# Patient Record
Sex: Male | Born: 1960 | Race: White | Hispanic: No | Marital: Married | Smoking: Former smoker
Health system: Southern US, Community
[De-identification: ages and names within clinical notes are randomized; demographics above are authoritative.]

## PROBLEM LIST (undated history)

## (undated) DIAGNOSIS — F419 Anxiety disorder, unspecified: Secondary | ICD-10-CM

## (undated) DIAGNOSIS — F32A Depression, unspecified: Secondary | ICD-10-CM

## (undated) DIAGNOSIS — F329 Major depressive disorder, single episode, unspecified: Secondary | ICD-10-CM

## (undated) DIAGNOSIS — I10 Essential (primary) hypertension: Secondary | ICD-10-CM

## (undated) DIAGNOSIS — R454 Irritability and anger: Secondary | ICD-10-CM

## (undated) HISTORY — DX: Depression, unspecified: F32.A

## (undated) HISTORY — PX: APPENDECTOMY: SHX54

## (undated) HISTORY — PX: TONSILLECTOMY: SUR1361

## (undated) HISTORY — DX: Irritability and anger: R45.4

## (undated) HISTORY — DX: Anxiety disorder, unspecified: F41.9

## (undated) HISTORY — DX: Major depressive disorder, single episode, unspecified: F32.9

## (undated) HISTORY — PX: COSMETIC SURGERY: SHX468

---

## 2010-11-25 ENCOUNTER — Encounter: Payer: Self-pay | Admitting: Family Medicine

## 2010-11-25 ENCOUNTER — Ambulatory Visit
Admission: RE | Admit: 2010-11-25 | Discharge: 2010-11-25 | Payer: Self-pay | Source: Home / Self Care | Admitting: Family Medicine

## 2010-11-25 DIAGNOSIS — I10 Essential (primary) hypertension: Secondary | ICD-10-CM | POA: Insufficient documentation

## 2010-11-27 ENCOUNTER — Telehealth (INDEPENDENT_AMBULATORY_CARE_PROVIDER_SITE_OTHER): Payer: Self-pay | Admitting: *Deleted

## 2010-12-05 NOTE — Progress Notes (Signed)
  Phone Note Outgoing Call Call back at [facility] Phone [phone]   Call placed by: Lajean Saver RN,  November 27, 2010 2:31 PM Call placed to: Patient Summary of Call: Callback: No answer. Message left with reason for call and call back with any questions or concerns.

## 2010-12-05 NOTE — Assessment & Plan Note (Signed)
Summary: SOB/CHEST PAIN (rm 5)   Vital Signs:  Patient Profile:   50 Years Old Male CC:      intermittent SOB x 2 weeks , right sided chest disomfort x this AM Height:     76 inches Weight:      283 pounds O2 Sat:      95 % O2 treatment:    Room Air Temp:     98.8 degrees F oral Pulse rate:   96 / minute Resp:     18 per minute BP sitting:   165 / 98  (left arm) Cuff size:   large  Vitals Entered By: Lajean Saver RN (November 25, 2010 9:59 AM)                  Updated Prior Medication List: FISH OIL 1000 MG CAPS (OMEGA-3 FATTY ACIDS) once daily MULTIVITAMINS  TABS (MULTIPLE VITAMIN)   Current Allergies: No known allergies History of Present Illness Chief Complaint: intermittent SOB x 2 weeks , right sided chest disomfort x this AM History of Present Illness:  Subjective:  Patient complains of 2 week history of vague right sided anterior chest discomfort that is most apparent in the morning after he does a cardio workout at home.  He describes the discomfort as a restriction when inhaling, almost like "breathing cold air."  No shortness of breath, however, and the discomfort does not limiti his activity.  No recent URI, and no trauma to the chest.  No cough.  No fevers, chills, and sweats.  No GI or GU symptoms.   Quit smoking about 5 years ago. Past history of pneumonia about 10 years ago.  No pain with physical activity.  REVIEW OF SYSTEMS Constitutional Symptoms      Denies fever, chills, night sweats, weight loss, weight gain, and fatigue.  Eyes       Denies change in vision, eye pain, eye discharge, glasses, contact lenses, and eye surgery. Ear/Nose/Throat/Mouth       Denies hearing loss/aids, change in hearing, ear pain, ear discharge, dizziness, frequent runny nose, frequent nose bleeds, sinus problems, sore throat, hoarseness, and tooth pain or bleeding.  Respiratory       Complains of shortness of breath.      Denies dry cough, productive cough, wheezing, asthma,  bronchitis, and emphysema/COPD.  Cardiovascular       Complains of chest pain.      Denies murmurs and tires easily with exhertion.    Gastrointestinal       Denies stomach pain, nausea/vomiting, diarrhea, constipation, blood in bowel movements, and indigestion. Genitourniary       Denies painful urination, kidney stones, and loss of urinary control. Neurological       Denies paralysis, seizures, and fainting/blackouts. Musculoskeletal       Denies muscle pain, joint pain, joint stiffness, decreased range of motion, redness, swelling, muscle weakness, and gout.  Skin       Denies bruising, unusual mles/lumps or sores, and hair/skin or nail changes.  Psych       Denies mood changes, temper/anger issues, anxiety/stress, speech problems, depression, and sleep problems. Other Comments: SOB intermittent x 2 weeks. Chest discomfort x this AM, "Chest pain feels like im breathing in cold air"   Past History:  Past Medical History: Hx of PNA  Past Surgical History: Appendectomy Tonsillectomy  Family History: Family History Breast cancer 1st degree relative <50- mother Family History of Stroke M 1st degree relative <50- father  Social History:  Occupation: Tobacco sales rep Married Alcohol use-no Drug use-no 2 grown children Quit smoking 5 years ago, smoked 20 yearsDrug Use:  no   Objective:  Appearance:  Patient appears healthy, stated age, and in no acute distress  Skin:  warm and dry; no rash Eyes:  Pupils are equal, round, and reactive to light and accomdation.  Extraocular movement is intact.  Conjunctivae are not inflamed.  Mouth:  moist mucous membranes  Neck:  Supple.  No adenopathy is present.  No thyromegaly is present  Lungs:  Clear to auscultation.  Breath sounds are equal.  Chest:  Distinct tenderness right anterior chest to the right of sternum, generally over the right pectoralis (but beneath the muscle over ribs).  No tenderness directly over sternum. Heart:   Regular rate and rhythm without murmurs, rubs, or gallops.  Abdomen:  Nontender without masses or hepatosplenomegaly.  Bowel sounds are present.  No CVA or flank tenderness.  Extremities:  No edema.  No calf tenderness. EKG:  Normal Chest X-ray:  No acute cardiopulmonary findings. Assessment New Problems: ELEVATED BP READING WITHOUT DX HYPERTENSION (ICD-796.2) COSTOCHONDRITIS (ICD-733.6) CHEST PAIN, RIGHT (ICD-786.50) FAMILY HISTORY BREAST CANCER 1ST DEGREE RELATIVE <50 (ICD-V16.3)  MUSCULOSKELETAL PAIN  Plan New Orders: T-Chest x-ray, 2 views [71020] EKG w/ Interpretation [93000] Pulse Oximetry (single measurment) [94760] New Patient Level IV [37628] Planning Comments:   Recommend Ibuprofen 200mg , 4 tabs every 8 hours with food, or Aleve 2 tabs every 12 hours with food for 3 to 5 days.  Apply heating pad 2 or 3 times daily.  Given a Water quality scientist patient information and instruction sheet on topic costochondritis Follow-up with PCP for BP   The patient and/or caregiver has been counseled thoroughly with regard to medications prescribed including dosage, schedule, interactions, rationale for use, and possible side effects and they verbalize understanding.  Diagnoses and expected course of recovery discussed and will return if not improved as expected or if the condition worsens. Patient and/or caregiver verbalized understanding.   Orders Added: 1)  T-Chest x-ray, 2 views [71020] 2)  EKG w/ Interpretation [93000] 3)  Pulse Oximetry (single measurment) [94760] 4)  New Patient Level IV [31517]

## 2011-08-27 ENCOUNTER — Ambulatory Visit (INDEPENDENT_AMBULATORY_CARE_PROVIDER_SITE_OTHER): Payer: 59 | Admitting: Psychiatry

## 2011-08-27 DIAGNOSIS — F3130 Bipolar disorder, current episode depressed, mild or moderate severity, unspecified: Secondary | ICD-10-CM

## 2011-08-28 ENCOUNTER — Encounter (HOSPITAL_COMMUNITY): Payer: Self-pay | Admitting: Psychiatry

## 2011-09-05 ENCOUNTER — Encounter (HOSPITAL_COMMUNITY): Payer: Self-pay | Admitting: Psychology

## 2011-09-10 ENCOUNTER — Ambulatory Visit (INDEPENDENT_AMBULATORY_CARE_PROVIDER_SITE_OTHER): Payer: 59 | Admitting: Psychiatry

## 2011-09-10 ENCOUNTER — Encounter (HOSPITAL_COMMUNITY): Payer: Self-pay | Admitting: Psychiatry

## 2011-09-10 VITALS — BP 118/87 | HR 67 | Ht 76.0 in | Wt 269.0 lb

## 2011-09-10 DIAGNOSIS — F4010 Social phobia, unspecified: Secondary | ICD-10-CM

## 2011-09-10 DIAGNOSIS — F3130 Bipolar disorder, current episode depressed, mild or moderate severity, unspecified: Secondary | ICD-10-CM

## 2011-09-10 DIAGNOSIS — F429 Obsessive-compulsive disorder, unspecified: Secondary | ICD-10-CM

## 2011-09-10 NOTE — Patient Instructions (Signed)
Take Seroquel XR daily and report any adverse effects.  Try to maintain small portions and drink more water.  Use [facility] hotlines if needed, card is given.  Schedule appointment with therapist, Serafina Mitchell.

## 2011-09-10 NOTE — Progress Notes (Signed)
Stephen Myers has been taking his medication and reports that he is less anxious to enter the workplace.  He has no recurrent shortness of breath and hesitation to go into the stores.   He actually went to store he avoided and entered without anxiety.  He says he does feel better.    He has talked with his wife and brother to review family hisory.  He says he is in a better mood today. Yet he continues to have some residual thoughts, passive that all would be better off without him.   He agrees that therapy sessions would help him.   He does not complain of side iffects.  He does have some drowsy feeling and notices that his level of alertness is improving.

## 2011-09-24 ENCOUNTER — Ambulatory Visit (INDEPENDENT_AMBULATORY_CARE_PROVIDER_SITE_OTHER): Payer: 59 | Admitting: Psychiatry

## 2011-09-24 ENCOUNTER — Encounter (HOSPITAL_COMMUNITY): Payer: Self-pay | Admitting: Psychiatry

## 2011-09-24 DIAGNOSIS — F5105 Insomnia due to other mental disorder: Secondary | ICD-10-CM

## 2011-09-24 DIAGNOSIS — F429 Obsessive-compulsive disorder, unspecified: Secondary | ICD-10-CM

## 2011-09-24 DIAGNOSIS — F489 Nonpsychotic mental disorder, unspecified: Secondary | ICD-10-CM

## 2011-09-24 MED ORDER — QUETIAPINE FUMARATE ER 50 MG PO TB24: 50.0000 mg | ORAL_TABLET | Freq: Every day | ORAL | Status: DC

## 2011-09-24 MED ORDER — FLUVOXAMINE MALEATE 50 MG PO TABS: 50.0000 mg | ORAL_TABLET | Freq: Every day | ORAL | Status: DC

## 2011-09-24 NOTE — Progress Notes (Signed)
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  He says the counting and repetitious thinking started again after thinking he had things under control  He discusses  OCD more freely and agrees with interest in trying medication.  He denies suicidal ideation.  He is calm has full affect and is worried about his wt.  He wants to loose some weight.

## 2011-09-24 NOTE — Patient Instructions (Addendum)
Discussion of  LUVOX Risks, Benefits and Alternative Treatments  are discussed and pt AGREE  Recall  It is important to report any suicidal thoughts to call office  or call the Crisis numbers you have been given  Reports and side effects   Continue therapy with CP and return in one month

## 2011-09-29 ENCOUNTER — Telehealth (HOSPITAL_COMMUNITY): Payer: Self-pay

## 2011-09-29 NOTE — Telephone Encounter (Signed)
Received notice that Stephen Myers did not receive medications at [facility].  These have been called in to the pharmacist" Luvox  Fluvoxamine 50 mg. 1 po Q am and Seroquel XR quetiapine 50 mg 1 po Q 8 pm.   They plan to call pt when ready for pick up.

## 2011-09-29 NOTE — Telephone Encounter (Signed)
Pt left message Friday 09/26/11 @ 12.12p stating that his medication refill from his appointment Wednesday was not at the pharmacy and would like [place] to please take care of this asap. Pt did not state which medication

## 2011-10-07 ENCOUNTER — Ambulatory Visit (HOSPITAL_COMMUNITY): Payer: 59 | Admitting: Behavioral Health

## 2011-10-29 ENCOUNTER — Ambulatory Visit (INDEPENDENT_AMBULATORY_CARE_PROVIDER_SITE_OTHER): Payer: 59 | Admitting: Behavioral Health

## 2011-10-29 DIAGNOSIS — F411 Generalized anxiety disorder: Secondary | ICD-10-CM

## 2011-10-30 ENCOUNTER — Encounter (HOSPITAL_COMMUNITY): Payer: Self-pay | Admitting: Psychiatry

## 2011-10-30 ENCOUNTER — Ambulatory Visit (INDEPENDENT_AMBULATORY_CARE_PROVIDER_SITE_OTHER): Payer: 59 | Admitting: Psychiatry

## 2011-10-30 DIAGNOSIS — F411 Generalized anxiety disorder: Secondary | ICD-10-CM

## 2011-10-30 DIAGNOSIS — F489 Nonpsychotic mental disorder, unspecified: Secondary | ICD-10-CM

## 2011-10-30 DIAGNOSIS — F429 Obsessive-compulsive disorder, unspecified: Secondary | ICD-10-CM

## 2011-10-30 DIAGNOSIS — F419 Anxiety disorder, unspecified: Secondary | ICD-10-CM | POA: Insufficient documentation

## 2011-10-30 MED ORDER — FLUVOXAMINE MALEATE 25 MG PO TABS: 25.0000 mg | ORAL_TABLET | Freq: Every day | ORAL | Status: DC

## 2011-10-30 MED ORDER — QUETIAPINE FUMARATE ER 50 MG PO TB24: 50.0000 mg | ORAL_TABLET | Freq: Every day | ORAL | Status: DC

## 2011-10-30 NOTE — Patient Instructions (Signed)
Please try to change in dose of Luvox with the change in taking it every morning. Continue the Seroquel as before and remember to call if necessary the medication for OCD can always be reconsidered. You have an appointment with CP and return to visit in 2 months.

## 2011-10-30 NOTE — Progress Notes (Signed)
Patient ID: Stephen Myers, male   DOB: 1961-10-11, 50 y.o.   MRN: [id number] [name] is here for followup evaluation. He reports that since he has been taking the Luvox and the Seroquel at night he has been more irritable during the day to he began taking half tablet of Luvox and believes that might be better. He has a few days off so he agrees to try the 25 mg Luvox in the morning and take the Seroquel 50 mg at night 2 hours before bedtime. He reports irritability is instantaneous, spontaneous and even hostile toward other drivers he is agreeing to call if he has any adverse effects or the irritability persists after cutting the dose in half. [name] reports that the Luvox has been helpful i am decreasing the urge to counted and recounted. This was especially helpful at the end of the year when he had so many assignments to wrap a piece working with Serafina Mitchell on family matters and and will continue in therapy with CP

## 2011-10-31 ENCOUNTER — Encounter (HOSPITAL_COMMUNITY): Payer: Self-pay | Admitting: Behavioral Health

## 2011-10-31 NOTE — Progress Notes (Deleted)
   THERAPIST PROGRESS NOTE  Session Time: ***  Participation Level: {BHH PARTICIPATION LEVEL:22264}  Behavioral Response: {Appearance:22683}{BHH LEVEL OF CONSCIOUSNESS:22305}{BHH MOOD:22306}  Type of Therapy: {CHL AMB BH Type of Therapy:21022741}  Treatment Goals addressed: {CHL AMB BH Treatment Goals Addressed:21022754}  Interventions: {CHL AMB BH Type of Intervention:21022753}  Summary: Stephen Myers is a 50 y.o. male who presents with ***.   Suicidal/Homicidal: {BHH YES OR NO:22294}{yes/no/with/without intent/plan:22693}  Therapist Response: ***  Plan: Return again in *** weeks.  Diagnosis: Axis I: {psych axis 1:31909}    Axis II: {psych axis 2:31910}    [name], [facility] 10/31/2011
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
## 2011-11-11 ENCOUNTER — Emergency Department
Admission: EM | Admit: 2011-11-11 | Discharge: 2011-11-11 | Disposition: A | Discharge status: Home / Self Care | Payer: 59 | Source: Home / Self Care | Attending: Emergency Medicine | Admitting: Emergency Medicine

## 2011-11-11 ENCOUNTER — Encounter: Payer: Self-pay | Admitting: Emergency Medicine

## 2011-11-11 ENCOUNTER — Emergency Department
Admit: 2011-11-11 | Discharge: 2011-11-11 | Disposition: A | Discharge status: Home / Self Care | Payer: 59 | Attending: Emergency Medicine | Admitting: Emergency Medicine

## 2011-11-11 DIAGNOSIS — M25569 Pain in unspecified knee: Secondary | ICD-10-CM

## 2011-11-11 DIAGNOSIS — M25561 Pain in right knee: Secondary | ICD-10-CM

## 2011-11-11 MED ORDER — NAPROXEN 500 MG PO TABS: 500.0000 mg | ORAL_TABLET | Freq: Two times a day (BID) | ORAL | Status: DC | PRN

## 2011-11-11 NOTE — ED Notes (Signed)
Rt knee pain x 1 week, swelling, "gives out on me", no trauma

## 2011-11-11 NOTE — ED Provider Notes (Signed)
History     CSN: [id number]  Arrival date & time 11/11/11  4098   First MD Initiated Contact with Patient 11/11/11 [phone]      Chief Complaint  Patient presents with  . Knee Pain    (Consider location/radiation/quality/duration/timing/severity/associated sxs/prior treatment) HPI This patient presents today with knee pain for the last week. He does not recall any injury or trauma, however he frequently works on his feet and walks around. He was a former high school quarterback and athlete so he may have hurt his knee in the past. He has been using some Aleve which helps. He has not been using any ice. He states that the pain is worse with certain movements although he does not know which movements those are. He feels that his knee is giving out sometimes. No locking. Perhaps some mild swelling.  Past Medical History  Diagnosis Date  . Irritability and anger     symptoms at home and work  . Anxiety   . Depression     History reviewed. No pertinent past surgical history.  Family History  Problem Relation Age of Onset  . Depression Mother   . Bipolar disorder Father   . Depression Brother   . Depression Maternal Grandmother   . Bipolar disorder Daughter     History  Substance Use Topics  . Smoking status: Former Smoker -- 0.0 packs/day for 15 years    Types: Cigarettes    Quit date: 11/03/2000  . Smokeless tobacco: Never Used  . Alcohol Use: No     Sneaks scotch (2-3 mixed) per Dr. Ferol Luz assessment 08/27/2011      Review of Systems  Allergies  Review of patient's allergies indicates no known allergies.  Home Medications   Current Outpatient Rx  Name Route Sig Dispense Refill  . FLUVOXAMINE MALEATE 25 MG PO TABS Oral Take 1 tablet (25 mg total) by mouth daily after breakfast. 30 tablet 1  . NAPROXEN 500 MG PO TABS Oral Take 1 tablet (500 mg total) by mouth 2 (two) times daily as needed. 30 tablet 0  . QUETIAPINE FUMARATE ER 50 MG PO TB24 Oral Take 1 tablet (50 mg  total) by mouth daily. Q8pm 30 each 1    BP 147/88  Pulse 77  Temp(Src) 97.8 F (36.6 C) (Oral)  Resp 16  Ht 6\' 4"  (1.93 m)  Wt 274 lb (124.286 kg)  BMI 33.35 kg/m2  SpO2 96%  Physical Exam  Nursing note and vitals reviewed. Constitutional: He is oriented to person, place, and time. He appears well-developed and well-nourished.  HENT:  Head: Normocephalic and atraumatic.  Eyes: No scleral icterus.  Neck: Neck supple.  Cardiovascular: Regular rhythm and normal heart sounds.   Pulmonary/Chest: Effort normal and breath sounds normal. No respiratory distress.  Musculoskeletal:       R knee: Full range of motion, no effusion, no ecchymoses, Lachmans normal, Anterior & posterior drawer normal, McMurrays slight pain and Thessaly test painful, Varus & valgus stress normal.  Patella freely mobile, Clarks compression test is painful.  He has tenderness at the medial joint line, mildly at the lateral joint line, mildly along the patellar tendon and at the inferior patellar facets.  Good alignment. Distal neurovascular status is intact.   Neurological: He is alert and oriented to person, place, and time.  Skin: Skin is warm and dry.  Psychiatric: He has a normal mood and affect. His speech is normal.    ED Course  Procedures (including critical care  time)  Labs Reviewed - No data to display Dg Knee Ap/lat W/sunrise Right  11/11/2011  *RADIOLOGY REPORT*  Clinical Data: Right knee pain, no injury  DG KNEE - 3 VIEWS  Comparison: None.  Findings: There is only minimal loss of medial joint space on the frontal view.  No fracture or effusion is seen.  The patella is minimally lateral in position within the patellofemoral groove.  IMPRESSION:  1.  Question of mild loss of joint space medially. 2.  The patella is very slightly laterally positioned within the patellofemoral groove.  Original Report Authenticated By: Juline Patch, M.D.     1. Right knee pain       MDM   An x-ray was ordered  and read by radiology as above. I believe his pain is likely due to multiple reasons. Due to his age he likely has a meniscal or cartilaginous pathology. He also seems to have some tenderness anteriorly and may have a component of patellar tendinitis. I have given her a prescription for naproxen and I have referred him to Dr. Margaretha Sheffield this week for evaluation.  .Encourage rest, ice, compression with ACE bandage and/or a brace, and elevation of injured body part.    Lily Kocher, MD 11/11/11 [phone]

## 2011-11-13 ENCOUNTER — Other Ambulatory Visit: Payer: Self-pay | Admitting: Sports Medicine

## 2011-11-13 DIAGNOSIS — M25561 Pain in right knee: Secondary | ICD-10-CM

## 2011-11-14 ENCOUNTER — Ambulatory Visit (INDEPENDENT_AMBULATORY_CARE_PROVIDER_SITE_OTHER): Payer: 59 | Admitting: Behavioral Health

## 2011-11-14 DIAGNOSIS — F411 Generalized anxiety disorder: Secondary | ICD-10-CM

## 2011-11-14 NOTE — Progress Notes (Unsigned)
THERAPIST PROGRESS NOTE  Session Time: 2:00  Participation Level: Active  Behavioral Response: Well GroomedAlertDepressed/anxious  Type of Therapy: Individual Therapy  Treatment Goals addressed: Coping  Interventions: CBT  Summary: Stephen Myers is a 51 y.o. male who presents with anxiety.   Suicidal/Homicidal: Nowithout intent/plan  Therapist Response: The client indicated that his life had been complicated by some knee pain over the past week or so. He indicated that he has had checked out with a surgeon and will have and him are high in one week to surgeon suspects that it may be a porn meniscus. The client indicates that he is in significant pain when he is on it or walking although he gets relief when sitting or lying down. He certainly wants to avoid surgery and indicates that he feels his surgical do all he can to avoid that. He indicated that has not necessarily increases anxiety other than the fact that he feels he can't complete his job as well because he's taken some half days off this week because of the pain. He indicated that his boss is very supportive and is off her to cover for him when needed at the client indicates he was raised with the idea that you do your job and although anybody help you. We talked her. Time of what that would like to the patient to be in a place where he would have to allow someone to help him out. The client also begin to explore some of his daughters history and how that has been a stressor for him. The client's daughter had some issues with taking prescription drugs or non prescribed her while a junior indoor senior in high school as well as some other legal issues which have been stressful for the client and his wife. He indicates that the client has done better since graduating high school and this makes him poor decisions recently. He indicates that he still has a good relationship with his daughter and lets her know daily he loves her but  that there is Stephen Myers tension between he and his daughter. He indicates that his wife appears to be able to relate to her better at this point time he indicates that she is dealing with some body image issues and is very tearful at times and cited that on Sunday morning the past week he can share her sobbing from downstairs and his wife went up and spoke to her. The client is seeing a counselor in [place] specializes in body image issues. The client indicated that he is trying to read his mind around the recent diagnosis of bipolar disorder. He indicates that he is researching a line and reading it makes him recognizes that he probably has been dealing with those symptoms for a long time. He indicates that he drank heavily until about 8 years ago and feels that was directly connected to his mental health issues. He indicated that his wife told him that if he continued to drink he would have to get out. He indicated that he packed his bags was ready to go in realize what a mistake he was making. He indicates that he has an occasional drink now but that is rare has a prayer desire to drink heavily like he did then. We talked about how if his diagnosis was bipolar that was his choice and that is something that the medication can correct. I praised him for his ability to stop drinking 8 years ago and for managing to maintain  polite that he has for such a long period time dealing with mental health issues that he has. We talked about using a CBT to manage his anxiety and thought replacements with a negative thoughts pop into his head. He indicates that he has thoughts about how he used to would have fallen into drinking heavily when going to crisis as he has been recently. We talked about how he can replace those thoughts using CBT. He stressed again that he is not going to start drinking he had. He did contract for safety indicating is not homicidal or suicidal and praised his wife for support and encouragement and  being his strength. He also talked about how he learned to use humor in dealing with his father and how that helps him cope with certain stressors but also recognizes that times he uses as a crutch or as avoidance techniques. We will continue to explore in the next session.  Plan: Return again in 2 weeks.  Diagnosis: Axis I: 300.02    Axis II: Deferred    [name], [facility] 11/14/2011

## 2011-11-17 ENCOUNTER — Telehealth (HOSPITAL_COMMUNITY): Payer: Self-pay

## 2011-11-17 ENCOUNTER — Other Ambulatory Visit (HOSPITAL_COMMUNITY): Payer: Self-pay | Admitting: Psychiatry

## 2011-11-19 ENCOUNTER — Ambulatory Visit (HOSPITAL_COMMUNITY): Payer: Self-pay | Admitting: Behavioral Health

## 2011-11-22 ENCOUNTER — Ambulatory Visit
Admission: RE | Admit: 2011-11-22 | Discharge: 2011-11-22 | Disposition: A | Discharge status: Home / Self Care | Payer: 59 | Source: Ambulatory Visit | Attending: Sports Medicine | Admitting: Sports Medicine

## 2011-11-22 DIAGNOSIS — M25561 Pain in right knee: Secondary | ICD-10-CM

## 2011-11-27 ENCOUNTER — Other Ambulatory Visit (HOSPITAL_COMMUNITY): Payer: Self-pay | Admitting: Psychiatry

## 2011-11-27 DIAGNOSIS — R454 Irritability and anger: Secondary | ICD-10-CM

## 2011-11-28 ENCOUNTER — Other Ambulatory Visit (HOSPITAL_COMMUNITY): Payer: Self-pay | Admitting: Psychiatry

## 2011-11-28 DIAGNOSIS — F429 Obsessive-compulsive disorder, unspecified: Secondary | ICD-10-CM

## 2011-11-28 DIAGNOSIS — F489 Nonpsychotic mental disorder, unspecified: Secondary | ICD-10-CM

## 2011-11-28 MED ORDER — FLUVOXAMINE MALEATE 25 MG PO TABS: 25.0000 mg | ORAL_TABLET | Freq: Every day | ORAL | Status: DC

## 2011-11-28 MED ORDER — QUETIAPINE FUMARATE ER 50 MG PO TB24: 50.0000 mg | ORAL_TABLET | Freq: Every day | ORAL | Status: DC

## 2011-11-28 NOTE — Progress Notes (Signed)
Pt requests medication refill. 

## 2011-11-28 NOTE — Telephone Encounter (Signed)
pT REQUESTS REFILL BEFORE APPT TIME

## 2011-12-12 ENCOUNTER — Ambulatory Visit (INDEPENDENT_AMBULATORY_CARE_PROVIDER_SITE_OTHER): Payer: 59 | Admitting: Behavioral Health

## 2011-12-12 DIAGNOSIS — F329 Major depressive disorder, single episode, unspecified: Secondary | ICD-10-CM

## 2011-12-12 DIAGNOSIS — F411 Generalized anxiety disorder: Secondary | ICD-10-CM

## 2011-12-12 DIAGNOSIS — F419 Anxiety disorder, unspecified: Secondary | ICD-10-CM

## 2011-12-15 ENCOUNTER — Encounter (HOSPITAL_COMMUNITY): Payer: Self-pay | Admitting: Behavioral Health

## 2011-12-15 NOTE — Progress Notes (Signed)
THERAPIST PROGRESS NOTE  Session Time: 2:00  Participation Level: Active  Behavioral Response: Well GroomedAlertsad  Type of Therapy: Individual Therapy  Treatment Goals addressed: Coping  Interventions: CBT  Summary: Stephen Myers is a 51 y.o. male who presents with anxiety and depression.   Suicidal/Homicidal: Nowithout intent/plan  Therapist Response: The client indicated that the past 2 weeks of his life had been incredibly difficult. He indicated that about 2 weeks ago he and his wife heard his 18 year old daughter weeping loudly in her room. He indicated that his wife went to check on her and that she had cut her arms and her legs and appear to be suicidal. He indicated that they took her to the hospital where she was admitted to come behavioral health for one week. He indicated that almost overwhelmed he and his wife to see his daughter get to the point she hurt herself and thought about killing herself. He indicated that since she has been home for the past few days but she seems to be her old self again and that their relationship has been better. He indicated that he and his wife visited every minute of time that they were allowed to visit and that he began to see his relationship with his daughter change. He indicated that his daughter saw him weeping which he had not seen very many times in her lifetime. He indicated that he feels that she may be on the right combination of medication now which is contributing to her change in behavior and personality over the past few days but is still somewhat guarded in knowing if this is the right 6 because they have been up and down with the daughter over the past several years. The client was clearly hurts and saddened by the daughter's action was tearful multiple times throughout the session. He did indicate that throughout this time he had thoughts of suicide more in terms of what it would be like not to have to deal with the anxiety  that he started dealing with the fact that he came close to seeing his daughter her badly and the possibility of her killing herself. He indicated that he had no plan in place and has no intention of killing himself he had more suicidal thoughts over the past few weeks that he has had in the past. He did indicate that his anxiety is better and did not know if it was because he was so focused on his daughter that he was and focusing on himself. He did feel the medications were making a difference in helping him stay calm and that his sleep had been a little bit better. He indicated that for the past 2 weeks he has been completely exhausted and did not know what to do to make it better. We talked about finding time for himself now other than that playing golf, going to the driving range, reading a book, etc. I suggested that his wife see a counselor for the difficulty she is having going through with this. The client indicated that his daughter is a very good therapist in [place] who helps with issues specifically related to her. The client indicated on a positive note that even though this is in painful he sees a changing his relationship with his daughter in a very positive way which in part is that a significant contributor to his anxiety over the past few months. He indicated that they hugged for 40 so that they love each other more as  well as spending quality time together. He indicated that he recognized while this that he had been overbearing because he was constantly checking on her which communicated a message that he did not trust her. He indicated that he recognizes he did not trust her but did not realize effect it had on her behavior. The client again did contract for safety.  Plan: Return again in 2 weeks.  Diagnosis: Axis I: 300.02/311    Axis II: Deferred    Truly Stankiewicz M, LPC 12/15/2011 2:00

## 2011-12-22 ENCOUNTER — Encounter (HOSPITAL_COMMUNITY): Payer: Self-pay | Admitting: Behavioral Health

## 2011-12-22 ENCOUNTER — Ambulatory Visit (INDEPENDENT_AMBULATORY_CARE_PROVIDER_SITE_OTHER): Payer: 59 | Admitting: Behavioral Health

## 2011-12-22 DIAGNOSIS — F331 Major depressive disorder, recurrent, moderate: Secondary | ICD-10-CM

## 2011-12-22 NOTE — Progress Notes (Signed)
THERAPIST PROGRESS NOTE  Session Time: 8:00  Participation Level: Active  Behavioral Response: NeatAlertDepressed  Type of Therapy: Individual Therapy  Treatment Goals addressed: Coping  Interventions: CBT  Summary: Stephen Myers is a 51 y.o. male who presents with depression and anxiety.   Suicidal/Homicidal: Nowithout intent/plan  Therapist Response: The client entered the session indicating that he had a difficult weekend. He indicated that his daughter who he feels is doing better and feels that she is on the right medication has had some struggles with her maternal uncle. Evidently clients daughter the clients paternal uncle were close and the maternal uncle has not responded to in the daughter was in the hospital had upset her. The father indicated that the daughter has dealt with a fairly well but that led to a conversation with his wife in which she indicated that she felt he favored his family more than hers. He indicated that for the past 2 years he has attempted to get both of the families together he thought that it all well and recognizes that this may just be that his wife was hurting from what, with her daughter. He did indicate that he . He indicates that he is not a fan of the maternal uncle who the daughter was concerned with because the maternal uncle per the clients report is invested as long as he got engaged with somebody but is always sees dating a married to someone else forgets everyone else around him. I asked the client how he was dealing with the anxiety and missed all of his. He indicated that he is still had some episodes in which he could not get out of the car. 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 He indicates as he did in the previous session that his motivation for work is down significantly and that even though the medication is helping him sleep well he feels  exhausted all of the time. He indicated that he has become more irritable and is using more profanity than he has ever used which is completely unlike him. He indicates that he gets up and goes to work because he knows the 20 supposed to do but he has no interest pets that his bosses were supportive of him the client did indicate that he realizes he is probably hard on himself and anyone else is and that he still is probably doing a good job but not up to his standards. I told the client of thought he was depressed based on everything that he told me and that we probably need to speak to his Dr. about his medication. He will see Dr. Baron Sane next week. He did contract for safety indicating that he is not suicidal. All of the symptoms point to depression and we talked about the difference in depression and bipolar disorder as well as depression as a part of bipolar disorder. He indicated that looking back he feels he is had some manic episodes in terms of spending money, alcohol. I told him I wasn't sure as if the depression was a part of a bipolar disorder at this point time or that which his depression but that I think we need to address it with medication evaluation with Dr. Baron Sane. I will continue to process this with the client in the next session. Diagnosis: Axis I: 296.32    Axis II: Deferred  [name], [facility] 12/22/2011

## 2011-12-30 ENCOUNTER — Ambulatory Visit (HOSPITAL_COMMUNITY): Payer: Self-pay | Admitting: Behavioral Health

## 2011-12-31 ENCOUNTER — Ambulatory Visit (HOSPITAL_COMMUNITY): Payer: Self-pay | Admitting: Psychiatry

## 2011-12-31 ENCOUNTER — Encounter (HOSPITAL_COMMUNITY): Payer: Self-pay | Admitting: Psychiatry

## 2011-12-31 ENCOUNTER — Ambulatory Visit (INDEPENDENT_AMBULATORY_CARE_PROVIDER_SITE_OTHER): Payer: 59 | Admitting: Psychiatry

## 2011-12-31 VITALS — BP 121/87 | HR 80 | Ht 76.0 in | Wt 270.0 lb

## 2011-12-31 DIAGNOSIS — F329 Major depressive disorder, single episode, unspecified: Secondary | ICD-10-CM

## 2011-12-31 DIAGNOSIS — R454 Irritability and anger: Secondary | ICD-10-CM

## 2011-12-31 DIAGNOSIS — F489 Nonpsychotic mental disorder, unspecified: Secondary | ICD-10-CM

## 2011-12-31 DIAGNOSIS — F5105 Insomnia due to other mental disorder: Secondary | ICD-10-CM

## 2011-12-31 DIAGNOSIS — F3289 Other specified depressive episodes: Secondary | ICD-10-CM

## 2011-12-31 MED ORDER — CLOMIPRAMINE HCL 25 MG PO CAPS: 25.0000 mg | ORAL_CAPSULE | Freq: Every day | ORAL | Status: DC

## 2011-12-31 MED ORDER — QUETIAPINE FUMARATE ER 50 MG PO TB24: 50.0000 mg | ORAL_TABLET | Freq: Every day | ORAL | Status: DC

## 2011-12-31 NOTE — Progress Notes (Signed)
Patient ID: Stephen Myers, male   DOB: 1961/03/26, 51 y.o.   MRN: [id number] Stephen Myers enters the office with a very grim expression. He is very guarded and says that he is having a really bad time. He woke around 3 in the morning and until he was leaving the house he was ruminating, obsessing about his schedule and about finances. He feels very badly that his daughter Stephen Myers had to be an inpatient at the psychiatric unit. He said he was ashamed and then ashamed of being ashamed that he was unable to tell his family or his boss what happened. He had a lot of conflict between being there for her in getting his work done. 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 He says there are times when he can even go in.  He has a lot of difficulty explaining his thoughts and emotions but says the Luvox has not been helping and he wants to try a different medication. The options of other medications, risks benefits and alternative choices are discussed and a trial of Anafranil (clomipramine) is suggested. He is given the opportunity to try 50 mg in the evening, due to its sedation, and return in 2 weeks. He agrees to this plan. He also agrees to call the office and schedule an earlier appointment if he is not feeling well with this medication. A review of his cardiac status: Prior history of myocardial infarction, hypertension is reviewed. He denies any active problem or past diagnosis. He reports that he took his blood pressure this morning was 120/87 and this is a correction of the high blood pressure readings that have been recorded in this office. It is believed that this is a true reflection of his blood pressure because there has been increased Variability in the present equipment. Today, he is quite distraught and tearful. In part he is disappointed in himself and being  judgmental about his daughter's difficulties and I believe the stress of the situation has caused him to be more obsessive than usual. The general attitudes and stigma about mental illness are addressed and explained in terms of his own internal reaction to the mental health issues both of his daughter and himself. He is encouraged to continue therapy with CP and begin using a journal that he can bring in during his therapy sessions. He denies any suicidal thoughts but is encouraged to remember to call 911 if they become extreme. This may be difficult for him in terms of his daughter.

## 2011-12-31 NOTE — Patient Instructions (Signed)
Today we have discontinued the Luvox main and you have agreed to try the Anafranil (clomipramine) 50 mg at bedtime. We have discussed the risks benefits and alternative choices. Please remember to call the office if you're experiencing any adverse reaction to this new choice. Also call 911 the emergency department or behavioral Health Center in [place] [phone] dear experiencing extreme distress or suicidal/homicidal thoughts. You are given the medication Anafranil for 15 days please return in 2 weeks and continue to see CP for therapy.

## 2012-01-01 ENCOUNTER — Other Ambulatory Visit (HOSPITAL_COMMUNITY): Payer: Self-pay

## 2012-01-02 ENCOUNTER — Other Ambulatory Visit (HOSPITAL_COMMUNITY): Payer: Self-pay | Admitting: Psychiatry

## 2012-01-02 DIAGNOSIS — R4681 Obsessive-compulsive behavior: Secondary | ICD-10-CM

## 2012-01-02 MED ORDER — CLOMIPRAMINE HCL 25 MG PO CAPS: 25.0000 mg | ORAL_CAPSULE | Freq: Every day | ORAL | Status: DC

## 2012-01-09 ENCOUNTER — Ambulatory Visit (HOSPITAL_COMMUNITY): Payer: Self-pay | Admitting: Behavioral Health

## 2012-01-13 ENCOUNTER — Emergency Department: Admit: 2012-01-13 | Discharge: 2012-01-13 | Disposition: A | Discharge status: Home / Self Care | Payer: 59

## 2012-01-13 ENCOUNTER — Emergency Department
Admission: EM | Admit: 2012-01-13 | Discharge: 2012-01-13 | Disposition: A | Discharge status: Home Health | Payer: 59 | Source: Home / Self Care | Attending: Family Medicine | Admitting: Family Medicine

## 2012-01-13 ENCOUNTER — Encounter: Payer: Self-pay | Admitting: *Deleted

## 2012-01-13 DIAGNOSIS — S29012A Strain of muscle and tendon of back wall of thorax, initial encounter: Secondary | ICD-10-CM

## 2012-01-13 DIAGNOSIS — S63509A Unspecified sprain of unspecified wrist, initial encounter: Secondary | ICD-10-CM

## 2012-01-13 DIAGNOSIS — S239XXA Sprain of unspecified parts of thorax, initial encounter: Secondary | ICD-10-CM

## 2012-01-13 DIAGNOSIS — S2341XA Sprain of ribs, initial encounter: Secondary | ICD-10-CM

## 2012-01-13 DIAGNOSIS — S63502A Unspecified sprain of left wrist, initial encounter: Secondary | ICD-10-CM

## 2012-01-13 DIAGNOSIS — S29011A Strain of muscle and tendon of front wall of thorax, initial encounter: Secondary | ICD-10-CM

## 2012-01-13 NOTE — ED Provider Notes (Signed)
History     CSN: [id number]  Arrival date & time 01/13/12  8295   First MD Initiated Contact with Patient 01/13/12 [phone]      Chief Complaint  Patient presents with  . Wrist Pain      HPI Comments: Patient slipped on concrete last night, injuring his left wrist with subsequent pain/swelling.  This morning he noticed pain in his left shoulder blade area and left posterior ribs with movement of his left shoulder.  No shortness of breath or cough.  He had wrapped the wrist and applied ice packs.  Patient is a 51 y.o. male presenting with wrist pain and chest pain.  Wrist Pain This is a new problem. The current episode started yesterday. The problem occurs constantly. The problem has not changed since onset.Associated symptoms include chest pain. Pertinent negatives include no abdominal pain and no shortness of breath. Exacerbated by: bending left wrist. The symptoms are relieved by nothing. He has tried a cold compress for the symptoms. The treatment provided mild relief.  Chest Pain The chest pain began yesterday. Chest pain occurs intermittently. The chest pain is unchanged. The pain is associated with breathing (movement of left shoulder). The severity of the pain is mild. The quality of the pain is described as sharp. The pain does not radiate. Pertinent negatives for primary symptoms include no shortness of breath and no abdominal pain. He tried NSAIDs for the symptoms.     Past Medical History  Diagnosis Date  . Irritability and anger     symptoms at home and work  . Anxiety   . Depression     Past Surgical History  Procedure Date  . Appendectomy   . Tonsillectomy     Family History  Problem Relation Age of Onset  . Depression Mother   . Cancer Mother     breast  . Bipolar disorder Father   . Stroke Father   . Depression Brother   . Depression Maternal Grandmother   . Bipolar disorder Daughter     History  Substance Use Topics  . Smoking status: Former Smoker --  0.0 packs/day for 15 years    Types: Cigarettes    Quit date: 11/03/2000  . Smokeless tobacco: Never Used  . Alcohol Use: No     Sneaks scotch (2-3 mixed) per Dr. Ferol Luz assessment 08/27/2011      Review of Systems  Respiratory: Negative for shortness of breath.   Cardiovascular: Positive for chest pain.  Gastrointestinal: Negative for abdominal pain.  All other systems reviewed and are negative.    Allergies  Review of patient's allergies indicates no known allergies.  Home Medications   Current Outpatient Rx  Name Route Sig Dispense Refill  . CLOMIPRAMINE HCL 25 MG PO CAPS Oral Take 1 capsule (25 mg total) by mouth at bedtime. 30 capsule 0  . NAPROXEN 500 MG PO TABS Oral Take 1 tablet (500 mg total) by mouth 2 (two) times daily as needed. 30 tablet 0  . QUETIAPINE FUMARATE ER 50 MG PO TB24 Oral Take 1 tablet (50 mg total) by mouth at bedtime. 30 tablet 0    BP 138/83  Pulse 75  Temp(Src) 98.5 F (36.9 C) (Oral)  Resp 18  Ht 6\' 4"  (1.93 m)  Wt 268 lb (121.564 kg)  BMI 32.62 kg/m2  SpO2 97%  Physical Exam  Constitutional: He is oriented to person, place, and time. He appears well-developed and well-nourished. No distress.  Neck: Normal range of motion.  Cardiovascular: Normal rate and regular rhythm.   Pulmonary/Chest: Effort normal and breath sounds normal. He exhibits tenderness.       Tenderness left posterior/lateral ribs and intercostal spaces.  There is tenderness along the medial and inferior border of left scapula.  Musculoskeletal: He exhibits tenderness.       Left wrist: He exhibits decreased range of motion, tenderness, bony tenderness and swelling. He exhibits no effusion, no crepitus, no deformity and no laceration.       Arms:      There is tenderness dorsal wrist, and tenderness in the snuffbox area.  Distal Neurovascular function is intact.   Neurological: He is alert and oriented to person, place, and time.  Skin: Skin is warm and dry.    ED  Course  Procedures none  Labs Reviewed - No data to display Dg Chest 2 View  01/13/2012  *RADIOLOGY REPORT*  Clinical Data: Left posterior rib pain.  Fell yesterday.  CHEST - 2 VIEW  Comparison: Chest dated 11/25/2010.  Findings: Normal sized heart.  Clear lungs.  Minimal scoliosis.  No fracture or pneumothorax seen.  IMPRESSION: No acute abnormality.  Original Report Authenticated By: Darrol Angel, M.D.   Dg Ribs Unilateral Left  01/13/2012  *RADIOLOGY REPORT*  Clinical Data: Fall.  Evaluate for rib fracture.  LEFT RIBS - 2 VIEW  Comparison: 01/13/2012  Findings: No displaced rib fractures identified.  IMPRESSION:  1.  No rib fractures identified.  Original Report Authenticated By: Rosealee Albee, M.D.   Dg Wrist Complete Left  01/13/2012  *RADIOLOGY REPORT*  Clinical Data: Lateral left wrist pain.  Fell yesterday.  LEFT WRIST - COMPLETE 3+ VIEW  Comparison: None.  Findings: Normal appearing bones and soft tissues without fracture or dislocation.  IMPRESSION: Normal examination.  Original Report Authenticated By: Darrol Angel, M.D.     1. Left wrist sprain   2. Strain of rhomboid muscle   3. Intercostal muscle strain       MDM  Ace wrap applied to left wrist, followed by velcro splint. Apply ice pack for 30 to 45 minutes every 1 to 4 hours.  Continue until swelling decreases.  Wear ace wrap until swelling decreases.  Wear wrist splint for 5 to 7 days.  Begin range of motion exercises in 5 to 7 days.  [facility] Health information and instruction handouts given)  Continue Aleve. Followup with [facility] if not improving about two weeks.         Lattie Haw, MD 01/13/12 1044

## 2012-01-13 NOTE — Discharge Instructions (Signed)
Apply ice pack for 30 to 45 minutes every 1 to 4 hours.  Continue until swelling decreases.  Wear ace wrap until swelling decreases.  Wear wrist splint for 5 to 7 days.  Begin range of motion exercises in 5 to 7 days. Continue Aleve.

## 2012-01-13 NOTE — ED Notes (Signed)
Pt c/o LT wrist injury x last night, post fall. He has applied ice and taken aleve.

## 2012-01-14 ENCOUNTER — Ambulatory Visit (INDEPENDENT_AMBULATORY_CARE_PROVIDER_SITE_OTHER): Payer: Self-pay | Admitting: Psychiatry

## 2012-01-14 VITALS — HR 67 | Ht 76.0 in | Wt 262.0 lb

## 2012-01-14 DIAGNOSIS — R454 Irritability and anger: Secondary | ICD-10-CM

## 2012-01-14 DIAGNOSIS — F429 Obsessive-compulsive disorder, unspecified: Secondary | ICD-10-CM

## 2012-01-14 DIAGNOSIS — F4001 Agoraphobia with panic disorder: Secondary | ICD-10-CM

## 2012-01-14 MED ORDER — QUETIAPINE FUMARATE ER 50 MG PO TB24: 100.0000 mg | ORAL_TABLET | Freq: Every day | ORAL | Status: DC
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
## 2012-01-15 ENCOUNTER — Ambulatory Visit (INDEPENDENT_AMBULATORY_CARE_PROVIDER_SITE_OTHER): Payer: 59 | Admitting: Behavioral Health

## 2012-01-15 DIAGNOSIS — F411 Generalized anxiety disorder: Secondary | ICD-10-CM

## 2012-01-16 ENCOUNTER — Encounter (HOSPITAL_COMMUNITY): Payer: Self-pay | Admitting: Psychiatry

## 2012-01-16 ENCOUNTER — Encounter (HOSPITAL_COMMUNITY): Payer: Self-pay | Admitting: Behavioral Health
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
## 2012-01-20 ENCOUNTER — Ambulatory Visit (HOSPITAL_COMMUNITY): Payer: Self-pay | Admitting: Psychiatry

## 2012-01-23 ENCOUNTER — Ambulatory Visit (HOSPITAL_COMMUNITY): Payer: Self-pay | Admitting: Behavioral Health

## 2012-01-24 ENCOUNTER — Other Ambulatory Visit (HOSPITAL_COMMUNITY): Payer: Self-pay | Admitting: Psychiatry

## 2012-01-27 ENCOUNTER — Ambulatory Visit (INDEPENDENT_AMBULATORY_CARE_PROVIDER_SITE_OTHER): Payer: 59 | Admitting: Behavioral Health

## 2012-01-27 DIAGNOSIS — F411 Generalized anxiety disorder: Secondary | ICD-10-CM

## 2012-01-28 ENCOUNTER — Encounter (HOSPITAL_COMMUNITY): Payer: Self-pay | Admitting: Behavioral Health
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
## 2012-02-12 ENCOUNTER — Ambulatory Visit (INDEPENDENT_AMBULATORY_CARE_PROVIDER_SITE_OTHER): Payer: 59 | Admitting: Behavioral Health

## 2012-02-12 DIAGNOSIS — F331 Major depressive disorder, recurrent, moderate: Secondary | ICD-10-CM

## 2012-02-13 ENCOUNTER — Encounter (HOSPITAL_COMMUNITY): Payer: Self-pay | Admitting: Behavioral Health

## 2012-02-13 NOTE — Progress Notes (Signed)
   THERAPIST PROGRESS NOTE  Session Time: 2:00  Participation Level: Active  Behavioral Response: NeatAlertAngry  Type of Therapy: Individual Therapy  Treatment Goals addressed: Coping  Interventions: CBT  Summary: Stephen Myers is a 51 y.o. male who presents with depression.   Suicidal/Homicidal: Nowithout intent/plan  Therapist Response: The client entered the session angry indicating that he thought his appointment was a different time we have his wife call and check the appointment he was told it was at 2:00 today. He indicated that he oriented extremely busy day and that coming here would push him to working late tonight. I allow him to work through his anger and apologize if there's any confusional on our part. He eventually did calm down and I praised him for working through that. The client indicated that he didn't think about what it positive his father told him. He did indicate that he at least preached determination and a good effort even if his father did not model (. He also indicated that his father always had been church since and although that had a negative impact on the client initially he is thankful because he is active in a church and reconnecting with his faith. The client indicated that his Seroquel is helping him sleep but that he still wakes up sluggish in the morning and has a hard time getting going. He indicated that prior to the Seroquel he was a morning person was up at 5 or 5:30 working out and getting ready to go to work and does not even feel like doing that now. He appreciates that he sleeping well but feels like no matter how much he sleeps he still is not restful. He also described a lack of motivation and lack of interest. He agreed with my assessment like he is depressed and may need to be addressed with a different medication. He indicated that he does not want to take medicine and at times it makes him feel like he can't fix the situation. I reminded him  that medication with him to the crisis and that he did not have to be on medication forever but that with depression and needs to be treated medically as well as do therapy. He was not exactly sure when his next appointment with Dr. Demetrius Charity. was but said that he got any worse he will call Dr. Demetrius Charity. before his appointment. He did indicate that in the past he has had suicidal thoughts but he has no suicidal thoughts now and is not suicidal. Plan: Return again in 2 weeks.  Axis II: Deferred    [name], [facility] 02/13/2012

## 2012-02-16 ENCOUNTER — Encounter (HOSPITAL_COMMUNITY): Payer: Self-pay | Admitting: Psychiatry

## 2012-02-16 ENCOUNTER — Ambulatory Visit (INDEPENDENT_AMBULATORY_CARE_PROVIDER_SITE_OTHER): Payer: 59 | Admitting: Psychiatry

## 2012-02-16 VITALS — BP 145/95 | HR 72 | Ht 76.0 in | Wt 274.0 lb

## 2012-02-16 DIAGNOSIS — F339 Major depressive disorder, recurrent, unspecified: Secondary | ICD-10-CM

## 2012-02-16 DIAGNOSIS — F4001 Agoraphobia with panic disorder: Secondary | ICD-10-CM

## 2012-02-16 DIAGNOSIS — F429 Obsessive-compulsive disorder, unspecified: Secondary | ICD-10-CM

## 2012-02-16 MED ORDER — FLUOXETINE HCL 10 MG PO CAPS: ORAL_CAPSULE | ORAL | Status: DC
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
## 2012-02-18 DIAGNOSIS — F4310 Post-traumatic stress disorder, unspecified: Secondary | ICD-10-CM | POA: Insufficient documentation

## 2012-02-19 DIAGNOSIS — F4001 Agoraphobia with panic disorder: Secondary | ICD-10-CM | POA: Insufficient documentation

## 2012-02-19 MED ORDER — FLUOXETINE HCL 10 MG PO CAPS: ORAL_CAPSULE | ORAL | Status: DC

## 2012-02-19 MED ORDER — QUETIAPINE FUMARATE ER 50 MG PO TB24: 50.0000 mg | ORAL_TABLET | Freq: Every day | ORAL | Status: DC

## 2012-02-26 ENCOUNTER — Telehealth (HOSPITAL_COMMUNITY): Payer: Self-pay | Admitting: Psychiatry

## 2012-02-26 NOTE — Telephone Encounter (Signed)
Patient started Prozac 10 mg for the past 6 days, and reports no side effects,  Problems, or manic symptoms.    PLAN:  1. Will increase Prozac to 20 mg, and initiate further taper of Seroquel to 50 mg every other day.  Patient asked to call in one week to report results. Patient asked to monitor for manic symptoms.

## 2012-03-04 ENCOUNTER — Other Ambulatory Visit (HOSPITAL_COMMUNITY): Payer: Self-pay | Admitting: Psychiatry

## 2012-03-04 ENCOUNTER — Ambulatory Visit (INDEPENDENT_AMBULATORY_CARE_PROVIDER_SITE_OTHER): Payer: 59 | Admitting: Behavioral Health

## 2012-03-04 DIAGNOSIS — F411 Generalized anxiety disorder: Secondary | ICD-10-CM

## 2012-03-05 ENCOUNTER — Encounter (HOSPITAL_COMMUNITY): Payer: Self-pay | Admitting: Behavioral Health
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
## 2012-03-15 ENCOUNTER — Encounter (HOSPITAL_COMMUNITY): Payer: Self-pay | Admitting: Psychiatry

## 2012-03-15 ENCOUNTER — Ambulatory Visit (INDEPENDENT_AMBULATORY_CARE_PROVIDER_SITE_OTHER): Payer: 59 | Admitting: Psychiatry

## 2012-03-15 DIAGNOSIS — F4001 Agoraphobia with panic disorder: Secondary | ICD-10-CM

## 2012-03-15 DIAGNOSIS — F329 Major depressive disorder, single episode, unspecified: Secondary | ICD-10-CM

## 2012-03-15 DIAGNOSIS — F429 Obsessive-compulsive disorder, unspecified: Secondary | ICD-10-CM

## 2012-03-15 MED ORDER — FLUOXETINE HCL 20 MG PO CAPS: ORAL_CAPSULE | ORAL | Status: DC
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
## 2012-03-16 ENCOUNTER — Encounter (HOSPITAL_COMMUNITY): Payer: Self-pay | Admitting: Psychiatry

## 2012-03-17 ENCOUNTER — Telehealth (HOSPITAL_COMMUNITY): Payer: Self-pay

## 2012-03-17 DIAGNOSIS — F329 Major depressive disorder, single episode, unspecified: Secondary | ICD-10-CM

## 2012-03-17 DIAGNOSIS — F429 Obsessive-compulsive disorder, unspecified: Secondary | ICD-10-CM

## 2012-03-17 DIAGNOSIS — F4001 Agoraphobia with panic disorder: Secondary | ICD-10-CM

## 2012-03-17 NOTE — Telephone Encounter (Signed)
Called patient informed him I have called in prescription.

## 2012-03-17 NOTE — Telephone Encounter (Signed)
Pharmacy said they did not receive medication request can please call it in

## 2012-03-18 ENCOUNTER — Ambulatory Visit (HOSPITAL_COMMUNITY): Payer: Self-pay | Admitting: Behavioral Health

## 2012-03-24 ENCOUNTER — Telehealth (HOSPITAL_COMMUNITY): Payer: Self-pay

## 2012-03-24 NOTE — Telephone Encounter (Signed)
Pt is very anxious this morning would like appt today. Explained to wife that we do not do crisis appts. She states pt has no SI/HI. Would like to speak to you

## 2012-03-24 NOTE — Telephone Encounter (Signed)
Called patient on his home and cell number, but was unable to reach patient at any listed number. I attempted to call the patient's wife and left a message regarding the same.

## 2012-03-24 NOTE — Telephone Encounter (Signed)
Called patient back on both his home and cell numbers, left a message stating I would call back again today.

## 2012-03-24 NOTE — Telephone Encounter (Signed)
Patient reports that he has increased anxiety with hyperventilation. These symptoms revolve mainly around his daughter's discharge from the hospital and continued mental health issues. He reports he is taking his medications and denies any side effects.  PLAN: Will fill FMLA papers for the patient.

## 2012-03-26 ENCOUNTER — Encounter (HOSPITAL_COMMUNITY): Payer: Self-pay | Admitting: Behavioral Health

## 2012-03-26 ENCOUNTER — Telehealth (HOSPITAL_COMMUNITY): Payer: Self-pay | Admitting: Psychiatry

## 2012-03-26 ENCOUNTER — Ambulatory Visit (INDEPENDENT_AMBULATORY_CARE_PROVIDER_SITE_OTHER): Payer: 59 | Admitting: Behavioral Health

## 2012-03-26 DIAGNOSIS — F339 Major depressive disorder, recurrent, unspecified: Secondary | ICD-10-CM

## 2012-03-26 DIAGNOSIS — F411 Generalized anxiety disorder: Secondary | ICD-10-CM

## 2012-03-26 NOTE — Telephone Encounter (Signed)
The patient reports that he has been unable to think about work. He states his daughter's mental health problems have caused the patient to have panic attacks and worsening of depression.  The patient states that he is considering taking some time off to watch over his daughter until she is able to get into [facility] or other type of long term treatment facility.  PLAN: Increase fluoxetine to 50 mg daily. Will stay at this dosage for two weeks prior to raising dosage.

## 2012-03-26 NOTE — Progress Notes (Signed)
THERAPIST PROGRESS NOTE  Session Time: 9:00  Participation Level: Active  Behavioral Response: CasualAlertDepressed/anxious  Type of Therapy: Individual Therapy  Treatment Goals addressed: Coping  Interventions: CBT  Summary: Stephen Myers is a 51 y.o. male who presents with anxiety and depression.   Suicidal/Homicidal: Nowithout intent/plan  Therapist Response: The client entered the session indicating that he is having a difficult time. He reported that his daughter had been hospitalized again after relapse on heroin. He indicated that she is also been buying some other drugs off the street as well as taking prescription medication from his wife. He reported that she he was in the hospital from Sunday to Wednesday and is now doing outpatient therapy with the anticipation of going to [facility] when a bed becomes available. The client is dealing with feeling completely out of control. He admits that he is a person who has been always able to control and fix things and he knows now but he can't. He indicates intellectually he knows that his daughter being in treatment is addressing for her and that emotionally he is having a hard time dealing with that but does occasionally see her once a week. The client is struggling with negative thinking feeling that there could be a chance that his daughter would not come through this and recover. He is concerned about her health and safety and feels that if this does not get better she could either kill herself or drug use or kill herself otherwise. We talked about re framing his thoughts and stress the positives. We also talked about letting go of things he cannot control including his daughter who has to make choices that benefit her. We also talked about the importance of increasing because this is a form of grief and dealing with the emotions as they come about. We also talked about at length the client letting go of the" manly" way of the way he is  dealt with things in the past. I encouraged him that he and his wife need to lean on each other so shoulders. He said his wife had never seen like this and was actually thankful to see him where he was unable to control his emotions and became tearful at times. He became tearful often during the session to talk about his love for his wife and daughter. His wife is also doing some health issues as her blood sugar is running very high as she is diabetic. He also indicated that he tried to work 2 days a week and had a major panic attack on Tuesday. He did speak with Dr. Gelene Mink about his medication. Also gave him a copy of relaxation CD we talked about ways that he could deal with this panic. He said that his boss is being very understanding allowed to take this week off and that he is supposed to go back to work on Tuesday. We talked about starting back to work gradually in compartmentalizing areas of his life that he'll become overwhelming. 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 We talked at length about the client dealing with his emotions as opposed to acting like he is not  supposed to feel them or suppressing them. The client did contract for safety. He indicated that after the panic attack he felt like drinking again but recognize that would not be a good solution would only be numbing the pain for short amount of time. A praised him for wife's decision and ask him to allow himself to be accountable to his wife he felt like drinking again. We talked about the client letting his daughter make better decisions because he could not control them and again allowing himself to feel as opposed to suppressing his emotion.  Plan: Return  again in 2 weeks.  Diagnosis: Axis I: 300.02/296.32    Axis II: Deferred    [name], [facility] 03/26/2012

## 2012-03-30 ENCOUNTER — Telehealth (HOSPITAL_COMMUNITY): Payer: Self-pay | Admitting: Psychiatry

## 2012-03-30 NOTE — Telephone Encounter (Signed)
Called patient back.

## 2012-04-02 ENCOUNTER — Encounter (HOSPITAL_COMMUNITY): Payer: Self-pay | Admitting: Psychiatry

## 2012-04-02 ENCOUNTER — Ambulatory Visit (INDEPENDENT_AMBULATORY_CARE_PROVIDER_SITE_OTHER): Payer: 59 | Admitting: Psychiatry

## 2012-04-02 VITALS — BP 137/82 | HR 71 | Ht 76.0 in | Wt 266.0 lb

## 2012-04-02 DIAGNOSIS — F4001 Agoraphobia with panic disorder: Secondary | ICD-10-CM

## 2012-04-02 DIAGNOSIS — F332 Major depressive disorder, recurrent severe without psychotic features: Secondary | ICD-10-CM

## 2012-04-02 DIAGNOSIS — F429 Obsessive-compulsive disorder, unspecified: Secondary | ICD-10-CM
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
## 2012-04-06 ENCOUNTER — Ambulatory Visit (INDEPENDENT_AMBULATORY_CARE_PROVIDER_SITE_OTHER): Payer: 59 | Admitting: Behavioral Health

## 2012-04-06 DIAGNOSIS — F411 Generalized anxiety disorder: Secondary | ICD-10-CM

## 2012-04-07 ENCOUNTER — Telehealth (HOSPITAL_COMMUNITY): Payer: Self-pay

## 2012-04-07 ENCOUNTER — Encounter (HOSPITAL_COMMUNITY): Payer: Self-pay | Admitting: Behavioral Health

## 2012-04-07 DIAGNOSIS — F332 Major depressive disorder, recurrent severe without psychotic features: Secondary | ICD-10-CM

## 2012-04-07 MED ORDER — FLUOXETINE HCL 20 MG PO CAPS: ORAL_CAPSULE | ORAL | Status: DC
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
## 2012-04-07 NOTE — Telephone Encounter (Signed)
Called patient. Reports that he has started taking 60 mg daily.

## 2012-04-08 NOTE — Telephone Encounter (Signed)
Called patient today. He reports that he is doing well.  He has been going out.

## 2012-04-16 ENCOUNTER — Encounter (HOSPITAL_COMMUNITY): Payer: Self-pay | Admitting: Psychiatry

## 2012-04-16 ENCOUNTER — Ambulatory Visit (INDEPENDENT_AMBULATORY_CARE_PROVIDER_SITE_OTHER): Payer: 59 | Admitting: Psychiatry

## 2012-04-16 VITALS — BP 139/88 | HR 80 | Ht 76.0 in | Wt 268.0 lb

## 2012-04-16 DIAGNOSIS — F4001 Agoraphobia with panic disorder: Secondary | ICD-10-CM

## 2012-04-16 DIAGNOSIS — F429 Obsessive-compulsive disorder, unspecified: Secondary | ICD-10-CM

## 2012-04-16 DIAGNOSIS — F332 Major depressive disorder, recurrent severe without psychotic features: Secondary | ICD-10-CM

## 2012-04-16 DIAGNOSIS — F3189 Other bipolar disorder: Secondary | ICD-10-CM
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
## 2012-04-19 ENCOUNTER — Telehealth (HOSPITAL_COMMUNITY): Payer: Self-pay

## 2012-04-19 DIAGNOSIS — F332 Major depressive disorder, recurrent severe without psychotic features: Secondary | ICD-10-CM

## 2012-04-19 DIAGNOSIS — F4001 Agoraphobia with panic disorder: Secondary | ICD-10-CM

## 2012-04-19 DIAGNOSIS — F429 Obsessive-compulsive disorder, unspecified: Secondary | ICD-10-CM

## 2012-04-20 ENCOUNTER — Encounter (HOSPITAL_COMMUNITY): Payer: Self-pay | Admitting: Behavioral Health

## 2012-04-20 ENCOUNTER — Ambulatory Visit (INDEPENDENT_AMBULATORY_CARE_PROVIDER_SITE_OTHER): Payer: 59 | Admitting: Behavioral Health

## 2012-04-20 DIAGNOSIS — F411 Generalized anxiety disorder: Secondary | ICD-10-CM

## 2012-04-20 MED ORDER — FLUOXETINE HCL 10 MG PO CAPS: 10.0000 mg | ORAL_CAPSULE | Freq: Every day | ORAL | Status: DC

## 2012-04-20 MED ORDER — FLUOXETINE HCL 20 MG PO CAPS: ORAL_CAPSULE | ORAL | Status: DC

## 2012-04-20 NOTE — Telephone Encounter (Signed)
Patient reports that 80 mg is too strong for him and prefers 70 mg dosage.  PLAN: Will send in new prescription for 30 day supply of three 20 mg capsules and one 10 mg capsule.
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
## 2012-04-21 ENCOUNTER — Ambulatory Visit (HOSPITAL_COMMUNITY): Payer: Self-pay | Admitting: Behavioral Health

## 2012-04-26 ENCOUNTER — Telehealth (HOSPITAL_COMMUNITY): Payer: Self-pay | Admitting: Psychiatry

## 2012-04-26 NOTE — Telephone Encounter (Signed)
Patient's wife called, she reports patient is having panic attacks despite being on 70 mg of fluoxetine. Called the patient, he denies SI/HI/AVH. He does endorse panic attacks.  PLAN: 1. Will see patient tomorrow.

## 2012-04-27 ENCOUNTER — Ambulatory Visit (HOSPITAL_COMMUNITY): Payer: Self-pay | Admitting: Behavioral Health

## 2012-04-27 ENCOUNTER — Ambulatory Visit (INDEPENDENT_AMBULATORY_CARE_PROVIDER_SITE_OTHER): Payer: 59 | Admitting: Psychiatry

## 2012-04-27 VITALS — BP 139/86 | HR 85 | Ht 76.0 in | Wt 260.0 lb

## 2012-04-27 DIAGNOSIS — F329 Major depressive disorder, single episode, unspecified: Secondary | ICD-10-CM

## 2012-04-27 DIAGNOSIS — F4001 Agoraphobia with panic disorder: Secondary | ICD-10-CM

## 2012-04-27 DIAGNOSIS — F332 Major depressive disorder, recurrent severe without psychotic features: Secondary | ICD-10-CM

## 2012-04-27 DIAGNOSIS — F429 Obsessive-compulsive disorder, unspecified: Secondary | ICD-10-CM

## 2012-04-27 MED ORDER — CLONAZEPAM 0.5 MG PO TABS: 0.2500 mg | ORAL_TABLET | Freq: Two times a day (BID) | ORAL | Status: DC | PRN

## 2012-04-27 MED ORDER — FLUOXETINE HCL 40 MG PO CAPS: 80.0000 mg | ORAL_CAPSULE | Freq: Every day | ORAL | Status: DC
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
## 2012-04-28 ENCOUNTER — Ambulatory Visit (INDEPENDENT_AMBULATORY_CARE_PROVIDER_SITE_OTHER): Payer: 59 | Admitting: Behavioral Health

## 2012-04-28 DIAGNOSIS — F411 Generalized anxiety disorder: Secondary | ICD-10-CM

## 2012-04-29 ENCOUNTER — Encounter (HOSPITAL_COMMUNITY): Payer: Self-pay | Admitting: Behavioral Health

## 2012-04-29 NOTE — Progress Notes (Signed)
   THERAPIST PROGRESS NOTE  Session Time: 2:00  Participation Level: Active  Behavioral Response: CasualAlertAnxious  Type of Therapy: Individual Therapy  Treatment Goals addressed: Coping  Interventions: CBT  Summary: Oaklan Persons is a 51 y.o. male who presents with anxiety.   Suicidal/Homicidal: Nowithout intent/plan  Therapist Response: The client entered the session indicating that he continues to struggle with anxiety. Throughout the session he presented with difficulty in being able to formulate thoughts in getting words out in the length of time that he normally would. He was still very clear thinking and articulate but slow in presentation. He indicated that he recognizes that anxiety is causing. He does have some hesitation with taking medication same medication he was taking originally does not seem to benefit him. He did express some anxiety with taking Klonopin. He did take 1 last night saying that he felt it helped him sleep longer. He woke up at 4 AM and began to have racing thoughts about the job situation. He was thankful for the rest that he got until that point. He indicated he was fearful of taking the Klonopin fair would make him drowsy. I suggested that he take it during the day when he was at home knowing that he was not going anywhere. He did say that his place of employment is working with him to extended time out that he needs and he found that he can take up to 25 weeks of short-term disability if he needs to do that. He also indicates that he was more or less offered a job in [employer] office which should be a substantial increase in pay. He said that the" old self" could do the job but he is concerned that he and his current state of anxiety he could not perform the job like he thinks he should. 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 He indicated that right now he feels he can no longer do the job he is doing because he can't deal with the social anxiety and confrontations that he needs to have sometimes on his job. We spent significant time processing the client's fears and anxieties as well as reviewing coping strategies. The client did indicate that he is made a statement he recognizes when he is having irrational thoughts so talked about the next that is to attempt to begin to replace those irrational thoughts. We used as an example a job opportunity has. He indicated that we spoke up this morning and started thinking about it started with the fact that he didn't think he deserved it and spiraled downward from there. We talked about how he can use cognitive behavioral therapy to begin to change those irrational thoughts. I will see the client again in one week.  Plan: Return again in 1 weeks.  Diagnosis: Axis I: 300.02    Axis II: Deferred    [name], [facility] 04/29/2012

## 2012-04-30 ENCOUNTER — Telehealth (HOSPITAL_COMMUNITY): Payer: Self-pay

## 2012-04-30 ENCOUNTER — Ambulatory Visit (HOSPITAL_COMMUNITY): Payer: Self-pay | Admitting: Psychiatry

## 2012-04-30 LAB — DRUG SCREEN, URINE
Benzodiazepines.: NEGATIVE
Methadone: NEGATIVE
Phencyclidine (PCP): NEGATIVE
Propoxyphene: NEGATIVE

## 2012-04-30 NOTE — Telephone Encounter (Signed)
Called the patient's wife to let her know the patient's paperwork has been faxed in .

## 2012-05-05 ENCOUNTER — Encounter (HOSPITAL_COMMUNITY): Payer: Self-pay | Admitting: Behavioral Health

## 2012-05-05 ENCOUNTER — Ambulatory Visit (INDEPENDENT_AMBULATORY_CARE_PROVIDER_SITE_OTHER): Payer: 59 | Admitting: Behavioral Health

## 2012-05-05 DIAGNOSIS — F411 Generalized anxiety disorder: Secondary | ICD-10-CM

## 2012-05-05 DIAGNOSIS — F331 Major depressive disorder, recurrent, moderate: Secondary | ICD-10-CM

## 2012-05-05 NOTE — Progress Notes (Signed)
   THERAPIST PROGRESS NOTE  Session Time: 1:00  Participation Level: Active  Behavioral Response: CasualAlertAnxious/depressed  Type of Therapy: Individual Therapy  Treatment Goals addressed: Coping  Interventions: CBT  Summary: Stephen Myers is a 51 y.o. male who presents with adhd.   Suicidal/Homicidal: Nowithout intent/plan  Therapist Response: The client started the session by saying that he feels better. He indicated that he is still struggling getting his thoughts out verbally but he can tell that he is thinking much more clearly and is more optimistic. He did express some frustration in knowing how long his short-term disability will be. He indicated that his insurance company told him into July 15 but that he has an appointment with Dr. Demetrius Charity. on July 16. I told him that he needed to call Dr. Demetrius Charity. and ask how long he had recommended that he be out. He indicated that he started to let that felt spiral out of control but didn't indicated that he recognizes what he could and could not control about that situation. The client indicated that he apologize to his wife for her having to take care of him so much and her response was that she enjoyed it. He indicated that she told him for years she tried to be strong and take care and control everything and that she like the fact that she was able to control some things and to take care of him.Marland Kitchen He indicated that brought toy to his heart. He also indicated that his daughter is starting to feel much better and is starting to pick on him which makes him feel better about the direction she is going and he is going. He indicated that he recognizes that he is tired his well-being into his daughters and correctly parallels his recovery process with his daughters. He also indicated that his son came to see him and although the client is emotionally now concerned the son it did not freaked him out. He indicated that his son jokingly told him Rainman and the  client indicated that he found significant amount of humor and that. The client indicated that he realized yesterday that he has begun to turn the corner. He reported that in his mind he had The Seroquel that was prescribed by a former Dr. as a" backup plan" indicates she didn't want to commit suicide. He indicated that he did not have a specific plan in place but help him to those pills. He indicated that he flushed them down the toilet yesterday and saw that as a milestone,. I praised client for that step that he took for throwing way the medication. He had joked with me saying he would never shoot himself because he was too pretty. I laughed with him. He did contract for safety saying he is not suicidal now and feels like he turned a corner by flushing the Seroquel. The client did present with a much brighter affect today and even though he cannot no significant difference I noticed the fact that his words are much clearer and there is less of a delay between thought and speech with the client.  I will see decline again in 2 weeks is them on vacation next week. I reminded the client to call Dr. Demetrius Charity. to see how long he recommended that he be out of work.      Plan: Return again in 2 weeks.  Diagnosis: Axis I: 300.02/296.32    Axis II: Deferred    Charisa Twitty M, LPC 05/05/2012

## 2012-05-08 ENCOUNTER — Emergency Department
Admission: EM | Admit: 2012-05-08 | Discharge: 2012-05-08 | Disposition: A | Discharge status: Home / Self Care | Payer: 59 | Source: Home / Self Care | Attending: Family Medicine | Admitting: Family Medicine

## 2012-05-08 DIAGNOSIS — L255 Unspecified contact dermatitis due to plants, except food: Secondary | ICD-10-CM

## 2012-05-08 MED ORDER — TRIAMCINOLONE ACETONIDE 40 MG/ML IJ SUSP
40.0000 mg | Freq: Once | INTRAMUSCULAR | Status: AC
  Administered 2012-05-08: 40 mg via INTRAMUSCULAR

## 2012-05-08 NOTE — ED Notes (Signed)
Stephen Myers complains of rash on his legs, arms and chest this morning. He worked out in his yard yesterday and he is allergic to poison ivy and poison oak.

## 2012-05-08 NOTE — ED Provider Notes (Signed)
History     CSN: [id number]  Arrival date & time 05/08/12  1129   First MD Initiated Contact with Patient 05/08/12 1159      Chief Complaint  Patient presents with  . Rash    x this am      HPI Comments: The patient worked in his yard yesterday, contacting poison ivy, and today has developed pruritic rash on his chest and extremities.  He feels well otherwise.  No fevers, chills, and sweats.  Patient is a 51 y.o. male presenting with Poison Ivy. The history is provided by the patient.  Poison Ivy The current episode started 3 to 5 hours ago. The problem occurs constantly. The problem has been gradually worsening. Associated symptoms comments: none. Nothing aggravates the symptoms. Nothing relieves the symptoms. He has tried nothing for the symptoms.    Past Medical History  Diagnosis Date  . Irritability and anger     symptoms at home and work  . Anxiety   . Depression     Past Surgical History  Procedure Date  . Appendectomy   . Tonsillectomy     Family History  Problem Relation Age of Onset  . Depression Mother   . Cancer Mother     breast  . Bipolar disorder Father   . Stroke Father   . Depression Brother   . Depression Maternal Grandmother   . Bipolar disorder Daughter     History  Substance Use Topics  . Smoking status: Former Smoker -- 0.0 packs/day for 15 years    Types: Cigarettes    Quit date: 11/03/2000  . Smokeless tobacco: Never Used  . Alcohol Use: 1.0 - 1.5 oz/week    2-3 drink(s) per week     Sneaks scotch (2-3 mixed) per Dr. Ferol Luz assessment 08/27/2011      Review of Systems  All other systems reviewed and are negative.    Allergies  Review of patient's allergies indicates no known allergies.  Home Medications   Current Outpatient Rx  Name Route Sig Dispense Refill  . CLONAZEPAM 0.5 MG PO TABS Oral Take 0.5 tablets (0.25 mg total) by mouth 2 (two) times daily as needed for anxiety. 60 tablet 0  . FLUOXETINE HCL 40 MG PO CAPS  Oral Take 2 capsules (80 mg total) by mouth daily. 60 capsule 1  . NAPROXEN 500 MG PO TABS Oral Take 1 tablet (500 mg total) by mouth 2 (two) times daily as needed. 30 tablet 0    BP 120/78  Pulse 82  Temp 97.4 F (36.3 C) (Oral)  Resp 18  Ht 6\' 4"  (1.93 m)  Wt 259 lb (117.482 kg)  BMI 31.53 kg/m2  SpO2 96%  Physical Exam  Nursing note and vitals reviewed. Constitutional: He is oriented to person, place, and time. He appears well-developed and well-nourished. No distress.  HENT:  Head: Normocephalic.  Mouth/Throat: Oropharynx is clear and moist.  Eyes: Conjunctivae and EOM are normal. Pupils are equal, round, and reactive to light.  Cardiovascular: Normal heart sounds.   Pulmonary/Chest: Breath sounds normal.  Musculoskeletal: He exhibits no edema.  Lymphadenopathy:    He has no cervical adenopathy.  Neurological: He is alert and oriented to person, place, and time.  Skin: Skin is warm and dry. Rash noted. Rash is maculopapular and vesicular. There is erythema.          There are multiple erythematous vesicular and maculopapular erythematous lesions scattered over trunk and arms.  No weeping.  ED Course  Procedures none       1. Contact dermatitis due to plant       MDM   Kenalog 40mg  IM Recommend Benadryl 50mg  at bedtime for rash and itching. Followup with Family Doctor if not improved in one week.         Lattie Haw, MD 05/13/12 [phone]

## 2012-05-13 ENCOUNTER — Ambulatory Visit (INDEPENDENT_AMBULATORY_CARE_PROVIDER_SITE_OTHER): Payer: 59 | Admitting: Psychiatry

## 2012-05-13 ENCOUNTER — Encounter (HOSPITAL_COMMUNITY): Payer: Self-pay | Admitting: Psychiatry

## 2012-05-13 ENCOUNTER — Telehealth: Payer: Self-pay

## 2012-05-13 VITALS — BP 158/97 | HR 85 | Ht 76.0 in | Wt 260.0 lb

## 2012-05-13 DIAGNOSIS — F332 Major depressive disorder, recurrent severe without psychotic features: Secondary | ICD-10-CM

## 2012-05-13 DIAGNOSIS — F429 Obsessive-compulsive disorder, unspecified: Secondary | ICD-10-CM

## 2012-05-13 DIAGNOSIS — F329 Major depressive disorder, single episode, unspecified: Secondary | ICD-10-CM

## 2012-05-13 DIAGNOSIS — F4001 Agoraphobia with panic disorder: Secondary | ICD-10-CM
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
## 2012-05-18 ENCOUNTER — Ambulatory Visit (HOSPITAL_COMMUNITY): Payer: Self-pay | Admitting: Psychiatry

## 2012-05-18 ENCOUNTER — Telehealth (HOSPITAL_COMMUNITY): Payer: Self-pay | Admitting: Psychiatry

## 2012-05-18 NOTE — Telephone Encounter (Signed)
Called patient. Will complete disability forms this week.

## 2012-05-19 ENCOUNTER — Encounter (HOSPITAL_COMMUNITY): Payer: Self-pay | Admitting: Behavioral Health

## 2012-05-20 ENCOUNTER — Ambulatory Visit (INDEPENDENT_AMBULATORY_CARE_PROVIDER_SITE_OTHER): Payer: 59 | Admitting: Behavioral Health

## 2012-05-20 ENCOUNTER — Telehealth (HOSPITAL_COMMUNITY): Payer: Self-pay | Admitting: Behavioral Health

## 2012-05-20 ENCOUNTER — Encounter (HOSPITAL_COMMUNITY): Payer: Self-pay | Admitting: Behavioral Health

## 2012-05-20 DIAGNOSIS — F411 Generalized anxiety disorder: Secondary | ICD-10-CM

## 2012-05-20 NOTE — Progress Notes (Signed)
   THERAPIST PROGRESS NOTE  Session Time: 11:00  Participation Level: Active  Behavioral Response: CasualAlertAnxious  Type of Therapy: Individual Therapy  Treatment Goals addressed: Coping  Interventions: CBT  Summary: Stephen Myers is a 51 y.o. male who presents with anxiety.   Suicidal/Homicidal: Nowithout intent/plan  Therapist Response: The client indicated that he did feel somewhat better. He was clear that he is speaking more clearly and there is less difficulty with transferring thoughts into words. There still continues to be some hesitation and delays in some speech he looked moderately more relaxed. He indicated that a meeting with Dr. Demetrius Charity. last week he realized that he had been putting everything on a time line in terms of his recovery. He said he has begun to realize that he can't put his hearing on a time line but still needs to find a way to set goals for himself. We talked about setting small goals in finding some comfort no such as his speech improving, such as we talked about in the last session him going to the areas that he used to work even though being nervous not being overwhelmed. We talked about him being able to let go and let his daughter take care of some of her own. The client struggles with the word surrender because to him indicates weakness that we talked about how it's okay to be dependent on others for short amount of time in terms of his healing. He indicated that the words dependable and reliable always been used in association with him and he feels at times that he is letting people down especially his coworkers. I assured him that Dr. Demetrius Charity. give him time to heal and that his coworkers would think no less of him for missing work not dealing with certain situations in a way that he thinks he should. The client has become much more introspective and reflective realizing that there are multiple things over the course of his life that have created animated to his  anxiety. He listed does talked about his thought process was those. He reported that in the past he is unable to write down his feelings he is gotten away from that so I challenged him to begin to do that again in a small way. She laughed I told him that he did not have to do it on a set schedule or any number of times per day but to do as thoughts and feelings pop into his head or heart. He laughed saying he was sitting or think about how many times a day he needed to write that down. The client did contract for safety. He indicated that he is leaving Saturday to go to the beach for week and has mixed feelings about that but for the most part is looking forward to getting away from here and relaxing some.  Plan: Return again in 2 weeks.  Diagnosis: Axis I: 300.02    Axis II: Deferred    Draken Farrior M, [facility] 05/20/2012

## 2012-05-20 NOTE — Telephone Encounter (Signed)
I left the client a voicemail that I had faxed requested documents to his insurance company and had spoken to Dr. Demetrius Charity. and Dr. Demetrius Charity. indicated that he would fax all the information necessary to the insurance company by the end of this week.Marland Kitchen

## 2012-05-28 ENCOUNTER — Ambulatory Visit (HOSPITAL_COMMUNITY): Payer: Self-pay | Admitting: Behavioral Health

## 2012-05-31 ENCOUNTER — Encounter (HOSPITAL_COMMUNITY): Payer: Self-pay | Admitting: Psychiatry

## 2012-05-31 ENCOUNTER — Ambulatory Visit (INDEPENDENT_AMBULATORY_CARE_PROVIDER_SITE_OTHER): Payer: 59 | Admitting: Psychiatry

## 2012-05-31 VITALS — BP 152/83 | HR 82 | Ht 76.0 in | Wt 260.0 lb

## 2012-05-31 DIAGNOSIS — F4001 Agoraphobia with panic disorder: Secondary | ICD-10-CM

## 2012-05-31 DIAGNOSIS — F429 Obsessive-compulsive disorder, unspecified: Secondary | ICD-10-CM

## 2012-05-31 DIAGNOSIS — F329 Major depressive disorder, single episode, unspecified: Secondary | ICD-10-CM

## 2012-05-31 MED ORDER — FLUOXETINE HCL 20 MG PO CAPS: ORAL_CAPSULE | ORAL | Status: DC

## 2012-05-31 MED ORDER — CLONAZEPAM 1 MG PO TABS: 1.0000 mg | ORAL_TABLET | Freq: Every evening | ORAL | Status: DC | PRN

## 2012-05-31 MED ORDER — CLONAZEPAM 0.5 MG PO TABS: 0.2500 mg | ORAL_TABLET | Freq: Two times a day (BID) | ORAL | Status: DC | PRN

## 2012-05-31 MED ORDER — VENLAFAXINE HCL ER 37.5 MG PO CP24: ORAL_CAPSULE | ORAL | Status: DC
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
## 2012-06-01 ENCOUNTER — Ambulatory Visit (HOSPITAL_COMMUNITY): Payer: Self-pay | Admitting: Behavioral Health

## 2012-06-07 ENCOUNTER — Encounter (HOSPITAL_COMMUNITY): Payer: Self-pay | Admitting: Behavioral Health

## 2012-06-07 ENCOUNTER — Ambulatory Visit (INDEPENDENT_AMBULATORY_CARE_PROVIDER_SITE_OTHER): Payer: 59 | Admitting: Behavioral Health

## 2012-06-07 DIAGNOSIS — F411 Generalized anxiety disorder: Secondary | ICD-10-CM
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
## 2012-06-17 ENCOUNTER — Telehealth (HOSPITAL_COMMUNITY): Payer: Self-pay | Admitting: Psychiatry

## 2012-06-17 NOTE — Telephone Encounter (Signed)
Called patient

## 2012-06-18 ENCOUNTER — Encounter (HOSPITAL_COMMUNITY): Payer: Self-pay | Admitting: Psychiatry

## 2012-06-18 ENCOUNTER — Ambulatory Visit (INDEPENDENT_AMBULATORY_CARE_PROVIDER_SITE_OTHER): Payer: 59 | Admitting: Psychiatry

## 2012-06-18 VITALS — BP 122/88 | HR 71 | Ht 76.0 in | Wt 254.0 lb

## 2012-06-18 DIAGNOSIS — F329 Major depressive disorder, single episode, unspecified: Secondary | ICD-10-CM

## 2012-06-18 DIAGNOSIS — F429 Obsessive-compulsive disorder, unspecified: Secondary | ICD-10-CM

## 2012-06-18 DIAGNOSIS — F4001 Agoraphobia with panic disorder: Secondary | ICD-10-CM

## 2012-06-18 MED ORDER — VENLAFAXINE HCL ER 150 MG PO CP24: ORAL_CAPSULE | ORAL | Status: DC
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
## 2012-06-22 ENCOUNTER — Encounter (HOSPITAL_COMMUNITY): Payer: Self-pay | Admitting: Behavioral Health

## 2012-06-22 ENCOUNTER — Ambulatory Visit (INDEPENDENT_AMBULATORY_CARE_PROVIDER_SITE_OTHER): Payer: 59 | Admitting: Behavioral Health

## 2012-06-22 DIAGNOSIS — F411 Generalized anxiety disorder: Secondary | ICD-10-CM

## 2012-06-22 NOTE — Progress Notes (Signed)
   THERAPIST PROGRESS NOTE  Session Time: 8:00  Participation Level: Active  Behavioral Response: CasualAlertAnxious/depressed  Type of Therapy: Individual Therapy  Treatment Goals addressed: Coping  Interventions: CBT  Summary: Stephen Myers is a 51 y.o. male who presents with anxiety.   Suicidal/Homicidal: Nowithout intent/plan  Therapist Response: The client indicated that the last few days especially have been difficult. He indicated that he thought that his daughter looked glazed over a few days ago and then found a tooth bottle under the passenger seat of his daughter's car was still some kind of alcohol. He indicated later that he found a text on her phone where she had been asking for asked to see Xanax and one other medication. She initially denied but later told her mother that she had only smoked marijuana. The client indicated the [place] himself because he did not initially noticed that she may have relapsed and is questioning thinks that he has said or done. He reported that he is at his" wits end" because he feels he has done or said everything to his daughter that he did say but that he can't make her stop using. He indicates that he has done research on line he understands that people who have become addicted to heroin don't normally do extremely well and attempts at recovery and is concerned that his daughter is to that point. We use cognitive behavioral therapy to attempt to help the client reframe his thoughts toward his daughter. He indicates that he has had multiple thoughts in which she was dead and at times reports that he has felt about not living if his daughter does not live. He denies any suicidality suicidal ideation is point and does contract for safety this is a thoughts of living without her negative thoughts for him. He reports that he felt he was beginning to do better when she was doing better the last 2 days feels like he has relapsed that she has. It is  clear that he feels his recovery strongly attached to his daughters. He attempted to separate the 2. We did talk about what he did say to his daughter in terms of how he and the clients wife want to be supportive with her daughter in what they can and that they love her but they don't know when asked to do and that she needs to take some responsibility in her recovery. He indicated that she said her anxiety returned when she started back to school yesterday the client feels that she started using prior to that. The client indicated that he did attempt some gradual exposure therapy saying that he wanted to another care toward others and his but even that became somewhat overwhelming. He indicates that he has somewhat become obsessed with thinking about where he will go back to work. Attempted to refocus on the fact that he will go back to work not a timeframe on that the client is having difficulty with that concept. We did not process any thing related to his father because he was focused on his daughters relapse and how that has affected her and how it has in turn effected him.  Plan: Return again in 1 weeks.  Diagnosis: Axis I: 300.02    Axis : Deferred    [name], [facility] 06/22/2012

## 2012-06-29 ENCOUNTER — Ambulatory Visit (INDEPENDENT_AMBULATORY_CARE_PROVIDER_SITE_OTHER): Payer: 59 | Admitting: Behavioral Health

## 2012-06-29 ENCOUNTER — Encounter (HOSPITAL_COMMUNITY): Payer: Self-pay | Admitting: Behavioral Health

## 2012-06-29 DIAGNOSIS — F411 Generalized anxiety disorder: Secondary | ICD-10-CM
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
## 2012-07-08 ENCOUNTER — Ambulatory Visit (INDEPENDENT_AMBULATORY_CARE_PROVIDER_SITE_OTHER): Payer: 59 | Admitting: Behavioral Health

## 2012-07-08 DIAGNOSIS — F411 Generalized anxiety disorder: Secondary | ICD-10-CM

## 2012-07-09 ENCOUNTER — Encounter (HOSPITAL_COMMUNITY): Payer: Self-pay | Admitting: Behavioral Health

## 2012-07-09 ENCOUNTER — Encounter (HOSPITAL_COMMUNITY): Payer: Self-pay | Admitting: Psychiatry

## 2012-07-09 NOTE — Progress Notes (Signed)
THERAPIST PROGRESS NOTE  Session Time: 11:00  Participation Level: Active  Behavioral Response: CasualAlertAnxious/depressed  Type of Therapy: Individual Therapy  Treatment Goals addressed: Coping  Interventions: CBT  Summary: Stephen Myers is a 50 y.o. male who presents with anxiety   Suicidal/Homicidal: Nowithout intent/plan  Therapist Response: The client presented as a with moderate to severe anxiety. He still struggles with difficulty in speech often presenting as if he is stuttering and stammering. He does indicate that his thoughts are clear but his anxiety is in part being manifested through difficulty with speech. He also presents with sudden, rapid shakes normally of his upper body. He presented with those shakes on at least 2 occasions during this session. I had witnessed him in past sessions with these physical manifestations of his anxiety. He did report one positive saying that he was able to drive down the [address] which went through his work. Poorly. He indicated that he did feel some anxiety but was not overwhelming as it has been in the past. He did say that was an exception to the rules this week and that earlier in the week he was too anxious to leave the home for 2 or 3 days. He indicated that he recognizes a significant part of his anxiety is connected to how well his daughter is doing. He indicated that he is become anxious when he is around her even if he feels that she is having a good day. He indicates that he has become somewhat fearful that she could overdose much as her boyfriend did 2 weeks ago. We also spent some time exploring his history with his father and how that contributes to his depression and anxiety. He indicates that he has recognized that much of what he does is driven by the fact that he is always wanted to prove that he was better any environment that he grew up in. He indicated that he grew up extremely poor but was popular enough that he  could typically high that from other people who have more money or who were more popular. He indicated that he has begun to realize that his father must have some sort of paranoia because he would leave the house for months at a time because he felt someone was after him. He indicated that it was typically doesn't feel had not been paid so father would leave the home for a few months because the a bill had not been paid and he thought someone was coming to get him. The client did indicate that he is starting a new medication but it was too early to see how effective it had been. He does have confidence in Dr. Hilton Cork and does believe that the combination of medication and therapy will help. He does recognize that he has made some progress but feels at times he is taking a few steps backwards especially when his daughter is not doing well. We spent significant time reviewing different coping skills for dealing with his anxiety and short-term. We also use cognitive behavioral therapy to talk about changing negative thoughts. The client is making an effort to practice this and has had some success with it. It is my opinion that the client is not yet ready to return to work as his anxiety is too much for him to be able to function effectively in a work environment.  Plan: Return again in 2 weeks.  Diagnosis: Axis I: 300.02    Axis II: Deferred    Stephen Myers  Judie Petit, [facility] 07/09/2012

## 2012-07-16 ENCOUNTER — Telehealth (HOSPITAL_COMMUNITY): Payer: Self-pay | Admitting: Psychiatry

## 2012-07-16 DIAGNOSIS — F4001 Agoraphobia with panic disorder: Secondary | ICD-10-CM

## 2012-07-16 MED ORDER — CLONAZEPAM 1 MG PO TABS: 1.0000 mg | ORAL_TABLET | Freq: Every evening | ORAL | Status: DC | PRN

## 2012-07-16 NOTE — Telephone Encounter (Signed)
Called in refill for Clonazepam. As patient has not refilled medications since 05/31/2012. Will decrease dosage to 1 mg daily.

## 2012-07-19 ENCOUNTER — Encounter (HOSPITAL_COMMUNITY): Payer: Self-pay | Admitting: Behavioral Health

## 2012-07-19 ENCOUNTER — Ambulatory Visit (INDEPENDENT_AMBULATORY_CARE_PROVIDER_SITE_OTHER): Payer: 59 | Admitting: Behavioral Health

## 2012-07-19 ENCOUNTER — Ambulatory Visit (HOSPITAL_COMMUNITY): Payer: Self-pay | Admitting: Behavioral Health

## 2012-07-19 DIAGNOSIS — F411 Generalized anxiety disorder: Secondary | ICD-10-CM

## 2012-07-19 NOTE — Progress Notes (Signed)
THERAPIST PROGRESS NOTE  Session Time: 1:00  Participation Level: Active  Behavioral Response: CasualAlertAnxious  Type of Therapy: Individual Therapy  Treatment Goals addressed: Coping  Interventions: CBT  Summary: Stephen Myers is a 51 y.o. male who presents with anxiety.   Suicidal/Homicidal: Nowithout intent/plan  Therapist Response: The client indicated that it has been a rough weekend. He indicated that he attempted to drive and [address] he did make an appointment began to have shaking and begin to be overwhelmed with anxiety. He indicated that snowball effect and that he went home and started think that he was a failure and said that he drank approximately one 6 pack a day on Friday Saturday and Sunday. He indicated that in part to his wife and daughter being gone for the weekend and him being at home along. He indicated that he knows he would not have drank beer if they have been home. He indicates that scares him because that's the way he used to deal with anxiety and he knows that there is a possibility if he drinks beer it can lead to drinking liquor. We did focus on the positives in the fact that he recognizes when he needed to stop drinking and that he did choose not to drink liquor. Also encouraged him not to keep anything in a house with he would not be tempted. We also talked about coping skills to deal with his severe anxiety. He indicates his wife and cursing to see his medical Dr. to see if there is a connection between the stammering speech and a medical issue but he is hesitant to do that. We talked about that in previous sessions I encouraged him to do that just roll anything out. He talked about journal ing in which he wrote about the first time he reported having a physical manifestation of the anxiety. He was at a clients office and felt like he was having physical chest pain. He had it checked out in the was no medical issues are realized then that part of what the  anxiety was he could not make that store visit fit into a" neat little package." He said he knows that is not all the issue but it was regulation him to recognize when he felt like anxiety it got to the point where it had become more physical than just some difficulty breathing every 6 months or so. He said that from that point forward his when he started to see more physical manifestations of his anxiety. He continues to focus on the fact that he sees his biggest fear is going upstairs is homicide his daughter dad. He did say she is doing better in terms of he can tell she is not using much in terms of substances now but that she is grieving the loss of her boyfriend. He started to question whether he could have done more in talking to his daughter's boyfriend thinking that maybe he could stop him from overdosing falling and hitting his head which led to his death. I pointed out that the boyfriend had to make a choice to stop himself and that even if he had been in the room and the boyfriend needless and he may just only delayed because the boyfriend had made the choice himself. The client is looking at the time that  his short-term when his disability runs out of November. He continues to set datesfor when he feels he should be better. He does have a substantial insight and recognizing  there's not of that date in That to the fact that he is always done things on a schedule and been able to type things up in that knee little package that he talks about. We talked about breaking down those thought patterns and replacing them with more realistic goals using cognitive behavioral therapy to do that. He did indicate that his medication at least per his thought pattern appears to be creating some physical shivers for him. He is seeing Dr. Demetrius Charity. tomorrow September 17 and we'll address that with him then. He continues to present with moderate to severe anxiety as manifested in the office through stammering speech and per his  reports manifested physically through shakes and tremors. He does indicate that it's better home but he still has some anxiety around his daughter which I described as anticipatory anxiety thinking that she is going to make a mistake th att will cost her life. We talked about again changing his thought pattern away from that catastrophizing.  Plan: Return again in 1 weeks.  Diagnosis: Axis I: 300.02    Axis II: Deferred    Stephen Myers M, [facility] 07/19/2012

## 2012-07-20 ENCOUNTER — Ambulatory Visit (INDEPENDENT_AMBULATORY_CARE_PROVIDER_SITE_OTHER): Payer: 59 | Admitting: Psychiatry

## 2012-07-20 ENCOUNTER — Encounter (HOSPITAL_COMMUNITY): Payer: Self-pay | Admitting: Psychiatry

## 2012-07-20 VITALS — BP 155/94 | HR 64 | Ht 76.0 in | Wt 263.0 lb

## 2012-07-20 DIAGNOSIS — F913 Oppositional defiant disorder: Secondary | ICD-10-CM

## 2012-07-20 DIAGNOSIS — F329 Major depressive disorder, single episode, unspecified: Secondary | ICD-10-CM

## 2012-07-20 DIAGNOSIS — F4001 Agoraphobia with panic disorder: Secondary | ICD-10-CM

## 2012-07-20 MED ORDER — ARIPIPRAZOLE 5 MG PO TABS: ORAL_TABLET | ORAL | Status: DC

## 2012-07-20 MED ORDER — VENLAFAXINE HCL ER 37.5 MG PO CP24: ORAL_CAPSULE | ORAL | Status: DC

## 2012-07-20 MED ORDER — CLONAZEPAM 1 MG PO TABS: 1.0000 mg | ORAL_TABLET | Freq: Every evening | ORAL | Status: DC | PRN
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
## 2012-07-27 ENCOUNTER — Encounter (HOSPITAL_COMMUNITY): Payer: Self-pay | Admitting: Behavioral Health

## 2012-07-27 ENCOUNTER — Telehealth (HOSPITAL_COMMUNITY): Payer: Self-pay

## 2012-07-27 ENCOUNTER — Ambulatory Visit (INDEPENDENT_AMBULATORY_CARE_PROVIDER_SITE_OTHER): Payer: 59 | Admitting: Behavioral Health

## 2012-07-27 DIAGNOSIS — F331 Major depressive disorder, recurrent, moderate: Secondary | ICD-10-CM

## 2012-07-27 DIAGNOSIS — F411 Generalized anxiety disorder: Secondary | ICD-10-CM
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
## 2012-07-27 NOTE — Telephone Encounter (Signed)
Called patient. Patient reports that between 2-5 PM he gets gets jittery. He did better on 1.5 mg of clonazepam but was very drowsy.  PLAN: 1. Will increase clonazepam 1.25 mg QHS.

## 2012-07-27 NOTE — Telephone Encounter (Signed)
NEEDS TO SPEAK WITH YOU ABOUT MEDICATION

## 2012-08-04 ENCOUNTER — Telehealth (HOSPITAL_COMMUNITY): Payer: Self-pay

## 2012-08-04 DIAGNOSIS — F4001 Agoraphobia with panic disorder: Secondary | ICD-10-CM

## 2012-08-04 NOTE — Telephone Encounter (Signed)
Called patient. Unable to reach him. Called his wife she was on her way home. Asked her to have patient call provider in the morning.

## 2012-08-05 MED ORDER — CLONAZEPAM 0.5 MG PO TABS: ORAL_TABLET | ORAL | Status: DC

## 2012-08-05 NOTE — Telephone Encounter (Signed)
Called patient.  The patient reports that he has difficulty cutting tablets.  PLAN: 1. Will call in Clonazepam 0.5 mg take one tablet in the morning and two tablets at bedtime.

## 2012-08-05 NOTE — Addendum Note (Signed)
Addended by: Larena Sox on: 08/05/2012 12:51 PM   Modules accepted: Orders

## 2012-08-06 ENCOUNTER — Telehealth (HOSPITAL_COMMUNITY): Payer: Self-pay | Admitting: Psychiatry

## 2012-08-06 ENCOUNTER — Ambulatory Visit (INDEPENDENT_AMBULATORY_CARE_PROVIDER_SITE_OTHER): Payer: 59 | Admitting: Behavioral Health

## 2012-08-06 DIAGNOSIS — F4001 Agoraphobia with panic disorder: Secondary | ICD-10-CM

## 2012-08-06 MED ORDER — CLONAZEPAM 0.5 MG PO TABS: ORAL_TABLET | ORAL | Status: DC

## 2012-08-06 NOTE — Telephone Encounter (Signed)
Called patient. Informed him that his prescription was available at the pharmacy.

## 2012-08-12 ENCOUNTER — Ambulatory Visit (INDEPENDENT_AMBULATORY_CARE_PROVIDER_SITE_OTHER): Payer: 59 | Admitting: Psychiatry

## 2012-08-12 ENCOUNTER — Encounter (HOSPITAL_COMMUNITY): Payer: Self-pay | Admitting: Psychiatry

## 2012-08-12 ENCOUNTER — Ambulatory Visit (HOSPITAL_COMMUNITY): Payer: Self-pay | Admitting: Psychiatry

## 2012-08-12 VITALS — BP 135/85 | HR 98 | Ht 76.0 in | Wt 268.0 lb

## 2012-08-12 DIAGNOSIS — F329 Major depressive disorder, single episode, unspecified: Secondary | ICD-10-CM

## 2012-08-12 DIAGNOSIS — F4001 Agoraphobia with panic disorder: Secondary | ICD-10-CM

## 2012-08-12 DIAGNOSIS — F429 Obsessive-compulsive disorder, unspecified: Secondary | ICD-10-CM

## 2012-08-12 MED ORDER — ARIPIPRAZOLE 5 MG PO TABS: ORAL_TABLET | ORAL | Status: DC
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
## 2012-08-13 ENCOUNTER — Ambulatory Visit (HOSPITAL_COMMUNITY): Payer: Self-pay | Admitting: Behavioral Health

## 2012-08-16 ENCOUNTER — Ambulatory Visit (INDEPENDENT_AMBULATORY_CARE_PROVIDER_SITE_OTHER): Payer: 59 | Admitting: Behavioral Health

## 2012-08-16 ENCOUNTER — Encounter (HOSPITAL_COMMUNITY): Payer: Self-pay | Admitting: Behavioral Health

## 2012-08-16 DIAGNOSIS — F411 Generalized anxiety disorder: Secondary | ICD-10-CM

## 2012-08-16 NOTE — Progress Notes (Signed)
THERAPIST PROGRESS NOTE  Session Time: 10:00  Participation Level: Active  Behavioral Response: CasualAlertAnxious  Type of Therapy: Individual Therapy  Treatment Goals addressed: Coping  Interventions: CBT  Summary: Stephen Myers is a 51 y.o. male who presents with anxiety.   Suicidal/Homicidal: Nowithout intent/plan  Therapist Response: The client indicated that there have been some positive sons over the past week. He indicated that he attempted to run through part of his territory initially could not do it and turned away. He did say that his second attempt to go to certain spots and his territory were successful and although they were anxiety producing he managed to get out of the car and when. He also indicated that he went to a funeral of a friend was able to stay in the funeral and focus on the service. He indicated that he is being weaned off of her medication and that has led to him being irritable. He also talked about his drinking patterns. He indicates that he will go 3 or 4 days without drinking beer per day and drinks 3 or 4 and then feels guilty about it. He says he wakes up daily saying I am not going to drink today and therefore experiences guild on the days when she gets him. I suggested that maybe he was creating unrealistic expectations for himself and asked that he think in terms of only drinking one beer 3 days a week as opposed to I'm not going to drink today. He agreed to try that. He indicates that he did go to his medical Dr. was frustrated by the visit. He said that everything he described as a symptom to his medical Dr. was labeled as a side effect. He did say that the doctor started him on some blood pressure medications and his blood pressure had been high the last couple of months. He also said that he has been extremely irritable with both his wife and daughter and feels badly about that. He indicates that he is status low several times that he had a blowup  with his daughter one day last week. He reports that he called one of his daughters friends a" pill head" cannot put he and his daughter in a difficult place. He said he later apologized for using the terminology. We use opportunity to talk about how the client can begin letting go of the daughter somewhat in trusting her a little more. I reminded him that he said his wellness was connected to his daughters. Understand that his daughter still has only go in terms of dealing with her boyfriend's death and staying clean. We talked at length about what it means to let go. He indicated that he will not let his male friend come over her recall the pill head because she is the one who gave his daughter Scarlette Calico on the night that her boyfriend died. He indicated that the client text to do her friends she was going to rehabilitation for use of heroin and that male friend told her she should not use the heavy drugs. The client indicated that said to him that she was not telling her to abstain from drugs totally therefore he wants the client having little to no contact with a male. He did say he allowed some of the clients friends over to their home this past weekend for contacting out with and sees that as a way to begin to bridge the gap between he and his daughter. He still remains extremely concerned  about her choices and is concerned about the fact that he could lose her because of mistake that she made. I told her that he needs to be as positive and encouraging with her as he can times he feels irritated and agitated with her or his wife that he needs to step away from the situation so he can calm down. The client does have some concerns about returning to work but knows that he has to he'll only have to work for a week before he could use of the balance of his vacation which would take him to the end of 2013. He indicated that Dr. Demetrius Charity. needs to send paperwork back to the ensure some pacing that he is returning to  see Dr. Demetrius Charity. on November 7. I told the client would pass along to Dr. Demetrius Charity. and did so . the client did contract for safety   Plan: Return again in 1 weeks.  Diagnosis: Axis I: 300.02    Axis II: Deferred    Joanie Duprey M, LPC 08/16/2012

## 2012-08-17 ENCOUNTER — Telehealth (HOSPITAL_COMMUNITY): Payer: Self-pay

## 2012-08-17 NOTE — Telephone Encounter (Signed)
Called patient. He reports that he has been feeling tired since starting Cozaar.  Advised him to talk to his PCP.

## 2012-08-23 ENCOUNTER — Encounter (HOSPITAL_COMMUNITY): Payer: Self-pay | Admitting: Behavioral Health

## 2012-08-23 ENCOUNTER — Ambulatory Visit (INDEPENDENT_AMBULATORY_CARE_PROVIDER_SITE_OTHER): Payer: 59 | Admitting: Behavioral Health

## 2012-08-23 ENCOUNTER — Telehealth (HOSPITAL_COMMUNITY): Payer: Self-pay | Admitting: Psychiatry

## 2012-08-23 DIAGNOSIS — F411 Generalized anxiety disorder: Secondary | ICD-10-CM

## 2012-08-23 NOTE — Telephone Encounter (Signed)
Called patient. Informed patient that fax number by disability insurance not receiving fax.

## 2012-08-23 NOTE — Progress Notes (Signed)
   THERAPIST PROGRESS NOTE  Session Time: 9:00  Participation Level: Active  Behavioral Response: CasualAlertAnxious  Type of Therapy: Individual Therapy  Treatment Goals addressed: Coping  Interventions: CBT  Summary: Stephen Myers is a 51 y.o. male who presents with anxiety.   Suicidal/Homicidal: Nowithout intent/plan  Therapist Response: The client stated that the past week had been a mixed bag of emotions for him. He indicated nothing is bothering him the most currently is a way that he is treating his wife. He indicates that he knows that he is very irritable and short tempered with her. He indicates that is not his nature. He is in the past and how much his wife means to him and how he treats her. He cited an example in which she always takes coffee to her in the morning and when he took it this morning she told him that she could not have it so he said that her saying he could have told me otherwise. He indicated that he feels that she has been a bit more negative lately but that he knows most of it is his fault. He knows that she is dealing with the stresses or and her daughter the client dealing with all that he is going on in the effect that may of how her. He indicates that he carries a significant burden of guilt from the way that he has been short with his wife. We talked about thinking through his thoughts before expressing them. This may be a combination medication changes as well as reaching out that is going on. He is not using as an excuse but recognizes this could be potential triggers for his irritability. He has started a new blood pressure medication because he all when making extremely difficult for him to breathe. He indicates that this woman is better but he still has some the same issues and will be making an appointment with Dr. Karie Schwalbe. in the family practice upstairs. He knows that his blood pressure has been high and it concerns him. He did say that relationship with  his daughter has been there over the past few days but he still has difficulty trusting his using it was. To address return to work in the middle of November to see how he does. He does have some concerns about returning to work in particular his speech and how others may perceive that'll respond to that. I did not notice to the client but observed his speech appears to be more fluid with lifestyle ring. There certainly some still stuttering going on. He is clear that the client is thinking clearly per his report but that is not necessarily being translated verbally although it appears to be more consistent. The client does contract for safety. He indicates that he sleeping better and not nearly as groggy in the morning as he has been in the past and credits Dr. Demetrius Charity. with getting his medication in a better place. The client has concerns about relapsing on alcohol though he says he did not drink as much as he has in the past weeks prior. [name] although he does not like the idea of having to adjust to new medication. Plan: Return again in 2 weeks.  Diagnosis: Axis I: 300.02    Axis II: Deferred    Lenora Gomes M, LPC 08/23/2012 9:00

## 2012-08-24 ENCOUNTER — Telehealth (HOSPITAL_COMMUNITY): Payer: Self-pay

## 2012-08-24 NOTE — Telephone Encounter (Signed)
Will fax to new number

## 2012-08-25 ENCOUNTER — Telehealth (HOSPITAL_COMMUNITY): Payer: Self-pay

## 2012-08-25 ENCOUNTER — Telehealth (HOSPITAL_COMMUNITY): Payer: Self-pay | Admitting: Psychiatry

## 2012-08-25 NOTE — Telephone Encounter (Signed)
Stephen Myers at [name] has everything she needs.  He has 23 klonopin

## 2012-08-25 NOTE — Telephone Encounter (Signed)
Called patient. Will adjust clonazepam dosage and may add trazodone for sleep. Will see patient next week.

## 2012-08-26 ENCOUNTER — Encounter (HOSPITAL_COMMUNITY): Payer: Self-pay | Admitting: Behavioral Health

## 2012-08-26 NOTE — Telephone Encounter (Signed)
Acknowledged.

## 2012-08-30 ENCOUNTER — Other Ambulatory Visit (HOSPITAL_COMMUNITY): Payer: Self-pay | Admitting: Psychiatry

## 2012-08-31 ENCOUNTER — Telehealth (HOSPITAL_COMMUNITY): Payer: Self-pay | Admitting: Psychiatry

## 2012-08-31 ENCOUNTER — Ambulatory Visit (HOSPITAL_COMMUNITY): Payer: Self-pay | Admitting: Behavioral Health

## 2012-08-31 DIAGNOSIS — F4001 Agoraphobia with panic disorder: Secondary | ICD-10-CM

## 2012-08-31 MED ORDER — CLONAZEPAM 0.5 MG PO TABS: 0.5000 mg | ORAL_TABLET | Freq: Four times a day (QID) | ORAL | Status: DC | PRN

## 2012-08-31 NOTE — Telephone Encounter (Signed)
Have filled patient patient's clonazepam.

## 2012-09-02 ENCOUNTER — Ambulatory Visit (HOSPITAL_COMMUNITY): Payer: Self-pay | Admitting: Behavioral Health

## 2012-09-08 ENCOUNTER — Encounter (HOSPITAL_COMMUNITY): Payer: Self-pay | Admitting: Behavioral Health

## 2012-09-08 ENCOUNTER — Ambulatory Visit (INDEPENDENT_AMBULATORY_CARE_PROVIDER_SITE_OTHER): Payer: 59 | Admitting: Behavioral Health

## 2012-09-08 DIAGNOSIS — F938 Other childhood emotional disorders: Secondary | ICD-10-CM

## 2012-09-08 NOTE — Progress Notes (Signed)
   THERAPIST PROGRESS NOTE  Session Time: 10:00  Participation Level: Active  Behavioral Response: CasualAlertAnxious  Type of Therapy: Individual Therapy  Treatment Goals addressed: Coping  Interventions: CBT  Summary: Stephen Myers is a 51 y.o. male who presents with anxiety.   Suicidal/Homicidal: Nowithout intent/plan  Therapist Response: The client indicated that he has been thinking a lot about returning to work. He indicated that a few weeks ago he was" gung ho" about returning but as he approaches a possible return date he is become more anxious. He indicated that his speech is significantly better and he is not having the level of anxiety attacks that he has been having to express to specific fears as he thinks about returning. He is indicated that he would become irritated or frustrated want to go off on someone or becomes so frustrated that he would shut down and become a" blubbering idiot." He indicated that he has had an extensive conversations with his boss. He indicates that his boss is telling him that he can come back to work as he wants gradually working his way back into but is unsure if upper management would be okay with that. He indicates right now he has the possibility of going to work for 7 days and in being able to take the rest of the year his vacation of going on long-term disability in which she would make 60% of his salary. He indicated that he would like to hear what myself and Dr. Demetrius Charity. say about that. Dr. Demetrius Charity. will meet with him on Thursday, November 7. We talked about the pros and cons of going back to work. We talked about the possibility of his boss for a coworker riding with him initially she went back to his original job. He indicates that there are no other openings but there may be an internal job available in 6 months. He indicated that a former coworker had a" nervous breakdown and they found a job for him in the house because they value Stephen Myers's employee. He  indicates that he feels badly he did and doesn't like they'll about him but he wants to make the best decision for himself. He still is not certain the fact that he can do his old job. He knows that even working full-time it would take him until March or April to get caught up from where he has been out in the thought is somewhat intimidating. We talked at length about the use of coping skills and how to use his anxiety. He did say he feels the medication combination he is on now is the best he has been. He does contract for safety. I will meet with him again next week and I will talk to Dr. Demetrius Charity. after he meets with the client to see what possible recommendations are for the client.  Plan: Return again in 1 weeks.  Diagnosis: Axis I: 300.02    Axis II: Deferred    Stephen Myers, [facility] 09/08/2012

## 2012-09-09 ENCOUNTER — Ambulatory Visit (INDEPENDENT_AMBULATORY_CARE_PROVIDER_SITE_OTHER): Payer: 59 | Admitting: Sports Medicine

## 2012-09-09 ENCOUNTER — Ambulatory Visit (INDEPENDENT_AMBULATORY_CARE_PROVIDER_SITE_OTHER): Payer: 59 | Admitting: Psychiatry

## 2012-09-09 ENCOUNTER — Encounter (HOSPITAL_COMMUNITY): Payer: Self-pay | Admitting: Psychiatry

## 2012-09-09 ENCOUNTER — Encounter: Payer: Self-pay | Admitting: Sports Medicine

## 2012-09-09 VITALS — BP 134/85 | HR 82 | Ht 76.0 in | Wt 271.0 lb

## 2012-09-09 VITALS — BP 131/84 | HR 103 | Ht 76.0 in | Wt 271.0 lb

## 2012-09-09 DIAGNOSIS — F8081 Childhood onset fluency disorder: Secondary | ICD-10-CM | POA: Insufficient documentation

## 2012-09-09 DIAGNOSIS — I10 Essential (primary) hypertension: Secondary | ICD-10-CM

## 2012-09-09 DIAGNOSIS — F429 Obsessive-compulsive disorder, unspecified: Secondary | ICD-10-CM

## 2012-09-09 DIAGNOSIS — G119 Hereditary ataxia, unspecified: Secondary | ICD-10-CM

## 2012-09-09 DIAGNOSIS — F4310 Post-traumatic stress disorder, unspecified: Secondary | ICD-10-CM

## 2012-09-09 DIAGNOSIS — F4001 Agoraphobia with panic disorder: Secondary | ICD-10-CM

## 2012-09-09 MED ORDER — ARIPIPRAZOLE 10 MG PO TABS: ORAL_TABLET | ORAL | Status: DC

## 2012-09-09 MED ORDER — ASPIRIN EC 81 MG PO TBEC: 81.0000 mg | DELAYED_RELEASE_TABLET | Freq: Every day | ORAL | Status: DC

## 2012-09-09 MED ORDER — NALTREXONE HCL 50 MG PO TABS: 50.0000 mg | ORAL_TABLET | Freq: Every day | ORAL | Status: DC

## 2012-09-09 MED ORDER — LOSARTAN POTASSIUM 50 MG PO TABS: 50.0000 mg | ORAL_TABLET | Freq: Every day | ORAL | Status: DC

## 2012-09-09 NOTE — Progress Notes (Addendum)
Subjective:    CC: Establish care.   HPI: Stephen Myers comes to see [place] as a referral from psychiatry downstairs. He does have a history of hypertension that is relatively uncontrolled, and a history of PTSD for which he's been treated down stairs. Unfortunately, back in June he developed a stutter.  There were no medication changes around the time of the stutter, and it developed gradually during a time of stress with his daughter became addicted to heroin.  He has not noted any other focal neurologic signs or symptoms. He does however note some confusion.    Hypertension: Is currently taking lisinopril, notes some vague breathing difficulties with this.  PTSD: Currently improving with treatment downstairs.  Past medical history, Surgical history, Family history, Social history, Allergies, and medications have been entered into the medical record, reviewed, and no changes needed.   Review of Systems: No headache, visual changes, nausea, vomiting, diarrhea, constipation, dizziness, abdominal pain, skin rash, fevers, chills, night sweats, swollen lymph nodes, weight loss, chest pain, body aches, joint swelling, muscle aches, or shortness of breath.   Objective:    General: Well Developed, well nourished, and in no acute distress.  Neuro: Alert and oriented x3, extra-ocular muscles intact. Cranial nerves II through XII intact, motor, and sensory intact. Positive dysdiadochokinesis on the left side, positive finger dysmetria with intention tremor on the left side, negative Romberg's. Negative pronator drift. Mini-Mental status exam 30 out of 30. HEENT: Normocephalic, atraumatic, pupils equal round reactive to light, neck supple, no masses, no lymphadenopathy, thyroid nonpalpable.  Skin: Warm and dry, no rashes noted.  Cardiac: Regular rate and rhythm, no murmurs rubs or gallops.  Respiratory: Clear to auscultation bilaterally. Not using accessory muscles, speaking in full sentences.  Abdominal: Soft,  nontender, nondistended, positive bowel sounds, no masses, no organomegaly.  Musculoskeletal: Shoulder, elbow, wrist, hip, knee, ankle stable, and with full range of motion.  Impression and Recommendations:    The patient was counselled, risk factors were discussed, anticipatory guidance given.

## 2012-09-09 NOTE — Progress Notes (Signed)
[facility] Follow-up Outpatient Visit  Stephen Myers 09-15-1961  Date: 09/09/2012  HISTORY OF CURRENT ILLNESS:   Stephen Myers is a 51 Y/O male with a past psychiatric history significant for Major Depression, recurrent, moderate. Obsessive Compulsive Disorder, Anxiety Disorder, NOS. The patient is referred for psychiatric services for medication management.  The patient reports he has tapered and stopped venlafaxine. He reports no withdrawal symptoms.  Today the patient endorses social anxiety since highschool.  He reports he feels some of the criticism from his father has stayed with him and affected his interactions with other people in life.  The patient denies current suicidal ideation, intent, or plan. The patient denies any current homicidal ideation, plans or intent. He does reports he continues to ruminate on his daughter's illness, and the fear that she could die. Patient denies auditory hallucinations. Patient denies visual hallucinations. Patient endorses paranoia of people taking advantage of him as he starts to try to work. Patient states sleep is improving with 6 hours. Energy level is low. Patient reports some improvement in symptoms of anhedonia.   Denies any recent episodes consistent with mania, particularly decreased need for sleep with increased energy, grandiosity, impulsivity, hyperverbal and pressured speech, or increased productivity. Denies any recent symptoms consistent with psychosis, particularly auditory or visual hallucinations, thought broadcasting/insertion/withdrawal, or ideas of reference.   The patient reports he is taking his medications and denies any side effects.  He reports anxiety about restarting work but feels the need to attempt to try to restart his job.  He continues to ruminate about failing at his job.  He reports some increasing anxiety but has been exposing himself to greater levels of stress.   Review of Systems  Constitutional:  Negative.  Respiratory: Negative.  Cardiovascular: Negative.  Gastrointestinal: Negative.  Neurological: Negative.   Filed Vitals:   09/09/12 1035  BP: 134/85  Pulse: 82  Height: 6\' 4"  (1.93 m)  Weight: 271 lb (122.925 kg)   Physical Exam  Constitutional: He appears well-developed and well-nourished. No acute physical distress. Patient endorses hopelessness, helplessness and guilt.  Skin: He is not diaphoretic.   HISTORY OF SUICIDAL ACTS AND SELF-HARM:  None.   HISTORY OF VIOLENCE/ASSAULTING OTHERS/LEGAL PROBLEMS:  No current legal issues.   SUBSTANCE USE HISTORY:  Caffeine: Coffee 2 cups per day. Caffeinated Beverages 12 ounces per day.  Nicotine: Patient denies. Has a 30 Pack year history, stopped 2003.  Alcohol: 1-2 drinks daily. Illicit Drugs: Patient denies.   MENTAL ILLNESS AND SUBSTANCE ABUSE IN FAMILY MEMBERS: Reviewed  Psychiatric illness: Father-Bipolar Disorder. Mother-depression.  Substance abuse: Paternal-Alcoholism  Suicides: Patient denied   MILITARY HISTORY:  [employer]   Allergies: No Known Allergies   MEDICAL INFORMATION  Past Medical History  Diagnosis Date  . Irritability and anger     symptoms at home and work  . Anxiety   . Depression    b) CURRENT PRESCRIBED MEDICATIONS:  Current Outpatient Prescriptions on File Prior to Visit  Medication Sig Dispense Refill  . ARIPiprazole (ABILIFY) 5 MG tablet Take one and a half-tablet daily.  45 tablet  1  . clonazePAM (KLONOPIN) 0.5 MG tablet Take 1 tablet (0.5 mg total) by mouth 4 (four) times daily as needed for anxiety.  120 tablet  0  . naproxen (NAPROSYN) 500 MG tablet Take 1 tablet (500 mg total) by mouth 2 (two) times daily as needed.  30 tablet  0  . venlafaxine XR (EFFEXOR-XR) 37.5 MG 24 hr capsule Take  3 capsule on week 1, then 2 capsules on week 2, then 1 capsules on week 3, then 1 capsules every other day for 7 days then stop.  70 capsule  0  . [DISCONTINUED] clomiPRAMINE (ANAFRANIL) 25 MG  capsule Take 1 capsule (25 mg total) by mouth at bedtime.  30 capsule  0    e) CURRENT OTC MEDICATIONS  1) Vitamins  2) Fish oil  3) Vit B   ALLERGIES/ ADVERSE REACTIONS:  1) None   Mental Status Examination/Evaluation:  Objective: Appearance: Fairly Groomed, Patient appears anxious during the interview.   Eye Contact: fair   Speech: Some decrease of difficulty with speech, stuttering  and forming sentencing with increased anxiety.   Volume: Normal   Mood: "anxious."   Affect: Labile   Thought Process: Coherent and Goal Directed   Orientation: Full   Thought Content: Within normal limits with the exception of paranoia regarding people taking advantage of him.   Suicidal Thoughts: Patient denies suicidal ideation, intent or plans today.   Homicidal Thoughts: No   Memory: Immediate; Good  Recent:   Judgement: Good   Insight: Fair   Psychomotor Activity: Normal   Concentration:Fair   Memory: intact immediate 3/3; impaired recent 2/3   Akathisia: No   Handed: Right   AIMS (if indicated): not indicates   Assets:  Desire for Improvement  Financial Resources/Insurance  Housing  Social Support    Lab Results: No results found for this or any previous visit (from the past 48 hour(S)).   SUMMARY AND FORMULATION: Will initiate a trial return to work on 09/21/2012. Spoke with patient's employers, this will be a day that the patient's supervisor can  ASSESSMENT:  Axis I: Post Traumatic Stress disorder, Panic Disorder with Agoraphobia, Obsessive Compulsive Disorder, Rule out Bipolar II Disorder  Axis II: No diagnosis.  Axis III: No diagnosis  Axis IV: Job related stressors, familial stressors.  Axis V: GAF: 48  PLAN:  1. Affirm with the patient that the medications are taken as ordered. Patient expressed understanding of how their medications were to be used.  2. Continue the following psychiatric medications as written prior to this appointment with the following changes:  A)   Will initiate a trial of Naltrexone given concerns about patient's recent alcohol intake. B) Increase Clonazepam 0.5 mg QID, PRN anxiety. C) Will increase  Abilify to 15 mg daily. 3. Therapy: brief supportive therapy provided. Discussed psychosocial stressors. Continue individual outpatient therapy. Continue exposure to increasing level of stress and exposure.  4. Risks and benefits, side effects and alternatives discussed with patient, he was given an opportunity to ask questions about his medication, illness, and treatment. All current psychiatric medications have been reviewed and discussed with the patient and adjusted as clinically appropriate. The patient has been provided an accurate and updated list of the medications being now prescribed.  5. Patient told to call clinic if any problems occur. Patient advised to go to ER if he should develop SI/HI, side effects, or if symptoms worsen. Has crisis numbers to call if needed.  6. As patient will be continued on an atypical antipsychotic at this time, will do labs if not done by PCP.  7. The patient was encouraged to keep PCP appointment, secondary to blood pressure and start medications as advised by PCP.  8. Patient was instructed to return to clinic in 4 weeks.  9. The patient expressed understanding of the plan outlined above and agrees with the plan.  Jacqulyn Cane, MD

## 2012-09-09 NOTE — Assessment & Plan Note (Signed)
On several occasions. Some breathing troubles without cough with lisinopril, will change to losartan. I would like to see him back in 2 weeks for a blood pressure check with me.

## 2012-09-09 NOTE — Assessment & Plan Note (Addendum)
With dysdiadochokinesis, and finger dysmetria on the left side. 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 Am going to do the stroke workup, as well as some blood work. Brain MRI, echo, carotid Dopplers. I would like him to take an aspirin during this process. I would like to see him back to go over the results of his MRI.

## 2012-09-10 ENCOUNTER — Other Ambulatory Visit: Payer: Self-pay | Admitting: *Deleted

## 2012-09-10 DIAGNOSIS — G119 Hereditary ataxia, unspecified: Secondary | ICD-10-CM

## 2012-09-13 ENCOUNTER — Encounter (HOSPITAL_COMMUNITY): Payer: Self-pay | Admitting: Behavioral Health

## 2012-09-13 ENCOUNTER — Telehealth (HOSPITAL_COMMUNITY): Payer: Self-pay | Admitting: Psychiatry

## 2012-09-13 LAB — COMPREHENSIVE METABOLIC PANEL
ALT: 29 U/L (ref 0–53)
AST: 28 U/L (ref 0–37)
BUN: 15 mg/dL (ref 6–23)
Creat: 1.05 mg/dL (ref 0.50–1.35)
Total Bilirubin: 0.6 mg/dL (ref 0.3–1.2)

## 2012-09-13 LAB — RPR

## 2012-09-13 LAB — LIPID PANEL
Cholesterol: 182 mg/dL (ref 0–200)
HDL: 49 mg/dL (ref >39)
LDL Cholesterol: 114 mg/dL — ABNORMAL HIGH (ref 0–99)
Total CHOL/HDL Ratio: 3.7 Ratio
Triglycerides: 93 mg/dL (ref <150)
VLDL: 19 mg/dL (ref 0–40)

## 2012-09-13 LAB — COMPREHENSIVE METABOLIC PANEL WITH GFR
Albumin: 4.6 g/dL (ref 3.5–5.2)
Alkaline Phosphatase: 68 U/L (ref 39–117)
CO2: 30 meq/L (ref 19–32)
Calcium: 9.4 mg/dL (ref 8.4–10.5)
Chloride: 105 meq/L (ref 96–112)
Glucose, Bld: 97 mg/dL (ref 70–99)
Potassium: 4.4 meq/L (ref 3.5–5.3)
Sodium: 140 meq/L (ref 135–145)
Total Protein: 7.2 g/dL (ref 6.0–8.3)

## 2012-09-13 LAB — CBC
HCT: 43.4 % (ref 39.0–52.0)
Hemoglobin: 15.2 g/dL (ref 13.0–17.0)
MCH: 32 pg (ref 26.0–34.0)
MCHC: 35 g/dL (ref 30.0–36.0)
MCV: 91.4 fL (ref 78.0–100.0)
Platelets: 205 K/uL (ref 150–400)
RBC: 4.75 MIL/uL (ref 4.22–5.81)
RDW: 12.6 % (ref 11.5–15.5)
WBC: 5.6 K/uL (ref 4.0–10.5)

## 2012-09-13 LAB — TSH: TSH: 0.593 u[IU]/mL (ref 0.350–4.500)

## 2012-09-13 LAB — SEDIMENTATION RATE: Sed Rate: 1 mm/hr (ref 0–16)

## 2012-09-13 LAB — VITAMIN B12: Vitamin B-12: 706 pg/mL (ref 211–911)

## 2012-09-13 NOTE — Telephone Encounter (Signed)
Called patient. Patient reports a history of falls earlier this year prior to seeing this provider, patient has been sent to PCP for evaluation.

## 2012-09-14 ENCOUNTER — Telehealth: Payer: Self-pay | Admitting: *Deleted

## 2012-09-14 ENCOUNTER — Ambulatory Visit (HOSPITAL_BASED_OUTPATIENT_CLINIC_OR_DEPARTMENT_OTHER)
Admission: RE | Admit: 2012-09-14 | Discharge: 2012-09-14 | Disposition: A | Discharge status: Home / Self Care | Payer: 59 | Source: Ambulatory Visit | Attending: Sports Medicine | Admitting: Sports Medicine

## 2012-09-14 DIAGNOSIS — R279 Unspecified lack of coordination: Secondary | ICD-10-CM | POA: Insufficient documentation

## 2012-09-14 DIAGNOSIS — F985 Adult onset fluency disorder: Secondary | ICD-10-CM | POA: Insufficient documentation

## 2012-09-14 MED ORDER — GADOBENATE DIMEGLUMINE 529 MG/ML IV SOLN
20.0000 mL | Freq: Once | INTRAVENOUS | Status: AC | PRN
  Administered 2012-09-14: 20 mL via INTRAVENOUS

## 2012-09-14 NOTE — Telephone Encounter (Signed)
No precert required per [facility] online ID# [id number] for 2D Echo without contrast.

## 2012-09-15 ENCOUNTER — Other Ambulatory Visit (HOSPITAL_COMMUNITY): Payer: Self-pay | Admitting: *Deleted

## 2012-09-15 ENCOUNTER — Ambulatory Visit (HOSPITAL_BASED_OUTPATIENT_CLINIC_OR_DEPARTMENT_OTHER)
Admission: RE | Admit: 2012-09-15 | Discharge: 2012-09-15 | Disposition: A | Discharge status: Home / Self Care | Payer: 59 | Source: Ambulatory Visit | Attending: Sports Medicine | Admitting: Sports Medicine

## 2012-09-15 DIAGNOSIS — I10 Essential (primary) hypertension: Secondary | ICD-10-CM | POA: Insufficient documentation

## 2012-09-15 DIAGNOSIS — G111 Early-onset cerebellar ataxia: Secondary | ICD-10-CM

## 2012-09-15 DIAGNOSIS — I517 Cardiomegaly: Secondary | ICD-10-CM | POA: Insufficient documentation

## 2012-09-15 DIAGNOSIS — G119 Hereditary ataxia, unspecified: Secondary | ICD-10-CM

## 2012-09-15 LAB — FOLATE: Folate: 12.5 ng/mL (ref >5.4)

## 2012-09-15 NOTE — Progress Notes (Signed)
  Echocardiogram 2D Echocardiogram has been performed.  SI, JACHIM 09/15/2012, 12:52 PM

## 2012-09-16 ENCOUNTER — Telehealth (HOSPITAL_COMMUNITY): Payer: Self-pay

## 2012-09-16 NOTE — Telephone Encounter (Signed)
Called patient's employer regarding return to work date.

## 2012-09-17 ENCOUNTER — Ambulatory Visit (INDEPENDENT_AMBULATORY_CARE_PROVIDER_SITE_OTHER): Payer: 59 | Admitting: Behavioral Health

## 2012-09-17 ENCOUNTER — Encounter (HOSPITAL_COMMUNITY): Payer: Self-pay | Admitting: Behavioral Health

## 2012-09-17 ENCOUNTER — Encounter (HOSPITAL_COMMUNITY): Payer: Self-pay | Admitting: Psychiatry

## 2012-09-17 DIAGNOSIS — F411 Generalized anxiety disorder: Secondary | ICD-10-CM

## 2012-09-17 NOTE — Progress Notes (Signed)
   THERAPIST PROGRESS NOTE  Session Time: 11:00  Participation Level: Active  Behavioral Response: CasualAlertAnxious  Type of Therapy: Individual Therapy  Treatment Goals addressed: Coping  Interventions: CBT  Summary: Stephen Myers is a 51 y.o. male who presents with anxiety.   Suicidal/Homicidal: Nowithout intent/plan  Therapist Response: The client indicated that he has some moderate anxiety related to returning to work on Tuesday, November 19. He indicated that he will not return on Monday because there is some sort of sales meeting and his Transport planner felt it best he not return on that day. He is going to work all of next week and will spend primarily all of the time with his Transport planner who he likes and  has  respect for. He indicates that probably the first couple of days will be spent in the office which does not concerne will be gone for a couple him. He did express some concern about what to tell coworkers to talk about just letting them know that he is help issue is working through in getting better. He does not need to disclose any serious, with his daughter or any more detail about his health issues. 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 He did say that he is Transport planner will go with him for the first couple of days and that they can go into stores in which he is much more comfortable. We talked about awareness in terms of dear physically he is feeling anxiety and we reviewed all of his coping skills to help him deal with anxiety. He feels like the medication is in a pretty good place . Marland Kitchen He also indicated that the blood pressure medication may be causing drowsiness. He has not heard any results back from his MRI or other test. He indicates that they were looking for a possible stroke or mini stroke, early onset of dementia. He also mentioned that they may be concerned about some artery blockage. He indicates  that he is not too concerned about that because he would rather know if there is a physical issue that can be addressed. The client did present with moderate anxiety.  A significant amount of  that is connected to return to work. He promised to use his coping skills prior to and on the day of starting back to work. 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 The client does contract for safety.  Plan: Return again in 2 weeks.  Diagnosis: Axis I: 300.02    Axis II: Deferred Have fun   Stephen Myers, [facility] 09/17/2012

## 2012-09-20 ENCOUNTER — Telehealth (HOSPITAL_COMMUNITY): Payer: Self-pay | Admitting: Psychiatry

## 2012-09-20 NOTE — Telephone Encounter (Signed)
Called patient. Called patient's [employer],

## 2012-09-23 ENCOUNTER — Ambulatory Visit (INDEPENDENT_AMBULATORY_CARE_PROVIDER_SITE_OTHER): Payer: 59 | Admitting: Sports Medicine

## 2012-09-23 ENCOUNTER — Encounter: Payer: Self-pay | Admitting: Sports Medicine

## 2012-09-23 VITALS — BP 134/87 | HR 92 | Wt 272.0 lb

## 2012-09-23 DIAGNOSIS — F8081 Childhood onset fluency disorder: Secondary | ICD-10-CM

## 2012-09-23 DIAGNOSIS — I10 Essential (primary) hypertension: Secondary | ICD-10-CM

## 2012-09-23 DIAGNOSIS — E785 Hyperlipidemia, unspecified: Secondary | ICD-10-CM

## 2012-09-23 MED ORDER — RED YEAST RICE 600 MG PO TABS: 2.0000 | ORAL_TABLET | Freq: Three times a day (TID) | ORAL | Status: DC

## 2012-09-23 NOTE — Progress Notes (Signed)
Subjective:    CC: Followup  HPI: Ataxia, stuttering: I had concern for a cerebellar lesion at the last visit, we did an MRI of his brain which was negative. Stuttering is unchanged.  Hypertension: He's been taking only one half tab daily.  Fatigue: Has noted this since going on naltrexone. Does also note snoring, excessive daytime sleepiness.  Past medical history, Surgical history, Family history, Social history, Allergies, and medications have been entered into the medical record, reviewed, and no changes needed.   Review of Systems: No fevers, chills, night sweats, weight loss, chest pain, or shortness of breath.   Objective:    General: Well Developed, well nourished, and in no acute distress.  Neuro: Alert and oriented x3, extra-ocular muscles intact.  HEENT: Normocephalic, atraumatic, pupils equal round reactive to light, neck supple, no masses, no lymphadenopathy, thyroid nonpalpable.  Skin: Warm and dry, no rashes. Cardiac: Regular rate and rhythm, no murmurs rubs or gallops.  Respiratory: Clear to auscultation bilaterally. Not using accessory muscles, speaking in full sentences.   Impression and Recommendations:

## 2012-09-23 NOTE — Assessment & Plan Note (Signed)
Brain MRI is negative. Stuttering may respond to speech therapy at some point in the future, however currently is likely related to medications versus anxiety. We will revisit this in the future, there is nothing life-threatening present.

## 2012-09-23 NOTE — Assessment & Plan Note (Signed)
Patient is currently using 25 mg, a one half Cozaar tablet. He will increase to one whole tab, I will see him back in 2-3 weeks to recheck blood pressure and make changes in medications as needed

## 2012-09-23 NOTE — Assessment & Plan Note (Signed)
Continue aggressive dietary management. He will also try rice yeast extract over-the-counter. Recheck at the beginning of next year.

## 2012-09-29 ENCOUNTER — Other Ambulatory Visit (HOSPITAL_COMMUNITY): Payer: Self-pay | Admitting: Psychiatry

## 2012-10-04 ENCOUNTER — Telehealth (HOSPITAL_COMMUNITY): Payer: Self-pay

## 2012-10-04 NOTE — Telephone Encounter (Signed)
The client reported that he went to work the week before last but had to be off last week because he could not work 2 weeks a row. He indicated that when he got up to work today he could not make himself go and felt like he was going panic attack. We did through. He did not appear to recognize a trigger other than he feels he lost momentum that he may have daily that we could he did work. He indicates that they are expecting him to work to have basis week and he still feels that he can. We talked about his coping skills for dealing with anxiety we talked about compartmentalizing his concerns. We talked about using a CBT to manage negative thoughts pop into his head. I told him that I would have office staff call him if that cancellation so we can work him in.

## 2012-10-04 NOTE — Telephone Encounter (Signed)
Pt went back to work and then last week they sent him home for the week and he is unable to go to work today and is struggling. Can you please call him. You do not have any appts available this week.

## 2012-10-06 ENCOUNTER — Other Ambulatory Visit (HOSPITAL_COMMUNITY): Payer: Self-pay

## 2012-10-06 DIAGNOSIS — F4001 Agoraphobia with panic disorder: Secondary | ICD-10-CM

## 2012-10-06 MED ORDER — CLONAZEPAM 0.5 MG PO TABS: 0.5000 mg | ORAL_TABLET | Freq: Four times a day (QID) | ORAL | Status: DC | PRN

## 2012-10-06 NOTE — Telephone Encounter (Signed)
Called prescription for clonazepam to [facility]. Called patient and left  A message for the same.

## 2012-10-07 ENCOUNTER — Encounter (HOSPITAL_COMMUNITY): Payer: Self-pay | Admitting: Psychiatry

## 2012-10-07 ENCOUNTER — Ambulatory Visit: Payer: Self-pay | Admitting: Sports Medicine

## 2012-10-07 ENCOUNTER — Ambulatory Visit (INDEPENDENT_AMBULATORY_CARE_PROVIDER_SITE_OTHER): Payer: 59 | Admitting: Psychiatry

## 2012-10-07 VITALS — BP 122/90 | HR 69 | Ht 76.0 in | Wt 264.0 lb

## 2012-10-07 DIAGNOSIS — F4310 Post-traumatic stress disorder, unspecified: Secondary | ICD-10-CM

## 2012-10-07 DIAGNOSIS — F4001 Agoraphobia with panic disorder: Secondary | ICD-10-CM

## 2012-10-07 DIAGNOSIS — F429 Obsessive-compulsive disorder, unspecified: Secondary | ICD-10-CM

## 2012-10-07 MED ORDER — ARIPIPRAZOLE 10 MG PO TABS: 10.0000 mg | ORAL_TABLET | Freq: Every day | ORAL | Status: DC

## 2012-10-07 MED ORDER — NALTREXONE HCL 50 MG PO TABS: 50.0000 mg | ORAL_TABLET | Freq: Every day | ORAL | Status: DC

## 2012-10-07 NOTE — Progress Notes (Signed)
[facility] Follow-up Outpatient Visit  Stephen Myers 25-Jun-1961   Date: 10/07/2012  HISTORY OF CURRENT ILLNESS:   [name] is a 51 Y/O male with a past psychiatric history significant for Major Depression, recurrent, moderate. Obsessive Compulsive Disorder, Anxiety Disorder, NOS. The patient is referred for psychiatric services for medication management.  The patient reports he was able to go to work but only tolerated a very light work load. He is concerned about the level of expectation and the work load that will be required of him when he returns in January. He reports a period of suicidal thoughts without a plan last week without plans or intent related to feeling overwhelmed by the thought of his workload.  The patient denies current suicidal ideation, intent, or plan. The patient denies any current homicidal ideation, plans or intent. Patient denies auditory hallucinations. Patient denies visual hallucinations. The patient denies any paranoia. Patient states sleep is improving with 6 hours per night. Energy level is low. Patient reports some improvement in symptoms of anhedonia.   Denies any recent episodes consistent with mania, particularly decreased need for sleep with increased energy, grandiosity, impulsivity, hyperverbal and pressured speech, or increased productivity. Denies any recent symptoms consistent with psychosis, particularly auditory or visual hallucinations, thought broadcasting/insertion/withdrawal, or ideas of reference.   The patient reports he is taking his medications and denies any side effects.  He reports anxiety about restarting work but feels the need to attempt to try to restart his job.  He continues to ruminate about failing at his job.  He reports some increasing anxiety but has been exposing himself to greater levels of stress.   Review of Systems  Constitutional: Negative.  Respiratory: Negative.  Cardiovascular: Negative.   Gastrointestinal: Negative.  Neurological: Negative.   Filed Vitals:   10/07/12 1137  BP: 122/90  Pulse: 69  Height: 6\' 4"  (1.93 m)  Weight: 264 lb (119.75 kg)   Physical Exam  Constitutional: He appears well-developed and well-nourished. No acute physical distress. Patient endorses hopelessness, helplessness and guilt.  Skin: He is not diaphoretic.   HISTORY OF SUICIDAL ACTS AND SELF-HARM:  None.   HISTORY OF VIOLENCE/ASSAULTING OTHERS/LEGAL PROBLEMS:  No current legal issues.   SUBSTANCE USE HISTORY:  Caffeine: Coffee 3 cups per day. Caffeinated Beverages 12 ounces per day.  Nicotine: Patient denies. ----------------------------------------  Alcohol: 1-2 drinks daily. Illicit Drugs: Patient denies.   MENTAL ILLNESS AND SUBSTANCE ABUSE IN FAMILY MEMBERS: Reviewed  Psychiatric illness: Father-Bipolar Disorder. Mother-depression.  Substance abuse: Paternal-Alcoholism  Suicides: Patient denied   MILITARY HISTORY:  [employer]   Allergies: No Known Allergies   MEDICAL INFORMATION  Past Medical History  Diagnosis Date  . Irritability and anger     symptoms at home and work  . Anxiety   . Depression   . HIV infection    b) CURRENT PRESCRIBED MEDICATIONS:  Current Outpatient Prescriptions on File Prior to Visit  Medication Sig Dispense Refill  . ARIPiprazole (ABILIFY) 10 MG tablet Take one and a half-tablet daily.  30 tablet  1  . aspirin EC 81 MG tablet Take 1 tablet (81 mg total) by mouth daily.  90 tablet  3  . clonazePAM (KLONOPIN) 0.5 MG tablet Take 1 tablet (0.5 mg total) by mouth 4 (four) times daily as needed for anxiety.  120 tablet  0  . losartan (COZAAR) 50 MG tablet Take 1 tablet (50 mg total) by mouth daily.  30 tablet  3  . naltrexone (DEPADE)  50 MG tablet Take 1 tablet (50 mg total) by mouth daily.  30 tablet  1  . Red Yeast Rice 600 MG TABS Take 2 tablets (1,200 mg total) by mouth 3 (three) times daily with meals.      . [DISCONTINUED] clomiPRAMINE  (ANAFRANIL) 25 MG capsule Take 1 capsule (25 mg total) by mouth at bedtime.  30 capsule  0    e) CURRENT OTC MEDICATIONS  1) Vitamins  2) Fish oil  3) Vit B   ALLERGIES/ ADVERSE REACTIONS:  1) None   Mental Status Examination/Evaluation:  Objective: Appearance: Fairly Groomed, Patient appears anxious during the interview.   Eye Contact: fair   Speech: Some decrease of difficulty with speech, stuttering  and forming sentencing with increased anxiety.   Volume: Normal   Mood: "pretty good."   Affect: Full range, congruent   Thought Process: Coherent and Goal Directed   Orientation: Full   Thought Content: Within normal limits with the exception of paranoia regarding people taking advantage of him.   Suicidal Thoughts: Patient denies suicidal ideation, intent or plans today.   Homicidal Thoughts: No   Memory: Immediate; Good  Recent:   Judgement: Good   Insight: Fair   Psychomotor Activity: Normal   Concentration:Fair   Memory: intact immediate 3/3; impaired recent 2/3   Akathisia: No   Handed: Right   AIMS (if indicated): not indicates   Assets:  Desire for Improvement  Financial Resources/Insurance  Housing  Social Support    Lab Results: No results found for this or any previous visit (from the past 48 hour(S)).   SUMMARY AND FORMULATION: Will initiate a trial return to work on 09/21/2012. Spoke with patient's employers, this will be a day that the patient's supervisor can  ASSESSMENT:  Axis I: Post Traumatic Stress disorder, Panic Disorder with Agoraphobia, Obsessive Compulsive Disorder, Rule out Bipolar II Disorder  Axis II: No diagnosis.  Axis III: No diagnosis  Axis IV: Job related stressors, familial stressors.  Axis V: GAF: 49  PLAN:  1. Affirm with the patient that the medications are taken as ordered. Patient expressed understanding of how their medications were to be used.  2. Continue the following psychiatric medications as written prior to this  appointment with the following changes:  A)  Will initiate a trial of Naltrexone given concerns about patient's recent alcohol intake. B) Continue Clonazepam 0.5 mg QID, PRN anxiety. C) Will continue Abilify to 15 mg daily. 3. Therapy: brief supportive therapy provided. Discussed psychosocial stressors. Continue individual outpatient therapy. Continue exposure to increasing level of stress and exposure. EMDR therapy will call myself. 4. Risks and benefits, side effects and alternatives discussed with patient, he was given an opportunity to ask questions about his medication, illness, and treatment. All current psychiatric medications have been reviewed and discussed with the patient and adjusted as clinically appropriate. The patient has been provided an accurate and updated list of the medications being now prescribed.  5. Patient told to call clinic if any problems occur. Patient advised to go to ER if he should develop SI/HI, side effects, or if symptoms worsen. Has crisis numbers to call if needed.  6. As patient will be continued on an atypical antipsychotic at this time, will do labs if not done by PCP.  7. The patient was encouraged to keep PCP appointment, secondary to blood pressure and start medications as advised by PCP.  8. Patient was instructed to return to clinic in 4 weeks.  9. The  patient expressed understanding of the plan outlined above and agrees with the plan.   Jacqulyn Cane, MD

## 2012-10-08 ENCOUNTER — Telehealth (HOSPITAL_COMMUNITY): Payer: Self-pay | Admitting: Psychiatry

## 2012-10-08 NOTE — Telephone Encounter (Signed)
Called patient's wife and informed her about EMDR therapy.

## 2012-10-12 ENCOUNTER — Ambulatory Visit (INDEPENDENT_AMBULATORY_CARE_PROVIDER_SITE_OTHER): Payer: 59 | Admitting: Sports Medicine

## 2012-10-12 ENCOUNTER — Encounter: Payer: Self-pay | Admitting: Sports Medicine

## 2012-10-12 VITALS — BP 135/86 | HR 89 | Wt 277.0 lb

## 2012-10-12 DIAGNOSIS — F8081 Childhood onset fluency disorder: Secondary | ICD-10-CM

## 2012-10-12 DIAGNOSIS — Z0000 Encounter for general adult medical examination without abnormal findings: Secondary | ICD-10-CM | POA: Insufficient documentation

## 2012-10-12 DIAGNOSIS — Z23 Encounter for immunization: Secondary | ICD-10-CM

## 2012-10-12 DIAGNOSIS — Z299 Encounter for prophylactic measures, unspecified: Secondary | ICD-10-CM

## 2012-10-12 DIAGNOSIS — E785 Hyperlipidemia, unspecified: Secondary | ICD-10-CM

## 2012-10-12 DIAGNOSIS — I10 Essential (primary) hypertension: Secondary | ICD-10-CM

## 2012-10-12 NOTE — Assessment & Plan Note (Signed)
This was significantly improved until I brought it up.

## 2012-10-12 NOTE — Assessment & Plan Note (Addendum)
Continue rice yeast extract until recheck in early January. If not sufficiently controlled we will consider a statin.  Worsened slightly, adding Lipitor, recheck in 6 weeks.

## 2012-10-12 NOTE — Progress Notes (Signed)
Subjective:    CC: Followup  HPI: Hypertension: Currently on Cozaar 25 mg daily, no chest pain, visual changes, headache, swelling.  Stuttering: Is improving spontaneously.  PTSD: As following this up closely with psychiatry downstairs.  Past medical history, Surgical history, Family history, Social history, Allergies, and medications have been entered into the medical record, reviewed, and no changes needed.   Review of Systems: No fevers, chills, night sweats, weight loss, chest pain, or shortness of breath.   Objective:    General: Well Developed, well nourished, and in no acute distress.  Neuro: Alert and oriented x3, extra-ocular muscles intact.  HEENT: Normocephalic, atraumatic, pupils equal round reactive to light, neck supple, no masses, no lymphadenopathy, thyroid nonpalpable.  Skin: Warm and dry, no rashes. Cardiac: Regular rate and rhythm, no murmurs rubs or gallops.  Respiratory: Clear to auscultation bilaterally. Not using accessory muscles, speaking in full sentences.  Impression and Recommendations:

## 2012-10-12 NOTE — Assessment & Plan Note (Signed)
We'll go ahead and order colonoscopy. Declines influenza vaccination. Tdap given today.

## 2012-10-12 NOTE — Assessment & Plan Note (Signed)
Under adequate control on 25 mg of Cozaar. We will be checking a CMET, lipid panel, and hemoglobin A1c early in January.

## 2012-10-13 ENCOUNTER — Encounter (HOSPITAL_COMMUNITY): Payer: Self-pay | Admitting: Behavioral Health

## 2012-10-13 ENCOUNTER — Ambulatory Visit (INDEPENDENT_AMBULATORY_CARE_PROVIDER_SITE_OTHER): Payer: 59 | Admitting: Behavioral Health

## 2012-10-13 DIAGNOSIS — F411 Generalized anxiety disorder: Secondary | ICD-10-CM

## 2012-10-13 NOTE — Progress Notes (Signed)
THERAPIST PROGRESS NOTE  Session Time: 10:00  Participation Level: Active  Behavioral Response: CasualAlertAnxious  Type of Therapy: Individual Therapy  Treatment Goals addressed: Coping  Interventions: CBT  Summary: Stephen Myers is a 51 y.o. male who presents with anxiety.   Suicidal/Homicidal: Nowithout intent/plan  Therapist Response: This was the first time I had seen the clients and she started back to work. I did speak with him by phone on one occasion. He indicated that the first week back when okay but because of a work related roll he had to take a week off and they go back to work for 2-1/2-3 days. He indicated he could not come back the first and had a moderate panic attack but was able to go back on a Tuesday and work 3 consecutive days. He indicated for the most part he did spend time with his boss which made the transition easier. He did say that he made some calls only one of which was difficult. 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 He indicated that as expected the store R. challenged pricing the matter how he explained it. He indicated that he felt frustration and anger building up at him but was able to walk away and do something on his laptop to better explained his companies position on the pricing. He indicated that in the past he would've lost his temper but was proud of himself for not doing that. He indicated that he has been able to manage his anxiety and his stuttering has not been a bit of an issue. He indicated that he does medication is helping but he is attempting to compartmentalize issues at work so they do not become overwhelming and is tackling the smallest things first and so as not to become overwhelmed. He did indicate that he felt his irritability at home have decreased some since going back to work in particular with his wife. He indicated  that she has been reaching out to him even through his irritability. He indicated that he has not responded as much as she has initiated that he recognizes that  is going to make an effort to respond more to her supportive nature. He did indicate that he and his wife have one conflict which was directly related to an incident with his daughter. He stated that his daughter used his wife's credit card for gas and turned out that she used several $100 more on the credit card without telling her parents. He indicated that he became frustrated because his wife said she understood that shopping to be very therapeutic. The client indicated that he understands at some level but he felt that the daughter spending several $100 of their money was distressful and that he will make her pay it back. He indicated that he felt like outlining needs to draw. We talked at length about his relationship with her about rebuilding trust look like. 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 He did say he would tolerate the use of marijuana and is made her aware of that as long as she is not doing anything else. The client stuttering appeared to be minimal during the session. He did report some anxiety and returning to work but states this time off between now and the end of the year feels more  like vacation is a 40 return to work. I will see him again prior to returning to work on January 2. The client does contract for safety.  Plan: Return again in 2 weeks.  Diagnosis: Axis I: 300.02    Axis II: Deferred    [name], [facility] 10/13/2012

## 2012-10-25 ENCOUNTER — Encounter (HOSPITAL_COMMUNITY): Payer: Self-pay | Admitting: Behavioral Health

## 2012-10-25 ENCOUNTER — Ambulatory Visit (INDEPENDENT_AMBULATORY_CARE_PROVIDER_SITE_OTHER): Payer: 59 | Admitting: Behavioral Health

## 2012-10-25 DIAGNOSIS — F411 Generalized anxiety disorder: Secondary | ICD-10-CM

## 2012-10-25 NOTE — Progress Notes (Signed)
   THERAPIST PROGRESS NOTE  Session Time: 11:00  Participation Level: Active  Behavioral Response: CasualAlertAnxious  Type of Therapy: Individual Therapy  Treatment Goals addressed: Coping  Interventions: CBT  Summary: Stephen Myers is a 51 y.o. male who presents with anxiety and depression.   Suicidal/Homicidal: Nowithout intent/plan  Therapist Response: The client indicated that he felt he had done pretty well over the past couple weeks. 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 He indicated that there was a retirement dinner for him. 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 He felt like that was an accomplishment and attempting to gradually exposed himself to social situations that make him anxious. He indicated that Dr. Demetrius Charity. recommended him to a therapist in [place]  who does EMDR. I told him from what I knew of the therapy that I felt he could benefit from that and we looked up with that therapy isn't talked about that somewhat. I told him it was use a lot with people who had suffered from PTSD. He agreed that he felt that can be helpful and he would contact to person the Dr. Demetrius Charity. had recommended to him. He also wanted to clarify that I felt that he was dealing with depression. I told him that I thought it was clear he was doing with depression and anxiety and that is what we were continuing to treat. We talked about the progress that he was making. We talked about the degree also think that he is frustrated with. The client is much more aware of his irritability and how he expresses that. He indicates that between the coping skills and medication he feels he is become aware of things and although he is not perfect feels he is making strides and how he responds to situations that irritate him. We also talked about where his social  anxiety comes from, how it feels and physically, mentally, and emotionally and some coping skills to help him deal with that. We also talked about getting back to do things to bring him" happiness" including playing golf with friends and having dates with his wife. The client does contract for safety saying he has no thoughts of hurting himself or anyone else. I will see him again in one week prior to him returning to work.  Plan: Return again in 1 weeks.  Diagnosis: Axis I: 296.32/300.02    Axis II: Deferred    [name], [name] 10/25/2012

## 2012-11-01 LAB — LIPID PANEL
Cholesterol: 179 mg/dL (ref 0–200)
HDL: 39 mg/dL — ABNORMAL LOW (ref >39)
LDL Cholesterol: 115 mg/dL — ABNORMAL HIGH (ref 0–99)
Total CHOL/HDL Ratio: 4.6 ratio
Triglycerides: 125 mg/dL (ref <150)
VLDL: 25 mg/dL (ref 0–40)

## 2012-11-01 LAB — COMPREHENSIVE METABOLIC PANEL WITH GFR
ALT: 24 U/L (ref 0–53)
Alkaline Phosphatase: 66 U/L (ref 39–117)
Creat: 0.97 mg/dL (ref 0.50–1.35)
Sodium: 141 meq/L (ref 135–145)
Total Bilirubin: 0.6 mg/dL (ref 0.3–1.2)
Total Protein: 6.9 g/dL (ref 6.0–8.3)

## 2012-11-01 LAB — COMPREHENSIVE METABOLIC PANEL
AST: 21 U/L (ref 0–37)
Albumin: 4.6 g/dL (ref 3.5–5.2)
BUN: 16 mg/dL (ref 6–23)
CO2: 28 mEq/L (ref 19–32)
Calcium: 9.2 mg/dL (ref 8.4–10.5)
Chloride: 106 mEq/L (ref 96–112)
Glucose, Bld: 103 mg/dL — ABNORMAL HIGH (ref 70–99)
Potassium: 4.5 mEq/L (ref 3.5–5.3)

## 2012-11-01 LAB — CBC
HCT: 42.8 % (ref 39.0–52.0)
Hemoglobin: 14.8 g/dL (ref 13.0–17.0)
MCH: 31.5 pg (ref 26.0–34.0)
MCHC: 34.6 g/dL (ref 30.0–36.0)
MCV: 91.1 fL (ref 78.0–100.0)
Platelets: 194 10*3/uL (ref 150–400)
RBC: 4.7 MIL/uL (ref 4.22–5.81)
RDW: 13.3 % (ref 11.5–15.5)
WBC: 4.8 10*3/uL (ref 4.0–10.5)

## 2012-11-01 LAB — HEMOGLOBIN A1C
Hgb A1c MFr Bld: 5.4 % (ref <5.7)
Mean Plasma Glucose: 108 mg/dL (ref <117)

## 2012-11-02 ENCOUNTER — Encounter (HOSPITAL_COMMUNITY): Payer: Self-pay | Admitting: Behavioral Health

## 2012-11-02 ENCOUNTER — Ambulatory Visit (INDEPENDENT_AMBULATORY_CARE_PROVIDER_SITE_OTHER): Payer: 59 | Admitting: Behavioral Health

## 2012-11-02 DIAGNOSIS — F411 Generalized anxiety disorder: Secondary | ICD-10-CM

## 2012-11-02 MED ORDER — ATORVASTATIN CALCIUM 40 MG PO TABS: 40.0000 mg | ORAL_TABLET | Freq: Every day | ORAL | Status: DC

## 2012-11-02 NOTE — Addendum Note (Signed)
Addended by: Monica Becton on: 11/02/2012 04:44 PM   Modules accepted: Orders

## 2012-11-02 NOTE — Progress Notes (Signed)
   THERAPIST PROGRESS NOTE  Session Time: 8:00  Participation Level: Active  Behavioral Response: CasualAlertAnxious  Type of Therapy: Individual Therapy  Treatment Goals addressed: Coping  Interventions: CBT  Summary: Stephen Myers is a 51 y.o. male who presents with anxiety.   Suicidal/Homicidal: Nowithout intent/plan  Therapist Response: The client indicated that he had a dream that he was at work in the office and that felt good but in reality he is experiencing some anxiety related to returning to work on January 2. 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 We talked about responsibility to get one and the one asked him what has been going on. He particular did not want to answer questions about why he was out for about his daughter. He decided to just tell them that he has some health issues and he is doing better and that his daughter is doing well. I told him he did note anyone else more explanation than that. We talked at length about the sources of anxiety for work. He indicates a part of it is fear for not being able to do the job in the way that he has done in the past. He does not question his work ethic or abilities but has some anxiety and fear related to possible confrontations. 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 He also indicated that he see significant improvement in his daughter and felt that when he started to see that he would instantly be better. He recognizes that what he is going through is more than just about his daughter about dealing with past issues. He is scheduled to see Dub Amis who practices EMDR therapy on Friday, January 3. He understands that therapy targets PTSD and understands that he may have to deal some with his past told me that she told him that he would probably be no more than 6 or 7 sessions. He admits some anxiety related to a but also reports being excited about the difference I can make it is fears related to work. The client indicates that his medications to make a positive difference. He does contract for safety saying he has no thoughts of hurting himself or anyone else. I will see him again in 2 weeks   Plan: Return again in 2 weeks.  Diagnosis: Axis I: 300.02    Axis II: Deferred    [name], [facility] 11/02/2012

## 2012-11-03 ENCOUNTER — Other Ambulatory Visit (HOSPITAL_COMMUNITY): Payer: Self-pay | Admitting: Psychiatry

## 2012-11-09 ENCOUNTER — Ambulatory Visit: Payer: Self-pay | Admitting: Sports Medicine

## 2012-11-10 ENCOUNTER — Telehealth (HOSPITAL_COMMUNITY): Payer: Self-pay

## 2012-11-10 DIAGNOSIS — F4001 Agoraphobia with panic disorder: Secondary | ICD-10-CM

## 2012-11-10 MED ORDER — CLONAZEPAM 0.5 MG PO TABS: 0.5000 mg | ORAL_TABLET | Freq: Four times a day (QID) | ORAL | Status: DC | PRN

## 2012-11-10 NOTE — Telephone Encounter (Signed)
NEEDS CL[facility]PAM CALLED INTO [facility] [address]

## 2012-11-10 NOTE — Telephone Encounter (Signed)
Called in prescription of clonazepam 0.5 mg QID.

## 2012-11-16 ENCOUNTER — Ambulatory Visit (INDEPENDENT_AMBULATORY_CARE_PROVIDER_SITE_OTHER): Payer: 59 | Admitting: Behavioral Health

## 2012-11-16 ENCOUNTER — Encounter (HOSPITAL_COMMUNITY): Payer: Self-pay | Admitting: Behavioral Health

## 2012-11-16 DIAGNOSIS — F411 Generalized anxiety disorder: Secondary | ICD-10-CM

## 2012-11-16 NOTE — Progress Notes (Signed)
   THERAPIST PROGRESS NOTE  Session Time: 11:00  Participation Level: Active  Behavioral Response: NeatAlertAnxious  Type of Therapy: Individual Therapy  Treatment Goals addressed: Coping  Interventions: CBT  Summary: Stephen Myers is a 52 y.o. male who presents with anxiety.   Suicidal/Homicidal: Nowithout intent/plan  Therapist Response: The client indicated that his last 1-1/2 weeks at work had been fairly positive. 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 He indicates change toward her easier and to put up as much a fight about pricing structure as to independence. 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 He also indicated that he felt his manager would go in with him some stores this week and into next week for some of the changes that had taken place since he has been out. He did report he continues to fight feeling that he is falling further behind saying that the expected number of visits per day is a 10 he is only getting 4 or 5. He says he does feel supported by a Transport planner and tells him that as long as he is in his territory by starting time he can take time to make the first call. The client does indicate that the first called the day his own decreased most anxiety. He indicates that he is using cognitive behavioral therapy to tell himself that he can do this. We talked at length about the client getting himself Delorise Shiner and said being so hard on himself based on standard that he established but that no one expects of him. We reviewed his coping skills to use for dressing anxiety. He does contract for safety saying he has no thoughts of hurting himself or anyone  else. He estimated his anxiety level is about a 4 on average on a scale of 10 but indicates it does spike when he has to go into a store at least the first one of the day. He did say a couple of coworkers asking how he was doing. He indicated he gave him a simple answer of the had not been well but was doing much better and he did not ask any additional questions.  Plan: Return again in 2 weeks.  Diagnosis: Axis I: 300.02    Axis II: Deferred    Stephen Myers, [facility] 11/16/2012

## 2012-11-19 ENCOUNTER — Ambulatory Visit (INDEPENDENT_AMBULATORY_CARE_PROVIDER_SITE_OTHER): Payer: 59 | Admitting: Psychiatry

## 2012-11-19 ENCOUNTER — Encounter (HOSPITAL_COMMUNITY): Payer: Self-pay | Admitting: Psychiatry

## 2012-11-19 VITALS — BP 125/91 | HR 81 | Ht 76.0 in | Wt 280.0 lb

## 2012-11-19 DIAGNOSIS — F4310 Post-traumatic stress disorder, unspecified: Secondary | ICD-10-CM

## 2012-11-19 DIAGNOSIS — F4001 Agoraphobia with panic disorder: Secondary | ICD-10-CM

## 2012-11-19 DIAGNOSIS — F429 Obsessive-compulsive disorder, unspecified: Secondary | ICD-10-CM

## 2012-11-19 MED ORDER — ARIPIPRAZOLE 10 MG PO TABS: 10.0000 mg | ORAL_TABLET | Freq: Every day | ORAL | Status: DC

## 2012-11-19 MED ORDER — NALTREXONE HCL 50 MG PO TABS: 50.0000 mg | ORAL_TABLET | Freq: Every day | ORAL | Status: DC

## 2012-11-19 NOTE — Progress Notes (Signed)
[facility] Behavioral Health Follow-up Outpatient Visit  Stephen Myers 1961-04-09  Date: 11/19/2012  HISTORY OF CURRENT ILLNESS:   Stephen Myers is a 52 Y/O male with a past psychiatric history significant for Major Depression, recurrent, moderate. Obsessive Compulsive Disorder, Anxiety Disorder, NOS. The patient is referred for psychiatric services for medication management.  The patient reports that he has been working.  He states that he has been taking one clonazepam in the morning and 3 clonazepam at bedtime. He states he has been going to work and tolerating increasing stress.  He has good and bad days, he denies any side effects. He states that he has tolerated being in [place] without having a panic attack. He is currently in the initial stages of EMDR therapy.  He current stressors, other than his perceived inadequacy at work, is his concern for his daughter's health.  The patient denies current suicidal ideation, intent, or plan. The patient denies any current homicidal ideation, plans or intent. Patient denies auditory hallucinations. Patient denies visual hallucinations. The patient denies any paranoia. Patient states sleep is improving with 6 hours per night. Energy level is low. Patient reports some improvement in symptoms of anhedonia.   Denies any recent episodes consistent with mania, particularly decreased need for sleep with increased energy, grandiosity, impulsivity, hyperverbal and pressured speech, or increased productivity. Denies any recent symptoms consistent with psychosis, particularly auditory or visual hallucinations, thought broadcasting/insertion/withdrawal, or ideas of reference.   The patient reports he is taking his medications and denies any side effects.  He reports anxiety about restarting work but feels the need to attempt to try to restart his job.  He continues to ruminate about failing at his job.  He reports some increasing anxiety but has been exposing himself  to greater levels of stress.   Review of Systems  Constitutional: Negative.  Respiratory: Negative.  Cardiovascular: Negative.  Gastrointestinal: Negative.  Neurological: Negative.   Filed Vitals:   11/19/12 1539  BP: 125/91  Pulse: 81  Height: 6\' 4"  (1.93 m)  Weight: 280 lb (127.007 kg)   Physical Exam  Constitutional: He appears well-developed and well-nourished. No acute physical distress. Patient endorses hopelessness, helplessness and guilt.  Skin: He is not diaphoretic.   HISTORY OF SUICIDAL ACTS AND SELF-HARM:  None.   HISTORY OF VIOLENCE/ASSAULTING OTHERS/LEGAL PROBLEMS:  No current legal issues.   SUBSTANCE USE HISTORY:  Caffeine: Coffee 3 cups per day. Caffeinated Beverages 12 ounces per day.  Nicotine: Patient denies.  Alcohol: 1-2 drinks daily.Illicit  Drugs: Patient denies.   MENTAL ILLNESS AND SUBSTANCE ABUSE IN FAMILY MEMBERS: Reviewed  Psychiatric illness: Father-Bipolar Disorder. Mother-depression.  Substance abuse: Paternal-Alcoholism  Suicides: Patient denied   MILITARY HISTORY:  [employer]   Allergies: No Known Allergies   MEDICAL INFORMATION  Past Medical History  Diagnosis Date  . Irritability and anger     symptoms at home and work  . Anxiety   . Depression   . HIV infection    b) CURRENT PRESCRIBED MEDICATIONS:  Current Outpatient Prescriptions on File Prior to Visit  Medication Sig Dispense Refill  . ARIPiprazole (ABILIFY) 10 MG tablet Take 1 tablet (10 mg total) by mouth daily.  30 tablet  1  . aspirin EC 81 MG tablet Take 1 tablet (81 mg total) by mouth daily.  90 tablet  3  . atorvastatin (LIPITOR) 40 MG tablet Take 1 tablet (40 mg total) by mouth daily.  90 tablet  3  . clonazePAM (KLONOPIN) 0.5 MG  tablet Take 1 tablet (0.5 mg total) by mouth 4 (four) times daily as needed for anxiety.  120 tablet  0  . losartan (COZAAR) 50 MG tablet Take 1 tablet (50 mg total) by mouth daily.  30 tablet  3  . naltrexone (DEPADE) 50 MG tablet Take  1 tablet (50 mg total) by mouth daily.  30 tablet  1  . Red Yeast Rice 600 MG TABS Take 2 tablets (1,200 mg total) by mouth 3 (three) times daily with meals.      . [DISCONTINUED] clomiPRAMINE (ANAFRANIL) 25 MG capsule Take 1 capsule (25 mg total) by mouth at bedtime.  30 capsule  0    e) CURRENT OTC MEDICATIONS  1) Vitamins  2) Fish oil  3) Vit B   ALLERGIES/ ADVERSE REACTIONS:  1) None   Mental Status Examination/Evaluation:  Objective: Appearance: Fairly Groomed, Patient appears anxious during the interview.   Eye Contact: fair   Speech: Some decrease of difficulty with speech, stuttering  and forming sentencing with increased anxiety.   Volume: Normal   Mood: "Crappy until my last work call, today." 6/10  Affect: Full range, congruent   Thought Process: Coherent and Goal Directed   Orientation: Full   Thought Content: Within normal limits with the exception of paranoia regarding people taking advantage of him.   Suicidal Thoughts: Patient denies suicidal ideation, intent or plans today.   Homicidal Thoughts: No   Memory: Immediate; Good  Recent:   Judgement: Good   Insight: Fair   Psychomotor Activity: Normal   Concentration:Fair   Memory: intact immediate 3/3; impaired recent 2/3   Akathisia: No   Handed: Right   AIMS (if indicated): not indicates   Assets:  Desire for Improvement  Financial Resources/Insurance  Housing  Social Support    Lab Results: No results found for this or any previous visit (from the past 48 hour(S)).   SUMMARY AND FORMULATION: Will initiate a trial return to work on 09/21/2012. Spoke with patient's employers, this will be a day that the patient's supervisor can  ASSESSMENT:  Axis I: Post Traumatic Stress disorder, Panic Disorder with Agoraphobia, Obsessive Compulsive Disorder, Rule out Bipolar II Disorder  Axis II: No diagnosis.  Axis III: No diagnosis  Axis IV: Job related stressors, familial stressors.  Axis V: GAF: 49  PLAN:  1.  Affirm with the patient that the medications are taken as ordered. Patient expressed understanding of how their medications were to be used.  2. Continue the following psychiatric medications as written prior to this appointment with the following changes:  A) Continue naltrexone 50 mg daily B) Decrease Clonazepam 0.5 mg TID, PRN anxiety, patient has already refilled clonazepam on 11/11/2011, #120. No refills today, will ascertain if he can tolerate further decrease prior to filling any further medications. C) Will continue Abilify to 10 mg daily. 3. Therapy: brief supportive therapy provided. Discussed psychosocial stressors. Continue individual outpatient therapy. Continue exposure to increasing level of stress and exposure. EMDR therapy will call myself. 4. Risks and benefits, side effects and alternatives discussed with patient, he was given an opportunity to ask questions about his medication, illness, and treatment. All current psychiatric medications have been reviewed and discussed with the patient and adjusted as clinically appropriate. The patient has been provided an accurate and updated list of the medications being now prescribed.  5. Patient told to call clinic if any problems occur. Patient advised to go to ER if he should develop SI/HI, side effects, or  if symptoms worsen. Has crisis numbers to call if needed.  6. As patient will be continued on an atypical antipsychotic at this time, will do labs if not done by PCP.  7. The patient was encouraged to keep PCP appointment, secondary to blood pressure and start medications as advised by PCP.  8. Patient was instructed to return to clinic in 4 weeks.  9. The patient expressed understanding of the plan outlined above and agrees with the plan.   Jacqulyn Cane, MD

## 2012-11-23 ENCOUNTER — Ambulatory Visit: Payer: Self-pay | Admitting: Sports Medicine

## 2012-11-23 DIAGNOSIS — Z0289 Encounter for other administrative examinations: Secondary | ICD-10-CM

## 2012-12-06 ENCOUNTER — Ambulatory Visit (HOSPITAL_COMMUNITY): Payer: Self-pay | Admitting: Behavioral Health

## 2012-12-11 ENCOUNTER — Other Ambulatory Visit (HOSPITAL_COMMUNITY): Payer: Self-pay | Admitting: Psychiatry

## 2012-12-16 ENCOUNTER — Other Ambulatory Visit (HOSPITAL_COMMUNITY): Payer: Self-pay | Admitting: Psychiatry

## 2012-12-20 ENCOUNTER — Encounter (HOSPITAL_COMMUNITY): Payer: Self-pay | Admitting: Behavioral Health

## 2012-12-20 ENCOUNTER — Ambulatory Visit (INDEPENDENT_AMBULATORY_CARE_PROVIDER_SITE_OTHER): Payer: 59 | Admitting: Behavioral Health

## 2012-12-20 ENCOUNTER — Telehealth (HOSPITAL_COMMUNITY): Payer: Self-pay

## 2012-12-20 DIAGNOSIS — F411 Generalized anxiety disorder: Secondary | ICD-10-CM

## 2012-12-20 DIAGNOSIS — F4001 Agoraphobia with panic disorder: Secondary | ICD-10-CM

## 2012-12-20 NOTE — Telephone Encounter (Signed)
PT HAS BEEN TRYING TO GET REFILL OF CLONAZEPAM SINCE LAST WEEK. WE [facility]. [facility] [address] [facility]

## 2012-12-20 NOTE — Progress Notes (Signed)
   THERAPIST PROGRESS NOTE  Session Time: 8:00  Participation Level: Active  Behavioral Response: CasualAlertAnxious  Type of Therapy: Individual Therapy  Treatment Goals addressed: Coping  Interventions: CBT  Summary: Stephen Myers is a 52 y.o. male who presents with anxiety.   Suicidal/Homicidal: Nowithout intent/plan  Therapist Response: I have not seen the client in one month do to a rescheduling his appointment from 2 weeks ago. Apologize for having to reschedule his appointment do to a meeting that was scheduled for me. He indicated that for the most part he done well but had noticed since tapering off of his Klonopin that his anxiety had gone up. He indicated that he had not had any panic attacks but was starting to see more increased symptoms of the anxiety. He indicated that because of the inclement weather last week he also did not get a refill on the Klonopin but left a message with Victorino Dike to have Dr. Demetrius Charity. call him to review the prescription. He reported that for the most part he feels he is dealt with his anxiety well as using his coping skills of writing and praying as well as walking to help him alleviate the anxiety. 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 He indicated that his boss to travel with him last week which help reduce anxiety but recognize that he did much better job of not becoming frustrated or angry with him if they attempted to argue with him. He also indicated that his daughter did get caught for marijuana and paraphernalia possession after leaving the place where she buys marijuana. He indicated that he is concerned also is not letting it control him as he would have in the past. He stated that his daughter came to him and told him what happened and he did not get upset and he recognizes how much he has improved over the past few months as well as the past year. We also  talked about continuing to compartmentalize. He says that he has looked at work in terms of he can possibly retire in 4 years as a big picture but is also seeing work as day-to-day which has reduced her stress load also. He indicates that he is sleeping better and the use of breath prayers has reduce anxiety as well as helped him sleep. He also recognize that work is not be" end all" that it used to be for him. The client does contract for safety saying yes or thoughts of hurting himself or anyone else.  Plan: Return again in 2 weeks.  Diagnosis: Axis I: 300.02    Axis II: Deferred    [name], [facility] 12/20/2012

## 2012-12-21 MED ORDER — CLONAZEPAM 0.5 MG PO TABS: 0.5000 mg | ORAL_TABLET | Freq: Three times a day (TID) | ORAL | Status: DC | PRN

## 2012-12-21 NOTE — Telephone Encounter (Addendum)
Called in prescription for clonazepam.   PLAN: Clonazepam: Take one tablet TID.

## 2012-12-21 NOTE — Addendum Note (Signed)
Addended by: Larena Sox on: 12/21/2012 11:42 AM   Modules accepted: Orders

## 2013-01-12 ENCOUNTER — Ambulatory Visit (INDEPENDENT_AMBULATORY_CARE_PROVIDER_SITE_OTHER): Payer: 59 | Admitting: Behavioral Health

## 2013-01-12 ENCOUNTER — Encounter (HOSPITAL_COMMUNITY): Payer: Self-pay | Admitting: Behavioral Health

## 2013-01-12 DIAGNOSIS — F411 Generalized anxiety disorder: Secondary | ICD-10-CM

## 2013-01-12 NOTE — Progress Notes (Signed)
   THERAPIST PROGRESS NOTE  Session Time: 11:00  Participation Level: Active  Behavioral Response: CasualAlertAnxious  Type of Therapy: Individual Therapy  Treatment Goals addressed: Coping  Interventions: CBT  Summary: Stephen Myers is a 52 y.o. male who presents with anxiety.   Suicidal/Homicidal: Nowithout intent/plan  Therapist Response: The client indicated that he has mixed emotions about how he is doing in terms of working. 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 He states own his worst days he starts thinking about the next day the night before he continues in the morning and on the best days is minimal anxiety is comfortable making Stephen Myers. He recognizes that he is making progress and although he is frustrated with it does say that they're more good days and bad days. He said when they're bad days and feels like anxiety is worse when her good days it does not feel as significant. He did express some concern about traveling with his senior manager on Monday the 17th. He indicates that he is always looked up to his senior manager understands Stephen Myers as a work home because of the amount of volume and knowledge that he retains in the matter energy that he possesses. He indicates that many of his peers are anxious of working with his Pensions consultant and he has never been told this time. He indicated that his biggest fear is that his Pensions consultant will see that he is not as strong as he used to be in a somewhat fearful of what the senior manager will say. He states that he never says that directly but instead tells the clients Psychologist, educational or will send an e-mail. He stated that even in his typically constructive in a critical said he does not understand his anxiety. We spent time unpacking that finding that the client really does  holds himself to a standard at times is impossible to maintain PR moderate him this is a worker progress and he is making a lot positive assessment depression and to get himself some grace. He did say that he is using breath prayers and quiet time is calming skills we talked about setting up a hierarchy of coping skills that he can check off all times he was anxious starting with the breath prayers. He agreed to make a mental checklist or hierarchy an attempt to continue improving on that in practicing it. He does contract for safety saying yes or thoughts of hurting himself or anyone else.  Plan: Return again in 2 weeks.  Diagnosis: Axis I: 300.02    Axis II: Deferred    Stephen Myers, [facility] 01/12/2013

## 2013-01-13 ENCOUNTER — Other Ambulatory Visit (HOSPITAL_COMMUNITY): Payer: Self-pay | Admitting: Psychiatry

## 2013-01-17 ENCOUNTER — Ambulatory Visit (HOSPITAL_COMMUNITY): Payer: Self-pay | Admitting: Psychiatry

## 2013-01-17 ENCOUNTER — Telehealth (HOSPITAL_COMMUNITY): Payer: Self-pay

## 2013-01-17 DIAGNOSIS — F4310 Post-traumatic stress disorder, unspecified: Secondary | ICD-10-CM

## 2013-01-17 MED ORDER — ARIPIPRAZOLE 10 MG PO TABS: 10.0000 mg | ORAL_TABLET | Freq: Every day | ORAL | Status: DC

## 2013-01-17 NOTE — Telephone Encounter (Signed)
Refill request received. Filled the same.

## 2013-01-18 ENCOUNTER — Ambulatory Visit (INDEPENDENT_AMBULATORY_CARE_PROVIDER_SITE_OTHER): Payer: 59 | Admitting: Psychiatry

## 2013-01-18 ENCOUNTER — Encounter (HOSPITAL_COMMUNITY): Payer: Self-pay | Admitting: Psychiatry

## 2013-01-18 VITALS — BP 127/86 | HR 75 | Ht 76.0 in | Wt 276.0 lb

## 2013-01-18 DIAGNOSIS — F4001 Agoraphobia with panic disorder: Secondary | ICD-10-CM

## 2013-01-18 DIAGNOSIS — F4310 Post-traumatic stress disorder, unspecified: Secondary | ICD-10-CM

## 2013-01-18 DIAGNOSIS — F429 Obsessive-compulsive disorder, unspecified: Secondary | ICD-10-CM

## 2013-01-18 MED ORDER — NALTREXONE HCL 50 MG PO TABS: 50.0000 mg | ORAL_TABLET | Freq: Every day | ORAL | Status: DC

## 2013-01-18 MED ORDER — ARIPIPRAZOLE 10 MG PO TABS: 10.0000 mg | ORAL_TABLET | Freq: Every day | ORAL | Status: DC

## 2013-01-18 MED ORDER — CLONAZEPAM 0.5 MG PO TABS: 0.5000 mg | ORAL_TABLET | Freq: Three times a day (TID) | ORAL | Status: DC | PRN

## 2013-01-18 NOTE — Progress Notes (Signed)
[facility] Behavioral Health Follow-up Outpatient Visit  Stephen Myers 1961/04/10  Date: 01/18/2013  HISTORY OF CURRENT ILLNESS:   Stephen Myers is a 52 Y/O male with a past psychiatric history significant for Major Depression, recurrent, moderate. Obsessive Compulsive Disorder, Anxiety Disorder, NOS. The patient is referred for psychiatric services for medication management.  The patient reports he met his son this weekend, which is helpful for him. He reports he continues to have some difficulty at work but has been working through stressors. 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 He endorses that his EMDR sessions continues to be helpful and has talked about taking his daughter for treatment. His still has concerns about his daughter's mental health.  He reports having an episode of elevated blood pressures then remembering that he had forgotten to take his blood pressures. He reports he is taking his medications and denies any other side effects.   The patient denies current suicidal ideation, intent, or plan. The patient denies any current homicidal ideation, plans or intent. Patient denies auditory hallucinations. Patient denies visual hallucinations. The patient denies any paranoia. Patient states sleep is good with 6 hours per night. Energy level is fair. Patient reports some continued improvement in symptoms of anhedonia.   Denies any recent episodes consistent with mania, particularly decreased need for sleep with increased energy, grandiosity, impulsivity, hyperverbal and pressured speech, or increased productivity. Denies any recent symptoms consistent with psychosis, particularly auditory or visual hallucinations, thought broadcasting/insertion/withdrawal, or ideas of reference.   The patient reports he is taking his medications and denies any side effects.  He reports anxiety about restarting work but feels the need to attempt to try to restart his job.  He  continues to ruminate about failing at his job.  He reports some increasing anxiety but has been exposing himself to greater levels of stress.   Review of Systems  Constitutional: Negative.  Respiratory: Negative.  Cardiovascular: Negative.  Gastrointestinal: Negative.  Neurological: Negative.   Filed Vitals:   01/18/13 1019  BP: 127/86  Pulse: 75  Height: 6\' 4"  (1.93 m)  Weight: 276 lb (125.193 kg)   Physical Exam  Constitutional: He appears well-developed and well-nourished. No acute physical distress. Patient endorses hopelessness, helplessness and guilt.  Skin: He is not diaphoretic.   HISTORY OF SUICIDAL ACTS AND SELF-HARM:  None.   HISTORY OF VIOLENCE/ASSAULTING OTHERS/LEGAL PROBLEMS:  No current legal issues.   SUBSTANCE USE HISTORY:  Caffeine: Coffee 2-3 cups per day. Caffeinated Beverages (green tea) 16 ounces per day.  Nicotine: Patient denies.  Alcohol: 3 drinks a week Drugs: Patient denies.   MENTAL ILLNESS AND SUBSTANCE ABUSE IN FAMILY MEMBERS: Reviewed  Psychiatric illness: Father-Bipolar Disorder. Mother-depression.  Substance abuse: Paternal-Alcoholism  Suicides: Patient denied   MILITARY HISTORY:  [employer]   Allergies: No Known Allergies   MEDICAL INFORMATION  Past Medical History  Diagnosis Date  . Irritability and anger     symptoms at home and work  . Anxiety   . Depression   . HIV infection    b) CURRENT PRESCRIBED MEDICATIONS:  Current Outpatient Prescriptions on File Prior to Visit  Medication Sig Dispense Refill  . ARIPiprazole (ABILIFY) 10 MG tablet Take 1 tablet (10 mg total) by mouth daily.  30 tablet  1  . aspirin EC 81 MG tablet Take 1 tablet (81 mg total) by mouth daily.  90 tablet  3  . atorvastatin (LIPITOR) 40 MG tablet Take 1 tablet (40 mg  total) by mouth daily.  90 tablet  3  . clonazePAM (KLONOPIN) 0.5 MG tablet Take 1 tablet (0.5 mg total) by mouth 3 (three) times daily as needed for anxiety.  90 tablet  0  . losartan  (COZAAR) 50 MG tablet Take 1 tablet (50 mg total) by mouth daily.  30 tablet  3  . naltrexone (DEPADE) 50 MG tablet TAKE 1 TABLET (50 MG TOTAL) BY MOUTH DAILY.  30 tablet  1  . [DISCONTINUED] clomiPRAMINE (ANAFRANIL) 25 MG capsule Take 1 capsule (25 mg total) by mouth at bedtime.  30 capsule  0   No current facility-administered medications on file prior to visit.    e) CURRENT OTC MEDICATIONS  1) Vitamins  2) Fish oil  3) Vit B   ALLERGIES/ ADVERSE REACTIONS:  1) None   Mental Status Examination/Evaluation:  Objective: Appearance: Fairly Groomed, Patient appears anxious during the interview.   Eye Contact: fair   Speech: Some decrease of difficulty with speech, stuttering  and forming sentencing with increased anxiety.   Volume: Normal   Mood: "Pretty Good." 6/10  Affect: Full range, congruent   Thought Process: Coherent and Goal Directed   Orientation: Full   Thought Content: Within normal limits with the exception of paranoia regarding people taking advantage of him.   Suicidal Thoughts: Patient denies suicidal ideation, intent or plans today.   Homicidal Thoughts: No   Memory: Immediate; Good  Recent:   Judgement: Good   Insight: Fair   Psychomotor Activity: Normal   Concentration:Fair   Memory: intact immediate 3/3; impaired recent 3/3   Akathisia: No   Handed: Right   AIMS (if indicated): not indicates   Assets:  Desire for Improvement  Financial Resources/Insurance  Housing  Social Support    Lab Results: No results found for this or any previous visit (from the past 48 hour(S)).   SUMMARY AND FORMULATION: Will initiate a trial return to work on 09/21/2012. Spoke with patient's employers, this will be a day that the patient's supervisor can  ASSESSMENT:  Axis I: Post Traumatic Stress disorder, Panic Disorder with Agoraphobia, Obsessive Compulsive Disorder, Rule out Bipolar II Disorder  Axis II: No diagnosis.  Axis III: No diagnosis  Axis IV: Job related  stressors, familial stressors.  Axis V: GAF: 49  PLAN:  1. Affirm with the patient that the medications are taken as ordered. Patient expressed understanding of how their medications were to be used.  2. Continue the following psychiatric medications as written prior to this appointment with the following changes:  A) Continue naltrexone 50 mg daily B) Decrease Clonazepam 0.5 mg TID, PRN anxiety, patient has already refilled clonazepam on 11/11/2011, #90. No refills today, will ascertain if he can tolerate further decrease prior to filling any further medications. C) Will continue Abilify to 10 mg daily. 3. Therapy: brief supportive therapy provided. Discussed psychosocial stressors. Continue individual outpatient therapy. Continue exposure to increasing level of stress and exposure. EMDR therapy will call myself. 4. Risks and benefits, side effects and alternatives discussed with patient, he was given an opportunity to ask questions about his medication, illness, and treatment. All current psychiatric medications have been reviewed and discussed with the patient and adjusted as clinically appropriate. The patient has been provided an accurate and updated list of the medications being now prescribed.  5. Patient told to call clinic if any problems occur. Patient advised to go to ER if he should develop SI/HI, side effects, or if symptoms worsen. Has crisis  numbers to call if needed.  6. As patient will be continued on an atypical antipsychotic at this time, will do labs if not done by PCP.  7. The patient was encouraged to keep PCP appointment, secondary to blood pressure and start medications as advised by PCP.  8. Patient was instructed to return to clinic in 4 weeks.  9. The patient expressed understanding of the plan outlined above and agrees with the plan.   Jacqulyn Cane, MD

## 2013-01-26 ENCOUNTER — Ambulatory Visit (INDEPENDENT_AMBULATORY_CARE_PROVIDER_SITE_OTHER): Payer: 59 | Admitting: Behavioral Health

## 2013-01-26 ENCOUNTER — Encounter (HOSPITAL_COMMUNITY): Payer: Self-pay | Admitting: Behavioral Health

## 2013-01-26 DIAGNOSIS — F411 Generalized anxiety disorder: Secondary | ICD-10-CM

## 2013-01-26 NOTE — Progress Notes (Signed)
   THERAPIST PROGRESS NOTE  Session Time: 8:00  Participation Level: Active  Behavioral Response: CasualAlertAnxious  Type of Therapy: Individual Therapy  Treatment Goals addressed: Coping  Interventions: CBT  Summary: Stephen Myers is a 52 y.o. male who presents with anxiety.   Suicidal/Homicidal: Nowithout intent/plan  Therapist Response: The client presented with some anxiety as he the session. I noticed his affect and his posture in his facial expression. He indicated that yesterday was a difficult day. He indicated that he had what he called as a near panic attack. He indicated that he got the shakes and was tearful and had difficulty going into a clients office. He stated that it was not full-blown panic attack. 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 As we unpacked what led up to this he became clear that the work to situations one this week and one last week in which he was not able to complete a call. In one situation the manager will was not in the store and any other situation to measure was time for someone else and could not meet with him. He indicates that not being able to close out what he was supposed to do make him feel like he failed. He had set as a goal seeing all of his clients and the first quarter which he says if things go well today he will be able to do. He also indicated that last week he saw a client here he is very close to for the first time since going back to work. He indicated that he became tearful while speaking to her and she became tearful and that made him feel weak. As we talked he recognize the fact that all of these issues probably led up to the anxiety issues yesterday. We reinforce coping skills but also talked about compartmentalizing each call as opposed to looking down the road. He did say that his dad telling with his senior manager went well  and that his senior manager whose name is [name] was complimentary of the job that he is doing. He indicated that was a source of comfort. The client is concerned that his medication is not stopping the anxiety. I told him that it may not stop completely and that things that he is doing in therapy outside of therapy will help reduce that. I told him that he had concerns about the medication to address them with Dr. Demetrius Charity. which he said he would do. He also admitted there some anxiety when his wife is out of town for work and she is out of town all of this week. The client does contract for safety saying he has no thoughts of hurting himself or anyone else.  Plan: Return again in 3 weeks.  Diagnosis: Axis I: 300.02    Axis II: Deferred    [name], [facility] 01/26/2013

## 2013-02-08 ENCOUNTER — Other Ambulatory Visit: Payer: Self-pay | Admitting: Sports Medicine

## 2013-02-09 ENCOUNTER — Encounter (HOSPITAL_COMMUNITY): Payer: Self-pay | Admitting: Psychiatry

## 2013-02-09 ENCOUNTER — Ambulatory Visit (INDEPENDENT_AMBULATORY_CARE_PROVIDER_SITE_OTHER): Payer: 59 | Admitting: Psychiatry

## 2013-02-09 VITALS — BP 160/89 | HR 68 | Ht 76.0 in | Wt 276.0 lb

## 2013-02-09 DIAGNOSIS — F4001 Agoraphobia with panic disorder: Secondary | ICD-10-CM

## 2013-02-09 DIAGNOSIS — F3342 Major depressive disorder, recurrent, in full remission: Secondary | ICD-10-CM

## 2013-02-09 DIAGNOSIS — F429 Obsessive-compulsive disorder, unspecified: Secondary | ICD-10-CM

## 2013-02-09 DIAGNOSIS — F4310 Post-traumatic stress disorder, unspecified: Secondary | ICD-10-CM

## 2013-02-09 MED ORDER — LITHIUM CARBONATE 150 MG PO CAPS: ORAL_CAPSULE | ORAL | Status: DC

## 2013-02-09 MED ORDER — CLONAZEPAM 0.5 MG PO TABS: 0.5000 mg | ORAL_TABLET | Freq: Three times a day (TID) | ORAL | Status: DC | PRN

## 2013-02-09 NOTE — Progress Notes (Signed)
[name] 1961-05-12  Date: 01/18/2013  HISTORY OF CURRENT ILLNESS:   Stephen Myers is a 52 Y/O male with a past psychiatric history significant for Major Depression, recurrent, moderate. Obsessive Compulsive Disorder, Anxiety Disorder, NOS. The patient is referred for psychiatric services for medication management.  The patient reports that he feels like he is going backwards. 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 He states other than not exercising he is not doing anything different.  He reports he is having difficulty with speaking, and initiating sentences.  He reports increased stress as he is trying to keep up with the changes at work. 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  Adding to his concerns are multiple changes to his work procedures since he was on leave. He reports he is taking his medications and denies any side effects.   The patient denies current suicidal ideation, intent, or plans.  However, he has been having chronic thoughts of death, and because of his anxietyient denies any current homicidal ideation, plans or intent. Patient denies auditory hallucinations. Patient denies visual hallucinations. The patient denies any paranoia. Patient states sleep is poor with 0-6 hours per night.  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  He states that his obsessions keep him up throughout the night. Energy level is fair. Patient reports some continued improvement in symptoms of anhedonia.   Denies any recent  episodes consistent with mania, particularly decreased need for sleep with increased energy, grandiosity, impulsivity, hyperverbal and pressured speech, or increased productivity. Denies any recent symptoms consistent with psychosis, particularly auditory or visual hallucinations, thought broadcasting/insertion/withdrawal, or ideas of reference.   The patient reports he is taking his medications and denies any side effects.  He reports anxiety about restarting work but feels the need to attempt to try to restart his job.  He continues to ruminate about failing at his job.  He reports some increasing anxiety but has been exposing himself to greater levels of stress.   Review of Systems  Constitutional: Negative.  Respiratory: Negative.  Cardiovascular: Negative.  Gastrointestinal: Negative.  Neurological: Negative.   Filed Vitals:   02/09/13 0925  BP: 160/89  Pulse: 68  Height: 6\' 4"  (1.93 m)  Weight: 276 lb (125.193 kg)    Physical Exam  Constitutional: He appears well-developed and well-nourished. No acute physical distress. Patient endorses hopelessness, helplessness and guilt.  Skin: He is not diaphoretic.   HISTORY OF SUICIDAL ACTS AND SELF-HARM:  None.   HISTORY OF VIOLENCE/ASSAULTING OTHERS/LEGAL PROBLEMS:  No current legal issues.   SUBSTANCE USE HISTORY:  Caffeine: Coffee 2-3 cups per day. Caffeinated Beverages (green tea) 16 ounces per day.  Nicotine: Patient denies.  Alcohol: 3 drinks a week Drugs: Patient denies.   MENTAL ILLNESS AND SUBSTANCE ABUSE IN FAMILY MEMBERS: Reviewed  Psychiatric illness: Father-Bipolar Disorder. Mother-depression.  Substance abuse: Paternal-Alcoholism  Suicides: Patient denied   MILITARY HISTORY:  [employer]   Allergies: No Known Allergies   MEDICAL INFORMATION  Past  Medical History  Diagnosis Date  . Irritability and anger     symptoms at home and work  . Anxiety   . Depression   . HIV infection    b) CURRENT PRESCRIBED  MEDICATIONS:  Current Outpatient Prescriptions on File Prior to Visit  Medication Sig Dispense Refill  . ARIPiprazole (ABILIFY) 10 MG tablet Take 1 tablet (10 mg total) by mouth daily.  30 tablet  1  . aspirin EC 81 MG tablet Take 1 tablet (81 mg total) by mouth daily.  90 tablet  3  . atorvastatin (LIPITOR) 40 MG tablet Take 1 tablet (40 mg total) by mouth daily.  90 tablet  3  . clonazePAM (KLONOPIN) 0.5 MG tablet Take 1 tablet (0.5 mg total) by mouth 3 (three) times daily as needed for anxiety.  90 tablet  0  . losartan (COZAAR) 50 MG tablet TAKE 1 TABLET (50 MG TOTAL) BY MOUTH DAILY.  30 tablet  3  . naltrexone (DEPADE) 50 MG tablet Take 1 tablet (50 mg total) by mouth daily.  30 tablet  1  . [DISCONTINUED] clomiPRAMINE (ANAFRANIL) 25 MG capsule Take 1 capsule (25 mg total) by mouth at bedtime.  30 capsule  0   No current facility-administered medications on file prior to visit.    e) CURRENT OTC MEDICATIONS  1) Vitamins  2) Fish oil  3) Vit B   ALLERGIES/ ADVERSE REACTIONS:  1) None   Mental Status Examination/Evaluation:  Objective: Appearance: Fairly Groomed, Patient appears anxious during the interview.   Eye Contact: fair   Speech: Some decrease of difficulty with speech, stuttering  and forming sentencing with increased anxiety.   Volume: Normal   Mood: "Pretty Good." 6/10  Affect: Full range, congruent   Thought Process: Coherent and Goal Directed   Orientation: Full   Thought Content: Within normal limits with the exception of paranoia regarding people taking advantage of him.   Suicidal Thoughts: Patient denies suicidal ideation, intent or plans today.   Homicidal Thoughts: No   Memory: Immediate; Good  Recent:   Judgement: Good   Insight: Fair   Psychomotor Activity: Normal   Concentration:Fair   Memory: intact immediate 3/3; impaired recent 3/3   Akathisia: No   Handed: Right   AIMS (if indicated): not indicates   Assets:  Desire for Improvement  Financial  Resources/Insurance  Housing  Social Support    Lab Results: No results found for this or any previous visit (from the past 48 hour(S)).   SUMMARY AND FORMULATION: Will initiate a trial return to work on 09/21/2012. Spoke with patient's employers, this will be a day that the patient's supervisor can  ASSESSMENT:  Axis I: Post Traumatic Stress disorder, Panic Disorder with Agoraphobia, Obsessive Compulsive Disorder, Rule out Bipolar II Disorder  Axis II: No diagnosis.  Axis III: No diagnosis  Axis IV: Job related stressors, familial stressors.  Axis V: GAF: 47  PLAN:  1. Affirm with the patient that the medications are taken as ordered. Patient expressed understanding of how their medications were to be used.  2. Continue the following psychiatric medications as written prior to this appointment with the following changes:  A) Continue naltrexone 50 mg daily B) Continue Clonazepam 0.5 mg TID, PRN anxiety. C) Will continue Abilify to 10 mg daily. D) Have spoken to patient's PCP regarding propranolol for anxiety. Will start at the 10 mg daily. 3. Therapy: brief supportive therapy provided. Discussed psychosocial stressors. Continue individual outpatient therapy. Continue exposure to increasing  level of stress and exposure. EMDR therapy will call myself. 4. Risks and benefits, side effects and alternatives discussed with patient, he was given an opportunity to ask questions about his medication, illness, and treatment. All current psychiatric medications have been reviewed and discussed with the patient and adjusted as clinically appropriate. The patient has been provided an accurate and updated list of the medications being now prescribed.  5. Patient told to call clinic if any problems occur. Patient advised to go to ER if he should develop SI/HI, side effects, or if symptoms worsen. Has crisis numbers to call if needed.  6. As patient will be continued on an atypical antipsychotic at this  time, will do labs if not done by PCP.  7. The patient was encouraged to keep PCP appointment, secondary to blood pressure and start medications as advised by PCP.  8. Patient was instructed to return to clinic in 4 weeks.  9. The patient expressed understanding of the plan outlined above and agrees with the plan.   Jacqulyn Cane, M.D.  02/09/2013 9:34 AM

## 2013-02-10 ENCOUNTER — Telehealth (HOSPITAL_COMMUNITY): Payer: Self-pay | Admitting: Psychiatry

## 2013-02-10 ENCOUNTER — Other Ambulatory Visit (HOSPITAL_COMMUNITY): Payer: Self-pay | Admitting: Psychiatry

## 2013-02-10 DIAGNOSIS — F4001 Agoraphobia with panic disorder: Secondary | ICD-10-CM

## 2013-02-10 MED ORDER — PROPRANOLOL HCL 10 MG PO TABS: 10.0000 mg | ORAL_TABLET | ORAL | Status: DC | PRN

## 2013-02-10 NOTE — Telephone Encounter (Signed)
Will have trial of propanolol 10 mg daily for anxiety. Will start decreasing clonazepam. Aware of potential interaction between propranolol and Lithium, but at this time, benefits outweighs risk, Lithium will mostlikely be used temporarily.  PLAN: Will start propanolol 10 mg.

## 2013-02-14 ENCOUNTER — Ambulatory Visit (HOSPITAL_COMMUNITY): Payer: Self-pay | Admitting: Behavioral Health

## 2013-02-15 ENCOUNTER — Other Ambulatory Visit (HOSPITAL_COMMUNITY): Payer: Self-pay | Admitting: Psychiatry

## 2013-02-18 ENCOUNTER — Telehealth (HOSPITAL_COMMUNITY): Payer: Self-pay | Admitting: Psychiatry

## 2013-02-18 DIAGNOSIS — F4310 Post-traumatic stress disorder, unspecified: Secondary | ICD-10-CM

## 2013-02-18 MED ORDER — NALTREXONE HCL 50 MG PO TABS: 50.0000 mg | ORAL_TABLET | Freq: Every day | ORAL | Status: DC

## 2013-02-18 NOTE — Telephone Encounter (Signed)
Refill request for naltrexone. Will fill the same.

## 2013-02-21 ENCOUNTER — Encounter (HOSPITAL_COMMUNITY): Payer: Self-pay | Admitting: *Deleted

## 2013-02-21 ENCOUNTER — Ambulatory Visit (INDEPENDENT_AMBULATORY_CARE_PROVIDER_SITE_OTHER): Payer: 59 | Admitting: Psychiatry

## 2013-02-21 ENCOUNTER — Encounter (HOSPITAL_COMMUNITY): Payer: Self-pay | Admitting: Psychiatry

## 2013-02-21 VITALS — BP 122/76 | HR 69 | Ht 76.0 in | Wt 276.0 lb

## 2013-02-21 DIAGNOSIS — F429 Obsessive-compulsive disorder, unspecified: Secondary | ICD-10-CM

## 2013-02-21 DIAGNOSIS — F3342 Major depressive disorder, recurrent, in full remission: Secondary | ICD-10-CM

## 2013-02-21 DIAGNOSIS — F4310 Post-traumatic stress disorder, unspecified: Secondary | ICD-10-CM

## 2013-02-21 DIAGNOSIS — F4001 Agoraphobia with panic disorder: Secondary | ICD-10-CM

## 2013-02-21 MED ORDER — PROPRANOLOL HCL 10 MG PO TABS: 20.0000 mg | ORAL_TABLET | ORAL | Status: DC | PRN

## 2013-02-21 NOTE — Progress Notes (Signed)
[facility] Behavioral Health Follow-up Outpatient Visit  Stephen Myers 1961-06-19  Date: 02/21/2013  HISTORY OF CURRENT ILLNESS:   [name] is a 52 Y/O male with a past psychiatric history significant for Major Depression, recurrent, moderate. Obsessive Compulsive Disorder, Anxiety Disorder, NOS. The patient is referred for psychiatric services for medication management.  The patient reports that he feels he continues to have stress about whether he can do his job. Though he has not heard any negative comments about his work he is worried as he has only been able to complete 90% of his work.  We talked about more frequently evaluations with his immediate supervisor to get an objective picture of his work performance which would allow him to stop obsessing. He reports that the propranolol has not caused any side effects, but has not worked as well. He asked about increasing his dose. He continues to come in for therapy. He reports he is taking his medications and denies any side effects.   The patient denies current suicidal ideation, intent, or plans.  However, he continues to have some thoughts of death,when he considers whether he can continue to do his job. He  denies any current homicidal ideation, plans or intent. Patient denies auditory hallucinations. Patient denies visual hallucinations. The patient denies any paranoia. Patient states sleep is poor with 9 hours per night.  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  He states that his obsessions keep him up throughout the night. Energy level is fair. Patient reports some continued improvement in symptoms of anhedonia.   Denies any recent episodes consistent with mania, particularly decreased need for sleep with increased energy, grandiosity, impulsivity, hyperverbal and pressured speech, or increased productivity. Denies any recent symptoms consistent with psychosis,  particularly auditory or visual hallucinations, thought broadcasting/insertion/withdrawal, or ideas of reference.    Review of Systems  Constitutional: Negative for fever, chills and weight loss.  Respiratory: Positive for shortness of breath (With panic attacks.). Negative for cough, sputum production and wheezing.   Cardiovascular: Negative for chest pain, palpitations and leg swelling.  Gastrointestinal: Positive for nausea. Negative for vomiting, abdominal pain, diarrhea and constipation.  Musculoskeletal:       No gait abnormalities or muscle weakness.    Filed Vitals:   02/21/13 1548  BP: 122/76  Pulse: 69  Height: 6\' 4"  (1.93 m)  Weight: 276 lb (125.193 kg)   Physical Exam  Constitutional: He appears well-developed and well-nourished. No acute physical distress. Patient endorses hopelessness, helplessness and guilt.  Skin: He is not diaphoretic.   HISTORY OF SUICIDAL ACTS AND SELF-HARM:  None.   HISTORY OF VIOLENCE/ASSAULTING OTHERS/LEGAL PROBLEMS:  No current legal issues.   SUBSTANCE USE HISTORY:  Caffeine: Coffee 2 cups per day. Caffeinated Beverages (green tea) 16 ounces per day.  Nicotine: Patient denies.  Alcohol: 1 drinks a week Drugs: Patient denies.   MENTAL ILLNESS AND SUBSTANCE ABUSE IN FAMILY MEMBERS: Reviewed  Psychiatric illness: Father-Bipolar Disorder. Mother-depression.  Substance abuse: Paternal-Alcoholism  Suicides: Patient denied   MILITARY HISTORY:  [employer]   Allergies: No Known Allergies   MEDICAL INFORMATION  Past Medical History  Diagnosis Date  . Irritability and anger     symptoms at home and work  . Anxiety   . Depression   . HIV infection    b) CURRENT PRESCRIBED MEDICATIONS:  Current Outpatient Prescriptions on File Prior to Visit  Medication  Sig Dispense Refill  . ARIPiprazole (ABILIFY) 10 MG tablet Take 1 tablet (10 mg total) by mouth daily.  30 tablet  1  . aspirin EC 81 MG tablet Take 1 tablet (81 mg total) by mouth  daily.  90 tablet  3  . atorvastatin (LIPITOR) 40 MG tablet Take 1 tablet (40 mg total) by mouth daily.  90 tablet  3  . clonazePAM (KLONOPIN) 0.5 MG tablet Take 1 tablet (0.5 mg total) by mouth 3 (three) times daily as needed for anxiety.  90 tablet  1  . lithium carbonate 150 MG capsule Take 1 caps for 7 days, then 2 caps for 7 days, then 3 caps for 7 days, then 4 caps daily for 7 days.  70 capsule  1  . losartan (COZAAR) 50 MG tablet TAKE 1 TABLET (50 MG TOTAL) BY MOUTH DAILY.  30 tablet  3  . naltrexone (DEPADE) 50 MG tablet Take 1 tablet (50 mg total) by mouth daily.  30 tablet  1  . propranolol (INDERAL) 10 MG tablet Take 1 tablet (10 mg total) by mouth as needed.  30 tablet  0  . [DISCONTINUED] clomiPRAMINE (ANAFRANIL) 25 MG capsule Take 1 capsule (25 mg total) by mouth at bedtime.  30 capsule  0   No current facility-administered medications on file prior to visit.    e) CURRENT OTC MEDICATIONS  1) Vitamins  2) Fish oil  3) Vit B   ALLERGIES/ ADVERSE REACTIONS:  1) None   Mental Status Examination/Evaluation:  Objective: Appearance: Fairly Groomed, Patient appears anxious during the interview.   Eye Contact: fair   Speech: Some decrease of difficulty with speech, stuttering  and forming sentencing with increased anxiety.   Volume: Normal   Mood: "not too good."  6/10  Affect: Full range, congruent   Thought Process: Coherent and Goal Directed   Orientation: Full   Thought Content: Within normal limits with the exception of paranoia regarding people taking advantage of him.   Suicidal Thoughts: Patient denies suicidal ideation, intent or plans today.   Homicidal Thoughts: No   Memory: Immediate; Good  Recent:   Judgement: Good   Insight: Fair   Psychomotor Activity: Normal   Concentration:Fair   Memory: intact immediate 3/3; impaired recent 3/3   Akathisia: No   Handed: Right   AIMS (if indicated): not indicates   Assets:  Desire for Improvement  Financial  Resources/Insurance  Housing  Social Support    Lab Results: No results found for this or any previous visit (from the past 48 hour(S)).   SUMMARY AND FORMULATION:   ASSESSMENT:  Axis I: Post Traumatic Stress disorder, Panic Disorder with Agoraphobia, Obsessive Compulsive Disorder, Rule out Bipolar II Disorder  Axis II: No diagnosis.  Axis III: No diagnosis  Axis IV: Job related stressors, familial stressors.  Axis V: GAF: 48  PLAN:  1. Affirm with the patient that the medications are taken as ordered. Patient expressed understanding of how their medications were to be used.  2. Continue the following psychiatric medications as written prior to this appointment with the following changes:  A) Continue naltrexone 50 mg daily B) Continue Clonazepam 0.5 mg TID, PRN anxiety. Asked patient to take propranolol instead of one of his doses of clonazepam.  C) Will continue Abilify to 10 mg daily. D) Increase propranolol for anxiety to 20 mg daily. 3. Therapy: brief supportive therapy provided. Discussed psychosocial stressors. Continue individual outpatient therapy. Continue exposure to increasing level of stress and  exposure. EMDR therapy will call myself. 4. Risks and benefits, side effects and alternatives discussed with patient, he was given an opportunity to ask questions about his medication, illness, and treatment. All current psychiatric medications have been reviewed and discussed with the patient and adjusted as clinically appropriate. The patient has been provided an accurate and updated list of the medications being now prescribed.  5. Patient told to call clinic if any problems occur. Patient advised to go to ER if he should develop SI/HI, side effects, or if symptoms worsen. Has crisis numbers to call if needed.  6. As patient will be continued on an atypical antipsychotic at this time, will do labs if not done by PCP.  7. The patient was encouraged to keep PCP appointment, secondary  to blood pressure and start medications as advised by PCP.  8. Patient was instructed to return to clinic in 4 weeks.  9. The patient expressed understanding of the plan outlined above and agrees with the plan.   Jacqulyn Cane, M.D.  02/21/2013 3:47 PM

## 2013-02-24 ENCOUNTER — Ambulatory Visit (INDEPENDENT_AMBULATORY_CARE_PROVIDER_SITE_OTHER): Payer: 59 | Admitting: Behavioral Health

## 2013-02-24 ENCOUNTER — Telehealth (HOSPITAL_COMMUNITY): Payer: Self-pay | Admitting: Psychiatry

## 2013-02-24 ENCOUNTER — Encounter (HOSPITAL_COMMUNITY): Payer: Self-pay | Admitting: Behavioral Health

## 2013-02-24 DIAGNOSIS — F339 Major depressive disorder, recurrent, unspecified: Secondary | ICD-10-CM

## 2013-02-24 DIAGNOSIS — F411 Generalized anxiety disorder: Secondary | ICD-10-CM

## 2013-02-24 NOTE — Progress Notes (Addendum)
   THERAPIST PROGRESS NOTE  Session Time: 8:00  Participation Level: Active  Behavioral Response: Well GroomedAlertAnxious/depressed  Type of Therapy: Individual Therapy  Treatment Goals addressed: Coping  Interventions: CBT  Summary: Stephen Myers is a 52 y.o. male who presents with anxiety and depression.   Suicidal/Homicidal: Stephen Myers intent/plan  Therapist Response: The client entered the session was tearful immediately and his voice is somewhat shaky. He indicated that he took last week off which he typically does for Easter but had struggled in returning to work this week. He indicated that he is anxious from the time he starts thinking about going to work until the end of the day saying that he never feels comfortable before during or after seeing clients. He indicates that he is concerned about his productivity. He did say he asked his boss to look at his numbers to make sure he was on track and knows it is hard on himself. The client indicated that he has been having increasing suicidal thoughts and they have increased even more so this week as he started back to work. He indicated that he had a plan of either closing himself in his car in his garage or taking an overdose of his medication. He did indicate that the reason he has not done so far is because of his family and what it would do to them how much he loves him. He could not " with 100% certainty" say that he would not commit suicide. He did say he did not want to go to the hospital was trying to avoid doing that. As I had a clock session I involved Dr. Demetrius Charity. telling him what is going on with the client. We both agreed after meeting with the client that he needed to be a safety plan for the client. His wife was out of town for the day at training in [name] so he did call [name] who came and picked him up from the office. We later called [name] at lunch and he said that he was out on the market would stay  with one of the 2 brothers at any point time that he needed to do so it will make sure that he was with [name] from that point forward. He was also in to take today and tomorrow off from work and Dr. Demetrius Charity. was going to speak with him about changing his medications or increasing them. I attempted to call [name] and left her voicemail on her cell phone and as of the writing of this note at 3:06 PM on April 24 I have not heard back from her yet. Decline as well as his brothers were told that if he continued to be suicidal he needed to be taken to the emergency room for assessment which they agreed to do. I told the client would like to schedule him weekly if possible but we needed to consider the possibility of intensive outpatient therapy and hospitalization for medication adjustment if needed.  Plan: Return again in 1 weeks.  Diagnosis: Axis I: 296.30/300.02    Axis II: Deferred    Stephen Myers, [facility] 02/24/2013

## 2013-02-24 NOTE — Telephone Encounter (Addendum)
Spoke to patient who was in session with his therapist.  He reports a worsening of depression in the past week. He states there was some improvement with the initiation of lithium, including an improvement with sleep, but three days ago he had a two night in a row where he had difficult with ruminating thoughts.

## 2013-02-25 ENCOUNTER — Telehealth (HOSPITAL_COMMUNITY): Payer: Self-pay | Admitting: Psychiatry

## 2013-02-25 NOTE — Telephone Encounter (Signed)
The patient reports that he has increased Lithium to 450 mg and will further increase to 600 mg.  Will have patient out of work until his next appointment on Tuesday.  Patient has reported that clonazepam was taken, possibly by his daughter. Will be prescribing only weekly prescriptions of clonazepam at this time.

## 2013-02-25 NOTE — Telephone Encounter (Signed)
Called patient's International aid/development worker. He reports some minor mistakes, secondary to form changes, and process changes but overall his work quality is good.

## 2013-03-01 ENCOUNTER — Encounter (HOSPITAL_COMMUNITY): Payer: Self-pay | Admitting: Psychiatry

## 2013-03-01 ENCOUNTER — Ambulatory Visit (INDEPENDENT_AMBULATORY_CARE_PROVIDER_SITE_OTHER): Payer: 59 | Admitting: Psychiatry

## 2013-03-01 VITALS — BP 127/78 | HR 93 | Ht 76.0 in | Wt 278.0 lb

## 2013-03-01 DIAGNOSIS — F3342 Major depressive disorder, recurrent, in full remission: Secondary | ICD-10-CM

## 2013-03-01 DIAGNOSIS — F4310 Post-traumatic stress disorder, unspecified: Secondary | ICD-10-CM

## 2013-03-01 DIAGNOSIS — F429 Obsessive-compulsive disorder, unspecified: Secondary | ICD-10-CM

## 2013-03-01 DIAGNOSIS — F4001 Agoraphobia with panic disorder: Secondary | ICD-10-CM

## 2013-03-01 MED ORDER — CLONAZEPAM 0.5 MG PO TABS: 0.5000 mg | ORAL_TABLET | Freq: Three times a day (TID) | ORAL | Status: DC | PRN

## 2013-03-01 MED ORDER — LITHIUM CARBONATE 300 MG PO CAPS: 600.0000 mg | ORAL_CAPSULE | Freq: Every day | ORAL | Status: DC

## 2013-03-01 NOTE — Progress Notes (Signed)
[facility] Behavioral Health Follow-up Outpatient Visit  Mortimer Bair 01/01/1961  Date: 03/01/2013  HISTORY OF CURRENT ILLNESS:   Mr. Blumenfeld is a 52 Y/O male with a past psychiatric history significant for Major Depression, recurrent, moderate. Obsessive Compulsive Disorder, Anxiety Disorder, NOS. The patient is referred for psychiatric services for medication management.  The patient reports he had a reduction of suicidal thoughts to the point of only "thinking about death" at least three times a day when he thinks about not being able to work. He states he wants to leave his job on his own terms and not because he is too sick to do his job.  His daughter's chemical dependency continues to be a problems leading up to a court case, with mandatory drug treatment.  He reports his daughters mental health and concerns about her safety continue to be a stressor for the patient.   The patient denies current suicidal ideation, intent, or plans. He  denies any current homicidal ideation, plans or intent. Patient denies auditory hallucinations. Patient denies visual hallucinations. The patient endorses paranoia about his job safety and what his colleagues and supervisors are thinking about him.. Patient states sleep is increased with 9 hours per night, but he feels like he is drowsy in the morning.  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  Energy level is low.  He reports feeling like he has to drag himself out of bed every day.  Denies any recent episodes consistent with mania, particularly decreased need for sleep with increased energy, grandiosity, impulsivity, hyperverbal and pressured speech, or increased productivity. Denies any recent symptoms consistent with psychosis, particularly auditory or visual hallucinations, thought broadcasting/insertion/withdrawal, or ideas of reference.    Review of Systems   Constitutional: Negative for fever, chills and weight loss.  Respiratory: Positive for shortness of breath (With panic attacks.). Negative for cough, sputum production and wheezing.   Cardiovascular: Negative for chest pain, palpitations and leg swelling.  Gastrointestinal: Positive for nausea. Negative for vomiting, abdominal pain, diarrhea and constipation.  Musculoskeletal:       No gait abnormalities or muscle weakness.   Filed Vitals:   03/01/13 1009  BP: 127/78  Pulse: 93  Height: 6\' 4"  (1.93 m)  Weight: 278 lb (126.1 kg)   Physical Exam  Constitutional: He appears well-developed and well-nourished. No acute physical distress. Patient endorses hopelessness, helplessness and guilt.  Skin: He is not diaphoretic.   HISTORY OF SUICIDAL ACTS AND SELF-HARM:  None.   HISTORY OF VIOLENCE/ASSAULTING OTHERS/LEGAL PROBLEMS:  No current legal issues.   SUBSTANCE USE HISTORY:  Caffeine: Coffee 2 cups per day. Caffeinated Beverages (green tea) 16 ounces per day.  Nicotine: Patient denies.  Alcohol: 1 drinks a week Drugs: Patient denies.   MENTAL ILLNESS AND SUBSTANCE ABUSE IN FAMILY MEMBERS: Reviewed  Psychiatric illness: Father-Bipolar Disorder. Mother-depression.  Substance abuse: Paternal-Alcoholism  Suicides: Patient denied   MILITARY HISTORY:  [employer]   Allergies: No Known Allergies   MEDICAL INFORMATION  Past Medical History  Diagnosis Date  . Irritability and anger     symptoms at home and work  . Anxiety   . Depression   . HIV infection    b) CURRENT PRESCRIBED MEDICATIONS:  Current Outpatient Prescriptions on File Prior to Visit  Medication Sig Dispense Refill  . ARIPiprazole (ABILIFY) 10 MG tablet Take 1 tablet (10 mg total) by mouth daily.  30 tablet  1  . aspirin EC 81 MG tablet Take 1 tablet (81 mg total) by mouth daily.  90 tablet  3  . atorvastatin (LIPITOR) 40 MG tablet Take 1 tablet (40 mg total) by mouth daily.  90 tablet  3  . clonazePAM  (KLONOPIN) 0.5 MG tablet Take 1 tablet (0.5 mg total) by mouth 3 (three) times daily as needed for anxiety.  90 tablet  1  . lithium carbonate 150 MG capsule Take 1 caps for 7 days, then 2 caps for 7 days, then 3 caps for 7 days, then 4 caps daily for 7 days.  70 capsule  1  . losartan (COZAAR) 50 MG tablet TAKE 1 TABLET (50 MG TOTAL) BY MOUTH DAILY.  30 tablet  3  . naltrexone (DEPADE) 50 MG tablet Take 1 tablet (50 mg total) by mouth daily.  30 tablet  1  . propranolol (INDERAL) 10 MG tablet Take 2 tablets (20 mg total) by mouth as needed.  60 tablet  1  . [DISCONTINUED] clomiPRAMINE (ANAFRANIL) 25 MG capsule Take 1 capsule (25 mg total) by mouth at bedtime.  30 capsule  0   No current facility-administered medications on file prior to visit.    e) CURRENT OTC MEDICATIONS  1) Vitamins  2) Fish oil  3) Vit B   ALLERGIES/ ADVERSE REACTIONS:  1) None   Mental Status Examination/Evaluation:  Objective: Appearance: Fairly Groomed, Patient appears anxious during the interview.   Eye Contact: fair   Speech: Some decrease of difficulty with speech, stuttering  and forming sentencing with increased anxiety.   Volume: Normal   Mood: "worried."  2/10  (0=Very depressed; 5=Neutral; 10=Very Happy)   Affect: Full range, congruent   Thought Process: Coherent and Goal Directed   Orientation: Full   Thought Content: Within normal limits with the exception of paranoia regarding people taking advantage of him.   Suicidal Thoughts: Patient denies suicidal ideation, intent or plans today.   Homicidal Thoughts: No   Memory: Immediate; Good  Recent:   Judgement: Good   Insight: Fair   Psychomotor Activity: Normal   Concentration:Fair   Memory: intact immediate 3/3; impaired recent 3/3   Akathisia: No   Handed: Right   AIMS (if indicated): not indicates   Assets:  Desire for Improvement  Financial Resources/Insurance  Housing  Social Support    Lab Results: No results found for this or any  previous visit (from the past 48 hour(S)).   SUMMARY AND FORMULATION:   ASSESSMENT:  Axis I: Post Traumatic Stress disorder, Panic Disorder with Agoraphobia, Obsessive Compulsive Disorder, Rule out Bipolar II Disorder  Axis II: No diagnosis.  Axis III: No diagnosis  Axis IV: Job related stressors, familial stressors.  Axis V: GAF: 48  PLAN:  1. Affirm with the patient that the medications are taken as ordered. Patient expressed understanding of how their medications were to be used.  2. Continue the following psychiatric medications as written prior to this appointment with the following changes:  A) Continue naltrexone 50 mg daily B) The patient could not tolerate a switch to propranolol.  He reports his daughter may have taken his clonazepam-30 tablets were missing.  The patient's daughter is now on court order drug rehabilitation treatment.  As the patient's anxiety is severe and lead to suicidal thoughts-the benefits of continued treatment outweighed the risk, however, as his daughter still lives with them will only prescribe this medication weekly with refills, and may even use a  shorter duration (ie. 3-5 days if needed). C) Will continue Abilify to 10 mg daily. D) Increase Lithium 600 mg will consider further increase as needed. E) Given his current severity of symptoms recommend short term disability with the possibility of ling term disability. 3. Therapy: brief supportive therapy provided. Discussed psychosocial stressors. Continue individual outpatient therapy. Continue exposure to increasing level of stress and exposure. EMDR therapy will call myself. 4. Risks and benefits, side effects and alternatives discussed with patient, he was given an opportunity to ask questions about his medication, illness, and treatment. All current psychiatric medications have been reviewed and discussed with the patient and adjusted as clinically appropriate. The patient has been provided an accurate and  updated list of the medications being now prescribed.  5. Patient told to call clinic if any problems occur. Patient advised to go to ER if he should develop SI/HI, side effects, or if symptoms worsen. Has crisis numbers to call if needed.  6. Will order Lithium Level, BUN, Creatinine, TSH prior to next appointment. 7. The patient was encouraged to keep PCP appointment, secondary to blood pressure and start medications as advised by PCP.  8. Patient was instructed to return to clinic in 2 weeks.  9. The patient expressed understanding of the plan outlined above and agrees with the plan.   Jacqulyn Cane, M.D.  03/01/2013 10:07 AM

## 2013-03-02 ENCOUNTER — Telehealth (HOSPITAL_COMMUNITY): Payer: Self-pay

## 2013-03-02 NOTE — Telephone Encounter (Signed)
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

## 2013-03-03 NOTE — Telephone Encounter (Signed)
Called patient. He denies any suicidal ideation, intent, or plans. Will send a letter.

## 2013-03-04 ENCOUNTER — Encounter (HOSPITAL_COMMUNITY): Payer: Self-pay | Admitting: Psychiatry

## 2013-03-04 NOTE — Telephone Encounter (Signed)
Called patient. The patient reports he slept well last night. He is doing well.

## 2013-03-07 ENCOUNTER — Ambulatory Visit (HOSPITAL_COMMUNITY): Payer: Self-pay | Admitting: Psychiatry

## 2013-03-10 ENCOUNTER — Ambulatory Visit (INDEPENDENT_AMBULATORY_CARE_PROVIDER_SITE_OTHER): Payer: 59 | Admitting: Behavioral Health

## 2013-03-10 ENCOUNTER — Encounter (HOSPITAL_COMMUNITY): Payer: Self-pay | Admitting: Behavioral Health

## 2013-03-10 DIAGNOSIS — F331 Major depressive disorder, recurrent, moderate: Secondary | ICD-10-CM

## 2013-03-10 DIAGNOSIS — F411 Generalized anxiety disorder: Secondary | ICD-10-CM

## 2013-03-10 LAB — CREATININE, SERUM: Creat: 1.03 mg/dL (ref 0.50–1.35)

## 2013-03-10 LAB — BUN: BUN: 17 mg/dL (ref 6–23)

## 2013-03-10 NOTE — Progress Notes (Signed)
   THERAPIST PROGRESS NOTE  Session Time: 8:00  Participation Level: Active  Behavioral Response: CasualAlertAnxious  Type of Therapy: Individual Therapy  Treatment Goals addressed: Coping  Interventions: CBT  Summary: Stephen Myers is a 52 y.o. male who presents with anxiety and depression.   Suicidal/Homicidal: Nowithout intent/plan  Therapist Response: The client stated that he is in a better place than when I saw him in the last session. She reports that he has had some suicidal thoughts but has no plan in place. He said that he felt the medication and helped him feel as if things" don't matter." He went on to clarify that saying that it's not necessarily negative it's more of a" what ever" attitude.Marland Kitchen He does still present with significant anxiety in terms of work and also stated that he became very anxious coming to this session and to the previous appointment with Dr. Demetrius Charity. We talked about the cognitive association of having panic attacks anxiety related to work and also the cognitive tearing of coming here since he was suicidal in the previous session with me. He recognizes that what he is going through is not about ability as his boss has given him great reviews but knows that he isn't more of a crisis of the confidence. He stated that Dr. Demetrius Charity. telling him that he either would have to find a way to work or go on long-term disability as 2 distinct possibilities made him think. He still wants to return to work but is beginning to accept the notion that if he cannot work full-time that he can apply for long-term disability and still work part time. 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 He is out of work for now and does not feel he can return. We reviewed coping skills and set him up to me again in 2 weeks. The client does contract for safety saying he has no thoughts of hurting himself. I did ask about the EMDR therapy . He stated that he felt that was helpful in processing his  relationship with his father but he feels most of was going on currently is related to his daughter. He stated that he is beginning to let go of her knowing that she is an adult and has to make her own choices even if they are not the best choices.  Plan: Return again in 2 weeks.  Diagnosis: Axis I: 300.02/296.32    Axis II: Deferred    [name], [facility] 03/10/2013

## 2013-03-15 ENCOUNTER — Ambulatory Visit (INDEPENDENT_AMBULATORY_CARE_PROVIDER_SITE_OTHER): Payer: 59 | Admitting: Psychiatry

## 2013-03-15 ENCOUNTER — Encounter (HOSPITAL_COMMUNITY): Payer: Self-pay | Admitting: Psychiatry

## 2013-03-15 VITALS — BP 140/76 | HR 72 | Ht 76.0 in | Wt 289.5 lb

## 2013-03-15 DIAGNOSIS — F429 Obsessive-compulsive disorder, unspecified: Secondary | ICD-10-CM

## 2013-03-15 DIAGNOSIS — F4001 Agoraphobia with panic disorder: Secondary | ICD-10-CM

## 2013-03-15 DIAGNOSIS — F3342 Major depressive disorder, recurrent, in full remission: Secondary | ICD-10-CM

## 2013-03-15 DIAGNOSIS — F4310 Post-traumatic stress disorder, unspecified: Secondary | ICD-10-CM

## 2013-03-15 MED ORDER — CLONAZEPAM 0.5 MG PO TABS: 0.5000 mg | ORAL_TABLET | Freq: Three times a day (TID) | ORAL | Status: DC | PRN

## 2013-03-15 MED ORDER — ARIPIPRAZOLE 10 MG PO TABS: 10.0000 mg | ORAL_TABLET | Freq: Every day | ORAL | Status: DC

## 2013-03-15 MED ORDER — NALTREXONE HCL 50 MG PO TABS: 50.0000 mg | ORAL_TABLET | Freq: Every day | ORAL | Status: DC

## 2013-03-15 NOTE — Progress Notes (Signed)
[facility] Behavioral Health Follow-up Outpatient Visit  Stephen Myers 07-06-1961  Date: 03/15/2013  HISTORY OF CURRENT ILLNESS:   Stephen Myers is a 52 Y/O male with a past psychiatric history significant for Major Depression, recurrent, moderate. Obsessive Compulsive Disorder, Anxiety Disorder, NOS. The patient is referred for psychiatric services for medication management.  The patient reports that he had some fleeting suicidal thoughts last week when his wife was gone.  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  He states he has continued his exposure therapy by going into other territories and step into stories.  The patient denies current suicidal ideation, intent, or plans. He  denies any current homicidal ideation, plans or intent. Patient denies auditory hallucinations. Patient denies visual hallucinations. The patient endorses paranoia about his job safety and what his colleagues and supervisors are thinking about him.. Patient states sleep is increased with 9 hours per night, but he feels like he is drowsy in the morning.  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  Energy level is low.  He reports feeling like he has to drag himself out of bed every day.  Denies any recent episodes consistent with mania, particularly decreased need for sleep with increased energy, grandiosity, impulsivity, hyperverbal and pressured speech, or increased productivity. Denies any recent symptoms consistent with psychosis, particularly auditory or visual hallucinations, thought broadcasting/insertion/withdrawal, or ideas of reference.    Review of Systems  Constitutional: Negative for fever, chills and weight loss.  Respiratory:  Positive for shortness of breath (With panic attacks.). Negative for cough, sputum production and wheezing.   Cardiovascular: Negative for chest pain, palpitations and leg swelling.  Gastrointestinal: Negative for nausea, vomiting, abdominal pain, diarrhea and constipation.  Musculoskeletal:       No gait abnormalities or muscle weakness.  Neurological: Positive for sensory change (Burning sensation in leg. ).   Filed Vitals:   03/15/13 1012  BP: 140/76  Pulse: 72  Height: 6\' 4"  (1.93 m)  Weight: 289 lb 8 oz (131.316 kg)   Physical Exam  Constitutional: He appears well-developed and well-nourished. No acute physical distress. Patient endorses hopelessness, helplessness and guilt.  Skin: He is not diaphoretic. Musculoskeletal: Strength & Muscle Tone: within normal limits Gait & Station: normal Patient leans: N/A'    HISTORY OF SUICIDAL ACTS AND SELF-HARM:  None.   HISTORY OF VIOLENCE/ASSAULTING OTHERS/LEGAL PROBLEMS:  No current legal issues.   SUBSTANCE USE HISTORY:  Caffeine: Coffee 2 cups per day. Caffeinated Beverages (green tea) 16 ounces per day.  Nicotine: Patient denies.  Alcohol: 1 drinks a week Drugs: Patient denies.   MENTAL ILLNESS AND SUBSTANCE ABUSE IN FAMILY MEMBERS: Reviewed  Psychiatric illness: Father-Bipolar Disorder. Mother-depression.  Substance abuse: Paternal-Alcoholism  Suicides: Patient denied   MILITARY HISTORY:  [employer]   Allergies: No Known Allergies   MEDICAL INFORMATION  Past Medical History  Diagnosis Date  . Irritability and anger     symptoms at home and work  . Anxiety   . Depression   . HIV infection    b) CURRENT PRESCRIBED MEDICATIONS:  Current Outpatient Prescriptions on File Prior to Visit  Medication Sig Dispense Refill  . ARIPiprazole (ABILIFY) 10 MG tablet Take 1 tablet (  10 mg total) by mouth daily.  30 tablet  1  . aspirin EC 81 MG tablet Take 1 tablet (81 mg total) by mouth daily.  90 tablet  3  . atorvastatin  (LIPITOR) 40 MG tablet Take 1 tablet (40 mg total) by mouth daily.  90 tablet  3  . clonazePAM (KLONOPIN) 0.5 MG tablet Take 1 tablet (0.5 mg total) by mouth 3 (three) times daily as needed for anxiety.  21 tablet  1  . lithium carbonate 300 MG capsule Take 2 capsules (600 mg total) by mouth daily with supper.  60 capsule  1  . losartan (COZAAR) 50 MG tablet TAKE 1 TABLET (50 MG TOTAL) BY MOUTH DAILY.  30 tablet  3  . naltrexone (DEPADE) 50 MG tablet Take 1 tablet (50 mg total) by mouth daily.  30 tablet  1  . propranolol (INDERAL) 10 MG tablet Take 2 tablets (20 mg total) by mouth as needed.  60 tablet  1  . [DISCONTINUED] clomiPRAMINE (ANAFRANIL) 25 MG capsule Take 1 capsule (25 mg total) by mouth at bedtime.  30 capsule  0   No current facility-administered medications on file prior to visit.    e) CURRENT OTC MEDICATIONS  1) Vitamins  2) Fish oil  3) Vit B   ALLERGIES/ ADVERSE REACTIONS:  1) None   Mental Status Examination/Evaluation:  Objective: Appearance: Fairly Groomed, Patient appears anxious during the interview.   Eye Contact: fair   Speech: Some decrease of difficulty with speech, stuttering  and forming sentencing with increased anxiety.   Volume: Normal   Mood: "depressed."  2-3/10  (0=Very depressed; 5=Neutral; 10=Very Happy)   Affect: Full range, congruent   Thought Process: Coherent and Goal Directed   Orientation: Oriented to person, place, year, and month, got date wrong.  Thought Content: Within normal limits with the exception of paranoia regarding people taking advantage of him.   Suicidal Thoughts: Patient denies suicidal ideation, intent or plans today.   Homicidal Thoughts: No   Memory: Immediate; Good  Recent:   Judgement: Good   Insight: Fair   Psychomotor Activity: Normal   Concentration:Fair   Memory: intact immediate 3/3; impaired recent 3/3   Akathisia: No   Handed: Right   AIMS (if indicated): not indicates   Assets:  Desire for Improvement   Financial Resources/Insurance  Housing  Social Support    Lab Results: No results found for this or any previous visit (from the past 48 hour(S)).   SUMMARY AND FORMULATION:   ASSESSMENT:  Axis I: Post Traumatic Stress disorder, Panic Disorder with Agoraphobia, Obsessive Compulsive Disorder, Rule out Bipolar II Disorder  Axis II: No diagnosis.  Axis III: No diagnosis  Axis IV: Job related stressors, familial stressors.  Axis V: GAF: 48  PLAN:  1. Affirm with the patient that the medications are taken as ordered. Patient expressed understanding of how their medications were to be used.  2. Continue the following psychiatric medications as written prior to this appointment with the following changes:  A) Continue naltrexone 50 mg daily B) Will continue clonazepam C) Will continue Abilify to 10 mg daily. D) Continue Lithium 600 mg will consider further increase as needed. E) Will try propranolol at a later date. F) Given his current severity of symptoms recommend short term disability with the possibility of long term disability. 3. Therapy: brief supportive therapy provided. Discussed psychosocial stressors. Continue individual outpatient therapy. Continue exposure to increasing level of stress and exposure. EMDR therapy. 4.  Risks and benefits, side effects and alternatives discussed with patient, he was given an opportunity to ask questions about his medication, illness, and treatment. All current psychiatric medications have been reviewed and discussed with the patient and adjusted as clinically appropriate. The patient has been provided an accurate and updated list of the medications being now prescribed.  5. Patient told to call clinic if any problems occur. Patient advised to go to ER if he should develop SI/HI, side effects, or if symptoms worsen. Has crisis numbers to call if needed.  6. Will order Lithium Level, BUN, Creatinine, TSH prior to next appointment. 7. The patient was  encouraged to keep PCP appointment, secondary to blood pressure and start medications as advised by PCP.  8. Patient was instructed to return to clinic in 2 weeks.  9. The patient expressed understanding of the plan outlined above and agrees with the plan.   Jacqulyn Cane, M.D.  03/15/2013 10:08 AM

## 2013-03-16 ENCOUNTER — Other Ambulatory Visit (HOSPITAL_COMMUNITY): Payer: Self-pay | Admitting: Psychiatry

## 2013-03-29 ENCOUNTER — Ambulatory Visit (INDEPENDENT_AMBULATORY_CARE_PROVIDER_SITE_OTHER): Payer: 59 | Admitting: Behavioral Health

## 2013-03-29 ENCOUNTER — Encounter (HOSPITAL_COMMUNITY): Payer: Self-pay | Admitting: Behavioral Health

## 2013-03-29 DIAGNOSIS — F411 Generalized anxiety disorder: Secondary | ICD-10-CM

## 2013-03-29 NOTE — Progress Notes (Signed)
   THERAPIST PROGRESS NOTE  Session Time: 9:00  Participation Level: Active  Behavioral Response: NeatAlertAnxious  Type of Therapy: Individual Therapy  Treatment Goals addressed: Coping  Interventions: CBT  Summary: Stephen Myers is a 52 y.o. male who presents with adhd.   Suicidal/Homicidal: Nowithout intent/plan  Therapist Response: The client traveled with his wife last week is a part of her work. He reported some anxiety in being in new places but stated that he was comforted being with her. He did report some passive suicidal thoughts but indicated that being with his wife helped with his anxiety. He currently denies any suicidal ideation. He did say that 2 weeks ago he attempted to go into some different places as recommended by myself and Dr. Demetrius Charity. for gradual exposure therapy he stated that did not go well saying he became incredibly anxious. He did say for the first time he is starting to realize that this anxiety is not something that he chose or wants is also starting to realize that he cannot control like he has other things in his life. I told him that was a big step for him he needed to build on that. We talked about what letting go means in various areas of his life such as making phone calls which is difficult for him. He often feels embarrassed or feels that he is being judged when cognitively he recognizes he is not. He used the fact that he has to call his insurance company today as something that creates anxiety. We spent some time talking about ways to reduce anxiety. He indicated that his wife optimistic that call for him but he told her that even if she was allowed to he couldn't let her because that would be enabling behavior. I encouraged him in his recognition of that and we talked about again ways to make a phone call easier. The client does have some anxiety in terms of thinking about going back to work so we reviewed anxiety coping skills. The client does contract  for safety saying he has no thoughts of hurting himself.  Plan: Return again in 2 weeks.  Diagnosis: Axis I: 313/296.32    Axis II: Deferred    [name], [facility] 03/29/2013

## 2013-03-30 ENCOUNTER — Ambulatory Visit (INDEPENDENT_AMBULATORY_CARE_PROVIDER_SITE_OTHER): Payer: 59 | Admitting: Psychiatry

## 2013-03-30 ENCOUNTER — Encounter (HOSPITAL_COMMUNITY): Payer: Self-pay | Admitting: Psychiatry

## 2013-03-30 VITALS — BP 130/81 | HR 73 | Ht 76.0 in | Wt 283.0 lb

## 2013-03-30 DIAGNOSIS — F4001 Agoraphobia with panic disorder: Secondary | ICD-10-CM

## 2013-03-30 DIAGNOSIS — F4310 Post-traumatic stress disorder, unspecified: Secondary | ICD-10-CM

## 2013-03-30 DIAGNOSIS — F3342 Major depressive disorder, recurrent, in full remission: Secondary | ICD-10-CM

## 2013-03-30 DIAGNOSIS — F429 Obsessive-compulsive disorder, unspecified: Secondary | ICD-10-CM

## 2013-03-30 MED ORDER — CLONAZEPAM 0.5 MG PO TABS: 0.5000 mg | ORAL_TABLET | Freq: Three times a day (TID) | ORAL | Status: DC | PRN

## 2013-03-30 NOTE — Progress Notes (Signed)
[facility] Behavioral Health Follow-up Outpatient Visit  Stephen Myers 1960/12/29  Date: 03/30/2013  HISTORY OF CURRENT ILLNESS:   Stephen Myers is a 51 Y/O male with a past psychiatric history significant for Major Depression, recurrent, moderate. Obsessive Compulsive Disorder, Anxiety Disorder, NOS. The patient is referred for psychiatric services for medication management.  The patient reports some depression and anxiety regarding the fact that he feels he cannot go back to work. He is starting to accept that his condition may be too much for him to hand. 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 He endorses periods of irritability while driving, particularly with people cutting him off and driving into his lane. He reports he is taking his medications and denies any side effects.   The patient denies current suicidal ideation, intent, or plans. He  denies any current homicidal ideation, plans or intent. Patient denies auditory hallucinations. Patient denies visual hallucinations. The patient endorses paranoia about his job safety and what his colleagues and supervisors are thinking about him.. Patient states sleep is increased with 8 hours per night, broken with waking up 1-2 times per night. He is no longer obsessing about number at night but has been having that problems in the morning.  Energy level is low to medium.  He continues to feel like he has to drag himself out of bed every day.  Denies any recent episodes consistent with mania, particularly decreased need for sleep with increased energy, grandiosity, impulsivity, hyperverbal and pressured speech, or increased productivity. Denies any recent symptoms consistent with psychosis, particularly auditory or visual hallucinations, thought broadcasting/insertion/withdrawal, or ideas of reference.     Review of Systems  Constitutional: Negative for fever, chills and weight loss.  Respiratory: Positive for shortness of breath (With panic attacks.). Negative for cough, sputum production and wheezing.   Cardiovascular: Negative for chest pain, palpitations and leg swelling.  Gastrointestinal: Negative for nausea, vomiting, abdominal pain, diarrhea and constipation.  Musculoskeletal:       No gait abnormalities or muscle weakness.  Neurological: Positive for sensory change (Burning sensation in leg. ).   Filed Vitals:   03/30/13 1009  BP: 130/81  Pulse: 73  Height: 6\' 4"  (1.93 m)  Weight: 283 lb (128.368 kg)   Physical Exam  Constitutional: He appears well-developed and well-nourished. No acute physical distress. Patient endorses hopelessness, helplessness and guilt.  Skin: He is not diaphoretic. Musculoskeletal: Strength & Muscle Tone: within normal limits Gait & Station: normal Patient leans: N/A'    HISTORY OF SUICIDAL ACTS AND SELF-HARM:  None.   HISTORY OF VIOLENCE/ASSAULTING OTHERS/LEGAL PROBLEMS:  No current legal issues.   SUBSTANCE USE HISTORY:  Caffeine: Coffee 2 cups per day. Caffeinated Beverages (green tea) 16 ounces per day.  Nicotine: Patient denies.  Alcohol: 1 drink a week. Drugs: Patient denies.   MENTAL ILLNESS AND SUBSTANCE ABUSE IN FAMILY MEMBERS: Reviewed  Psychiatric illness: Father-Bipolar Disorder. Mother-depression.  Substance abuse: Paternal-Alcoholism  Suicides: Patient denied   MILITARY HISTORY:  [employer]   Allergies: No Known Allergies   MEDICAL INFORMATION  Past Medical History  Diagnosis Date  . Irritability and anger     symptoms at home and work  . Anxiety   . Depression   . HIV infection    b) CURRENT PRESCRIBED MEDICATIONS:  Current Outpatient Prescriptions on File Prior to Visit  Medication Sig  Dispense Refill  . ABILIFY 10 MG tablet TAKE ONE TABLET BY MOUTH DAILY  30 tablet  1  . aspirin EC 81 MG tablet Take 1  tablet (81 mg total) by mouth daily.  90 tablet  3  . atorvastatin (LIPITOR) 40 MG tablet Take 1 tablet (40 mg total) by mouth daily.  90 tablet  3  . clonazePAM (KLONOPIN) 0.5 MG tablet Take 1 tablet (0.5 mg total) by mouth 3 (three) times daily as needed for anxiety.  21 tablet  1  . lithium carbonate 300 MG capsule Take 2 capsules (600 mg total) by mouth daily with supper.  60 capsule  1  . losartan (COZAAR) 50 MG tablet TAKE 1 TABLET (50 MG TOTAL) BY MOUTH DAILY.  30 tablet  3  . naltrexone (DEPADE) 50 MG tablet Take 1 tablet (50 mg total) by mouth daily.  30 tablet  1  . [DISCONTINUED] clomiPRAMINE (ANAFRANIL) 25 MG capsule Take 1 capsule (25 mg total) by mouth at bedtime.  30 capsule  0   No current facility-administered medications on file prior to visit.    e) CURRENT OTC MEDICATIONS  1) Vitamins  2) Fish oil  3) Vit B   ALLERGIES/ ADVERSE REACTIONS:  1) None   Mental Status Examination/Evaluation:  Objective: Appearance: Fairly Groomed, Patient appears anxious during the interview.   Eye Contact: fair   Speech: Some decrease of difficulty with speech, stuttering  and forming sentencing with increased anxiety.   Volume: Normal   Mood: "depressed, and a little anxious."  2-3/10  (0=Very depressed; 5=Neutral; 10=Very Happy)   Affect: Full range, congruent   Thought Process: Coherent and Goal Directed   Orientation: Oriented to person, place, year, and month, got date wrong.  Thought Content: Within normal limits with the exception of paranoia regarding people taking advantage of him.   Suicidal Thoughts: Patient denies suicidal ideation, intent or plans today.   Homicidal Thoughts: No   Memory: Immediate; Good  Recent:   Judgement: Good   Insight: Fair   Psychomotor Activity: Normal   Concentration:Fair   Memory: intact immediate 3/3; impaired recent 2/3   Akathisia: No   Handed: Right   AIMS (if indicated): not indicates   Assets:  Desire for Improvement  Financial  Resources/Insurance  Housing  Social Support    Lab Results: No results found for this or any previous visit (from the past 48 hour(S)).   SUMMARY AND FORMULATION:   ASSESSMENT:  Axis I: Post Traumatic Stress disorder, Panic Disorder with Agoraphobia, Obsessive Compulsive Disorder, Rule out Bipolar II Disorder  Axis II: No diagnosis.  Axis III: No diagnosis  Axis IV: Job related stressors, familial stressors.  Axis V: GAF: 46  PLAN:  1. Affirm with the patient that the medications are taken as ordered. Patient expressed understanding of how their medications were to be used.  2. Continue the following psychiatric medications as written prior to this appointment with the following changes:  A) Continue naltrexone 50 mg daily B) Will continue clonazepam 0.5 mg TID PRN C) Will continue Abilify to 10 mg daily. Will consider increase in this medication at next visit. D) Continue Lithium 600 mg will consider further increase as needed. E) Will try propranolol at a later date. F) Given his current severity of continue to recommend short term disability with the possibility of long term disability. 3. Therapy: brief supportive therapy provided. Discussed psychosocial stressors. Continue individual outpatient therapy. Continue exposure to increasing level of stress and  exposure.  Continue EMDR therapy. 4. Risks and benefits, side effects and alternatives discussed with patient, he was given an opportunity to ask questions about his medication, illness, and treatment. All current psychiatric medications have been reviewed and discussed with the patient and adjusted as clinically appropriate. The patient has been provided an accurate and updated list of the medications being now prescribed.  5. Patient told to call clinic if any problems occur. Patient advised to go to ER if he should develop SI/HI, side effects, or if symptoms worsen. Has crisis numbers to call if needed.  6. Will order Lithium  Level, BUN, Creatinine, TSH prior to next appointment. 7. The patient was encouraged to keep PCP appointment, secondary to blood pressure and start medications as advised by PCP.  8. Patient was instructed to return to clinic in 2 weeks.  9. The patient expressed understanding of the plan outlined above and agrees with the plan.   Jacqulyn Cane, M.D.  03/30/2013 10:10 AM

## 2013-04-07 ENCOUNTER — Telehealth (HOSPITAL_COMMUNITY): Payer: Self-pay

## 2013-04-07 DIAGNOSIS — F3342 Major depressive disorder, recurrent, in full remission: Secondary | ICD-10-CM

## 2013-04-07 DIAGNOSIS — F4001 Agoraphobia with panic disorder: Secondary | ICD-10-CM

## 2013-04-07 MED ORDER — CLONAZEPAM 0.5 MG PO TABS: 0.5000 mg | ORAL_TABLET | Freq: Three times a day (TID) | ORAL | Status: DC | PRN

## 2013-04-07 NOTE — Telephone Encounter (Signed)
Will provide prescription. Will continue 7 days supply only.

## 2013-04-13 ENCOUNTER — Ambulatory Visit (HOSPITAL_COMMUNITY): Payer: Self-pay | Admitting: Behavioral Health

## 2013-04-21 ENCOUNTER — Ambulatory Visit (HOSPITAL_COMMUNITY): Payer: Self-pay | Admitting: Psychiatry

## 2013-04-25 ENCOUNTER — Encounter (HOSPITAL_COMMUNITY): Payer: Self-pay | Admitting: Psychiatry

## 2013-04-25 ENCOUNTER — Other Ambulatory Visit (HOSPITAL_COMMUNITY): Payer: Self-pay | Admitting: Psychiatry

## 2013-04-25 ENCOUNTER — Ambulatory Visit (INDEPENDENT_AMBULATORY_CARE_PROVIDER_SITE_OTHER): Payer: 59 | Admitting: Psychiatry

## 2013-04-25 VITALS — BP 118/85 | HR 75 | Ht 76.0 in | Wt 284.0 lb

## 2013-04-25 DIAGNOSIS — F429 Obsessive-compulsive disorder, unspecified: Secondary | ICD-10-CM

## 2013-04-25 DIAGNOSIS — F3342 Major depressive disorder, recurrent, in full remission: Secondary | ICD-10-CM

## 2013-04-25 DIAGNOSIS — F4001 Agoraphobia with panic disorder: Secondary | ICD-10-CM

## 2013-04-25 DIAGNOSIS — F4310 Post-traumatic stress disorder, unspecified: Secondary | ICD-10-CM

## 2013-04-25 MED ORDER — NALTREXONE HCL 50 MG PO TABS: 50.0000 mg | ORAL_TABLET | Freq: Every day | ORAL | Status: DC

## 2013-04-25 MED ORDER — ARIPIPRAZOLE 10 MG PO TABS: 10.0000 mg | ORAL_TABLET | Freq: Every day | ORAL | Status: DC

## 2013-04-25 MED ORDER — CLONAZEPAM 0.5 MG PO TABS: 0.5000 mg | ORAL_TABLET | Freq: Three times a day (TID) | ORAL | Status: DC | PRN

## 2013-04-25 MED ORDER — LITHIUM CARBONATE 300 MG PO CAPS: 600.0000 mg | ORAL_CAPSULE | Freq: Every day | ORAL | Status: DC

## 2013-04-25 NOTE — Progress Notes (Signed)
[facility] Follow-up Outpatient Visit  Stephen Myers 11-Jan-1961  Date: 04/25/2013  HISTORY OF CURRENT ILLNESS:   [name] is a 52 Y/O male with a past psychiatric history significant for Major Depression, recurrent, moderate. Obsessive Compulsive Disorder, Anxiety Disorder, NOS. The patient is referred for psychiatric services for medication management.  The patient reports that he has been getting more anxious.  He has recurrent thoughts of death, without active plans or intent of suicide.  He is having difficulty with the prospect of having to go on further disability or permanently. 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  He admits to feeling that he would have be able to accept disability if he had had a heart attack or stroke.  He reports his daughter has not been doing well emotionally as well and this has worsened his mood.   He reports he is taking his medications and denies any side effects.   The patient denies current suicidal ideation, intent, or plans. He  denies any current homicidal ideation, plans or intent. Patient denies auditory hallucinations. Patient denies visual hallucinations. The patient endorses continued  paranoia about  what his colleagues and supervisors are thinking about him.. Patient states sleep is increased with 8-10 hours per night, broken with waking up 1 times per night. He is no longer obsessing about number at night but has been having that problems in the morning.  Energy level is low.  He continues to feel like he has to drag himself out of bed every day.  Denies any recent episodes consistent with mania, particularly decreased need for sleep with increased energy, grandiosity, impulsivity, hyperverbal and pressured speech, or increased productivity. Denies any recent symptoms consistent with psychosis, particularly auditory or visual hallucinations,  thought broadcasting/insertion/withdrawal, or ideas of reference.    Review of Systems  Constitutional: Negative for fever, chills and weight loss.  Respiratory: Positive for shortness of breath (With panic attacks.). Negative for cough, sputum production and wheezing.   Cardiovascular: Negative for chest pain, palpitations and leg swelling.  Gastrointestinal: Negative for nausea, vomiting, abdominal pain, diarrhea and constipation.  Musculoskeletal:       No gait abnormalities or muscle weakness.  Neurological: Positive for sensory change (Burning sensation in leg. ).   Filed Vitals:   04/25/13 1107  BP: 118/85  Pulse: 75  Height: 6\' 4"  (1.93 m)  Weight: 284 lb (128.822 kg)   Physical Exam  Constitutional: He appears well-developed and well-nourished. No acute physical distress. Patient endorses hopelessness, helplessness and guilt.  Skin: He is not diaphoretic. Musculoskeletal: Strength & Muscle Tone: within normal limits Gait & Station: normal Patient leans: N/A'    HISTORY OF SUICIDAL ACTS AND SELF-HARM:  None.   HISTORY OF VIOLENCE/ASSAULTING OTHERS/LEGAL PROBLEMS:  No current legal issues.   SUBSTANCE USE HISTORY:  Caffeine: Coffee 2 cups per day. Caffeinated Beverages (green tea) 16 ounces per day.  Nicotine: Patient denies.  Alcohol:  Less than one drink every two weeks. Drugs: Patient denies.   MENTAL ILLNESS AND SUBSTANCE ABUSE IN FAMILY MEMBERS: Reviewed  Psychiatric illness: Father-Bipolar Disorder. Mother-depression.  Substance abuse: Paternal-Alcoholism  Suicides: Patient denied   MILITARY HISTORY:  [employer]   Allergies: No Known Allergies   MEDICAL INFORMATION  Past Medical History  Diagnosis Date  . Irritability and anger     symptoms at home and work  . Anxiety   .  Depression   . HIV infection    b) CURRENT PRESCRIBED MEDICATIONS:  Current Outpatient Prescriptions on File Prior to Visit  Medication Sig Dispense Refill  . ABILIFY 10 MG  tablet TAKE ONE TABLET BY MOUTH DAILY  30 tablet  1  . aspirin EC 81 MG tablet Take 1 tablet (81 mg total) by mouth daily.  90 tablet  3  . atorvastatin (LIPITOR) 40 MG tablet Take 1 tablet (40 mg total) by mouth daily.  90 tablet  3  . clonazePAM (KLONOPIN) 0.5 MG tablet Take 1 tablet (0.5 mg total) by mouth 3 (three) times daily as needed for anxiety.  21 tablet  1  . lithium carbonate 300 MG capsule Take 2 capsules (600 mg total) by mouth daily with supper.  60 capsule  1  . losartan (COZAAR) 50 MG tablet TAKE 1 TABLET (50 MG TOTAL) BY MOUTH DAILY.  30 tablet  3  . naltrexone (DEPADE) 50 MG tablet Take 1 tablet (50 mg total) by mouth daily.  30 tablet  1  . [DISCONTINUED] clomiPRAMINE (ANAFRANIL) 25 MG capsule Take 1 capsule (25 mg total) by mouth at bedtime.  30 capsule  0   No current facility-administered medications on file prior to visit.    e) CURRENT OTC MEDICATIONS  1)  Vit B   ALLERGIES/ ADVERSE REACTIONS:  1) None   Psychiatric Specialty Examination:  Objective: Appearance: Fairly Groomed, Patient appears anxious during the interview.   Eye Contact: fair   Speech: Some decrease of difficulty with speech, stuttering  and forming sentencing with increased anxiety.   Volume: Normal   Mood: "fearful, afraid and anxious"  3/10  (0=Very depressed; 5=Neutral; 10=Very Happy) Anxiety=7/10  (0=No anxiety; 5=Moderate anxiety; 10=Panic Attack)   Affect: Full range, congruent   Thought Process: Coherent and Goal Directed   Orientation: Oriented to person, place, year, and month, got date wrong.  Thought Content: Within normal limits with the exception of paranoia regarding people taking advantage of him.   Suicidal Thoughts: Patient denies suicidal ideation, intent or plans today.   Homicidal Thoughts: No   Memory: Immediate; Good  Recent:   Judgement: Good   Insight: Fair   Psychomotor Activity: Normal   Concentration:Fair   Memory: intact immediate 3/3; impaired recent 2/3    Akathisia: No   Handed: Right   AIMS (if indicated): not indicates   Assets:  Desire for Improvement  Financial Resources/Insurance  Housing  Social Support    Lab Results: No results found for this or any previous visit (from the past 48 hour(S)).   SUMMARY AND FORMULATION:   ASSESSMENT:  Axis I: Post Traumatic Stress disorder, Panic Disorder with Agoraphobia, Obsessive Compulsive Disorder, Rule out Bipolar II Disorder  Axis II: No diagnosis.  Axis III: No diagnosis  Axis IV: Job related stressors, familial stressors.  Axis V: GAF: 45  PLAN:  1. Affirm with the patient that the medications are taken as ordered. Patient expressed understanding of how their medications were to be used.  2. Continue the following psychiatric medications as written prior to this appointment with the following changes:  A) Continue naltrexone 50 mg daily B) Will continue clonazepam 0.5 mg TID PRN C) Will continue Abilify to 10 mg daily. Will consider increase in this medication at next visit. D) Continue Lithium 600 mg will consider further increase as needed. E) Given his current severity of symptoms will continue to recommend short term disability with the possibility of long term disability. 3.  Therapy: brief supportive therapy provided. Discussed psychosocial stressors. Continue individual outpatient therapy. Continue exposure to increasing level of stress and exposure.  Continue EMDR therapy. 4. Risks and benefits, side effects and alternatives discussed with patient, he was given an opportunity to ask questions about his medication, illness, and treatment. All current psychiatric medications have been reviewed and discussed with the patient and adjusted as clinically appropriate. The patient has been provided an accurate and updated list of the medications being now prescribed.  5. Patient told to call clinic if any problems occur. Patient advised to go to ER if he should develop SI/HI, side  effects, or if symptoms worsen. Has crisis numbers to call if needed.  6. Will order Lithium Level, BUN, Creatinine, TSH at next appointment. 7. The patient was encouraged to keep PCP appointment, secondary to blood pressure and start medications as advised by PCP.  8. Patient was instructed to return to clinic in 2 weeks.  9. The patient expressed understanding of the plan outlined above and agrees with the plan.   Jacqulyn Cane, M.D.  04/25/2013 11:07 AM

## 2013-04-28 ENCOUNTER — Ambulatory Visit (INDEPENDENT_AMBULATORY_CARE_PROVIDER_SITE_OTHER): Payer: 59 | Admitting: Behavioral Health

## 2013-04-28 ENCOUNTER — Encounter (HOSPITAL_COMMUNITY): Payer: Self-pay | Admitting: Behavioral Health

## 2013-04-28 DIAGNOSIS — F411 Generalized anxiety disorder: Secondary | ICD-10-CM

## 2013-04-28 NOTE — Progress Notes (Signed)
   THERAPIST PROGRESS NOTE  Session Time: 1:00  Participation Level: Active  Behavioral Response: CasualAlertAnxious  Type of Therapy: Individual Therapy  Treatment Goals addressed: Coping  Interventions: CBT  Summary: Stephen Myers is a 52 y.o. male who presents with anxiety.   Suicidal/Homicidal: Nowithout intent/plan  Therapist Response: The client stated that he did not feel like he had made significant progress. He indicates that he has become more anxious even in a work-related spaces stating that he gets nervous when he thinks about taking a walk in his neighborhood. He stated that he time to look up his diagnosis of agoraphobia. He initially indicated that it made him more anxious as I read the definition to him from the DSM 5 she better understood his diagnosis. We spoke at length today in the session about how the clients daughter is doing. He suspects that she was high again recently and recognizes that his recent increase in anxiety is probably significantly related to how well his daughter is doing. We processed how he can separate himself from the choices that his daughter is making. He recognizes that she probably is high when something very good or something bad happens. He at least intellectually recognizes that he cannot control that so we talked about how he could emotionally separate himself and not from his daughter, but from his daughters choices. She has started working which he hopes will give her some purpose and focus.  The client will continue on short-term disability and thinks it will last until November of 2014. He stated that for the first time he has considered looking into long-term disability and thinks he will talk to the human resources department at his company to see what that involves. He did not appear to see that as getting up around her recognize the fact that he is not able to work at a level that he wants to or feels like he can and wants to  consider all options. The client does contract for safety saying he doesn't have thoughts of hurting himself or anyone else. He indicated that he has done a much better job with his agitation and irritation especially directed at his daughter. He states that he knows that he is the only one that suffers from that when he loses his temper with her.  Plan: Return again in 3 weeks.  Diagnosis: Axis I: 300.02    Axis II: Deferred    [name], [facility] 04/28/2013

## 2013-05-27 ENCOUNTER — Encounter (HOSPITAL_COMMUNITY): Payer: Self-pay | Admitting: Behavioral Health

## 2013-05-27 ENCOUNTER — Ambulatory Visit (INDEPENDENT_AMBULATORY_CARE_PROVIDER_SITE_OTHER): Payer: 59 | Admitting: Behavioral Health

## 2013-05-27 ENCOUNTER — Telehealth (HOSPITAL_COMMUNITY): Payer: Self-pay

## 2013-05-27 DIAGNOSIS — F331 Major depressive disorder, recurrent, moderate: Secondary | ICD-10-CM

## 2013-05-27 DIAGNOSIS — F411 Generalized anxiety disorder: Secondary | ICD-10-CM

## 2013-05-27 NOTE — Progress Notes (Addendum)
   THERAPIST PROGRESS NOTE  Session Time: 10:00  Participation Level: Active  Behavioral Response: CasualAlertDepressed  Type of Therapy: Individual Therapy  Treatment Goals addressed: Coping  Interventions: CBT  Summary: Coulton Schlink is a 52 y.o. male who presents with anxiety and depression.   Suicidal/Homicidal: Nowithout intent/plan  Therapist Response: The client indicated that he is coming to the point where he knows that he cannot return to work at the productivity level that he was working at. He still has significant anxiety every time he has gone back to work  And whenever he anticipates returning to work  He stated that he is not completely at peace with that decision yet but is in the process of recognizing that he may need to go on long-term disability. He reemphasized that he was brought up to have a strong work ethic and always feels the need to provide for his family.   He stated that he has not talked to his employer about that yet but will do so to see what that will involve. 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 We spent significant time talking about how he is feeling about the possibility of not being able to work in the type of job that he has been working in for almost 30 years. He still recognizes a need and desire to do something productive but knows that he does not have to make that decision now.  He indicated that for the second time they have gone to the funeral for a friend of their daughters who overdosed on heroin. He indicated that it is almost the one year anniversary of his daughter's boyfriend's death  and that has been difficult for all of the family. He feels he is doing a better job in terms of setting boundaries meaning that he does not as easily allow his daughter's difficulties to affect his. He feels that she is making strides which makes him feel better.  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 The client recognizes that he cannot mentally and emotionally worked at a level he has and it is beginning to process how he can be productive in a way that brings some peace and satisfaction. The client reports that his family and friends continue to be supportive. He does contract for safety saying he has no thoughts of hurting himself or anyone else.  Plan: Return again in 3 weeks.  Diagnosis: Axis I: 296.32/300.02 and 1 a fill and     Axis II: Deferred    Balinda Heacock M, [facility] 05/27/2013

## 2013-05-30 ENCOUNTER — Telehealth (HOSPITAL_COMMUNITY): Payer: Self-pay

## 2013-05-30 ENCOUNTER — Telehealth (HOSPITAL_COMMUNITY): Payer: Self-pay | Admitting: Behavioral Health

## 2013-05-30 NOTE — Telephone Encounter (Signed)
I spoke with the client is morning who indicated that a contract psychologist, Mr. Virginia Rochester for [employer] had  called our office on Friday, July 25 to speak to Dr. Hilton Cork. Dr. Demetrius Charity. is out on maternity leave. The client indicated that Mr. Virginia Rochester had called the clients employer. The client asked if I would call Mr. or to see his questions. I did call Mr. Virginia Rochester who indicated that he felt it would be best if he spoke to Dr. Demetrius Charity or if I had permission to speak on his behalf. I told Mr. Virginia Rochester that it would be best if he spoke to Dr. Demetrius Charity. and that he would be back from maternity leave on August 4. I did call Dr. Demetrius Charity. and informed him of the situation. He indicated that he would call Mr.Orr when he returned on August 4. I left a voicemail for the client on his cell phone telling him of the actions I took.

## 2013-05-30 NOTE — Telephone Encounter (Signed)
Error

## 2013-06-03 ENCOUNTER — Other Ambulatory Visit: Payer: Self-pay | Admitting: Sports Medicine

## 2013-06-06 ENCOUNTER — Encounter (HOSPITAL_COMMUNITY): Payer: Self-pay | Admitting: Behavioral Health

## 2013-06-06 ENCOUNTER — Telehealth (HOSPITAL_COMMUNITY): Payer: Self-pay | Admitting: Psychiatry

## 2013-06-06 NOTE — Telephone Encounter (Signed)
Discussed with the patient's disability claims manager Ellender Hose; [phone]), about patient's condition. Informed patient of the same.

## 2013-06-09 ENCOUNTER — Telehealth (HOSPITAL_COMMUNITY): Payer: Self-pay

## 2013-06-09 NOTE — Telephone Encounter (Signed)
Stephen Myers is denying disability based on fact not seen dr. Demetrius Charity in last month.

## 2013-06-09 NOTE — Telephone Encounter (Signed)
Called patient. Will speak to disability insurance.

## 2013-06-10 ENCOUNTER — Ambulatory Visit (HOSPITAL_COMMUNITY): Payer: Self-pay | Admitting: Behavioral Health

## 2013-06-13 ENCOUNTER — Encounter (HOSPITAL_COMMUNITY): Payer: Self-pay | Admitting: Behavioral Health

## 2013-06-13 ENCOUNTER — Ambulatory Visit (INDEPENDENT_AMBULATORY_CARE_PROVIDER_SITE_OTHER): Payer: 59 | Admitting: Behavioral Health

## 2013-06-13 ENCOUNTER — Telehealth (HOSPITAL_COMMUNITY): Payer: Self-pay | Admitting: Psychiatry

## 2013-06-13 DIAGNOSIS — F332 Major depressive disorder, recurrent severe without psychotic features: Secondary | ICD-10-CM

## 2013-06-13 NOTE — Telephone Encounter (Signed)
Called patient's insurance company and clarified that patient has continued visits with therapist.

## 2013-06-13 NOTE — Progress Notes (Addendum)
   THERAPIST PROGRESS NOTE  Session Time: 8:00  Participation Level: Active  Behavioral Response: CasualAlertDepressed  Type of Therapy: Family Therapy  Treatment Goals addressed: Coping  Interventions: CBT  Summary: Stephen Myers is a 52 y.o. male who presents with depression.   Suicidal/Homicidal: Nowithout intent/plan  Therapist Response: The client had spoken to a representative from his insurance company who indicated on 06/09/2013 that the client would not be approved for short-term disability. He indicated that there is an appeal process in which he can send a letter appealing the decision. The client has prepared the letter. The client indicated that on Sunday he told his wife that he saw no other way out of the situation. When I asked him to clarify he stated that he was feeling suicidal on Sunday, August 10 saying that he had tried to go back to work, wanted to go to work but could not mentally or emotionally do so. He stated that hearing his short-term disability was denied made him feel suicidal. He does contract for safety today saying he is not suicidal but cannot see how this situation can go well. His wife did say that she or his daughter someone would be with him all of the time. He did contract to call our clinic if it's during the day, 911 or the suicide hotline if he felt suicidal. He will also see Dr. Hilton Cork on Tuesday, August 12.  The client expresses some uncertainty as to what will happen at work which he said  raises his anxiety level significantly. He reports anxiety levels increasing due to now only having his  wife's income. I did speak with Dr.Puthevel who indicated that he would call the clients social worker at [employer] whose name is Psychologist, sport and exercise. I did get her name, phone number and fax number. Dr. Demetrius Charity. will speak with her about the appeal. We will send the clients appeal letter as well as any other documentation that may be helpful..  The client wanted to make sure  in speaking with the [employer] representative, that she understood that the client did have psychiatric coverage even while Dr. Hilton Cork was on paternity leave.  The client was tearful and depressed throughout the session. The client also indicated that his boss Forde Radon was let go last week and AJ was his biggest supporter at work. The client will speak to someone in upper management and possibly human resources to see what the next step is that he needs to take at work. Dr. Demetrius Charity. suggested that we submit he appeal for the short-term and begin the process for long-term disability. We discussed other options in addition to appealing the short-term disability decision.. We reviewed coping skills for the client. He did contract for safety today saying he is not thinking of hurting himself or anyone else but will let his family,  one of our staff here, or call 911 or the suicide hotline if he becomes actively suicidal.  Plan: Return again in 1 weeks.  Diagnosis: Axis I: 296.20    Axis II: Deferred    Stephen Myers, [facility] 06/13/2013

## 2013-06-14 ENCOUNTER — Ambulatory Visit (INDEPENDENT_AMBULATORY_CARE_PROVIDER_SITE_OTHER): Payer: 59 | Admitting: Psychiatry

## 2013-06-14 ENCOUNTER — Encounter (HOSPITAL_COMMUNITY): Payer: Self-pay | Admitting: Psychiatry

## 2013-06-14 VITALS — BP 131/83 | HR 89 | Ht 76.0 in | Wt 282.0 lb

## 2013-06-14 DIAGNOSIS — F429 Obsessive-compulsive disorder, unspecified: Secondary | ICD-10-CM

## 2013-06-14 DIAGNOSIS — F3342 Major depressive disorder, recurrent, in full remission: Secondary | ICD-10-CM

## 2013-06-14 DIAGNOSIS — F4001 Agoraphobia with panic disorder: Secondary | ICD-10-CM

## 2013-06-14 DIAGNOSIS — F4310 Post-traumatic stress disorder, unspecified: Secondary | ICD-10-CM

## 2013-06-14 MED ORDER — ARIPIPRAZOLE 10 MG PO TABS: 10.0000 mg | ORAL_TABLET | Freq: Every day | ORAL | Status: DC

## 2013-06-14 MED ORDER — NALTREXONE HCL 50 MG PO TABS: 50.0000 mg | ORAL_TABLET | Freq: Every day | ORAL | Status: DC

## 2013-06-14 MED ORDER — LITHIUM CARBONATE 300 MG PO CAPS: ORAL_CAPSULE | ORAL | Status: DC

## 2013-06-14 MED ORDER — CLONAZEPAM 0.5 MG PO TABS: 0.5000 mg | ORAL_TABLET | Freq: Three times a day (TID) | ORAL | Status: DC | PRN

## 2013-06-14 NOTE — Progress Notes (Signed)
[facility] Follow-up Outpatient Visit  Stephen Myers 1961/03/17  Date: 06/14/2013  HISTORY OF CURRENT ILLNESS:   Stephen Myers is a 52 Y/O male with a past psychiatric history significant for Major Depression, recurrent, moderate. Obsessive Compulsive Disorder, Anxiety Disorder, NOS. The patient is referred for psychiatric services for medication management.  The patient reports that he has has not been getting out of the house. He has been pacing around the house until he gives out.  The patient reports he tries to force himself to go out but has not been able to drive through his territory for over two months.  He has had difficulty even entering a convenience store even to use the bathroom. He reports he is taking his medications and denies any side effects.   The patient denies current suicidal ideation, intent, or plans. He reports suicidal ideation which started Last Thursday and continued until yesterday. He reports he had suicidal ideation, and plans.  He  denies any current homicidal ideation, plans or intent. Patient denies auditory hallucinations. Patient denies visual hallucinations. The patient endorses continued  paranoia about  what his colleagues and supervisors are thinking about him.. Patient states sleep is increased with 8-9 hours per night, broken with waking up 2 times per night. Energy level is low.  The patient had panic attacks in the past week with his last panic attack two days ago.  Denies any recent episodes consistent with mania, particularly decreased need for sleep with increased energy, grandiosity, impulsivity, hyperverbal and pressured speech, or increased productivity. Denies any recent symptoms consistent with psychosis, particularly auditory or visual hallucinations, thought broadcasting/insertion/withdrawal, or ideas of reference.    Review of Systems  Constitutional: Negative for fever, chills and weight loss.  Respiratory: Positive for shortness  of breath (With panic attacks.). Negative for cough, sputum production and wheezing.   Cardiovascular: Negative for chest pain, palpitations and leg swelling.  Gastrointestinal: Negative for nausea, vomiting, abdominal pain, diarrhea and constipation.  Musculoskeletal:       No gait abnormalities or muscle weakness.  Neurological: Positive for sensory change (Burning sensation in leg. ).   Filed Vitals:   06/14/13 1016  BP: 131/83  Pulse: 89  Height: 6\' 4"  (1.93 m)  Weight: 282 lb (127.914 kg)    Physical Exam  Constitutional: He appears well-developed and well-nourished. No acute physical distress. Patient endorses hopelessness, helplessness and guilt.  Skin: He is not diaphoretic. Musculoskeletal: Strength & Muscle Tone: within normal limits Gait & Station: normal Patient leans: N/A'    HISTORY OF SUICIDAL ACTS AND SELF-HARM:  None.   HISTORY OF VIOLENCE/ASSAULTING OTHERS/LEGAL PROBLEMS:  No current legal issues.   SUBSTANCE USE HISTORY:  Caffeine: Coffee 2 cups per day. Caffeinated Beverages (green tea) 16 ounces per day.  Nicotine: Patient denies.  Alcohol:  Less than one drink every two weeks. Drugs: Patient denies.   MENTAL ILLNESS AND SUBSTANCE ABUSE IN FAMILY MEMBERS: Reviewed  Psychiatric illness: Father-Bipolar Disorder. Mother-depression.  Substance abuse: Paternal-Alcoholism  Suicides: Patient denied   MILITARY HISTORY:  [employer]   Allergies: No Known Allergies   MEDICAL INFORMATION  Past Medical History  Diagnosis Date  . Irritability and anger     symptoms at home and work  . Anxiety   . Depression   . HIV infection    b) CURRENT PRESCRIBED MEDICATIONS:  Current Outpatient Prescriptions on File Prior to Visit  Medication Sig Dispense Refill  . ARIPiprazole (ABILIFY) 10 MG tablet Take 1 tablet (10  mg total) by mouth daily.  30 tablet  2  . aspirin EC 81 MG tablet Take 1 tablet (81 mg total) by mouth daily.  90 tablet  3  . atorvastatin  (LIPITOR) 40 MG tablet Take 1 tablet (40 mg total) by mouth daily.  90 tablet  3  . clonazePAM (KLONOPIN) 0.5 MG tablet Take 1 tablet (0.5 mg total) by mouth 3 (three) times daily as needed for anxiety.  21 tablet  5  . lithium carbonate 300 MG capsule Take 2 capsules (600 mg total) by mouth daily with supper.  60 capsule  1  . losartan (COZAAR) 50 MG tablet TAKE 1 TABLET (50 MG TOTAL) BY MOUTH DAILY.  30 tablet  0  . naltrexone (DEPADE) 50 MG tablet Take 1 tablet (50 mg total) by mouth daily.  30 tablet  1  . [DISCONTINUED] clomiPRAMINE (ANAFRANIL) 25 MG capsule Take 1 capsule (25 mg total) by mouth at bedtime.  30 capsule  0   No current facility-administered medications on file prior to visit.    e) CURRENT OTC MEDICATIONS  1)  Vit B   ALLERGIES/ ADVERSE REACTIONS:  1) None   Psychiatric Specialty Examination:  Objective: Appearance: Fairly Groomed, Patient is tearful through the interview.  Eye Contact: fair   Speech: Nonrmal  Volume: Normal   Mood: "dark"  0/10  (0=Very depressed; 5=Neutral; 10=Very Happy) Anxiety=7/10  (0=No anxiety; 5=Moderate anxiety; 10=Panic Attack) He reports anxiety was at a 10/10 until Yesterday.   Affect: Full range, congruent   Thought Process: Coherent and Goal Directed   Orientation: Oriented to person, place, year, and month, got date wrong.  Thought Content: Within normal limits with the exception of paranoia regarding people taking advantage of him.   Suicidal Thoughts: Patient denies suicidal ideation, intent or plans today.   Homicidal Thoughts: No   Memory: Immediate; Good  Recent:   Judgement: Good   Insight: Fair   Psychomotor Activity: Normal   Concentration:Fair   Memory: intact immediate 3/3; impaired recent 2/3   Akathisia: No   Handed: Right   AIMS (if indicated): not indicates   Assets:  Desire for Improvement  Financial Resources/Insurance  Housing  Social Support    Lab Results: No results found for this or any previous  visit (from the past 48 hour(S)).   SUMMARY AND FORMULATION:   ASSESSMENT:  Axis I: Post Traumatic Stress disorder, Panic Disorder with Agoraphobia, Obsessive Compulsive Disorder, Rule out Bipolar II Disorder  Axis II: No diagnosis.  Axis III: No diagnosis  Axis IV: Job related stressors, familial stressors.  Axis V: GAF: 40  PLAN:  1. Affirm with the patient that the medications are taken as ordered. Patient expressed understanding of how their medications were to be used.  2. Continue the following psychiatric medications as written prior to this appointment with the following changes:  A) Continue naltrexone 50 mg daily B) Will continue clonazepam 0.5 mg TID PRN C) Will continue Abilify to 10 mg daily. Will consider increase in this medication at next visit. D) Increase Lithium 900 mg will consider further increase as needed. E) Given his current severity of symptoms will continue to recommend short term disability with the possibility of long term disability. 3. Therapy: brief supportive therapy provided. Discussed psychosocial stressors. Continue individual outpatient therapy.  Continue EMDR therapy. 4. Risks and benefits, side effects and alternatives discussed with patient, he was given an opportunity to ask questions about his medication, illness, and treatment. All current  psychiatric medications have been reviewed and discussed with the patient and adjusted as clinically appropriate. The patient has been provided an accurate and updated list of the medications being now prescribed.  5. Patient told to call clinic if any problems occur. Patient advised to go to ER if he should develop SI/HI, side effects, or if symptoms worsen. Has crisis numbers to call if needed.  He has a safety plan in place. He will be with his wife.  6. Will order Lithium Level, BUN, Creatinine, TSH at next appointment. 7. The patient was encouraged to keep PCP appointment, secondary to blood pressure and start  medications as advised by PCP.  8. Patient was instructed to return to clinic in 2 weeks.  9. The patient expressed understanding of the plan outlined above and agrees with the plan.   Jacqulyn Cane, M.D.  06/14/2013 10:12 AM

## 2013-06-16 ENCOUNTER — Ambulatory Visit (HOSPITAL_COMMUNITY): Payer: Self-pay | Admitting: Psychiatry

## 2013-06-21 ENCOUNTER — Encounter (HOSPITAL_COMMUNITY): Payer: Self-pay | Admitting: Behavioral Health

## 2013-06-21 ENCOUNTER — Telehealth (HOSPITAL_COMMUNITY): Payer: Self-pay | Admitting: Behavioral Health

## 2013-06-21 ENCOUNTER — Other Ambulatory Visit (HOSPITAL_COMMUNITY): Payer: Self-pay | Admitting: Psychiatry

## 2013-06-21 ENCOUNTER — Ambulatory Visit (INDEPENDENT_AMBULATORY_CARE_PROVIDER_SITE_OTHER): Payer: 59 | Admitting: Behavioral Health

## 2013-06-21 DIAGNOSIS — F411 Generalized anxiety disorder: Secondary | ICD-10-CM

## 2013-06-21 DIAGNOSIS — F332 Major depressive disorder, recurrent severe without psychotic features: Secondary | ICD-10-CM

## 2013-06-21 DIAGNOSIS — F4310 Post-traumatic stress disorder, unspecified: Secondary | ICD-10-CM

## 2013-06-21 NOTE — Telephone Encounter (Signed)
I called the client and spoke to both he and his wife telling him that both Dr.Puthevel and I felt that it would be best for the client to attend our intensive outpatient program in our [place] office. I explained to them the purpose, the amount of time that it would take her daily session and the approximate amount of time he would be in the program. I explained to them that the client would benefit from being seen daily which I could not currently do but that I could continue to see him during IOP and will continue to see him after IOP is over. His wife felt it was a good idea. The client indicated he would do what we felt was best. I have contacted Maxcine Ham, the IOP director asking about availability in the program if possible start date. The client has [place] health care insurance. I will contact the clients wife again after speaking with the IOP director.

## 2013-06-21 NOTE — Progress Notes (Addendum)
   THERAPIST PROGRESS NOTE  Session Time: 1:00  Participation Level: Active  Behavioral Response: CasualAlertHopeless  Type of Therapy: Individual Therapy  Treatment Goals addressed: Coping  Interventions: CBT  Summary: Stephen Myers is a 52 y.o. male who presents with depression.   Suicidal/Homicidal: Nowithout intent/plan  Therapist Response: The client reported with major depression. He was tearful throughout the session. He indicated that in general over the past few weeks his depression had worsened and his suicidal thoughts have increased. He stated that the past 2 Sundays he had been suicidal and felt that he had nothing to live for. He stated that if someone had not been with him he could not have contracted for safety. He did not have a specific plan in place but did say that he has guns in the house and thought about getting one on August 16. He indicated that he realized he cannot  commit suicide because of what it would do to his family. He states that his wife and daughter check on him constantly when they're all in the house. He initially could not contract for safety with me but by the end of the session did say that he would contract for safety with me until I see him again on 06/27/2013. He and his wife remove all guns/weapons from the home.  The clients wife did contract to have someone with him around the clock. I consulted with Dr.Puthevel about getting the client into IOP in our [place] office. I did call and speak to the client and his wife about referring the client to our IOP program.  I also called the facilitator of the IOP program and she indicated that he could start the program on 06/28/2013. I will call the client and his wife back and inform him that he will be eligible to start on 06/28/2013.  The client did contract for safety until he sees me on August 25. .  Plan: Return again in 4 weeks.  Diagnosis: Axis I: 296.33    Axis II:  Deferred    [name], [facility] 06/21/2013

## 2013-06-22 ENCOUNTER — Encounter (HOSPITAL_COMMUNITY): Payer: Self-pay | Admitting: Behavioral Health

## 2013-06-27 ENCOUNTER — Ambulatory Visit (INDEPENDENT_AMBULATORY_CARE_PROVIDER_SITE_OTHER): Payer: 59 | Admitting: Behavioral Health

## 2013-06-27 ENCOUNTER — Encounter (HOSPITAL_COMMUNITY): Payer: Self-pay | Admitting: Behavioral Health

## 2013-06-27 DIAGNOSIS — F4310 Post-traumatic stress disorder, unspecified: Secondary | ICD-10-CM

## 2013-06-27 NOTE — Progress Notes (Signed)
   THERAPIST PROGRESS NOTE  Session Time: 9:00  Participation Level: Active  Behavioral Response: CasualAlertDepressed/anxious  Type of Therapy: Individual Therapy  Treatment Goals addressed: Coping  Interventions: CBT  Summary: Tron Flythe is a 52 y.o. male who presents with depression and anxiety.   Suicidal/Homicidal: Nowithout intent/plan  Therapist Response: The client indicated that the day since the previous session have been very difficult. He indicated that he has had suicidal thoughts but did not have a plan in place. He reported that his wife and daughter or another family member had been with him around the clock. He did say that yesterday (Sunday) have been better in the past 2 Sundays with client saying he had been a bit more optimistic.   The client did contract for safety with me until his next session next week. He is scheduled to begin intensive outpatient therapy on Wednesday, August 27. He indicated that he is willing to do whatever it takes to begin to fill well again. The client has been compliant with his appointments with me and is working hard on the homework that I have given him as well as in session. Today we processed more the connection between his daughters health and his anxiety and depression. We again talked about how the client can " let go" of his daughter. He indicated that she is doing much better but knows that she still has work to do. We also talked about learning to forgive ourselves. The client did contract for safety saying he will not hurt himself. I will see decline again on September 3. I instructed the client and his wife to please call me if he felt he needed to come in sooner and I would work him in. Also told his wife to please call me if she had any questions about intensive outpatient therapy. [name].   Plan: Return again in 1 weeks.  Diagnosis: Axis I: 309.81    Axis II: Deferred    [name],  [facility] 06/27/2013

## 2013-06-29 ENCOUNTER — Other Ambulatory Visit (HOSPITAL_COMMUNITY): Payer: 59 | Attending: Psychiatry | Admitting: Psychiatry

## 2013-06-29 ENCOUNTER — Encounter (HOSPITAL_COMMUNITY): Payer: Self-pay

## 2013-06-29 DIAGNOSIS — F4001 Agoraphobia with panic disorder: Secondary | ICD-10-CM | POA: Insufficient documentation

## 2013-06-29 DIAGNOSIS — F329 Major depressive disorder, single episode, unspecified: Secondary | ICD-10-CM | POA: Insufficient documentation

## 2013-06-29 DIAGNOSIS — F331 Major depressive disorder, recurrent, moderate: Secondary | ICD-10-CM

## 2013-06-29 DIAGNOSIS — F3289 Other specified depressive episodes: Secondary | ICD-10-CM | POA: Insufficient documentation
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
## 2013-06-29 NOTE — Progress Notes (Signed)
    Daily Group Progress Note  Program: IOP  Group Time: 9-10:30 am  Participation Level: Minimal  Behavioral Response: Sharing  Type of Therapy:  Process Group  Summary of Progress: The patient arrived late and was escorted by the case manager. He was attentive and when invited to share a little about himself, he admitted he was feeling very anxious. He explained he had never been in any kind of group therapy and didn't really know what it entailed. Another member assured him that she had felt the same way when she first arrived, but she was already feeling more comfortable. He reported that he was taking short term disability and some time away from work. The patient agreed that although he doesn't look ill he really is and expressed concerns about what co-workers would think if he was seen playing golf or something when he is out of work? I encouraged this new group member to focus on himself and getting what he needs to feel better and function more effectively in his life.   Group Time: 10:45-12 pm  Participation Level:  None  Behavioral Response: patient was called out by the case worker, returned, and then left to meet wtih the medical director  Type of Therapy: Psycho-education Group  Summary of Progress: patient did not get the opportunity to address this topic because he was meeting with the case worker and then with the medical director during this half of group.   Carman Ching, LCSW

## 2013-06-30 ENCOUNTER — Encounter (HOSPITAL_COMMUNITY): Payer: Self-pay | Admitting: Psychiatry

## 2013-06-30 ENCOUNTER — Other Ambulatory Visit (HOSPITAL_COMMUNITY): Payer: 59 | Admitting: Psychiatry

## 2013-06-30 ENCOUNTER — Ambulatory Visit (HOSPITAL_COMMUNITY): Payer: Self-pay | Admitting: Psychiatry

## 2013-06-30 DIAGNOSIS — F411 Generalized anxiety disorder: Secondary | ICD-10-CM

## 2013-06-30 DIAGNOSIS — F4001 Agoraphobia with panic disorder: Secondary | ICD-10-CM

## 2013-06-30 DIAGNOSIS — F331 Major depressive disorder, recurrent, moderate: Secondary | ICD-10-CM

## 2013-06-30 NOTE — Progress Notes (Signed)
    Daily Group Progress Note  Program: IOP  Group Time: 9-10:45 am  Participation Level: Active  Behavioral Response: Appropriate and Sharing  Type of Therapy:  Process Group  Summary of Progress: The patient described himself as neutral during the check-in. When asked if he was as anxious today as yesterday, he admitted that he was. It is because he has never been in group therapy before. He admitted that he had not slept well last night because he dreaded coming here. When I asked how he spent his day while he is not working, he admitted he just paces around in the house. He doesn't want to go out until people in the neighborhood have gone to work. He was able to relate to the new group member who shared about his feelings at work. This patient stated he could relate to the feelings of embarrassment when one can't do his job and how it must look to other co-workers. This patient noted he had been at his job for 26 years. Providing feedback and validation to his fellow group member indicated good progress for this patient and new group member.   Group Time: 11- 12pm  Participation Level:  Active  Behavioral Response: Sharing  Type of Therapy: Psycho-education Group  Summary of Progress: The patient wished the graduating member good luck as she leaves the group. When the presentation and handout on the 'Serenity Prayer' was passed around and completed, the patient was asked what he could not change, he identified what other people think of him. When asked what he is capable of changing, the patient identified his attitude. The patient made some good comments and responded well to this intervention.   Carman Ching, LCSW

## 2013-07-01 ENCOUNTER — Other Ambulatory Visit (HOSPITAL_COMMUNITY): Payer: 59

## 2013-07-01 NOTE — Progress Notes (Unsigned)
    Daily Group Progress Note  Program: IOP  Group Time: 0900-10:30  Participation Level: Minimal  Behavioral Response: Motivated  Type of Therapy:  Process Group  Summary of Progress:   Although pt was very attentive, this writer had to call on him in order for him to participate.  Another peer was talking about kids and mental illness and pt Stephen Myers) became very tearful.  When called upon, he mentioned that he started thinking about his daughter's struggle with drug addiction.     Group Time: 10:45-12n  Participation Level:  Minimal  Behavioral Response: Appropriate  Type of Therapy: Psycho-education Group  Summary of Progress: Discussed the importance of setting treatment goals, assessing current resources, exploring new ones, having a safety plan and an aftercare plan in place.  [facility]

## 2013-07-05 ENCOUNTER — Other Ambulatory Visit (HOSPITAL_COMMUNITY): Payer: 59 | Attending: Psychiatry | Admitting: Psychiatry

## 2013-07-05 DIAGNOSIS — F4001 Agoraphobia with panic disorder: Secondary | ICD-10-CM | POA: Insufficient documentation

## 2013-07-05 DIAGNOSIS — F3130 Bipolar disorder, current episode depressed, mild or moderate severity, unspecified: Secondary | ICD-10-CM | POA: Insufficient documentation

## 2013-07-05 DIAGNOSIS — F419 Anxiety disorder, unspecified: Secondary | ICD-10-CM

## 2013-07-05 DIAGNOSIS — F331 Major depressive disorder, recurrent, moderate: Secondary | ICD-10-CM

## 2013-07-05 NOTE — Progress Notes (Signed)
    Daily Group Progress Note  Program: IOP  Group Time: 9:00-10:30 am   Participation Level: Active  Behavioral Response: Appropriate  Type of Therapy:  Process Group  Summary of Progress: Pt stated he is experiencing high depression symptoms today and said "I struggle with Agoraphobia". Pt described having a better weekend than he expected and talked about how he was able to run errands with his wife and appeared proud of himself for going out in public. Pt received support from the group for this and for participating in the group therapy given his social anxiety. Pt became tearful when talking about his depression symptoms and how he had thoughts before joining this group that he wanted to die. Pt processed sadness at how severe his symptoms had become. Pt talked about job stress and the difficulty with interacting with others given his high anxiety levels and the feelings of "failure" associated with being unable to be successful at his job. Pt received support, challenged negative self-talk and self critial tendencies and talked about how good it felt to go outside during group today and be in the sunshine.      Group Time: 10:30 am - 12:00 pm   Participation Level:  Active  Behavioral Response: Appropriate  Type of Therapy: Psycho-education Group  Summary of Progress: Pt learned about the symptoms of clinical depression, the difference between chemical and situational depression and how to identify individual symptoms of depression to reduce the severity.   Carman Ching, LCSW

## 2013-07-06 ENCOUNTER — Ambulatory Visit (HOSPITAL_COMMUNITY): Payer: Self-pay | Admitting: Behavioral Health

## 2013-07-06 ENCOUNTER — Other Ambulatory Visit (HOSPITAL_COMMUNITY): Payer: 59 | Admitting: Psychiatry

## 2013-07-06 DIAGNOSIS — F419 Anxiety disorder, unspecified: Secondary | ICD-10-CM

## 2013-07-06 DIAGNOSIS — F331 Major depressive disorder, recurrent, moderate: Secondary | ICD-10-CM

## 2013-07-06 NOTE — Progress Notes (Signed)
Patient ID: Stephen Myers, male   DOB: 02/12/1961, 52 y.o.   MRN: [id number] A:  Per patient's request, faxed LuAnne at [facility] progress notes, group notes, along with this writer's admit note.  R:  Pt receptive.

## 2013-07-07 ENCOUNTER — Telehealth (HOSPITAL_COMMUNITY): Payer: Self-pay | Admitting: Psychiatry

## 2013-07-07 ENCOUNTER — Inpatient Hospital Stay (HOSPITAL_COMMUNITY)
Admission: AD | Admit: 2013-07-07 | Discharge: 2013-07-11 | DRG: 885 | Disposition: A | Discharge status: Home / Self Care | Payer: 59 | Attending: Psychiatry | Admitting: Psychiatry

## 2013-07-07 ENCOUNTER — Other Ambulatory Visit (HOSPITAL_COMMUNITY): Payer: 59

## 2013-07-07 ENCOUNTER — Encounter (HOSPITAL_COMMUNITY): Payer: Self-pay | Admitting: *Deleted

## 2013-07-07 DIAGNOSIS — I10 Essential (primary) hypertension: Secondary | ICD-10-CM

## 2013-07-07 DIAGNOSIS — F429 Obsessive-compulsive disorder, unspecified: Secondary | ICD-10-CM | POA: Diagnosis present

## 2013-07-07 DIAGNOSIS — Z299 Encounter for prophylactic measures, unspecified: Secondary | ICD-10-CM

## 2013-07-07 DIAGNOSIS — F4310 Post-traumatic stress disorder, unspecified: Secondary | ICD-10-CM | POA: Diagnosis present

## 2013-07-07 DIAGNOSIS — R45851 Suicidal ideations: Secondary | ICD-10-CM

## 2013-07-07 DIAGNOSIS — F332 Major depressive disorder, recurrent severe without psychotic features: Principal | ICD-10-CM | POA: Diagnosis present

## 2013-07-07 DIAGNOSIS — F4001 Agoraphobia with panic disorder: Secondary | ICD-10-CM | POA: Diagnosis present

## 2013-07-07 DIAGNOSIS — F411 Generalized anxiety disorder: Secondary | ICD-10-CM

## 2013-07-07 DIAGNOSIS — Z79899 Other long term (current) drug therapy: Secondary | ICD-10-CM

## 2013-07-07 DIAGNOSIS — F3130 Bipolar disorder, current episode depressed, mild or moderate severity, unspecified: Secondary | ICD-10-CM

## 2013-07-07 DIAGNOSIS — F331 Major depressive disorder, recurrent, moderate: Secondary | ICD-10-CM | POA: Diagnosis present

## 2013-07-07 DIAGNOSIS — F410 Panic disorder [episodic paroxysmal anxiety] without agoraphobia: Secondary | ICD-10-CM

## 2013-07-07 DIAGNOSIS — F8081 Childhood onset fluency disorder: Secondary | ICD-10-CM

## 2013-07-07 DIAGNOSIS — F419 Anxiety disorder, unspecified: Secondary | ICD-10-CM

## 2013-07-07 DIAGNOSIS — E785 Hyperlipidemia, unspecified: Secondary | ICD-10-CM

## 2013-07-07 MED ORDER — ACETAMINOPHEN 325 MG PO TABS
650.0000 mg | ORAL_TABLET | Freq: Four times a day (QID) | ORAL | Status: DC | PRN
  Administered 2013-07-10: 650 mg via ORAL

## 2013-07-07 MED ORDER — ASPIRIN EC 81 MG PO TBEC
81.0000 mg | DELAYED_RELEASE_TABLET | Freq: Every day | ORAL | Status: DC
  Administered 2013-07-08 – 2013-07-11 (×4): 81 mg via ORAL
  Filled 2013-07-07 (×5): qty 1

## 2013-07-07 MED ORDER — ATORVASTATIN CALCIUM 40 MG PO TABS
40.0000 mg | ORAL_TABLET | Freq: Every day | ORAL | Status: DC
  Administered 2013-07-07 – 2013-07-10 (×4): 40 mg via ORAL
  Filled 2013-07-07 (×5): qty 1

## 2013-07-07 MED ORDER — CLONAZEPAM 1 MG PO TABS
1.0000 mg | ORAL_TABLET | Freq: Two times a day (BID) | ORAL | Status: DC
  Administered 2013-07-07 – 2013-07-11 (×8): 1 mg via ORAL
  Filled 2013-07-07 (×8): qty 1

## 2013-07-07 MED ORDER — ARIPIPRAZOLE 10 MG PO TABS
10.0000 mg | ORAL_TABLET | Freq: Every day | ORAL | Status: DC
  Administered 2013-07-07 – 2013-07-10 (×4): 10 mg via ORAL
  Filled 2013-07-07 (×5): qty 1

## 2013-07-07 MED ORDER — ALUM & MAG HYDROXIDE-SIMETH 200-200-20 MG/5ML PO SUSP: 30.0000 mL | ORAL | Status: DC | PRN

## 2013-07-07 MED ORDER — MAGNESIUM HYDROXIDE 400 MG/5ML PO SUSP: 30.0000 mL | Freq: Every day | ORAL | Status: DC | PRN

## 2013-07-07 MED ORDER — LITHIUM CARBONATE 300 MG PO CAPS
600.0000 mg | ORAL_CAPSULE | Freq: Every day | ORAL | Status: DC
  Administered 2013-07-07 – 2013-07-10 (×4): 600 mg via ORAL
  Filled 2013-07-07 (×5): qty 2

## 2013-07-07 NOTE — Progress Notes (Signed)
Recreation Therapy Notes  Date: 09.04.2014 Time: 2:45pm Location: [address] Dayroom  Group Topic: Animal Assisted Activities (AAA)  Behavioral Response: Appropriate, Engaged, Attentive  Education: Coping Skill   Education Outcome: Acknowledges understanding  Clinical Observations/Feedback: Dog Team: Commodore & handler. Patient interacted appropriately with peer, dog team, LRT and MHT.   Crislyn Willbanks L Cebastian Neis, LRT/CTRS  Allsion Nogales L 07/07/2013 4:40 PM 

## 2013-07-07 NOTE — BHH Counselor (Signed)
Writer consulted with Ty Hilts, PA-C regarding the patient meeting inpatient criteria.  Writer was informed by the Ty Hilts that the patient is medically stable for inpatient hospitalization.  Patient signed informed consent to refuse medical screening exam form and his wife signed as a witness on this form.    Writer completed the admission support paperwork for the patient.  The adult unit has been notified that the patient will be coming to the unit.

## 2013-07-07 NOTE — Telephone Encounter (Addendum)
Placed call to patient's therapist Serafina Mitchell, [facility]).  Informed him that patient was admitted on the inpatient unit due to SI.  Requested that [facility] inform Dr. Hilton Cork.

## 2013-07-07 NOTE — Progress Notes (Addendum)
Patient's first admission to [facility].  Patient stated he was having a panic attack at home this morning, missed his scheduled group meeting in outpatient because of his panic attack.  Wife brought patient to [facility] outpatient, talked to Rita/Alan and was advised to be admitted to [facility] this morning.  Patient stated he does have SI thoughts, thoughts to hook hose to car in garage, contracts for safety now.  Denied HI.  Denied A/V hallucinations.  Denied pain.  Stated he has not been sleeping well.  Patient's 54 yr old daughter who lives with him and wife, stopped using heroin approximately one year ago.  Daughter is presently seeing therapist.  Patient's stressor also has been he has not worked since Mar 03, 2013 because he is on short term disability for agrophobia.  Was approved once for disability and then turned down once for disability, case now being appealed.  PTSD from experiences with daughter.  Patient stated he was a heavy drinker approximately 10 yrs ago, maybe drank 5 drinks of liquor daily.  Denied AA.  Patient presently drinks approximately one beer per week.  Patient denied any drug abuse.  Denied tobacco use.  Pt has BS degree, worked at [employer] 26 yrs.  Married to wife 29 yrs.  Has good relationship with wife and son.  Patient stated he walks often in his home and porch.  Use to enjoy golf/fishing in the past.  HTN and cholesterol problems.   Patient offered food/drink.  Oriented to unit.  Has been pleasant and cooperative. Fall risk assessment completed and information given to patient.  Patient's wife, Bodie Abernethy, is main contact, her cell phone is [phone].

## 2013-07-07 NOTE — [facility] Suicide Risk Assessment (Signed)
Suicide Risk Assessment  Admission Assessment     Nursing information obtained from:    Demographic factors:    Current Mental Status:    Loss Factors:    Historical Factors:    Risk Reduction Factors:     CLINICAL FACTORS:   Severe Anxiety and/or Agitation Panic Attacks Depression:   Anhedonia Hopelessness Insomnia Recent sense of peace/wellbeing Severe Obsessive-Compulsive Disorder More than one psychiatric diagnosis Unstable or Poor Therapeutic Relationship Previous Psychiatric Diagnoses and Treatments  COGNITIVE FEATURES THAT CONTRIBUTE TO RISK:  Closed-mindedness Loss of executive function Polarized thinking Thought constriction (tunnel vision)    SUICIDE RISK:   Moderate:  Frequent suicidal ideation with limited intensity, and duration, some specificity in terms of plans, no associated intent, good self-control, limited dysphoria/symptomatology, some risk factors present, and identifiable protective factors, including available and accessible social support.  PLAN OF CARE: Admitted from [facility] psych IOP for depression, anxiety and suicidal ideation with plan. Patient needs crisis stabilization and safety monitoring.   I certify that inpatient services furnished can reasonably be expected to improve the patient's condition.   Nehemiah Settle., MD 07/07/2013, 1:34 PM
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
## 2013-07-07 NOTE — Progress Notes (Signed)
[facility] Group Notes:  (Nursing/MHT/Case Management/Adjunct)  Date:  07/07/2013  Time:  2000  Type of Therapy:  Psychoeducational Skills  Participation Level:  Minimal  Participation Quality:  Resistant  Affect:  Depressed  Cognitive:  Lacking  Insight:  Limited  Engagement in Group:  Lacking  Modes of Intervention:  Education  Summary of Progress/Problems: The patient attended group but would only share that he experienced a panic attack earlier in the day. He did not share a goal with the group.   [name] 07/07/2013, 10:54 PM

## 2013-07-07 NOTE — Progress Notes (Signed)
Patient ID: Stephen Myers, male   DOB: 09-26-1961, 52 y.o.   MRN: [id number] D: Pt. Standing in hall looking around, reports he went to IOP and had a panic attack and the PA suggest he come to hospital. Pt. Reports been having SI, but currently denies and contracts for safety. Pt. Reports anxiety and depression at "7" of 10. A: Writer introduced self to client and reviewed med administration times. Writer encouraged group. Staff will monitor q35min for safety. R: Pt. Is safe on the unit. Pt. Attended group.

## 2013-07-07 NOTE — Progress Notes (Signed)
    Daily Group Progress Note  Program: IOP  Group Time: 9:00-10:30 am   Participation Level: Active  Behavioral Response: Appropriate  Type of Therapy:  Process Group  Summary of Progress: Pt reports improved mood and said "I feel more hopeful than I have in a long time". Pt was smiling, talkative, engaged and had improved affect. Pt said he is trying to take a more active role in trying to reduce his depression and anxiety and feels the support of the group is helping him.      Group Time: 10:30 am - 12:00 pm    Participation Level:  Active  Behavioral Response: Appropriate  Type of Therapy: Psycho-education Group  Summary of Progress: Pt learned about low self-esteem and how it begins and leads to behaviors later in life that impede wellness.   Carman Ching, LCSW
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
## 2013-07-07 NOTE — B[facility] Group Notes (Signed)
[facility] LCSW Group Therapy  07/07/2013  1:15 PM   Type of Therapy:  Group Therapy  Participation Level:  Did Not Attend  Reyes Ivan, LCSWA 07/07/2013 2:11 PM

## 2013-07-07 NOTE — Tx Team (Signed)
Initial Interdisciplinary Treatment Plan  PATIENT STRENGTHS: (choose at least two) Ability for insight Average or above average intelligence Capable of independent living Communication skills General fund of knowledge Motivation for treatment/growth Physical Health Special hobby/interest Supportive family/friends Work skills  PATIENT STRESSORS: Financial difficulties Marital or family conflict Occupational concerns   PROBLEM LIST: Problem List/Patient Goals Date to be addressed Date deferred Reason deferred Estimated date of resolution  Suicidal ideation 07/07/2013   D/c        depression 07/07/2013   D/c        anxiety 07/07/2013   D/c                           DISCHARGE CRITERIA:  Ability to meet basic life and health needs Improved stabilization in mood, thinking, and/or behavior Medical problems require only outpatient monitoring Motivation to continue treatment in a less acute level of care Need for constant or close observation no longer present Reduction of life-threatening or endangering symptoms to within safe limits Safe-care adequate arrangements made Verbal commitment to aftercare and medication compliance  PRELIMINARY DISCHARGE PLAN: Attend aftercare/continuing care group Attend PHP/IOP Outpatient therapy Participate in family therapy Return to previous living arrangement Return to previous work or school arrangements  PATIENT/FAMIILY INVOLVEMENT: This treatment plan has been presented to and reviewed with the patient, Stephen Myers.  The patient and family have been given the opportunity to ask questions and make suggestions.  Earline Mayotte 07/07/2013, 5:32 PM

## 2013-07-07 NOTE — BHH Suicide Risk Assessment (Signed)
[facility] INPATIENT:  Family/Significant Other Suicide Prevention Education  Suicide Prevention Education:  Education Completed; Stephen Myers, Stephen Myers, [phone];  has been identified by the patient as the family member/significant other with whom the patient will be residing, and identified as the person(s) who will aid the patient in the event of a mental health crisis (suicidal ideations/suicide attempt).  With written consent from the patient, the family member/significant other has been provided the following suicide prevention education, prior to the and/or following the discharge of the patient.  The suicide prevention education provided includes the following:  Suicide risk factors  Suicide prevention and interventions  National Suicide Hotline telephone number  [facility] assessment telephone number  [facility] Emergency Assistance 911  [facility] and/or Residential Mobile Crisis Unit telephone number  Request made of family/significant other to:  Remove weapons (e.g., guns, rifles, knives), all items previously/currently identified as safety concern.  Stephen Myers advised patient's antiques guns will be secured by his brother prior to discharge.  Remove drugs/medications (over-the-counter, prescriptions, illicit drugs), all items previously/currently identified as a safety concern.  The family member/significant other verbalizes understanding of the suicide prevention education information provided.  The family member/significant other agrees to remove the items of safety concern listed above.  Wynn Banker 07/07/2013, 1:56 PM

## 2013-07-07 NOTE — Progress Notes (Signed)
Patient ID: Stephen Myers, male   DOB: [birth year], 52 y.o.   MRN: [id number] D:  Patient arrived this morning with his wife.  He presented very tearful, stating that he was suicidal.  Denied a plan, but stated that he "just couldn't keep going on like this."  Wife states that she is very fearful that he might harm himself.  Continues to struggle with depression and daily panic attacks.   A:  Discussed going inpatient with patient.  Had Jorje Guild, PA-C to meet with patient.  Patient agreed to go inpt.  Hessie Diener spoke with the assessment department upstairs.  R:  Pt receptive.

## 2013-07-07 NOTE — Progress Notes (Unsigned)
Patient ID: Stephen Myers, male   DOB: 1961-03-08, 52 y.o.   MRN: [id number]   Subjective: [name] presented for IOP this morning, accompanied by his wife who drove him to this facility. He endorses that he is having a full-blown panic attack this morning, and has been having suicidal thoughts. He has had thoughts of going into his garage and closing the garage door and letting his vehicle run until he becomes asphyxiated by carbon monoxide. The closer he got to the garage this morning, the more anxious he became.  At that time he asked his wife to drive him to behavioral health. He denies any homicidal thoughts or auditory or visual hallucinations. His wife reports that she had him take a Klonopin this morning, which he does not normally do. He endorses that it is helping somewhat, but he is still endorsing extreme anxiety and depression.   Objective: Well-nourished and well-developed, fully alert and oriented, extremely depressed and anxious with a tearful affect. Cognitive functioning seems slightly delayed. His memory and concentration are intact. His speech is clear and coherent albeit low in volume. His motor function is within normal limits.   Assessment and Plan: Increasing anxiety and depression with suicidal ideation. Will move forward with voluntary admission to behavioral health hospital for more intensive treatment.  Yolande Jolly MHS, PA-C   Addendum: The patient and his wife agree that an inpatient hospitalization would be beneficial. [facility] assessment office and agreed to take the patient without medical clearance due to his current admission in IOP and being under the care of this provider. Patient and his wife were escorted to the inpatient unit and turned over to assessment and inpatient staff

## 2013-07-08 ENCOUNTER — Other Ambulatory Visit (HOSPITAL_COMMUNITY): Payer: 59

## 2013-07-08 DIAGNOSIS — F411 Generalized anxiety disorder: Secondary | ICD-10-CM

## 2013-07-08 LAB — CBC WITH DIFFERENTIAL/PLATELET
Eosinophils Absolute: 0.1 10*3/uL (ref 0.0–0.7)
Eosinophils Relative: 2 % (ref 0–5)
HCT: 42.9 % (ref 39.0–52.0)
Lymphocytes Relative: 29 % (ref 12–46)
Lymphs Abs: 2.5 10*3/uL (ref 0.7–4.0)
MCH: 31.8 pg (ref 26.0–34.0)
MCV: 92.3 fL (ref 78.0–100.0)
Monocytes Absolute: 0.5 10*3/uL (ref 0.1–1.0)
Platelets: 211 10*3/uL (ref 150–400)
RBC: 4.65 MIL/uL (ref 4.22–5.81)
RDW: 12.8 % (ref 11.5–15.5)

## 2013-07-08 LAB — COMPREHENSIVE METABOLIC PANEL
ALT: 24 U/L (ref 0–53)
BUN: 16 mg/dL (ref 6–23)
CO2: 28 mEq/L (ref 19–32)
Calcium: 9.4 mg/dL (ref 8.4–10.5)
Creatinine, Ser: 1.04 mg/dL (ref 0.50–1.35)
GFR calc Af Amer: >90 mL/min (ref >90)
GFR calc non Af Amer: 80 mL/min — ABNORMAL LOW (ref >90)
Glucose, Bld: 104 mg/dL — ABNORMAL HIGH (ref 70–99)
Sodium: 141 mEq/L (ref 135–145)
Total Protein: 7.4 g/dL (ref 6.0–8.3)

## 2013-07-08 LAB — LIPID PANEL
Cholesterol: 117 mg/dL (ref 0–200)
Total CHOL/HDL Ratio: 2.7 RATIO
Triglycerides: 108 mg/dL (ref <150)
VLDL: 22 mg/dL (ref 0–40)

## 2013-07-08 LAB — URINALYSIS, DIPSTICK ONLY
Bilirubin Urine: NEGATIVE
Hgb urine dipstick: NEGATIVE
Ketones, ur: NEGATIVE mg/dL
Nitrite: NEGATIVE
Protein, ur: NEGATIVE mg/dL
Specific Gravity, Urine: 1.023 (ref 1.005–1.030)
Urobilinogen, UA: 0.2 mg/dL (ref 0.0–1.0)

## 2013-07-08 LAB — RAPID URINE DRUG SCREEN, HOSP PERFORMED
Amphetamines: NOT DETECTED
Benzodiazepines: POSITIVE — AB
Cocaine: NOT DETECTED
Opiates: NOT DETECTED

## 2013-07-08 NOTE — Progress Notes (Signed)
[facility] Group Notes:  (Nursing/MHT/Case Management/Adjunct)  Date:  07/08/2013  Time:  2000  Type of Therapy:  Psychoeducational Skills  Participation Level:  Active  Participation Quality:  Attentive  Affect:  Depressed  Cognitive:  Appropriate  Insight:  Improving  Engagement in Group:  Improving  Modes of Intervention:  Education  Summary of Progress/Problems: The patient was more responsive in group this evening. He stated that he had a "good day". The patient shared with the group that he was proud of the fact that he laughed for the first time since being admitted to the unit. In addition, he mentioned that he was feeling better overall. His goal for tomorrow is to "improve on today".   [name] 07/08/2013, 11:38 PM

## 2013-07-08 NOTE — Progress Notes (Signed)
Patient ID: Stephen Myers, male   DOB: 08/11/1961, 52 y.o.   MRN: [id number] D: Patient in dayroom on approach. Pt mood appeared depressed and anxious. Pt rated depression and anxiety as 6 on a 0 -10 scale. Pt denies SI/HI/AVH and pain. Pt attended evening wrap up group and engaged in discussion. Pt is observed in the dayroom interacting with peers. Pt denies any needs or concerns.  Cooperative with assessment. No acute distressed noted at this time.   A: Met with pt 1:1. Medications administered as prescribed. Writer encouraged pt to discuss feelings. Pt encouraged to come to staff with any question or concerns. 15 minutes checks for safety.  R: Patient remains safe. He is complaint with medications and denies any adverse reaction. Continue current POC.

## 2013-07-08 NOTE — [facility] Group Notes (Signed)
[facility] LCSW Aftercare Discharge Planning Group Note   07/08/2013 10:26 AM    Participation Quality:  Appropraite  Mood/Affect:  Appropriate  Depression Rating:  6  Anxiety Rating:  6  Thoughts of Suicide:  No  Will you contract for safety?   NA  Current AVH:  No  Plan for Discharge/Comments:  Patient attending discharge planning group and actively participated in group.  He reports feeling better today.  Patient plans to return to [facility]-I[facility] at discharge.  CSW provided all participants with daily workbook and information on services offered by [facility].   Transportation Means: Patient has transportation.   Supports:  Patient has a support system.   Stephen Myers, Joesph July

## 2013-07-08 NOTE — B[facility] Group Notes (Signed)
[facility] LCSW Group Therapy  Feelings Around Relapse 1:15 -2:30        07/08/2013  2:54 PM   Type of Therapy:  Group Therapy  Participation Level:  Appropriate  Participation Quality:  Appropriate  Affect:  Appropriate, De[ressed  Cognitive:  Attentive Appropriate  Insight:  Engaged  Engagement in Therapy:  Engaged  Modes of Intervention:  Discussion Exploration Problem-Solving Supportive  Summary of Progress/Problems:  Patient discussed feelings around relapse.  He shared he has to work to get out of his house due to the agoraphobia.   Patient was able to identify warning signs and appropriate coping skills to prevention relapse.   Wynn Banker 07/08/2013  2:54 PM

## 2013-07-08 NOTE — Progress Notes (Signed)
D) Pt has been attending the groups and interacting with his peers in the dayroom. Affect and mood are appropriate. Denies SI and HI. Rates his depression and his hopelessness both at a 7. States that he slept very well last night and "that makes all the difference".  A) Encouraged to go to the groups and given support. R) States overall he is having a much better day. Affect brighter.

## 2013-07-08 NOTE — Tx Team (Signed)
Interdisciplinary Treatment Plan Update   Date Reviewed:  07/08/2013  Time Reviewed:  9:40 AM  Progress in Treatment:   Attending groups: Yes Participating in groups: Yes Taking medication as prescribed: Yes  Tolerating medication: Yes Family/Significant other contact made: Yes, contact made with wife.  Patient understands diagnosis: Yes  Discussing patient identified problems/goals with staff: Yes Medical problems stabilized or resolved: Yes Denies suicidal/homicidal ideation: Yes Patient has not harmed self or others: Yes  For review of initial/current patient goals, please see plan of care.  Estimated Length of Stay:  3-5 days  Reasons for Continued Hospitalization:  Anxiety Depression Medication stabilization  New Problems/Goals identified:    Discharge Plan or Barriers:   Home with outpatient follow up with MH-IOP  Additional Comments:  Admitted with depression and SI with plan.  Attendees:  Patient:  07/08/2013 9:40 AM   Signature: Mervyn Gay, MD 07/08/2013 9:40 AM  Signature:  Verne Spurr, PA 07/08/2013 9:40 AM  Signature: Lamount Cranker, RN 07/08/2013 9:40 AM  Signature:  Skipper Cliche, RN 07/08/2013 9:40 AM  Signature:  Neill Loft RN 07/08/2013 9:40 AM  Signature:  Juline Patch, LCSW 07/08/2013 9:40 AM  Signature:  Reyes Ivan, LCSW 07/08/2013 9:40 AM  Signature:  Maseta Dorley,Care Coordinator 07/08/2013 9:40 AM  Signature:  07/08/2013 9:40 AM  Signature:  07/08/2013  9:40 AM  Signature:    Signature:      Scribe for Treatment Team:   Juline Patch,  07/08/2013 9:40 AM

## 2013-07-08 NOTE — Progress Notes (Signed)
[facility] MD Progress Note  07/08/2013 11:47 AM Stephen Myers  MRN:  [id number]  Subjective:  Patient is up and active in the unit milieu. He reports he feels much better today and that his depression is decreasing. He rates it as a 5-6/10. He reports that he knows his depression is better as he is not focused on harming himself as he was yesterday. He describes it as a "veil being lifted off, or emerging from the darkness." His anxiety is a bit higher at 6/10. Reporting that this is better because he can focus more clearly and has actually enjoyed laughing with some of the other patients. He discloses that his biggest stressor is his 54 year old daughter who lives at home with him and his wife has a history of heroin abuse. Now she is currently enrolled at [facility] but has been known to take his medication if he ever fails to lock it up.  Diagnosis:   DSM5: Schizophrenia Disorders:   Obsessive-Compulsive Disorders:   Trauma-Stressor Disorders:  Posttraumatic Stress Disorder (309.81) Substance/Addictive Disorders:   Depressive Disorders:  Major Depressive Disorder - Severe (296.23) AXIS I: Bipolar, Depressed, Major Depression, Recurrent severe, Panic Disorder and Post Traumatic Stress Disorder  AXIS II: Deferred  AXIS III:  Past Medical History   Diagnosis  Date   .  Irritability and anger      symptoms at home and work   .  Anxiety    .  Depression     AXIS IV: occupational problems, other psychosocial or environmental problems and problems related to social environment  AXIS V: 41-50 serious symptoms    ADL's:  Intact  Sleep: Poor  Appetite:  Fair  Suicidal Ideation:  Positive for thoughts, but no plan, no intent Homicidal Ideation:  denies AEB (as evidenced by):  Psychiatric Specialty Exam: Review of Systems  Constitutional: Negative.  Negative for fever, chills, weight loss, malaise/fatigue and diaphoresis.  HENT: Negative for congestion and sore throat.   Eyes: Negative for  blurred vision, double vision and photophobia.  Respiratory: Negative for cough, shortness of breath and wheezing.   Cardiovascular: Negative for chest pain, palpitations and PND.  Gastrointestinal: Negative for heartburn, nausea, vomiting, abdominal pain, diarrhea and constipation.  Musculoskeletal: Negative for myalgias, joint pain and falls.  Neurological: Negative for dizziness, tingling, tremors, sensory change, speech change, focal weakness, seizures, loss of consciousness, weakness and headaches.  Endo/Heme/Allergies: Negative for polydipsia. Does not bruise/bleed easily.  Psychiatric/Behavioral: Negative for depression, suicidal ideas, hallucinations, memory loss and substance abuse. The patient is not nervous/anxious and does not have insomnia.     Blood pressure 137/80, pulse 109, temperature 97.8 F (36.6 C), temperature source Oral, resp. rate 20, height 6\' 3"  (1.905 m), weight 128.822 kg (284 lb).Body mass index is 35.5 kg/(m^2).  General Appearance: Casual  Eye Contact::  Fair  Speech:  Clear and Coherent  Volume:  Normal  Mood:  Anxious and Depressed  Affect:  Congruent  Thought Process:  Goal Directed  Orientation:  Full (Time, Place, and Person)  Thought Content:  WDL  Suicidal Thoughts:  Yes.  without intent/plan  Homicidal Thoughts:  No  Memory:  Immediate;   Fair  Judgement:  Intact  Insight:  Present  Psychomotor Activity:  Normal  Concentration:  Fair  Recall:  Fair  Akathisia:  No  Handed:  Right  AIMS (if indicated):     Assets:  Communication Skills Desire for Improvement Financial Resources/Insurance Housing Physical Health Resilience Social Support  Sleep:      Current Medications: Current Facility-Administered Medications  Medication Dose Route Frequency Provider Last Rate Last Dose  . acetaminophen (TYLENOL) tablet 650 mg  650 mg Oral Q6H PRN Nehemiah Settle, MD      . alum & mag hydroxide-simeth (MAALOX/MYLANTA) 200-200-20 MG/5ML  suspension 30 mL  30 mL Oral Q4H PRN Nehemiah Settle, MD      . ARIPiprazole (ABILIFY) tablet 10 mg  10 mg Oral QHS Nehemiah Settle, MD   10 mg at 07/07/13 2123  . aspirin EC tablet 81 mg  81 mg Oral Daily Nehemiah Settle, MD   81 mg at 07/08/13 0749  . atorvastatin (LIPITOR) tablet 40 mg  40 mg Oral q1800 Nehemiah Settle, MD   40 mg at 07/07/13 1809  . clonazePAM (KLONOPIN) tablet 1 mg  1 mg Oral BID Nehemiah Settle, MD   1 mg at 07/08/13 0749  . lithium carbonate capsule 600 mg  600 mg Oral QHS Nehemiah Settle, MD   600 mg at 07/07/13 2123  . magnesium hydroxide (MILK OF MAGNESIA) suspension 30 mL  30 mL Oral Daily PRN Nehemiah Settle, MD        Lab Results:  Results for orders placed during the hospital encounter of 07/07/13 (from the past 48 hour(s))  LIPID PANEL     Status: None   Collection Time    07/08/13  6:10 AM      Result Value Range   Cholesterol 117  0 - 200 mg/dL   Triglycerides 161  <096 mg/dL   HDL 43  >04 mg/dL   Total CHOL/HDL Ratio 2.7     VLDL 22  0 - 40 mg/dL   LDL Cholesterol 52  0 - 99 mg/dL   Comment:            Total Cholesterol/HDL:CHD Risk     Coronary Heart Disease Risk Table                         Men   Women      1/2 Average Risk   3.4   3.3      Average Risk       5.0   4.4      2 X Average Risk   9.6   7.1      3 X Average Risk  23.4   11.0                Use the calculated Patient Ratio     above and the CHD Risk Table     to determine the patient's CHD Risk.                ATP III CLASSIFICATION (LDL):      <100     mg/dL   Optimal      540-981  mg/dL   Near or Above                        Optimal      130-159  mg/dL   Borderline      191-478  mg/dL   High      >295     mg/dL   Very High     Performed at [facility]  CBC WITH DIFFERENTIAL     Status: None   Collection Time    07/08/13  6:10 AM  Result Value Range   WBC 8.7  4.0 - 10.5 K/uL   RBC 4.65   4.22 - 5.81 MIL/uL   Hemoglobin 14.8  13.0 - 17.0 g/dL   HCT 16.1  09.6 - 04.5 %   MCV 92.3  78.0 - 100.0 fL   MCH 31.8  26.0 - 34.0 pg   MCHC 34.5  30.0 - 36.0 g/dL   RDW 40.9  81.1 - 91.4 %   Platelets 211  150 - 400 K/uL   Neutrophils Relative % 64  43 - 77 %   Neutro Abs 5.5  1.7 - 7.7 K/uL   Lymphocytes Relative 29  12 - 46 %   Lymphs Abs 2.5  0.7 - 4.0 K/uL   Monocytes Relative 5  3 - 12 %   Monocytes Absolute 0.5  0.1 - 1.0 K/uL   Eosinophils Relative 2  0 - 5 %   Eosinophils Absolute 0.1  0.0 - 0.7 K/uL   Basophils Relative 0  0 - 1 %   Basophils Absolute 0.0  0.0 - 0.1 K/uL   Comment: Performed at [facility]  COMPREHENSIVE METABOLIC PANEL     Status: Abnormal   Collection Time    07/08/13  6:10 AM      Result Value Range   Sodium 141  135 - 145 mEq/L   Potassium 3.5  3.5 - 5.1 mEq/L   Chloride 102  96 - 112 mEq/L   CO2 28  19 - 32 mEq/L   Glucose, Bld 104 (*) 70 - 99 mg/dL   BUN 16  6 - 23 mg/dL   Creatinine, Ser 7.82  0.50 - 1.35 mg/dL   Calcium 9.4  8.4 - 95.6 mg/dL   Total Protein 7.4  6.0 - 8.3 g/dL   Albumin 4.2  3.5 - 5.2 g/dL   AST 20  0 - 37 U/L   ALT 24  0 - 53 U/L   Alkaline Phosphatase 71  39 - 117 U/L   Total Bilirubin 0.9  0.3 - 1.2 mg/dL   GFR calc non Af Amer 80 (*) >90 mL/min   GFR calc Af Amer >90  >90 mL/min   Comment: (NOTE)     The eGFR has been calculated using the CKD EPI equation.     This calculation has not been validated in all clinical situations.     eGFR's persistently <90 mL/min signify possible Chronic Kidney     Disease.     Performed at [facility]  URINE RAPID DRUG SCREEN (HOSP PERFORMED)     Status: Abnormal   Collection Time    07/08/13  6:15 AM      Result Value Range   Opiates NONE DETECTED  NONE DETECTED   Cocaine NONE DETECTED  NONE DETECTED   Benzodiazepines POSITIVE (*) NONE DETECTED   Amphetamines NONE DETECTED  NONE DETECTED   Tetrahydrocannabinol NONE DETECTED  NONE  DETECTED   Barbiturates NONE DETECTED  NONE DETECTED   Comment:            DRUG SCREEN FOR MEDICAL PURPOSES     ONLY.  IF CONFIRMATION IS NEEDED     FOR ANY PURPOSE, NOTIFY LAB     WITHIN 5 DAYS.                LOWEST DETECTABLE LIMITS     FOR URINE DRUG SCREEN     Drug Class       Cutoff (ng/mL)  Amphetamine      1000     Barbiturate      200     Benzodiazepine   200     Tricyclics       300     Opiates          300     Cocaine          300     THC              50     Performed at [facility]  URINALYSIS, DIPSTICK ONLY     Status: None   Collection Time    07/08/13  6:15 AM      Result Value Range   Specific Gravity, Urine 1.023  1.005 - 1.030   pH 5.5  5.0 - 8.0   Glucose, UA NEGATIVE  NEGATIVE mg/dL   Hgb urine dipstick NEGATIVE  NEGATIVE   Bilirubin Urine NEGATIVE  NEGATIVE   Ketones, ur NEGATIVE  NEGATIVE mg/dL   Protein, ur NEGATIVE  NEGATIVE mg/dL   Urobilinogen, UA 0.2  0.0 - 1.0 mg/dL   Nitrite NEGATIVE  NEGATIVE   Leukocytes, UA NEGATIVE  NEGATIVE   Comment: Performed at [facility]    Physical Findings: AIMS: Facial and Oral Movements Muscles of Facial Expression: None, normal Lips and Perioral Area: None, normal Jaw: None, normal Tongue: None, normal,Extremity Movements Upper (arms, wrists, hands, fingers): None, normal Lower (legs, knees, ankles, toes): None, normal, Trunk Movements Neck, shoulders, hips: None, normal, Overall Severity Severity of abnormal movements (highest score from questions above): None, normal Incapacitation due to abnormal movements: None, normal Patient's awareness of abnormal movements (rate only patient's report): No Awareness, Dental Status Current problems with teeth and/or dentures?: No Does patient usually wear dentures?: No  CIWA:  CIWA-Ar Total: 5 COWS:  COWS Total Score: 5  Treatment Plan Summary: Daily contact with patient to assess and evaluate symptoms and progress in  treatment Medication management  Plan: . Continue crisis management and stabilization. 2. Medication management to reduce current symptoms to base line and improve patient's overall level of functioning 3. Treat health problems as indicated. 4. Develop treatment plan to decrease risk of relapse upon discharge and the need for readmission. 5. Psycho-social education regarding relapse prevention and self care. 6. Health care follow up as needed for medical problems. 7. Continue home medications where appropriate. 8. Medications are reviewed and no changes at this time. 9. ELOS: 2-3 days  10. Also recommended Al-Anon for a possible resource for him upon discharge to help him develop coping skills for his daughter's addiction.  Medical Decision Making Problem Points:  Established problem, stable/improving (1) and Review of psycho-social stressors (1) Data Points:  Review or order clinical lab tests (1)  I certify that inpatient services furnished can reasonably be expected to improve the patient's condition.  Rona Ravens. Mashburn RPAC 3:26 PM 07/08/2013  Reviewed the information documented and agree with the treatment plan.  Amellia Panik,JANARDHAHA R. 07/08/2013 6:11 PM

## 2013-07-09 MED ORDER — TRAZODONE HCL 50 MG PO TABS
50.0000 mg | ORAL_TABLET | Freq: Every day | ORAL | Status: DC
  Administered 2013-07-09 – 2013-07-10 (×2): 50 mg via ORAL
  Filled 2013-07-09 (×4): qty 1

## 2013-07-09 NOTE — [facility] Group Notes (Signed)
[facility] Group Notes:  (Clinical Social Work)  07/09/2013   3:00-4:00PM  Summary of Progress/Problems:   The main focus of today's process group was for the patient to identify ways in which they have sabotaged their own mental health wellness/recovery.  Motivational interviewing was used to explore the reasons they engage in this behavior, and reasons they may have for wanting to change.  The Stages of Change were explained to the group using a handout, and patients identified where they are with regard to changing self-defeating behaviors.  The patient expressed that he self sabotages with salty snacks despite his high blood pressure.  He feels he is in Preparation stage headed for action.  Type of Therapy:  Process Group  Participation Level:  Active  Participation Quality:  Attentive  Affect:  Blunted  Cognitive:  Oriented  Insight:  Engaged  Engagement in Therapy:  Engaged  Modes of Intervention:  Education, Motivational Interviewing   Ambrose Mantle, LCSW 07/09/2013, 5:04 PM

## 2013-07-09 NOTE — Progress Notes (Signed)
[facility] MD Progress Note  07/09/2013 4:44 PM Stephen Myers  MRN:  [id number] Subjective:  Patient stated his sleep was not good, Trazodone added PRN, but he was tired today and took a nap.  Appetite is "good", depression better 3-4/10 depression, 5/10 anxiety, denies suicidal/homicidal ideations and hallucinations, no panic attacks.  Good support system at home with his wife and his 54 year-old daughter.  Feels the Lithium is working for him but making him feel tired.  Diagnosis:   DSM5:  Depressive Disorders:  Major Depressive Disorder - Severe (296.23)  Axis I: Anxiety Disorder NOS and Major Depression, Recurrent severe Axis II: Deferred Axis III:  Past Medical History  Diagnosis Date  . Irritability and anger     symptoms at home and work  . Anxiety   . Depression    Axis IV: other psychosocial or environmental problems, problems related to social environment and problems with primary support group Axis V: 41-50 serious symptoms  ADL's:  Intact  Sleep: Poor  Appetite:  Good  Suicidal Ideation:  Denies Homicidal Ideation:  Denies   Psychiatric Specialty Exam: Review of Systems  Constitutional: Negative.   HENT: Negative.   Eyes: Negative.   Respiratory: Negative.   Cardiovascular: Negative.   Gastrointestinal: Negative.   Genitourinary: Negative.   Musculoskeletal: Negative.   Skin: Negative.   Neurological: Negative.   Endo/Heme/Allergies: Negative.   Psychiatric/Behavioral: Positive for depression. The patient is nervous/anxious.     Blood pressure 126/88, pulse 102, temperature 97.5 F (36.4 C), temperature source Oral, resp. rate 18, height 6\' 3"  (1.905 m), weight 128.822 kg (284 lb).Body mass index is 35.5 kg/(m^2).  General Appearance: Casual  Eye Contact::  Good  Speech:  Normal Rate  Volume:  Normal  Mood:  Anxious and Depressed  Affect:  Depressed  Thought Process:  Coherent  Orientation:  Full (Time, Place, and Person)  Thought Content:  WDL   Suicidal Thoughts:  No  Homicidal Thoughts:  No  Memory:  Immediate;   Fair Recent;   Fair Remote;   Fair  Judgement:  Fair  Insight:  Fair  Psychomotor Activity:  Decreased  Concentration:  Fair  Recall:  Fair  Akathisia:  No  Handed:  Right  AIMS (if indicated):     Assets:  Communication Skills Resilience Social Support  Sleep:  Number of Hours: 6.75   Current Medications: Current Facility-Administered Medications  Medication Dose Route Frequency Provider Last Rate Last Dose  . acetaminophen (TYLENOL) tablet 650 mg  650 mg Oral Q6H PRN Nehemiah Settle, MD      . alum & mag hydroxide-simeth (MAALOX/MYLANTA) 200-200-20 MG/5ML suspension 30 mL  30 mL Oral Q4H PRN Nehemiah Settle, MD      . ARIPiprazole (ABILIFY) tablet 10 mg  10 mg Oral QHS Nehemiah Settle, MD   10 mg at 07/08/13 2159  . aspirin EC tablet 81 mg  81 mg Oral Daily Nehemiah Settle, MD   81 mg at 07/09/13 0806  . atorvastatin (LIPITOR) tablet 40 mg  40 mg Oral q1800 Nehemiah Settle, MD   40 mg at 07/08/13 1708  . clonazePAM (KLONOPIN) tablet 1 mg  1 mg Oral BID Nehemiah Settle, MD   1 mg at 07/09/13 0806  . lithium carbonate capsule 600 mg  600 mg Oral QHS Nehemiah Settle, MD   600 mg at 07/08/13 2159  . magnesium hydroxide (MILK OF MAGNESIA) suspension 30 mL  30 mL Oral Daily PRN  Nehemiah Settle, MD      . traZODone (DESYREL) tablet 50 mg  50 mg Oral QHS Nanine Means, NP        Lab Results:  Results for orders placed during the hospital encounter of 07/07/13 (from the past 48 hour(s))  LIPID PANEL     Status: None   Collection Time    07/08/13  6:10 AM      Result Value Range   Cholesterol 117  0 - 200 mg/dL   Triglycerides 782  <956 mg/dL   HDL 43  >21 mg/dL   Total CHOL/HDL Ratio 2.7     VLDL 22  0 - 40 mg/dL   LDL Cholesterol 52  0 - 99 mg/dL   Comment:            Total Cholesterol/HDL:CHD Risk     Coronary Heart Disease  Risk Table                         Men   Women      1/2 Average Risk   3.4   3.3      Average Risk       5.0   4.4      2 X Average Risk   9.6   7.1      3 X Average Risk  23.4   11.0                Use the calculated Patient Ratio     above and the CHD Risk Table     to determine the patient's CHD Risk.                ATP III CLASSIFICATION (LDL):      <100     mg/dL   Optimal      308-657  mg/dL   Near or Above                        Optimal      130-159  mg/dL   Borderline      846-962  mg/dL   High      >952     mg/dL   Very High     Performed at [facility]  TSH     Status: None   Collection Time    07/08/13  6:10 AM      Result Value Range   TSH 2.454  0.350 - 4.500 uIU/mL   Comment: Performed at [facility]  CBC WITH DIFFERENTIAL     Status: None   Collection Time    07/08/13  6:10 AM      Result Value Range   WBC 8.7  4.0 - 10.5 K/uL   RBC 4.65  4.22 - 5.81 MIL/uL   Hemoglobin 14.8  13.0 - 17.0 g/dL   HCT 84.1  32.4 - 40.1 %   MCV 92.3  78.0 - 100.0 fL   MCH 31.8  26.0 - 34.0 pg   MCHC 34.5  30.0 - 36.0 g/dL   RDW 02.7  25.3 - 66.4 %   Platelets 211  150 - 400 K/uL   Neutrophils Relative % 64  43 - 77 %   Neutro Abs 5.5  1.7 - 7.7 K/uL   Lymphocytes Relative 29  12 - 46 %   Lymphs Abs 2.5  0.7 - 4.0 K/uL   Monocytes Relative 5  3 -  12 %   Monocytes Absolute 0.5  0.1 - 1.0 K/uL   Eosinophils Relative 2  0 - 5 %   Eosinophils Absolute 0.1  0.0 - 0.7 K/uL   Basophils Relative 0  0 - 1 %   Basophils Absolute 0.0  0.0 - 0.1 K/uL   Comment: Performed at [facility]  COMPREHENSIVE METABOLIC PANEL     Status: Abnormal   Collection Time    07/08/13  6:10 AM      Result Value Range   Sodium 141  135 - 145 mEq/L   Potassium 3.5  3.5 - 5.1 mEq/L   Chloride 102  96 - 112 mEq/L   CO2 28  19 - 32 mEq/L   Glucose, Bld 104 (*) 70 - 99 mg/dL   BUN 16  6 - 23 mg/dL   Creatinine, Ser 1.61  0.50 - 1.35 mg/dL   Calcium 9.4  8.4 - 09.6  mg/dL   Total Protein 7.4  6.0 - 8.3 g/dL   Albumin 4.2  3.5 - 5.2 g/dL   AST 20  0 - 37 U/L   ALT 24  0 - 53 U/L   Alkaline Phosphatase 71  39 - 117 U/L   Total Bilirubin 0.9  0.3 - 1.2 mg/dL   GFR calc non Af Amer 80 (*) >90 mL/min   GFR calc Af Amer >90  >90 mL/min   Comment: (NOTE)     The eGFR has been calculated using the CKD EPI equation.     This calculation has not been validated in all clinical situations.     eGFR's persistently <90 mL/min signify possible Chronic Kidney     Disease.     Performed at [facility]  URINE RAPID DRUG SCREEN (HOSP PERFORMED)     Status: Abnormal   Collection Time    07/08/13  6:15 AM      Result Value Range   Opiates NONE DETECTED  NONE DETECTED   Cocaine NONE DETECTED  NONE DETECTED   Benzodiazepines POSITIVE (*) NONE DETECTED   Amphetamines NONE DETECTED  NONE DETECTED   Tetrahydrocannabinol NONE DETECTED  NONE DETECTED   Barbiturates NONE DETECTED  NONE DETECTED   Comment:            DRUG SCREEN FOR MEDICAL PURPOSES     ONLY.  IF CONFIRMATION IS NEEDED     FOR ANY PURPOSE, NOTIFY LAB     WITHIN 5 DAYS.                LOWEST DETECTABLE LIMITS     FOR URINE DRUG SCREEN     Drug Class       Cutoff (ng/mL)     Amphetamine      1000     Barbiturate      200     Benzodiazepine   200     Tricyclics       300     Opiates          300     Cocaine          300     THC              50     Performed at [facility]  URINALYSIS, DIPSTICK ONLY     Status: None   Collection Time    07/08/13  6:15 AM      Result Value Range   Specific Gravity,  Urine 1.023  1.005 - 1.030   pH 5.5  5.0 - 8.0   Glucose, UA NEGATIVE  NEGATIVE mg/dL   Hgb urine dipstick NEGATIVE  NEGATIVE   Bilirubin Urine NEGATIVE  NEGATIVE   Ketones, ur NEGATIVE  NEGATIVE mg/dL   Protein, ur NEGATIVE  NEGATIVE mg/dL   Urobilinogen, UA 0.2  0.0 - 1.0 mg/dL   Nitrite NEGATIVE  NEGATIVE   Leukocytes, UA NEGATIVE  NEGATIVE   Comment:  Performed at [facility]    Physical Findings: AIMS: Facial and Oral Movements Muscles of Facial Expression: None, normal Lips and Perioral Area: None, normal Jaw: None, normal Tongue: None, normal,Extremity Movements Upper (arms, wrists, hands, fingers): None, normal Lower (legs, knees, ankles, toes): None, normal, Trunk Movements Neck, shoulders, hips: None, normal, Overall Severity Severity of abnormal movements (highest score from questions above): None, normal Incapacitation due to abnormal movements: None, normal Patient's awareness of abnormal movements (rate only patient's report): No Awareness, Dental Status Current problems with teeth and/or dentures?: No Does patient usually wear dentures?: No  CIWA:  CIWA-Ar Total: 5 COWS:  COWS Total Score: 5  Treatment Plan Summary: Daily contact with patient to assess and evaluate symptoms and progress in treatment Medication management  Plan:  Review of chart, vital signs, medications, and notes. 1-Individual and group therapy 2-Medication management for depression and anxiety:  Medications reviewed with the patient and Trazodone added for sleep issues 3-Coping skills for depression, anxiety, 4-Continue crisis stabilization and management 5-Address health issues--monitoring vital signs, stable 6-Treatment plan in progress to prevent relapse of depression and anxiety  Medical Decision Making Problem Points:  Established problem, stable/improving (1) and Review of psycho-social stressors (1) Data Points:  Review of medication regiment & side effects (2)  I certify that inpatient services furnished can reasonably be expected to improve the patient's condition.   Nanine Means, PMH-NP 07/09/2013, 4:44 PM  I agree with the findings and treatment plan of this patient.

## 2013-07-09 NOTE — Progress Notes (Signed)
.  Psychoeducational Group Note    Date: 07/09/2013 Time:  0930  Goal Setting Purpose of Group: To be able to set a goal that is measurable and that can be accomplished in one day Participation Level:  Active  Participation Quality:  Appropriate  Affect:  Appropriate  Cognitive:  Alert  Insight:  Improving  Engagement in Group:  Engaged  Additional Comments:    Stephen Myers A 

## 2013-07-09 NOTE — Progress Notes (Signed)
Patient was observed lying in his bed resting and was easily aroused when his name was called.  Writer spoke with patient and he reports having had a good day. Writer informed patient of his scheduled meds due at 2200 and is complaint with meds ordered. Patient denies si/hi/a/v hallucinations. Support and encouragement offered, safety maintained on unit with 15 min checks. Will continue to monitor.

## 2013-07-09 NOTE — Progress Notes (Signed)
Psychoeducational Group Note  Date: 07/09/2013 Time:  1015  Group Topic/Focus:  Identifying Needs:   The focus of this group is to help patients identify their personal needs that have been historically problematic and identify healthy behaviors to address their needs.  Participation Level: Active  Participation Quality:  Appropriate  Affect:  Appropriate  Cognitive:  Appropriate  Insight:  Improving  Engagement in Group:  Engaged  Additional Comments:    Dione Housekeeper

## 2013-07-10 MED ORDER — LOSARTAN POTASSIUM 50 MG PO TABS
50.0000 mg | ORAL_TABLET | Freq: Every day | ORAL | Status: DC
  Administered 2013-07-10 – 2013-07-11 (×2): 50 mg via ORAL
  Filled 2013-07-10 (×2): qty 1

## 2013-07-10 NOTE — Progress Notes (Signed)
Patient ID: Stephen Myers, male   DOB: April 17, 1961, 52 y.o.   MRN: [id number] [facility] MD Progress Note  07/10/2013 1:45 PM Stephen Myers  MRN:  [id number] Subjective:  Patient states that he has had a rough start this morning as his blood pressure medication had not been given. He has since gotten this medication and feels better. He reports that he continues to feel better and he knows that he is better because he is able to smile and laugh more. He knotes that he feels ready to be discharged home tomorrow.  He denies SI/HI and AVH. He feels that he has learned the coping skills he needs to return home and do well.  Diagnosis:   DSM5:  Depressive Disorders:  Major Depressive Disorder - Severe (296.23)  Axis I: Anxiety Disorder NOS and Major Depression, Recurrent severe Axis II: Deferred Axis III:  Past Medical History  Diagnosis Date  . Irritability and anger     symptoms at home and work  . Anxiety   . Depression    Axis IV: other psychosocial or environmental problems, problems related to social environment and problems with primary support group Axis V: 41-50 serious symptoms  ADL's:  Intact  Sleep: Poor  Appetite:  Good  Suicidal Ideation:  Denies Homicidal Ideation:  Denies   Psychiatric Specialty Exam: Review of Systems  Constitutional: Negative.   HENT: Negative.   Eyes: Negative.   Respiratory: Negative.   Cardiovascular: Negative.   Gastrointestinal: Negative.   Genitourinary: Negative.   Musculoskeletal: Negative.   Skin: Negative.   Neurological: Negative.   Endo/Heme/Allergies: Negative.   Psychiatric/Behavioral: Positive for depression. The patient is nervous/anxious.     Blood pressure 158/104, pulse 113, temperature 97 F (36.1 C), temperature source Oral, resp. rate 18, height 6\' 3"  (1.905 m), weight 128.822 kg (284 lb).Body mass index is 35.5 kg/(m^2).  General Appearance: Casual  Eye Contact::  Good  Speech:  Normal Rate  Volume:  Normal   Mood:  Anxious and Depressed  Affect:  Depressed  Thought Process:  Coherent  Orientation:  Full (Time, Place, and Person)  Thought Content:  WDL  Suicidal Thoughts:  No  Homicidal Thoughts:  No  Memory:  Immediate;   Fair Recent;   Fair Remote;   Fair  Judgement:  Fair  Insight:  Fair  Psychomotor Activity:  Decreased  Concentration:  Fair  Recall:  Fair  Akathisia:  No  Handed:  Right  AIMS (if indicated):     Assets:  Communication Skills Resilience Social Support  Sleep:  Number of Hours: 6.75   Current Medications: Current Facility-Administered Medications  Medication Dose Route Frequency Provider Last Rate Last Dose  . acetaminophen (TYLENOL) tablet 650 mg  650 mg Oral Q6H PRN Nehemiah Settle, MD   650 mg at 07/10/13 0853  . alum & mag hydroxide-simeth (MAALOX/MYLANTA) 200-200-20 MG/5ML suspension 30 mL  30 mL Oral Q4H PRN Nehemiah Settle, MD      . ARIPiprazole (ABILIFY) tablet 10 mg  10 mg Oral QHS Nehemiah Settle, MD   10 mg at 07/09/13 2126  . aspirin EC tablet 81 mg  81 mg Oral Daily Nehemiah Settle, MD   81 mg at 07/10/13 0853  . atorvastatin (LIPITOR) tablet 40 mg  40 mg Oral q1800 Nehemiah Settle, MD   40 mg at 07/09/13 1720  . clonazePAM (KLONOPIN) tablet 1 mg  1 mg Oral BID Nehemiah Settle, MD   1 mg  at 07/10/13 0853  . lithium carbonate capsule 600 mg  600 mg Oral QHS Nehemiah Settle, MD   600 mg at 07/09/13 2126  . losartan (COZAAR) tablet 50 mg  50 mg Oral Daily Sanjuana Kava, NP   50 mg at 07/10/13 0920  . magnesium hydroxide (MILK OF MAGNESIA) suspension 30 mL  30 mL Oral Daily PRN Nehemiah Settle, MD      . traZODone (DESYREL) tablet 50 mg  50 mg Oral QHS Nanine Means, NP   50 mg at 07/09/13 2127    Lab Results:  No results found for this or any previous visit (from the past 48 hour(s)).  Physical Findings: AIMS: Facial and Oral Movements Muscles of Facial Expression:  None, normal Lips and Perioral Area: None, normal Jaw: None, normal Tongue: None, normal,Extremity Movements Upper (arms, wrists, hands, fingers): None, normal Lower (legs, knees, ankles, toes): None, normal, Trunk Movements Neck, shoulders, hips: None, normal, Overall Severity Severity of abnormal movements (highest score from questions above): None, normal Incapacitation due to abnormal movements: None, normal Patient's awareness of abnormal movements (rate only patient's report): No Awareness, Dental Status Current problems with teeth and/or dentures?: No Does patient usually wear dentures?: No  CIWA:  CIWA-Ar Total: 5 COWS:  COWS Total Score: 5  Treatment Plan Summary: Daily contact with patient to assess and evaluate symptoms and progress in treatment Medication management  Plan:  Review of chart, vital signs, medications, and notes. 1-Individual and group therapy 2-Medication management for depression and anxiety:  Medications reviewed with the patient and Trazodone added for sleep issues 3-Coping skills for depression, anxiety, 4-Continue crisis stabilization and management 5-Address health issues--monitoring vital signs, stable 6-Treatment plan in progress to prevent relapse of depression and anxiety 7. Continue current plan of care with no changes at this time. 8. ELOS: will d/c home in the morning. Medical Decision Making Problem Points:  Established problem, stable/improving (1) and Review of psycho-social stressors (1) Data Points:  Review of medication regiment & side effects (2)  I certify that inpatient services furnished can reasonably be expected to improve the patient's condition.   Rona Ravens. Mashburn [facility] 07/10/2013 4:41 PM  I agreed with findings and treatment plan of this patient

## 2013-07-10 NOTE — [facility] Group Notes (Signed)
[facility] Group Notes:  (Clinical Social Work)  07/10/2013   1:15-2:30PM  Summary of Progress/Problems:   The main focus of today's process group was to   identify the patient's current support system and decide on other supports that can be put in place.  The picture on workbook was used to discuss why additional supports are needed, and a hand-out was distributed with four definitions/levels of support, then used to talk about how patients have given and received all different kinds of support.  An emphasis was placed on using counselor, doctor, therapy groups, 12-step groups, and problem-specific support groups to expand supports.  The patient identified family and friends as being supportive, and gave a long list of these people.  He listened intently throughout group, nodded in agreement numerous times, smiled, and was engaged.  He simply did not speak up himself.   Type of Therapy:  Process Group  Participation Level:  Active  Participation Quality:  Attentive  Affect:  Appropriate  Cognitive:  Appropriate and Oriented  Insight:  Developing/Improving  Engagement in Therapy:  Engaged  Modes of Intervention:  Education,  Support and [employer], LCSW 07/10/2013, 3:03 PM

## 2013-07-10 NOTE — Progress Notes (Signed)
D) Pt not feeling well this morning. States he is having a headache r/t not having had his blood pressure medication. Asked if he could lie in his bed this morning and not attend groups. In a 1:1 Pt talked about his daughter and stated that she had been a Pt. On the [address] in the past and she had used Heroine in the past and presently is using street drugs. Pt's daughter has also stolen his medications. Pt states that his anxiety and agoraphobia started when he found out that his daughter was using drugs. Has a fear of finding her dead, since she still lives in the home. Pt  Verbalized feeling totally responsible that his daughter is using drugs. Feels that he could have done something different or better. A) Provided Pt with a 1:1. Talked with Pt about he and his wife attending Al-Anon so that he could hopefully get some perspective and start setting some boundaries. Also suggested that he and his wife could see a therapist together to help each other through this as well.. Blood pressure medication started this morning. Given reassurance that this is not his fault. R) Pt is sad rates his depression at a 3 and his hopelessness at a 2 presently.

## 2013-07-10 NOTE — Progress Notes (Signed)
Psychoeducational Group Note  Date:  07/10/2013 Time:  1015  Group Topic/Focus:  Making Healthy Choices:   The focus of this group is to help patients identify negative/unhealthy choices they were using prior to admission and identify positive/healthier coping strategies to replace them upon discharge.  Participation Level:  Active  Participation Quality:  Appropriate  Affect:  Appropriate  Cognitive:  Oriented  Insight:  Improving  Engagement in Group:  Improving  Additional Comments:   Zyara Riling A 07/10/2013 

## 2013-07-10 NOTE — Progress Notes (Signed)
Psychoeducational Group Note  Psychoeducational Group Note  Date: 07/10/2013 Time:  0930  Group Topic/Focus:  Gratefulness:  The focus of this group is to help patients identify what two things they are most grateful for in their lives. What helps ground them and to center them on their work to their recovery.  Participation Level:  Active  Participation Quality:  Appropriate  Affect:  Appropriate  Cognitive:  Oriented  Insight:  Improving  Engagement in Group:  Engaged  Additional Comments:    Ranie Chinchilla A  

## 2013-07-11 ENCOUNTER — Other Ambulatory Visit (HOSPITAL_COMMUNITY): Payer: 59

## 2013-07-11 MED ORDER — LOSARTAN POTASSIUM 50 MG PO TABS: 50.0000 mg | ORAL_TABLET | Freq: Every day | ORAL | Status: DC

## 2013-07-11 MED ORDER — ARIPIPRAZOLE 10 MG PO TABS: 10.0000 mg | ORAL_TABLET | Freq: Every day | ORAL | Status: DC

## 2013-07-11 MED ORDER — TRAZODONE HCL 50 MG PO TABS: 50.0000 mg | ORAL_TABLET | Freq: Every day | ORAL | Status: DC

## 2013-07-11 MED ORDER — NALTREXONE HCL 50 MG PO TABS
50.0000 mg | ORAL_TABLET | Freq: Every day | ORAL | Status: DC
  Filled 2013-07-11: qty 3

## 2013-07-11 MED ORDER — ATORVASTATIN CALCIUM 40 MG PO TABS: 40.0000 mg | ORAL_TABLET | Freq: Every day | ORAL | Status: DC

## 2013-07-11 MED ORDER — LITHIUM CARBONATE 600 MG PO CAPS: 600.0000 mg | ORAL_CAPSULE | Freq: Every day | ORAL | Status: DC

## 2013-07-11 MED ORDER — CLONAZEPAM 1 MG PO TABS: 1.0000 mg | ORAL_TABLET | Freq: Two times a day (BID) | ORAL | Status: DC

## 2013-07-11 MED ORDER — ASPIRIN 81 MG PO TABS: 81.0000 mg | ORAL_TABLET | Freq: Every day | ORAL | Status: DC

## 2013-07-11 MED ORDER — NALTREXONE HCL 50 MG PO TABS: 50.0000 mg | ORAL_TABLET | Freq: Every day | ORAL | Status: DC

## 2013-07-11 MED ORDER — VITAMIN B-12 500 MCG PO TABS: 1000.0000 ug | ORAL_TABLET | Freq: Every day | ORAL | Status: DC

## 2013-07-11 NOTE — Progress Notes (Signed)
Chaplain consulted with pt at [facility] referral.   Provided emotional and spiritual support.

## 2013-07-11 NOTE — [facility] Suicide Risk Assessment (Signed)
Suicide Risk Assessment  Discharge Assessment     Demographic Factors:  Male, Adolescent or young adult, Caucasian and Unemployed  Mental Status Per Nursing Assessment::   On Admission:  Suicidal ideation indicated by patient;Suicide plan  Current Mental Status by Physician: Mental Status Examination: Patient appeared as per his stated age, casually dressed, and fairly groomed, and maintaining good eye contact. Patient has good mood and his affect was appropriate. He has normal rate, rhythm, and volume of speech. His thought process is linear and goal directed. Patient has denied suicidal, homicidal ideations, intentions or plans. Patient has no evidence of auditory or visual hallucinations, delusions, and paranoia. Patient has fair insight judgment and impulse control.  Loss Factors: Decline in physical health  Historical Factors: Impulsivity  Risk Reduction Factors:   Sense of responsibility to family, Religious beliefs about death, Living with another person, especially a relative, Positive social support, Positive therapeutic relationship and Positive coping skills or problem solving skills  Continued Clinical Symptoms:  Severe Anxiety and/or Agitation Depression:   Hopelessness Recent sense of peace/wellbeing Obsessive-Compulsive Disorder Previous Psychiatric Diagnoses and Treatments Medical Diagnoses and Treatments/Surgeries  Cognitive Features That Contribute To Risk:  Polarized thinking    Suicide Risk:  Minimal: No identifiable suicidal ideation.  Patients presenting with no risk factors but with morbid ruminations; may be classified as minimal risk based on the severity of the depressive symptoms  Discharge Diagnoses:   AXIS I:  Major Depression, Recurrent severe, Obsessive Compulsive Disorder and Post Traumatic Stress Disorder AXIS II:  Deferred AXIS III:   Past Medical History  Diagnosis Date  . Irritability and anger     symptoms at home and work  . Anxiety    . Depression    AXIS IV:  other psychosocial or environmental problems and problems related to social environment AXIS V:  61-70 mild symptoms  Plan Of Care/Follow-up recommendations:  Activity:  As tolerated Diet:  Regular  Is patient on multiple antipsychotic therapies at discharge:  No   Has Patient had three or more failed trials of antipsychotic monotherapy by history:  No  Recommended Plan for Multiple Antipsychotic Therapies: NA  Tomeika Weinmann,JANARDHAHA R. 07/11/2013, 12:57 PM

## 2013-07-11 NOTE — Discharge Summary (Signed)
Physician Discharge Summary Note  Patient:  Stephen Myers is an 52 y.o., male MRN:  [id number] DOB:  17-Apr-1961 Patient phone:  [phone] (home)  Patient address:   [address] [place] [address],   Date of Admission:  07/07/2013 Date of Discharge: 07/11/2013   Reason for Admission:  Suicidal ideation with plan  Discharge Diagnoses: Principal Problem:   Depression, major, recurrent, moderate Active Problems:   Suicidal ideation   OCD (obsessive compulsive disorder)   Post traumatic stress disorder  Review of Systems  Constitutional: Negative.  Negative for fever, chills, weight loss, malaise/fatigue and diaphoresis.  HENT: Negative for congestion and sore throat.   Eyes: Negative for blurred vision, double vision and photophobia.  Respiratory: Negative for cough, shortness of breath and wheezing.   Cardiovascular: Negative for chest pain, palpitations and PND.  Gastrointestinal: Negative for heartburn, nausea, vomiting, abdominal pain, diarrhea and constipation.  Musculoskeletal: Negative for myalgias, joint pain and falls.  Neurological: Negative for dizziness, tingling, tremors, sensory change, speech change, focal weakness, seizures, loss of consciousness, weakness and headaches.  Endo/Heme/Allergies: Negative for polydipsia. Does not bruise/bleed easily.  Psychiatric/Behavioral: Negative for depression, suicidal ideas, hallucinations, memory loss and substance abuse. The patient is not nervous/anxious and does not have insomnia.     DSM5: DSM5:  Schizophrenia Disorders:  Obsessive-Compulsive Disorders:  Trauma-Stressor Disorders: Posttraumatic Stress Disorder (309.81)  Substance/Addictive Disorders:  Depressive Disorders: Major Depressive Disorder - Severe (296.23)  AXIS I: Bipolar, Depressed, Major Depression, Recurrent severe, Panic Disorder and Post Traumatic Stress Disorder  AXIS II: Deferred  AXIS III:  Past Medical History   Diagnosis  Date    .  Irritability and anger      symptoms at home and work   .  Anxiety    .  Depression     AXIS IV: occupational problems, other psychosocial or environmental problems and problems related to social environment   AXIS V: 41-50 serious symptoms  Level of Care:  OP  Hospital Course:  Stephen Myers was admitted to [facility] after he presented to the counselor at his IOP programming reporting increased depression with plans to kill himself by asphyxiation due to carbon Monoxide poisoning by sitting in his car while it was running while in his garage.     Stephen Myers was admitted to the unit and evaluated. The symptoms were identified as worsening depression, anhedonia, decreased sleep, poor appetite, feelings of guilt and worthlessness, helplessness and hopelessness, increased fatigue, crying, anhedonia, guilt, isolation, difficulty concentrating, irritability, mood swings, and increased  anxiety, excessive worry, panic attacks, social anxiety, agoraphobia, agitation, irritability, mood swings, and mood lability.        The patient was oriented to the unit and encouraged to participate in unit programming. Medical problems were identified and treated appropriately. Home medication was restarted as needed. Psychiatric medication management was initiated.        The patient was evaluated each day by a clinical provider to ascertain the patient's response to treatment.  Improvement was noted by the patient's report of decreasing symptoms, improved sleep and appetite, affect, medication tolerance, behavior, and participation in unit programming.  Stephen Myers was asked each day to complete a self inventory noting mood, mental status, pain, new symptoms, anxiety and concerns.         He responded well to medication and being in a therapeutic and supportive environment. Positive and appropriate behavior was noted and the patient was motivated for recovery. Stephen Myers worked closely with the treatment team and  case manager to develop a discharge  plan with appropriate goals. Coping skills, problem solving as well as relaxation therapies were also part of the unit programming.         By the day of discharge the patient was in much improved condition than upon admission.  Symptoms were reported as significantly decreased or resolved completely.  The patient denied SI/HI and voiced no AVH. He was motivated to continue taking medication with a goal of continued improvement in mental health.  He was discharged home with a plan to follow up as noted below. Consults:  None  Significant Diagnostic Studies:  labs: CBC, CMP, UA, UDS  Discharge Vitals:   Blood pressure 136/89, pulse 111, temperature 96.7 F (35.9 C), temperature source Oral, resp. rate 16, height 6\' 3"  (1.905 m), weight 128.822 kg (284 lb). Body mass index is 35.5 kg/(m^2). Lab Results:   Results for orders placed during the hospital encounter of 07/07/13 (from the past 72 hour(s))  LITHIUM LEVEL     Status: Abnormal   Collection Time    07/11/13  6:20 AM      Result Value Range   Lithium Lvl 0.43 (*) 0.80 - 1.40 mEq/L   Comment: Performed at [facility]    Physical Findings: AIMS: Facial and Oral Movements Muscles of Facial Expression: None, normal Lips and Perioral Area: None, normal Jaw: None, normal Tongue: None, normal,Extremity Movements Upper (arms, wrists, hands, fingers): None, normal Lower (legs, knees, ankles, toes): None, normal, Trunk Movements Neck, shoulders, hips: None, normal, Overall Severity Severity of abnormal movements (highest score from questions above): None, normal Incapacitation due to abnormal movements: None, normal Patient's awareness of abnormal movements (rate only patient's report): No Awareness, Dental Status Current problems with teeth and/or dentures?: No Does patient usually wear dentures?: No  CIWA:  CIWA-Ar Total: 5 COWS:  COWS Total Score: 5  Psychiatric Specialty Exam: See Psychiatric Specialty Exam and  Suicide Risk Assessment completed by Attending Physician prior to discharge.  Discharge destination:  Home  Is patient on multiple antipsychotic therapies at discharge:  No   Has Patient had three or more failed trials of antipsychotic monotherapy by history:  No  Recommended Plan for Multiple Antipsychotic Therapies: NA  Discharge Orders   Future Orders Complete By Expires   Diet - low sodium heart healthy  As directed    Discharge instructions  As directed    Comments:     Take all of your medications as directed. Be sure to keep all of your follow up appointments.  If you are unable to keep your follow up appointment, call your Doctor's office to let them know, and reschedule.  Make sure that you have enough medication to last until your appointment. Be sure to get plenty of rest. Going to bed at the same time each night will help. Try to avoid sleeping during the day.  Increase your activity as tolerated. Regular exercise will help you to sleep better and improve your mental health. Eating a heart healthy diet is recommended. Try to avoid salty or fried foods. Be sure to avoid all alcohol and illegal drugs.   Increase activity slowly  As directed        Medication List    STOP taking these medications       ibuprofen 200 MG tablet  Commonly known as:  ADVIL,MOTRIN      TAKE these medications     Indication   ARIPiprazole 10 MG tablet  Commonly  known as:  ABILIFY  Take 1 tablet (10 mg total) by mouth at bedtime. For adjuvant therapy for depression.   Indication:  Major Depressive Disorder     aspirin 81 MG tablet  Take 1 tablet (81 mg total) by mouth daily.   Indication:  Blood Clot     atorvastatin 40 MG tablet  Commonly known as:  LIPITOR  Take 1 tablet (40 mg total) by mouth daily. For cholesterol.   Indication:  Type II B Hyperlipidemia     clonazePAM 1 MG tablet  Commonly known as:  KLONOPIN  Take 1 tablet (1 mg total) by mouth 2 (two) times daily. For anxiety.    Indication:  Panic Disorder     lithium 600 MG capsule  Take 1 capsule (600 mg total) by mouth at bedtime. For mood stabilization.   Indication:  Depression     losartan 50 MG tablet  Commonly known as:  COZAAR  Take 1 tablet (50 mg total) by mouth daily. For hypertension.   Indication:  High Blood Pressure     naltrexone 50 MG tablet  Commonly known as:  DEPADE  Take 1 tablet (50 mg total) by mouth daily. For adjunctive therapy for depression.   Indication:  Posttraumatic Stress Disorder     traZODone 50 MG tablet  Commonly known as:  DESYREL  Take 1 tablet (50 mg total) by mouth at bedtime. For insomnia.   Indication:  Trouble Sleeping     vitamin B-12 500 MCG tablet  Commonly known as:  CYANOCOBALAMIN  Take 2 tablets (1,000 mcg total) by mouth daily. For low iron deficiency.   Indication:  Inadequate Vitamin B12           Follow-up Information   Follow up with [facility]. (You are scheduled to resume [facility] on)    Contact information:   [address] [place], [place]   [address]  [phone]      Follow-up recommendations:   Activities: Resume activity as tolerated. Diet: Heart healthy low sodium diet Tests: Follow up testing will be determined by your out patient provider.  Comments:    Total Discharge Time:  Greater than 30 minutes.  Signed: MASHBURN,NEIL 07/11/2013, 11:56 AM  Patient is seen personally for suicidal risk assessment and case discussed with physician extender and developed treatment plan. Reviewed the information documented and agree with the treatment plan.  Aceton Kinnear,JANARDHAHA R. 07/13/2013 3:53 PM

## 2013-07-11 NOTE — Progress Notes (Signed)
Patient ID: Stephen Myers, male   DOB: 1961/06/27, 52 y.o.   MRN: [id number] D)  Has been up and about, pleasant, interacting appropriately with staff and peers.  Attended group, was attentive and participated.  Came to med window afterward for hs meds, feeling better now that bp meds restarted.   A)  Will continue to monitor for safety, continue POC Safety maintained.

## 2013-07-11 NOTE — Progress Notes (Signed)
Adult Psychoeducational Group Note  Date:  07/10/13 Time:  8:00 pm  Group Topic/Focus:  Wrap-Up Group:   The focus of this group is to help patients review their daily goal of treatment and discuss progress on daily workbooks.  Participation Level:  Active  Participation Quality:  Appropriate  Affect:  Appropriate  Cognitive:  Appropriate  Insight: Good  Engagement in Group:  Engaged  Modes of Intervention:  Discussion, Education, Socialization and Support  Additional Comments: Pt stated that he was in the hospital because of depression and anxiety. Pt stated that he was considering suicide. Pt stated that he is hardworking and well organized.   Sun Wilensky 07/11/2013, 12:39 AM

## 2013-07-11 NOTE — Progress Notes (Signed)
Patient ID: Stephen Myers, male   DOB: Feb 06, 1961, 52 y.o.   MRN: [id number] Patient discharged per physician order; patient denies SI/HI and A/V hallucinations; patient received samples, prescriptions, and copy of AVS after it was reviewed; patient verbalized understanding; patient had no other questions or concerns at this time; patient verbalized and signed that he received all belongings; patient left the unit ambulatory with his wife

## 2013-07-11 NOTE — [facility] Group Notes (Signed)
[facility] LCSW Aftercare Discharge Planning Group Note   07/11/2013 10:58 AM   Participation Quality:  Appropraite  Mood/Affect:  Appropriate  Depression Rating:  3  Anxiety Rating:  3  Thoughts of Suicide:  No  Will you contract for safety?   NA  Current AVH:  No  Plan for Discharge/Comments:  Patient attending discharge planning group and actively participated in group.  Patient to return to [facility] for follow up.  CSW provided all participants with daily workbook.    Transportation Means: Patient has transportation.   Supports:  Patient has a support system.   Ayianna Darnold, Joesph July

## 2013-07-11 NOTE — Tx Team (Signed)
Interdisciplinary Treatment Plan Update   Date Reviewed:  07/11/2013  Time Reviewed:  9:54 AM  Progress in Treatment:   Attending groups: Yes Participating in groups: Yes Taking medication as prescribed: Yes  Tolerating medication: Yes Family/Significant other contact made: Yes, contact made with wife.  Patient understands diagnosis: Yes  Discussing patient identified problems/goals with staff: Yes Medical problems stabilized or resolved: Yes Denies suicidal/homicidal ideation: Yes Patient has not harmed self or others: Yes  For review of initial/current patient goals, please see plan of care.  Estimated Length of Stay: Discharge toay  Reasons for Continued Hospitalization:   New Problems/Goals identified:    Discharge Plan or Barriers:   Home with outpatient follow up with MH-IOP  Additional Comments:  N/A  Attendees:  Patient:  Stephen Myers 07/11/2013 9:54 AM   Signature: Mervyn Gay, MD 07/11/2013 9:54 AM  Signature:  Verne Spurr, PA 07/11/2013 9:54 AM  Signature: Leighton Parody, RN 07/11/2013 9:54 AM  Signature:  Quintella Reichert, RN 07/11/2013 9:54 AM  Signature:  Neill Loft RN 07/11/2013 9:54 AM  Signature:  Juline Patch, LCSW 07/11/2013 9:54 AM  Signature:  Reyes Ivan, LCSW 07/11/2013 9:54 AM  Signature:  Maseta Dorley,Care Coordinator 07/11/2013 9:54 AM  Signature:  07/11/2013 9:54 AM  Signature:  07/11/2013  9:54 AM  Signature:    Signature:      Scribe for Treatment Team:   Juline Patch,  07/11/2013 9:54 AM

## 2013-07-11 NOTE — Progress Notes (Signed)
[facility] Adult Case Management Discharge Plan :  Will you be returning to the same living situation after discharge: Yes,  Patient is returning home with wife. At discharge, do you have transportation home?:Yes,  Patient to arrange transporation home. Do you have the ability to pay for your medications:Yes,  Patient is able to obtain medications.  Release of information consent forms completed and in the chart;  Patient's signature needed at discharge.  Patient to Follow up at:   Wednesday, July 13, 2013 at 8:45 AM Follow-up Information   Follow up with [facility]. (You are scheduled to resume MH-IOP on)    Contact information:   [address] [place], [place]   [address]  [phone]      Patient denies SI/HI:   Patient no longer endorsing SI/HI or other thoughts of self harm.     Safety Planning and Suicide Prevention discussed:  .Reviewed with all patients during discharge planning group  Stephen Myers, [place] July 07/11/2013, 10:59 AM

## 2013-07-12 ENCOUNTER — Other Ambulatory Visit (HOSPITAL_COMMUNITY): Payer: 59

## 2013-07-13 ENCOUNTER — Encounter (HOSPITAL_COMMUNITY): Payer: Self-pay

## 2013-07-13 ENCOUNTER — Other Ambulatory Visit (HOSPITAL_COMMUNITY): Payer: 59 | Attending: Psychiatry | Admitting: Psychiatry

## 2013-07-13 ENCOUNTER — Other Ambulatory Visit (HOSPITAL_COMMUNITY): Payer: 59

## 2013-07-13 ENCOUNTER — Telehealth (HOSPITAL_COMMUNITY): Payer: Self-pay | Admitting: Psychiatry

## 2013-07-13 DIAGNOSIS — F331 Major depressive disorder, recurrent, moderate: Secondary | ICD-10-CM

## 2013-07-13 DIAGNOSIS — F4001 Agoraphobia with panic disorder: Secondary | ICD-10-CM

## 2013-07-13 DIAGNOSIS — F410 Panic disorder [episodic paroxysmal anxiety] without agoraphobia: Secondary | ICD-10-CM | POA: Insufficient documentation

## 2013-07-13 DIAGNOSIS — F3130 Bipolar disorder, current episode depressed, mild or moderate severity, unspecified: Secondary | ICD-10-CM | POA: Insufficient documentation

## 2013-07-13 DIAGNOSIS — F419 Anxiety disorder, unspecified: Secondary | ICD-10-CM

## 2013-07-13 NOTE — Progress Notes (Signed)
    Daily Group Progress Note  Program: IOP  Group Time: 9:00-10:30 am   Participation Level: Active  Behavioral Response: Appropriate  Type of Therapy:  Process Group  Summary of Progress: This is Pts first day back to the group following his inpatient hospitalization. Pt reports improved mood and described being glad to be back with the group members. Pt said he is hoping to continue working on depression and anxiety symptoms.      Group Time: 10:30 am - 12:00 pm   Participation Level:  Active  Behavioral Response: Appropriate  Type of Therapy: Psycho-education Group  Summary of Progress: Pt learned about effective communication in relationships and learned about the [employer] and how to identify them with self and others to ensure more effective communication.   Carman Ching, LCSW

## 2013-07-13 NOTE — Progress Notes (Signed)
Patient ID: Stephen Myers, male   DOB: 11/14/1960, 52 y.o.   MRN: [id number]  Patient is transitioning back into the intensive outpatient program after having been hospitalized on the adult unit of [facility] due to increased depression, panic attacks, and suicidal thoughts while attending IOP. He reports that he is significantly improved. He denies any further suicidal thoughts. He reports that his Klonopin has been increased to 1 mg twice daily, lithium has been decreased to 600 mg at bedtime, and trazodone 50 mg at bedtime was added for sleep.  Plan: We'll admit to IOP and continue medications per discharge from [facility] adult unit.

## 2013-07-13 NOTE — Telephone Encounter (Signed)
Placed call to LuAnne at [facility] Disability re: patient transitioning back to [facility] today.  This writer left her name and phone number.

## 2013-07-13 NOTE — Progress Notes (Signed)
Patient ID: Stephen Myers, male   DOB: [birth year], 52 y.o.   MRN: [id number] D: This is a 57 married, caucasian male referred previously by therapist Serafina Mitchell, [facility]), treatment for depressive symptoms, panic attacks, and agoraphobia with SI (plan: Carbon monoxide or Overdose). Had been seeing Serafina Mitchell, [facility] and Dr. Hilton Cork since April 2014. Reported no prior suicide attempts or gestures. Currently denies any SI/HI or A/V hallucinations.   According to pt, his symptoms worsened April 2014, furthermore states he's been struggling with the symptoms for 1-2 years. 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 "It is tough getting out of my car. The bigger the store the more difficult." 2) 54 yo addict daughter. Hx of using heroin. Discovered she was using in May 2013. She was admitted at [facility]. Pt states she's currently not using heroin, but is abusing only Benzodiazepines. According to pt, she (daughter) stole his Klonopin a couple of months ago. Pt also mentioned that his daughter's boyfriend overdosed (heroin) and died last year.  Pt was admitted on the inpatient psychiatric unit on 07-07-13 due to SI.  Transitioned from the inpatient unit today.  States he feels much better.  Pt will attend ~ ten days.  A:  Re-oriented pt.  Informed Dr. Laury Deep and Serafina Mitchell of transition.  Encouraged support groups.  R:  Pt receptive.

## 2013-07-14 ENCOUNTER — Other Ambulatory Visit (HOSPITAL_COMMUNITY): Payer: 59

## 2013-07-14 ENCOUNTER — Ambulatory Visit (HOSPITAL_COMMUNITY): Payer: Self-pay | Admitting: Behavioral Health

## 2013-07-14 ENCOUNTER — Other Ambulatory Visit (HOSPITAL_COMMUNITY): Payer: 59 | Admitting: Psychiatry

## 2013-07-14 DIAGNOSIS — F331 Major depressive disorder, recurrent, moderate: Secondary | ICD-10-CM

## 2013-07-14 DIAGNOSIS — F419 Anxiety disorder, unspecified: Secondary | ICD-10-CM

## 2013-07-14 NOTE — Progress Notes (Signed)
Patient Discharge Instructions:  Next Level Care Provider Has Access to the EMR, 07/14/13 Records provided to [facility] via [facility]/Epic access.  Jerelene Redden, 07/14/2013, 3:24 PM

## 2013-07-14 NOTE — Progress Notes (Signed)
    Daily Group Progress Note  Program: IOP  Group Time: 9:00-10:30 am   Participation Level: Active  Behavioral Response: Appropriate  Type of Therapy:  Process Group  Summary of Progress: Pt reports stable mood and has elevated affect. Pt shared his experience leaving the IOP group and transitioning into the hospital for additional treatment due to suicidal thinking. Pt said he "would not change a thing about that experience" and it helped him feel less depressed, find hope and have a desire to live again. Pt said he learned he is not the only one dealing with depression as he was surrounded by people similar to himself and his situation.      Group Time: 10:30 am - 12:00 pm   Participation Level:  Active  Behavioral Response: Appropriate  Type of Therapy: Psycho-education Group  Summary of Progress: Pt learned about the DBT skill of Mindfulness and how to use the skill of observing to be centered in this moment to reduce overwhelming feelings and increase a sense of overall wellness.   Carman Ching, LCSW

## 2013-07-15 ENCOUNTER — Other Ambulatory Visit (HOSPITAL_COMMUNITY): Payer: 59

## 2013-07-15 ENCOUNTER — Other Ambulatory Visit (HOSPITAL_COMMUNITY): Payer: 59 | Admitting: Psychiatry

## 2013-07-15 DIAGNOSIS — F419 Anxiety disorder, unspecified: Secondary | ICD-10-CM

## 2013-07-15 NOTE — Progress Notes (Signed)
    Daily Group Progress Note  Program: IOP  Group Time: 9:00-10:30 am   Participation Level: Active  Behavioral Response: Appropriate  Type of Therapy:  Process Group  Summary of Progress: Pt reports minimal depression but high anxiety. Pt said he had to drive himself to group today and was fearful the entire way here due to his fear of open spaces. Pt said he feels so much improved now that his depression has gone down and processed ways to challenge his anxiety using "baby steps for change".      Group Time: 10:30 am - 12:00 pm   Participation Level:  Active  Behavioral Response: Appropriate  Type of Therapy: Psycho-education Group  Summary of Progress: Pt participated in a goodbye ceremony to two members leaving group today and expressed words of goodbye and well wishes while practicing the skill of healthy closure.   Carman Ching, LCSW

## 2013-07-18 ENCOUNTER — Other Ambulatory Visit (HOSPITAL_COMMUNITY): Payer: 59

## 2013-07-18 ENCOUNTER — Other Ambulatory Visit (HOSPITAL_COMMUNITY): Payer: 59 | Admitting: Psychiatry

## 2013-07-18 DIAGNOSIS — F331 Major depressive disorder, recurrent, moderate: Secondary | ICD-10-CM

## 2013-07-18 DIAGNOSIS — F419 Anxiety disorder, unspecified: Secondary | ICD-10-CM

## 2013-07-18 NOTE — Progress Notes (Signed)
    Daily Group Progress Note  Program: IOP  Group Time: 9:00-10:30 am   Participation Level: Active  Behavioral Response: Appropriate  Type of Therapy:  Process Group  Summary of Progress: Pt presented with severe anxiety that appeared to be escalating into a panic attack. Pt became tearful when saying his daughter is addicted to Heroin an described having PTSD associated with the fear of her dying. Pt said he was triggered today with anxiety when his wife asked Pt to wake his daughter up and he feared what state he would find her in. Pt processed fears of the possibly of her dying from overdose and took some time to walk around in the garden and do some relaxation breathing. Pt is still struggling with racing worrisome thinking that is fueling both depression an anxiety. He has good days and stressful days.      Group Time: 10:30 am - 12:00 pm   Participation Level:  Active  Behavioral Response: Appropriate  Type of Therapy: Psycho-education Group  Summary of Progress: Pt participated in a group with a focus on grief and loss and identified current losses impacting wellness and effective grieving strategies.   Carman Ching, LCSW

## 2013-07-19 ENCOUNTER — Other Ambulatory Visit (HOSPITAL_COMMUNITY): Payer: 59

## 2013-07-19 ENCOUNTER — Encounter: Payer: Self-pay | Admitting: Sports Medicine

## 2013-07-19 ENCOUNTER — Ambulatory Visit (INDEPENDENT_AMBULATORY_CARE_PROVIDER_SITE_OTHER): Payer: 59 | Admitting: Sports Medicine

## 2013-07-19 ENCOUNTER — Other Ambulatory Visit (HOSPITAL_COMMUNITY): Payer: 59 | Admitting: Psychiatry

## 2013-07-19 VITALS — BP 152/86 | HR 80 | Wt 286.0 lb

## 2013-07-19 DIAGNOSIS — F331 Major depressive disorder, recurrent, moderate: Secondary | ICD-10-CM

## 2013-07-19 DIAGNOSIS — I10 Essential (primary) hypertension: Secondary | ICD-10-CM

## 2013-07-19 DIAGNOSIS — Z299 Encounter for prophylactic measures, unspecified: Secondary | ICD-10-CM

## 2013-07-19 DIAGNOSIS — F419 Anxiety disorder, unspecified: Secondary | ICD-10-CM

## 2013-07-19 DIAGNOSIS — E785 Hyperlipidemia, unspecified: Secondary | ICD-10-CM

## 2013-07-19 MED ORDER — LOSARTAN POTASSIUM 50 MG PO TABS: 50.0000 mg | ORAL_TABLET | Freq: Every day | ORAL | Status: DC

## 2013-07-19 NOTE — Progress Notes (Signed)
  Subjective:    CC: Follow up  HPI: Hypertension: Was discontinued off of his Cozaar in the hospital, needs a refill. No headaches, visual changes, chest pain, lower extremity swelling.  Hyperlipidemia: Stable on Lipitor, lipid panel was very well controlled in the hospital just a few days ago.  Depression: [facility], stabilized, and now is in intensive outpatient therapy.  Past medical history, Surgical history, Family history not pertinant except as noted below, Social history, Allergies, and medications have been entered into the medical record, reviewed, and no changes needed.   Review of Systems: No fevers, chills, night sweats, weight loss, chest pain, or shortness of breath.   Objective:    General: Well Developed, well nourished, and in no acute distress.  Neuro: Alert and oriented x3, extra-ocular muscles intact, sensation grossly intact.  HEENT: Normocephalic, atraumatic, pupils equal round reactive to light, neck supple, no masses, no lymphadenopathy, thyroid nonpalpable.  Skin: Warm and dry, no rashes. Cardiac: Regular rate and rhythm, no murmurs rubs or gallops, no lower extremity edema.  Respiratory: Clear to auscultation bilaterally. Not using accessory muscles, speaking in full sentences. Impression and Recommendations:

## 2013-07-19 NOTE — Assessment & Plan Note (Signed)
Lipids are extremely well controlled on Lipitor. No changes.

## 2013-07-19 NOTE — Assessment & Plan Note (Signed)
Improved after recent hospitalization for suicidal ideation. Currently in intensive outpatient treatment with psychiatry. I have encouraged him to continue close followup with Dr. Laury Deep.

## 2013-07-19 NOTE — Assessment & Plan Note (Signed)
We will discuss colonoscopy to complete his screening workup at the next visit.

## 2013-07-19 NOTE — Assessment & Plan Note (Signed)
He was taken off of his blood pressure medicine in the inpatient setting, blood pressures are now extremely high. Refilling losartan, I need to see him back in about a week or 2 to ensure his blood pressures come back down to normal, after which I can followup with him in 3-6 months afterwards.

## 2013-07-20 ENCOUNTER — Other Ambulatory Visit (HOSPITAL_COMMUNITY): Payer: 59 | Admitting: Psychiatry

## 2013-07-20 ENCOUNTER — Other Ambulatory Visit (HOSPITAL_COMMUNITY): Payer: 59

## 2013-07-20 DIAGNOSIS — F419 Anxiety disorder, unspecified: Secondary | ICD-10-CM

## 2013-07-20 DIAGNOSIS — F331 Major depressive disorder, recurrent, moderate: Secondary | ICD-10-CM

## 2013-07-20 NOTE — Progress Notes (Signed)
    Daily Group Progress Note  Program: IOP  Group Time: 9:00-10:30 am   Participation Level: Active  Behavioral Response: Appropriate  Type of Therapy:  Process Group  Summary of Progress: Pt continues to present with high anxiety symptoms and struggles with driving himself to the group. Pts wife is currently driving him. Pt is struggling with being able to manage his anxiety symptoms and appears frustrated with himself that he can't stabilize it. Pt said he would like to explore medications but would feel more comfortable talking with his primary psychiatrist. Pt is working on learning how to use the heartmath biofeedback technique to reduce anxiety symptoms. Pt has requested additional days.      Group Time: 10:30 am - 12:00 pm   Participation Level:  Active  Behavioral Response: Appropriate  Type of Therapy: Psycho-education Group  Summary of Progress: Pt learned about the symptoms of depression and how to identify them to reduce symptoms and identify increases in symptoms.    Carman Ching, LCSW

## 2013-07-21 ENCOUNTER — Other Ambulatory Visit (HOSPITAL_COMMUNITY): Payer: 59 | Admitting: Psychiatry

## 2013-07-21 DIAGNOSIS — F419 Anxiety disorder, unspecified: Secondary | ICD-10-CM

## 2013-07-21 DIAGNOSIS — F331 Major depressive disorder, recurrent, moderate: Secondary | ICD-10-CM
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
## 2013-07-21 NOTE — Progress Notes (Signed)
    Daily Group Progress Note  Program: IOP  Group Time: 9:00-10:30 am   Participation Level: Active  Behavioral Response: Appropriate  Type of Therapy:  Process Group  Summary of Progress: Pt reports feeling less anxious and presents with calmer affect and behaviors. Pt requested additonal time in the group to work on anxiety symptoms and said he wants to learn how to use biofeedback to manage symptoms.      Group Time: 10:30 am - 12:00 pm   Participation Level:  Active  Behavioral Response: Appropriate  Type of Therapy: Psycho-education Group  Summary of Progress: Pt learned about anxiety symptoms and how to identify them to reduce severity.   Carman Ching, LCSW

## 2013-07-22 ENCOUNTER — Other Ambulatory Visit (HOSPITAL_COMMUNITY): Payer: 59 | Admitting: Psychiatry

## 2013-07-22 DIAGNOSIS — F419 Anxiety disorder, unspecified: Secondary | ICD-10-CM

## 2013-07-22 DIAGNOSIS — F331 Major depressive disorder, recurrent, moderate: Secondary | ICD-10-CM

## 2013-07-22 NOTE — Progress Notes (Signed)
    Daily Group Progress Note  Program: IOP  Group Time: 9:00-10:30 am   Participation Level: Active  Behavioral Response: Appropriate  Type of Therapy:  Process Group  Summary of Progress: Pt reports improved anxiety, but still presents as anxious. Pt worked on learning how to change his fast paced breathing into deeper diaphragm breathing in an attempt to manage his anxiety symptoms. Pt was struggling with deepening his breath but was making progress and reported a reduction in anxiety symptoms as he mastered the technique.      Group Time: 10:30 am - 12:00 pm   Participation Level:  Active  Behavioral Response: Appropriate  Type of Therapy: Psycho-education Group  Summary of Progress:  Pt practiced using the breathing technique - heartmath - to manage anxiety and stress.   Carman Ching, LCSW

## 2013-07-25 ENCOUNTER — Other Ambulatory Visit (HOSPITAL_COMMUNITY): Payer: 59 | Admitting: Psychiatry

## 2013-07-25 DIAGNOSIS — F331 Major depressive disorder, recurrent, moderate: Secondary | ICD-10-CM

## 2013-07-25 DIAGNOSIS — F419 Anxiety disorder, unspecified: Secondary | ICD-10-CM

## 2013-07-25 NOTE — Progress Notes (Signed)
    Daily Group Progress Note  Program: IOP  Group Time: 9:00-10:30 am   Participation Level: Active  Behavioral Response: Appropriate  Type of Therapy:  Process Group  Summary of Progress: Pt was quiet but attentive. He reports moderate anxiety today. Pt was interactive in others discussions and was using some humor, which is a sign for him that he is improving, but he did not provide a personal update on how he is doing with managing anxiety and depression symptoms. Pt will be encouraged to share more on a personal level in addition to being supportive of others.      Group Time: 10:30 am - 12:00 pm   Participation Level:  Active  Behavioral Response: Appropriate  Type of Therapy: Psycho-education Group  Summary of Progress: Pt participated in a group with a focus on grief and loss and identified current losses impacted overall wellness and effective grieving strategies.   Carman Ching, LCSW

## 2013-07-26 ENCOUNTER — Encounter: Payer: Self-pay | Admitting: Sports Medicine

## 2013-07-26 ENCOUNTER — Other Ambulatory Visit (HOSPITAL_COMMUNITY): Payer: 59 | Admitting: Psychiatry

## 2013-07-26 ENCOUNTER — Ambulatory Visit (INDEPENDENT_AMBULATORY_CARE_PROVIDER_SITE_OTHER): Payer: 59 | Admitting: Sports Medicine

## 2013-07-26 VITALS — BP 148/91 | HR 84 | Wt 286.0 lb

## 2013-07-26 DIAGNOSIS — I10 Essential (primary) hypertension: Secondary | ICD-10-CM

## 2013-07-26 DIAGNOSIS — F331 Major depressive disorder, recurrent, moderate: Secondary | ICD-10-CM

## 2013-07-26 DIAGNOSIS — F419 Anxiety disorder, unspecified: Secondary | ICD-10-CM

## 2013-07-26 DIAGNOSIS — F411 Generalized anxiety disorder: Secondary | ICD-10-CM

## 2013-07-26 MED ORDER — LOSARTAN POTASSIUM-HCTZ 50-12.5 MG PO TABS: 1.0000 | ORAL_TABLET | Freq: Every day | ORAL | Status: DC

## 2013-07-26 NOTE — Assessment & Plan Note (Signed)
Somewhat anxious today. I've asked him to go ahead and take another Klonopin.

## 2013-07-26 NOTE — Progress Notes (Signed)
  Subjective:    CC: Followup  HPI: Hypertension: Improved significantly on losartan 50. Has a mild headache today, blood pressure is elevated. Has had a few readings in the 117 range at home.  Anxiety: Slightly more than usual today he took a single Klonopin this morning which was ineffective but did not know that he can take more.  Past medical history, Surgical history, Family history not pertinant except as noted below, Social history, Allergies, and medications have been entered into the medical record, reviewed, and no changes needed.   Review of Systems: No fevers, chills, night sweats, weight loss, chest pain, or shortness of breath.   Objective:    General: Well Developed, well nourished, and in no acute distress.  Neuro: Alert and oriented x3, extra-ocular muscles intact, sensation grossly intact.  HEENT: Normocephalic, atraumatic, pupils equal round reactive to light, neck supple, no masses, no lymphadenopathy, thyroid nonpalpable.  Skin: Warm and dry, no rashes. Cardiac: Regular rate and rhythm, no murmurs rubs or gallops, no lower extremity edema.  Respiratory: Clear to auscultation bilaterally. Not using accessory muscles, speaking in full sentences.  Impression and Recommendations:

## 2013-07-26 NOTE — Progress Notes (Signed)
    Daily Group Progress Note  Program: IOP  Group Time: 9:00-10:30 am   Participation Level: Active  Behavioral Response: Appropriate  Type of Therapy:  Process Group  Summary of Progress: Pt reports mild anxiety symptoms and his anxiety continues to improve. Pt is using humor more frequently and talked about the support from his wife in helping him manage his symptoms. Pt said his anxiety became out of control about a year ago after he became aware of his daughters drug addiction. Pt talked about the fear he has about her dying from overdose and how he holds onto that fear and helplessness over not being able to protect her. Pt is aware that he needs to let go of the "fear" to have some relief from his anxiety. Pt is practicing his heartmath daily and said it is helping reduce anxiety symptoms.      Group Time: 10:30 am - 12:00 pm   Participation Level:  Active  Behavioral Response: Appropriate  Type of Therapy: Psycho-education Group  Summary of Progress: Pt explored unhealthy ways of coping with difficult emotions that create further chaos to identify how they are unhealthy as a lead into learning healthy coping strategies.  Carman Ching, LCSW

## 2013-07-26 NOTE — Assessment & Plan Note (Signed)
Blood pressure is still elevated. Switching to a losartan hydrochlorothiazide combination.

## 2013-07-27 ENCOUNTER — Other Ambulatory Visit (HOSPITAL_COMMUNITY): Payer: 59 | Admitting: Psychiatry

## 2013-07-27 DIAGNOSIS — F331 Major depressive disorder, recurrent, moderate: Secondary | ICD-10-CM

## 2013-07-27 NOTE — Progress Notes (Signed)
    Daily Group Progress Note  Program: IOP  Group Time: 9:00-10:30 am   Participation Level: Minimal  Behavioral Response: Appropriate  Type of Therapy:  Process Group  Summary of Progress: Pt reports being at the same level of mild anxiety, but was quiet today. He is still using humor, which is a sign of him feeling well, but he is being encouraged to share more about his own anxiety and the fears he has about his daughter. Pt said he continues to use heartmath three times daily and it seems to be helping reduce anxiety symptoms.      Group Time: 10:30 am - 12:00 pm   Participation Level:  Active  Behavioral Response: Appropriate  Type of Therapy: Psycho-education Group  Summary of Progress: Pt learned the DBT skill of Distress Tolerance and how to use ACCEPTS to manage difficult feelings.   Carman Ching, LCSW

## 2013-07-27 NOTE — Patient Instructions (Signed)
Patient will discharge from [facility]-IOP on 07-28-13.  Follow up with Dr. Hilton Cork on 08-03-13 @ 11a.m and Serafina Mitchell, [facility] on 08-19-13 @ 11a.m.  Encouraged support groups.

## 2013-07-28 ENCOUNTER — Other Ambulatory Visit (HOSPITAL_COMMUNITY): Payer: 59 | Admitting: Psychiatry

## 2013-07-28 DIAGNOSIS — F331 Major depressive disorder, recurrent, moderate: Secondary | ICD-10-CM

## 2013-07-28 NOTE — Progress Notes (Signed)
Patient ID: Stephen Myers, male   DOB: [birth year], 52 y.o.   MRN: [id number] D: This is a 52 married, caucasian male referred previously by therapist Serafina Mitchell, [facility]), treatment for depressive symptoms, panic attacks, and agoraphobia with SI (plan: Carbon monoxide or Overdose). Had been seeing Serafina Mitchell, [facility] and Dr. Hilton Cork since April 2014. Reported no prior suicide attempts or gestures. Currently denies any SI/HI or A/V hallucinations.  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 "It is tough getting out of my car. The bigger the store the more difficult." 2) 54 yo addict daughter. Hx of using heroin. Discovered she was using in May 2013. She was admitted at [facility]. Pt states she's currently not using heroin, but is abusing only Benzodiazepines. According to pt, she (daughter) stole his Klonopin a couple of months ago. Pt also mentioned that his daughter's boyfriend overdosed (heroin) and died last year.  Pt was admitted on the inpatient psychiatric unit on 07-07-13 due to SI. Transitioned from the inpatient unit back to MH-IOP on 07-13-13.  Completed MH-IOP today.  Continues to struggle with anxiety symptoms and poor sleep.  "I think I have built up a tolerance to my medications." Reports improved depressive symptoms.  States that the groups were helpful, along with being admitted on the inpatient unit.  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  A:  D/C today.  F/U with Dr. Hilton Cork on 08-03-13 and Serafina Mitchell on 08-19-13.  Encouraged support groups and maintaining structure.  R:  Pt receptive.

## 2013-07-28 NOTE — Progress Notes (Signed)
    Daily Group Progress Note  Program: IOP  Group Time: 9:00-10:30 am   Participation Level: Active  Behavioral Response: Appropriate  Type of Therapy:  Process Group  Summary of Progress: Today was Pts final day in the group. He participated in a goodbye ceremony and said he was grateful for this experience because it helped reduce his depression and anxiety. Pt said the heartmath breathing helped him the most and learning how to harness his breath to decrease anxiety.He also said it helped to hear others stories and not feel "alone" in his struggles. Pt reports a decrease in depression and anxiety symptoms and said he is ready to return to his individual therapist.      Group Time: 10:30 am - 12:00 pm   Participation Level:  Active  Behavioral Response: Appropriate  Type of Therapy: Psycho-education Group  Summary of Progress: Pt listened to a presentation from the [employer] and learned about supportive mental health resources that can be utilized for ongoing support.   Carman Ching, LCSW

## 2013-07-28 NOTE — Progress Notes (Signed)
Patient ID: [name], male   DOB: 22-Jun-1961, 53 y.o.   MRN: [id number]  Discharge Note  Patient: Stephen Myers DOB: [birth year]  Date of Admission: 07/13/2013 Date of Discharge: 07/28/2013  Reason for Admission: Transitioned back into the intensive outpatient program after having been hospitalized on the adult unit of [facility] due to increased depression, panic attacks, and suicidal thoughts while attending IOP.   Hospital course: [name] returned to the intensive outpatient program after being discharged from the inpatient unit where his medications were adjusted. He reports that the inpatient experience was very helpful and he is feeling significantly better than he was when we discharged him to go inpatient on 07/07/2013. He quickly assimilated back into the group, and shared openly about his feelings of depression and anxiety, as well as his life stressors. At discharge she reported that his participation in the program was very beneficial. He especially finds the heart math biofeedback exercises very helpful, and he is able to use those at home to calm himself. He complains that the Klonopin which was increased when he was inpatient is wearing off around 3:00 in the afternoon. He reports that he is sleeping well with the Klonopin and trazodone at nighttime. He denies any suicidal or homicidal ideation. He denies any auditory or visual hallucinations.  Mental status at discharge  well-nourished and well-developed, alert and oriented, mildly anxious with a congruent affect, good eye contact, speech is clear coherent, cognitive functioning is mildly delayed, memory is mildly impaired, concentration intact, insight and judgment are fair, no outward signs of psychosis or suicidality.  Lab Results: No results found for this or any previous visit (from the past 48 hour(s)).   Discharge prescriptions:  no prescriptions were given at discharge but his discharge medications are as  follows: Abilify 10 mg at bedtime, Klonopin 1 mg twice daily, lithium carbonate 600 mg at bedtime, trazodone 50 mg at bedtime, naltrexone 50 mg daily.  Axis Diagnosis:  Axis I: Bipolar, Depressed, Major Depression, Recurrent severe, Panic Disorder and Post Traumatic Stress Disorder  Axis II:  deferred Axis III:  noncontributory Axis IV:  moderate Axis V: 60  Level of Care: OP  Discharge destination: Home  Is patient on multiple antipsychotic therapies at discharge: No  Has Patient had three or more failed trials of antipsychotic monotherapy by history: No  Follow-up recommendations: Activity: As tolerated  Diet: Regular   Follow-up appointments:  Dr. Hilton Cork 08/03/2013 11 AM     Serafina Mitchell 08/19/2013 at 11 AM  Comments:    The patient received suicide prevention pamphlet: Yes  Guadlupe Spanish. Nikeya Maxim, MHS, PA-C

## 2013-07-28 NOTE — Patient Instructions (Signed)
Patient completed MH-IOP today.  Will follow up with Dr. Hilton Cork on 08-03-13 @ 11a.m and Serafina Mitchell, [facility]  On 08-19-13 @ 11 a.m.  Encouraged support groups.

## 2013-07-29 ENCOUNTER — Other Ambulatory Visit (HOSPITAL_COMMUNITY): Payer: 59

## 2013-08-01 ENCOUNTER — Other Ambulatory Visit (HOSPITAL_COMMUNITY): Payer: 59

## 2013-08-02 ENCOUNTER — Other Ambulatory Visit (HOSPITAL_COMMUNITY): Payer: 59

## 2013-08-03 ENCOUNTER — Other Ambulatory Visit (HOSPITAL_COMMUNITY): Payer: 59

## 2013-08-03 ENCOUNTER — Ambulatory Visit (HOSPITAL_COMMUNITY): Payer: Self-pay | Admitting: Psychiatry

## 2013-08-05 ENCOUNTER — Encounter (HOSPITAL_COMMUNITY): Payer: Self-pay | Admitting: Psychiatry

## 2013-08-05 ENCOUNTER — Ambulatory Visit (INDEPENDENT_AMBULATORY_CARE_PROVIDER_SITE_OTHER): Payer: 59 | Admitting: Psychiatry

## 2013-08-05 VITALS — BP 137/82 | HR 86 | Ht 76.0 in | Wt 290.0 lb

## 2013-08-05 DIAGNOSIS — F4001 Agoraphobia with panic disorder: Secondary | ICD-10-CM

## 2013-08-05 DIAGNOSIS — F429 Obsessive-compulsive disorder, unspecified: Secondary | ICD-10-CM

## 2013-08-05 DIAGNOSIS — F4310 Post-traumatic stress disorder, unspecified: Secondary | ICD-10-CM

## 2013-08-05 MED ORDER — LITHIUM CARBONATE 600 MG PO CAPS: 600.0000 mg | ORAL_CAPSULE | Freq: Every day | ORAL | Status: DC

## 2013-08-05 MED ORDER — CLONAZEPAM 1 MG PO TABS: 1.0000 mg | ORAL_TABLET | Freq: Two times a day (BID) | ORAL | Status: DC

## 2013-08-05 MED ORDER — TRAZODONE HCL 50 MG PO TABS: 50.0000 mg | ORAL_TABLET | Freq: Every day | ORAL | Status: DC

## 2013-08-05 MED ORDER — ARIPIPRAZOLE 10 MG PO TABS: 10.0000 mg | ORAL_TABLET | Freq: Every day | ORAL | Status: DC

## 2013-08-05 NOTE — Progress Notes (Signed)
[facility] Follow-up Outpatient Visit  Stephen Myers 10-Dec-1960  Date: 08/05/2013  HISTORY OF CURRENT ILLNESS:   Stephen Myers is a 52 Y/O male with a past psychiatric history significant for Major Depression, recurrent, moderate. Obsessive Compulsive Disorder, Anxiety Disorder, NOS. The patient is referred for psychiatric services for medication management.  The patient reports he has completed his IOP program. He continues to have anxiety first thing in the morning and again in the afternoon.  The patient reports that he feels better around his family, and therefore tends to stay at home.  He prefers to have his wife drive him most of the time. The patient reports he had a severe panic attack while in IOP that lead to an inpatient psychiatric admission secondary to suicidal ideation, with plans and intent.  His wife continues to do most of his IADL including balancing their checkbook, driving, and cooking.  The patient denies current suicidal ideation, intent, or plans. He report He  denies any current homicidal ideation, plans or intent. Patient denies auditory hallucinations. Patient denies visual hallucinations. The patient endorses continued  paranoia about  what his colleagues and supervisors are thinking about him.. Patient states sleep is increased with 9 hours per night, broken with waking up 2 times per night. Energy level is low.  The patient had panic attacks in the past week with his last panic attack two days ago.  Denies any recent episodes consistent with mania, particularly decreased need for sleep with increased energy, grandiosity, impulsivity, hyperverbal and pressured speech, or increased productivity. Denies any recent symptoms consistent with psychosis, particularly auditory or visual hallucinations, thought broadcasting/insertion/withdrawal, or ideas of reference.    Review of Systems  Constitutional: Negative for fever, chills and weight loss.  Respiratory:  Positive for shortness of breath (With panic attacks.). Negative for cough, sputum production and wheezing.   Cardiovascular: Negative for chest pain, palpitations and leg swelling.  Gastrointestinal: Negative for nausea, vomiting, abdominal pain, diarrhea and constipation.  Musculoskeletal:       No gait abnormalities or muscle weakness.  Neurological: Positive for sensory change (Burning sensation in leg. ).   Filed Vitals:   08/05/13 1306  BP: 137/82  Pulse: 86  Height: 6\' 4"  (1.93 m)  Weight: 290 lb (131.543 kg)    Physical Exam  Constitutional: He appears well-developed and well-nourished. No acute physical distress. Patient endorses hopelessness, helplessness and guilt.  Skin: He is not diaphoretic. Musculoskeletal: Strength & Muscle Tone: within normal limits Gait & Station: normal Patient leans: N/A'    HISTORY OF SUICIDAL ACTS AND SELF-HARM:  None.   HISTORY OF VIOLENCE/ASSAULTING OTHERS/LEGAL PROBLEMS:  No current legal issues.   SUBSTANCE USE HISTORY:  Caffeine: Coffee 2 cups per day. Caffeinated Beverages (green tea) 16 ounces per day.  Nicotine: Patient denies.  Alcohol:  Patient reports 3 drinks last weekend. Drugs: Patient denies.   MENTAL ILLNESS AND SUBSTANCE ABUSE IN FAMILY MEMBERS: Reviewed  Psychiatric illness: Father-Bipolar Disorder. Mother-depression.  Substance abuse: Paternal-Alcoholism  Suicides: Patient denied   MILITARY HISTORY:  [employer]   Allergies: No Known Allergies   MEDICAL INFORMATION  Past Medical History  Diagnosis Date  . Irritability and anger     symptoms at home and work  . Anxiety   . Depression    b) CURRENT PRESCRIBED MEDICATIONS:  Current Outpatient Prescriptions on File Prior to Visit  Medication Sig Dispense Refill  . ARIPiprazole (ABILIFY) 10 MG tablet Take 1 tablet (10 mg total) by mouth at  bedtime. For adjuvant therapy for depression.  30 tablet  0  . aspirin 81 MG tablet Take 1 tablet (81 mg total) by mouth  daily.  30 tablet    . atorvastatin (LIPITOR) 40 MG tablet Take 1 tablet (40 mg total) by mouth daily. For cholesterol.      . clonazePAM (KLONOPIN) 1 MG tablet Take 1 tablet (1 mg total) by mouth 2 (two) times daily. For anxiety.  60 tablet  0  . lithium carbonate 600 MG capsule Take 1 capsule (600 mg total) by mouth at bedtime. For mood stabilization.  30 capsule  0  . losartan-hydrochlorothiazide (HYZAAR) 50-12.5 MG per tablet Take 1 tablet by mouth daily.  30 tablet  1  . naltrexone (DEPADE) 50 MG tablet Take 1 tablet (50 mg total) by mouth daily. For adjunctive therapy for depression.      . traZODone (DESYREL) 50 MG tablet Take 1 tablet (50 mg total) by mouth at bedtime. For insomnia.  30 tablet  0  . vitamin B-12 (CYANOCOBALAMIN) 500 MCG tablet Take 2 tablets (1,000 mcg total) by mouth daily. For low iron deficiency.      . [DISCONTINUED] clomiPRAMINE (ANAFRANIL) 25 MG capsule Take 1 capsule (25 mg total) by mouth at bedtime.  30 capsule  0   No current facility-administered medications on file prior to visit.    e) CURRENT OTC MEDICATIONS  1)  Vit B   ALLERGIES/ ADVERSE REACTIONS:  1) None   Psychiatric Specialty Examination:  Objective: Appearance: Fairly Groomed, Patient is tearful through the interview.  Eye Contact: fair   Speech: Nonrmal  Volume: Normal   Mood: "dark"   Depression: 3-4/10 (0=Very depressed; 5=Neutral; 10=Very Happy)  Anxiety- 5/10 (0=no anxiety; 5= moderate/tolerable anxiety; 10= panic attacks)  Affect: Full range, congruent   Thought Process: Coherent and Goal Directed   Orientation: Oriented to person, place, year, and month, got date wrong.  Thought Content: Within normal limits with the exception of paranoia regarding people taking advantage of him.   Suicidal Thoughts: Patient denies suicidal ideation, intent or plans today.   Homicidal Thoughts: No   Memory: Immediate; Good  Recent:   Judgement: Good   Insight: Fair   Psychomotor Activity:  Normal   Concentration:Fair   Memory: intact immediate 3/3; impaired recent 1/3   Akathisia: No   Handed: Right   AIMS (if indicated): not indicates   Assets:  Desire for Improvement  Financial Resources/Insurance  Housing  Social Support    Lab Results: No results found for this or any previous visit (from the past 48 hour(S)).   SUMMARY AND FORMULATION:   ASSESSMENT:  Axis I: Post Traumatic Stress disorder, Panic Disorder with Agoraphobia, Obsessive Compulsive Disorder, Rule out Bipolar II Disorder  Axis II: No diagnosis.  Axis III: No diagnosis  Axis IV: Job related stressors, familial stressors.  Axis V: GAF: 40  PLAN:  1. Affirm with the patient that the medications are taken as ordered. Patient expressed understanding of how their medications were to be used.  2. Continue the following psychiatric medications as written prior to this appointment with the following changes:  A) Continue naltrexone 50 mg daily B) Will continue clonazepam 1 mg BID C) Will continue Abilify to 10 mg daily. Will consider increase in this medication at next visit. D) Continue Lithium 600 mg E) Given his current severity of symptoms will continue to recommend short term disability with the possibility of long term disability. 3. Therapy: brief supportive therapy  provided. Discussed psychosocial stressors. Continue individual outpatient therapy.  Continue EMDR therapy. 4. Risks and benefits, side effects and alternatives discussed with patient, he was given an opportunity to ask questions about his medication, illness, and treatment. All current psychiatric medications have been reviewed and discussed with the patient and adjusted as clinically appropriate. The patient has been provided an accurate and updated list of the medications being now prescribed.  5. Patient told to call clinic if any problems occur. Patient advised to go to ER if he should develop SI/HI, side effects, or if symptoms worsen.  Has crisis numbers to call if needed.  He has a safety plan in place. He will be with his wife.  6. Will order Lithium Level, BUN, Creatinine, TSH at next appointment. 7. The patient was encouraged to keep PCP appointment, secondary to blood pressure and start medications as advised by PCP.  8. Patient was instructed to return to clinic in 2 weeks.  9. The patient expressed understanding of the plan outlined above and agrees with the plan.   Jacqulyn Cane, M.D.  08/05/2013 1:00 PM

## 2013-08-09 ENCOUNTER — Encounter: Payer: Self-pay | Admitting: Sports Medicine

## 2013-08-09 ENCOUNTER — Encounter (HOSPITAL_COMMUNITY): Payer: Self-pay | Admitting: Behavioral Health

## 2013-08-09 ENCOUNTER — Ambulatory Visit (INDEPENDENT_AMBULATORY_CARE_PROVIDER_SITE_OTHER): Payer: 59 | Admitting: Sports Medicine

## 2013-08-09 ENCOUNTER — Ambulatory Visit (INDEPENDENT_AMBULATORY_CARE_PROVIDER_SITE_OTHER): Payer: 59 | Admitting: Behavioral Health

## 2013-08-09 VITALS — BP 127/80 | HR 84 | Wt 290.0 lb

## 2013-08-09 DIAGNOSIS — I10 Essential (primary) hypertension: Secondary | ICD-10-CM

## 2013-08-09 DIAGNOSIS — F411 Generalized anxiety disorder: Secondary | ICD-10-CM

## 2013-08-09 DIAGNOSIS — E785 Hyperlipidemia, unspecified: Secondary | ICD-10-CM

## 2013-08-09 DIAGNOSIS — F331 Major depressive disorder, recurrent, moderate: Secondary | ICD-10-CM

## 2013-08-09 NOTE — Assessment & Plan Note (Signed)
Well-controlled on current medications, happy, no changes. He will followup with psychiatry.

## 2013-08-09 NOTE — Assessment & Plan Note (Signed)
Well controlled, no changes 

## 2013-08-09 NOTE — Progress Notes (Signed)
  Subjective:    CC: Followup  HPI: Hypertension: Well controlled.  Hyperlipidemia: Well controlled.  Depression: Well controlled with current medications, happy, no changes, he does followup with psychiatry.  Past medical history, Surgical history, Family history not pertinant except as noted below, Social history, Allergies, and medications have been entered into the medical record, reviewed, and no changes needed.   Review of Systems: No fevers, chills, night sweats, weight loss, chest pain, or shortness of breath.   Objective:    General: Well Developed, well nourished, and in no acute distress.  Neuro: Alert and oriented x3, extra-ocular muscles intact, sensation grossly intact.  HEENT: Normocephalic, atraumatic, pupils equal round reactive to light, neck supple, no masses, no lymphadenopathy, thyroid nonpalpable.  Skin: Warm and dry, no rashes. Cardiac: Regular rate and rhythm, no murmurs rubs or gallops, no lower extremity edema.  Respiratory: Clear to auscultation bilaterally. Not using accessory muscles, speaking in full sentences.  Impression and Recommendations:

## 2013-08-09 NOTE — Progress Notes (Signed)
   THERAPIST PROGRESS NOTE  Session Time: 1:00  Participation Level: Active  Behavioral Response: CasualAlertAnxious  Type of Therapy: Individual Therapy  Treatment Goals addressed: Coping  Interventions: CBT  Summary: Jibran Crookshanks is a 52 y.o. male who presents with anxiety.   Suicidal/Homicidal: Nowithout intent/plan  Therapist Response: This was the first time that I had seen the client since intensive outpatient therapy and in patient hospitalization. The client did say that were benefits from both the outpatient therapy and the hospitalization. The client still with some anxiety for him being a part of the groups but indicated that it was comfort and knowing that others are struggling with anxiety and depression as he is. We processed the type of coping skills that he learned. He does contract for safety saying he has no thoughts of hurting himself or anyone else. He is currently still struggling with the anxiety related to waiting on her decision from his insurance company about short-term disability. We talked about ways of" getting out of his head" in terms of over focus on his insurance companies decision. He also reports that he is becoming more agoraphobic not wanting to go anywhere by himself. He will go out with his wife for brothers were children. For homework I asked the client to begin sitting on his back porch initially for 15 minutes twice a day expanding that over the course of the next week. If he does well with that we will attempt to get him more outside of the home into his yard. I feel he will do better if he is allowed to do this activity gradually.  Plan: Return again in 1 weeks.  Diagnosis: Axis I: 313    Axis II: Deferred    Elicia Lui M, LPC 08/09/2013

## 2013-08-19 ENCOUNTER — Encounter (HOSPITAL_COMMUNITY): Payer: Self-pay | Admitting: Behavioral Health

## 2013-08-19 ENCOUNTER — Ambulatory Visit (INDEPENDENT_AMBULATORY_CARE_PROVIDER_SITE_OTHER): Payer: 59 | Admitting: Behavioral Health

## 2013-08-19 DIAGNOSIS — F411 Generalized anxiety disorder: Secondary | ICD-10-CM

## 2013-08-19 DIAGNOSIS — F321 Major depressive disorder, single episode, moderate: Secondary | ICD-10-CM

## 2013-08-19 NOTE — Progress Notes (Signed)
   THERAPIST PROGRESS NOTE  Session Time: 11:00  Participation Level: Active  Behavioral Response: CasualAlertAnxious  Type of Therapy: Individual Therapy  Treatment Goals addressed: Coping  Interventions: CBT  Summary: Stephen Myers is a 52 y.o. male who presents with anxiety and depression.   Suicidal/Homicidal: Nowithout intent/plan  Therapist Response: The client used the word" winning" for the first time in months. He said that he felt he is making slow but steady progress in terms of reducing social anxiety and knowing that he will be getting short-term disability has made his depression and anxiety more manageable. He appeared is more bright often smiling. His voice was not nearly as shaky he was able to not have as many verbal gaps in his conversation. The client stated that he felt that his wife lost her job because of the work he is putting in and the effects of his medication he stated that he feels both he and his wife for handling it well.  I gave him a homework of driving to a store that he is comfortable with. I told him that he could not get out he can try back home didn't want him to do a few days later an attempt to get out and go into a store. He agreed to do that. The client does contract for safety saying he has no thoughts of hurting himself or anyone else.  Plan: Return again in 1 weeks.  Diagnosis: Axis I: 313    Axis II: Deferred    Inocencio Roy M, LPC 08/19/2013

## 2013-08-22 ENCOUNTER — Other Ambulatory Visit (HOSPITAL_COMMUNITY): Payer: Self-pay | Admitting: Psychiatry

## 2013-08-26 ENCOUNTER — Ambulatory Visit (INDEPENDENT_AMBULATORY_CARE_PROVIDER_SITE_OTHER): Payer: 59 | Admitting: Behavioral Health

## 2013-08-26 ENCOUNTER — Encounter (HOSPITAL_COMMUNITY): Payer: Self-pay | Admitting: Behavioral Health

## 2013-08-26 DIAGNOSIS — F4310 Post-traumatic stress disorder, unspecified: Secondary | ICD-10-CM

## 2013-08-26 NOTE — Progress Notes (Signed)
   THERAPIST PROGRESS NOTE  Session Time: 1:00  Participation Level: Active  Behavioral Response: CasualAlertAnxious  Type of Therapy: Individual Therapy  Treatment Goals addressed: Coping  Interventions: CBT  Summary: Stephen Myers is a 52 y.o. male who presents with anxiety.   Suicidal/Homicidal: Nowithout intent/plan  Therapist Response: The client did his homework indicating that he went not only to one store but to 3 stores including [employer] and [employer] actually going and to buy a product. He does report some anxiety associated with that but was very proud of the fact that he had done that. He stated that mentally he told himself that right now his job is getting better and he wants to do his job well. For homework this week he will go to [employer] and at minimum pull into his parking space. I told him that if he felt confident enough that he could alter the front of the store and he felt incredibly comfortable he could walk to the section that sells dollar items. He did report some anxiety with thinking about that but recognizes that he needs to do that in terms of healing.  There is some anxiety in waiting to hear from long-term disability. He did speak with the representative from the insurance company last week telling the patient that he should or something from him soon. We talked about compartmentalizing so that he does not become overwhelmed in waiting with her answer. We also talked about steps he can take if they chose not to offer long-term disability. The client does contract for safety saying he has no thoughts of hurting himself or anyone else.  Plan: Return again in 1 weeks.  Diagnosis: Axis I: 300.02    Axis II: Deferred    Laurren Lepkowski M, LPC 08/26/2013

## 2013-09-02 ENCOUNTER — Ambulatory Visit (INDEPENDENT_AMBULATORY_CARE_PROVIDER_SITE_OTHER): Payer: 59 | Admitting: Behavioral Health

## 2013-09-02 ENCOUNTER — Encounter (HOSPITAL_COMMUNITY): Payer: Self-pay | Admitting: Behavioral Health

## 2013-09-02 DIAGNOSIS — F411 Generalized anxiety disorder: Secondary | ICD-10-CM

## 2013-09-02 NOTE — Progress Notes (Signed)
   THERAPIST PROGRESS NOTE  Session Time: 11:00  Participation Level: Active  Behavioral Response: CasualAlertAnxious  Type of Therapy: Individual Therapy  Treatment Goals addressed: Coping  Interventions: CBT  Summary: Stephen Myers is a 52 y.o. male who presents with anxiety.   Suicidal/Homicidal: Nowithout intent/plan  Therapist Response: The client continues to present with mild to moderate anxiety but it is clear that he is working hard to stretch his comfort zone in areas that the increased anxiety for him. He said that he was able to drive to [employer] and pull into a parking space about halfway to the building. He initially looked at as a failure but I told him our goal was to get him to that store and I getting there was a victory. I'm thinking to get him to re frame his thought process. I feel that he is focused so he set as a goal getting in to that store between now and the next session. We talked about ways that might make it easier leading up to and after getting into the store.  He does report some anxiety waiting to hear about long-term disability but states that it is manageable. He does contract for safety saying he has minimal thoughts of hurting himself and minimal suicidal thoughts. He does start to see some small decrease in the progress that he is making so I encouraged that and we will continue to build on that in the next session.  Plan: Return again in 1 weeks.  Diagnosis: Axis I: 313    Axis II: Deferred    Kenai Fluegel M, LPC 09/02/2013

## 2013-09-03 ENCOUNTER — Other Ambulatory Visit (HOSPITAL_COMMUNITY): Payer: Self-pay | Admitting: Psychiatry

## 2013-09-04 ENCOUNTER — Other Ambulatory Visit (HOSPITAL_COMMUNITY): Payer: Self-pay | Admitting: Psychiatry

## 2013-09-05 ENCOUNTER — Encounter (HOSPITAL_COMMUNITY): Payer: Self-pay | Admitting: Psychiatry

## 2013-09-05 ENCOUNTER — Ambulatory Visit (INDEPENDENT_AMBULATORY_CARE_PROVIDER_SITE_OTHER): Payer: 59 | Admitting: Psychiatry

## 2013-09-05 VITALS — BP 130/80 | HR 72 | Ht 76.0 in | Wt 292.0 lb

## 2013-09-05 DIAGNOSIS — F4310 Post-traumatic stress disorder, unspecified: Secondary | ICD-10-CM

## 2013-09-05 DIAGNOSIS — F429 Obsessive-compulsive disorder, unspecified: Secondary | ICD-10-CM

## 2013-09-05 DIAGNOSIS — F4001 Agoraphobia with panic disorder: Secondary | ICD-10-CM

## 2013-09-05 MED ORDER — NALTREXONE HCL 50 MG PO TABS: 50.0000 mg | ORAL_TABLET | Freq: Every day | ORAL | Status: DC

## 2013-09-05 MED ORDER — LITHIUM CARBONATE 600 MG PO CAPS: 600.0000 mg | ORAL_CAPSULE | Freq: Every day | ORAL | Status: DC

## 2013-09-05 MED ORDER — ARIPIPRAZOLE 10 MG PO TABS: 10.0000 mg | ORAL_TABLET | Freq: Every day | ORAL | Status: DC

## 2013-09-05 MED ORDER — CLONAZEPAM 1 MG PO TABS: 1.0000 mg | ORAL_TABLET | Freq: Two times a day (BID) | ORAL | Status: DC

## 2013-09-05 NOTE — Progress Notes (Signed)
[facility] Behavioral Health Follow-up Outpatient Visit  Stephen Myers [birth year]  Date: 09/05/2013  HISTORY OF CURRENT ILLNESS:   Stephen Myers is a 52 Y/O male with a past psychiatric history significant for Major Depression, recurrent, moderate. Obsessive Compulsive Disorder, Anxiety Disorder, NOS. The patient is referred for psychiatric services for medication management.  The patient continues have thoughts that he would be better off if he were not alive, but no active suicidal thoughts.  His wife continues to do most of his IADL including balancing their checkbook, driving, and cooking. He has been staying at home for the most of the time.  He is working with his therapist about exposing himself to more stressors as his agoraphobia has been worsening. He has been having some leg pain on his right side throughout the day, which will speak to his PCP about. He has been having some problems with his memory, including walking into a room and forgetting what he went in for.  He also has difficulty maintaining conversations as he will forget what he was talking about.   The patient denies current suicidal ideation, intent, or plans. He  denies any current homicidal ideation, plans or intent. Patient denies auditory hallucinations. Patient denies visual hallucinations. The patient endorses continued paranoia about what people think about him.. Patient states sleep is increased with 9 hours per night, broken with waking up 2 times per night. Energy level is low and he has stopped excercising.  The patient had panic attacks in the past week with his last panic attack two days ago.  Denies any recent episodes consistent with mania, particularly decreased need for sleep with increased energy, grandiosity, impulsivity, hyperverbal and pressured speech, or increased productivity. Denies any recent symptoms consistent with psychosis, particularly auditory or visual hallucinations, thought  broadcasting/insertion/withdrawal, or ideas of reference.    Review of Systems  Constitutional: Negative for fever, chills and weight loss.  Respiratory: Positive for shortness of breath (With panic attacks.). Negative for cough, sputum production and wheezing.   Cardiovascular: Negative for chest pain, palpitations and leg swelling.  Gastrointestinal: Negative for nausea, vomiting, abdominal pain, diarrhea and constipation.  Musculoskeletal:       No gait abnormalities or muscle weakness.  Neurological: Positive for sensory change (Burning sensation in leg. ).   Filed Vitals:   09/05/13 1115  BP: 130/80  Pulse: 72  Height: 6\' 4"  (1.93 m)  Weight: 292 lb (132.45 kg)    Physical Exam  Constitutional: He appears well-developed and well-nourished. No acute physical distress. Patient endorses hopelessness, helplessness and guilt.  Skin: He is not diaphoretic. Musculoskeletal: Strength & Muscle Tone: within normal limits Gait & Station: normal Patient leans: N/A'    HISTORY OF SUICIDAL ACTS AND SELF-HARM:  None.   HISTORY OF VIOLENCE/ASSAULTING OTHERS/LEGAL PROBLEMS:  No current legal issues.   SUBSTANCE USE HISTORY:  Caffeine: Coffee 2 cups per day. Caffeinated Beverages (green tea) 16 ounces per day.  Nicotine: Patient denies.  Alcohol:  Patient reports 3 drinks last weekend. Drugs: Patient denies.   MENTAL ILLNESS AND SUBSTANCE ABUSE IN FAMILY MEMBERS: Reviewed  Psychiatric illness: Father-Bipolar Disorder. Mother-depression.  Substance abuse: Paternal-Alcoholism  Suicides: Patient denied   MILITARY HISTORY:  [employer]   Allergies: No Known Allergies   MEDICAL INFORMATION  Past Medical History  Diagnosis Date  . Irritability and anger     symptoms at home and work  . Anxiety   . Depression    b) CURRENT PRESCRIBED MEDICATIONS:  Current Outpatient Prescriptions  on File Prior to Visit  Medication Sig Dispense Refill  . ARIPiprazole (ABILIFY) 10 MG tablet Take  1 tablet (10 mg total) by mouth at bedtime. For adjuvant therapy for depression.  30 tablet  0  . aspirin 81 MG tablet Take 1 tablet (81 mg total) by mouth daily.  30 tablet    . atorvastatin (LIPITOR) 40 MG tablet Take 1 tablet (40 mg total) by mouth daily. For cholesterol.      . clonazePAM (KLONOPIN) 1 MG tablet Take 1 tablet (1 mg total) by mouth 2 (two) times daily. For anxiety.  60 tablet  0  . lithium 600 MG capsule Take 1 capsule (600 mg total) by mouth at bedtime. For mood stabilization.  30 capsule  0  . losartan-hydrochlorothiazide (HYZAAR) 50-12.5 MG per tablet Take 1 tablet by mouth daily.  30 tablet  1  . naltrexone (DEPADE) 50 MG tablet Take 1 tablet (50 mg total) by mouth daily. For adjunctive therapy for depression.      . traZODone (DESYREL) 50 MG tablet Take 1 tablet (50 mg total) by mouth at bedtime. For insomnia.  30 tablet  0  . vitamin B-12 (CYANOCOBALAMIN) 500 MCG tablet Take 2 tablets (1,000 mcg total) by mouth daily. For low iron deficiency.      . [DISCONTINUED] clomiPRAMINE (ANAFRANIL) 25 MG capsule Take 1 capsule (25 mg total) by mouth at bedtime.  30 capsule  0   No current facility-administered medications on file prior to visit.    e) CURRENT OTC MEDICATIONS  1)  Vit B   ALLERGIES/ ADVERSE REACTIONS:  1) None   Psychiatric Specialty Examination:  Objective: Appearance: Fairly Groomed, Patient is tearful through the interview.  Eye Contact: fair   Speech: Nonrmal  Volume: Normal   Mood: "anxious"   Depression: 5/10 (0=Very depressed; 5=Neutral; 10=Very Happy)  Anxiety- 7/10 (0=no anxiety; 5= moderate/tolerable anxiety; 10= panic attacks)  Affect: Full range, congruent   Thought Process: Coherent and Goal Directed   Orientation: Oriented to person, place, year, and month, got date wrong.  Thought Content: Within normal limits with the exception of paranoia regarding people taking advantage of him.   Suicidal Thoughts: Patient denies suicidal ideation,  intent or plans today.   Homicidal Thoughts: No   Memory: Immediate; Good  Recent:   Judgement: Good   Insight: Fair   Psychomotor Activity: Normal   Concentration:Fair   Memory: intact immediate 3/3; impaired recent 2/3   Akathisia: No   Handed: Right   AIMS (if indicated): not indicates   Assets:  Desire for Improvement  Financial Resources/Insurance  Housing  Social Support    Lab Results: No results found for this or any previous visit (from the past 48 hour(S)).   SUMMARY AND FORMULATION:   ASSESSMENT:  Axis I: Post Traumatic Stress disorder, Panic Disorder with Agoraphobia, Obsessive Compulsive Disorder, Rule out Bipolar II Disorder  Axis II: No diagnosis.  Axis III: No diagnosis  Axis IV: Job related stressors, familial stressors.  Axis V: GAF: 40  PLAN:  1. Affirm with the patient that the medications are taken as ordered. Patient expressed understanding of how their medications were to be used.  2. Continue the following psychiatric medications as written prior to this appointment with the following changes:  A) Continue naltrexone 50 mg daily B) Will continue clonazepam 1 mg BID C) Will continue Abilify to 10 mg daily. Will consider increase in this medication at next visit. D) Continue Lithium 600 mg E)  Trazodone 50 mg PRN E) Given his current severity of symptoms will recommend long term disability. 3. Therapy: brief supportive therapy provided. Discussed psychosocial stressors. Continue individual outpatient therapy.   Will refer to IOP if necessary based on symptoms.  4. Risks and benefits, side effects and alternatives discussed with patient, he was given an opportunity to ask questions about his medication, illness, and treatment. All current psychiatric medications have been reviewed and discussed with the patient and adjusted as clinically appropriate. The patient has been provided an accurate and updated list of the medications being now prescribed.  5.  Patient told to call clinic if any problems occur. Patient advised to go to ER if he should develop SI/HI, side effects, or if symptoms worsen. Has crisis numbers to call if needed.  He has a safety plan in place. He will be with his wife.  6. Reviewed labs,  7. The patient was encouraged to keep PCP appointment, secondary to blood pressure and start medications as advised by PCP.  8. Patient was instructed to return to clinic in 4 weeks.  9. The patient expressed understanding of the plan outlined above and agrees with the plan.   Jacqulyn Cane, M.D.  09/05/2013 11:10 AM

## 2013-09-09 ENCOUNTER — Ambulatory Visit (INDEPENDENT_AMBULATORY_CARE_PROVIDER_SITE_OTHER): Payer: 59 | Admitting: Behavioral Health

## 2013-09-09 ENCOUNTER — Telehealth (HOSPITAL_COMMUNITY): Payer: Self-pay | Admitting: Psychiatry

## 2013-09-09 ENCOUNTER — Encounter (HOSPITAL_COMMUNITY): Payer: Self-pay | Admitting: Behavioral Health

## 2013-09-09 DIAGNOSIS — F411 Generalized anxiety disorder: Secondary | ICD-10-CM

## 2013-09-09 NOTE — Telephone Encounter (Signed)
Patient provided letter that the patient's insurance would call me. [employer]. They requested last clinical note which was provided.

## 2013-09-09 NOTE — Progress Notes (Signed)
   THERAPIST PROGRESS NOTE  Session Time: 10:00  Participation Level: Active  Behavioral Response: CasualAlertAnxious  Type of Therapy: Individual Therapy  Treatment Goals addressed: Coping  Interventions: CBT  Summary: Lavance Beazer is a 52 y.o. male who presents with anxiety.   Suicidal/Homicidal: Nowithout intent/plan  Therapist Response: The client presented with sadness and was tearful as he indicated that his mother-in-law had only given a few days left to live. He indicated that there have been health issues but this had become fairly sudden and stated that he wondered when things were going to stop happening to them. We were able to reframed it saying that she has led a good life and they saw her health issues have exceeded with doctors expected. He was more concerned about his wife saying she was handling enough with his issues, losing her job, now losing her mother. We talked about ways he could be strong and comfort her.  He did say that he was able to go to the retailer into in the store to this boarding gets apartment and walked out. He was proud of himself and I was very proud of him. I asked him to suggest what he thought he could do next. He wants to try to at least drive to [employer] and sit in the parking lot. I encouraged him to do that if he fell into a more to do so. He did say that he drove to the parking lot of his church and that created some anxiety but felt most of that was because he has not been able to go to church lately. He does contract for safety saying he has no thoughts of hurting himself or anyone else.  Plan: Return again in 1 weeks.  Diagnosis: Axis I: 300.02    Axis II: Deferred    Adil Tugwell M, [facility] 09/09/2013

## 2013-09-16 ENCOUNTER — Ambulatory Visit (HOSPITAL_COMMUNITY): Payer: Self-pay | Admitting: Behavioral Health

## 2013-09-23 ENCOUNTER — Encounter (HOSPITAL_COMMUNITY): Payer: Self-pay | Admitting: Behavioral Health

## 2013-09-23 ENCOUNTER — Ambulatory Visit (INDEPENDENT_AMBULATORY_CARE_PROVIDER_SITE_OTHER): Payer: 59 | Admitting: Behavioral Health

## 2013-09-23 DIAGNOSIS — F4310 Post-traumatic stress disorder, unspecified: Secondary | ICD-10-CM

## 2013-09-23 NOTE — Progress Notes (Signed)
   THERAPIST PROGRESS NOTE  Session Time: 10:00  Participation Level: Active  Behavioral Response: CasualAlertAnxious  Type of Therapy: Individual Therapy  Treatment Goals addressed: Coping  Interventions: CBT  Summary: Daneil Beem is a 52 y.o. male who presents with anxiety.   Suicidal/Homicidal: Nowithout intent/plan  Therapist Response: The client did report with moderate anxiety today. He did report that he has been approved for long-term disability. He said that his insurance company is suggesting that he apply for Social Security disability and he says that he will start that process as soon as possible. He did say that looking at the insurance and disability paperwork creates anxiety for him so I suggested that he put in a place where he did not want by everyday and to allow his wife to help him with paperwork as much is possible. She did report significant progress saying that he drove to a retailer which created significant anxiety for him when inside and purchased something. He stated that his anxiety about being on the phone has gotten worse after having to speak to his employer and insurance company often. He states that he has difficulty even dialing the phone to someone that he knows well. We also talked about using gradual exposure therapy with the phone as a way to begin to relieve anxiety. He does say that in the long term he wants to be able to be able to do something productive and knows that minimizing anxiety will help Nedra Hai to that goal. He does contract for safety having no thoughts of hurting himself or anyone else.  Plan: Return again in 1 weeks.  Diagnosis: Axis I: PTSD    Axis II: Deferred    [name], [facility] 09/23/2013

## 2013-09-26 ENCOUNTER — Other Ambulatory Visit: Payer: Self-pay | Admitting: Sports Medicine

## 2013-10-06 ENCOUNTER — Encounter (HOSPITAL_COMMUNITY): Payer: Self-pay | Admitting: Behavioral Health

## 2013-10-06 ENCOUNTER — Encounter (INDEPENDENT_AMBULATORY_CARE_PROVIDER_SITE_OTHER): Payer: Self-pay

## 2013-10-06 ENCOUNTER — Ambulatory Visit (INDEPENDENT_AMBULATORY_CARE_PROVIDER_SITE_OTHER): Payer: 59 | Admitting: Behavioral Health

## 2013-10-06 DIAGNOSIS — F411 Generalized anxiety disorder: Secondary | ICD-10-CM

## 2013-10-06 NOTE — Progress Notes (Signed)
   THERAPIST PROGRESS NOTE  Session Time: 1:00  Participation Level: Active  Behavioral Response: CasualAlertAnxious  Type of Therapy: Individual Therapy  Treatment Goals addressed: Coping  Interventions: CBT  Summary: Stephen Myers is a 52 y.o. male who presents with anxiety.   Suicidal/Homicidal: Nowithout intent/plan  Therapist Response: The client indicated that he had conversations with a group called [employer] who is working with [employer] to process Social Security disability. He indicated that they may be contacting this therapist and Dr. Hilton Cork for additional information.  He did indicate that the calls of the contact with [employer] and [employer] that he had to return to phone calls. He indicated that he did as we discussed in the last session and did gradually. Indicated that her shirt and he was anxiety related to calling but was proud of himself for the effort that he made him the ability to complete phone calls. He didn't admit some nervousness and phone calls but added that the representative was easy to speak with which made  his efforts easier.  We spent most of the session discussing how his daughter's choices and behaviors are affecting his anxiety. The client indicates that he is gotten to the point where he knows that he has done all that he can do to help his daughter and she has to make better choices and decisions. We talked at length about he and his wife setting very firm boundaries with his daughter and presenting united front in how they treat her and presenting with her expectations are of her as long as she is living in their home.  The client does contract for safety saying he has no thoughts of hurting himself or anyone else.  Plan: Return again in 1 weeks.  Diagnosis: Axis I: PTSD    Axis II: Deferred    [name], [facility] 10/06/2013

## 2013-10-07 ENCOUNTER — Ambulatory Visit (INDEPENDENT_AMBULATORY_CARE_PROVIDER_SITE_OTHER): Payer: 59 | Admitting: Psychiatry

## 2013-10-07 ENCOUNTER — Encounter (HOSPITAL_COMMUNITY): Payer: Self-pay | Admitting: Psychiatry

## 2013-10-07 ENCOUNTER — Encounter (INDEPENDENT_AMBULATORY_CARE_PROVIDER_SITE_OTHER): Payer: Self-pay

## 2013-10-07 VITALS — BP 122/73 | HR 90 | Ht 76.0 in | Wt 294.0 lb

## 2013-10-07 DIAGNOSIS — F4310 Post-traumatic stress disorder, unspecified: Secondary | ICD-10-CM

## 2013-10-07 DIAGNOSIS — F429 Obsessive-compulsive disorder, unspecified: Secondary | ICD-10-CM

## 2013-10-07 DIAGNOSIS — F4001 Agoraphobia with panic disorder: Secondary | ICD-10-CM

## 2013-10-07 MED ORDER — LITHIUM CARBONATE 600 MG PO CAPS: 600.0000 mg | ORAL_CAPSULE | Freq: Every day | ORAL | Status: DC

## 2013-10-07 MED ORDER — ARIPIPRAZOLE 10 MG PO TABS: 10.0000 mg | ORAL_TABLET | Freq: Every day | ORAL | Status: DC

## 2013-10-07 MED ORDER — NALTREXONE HCL 50 MG PO TABS: 50.0000 mg | ORAL_TABLET | Freq: Every day | ORAL | Status: DC

## 2013-10-07 MED ORDER — CLONAZEPAM 1 MG PO TABS: 1.0000 mg | ORAL_TABLET | Freq: Two times a day (BID) | ORAL | Status: DC

## 2013-10-07 NOTE — Progress Notes (Signed)
[facility] Behavioral Health Follow-up Outpatient Visit  Stephen Myers July 05, 1961  Date: 10/07/2013  HPI Comments: Stephen Myers is a 52 Y/O male with a past psychiatric history significant for Major Depression, recurrent, moderate. Obsessive Compulsive Disorder, Anxiety Disorder, NOS. The patient is referred for psychiatric services for medication management.   . Location-Patient continues to worry and stress about his daughter's addiction.  . Quality: The patient reports that her daughter has started cutting herself 2 days ago. He and his wife have been struggling to help their daughter.  His mother-in-law passed away 2 weeks ago, and their daughter has been severely depressed and voiced suicidal thoughts in the past month. He reports he becomes more tense and anxious now with every interaction.   The patient denies current suicidal ideation, intent, or plans. He  denies any current homicidal ideation, plans or intent. Patient denies auditory hallucinations. Patient denies visual hallucinations. The patient endorses continued paranoia about what people think about him.. Patient states sleep is increased, with 9 hours per night, broken,  waking up 2 times per night. Energy level is low and he has stopped excercising.  The patient had panic attacks in the past week with his last panic attack two days ago.  . Severity: Depression: 5/10 (0=Very depressed; 5=Neutral; 10=Very Happy)  Anxiety- 6-7/10 (0=no anxiety; 5= moderate/tolerable anxiety; 10= panic attacks)  . Duration: More than 1 year, anxiety is waxing and waning but has worsened overall.   . Timing: No specific timing, symptoms present throughout the day.  . Context: Social interactions, daughter's declining mental health.  . Modifying factors: spending time with his wife  . Associated signs and symptoms: Denies any recent episodes consistent with mania, particularly decreased need for sleep with increased energy, grandiosity,  impulsivity, hyperverbal and pressured speech, or increased productivity. Denies any recent symptoms consistent with psychosis, particularly auditory or visual hallucinations, thought broadcasting/insertion/withdrawal, or ideas of reference.   Review of Systems  Constitutional: Negative for fever, chills and weight loss.  Respiratory: Positive for shortness of breath (With panic attacks.). Negative for cough, sputum production and wheezing.   Cardiovascular: Negative for chest pain, palpitations and leg swelling.  Gastrointestinal: Negative for nausea, vomiting, abdominal pain, diarrhea and constipation.  Musculoskeletal:       No gait abnormalities or muscle weakness.  Neurological: Positive for sensory change (Burning sensation in leg. ).   Filed Vitals:   10/07/13 1507  BP: 122/73  Pulse: 90  Height: 6\' 4"  (1.93 m)  Weight: 294 lb (133.358 kg)    Physical Exam  Constitutional: He appears well-developed and well-nourished. No acute physical distress.  Skin: He is not diaphoretic. Musculoskeletal: Strength & Muscle Tone: within normal limits Gait & Station: normal Patient leans: N/A'    HISTORY OF SUICIDAL ACTS AND SELF-HARM:  None.   HISTORY OF VIOLENCE/ASSAULTING OTHERS/LEGAL PROBLEMS:  No current legal issues.   SUBSTANCE USE HISTORY:  Caffeine: Coffee 2 cups per day. Caffeinated Beverages (green tea) 16 ounces per day.  Nicotine: Patient denies.  Alcohol:  Patient  denies Drugs: Patient denies.   MENTAL ILLNESS AND SUBSTANCE ABUSE IN FAMILY MEMBERS: Reviewed  Psychiatric illness: Father-Bipolar Disorder. Mother-depression.  Substance abuse: Paternal-Alcoholism  Suicides: Patient denied   MILITARY HISTORY:  [employer]   Allergies: No Known Allergies   MEDICAL INFORMATION  Past Medical History  Diagnosis Date  . Irritability and anger     symptoms at home and work  . Anxiety   . Depression    b) CURRENT  PRESCRIBED MEDICATIONS:  Current Outpatient  Prescriptions on File Prior to Visit  Medication Sig Dispense Refill  . ARIPiprazole (ABILIFY) 10 MG tablet Take 1 tablet (10 mg total) by mouth at bedtime. For adjuvant therapy for depression.  30 tablet  0  . aspirin 81 MG tablet Take 1 tablet (81 mg total) by mouth daily.  30 tablet    . atorvastatin (LIPITOR) 40 MG tablet Take 1 tablet (40 mg total) by mouth daily. For cholesterol.      . clonazePAM (KLONOPIN) 1 MG tablet Take 1 tablet (1 mg total) by mouth 2 (two) times daily. For anxiety.  60 tablet  2  . lithium 600 MG capsule Take 1 capsule (600 mg total) by mouth at bedtime. For mood stabilization.  30 capsule  1  . losartan-hydrochlorothiazide (HYZAAR) 50-12.5 MG per tablet TAKE 1 TABLET BY MOUTH DAILY.  90 tablet  1  . naltrexone (DEPADE) 50 MG tablet Take 1 tablet (50 mg total) by mouth daily. For adjunctive therapy for depression.  30 tablet  1  . traZODone (DESYREL) 50 MG tablet Take 1 tablet (50 mg total) by mouth at bedtime. For insomnia.  30 tablet  0  . vitamin B-12 (CYANOCOBALAMIN) 500 MCG tablet Take 2 tablets (1,000 mcg total) by mouth daily. For low iron deficiency.      . [DISCONTINUED] clomiPRAMINE (ANAFRANIL) 25 MG capsule Take 1 capsule (25 mg total) by mouth at bedtime.  30 capsule  0   No current facility-administered medications on file prior to visit.    e) CURRENT OTC MEDICATIONS  1)  Vit B   ALLERGIES/ ADVERSE REACTIONS:  1) None   Psychiatric Specialty Examination:  Objective: Appearance: Fairly Groomed, Patient is tearful through the interview.  Eye Contact: fair   Speech: Nonrmal  Volume: Normal   Mood: "kind of depressed"   Depression: 5/10 (0=Very depressed; 5=Neutral; 10=Very Happy)  Anxiety- 6-7/10 (0=no anxiety; 5= moderate/tolerable anxiety; 10= panic attacks)  Affect: Full range, congruent   Thought Process: Coherent and Goal Directed   Orientation: Oriented to person, place, year, and month, got date wrong.  Thought Content: Within normal  limits with the exception of paranoia regarding people taking advantage of him.   Suicidal Thoughts: Patient denies suicidal ideation, intent or plans today.   Homicidal Thoughts: No   Memory: Immediate; Good  Recent:   Judgement: Good   Insight: Fair   Psychomotor Activity: Normal   Concentration:Fair   Memory: intact immediate 3/3; impaired recent 2/3   Akathisia: No   Handed: Right   AIMS (if indicated): not indicates   Assets:  Desire for Improvement  Financial Resources/Insurance  Housing  Social Support    Lab Results: No results found for this or any previous visit (from the past 48 hour(S)).   SUMMARY AND FORMULATION:   ASSESSMENT:  Axis I: Post Traumatic Stress disorder, Panic Disorder with Agoraphobia, Obsessive Compulsive Disorder, Rule out Bipolar II Disorder  Axis II: No diagnosis.  Axis III: No diagnosis  Axis IV: Job related stressors, familial stressors.  Axis V: GAF: 40  PLAN:   Plan of Care:  PLAN:  1. Affirm with the patient that the medications are taken as ordered. Patient  expressed understanding of how their medications were to be used.    Laboratory:  No labs warranted at this time. Will reorder lithium levels, TSH, BUN, Creatinine at future visits.   Psychotherapy: Therapy: brief supportive therapy provided. Discussed psychosocial stressors. Continue individual outpatient therapy.  Will refer to IOP if necessary based on symptoms.   Medications:  Continue the following psychiatric medications as written prior to this appointment:  A) Continue naltrexone 50 mg daily-No change in dose. B) Will continue clonazepam 1 mg BID-No change in dose. Will attempt to wean when the patient's anxiety has improved. C) Will continue Abilify to 10 mg daily. Will consider increase in this medication at next visit. D) Continue Lithium 600 mg-No change in dose. E) Trazodone 50 mg PRN--No change in dose. E) Given his current severity of symptoms we are in the  process of application for long term disability. -Risks and benefits, side effects and alternatives discussed with patient, he was given an opportunity to ask questions about his medication, illness, and treatment. All current psychiatric medications have been reviewed and discussed with the patient and adjusted as clinically appropriate. The patient has been provided an accurate and updated list of the medications being now prescribed.   Routine PRN Medications:  Negative  Consultations: The patient was encouraged to keep all PCP and specialty clinic appointments.   Safety Concerns:   Patient told to call clinic if any problems occur. Patient advised to go to  ER  if he should develop SI/HI, side effects, or if symptoms worsen. Has crisis numbers to call if needed.    Other:   8. Patient was instructed to return to clinic in 4-6 weeks.  9. The patient was advised to call and cancel their mental health appointment within 24 hours of the appointment, if they are unable to keep the appointment, as well as the three no show and termination from clinic policy. 10. The patient expressed understanding of the plan and agrees with the above.   Jacqulyn Cane, M.D.  10/07/2013 3:00 PM

## 2013-10-07 NOTE — Progress Notes (Deleted)
[facility] MD Progress Note  10/07/2013 2:58 PM Stephen Myers  MRN:  [id number] Subjective:  *** Diagnosis:   DSM5: Schizophrenia Disorders:  {BHH Schizophrenia (DSM5):304703} Obsessive-Compulsive Disorders:  {BHH Obsessive Compulsive:304704} Trauma-Stressor Disorders:  {BHH Trauma/Stressor:304705} Substance/Addictive Disorders:  {BHH Substance/Addictive Disorders:304706} Depressive Disorders:  {BHH Depressive Disorders:304707}  {Axis Diagnosis:3049000}  ADL's:  {BHH ADL'S:22290}  Sleep: {BHH WUJW/JXBJ/YNWG:95621}  Appetite:  {BHH GOOD/FAIR/POOR:22877}  Suicidal Ideation:  {BHH IDEATION:22878} Homicidal Ideation:  {BHH IDEATION:22878} AEB (as evidenced by):  Psychiatric Specialty Exam: ROS  There were no vitals taken for this visit.There is no weight on file to calculate BMI.  General Appearance: {Appearance:22683}  Eye Contact::  {BHH EYE CONTACT:22684}  Speech:  {Speech:22685}  Volume:  {Volume (PAA):22686}  Mood:  {BHH MOOD:22306}  Affect:  {Affect (PAA):22687}  Thought Process:  {Thought Process (PAA):22688}  Orientation:  {BHH ORIENTATION (PAA):22689}  Thought Content:  {Thought Content:22690}  Suicidal Thoughts:  {ST/HT (PAA):22692}  Homicidal Thoughts:  {ST/HT (PAA):22692}  Memory:  {BHH MEMORY:22881}  Judgement:  {Judgement (PAA):22694}  Insight:  {Insight (PAA):22695}  Psychomotor Activity:  {Psychomotor (PAA):22696}  Concentration:  {BHH GOOD/FAIR/POOR:22877}  Recall:  {BHH GOOD/FAIR/POOR:22877}  Akathisia:  {BHH YES OR NO:22294}  Handed:  {Handed:22697}  AIMS (if indicated):     Assets:  {Assets (PAA):22698}  Sleep:      Current Medications: Current Outpatient Prescriptions  Medication Sig Dispense Refill  . ARIPiprazole (ABILIFY) 10 MG tablet Take 1 tablet (10 mg total) by mouth at bedtime. For adjuvant therapy for depression.  30 tablet  0  . aspirin 81 MG tablet Take 1 tablet (81 mg total) by mouth daily.  30 tablet    . atorvastatin (LIPITOR) 40 MG  tablet Take 1 tablet (40 mg total) by mouth daily. For cholesterol.      . clonazePAM (KLONOPIN) 1 MG tablet Take 1 tablet (1 mg total) by mouth 2 (two) times daily. For anxiety.  60 tablet  2  . lithium 600 MG capsule Take 1 capsule (600 mg total) by mouth at bedtime. For mood stabilization.  30 capsule  1  . losartan-hydrochlorothiazide (HYZAAR) 50-12.5 MG per tablet TAKE 1 TABLET BY MOUTH DAILY.  90 tablet  1  . naltrexone (DEPADE) 50 MG tablet Take 1 tablet (50 mg total) by mouth daily. For adjunctive therapy for depression.  30 tablet  1  . traZODone (DESYREL) 50 MG tablet Take 1 tablet (50 mg total) by mouth at bedtime. For insomnia.  30 tablet  0  . vitamin B-12 (CYANOCOBALAMIN) 500 MCG tablet Take 2 tablets (1,000 mcg total) by mouth daily. For low iron deficiency.      . [DISCONTINUED] clomiPRAMINE (ANAFRANIL) 25 MG capsule Take 1 capsule (25 mg total) by mouth at bedtime.  30 capsule  0   No current facility-administered medications for this visit.    Lab Results: No results found for this or any previous visit (from the past 48 hour(s)).  Physical Findings: AIMS:  , ,  ,  ,    CIWA:    COWS:     Treatment Plan Summary: {BHH MD TX. HYQM:57846}  Plan:  Medical Decision Making Problem Points:  {BHH Problem Points:20777} Data Points:  {BHH Data Points:20775}  I certify that inpatient services furnished can reasonably be expected to improve the patient's condition.   Stephen Myers 10/07/2013, 2:58 PM

## 2013-10-14 ENCOUNTER — Encounter (INDEPENDENT_AMBULATORY_CARE_PROVIDER_SITE_OTHER): Payer: Self-pay

## 2013-10-14 ENCOUNTER — Encounter (HOSPITAL_COMMUNITY): Payer: Self-pay | Admitting: Behavioral Health

## 2013-10-14 ENCOUNTER — Ambulatory Visit (INDEPENDENT_AMBULATORY_CARE_PROVIDER_SITE_OTHER): Payer: 59 | Admitting: Behavioral Health

## 2013-10-14 DIAGNOSIS — F4310 Post-traumatic stress disorder, unspecified: Secondary | ICD-10-CM
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
## 2013-10-21 ENCOUNTER — Encounter (HOSPITAL_COMMUNITY): Payer: Self-pay | Admitting: Behavioral Health

## 2013-10-21 ENCOUNTER — Ambulatory Visit (INDEPENDENT_AMBULATORY_CARE_PROVIDER_SITE_OTHER): Payer: 59 | Admitting: Behavioral Health

## 2013-10-21 ENCOUNTER — Encounter (INDEPENDENT_AMBULATORY_CARE_PROVIDER_SITE_OTHER): Payer: Self-pay

## 2013-10-21 DIAGNOSIS — F4310 Post-traumatic stress disorder, unspecified: Secondary | ICD-10-CM

## 2013-10-21 NOTE — Progress Notes (Signed)
   THERAPIST PROGRESS NOTE  Session Time: 1:00  Participation Level: Active  Behavioral Response: CasualAlertAnxious  Type of Therapy: Individual Therapy  Treatment Goals addressed: Coping  Interventions: CBT  Summary: Stephen Myers is a 52 y.o. male who presents with anxiety.   Suicidal/Homicidal: Nowithout intent/plan  Therapist Response: The client does present with mild-to-moderate anxiety. He said that anticipating a phone interview regarding Social Security disability is creating anxiety for him. He reports that filling out the forms that they requested create anxiety but he did complete them. Some of his anxiety comes from the fact that he is anticipating additional questions or not on the forms. I reminded him that these people are on his team and are working hard to get Social Security disability for him. We did talk about writing down a list of potential questions that it may ask so that he did jot down answers and be better prepared for the phone call on the 29th.  He did report that he completed his homework going to the [employer] and buying gas. He was proud of the fact that he is taking that step. As he has a progress of that we switched gears somewhat for homework starting the week after Christmas I asked him to call me at work. I reminded him that it would be a brief called the point was to begin to gradually asked those him to what is creating anxiety for him an effort to reduce it much as we had done was going to different places. He agreed to do so.  He does contract for safety saying he has no thoughts of hurting himself or anyone else.    Plan: Return again in 1 weeks.  Diagnosis: Axis I: 309.81    Axis II: Deferred    [name], [facility] 10/21/2013

## 2013-11-02 ENCOUNTER — Ambulatory Visit (INDEPENDENT_AMBULATORY_CARE_PROVIDER_SITE_OTHER): Payer: 59 | Admitting: Behavioral Health

## 2013-11-02 ENCOUNTER — Other Ambulatory Visit: Payer: Self-pay | Admitting: Sports Medicine

## 2013-11-02 ENCOUNTER — Encounter (HOSPITAL_COMMUNITY): Payer: Self-pay | Admitting: Behavioral Health

## 2013-11-02 DIAGNOSIS — F4310 Post-traumatic stress disorder, unspecified: Secondary | ICD-10-CM

## 2013-11-02 NOTE — Progress Notes (Signed)
   THERAPIST PROGRESS NOTE  Session Time: 9:00  Participation Level: Active  Behavioral Response: CasualAlertAnxious  Type of Therapy: Individual Therapy  Treatment Goals addressed: Coping  Interventions: CBT  Summary: Stephen Myers is a 52 y.o. male who presents with anxiety.   Suicidal/Homicidal: Nowithout intent/plan  Therapist Response: The client reported that he did speak with the agency who is helping him secure Social Security disability. He did report that e created significant anxiety for him knowing when they were going to call. He did say that he prepared a list of questions in addition to the information that they had already requested so he would feel more prepared. He did report to work couple of questions, especially 1 related to diagnoses decorate some anxiety but stated that having his information front of him and his wife beside him reduced that anxiety. He also indicated that he called and left a message for me on Monday, December 29 as I had requested.  The client reports some increased anxiety when he passes stores that he may have called or stores  that may be similar to the ones that he called on. He also reported some increased anxiety saying that he has begun to have thoughts about work even while sitting at home doing something that brings pleasure. We talked about removing all signs of work around his house including towards, magazines, finals etc. interesting things that he did not need. For homework he indicated that he wants to try to go in to a [employer]. He indicates that the size of the store and  number of people were always somewhat anxiety producing for him and he knows he needs to deal with anxiety. I did not ask that he call me before the next session, only to go into [employer] and going and if he can.  The client does contract for safety saying he has no thoughts of hurting himself or anyone else.  Plan: Return again in 1  weeks.  Diagnosis: Axis I: 309.81    Axis II: Deferred    [name], [facility] 11/02/2013

## 2013-11-04 ENCOUNTER — Other Ambulatory Visit: Payer: Self-pay | Admitting: Sports Medicine

## 2013-11-05 ENCOUNTER — Other Ambulatory Visit (HOSPITAL_COMMUNITY): Payer: Self-pay | Admitting: Psychiatry

## 2013-11-11 ENCOUNTER — Encounter (INDEPENDENT_AMBULATORY_CARE_PROVIDER_SITE_OTHER): Payer: Self-pay

## 2013-11-11 ENCOUNTER — Encounter (HOSPITAL_COMMUNITY): Payer: Self-pay | Admitting: Behavioral Health

## 2013-11-11 ENCOUNTER — Ambulatory Visit (INDEPENDENT_AMBULATORY_CARE_PROVIDER_SITE_OTHER): Payer: 59 | Admitting: Behavioral Health

## 2013-11-11 DIAGNOSIS — F411 Generalized anxiety disorder: Secondary | ICD-10-CM

## 2013-11-11 DIAGNOSIS — F331 Major depressive disorder, recurrent, moderate: Secondary | ICD-10-CM

## 2013-11-11 NOTE — Progress Notes (Signed)
   THERAPIST PROGRESS NOTE  Session Time: 4:00  Participation Level: Active  Behavioral Response: CasualAlerttearful  Type of Therapy: Individual Therapy  Treatment Goals addressed: Coping  Interventions: CBT  Summary: Shaquan Missey is a 53 y.o. male who presents with anxiety and depression.   Suicidal/Homicidal: Nowithout intent/plan  Therapist Response: The client and I processed how see her connected his feelings toward his now deceased father and fear for his daughter are connected in terms of his PTSD diagnosis. He recognizes that a fear of to be fearful were his daughter are strongly connected to situations that he would typically not be anxious about. The stated that he is anxious about filling out paperwork, about making phone calls. I reminded him that it was not a fear of doing knows the fear of what they represented. He is being harmed himself saying that he is always been a man who was able to control situations and not be fearful but recognizes his anxiety and depression are making him feel at times like he cannot control situations. He is understanding that he has done as much as he can do to help his daughter but is still fearful that she may do something which could cost her life especially if it is drug related. He does report some sense of relief saying that his daughter is currently seeing a psychiatrist and therapist that he feels are making a difference. We talked about the concept of forgiveness and a question if he had forgiven his father and if he had forgiven his daughter. He became very tearful at that point saying that he had forgiven some things his father had done but not sure if he had completely forgiven his father. I asked him to write down things that he would like to his father so we can begin to get the thoughts in the anxiety out of his head. He agreed to do so. He does contract for safety saying he has no thoughts of hurting himself or anyone else.  Plan:  Return again in 1 weeks.  Diagnosis: Axis I: 300.02/296.32    Axis II: Deferred    Emina Ribaudo M, LPC 11/11/2013

## 2013-11-16 ENCOUNTER — Encounter (HOSPITAL_COMMUNITY): Payer: Self-pay | Admitting: Psychiatry

## 2013-11-16 ENCOUNTER — Ambulatory Visit (INDEPENDENT_AMBULATORY_CARE_PROVIDER_SITE_OTHER): Payer: 59 | Admitting: Psychiatry

## 2013-11-16 VITALS — BP 133/73 | HR 85 | Wt 299.0 lb

## 2013-11-16 DIAGNOSIS — F429 Obsessive-compulsive disorder, unspecified: Secondary | ICD-10-CM

## 2013-11-16 DIAGNOSIS — F4310 Post-traumatic stress disorder, unspecified: Secondary | ICD-10-CM

## 2013-11-16 DIAGNOSIS — F4001 Agoraphobia with panic disorder: Secondary | ICD-10-CM

## 2013-11-16 MED ORDER — ARIPIPRAZOLE 10 MG PO TABS: 10.0000 mg | ORAL_TABLET | Freq: Every day | ORAL | Status: DC

## 2013-11-16 NOTE — Progress Notes (Signed)
[facility] Follow-up Outpatient Visit  Stephen Myers Dec 27, 1960  Date: 11/16/2013  HPI Comments: Mr. Whitelow is a 53 Y/O male with a past psychiatric history significant for Major Depression, recurrent, moderate. Obsessive Compulsive Disorder, Anxiety Disorder, NOS. The patient is referred for psychiatric services for medication management.   . Location-Patient continues to worry and stress about his daughter's addiction.  . Quality: The patient reports that he has been having some difficulties at home with his daughter.  He reports his daughter being hospitalized has been a relief to him. The patient reports that his daughter continues to have severe psychiatric problems and has been put in inpatient psychiatric treatment.     The patient denies current suicidal ideation, intent, or plans. He  denies any current homicidal ideation, plans or intent. Patient denies auditory hallucinations. Patient denies visual hallucinations. The patient reports a reduction in paranoia. Patient states sleep has improved, with 8-10 Energy level is low and he has stopped excercising.  The patient had panic attacks recently approximately 5 days ago when he put his daughter in the clonazepam.  . Severity: Depression: 5/10 (0=Very depressed; 5=Neutral; 10=Very Happy)  Anxiety- 6/10 (0=no anxiety; 5= moderate/tolerable anxiety; 10= panic attacks)  . Duration: More than 1 year, anxiety is waxing and waning but has worsened overall.   . Timing: Mornings are worse,but symptoms present throughout the day.  . Context: Social interactions, daughter's declining mental health.  . Modifying factors: Improved with spending time with his wife.  . Associated signs and symptoms: Denies any recent episodes consistent with mania, particularly decreased need for sleep with increased energy, grandiosity, impulsivity, hyperverbal and pressured speech, or increased productivity. Denies any recent symptoms  consistent with psychosis, particularly auditory or visual hallucinations, thought broadcasting/insertion/withdrawal, or ideas of reference.   Review of Systems  Constitutional: Negative for fever, chills and weight loss.  Respiratory: Positive for shortness of breath (With panic attacks.). Negative for cough, sputum production and wheezing.   Cardiovascular: Negative for chest pain, palpitations and leg swelling.  Gastrointestinal: Negative for nausea, vomiting, abdominal pain, diarrhea and constipation.  Musculoskeletal:       No gait abnormalities or muscle weakness.  Neurological: Positive for sensory change (Burning sensation in leg. ).   Filed Vitals:   11/16/13 1348  BP: 133/73  Pulse: 85  Weight: 299 lb (135.626 kg)    Physical Exam  Constitutional: He appears well-developed and well-nourished. No acute physical distress.  Skin: He is not diaphoretic. Musculoskeletal: Strength & Muscle Tone: within normal limits Gait & Station: normal Patient leans: N/A'    HISTORY OF SUICIDAL ACTS AND SELF-HARM:  None.   HISTORY OF VIOLENCE/ASSAULTING OTHERS/LEGAL PROBLEMS:  No current legal issues.   SUBSTANCE USE HISTORY:  Caffeine: Coffee 2 cups per day. Caffeinated Beverages (green tea) 16 ounces per day.  Nicotine: Patient denies.  Alcohol:  Patient  denies Drugs: Patient denies.   MENTAL ILLNESS AND SUBSTANCE ABUSE IN FAMILY MEMBERS: Reviewed  Psychiatric illness: Father-Bipolar Disorder. Mother-depression.  Substance abuse: Paternal-Alcoholism  Suicides: Patient denied   MILITARY HISTORY:  [employer]   Allergies: No Known Allergies   MEDICAL INFORMATION  Past Medical History  Diagnosis Date  . Irritability and anger     symptoms at home and work  . Anxiety   . Depression    b) CURRENT PRESCRIBED MEDICATIONS:  Current Outpatient Prescriptions on File Prior to Visit  Medication Sig Dispense Refill  . ARIPiprazole (ABILIFY) 10 MG tablet Take 1 tablet (10  mg  total) by mouth at bedtime. For adjuvant therapy for depression.  30 tablet  1  . aspirin 81 MG tablet Take 1 tablet (81 mg total) by mouth daily.  30 tablet    . atorvastatin (LIPITOR) 40 MG tablet Take 1 tablet (40 mg total) by mouth daily. For cholesterol.      . clonazePAM (KLONOPIN) 1 MG tablet Take 1 tablet (1 mg total) by mouth 2 (two) times daily. For anxiety.  60 tablet  1  . lithium 600 MG capsule TAKE ONE CAPSULE BY MOUTH AT BEDTIME  30 capsule  1  . losartan-hydrochlorothiazide (HYZAAR) 50-12.5 MG per tablet TAKE 1 TABLET BY MOUTH DAILY.  90 tablet  1  . naltrexone (DEPADE) 50 MG tablet Take 1 tablet (50 mg total) by mouth daily. For adjunctive therapy for depression.  30 tablet  1  . vitamin B-12 (CYANOCOBALAMIN) 500 MCG tablet Take 2 tablets (1,000 mcg total) by mouth daily. For low iron deficiency.      . [DISCONTINUED] clomiPRAMINE (ANAFRANIL) 25 MG capsule Take 1 capsule (25 mg total) by mouth at bedtime.  30 capsule  0   No current facility-administered medications on file prior to visit.    e) CURRENT OTC MEDICATIONS  1)  Vit B   ALLERGIES/ ADVERSE REACTIONS:  1) None   Psychiatric Specialty Examination:  Objective: Appearance: Fairly Groomed, Patient is tearful through the interview.  Eye Contact: fair   Speech: Nonrmal  Volume: Normal   Mood: "anxious"     Affect: Full range, congruent   Thought Process: Coherent and Goal Directed   Orientation: Oriented to person, place, year, and month, got date wrong.  Thought Content: Within normal limits with the exception of paranoia regarding people taking advantage of him.   Suicidal Thoughts: Patient denies suicidal ideation, intent or plans today.   Homicidal Thoughts: No   Memory: Immediate; Good  Recent:   Judgement: Good   Insight: Fair   Psychomotor Activity: Normal   Concentration:Fair   Memory: intact immediate 3/3; impaired recent 2/3   Akathisia: No   Handed: Right   AIMS (if indicated): not indicates    Assets:  Desire for Improvement  Financial Resources/Insurance  Housing  Social Support    Lab Results: No results found for this or any previous visit (from the past 48 hour(S)).   SUMMARY AND FORMULATION:   ASSESSMENT:  Posttruamatic stress disorder, stable Panic Disorder with Agoraphobia-mild improvement.  Axis I: Post Traumatic Stress disorder, Panic Disorder with Agoraphobia, Obsessive Compulsive Disorder, Rule out Bipolar II Disorder  Axis II: No diagnosis.  Axis III: No diagnosis  Axis IV: Job related stressors, familial stressors.  Axis V: GAF: 40  PLAN:   Plan of Care:  PLAN:  1. Affirm with the patient that the medications are taken as ordered. Patient  expressed understanding of how their medications were to be used.    Laboratory:  No labs warranted at this time. Will reorder lithium levels, TSH, BUN, Creatinine at future visits.   Psychotherapy: Therapy: brief supportive therapy provided. Discussed psychosocial stressors. Continue individual outpatient therapy.   Will refer to IOP if necessary based on symptoms.   Medications:  Continue the following psychiatric medications as written prior to this appointment:  A) Continue naltrexone 50 mg daily-No change in dose. B) Will decrease clonazepam 1 mg QAM and 1/2 mg QHS.  Will attempt to wean further. C) Will continue Abilify to 10 mg daily. Will consider increase in this medication  at next visit. D) Continue Lithium 600 mg-No change in dose. E) Trazodone 50 mg PRN--No change in dose. F) Given his current severity of symptoms we are in the process of application for long term disability. -Risks and benefits, side effects and alternatives discussed with patient, he was given an opportunity to ask questions about his medication, illness, and treatment. All current psychiatric medications have been reviewed and discussed with the patient and adjusted as clinically appropriate. The patient has been provided an accurate  and updated list of the medications being now prescribed.   Routine PRN Medications:  Negative  Consultations: The patient was encouraged to keep all PCP and specialty clinic appointments.   Safety Concerns:   Patient told to call clinic if any problems occur. Patient advised to go to  ER  if he should develop SI/HI, side effects, or if symptoms worsen. Has crisis numbers to call if needed.    Other:   8. Patient was instructed to return to clinic in 4-6 weeks.  9. The patient was advised to call and cancel their mental health appointment within 24 hours of the appointment, if they are unable to keep the appointment, as well as the three no show and termination from clinic policy. 10. The patient expressed understanding of the plan and agrees with the above.  Time Spent: 30 minutes Coralyn Helling, M.D.  11/16/2013 1:45 PM

## 2013-11-17 ENCOUNTER — Encounter (HOSPITAL_COMMUNITY): Payer: Self-pay | Admitting: Behavioral Health

## 2013-11-18 ENCOUNTER — Ambulatory Visit (HOSPITAL_COMMUNITY): Payer: Self-pay | Admitting: Behavioral Health

## 2013-11-18 ENCOUNTER — Encounter (HOSPITAL_COMMUNITY): Payer: Self-pay | Admitting: Behavioral Health

## 2013-11-18 ENCOUNTER — Ambulatory Visit (INDEPENDENT_AMBULATORY_CARE_PROVIDER_SITE_OTHER): Payer: 59 | Admitting: Behavioral Health

## 2013-11-18 DIAGNOSIS — F4310 Post-traumatic stress disorder, unspecified: Secondary | ICD-10-CM

## 2013-11-18 LAB — LIPID PANEL
CHOL/HDL RATIO: 3 ratio
CHOLESTEROL: 109 mg/dL (ref 0–200)
HDL: 36 mg/dL — ABNORMAL LOW (ref >39)
LDL CALC: 49 mg/dL (ref 0–99)
TRIGLYCERIDES: 119 mg/dL (ref <150)
VLDL: 24 mg/dL (ref 0–40)

## 2013-11-18 LAB — HEMOGLOBIN A1C
Hgb A1c MFr Bld: 5.2 % (ref <5.7)
Mean Plasma Glucose: 103 mg/dL (ref <117)

## 2013-11-18 LAB — CREATININE, SERUM: CREATININE: 0.88 mg/dL (ref 0.50–1.35)

## 2013-11-18 NOTE — Progress Notes (Signed)
   THERAPIST PROGRESS NOTE  Session Time: 10:00  Participation Level: Active  Behavioral Response: CasualAlertAnxious  Type of Therapy: Individual Therapy  Treatment Goals addressed: Coping  Interventions: CBT  Summary: Artin Mceuen is a 53 y.o. male who presents with anxiety.   Suicidal/Homicidal: Nowithout intent/plan  Therapist Response: The client presented with mixed feelings in regard to his daughter being hospitalized. He indicated that she had been saying that she wanted to be with her now deceased boyfriend and asked to be hospitalized. He was resistant initially but indicated that he knew that was probably best. It appears that the client is coming more to terms with the fact that he has done all that he could in terms of helping his daughter and is more willing to begin to break the pattern of enmeshment with his daughter. He indicates that he recognizes that she has been manipulative and that he and his wife have at times been enabling to her and he cannot continue to do that. He remains concerned about her safety but feels that her time in the hospital has been good and she per his words is beginning to look and sound like her old self. We spent significant time talking about setting verbal, mental, and emotional boundaries with his daughter and how her health and a ripple effect on his health. He does contract for safety saying he has no suicidal thoughts or thoughts of hurting anyone else or himself.  Plan: Return again in 1 weeks.  Diagnosis: Axis I: PTSD    Axis II: Deferred    Sabas Sous, [facility] 11/18/2013

## 2013-11-19 LAB — TSH: TSH: 1.172 u[IU]/mL (ref 0.350–4.500)

## 2013-11-19 LAB — LITHIUM LEVEL: Lithium Lvl: 0.4 mEq/L — ABNORMAL LOW (ref 0.80–1.40)

## 2013-11-25 ENCOUNTER — Encounter (HOSPITAL_COMMUNITY): Payer: Self-pay | Admitting: Behavioral Health

## 2013-11-25 ENCOUNTER — Ambulatory Visit (INDEPENDENT_AMBULATORY_CARE_PROVIDER_SITE_OTHER): Payer: 59 | Admitting: Behavioral Health

## 2013-11-25 DIAGNOSIS — F411 Generalized anxiety disorder: Secondary | ICD-10-CM

## 2013-11-25 NOTE — Progress Notes (Signed)
   THERAPIST PROGRESS NOTE  Session Time: 9:00  Participation Level: Active  Behavioral Response: CasualAlertAnxious  Type of Therapy: Individual Therapy  Treatment Goals addressed: Coping  Interventions: CBT  Summary: Stephen Myers is a 53 y.o. male who presents with anxiety.   Suicidal/Homicidal: Nowithout intent/plan  Therapist Response: The client was substantially brighter and less anxious in the session today. He indicated that his daughter was released the hospital and felt that her medications were working well. He indicated they spent a great day together yesterday and she greeted him with a hug this morning which had not happened a long time. He recognizes now the connection between his daughters wellness and his wellness. He also recognize the need to continue set boundaries with his daughter so as to break the enmeshment. He indicates that he will speak to his wife and they will sit down together in a positive encouraging way to set limits with their daughter. He was also happy that his wife does not have a job which she seems to be enjoying.  The patient reports that he is med compliant. He reports that he has no current thoughts of hurting himself or anyone else. He does contract for safety for me.  Plan: Return again in 1 weeks.  Diagnosis: Axis I: PTSD    Axis II: Deferred    Stephen Myers, [facility] 11/25/2013

## 2013-12-02 ENCOUNTER — Ambulatory Visit (INDEPENDENT_AMBULATORY_CARE_PROVIDER_SITE_OTHER): Payer: 59 | Admitting: Behavioral Health

## 2013-12-02 ENCOUNTER — Encounter (HOSPITAL_COMMUNITY): Payer: Self-pay | Admitting: Behavioral Health

## 2013-12-02 DIAGNOSIS — F4310 Post-traumatic stress disorder, unspecified: Secondary | ICD-10-CM

## 2013-12-02 NOTE — Progress Notes (Signed)
   THERAPIST PROGRESS NOTE  Session Time: 10:00  Participation Level: Active  Behavioral Response: CasualAlertAnxious  Type of Therapy: Individual Therapy  Treatment Goals addressed: Coping  Interventions: CBT  Summary: Stephen Myers is a 53 y.o. male who presents with anxiety.   Suicidal/Homicidal: Nowithout intent/plan  Therapist Response: The client presented in a much brighter mood today. He indicated that he had to make to phone calls this week and usually gradual exposure therapy procedure the we have been using saying that when she finally did number he was much more comfortable. Part of his goal is to reduce the amount of times that he has to dial part of the number of the number before actually allowing it to go through. He has one more phone call today which indicates that he will make this afternoon in regards to some investments.  He did say that his daughter is doing better in terms of medication but that she still has some fear from when she was hospitalized in a patient there who was suffering with some mental health issues and continued to tell the client that he loved her. The client said that the hospital did not react quickly enough to separate the 2 and a daughter even though she knows her fears are somewhat irrational is struggling with that someone. I encouraged him to be supportive but to allow her to work through that with her therapist. He continued to have conversations about the client and his wife setting firm boundaries with his daughter and setting expectations as there is some continuing enmeshment between the client and his daughter. He still indicates that he has some difficulty going upstairs while his daughter is there. He indicated his fear he can when his daughter was using drugs around the time her then boyfriend was using heroin before he overdosed and died. He indicates that he has images of walking into his daughter from in finding her lying on the  bed with a needle in her arm. We will begin to break that down in the next session. He does contract for safety saying he has no thoughts of hurting himself or anyone else.  Plan: Return again in 1 weeks.  Diagnosis: Axis I: PTSD    Axis II: Deferred    Runette Scifres M, LPC 12/02/2013

## 2013-12-09 ENCOUNTER — Ambulatory Visit (INDEPENDENT_AMBULATORY_CARE_PROVIDER_SITE_OTHER): Payer: 59 | Admitting: Behavioral Health

## 2013-12-09 ENCOUNTER — Encounter (HOSPITAL_COMMUNITY): Payer: Self-pay | Admitting: Behavioral Health

## 2013-12-09 DIAGNOSIS — F4310 Post-traumatic stress disorder, unspecified: Secondary | ICD-10-CM

## 2013-12-09 NOTE — Progress Notes (Signed)
   THERAPIST PROGRESS NOTE  Session Time: 10:00  Participation Level: Active  Behavioral Response: CasualAlertAnxious  Type of Therapy: Individual Therapy  Treatment Goals addressed: Coping  Interventions: CBT  Summary: Stephen Myers is a 53 y.o. male who presents with anxiety.   Suicidal/Homicidal: Nowithout intent/plan  Therapist Response: The client reported that he was able to make a phone call that we discussed in the last session although it took him a few times to actually do the complete number. He laughed saying that he actually got an answering machine and still alive voice. He said that he has been thinking about what he could do part time when he does feel he is well enough to go back to work. He recognizes that he is not there yet but it is a positive sign that he is thinking that he will be well enough to go back to work in some capacity. 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 He indicates he does not feel that would create anxiety.  He also says that he feels some shame and not being able to work as he thinks about it he recognizes that in some ways it makes him feel like his father who never actually held jobs for any length of time. He recognizes that this  Is a cognitive distortion and so use as a springboard to talk about what his value is and how he is different from his father in terms of probation. He also indicates that he has difficulty going to see his mother. He reports a great relationship with her and recognizes that a part of it is that he is somewhat guarded around his stepfather. He recognizes that his stepfather has been very good to his mother as well as the he and his brothers and his children. He states that his children see his stepfather as her grandfather and that his stepfather has treated him like he is the son even though they met with the client was in his 52s. I suggested that maybe  the difference was that he had never expressed appreciation to his stepfather for how good he had been to his mother and all of his family. His homework he is going to write notes to the stepfather named Bud telling him how much he appreciates how well he is good at all of his family. The client does contract for safety having thoughts of hurting himself or anyone else.  Plan: Return again in 1 weeks.  Diagnosis: Axis I: 309.81    Axis II: Deferred    [name] M, LPC 12/09/2013

## 2013-12-15 ENCOUNTER — Ambulatory Visit (INDEPENDENT_AMBULATORY_CARE_PROVIDER_SITE_OTHER): Payer: 59 | Admitting: Behavioral Health

## 2013-12-15 ENCOUNTER — Encounter (HOSPITAL_COMMUNITY): Payer: Self-pay | Admitting: Behavioral Health

## 2013-12-15 ENCOUNTER — Encounter (INDEPENDENT_AMBULATORY_CARE_PROVIDER_SITE_OTHER): Payer: Self-pay

## 2013-12-15 DIAGNOSIS — F4310 Post-traumatic stress disorder, unspecified: Secondary | ICD-10-CM

## 2013-12-15 NOTE — Progress Notes (Signed)
   THERAPIST PROGRESS NOTE  Session Time: 10:00  Participation Level: Active  Behavioral Response: CasualAlertAnxious  Type of Therapy: Individual Therapy  Treatment Goals addressed: Coping  Interventions: CBT  Summary: Stephen Myers is a 53 y.o. male who presents with anxiety.   Suicidal/Homicidal: Nowithout intent/plan  Therapist Response: The client made progress in terms of [place] employing gradual exposure therapy. He reported in the last session that a friend from his church small group had passed away and the funeral would be last week. He questioned then whether he would be able to go as he has not been able to overcome his anxiety go back to church. He reported that he was able to attend a funeral. He did say that the anxiety was extremely high and entering the church but as he sat down he recognized that he needed to focus on what the pastor was saying and respect and admiration that he had for his group member who had passed away and her husband. He has been debating continuing to be a part of the small group saying that it creates anxiety for him especially if he and his wife have to go to someone else's home. As we processed that time it appears that the client is still having significant difficulty focusing when trying to read something so he does not feel prepared when it comes time to meet with a Bible study. As we talked about allowing 10 minutes 3 times a day to complete his studies prior to the group he appeared to be relaxed and felt that he could make the effort to do that. He stated that because he is one of the quietest in  the group at times he feels" insignificant." Per the client it appears as if there are others in the group were also very quiet and it is not as much about what the client is wondering what people are thinking about him.  He was not able to write a letter to his stepfather but said that he will make that his goal for this week. He added as we are  finishing that he was also able to go into the drug store by himself to buy his wife's medication. He indicated that he use cognitive behavioral therapy techniques telling himself that he was doing it for his wife was not able to go get the medicine because of her long work day. I praised him for the efforts that he put in reminding him to encourage himself that he is making progress even if he feels minimal to him. Plan: Return again in 1 weeks.  Diagnosis: Axis I: PTSD    Axis II: Deferred    Sabas Sous, [facility] 12/15/2013

## 2013-12-22 ENCOUNTER — Ambulatory Visit (INDEPENDENT_AMBULATORY_CARE_PROVIDER_SITE_OTHER): Payer: 59 | Admitting: Psychiatry

## 2013-12-22 ENCOUNTER — Encounter (HOSPITAL_COMMUNITY): Payer: Self-pay | Admitting: Psychiatry

## 2013-12-22 VITALS — BP 122/80 | HR 80 | Ht 76.0 in | Wt 289.0 lb

## 2013-12-22 DIAGNOSIS — F4001 Agoraphobia with panic disorder: Secondary | ICD-10-CM

## 2013-12-22 DIAGNOSIS — F429 Obsessive-compulsive disorder, unspecified: Secondary | ICD-10-CM

## 2013-12-22 DIAGNOSIS — F4310 Post-traumatic stress disorder, unspecified: Secondary | ICD-10-CM

## 2013-12-22 DIAGNOSIS — F3189 Other bipolar disorder: Secondary | ICD-10-CM

## 2013-12-22 MED ORDER — CLONAZEPAM 1 MG PO TABS: 1.0000 mg | ORAL_TABLET | Freq: Two times a day (BID) | ORAL | Status: DC | PRN

## 2013-12-22 MED ORDER — ARIPIPRAZOLE 5 MG PO TABS: 5.0000 mg | ORAL_TABLET | Freq: Every day | ORAL | Status: DC

## 2013-12-22 MED ORDER — NALTREXONE HCL 50 MG PO TABS: 50.0000 mg | ORAL_TABLET | Freq: Every day | ORAL | Status: DC

## 2013-12-22 MED ORDER — ARIPIPRAZOLE 10 MG PO TABS: 10.0000 mg | ORAL_TABLET | Freq: Every day | ORAL | Status: DC

## 2013-12-22 NOTE — Progress Notes (Signed)
[facility] Follow-up Outpatient Visit  Stephen Myers [birth year]  Date: 12/22/2013  HPI Comments: Stephen Myers is a 53 Y/O male with a past psychiatric history significant for Major Depression, recurrent, moderate. Obsessive Compulsive Disorder, Anxiety Disorder, NOS. The patient is referred for psychiatric services for medication management.   Stephen Myers patient reports he had some emotional difficulty regarding the death of a member of her church. He also reports stress over the decompensation of her daughter's health in the past month.  . Quality: Stephen Myers reports his anxiety escalates with concerns that his daughter, who lives with them, will relapse to drug use. He states he has had to use clonazepam more frequently due to anxiety. He reports he is taking his medication and denies any side effects.   The patient denies current suicidal ideation, intent, or plans. He reports he had suicidal thoughts last week which was brief, related to his thoughts that his wife and daughter would be better off without him.  He  denies any current homicidal ideation, plans or intent. Patient denies auditory hallucinations. Patient denies visual hallucinations. The patient reports a reduction in paranoia. Patient states sleep has improved, with 8 hours per night. Energy level slightly improved.    . Severity: Depression: 4/10 (0=Very depressed; 5=Neutral; 10=Very Happy)  Anxiety- 6/10 (0=no anxiety; 5= moderate/tolerable anxiety; 10= panic attacks)  . Duration: More than 1 year, anxiety is waxing and waning but has worsened overall.   . Timing: Mornings are worse,but symptoms present throughout the day.  . Context: Social interactions, daughter's  mental health.  . Modifying factors: Improved with spending time with his wife.  . Associated signs and symptoms: Denies any recent episodes consistent with mania, particularly decreased need for sleep with increased energy, grandiosity,  impulsivity, hyperverbal and pressured speech, or increased productivity. Denies any recent symptoms consistent with psychosis, particularly auditory or visual hallucinations, thought broadcasting/insertion/withdrawal, or ideas of reference.   Review of Systems  Constitutional: Negative for fever, chills and weight loss.  Respiratory: Positive for shortness of breath (With panic attacks.). Negative for cough, sputum production and wheezing.   Cardiovascular: Negative for chest pain, palpitations and leg swelling.  Gastrointestinal: Negative for nausea, vomiting, abdominal pain, diarrhea and constipation.  Musculoskeletal:       No gait abnormalities or muscle weakness.  Neurological: Positive for sensory change (Burning sensation in leg. ).   Filed Vitals:   12/22/13 1025  BP: 122/80  Pulse: 80  Height: 6\' 4"  (1.93 m)  Weight: 289 lb (131.09 kg)    Physical Exam  Constitutional: He appears well-developed and well-nourished. No acute physical distress.  Skin: He is not diaphoretic. Musculoskeletal: Gait & Station: normal Patient leans: N/A'    HISTORY OF SUICIDAL ACTS AND SELF-HARM:  None.   HISTORY OF VIOLENCE/ASSAULTING OTHERS/LEGAL PROBLEMS:  No current legal issues.   SUBSTANCE USE HISTORY:  Caffeine: Coffee 2 cups per day. Caffeinated Beverages (green tea) 16 ounces per day.  Nicotine: Patient denies.  Alcohol:  Patient  denies Drugs: Patient denies.   MENTAL ILLNESS AND SUBSTANCE ABUSE IN FAMILY MEMBERS: Reviewed  Psychiatric illness: Father-Bipolar Disorder. Mother-depression.  Substance abuse: Paternal-Alcoholism  Suicides: Patient denied   MILITARY HISTORY:  [employer]   Allergies: No Known Allergies   MEDICAL INFORMATION  Past Medical History  Diagnosis Date  . Irritability and anger     symptoms at home and work  . Anxiety   . Depression    b) CURRENT PRESCRIBED MEDICATIONS:  Current Outpatient Prescriptions on File Prior to Visit  Medication Sig  Dispense Refill  . ARIPiprazole (ABILIFY) 10 MG tablet Take 1 tablet (10 mg total) by mouth at bedtime. For adjuvant therapy for depression.  30 tablet  1  . aspirin 81 MG tablet Take 1 tablet (81 mg total) by mouth daily.  30 tablet    . atorvastatin (LIPITOR) 40 MG tablet Take 1 tablet (40 mg total) by mouth daily. For cholesterol.      . clonazePAM (KLONOPIN) 1 MG tablet Take 1 mg by mouth as directed. Take one tablet in the morning and one-half tablet at bedtime.      Marland Kitchen lithium 600 MG capsule TAKE ONE CAPSULE BY MOUTH AT BEDTIME  30 capsule  1  . losartan-hydrochlorothiazide (HYZAAR) 50-12.5 MG per tablet TAKE 1 TABLET BY MOUTH DAILY.  90 tablet  1  . naltrexone (DEPADE) 50 MG tablet Take 1 tablet (50 mg total) by mouth daily. For adjunctive therapy for depression.  30 tablet  1  . vitamin B-12 (CYANOCOBALAMIN) 500 MCG tablet Take 2 tablets (1,000 mcg total) by mouth daily. For low iron deficiency.      . [DISCONTINUED] clomiPRAMINE (ANAFRANIL) 25 MG capsule Take 1 capsule (25 mg total) by mouth at bedtime.  30 capsule  0   No current facility-administered medications on file prior to visit.    e) CURRENT OTC MEDICATIONS  1)  Vit B   ALLERGIES/ ADVERSE REACTIONS:  1) None   Psychiatric Specialty Examination:  Objective: Appearance: Fairly Groomed, Patient is tearful.  Eye Contact: fair   Speech: Nonrmal  Volume: Normal   Mood: "anxious"     Affect: Full range, congruent   Thought Process: Coherent and Goal Directed   Orientation: Oriented to person, place, year, and month, got date wrong.  Thought Content: Within normal limits with the exception of paranoia regarding people taking advantage of him.   Suicidal Thoughts: Patient denies suicidal ideation, intent or plans today.   Homicidal Thoughts: No   Memory: Immediate; Good  Recent:   Judgement: Good   Insight: Fair   Psychomotor Activity: Normal   Concentration:Fair   Memory: intact immediate 3/3; impaired recent 1/3    Akathisia: No   Handed: Right   AIMS (if indicated): as noted in chart   Assets:  Desire for Improvement  Financial Resources/Insurance  Housing  Social Support    Lab Results: No results found for this or any previous visit (from the past 48 hour(S)).   SUMMARY AND FORMULATION:   ASSESSMENT:  Posttruamatic stress disorder, stable Panic Disorder with Agoraphobia-mild improvement.  Axis I: Post Traumatic Stress disorder-unimproved Panic Disorder with Agoraphobia-unimproved Obsessive Compulsive Disorder-stable Bipolar II Disorder-some worsening. Axis II: No diagnosis.  Axis III: No diagnosis  Axis IV: Job related stressors, familial stressors.  Axis V: GAF: 40  PLAN:   Plan of Care:  PLAN:  1. Affirm with the patient that the medications are taken as ordered. Patient  expressed understanding of how their medications were to be used.    Laboratory:  No labs warranted at this time.    Psychotherapy: Therapy: brief supportive therapy provided. Discussed psychosocial stressors. Continue individual outpatient therapy.   Will refer to IOP if necessary based on symptoms. More than 50% of the visit was spent on individual therapy/counseling.   Medications:  Continue the following psychiatric medications as written prior to this appointment:  A) Continue naltrexone 50 mg daily-No change in dose. B) Will increase clonazepam 1  mg QAM and  QHS.   C) Will increase Abilify to 12.5 mg daily. Will consider increase in this medication prior to his next visit to 15 mg . D) Continue Lithium 600 mg-No change in dose. E) Given his current severity of symptoms we are in the process of application for long term disability. -Risks and benefits, side effects and alternatives discussed with patient, he was given an opportunity to ask questions about his medication, illness, and treatment. All current psychiatric medications have been reviewed and discussed with the patient and adjusted as  clinically appropriate. The patient has been provided an accurate and updated list of the medications being now prescribed.   Routine PRN Medications:  Negative  Consultations: The patient was encouraged to keep all PCP and specialty clinic appointments.   Safety Concerns:   Patient told to call clinic if any problems occur. Patient advised to go to  ER  if he should develop SI/HI, side effects, or if symptoms worsen. Has crisis numbers to call if needed.    Other:   8. Patient was instructed to return to clinic in 4-6 weeks.  9. The patient was advised to call and cancel their mental health appointment within 24 hours of the appointment, if they are unable to keep the appointment, as well as the three no show and termination from clinic policy. 10. The patient expressed understanding of the plan and agrees with the above. 11. Patient informed that April 15th, 2015 would be my last day at this clinic.   Time Spent: 30 minutes Coralyn Helling, M.D.  12/22/2013 10:23 AM

## 2013-12-23 ENCOUNTER — Encounter (HOSPITAL_COMMUNITY): Payer: Self-pay | Admitting: Behavioral Health

## 2013-12-23 ENCOUNTER — Ambulatory Visit (INDEPENDENT_AMBULATORY_CARE_PROVIDER_SITE_OTHER): Payer: 59 | Admitting: Behavioral Health

## 2013-12-23 DIAGNOSIS — F4310 Post-traumatic stress disorder, unspecified: Secondary | ICD-10-CM

## 2013-12-23 NOTE — Progress Notes (Signed)
   THERAPIST PROGRESS NOTE  Session Time: 10:00  Participation Level: Active  Behavioral Response: CasualAlertAnxious  Type of Therapy: Individual Therapy  Treatment Goals addressed: Coping  Interventions: CBT  Summary: Stephen Myers is a 53 y.o. male who presents with anxiety.   Suicidal/Homicidal: Nowithout intent/plan  Therapist Response: The client indicates that the past week has been good in several ways. He and his daughter have been home together and felt they have connected in ways that is building a relationship. He reports that his daughter is doing better physically and they're attempting to make medication changes which may help with the stomach issues and he can tell she physically feels better.  He did say that he has been thinking about work a lot and had dreams about it. 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 He said that he and with him walking out are arguing with the client. He has 2 theories about the dreams. The first is that his wife started working and comes home happy and recognizes that even though he is happy for her he misses working. He is feeling some guilt about not being able to provide but has begun to realize that this is not choice and I do not feel that he is being too hard on himself. 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 He does continue to report with considerable social anxiety. 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 He does contract for safety saying he has no thoughts of hurting himself or anyone else.  Plan: Return again in 1 weeks.  Diagnosis: Axis I: PTSD    Axis II: Deferred    Sabas Sous, LPC 12/23/2013

## 2013-12-24 ENCOUNTER — Other Ambulatory Visit (HOSPITAL_COMMUNITY): Payer: Self-pay | Admitting: Psychiatry

## 2013-12-29 ENCOUNTER — Ambulatory Visit (HOSPITAL_COMMUNITY): Payer: Self-pay | Admitting: Behavioral Health

## 2013-12-30 NOTE — Telephone Encounter (Signed)
Refilled naltrexone.

## 2014-01-05 ENCOUNTER — Ambulatory Visit (INDEPENDENT_AMBULATORY_CARE_PROVIDER_SITE_OTHER): Payer: 59 | Admitting: Behavioral Health

## 2014-01-05 ENCOUNTER — Encounter (HOSPITAL_COMMUNITY): Payer: Self-pay | Admitting: Behavioral Health

## 2014-01-05 DIAGNOSIS — F4310 Post-traumatic stress disorder, unspecified: Secondary | ICD-10-CM

## 2014-01-05 NOTE — Progress Notes (Signed)
   THERAPIST PROGRESS NOTE  Session Time: 10:30  Participation Level: Active  Behavioral Response: CasualAlertAnxious  Type of Therapy: Individual Therapy  Treatment Goals addressed: Coping  Interventions: CBT  Summary: Stephen Myers is a 53 y.o. male who presents with anxiety.   Suicidal/Homicidal: Nowithout intent/plan  Therapist Response: The client presented today with moderate anxiety. He did complete his homework and that he the letter to his stepfather saying it was the emotional experience for him. He indicated that he will either take the letter to his stepfather or put it in the mail to him. We did process why going to see his mother and stepfather it creates more anxiety going to see any other family members. He is able to still go and visit and feels supported by both but cannot understand the reason for the increased anxiety. He indicates that he has some concern for his daughter. He indicates that in some way she is doing much better but feels that she is becoming a little too dependent on some of the medication that she is on. I advised him to be proactive in talking to her about it and to her doctor about it if at all possible so that it does not affect him negatively. We added some new grounding or anxiety reduction coping skills to his toolbox. He does contract for safety having no thoughts of hurting himself or anyone else. He does still present with significant anxiety especially socially. He indicates that he is thinking more about work not in terms of being able to but knowing that he would like to begin some day at all possible but knows that socially he is not able to stay calm enough to be able to work in public were either by phone.  Plan: Return again in 1 weeks.  Diagnosis: Axis I: PTSD    Axis II: Deferred    Nance Mccombs M, [facility] 01/05/2014

## 2014-01-09 ENCOUNTER — Other Ambulatory Visit (HOSPITAL_COMMUNITY): Payer: Self-pay | Admitting: Psychiatry

## 2014-01-12 ENCOUNTER — Ambulatory Visit (INDEPENDENT_AMBULATORY_CARE_PROVIDER_SITE_OTHER): Payer: 59 | Admitting: Behavioral Health

## 2014-01-12 ENCOUNTER — Encounter (HOSPITAL_COMMUNITY): Payer: Self-pay | Admitting: Behavioral Health

## 2014-01-12 DIAGNOSIS — F411 Generalized anxiety disorder: Secondary | ICD-10-CM

## 2014-01-12 NOTE — Progress Notes (Signed)
   THERAPIST PROGRESS NOTE  Session Time: 9:00  Participation Level: Active  Behavioral Response: CasualAlertAnxious  Type of Therapy: Individual Therapy  Treatment Goals addressed: Coping  Interventions: CBT  Summary: Stephen Myers is a 53 y.o. male who presents with anxiety.   Suicidal/Homicidal: Nowithout intent/plan  Therapist Response: The client was fairly bright and only mild to moderately anxious in session. He reported that his daughter will start intensive outpatient therapy next week they saw her psychiatrist recommendations. Has clearly been a shift in the clients enmeshment with his daughter. He is still concerned about her feeling that she is not making significant progress but his well-being is not as connected to hers as it has been in the past. We talked about how he had intentionally and unintentionally set boundaries with her which he says has been helpful.  Indicated that he with his mother so was also able to work outside in the yard some. He stated that he did not know if he could done that would as brothers they are but that he felt confident enough in the middle of the day to be outside in his yard and his brothers are to do that type of work.  The client has made significant progress and reduction of anxiety. He does contract for safety having no thoughts of hurting himself or anyone else.  Plan: Return again in 1 weeks.  Diagnosis: Axis I: 300.02    Axis II: Deferred    Sabas Sous, [facility] 01/12/2014

## 2014-01-14 ENCOUNTER — Encounter: Payer: Self-pay | Admitting: Emergency Medicine

## 2014-01-14 ENCOUNTER — Emergency Department (INDEPENDENT_AMBULATORY_CARE_PROVIDER_SITE_OTHER)
Admission: EM | Admit: 2014-01-14 | Discharge: 2014-01-14 | Disposition: A | Discharge status: Home / Self Care | Payer: 59 | Source: Home / Self Care | Attending: Family Medicine | Admitting: Family Medicine

## 2014-01-14 DIAGNOSIS — H01001 Unspecified blepharitis right upper eyelid: Secondary | ICD-10-CM

## 2014-01-14 DIAGNOSIS — H01009 Unspecified blepharitis unspecified eye, unspecified eyelid: Secondary | ICD-10-CM

## 2014-01-14 MED ORDER — AZITHROMYCIN 1 % OP SOLN: 1.0000 [drp] | Freq: Every day | OPHTHALMIC | Status: DC

## 2014-01-14 MED ORDER — DOXYCYCLINE HYCLATE 100 MG PO CAPS: 100.0000 mg | ORAL_CAPSULE | Freq: Two times a day (BID) | ORAL | Status: AC

## 2014-01-14 NOTE — ED Provider Notes (Signed)
CSN: [id number]     Arrival date & time 01/14/14  1206 History   First MD Initiated Contact with Patient 01/14/14 1216     Chief Complaint  Patient presents with  . Belepharitis    HPI  R upper eyelid swelling and redness x1 day  No known injury, though pt states that he was cutting down tree 2 days ago.  Denies any eye pain.  No LOV.  Mild swelling No headache or photophobia.   Past Medical History  Diagnosis Date  . Irritability and anger     symptoms at home and work  . Anxiety   . Depression    Past Surgical History  Procedure Laterality Date  . Appendectomy    . Tonsillectomy    . Cosmetic surgery     Family History  Problem Relation Age of Onset  . Depression Mother   . Cancer Mother     breast  . Bipolar disorder Father   . Stroke Father   . Depression Brother   . Depression Maternal Grandmother   . Bipolar disorder Daughter   . Drug abuse Daughter    History  Substance Use Topics  . Smoking status: Former Smoker -- 0.00 packs/day for 15 years    Types: Cigarettes    Quit date: 11/03/2000  . Smokeless tobacco: Never Used  . Alcohol Use: 0.0 oz/week    0 drink(s) per week     Comment: rarely-2 drinks 12/2013    Review of Systems  All other systems reviewed and are negative.    Allergies  Review of patient's allergies indicates no known allergies.  Home Medications   Current Outpatient Rx  Name  Route  Sig  Dispense  Refill  . ARIPiprazole (ABILIFY) 10 MG tablet   Oral   Take 1 tablet (10 mg total) by mouth at bedtime. For adjuvant therapy for depression.   30 tablet   1   . ARIPiprazole (ABILIFY) 5 MG tablet   Oral   Take 1 tablet (5 mg total) by mouth daily. Take with 10 mg. Take one-half tablet for 7 days, then increase to one tablet daily.   30 tablet   1   . aspirin 81 MG tablet   Oral   Take 1 tablet (81 mg total) by mouth daily.   30 tablet      . atorvastatin (LIPITOR) 40 MG tablet   Oral   Take 1 tablet (40 mg total) by  mouth daily. For cholesterol.         . clonazePAM (KLONOPIN) 1 MG tablet   Oral   Take 1 tablet (1 mg total) by mouth 2 (two) times daily as needed for anxiety.   60 tablet   1     Discontinue all previous prescriptions for clonaze ...   . lithium 600 MG capsule      TAKE ONE CAPSULE BY MOUTH AT BEDTIME   30 capsule   1   . losartan-hydrochlorothiazide (HYZAAR) 50-12.5 MG per tablet      TAKE 1 TABLET BY MOUTH DAILY.   90 tablet   1   . naltrexone (DEPADE) 50 MG tablet      TAKE 1 TABLET BY MOUTH EVERY DAY   30 tablet   1   . vitamin B-12 (CYANOCOBALAMIN) 500 MCG tablet   Oral   Take 2 tablets (1,000 mcg total) by mouth daily. For low iron deficiency.          BP 117/77  Pulse 70  Temp(Src) 98 F (36.7 C) (Oral)  Resp 16  Ht 6\' 4"  (1.93 m)  Wt 283 lb (128.368 kg)  BMI 34.46 kg/m2  SpO2 96% Physical Exam  Constitutional: He appears well-nourished.  HENT:  Head: Normocephalic and atraumatic.    + R upper eyelid swelling and erythema, mild  No purulent drainage  No photophobia    Eyes: Conjunctivae are normal. Pupils are equal, round, and reactive to light.  Neck: Normal range of motion. Neck supple.  Cardiovascular: Normal rate and regular rhythm.   Pulmonary/Chest: Effort normal and breath sounds normal.  Abdominal: Soft.  Musculoskeletal: Normal range of motion.  Neurological: He is alert.  Skin: Skin is warm.    ED Course  Procedures (including critical care time) Labs Review Labs Reviewed - No data to display Imaging Review No results found.   MDM   1. Blepharitis of right upper eyelid    Will treat with azithromycin op and doxycycline.  Reassuring exam. Eye exam WNL Discussed infectious and op red flags.  Follow up as needed.     The patient and/or caregiver has been counseled thoroughly with regard to treatment plan and/or medications prescribed including dosage, schedule, interactions, rationale for use, and possible side  effects and they verbalize understanding. Diagnoses and expected course of recovery discussed and will return if not improved as expected or if the condition worsens. Patient and/or caregiver verbalized understanding.        Shanda Howells, MD 01/14/14 [phone]

## 2014-01-14 NOTE — ED Notes (Signed)
Right eye lid edematous and reddened; thinks may be poison ivy since exposed to it 2 days prior to onset. No OTCs.

## 2014-01-31 ENCOUNTER — Encounter (HOSPITAL_COMMUNITY): Payer: Self-pay | Admitting: Psychiatry

## 2014-01-31 ENCOUNTER — Ambulatory Visit (INDEPENDENT_AMBULATORY_CARE_PROVIDER_SITE_OTHER): Payer: 59 | Admitting: Psychiatry

## 2014-01-31 VITALS — BP 110/71 | HR 66 | Wt 285.0 lb

## 2014-01-31 DIAGNOSIS — F4001 Agoraphobia with panic disorder: Secondary | ICD-10-CM

## 2014-01-31 DIAGNOSIS — F429 Obsessive-compulsive disorder, unspecified: Secondary | ICD-10-CM

## 2014-01-31 DIAGNOSIS — F4310 Post-traumatic stress disorder, unspecified: Secondary | ICD-10-CM

## 2014-01-31 DIAGNOSIS — F3189 Other bipolar disorder: Secondary | ICD-10-CM

## 2014-01-31 MED ORDER — NALTREXONE HCL 50 MG PO TABS: ORAL_TABLET | ORAL | Status: DC

## 2014-01-31 MED ORDER — CLONAZEPAM 1 MG PO TABS: 1.0000 mg | ORAL_TABLET | Freq: Two times a day (BID) | ORAL | Status: DC | PRN

## 2014-01-31 MED ORDER — ARIPIPRAZOLE 10 MG PO TABS: 10.0000 mg | ORAL_TABLET | Freq: Every day | ORAL | Status: DC

## 2014-01-31 MED ORDER — LITHIUM CARBONATE 600 MG PO CAPS: ORAL_CAPSULE | ORAL | Status: DC

## 2014-01-31 NOTE — Progress Notes (Signed)
[facility] Follow-up Outpatient Visit  Stephen Myers 1961-04-11  Date: 01/31/2014  HPI Comments: [name] is a 53 Y/O male with a past psychiatric history significant for Major Depression, recurrent, moderate. Obsessive Compulsive Disorder, Anxiety Disorder, NOS. The patient is referred for psychiatric services for medication management.   Stephen Myers patient reports that he continues to have good and bad days.  He worries about his daughter.  . Quality: Mr. Arneson reports he continues to have anxiety. He continues to have depression. His main stress is related to his daughter. He reports his mood had worsened over the winter. He reports he is taking his medication and denies any side effects.   The patient denies current suicidal ideation, intent, or plans. He reports he has less frequent suicidal thoughts weekly which was brief, related to his thoughts that his wife and daughter would be better off without him.  He  denies any current homicidal ideation, plans or intent. Patient denies auditory hallucinations. Patient denies visual hallucinations. The patient reports a reduction in paranoia. Patient states sleep has improved, with 9-10 hours per night. Energy level is low.    . Severity: Depression: 4/10 (0=Very depressed; 5=Neutral; 10=Very Happy)  Anxiety- 7/10 (0=no anxiety; 5= moderate/tolerable anxiety; 10= panic attacks)  . Duration: More than 1 year, anxiety is waxing and waning but has worsened overall.   . Timing: Mornings are worse,but symptoms present throughout the day.  . Context: Social interactions, daughter's  mental health.  . Modifying factors: Improved with spending time with his wife.  . Associated signs and symptoms: Denies any recent episodes consistent with mania, particularly decreased need for sleep with increased energy, grandiosity, impulsivity, hyperverbal and pressured speech, or increased productivity. Denies any recent symptoms  consistent with psychosis, particularly auditory or visual hallucinations, thought broadcasting/insertion/withdrawal, or ideas of reference.   Review of Systems  Constitutional: Negative for fever, chills and weight loss.  Respiratory: Positive for shortness of breath (With panic attacks.). Negative for cough, sputum production and wheezing.   Cardiovascular: Negative for chest pain, palpitations and leg swelling.  Gastrointestinal: Negative for nausea, vomiting, abdominal pain, diarrhea and constipation.  Musculoskeletal:       No gait abnormalities or muscle weakness.  Neurological: Positive for sensory change (Burning sensation in leg. ).   Filed Vitals:   01/31/14 1126  BP: 110/71  Pulse: 66  Weight: 285 lb (129.275 kg)    Physical Exam  Constitutional: He appears well-developed and well-nourished. No acute physical distress.  Skin: He is not diaphoretic. Musculoskeletal: Gait & Station: normal Patient leans: N/A'    HISTORY OF SUICIDAL ACTS AND SELF-HARM:  None.   HISTORY OF VIOLENCE/ASSAULTING OTHERS/LEGAL PROBLEMS:  No current legal issues.   SUBSTANCE USE HISTORY:  Caffeine: Coffee 2 cups per day. Caffeinated Beverages (green tea) 16 ounces per day.  Nicotine: Patient denies.  Alcohol:  Patient  denies Drugs: Patient denies.   MENTAL ILLNESS AND SUBSTANCE ABUSE IN FAMILY MEMBERS: Reviewed  Psychiatric illness: Father-Bipolar Disorder. Mother-depression.  Substance abuse: Paternal-Alcoholism  Suicides: Patient denied   MILITARY HISTORY:  [employer]   Allergies: No Known Allergies   MEDICAL INFORMATION  Past Medical History  Diagnosis Date  . Irritability and anger     symptoms at home and work  . Anxiety   . Depression    b) CURRENT PRESCRIBED MEDICATIONS:  Current Outpatient Prescriptions on File Prior to Visit  Medication Sig Dispense Refill  . ARIPiprazole (ABILIFY) 10 MG tablet Take 1  tablet (10 mg total) by mouth at bedtime. For adjuvant therapy  for depression.  30 tablet  1  . aspirin 81 MG tablet Take 1 tablet (81 mg total) by mouth daily.  30 tablet    . atorvastatin (LIPITOR) 40 MG tablet Take 1 tablet (40 mg total) by mouth daily. For cholesterol.      . clonazePAM (KLONOPIN) 1 MG tablet Take 1 tablet (1 mg total) by mouth 2 (two) times daily as needed for anxiety.  60 tablet  1  . lithium 600 MG capsule TAKE ONE CAPSULE BY MOUTH AT BEDTIME  30 capsule  1  . losartan-hydrochlorothiazide (HYZAAR) 50-12.5 MG per tablet TAKE 1 TABLET BY MOUTH DAILY.  90 tablet  1  . naltrexone (DEPADE) 50 MG tablet TAKE 1 TABLET BY MOUTH EVERY DAY  30 tablet  1  . vitamin B-12 (CYANOCOBALAMIN) 500 MCG tablet Take 2 tablets (1,000 mcg total) by mouth daily. For low iron deficiency.      . [DISCONTINUED] clomiPRAMINE (ANAFRANIL) 25 MG capsule Take 1 capsule (25 mg total) by mouth at bedtime.  30 capsule  0   No current facility-administered medications on file prior to visit.    e) CURRENT OTC MEDICATIONS  1)  Vit B   ALLERGIES/ ADVERSE REACTIONS:  1) None   Psychiatric Specialty Examination:  Objective: Appearance: Fairly Groomed, Patient is tearful.  Eye Contact: fair   Speech: Nonrmal  Volume: Normal   Mood: " I'm having good days and bad days, more worried"     Affect: Full range, congruent   Thought Process: Coherent and Goal Directed   Orientation: Oriented to person, place, time.  Thought Content: Within normal limits with the exception of paranoia regarding people taking advantage of him.   Suicidal Thoughts: Patient denies suicidal ideation, intent or plans today.   Homicidal Thoughts: No   Memory: Immediate; Good  Recent:   Judgement: Good   Insight: Fair   Psychomotor Activity: Normal   Concentration:Fair   Memory: intact immediate 3/3; impaired recent 2/3   Akathisia: No   Handed: Right   [place] of knowledge-average to above average  AIMS (if indicated): AIMS=0  Assets:  Desire for Improvement   Financial Resources/Insurance  Housing  Social Support    Lab Results: No results found for this or any previous visit (from the past 48 hour(S)).   SUMMARY AND FORMULATION:   ASSESSMENT:   Axis I: Post Traumatic Stress disorder-unimproved Panic Disorder with Agoraphobia-some improvement Obsessive Compulsive Disorder-stable Bipolar II Disorder-some worsening. Axis II: No diagnosis.  Axis III: No diagnosis  Axis IV: Familial stressors.  Axis V: GAF: 40  PLAN:   Plan of Care:  PLAN:  1. Affirm with the patient that the medications are taken as ordered. Patient  expressed understanding of how their medications were to be used.    Laboratory:  No labs warranted at this time.    Psychotherapy: Therapy: brief supportive therapy provided. Discussed psychosocial stressors. Continue individual outpatient therapy.   Will refer back to IOP if necessary based on symptoms. More than 50% of the visit was spent on individual therapy/counseling.   Medications:  Continue the following psychiatric medications as written prior to this appointment:  A) Continue naltrexone 50 mg daily-No change in dose. B) Will  clonazepam 1 mg QAM and  QHS.   C) Will increase Abilify 15 mg. D) Continue Lithium 600 mg-No change in dose. E) Given his current severity of symptoms we are in  the process of application for long term disability. -Risks and benefits, side effects and alternatives discussed with patient, he was given an opportunity to ask questions about his medication, illness, and treatment. All current psychiatric medications have been reviewed and discussed with the patient and adjusted as clinically appropriate. The patient has been provided an accurate and updated list of the medications being now prescribed.   Routine PRN Medications:  Negative  Consultations: The patient was encouraged to keep all PCP and specialty clinic appointments.   Safety Concerns:   Patient told to call clinic if any  problems occur. Patient advised to go to  ER  if he should develop SI/HI, side effects, or if symptoms worsen. Has crisis numbers to call if needed.    Other:   8. Patient was instructed to return to clinic in 4-6 weeks.  9. The patient was advised to call and cancel their mental health appointment within 24 hours of the appointment, if they are unable to keep the appointment, as well as the three no show and termination from clinic policy. 10. The patient expressed understanding of the plan and agrees with the above. 11. Patient informed that April 15th, 2015 would be my last day at this clinic.   Time Spent: 30 minutes Coralyn Helling, M.D.  01/31/2014 11:19 AM

## 2014-03-20 ENCOUNTER — Other Ambulatory Visit: Payer: Self-pay | Admitting: Sports Medicine

## 2014-04-12 ENCOUNTER — Encounter (HOSPITAL_COMMUNITY): Payer: Self-pay | Admitting: Physician Assistant

## 2014-04-12 ENCOUNTER — Ambulatory Visit (INDEPENDENT_AMBULATORY_CARE_PROVIDER_SITE_OTHER): Payer: 59 | Admitting: Physician Assistant

## 2014-04-12 VITALS — BP 139/72 | HR 70 | Ht 76.0 in | Wt 289.0 lb

## 2014-04-12 DIAGNOSIS — F4310 Post-traumatic stress disorder, unspecified: Secondary | ICD-10-CM

## 2014-04-12 DIAGNOSIS — F411 Generalized anxiety disorder: Secondary | ICD-10-CM

## 2014-04-12 DIAGNOSIS — F339 Major depressive disorder, recurrent, unspecified: Secondary | ICD-10-CM

## 2014-04-12 MED ORDER — LITHIUM CARBONATE 300 MG PO CAPS: ORAL_CAPSULE | ORAL | Status: DC

## 2014-04-12 MED ORDER — CLONAZEPAM 1 MG PO TABS: 1.0000 mg | ORAL_TABLET | Freq: Two times a day (BID) | ORAL | Status: DC | PRN

## 2014-04-12 MED ORDER — ARIPIPRAZOLE 10 MG PO TABS: 15.0000 mg | ORAL_TABLET | Freq: Every day | ORAL | Status: DC

## 2014-04-12 NOTE — Patient Instructions (Signed)
Patient is to return to the office in 2 weeks. He will call the office in 1 week for phone follow up. He will call the office sooner if he feels that his symptoms are worsening and he is aware of the parameters of worsening.

## 2014-04-12 NOTE — Progress Notes (Signed)
   [facility] Follow-up Outpatient Visit  Stephen Myers [birth year]  Date: 2014/04/13 DX: PTSD        MDD        GAD        Social Anxiety  Subjective: Met with [name] today to follow up on his MDD and PTSD. He is well known to me from his hospitalization at [facility] in September of 2014. He has not been back in the hospital since his last admission and notes that he has done fairly well until his daughter had an MVA. She had some minor injuries but did not require being in the hospital. This brought back an increase in anxiety and depression for him as she is his main source of depression due to her heroin use. She has been clean for over a year.       [name] notes that his thoughts of SI are passive and fleeting but rates his depression at a 5-6/10.  His anxiety is a 7/10. He has not been able to see his therapist as he is out on maternity leave and will return to the clinic in late June. Tressa Busman [phone])        He has no previous attempts at suicide and no plans to do so currently. His love for his family, his faith keep him from acting on these feelings. He says "I'd be better off dead."  Filed Vitals:   04-13-14 0858  BP: 139/72  Pulse: 70    Mental Status Examination  Appearance: Casual and clean Alert: Yes Attention: good  Cooperative: Yes Eye Contact: Good Speech: clear and goal directed Psychomotor Activity: Restlessness Memory/Concentration: good Oriented: person, place and time/date Mood: Anxious and Depressed Affect: Congruent and Tearful Thought Processes and Associations: Goal Directed Fund of Knowledge: Good Thought Content: Suicidal ideation passive with no plans, no intent Insight: Good Judgement: Good  Diagnosis: MDD recurrent w/o mention of psychosis.                     PTSD with some flashbacks                     GAD  Treatment Plan:  The patient is offered the option of going to the hospital, IOP, or close follow up. He elects to have  close follow up and medication management. Together we have identified factors that will indicate he is in need of current hospitalization as worsening depression, increasing SI, worsening anxiety.  1. Increase lithium to 300 at lunch, 647m at hs. 2. Increased abilify to 155mat hs. 3. Continue klonopin as written. 4. Patient is to call this office in 1 week to follow up. 5. Will return to this office in 2 weeks for visual follow up 6. Patient will call office if his symptoms worsen or go to the nearest ED.  NeMarlane HatcherMashburn RPAC 9:55 AM 04/08/10/15

## 2014-04-13 ENCOUNTER — Telehealth (HOSPITAL_COMMUNITY): Payer: Self-pay | Admitting: Physician Assistant

## 2014-04-13 LAB — LITHIUM LEVEL: LITHIUM LVL: 0.4 meq/L — AB (ref 0.80–1.40)

## 2014-04-14 NOTE — Telephone Encounter (Signed)
Phone call to patient to inform him of lab results. Stephen Myers. Myosha Cuadras RPAC 11:05 AM 04/14/2014

## 2014-04-19 ENCOUNTER — Telehealth (HOSPITAL_COMMUNITY): Payer: Self-pay

## 2014-04-19 DIAGNOSIS — F4310 Post-traumatic stress disorder, unspecified: Secondary | ICD-10-CM

## 2014-04-20 MED ORDER — NALTREXONE HCL 50 MG PO TABS: ORAL_TABLET | ORAL | Status: AC

## 2014-04-20 NOTE — Telephone Encounter (Signed)
Spoke with patient today for his Depade refill and to follow up on his mood and suicidal ideation. He is doing well with the increase in Lithium. States he has had no further SI since increase. He will keep his follow up. Marlane Hatcher. Mashburn RPAC 9:19 AM 04/20/2014

## 2014-04-26 ENCOUNTER — Encounter (HOSPITAL_COMMUNITY): Payer: Self-pay | Admitting: Physician Assistant

## 2014-04-26 ENCOUNTER — Ambulatory Visit (INDEPENDENT_AMBULATORY_CARE_PROVIDER_SITE_OTHER): Payer: 59 | Admitting: Physician Assistant

## 2014-04-26 VITALS — BP 145/89 | HR 84 | Ht 76.0 in | Wt 287.0 lb

## 2014-04-26 DIAGNOSIS — F4310 Post-traumatic stress disorder, unspecified: Secondary | ICD-10-CM

## 2014-04-26 DIAGNOSIS — F4001 Agoraphobia with panic disorder: Secondary | ICD-10-CM

## 2014-04-26 DIAGNOSIS — F411 Generalized anxiety disorder: Secondary | ICD-10-CM

## 2014-04-26 DIAGNOSIS — F429 Obsessive-compulsive disorder, unspecified: Secondary | ICD-10-CM

## 2014-04-26 DIAGNOSIS — F331 Major depressive disorder, recurrent, moderate: Secondary | ICD-10-CM

## 2014-04-26 DIAGNOSIS — F329 Major depressive disorder, single episode, unspecified: Secondary | ICD-10-CM

## 2014-04-26 NOTE — Patient Instructions (Addendum)
Treatment Plan:  1. Get Lithium level today. 2. Continue with OPT C.Ferdinand Lango. 3. Get regular exercise. 4. Schedule aptitude testing in the near future. 5  Make list of topics to discuss with C. Ferdinand Lango upon his return from paternity leave. 6. Look further into volunteering to get out of the house. 7. Continue all medications as written.

## 2014-04-26 NOTE — Progress Notes (Signed)
   [facility] Follow-up Outpatient Visit  Stephen Myers [birth year]  Date: 04/26/2014  Subjective:   Met with Stephen Myers today to discuss his progress since his last visit. He states He is better, but the noon dose of the Lithium is making him groggy. He asks if he can take the noon dose at hs to avoid this.  He is getting out more, he is planning to speak to a pastor about doing some volunteer work for [employer]. He states the SI has 30% which he feels is pretty good.  He has no plans and no intent to harm himself.    He also has questions about disability today as he has received a letter from [place] requesting records.    He reports no panic attacks in the interim since the last visit. He reports no OCD behaviors. He has been able to get out of the house and go to some different places since his last visit. He went with his brother to the mountains yesterday.    Currently he rates his depression as a 4/10, and his anxiety as a 6/10.  Filed Vitals:   04/26/14 0907  BP: 145/89  Pulse: 84    Mental Status Examination  Appearance: WDWNWM NAD Alert: Yes Attention: poor Cooperative: Yes Eye Contact: Good Speech: clear and goal directed, no stuttering. Psychomotor Activity: Normal Memory/Concentration: fair Oriented: person, place and day of week Mood: Anxious and Depressed Affect: Congruent fairly bright affect today Thought Processes and Associations: Coherent, Goal Directed, Linear and Logical Fund of Knowledge: Good Thought Content: Suicidal ideation decreased by 30% Insight: Fair Judgement: Good  Diagnosis: PTSD, OCD, GAD, MDD  Zung Depression scale: 48 Treatment Plan:  1. Get Lithium level today. 2. Continue with OPT C.Ferdinand Lango. 3. Get regular exercise. 4. Schedule aptitude testing in the near future. 5  Make list of topics to discuss with C. Ferdinand Lango upon his return from paternity leave. 6. Look further into volunteering to get out of the house. 7. Continue all  medications as written.  Marlane Hatcher. Mashburn RPAC 9:48 AM 04/26/2014

## 2014-04-27 LAB — LITHIUM LEVEL: LITHIUM LVL: 0.5 meq/L — AB (ref 0.80–1.40)

## 2014-05-01 ENCOUNTER — Telehealth (HOSPITAL_COMMUNITY): Payer: Self-pay

## 2014-05-02 ENCOUNTER — Ambulatory Visit (HOSPITAL_COMMUNITY): Payer: 59 | Admitting: Physician Assistant

## 2014-05-04 NOTE — Telephone Encounter (Signed)
Paperwork in process.

## 2014-05-26 ENCOUNTER — Ambulatory Visit (HOSPITAL_COMMUNITY): Payer: Self-pay | Admitting: Physician Assistant

## 2014-10-04 DIAGNOSIS — F4001 Agoraphobia with panic disorder: Secondary | ICD-10-CM | POA: Diagnosis not present

## 2014-10-04 DIAGNOSIS — F3181 Bipolar II disorder: Secondary | ICD-10-CM | POA: Diagnosis not present

## 2014-10-04 DIAGNOSIS — F4310 Post-traumatic stress disorder, unspecified: Secondary | ICD-10-CM | POA: Diagnosis not present

## 2014-11-01 DIAGNOSIS — F4001 Agoraphobia with panic disorder: Secondary | ICD-10-CM | POA: Diagnosis not present

## 2014-11-01 DIAGNOSIS — F3181 Bipolar II disorder: Secondary | ICD-10-CM | POA: Diagnosis not present

## 2014-11-01 DIAGNOSIS — F4310 Post-traumatic stress disorder, unspecified: Secondary | ICD-10-CM | POA: Diagnosis not present

## 2015-01-24 ENCOUNTER — Other Ambulatory Visit: Payer: Self-pay | Admitting: Sports Medicine

## 2015-02-01 ENCOUNTER — Encounter: Payer: Self-pay | Admitting: Sports Medicine

## 2015-02-01 ENCOUNTER — Ambulatory Visit (INDEPENDENT_AMBULATORY_CARE_PROVIDER_SITE_OTHER): Payer: BLUE CROSS/BLUE SHIELD | Admitting: Sports Medicine

## 2015-02-01 VITALS — BP 145/69 | HR 88 | Ht 76.0 in | Wt 290.0 lb

## 2015-02-01 DIAGNOSIS — E785 Hyperlipidemia, unspecified: Secondary | ICD-10-CM

## 2015-02-01 DIAGNOSIS — Z5181 Encounter for therapeutic drug level monitoring: Secondary | ICD-10-CM | POA: Diagnosis not present

## 2015-02-01 DIAGNOSIS — Z0000 Encounter for general adult medical examination without abnormal findings: Secondary | ICD-10-CM | POA: Diagnosis not present

## 2015-02-01 DIAGNOSIS — E559 Vitamin D deficiency, unspecified: Secondary | ICD-10-CM

## 2015-02-01 DIAGNOSIS — I10 Essential (primary) hypertension: Secondary | ICD-10-CM

## 2015-02-01 DIAGNOSIS — E119 Type 2 diabetes mellitus without complications: Secondary | ICD-10-CM

## 2015-02-01 DIAGNOSIS — Z131 Encounter for screening for diabetes mellitus: Secondary | ICD-10-CM

## 2015-02-01 MED ORDER — ATORVASTATIN CALCIUM 40 MG PO TABS: 40.0000 mg | ORAL_TABLET | Freq: Every day | ORAL | Status: DC

## 2015-02-01 MED ORDER — ASPIRIN 81 MG PO TABS: 81.0000 mg | ORAL_TABLET | Freq: Every day | ORAL | Status: AC

## 2015-02-01 MED ORDER — LOSARTAN POTASSIUM-HCTZ 50-12.5 MG PO TABS: 1.0000 | ORAL_TABLET | Freq: Every day | ORAL | Status: DC

## 2015-02-01 MED ORDER — ASPIRIN 81 MG PO TABS: 81.0000 mg | ORAL_TABLET | Freq: Every day | ORAL | Status: DC

## 2015-02-01 NOTE — Assessment & Plan Note (Signed)
Up-to-date on vaccinations, referral for colonoscopy.

## 2015-02-01 NOTE — Assessment & Plan Note (Signed)
Refilling medication, checking blood work.

## 2015-02-01 NOTE — Progress Notes (Signed)
  Subjective:    CC: Follow-up  HPI: Hyperlipidemia: Needs a refill, we have not checked lipids and some time.  Hypertension: Out of medication, needs a refill.  Mood disorders: Continues with psychiatry, somewhat depressed today but no suicidal or homicidal ideation, his psychiatrist is going to check a lithium level, I'm happy to check this and forward to Dr. Josephine Igo.  Past medical history, Surgical history, Family history not pertinant except as noted below, Social history, Allergies, and medications have been entered into the medical record, reviewed, and no changes needed.   Review of Systems: No fevers, chills, night sweats, weight loss, chest pain, or shortness of breath.   Objective:    General: Well Developed, well nourished, and in no acute distress.  Neuro: Alert and oriented x3, extra-ocular muscles intact, sensation grossly intact.  HEENT: Normocephalic, atraumatic, pupils equal round reactive to light, neck supple, no masses, no lymphadenopathy, thyroid nonpalpable.  Skin: Warm and dry, no rashes. Cardiac: Regular rate and rhythm, no murmurs rubs or gallops, no lower extremity edema.  Respiratory: Clear to auscultation bilaterally. Not using accessory muscles, speaking in full sentences.  Impression and Recommendations:

## 2015-02-01 NOTE — Assessment & Plan Note (Signed)
Running out of medication. Sending a refill.

## 2015-02-02 DIAGNOSIS — Z5181 Encounter for therapeutic drug level monitoring: Secondary | ICD-10-CM | POA: Diagnosis not present

## 2015-02-02 DIAGNOSIS — E785 Hyperlipidemia, unspecified: Secondary | ICD-10-CM | POA: Diagnosis not present

## 2015-02-02 DIAGNOSIS — E119 Type 2 diabetes mellitus without complications: Secondary | ICD-10-CM | POA: Diagnosis not present

## 2015-02-02 LAB — CBC
HCT: 42.5 % (ref 39.0–52.0)
Hemoglobin: 14.8 g/dL (ref 13.0–17.0)
MCH: 31.8 pg (ref 26.0–34.0)
MCHC: 34.8 g/dL (ref 30.0–36.0)
MCV: 91.4 fL (ref 78.0–100.0)
MPV: 10.9 fL (ref 8.6–12.4)
Platelets: 236 10*3/uL (ref 150–400)
RBC: 4.65 MIL/uL (ref 4.22–5.81)
RDW: 13.3 % (ref 11.5–15.5)
WBC: 7.8 10*3/uL (ref 4.0–10.5)

## 2015-02-02 LAB — COMPREHENSIVE METABOLIC PANEL WITH GFR
AST: 21 U/L (ref 0–37)
Albumin: 4.6 g/dL (ref 3.5–5.2)
BUN: 17 mg/dL (ref 6–23)
Calcium: 9.5 mg/dL (ref 8.4–10.5)
Chloride: 103 meq/L (ref 96–112)
Potassium: 4.1 meq/L (ref 3.5–5.3)
Total Protein: 7.1 g/dL (ref 6.0–8.3)

## 2015-02-02 LAB — COMPREHENSIVE METABOLIC PANEL
ALT: 25 U/L (ref 0–53)
Alkaline Phosphatase: 64 U/L (ref 39–117)
CO2: 24 mEq/L (ref 19–32)
Creat: 1.05 mg/dL (ref 0.50–1.35)
Glucose, Bld: 101 mg/dL — ABNORMAL HIGH (ref 70–99)
Sodium: 139 mEq/L (ref 135–145)
Total Bilirubin: 0.9 mg/dL (ref 0.2–1.2)

## 2015-02-02 LAB — LIPID PANEL
Cholesterol: 109 mg/dL (ref 0–200)
HDL: 31 mg/dL — ABNORMAL LOW (ref >=40)
LDL Cholesterol: 53 mg/dL (ref 0–99)
Total CHOL/HDL Ratio: 3.5 Ratio
Triglycerides: 123 mg/dL (ref <150)
VLDL: 25 mg/dL (ref 0–40)

## 2015-02-03 LAB — TSH: TSH: 1.454 u[IU]/mL (ref 0.350–4.500)

## 2015-02-03 LAB — LITHIUM LEVEL: Lithium Lvl: 0.6 mEq/L — ABNORMAL LOW (ref 0.80–1.40)

## 2015-02-03 LAB — HEMOGLOBIN A1C
Hgb A1c MFr Bld: 5.2 % (ref <5.7)
Mean Plasma Glucose: 103 mg/dL (ref <117)

## 2015-03-01 DIAGNOSIS — Z1211 Encounter for screening for malignant neoplasm of colon: Secondary | ICD-10-CM | POA: Diagnosis not present

## 2015-03-06 ENCOUNTER — Ambulatory Visit (INDEPENDENT_AMBULATORY_CARE_PROVIDER_SITE_OTHER): Payer: Medicare Other | Admitting: Sports Medicine

## 2015-03-06 ENCOUNTER — Encounter: Payer: Self-pay | Admitting: Sports Medicine

## 2015-03-06 VITALS — BP 135/72 | HR 69 | Ht 76.0 in | Wt 283.0 lb

## 2015-03-06 DIAGNOSIS — R1310 Dysphagia, unspecified: Secondary | ICD-10-CM | POA: Diagnosis not present

## 2015-03-06 NOTE — Assessment & Plan Note (Signed)
Occurred approximately the same time is starting benztropine for akathisia secondary to antipsychotic treatment. Discontinue benztropine for now. We will see how things go. He will call me back to let me know if dysphagia has improved. I would also like him consult with speech and swallow therapy and likely have a barium swallow evaluation.

## 2015-03-06 NOTE — Progress Notes (Signed)
  Subjective:    CC: difficulty swallowing  HPI: For the past month this pleasant 54 year old male has noted difficult swallowing that is overall improving. This corresponds to the time when he was started on benztropine for akathisia on his current antipsychotics.  Past medical history, Surgical history, Family history not pertinant except as noted below, Social history, Allergies, and medications have been entered into the medical record, reviewed, and no changes needed.   Review of Systems: No fevers, chills, night sweats, weight loss, chest pain, or shortness of breath.   Objective:    General: Well Developed, well nourished, and in no acute distress.  Neuro: Alert and oriented x3, extra-ocular muscles intact, sensation grossly intact.  HEENT: Normocephalic, atraumatic, pupils equal round reactive to light, neck supple, no masses, no lymphadenopathy, thyroid nonpalpable. Oropharynx, nasopharynx, ear canals are unremarkable, neck is supple without any masses. Skin: Warm and dry, no rashes. Cardiac: Regular rate and rhythm, no murmurs rubs or gallops, no lower extremity edema.  Respiratory: Clear to auscultation bilaterally. Not using accessory muscles, speaking in full sentences.  Impression and Recommendations:

## 2015-03-08 ENCOUNTER — Other Ambulatory Visit (HOSPITAL_COMMUNITY): Payer: Self-pay | Admitting: Sports Medicine

## 2015-03-08 DIAGNOSIS — R1314 Dysphagia, pharyngoesophageal phase: Secondary | ICD-10-CM

## 2015-03-09 DIAGNOSIS — F4001 Agoraphobia with panic disorder: Secondary | ICD-10-CM | POA: Diagnosis not present

## 2015-03-09 DIAGNOSIS — F3181 Bipolar II disorder: Secondary | ICD-10-CM | POA: Diagnosis not present

## 2015-03-09 DIAGNOSIS — F4310 Post-traumatic stress disorder, unspecified: Secondary | ICD-10-CM | POA: Diagnosis not present

## 2015-03-12 ENCOUNTER — Telehealth: Payer: Self-pay | Admitting: Sports Medicine

## 2015-03-12 NOTE — Telephone Encounter (Signed)
No, cancel all of the swallow study. My suspicion was that it was simply an iatrogenic effect from the benztropine itself. Thank you however for setting it up. LOL.

## 2015-03-12 NOTE — Telephone Encounter (Signed)
Patients wife Bethena Roys) called to state since Jessica was taken off the benztropine he has not had any issues with swallowing. Inquiring if the swallow study is still needed. Advised I would let Dr. Darene Lamer know and until they hear differently to go ahead with the study which is scheduled for Wednesday (03/14/15). Verbalized understanding, no further questions.

## 2015-03-13 NOTE — Telephone Encounter (Signed)
Spoke with Patient, advised we are going to cancel the swallowing study. Informed him to contact us with any questions or concerns, verbalized understanding.  Called and spoke with Mariann Laster with Radiology at [facility], cancelled study.

## 2015-03-14 ENCOUNTER — Ambulatory Visit (HOSPITAL_COMMUNITY)
Admission: RE | Admit: 2015-03-14 | Payer: Medicare Other | Source: Ambulatory Visit | Attending: Family Medicine | Admitting: Family Medicine

## 2015-03-14 ENCOUNTER — Ambulatory Visit (HOSPITAL_COMMUNITY): Payer: Medicare Other

## 2015-03-14 ENCOUNTER — Other Ambulatory Visit (HOSPITAL_COMMUNITY): Payer: Self-pay

## 2015-04-03 ENCOUNTER — Ambulatory Visit: Payer: Self-pay | Admitting: Sports Medicine

## 2015-04-20 ENCOUNTER — Other Ambulatory Visit: Payer: Self-pay | Admitting: Sports Medicine

## 2015-04-20 NOTE — Telephone Encounter (Signed)
Attempted to contact Pt to see which pharmacy he would prefer his Rx be sent too.

## 2015-04-23 DIAGNOSIS — F4310 Post-traumatic stress disorder, unspecified: Secondary | ICD-10-CM | POA: Diagnosis not present

## 2015-04-24 DIAGNOSIS — F4310 Post-traumatic stress disorder, unspecified: Secondary | ICD-10-CM | POA: Diagnosis not present

## 2015-04-24 DIAGNOSIS — F4001 Agoraphobia with panic disorder: Secondary | ICD-10-CM | POA: Diagnosis not present

## 2015-04-24 DIAGNOSIS — F3181 Bipolar II disorder: Secondary | ICD-10-CM | POA: Diagnosis not present

## 2015-04-30 ENCOUNTER — Other Ambulatory Visit: Payer: Self-pay | Admitting: Sports Medicine

## 2015-04-30 DIAGNOSIS — E785 Hyperlipidemia, unspecified: Secondary | ICD-10-CM

## 2015-04-30 MED ORDER — ATORVASTATIN CALCIUM 40 MG PO TABS: 40.0000 mg | ORAL_TABLET | Freq: Every day | ORAL | Status: DC

## 2015-04-30 NOTE — Telephone Encounter (Signed)
Rx requested to be sent to mail order pharmacy.

## 2015-05-10 DIAGNOSIS — F4310 Post-traumatic stress disorder, unspecified: Secondary | ICD-10-CM | POA: Diagnosis not present

## 2015-06-11 DIAGNOSIS — F4310 Post-traumatic stress disorder, unspecified: Secondary | ICD-10-CM | POA: Diagnosis not present

## 2015-06-13 DIAGNOSIS — F4310 Post-traumatic stress disorder, unspecified: Secondary | ICD-10-CM | POA: Diagnosis not present

## 2015-06-13 DIAGNOSIS — F42 Obsessive-compulsive disorder: Secondary | ICD-10-CM | POA: Diagnosis not present

## 2015-06-13 DIAGNOSIS — F4001 Agoraphobia with panic disorder: Secondary | ICD-10-CM | POA: Diagnosis not present

## 2015-06-13 DIAGNOSIS — F3181 Bipolar II disorder: Secondary | ICD-10-CM | POA: Diagnosis not present

## 2015-06-19 DIAGNOSIS — F3181 Bipolar II disorder: Secondary | ICD-10-CM | POA: Diagnosis not present

## 2015-06-19 DIAGNOSIS — F4001 Agoraphobia with panic disorder: Secondary | ICD-10-CM | POA: Diagnosis not present

## 2015-06-19 DIAGNOSIS — F4310 Post-traumatic stress disorder, unspecified: Secondary | ICD-10-CM | POA: Diagnosis not present

## 2015-06-19 DIAGNOSIS — F42 Obsessive-compulsive disorder: Secondary | ICD-10-CM | POA: Diagnosis not present

## 2015-07-16 DIAGNOSIS — F4310 Post-traumatic stress disorder, unspecified: Secondary | ICD-10-CM | POA: Diagnosis not present

## 2015-07-17 ENCOUNTER — Other Ambulatory Visit: Payer: Self-pay | Admitting: Sports Medicine

## 2015-08-03 ENCOUNTER — Ambulatory Visit (INDEPENDENT_AMBULATORY_CARE_PROVIDER_SITE_OTHER): Payer: Commercial Managed Care - HMO | Admitting: Sports Medicine

## 2015-08-03 ENCOUNTER — Ambulatory Visit: Payer: Self-pay | Admitting: Sports Medicine

## 2015-08-03 VITALS — BP 128/65 | HR 105 | Wt 254.0 lb

## 2015-08-03 DIAGNOSIS — R1310 Dysphagia, unspecified: Secondary | ICD-10-CM

## 2015-08-03 NOTE — Assessment & Plan Note (Signed)
Improved significantly with discontinuation of benztropine, unfortunately akathisia did return. He was then switched to trihexyphenidyl which improved the akathisia as well however he had the same dysphagia. Currently he is on amantadine with no return of dysphagia, and moderate control of akathisia. Return to see me in 6 months.

## 2015-08-03 NOTE — Progress Notes (Signed)
  Subjective:    CC:  Follow-up  HPI: Dysphagia:  Resolved with discontinuation of benztropine, for his akathisia, he was started on  Trihexyphenidyl by his psychiatrist which unfortunately also caused significant dysphagia although it did control his akathisia.   More recently he was switched to amantadine which has done a moderate job of controlling his akathisia however he is no longer having any dysphagia, happy with how things are going so far.  Past medical history, Surgical history, Family history not pertinant except as noted below, Social history, Allergies, and medications have been entered into the medical record, reviewed, and no changes needed.   Review of Systems: No fevers, chills, night sweats, weight loss, chest pain, or shortness of breath.   Objective:    General: Well Developed, well nourished, and in no acute distress.  Neuro: Alert and oriented x3, extra-ocular muscles intact, sensation grossly intact.  HEENT: Normocephalic, atraumatic, pupils equal round reactive to light, neck supple, no masses, no lymphadenopathy, thyroid nonpalpable.  Skin: Warm and dry, no rashes. Cardiac: Regular rate and rhythm, no murmurs rubs or gallops, no lower extremity edema.  Respiratory: Clear to auscultation bilaterally. Not using accessory muscles, speaking in full sentences.  Impression and Recommendations:

## 2015-08-11 ENCOUNTER — Other Ambulatory Visit: Payer: Self-pay | Admitting: Sports Medicine

## 2015-08-13 DIAGNOSIS — F4310 Post-traumatic stress disorder, unspecified: Secondary | ICD-10-CM | POA: Diagnosis not present

## 2015-08-23 ENCOUNTER — Other Ambulatory Visit: Payer: Self-pay | Admitting: Sports Medicine

## 2015-08-23 DIAGNOSIS — F429 Obsessive-compulsive disorder, unspecified: Secondary | ICD-10-CM | POA: Diagnosis not present

## 2015-08-23 DIAGNOSIS — R29818 Other symptoms and signs involving the nervous system: Secondary | ICD-10-CM | POA: Diagnosis not present

## 2015-08-23 DIAGNOSIS — F4310 Post-traumatic stress disorder, unspecified: Secondary | ICD-10-CM | POA: Diagnosis not present

## 2015-08-23 DIAGNOSIS — F4001 Agoraphobia with panic disorder: Secondary | ICD-10-CM | POA: Diagnosis not present

## 2015-08-23 DIAGNOSIS — F3181 Bipolar II disorder: Secondary | ICD-10-CM | POA: Diagnosis not present

## 2015-09-06 ENCOUNTER — Telehealth: Payer: Self-pay

## 2015-09-06 DIAGNOSIS — F411 Generalized anxiety disorder: Secondary | ICD-10-CM

## 2015-09-06 NOTE — Telephone Encounter (Signed)
Patient request a referral for Behavioral health with  Tressa Busman @ [facility]. Rhonda Cunningham,CMA

## 2015-09-20 DIAGNOSIS — F3181 Bipolar II disorder: Secondary | ICD-10-CM | POA: Diagnosis not present

## 2015-09-20 DIAGNOSIS — R29818 Other symptoms and signs involving the nervous system: Secondary | ICD-10-CM | POA: Diagnosis not present

## 2015-09-20 DIAGNOSIS — F4001 Agoraphobia with panic disorder: Secondary | ICD-10-CM | POA: Diagnosis not present

## 2015-09-20 DIAGNOSIS — F419 Anxiety disorder, unspecified: Secondary | ICD-10-CM | POA: Diagnosis not present

## 2015-09-20 DIAGNOSIS — F429 Obsessive-compulsive disorder, unspecified: Secondary | ICD-10-CM | POA: Diagnosis not present

## 2015-09-20 DIAGNOSIS — Z79899 Other long term (current) drug therapy: Secondary | ICD-10-CM | POA: Diagnosis not present

## 2015-09-20 DIAGNOSIS — F4310 Post-traumatic stress disorder, unspecified: Secondary | ICD-10-CM | POA: Diagnosis not present

## 2015-09-25 DIAGNOSIS — Z79899 Other long term (current) drug therapy: Secondary | ICD-10-CM | POA: Diagnosis not present

## 2015-09-25 DIAGNOSIS — R29818 Other symptoms and signs involving the nervous system: Secondary | ICD-10-CM | POA: Diagnosis not present

## 2015-09-25 DIAGNOSIS — F4001 Agoraphobia with panic disorder: Secondary | ICD-10-CM | POA: Diagnosis not present

## 2015-09-25 DIAGNOSIS — F429 Obsessive-compulsive disorder, unspecified: Secondary | ICD-10-CM | POA: Diagnosis not present

## 2015-09-25 DIAGNOSIS — F3181 Bipolar II disorder: Secondary | ICD-10-CM | POA: Diagnosis not present

## 2015-09-25 DIAGNOSIS — F4310 Post-traumatic stress disorder, unspecified: Secondary | ICD-10-CM | POA: Diagnosis not present

## 2015-10-18 DIAGNOSIS — R29818 Other symptoms and signs involving the nervous system: Secondary | ICD-10-CM | POA: Diagnosis not present

## 2015-10-18 DIAGNOSIS — F4001 Agoraphobia with panic disorder: Secondary | ICD-10-CM | POA: Diagnosis not present

## 2015-10-18 DIAGNOSIS — F3181 Bipolar II disorder: Secondary | ICD-10-CM | POA: Diagnosis not present

## 2015-10-18 DIAGNOSIS — F4310 Post-traumatic stress disorder, unspecified: Secondary | ICD-10-CM | POA: Diagnosis not present

## 2015-10-18 DIAGNOSIS — F429 Obsessive-compulsive disorder, unspecified: Secondary | ICD-10-CM | POA: Diagnosis not present

## 2015-10-20 ENCOUNTER — Other Ambulatory Visit: Payer: Self-pay | Admitting: Sports Medicine

## 2015-11-22 DIAGNOSIS — F429 Obsessive-compulsive disorder, unspecified: Secondary | ICD-10-CM | POA: Diagnosis not present

## 2015-11-22 DIAGNOSIS — R29818 Other symptoms and signs involving the nervous system: Secondary | ICD-10-CM | POA: Diagnosis not present

## 2015-11-22 DIAGNOSIS — F4001 Agoraphobia with panic disorder: Secondary | ICD-10-CM | POA: Diagnosis not present

## 2015-11-22 DIAGNOSIS — F3181 Bipolar II disorder: Secondary | ICD-10-CM | POA: Diagnosis not present

## 2015-11-22 DIAGNOSIS — F4310 Post-traumatic stress disorder, unspecified: Secondary | ICD-10-CM | POA: Diagnosis not present

## 2015-12-07 DIAGNOSIS — F4310 Post-traumatic stress disorder, unspecified: Secondary | ICD-10-CM | POA: Diagnosis not present

## 2015-12-19 DIAGNOSIS — F4310 Post-traumatic stress disorder, unspecified: Secondary | ICD-10-CM | POA: Diagnosis not present

## 2015-12-19 DIAGNOSIS — R29818 Other symptoms and signs involving the nervous system: Secondary | ICD-10-CM | POA: Diagnosis not present

## 2015-12-19 DIAGNOSIS — F429 Obsessive-compulsive disorder, unspecified: Secondary | ICD-10-CM | POA: Diagnosis not present

## 2015-12-19 DIAGNOSIS — F3181 Bipolar II disorder: Secondary | ICD-10-CM | POA: Diagnosis not present

## 2015-12-19 DIAGNOSIS — F4001 Agoraphobia with panic disorder: Secondary | ICD-10-CM | POA: Diagnosis not present

## 2015-12-20 ENCOUNTER — Other Ambulatory Visit: Payer: Self-pay | Admitting: Sports Medicine

## 2016-01-04 ENCOUNTER — Other Ambulatory Visit: Payer: Self-pay | Admitting: Sports Medicine

## 2016-01-04 DIAGNOSIS — F429 Obsessive-compulsive disorder, unspecified: Secondary | ICD-10-CM

## 2016-01-04 DIAGNOSIS — F4310 Post-traumatic stress disorder, unspecified: Secondary | ICD-10-CM | POA: Diagnosis not present

## 2016-01-17 DIAGNOSIS — F4310 Post-traumatic stress disorder, unspecified: Secondary | ICD-10-CM | POA: Diagnosis not present

## 2016-01-17 DIAGNOSIS — F3181 Bipolar II disorder: Secondary | ICD-10-CM | POA: Diagnosis not present

## 2016-01-17 DIAGNOSIS — R29818 Other symptoms and signs involving the nervous system: Secondary | ICD-10-CM | POA: Diagnosis not present

## 2016-01-17 DIAGNOSIS — F429 Obsessive-compulsive disorder, unspecified: Secondary | ICD-10-CM | POA: Diagnosis not present

## 2016-01-17 DIAGNOSIS — F4001 Agoraphobia with panic disorder: Secondary | ICD-10-CM | POA: Diagnosis not present

## 2016-01-23 ENCOUNTER — Other Ambulatory Visit: Payer: Self-pay | Admitting: Sports Medicine

## 2016-01-31 ENCOUNTER — Ambulatory Visit (INDEPENDENT_AMBULATORY_CARE_PROVIDER_SITE_OTHER): Payer: BLUE CROSS/BLUE SHIELD | Admitting: Sports Medicine

## 2016-01-31 ENCOUNTER — Encounter: Payer: Self-pay | Admitting: Sports Medicine

## 2016-01-31 DIAGNOSIS — I10 Essential (primary) hypertension: Secondary | ICD-10-CM | POA: Diagnosis not present

## 2016-01-31 DIAGNOSIS — F8081 Childhood onset fluency disorder: Secondary | ICD-10-CM | POA: Diagnosis not present

## 2016-01-31 DIAGNOSIS — Z131 Encounter for screening for diabetes mellitus: Secondary | ICD-10-CM | POA: Diagnosis not present

## 2016-01-31 MED ORDER — ROPINIROLE HCL 2 MG PO TABS: 2.0000 mg | ORAL_TABLET | Freq: Every day | ORAL | Status: DC

## 2016-01-31 NOTE — Progress Notes (Signed)
  Subjective:    CC:  Follow-up  HPI:  mood disorder: Well-controlled   Dysphagia: Resolved   Stuttering: Persistent  Past medical history, Surgical history, Family history not pertinant except as noted below, Social history, Allergies, and medications have been entered into the medical record, reviewed, and no changes needed.   Review of Systems: No fevers, chills, night sweats, weight loss, chest pain, or shortness of breath.   Objective:    General: Well Developed, well nourished, and in no acute distress.  Neuro: Alert and oriented x3, extra-ocular muscles intact, sensation grossly intact.  HEENT: Normocephalic, atraumatic, pupils equal round reactive to light, neck supple, no masses, no lymphadenopathy, thyroid nonpalpable.  Skin: Warm and dry, no rashes. Cardiac: Regular rate and rhythm, no murmurs rubs or gallops, no lower extremity edema.  Respiratory: Clear to auscultation bilaterally. Not using accessory muscles, speaking in full sentences.  Impression and Recommendations:

## 2016-01-31 NOTE — Assessment & Plan Note (Signed)
Seems to be worsening on current medications, I am going to increase his ropinirole to 2 mg at bedtime. Even though this is for other symptoms, it may in fact help his stuttering a bit.

## 2016-02-01 LAB — COMPREHENSIVE METABOLIC PANEL WITH GFR
ALT: 22 U/L (ref 9–46)
AST: 21 U/L (ref 10–35)
Albumin: 4.5 g/dL (ref 3.6–5.1)
Alkaline Phosphatase: 63 U/L (ref 40–115)
Calcium: 9.4 mg/dL (ref 8.6–10.3)
Creat: 1.03 mg/dL (ref 0.70–1.33)
Total Bilirubin: 0.7 mg/dL (ref 0.2–1.2)

## 2016-02-01 LAB — COMPREHENSIVE METABOLIC PANEL
BUN: 15 mg/dL (ref 7–25)
CO2: 28 mmol/L (ref 20–31)
Chloride: 104 mmol/L (ref 98–110)
Glucose, Bld: 102 mg/dL — ABNORMAL HIGH (ref 65–99)
Potassium: 4.2 mmol/L (ref 3.5–5.3)
Sodium: 141 mmol/L (ref 135–146)
Total Protein: 6.8 g/dL (ref 6.1–8.1)

## 2016-02-01 LAB — LIPID PANEL
Cholesterol: 107 mg/dL — ABNORMAL LOW (ref 125–200)
HDL: 46 mg/dL (ref >=40)
LDL Cholesterol: 48 mg/dL (ref <130)
Total CHOL/HDL Ratio: 2.3 Ratio (ref <=5.0)
Triglycerides: 66 mg/dL (ref <150)
VLDL: 13 mg/dL (ref <30)

## 2016-02-01 LAB — CBC
HCT: 41.3 % (ref 39.0–52.0)
Hemoglobin: 13.9 g/dL (ref 13.0–17.0)
MCH: 31.2 pg (ref 26.0–34.0)
MCHC: 33.7 g/dL (ref 30.0–36.0)
MCV: 92.8 fL (ref 78.0–100.0)
MPV: 10.3 fL (ref 8.6–12.4)
Platelets: 204 10*3/uL (ref 150–400)
RBC: 4.45 MIL/uL (ref 4.22–5.81)
RDW: 13.1 % (ref 11.5–15.5)
WBC: 6 K/uL (ref 4.0–10.5)

## 2016-02-01 LAB — TSH: TSH: 1.41 m[IU]/L (ref 0.40–4.50)

## 2016-02-01 LAB — HEMOGLOBIN A1C
Hgb A1c MFr Bld: 5 % (ref <5.7)
Mean Plasma Glucose: 97 mg/dL

## 2016-02-08 DIAGNOSIS — F4310 Post-traumatic stress disorder, unspecified: Secondary | ICD-10-CM | POA: Diagnosis not present

## 2016-02-08 DIAGNOSIS — F4001 Agoraphobia with panic disorder: Secondary | ICD-10-CM | POA: Diagnosis not present

## 2016-02-14 DIAGNOSIS — F429 Obsessive-compulsive disorder, unspecified: Secondary | ICD-10-CM | POA: Diagnosis not present

## 2016-02-14 DIAGNOSIS — F4310 Post-traumatic stress disorder, unspecified: Secondary | ICD-10-CM | POA: Diagnosis not present

## 2016-02-14 DIAGNOSIS — F4001 Agoraphobia with panic disorder: Secondary | ICD-10-CM | POA: Diagnosis not present

## 2016-02-14 DIAGNOSIS — F3181 Bipolar II disorder: Secondary | ICD-10-CM | POA: Diagnosis not present

## 2016-02-14 DIAGNOSIS — R29818 Other symptoms and signs involving the nervous system: Secondary | ICD-10-CM | POA: Diagnosis not present

## 2016-02-18 ENCOUNTER — Other Ambulatory Visit: Payer: Self-pay | Admitting: Sports Medicine

## 2016-03-14 DIAGNOSIS — F4310 Post-traumatic stress disorder, unspecified: Secondary | ICD-10-CM | POA: Diagnosis not present

## 2016-03-14 DIAGNOSIS — F319 Bipolar disorder, unspecified: Secondary | ICD-10-CM | POA: Diagnosis not present

## 2016-03-27 DIAGNOSIS — F4001 Agoraphobia with panic disorder: Secondary | ICD-10-CM | POA: Diagnosis not present

## 2016-03-27 DIAGNOSIS — F3181 Bipolar II disorder: Secondary | ICD-10-CM | POA: Diagnosis not present

## 2016-03-27 DIAGNOSIS — F429 Obsessive-compulsive disorder, unspecified: Secondary | ICD-10-CM | POA: Diagnosis not present

## 2016-03-27 DIAGNOSIS — F4310 Post-traumatic stress disorder, unspecified: Secondary | ICD-10-CM | POA: Diagnosis not present

## 2016-03-27 DIAGNOSIS — Z79899 Other long term (current) drug therapy: Secondary | ICD-10-CM | POA: Diagnosis not present

## 2016-03-27 DIAGNOSIS — R29818 Other symptoms and signs involving the nervous system: Secondary | ICD-10-CM | POA: Diagnosis not present

## 2016-04-08 ENCOUNTER — Ambulatory Visit (INDEPENDENT_AMBULATORY_CARE_PROVIDER_SITE_OTHER): Payer: Commercial Managed Care - HMO | Admitting: Sports Medicine

## 2016-04-08 ENCOUNTER — Encounter: Payer: Self-pay | Admitting: Sports Medicine

## 2016-04-08 VITALS — BP 133/78 | HR 78 | Resp 18 | Wt 254.7 lb

## 2016-04-08 DIAGNOSIS — M722 Plantar fascial fibromatosis: Secondary | ICD-10-CM | POA: Diagnosis not present

## 2016-04-08 NOTE — Progress Notes (Signed)
  Subjective:    CC: Left heel pain  HPI: For the past several weeks this pleasant 55 year old male has had pain that he localizes on the plantar aspect of his left heel, moderate, persistent. Desires injection today. Pain does not radiate.  Past medical history, Surgical history, Family history not pertinant except as noted below, Social history, Allergies, and medications have been entered into the medical record, reviewed, and no changes needed.   Review of Systems: No fevers, chills, night sweats, weight loss, chest pain, or shortness of breath.   Objective:    General: Well Developed, well nourished, and in no acute distress.  Neuro: Alert and oriented x3, extra-ocular muscles intact, sensation grossly intact.  HEENT: Normocephalic, atraumatic, pupils equal round reactive to light, neck supple, no masses, no lymphadenopathy, thyroid nonpalpable.  Skin: Warm and dry, no rashes. Cardiac: Regular rate and rhythm, no murmurs rubs or gallops, no lower extremity edema.  Respiratory: Clear to auscultation bilaterally. Not using accessory muscles, speaking in full sentences. Left Foot: No visible erythema or swelling. Range of motion is full in all directions. Strength is 5/5 in all directions. No hallux valgus. No pes cavus or pes planus. No abnormal callus noted. No pain over the navicular prominence, or base of fifth metatarsal. Tender to palpation of the calcaneal insertion of plantar fascia. No pain at the Achilles insertion. No pain over the calcaneal bursa. No pain of the retrocalcaneal bursa. No tenderness to palpation over the tarsals, metatarsals, or phalanges. No hallux rigidus or limitus. No tenderness palpation over interphalangeal joints. No pain with compression of the metatarsal heads. Neurovascularly intact distally.  Procedure: Real-time Ultrasound Guided Injection of left plantar fascial origin Device: GE Logiq E  Verbal informed consent obtained.  Time-out  conducted.  Noted no overlying erythema, induration, or other signs of local infection.  Skin prepped in a sterile fashion.  Local anesthesia: Topical Ethyl chloride.  With sterile technique and under real time ultrasound guidance:  1 mL kenalog 40, 1 mL lidocaine, 1 mL Marcaine injected easily. Completed without difficulty  Pain immediately resolved suggesting accurate placement of the medication.  Advised to call if fevers/chills, erythema, induration, drainage, or persistent bleeding.  Images permanently stored and available for review in the ultrasound unit.  Impression: Technically successful ultrasound guided injection.  Impression and Recommendations:

## 2016-04-08 NOTE — Assessment & Plan Note (Signed)
Injection as above, return for custom orthotics.

## 2016-04-08 NOTE — Addendum Note (Signed)
Addended by: Elizabeth Sauer on: 04/08/2016 03:36 PM   Modules accepted: Orders, Medications

## 2016-04-11 DIAGNOSIS — F4001 Agoraphobia with panic disorder: Secondary | ICD-10-CM | POA: Diagnosis not present

## 2016-04-11 DIAGNOSIS — F4310 Post-traumatic stress disorder, unspecified: Secondary | ICD-10-CM | POA: Diagnosis not present

## 2016-04-15 ENCOUNTER — Encounter: Payer: Self-pay | Admitting: Sports Medicine

## 2016-04-15 ENCOUNTER — Ambulatory Visit (INDEPENDENT_AMBULATORY_CARE_PROVIDER_SITE_OTHER): Payer: BLUE CROSS/BLUE SHIELD | Admitting: Sports Medicine

## 2016-04-15 VITALS — BP 134/72 | HR 93 | Resp 16 | Wt 254.0 lb

## 2016-04-15 DIAGNOSIS — M722 Plantar fascial fibromatosis: Secondary | ICD-10-CM | POA: Diagnosis not present

## 2016-04-15 NOTE — Assessment & Plan Note (Signed)
Good response to injection but still with some pain in the morning. Return in one month, custom orthotics as above.

## 2016-04-15 NOTE — Progress Notes (Signed)

## 2016-04-24 DIAGNOSIS — F4001 Agoraphobia with panic disorder: Secondary | ICD-10-CM | POA: Diagnosis not present

## 2016-04-24 DIAGNOSIS — F4310 Post-traumatic stress disorder, unspecified: Secondary | ICD-10-CM | POA: Diagnosis not present

## 2016-04-24 DIAGNOSIS — F429 Obsessive-compulsive disorder, unspecified: Secondary | ICD-10-CM | POA: Diagnosis not present

## 2016-04-24 DIAGNOSIS — F3181 Bipolar II disorder: Secondary | ICD-10-CM | POA: Diagnosis not present

## 2016-04-24 DIAGNOSIS — R29818 Other symptoms and signs involving the nervous system: Secondary | ICD-10-CM | POA: Diagnosis not present

## 2016-04-24 DIAGNOSIS — Z79899 Other long term (current) drug therapy: Secondary | ICD-10-CM | POA: Diagnosis not present

## 2016-04-27 ENCOUNTER — Other Ambulatory Visit: Payer: Self-pay | Admitting: Sports Medicine

## 2016-05-12 ENCOUNTER — Telehealth: Payer: Self-pay | Admitting: Sports Medicine

## 2016-05-12 DIAGNOSIS — F429 Obsessive-compulsive disorder, unspecified: Secondary | ICD-10-CM

## 2016-05-12 NOTE — Telephone Encounter (Signed)
Changed lab orders to STAT, Pt will get them completed tomorrow am. Pt has a prescheduled appt tomorrow, goal is to have blood work back prior to appt.  Pt reports he is taking (2) 450mg  (900mg  total) tablets of Lithium at night. Not taking one tab BID as Rx reads.

## 2016-05-12 NOTE — Telephone Encounter (Signed)
Adding lithium levels, blood work needs to be a trough checked prior to dose.

## 2016-05-12 NOTE — Telephone Encounter (Signed)
Left VM for Pt to return clinic call.  

## 2016-05-12 NOTE — Telephone Encounter (Signed)
Pharmacist from [facility] called stating Pt's lithium dose was recently increased to 450mg  BID. Pt also takes Hyzaar. Pt has reported since increasing the Lithium dose he has begun having some muscle twitching. Pt has been on this same increased dose of Lithium in the past with no side effect. Pharmacist questions if the Hyzaar mixed with Lithium could be causing this. Will route to PCP for review.

## 2016-05-13 ENCOUNTER — Ambulatory Visit (INDEPENDENT_AMBULATORY_CARE_PROVIDER_SITE_OTHER): Payer: BLUE CROSS/BLUE SHIELD | Admitting: Sports Medicine

## 2016-05-13 DIAGNOSIS — R450 Nervousness: Secondary | ICD-10-CM

## 2016-05-13 DIAGNOSIS — G2571 Drug induced akathisia: Secondary | ICD-10-CM | POA: Insufficient documentation

## 2016-05-13 DIAGNOSIS — M722 Plantar fascial fibromatosis: Secondary | ICD-10-CM

## 2016-05-13 LAB — LITHIUM LEVEL: Lithium Lvl: 0.6 mmol/L (ref 0.6–1.2)

## 2016-05-13 LAB — BASIC METABOLIC PANEL
BUN: 20 mg/dL (ref 7–25)
CO2: 27 mmol/L (ref 20–31)
Glucose, Bld: 86 mg/dL (ref 65–99)
Potassium: 4.1 mmol/L (ref 3.5–5.3)

## 2016-05-13 LAB — BASIC METABOLIC PANEL WITH GFR
Calcium: 9.6 mg/dL (ref 8.6–10.3)
Chloride: 104 mmol/L (ref 98–110)
Creat: 1.13 mg/dL (ref 0.70–1.33)
Sodium: 139 mmol/L (ref 135–146)

## 2016-05-13 NOTE — Assessment & Plan Note (Signed)
Essentially resolved, still with a bit of pain so I did add gel cups.

## 2016-05-13 NOTE — Progress Notes (Signed)
  Subjective:    CC:  Follow-up  HPI: Plantar fasciitis: Resolved with custom orthotics and injection, has only a bit of pain that's persistent.  Medication management: Currently on losartan and lithium, told by his pharmacist that these 2 medications were incompatible together, and that he twitches that he had were likely from high levels of lithium. We recently checked his lithium levels and they were normal, he has continued motor restlessness, we party diagnosed him with akathisia.  Past medical history, Surgical history, Family history not pertinant except as noted below, Social history, Allergies, and medications have been entered into the medical record, reviewed, and no changes needed.   Review of Systems: No fevers, chills, night sweats, weight loss, chest pain, or shortness of breath.   Objective:    General: Well Developed, well nourished, and in no acute distress.  Neuro: Alert and oriented x3, extra-ocular muscles intact, sensation grossly intact.  HEENT: Normocephalic, atraumatic, pupils equal round reactive to light, neck supple, no masses, no lymphadenopathy, thyroid nonpalpable.  Skin: Warm and dry, no rashes. Cardiac: Regular rate and rhythm, no murmurs rubs or gallops, no lower extremity edema.  Respiratory: Clear to auscultation bilaterally. Not using accessory muscles, speaking in full sentences.  Impression and Recommendations:    I spent 25 minutes with this patient, greater than 50% was face-to-face time counseling regarding the above diagnoses

## 2016-05-13 NOTE — Assessment & Plan Note (Signed)
He will discuss this further with his psychiatrist.

## 2016-06-09 ENCOUNTER — Other Ambulatory Visit: Payer: Self-pay | Admitting: *Deleted

## 2016-06-09 MED ORDER — LOSARTAN POTASSIUM-HCTZ 50-12.5 MG PO TABS: 1.0000 | ORAL_TABLET | Freq: Every day | ORAL | 1 refills | Status: DC

## 2016-06-11 ENCOUNTER — Other Ambulatory Visit: Payer: Self-pay | Admitting: Sports Medicine

## 2016-06-11 MED ORDER — ATORVASTATIN CALCIUM 40 MG PO TABS: 40.0000 mg | ORAL_TABLET | Freq: Every day | ORAL | 3 refills | Status: DC

## 2016-06-18 DIAGNOSIS — Z79899 Other long term (current) drug therapy: Secondary | ICD-10-CM | POA: Diagnosis not present

## 2016-06-18 DIAGNOSIS — E7800 Pure hypercholesterolemia, unspecified: Secondary | ICD-10-CM | POA: Diagnosis not present

## 2016-06-18 DIAGNOSIS — I10 Essential (primary) hypertension: Secondary | ICD-10-CM | POA: Diagnosis not present

## 2016-06-24 DIAGNOSIS — L579 Skin changes due to chronic exposure to nonionizing radiation, unspecified: Secondary | ICD-10-CM | POA: Diagnosis not present

## 2016-06-24 DIAGNOSIS — C44319 Basal cell carcinoma of skin of other parts of face: Secondary | ICD-10-CM | POA: Diagnosis not present

## 2016-06-24 DIAGNOSIS — L821 Other seborrheic keratosis: Secondary | ICD-10-CM | POA: Diagnosis not present

## 2016-06-24 DIAGNOSIS — L570 Actinic keratosis: Secondary | ICD-10-CM | POA: Diagnosis not present

## 2016-06-24 DIAGNOSIS — L814 Other melanin hyperpigmentation: Secondary | ICD-10-CM | POA: Diagnosis not present

## 2016-06-24 DIAGNOSIS — D492 Neoplasm of unspecified behavior of bone, soft tissue, and skin: Secondary | ICD-10-CM | POA: Diagnosis not present

## 2016-07-17 DIAGNOSIS — C44319 Basal cell carcinoma of skin of other parts of face: Secondary | ICD-10-CM | POA: Diagnosis not present

## 2016-07-28 DIAGNOSIS — C44112 Basal cell carcinoma of skin of right eyelid, including canthus: Secondary | ICD-10-CM | POA: Diagnosis not present

## 2016-11-27 DIAGNOSIS — I10 Essential (primary) hypertension: Secondary | ICD-10-CM | POA: Diagnosis not present

## 2016-11-27 DIAGNOSIS — F429 Obsessive-compulsive disorder, unspecified: Secondary | ICD-10-CM | POA: Diagnosis not present

## 2016-11-27 DIAGNOSIS — F4001 Agoraphobia with panic disorder: Secondary | ICD-10-CM | POA: Diagnosis not present

## 2016-11-27 DIAGNOSIS — R29818 Other symptoms and signs involving the nervous system: Secondary | ICD-10-CM | POA: Diagnosis not present

## 2016-11-27 DIAGNOSIS — F3181 Bipolar II disorder: Secondary | ICD-10-CM | POA: Diagnosis not present

## 2016-11-27 DIAGNOSIS — F4310 Post-traumatic stress disorder, unspecified: Secondary | ICD-10-CM | POA: Diagnosis not present

## 2016-12-18 DIAGNOSIS — F3181 Bipolar II disorder: Secondary | ICD-10-CM | POA: Diagnosis not present

## 2016-12-18 DIAGNOSIS — I10 Essential (primary) hypertension: Secondary | ICD-10-CM | POA: Diagnosis not present

## 2016-12-18 DIAGNOSIS — F4310 Post-traumatic stress disorder, unspecified: Secondary | ICD-10-CM | POA: Diagnosis not present

## 2016-12-18 DIAGNOSIS — F4001 Agoraphobia with panic disorder: Secondary | ICD-10-CM | POA: Diagnosis not present

## 2016-12-18 DIAGNOSIS — Z79899 Other long term (current) drug therapy: Secondary | ICD-10-CM | POA: Diagnosis not present

## 2016-12-23 DIAGNOSIS — D492 Neoplasm of unspecified behavior of bone, soft tissue, and skin: Secondary | ICD-10-CM | POA: Diagnosis not present

## 2016-12-23 DIAGNOSIS — Z85828 Personal history of other malignant neoplasm of skin: Secondary | ICD-10-CM | POA: Diagnosis not present

## 2016-12-23 DIAGNOSIS — L814 Other melanin hyperpigmentation: Secondary | ICD-10-CM | POA: Diagnosis not present

## 2016-12-23 DIAGNOSIS — D225 Melanocytic nevi of trunk: Secondary | ICD-10-CM | POA: Diagnosis not present

## 2016-12-23 DIAGNOSIS — L918 Other hypertrophic disorders of the skin: Secondary | ICD-10-CM | POA: Diagnosis not present

## 2016-12-23 DIAGNOSIS — L579 Skin changes due to chronic exposure to nonionizing radiation, unspecified: Secondary | ICD-10-CM | POA: Diagnosis not present

## 2016-12-23 DIAGNOSIS — L821 Other seborrheic keratosis: Secondary | ICD-10-CM | POA: Diagnosis not present

## 2016-12-23 DIAGNOSIS — L570 Actinic keratosis: Secondary | ICD-10-CM | POA: Diagnosis not present

## 2016-12-29 DIAGNOSIS — F4310 Post-traumatic stress disorder, unspecified: Secondary | ICD-10-CM | POA: Diagnosis not present

## 2016-12-29 DIAGNOSIS — F3181 Bipolar II disorder: Secondary | ICD-10-CM | POA: Diagnosis not present

## 2016-12-29 DIAGNOSIS — F4001 Agoraphobia with panic disorder: Secondary | ICD-10-CM | POA: Diagnosis not present

## 2016-12-29 DIAGNOSIS — Z79899 Other long term (current) drug therapy: Secondary | ICD-10-CM | POA: Diagnosis not present

## 2017-01-29 DIAGNOSIS — F4001 Agoraphobia with panic disorder: Secondary | ICD-10-CM | POA: Diagnosis not present

## 2017-01-29 DIAGNOSIS — F4310 Post-traumatic stress disorder, unspecified: Secondary | ICD-10-CM | POA: Diagnosis not present

## 2017-01-29 DIAGNOSIS — Z79899 Other long term (current) drug therapy: Secondary | ICD-10-CM | POA: Diagnosis not present

## 2017-01-29 DIAGNOSIS — R29818 Other symptoms and signs involving the nervous system: Secondary | ICD-10-CM | POA: Diagnosis not present

## 2017-01-29 DIAGNOSIS — F429 Obsessive-compulsive disorder, unspecified: Secondary | ICD-10-CM | POA: Diagnosis not present

## 2017-01-29 DIAGNOSIS — F3181 Bipolar II disorder: Secondary | ICD-10-CM | POA: Diagnosis not present

## 2017-01-29 DIAGNOSIS — I10 Essential (primary) hypertension: Secondary | ICD-10-CM | POA: Diagnosis not present

## 2017-01-30 ENCOUNTER — Encounter: Payer: BLUE CROSS/BLUE SHIELD | Admitting: Sports Medicine

## 2017-02-20 DIAGNOSIS — F3181 Bipolar II disorder: Secondary | ICD-10-CM | POA: Diagnosis not present

## 2017-02-26 DIAGNOSIS — I10 Essential (primary) hypertension: Secondary | ICD-10-CM | POA: Diagnosis not present

## 2017-02-26 DIAGNOSIS — F4310 Post-traumatic stress disorder, unspecified: Secondary | ICD-10-CM | POA: Diagnosis not present

## 2017-02-26 DIAGNOSIS — F4001 Agoraphobia with panic disorder: Secondary | ICD-10-CM | POA: Diagnosis not present

## 2017-02-26 DIAGNOSIS — F3181 Bipolar II disorder: Secondary | ICD-10-CM | POA: Diagnosis not present

## 2017-02-26 DIAGNOSIS — F422 Mixed obsessional thoughts and acts: Secondary | ICD-10-CM | POA: Diagnosis not present

## 2017-03-06 DIAGNOSIS — F3181 Bipolar II disorder: Secondary | ICD-10-CM | POA: Diagnosis not present

## 2017-03-27 DIAGNOSIS — F4310 Post-traumatic stress disorder, unspecified: Secondary | ICD-10-CM | POA: Diagnosis not present

## 2017-04-10 DIAGNOSIS — F3181 Bipolar II disorder: Secondary | ICD-10-CM | POA: Diagnosis not present

## 2017-04-24 DIAGNOSIS — F3181 Bipolar II disorder: Secondary | ICD-10-CM | POA: Diagnosis not present

## 2017-05-08 DIAGNOSIS — F3181 Bipolar II disorder: Secondary | ICD-10-CM | POA: Diagnosis not present

## 2017-05-22 DIAGNOSIS — F319 Bipolar disorder, unspecified: Secondary | ICD-10-CM | POA: Diagnosis not present

## 2017-05-22 DIAGNOSIS — F4310 Post-traumatic stress disorder, unspecified: Secondary | ICD-10-CM | POA: Diagnosis not present

## 2017-05-28 DIAGNOSIS — Z79899 Other long term (current) drug therapy: Secondary | ICD-10-CM | POA: Diagnosis not present

## 2017-05-28 DIAGNOSIS — F4310 Post-traumatic stress disorder, unspecified: Secondary | ICD-10-CM | POA: Diagnosis not present

## 2017-05-28 DIAGNOSIS — F3181 Bipolar II disorder: Secondary | ICD-10-CM | POA: Diagnosis not present

## 2017-05-28 DIAGNOSIS — I10 Essential (primary) hypertension: Secondary | ICD-10-CM | POA: Diagnosis not present

## 2017-05-28 DIAGNOSIS — F4001 Agoraphobia with panic disorder: Secondary | ICD-10-CM | POA: Diagnosis not present

## 2017-06-01 DIAGNOSIS — F4310 Post-traumatic stress disorder, unspecified: Secondary | ICD-10-CM | POA: Diagnosis not present

## 2017-06-01 DIAGNOSIS — F3181 Bipolar II disorder: Secondary | ICD-10-CM | POA: Diagnosis not present

## 2017-06-01 DIAGNOSIS — F4001 Agoraphobia with panic disorder: Secondary | ICD-10-CM | POA: Diagnosis not present

## 2017-06-12 DIAGNOSIS — F3181 Bipolar II disorder: Secondary | ICD-10-CM | POA: Diagnosis not present

## 2017-06-12 DIAGNOSIS — F4310 Post-traumatic stress disorder, unspecified: Secondary | ICD-10-CM | POA: Diagnosis not present

## 2017-06-22 DIAGNOSIS — L579 Skin changes due to chronic exposure to nonionizing radiation, unspecified: Secondary | ICD-10-CM | POA: Diagnosis not present

## 2017-06-22 DIAGNOSIS — Z85828 Personal history of other malignant neoplasm of skin: Secondary | ICD-10-CM | POA: Diagnosis not present

## 2017-06-22 DIAGNOSIS — L814 Other melanin hyperpigmentation: Secondary | ICD-10-CM | POA: Diagnosis not present

## 2017-06-22 DIAGNOSIS — L821 Other seborrheic keratosis: Secondary | ICD-10-CM | POA: Diagnosis not present

## 2017-06-22 DIAGNOSIS — L570 Actinic keratosis: Secondary | ICD-10-CM | POA: Diagnosis not present

## 2017-06-25 DIAGNOSIS — F429 Obsessive-compulsive disorder, unspecified: Secondary | ICD-10-CM | POA: Diagnosis not present

## 2017-06-25 DIAGNOSIS — I10 Essential (primary) hypertension: Secondary | ICD-10-CM | POA: Diagnosis not present

## 2017-06-25 DIAGNOSIS — F3181 Bipolar II disorder: Secondary | ICD-10-CM | POA: Diagnosis not present

## 2017-06-25 DIAGNOSIS — F4310 Post-traumatic stress disorder, unspecified: Secondary | ICD-10-CM | POA: Diagnosis not present

## 2017-06-25 DIAGNOSIS — F4001 Agoraphobia with panic disorder: Secondary | ICD-10-CM | POA: Diagnosis not present

## 2017-06-26 DIAGNOSIS — F4310 Post-traumatic stress disorder, unspecified: Secondary | ICD-10-CM | POA: Diagnosis not present

## 2017-06-26 DIAGNOSIS — F3181 Bipolar II disorder: Secondary | ICD-10-CM | POA: Diagnosis not present

## 2017-07-01 DIAGNOSIS — Z79899 Other long term (current) drug therapy: Secondary | ICD-10-CM | POA: Diagnosis not present

## 2017-07-01 DIAGNOSIS — F4001 Agoraphobia with panic disorder: Secondary | ICD-10-CM | POA: Diagnosis not present

## 2017-07-01 DIAGNOSIS — R0602 Shortness of breath: Secondary | ICD-10-CM | POA: Diagnosis not present

## 2017-07-01 DIAGNOSIS — F429 Obsessive-compulsive disorder, unspecified: Secondary | ICD-10-CM | POA: Diagnosis not present

## 2017-07-01 DIAGNOSIS — I10 Essential (primary) hypertension: Secondary | ICD-10-CM | POA: Diagnosis not present

## 2017-07-01 DIAGNOSIS — F3181 Bipolar II disorder: Secondary | ICD-10-CM | POA: Diagnosis not present

## 2017-07-01 DIAGNOSIS — F4310 Post-traumatic stress disorder, unspecified: Secondary | ICD-10-CM | POA: Diagnosis not present

## 2017-07-10 DIAGNOSIS — F4310 Post-traumatic stress disorder, unspecified: Secondary | ICD-10-CM | POA: Diagnosis not present

## 2017-07-10 DIAGNOSIS — F3181 Bipolar II disorder: Secondary | ICD-10-CM | POA: Diagnosis not present

## 2017-07-17 ENCOUNTER — Other Ambulatory Visit: Payer: Self-pay | Admitting: Sports Medicine

## 2017-07-30 DIAGNOSIS — F4310 Post-traumatic stress disorder, unspecified: Secondary | ICD-10-CM | POA: Diagnosis not present

## 2017-07-30 DIAGNOSIS — I10 Essential (primary) hypertension: Secondary | ICD-10-CM | POA: Diagnosis not present

## 2017-07-30 DIAGNOSIS — F4001 Agoraphobia with panic disorder: Secondary | ICD-10-CM | POA: Diagnosis not present

## 2017-07-30 DIAGNOSIS — F3181 Bipolar II disorder: Secondary | ICD-10-CM | POA: Diagnosis not present

## 2017-07-30 DIAGNOSIS — F429 Obsessive-compulsive disorder, unspecified: Secondary | ICD-10-CM | POA: Diagnosis not present

## 2017-08-07 DIAGNOSIS — F3181 Bipolar II disorder: Secondary | ICD-10-CM | POA: Diagnosis not present

## 2017-08-27 DIAGNOSIS — F3181 Bipolar II disorder: Secondary | ICD-10-CM | POA: Diagnosis not present

## 2017-08-27 DIAGNOSIS — F4001 Agoraphobia with panic disorder: Secondary | ICD-10-CM | POA: Diagnosis not present

## 2017-08-27 DIAGNOSIS — Z6835 Body mass index (BMI) 35.0-35.9, adult: Secondary | ICD-10-CM | POA: Diagnosis not present

## 2017-08-27 DIAGNOSIS — F429 Obsessive-compulsive disorder, unspecified: Secondary | ICD-10-CM | POA: Diagnosis not present

## 2017-08-27 DIAGNOSIS — F4310 Post-traumatic stress disorder, unspecified: Secondary | ICD-10-CM | POA: Diagnosis not present

## 2017-08-27 DIAGNOSIS — I10 Essential (primary) hypertension: Secondary | ICD-10-CM | POA: Diagnosis not present

## 2017-09-04 DIAGNOSIS — F3181 Bipolar II disorder: Secondary | ICD-10-CM | POA: Diagnosis not present

## 2017-09-04 DIAGNOSIS — F4310 Post-traumatic stress disorder, unspecified: Secondary | ICD-10-CM | POA: Diagnosis not present

## 2017-09-17 DIAGNOSIS — F3181 Bipolar II disorder: Secondary | ICD-10-CM | POA: Diagnosis not present

## 2017-10-01 DIAGNOSIS — F429 Obsessive-compulsive disorder, unspecified: Secondary | ICD-10-CM | POA: Diagnosis not present

## 2017-10-01 DIAGNOSIS — F4001 Agoraphobia with panic disorder: Secondary | ICD-10-CM | POA: Diagnosis not present

## 2017-10-01 DIAGNOSIS — I10 Essential (primary) hypertension: Secondary | ICD-10-CM | POA: Diagnosis not present

## 2017-10-01 DIAGNOSIS — F3181 Bipolar II disorder: Secondary | ICD-10-CM | POA: Diagnosis not present

## 2017-10-01 DIAGNOSIS — F4310 Post-traumatic stress disorder, unspecified: Secondary | ICD-10-CM | POA: Diagnosis not present

## 2017-10-02 DIAGNOSIS — F4310 Post-traumatic stress disorder, unspecified: Secondary | ICD-10-CM | POA: Diagnosis not present

## 2017-10-02 DIAGNOSIS — F3181 Bipolar II disorder: Secondary | ICD-10-CM | POA: Diagnosis not present

## 2017-10-14 DIAGNOSIS — G5711 Meralgia paresthetica, right lower limb: Secondary | ICD-10-CM | POA: Diagnosis not present

## 2017-10-14 DIAGNOSIS — I10 Essential (primary) hypertension: Secondary | ICD-10-CM | POA: Diagnosis not present

## 2017-10-14 DIAGNOSIS — K2970 Gastritis, unspecified, without bleeding: Secondary | ICD-10-CM | POA: Diagnosis not present

## 2017-10-15 DIAGNOSIS — F4310 Post-traumatic stress disorder, unspecified: Secondary | ICD-10-CM | POA: Diagnosis not present

## 2017-10-15 DIAGNOSIS — F4001 Agoraphobia with panic disorder: Secondary | ICD-10-CM | POA: Diagnosis not present

## 2017-10-15 DIAGNOSIS — F3181 Bipolar II disorder: Secondary | ICD-10-CM | POA: Diagnosis not present

## 2017-10-15 DIAGNOSIS — I10 Essential (primary) hypertension: Secondary | ICD-10-CM | POA: Diagnosis not present

## 2017-10-15 DIAGNOSIS — F429 Obsessive-compulsive disorder, unspecified: Secondary | ICD-10-CM | POA: Diagnosis not present

## 2017-10-16 DIAGNOSIS — F3181 Bipolar II disorder: Secondary | ICD-10-CM | POA: Diagnosis not present

## 2017-10-16 DIAGNOSIS — F4310 Post-traumatic stress disorder, unspecified: Secondary | ICD-10-CM | POA: Diagnosis not present

## 2017-11-06 DIAGNOSIS — F3181 Bipolar II disorder: Secondary | ICD-10-CM | POA: Diagnosis not present

## 2017-11-06 DIAGNOSIS — F4310 Post-traumatic stress disorder, unspecified: Secondary | ICD-10-CM | POA: Diagnosis not present

## 2017-11-19 DIAGNOSIS — F3181 Bipolar II disorder: Secondary | ICD-10-CM | POA: Diagnosis not present

## 2017-11-19 DIAGNOSIS — F4310 Post-traumatic stress disorder, unspecified: Secondary | ICD-10-CM | POA: Diagnosis not present

## 2017-11-23 ENCOUNTER — Encounter: Payer: Self-pay | Admitting: Emergency Medicine

## 2017-11-23 ENCOUNTER — Emergency Department (INDEPENDENT_AMBULATORY_CARE_PROVIDER_SITE_OTHER)
Admission: EM | Admit: 2017-11-23 | Discharge: 2017-11-23 | Disposition: A | Discharge status: Home / Self Care | Payer: BLUE CROSS/BLUE SHIELD | Source: Home / Self Care | Attending: Family Medicine | Admitting: Family Medicine

## 2017-11-23 ENCOUNTER — Emergency Department (INDEPENDENT_AMBULATORY_CARE_PROVIDER_SITE_OTHER): Payer: BLUE CROSS/BLUE SHIELD

## 2017-11-23 DIAGNOSIS — M545 Low back pain, unspecified: Secondary | ICD-10-CM

## 2017-11-23 DIAGNOSIS — M48061 Spinal stenosis, lumbar region without neurogenic claudication: Secondary | ICD-10-CM | POA: Diagnosis not present

## 2017-11-23 DIAGNOSIS — M5137 Other intervertebral disc degeneration, lumbosacral region: Secondary | ICD-10-CM

## 2017-11-23 LAB — POCT URINALYSIS DIP (MANUAL ENTRY)
BILIRUBIN UA: NEGATIVE
GLUCOSE UA: NEGATIVE mg/dL
Ketones, POC UA: NEGATIVE mg/dL
Leukocytes, UA: NEGATIVE
Nitrite, UA: NEGATIVE
Protein Ur, POC: NEGATIVE mg/dL
RBC UA: NEGATIVE
SPEC GRAV UA: 1.015 (ref 1.010–1.025)
Urobilinogen, UA: 0.2 E.U./dL
pH, UA: 7 (ref 5.0–8.0)

## 2017-11-23 MED ORDER — PREDNISONE 20 MG PO TABS: ORAL_TABLET | ORAL | 0 refills | Status: DC

## 2017-11-23 MED ORDER — CYCLOBENZAPRINE HCL 10 MG PO TABS: 10.0000 mg | ORAL_TABLET | Freq: Every day | ORAL | 0 refills | Status: DC

## 2017-11-23 NOTE — ED Provider Notes (Signed)
[name]    CSN: [id number] Arrival date & time: 11/23/17  0835     History   Chief Complaint Chief Complaint  Patient presents with  . Back Pain    HPI Stephen Myers is a 57 y.o. male.   Patient complains of gradual onset of non-radiating constant pain in his right lower back for one week.  He feels that the area has been mildly swollen but denies rash.  He recalls no injury.   He denies bowel or bladder dysfunction, and no saddle numbness.     The history is provided by the patient and the spouse.  Back Pain  Location:  Lumbar spine Quality:  Aching Radiates to:  Does not radiate Pain severity:  Moderate Pain is:  Same all the time Onset quality:  Gradual Duration:  1 week Timing:  Constant Progression:  Unchanged Chronicity:  New Context: not falling, not physical stress, not recent illness and not recent injury   Relieved by:  Nothing Worsened by:  Ambulation, bending, lying down and movement Ineffective treatments:  NSAIDs (gabapentin) Associated symptoms: no abdominal pain, no abdominal swelling, no bladder incontinence, no bowel incontinence, no chest pain, no dysuria, no fever, no leg pain, no numbness, no paresthesias, no pelvic pain, no perianal numbness, no tingling, no weakness and no weight loss   Risk factors: obesity     Past Medical History:  Diagnosis Date  . Anxiety   . Depression   . Irritability and anger    symptoms at home and work    Patient Active Problem List   Diagnosis Date Noted  . 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 Plantar fasciitis, left 04/08/2016  . Suicidal ideation 07/07/2013  . Generalized anxiety disorder 01/12/2013  . Annual physical exam 10/12/2012  . Hyperlipidemia 09/23/2012  . Stuttering 09/09/2012  . 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 Post traumatic stress disorder 02/18/2012  . Depression, major, recurrent, moderate ([place]) 12/22/2011  . OCD (obsessive compulsive disorder)   .  Hypertension 11/25/2010    Past Surgical History:  Procedure Laterality Date  . APPENDECTOMY    . COSMETIC SURGERY    . TONSILLECTOMY         Home Medications    Prior to Admission medications   Medication Sig Start Date End Date Taking? Authorizing Provider  trazodone (DESYREL) 300 MG tablet Take 300 mg by mouth at bedtime.   Yes [provider]  aspirin 81 MG tablet Take 1 tablet (81 mg total) by mouth daily. 02/01/15   Silverio Decamp, MD  atorvastatin (LIPITOR) 40 MG tablet TAKE 1 TABLET (40 MG TOTAL) BY MOUTH DAILY. 07/17/17   Silverio Decamp, MD  lithium carbonate (ESKALITH) 450 MG CR tablet Take 450 mg by mouth 2 (two) times daily.  03/27/16   [provider]  losartan-hydrochlorothiazide (HYZAAR) 50-12.5 MG tablet Take 1 tablet by mouth daily. 06/09/16   Silverio Decamp, MD  naltrexone (DEPADE) 50 MG tablet TAKE 1 TABLET BY MOUTH EVERY DAY 04/20/14   Nena Polio T, PA-C  predniSONE (DELTASONE) 20 MG tablet Take one tab by mouth twice daily for 5 days, then one daily for 3 days. Take with food. 11/23/17   Kandra Nicolas, MD  rOPINIRole (REQUIP) 0.25 MG tablet  04/24/16   [provider]  rOPINIRole (REQUIP) 2 MG tablet Take by mouth. 04/24/16   [provider]  vitamin B-12 (CYANOCOBALAMIN) 500 MCG tablet Take 2 tablets (1,000 mcg  total) by mouth daily. For low iron deficiency. 07/11/13   Nena Polio T, PA-C  ziprasidone (GEODON) 40 MG capsule Take 40 mg by mouth every morning.    [provider]  ziprasidone (GEODON) 80 MG capsule  04/24/16   [provider]    Family History Family History  Problem Relation Age of Onset  . Depression Mother   . Cancer Mother        breast  . Bipolar disorder Father   . Stroke Father   . Depression Maternal Grandmother   . Bipolar disorder Daughter   . Drug abuse Daughter   . Depression Brother     Social History Social History   Tobacco Use  . Smoking  status: Former Smoker    Packs/day: 0.00    Years: 15.00    Pack years: 0.00    Types: Cigarettes    Last attempt to quit: 11/03/2000    Years since quitting: 17.0  . Smokeless tobacco: Never Used  Substance Use Topics  . Alcohol use: Yes    Alcohol/week: 0.0 oz    Comment: rarely-2 drinks 12/2013  . Drug use: No    Comment: Caffeine: Coffee 2 cups per day. Caffeinated Beverages (green tea) 16 ounces per day.      Allergies   Benztropine and Trihexyphenidyl   Review of Systems Review of Systems  Constitutional: Negative for fever and weight loss.  Cardiovascular: Negative for chest pain.  Gastrointestinal: Negative for abdominal pain and bowel incontinence.  Genitourinary: Negative for bladder incontinence, dysuria and pelvic pain.  Musculoskeletal: Positive for back pain.  Neurological: Negative for tingling, weakness, numbness and paresthesias.  All other systems reviewed and are negative.    Physical Exam Triage Vital Signs ED Triage Vitals  Enc Vitals Group     BP 11/23/17 0901 130/80     Pulse Rate 11/23/17 0901 60     Resp --      Temp 11/23/17 0901 98 F (36.7 C)     Temp Source 11/23/17 0901 Oral     SpO2 11/23/17 0901 98 %     Weight 11/23/17 0901 282 lb (127.9 kg)     Height --      Head Circumference --      Peak Flow --      Pain Score 11/23/17 0902 7     Pain Loc --      Pain Edu? --      Excl. in [place]? --    No data found.  Updated Vital Signs BP 130/80 (BP Location: Right Arm)   Pulse 60   Temp 98 F (36.7 C) (Oral)   Wt 282 lb (127.9 kg)   SpO2 98%   BMI 34.33 kg/m   Visual Acuity Right Eye Distance:   Left Eye Distance:   Bilateral Distance:    Right Eye Near:   Left Eye Near:    Bilateral Near:     Physical Exam  Constitutional: He appears well-developed. No distress.  HENT:  Head: Normocephalic.  Mouth/Throat: Oropharynx is clear and moist.  Eyes: Pupils are equal, round, and reactive to light.  Neck: Neck supple.    Cardiovascular: Normal heart sounds.  Pulmonary/Chest: Breath sounds normal.  Abdominal: Soft. There is no tenderness.  Musculoskeletal: He exhibits no edema.       Lumbar back: He exhibits decreased range of motion, tenderness and swelling. He exhibits no bony tenderness.       Back:  Back:   Decreased  range of motion.  Tenderness in the midline and right paraspinous muscles from L3 to Sacral area.  Tenderness extends to right flank.  Straight leg raising test is negative.  Sitting knee extension test is negative.  Strength and sensation in the lower extremities is normal.  Patellar and achilles reflexes are normal   Lymphadenopathy:    He has no cervical adenopathy.  Neurological: He is alert.  Skin: Skin is warm and dry. No rash noted.  Nursing note and vitals reviewed.    UC Treatments / Results  Labs (all labs ordered are listed, but only abnormal results are displayed) Labs Reviewed  POCT URINALYSIS DIP (MANUAL ENTRY) negative    EKG  EKG Interpretation None       Radiology Dg Lumbar Spine 2-3 Views  Result Date: 11/23/2017 CLINICAL DATA:  Right lower back pain for the past week. No known injury. EXAM: LUMBAR SPINE - 2-3 VIEW COMPARISON:  None in PACs FINDINGS: The lumbar vertebral bodies are preserved in height. The disc space heights are reasonably well-maintained with exception of L5-S1 where there is mild narrowing. There is no spondylolisthesis. The pedicles and transverse processes are intact. IMPRESSION: There is mild degenerative disc disease centered at L5-S1. There is no acute bony abnormality. Electronically Signed   By: David  Martinique M.D.   On: 11/23/2017 09:36    Procedures Procedures (including critical care time)  Medications Ordered in UC Medications - No data to display   Initial Impression / Assessment and Plan / UC Course  I have reviewed the triage vital signs and the nursing notes.  Pertinent labs & imaging results that were available during  my care of the patient were reviewed by me and considered in my medical decision making (see chart for details).    Suspect muscular strain. Begin prednisone burst/taper.  Rx for Flexeril 10mg  HS. Apply ice pack for 20 to 30 minutes, 3 to 4 times daily  Continue until pain and swelling decrease. May take Tylenol as needed for pain.  Begin range of motion and stretching exercises as tolerated. Followup with Dr. Aundria Mems or Dr. Lynne Leader ([facility]) if not improving about two weeks.     Final Clinical Impressions(s) / UC Diagnoses   Final diagnoses:  Right-sided low back pain without sciatica, unspecified chronicity    ED Discharge Orders        Ordered    predniSONE (DELTASONE) 20 MG tablet     11/23/17 1004           Kandra Nicolas, MD 11/28/17 1521

## 2017-11-23 NOTE — ED Triage Notes (Signed)
Pt states hx of back pain but in the last week it has worsened. C/o constant low back pain and states is feels swollen. No relief with motrin.

## 2017-11-23 NOTE — Discharge Instructions (Signed)
Apply ice pack for 20 to 30 minutes, 3 to 4 times daily  Continue until pain and swelling decrease. May take Tylenol as needed for pain.  Begin range of motion and stretching exercises as tolerated. °

## 2017-12-01 DIAGNOSIS — K219 Gastro-esophageal reflux disease without esophagitis: Secondary | ICD-10-CM | POA: Diagnosis not present

## 2017-12-01 DIAGNOSIS — I10 Essential (primary) hypertension: Secondary | ICD-10-CM | POA: Diagnosis not present

## 2017-12-01 DIAGNOSIS — F3181 Bipolar II disorder: Secondary | ICD-10-CM | POA: Diagnosis not present

## 2017-12-01 DIAGNOSIS — E7800 Pure hypercholesterolemia, unspecified: Secondary | ICD-10-CM | POA: Diagnosis not present

## 2017-12-04 DIAGNOSIS — F3181 Bipolar II disorder: Secondary | ICD-10-CM | POA: Diagnosis not present

## 2017-12-04 DIAGNOSIS — F4310 Post-traumatic stress disorder, unspecified: Secondary | ICD-10-CM | POA: Diagnosis not present

## 2017-12-18 DIAGNOSIS — F4310 Post-traumatic stress disorder, unspecified: Secondary | ICD-10-CM | POA: Diagnosis not present

## 2017-12-18 DIAGNOSIS — F319 Bipolar disorder, unspecified: Secondary | ICD-10-CM | POA: Diagnosis not present

## 2017-12-29 DIAGNOSIS — F4310 Post-traumatic stress disorder, unspecified: Secondary | ICD-10-CM | POA: Diagnosis not present

## 2017-12-29 DIAGNOSIS — F3181 Bipolar II disorder: Secondary | ICD-10-CM | POA: Diagnosis not present

## 2018-01-07 DIAGNOSIS — F3181 Bipolar II disorder: Secondary | ICD-10-CM | POA: Diagnosis not present

## 2018-01-07 DIAGNOSIS — F4001 Agoraphobia with panic disorder: Secondary | ICD-10-CM | POA: Diagnosis not present

## 2018-01-07 DIAGNOSIS — I10 Essential (primary) hypertension: Secondary | ICD-10-CM | POA: Diagnosis not present

## 2018-01-07 DIAGNOSIS — F4310 Post-traumatic stress disorder, unspecified: Secondary | ICD-10-CM | POA: Diagnosis not present

## 2018-01-07 DIAGNOSIS — F429 Obsessive-compulsive disorder, unspecified: Secondary | ICD-10-CM | POA: Diagnosis not present

## 2018-01-15 DIAGNOSIS — F319 Bipolar disorder, unspecified: Secondary | ICD-10-CM | POA: Diagnosis not present

## 2018-01-28 DIAGNOSIS — D538 Other specified nutritional anemias: Secondary | ICD-10-CM | POA: Diagnosis not present

## 2018-01-28 DIAGNOSIS — G5710 Meralgia paresthetica, unspecified lower limb: Secondary | ICD-10-CM | POA: Diagnosis not present

## 2018-01-28 DIAGNOSIS — R739 Hyperglycemia, unspecified: Secondary | ICD-10-CM | POA: Diagnosis not present

## 2018-01-28 DIAGNOSIS — Z79899 Other long term (current) drug therapy: Secondary | ICD-10-CM | POA: Diagnosis not present

## 2018-01-28 DIAGNOSIS — E538 Deficiency of other specified B group vitamins: Secondary | ICD-10-CM | POA: Diagnosis not present

## 2018-01-28 DIAGNOSIS — I10 Essential (primary) hypertension: Secondary | ICD-10-CM | POA: Diagnosis not present

## 2018-01-28 DIAGNOSIS — E7800 Pure hypercholesterolemia, unspecified: Secondary | ICD-10-CM | POA: Diagnosis not present

## 2018-01-29 DIAGNOSIS — F3181 Bipolar II disorder: Secondary | ICD-10-CM | POA: Diagnosis not present

## 2018-01-29 DIAGNOSIS — F4310 Post-traumatic stress disorder, unspecified: Secondary | ICD-10-CM | POA: Diagnosis not present

## 2018-02-11 IMAGING — DX DG LUMBAR SPINE 2-3V
3 series · 3 of 3 positions shown · non-contrast
Comparison: None in PACs

CLINICAL DATA: Right lower back pain for the past week. No known
injury.

EXAM:
LUMBAR SPINE - 2-3 VIEW

[l-spine ap]
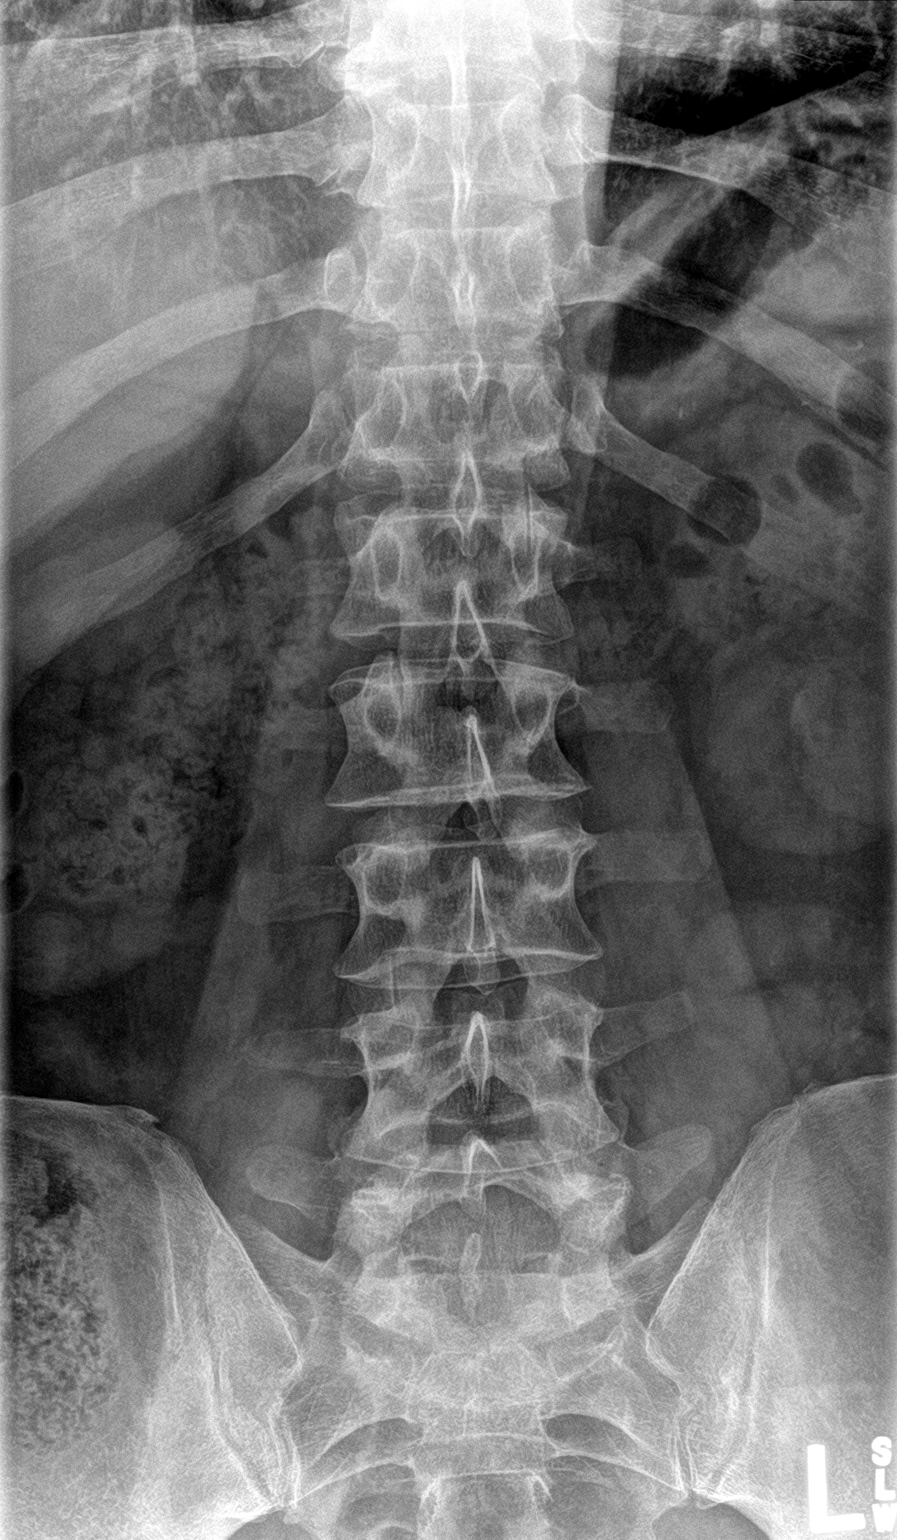

[l-spine lat]
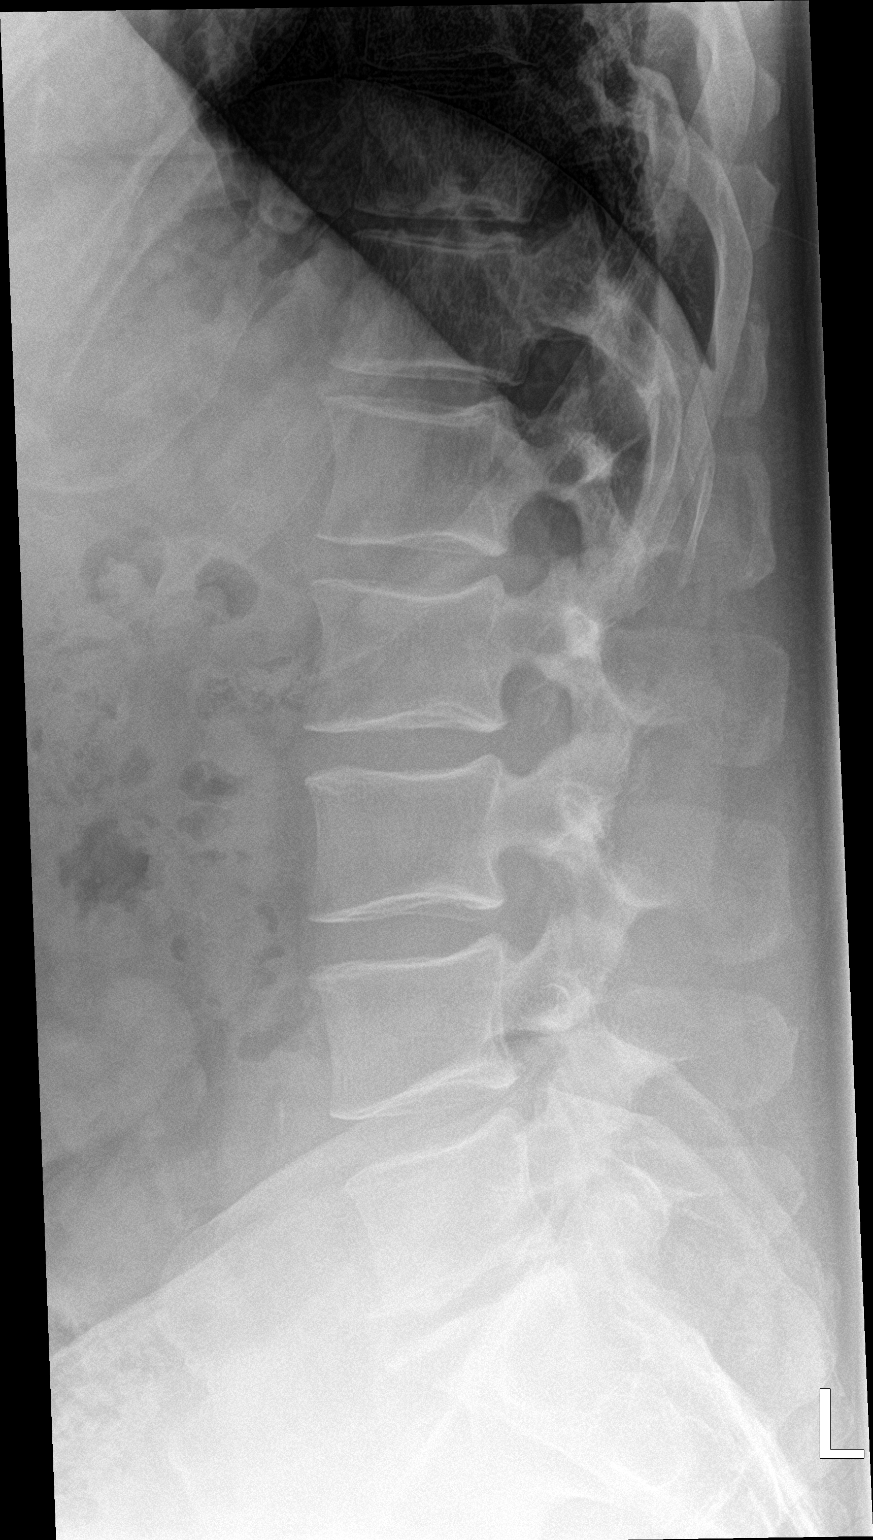

[l-spine spot]
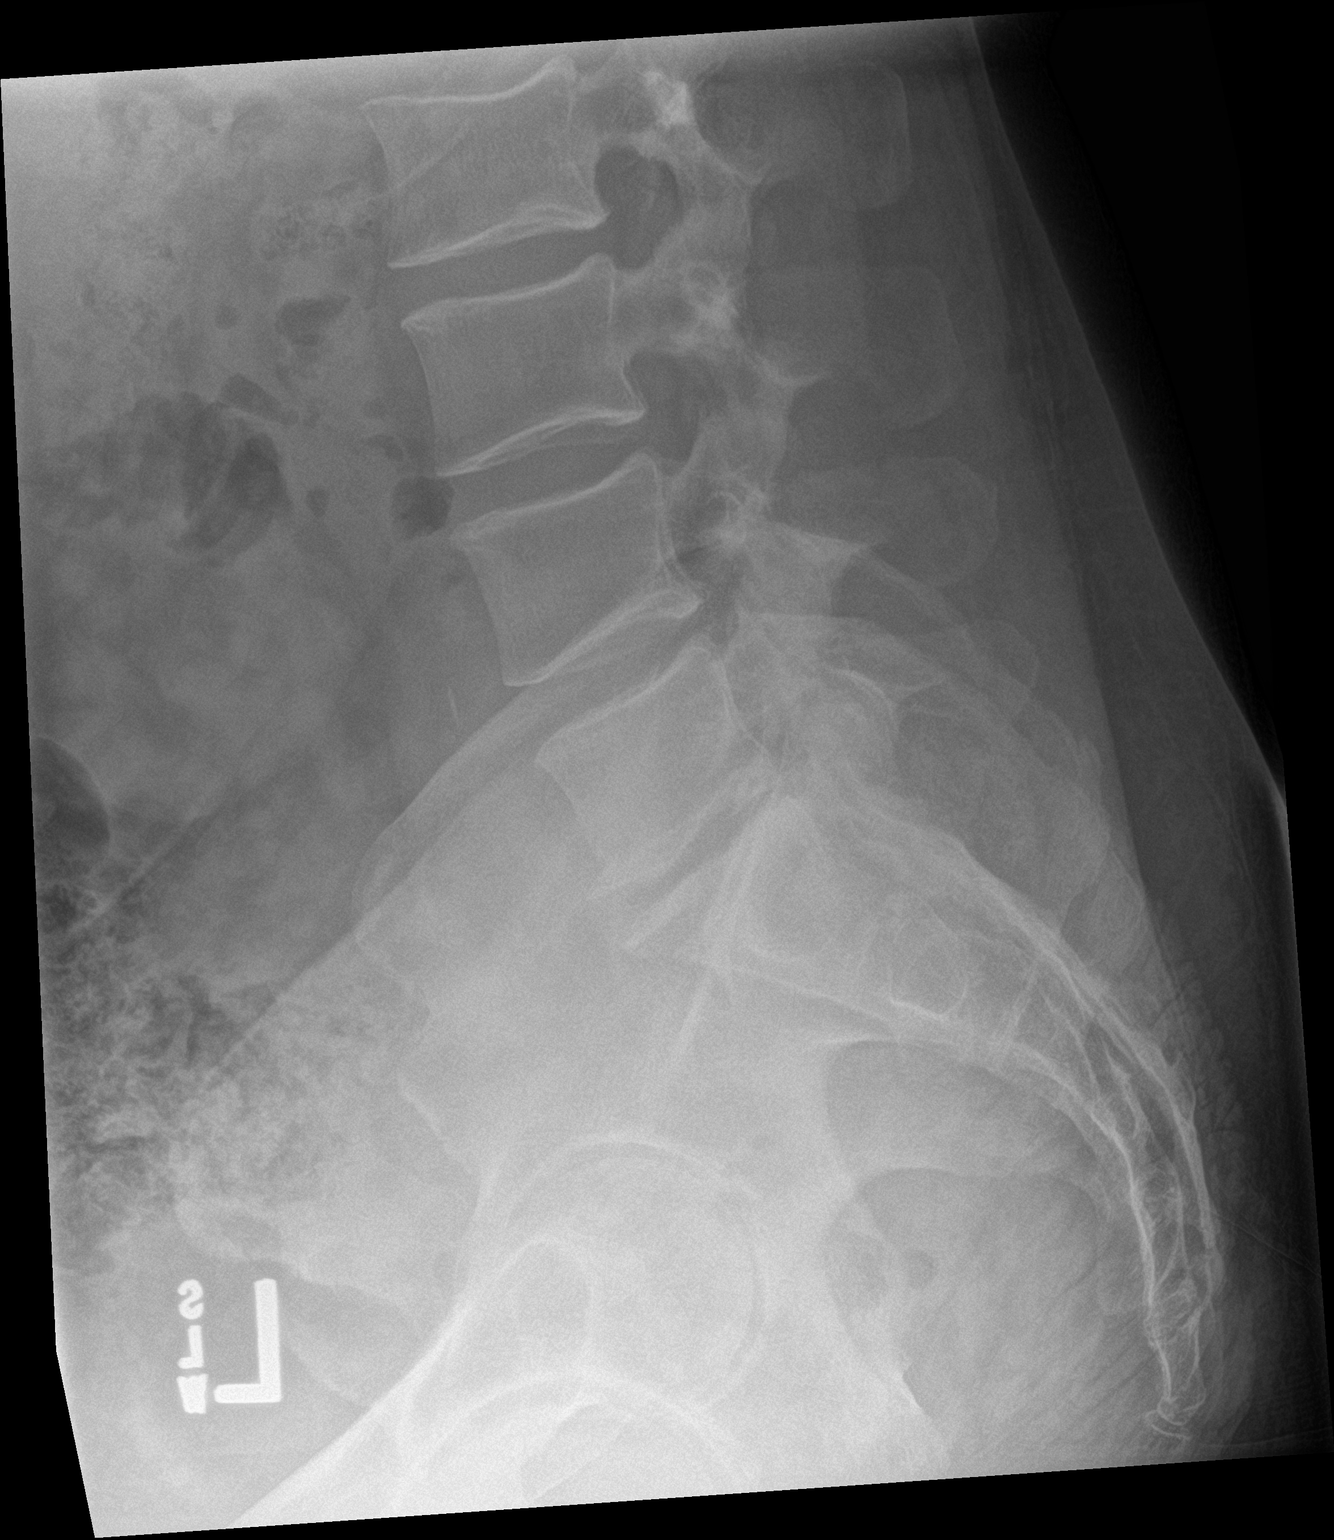

[3 of 3 positions shown; findings below may reference images not displayed]

FINDINGS: The lumbar vertebral bodies are preserved in height. The disc space
heights are reasonably well-maintained with exception of L5-S1 where
there is mild narrowing. There is no spondylolisthesis. The pedicles
and transverse processes are intact.
IMPRESSION: There is mild degenerative disc disease centered at L5-S1. There is
no acute bony abnormality.

## 2018-02-12 DIAGNOSIS — F4310 Post-traumatic stress disorder, unspecified: Secondary | ICD-10-CM | POA: Diagnosis not present

## 2018-02-12 DIAGNOSIS — F3181 Bipolar II disorder: Secondary | ICD-10-CM | POA: Diagnosis not present

## 2018-02-15 DIAGNOSIS — I10 Essential (primary) hypertension: Secondary | ICD-10-CM | POA: Diagnosis not present

## 2018-02-15 DIAGNOSIS — F319 Bipolar disorder, unspecified: Secondary | ICD-10-CM | POA: Diagnosis not present

## 2018-02-15 DIAGNOSIS — F419 Anxiety disorder, unspecified: Secondary | ICD-10-CM | POA: Diagnosis not present

## 2018-02-15 DIAGNOSIS — E785 Hyperlipidemia, unspecified: Secondary | ICD-10-CM | POA: Diagnosis not present

## 2018-02-15 DIAGNOSIS — M170 Bilateral primary osteoarthritis of knee: Secondary | ICD-10-CM | POA: Diagnosis not present

## 2018-02-15 DIAGNOSIS — R2242 Localized swelling, mass and lump, left lower limb: Secondary | ICD-10-CM | POA: Diagnosis not present

## 2018-02-15 DIAGNOSIS — M7989 Other specified soft tissue disorders: Secondary | ICD-10-CM | POA: Diagnosis not present

## 2018-02-15 DIAGNOSIS — R600 Localized edema: Secondary | ICD-10-CM | POA: Diagnosis not present

## 2018-02-15 DIAGNOSIS — R2241 Localized swelling, mass and lump, right lower limb: Secondary | ICD-10-CM | POA: Diagnosis not present

## 2018-02-15 DIAGNOSIS — Z888 Allergy status to other drugs, medicaments and biological substances status: Secondary | ICD-10-CM | POA: Diagnosis not present

## 2018-02-15 DIAGNOSIS — R0602 Shortness of breath: Secondary | ICD-10-CM | POA: Diagnosis not present

## 2018-02-15 DIAGNOSIS — Z87891 Personal history of nicotine dependence: Secondary | ICD-10-CM | POA: Diagnosis not present

## 2018-02-25 DIAGNOSIS — F3181 Bipolar II disorder: Secondary | ICD-10-CM | POA: Diagnosis not present

## 2018-02-25 DIAGNOSIS — F4310 Post-traumatic stress disorder, unspecified: Secondary | ICD-10-CM | POA: Diagnosis not present

## 2018-02-25 DIAGNOSIS — F429 Obsessive-compulsive disorder, unspecified: Secondary | ICD-10-CM | POA: Diagnosis not present

## 2018-02-25 DIAGNOSIS — F4001 Agoraphobia with panic disorder: Secondary | ICD-10-CM | POA: Diagnosis not present

## 2018-02-25 DIAGNOSIS — I10 Essential (primary) hypertension: Secondary | ICD-10-CM | POA: Diagnosis not present

## 2018-02-26 DIAGNOSIS — F3181 Bipolar II disorder: Secondary | ICD-10-CM | POA: Diagnosis not present

## 2018-02-26 DIAGNOSIS — F4310 Post-traumatic stress disorder, unspecified: Secondary | ICD-10-CM | POA: Diagnosis not present

## 2018-03-12 DIAGNOSIS — F4310 Post-traumatic stress disorder, unspecified: Secondary | ICD-10-CM | POA: Diagnosis not present

## 2018-03-12 DIAGNOSIS — F3181 Bipolar II disorder: Secondary | ICD-10-CM | POA: Diagnosis not present

## 2018-03-18 DIAGNOSIS — I10 Essential (primary) hypertension: Secondary | ICD-10-CM | POA: Diagnosis not present

## 2018-03-18 DIAGNOSIS — Z79899 Other long term (current) drug therapy: Secondary | ICD-10-CM | POA: Diagnosis not present

## 2018-03-18 DIAGNOSIS — F3181 Bipolar II disorder: Secondary | ICD-10-CM | POA: Diagnosis not present

## 2018-03-18 DIAGNOSIS — F4310 Post-traumatic stress disorder, unspecified: Secondary | ICD-10-CM | POA: Diagnosis not present

## 2018-03-18 DIAGNOSIS — F4001 Agoraphobia with panic disorder: Secondary | ICD-10-CM | POA: Diagnosis not present

## 2018-03-26 DIAGNOSIS — F4310 Post-traumatic stress disorder, unspecified: Secondary | ICD-10-CM | POA: Diagnosis not present

## 2018-03-26 DIAGNOSIS — F3181 Bipolar II disorder: Secondary | ICD-10-CM | POA: Diagnosis not present

## 2018-04-09 DIAGNOSIS — F4310 Post-traumatic stress disorder, unspecified: Secondary | ICD-10-CM | POA: Diagnosis not present

## 2018-04-09 DIAGNOSIS — F3181 Bipolar II disorder: Secondary | ICD-10-CM | POA: Diagnosis not present

## 2018-04-23 DIAGNOSIS — F3181 Bipolar II disorder: Secondary | ICD-10-CM | POA: Diagnosis not present

## 2018-04-23 DIAGNOSIS — F4310 Post-traumatic stress disorder, unspecified: Secondary | ICD-10-CM | POA: Diagnosis not present

## 2018-04-29 DIAGNOSIS — F4001 Agoraphobia with panic disorder: Secondary | ICD-10-CM | POA: Diagnosis not present

## 2018-04-29 DIAGNOSIS — F429 Obsessive-compulsive disorder, unspecified: Secondary | ICD-10-CM | POA: Diagnosis not present

## 2018-04-29 DIAGNOSIS — I10 Essential (primary) hypertension: Secondary | ICD-10-CM | POA: Diagnosis not present

## 2018-04-29 DIAGNOSIS — F4310 Post-traumatic stress disorder, unspecified: Secondary | ICD-10-CM | POA: Diagnosis not present

## 2018-04-29 DIAGNOSIS — Z79899 Other long term (current) drug therapy: Secondary | ICD-10-CM | POA: Diagnosis not present

## 2018-04-29 DIAGNOSIS — F3181 Bipolar II disorder: Secondary | ICD-10-CM | POA: Diagnosis not present

## 2018-05-11 DIAGNOSIS — Z85828 Personal history of other malignant neoplasm of skin: Secondary | ICD-10-CM | POA: Diagnosis not present

## 2018-05-11 DIAGNOSIS — D225 Melanocytic nevi of trunk: Secondary | ICD-10-CM | POA: Diagnosis not present

## 2018-05-11 DIAGNOSIS — L821 Other seborrheic keratosis: Secondary | ICD-10-CM | POA: Diagnosis not present

## 2018-05-11 DIAGNOSIS — D2270 Melanocytic nevi of unspecified lower limb, including hip: Secondary | ICD-10-CM | POA: Diagnosis not present

## 2018-05-11 DIAGNOSIS — L814 Other melanin hyperpigmentation: Secondary | ICD-10-CM | POA: Diagnosis not present

## 2018-05-11 DIAGNOSIS — L570 Actinic keratosis: Secondary | ICD-10-CM | POA: Diagnosis not present

## 2018-05-11 DIAGNOSIS — D2239 Melanocytic nevi of other parts of face: Secondary | ICD-10-CM | POA: Diagnosis not present

## 2018-05-11 DIAGNOSIS — D2339 Other benign neoplasm of skin of other parts of face: Secondary | ICD-10-CM | POA: Diagnosis not present

## 2018-05-11 DIAGNOSIS — D492 Neoplasm of unspecified behavior of bone, soft tissue, and skin: Secondary | ICD-10-CM | POA: Diagnosis not present

## 2018-05-11 DIAGNOSIS — D2260 Melanocytic nevi of unspecified upper limb, including shoulder: Secondary | ICD-10-CM | POA: Diagnosis not present

## 2018-05-11 DIAGNOSIS — Q809 Congenital ichthyosis, unspecified: Secondary | ICD-10-CM | POA: Diagnosis not present

## 2018-05-14 DIAGNOSIS — F3181 Bipolar II disorder: Secondary | ICD-10-CM | POA: Diagnosis not present

## 2018-05-14 DIAGNOSIS — F4310 Post-traumatic stress disorder, unspecified: Secondary | ICD-10-CM | POA: Diagnosis not present

## 2018-05-27 DIAGNOSIS — F429 Obsessive-compulsive disorder, unspecified: Secondary | ICD-10-CM | POA: Diagnosis not present

## 2018-05-27 DIAGNOSIS — I10 Essential (primary) hypertension: Secondary | ICD-10-CM | POA: Diagnosis not present

## 2018-05-27 DIAGNOSIS — F3181 Bipolar II disorder: Secondary | ICD-10-CM | POA: Diagnosis not present

## 2018-05-27 DIAGNOSIS — F4001 Agoraphobia with panic disorder: Secondary | ICD-10-CM | POA: Diagnosis not present

## 2018-05-27 DIAGNOSIS — Z79899 Other long term (current) drug therapy: Secondary | ICD-10-CM | POA: Diagnosis not present

## 2018-05-27 DIAGNOSIS — F4310 Post-traumatic stress disorder, unspecified: Secondary | ICD-10-CM | POA: Diagnosis not present

## 2018-05-28 DIAGNOSIS — F4310 Post-traumatic stress disorder, unspecified: Secondary | ICD-10-CM | POA: Diagnosis not present

## 2018-05-28 DIAGNOSIS — F3181 Bipolar II disorder: Secondary | ICD-10-CM | POA: Diagnosis not present

## 2018-06-11 DIAGNOSIS — F3181 Bipolar II disorder: Secondary | ICD-10-CM | POA: Diagnosis not present

## 2018-06-11 DIAGNOSIS — F4310 Post-traumatic stress disorder, unspecified: Secondary | ICD-10-CM | POA: Diagnosis not present

## 2018-06-14 DIAGNOSIS — R0602 Shortness of breath: Secondary | ICD-10-CM | POA: Diagnosis not present

## 2018-06-15 DIAGNOSIS — R0602 Shortness of breath: Secondary | ICD-10-CM | POA: Diagnosis not present

## 2018-06-24 DIAGNOSIS — F3181 Bipolar II disorder: Secondary | ICD-10-CM | POA: Diagnosis not present

## 2018-06-24 DIAGNOSIS — I10 Essential (primary) hypertension: Secondary | ICD-10-CM | POA: Diagnosis not present

## 2018-06-24 DIAGNOSIS — F4001 Agoraphobia with panic disorder: Secondary | ICD-10-CM | POA: Diagnosis not present

## 2018-06-24 DIAGNOSIS — F429 Obsessive-compulsive disorder, unspecified: Secondary | ICD-10-CM | POA: Diagnosis not present

## 2018-06-24 DIAGNOSIS — F4310 Post-traumatic stress disorder, unspecified: Secondary | ICD-10-CM | POA: Diagnosis not present

## 2018-06-25 DIAGNOSIS — F319 Bipolar disorder, unspecified: Secondary | ICD-10-CM | POA: Diagnosis not present

## 2018-06-25 DIAGNOSIS — F4310 Post-traumatic stress disorder, unspecified: Secondary | ICD-10-CM | POA: Diagnosis not present

## 2018-06-30 DIAGNOSIS — R0602 Shortness of breath: Secondary | ICD-10-CM | POA: Diagnosis not present

## 2018-06-30 DIAGNOSIS — I10 Essential (primary) hypertension: Secondary | ICD-10-CM | POA: Diagnosis not present

## 2018-07-06 DIAGNOSIS — I517 Cardiomegaly: Secondary | ICD-10-CM | POA: Diagnosis not present

## 2018-07-06 DIAGNOSIS — I059 Rheumatic mitral valve disease, unspecified: Secondary | ICD-10-CM | POA: Diagnosis not present

## 2018-07-09 DIAGNOSIS — F4310 Post-traumatic stress disorder, unspecified: Secondary | ICD-10-CM | POA: Diagnosis not present

## 2018-07-09 DIAGNOSIS — F319 Bipolar disorder, unspecified: Secondary | ICD-10-CM | POA: Diagnosis not present

## 2018-07-21 DIAGNOSIS — F4310 Post-traumatic stress disorder, unspecified: Secondary | ICD-10-CM | POA: Diagnosis not present

## 2018-07-21 DIAGNOSIS — F319 Bipolar disorder, unspecified: Secondary | ICD-10-CM | POA: Diagnosis not present

## 2018-07-21 DIAGNOSIS — F4001 Agoraphobia with panic disorder: Secondary | ICD-10-CM | POA: Diagnosis not present

## 2018-07-22 DIAGNOSIS — F4310 Post-traumatic stress disorder, unspecified: Secondary | ICD-10-CM | POA: Diagnosis not present

## 2018-07-22 DIAGNOSIS — Z79899 Other long term (current) drug therapy: Secondary | ICD-10-CM | POA: Diagnosis not present

## 2018-07-22 DIAGNOSIS — F4001 Agoraphobia with panic disorder: Secondary | ICD-10-CM | POA: Diagnosis not present

## 2018-07-22 DIAGNOSIS — I10 Essential (primary) hypertension: Secondary | ICD-10-CM | POA: Diagnosis not present

## 2018-07-22 DIAGNOSIS — F319 Bipolar disorder, unspecified: Secondary | ICD-10-CM | POA: Diagnosis not present

## 2018-07-22 DIAGNOSIS — F429 Obsessive-compulsive disorder, unspecified: Secondary | ICD-10-CM | POA: Diagnosis not present

## 2018-07-22 DIAGNOSIS — F3181 Bipolar II disorder: Secondary | ICD-10-CM | POA: Diagnosis not present

## 2018-08-04 DIAGNOSIS — F4310 Post-traumatic stress disorder, unspecified: Secondary | ICD-10-CM | POA: Diagnosis not present

## 2018-08-04 DIAGNOSIS — F3181 Bipolar II disorder: Secondary | ICD-10-CM | POA: Diagnosis not present

## 2018-08-11 DIAGNOSIS — F3181 Bipolar II disorder: Secondary | ICD-10-CM | POA: Diagnosis not present

## 2018-08-11 DIAGNOSIS — F4001 Agoraphobia with panic disorder: Secondary | ICD-10-CM | POA: Diagnosis not present

## 2018-08-11 DIAGNOSIS — F4310 Post-traumatic stress disorder, unspecified: Secondary | ICD-10-CM | POA: Diagnosis not present

## 2018-08-11 DIAGNOSIS — F1021 Alcohol dependence, in remission: Secondary | ICD-10-CM | POA: Diagnosis not present

## 2018-08-20 DIAGNOSIS — F4310 Post-traumatic stress disorder, unspecified: Secondary | ICD-10-CM | POA: Diagnosis not present

## 2018-08-20 DIAGNOSIS — F3181 Bipolar II disorder: Secondary | ICD-10-CM | POA: Diagnosis not present

## 2018-09-03 DIAGNOSIS — F3181 Bipolar II disorder: Secondary | ICD-10-CM | POA: Diagnosis not present

## 2018-09-03 DIAGNOSIS — F4310 Post-traumatic stress disorder, unspecified: Secondary | ICD-10-CM | POA: Diagnosis not present

## 2018-09-17 DIAGNOSIS — F3181 Bipolar II disorder: Secondary | ICD-10-CM | POA: Diagnosis not present

## 2018-09-17 DIAGNOSIS — F4310 Post-traumatic stress disorder, unspecified: Secondary | ICD-10-CM | POA: Diagnosis not present

## 2018-09-24 DIAGNOSIS — F1021 Alcohol dependence, in remission: Secondary | ICD-10-CM | POA: Diagnosis not present

## 2018-09-24 DIAGNOSIS — F4001 Agoraphobia with panic disorder: Secondary | ICD-10-CM | POA: Diagnosis not present

## 2018-09-24 DIAGNOSIS — I10 Essential (primary) hypertension: Secondary | ICD-10-CM | POA: Diagnosis not present

## 2018-09-24 DIAGNOSIS — F4310 Post-traumatic stress disorder, unspecified: Secondary | ICD-10-CM | POA: Diagnosis not present

## 2018-09-24 DIAGNOSIS — F3181 Bipolar II disorder: Secondary | ICD-10-CM | POA: Diagnosis not present

## 2018-10-05 DIAGNOSIS — F3181 Bipolar II disorder: Secondary | ICD-10-CM | POA: Diagnosis not present

## 2018-10-05 DIAGNOSIS — F4310 Post-traumatic stress disorder, unspecified: Secondary | ICD-10-CM | POA: Diagnosis not present

## 2018-10-22 DIAGNOSIS — F3181 Bipolar II disorder: Secondary | ICD-10-CM | POA: Diagnosis not present

## 2018-10-22 DIAGNOSIS — F4310 Post-traumatic stress disorder, unspecified: Secondary | ICD-10-CM | POA: Diagnosis not present

## 2018-10-28 ENCOUNTER — Other Ambulatory Visit: Payer: Self-pay

## 2018-10-28 ENCOUNTER — Emergency Department (INDEPENDENT_AMBULATORY_CARE_PROVIDER_SITE_OTHER): Payer: BLUE CROSS/BLUE SHIELD

## 2018-10-28 ENCOUNTER — Encounter: Payer: Self-pay | Admitting: *Deleted

## 2018-10-28 ENCOUNTER — Emergency Department (INDEPENDENT_AMBULATORY_CARE_PROVIDER_SITE_OTHER)
Admission: EM | Admit: 2018-10-28 | Discharge: 2018-10-28 | Disposition: A | Discharge status: Home / Self Care | Payer: BLUE CROSS/BLUE SHIELD | Source: Home / Self Care | Attending: Family Medicine | Admitting: Family Medicine

## 2018-10-28 DIAGNOSIS — S39012A Strain of muscle, fascia and tendon of lower back, initial encounter: Secondary | ICD-10-CM

## 2018-10-28 DIAGNOSIS — M545 Low back pain: Secondary | ICD-10-CM | POA: Diagnosis not present

## 2018-10-28 HISTORY — DX: Essential (primary) hypertension: I10

## 2018-10-28 MED ORDER — KETOROLAC TROMETHAMINE 60 MG/2ML IM SOLN: 60.0000 mg | Freq: Once | INTRAMUSCULAR | Status: DC

## 2018-10-28 MED ORDER — CYCLOBENZAPRINE HCL 10 MG PO TABS: 10.0000 mg | ORAL_TABLET | Freq: Three times a day (TID) | ORAL | 0 refills | Status: AC

## 2018-10-28 MED ORDER — KETOROLAC TROMETHAMINE 30 MG/ML IJ SOLN
30.0000 mg | Freq: Once | INTRAMUSCULAR | Status: AC
  Administered 2018-10-28: 30 mg via INTRAMUSCULAR

## 2018-10-28 NOTE — ED Provider Notes (Signed)
Stephen Myers CARE    CSN: [id number] Arrival date & time: 10/28/18  0903     History   Chief Complaint Chief Complaint  Patient presents with  . Back Pain    HPI Stephen Myers is a 57 y.o. male.   Three days ago while simply bending over to tie his shoes, patient felt a sudden sharp pain in his right lower back.  The pain does not radiate but has persisted.   He denies bowel or bladder dysfunction, and no saddle numbness.   The pain is worse when sitting and better when walking.  He has tried ibuprofen, lidocaine patches, Flexeril at bedtime, and "Icy Hot" without improvement.  The history is provided by the patient and the spouse.  Back Pain  Location:  Lumbar spine Quality:  Stabbing Radiates to:  Does not radiate Pain severity:  Moderate Pain is:  Same all the time Onset quality:  Sudden Duration:  4 days Timing:  Constant Progression:  Unchanged Chronicity:  Recurrent Context: not lifting heavy objects and not recent injury   Relieved by:  Nothing Worsened by:  Sitting Ineffective treatments: ibuprofen, lidocaine patch, Flexeril, and "Icy Hot" Associated symptoms: no abdominal pain, no abdominal swelling, no bladder incontinence, no bowel incontinence, no chest pain, no dysuria, no fever, no leg pain, no numbness, no perianal numbness, no tingling, no weakness and no weight loss   Risk factors: obesity     Past Medical History:  Diagnosis Date  . Anxiety   . Depression   . Hypertension   . Irritability and anger    symptoms at home and work    Patient Active Problem List   Diagnosis Date Noted  . 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 Plantar fasciitis, left 04/08/2016  . Suicidal ideation 07/07/2013  . Generalized anxiety disorder 01/12/2013  . Annual physical exam 10/12/2012  . Hyperlipidemia 09/23/2012  . Stuttering 09/09/2012  . 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 Post traumatic stress disorder 02/18/2012  . Depression,  major, recurrent, moderate ([facility]) 12/22/2011  . OCD (obsessive compulsive disorder)   . Hypertension 11/25/2010    Past Surgical History:  Procedure Laterality Date  . APPENDECTOMY    . COSMETIC SURGERY    . TONSILLECTOMY         Home Medications    Prior to Admission medications   Medication Sig Start Date End Date Taking? Authorizing Provider  buPROPion [facility] SR) 200 MG 12 hr tablet Take by mouth. 09/24/18 11/23/18 Yes [provider]  hydrochlorothiazide (HYDRODIURIL) 25 MG tablet Take by mouth. 07/26/18 07/26/19 Yes [provider]  lamoTRIgine (LAMICTAL) 150 MG tablet Take by mouth. 08/11/18  Yes [provider]  metoprolol succinate (TOPROL-XL) 25 MG 24 hr tablet Take by mouth. 07/01/18  Yes [provider]  mirtazapine (REMERON) 15 MG tablet Take by mouth. 10/06/18 12/05/18 Yes [provider]  aspirin 81 MG tablet Take 1 tablet (81 mg total) by mouth daily. 02/01/15   Silverio Decamp, MD  atorvastatin (LIPITOR) 40 MG tablet TAKE 1 TABLET (40 MG TOTAL) BY MOUTH DAILY. 07/17/17   Silverio Decamp, MD  cyclobenzaprine (FLEXERIL) 10 MG tablet Take 1 tablet (10 mg total) by mouth 3 (three) times daily. 10/28/18   Kandra Nicolas, MD  lithium carbonate (ESKALITH) 450 MG CR tablet Take 450 mg by mouth 2 (two) times daily.  03/27/16   [provider]  naltrexone (DEPADE) 50 MG tablet TAKE 1 TABLET BY  MOUTH EVERY DAY 04/20/14   Nena Polio T, PA-C  clomiPRAMINE (ANAFRANIL) 25 MG capsule Take 1 capsule (25 mg total) by mouth at bedtime. 01/02/12 01/14/12  Maryruth Eve, MD    Family History Family History  Problem Relation Age of Onset  . Depression Mother   . Cancer Mother        breast  . Bipolar disorder Father   . Stroke Father   . Depression Maternal Grandmother   . Bipolar disorder Daughter   . Drug abuse Daughter   . Depression Brother     Social History Social History   Tobacco Use  . Smoking  status: Former Smoker    Packs/day: 0.00    Years: 15.00    Pack years: 0.00    Types: Cigarettes    Last attempt to quit: 11/03/2000    Years since quitting: 17.9  . Smokeless tobacco: Never Used  Substance Use Topics  . Alcohol use: Yes    Alcohol/week: 0.0 standard drinks    Comment: rarely-2 drinks 12/2013  . Drug use: No    Comment: Caffeine: Coffee 2 cups per day. Caffeinated Beverages (green tea) 16 ounces per day.      Allergies   Benztropine; Trihexyphenidyl; and Amlodipine   Review of Systems Review of Systems  Constitutional: Positive for activity change. Negative for appetite change, chills, diaphoresis, fatigue, fever, unexpected weight change and weight loss.  Cardiovascular: Negative for chest pain.  Gastrointestinal: Negative for abdominal pain and bowel incontinence.  Genitourinary: Negative for bladder incontinence and dysuria.  Musculoskeletal: Positive for back pain.  Skin: Negative.   Neurological: Negative for tingling, weakness and numbness.  All other systems reviewed and are negative.    Physical Exam Triage Vital Signs ED Triage Vitals  Enc Vitals Group     BP 10/28/18 0924 (!) 145/86     Pulse Rate 10/28/18 0924 86     Resp 10/28/18 0924 18     Temp 10/28/18 0924 97.6 F (36.4 C)     Temp Source 10/28/18 0924 Oral     SpO2 10/28/18 0924 97 %     Weight 10/28/18 0925 255 lb (115.7 kg)     Height 10/28/18 0925 6\' 4"  (1.93 m)     Head Circumference --      Peak Flow --      Pain Score 10/28/18 0924 8     Pain Loc --      Pain Edu? --      Excl. in [place]? --    No data found.  Updated Vital Signs BP (!) 145/86 (BP Location: Right Arm)   Pulse 86   Temp 97.6 F (36.4 C) (Oral)   Resp 18   Ht 6\' 4"  (1.93 m)   Wt 115.7 kg   SpO2 97%   BMI 31.04 kg/m   Visual Acuity Right Eye Distance:   Left Eye Distance:   Bilateral Distance:    Right Eye Near:   Left Eye Near:    Bilateral Near:     Physical Exam Vitals signs and nursing  note reviewed.  Constitutional:      General: He is not in acute distress.    Appearance: Normal appearance. He is not ill-appearing or diaphoretic.  HENT:     Head: Normocephalic.     Right Ear: External ear normal.     Left Ear: External ear normal.     Nose: Nose normal.     Mouth/Throat:     Pharynx:  Oropharynx is clear.  Eyes:     Conjunctiva/sclera: Conjunctivae normal.     Pupils: Pupils are equal, round, and reactive to light.  Neck:     Musculoskeletal: Normal range of motion.  Cardiovascular:     Rate and Rhythm: Normal rate and regular rhythm.     Heart sounds: Normal heart sounds.  Pulmonary:     Breath sounds: Normal breath sounds.  Abdominal:     Tenderness: There is no abdominal tenderness.  Musculoskeletal:     Lumbar back: He exhibits decreased range of motion, tenderness and bony tenderness.       Back:     Right lower leg: No edema.     Left lower leg: No edema.     Comments: Back:  Can heel/toe walk and squat without difficulty.  Decreased forward flexion.  Tenderness in the midline at L3.  Straight leg raising test is negative.  Sitting knee extension test is negative.  Strength and sensation in the lower extremities is normal.  Patellar and achilles reflexes are normal   Skin:    General: Skin is warm and dry.     Findings: No rash.  Neurological:     General: No focal deficit present.     Mental Status: He is alert.      UC Treatments / Results  Labs (all labs ordered are listed, but only abnormal results are displayed) Labs Reviewed - No data to display  EKG None  Radiology Dg Lumbar Spine Complete  Result Date: 10/28/2018 CLINICAL DATA:  Low back pain 4 days EXAM: LUMBAR SPINE - COMPLETE 4+ VIEW COMPARISON:  11/23/2017 FINDINGS: Normal alignment. No fracture or pars defect. Mild disc degeneration throughout the lumbar spine most prominent L5-S1. No significant spurring. No focal bony lesion. IMPRESSION: Mild lumbar degenerative change.  No  acute abnormality. Electronically Signed   By: Franchot Gallo M.D.   On: 10/28/2018 10:24    Procedures Procedures (including critical care time)  Medications Ordered in UC Medications  ketorolac (TORADOL) 30 MG/ML injection 30 mg (30 mg Intramuscular Given 10/28/18 1003)    Initial Impression / Assessment and Plan / UC Course  I have reviewed the triage vital signs and the nursing notes.  Pertinent labs & imaging results that were available during my care of the patient were reviewed by me and considered in my medical decision making (see chart for details).    Administered Toradol 60mg  IM Begin Flexeril. Followup with Dr. Aundria Mems or Dr. Lynne Leader ([facility]) if not improving about two weeks.    Final Clinical Impressions(s) / UC Diagnoses   Final diagnoses:  Strain of lumbar region, initial encounter     Discharge Instructions     Apply ice pack for 20 to 30 minutes, 3 to 4 times daily  Continue until pain and swelling decrease.  May take Tylenol at bedtime as needed for pain.  Continue Ibuprofen 200mg , 4 tabs every 8 hours with food.   Begin back range of motion and stretching exercises as tolerated.    ED Prescriptions    Medication Sig Dispense Auth. Provider   cyclobenzaprine (FLEXERIL) 10 MG tablet Take 1 tablet (10 mg total) by mouth 3 (three) times daily. 30 tablet Kandra Nicolas, MD        Kandra Nicolas, MD 10/30/18 [phone]

## 2018-10-28 NOTE — Discharge Instructions (Addendum)
Apply ice pack for 20 to 30 minutes, 3 to 4 times daily  Continue until pain and swelling decrease.  May take Tylenol at bedtime as needed for pain.  Continue Ibuprofen 200mg , 4 tabs every 8 hours with food.   Begin back range of motion and stretching exercises as tolerated.

## 2018-10-28 NOTE — ED Triage Notes (Signed)
Pt c/o LBP x 3 days after bending over to tie his shoes. He has taken flexeril and IBF 800mg  last dose at 0800 today. He has also applied lidocaine patches.

## 2018-11-12 DIAGNOSIS — F319 Bipolar disorder, unspecified: Secondary | ICD-10-CM | POA: Diagnosis not present

## 2018-11-12 DIAGNOSIS — F4310 Post-traumatic stress disorder, unspecified: Secondary | ICD-10-CM | POA: Diagnosis not present

## 2018-11-15 DIAGNOSIS — Q809 Congenital ichthyosis, unspecified: Secondary | ICD-10-CM | POA: Diagnosis not present

## 2018-11-15 DIAGNOSIS — L814 Other melanin hyperpigmentation: Secondary | ICD-10-CM | POA: Diagnosis not present

## 2018-11-15 DIAGNOSIS — D225 Melanocytic nevi of trunk: Secondary | ICD-10-CM | POA: Diagnosis not present

## 2018-11-15 DIAGNOSIS — D2270 Melanocytic nevi of unspecified lower limb, including hip: Secondary | ICD-10-CM | POA: Diagnosis not present

## 2018-11-15 DIAGNOSIS — Z85828 Personal history of other malignant neoplasm of skin: Secondary | ICD-10-CM | POA: Diagnosis not present

## 2018-11-15 DIAGNOSIS — L821 Other seborrheic keratosis: Secondary | ICD-10-CM | POA: Diagnosis not present

## 2018-11-15 DIAGNOSIS — D2260 Melanocytic nevi of unspecified upper limb, including shoulder: Secondary | ICD-10-CM | POA: Diagnosis not present

## 2018-11-19 DIAGNOSIS — F3181 Bipolar II disorder: Secondary | ICD-10-CM | POA: Diagnosis not present

## 2018-11-19 DIAGNOSIS — F4001 Agoraphobia with panic disorder: Secondary | ICD-10-CM | POA: Diagnosis not present

## 2018-11-19 DIAGNOSIS — F4310 Post-traumatic stress disorder, unspecified: Secondary | ICD-10-CM | POA: Diagnosis not present

## 2018-11-19 DIAGNOSIS — F1021 Alcohol dependence, in remission: Secondary | ICD-10-CM | POA: Diagnosis not present

## 2018-11-22 DIAGNOSIS — F4310 Post-traumatic stress disorder, unspecified: Secondary | ICD-10-CM | POA: Diagnosis not present

## 2018-11-22 DIAGNOSIS — F319 Bipolar disorder, unspecified: Secondary | ICD-10-CM | POA: Diagnosis not present

## 2018-12-10 DIAGNOSIS — F4310 Post-traumatic stress disorder, unspecified: Secondary | ICD-10-CM | POA: Diagnosis not present

## 2018-12-10 DIAGNOSIS — F319 Bipolar disorder, unspecified: Secondary | ICD-10-CM | POA: Diagnosis not present

## 2018-12-17 DIAGNOSIS — F4001 Agoraphobia with panic disorder: Secondary | ICD-10-CM | POA: Diagnosis not present

## 2018-12-17 DIAGNOSIS — F314 Bipolar disorder, current episode depressed, severe, without psychotic features: Secondary | ICD-10-CM | POA: Diagnosis not present

## 2018-12-17 DIAGNOSIS — F1021 Alcohol dependence, in remission: Secondary | ICD-10-CM | POA: Diagnosis not present

## 2018-12-17 DIAGNOSIS — F4310 Post-traumatic stress disorder, unspecified: Secondary | ICD-10-CM | POA: Diagnosis not present

## 2018-12-23 DIAGNOSIS — R002 Palpitations: Secondary | ICD-10-CM | POA: Diagnosis not present

## 2018-12-24 DIAGNOSIS — F3181 Bipolar II disorder: Secondary | ICD-10-CM | POA: Diagnosis not present

## 2018-12-24 DIAGNOSIS — F4310 Post-traumatic stress disorder, unspecified: Secondary | ICD-10-CM | POA: Diagnosis not present

## 2019-01-07 DIAGNOSIS — F4310 Post-traumatic stress disorder, unspecified: Secondary | ICD-10-CM | POA: Diagnosis not present

## 2019-01-07 DIAGNOSIS — F3181 Bipolar II disorder: Secondary | ICD-10-CM | POA: Diagnosis not present

## 2019-01-11 DIAGNOSIS — F3181 Bipolar II disorder: Secondary | ICD-10-CM | POA: Diagnosis not present

## 2019-01-11 DIAGNOSIS — F1021 Alcohol dependence, in remission: Secondary | ICD-10-CM | POA: Diagnosis not present

## 2019-01-11 DIAGNOSIS — F429 Obsessive-compulsive disorder, unspecified: Secondary | ICD-10-CM | POA: Diagnosis not present

## 2019-01-11 DIAGNOSIS — F4001 Agoraphobia with panic disorder: Secondary | ICD-10-CM | POA: Diagnosis not present

## 2019-01-11 DIAGNOSIS — F4310 Post-traumatic stress disorder, unspecified: Secondary | ICD-10-CM | POA: Diagnosis not present

## 2019-01-16 IMAGING — DX DG LUMBAR SPINE COMPLETE 4+V
5 series · 5 of 5 positions shown · non-contrast
Comparison: 11/23/2017

CLINICAL DATA: Low back pain 4 days

EXAM:
LUMBAR SPINE - COMPLETE 4+ VIEW

[l-spine ap]
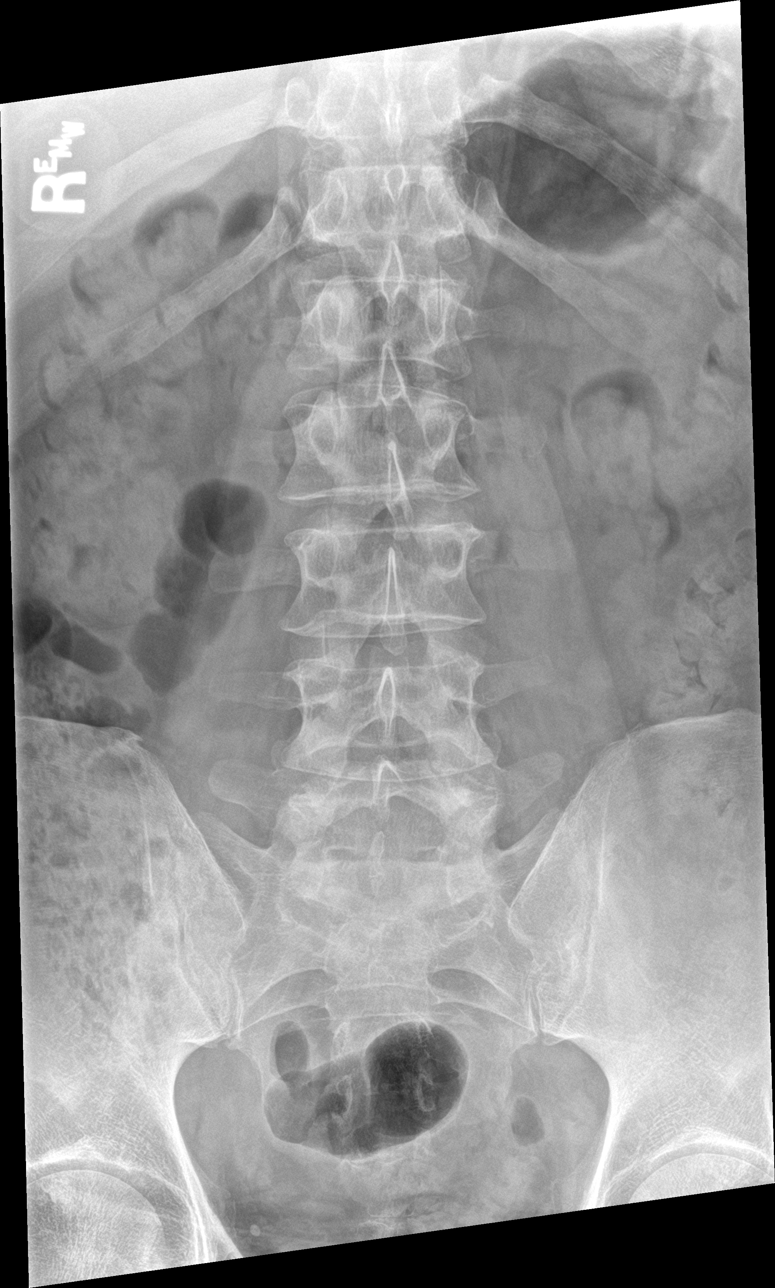

[l-spine obl (1 of 2)]
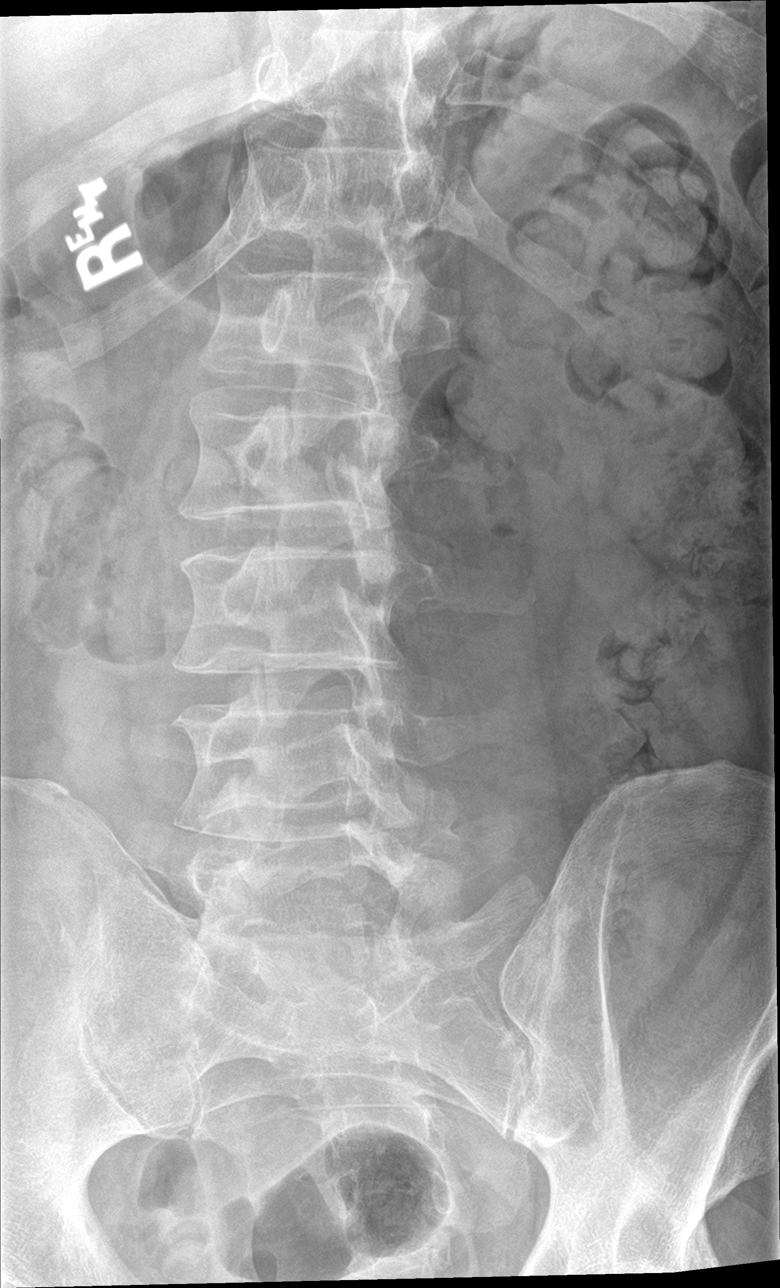

[l-spine obl (2 of 2)]
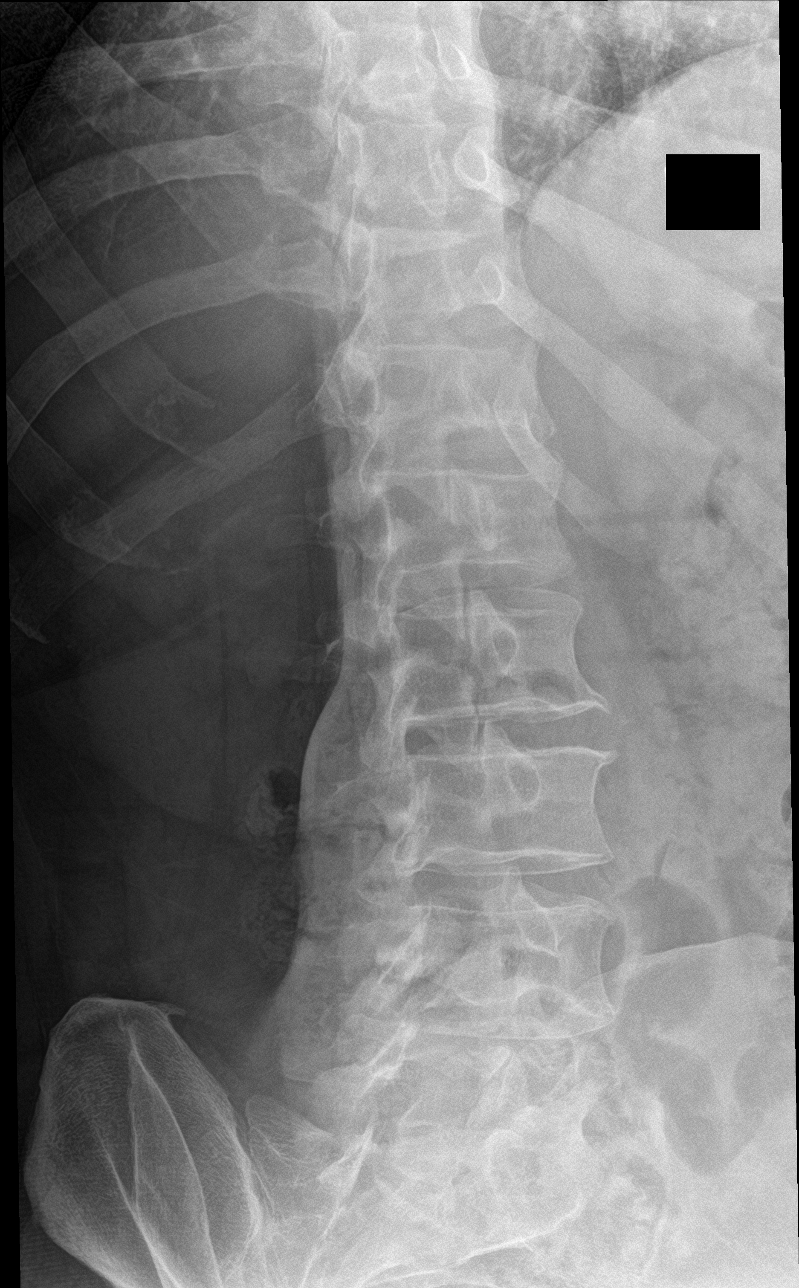

[l-spine lat]
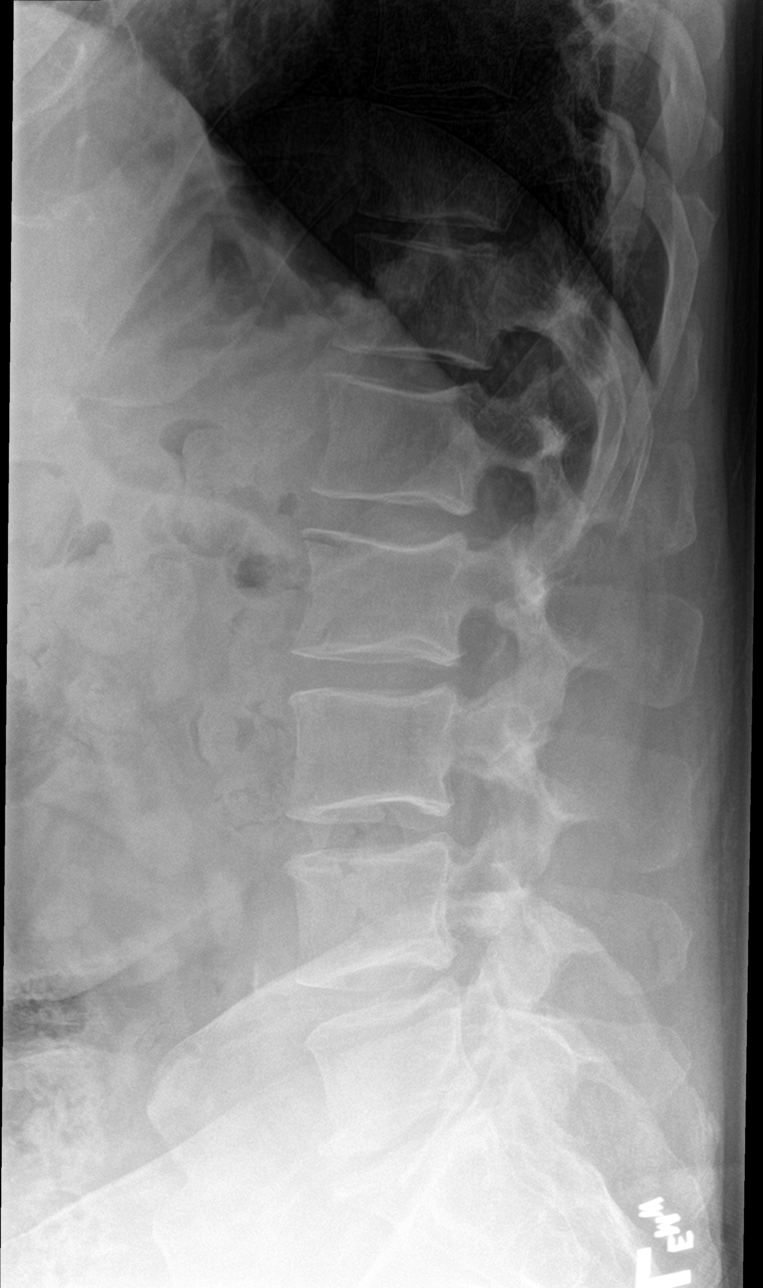

[l-spine spot]
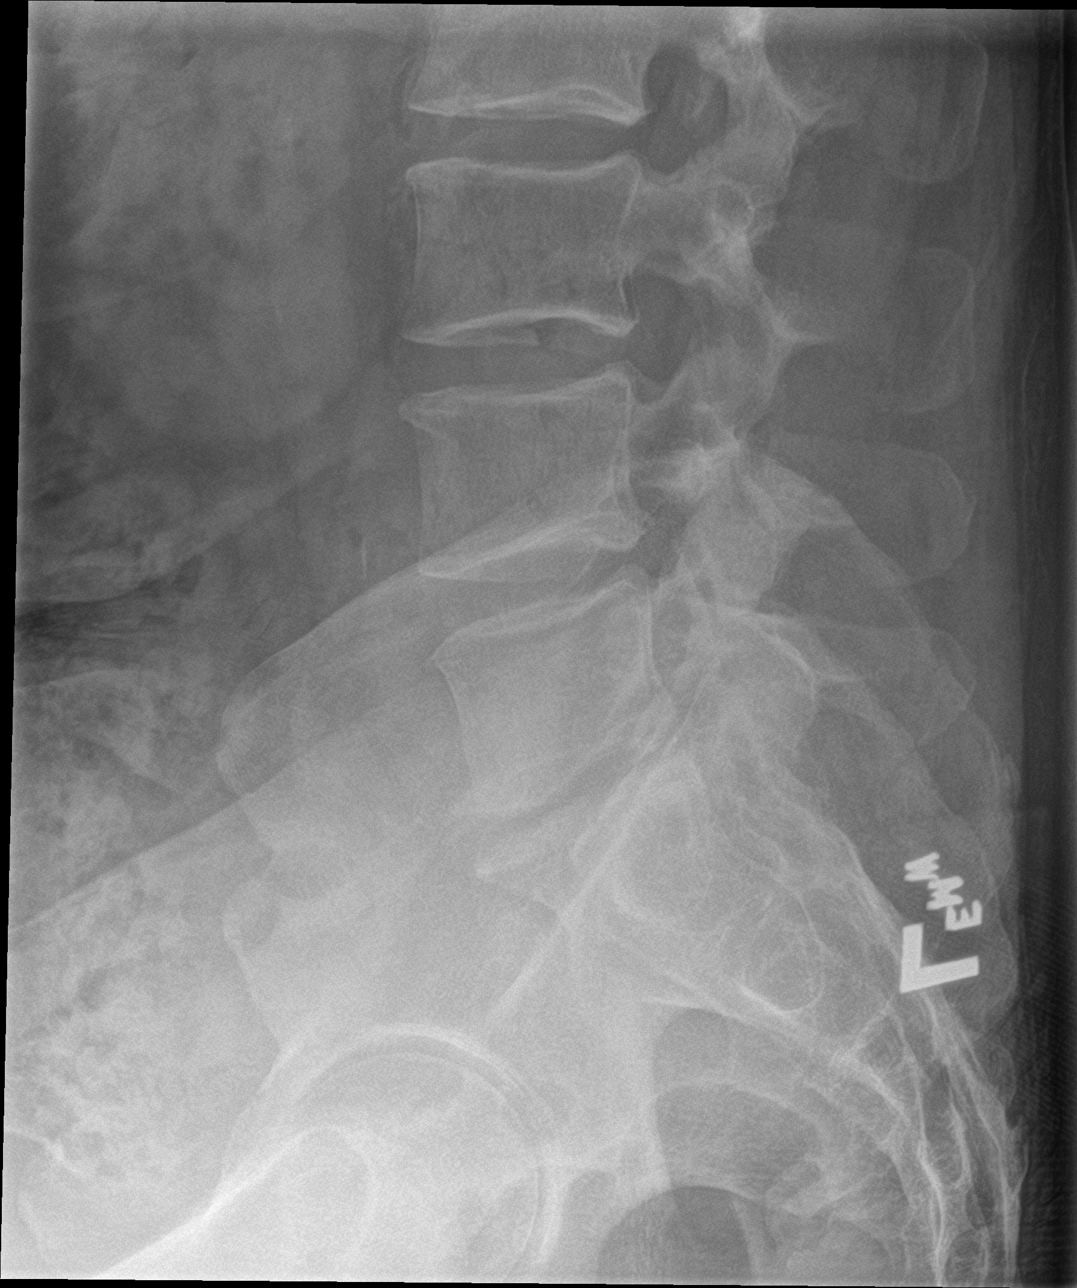

[5 of 5 positions shown; findings below may reference images not displayed]

FINDINGS: Normal alignment. No fracture or pars defect. Mild disc degeneration
throughout the lumbar spine most prominent L5-S1. No significant
spurring. No focal bony lesion.
IMPRESSION: Mild lumbar degenerative change.  No acute abnormality.

## 2019-01-31 DIAGNOSIS — F4001 Agoraphobia with panic disorder: Secondary | ICD-10-CM | POA: Diagnosis not present

## 2019-01-31 DIAGNOSIS — M25561 Pain in right knee: Secondary | ICD-10-CM | POA: Diagnosis not present

## 2019-01-31 DIAGNOSIS — E7800 Pure hypercholesterolemia, unspecified: Secondary | ICD-10-CM | POA: Diagnosis not present

## 2019-01-31 DIAGNOSIS — I10 Essential (primary) hypertension: Secondary | ICD-10-CM | POA: Diagnosis not present

## 2019-01-31 DIAGNOSIS — F4310 Post-traumatic stress disorder, unspecified: Secondary | ICD-10-CM | POA: Diagnosis not present

## 2019-01-31 DIAGNOSIS — F3181 Bipolar II disorder: Secondary | ICD-10-CM | POA: Diagnosis not present

## 2019-01-31 DIAGNOSIS — F429 Obsessive-compulsive disorder, unspecified: Secondary | ICD-10-CM | POA: Diagnosis not present

## 2022-12-18 ENCOUNTER — Ambulatory Visit (INDEPENDENT_AMBULATORY_CARE_PROVIDER_SITE_OTHER): Payer: Medicare Other | Admitting: Behavioral Health

## 2022-12-18 ENCOUNTER — Encounter: Payer: Self-pay | Admitting: Behavioral Health

## 2022-12-18 DIAGNOSIS — F4001 Agoraphobia with panic disorder: Secondary | ICD-10-CM | POA: Diagnosis not present

## 2022-12-18 DIAGNOSIS — F422 Mixed obsessional thoughts and acts: Secondary | ICD-10-CM | POA: Diagnosis not present

## 2022-12-18 DIAGNOSIS — F331 Major depressive disorder, recurrent, moderate: Secondary | ICD-10-CM

## 2022-12-18 DIAGNOSIS — F411 Generalized anxiety disorder: Secondary | ICD-10-CM | POA: Diagnosis not present
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
## 2022-12-19 NOTE — Progress Notes (Signed)
   Established Patient Office Visit  Subjective   Patient ID: Stephen Myers, male    DOB: 29-Jul-1961  Age: 62 y.o. MRN: [id number]  No chief complaint on file.   HPI    ROS    Objective:     There were no vitals taken for this visit.   Physical Exam   No results found for any visits on 12/18/22.    The 10-year ASCVD risk score (Arnett DK, et al., 2019) is: 15.1%    Assessment & Plan:   Problem List Items Addressed This Visit       Psychiatry Problems   OCD (obsessive compulsive disorder) (Chronic)   Depression, major, recurrent, moderate (HCC) (Chronic)   Panic disorder with agoraphobia   Other Visit Diagnoses     Anxiety, generalized    -  Primary       No follow-ups on file.    Sabas Sous, [facility]
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
## 2022-12-24 ENCOUNTER — Ambulatory Visit: Payer: Medicare Other | Admitting: Behavioral Health

## 2022-12-24 ENCOUNTER — Encounter: Payer: Self-pay | Admitting: Behavioral Health

## 2022-12-24 DIAGNOSIS — F3181 Bipolar II disorder: Secondary | ICD-10-CM

## 2022-12-24 DIAGNOSIS — F4310 Post-traumatic stress disorder, unspecified: Secondary | ICD-10-CM

## 2022-12-24 DIAGNOSIS — F411 Generalized anxiety disorder: Secondary | ICD-10-CM | POA: Diagnosis not present

## 2022-12-24 NOTE — Progress Notes (Unsigned)
                Sabas Sous, [facility]

## 2022-12-24 NOTE — Progress Notes (Addendum)
[place] Counselor/Therapist Progress Note  Patient ID: Stephen Myers, MRN: [id number],    Date: 12/24/2022  Time Spent: 60 minutes 4:00-5:00  This was a video visit.  The patient was at home and the therapist was at his home office.  Treatment Type: Individual Therapy  Reported Symptoms: anxiety, panic attacks Since our last session, the patient had an MRI but as of this session did not know the results.  He reports no significant physical health changes over the past week.  He still struggles with short-term memory and some physical stability.  He uses a golf club walking around the house to help steady himself.  He has a great sense of humor jokingly saying he never impacted off club well when he played golf so he is thankful he could use it for something.  He has current supports from his wife's son and daughter-in-law.  He takes pleasure in spending time with his grandchild.  He presents with mood stability feeling that he is on the best combination of medication that he has been on in a while.  He continues to walk although he is not able to walk as far as he used to.  As recently as a couple years ago he was walking 5 miles a day and estimates now that he walks about 2 miles in the morning and the most afternoons walks some for them.  He is proud of the fact that he has continue doing that consistently.  His weight has stayed consistent.  His sleep is fairly good.  He recognizes that he is still grieving his daughter from 2022.  She has a history of substance abuse and the patient and his wife did multiple things to help her and he knows that he was good to her and helped all that he could.  There was a period of time when he could not go to bed when asleep without seeing his daughter but that has gone much better.  We talked about the importance of continued breathing and feeling like he is feeling also to some ways that he has coped with her death.  He and his wife are  very supportive of each other.  We will continue to work on coping skills for helping with his anxiety and the progression of his Lewy body dementia diagnosis as well as processing his grief. 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  Speech/Language:  Normal Rate  Affect: Appropriate  Mood: normal  Thought process: normal  Thought content:   WNL  Sensory/Perceptual disturbances:   WNL  Orientation: oriented to person, place, and situation  Attention: Good  Concentration: Good  Memory: Impaired short term  Fund of knowledge:  Good  Insight:   Good  Judgment:  Good  Impulse Control: Good   Risk Assessment: Danger to Self:  No Self-injurious Behavior: No Danger to Others: No Duty to Warn:no Physical Aggression / Violence:No  Access to Firearms a concern: No  Gang Involvement:No  Treatment plan: Patient presents with diagnosis of bipolar 2 disorder as well as posttraumatic stress disorder and social anxiety disorder.  Primary symptoms related to anxiety include racing thoughts, pacing, anxiety about going outside and to public places.  Interventions include cognitive behavioral therapy and coping skills as well as cognitive reframing and thought stopping to help with racing thoughts.  Patient's medications are managed by Atha Starks. Patient's biological daughter  died in 2022 and there is residual grief.  He also was diagnosed with Lewy body dementia and is in the process of acceptance of the diagnosis.  Treatment will include helping the patient process is clear and related to his father's death as well as his symptoms of emotional reactions to progression of Lewy body dementia. Target date: June 18, 2023 Subjective:    Interventions: Cognitive Behavioral Therapy  Diagnosis:PTSD, Bi-Polar D/O  Plan: F/U/ appointments scheduled  Sabas Sous, [facility]

## 2022-12-25 ENCOUNTER — Encounter: Payer: Self-pay | Admitting: Behavioral Health

## 2023-01-01 ENCOUNTER — Ambulatory Visit: Payer: Medicare Other | Admitting: Behavioral Health

## 2023-01-01 ENCOUNTER — Encounter: Payer: Self-pay | Admitting: Behavioral Health

## 2023-01-01 DIAGNOSIS — F319 Bipolar disorder, unspecified: Secondary | ICD-10-CM | POA: Diagnosis not present

## 2023-01-01 DIAGNOSIS — F4310 Post-traumatic stress disorder, unspecified: Secondary | ICD-10-CM

## 2023-01-01 NOTE — Progress Notes (Addendum)
[place] Counselor/Therapist Progress Note  Patient ID: Stephen Myers, MRN: [id number],    Date: 01/01/2023  Time Spent: 60 minutes 4:00-5:00 via video.  The patient was at home and the therapist was in his home office.  Treatment Type: Individual Therapy  Reported Symptoms: anxiety, panic attacks The patient and his wife received from the MRI and they basically said something to the effect about speaking to him later about the memory loss.  The patient and his wife followed up quickly and found out that there was no stroke which was good news.  He said he PA had failed to look at the PET scan earlier did not seem to remember patient about his lewey body dementia diagnosis.  He is scheduled for an EEG sometime in the next couple of weeks.  He feels that he also is beginning to seek some benefit from physical therapy.  He was feeling somewhat more stable, a little stronger, and there is less pain.  We began to look more at the patient's grief over his daughter's death.  Her birthday is on 15-Jan-2025 and he and his wife are talking about how to commemorate that.  Last year they went out of town.  The anniversary of her death in May 31, 2023 th.  She died in 01/23/21.  He began to share more of his memories of her especially when she was a little girl.  She was a daddy's girl to fish and play golf with the patient.  We talked about the importance of sharing memories as a healthy part of the grieving process.  I encouraged him to share with pictures of her in the next session.  We looked at separating the difference between who she was versus the choices that she may and how the substance abuse impacted her decision-making.  We will continue to work on coping skills for helping with his anxiety and the progression of his Lewy body dementia diagnosis as well as processing his grief. Mental Status Exam: Appearance:  Well Groomed     Behavior: Appropriate  Motor: Pt. 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 instability  due to Lewey Body dementia  Speech/Language:  Normal Rate  Affect: Appropriate  Mood: normal  Thought process: normal  Thought content:   WNL  Sensory/Perceptual disturbances:   WNL  Orientation: oriented to person, place, and situation  Attention: Good  Concentration: Good  Memory: Impaired short term  Fund of knowledge:  Good  Insight:   Good  Judgment:  Good  Impulse Control: Good   Risk Assessment: Danger to Self:  No Self-injurious Behavior: No Danger to Others: No Duty to Warn:no Physical Aggression / Violence:No  Access to Firearms a concern: No  Gang Involvement:No  Treatment plan: Will employ cognitive behavioral therapy as well as grief and loss therapy for relief of anxiety and grief.  Goals of grief therapy or to have a healthier response to the loss of his daughter and less sadness as evidenced by patient report in therapy notes as well as to help him feel less overwhelmed by her loss.  Goals are to see a 50% reduction in grief symptoms over the next 6 months.  I will encourage the use of the patient telling his grief story about his daughter including encouraging him to bring pictures and memorabilia to help process his feelings including any feelings of guilt associated with her loss.  We will use cognitive behavioral therapy to help identify and change anxiety producing thoughts and behavior patterns so as  to improve his ability to better manage anxiety and stress, and manage thoughts and worrisome thinking contributing to anxiety.  We will also refresh and encourage dialectical behavior therapy distress tolerance and mindfulness skills with the intention of reducing anxiety by 25 to 30% over the next 6 months. Interventions: Cognitive Behavioral Therapy  Diagnosis:PTSD, Bi-Polar D/O  Plan: F/U/ appointments scheduled weekly.  Sabas Sous, [place] Andalyn Heckstall, [facility]

## 2023-01-09 ENCOUNTER — Ambulatory Visit (INDEPENDENT_AMBULATORY_CARE_PROVIDER_SITE_OTHER): Payer: Medicare Other | Admitting: Behavioral Health

## 2023-01-09 ENCOUNTER — Encounter: Payer: Self-pay | Admitting: Behavioral Health

## 2023-01-09 DIAGNOSIS — F411 Generalized anxiety disorder: Secondary | ICD-10-CM | POA: Diagnosis not present

## 2023-01-09 DIAGNOSIS — F4310 Post-traumatic stress disorder, unspecified: Secondary | ICD-10-CM | POA: Diagnosis not present

## 2023-01-09 DIAGNOSIS — F3181 Bipolar II disorder: Secondary | ICD-10-CM

## 2023-01-09 NOTE — Progress Notes (Signed)
[place] Counselor/Therapist Progress Note  Patient ID: Stephen Myers, MRN: [id number],    Date: 01/09/2023  Time Spent: 60 minutes 3:00 via video.  The patient was at home and the therapist was in his home office.  Treatment Type: Individual Therapy  Reported Symptoms: anxiety, panic attacks For the most part the patient has had a pretty good week.  He is looking forward to a visit from his brother, sister-in-law and niece.  He has always had a good relationship with all 3 of them but his niece and his daughter were very close and he has always had a good relationship with his niece also.  He is appreciative of the fact that those family members were always supportive of his daughter even when she was having a difficult time.  We did spend some more time processing some of the events leading up to when his daughter passed away.  Her birthday is next week and that has brought up some memories which we talked about.  He is focusing on the good times with her as they 2 had a very good relationship.  He talked about how she always wanted to make sure he was taking care of and that things that she did for him including buying him shirts and hats from his days in the [employer] and the university where he did his undergraduate degree.  He wears 1 of those shirts or hats every day.  He also talked about what he is in terms of the stages of grief and at times not being sure about that.  He knows that there is some anger at his daughter for some of the choices that she made as well as anger at God and all of those who had a negative influence on her.  He is struggling with his believes but does continue to listen to a certain pastor every morning.  We talked about being okay and his thoughts and feelings toward God and how that does not change God's feelings toward him.  He sees listing to the same pastor as a sign that he must want to look closer at his faith/belief system.  We talked briefly  what forgiveness feels like and how difficult at times that can be especially in times of grief.  We will continue to work on coping skills for helping with his anxiety and the progression of his Lewy body dementia diagnosis as well as processing his grief. 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  Speech/Language:  Normal Rate  Affect: Appropriate  Mood: normal  Thought process: normal  Thought content:   WNL  Sensory/Perceptual disturbances:   WNL  Orientation: oriented to person, place, and situation  Attention: Good  Concentration: Good  Memory: Impaired short term  Fund of knowledge:  Good  Insight:   Good  Judgment:  Good  Impulse Control: Good   Risk Assessment: Danger to Self:  No Self-injurious Behavior: No Danger to Others: No Duty to Warn:no Physical Aggression / Violence:No  Access to Firearms a concern: No  Gang Involvement:No  Treatment plan: Will employ cognitive behavioral therapy as well as grief and loss therapy for relief of anxiety and grief.  Goals of grief therapy or to have a healthier response to the loss of his daughter and less sadness as evidenced by patient report in therapy notes as well as to help him feel  less overwhelmed by her loss.  Goals are to see a 50% reduction in grief symptoms over the next 6 months.  I will encourage the use of the patient telling his grief story about his daughter including encouraging him to bring pictures and memorabilia to help process his feelings including any feelings of guilt associated with her loss.  We will use cognitive behavioral therapy to help identify and change anxiety producing thoughts and behavior patterns so as to improve his ability to better manage anxiety and stress, and manage thoughts and worrisome thinking contributing to anxiety.  We will also refresh and encourage dialectical behavior therapy distress  tolerance and mindfulness skills with the intention of reducing anxiety by 25 to 30% over the next 6 months. Interventions: Cognitive Behavioral Therapy  Diagnosis:PTSD, Bi-Polar D/O  Plan: F/U/ appointments scheduled weekly.  Stephen Myers, Stephen Myers, Stephen Myers, Stephen Myers

## 2023-01-16 ENCOUNTER — Encounter: Payer: Self-pay | Admitting: Behavioral Health

## 2023-01-16 ENCOUNTER — Ambulatory Visit: Payer: Medicare Other | Admitting: Behavioral Health

## 2023-01-16 DIAGNOSIS — F319 Bipolar disorder, unspecified: Secondary | ICD-10-CM | POA: Diagnosis not present
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
## 2023-01-23 ENCOUNTER — Encounter: Payer: Self-pay | Admitting: Behavioral Health

## 2023-01-23 ENCOUNTER — Ambulatory Visit (INDEPENDENT_AMBULATORY_CARE_PROVIDER_SITE_OTHER): Payer: Medicare Other | Admitting: Behavioral Health

## 2023-01-23 DIAGNOSIS — F4310 Post-traumatic stress disorder, unspecified: Secondary | ICD-10-CM

## 2023-01-23 DIAGNOSIS — F3181 Bipolar II disorder: Secondary | ICD-10-CM

## 2023-01-23 DIAGNOSIS — F411 Generalized anxiety disorder: Secondary | ICD-10-CM

## 2023-01-23 NOTE — Progress Notes (Signed)
[facility] Counselor/Therapist Progress Note  Patient ID: Stephen Myers, MRN: [id number],    Date: 01/23/2023  Time Spent: 60 minutes 3:00 via video.  The patient was at home and the therapist was in his home office.  Treatment Type: Individual Therapy  Reported Symptoms: anxiety, panic attacks The patient continues to see benefit from physical therapy feeling more stable and seeing better posture.  I observed that for the first time in a long time he smiled as he talked about being able to swing a golf club but not feeling like he might fall over.  He did ride with his son for a few holes while his son played recently.  His brother, sister-in-law and niece came to visit last weekend.  His niece and daughter were very close and they were able to share some stories and look through some pictures of his daughter and his niece together growing up and he said that was very therapeutic for him.  I encouraged him to do that as consistently as possible.  His niece is going through chemotherapy for breast cancer but he feels it is going well.  I encouraged him to spend as much time with his brother and brother's family as he can.  He also sees his son and daughter-in-law and grandson fairly often.  We did talk about his connection to his grandson thoughts and feelings that come along with that in relation to his daughter.  He knows how much his daughter would have loved time with her nephew.  The patient is a little hesitant about warming up to the grandson and does not want to feel that way.  We talked about some of the thoughts and feelings behind that in some ways that he could start to gradually mentally and emotionally work through that. We will continue to work on coping skills for helping with his anxiety and the progression of his Lewy body dementia diagnosis as well as processing his grief. Mental Status Exam: Appearance:  Well Groomed     Behavior: Appropriate  Motor: Pt. Reports  some instability due to Lewey Body dementia  Speech/Language:  Normal Rate  Affect: Appropriate  Mood: normal  Thought process: normal  Thought content:   WNL  Sensory/Perceptual disturbances:   WNL  Orientation: oriented to person, place, and situation  Attention: Good  Concentration: Good  Memory: Impaired short term  Fund of knowledge:  Good  Insight:   Good  Judgment:  Good  Impulse Control: Good   Risk Assessment: Danger to Self:  No Self-injurious Behavior: No Danger to Others: No Duty to Warn:no Physical Aggression / Violence:No  Access to Firearms a concern: No  Gang Involvement:No  Treatment plan: Will employ cognitive behavioral therapy as well as grief and loss therapy for relief of anxiety and grief.  Goals of grief therapy or to have a healthier response to the loss of his daughter and less sadness as evidenced by patient report in therapy notes as well as to help him feel less overwhelmed by her loss.  Goals are to see a 50% reduction in grief symptoms over the next 6 months.  I will encourage the use of the patient telling his grief story about his daughter including encouraging him to bring pictures and memorabilia to help process his feelings including any feelings of guilt associated with her loss.  We will use cognitive behavioral therapy to help identify and change anxiety producing thoughts and behavior patterns so as to improve his ability  to better manage anxiety and stress, and manage thoughts and worrisome thinking contributing to anxiety.  We will also refresh and encourage dialectical behavior therapy distress tolerance and mindfulness skills with the intention of reducing anxiety by 25 to 30% over the next 6 months. Interventions: Cognitive Behavioral Therapy  Diagnosis:PTSD, Bi-Polar D/O  Plan: F/U/ appointments scheduled weekly.  Sabas Sous, [place] Casimira Sutphin, [place] Chistine Dematteo,  [place] Yoan Sallade, [place] Pegah Segel, [facility]

## 2023-01-30 ENCOUNTER — Ambulatory Visit: Payer: BC Managed Care – PPO | Admitting: Behavioral Health

## 2023-02-06 ENCOUNTER — Encounter: Payer: Self-pay | Admitting: Behavioral Health

## 2023-02-06 ENCOUNTER — Ambulatory Visit (INDEPENDENT_AMBULATORY_CARE_PROVIDER_SITE_OTHER): Payer: Medicare Other | Admitting: Behavioral Health

## 2023-02-06 DIAGNOSIS — F4310 Post-traumatic stress disorder, unspecified: Secondary | ICD-10-CM | POA: Diagnosis not present

## 2023-02-06 DIAGNOSIS — F3181 Bipolar II disorder: Secondary | ICD-10-CM | POA: Diagnosis not present

## 2023-02-06 NOTE — Progress Notes (Signed)
[facility] Counselor/Therapist Progress Note  Patient ID: Stephen Myers, MRN: [id number],    Date: 02/06/2023  Time Spent: 60 minutes via video.  The patient was at home and the therapist was in his home office.  Treatment Type: Individual Therapy The patient and his family had a fairly quiet Easter.  His son and daughter-in-law and grandson did come over for dinner which he enjoyed.  He continues to progress in physical therapy send he feels that his balance is better.  He and his wife talked about the fact that when they lived in their home he used to maintain his and his neighbor's property which is about 4 acres and now he lives in a condo where the yard is taking care of.  His wife suggested that he ask his son about maintaining his yard.  He says he is hesitant to ask him to do anything for him but he would not mind taking care of his son chart.  He knows that he would have to have transportation and did not ask his son for a ride.  He said that if his wife initiated it and his son was open to it he would be glad to do that so I encouraged him to pursue that.  We continue to process the patient's grief.  He feels that he is grieving properly but that he and his wife grief differently.  She found some pictures on her daughter's phone and wanted to show the patient some of those pictures.  He told his wife and respectful way that he feels what is best for him is to only see those things that are positive for right now including pictures.  He respects the fact that his wife is grieving differently.  He finds himself in a lot of circumstances asking himself what his daughter would have done  in the situation.  That brings him comfort.  He is keeping himself as busy as he can.  He is listening to music now that he has not listened to in the past including several artist who have been around a while but are new to him including jelly roll and [employer].  I encouraged to  continue listening to music as well as looking for things such as mowing his son short to do.  His wife will be retiring probably in August of this year and that will open up some additional opportunities. He does still report moderate anxiety but no panic attacks.  He continues with physical therapy and sees the benefit of that.. He does contract for safety having no thoughts of hurting himself. Reported Symptoms: anxiety, panic attacks  We will continue to work on coping skills for helping with his anxiety and the progression of his Lewy body dementia diagnosis as well as processing his grief. 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  Speech/Language:  Normal Rate  Affect: Appropriate  Mood: normal  Thought process: normal  Thought content:   WNL  Sensory/Perceptual disturbances:   WNL  Orientation: oriented to person, place, and situation  Attention: Good  Concentration: Good  Memory: Impaired short term  Fund of knowledge:  Good  Insight:   Good  Judgment:  Good  Impulse Control: Good   Risk Assessment: Danger to Self:  No Self-injurious Behavior: No Danger to Others: No Duty to Warn:no Physical Aggression / Violence:No  Access to Firearms  a concern: No  Gang Involvement:No  Treatment plan: Will employ cognitive behavioral therapy as well as grief and loss therapy for relief of anxiety and grief.  Goals of grief therapy or to have a healthier response to the loss of his daughter and less sadness as evidenced by patient report in therapy notes as well as to help him feel less overwhelmed by her loss.  Goals are to see a 50% reduction in grief symptoms over the next 6 months.  I will encourage the use of the patient telling his grief story about his daughter including encouraging him to bring pictures and memorabilia to help process his feelings including any feelings of guilt associated  with her loss.  We will use cognitive behavioral therapy to help identify and change anxiety producing thoughts and behavior patterns so as to improve his ability to better manage anxiety and stress, and manage thoughts and worrisome thinking contributing to anxiety.  We will also refresh and encourage dialectical behavior therapy distress tolerance and mindfulness skills with the intention of reducing anxiety by 25 to 30% over the next 6 months. Progress: 30% Interventions: Cognitive Behavioral Therapy  Diagnosis:PTSD, Bi-Polar D/O  Plan: F/U/ appointments scheduled weekly.  French Anaraig M Nayshawn Mesta, [facility]                                                                             French Anaraig M Pristine Gladhill, [facility]

## 2023-02-13 ENCOUNTER — Ambulatory Visit (INDEPENDENT_AMBULATORY_CARE_PROVIDER_SITE_OTHER): Payer: Medicare Other | Admitting: Behavioral Health

## 2023-02-13 ENCOUNTER — Encounter: Payer: Self-pay | Admitting: Behavioral Health

## 2023-02-13 DIAGNOSIS — F4310 Post-traumatic stress disorder, unspecified: Secondary | ICD-10-CM

## 2023-02-13 NOTE — Progress Notes (Signed)
[facility] Counselor/Therapist Progress Note  Patient ID: Stephen Myers, MRN: [id number],    Date: 02/13/2023  Time Spent: 60 minutes via video.  The patient was at home and the therapist was in his home office.  Treatment Type: Individual Therapy The patient finally got results from the scans that took place 3 weeks ago.  He says he thinks it took calling and complaining to customer service to even get a response.  He said that he was told there was nothing else that showed up specifically but he has decided to pursue another practice because he feels he has not gotten adequate or fair response from this particular practice.  His previous neurologist before he moved to the area that he is in now either thought it was Lewy body dementia or Alzheimer's but was leaning more toward Lewy body dementia.  2 of the symptoms of Lewy body dementia are loss of short-term memory.  He says he can see that progressively getting worse.  He carries a notepad in his pocket now and has to write it down pretty quickly saying he will have a thought, started to look at about his phone before he can get it pulled up forgets what he was looking for.  Previously he said he came back to him within a short amount of time 40 to 50% of the time but says that is significantly less than that now.  He also says one of the side effects of Lewy body dementia is hallucinations.  He was frequently having visions of what he described as black or but type objects shooting across the wall or at his feet.  He still has those but not as frequently.  Before moving to the area he would see people although there were no one that he knew he could clearly tell they were people.  That too is less frequent but he says it is almost like it is a yellow color and shape somewhat like a person that he cannot recognize which is only about once a week or so.  He says it does not scare him but he understands even if it makes no sense  because it is part of the diagnosis.  He continues to practice self-care in terms of walking, listening to music, time with his family.  He showed me pictures of his biological father which he had never done before.  He recounted some stories some funny and some which although he left that said as a child or very difficult to deal with but now he can laugh at.  He recognizes that if his father had some mental health treatment things could have been differently but there were some things that were just being and how he treated the patient and his brothers.  He feels that he has come to his much of a place of peace with that as he can.  He does still report moderate anxiety but no panic attacks.  He continues with physical therapy and sees the benefit of that.. He does contract for safety having no thoughts of hurting himself. Reported Symptoms: anxiety, panic attacks  We will continue to work on coping skills for helping with his anxiety and the progression of his Lewy body dementia diagnosis as well as processing his grief. 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  Speech/Language:  Normal Rate  Affect: Appropriate  Mood: normal  Thought process: normal  Thought content:   WNL  Sensory/Perceptual disturbances:   WNL  Orientation: oriented to person, place, and situation  Attention: Good  Concentration: Good  Memory: Impaired short term  Fund of knowledge:  Good  Insight:   Good  Judgment:  Good  Impulse Control: Good   Risk Assessment: Danger to Self:  No Self-injurious Behavior: No Danger to Others: No Duty to Warn:no Physical Aggression / Violence:No  Access to Firearms a concern: No  Gang Involvement:No  Treatment plan: Will employ cognitive behavioral therapy as well as grief and loss therapy for relief of anxiety and grief.  Goals of grief therapy or to have a healthier response to  the loss of his daughter and less sadness as evidenced by patient report in therapy notes as well as to help him feel less overwhelmed by her loss.  Goals are to see a 50% reduction in grief symptoms over the next 6 months.  I will encourage the use of the patient telling his grief story about his daughter including encouraging him to bring pictures and memorabilia to help process his feelings including any feelings of guilt associated with her loss.  We will use cognitive behavioral therapy to help identify and change anxiety producing thoughts and behavior patterns so as to improve his ability to better manage anxiety and stress, and manage thoughts and worrisome thinking contributing to anxiety.  We will also refresh and encourage dialectical behavior therapy distress tolerance and mindfulness skills with the intention of reducing anxiety by 25 to 30% over the next 6 months. Progress: 30% Interventions: Cognitive Behavioral Therapy  Diagnosis:PTSD, Bi-Polar D/O  Plan: F/U/ appointments scheduled weekly.  [name], [facility]                                                                             [name], [facility]

## 2023-02-20 ENCOUNTER — Encounter: Payer: Self-pay | Admitting: Behavioral Health

## 2023-02-20 ENCOUNTER — Ambulatory Visit (INDEPENDENT_AMBULATORY_CARE_PROVIDER_SITE_OTHER): Payer: Medicare Other | Admitting: Behavioral Health

## 2023-02-20 DIAGNOSIS — F3181 Bipolar II disorder: Secondary | ICD-10-CM

## 2023-02-20 DIAGNOSIS — F4310 Post-traumatic stress disorder, unspecified: Secondary | ICD-10-CM

## 2023-02-20 NOTE — Progress Notes (Signed)
[facility] Counselor/Therapist Progress Note  Patient ID: Stephen Myers, MRN: [id number],    Date: 02/20/2023  Time Spent: 60 minutes via video.  The patient was at home and the therapist was in his home office.  Treatment Type: Individual Therapy The patient has started  fishing a little bit more.  There is a time within walking distance of his house on [place] golf [place] that he is allowed to fish out but he does so just before dusk when most golfers are already finished playing.  It is a catch and release time that he is having a lot of success and a lot of fun.  There is a tee bos close to that pond and he hit 4 or 5 golf balls with minimal to moderate success.  He may try it again today.  His wife is retiring in July and he is happy about that.  There is some anticipatory sadness as they are approaching the second anniversary of his daughter's death on 2023/06/03.  He still feels that he is grieving as well as he can. He is seeing more changes in his Lewy body dementia.  He noted in a previous session that he started to look something up on his phone and before he could he would forget what it was he was looking for.  He feels that is happening faster.  He feels that he is doing a fairly good job of accepting his diagnosis and the changes that come with that.  He knows that it has been hard on his wife.  He also notes that he has been a little more irritable and those that could be connected to the diagnosis.  [place] and that is giving him work pressure around his head and nasal cavity and nose that is contributing to his irritability increase also.  I emphasized the use of coping skills for tamping down anxiety and irritability but the patient is already doing a great job of that.  He continues to walk every day.  Fishing is giving him something to look forward to. He does still report moderate anxiety  but no panic attacks.  He continues with physical therapy and sees the benefit of that.. He does contract for safety having no thoughts of hurting himself. Reported Symptoms: anxiety, panic attacks  We will continue to work on coping skills for helping with his anxiety and the progression of his Lewy body dementia diagnosis as well as processing his grief. 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  Speech/Language:  Normal Rate  Affect: Appropriate  Mood: normal  Thought process: normal  Thought content:   WNL  Sensory/Perceptual disturbances:   WNL  Orientation: oriented to person, place, and situation  Attention: Good  Concentration: Good  Memory: Impaired short term  Fund of knowledge:  Good  Insight:   Good  Judgment:  Good  Impulse Control: Good   Risk Assessment: Danger to Self:  No Self-injurious Behavior: No Danger to Others: No Duty to Warn:no Physical Aggression / Violence:No  Access to Firearms a concern: No  Gang Involvement:No  Treatment plan: Will employ cognitive behavioral therapy as well as grief and loss therapy for relief of anxiety and grief.  Goals of grief therapy or to have a healthier response to the loss of his daughter and  less sadness as evidenced by patient report in therapy notes as well as to help him feel less overwhelmed by her loss.  Goals are to see a 50% reduction in grief symptoms over the next 6 months.  I will encourage the use of the patient telling his grief story about his daughter including encouraging him to bring pictures and memorabilia to help process his feelings including any feelings of guilt associated with her loss.  We will use cognitive behavioral therapy to help identify and change anxiety producing thoughts and behavior patterns so as to improve his ability to better manage anxiety and stress, and manage thoughts and worrisome thinking  contributing to anxiety.  We will also refresh and encourage dialectical behavior therapy distress tolerance and mindfulness skills with the intention of reducing anxiety by 25 to 30% over the next 6 months. Progress: 30% Interventions: Cognitive Behavioral Therapy  Diagnosis:PTSD, Bi-Polar D/O  Plan: F/U/ appointments scheduled weekly.  [name], [facility]                                                                                                            [name], [facility]               [name], [facility]

## 2023-02-27 ENCOUNTER — Ambulatory Visit: Payer: Medicare Other | Admitting: Behavioral Health

## 2023-03-06 ENCOUNTER — Encounter: Payer: Self-pay | Admitting: Behavioral Health

## 2023-03-06 ENCOUNTER — Ambulatory Visit (INDEPENDENT_AMBULATORY_CARE_PROVIDER_SITE_OTHER): Payer: Medicare Other | Admitting: Behavioral Health

## 2023-03-06 DIAGNOSIS — F4310 Post-traumatic stress disorder, unspecified: Secondary | ICD-10-CM | POA: Diagnosis not present

## 2023-03-06 DIAGNOSIS — F3181 Bipolar II disorder: Secondary | ICD-10-CM

## 2023-03-06 NOTE — Progress Notes (Signed)
[facility] Counselor/Therapist Progress Note  Patient ID: Stephen Myers, MRN: [id number],    Date: 03/06/2023  Time Spent: 60 minutes via video.  The patient was at home and the therapist was in his home office.  Treatment Type: Individual Therapy The patient had some sort of viral infection last weekend and said he felt miserable.  He still sounded fairly congested and feels he is getting better.  He is finding that going to a small pond off of a golf course near his home is very therapeutic for him and even though he does not catch very much he finds solace in being out there.  He did actually have a fairly big past looked but it came off the line but he was not disappointed.  He says that he is in a time in his life where he is learning to accept things.  He does say that he has had some thoughts of self-harm but has found that conversations with his daughter bring him peace.  He knows that she would want him to live life fully and not have self-harm thoughts and he is at peace with that saying he no longer is having those thoughts.  He acknowledges still having some guilt that we talked about how many different things he tried to help his daughter including getting her some independence.  Not long before she died she stayed with him for 5 days where she would have stayed a night or 2 and a wrestles with guilt thinking that she might having her own weight and asking to come back home.  He knows that she chose to be involved with substance abuse and everything he and his wife tried did not help but he still struggles somewhat with the guilt of asking her to leave home.  I reminded him that even living in his house she continued with some of the same behavior patterns and that he and his wife are good parents to her and loved on her very much. He continues to use fishing as well as listening to new music which he describes is very soothing for him.  He recommended listening to a song  by struggle Ginnings with Stephen Myers that Stephen Myers called [name].  Stephen Myers also has a song entitled been through hell which he encouraged me to listen to what I will do. He does contract for safety having no thoughts of hurting himself. Reported Symptoms: anxiety, panic attacks  We will continue to work on coping skills for helping with his anxiety and the progression of his Lewy body dementia diagnosis as well as processing his grief. 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  Speech/Language:  Normal Rate  Affect: Appropriate  Mood: normal  Thought process: normal  Thought content:   WNL  Sensory/Perceptual disturbances:   WNL  Orientation: oriented to person, place, and situation  Attention: Good  Concentration: Good  Memory: Impaired short term  Fund of knowledge:  Good  Insight:   Good  Judgment:  Good  Impulse Control: Good   Risk Assessment: Danger to Self:  No Self-injurious Behavior: No Danger to Others: No Duty to Warn:no Physical Aggression / Violence:No  Access to Firearms a concern: No  Gang Involvement:No  Treatment plan: Will employ cognitive behavioral therapy as well as grief and loss therapy for relief of anxiety  and grief.  Goals of grief therapy or to have a healthier response to the loss of his daughter and less sadness as evidenced by patient report in therapy notes as well as to help him feel less overwhelmed by her loss.  Goals are to see a 50% reduction in grief symptoms over the next 6 months.  I will encourage the use of the patient telling his grief story about his daughter including encouraging him to bring pictures and memorabilia to help process his feelings including any feelings of guilt associated with her loss.  We will use cognitive behavioral therapy to help identify and change anxiety producing thoughts and behavior  patterns so as to improve his ability to better manage anxiety and stress, and manage thoughts and worrisome thinking contributing to anxiety.  We will also refresh and encourage dialectical behavior therapy distress tolerance and mindfulness skills with the intention of reducing anxiety by 25 to 30% over the next 6 months. Progress: 30% Interventions: Cognitive Behavioral Therapy  Diagnosis:PTSD, Bi-Polar D/O  Plan: F/U/ appointments scheduled weekly.  Stephen Myers, [facility]                                                                                                            Stephen Myers, [facility]               Stephen Myers, [facility]               Stephen Myers, [facility]

## 2023-03-13 ENCOUNTER — Ambulatory Visit (INDEPENDENT_AMBULATORY_CARE_PROVIDER_SITE_OTHER): Payer: Medicare Other | Admitting: Behavioral Health

## 2023-03-13 ENCOUNTER — Encounter: Payer: Self-pay | Admitting: Behavioral Health

## 2023-03-13 DIAGNOSIS — F3181 Bipolar II disorder: Secondary | ICD-10-CM | POA: Diagnosis not present

## 2023-03-13 NOTE — Progress Notes (Signed)
[facility] Counselor/Therapist Progress Note  Patient ID: Stephen Myers, MRN: [id number],    Date: 03/13/2023  Time Spent: 60 minutes via video.  The patient was at home and the therapist was in his home office.  Treatment Type: Individual Therapy The patient sees that his memory at least short-term is getting worse on a regular basis.  He says that it does not help set him but it is frustrating in the moment when he cannot remember something that he used to remember easily.  He understands that is the progression of Lewy body dementia but is frustrating.  We also looked at a couple of other things which are creating frustration and irritability for the patient and we looked at different ways to address those directly or indirectly and the importance of how those would be addressed in terms of how they would be received.  We talked about the importance of using his coping skills as well as mindfulness in knowing where his thoughts are as they pertain to emotional responses.  The patient will be in this area visiting family next week so I scheduled an in office visit with him on Wednesday, May 15 at noon and canceled the one on Friday, May 17 at 2:00.  Reported Symptoms: anxiety, panic attacks  We will continue to work on coping skills for helping with his anxiety and the progression of his Lewy body dementia diagnosis as well as processing his grief. 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  Speech/Language:  Normal Rate  Affect: Appropriate  Mood: normal  Thought process: normal  Thought content:   WNL  Sensory/Perceptual disturbances:   WNL  Orientation: oriented to person, place, and situation  Attention: Good  Concentration: Good  Memory: Impaired short term  Fund of knowledge:  Good  Insight:   Good  Judgment:  Good  Impulse Control: Good   Risk  Assessment: Danger to Self:  No Self-injurious Behavior: No Danger to Others: No Duty to Warn:no Physical Aggression / Violence:No  Access to Firearms a concern: No  Gang Involvement:No  Treatment plan: Will employ cognitive behavioral therapy as well as grief and loss therapy for relief of anxiety and grief.  Goals of grief therapy or to have a healthier response to the loss of his daughter and less sadness as evidenced by patient report in therapy notes as well as to help him feel less overwhelmed by her loss.  Goals are to see a 50% reduction in grief symptoms over the next 6 months.  I will encourage the use of the patient telling his grief story about his daughter including encouraging him to bring pictures and memorabilia to help process his feelings including any feelings of guilt associated with her loss.  We will use cognitive behavioral therapy to help identify and change anxiety producing thoughts and behavior patterns so as to improve his ability to better manage anxiety and stress, and manage thoughts and worrisome thinking contributing to anxiety.  We will also refresh and encourage dialectical behavior therapy distress tolerance and mindfulness skills with the intention of reducing anxiety by 25 to 30% over the next 6 months. Progress: 30% Interventions: Cognitive Behavioral Therapy  Diagnosis:PTSD, Bi-Polar D/O  Plan: F/U/ appointments scheduled weekly.  [name], [facility]  [name], [facility]               [name], [facility]               [name], [facility]               [name], [facility]

## 2023-03-18 ENCOUNTER — Ambulatory Visit (INDEPENDENT_AMBULATORY_CARE_PROVIDER_SITE_OTHER): Payer: Medicare Other | Admitting: Behavioral Health

## 2023-03-18 ENCOUNTER — Encounter: Payer: Self-pay | Admitting: Behavioral Health

## 2023-03-18 DIAGNOSIS — F3181 Bipolar II disorder: Secondary | ICD-10-CM

## 2023-03-18 DIAGNOSIS — F4310 Post-traumatic stress disorder, unspecified: Secondary | ICD-10-CM

## 2023-03-18 NOTE — Progress Notes (Signed)
[facility] Counselor/Therapist Progress Note  Patient ID: Stephen Myers, MRN: [id number],    Date: 03/18/2023  Time Spent: 60 minutes spent in person with the patient. Treatment Type: Individual Therapy Reported Symptoms: anxiety, panic attacks The patient continues to struggle with short-term memory.  He is changing some of his providers in the area as he did not get results back after several weeks with minimal response.  He continues to be medication compliant.  He did spend some time with his son, daughter-in-law and grandson and says he is becoming more comfortable with his grandson.  He is also spending some time with his brothers over the next few days which he says is good for his anxiety.  His wife will be retiring next month and so they will be spending more time together.  We did talk more about his daughter and some of his feelings associated with her death.  He continues to grieve in a healthy way.  Lately he has been looking at some of her art work and recognizing the talent that he already knew she had but appreciates even more so now.  There have also been a couple of former coworkers who have reached out to connect to him which she has appreciated.  I encouraged continued use of his coping skills for anxiety reduction.  He continues to walk daily, fish at a private pond where no one else is when he can especially on Sundays when his wife is with him.  We will continue to work on coping skills for helping with his anxiety and the progression of his Lewy body dementia diagnosis as well as processing his grief. 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  Speech/Language:  Normal Rate  Affect: Appropriate  Mood: normal  Thought process: normal  Thought content:   WNL  Sensory/Perceptual disturbances:   WNL  Orientation: oriented to person, place, and situation   Attention: Good  Concentration: Good  Memory: Impaired short term  Fund of knowledge:  Good  Insight:   Good  Judgment:  Good  Impulse Control: Good   Risk Assessment: Danger to Self:  No Self-injurious Behavior: No Danger to Others: No Duty to Warn:no Physical Aggression / Violence:No  Access to Firearms a concern: No  Gang Involvement:No  Treatment plan: Will employ cognitive behavioral therapy as well as grief and loss therapy for relief of anxiety and grief.  Goals of grief therapy or to have a healthier response to the loss of his daughter and less sadness as evidenced by patient report in therapy notes as well as to help him feel less overwhelmed by her loss.  Goals are to see a 50% reduction in grief symptoms over the next 6 months.  I will encourage the use of the patient telling his grief story about his daughter including encouraging him to bring pictures and memorabilia to help process his feelings including any feelings of guilt associated with her loss.  We will use cognitive behavioral therapy to help identify and change anxiety producing thoughts and behavior patterns so as to improve his ability to better manage anxiety and stress, and manage thoughts and worrisome thinking contributing to anxiety.  We will also refresh and encourage dialectical behavior therapy distress tolerance and mindfulness skills with the intention of reducing anxiety by 50% over the next 6 months. Progress: 30% Interventions: Cognitive Behavioral Therapy  Diagnosis:PTSD, Bi-Polar  D/O  Plan: F/U/ appointments scheduled weekly.  [name], [facility]

## 2023-03-20 ENCOUNTER — Ambulatory Visit: Payer: BC Managed Care – PPO | Admitting: Behavioral Health

## 2023-03-27 ENCOUNTER — Ambulatory Visit: Payer: BC Managed Care – PPO | Admitting: Behavioral Health

## 2023-04-03 ENCOUNTER — Ambulatory Visit (INDEPENDENT_AMBULATORY_CARE_PROVIDER_SITE_OTHER): Payer: Medicare Other | Admitting: Behavioral Health

## 2023-04-03 ENCOUNTER — Encounter: Payer: Self-pay | Admitting: Behavioral Health

## 2023-04-03 DIAGNOSIS — F4310 Post-traumatic stress disorder, unspecified: Secondary | ICD-10-CM

## 2023-04-03 DIAGNOSIS — F3181 Bipolar II disorder: Secondary | ICD-10-CM | POA: Diagnosis not present

## 2023-04-03 NOTE — Progress Notes (Signed)
[facility] Counselor/Therapist Progress Note  Patient ID: Stephen Myers, MRN: [id number],    Date: 04/03/2023  Time Spent: 60 minutes spent via video session. The pt. Was at ome and this therapist was in his home office. Treatment Type: Individual Therapy Reported Symptoms: anxiety, panic attacks The patient reports that he has been struggling with some of the same thoughts continually over the past few weeks.  Some of them are thoughts that he has dealt with in the past and has not necessarily gotten resolution or answers for but he says they continue to be something he thinks about and they have affected his quality of life and his sleep recently.  We looked at the root cause behind some of these thoughts and why they are being continued and the anxiety that they are creating.  We looked at ways to try to get these thoughts answered in some way and the best way to present those questions but we also looked at what the patient does with those answers if they are not what he expects or if he does not get a clear answer at all.  We talked about the importance of coping with some of the feelings associated with these thoughts but also how to cognitively reframe or challenge these thoughts if necessary. We will continue to work on coping skills for helping with his anxiety and the progression of his Lewy body dementia diagnosis as well as processing his grief. 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  Speech/Language:  Normal Rate  Affect: Appropriate  Mood: normal  Thought process: normal  Thought content:   WNL  Sensory/Perceptual disturbances:   WNL  Orientation: oriented to person, place, and situation  Attention: Good  Concentration: Good  Memory: Impaired short term  Fund of knowledge:  Good  Insight:   Good  Judgment:  Good  Impulse Control: Good   Risk  Assessment: Danger to Self:  No Self-injurious Behavior: No Danger to Others: No Duty to Warn:no Physical Aggression / Violence:No  Access to Firearms a concern: No  Gang Involvement:No  Treatment plan: Will employ cognitive behavioral therapy as well as grief and loss therapy for relief of anxiety and grief.  Goals of grief therapy or to have a healthier response to the loss of his daughter and less sadness as evidenced by patient report in therapy notes as well as to help him feel less overwhelmed by her loss.  Goals are to see a 50% reduction in grief symptoms over the next 6 months.  I will encourage the use of the patient telling his grief story about his daughter including encouraging him to bring pictures and memorabilia to help process his feelings including any feelings of guilt associated with her loss.  We will use cognitive behavioral therapy to help identify and change anxiety producing thoughts and behavior patterns so as to improve his ability to better manage anxiety and stress, and manage thoughts and worrisome thinking contributing to anxiety.  We will also refresh and encourage dialectical behavior therapy distress tolerance and mindfulness skills with the intention of reducing anxiety by 50% over the next 6 months. Progress: 30% Interventions: Cognitive Behavioral Therapy  Diagnosis:PTSD, Bi-Polar D/O  Plan: F/U/ appointments scheduled weekly.  [name], [facility]  [facility] Counselor/Therapist Progress Note  Patient ID: Stephen Myers, MRN: [id number],    Date: 04/03/2023  Time Spent: 60 minutes spent in person with the patient. Treatment Type: Individual Therapy Reported Symptoms: anxiety, panic attacks The patient continues to struggle with short-term  memory.  He is changing some of his providers in the area as he did not get results back after several weeks with minimal response.  He continues to be medication compliant.  He did spend some time with his son, daughter-in-law and grandson and says he is becoming more comfortable with his grandson.  He is also spending some time with his brothers over the next few days which he says is good for his anxiety.  His wife will be retiring next month and so they will be spending more time together.  We did talk more about his daughter and some of his feelings associated with her death.  He continues to grieve in a healthy way.  Lately he has been looking at some of her art work and recognizing the talent that he already knew she had but appreciates even more so now.  There have also been a couple of former coworkers who have reached out to connect to him which she has appreciated.  I encouraged continued use of his coping skills for anxiety reduction.  He continues to walk daily, fish at a private pond where no one else is when he can especially on Sundays when his wife is with him.  We will continue to work on coping skills for helping with his anxiety and the progression of his Lewy body dementia diagnosis as well as processing his grief. 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  Speech/Language:  Normal Rate  Affect: Appropriate  Mood: normal  Thought process: normal  Thought content:   WNL  Sensory/Perceptual disturbances:   WNL  Orientation: oriented to person, place, and situation  Attention: Good  Concentration: Good  Memory: Impaired short term  Fund of knowledge:  Good  Insight:   Good  Judgment:  Good  Impulse Control: Good   Risk Assessment: Danger to Self:  No Self-injurious Behavior: No Danger to Others: No Duty to Warn:no Physical Aggression / Violence:No  Access to Firearms a concern:  No  Gang Involvement:No  Treatment plan: Will employ cognitive behavioral therapy as well as grief and loss therapy for relief of anxiety and grief.  Goals of grief therapy or to have a healthier response to the loss of his daughter and less sadness as evidenced by patient report in therapy notes as well as to help him feel less overwhelmed by her loss.  Goals are to see a 50% reduction in grief symptoms over the next 6 months.  I will encourage the use of the patient telling his grief story about his daughter including encouraging him to bring pictures and memorabilia to help process his feelings including any feelings of guilt associated with her loss.  We will use cognitive behavioral therapy to help identify and change anxiety producing thoughts and behavior patterns so as to improve his ability to better manage anxiety and stress, and manage thoughts and worrisome thinking contributing to anxiety.  We will also refresh and encourage dialectical behavior therapy distress tolerance and mindfulness skills with the intention of reducing anxiety by 50% over the next 6 months. Progress: 30% Interventions: Cognitive Behavioral Therapy  Diagnosis:PTSD, Bi-Polar  D/O  Plan: F/U/ appointments scheduled weekly.  [name], [facility]                                                                                                     [facility] Counselor/Therapist Progress Note  Patient ID: Stephen Myers, MRN: [id number],    Date: 04/03/2023  Time Spent: 60 minutes spent in person with the patient. Treatment Type: Individual Therapy Reported Symptoms: anxiety, panic attacks The patient continues to struggle with short-term memory.  He is changing some of his providers in the area as he did not get results back after several weeks with minimal response.  He continues to be medication  compliant.  He did spend some time with his son, daughter-in-law and grandson and says he is becoming more comfortable with his grandson.  He is also spending some time with his brothers over the next few days which he says is good for his anxiety.  His wife will be retiring next month and so they will be spending more time together.  We did talk more about his daughter and some of his feelings associated with her death.  He continues to grieve in a healthy way.  Lately he has been looking at some of her art work and recognizing the talent that he already knew she had but appreciates even more so now.  There have also been a couple of former coworkers who have reached out to connect to him which she has appreciated.  I encouraged continued use of his coping skills for anxiety reduction.  He continues to walk daily, fish at a private pond where no one else is when he can especially on Sundays when his wife is with him.  We will continue to work on coping skills for helping with his anxiety and the progression of his Lewy body dementia diagnosis as well as processing his grief. 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  Speech/Language:  Normal Rate  Affect: Appropriate  Mood: normal  Thought process: normal  Thought content:   WNL  Sensory/Perceptual disturbances:   WNL  Orientation: oriented to person, place, and situation  Attention: Good  Concentration: Good  Memory: Impaired short term  Fund of knowledge:  Good  Insight:   Good  Judgment:  Good  Impulse Control: Good   Risk Assessment: Danger to Self:  No Self-injurious Behavior: No Danger to Others: No Duty to Warn:no Physical Aggression / Violence:No  Access to Firearms a concern: No  Gang Involvement:No  Treatment plan: Will employ cognitive behavioral therapy as well as grief and loss therapy for relief of anxiety and grief.  Goals of  grief therapy or to have a healthier response to the loss of his daughter and less sadness as evidenced by patient report in therapy notes as well as to help him feel less overwhelmed by her loss.  Goals are to see a 50% reduction in grief symptoms over the next 6 months.  I  will encourage the use of the patient telling his grief story about his daughter including encouraging him to bring pictures and memorabilia to help process his feelings including any feelings of guilt associated with her loss.  We will use cognitive behavioral therapy to help identify and change anxiety producing thoughts and behavior patterns so as to improve his ability to better manage anxiety and stress, and manage thoughts and worrisome thinking contributing to anxiety.  We will also refresh and encourage dialectical behavior therapy distress tolerance and mindfulness skills with the intention of reducing anxiety by 50% over the next 6 months. Progress: 30% Interventions: Cognitive Behavioral Therapy  Diagnosis:PTSD, Bi-Polar D/O  Plan: F/U/ appointments scheduled weekly.  [name], [facility]                                                                                                                  [name], [facility]

## 2023-04-10 ENCOUNTER — Ambulatory Visit (INDEPENDENT_AMBULATORY_CARE_PROVIDER_SITE_OTHER): Payer: Medicare Other | Admitting: Behavioral Health

## 2023-04-10 DIAGNOSIS — F3181 Bipolar II disorder: Secondary | ICD-10-CM | POA: Diagnosis not present

## 2023-04-10 DIAGNOSIS — F4310 Post-traumatic stress disorder, unspecified: Secondary | ICD-10-CM | POA: Diagnosis not present

## 2023-04-11 NOTE — Progress Notes (Signed)
[facility] Counselor/Therapist Progress Note  Patient ID: Stephen Myers, MRN: [id number],    Date: 04/11/2023  Time Spent: 60 minutes spent via video session. The pt. Was at ome and this therapist was in his home office. Treatment Type: Individual Therapy Reported Symptoms: anxiety, panic attacks The patient reports that he has been struggling with some of the same thoughts continually over the past few weeks.  Some of them are thoughts that he has dealt with in the past and has not necessarily gotten resolution or answers for but he says they continue to be something he thinks about and they have affected his quality of life and his sleep recently.  We looked at the root cause behind some of these thoughts and why they are being continued and the anxiety that they are creating.  We looked at ways to try to get these thoughts answered in some way and the best way to present those questions but we also looked at what the patient does with those answers if they are not what he expects or if he does not get a clear answer at all.  We talked about the importance of coping with some of the feelings associated with these thoughts but also how to cognitively reframe or challenge these thoughts if necessary. We will continue to work on coping skills for helping with his anxiety and the progression of his Lewy body dementia diagnosis as well as processing his grief. 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  Speech/Language:  Normal Rate  Affect: Appropriate  Mood: normal  Thought process: normal  Thought content:   WNL  Sensory/Perceptual disturbances:   WNL  Orientation: oriented to person, place, and situation  Attention: Good  Concentration: Good  Memory: Impaired short term  Fund of knowledge:  Good  Insight:   Good  Judgment:  Good  Impulse Control: Good   Risk  Assessment: Danger to Self:  No Self-injurious Behavior: No Danger to Others: No Duty to Warn:no Physical Aggression / Violence:No  Access to Firearms a concern: No  Gang Involvement:No  Treatment plan: Will employ cognitive behavioral therapy as well as grief and loss therapy for relief of anxiety and grief.  Goals of grief therapy or to have a healthier response to the loss of his daughter and less sadness as evidenced by patient report in therapy notes as well as to help him feel less overwhelmed by her loss.  Goals are to see a 50% reduction in grief symptoms over the next 6 months.  I will encourage the use of the patient telling his grief story about his daughter including encouraging him to bring pictures and memorabilia to help process his feelings including any feelings of guilt associated with her loss.  We will use cognitive behavioral therapy to help identify and change anxiety producing thoughts and behavior patterns so as to improve his ability to better manage anxiety and stress, and manage thoughts and worrisome thinking contributing to anxiety.  We will also refresh and encourage dialectical behavior therapy distress tolerance and mindfulness skills with the intention of reducing anxiety by 50% over the next 6 months. Progress: 30% Interventions: Cognitive Behavioral Therapy  Diagnosis:PTSD, Bi-Polar D/O  Plan: F/U/ appointments scheduled weekly.  [name], [facility]  [facility] Counselor/Therapist Progress Note  Patient ID: Stephen Myers, MRN: [id number],    Date: 04/11/2023  Time Spent: 60 minutes spent in person with the patient. Treatment Type: Individual Therapy Reported Symptoms: anxiety, panic attacks The patient continues to struggle with short-term  memory.  He is changing some of his providers in the area as he did not get results back after several weeks with minimal response.  He continues to be medication compliant.  He did spend some time with his son, daughter-in-law and grandson and says he is becoming more comfortable with his grandson.  He is also spending some time with his brothers over the next few days which he says is good for his anxiety.  His wife will be retiring next month and so they will be spending more time together.  We did talk more about his daughter and some of his feelings associated with her death.  He continues to grieve in a healthy way.  Lately he has been looking at some of her art work and recognizing the talent that he already knew she had but appreciates even more so now.  There have also been a couple of former coworkers who have reached out to connect to him which she has appreciated.  I encouraged continued use of his coping skills for anxiety reduction.  He continues to walk daily, fish at a private pond where no one else is when he can especially on Sundays when his wife is with him.  We will continue to work on coping skills for helping with his anxiety and the progression of his Lewy body dementia diagnosis as well as processing his grief. 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  Speech/Language:  Normal Rate  Affect: Appropriate  Mood: normal  Thought process: normal  Thought content:   WNL  Sensory/Perceptual disturbances:   WNL  Orientation: oriented to person, place, and situation  Attention: Good  Concentration: Good  Memory: Impaired short term  Fund of knowledge:  Good  Insight:   Good  Judgment:  Good  Impulse Control: Good   Risk Assessment: Danger to Self:  No Self-injurious Behavior: No Danger to Others: No Duty to Warn:no Physical Aggression / Violence:No  Access to Firearms a concern:  No  Gang Involvement:No  Treatment plan: Will employ cognitive behavioral therapy as well as grief and loss therapy for relief of anxiety and grief.  Goals of grief therapy or to have a healthier response to the loss of his daughter and less sadness as evidenced by patient report in therapy notes as well as to help him feel less overwhelmed by her loss.  Goals are to see a 50% reduction in grief symptoms over the next 6 months.  I will encourage the use of the patient telling his grief story about his daughter including encouraging him to bring pictures and memorabilia to help process his feelings including any feelings of guilt associated with her loss.  We will use cognitive behavioral therapy to help identify and change anxiety producing thoughts and behavior patterns so as to improve his ability to better manage anxiety and stress, and manage thoughts and worrisome thinking contributing to anxiety.  We will also refresh and encourage dialectical behavior therapy distress tolerance and mindfulness skills with the intention of reducing anxiety by 50% over the next 6 months. Progress: 30% Interventions: Cognitive Behavioral Therapy  Diagnosis:PTSD, Bi-Polar  D/O  Plan: F/U/ appointments scheduled weekly.  [name], [facility]                                                                                                     [facility] Counselor/Therapist Progress Note  Patient ID: Stephen Myers, MRN: [id number],    Date: 04/11/2023  Time Spent: 60 minutes spent in person with the patient. Treatment Type: Individual Therapy Reported Symptoms: anxiety, panic attacks The patient continues to struggle with short-term memory.  He is changing some of his providers in the area as he did not get results back after several weeks with minimal response.  He continues to be medication  compliant.  He did spend some time with his son, daughter-in-law and grandson and says he is becoming more comfortable with his grandson.  He is also spending some time with his brothers over the next few days which he says is good for his anxiety.  His wife will be retiring next month and so they will be spending more time together.  We did talk more about his daughter and some of his feelings associated with her death.  He continues to grieve in a healthy way.  Lately he has been looking at some of her art work and recognizing the talent that he already knew she had but appreciates even more so now.  There have also been a couple of former coworkers who have reached out to connect to him which she has appreciated.  I encouraged continued use of his coping skills for anxiety reduction.  He continues to walk daily, fish at a private pond where no one else is when he can especially on Sundays when his wife is with him.  We will continue to work on coping skills for helping with his anxiety and the progression of his Lewy body dementia diagnosis as well as processing his grief. 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  Speech/Language:  Normal Rate  Affect: Appropriate  Mood: normal  Thought process: normal  Thought content:   WNL  Sensory/Perceptual disturbances:   WNL  Orientation: oriented to person, place, and situation  Attention: Good  Concentration: Good  Memory: Impaired short term  Fund of knowledge:  Good  Insight:   Good  Judgment:  Good  Impulse Control: Good   Risk Assessment: Danger to Self:  No Self-injurious Behavior: No Danger to Others: No Duty to Warn:no Physical Aggression / Violence:No  Access to Firearms a concern: No  Gang Involvement:No  Treatment plan: Will employ cognitive behavioral therapy as well as grief and loss therapy for relief of anxiety and grief.  Goals of  grief therapy or to have a healthier response to the loss of his daughter and less sadness as evidenced by patient report in therapy notes as well as to help him feel less overwhelmed by her loss.  Goals are to see a 50% reduction in grief symptoms over the next 6 months.  I  will encourage the use of the patient telling his grief story about his daughter including encouraging him to bring pictures and memorabilia to help process his feelings including any feelings of guilt associated with her loss.  We will use cognitive behavioral therapy to help identify and change anxiety producing thoughts and behavior patterns so as to improve his ability to better manage anxiety and stress, and manage thoughts and worrisome thinking contributing to anxiety.  We will also refresh and encourage dialectical behavior therapy distress tolerance and mindfulness skills with the intention of reducing anxiety by 50% over the next 6 months. Progress: 30% Interventions: Cognitive Behavioral Therapy  Diagnosis:PTSD, Bi-Polar D/O  Plan: F/U/ appointments scheduled weekly.  [name], [facility]                                                                                                                  [name], [facility]  [facility] Counselor/Therapist Progress Note  Patient ID: Stephen Myers, MRN: [id number],    Date: 04/11/2023  Time Spent: 60 minutes spent via video session. The pt. Was at ome and this therapist was in his home office. Treatment Type: Individual Therapy Reported Symptoms: anxiety, panic attacks The patient reports that he has been struggling with some of the same thoughts continually over the past few weeks.  Some of them are thoughts that he has dealt with in the past and has not necessarily gotten resolution or answers for but he says they continue to be something  he thinks about and they have affected his quality of life and his sleep recently.  We looked at the root cause behind some of these thoughts and why they are being continued and the anxiety that they are creating.  We looked at ways to try to get these thoughts answered in some way and the best way to present those questions but we also looked at what the patient does with those answers if they are not what he expects or if he does not get a clear answer at all.  We talked about the importance of coping with some of the feelings associated with these thoughts but also how to cognitively reframe or challenge these thoughts if necessary. We will continue to work on coping skills for helping with his anxiety and the progression of his Lewy body dementia diagnosis as well as processing his grief. 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  Speech/Language:  Normal Rate  Affect: Appropriate  Mood: normal  Thought process: normal  Thought content:   WNL  Sensory/Perceptual disturbances:   WNL  Orientation: oriented to person, place, and situation  Attention: Good  Concentration: Good  Memory: Impaired short term  Fund of knowledge:  Good  Insight:   Good  Judgment:  Good  Impulse Control: Good   Risk Assessment: Danger to Self:  No Self-injurious Behavior: No Danger to Others: No Duty  to Warn:no Physical Aggression / Violence:No  Access to Firearms a concern: No  Gang Involvement:No  Treatment plan: Will employ cognitive behavioral therapy as well as grief and loss therapy for relief of anxiety and grief.  Goals of grief therapy or to have a healthier response to the loss of his daughter and less sadness as evidenced by patient report in therapy notes as well as to help him feel less overwhelmed by her loss.  Goals are to see a 50% reduction in grief symptoms over the next 6 months.  I will encourage  the use of the patient telling his grief story about his daughter including encouraging him to bring pictures and memorabilia to help process his feelings including any feelings of guilt associated with her loss.  We will use cognitive behavioral therapy to help identify and change anxiety producing thoughts and behavior patterns so as to improve his ability to better manage anxiety and stress, and manage thoughts and worrisome thinking contributing to anxiety.  We will also refresh and encourage dialectical behavior therapy distress tolerance and mindfulness skills with the intention of reducing anxiety by 50% over the next 6 months. Progress: 30% Interventions: Cognitive Behavioral Therapy  Diagnosis:PTSD, Bi-Polar D/O  Plan: F/U/ appointments scheduled weekly.  [name], [facility]                                                                                                     [facility] Counselor/Therapist Progress Note  Patient ID: Stephen Myers, MRN: [id number],    Date: 04/11/2023  Time Spent: 60 minutes spent in person with the patient. Treatment Type: Individual Therapy Reported Symptoms: anxiety, panic attacks The patient continues to struggle with short-term memory.  He is changing some of his providers in the area as he did not get results back after several weeks with minimal response.  He continues to be medication compliant.  He did spend some time with his son, daughter-in-law and grandson and says he is becoming more comfortable with his grandson.  He is also spending some time with his brothers over the next few days which he says is good for his anxiety.  His wife will be retiring next month and so they will be spending more time together.  We did talk more about his daughter and some of his feelings associated with her death.  He continues to grieve in a  healthy way.  Lately he has been looking at some of her art work and recognizing the talent that he already knew she had but appreciates even more so now.  There have also been a couple of former coworkers who have reached out to connect to him which she has appreciated.  I encouraged continued use of his coping skills for anxiety reduction.  He continues to walk daily, fish at a private pond where no one else is when he can especially on Sundays when his wife is with him.  We will continue to work on coping skills for helping with his anxiety and the progression of his Lewy body dementia diagnosis  as well as processing his grief. 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  Speech/Language:  Normal Rate  Affect: Appropriate  Mood: normal  Thought process: normal  Thought content:   WNL  Sensory/Perceptual disturbances:   WNL  Orientation: oriented to person, place, and situation  Attention: Good  Concentration: Good  Memory: Impaired short term  Fund of knowledge:  Good  Insight:   Good  Judgment:  Good  Impulse Control: Good   Risk Assessment: Danger to Self:  No Self-injurious Behavior: No Danger to Others: No Duty to Warn:no Physical Aggression / Violence:No  Access to Firearms a concern: No  Gang Involvement:No  Treatment plan: Will employ cognitive behavioral therapy as well as grief and loss therapy for relief of anxiety and grief.  Goals of grief therapy or to have a healthier response to the loss of his daughter and less sadness as evidenced by patient report in therapy notes as well as to help him feel less overwhelmed by her loss.  Goals are to see a 50% reduction in grief symptoms over the next 6 months.  I will encourage the use of the patient telling his grief story about his daughter including encouraging him to bring pictures and memorabilia to help process his feelings including  any feelings of guilt associated with her loss.  We will use cognitive behavioral therapy to help identify and change anxiety producing thoughts and behavior patterns so as to improve his ability to better manage anxiety and stress, and manage thoughts and worrisome thinking contributing to anxiety.  We will also refresh and encourage dialectical behavior therapy distress tolerance and mindfulness skills with the intention of reducing anxiety by 50% over the next 6 months. Progress: 30% Interventions: Cognitive Behavioral Therapy  Diagnosis:PTSD, Bi-Polar D/O  Plan: F/U/ appointments scheduled weekly.  [name], [facility]                                                                                                     [facility] Counselor/Therapist Progress Note  Patient ID: Stephen Myers, MRN: [id number],    Date: 04/11/2023  Time Spent: 58 minutes spent via video session with the patient.  The patient was at home and this therapist was in his home office. Treatment Type: Individual Therapy Reported Symptoms: anxiety, panic attacks The second anniversary of his daughter's death is coming up on 14-Aug-2024and the patient has been thinking a lot about her.  He feels that he is grieving well but he recognizes that he and his wife are grieving differently.  He has accepted the cause of death and conditions but his wife continues to reach out to Hospital doctor looking for some official documentation.  He understands that as a part of her grieving.  They also will be going out of town during that week in part based on tradition where he had his wife go out of town on their birthday says they are 2 days apart but  at times out okay and that they can get away at the time of the second anniversary of the daughter's death.  We talked about the patient's grieving process and what  that looks like.  He does appear to be grieving in a healthy way knowing he will never forget her allowing himself to feel some strong emotions when they come to the present but also recognizing that he and his wife did a great job with her and that he had his daughter had a great relationship.  We talked about some of the funny things that they used to talk about including his daughter joking with him about always wearing the same color pair pants and a blue shirt to work every day. We will continue to work on coping skills for helping with his anxiety and the progression of his Lewy body dementia diagnosis as well as processing his grief. 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  Speech/Language:  Normal Rate  Affect: Appropriate  Mood: normal  Thought process: normal  Thought content:   WNL  Sensory/Perceptual disturbances:   WNL  Orientation: oriented to person, place, and situation  Attention: Good  Concentration: Good  Memory: Impaired short term  Fund of knowledge:  Good  Insight:   Good  Judgment:  Good  Impulse Control: Good   Risk Assessment: Danger to Self:  No Self-injurious Behavior: No Danger to Others: No Duty to Warn:no Physical Aggression / Violence:No  Access to Firearms a concern: No  Gang Involvement:No  Treatment plan: Will employ cognitive behavioral therapy as well as grief and loss therapy for relief of anxiety and grief.  Goals of grief therapy or to have a healthier response to the loss of his daughter and less sadness as evidenced by patient report in therapy notes as well as to help him feel less overwhelmed by her loss.  Goals are to see a 50% reduction in grief symptoms over the next 6 months.  I will encourage the use of the patient telling his grief story about his daughter including encouraging him to bring pictures and memorabilia to help process his feelings  including any feelings of guilt associated with her loss.  We will use cognitive behavioral therapy to help identify and change anxiety producing thoughts and behavior patterns so as to improve his ability to better manage anxiety and stress, and manage thoughts and worrisome thinking contributing to anxiety.  We will also refresh and encourage dialectical behavior therapy distress tolerance and mindfulness skills with the intention of reducing anxiety by 50% over the next 6 months. Progress: 30% Interventions: Cognitive Behavioral Therapy  Diagnosis:PTSD, Bi-Polar D/O  Plan: F/U/ appointments scheduled weekly.  [name], [facility]                                                                                                                  [name], [facility]  Rushie Brazel M Avinash Maltos, LCMHC 

## 2023-04-16 ENCOUNTER — Encounter: Payer: Self-pay | Admitting: Behavioral Health

## 2023-04-17 ENCOUNTER — Ambulatory Visit (INDEPENDENT_AMBULATORY_CARE_PROVIDER_SITE_OTHER): Payer: Medicare Other | Admitting: Behavioral Health

## 2023-04-17 ENCOUNTER — Encounter: Payer: Self-pay | Admitting: Behavioral Health

## 2023-04-17 DIAGNOSIS — F3181 Bipolar II disorder: Secondary | ICD-10-CM

## 2023-04-17 DIAGNOSIS — F4310 Post-traumatic stress disorder, unspecified: Secondary | ICD-10-CM

## 2023-04-17 NOTE — Progress Notes (Signed)
[facility] Counselor/Therapist Progress Note  Patient ID: Stephen Myers, MRN: [id number],    Date: 04/10/23  Time Spent: 60 minutes spent via video session. The pt. was at home and this therapist was in his home office.  The patient is aware of the limitations of a telehealth video visit. Treatment Type: Individual Therapy Reported Symptoms: anxiety, panic attacks The patient reports that he has been struggling with some of the same thoughts continually over the past few weeks.  Some of them are thoughts that he has dealt with in the past and has not necessarily gotten resolution or answers for but he says they continue to be something he thinks about and they have affected his quality of life and his sleep recently.  We looked at the root cause behind some of these thoughts and why they are being continued and the anxiety that they are creating.  We looked at ways to try to get these thoughts answered in some way and the best way to present those questions but we also looked at what the patient does with those answers if they are not what he expects or if he does not get a clear answer at all.  We talked about the importance of coping with some of the feelings associated with these thoughts but also how to cognitively reframe or challenge these thoughts if necessary. We will continue to work on coping skills for helping with his anxiety and the progression of his Lewy body dementia diagnosis as well as processing his grief. 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  Speech/Language:  Normal Rate  Affect: Appropriate  Mood: normal  Thought process: normal  Thought content:   WNL  Sensory/Perceptual disturbances:   WNL  Orientation: oriented to person, place, and situation  Attention: Good  Concentration: Good  Memory: Impaired short term  Fund of knowledge:  Good  Insight:    Good  Judgment:  Good  Impulse Control: Good   Risk Assessment: Danger to Self:  No Self-injurious Behavior: No Danger to Others: No Duty to Warn:no Physical Aggression / Violence:No  Access to Firearms a concern: No  Gang Involvement:No  Treatment plan: Will employ cognitive behavioral therapy as well as grief and loss therapy for relief of anxiety and grief.  Goals of grief therapy or to have a healthier response to the loss of his daughter and less sadness as evidenced by patient report in therapy notes as well as to help him feel less overwhelmed by her loss.  Goals are to see a 50% reduction in grief symptoms over the next 6 months.  I will encourage the use of the patient telling his grief story about his daughter including encouraging him to bring pictures and memorabilia to help process his feelings including any feelings of guilt associated with her loss.  We will use cognitive behavioral therapy to help identify and change anxiety producing thoughts and behavior patterns so as to improve his ability to better manage anxiety and stress, and manage thoughts and worrisome thinking contributing to anxiety.  We will also refresh and encourage dialectical behavior therapy distress tolerance and mindfulness skills with the intention of reducing anxiety by 50% over the next 6 months. Progress: 30% Interventions: Cognitive Behavioral Therapy  Diagnosis:PTSD, Bi-Polar D/O  Plan: F/U/ appointments scheduled weekly.  [name], [facility]  [facility] Counselor/Therapist Progress Note  Patient ID: Stephen Myers, MRN: [id number],    Date: 04/17/2023  Time Spent: 60 minutes spent in person with the patient. Treatment Type: Individual Therapy Reported Symptoms: anxiety, panic  attacks The patient continues to struggle with short-term memory.  He is changing some of his providers in the area as he did not get results back after several weeks with minimal response.  He continues to be medication compliant.  He did spend some time with his son, daughter-in-law and grandson and says he is becoming more comfortable with his grandson.  He is also spending some time with his brothers over the next few days which he says is good for his anxiety.  His wife will be retiring next month and so they will be spending more time together.  We did talk more about his daughter and some of his feelings associated with her death.  He continues to grieve in a healthy way.  Lately he has been looking at some of her art work and recognizing the talent that he already knew she had but appreciates even more so now.  There have also been a couple of former coworkers who have reached out to connect to him which she has appreciated.  I encouraged continued use of his coping skills for anxiety reduction.  He continues to walk daily, fish at a private pond where no one else is when he can especially on Sundays when his wife is with him.  We will continue to work on coping skills for helping with his anxiety and the progression of his Lewy body dementia diagnosis as well as processing his grief. 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  Speech/Language:  Normal Rate  Affect: Appropriate  Mood: normal  Thought process: normal  Thought content:   WNL  Sensory/Perceptual disturbances:   WNL  Orientation: oriented to person, place, and situation  Attention: Good  Concentration: Good  Memory: Impaired short term  Fund of knowledge:  Good  Insight:   Good  Judgment:  Good  Impulse Control: Good   Risk Assessment: Danger to Self:  No Self-injurious Behavior: No Danger to Others: No Duty to  Warn:no Physical Aggression / Violence:No  Access to Firearms a concern: No  Gang Involvement:No  Treatment plan: Will employ cognitive behavioral therapy as well as grief and loss therapy for relief of anxiety and grief.  Goals of grief therapy or to have a healthier response to the loss of his daughter and less sadness as evidenced by patient report in therapy notes as well as to help him feel less overwhelmed by her loss.  Goals are to see a 50% reduction in grief symptoms over the next 6 months.  I will encourage the use of the patient telling his grief story about his daughter including encouraging him to bring pictures and memorabilia to help process his feelings including any feelings of guilt associated with her loss.  We will use cognitive behavioral therapy to help identify and change anxiety producing thoughts and behavior patterns so as to improve his ability to better manage anxiety and stress, and manage thoughts and worrisome thinking contributing to anxiety.  We will also refresh and encourage dialectical behavior therapy distress tolerance and mindfulness skills with the intention of reducing anxiety by 50% over the next 6 months. Progress: 30% Interventions: Cognitive Behavioral Therapy  Diagnosis:PTSD, Bi-Polar  D/O  Plan: F/U/ appointments scheduled weekly.  [name], [facility]                                                                                                     [facility] Counselor/Therapist Progress Note  Patient ID: Stephen Myers, MRN: [id number],    Date: 04/17/2023  Time Spent: 60 minutes spent in person with the patient. Treatment Type: Individual Therapy Reported Symptoms: anxiety, panic attacks The patient continues to struggle with short-term memory.  He is changing some of his providers in the area as he did not get results back after  several weeks with minimal response.  He continues to be medication compliant.  He did spend some time with his son, daughter-in-law and grandson and says he is becoming more comfortable with his grandson.  He is also spending some time with his brothers over the next few days which he says is good for his anxiety.  His wife will be retiring next month and so they will be spending more time together.  We did talk more about his daughter and some of his feelings associated with her death.  He continues to grieve in a healthy way.  Lately he has been looking at some of her art work and recognizing the talent that he already knew she had but appreciates even more so now.  There have also been a couple of former coworkers who have reached out to connect to him which she has appreciated.  I encouraged continued use of his coping skills for anxiety reduction.  He continues to walk daily, fish at a private pond where no one else is when he can especially on Sundays when his wife is with him.  We will continue to work on coping skills for helping with his anxiety and the progression of his Lewy body dementia diagnosis as well as processing his grief. 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  Speech/Language:  Normal Rate  Affect: Appropriate  Mood: normal  Thought process: normal  Thought content:   WNL  Sensory/Perceptual disturbances:   WNL  Orientation: oriented to person, place, and situation  Attention: Good  Concentration: Good  Memory: Impaired short term  Fund of knowledge:  Good  Insight:   Good  Judgment:  Good  Impulse Control: Good   Risk Assessment: Danger to Self:  No Self-injurious Behavior: No Danger to Others: No Duty to Warn:no Physical Aggression / Violence:No  Access to Firearms a concern: No  Gang Involvement:No  Treatment plan: Will employ cognitive behavioral therapy as well as  grief and loss therapy for relief of anxiety and grief.  Goals of grief therapy or to have a healthier response to the loss of his daughter and less sadness as evidenced by patient report in therapy notes as well as to help him feel less overwhelmed by her loss.  Goals are to see a 50% reduction in grief symptoms over the next 6 months.  I  will encourage the use of the patient telling his grief story about his daughter including encouraging him to bring pictures and memorabilia to help process his feelings including any feelings of guilt associated with her loss.  We will use cognitive behavioral therapy to help identify and change anxiety producing thoughts and behavior patterns so as to improve his ability to better manage anxiety and stress, and manage thoughts and worrisome thinking contributing to anxiety.  We will also refresh and encourage dialectical behavior therapy distress tolerance and mindfulness skills with the intention of reducing anxiety by 50% over the next 6 months. Progress: 30% Interventions: Cognitive Behavioral Therapy  Diagnosis:PTSD, Bi-Polar D/O  Plan: F/U/ appointments scheduled weekly.  [name], [facility]                                                                                                                  [name], [facility]  [facility] Counselor/Therapist Progress Note  Patient ID: Stephen Myers, MRN: [id number],    Date: 04/17/2023  Time Spent: 60 minutes spent via video session. The pt. Was at ome and this therapist was in his home office. Treatment Type: Individual Therapy Reported Symptoms: anxiety, panic attacks The patient reports that he has been struggling with some of the same thoughts continually over the past few weeks.  Some of them are thoughts that he has dealt with in the past and has not necessarily gotten  resolution or answers for but he says they continue to be something he thinks about and they have affected his quality of life and his sleep recently.  We looked at the root cause behind some of these thoughts and why they are being continued and the anxiety that they are creating.  We looked at ways to try to get these thoughts answered in some way and the best way to present those questions but we also looked at what the patient does with those answers if they are not what he expects or if he does not get a clear answer at all.  We talked about the importance of coping with some of the feelings associated with these thoughts but also how to cognitively reframe or challenge these thoughts if necessary. We will continue to work on coping skills for helping with his anxiety and the progression of his Lewy body dementia diagnosis as well as processing his grief. 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  Speech/Language:  Normal Rate  Affect: Appropriate  Mood: normal  Thought process: normal  Thought content:   WNL  Sensory/Perceptual disturbances:   WNL  Orientation: oriented to person, place, and situation  Attention: Good  Concentration: Good  Memory: Impaired short term  Fund of knowledge:  Good  Insight:   Good  Judgment:  Good  Impulse Control: Good   Risk Assessment: Danger to Self:  No Self-injurious Behavior: No Danger to Others: No Duty  to Warn:no Physical Aggression / Violence:No  Access to Firearms a concern: No  Gang Involvement:No  Treatment plan: Will employ cognitive behavioral therapy as well as grief and loss therapy for relief of anxiety and grief.  Goals of grief therapy or to have a healthier response to the loss of his daughter and less sadness as evidenced by patient report in therapy notes as well as to help him feel less overwhelmed by her loss.  Goals are to see a 50%  reduction in grief symptoms over the next 6 months.  I will encourage the use of the patient telling his grief story about his daughter including encouraging him to bring pictures and memorabilia to help process his feelings including any feelings of guilt associated with her loss.  We will use cognitive behavioral therapy to help identify and change anxiety producing thoughts and behavior patterns so as to improve his ability to better manage anxiety and stress, and manage thoughts and worrisome thinking contributing to anxiety.  We will also refresh and encourage dialectical behavior therapy distress tolerance and mindfulness skills with the intention of reducing anxiety by 50% over the next 6 months. Progress: 30% Interventions: Cognitive Behavioral Therapy  Diagnosis:PTSD, Bi-Polar D/O  Plan: F/U/ appointments scheduled weekly.  [name], [facility]                                                                                                     [facility] Counselor/Therapist Progress Note  Patient ID: Stephen Myers, MRN: [id number],    Date: 04/17/2023  Time Spent: 60 minutes spent in person with the patient. Treatment Type: Individual Therapy Reported Symptoms: anxiety, panic attacks The patient continues to struggle with short-term memory.  He is changing some of his providers in the area as he did not get results back after several weeks with minimal response.  He continues to be medication compliant.  He did spend some time with his son, daughter-in-law and grandson and says he is becoming more comfortable with his grandson.  He is also spending some time with his brothers over the next few days which he says is good for his anxiety.  His wife will be retiring next month and so they will be spending more time together.  We did talk more about his daughter and some of his  feelings associated with her death.  He continues to grieve in a healthy way.  Lately he has been looking at some of her art work and recognizing the talent that he already knew she had but appreciates even more so now.  There have also been a couple of former coworkers who have reached out to connect to him which she has appreciated.  I encouraged continued use of his coping skills for anxiety reduction.  He continues to walk daily, fish at a private pond where no one else is when he can especially on Sundays when his wife is with him.  We will continue to work on coping skills for helping with his anxiety and the progression of his Lewy body dementia diagnosis  as well as processing his grief. 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  Speech/Language:  Normal Rate  Affect: Appropriate  Mood: normal  Thought process: normal  Thought content:   WNL  Sensory/Perceptual disturbances:   WNL  Orientation: oriented to person, place, and situation  Attention: Good  Concentration: Good  Memory: Impaired short term  Fund of knowledge:  Good  Insight:   Good  Judgment:  Good  Impulse Control: Good   Risk Assessment: Danger to Self:  No Self-injurious Behavior: No Danger to Others: No Duty to Warn:no Physical Aggression / Violence:No  Access to Firearms a concern: No  Gang Involvement:No  Treatment plan: Will employ cognitive behavioral therapy as well as grief and loss therapy for relief of anxiety and grief.  Goals of grief therapy or to have a healthier response to the loss of his daughter and less sadness as evidenced by patient report in therapy notes as well as to help him feel less overwhelmed by her loss.  Goals are to see a 50% reduction in grief symptoms over the next 6 months.  I will encourage the use of the patient telling his grief story about his daughter including encouraging him to bring  pictures and memorabilia to help process his feelings including any feelings of guilt associated with her loss.  We will use cognitive behavioral therapy to help identify and change anxiety producing thoughts and behavior patterns so as to improve his ability to better manage anxiety and stress, and manage thoughts and worrisome thinking contributing to anxiety.  We will also refresh and encourage dialectical behavior therapy distress tolerance and mindfulness skills with the intention of reducing anxiety by 50% over the next 6 months. Progress: 30% Interventions: Cognitive Behavioral Therapy  Diagnosis:PTSD, Bi-Polar D/O  Plan: F/U/ appointments scheduled weekly.  [name], [facility]                                                                                               [facility] Counselor/Therapist Progress Note  Patient ID: Stephen Myers, MRN: [id number],    Date: 04/17/2023  Time Spent: 57 minutes, 2 PM to 2:57 PM spent via video session with the patient.  The patient was at home and this therapist was in his home office.  The patient is aware of the limitations of telehealth video visits. Treatment Type: Individual Therapy Reported Symptoms: anxiety, panic attacks The patient reports an increase in irritability over the past week with no specific trigger.  He knows that irritability is a symptom of the progression of the Lewy body dementia.  He recognizes that he gets irritable easier but does not like to be like that especially when he gets short with his wife.  He knows that she is understanding but feels bad about that.  We talked about some possible triggers for his irritability.  He said he had worked on writing down some of the things that he has a hard time getting out of his head and that  helps some but he needs to do so consistently.  I encouraged him to practice  saying it becomes habit the more you do it and it might help over thinking things.  He is using coping skills as best he can around the house and at home.  He is still walking every morning.  He is fishing as he is able and is going on weekends with his wife.  He is spending time with his son and daughter-in-law and grandson as much as he can.  He still feels that he is grieving his daughter's death in a healthy way but knows the upcoming anniversary of her death on August 09, 2024is in the back of his mind and he sees the impact that it has on his wife also and he feels for her.   We will continue to work on coping skills for helping with his anxiety and the progression of his Lewy body dementia diagnosis as well as processing his grief. 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  Speech/Language:  Normal Rate  Affect: Appropriate  Mood: normal  Thought process: normal  Thought content:   WNL  Sensory/Perceptual disturbances:   WNL  Orientation: oriented to person, place, and situation  Attention: Good  Concentration: Good  Memory: Impaired short term  Fund of knowledge:  Good  Insight:   Good  Judgment:  Good  Impulse Control: Good   Risk Assessment: Danger to Self:  No Self-injurious Behavior: No Danger to Others: No Duty to Warn:no Physical Aggression / Violence:No  Access to Firearms a concern: No  Gang Involvement:No  Treatment plan: Will employ cognitive behavioral therapy as well as grief and loss therapy for relief of anxiety and grief.  Goals of grief therapy or to have a healthier response to the loss of his daughter and less sadness as evidenced by patient report in therapy notes as well as to help him feel less overwhelmed by her loss.  Goals are to see a 50% reduction in grief symptoms over the next 6 months.  I will encourage the use of the patient telling his grief story about his  daughter including encouraging him to bring pictures and memorabilia to help process his feelings including any feelings of guilt associated with her loss.  We will use cognitive behavioral therapy to help identify and change anxiety producing thoughts and behavior patterns so as to improve his ability to better manage anxiety and stress, and manage thoughts and worrisome thinking contributing to anxiety.  We will also refresh and encourage dialectical behavior therapy distress tolerance and mindfulness skills with the intention of reducing anxiety by 50% over the next 6 months. Progress: 30% Interventions: Cognitive Behavioral Therapy  Diagnosis:PTSD, Bi-Polar D/O  Plan: F/U/ appointments scheduled weekly.  [name], [facility]

## 2023-04-24 ENCOUNTER — Ambulatory Visit (INDEPENDENT_AMBULATORY_CARE_PROVIDER_SITE_OTHER): Payer: Medicare Other | Admitting: Behavioral Health

## 2023-04-24 ENCOUNTER — Encounter: Payer: Self-pay | Admitting: Behavioral Health

## 2023-04-24 DIAGNOSIS — F3181 Bipolar II disorder: Secondary | ICD-10-CM | POA: Diagnosis not present
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
## 2023-05-01 ENCOUNTER — Encounter: Payer: Self-pay | Admitting: Behavioral Health

## 2023-05-01 ENCOUNTER — Ambulatory Visit: Payer: Medicare Other | Admitting: Behavioral Health

## 2023-05-01 DIAGNOSIS — F4310 Post-traumatic stress disorder, unspecified: Secondary | ICD-10-CM

## 2023-05-01 DIAGNOSIS — F3181 Bipolar II disorder: Secondary | ICD-10-CM | POA: Diagnosis not present
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
## 2023-05-08 ENCOUNTER — Encounter: Payer: Self-pay | Admitting: Behavioral Health

## 2023-05-08 ENCOUNTER — Ambulatory Visit (INDEPENDENT_AMBULATORY_CARE_PROVIDER_SITE_OTHER): Payer: Medicare Other | Admitting: Behavioral Health

## 2023-05-08 DIAGNOSIS — F331 Major depressive disorder, recurrent, moderate: Secondary | ICD-10-CM | POA: Diagnosis not present

## 2023-05-08 DIAGNOSIS — F411 Generalized anxiety disorder: Secondary | ICD-10-CM

## 2023-05-08 DIAGNOSIS — G3183 Dementia with Lewy bodies: Secondary | ICD-10-CM
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
## 2023-05-15 ENCOUNTER — Ambulatory Visit: Payer: BC Managed Care – PPO | Admitting: Behavioral Health

## 2023-05-22 ENCOUNTER — Encounter: Payer: Self-pay | Admitting: Behavioral Health

## 2023-05-22 ENCOUNTER — Ambulatory Visit (INDEPENDENT_AMBULATORY_CARE_PROVIDER_SITE_OTHER): Payer: Medicare Other | Admitting: Behavioral Health

## 2023-05-22 DIAGNOSIS — F3181 Bipolar II disorder: Secondary | ICD-10-CM | POA: Diagnosis not present

## 2023-05-22 DIAGNOSIS — F4310 Post-traumatic stress disorder, unspecified: Secondary | ICD-10-CM
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
## 2023-05-29 ENCOUNTER — Ambulatory Visit: Payer: BC Managed Care – PPO | Admitting: Behavioral Health

## 2023-06-05 ENCOUNTER — Ambulatory Visit (INDEPENDENT_AMBULATORY_CARE_PROVIDER_SITE_OTHER): Payer: Medicare Other | Admitting: Behavioral Health

## 2023-06-05 ENCOUNTER — Encounter: Payer: Self-pay | Admitting: Behavioral Health

## 2023-06-05 DIAGNOSIS — F3181 Bipolar II disorder: Secondary | ICD-10-CM | POA: Diagnosis not present

## 2023-06-05 DIAGNOSIS — F319 Bipolar disorder, unspecified: Secondary | ICD-10-CM | POA: Diagnosis not present

## 2023-06-05 DIAGNOSIS — F4310 Post-traumatic stress disorder, unspecified: Secondary | ICD-10-CM | POA: Diagnosis not present
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
## 2023-06-12 ENCOUNTER — Ambulatory Visit: Payer: BC Managed Care – PPO | Admitting: Behavioral Health

## 2023-06-19 ENCOUNTER — Ambulatory Visit (INDEPENDENT_AMBULATORY_CARE_PROVIDER_SITE_OTHER): Payer: Medicare Other | Admitting: Behavioral Health

## 2023-06-19 ENCOUNTER — Encounter: Payer: Self-pay | Admitting: Behavioral Health

## 2023-06-19 DIAGNOSIS — F4310 Post-traumatic stress disorder, unspecified: Secondary | ICD-10-CM

## 2023-06-19 DIAGNOSIS — F319 Bipolar disorder, unspecified: Secondary | ICD-10-CM
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
## 2023-06-26 ENCOUNTER — Ambulatory Visit (INDEPENDENT_AMBULATORY_CARE_PROVIDER_SITE_OTHER): Payer: Medicare Other | Admitting: Behavioral Health

## 2023-06-26 ENCOUNTER — Encounter: Payer: Self-pay | Admitting: Behavioral Health

## 2023-06-26 DIAGNOSIS — F4310 Post-traumatic stress disorder, unspecified: Secondary | ICD-10-CM

## 2023-06-26 DIAGNOSIS — F319 Bipolar disorder, unspecified: Secondary | ICD-10-CM

## 2023-06-26 DIAGNOSIS — F3181 Bipolar II disorder: Secondary | ICD-10-CM
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
## 2023-07-03 ENCOUNTER — Ambulatory Visit: Payer: BC Managed Care – PPO | Admitting: Behavioral Health

## 2023-07-03 ENCOUNTER — Encounter: Payer: Self-pay | Admitting: Behavioral Health

## 2023-07-03 DIAGNOSIS — F3181 Bipolar II disorder: Secondary | ICD-10-CM

## 2023-07-03 DIAGNOSIS — F4310 Post-traumatic stress disorder, unspecified: Secondary | ICD-10-CM | POA: Diagnosis not present

## 2023-07-03 DIAGNOSIS — F319 Bipolar disorder, unspecified: Secondary | ICD-10-CM
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
## 2023-07-10 ENCOUNTER — Ambulatory Visit (INDEPENDENT_AMBULATORY_CARE_PROVIDER_SITE_OTHER): Payer: Medicare Other | Admitting: Behavioral Health

## 2023-07-10 ENCOUNTER — Encounter: Payer: Self-pay | Admitting: Behavioral Health

## 2023-07-10 DIAGNOSIS — F319 Bipolar disorder, unspecified: Secondary | ICD-10-CM

## 2023-07-10 DIAGNOSIS — F4310 Post-traumatic stress disorder, unspecified: Secondary | ICD-10-CM
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
## 2023-07-17 ENCOUNTER — Ambulatory Visit: Payer: Medicare Other | Admitting: Behavioral Health

## 2023-07-17 DIAGNOSIS — F4310 Post-traumatic stress disorder, unspecified: Secondary | ICD-10-CM

## 2023-07-17 DIAGNOSIS — F3181 Bipolar II disorder: Secondary | ICD-10-CM

## 2023-07-18 ENCOUNTER — Encounter: Payer: Self-pay | Admitting: Behavioral Health

## 2023-07-18 NOTE — Progress Notes (Addendum)
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 what he expects or if he does not get a clear answer at all.  We talked about the importance of coping with some of the feelings associated with these thoughts but also how  to cognitively reframe or challenge these thoughts if necessary. We will continue to work on coping skills for helping with his anxiety and the progression of his Lewy body dementia diagnosis as well as processing his grief. 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  Speech/Language:  Normal Rate  Affect: Appropriate  Mood: normal  Thought process: normal  Thought content:   WNL  Sensory/Perceptual disturbances:   WNL  Orientation: oriented to person, place, and situation  Attention: Good  Concentration: Good  Memory: Impaired short term  Fund of knowledge:  Good  Insight:   Good  Judgment:  Good  Impulse Control: Good   Risk Assessment: Danger to Self:  No Self-injurious Behavior: No Danger to Others: No Duty to Warn:no Physical Aggression / Violence:No  Access to Firearms a concern: No  Gang Involvement:No  Treatment plan: Will employ cognitive behavioral therapy as well as grief and loss therapy for relief of anxiety and grief.  Goals of grief therapy or to have a healthier response to the loss of his daughter and less sadness as evidenced by patient report in therapy notes as well as to help him feel less overwhelmed by her loss.  Goals are to see a 50% reduction in grief symptoms over the next 6 months.  I will encourage the use of the patient telling his grief story about his daughter including encouraging him to bring pictures and memorabilia to help process his feelings including any feelings of guilt associated with her loss.  We will use cognitive behavioral therapy to help identify and  change anxiety producing thoughts and behavior patterns so as to improve his ability to better manage anxiety and stress, and manage thoughts and worrisome thinking contributing to anxiety.  We will also refresh and encourage dialectical behavior therapy distress tolerance and mindfulness skills with the intention of reducing anxiety by 50% over the next 6 months. Progress: 30% Interventions: Cognitive Behavioral Therapy  Diagnosis:PTSD, Bi-Polar D/O  Plan: F/U/ appointments scheduled weekly.  [name], [facility]                                                                                                     [facility] Counselor/Therapist Progress Note  Patient ID: Elza Sortor, MRN: [id number],    Date: 07/18/2023  Time Spent: 60 minutes spent in person with the patient. Treatment Type: Individual Therapy Reported Symptoms: anxiety, panic attacks The patient continues to struggle with short-term memory.  He is changing some of his providers in the area as he did not get results back after several weeks with minimal response.  He continues to be medication compliant.  He did spend some time with his son, daughter-in-law and grandson and says he is becoming more comfortable with his grandson.  He is also spending some time with his brothers over the next few days which he says is good for his anxiety.  His wife will be retiring next month and so they will be spending  more time together.  We did talk more about his daughter and some of his feelings associated with her death.  He continues to grieve in a healthy way.  Lately he has been looking at some of her art work and recognizing the talent that he already knew she had but appreciates even more so now.  There have also been a couple of former coworkers who have reached out to connect to him which she has appreciated.  I  encouraged continued use of his coping skills for anxiety reduction.  He continues to walk daily, fish at a private pond where no one else is when he can especially on Sundays when his wife is with him.  We will continue to work on coping skills for helping with his anxiety and the progression of his Lewy body dementia diagnosis as well as processing his grief. 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  Speech/Language:  Normal Rate  Affect: Appropriate  Mood: normal  Thought process: normal  Thought content:   WNL  Sensory/Perceptual disturbances:   WNL  Orientation: oriented to person, place, and situation  Attention: Good  Concentration: Good  Memory: Impaired short term  Fund of knowledge:  Good  Insight:   Good  Judgment:  Good  Impulse Control: Good   Risk Assessment: Danger to Self:  No Self-injurious Behavior: No Danger to Others: No Duty to Warn:no Physical Aggression / Violence:No  Access to Firearms a concern: No  Gang Involvement:No  Treatment plan: Will employ cognitive behavioral therapy as well as grief and loss therapy for relief of anxiety and grief.  Goals of grief therapy or to have a healthier response to the loss of his daughter and less sadness as evidenced by patient report in therapy notes as well as to help him feel less overwhelmed by her loss.  Goals are to see a 50% reduction in grief symptoms over the next 6 months.  I will encourage the use of the patient telling his grief story about his daughter including encouraging him to bring pictures and memorabilia to help process his feelings including any feelings of guilt associated with her loss.  We will use cognitive behavioral therapy to help identify and change anxiety producing thoughts and behavior patterns so as to improve his ability to better manage anxiety and stress, and manage thoughts and worrisome thinking  contributing to anxiety.  We will also refresh and encourage dialectical behavior therapy distress tolerance and mindfulness skills with the intention of reducing anxiety by 50% over the next 6 months. Progress: 30% Interventions: Cognitive Behavioral Therapy  Diagnosis:PTSD, Bi-Polar D/O  Plan: F/U/ appointments scheduled weekly.  [name], [facility]                                                                                                     [facility] Counselor/Therapist Progress Note  Patient ID: Sayre Witherington, MRN: [id number],    Date: 07/18/2023  Time Spent: 60 minutes spent  in person with the patient. Treatment Type: Individual Therapy Reported Symptoms: anxiety, panic attacks The patient continues to struggle with short-term memory.  He is changing some of his providers in the area as he did not get results back after several weeks with minimal response.  He continues to be medication compliant.  He did spend some time with his son, daughter-in-law and grandson and says he is becoming more comfortable with his grandson.  He is also spending some time with his brothers over the next few days which he says is good for his anxiety.  His wife will be retiring next month and so they will be spending more time together.  We did talk more about his daughter and some of his feelings associated with her death.  He continues to grieve in a healthy way.  Lately he has been looking at some of her art work and recognizing the talent that he already knew she had but appreciates even more so now.  There have also been a couple of former coworkers who have reached out to connect to him which she has appreciated.  I encouraged continued use of his coping skills for anxiety reduction.  He continues to walk daily, fish at a private pond where no one else is when he can especially on  Sundays when his wife is with him.  We will continue to work on coping skills for helping with his anxiety and the progression of his Lewy body dementia diagnosis as well as processing his grief. 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  Speech/Language:  Normal Rate  Affect: Appropriate  Mood: normal  Thought process: normal  Thought content:   WNL  Sensory/Perceptual disturbances:   WNL  Orientation: oriented to person, place, and situation  Attention: Good  Concentration: Good  Memory: Impaired short term  Fund of knowledge:  Good  Insight:   Good  Judgment:  Good  Impulse Control: Good   Risk Assessment: Danger to Self:  No Self-injurious Behavior: No Danger to Others: No Duty to Warn:no Physical Aggression / Violence:No  Access to Firearms a concern: No  Gang Involvement:No  Treatment plan: Will employ cognitive behavioral therapy as well as grief and loss therapy for relief of anxiety and grief.  Goals of grief therapy or to have a healthier response to the loss of his daughter and less sadness as evidenced by patient report in therapy notes as well as to help him feel less overwhelmed by her loss.  Goals are to see a 50% reduction in grief symptoms over the next 6 months.  I will encourage the use of the patient telling his grief story about his daughter including encouraging him to bring pictures and memorabilia to help process his feelings including any feelings of guilt associated with her loss.  We will use cognitive behavioral therapy to help identify and change anxiety producing thoughts and behavior patterns so as to improve his ability to better manage anxiety and stress, and manage thoughts and worrisome thinking contributing to anxiety.  We will also refresh and encourage dialectical behavior therapy distress tolerance and mindfulness skills with the intention of reducing  anxiety by 50% over the next 6 months. Progress: 30% Interventions: Cognitive Behavioral Therapy  Diagnosis:PTSD, Bi-Polar D/O  Plan: F/U/ appointments scheduled weekly.  [name], [facility]  [name], [facility]  [facility] Counselor/Therapist Progress Note  Patient ID: Colden Samaras, MRN: [id number],    Date: 07/18/2023  Time Spent: 60 minutes spent via video session. [place]. [place] [facility] and this therapist was in his home office. Treatment Type: Individual Therapy Reported Symptoms: anxiety, panic attacks The patient reports that he has been struggling with some of the same thoughts continually over the past few weeks.  Some of them are thoughts that he has dealt with in the past and has not necessarily gotten resolution or answers for but he says they continue to be something he thinks about and they have affected his quality of life and his sleep recently.  We looked at the root cause behind some of these thoughts and why they are being continued and the anxiety that they are creating.  We looked at ways to try to get these thoughts answered in some way and the best way to present those questions but we also looked at what the patient does with those answers if they are not what he expects or if he does not get a clear answer at all.  We talked about the importance of coping with some of the feelings associated with these thoughts but also how to cognitively reframe or challenge these thoughts if necessary. We will continue to work on coping skills for helping with his anxiety and the progression of his Lewy body dementia diagnosis as well as processing his grief. Mental Status Exam: Appearance:  Well Groomed     Behavior: Appropriate  Motor: Pt.  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  Speech/Language:  Normal Rate  Affect: Appropriate  Mood: normal  Thought process: normal  Thought content:   WNL  Sensory/Perceptual disturbances:   WNL  Orientation: oriented to person, place, and situation  Attention: Good  Concentration: Good  Memory: Impaired short term  Fund of knowledge:  Good  Insight:   Good  Judgment:  Good  Impulse Control: Good   Risk Assessment: Danger to Self:  No Self-injurious Behavior: No Danger to Others: No Duty to Warn:no Physical Aggression / Violence:No  Access to Firearms a concern: No  Gang Involvement:No  Treatment plan: Will employ cognitive behavioral therapy as well as grief and loss therapy for relief of anxiety and grief.  Goals of grief therapy or to have a healthier response to the loss of his daughter and less sadness as evidenced by patient report in therapy notes as well as to help him feel less overwhelmed by her loss.  Goals are to see a 50% reduction in grief symptoms over the next 6 months.  I will encourage the use of the patient telling his grief story about his daughter including encouraging him to bring pictures and memorabilia to help process his feelings including any feelings of guilt associated with her loss.  We will use cognitive behavioral therapy to help identify and change anxiety producing thoughts and behavior patterns so as to improve his ability to better manage anxiety and stress, and manage thoughts and worrisome thinking contributing to anxiety.  We will also refresh and encourage dialectical behavior therapy distress tolerance and mindfulness skills with the intention of reducing anxiety by 50% over the next 6 months. Progress: 30% Interventions: Cognitive Behavioral Therapy  Diagnosis:PTSD, Bi-Polar D/O  Plan: F/U/ appointments scheduled weekly.  [name],  [facility]  [facility] Counselor/Therapist Progress Note  Patient ID: Aryon Nham, MRN: [id number],    Date: 07/18/2023  Time Spent: 60 minutes spent in person with the patient. Treatment Type: Individual Therapy Reported Symptoms: anxiety, panic attacks The patient continues to struggle with short-term memory.  He is changing some of his providers in the area as he did not get results back after several weeks with minimal response.  He continues to be medication compliant.  He did spend some time with his son, daughter-in-law and grandson and says he is becoming more comfortable with his grandson.  He is also spending some time with his brothers over the next few days which he says is good for his anxiety.  His wife will be retiring next month and so they will be spending more time together.  We did talk more about his daughter and some of his feelings associated with her death.  He continues to grieve in a healthy way.  Lately he has been looking at some of her art work and recognizing the talent that he already knew she had but appreciates even more so now.  There have also been a couple of former coworkers who have reached out to connect to him which she has appreciated.  I encouraged continued use of his coping skills for anxiety reduction.  He continues to walk daily, fish at a private pond where no one else is when he can especially on Sundays when his wife is with him.  We will continue to work on coping skills for helping with his anxiety and the progression of his Lewy body dementia diagnosis as well as processing his grief. 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  Speech/Language:   Normal Rate  Affect: Appropriate  Mood: normal  Thought process: normal  Thought content:   WNL  Sensory/Perceptual disturbances:   WNL  Orientation: oriented to person, place, and situation  Attention: Good  Concentration: Good  Memory: Impaired short term  Fund of knowledge:  Good  Insight:   Good  Judgment:  Good  Impulse Control: Good   Risk Assessment: Danger to Self:  No Self-injurious Behavior: No Danger to Others: No Duty to Warn:no Physical Aggression / Violence:No  Access to Firearms a concern: No  Gang Involvement:No  Treatment plan: Will employ cognitive behavioral therapy as well as grief and loss therapy for relief of anxiety and grief.  Goals of grief therapy or to have a healthier response to the loss of his daughter and less sadness as evidenced by patient report in therapy notes as well as to help him feel less overwhelmed by her loss.  Goals are to see a 50% reduction in grief symptoms over the next 6 months.  I will encourage the use of the patient telling his grief story about his daughter including encouraging him to bring pictures and memorabilia to help process his feelings including any feelings of guilt associated with her loss.  We will use cognitive behavioral therapy to help identify and change anxiety producing thoughts and behavior patterns so as to improve his ability to better manage anxiety and stress, and manage thoughts and worrisome thinking contributing to anxiety.  We will also refresh and encourage dialectical behavior therapy distress tolerance and mindfulness skills with the intention of reducing anxiety by 50% over the next 6 months. Progress: 30% Interventions: Cognitive Behavioral Therapy  Diagnosis:PTSD, Bi-Polar D/O  Plan: F/U/ appointments scheduled weekly.  [name],  [facility]                                                                                     [facility] Counselor/Therapist Progress Note  Patient ID: Sylus Stgermain, MRN: [id number],    Date: 07/18/2023 This session was held via video teletherapy. The patient consented to the video teletherapy and was located in his home during this session. He is aware it is the responsibility of the patient to secure confidentiality on his end of the session. The provider was in a private home office for the duration of this session.    The patient arrived on time for her Caregility session  Time Spent: 58 minutes, 2 PM to 2:58 PM  Treatment Type: Individual Therapy Reported Symptoms: anxiety, panic attacks The patient's back is feeling better but still is not where he wants it to be.  He is going to physical therapy and doing as much self-care as he can.  He is also experiencing some soreness in his wrist and hands from having to push up to get out of chairs because of his back.  He also rolled his ankle a little bit trying to walk.  He says that he cannot walk as longer is for her right now because of his back but is continuing to stretch and walk as much as he can.  There are a few other self-care things that he is doing which are good for him mentally.  He acknowledges the importance of physical activity plays in terms of his mental health.  He said especially when his back was really bad and he could not get up he had some very depressed days where he had passive self-harm thoughts but contracts for safety saying he would never act on them.  We talked about the importance of doing something even when he does not feel like including getting out of the house more.  He says that now that his wife is her tired he can do that more since he cannot drive.  I encouraged him to do that at least once a day even if only for a  few minutes to ride somewhere to get away from the house.  He and his wife can walk together and go fishing together when he feels better.  He also has some plans for his brother-in-law to come down for a few days because his wife is having a medical procedure next week but also to spend some time with his brothers which he enjoys.  He continues to report difficulty with short-term memory but is working in different ways to make that as easy for himself as possible We will continue to work on coping skills for helping with his anxiety and the progression of his Lewy body dementia diagnosis as well as processing his grief. 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  Speech/Language:  Normal Rate  Affect: Appropriate  Mood: normal  Thought process: normal  Thought content:   WNL  Sensory/Perceptual disturbances:  WNL  Orientation: oriented to person, place, and situation  Attention: Good  Concentration: Good  Memory: Impaired short term  Fund of knowledge:  Good  Insight:   Good  Judgment:  Good  Impulse Control: Good   Risk Assessment: Danger to Self:  No Self-injurious Behavior: No Danger to Others: No Duty to Warn:no Physical Aggression / Violence:No  Access to Firearms a concern: No  Gang Involvement:No  Treatment plan: Will employ cognitive behavioral therapy as well as grief and loss therapy for relief of anxiety and grief.  Goals of grief therapy or to have a healthier response to the loss of his daughter and less sadness as evidenced by patient report in therapy notes as well as to help him feel less overwhelmed by her loss.  Goals are to see a 50% reduction in grief symptoms over the next 6 months.  I will encourage the use of the patient telling his grief story about his daughter including encouraging him to bring pictures and memorabilia to help process his feelings including any  feelings of guilt associated with her loss.  We will use cognitive behavioral therapy to help identify and change anxiety producing thoughts and behavior patterns so as to improve his ability to better manage anxiety and stress, and manage thoughts and worrisome thinking contributing to anxiety.  We will also refresh and encourage dialectical behavior therapy distress tolerance and mindfulness skills with the intention of reducing anxiety by 50% over the next 6 months. Progress: 30% Interventions: Cognitive Behavioral Therapy  Diagnosis:PTSD, Bi-Polar D/O  Plan: F/U/ appointments scheduled weekly.  [name], [facility]                                                                                                                 [name], [facility]               [name], [facility]               [name], [facility]               [name], [facility]               [name], [facility]               [name], [facility]               [name], [facility]               [name], [facility]

## 2023-07-24 ENCOUNTER — Encounter: Payer: Self-pay | Admitting: Behavioral Health

## 2023-07-24 ENCOUNTER — Ambulatory Visit (INDEPENDENT_AMBULATORY_CARE_PROVIDER_SITE_OTHER): Payer: Medicare Other | Admitting: Behavioral Health

## 2023-07-24 DIAGNOSIS — F4310 Post-traumatic stress disorder, unspecified: Secondary | ICD-10-CM | POA: Diagnosis not present

## 2023-07-24 DIAGNOSIS — F319 Bipolar disorder, unspecified: Secondary | ICD-10-CM

## 2023-07-24 NOTE — Progress Notes (Signed)
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 or if he does not get a clear answer at all.  We talked about the importance of coping with some of the feelings associated with these thoughts but also how  to cognitively reframe or challenge these thoughts if necessary. We will continue to work on coping skills for helping with his anxiety and the progression of his Lewy body dementia diagnosis as well as processing his grief. 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  Speech/Language:  Normal Rate  Affect: Appropriate  Mood: normal  Thought process: normal  Thought content:   WNL  Sensory/Perceptual disturbances:   WNL  Orientation: oriented to person, place, and situation  Attention: Good  Concentration: Good  Memory: Impaired short term  Fund of knowledge:  Good  Insight:   Good  Judgment:  Good  Impulse Control: Good   Risk Assessment: Danger to Self:  No Self-injurious Behavior: No Danger to Others: No Duty to Warn:no Physical Aggression / Violence:No  Access to Firearms a concern: No  Gang Involvement:No  Treatment plan: Will employ cognitive behavioral therapy as well as grief and loss therapy for relief of anxiety and grief.  Goals of grief therapy or to have a healthier response to the loss of his daughter and less sadness as evidenced by patient report in therapy notes as well as to help him feel less overwhelmed by her loss.  Goals are to see a 50% reduction in grief symptoms over the next 6 months.  I will encourage the use of the patient telling his grief story about his daughter including encouraging him to bring pictures and memorabilia to help process his feelings including any feelings of guilt associated with her loss.  We will use cognitive behavioral therapy to help identify and  change anxiety producing thoughts and behavior patterns so as to improve his ability to better manage anxiety and stress, and manage thoughts and worrisome thinking contributing to anxiety.  We will also refresh and encourage dialectical behavior therapy distress tolerance and mindfulness skills with the intention of reducing anxiety by 50% over the next 6 months. Progress: 30% Interventions: Cognitive Behavioral Therapy  Diagnosis:PTSD, Bi-Polar D/O  Plan: F/U/ appointments scheduled weekly.  [name], [facility]                                                                                                     [facility] Counselor/Therapist Progress Note  Patient ID: Stephen Myers, MRN: [id number],    Date: 07/24/2023  Time Spent: 60 minutes spent in person with the patient. Treatment Type: Individual Therapy Reported Symptoms: anxiety, panic attacks The patient continues to struggle with short-term memory.  He is changing some of his providers in the area as he did not get results back after several weeks with minimal response.  He continues to be medication compliant.  He did spend some time with his son, daughter-in-law and grandson and says he is becoming more comfortable with his grandson.  He is also spending some time with his brothers over the next few days which he says is good for his anxiety.  His wife will be retiring next month and so they will be spending  more time together.  We did talk more about his daughter and some of his feelings associated with her death.  He continues to grieve in a healthy way.  Lately he has been looking at some of her art work and recognizing the talent that he already knew she had but appreciates even more so now.  There have also been a couple of former coworkers who have reached out to connect to him which she has appreciated.  I  encouraged continued use of his coping skills for anxiety reduction.  He continues to walk daily, fish at a private pond where no one else is when he can especially on Sundays when his wife is with him.  We will continue to work on coping skills for helping with his anxiety and the progression of his Lewy body dementia diagnosis as well as processing his grief. 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  Speech/Language:  Normal Rate  Affect: Appropriate  Mood: normal  Thought process: normal  Thought content:   WNL  Sensory/Perceptual disturbances:   WNL  Orientation: oriented to person, place, and situation  Attention: Good  Concentration: Good  Memory: Impaired short term  Fund of knowledge:  Good  Insight:   Good  Judgment:  Good  Impulse Control: Good   Risk Assessment: Danger to Self:  No Self-injurious Behavior: No Danger to Others: No Duty to Warn:no Physical Aggression / Violence:No  Access to Firearms a concern: No  Gang Involvement:No  Treatment plan: Will employ cognitive behavioral therapy as well as grief and loss therapy for relief of anxiety and grief.  Goals of grief therapy or to have a healthier response to the loss of his daughter and less sadness as evidenced by patient report in therapy notes as well as to help him feel less overwhelmed by her loss.  Goals are to see a 50% reduction in grief symptoms over the next 6 months.  I will encourage the use of the patient telling his grief story about his daughter including encouraging him to bring pictures and memorabilia to help process his feelings including any feelings of guilt associated with her loss.  We will use cognitive behavioral therapy to help identify and change anxiety producing thoughts and behavior patterns so as to improve his ability to better manage anxiety and stress, and manage thoughts and worrisome thinking  contributing to anxiety.  We will also refresh and encourage dialectical behavior therapy distress tolerance and mindfulness skills with the intention of reducing anxiety by 50% over the next 6 months. Progress: 30% Interventions: Cognitive Behavioral Therapy  Diagnosis:PTSD, Bi-Polar D/O  Plan: F/U/ appointments scheduled weekly.  [name], [facility]                                                                                                     [facility] Counselor/Therapist Progress Note  Patient ID: Stephen Myers, MRN: [id number],    Date: 07/24/2023  Time Spent: 60 minutes spent  in person with the patient. Treatment Type: Individual Therapy Reported Symptoms: anxiety, panic attacks The patient continues to struggle with short-term memory.  He is changing some of his providers in the area as he did not get results back after several weeks with minimal response.  He continues to be medication compliant.  He did spend some time with his son, daughter-in-law and grandson and says he is becoming more comfortable with his grandson.  He is also spending some time with his brothers over the next few days which he says is good for his anxiety.  His wife will be retiring next month and so they will be spending more time together.  We did talk more about his daughter and some of his feelings associated with her death.  He continues to grieve in a healthy way.  Lately he has been looking at some of her art work and recognizing the talent that he already knew she had but appreciates even more so now.  There have also been a couple of former coworkers who have reached out to connect to him which she has appreciated.  I encouraged continued use of his coping skills for anxiety reduction.  He continues to walk daily, fish at a private pond where no one else is when he can especially on  Sundays when his wife is with him.  We will continue to work on coping skills for helping with his anxiety and the progression of his Lewy body dementia diagnosis as well as processing his grief. 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  Speech/Language:  Normal Rate  Affect: Appropriate  Mood: normal  Thought process: normal  Thought content:   WNL  Sensory/Perceptual disturbances:   WNL  Orientation: oriented to person, place, and situation  Attention: Good  Concentration: Good  Memory: Impaired short term  Fund of knowledge:  Good  Insight:   Good  Judgment:  Good  Impulse Control: Good   Risk Assessment: Danger to Self:  No Self-injurious Behavior: No Danger to Others: No Duty to Warn:no Physical Aggression / Violence:No  Access to Firearms a concern: No  Gang Involvement:No  Treatment plan: Will employ cognitive behavioral therapy as well as grief and loss therapy for relief of anxiety and grief.  Goals of grief therapy or to have a healthier response to the loss of his daughter and less sadness as evidenced by patient report in therapy notes as well as to help him feel less overwhelmed by her loss.  Goals are to see a 50% reduction in grief symptoms over the next 6 months.  I will encourage the use of the patient telling his grief story about his daughter including encouraging him to bring pictures and memorabilia to help process his feelings including any feelings of guilt associated with her loss.  We will use cognitive behavioral therapy to help identify and change anxiety producing thoughts and behavior patterns so as to improve his ability to better manage anxiety and stress, and manage thoughts and worrisome thinking contributing to anxiety.  We will also refresh and encourage dialectical behavior therapy distress tolerance and mindfulness skills with the intention of reducing  anxiety by 50% over the next 6 months. Progress: 30% Interventions: Cognitive Behavioral Therapy  Diagnosis:PTSD, Bi-Polar D/O  Plan: F/U/ appointments scheduled weekly.  [name], [facility]  [name], [facility]  [facility] Counselor/Therapist Progress Note  Patient ID: Stephen Myers, MRN: [id number],    Date: 07/24/2023  Time Spent: 60 minutes spent via video session. The [place]. Was [place] [facility] and this therapist was in his home office. Treatment Type: Individual Therapy Reported Symptoms: anxiety, panic attacks The patient reports that he has been struggling with some of the same thoughts continually over the past few weeks.  Some of them are thoughts that he has dealt with in the past and has not necessarily gotten resolution or answers for but he says they continue to be something he thinks about and they have affected his quality of life and his sleep recently.  We looked at the root cause behind some of these thoughts and why they are being continued and the anxiety that they are creating.  We looked at ways to try to get these thoughts answered in some way and the best way to present those questions but we also looked at what the patient does with those answers if they are not what he expects or if he does not get a clear answer at all.  We talked about the importance of coping with some of the feelings associated with these thoughts but also how to cognitively reframe or challenge these thoughts if necessary. We will continue to work on coping skills for helping with his anxiety and the progression of his Lewy body dementia diagnosis as well as processing his grief. Mental Status Exam: Appearance:  Well Groomed     Behavior: Appropriate  Motor: Pt.  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  Speech/Language:  Normal Rate  Affect: Appropriate  Mood: normal  Thought process: normal  Thought content:   WNL  Sensory/Perceptual disturbances:   WNL  Orientation: oriented to person, place, and situation  Attention: Good  Concentration: Good  Memory: Impaired short term  Fund of knowledge:  Good  Insight:   Good  Judgment:  Good  Impulse Control: Good   Risk Assessment: Danger to Self:  No Self-injurious Behavior: No Danger to Others: No Duty to Warn:no Physical Aggression / Violence:No  Access to Firearms a concern: No  Gang Involvement:No  Treatment plan: Will employ cognitive behavioral therapy as well as grief and loss therapy for relief of anxiety and grief.  Goals of grief therapy or to have a healthier response to the loss of his daughter and less sadness as evidenced by patient report in therapy notes as well as to help him feel less overwhelmed by her loss.  Goals are to see a 50% reduction in grief symptoms over the next 6 months.  I will encourage the use of the patient telling his grief story about his daughter including encouraging him to bring pictures and memorabilia to help process his feelings including any feelings of guilt associated with her loss.  We will use cognitive behavioral therapy to help identify and change anxiety producing thoughts and behavior patterns so as to improve his ability to better manage anxiety and stress, and manage thoughts and worrisome thinking contributing to anxiety.  We will also refresh and encourage dialectical behavior therapy distress tolerance and mindfulness skills with the intention of reducing anxiety by 50% over the next 6 months. Progress: 30% Interventions: Cognitive Behavioral Therapy  Diagnosis:PTSD, Bi-Polar D/O  Plan: F/U/ appointments scheduled weekly.  [name],  [facility]  [facility] Counselor/Therapist Progress Note  Patient ID: Stephen Myers, MRN: [id number],    Date: 07/24/2023  Time Spent: 60 minutes spent in person with the patient. Treatment Type: Individual Therapy Reported Symptoms: anxiety, panic attacks The patient continues to struggle with short-term memory.  He is changing some of his providers in the area as he did not get results back after several weeks with minimal response.  He continues to be medication compliant.  He did spend some time with his son, daughter-in-law and grandson and says he is becoming more comfortable with his grandson.  He is also spending some time with his brothers over the next few days which he says is good for his anxiety.  His wife will be retiring next month and so they will be spending more time together.  We did talk more about his daughter and some of his feelings associated with her death.  He continues to grieve in a healthy way.  Lately he has been looking at some of her art work and recognizing the talent that he already knew she had but appreciates even more so now.  There have also been a couple of former coworkers who have reached out to connect to him which she has appreciated.  I encouraged continued use of his coping skills for anxiety reduction.  He continues to walk daily, fish at a private pond where no one else is when he can especially on Sundays when his wife is with him.  We will continue to work on coping skills for helping with his anxiety and the progression of his Lewy body dementia diagnosis as well as processing his grief. 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  Speech/Language:   Normal Rate  Affect: Appropriate  Mood: normal  Thought process: normal  Thought content:   WNL  Sensory/Perceptual disturbances:   WNL  Orientation: oriented to person, place, and situation  Attention: Good  Concentration: Good  Memory: Impaired short term  Fund of knowledge:  Good  Insight:   Good  Judgment:  Good  Impulse Control: Good   Risk Assessment: Danger to Self:  No Self-injurious Behavior: No Danger to Others: No Duty to Warn:no Physical Aggression / Violence:No  Access to Firearms a concern: No  Gang Involvement:No  Treatment plan: Will employ cognitive behavioral therapy as well as grief and loss therapy for relief of anxiety and grief.  Goals of grief therapy or to have a healthier response to the loss of his daughter and less sadness as evidenced by patient report in therapy notes as well as to help him feel less overwhelmed by her loss.  Goals are to see a 50% reduction in grief symptoms over the next 6 months.  I will encourage the use of the patient telling his grief story about his daughter including encouraging him to bring pictures and memorabilia to help process his feelings including any feelings of guilt associated with her loss.  We will use cognitive behavioral therapy to help identify and change anxiety producing thoughts and behavior patterns so as to improve his ability to better manage anxiety and stress, and manage thoughts and worrisome thinking contributing to anxiety.  We will also refresh and encourage dialectical behavior therapy distress tolerance and mindfulness skills with the intention of reducing anxiety by 50% over the next 6 months. Progress: 30% Interventions: Cognitive Behavioral Therapy  Diagnosis:PTSD, Bi-Polar D/O  Plan: F/U/ appointments scheduled weekly.  [name],  [facility]                                                                                     [facility] Counselor/Therapist Progress Note  Patient ID: Stephen Myers, MRN: [id number],    Date: 07/24/2023 This session was held via video teletherapy. The patient consented to the video teletherapy and was located in his home during this session. He is aware it is the responsibility of the patient to secure confidentiality on his end of the session. The provider was in a private home office for the duration of this session.    The patient arrived on time for her Caregility session  Time Spent: 57 minutes, 2:01 PM to 2:58 PM  Treatment Type: Individual Therapy Reported Symptoms: anxiety, panic attacks The patient's back and ankles are beginning to feel better and he has been able to walk at least part of the time over the last week.  He did get to physical therapy for the first time this week and feels it would be beneficial.  There is still some discomfort in his hands that he has had to use to push himself up but he is addressing that with the physical therapist also.  He saw a picture of his biological father recently which stirred some thoughts and feelings and emotions which we talked about during this session.  He said that he even felt some compassion for his father later in his father's life and he and 2 of his brothers were together when his father died.  Most of his father's adult life the patient was married and living in another state and he was not able to do as much for him as he liked and feels some guilt about that but knows that he was newly married with minimal financial resources and is thankful that one of his brothers was there to do a lot for his dad.  He feels in some way he was able to forgive.  He said he had been thinking a lot about the connection between Lewy body dementia and  bipolar in terms of shared symptoms and how it affects him.  He is noticing that he becomes confused a little more often and has to ask a few more questions and asked that I look into it so that we could talk about it more in the next session.  He does contract for safety having no thoughts of hurting himself or anyone else. 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  Speech/Language:  Normal Rate  Affect: Appropriate  Mood: normal  Thought process: normal  Thought content:   WNL  Sensory/Perceptual disturbances:   WNL  Orientation: oriented to person, place, and situation  Attention: Good  Concentration: Good  Memory: Impaired short term  Fund of knowledge:  Good  Insight:   Good  Judgment:  Good  Impulse Control: Good   Risk Assessment: Danger to Self:  No Self-injurious Behavior: No Danger to Others: No Duty to Warn:no Physical Aggression /  Violence:No  Access to Firearms a concern: No  Gang Involvement:No  Treatment plan: Will employ cognitive behavioral therapy as well as grief and loss therapy for relief of anxiety and grief.  Goals of grief therapy or to have a healthier response to the loss of his daughter and less sadness as evidenced by patient report in therapy notes as well as to help him feel less overwhelmed by her loss.  Goals are to see a 50% reduction in grief symptoms over the next 6 months.  I will encourage the use of the patient telling his grief story about his daughter including encouraging him to bring pictures and memorabilia to help process his feelings including any feelings of guilt associated with her loss.  We will use cognitive behavioral therapy to help identify and change anxiety producing thoughts and behavior patterns so as to improve his ability to better manage anxiety and stress, and manage thoughts and worrisome thinking contributing to anxiety.  We will  also refresh and encourage dialectical behavior therapy distress tolerance and mindfulness skills with the intention of reducing anxiety by 50% over the next 6 months. Progress: 30% Of the original target date was July 05, 2023.  I have reviewed the treatment goals and the patient would like to continue with the initial goals with the new target date of January 02, 2024. Diagnosis:PTSD, Bi-Polar D/O  Plan: F/U/ appointments scheduled weekly.  [name], [facility]                                                                                                                 [name], [facility]               [name], [facility]               [name], [facility]               [name], [facility]               [name], [facility]               [name], [facility]               [name], [facility]               [name], [facility]               [name], [facility]

## 2023-07-30 ENCOUNTER — Ambulatory Visit: Payer: Medicare Other | Admitting: Behavioral Health

## 2023-07-31 ENCOUNTER — Ambulatory Visit (INDEPENDENT_AMBULATORY_CARE_PROVIDER_SITE_OTHER): Payer: Medicare Other | Admitting: Behavioral Health

## 2023-07-31 ENCOUNTER — Encounter: Payer: Self-pay | Admitting: Behavioral Health

## 2023-07-31 DIAGNOSIS — F319 Bipolar disorder, unspecified: Secondary | ICD-10-CM

## 2023-07-31 DIAGNOSIS — F4310 Post-traumatic stress disorder, unspecified: Secondary | ICD-10-CM | POA: Diagnosis not present

## 2023-07-31 DIAGNOSIS — F3181 Bipolar II disorder: Secondary | ICD-10-CM

## 2023-07-31 NOTE — Progress Notes (Signed)
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 Symptoms: anxiety, panic attacks The patient reports that he has been struggling with some of the same thoughts continually over the past few weeks.   Some of them are thoughts that he has dealt with in the past and has not necessarily gotten resolution or  answers for but he says they continue to be something he thinks about and they have affected his quality of life and his sleep recently.  We looked at the root cause behind some of these thoughts and why they are being continued and the anxiety that they are creating.  We looked at ways to try to get these thoughts answered in some way and the best way to present those questions but we also looked at what the patient does with those answers if they are not what he expects or if he does not get a clear answer at all.  We talked about the importance of coping with some of the feelings associated with these thoughts but also how to cognitively reframe or challenge these thoughts if necessary. We will continue to work on coping skills for helping with his anxiety and the progression of his Lewy body dementia diagnosis as well as processing his grief. 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  Speech/Language:  Normal Rate  Affect: Appropriate  Mood: normal  Thought process: normal  Thought content:   WNL  Sensory/Perceptual disturbances:   WNL  Orientation: oriented to person, place, and situation  Attention: Good  Concentration: Good  Memory: Impaired short term  Fund of knowledge:  Good  Insight:   Good  Judgment:  Good  Impulse Control: Good   Risk Assessment: Danger to Self:  No Self-injurious Behavior: No Danger to Others: No Duty to Warn:no Physical Aggression / Violence:No  Access to Firearms a concern: No  Gang Involvement:No  Treatment plan: Will employ cognitive behavioral therapy as well as grief and loss therapy for relief of anxiety and grief.  Goals of grief therapy or to have a healthier response to the loss of his daughter and less sadness as evidenced by patient report in  therapy notes as well as to help him feel less overwhelmed by her loss.  Goals are to see a 50% reduction in grief symptoms over the next 6 months.  I will encourage the use of the patient telling his grief story about his daughter including encouraging him to bring pictures and memorabilia to help process his feelings including any feelings of guilt associated with her loss.  We will use cognitive behavioral therapy to help identify and change anxiety producing thoughts and behavior patterns so as to improve his ability to better manage anxiety and stress, and manage thoughts and worrisome thinking contributing to anxiety.  We will also refresh and encourage dialectical behavior therapy distress tolerance and mindfulness skills with the intention of reducing anxiety by 50% over the next 6 months. Progress: 30% Interventions: Cognitive Behavioral Therapy  Diagnosis:PTSD, Bi-Polar D/O  Plan: F/U/ appointments scheduled weekly.  [name], [facility]                                                                                                      Behavioral Health Counselor/Therapist Progress Note  Patient ID: Stephen Myers, MRN: [id number],  Date: 07/31/2023  Time Spent: 60 minutes spent in person with the patient. Treatment Type: Individual Therapy Reported Symptoms: anxiety, panic attacks The patient continues to struggle with short-term memory.  He is changing some of his providers in the area as he did not get results back after several weeks with minimal response.  He continues to be medication compliant.  He did spend some time with his son, daughter-in-law and grandson and says he is becoming more comfortable with his grandson.  He is also spending some time with his brothers over the next few days which he says is good for his anxiety.  His wife will be retiring next month and so  they will be spending more time together.  We did talk more about his daughter and some of his feelings associated with her death.  He continues to grieve in a healthy way.  Lately he has been looking at some of her art work and recognizing the talent that he already knew she had but appreciates even more so now.  There have also been a couple of former coworkers who have reached out to connect to him which she has appreciated.  I encouraged continued use of his coping skills for anxiety reduction.  He continues to walk daily, fish at a private pond where no one else is when he can especially on Sundays when his wife is with him.  We will continue to work on coping skills for helping with his anxiety and the progression of his Lewy body dementia diagnosis as well as processing his grief. 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  Speech/Language:  Normal Rate  Affect: Appropriate  Mood: normal  Thought process: normal  Thought content:   WNL  Sensory/Perceptual disturbances:   WNL  Orientation: oriented to person, place, and situation  Attention: Good  Concentration: Good  Memory: Impaired short term  Fund of knowledge:  Good  Insight:   Good  Judgment:  Good  Impulse Control: Good   Risk Assessment: Danger to Self:  No Self-injurious Behavior: No Danger to Others: No Duty to Warn:no Physical Aggression / Violence:No  Access to Firearms a concern: No  Gang Involvement:No  Treatment plan: Will employ cognitive behavioral therapy as well as grief and loss therapy for relief of anxiety and grief.  Goals of grief therapy or to have a healthier response to the loss of his daughter and less sadness as evidenced by patient report in therapy notes as well as to help him feel less overwhelmed by her loss.  Goals are to see a 50% reduction in grief symptoms over the next 6 months.  I will encourage the  use of the patient telling his grief story about his daughter including encouraging him to bring pictures and memorabilia to help process his feelings including any feelings of guilt associated with her loss.  We will use cognitive behavioral therapy to help identify and change anxiety producing thoughts and behavior patterns so as to improve his ability to better manage anxiety and stress, and manage thoughts and worrisome thinking contributing to anxiety.  We will also refresh and encourage dialectical behavior therapy distress tolerance and mindfulness skills with the intention of reducing anxiety by 50% over the next 6 months. Progress: 30% Interventions: Cognitive Behavioral Therapy  Diagnosis:PTSD, Bi-Polar D/O  Plan: F/U/ appointments scheduled weekly.  [name], [facility]  [place] [facility] Counselor/Therapist Progress Note  Patient ID: Stephen Myers, MRN: [id number],    Date: 07/31/2023  Time Spent: 60 minutes spent in person with the patient. Treatment Type: Individual Therapy Reported Symptoms: anxiety, panic attacks The patient continues to struggle with short-term memory.  He is changing some of his providers in the area as he did not get results back after several weeks with minimal response.  He continues to be medication compliant.  He did spend some time with his son, daughter-in-law and grandson and says he is becoming more comfortable with his grandson.  He is also spending some time with his brothers over the next few days which he says is good for his anxiety.  His wife will be retiring next month and so they will be spending more time together.  We did talk more about his daughter and some of his feelings associated with her death.  He continues to grieve in a healthy  way.  Lately he has been looking at some of her art work and recognizing the talent that he already knew she had but appreciates even more so now.  There have also been a couple of former coworkers who have reached out to connect to him which she has appreciated.  I encouraged continued use of his coping skills for anxiety reduction.  He continues to walk daily, fish at a private pond where no one else is when he can especially on Sundays when his wife is with him.  We will continue to work on coping skills for helping with his anxiety and the progression of his Lewy body dementia diagnosis as well as processing his grief. 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  Speech/Language:  Normal Rate  Affect: Appropriate  Mood: normal  Thought process: normal  Thought content:   WNL  Sensory/Perceptual disturbances:   WNL  Orientation: oriented to person, place, and situation  Attention: Good  Concentration: Good  Memory: Impaired short term  Fund of knowledge:  Good  Insight:   Good  Judgment:  Good  Impulse Control: Good   Risk Assessment: Danger to Self:  No Self-injurious Behavior: No Danger to Others: No Duty to Warn:no Physical Aggression / Violence:No  Access to Firearms a concern: No  Gang Involvement:No  Treatment plan: Will employ cognitive behavioral therapy as well as grief and loss therapy for relief of anxiety and grief.  Goals of grief therapy or to have a healthier response to the loss of his daughter and less sadness as evidenced by patient report in therapy notes as well as to help him feel less overwhelmed by her loss.  Goals are to see a 50% reduction in grief symptoms over the next 6 months.  I will encourage the use of the patient telling his grief story about his daughter including encouraging him to bring pictures and memorabilia to help process his feelings including any  feelings of guilt associated with her loss.  We will use cognitive behavioral therapy to help identify and change anxiety producing thoughts and behavior patterns so as to improve his ability to better manage anxiety and stress, and manage thoughts and worrisome thinking contributing to anxiety.  We will also refresh and encourage dialectical behavior therapy distress tolerance and mindfulness skills with the intention of reducing anxiety by 50% over the next 6 months. Progress: 30% Interventions: Cognitive Behavioral Therapy  Diagnosis:PTSD, Bi-Polar  D/O  Plan: F/U/ appointments scheduled weekly.  [name], [facility]                                                                                                                  [name], [facility]  [place] [facility] Counselor/Therapist Progress Note  Patient ID: Stephen Myers, MRN: [id number],    Date: 07/31/2023  Time Spent: 60 minutes spent via video session. The pt. Was at ome and this therapist was in his home office. Treatment Type: Individual Therapy Reported Symptoms: anxiety, panic attacks The patient reports that he has been struggling with some of the same thoughts continually over the past few weeks.  Some of them are thoughts that he has dealt with in the past and has not necessarily gotten resolution or answers for but he says they continue to be something he thinks about and they have affected his quality of life and his sleep recently.  We looked at the root cause behind some of these thoughts and why they are being continued and the anxiety that they are creating.  We looked at ways to try to get these thoughts answered in some way and the best way to present those questions but we also looked at what the patient does with those answers if they are not what he expects or if he does not get a clear answer  at all.  We talked about the importance of coping with some of the feelings associated with these thoughts but also how to cognitively reframe or challenge these thoughts if necessary. We will continue to work on coping skills for helping with his anxiety and the progression of his Lewy body dementia diagnosis as well as processing his grief. 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  Speech/Language:  Normal Rate  Affect: Appropriate  Mood: normal  Thought process: normal  Thought content:   WNL  Sensory/Perceptual disturbances:   WNL  Orientation: oriented to person, place, and situation  Attention: Good  Concentration: Good  Memory: Impaired short term  Fund of knowledge:  Good  Insight:   Good  Judgment:  Good  Impulse Control: Good   Risk Assessment: Danger to Self:  No Self-injurious Behavior: No Danger to Others: No Duty to Warn:no Physical Aggression / Violence:No  Access to Firearms a concern: No  Gang Involvement:No  Treatment plan: Will employ cognitive behavioral therapy as well as grief and loss therapy for relief of anxiety and grief.  Goals of grief therapy or to have a healthier response to the loss of his daughter and less sadness as evidenced by patient report in therapy notes as well as to help him feel less overwhelmed by her loss.  Goals are to see a 50% reduction in grief symptoms over the next 6 months.  I will encourage the use of the patient telling his grief story about his daughter including encouraging him  to bring pictures and memorabilia to help process his feelings including any feelings of guilt associated with her loss.  We will use cognitive behavioral therapy to help identify and change anxiety producing thoughts and behavior patterns so as to improve his ability to better manage anxiety and stress, and manage thoughts and worrisome thinking contributing to  anxiety.  We will also refresh and encourage dialectical behavior therapy distress tolerance and mindfulness skills with the intention of reducing anxiety by 50% over the next 6 months. Progress: 30% Interventions: Cognitive Behavioral Therapy  Diagnosis:PTSD, Bi-Polar D/O  Plan: F/U/ appointments scheduled weekly.  [name], [facility]                                                                                                     [place] [facility] Counselor/Therapist Progress Note  Patient ID: Stephen Myers, MRN: [id number],    Date: 07/31/2023  Time Spent: 60 minutes spent in person with the patient. Treatment Type: Individual Therapy Reported Symptoms: anxiety, panic attacks The patient continues to struggle with short-term memory.  He is changing some of his providers in the area as he did not get results back after several weeks with minimal response.  He continues to be medication compliant.  He did spend some time with his son, daughter-in-law and grandson and says he is becoming more comfortable with his grandson.  He is also spending some time with his brothers over the next few days which he says is good for his anxiety.  His wife will be retiring next month and so they will be spending more time together.  We did talk more about his daughter and some of his feelings associated with her death.  He continues to grieve in a healthy way.  Lately he has been looking at some of her art work and recognizing the talent that he already knew she had but appreciates even more so now.  There have also been a couple of former coworkers who have reached out to connect to him which she has appreciated.  I encouraged continued use of his coping skills for anxiety reduction.  He continues to walk daily, fish at a private pond where no one else is when he can especially on Sundays when his  wife is with him.  We will continue to work on coping skills for helping with his anxiety and the progression of his Lewy body dementia diagnosis as well as processing his grief. 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  Speech/Language:  Normal Rate  Affect: Appropriate  Mood: normal  Thought process: normal  Thought content:   WNL  Sensory/Perceptual disturbances:   WNL  Orientation: oriented to person, place, and situation  Attention: Good  Concentration: Good  Memory: Impaired short term  Fund of knowledge:  Good  Insight:   Good  Judgment:  Good  Impulse Control: Good   Risk Assessment: Danger to Self:  No Self-injurious Behavior: No Danger to Others: No Duty  to Warn:no Physical Aggression / Violence:No  Access to Firearms a concern: No  Gang Involvement:No  Treatment plan: Will employ cognitive behavioral therapy as well as grief and loss therapy for relief of anxiety and grief.  Goals of grief therapy or to have a healthier response to the loss of his daughter and less sadness as evidenced by patient report in therapy notes as well as to help him feel less overwhelmed by her loss.  Goals are to see a 50% reduction in grief symptoms over the next 6 months.  I will encourage the use of the patient telling his grief story about his daughter including encouraging him to bring pictures and memorabilia to help process his feelings including any feelings of guilt associated with her loss.  We will use cognitive behavioral therapy to help identify and change anxiety producing thoughts and behavior patterns so as to improve his ability to better manage anxiety and stress, and manage thoughts and worrisome thinking contributing to anxiety.  We will also refresh and encourage dialectical behavior therapy distress tolerance and mindfulness skills with the intention of reducing anxiety by 50% over  the next 6 months. Progress: 30% Interventions: Cognitive Behavioral Therapy  Diagnosis:PTSD, Bi-Polar D/O  Plan: F/U/ appointments scheduled weekly.  [name], [facility]                                                                                     [place] [facility] Counselor/Therapist Progress Note  Patient ID: Stephen Myers, MRN: [id number],    Date: 07/31/2023 This session was held via video teletherapy. The patient consented to the video teletherapy and was located in his home during this session. He is aware it is the responsibility of the patient to secure confidentiality on his end of the session. The provider was in a private home office for the duration of this session.    The patient arrived on time for her Caregility session  Time Spent: 57 minutes, 2:01 PM to 2:58 PM  Treatment Type: Individual Therapy Reported Symptoms: anxiety, panic attacks  He does contract for safety having no thoughts of hurting himself or anyone else. 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  Speech/Language:  Normal Rate  Affect: Appropriate  Mood: normal  Thought process: normal  Thought content:   WNL  Sensory/Perceptual disturbances:   WNL  Orientation: oriented to person, place, and situation  Attention: Good  Concentration: Good  Memory: Impaired short term  Fund of knowledge:  Good  Insight:   Good  Judgment:  Good  Impulse Control: Good   Risk Assessment: Danger to Self:  No Self-injurious Behavior: No Danger to Others: No Duty to Warn:no Physical Aggression / Violence:No  Access to Firearms a concern: No  Gang Involvement:No  Treatment plan: Will employ cognitive behavioral therapy as well as grief and loss therapy for relief of anxiety and grief.  Goals of grief therapy or to  have a healthier response to the loss of his daughter and less sadness as evidenced by patient report in therapy notes as well  as to help him feel less overwhelmed by her loss.  Goals are to see a 50% reduction in grief symptoms over the next 6 months.  I will encourage the use of the patient telling his grief story about his daughter including encouraging him to bring pictures and memorabilia to help process his feelings including any feelings of guilt associated with her loss.  We will use cognitive behavioral therapy to help identify and change anxiety producing thoughts and behavior patterns so as to improve his ability to better manage anxiety and stress, and manage thoughts and worrisome thinking contributing to anxiety.  We will also refresh and encourage dialectical behavior therapy distress tolerance and mindfulness skills with the intention of reducing anxiety by 50% over the next 6 months. Progress: 30% Of the original target date was July 05, 2023.  I have reviewed the treatment goals and the patient would like to continue with the initial goals with the new target date of January 02, 2024. Diagnosis:PTSD, Bi-Polar D/O  Plan: F/U/ appointments scheduled weekly.  [name], [facility]                                                                                                                 [name], [facility]               [name], [facility]               [name], [facility]               [name], [facility]               [name], [facility]               [name], [facility]               [name], [facility]               [name], [facility]               [name], [facility]               [name], [facility]

## 2023-08-07 ENCOUNTER — Ambulatory Visit: Payer: Medicare Other | Admitting: Behavioral Health

## 2023-08-07 DIAGNOSIS — F3181 Bipolar II disorder: Secondary | ICD-10-CM

## 2023-08-07 DIAGNOSIS — F4321 Adjustment disorder with depressed mood: Secondary | ICD-10-CM

## 2023-08-07 DIAGNOSIS — F4310 Post-traumatic stress disorder, unspecified: Secondary | ICD-10-CM

## 2023-08-07 DIAGNOSIS — F319 Bipolar disorder, unspecified: Secondary | ICD-10-CM | POA: Diagnosis not present

## 2023-08-07 NOTE — Progress Notes (Signed)
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 thoughts continually over the past few weeks.   Some of them are thoughts that he has dealt with in the past and has not necessarily gotten resolution or  answers for but he says they continue to be something he thinks about and they have affected his quality of life and his sleep recently.  We looked at the root cause behind some of these thoughts and why they are being continued and the anxiety that they are creating.  We looked at ways to try to get these thoughts answered in some way and the best way to present those questions but we also looked at what the patient does with those answers if they are not what he expects or if he does not get a clear answer at all.  We talked about the importance of coping with some of the feelings associated with these thoughts but also how to cognitively reframe or challenge these thoughts if necessary. We will continue to work on coping skills for helping with his anxiety and the progression of his Lewy body dementia diagnosis as well as processing his grief. 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  Speech/Language:  Normal Rate  Affect: Appropriate  Mood: normal  Thought process: normal  Thought content:   WNL  Sensory/Perceptual disturbances:   WNL  Orientation: oriented to person, place, and situation  Attention: Good  Concentration: Good  Memory: Impaired short term  Fund of knowledge:  Good  Insight:   Good  Judgment:  Good  Impulse Control: Good   Risk Assessment: Danger to Self:  No Self-injurious Behavior: No Danger to Others: No Duty to Warn:no Physical Aggression / Violence:No  Access to Firearms a concern: No  Gang Involvement:No  Treatment plan: Will employ cognitive behavioral therapy as well as grief and loss therapy for relief of anxiety and grief.  Goals of grief therapy or to have a healthier response to the loss of his daughter and less sadness as evidenced by patient report in  therapy notes as well as to help him feel less overwhelmed by her loss.  Goals are to see a 50% reduction in grief symptoms over the next 6 months.  I will encourage the use of the patient telling his grief story about his daughter including encouraging him to bring pictures and memorabilia to help process his feelings including any feelings of guilt associated with her loss.  We will use cognitive behavioral therapy to help identify and change anxiety producing thoughts and behavior patterns so as to improve his ability to better manage anxiety and stress, and manage thoughts and worrisome thinking contributing to anxiety.  We will also refresh and encourage dialectical behavior therapy distress tolerance and mindfulness skills with the intention of reducing anxiety by 50% over the next 6 months. Progress: 30% Interventions: Cognitive Behavioral Therapy  Diagnosis:PTSD, Bi-Polar D/O  Plan: F/U/ appointments scheduled weekly.  [name], [facility]                                                                                                     [facility] Counselor/Therapist Progress Note  Patient ID: Stephen Myers, MRN: [id number],  Date: 08/07/2023  Time Spent: 60 minutes spent in person with the patient. Treatment Type: Individual Therapy Reported Symptoms: anxiety, panic attacks The patient continues to struggle with short-term memory.  He is changing some of his providers in the area as he did not get results back after several weeks with minimal response.  He continues to be medication compliant.  He did spend some time with his son, daughter-in-law and grandson and says he is becoming more comfortable with his grandson.  He is also spending some time with his brothers over the next few days which he says is good for his anxiety.  His wife will be retiring next month and so  they will be spending more time together.  We did talk more about his daughter and some of his feelings associated with her death.  He continues to grieve in a healthy way.  Lately he has been looking at some of her art work and recognizing the talent that he already knew she had but appreciates even more so now.  There have also been a couple of former coworkers who have reached out to connect to him which she has appreciated.  I encouraged continued use of his coping skills for anxiety reduction.  He continues to walk daily, fish at a private pond where no one else is when he can especially on Sundays when his wife is with him.  We will continue to work on coping skills for helping with his anxiety and the progression of his Lewy body dementia diagnosis as well as processing his grief. 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  Speech/Language:  Normal Rate  Affect: Appropriate  Mood: normal  Thought process: normal  Thought content:   WNL  Sensory/Perceptual disturbances:   WNL  Orientation: oriented to person, place, and situation  Attention: Good  Concentration: Good  Memory: Impaired short term  Fund of knowledge:  Good  Insight:   Good  Judgment:  Good  Impulse Control: Good   Risk Assessment: Danger to Self:  No Self-injurious Behavior: No Danger to Others: No Duty to Warn:no Physical Aggression / Violence:No  Access to Firearms a concern: No  Gang Involvement:No  Treatment plan: Will employ cognitive behavioral therapy as well as grief and loss therapy for relief of anxiety and grief.  Goals of grief therapy or to have a healthier response to the loss of his daughter and less sadness as evidenced by patient report in therapy notes as well as to help him feel less overwhelmed by her loss.  Goals are to see a 50% reduction in grief symptoms over the next 6 months.  I will encourage the  use of the patient telling his grief story about his daughter including encouraging him to bring pictures and memorabilia to help process his feelings including any feelings of guilt associated with her loss.  We will use cognitive behavioral therapy to help identify and change anxiety producing thoughts and behavior patterns so as to improve his ability to better manage anxiety and stress, and manage thoughts and worrisome thinking contributing to anxiety.  We will also refresh and encourage dialectical behavior therapy distress tolerance and mindfulness skills with the intention of reducing anxiety by 50% over the next 6 months. Progress: 30% Interventions: Cognitive Behavioral Therapy  Diagnosis:PTSD, Bi-Polar D/O  Plan: F/U/ appointments scheduled weekly.  [name], [facility]  [facility] Counselor/Therapist Progress Note  Patient ID: Stephen Myers, MRN: [id number],    Date: 08/07/2023  Time Spent: 60 minutes spent in person with the patient. Treatment Type: Individual Therapy Reported Symptoms: anxiety, panic attacks The patient continues to struggle with short-term memory.  He is changing some of his providers in the area as he did not get results back after several weeks with minimal response.  He continues to be medication compliant.  He did spend some time with his son, daughter-in-law and grandson and says he is becoming more comfortable with his grandson.  He is also spending some time with his brothers over the next few days which he says is good for his anxiety.  His wife will be retiring next month and so they will be spending more time together.  We did talk more about his daughter and some of his feelings associated with her death.  He continues to grieve in a healthy  way.  Lately he has been looking at some of her art work and recognizing the talent that he already knew she had but appreciates even more so now.  There have also been a couple of former coworkers who have reached out to connect to him which she has appreciated.  I encouraged continued use of his coping skills for anxiety reduction.  He continues to walk daily, fish at a private pond where no one else is when he can especially on Sundays when his wife is with him.  We will continue to work on coping skills for helping with his anxiety and the progression of his Lewy body dementia diagnosis as well as processing his grief. 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  Speech/Language:  Normal Rate  Affect: Appropriate  Mood: normal  Thought process: normal  Thought content:   WNL  Sensory/Perceptual disturbances:   WNL  Orientation: oriented to person, place, and situation  Attention: Good  Concentration: Good  Memory: Impaired short term  Fund of knowledge:  Good  Insight:   Good  Judgment:  Good  Impulse Control: Good   Risk Assessment: Danger to Self:  No Self-injurious Behavior: No Danger to Others: No Duty to Warn:no Physical Aggression / Violence:No  Access to Firearms a concern: No  Gang Involvement:No  Treatment plan: Will employ cognitive behavioral therapy as well as grief and loss therapy for relief of anxiety and grief.  Goals of grief therapy or to have a healthier response to the loss of his daughter and less sadness as evidenced by patient report in therapy notes as well as to help him feel less overwhelmed by her loss.  Goals are to see a 50% reduction in grief symptoms over the next 6 months.  I will encourage the use of the patient telling his grief story about his daughter including encouraging him to bring pictures and memorabilia to help process his feelings including any  feelings of guilt associated with her loss.  We will use cognitive behavioral therapy to help identify and change anxiety producing thoughts and behavior patterns so as to improve his ability to better manage anxiety and stress, and manage thoughts and worrisome thinking contributing to anxiety.  We will also refresh and encourage dialectical behavior therapy distress tolerance and mindfulness skills with the intention of reducing anxiety by 50% over the next 6 months. Progress: 30% Interventions: Cognitive Behavioral Therapy  Diagnosis:PTSD, Bi-Polar  D/O  Plan: F/U/ appointments scheduled weekly.  [name], [facility]                                                                                                                  [name], [facility]  [facility] Counselor/Therapist Progress Note  Patient ID: Stephen Myers, MRN: [id number],    Date: 08/07/2023  Time Spent: 60 minutes spent via video session. The pt. Was at ome and this therapist was in his home office. Treatment Type: Individual Therapy Reported Symptoms: anxiety, panic attacks The patient reports that he has been struggling with some of the same thoughts continually over the past few weeks.  Some of them are thoughts that he has dealt with in the past and has not necessarily gotten resolution or answers for but he says they continue to be something he thinks about and they have affected his quality of life and his sleep recently.  We looked at the root cause behind some of these thoughts and why they are being continued and the anxiety that they are creating.  We looked at ways to try to get these thoughts answered in some way and the best way to present those questions but we also looked at what the patient does with those answers if they are not what he expects or if he does not get a clear answer  at all.  We talked about the importance of coping with some of the feelings associated with these thoughts but also how to cognitively reframe or challenge these thoughts if necessary. We will continue to work on coping skills for helping with his anxiety and the progression of his Lewy body dementia diagnosis as well as processing his grief. 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  Speech/Language:  Normal Rate  Affect: Appropriate  Mood: normal  Thought process: normal  Thought content:   WNL  Sensory/Perceptual disturbances:   WNL  Orientation: oriented to person, place, and situation  Attention: Good  Concentration: Good  Memory: Impaired short term  Fund of knowledge:  Good  Insight:   Good  Judgment:  Good  Impulse Control: Good   Risk Assessment: Danger to Self:  No Self-injurious Behavior: No Danger to Others: No Duty to Warn:no Physical Aggression / Violence:No  Access to Firearms a concern: No  Gang Involvement:No  Treatment plan: Will employ cognitive behavioral therapy as well as grief and loss therapy for relief of anxiety and grief.  Goals of grief therapy or to have a healthier response to the loss of his daughter and less sadness as evidenced by patient report in therapy notes as well as to help him feel less overwhelmed by her loss.  Goals are to see a 50% reduction in grief symptoms over the next 6 months.  I will encourage the use of the patient telling his grief story about his daughter including encouraging him  to bring pictures and memorabilia to help process his feelings including any feelings of guilt associated with her loss.  We will use cognitive behavioral therapy to help identify and change anxiety producing thoughts and behavior patterns so as to improve his ability to better manage anxiety and stress, and manage thoughts and worrisome thinking contributing to  anxiety.  We will also refresh and encourage dialectical behavior therapy distress tolerance and mindfulness skills with the intention of reducing anxiety by 50% over the next 6 months. Progress: 30% Interventions: Cognitive Behavioral Therapy  Diagnosis:PTSD, Bi-Polar D/O  Plan: F/U/ appointments scheduled weekly.  [name], [facility]                                                                                                     [facility] Counselor/Therapist Progress Note  Patient ID: Stephen Myers, MRN: [id number],    Date: 08/07/2023  Time Spent: 60 minutes spent in person with the patient. Treatment Type: Individual Therapy Reported Symptoms: anxiety, panic attacks The patient continues to struggle with short-term memory.  He is changing some of his providers in the area as he did not get results back after several weeks with minimal response.  He continues to be medication compliant.  He did spend some time with his son, daughter-in-law and grandson and says he is becoming more comfortable with his grandson.  He is also spending some time with his brothers over the next few days which he says is good for his anxiety.  His wife will be retiring next month and so they will be spending more time together.  We did talk more about his daughter and some of his feelings associated with her death.  He continues to grieve in a healthy way.  Lately he has been looking at some of her art work and recognizing the talent that he already knew she had but appreciates even more so now.  There have also been a couple of former coworkers who have reached out to connect to him which she has appreciated.  I encouraged continued use of his coping skills for anxiety reduction.  He continues to walk daily, fish at a private pond where no one else is when he can especially on Sundays when his  wife is with him.  We will continue to work on coping skills for helping with his anxiety and the progression of his Lewy body dementia diagnosis as well as processing his grief. 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  Speech/Language:  Normal Rate  Affect: Appropriate  Mood: normal  Thought process: normal  Thought content:   WNL  Sensory/Perceptual disturbances:   WNL  Orientation: oriented to person, place, and situation  Attention: Good  Concentration: Good  Memory: Impaired short term  Fund of knowledge:  Good  Insight:   Good  Judgment:  Good  Impulse Control: Good   Risk Assessment: Danger to Self:  No Self-injurious Behavior: No Danger to Others: No Duty  to Warn:no Physical Aggression / Violence:No  Access to Firearms a concern: No  Gang Involvement:No  Treatment plan: Will employ cognitive behavioral therapy as well as grief and loss therapy for relief of anxiety and grief.  Goals of grief therapy or to have a healthier response to the loss of his daughter and less sadness as evidenced by patient report in therapy notes as well as to help him feel less overwhelmed by her loss.  Goals are to see a 50% reduction in grief symptoms over the next 6 months.  I will encourage the use of the patient telling his grief story about his daughter including encouraging him to bring pictures and memorabilia to help process his feelings including any feelings of guilt associated with her loss.  We will use cognitive behavioral therapy to help identify and change anxiety producing thoughts and behavior patterns so as to improve his ability to better manage anxiety and stress, and manage thoughts and worrisome thinking contributing to anxiety.  We will also refresh and encourage dialectical behavior therapy distress tolerance and mindfulness skills with the intention of reducing anxiety by 50% over  the next 6 months. Progress: 30% Interventions: Cognitive Behavioral Therapy  Diagnosis:PTSD, Bi-Polar D/O  Plan: F/U/ appointments scheduled weekly.  [name], [facility]                                                                                     [facility] Counselor/Therapist Progress Note  Patient ID: Stephen Myers, MRN: [id number],    Date: 08/07/2023 This session was held via video teletherapy. The patient consented to the video teletherapy and was located in his home during this session. He is aware it is the responsibility of the patient to secure confidentiality on his end of the session. The provider was in a private home office for the duration of this session.    The patient arrived on time for her Caregility session  Time Spent: 57 minutes, 2:01 PM to 2:58 PM  Treatment Type: Individual Therapy Reported Symptoms: anxiety, panic attacks  He does contract for safety having no thoughts of hurting himself or anyone else. 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  Speech/Language:  Normal Rate  Affect: Appropriate  Mood: normal  Thought process: normal  Thought content:   WNL  Sensory/Perceptual disturbances:   WNL  Orientation: oriented to person, place, and situation  Attention: Good  Concentration: Good  Memory: Impaired short term  Fund of knowledge:  Good  Insight:   Good  Judgment:  Good  Impulse Control: Good   Risk Assessment: Danger to Self:  No Self-injurious Behavior: No Danger to Others: No Duty to Warn:no Physical Aggression / Violence:No  Access to Firearms a concern: No  Gang Involvement:No  Treatment plan: Will employ cognitive behavioral therapy as well as grief and loss therapy for relief of anxiety and grief.  Goals of grief therapy or to  have a healthier response to the loss of his daughter and less sadness as evidenced by patient report in therapy notes as well  as to help him feel less overwhelmed by her loss.  Goals are to see a 50% reduction in grief symptoms over the next 6 months.  I will encourage the use of the patient telling his grief story about his daughter including encouraging him to bring pictures and memorabilia to help process his feelings including any feelings of guilt associated with her loss.  We will use cognitive behavioral therapy to help identify and change anxiety producing thoughts and behavior patterns so as to improve his ability to better manage anxiety and stress, and manage thoughts and worrisome thinking contributing to anxiety.  We will also refresh and encourage dialectical behavior therapy distress tolerance and mindfulness skills with the intention of reducing anxiety by 50% over the next 6 months. Progress: 30% Of the original target date was July 05, 2023.  I have reviewed the treatment goals and the patient would like to continue with the initial goals with the new target date of January 02, 2024. Diagnosis:PTSD, Bi-Polar D/O  Plan: F/U/ appointments scheduled weekly.  [name], [facility]                                                                                                                 [name], [facility]               [name], [facility]               [name], [facility]               [name], [facility]               [name], [facility]               [name], [facility]               [name], [facility]               [name], [facility]               [name], [facility]               [name],  [facility]               [name], [facility]

## 2023-08-14 ENCOUNTER — Encounter: Payer: Self-pay | Admitting: Behavioral Health

## 2023-08-14 ENCOUNTER — Ambulatory Visit: Payer: Medicare Other | Admitting: Behavioral Health

## 2023-08-14 DIAGNOSIS — F319 Bipolar disorder, unspecified: Secondary | ICD-10-CM

## 2023-08-14 DIAGNOSIS — F3181 Bipolar II disorder: Secondary | ICD-10-CM

## 2023-08-14 DIAGNOSIS — F4310 Post-traumatic stress disorder, unspecified: Secondary | ICD-10-CM

## 2023-08-14 NOTE — Progress Notes (Addendum)
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 some of the same thoughts continually over the past few weeks.   Some of them are thoughts that he has dealt with in the past and has not necessarily gotten resolution or  answers for but he says they continue to be something he thinks about and they have affected his quality of life and his sleep recently.  We looked at the root cause behind some of these thoughts and why they are being continued and the anxiety that they are creating.  We looked at ways to try to get these thoughts answered in some way and the best way to present those questions but we also looked at what the patient does with those answers if they are not what he expects or if he does not get a clear answer at all.  We talked about the importance of coping with some of the feelings associated with these thoughts but also how to cognitively reframe or challenge these thoughts if necessary. We will continue to work on coping skills for helping with his anxiety and the progression of his Lewy body dementia diagnosis as well as processing his grief. 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  Speech/Language:  Normal Rate  Affect: Appropriate  Mood: normal  Thought process: normal  Thought content:   WNL  Sensory/Perceptual disturbances:   WNL  Orientation: oriented to person, place, and situation  Attention: Good  Concentration: Good  Memory: Impaired short term  Fund of knowledge:  Good  Insight:   Good  Judgment:  Good  Impulse Control: Good   Risk Assessment: Danger to Self:  No Self-injurious Behavior: No Danger to Others: No Duty to Warn:no Physical Aggression / Violence:No  Access to Firearms a concern: No  Gang Involvement:No  Treatment plan: Will employ cognitive behavioral therapy as well as grief and loss therapy for relief of anxiety and grief.  Goals of grief therapy or to have a healthier response to the loss of his daughter and less sadness as evidenced by patient report in  therapy notes as well as to help him feel less overwhelmed by her loss.  Goals are to see a 50% reduction in grief symptoms over the next 6 months.  I will encourage the use of the patient telling his grief story about his daughter including encouraging him to bring pictures and memorabilia to help process his feelings including any feelings of guilt associated with her loss.  We will use cognitive behavioral therapy to help identify and change anxiety producing thoughts and behavior patterns so as to improve his ability to better manage anxiety and stress, and manage thoughts and worrisome thinking contributing to anxiety.  We will also refresh and encourage dialectical behavior therapy distress tolerance and mindfulness skills with the intention of reducing anxiety by 50% over the next 6 months. Progress: 30% Interventions: Cognitive Behavioral Therapy  Diagnosis:PTSD, Bi-Polar D/O  Plan: F/U/ appointments scheduled weekly.  [name], [facility]                                                                                                     [place] [facility] Counselor/Therapist Progress Note  Patient ID: Stephen Myers, MRN: [id number],  Date: 08/14/2023  Time Spent: 60 minutes spent in person with the patient. Treatment Type: Individual Therapy Reported Symptoms: anxiety, panic attacks The patient continues to struggle with short-term memory.  He is changing some of his providers in the area as he did not get results back after several weeks with minimal response.  He continues to be medication compliant.  He did spend some time with his son, daughter-in-law and grandson and says he is becoming more comfortable with his grandson.  He is also spending some time with his brothers over the next few days which he says is good for his anxiety.  His wife will be retiring next month and  so they will be spending more time together.  We did talk more about his daughter and some of his feelings associated with her death.  He continues to grieve in a healthy way.  Lately he has been looking at some of her art work and recognizing the talent that he already knew she had but appreciates even more so now.  There have also been a couple of former coworkers who have reached out to connect to him which she has appreciated.  I encouraged continued use of his coping skills for anxiety reduction.  He continues to walk daily, fish at a private pond where no one else is when he can especially on Sundays when his wife is with him.  We will continue to work on coping skills for helping with his anxiety and the progression of his Lewy body dementia diagnosis as well as processing his grief. 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  Speech/Language:  Normal Rate  Affect: Appropriate  Mood: normal  Thought process: normal  Thought content:   WNL  Sensory/Perceptual disturbances:   WNL  Orientation: oriented to person, place, and situation  Attention: Good  Concentration: Good  Memory: Impaired short term  Fund of knowledge:  Good  Insight:   Good  Judgment:  Good  Impulse Control: Good   Risk Assessment: Danger to Self:  No Self-injurious Behavior: No Danger to Others: No Duty to Warn:no Physical Aggression / Violence:No  Access to Firearms a concern: No  Gang Involvement:No  Treatment plan: Will employ cognitive behavioral therapy as well as grief and loss therapy for relief of anxiety and grief.  Goals of grief therapy or to have a healthier response to the loss of his daughter and less sadness as evidenced by patient report in therapy notes as well as to help him feel less overwhelmed by her loss.  Goals are to see a 50% reduction in grief symptoms over the next 6 months.  I will encourage  the use of the patient telling his grief story about his daughter including encouraging him to bring pictures and memorabilia to help process his feelings including any feelings of guilt associated with her loss.  We will use cognitive behavioral therapy to help identify and change anxiety producing thoughts and behavior patterns so as to improve his ability to better manage anxiety and stress, and manage thoughts and worrisome thinking contributing to anxiety.  We will also refresh and encourage dialectical behavior therapy distress tolerance and mindfulness skills with the intention of reducing anxiety by 50% over the next 6 months. Progress: 30% Interventions: Cognitive Behavioral Therapy  Diagnosis:PTSD, Bi-Polar D/O  Plan: F/U/ appointments scheduled weekly.  [name], [facility]  [place] [facility] Counselor/Therapist Progress Note  Patient ID: Stephen Myers, MRN: [id number],    Date: 08/14/2023  Time Spent: 60 minutes spent in person with the patient. Treatment Type: Individual Therapy Reported Symptoms: anxiety, panic attacks The patient continues to struggle with short-term memory.  He is changing some of his providers in the area as he did not get results back after several weeks with minimal response.  He continues to be medication compliant.  He did spend some time with his son, daughter-in-law and grandson and says he is becoming more comfortable with his grandson.  He is also spending some time with his brothers over the next few days which he says is good for his anxiety.  His wife will be retiring next month and so they will be spending more time together.  We did talk more about his daughter and some of his feelings associated with her death.  He continues to grieve in a  healthy way.  Lately he has been looking at some of her art work and recognizing the talent that he already knew she had but appreciates even more so now.  There have also been a couple of former coworkers who have reached out to connect to him which she has appreciated.  I encouraged continued use of his coping skills for anxiety reduction.  He continues to walk daily, fish at a private pond where no one else is when he can especially on Sundays when his wife is with him.  We will continue to work on coping skills for helping with his anxiety and the progression of his Lewy body dementia diagnosis as well as processing his grief. 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  Speech/Language:  Normal Rate  Affect: Appropriate  Mood: normal  Thought process: normal  Thought content:   WNL  Sensory/Perceptual disturbances:   WNL  Orientation: oriented to person, place, and situation  Attention: Good  Concentration: Good  Memory: Impaired short term  Fund of knowledge:  Good  Insight:   Good  Judgment:  Good  Impulse Control: Good   Risk Assessment: Danger to Self:  No Self-injurious Behavior: No Danger to Others: No Duty to Warn:no Physical Aggression / Violence:No  Access to Firearms a concern: No  Gang Involvement:No  Treatment plan: Will employ cognitive behavioral therapy as well as grief and loss therapy for relief of anxiety and grief.  Goals of grief therapy or to have a healthier response to the loss of his daughter and less sadness as evidenced by patient report in therapy notes as well as to help him feel less overwhelmed by her loss.  Goals are to see a 50% reduction in grief symptoms over the next 6 months.  I will encourage the use of the patient telling his grief story about his daughter including encouraging him to bring pictures and memorabilia to help process his feelings including  any feelings of guilt associated with her loss.  We will use cognitive behavioral therapy to help identify and change anxiety producing thoughts and behavior patterns so as to improve his ability to better manage anxiety and stress, and manage thoughts and worrisome thinking contributing to anxiety.  We will also refresh and encourage dialectical behavior therapy distress tolerance and mindfulness skills with the intention of reducing anxiety by 50% over the next 6 months. Progress: 30% Interventions: Cognitive Behavioral Therapy  Diagnosis:PTSD, Bi-Polar  D/O  Plan: F/U/ appointments scheduled weekly.  [name], [facility]                                                                                                                  [name], [facility]  [place] [facility] Counselor/Therapist Progress Note  Patient ID: Stephen Myers, MRN: [id number],    Date: 08/14/2023  Time Spent: 60 minutes spent via video session. The pt. Was at ome and this therapist was in his home office. Treatment Type: Individual Therapy Reported Symptoms: anxiety, panic attacks The patient reports that he has been struggling with some of the same thoughts continually over the past few weeks.  Some of them are thoughts that he has dealt with in the past and has not necessarily gotten resolution or answers for but he says they continue to be something he thinks about and they have affected his quality of life and his sleep recently.  We looked at the root cause behind some of these thoughts and why they are being continued and the anxiety that they are creating.  We looked at ways to try to get these thoughts answered in some way and the best way to present those questions but we also looked at what the patient does with those answers if they are not what he expects or if he does not get a clear  answer at all.  We talked about the importance of coping with some of the feelings associated with these thoughts but also how to cognitively reframe or challenge these thoughts if necessary. We will continue to work on coping skills for helping with his anxiety and the progression of his Lewy body dementia diagnosis as well as processing his grief. 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  Speech/Language:  Normal Rate  Affect: Appropriate  Mood: normal  Thought process: normal  Thought content:   WNL  Sensory/Perceptual disturbances:   WNL  Orientation: oriented to person, place, and situation  Attention: Good  Concentration: Good  Memory: Impaired short term  Fund of knowledge:  Good  Insight:   Good  Judgment:  Good  Impulse Control: Good   Risk Assessment: Danger to Self:  No Self-injurious Behavior: No Danger to Others: No Duty to Warn:no Physical Aggression / Violence:No  Access to Firearms a concern: No  Gang Involvement:No  Treatment plan: Will employ cognitive behavioral therapy as well as grief and loss therapy for relief of anxiety and grief.  Goals of grief therapy or to have a healthier response to the loss of his daughter and less sadness as evidenced by patient report in therapy notes as well as to help him feel less overwhelmed by her loss.  Goals are to see a 50% reduction in grief symptoms over the next 6 months.  I will encourage the use of the patient telling his grief story about his daughter including encouraging him  to bring pictures and memorabilia to help process his feelings including any feelings of guilt associated with her loss.  We will use cognitive behavioral therapy to help identify and change anxiety producing thoughts and behavior patterns so as to improve his ability to better manage anxiety and stress, and manage thoughts and worrisome thinking contributing  to anxiety.  We will also refresh and encourage dialectical behavior therapy distress tolerance and mindfulness skills with the intention of reducing anxiety by 50% over the next 6 months. Progress: 30% Interventions: Cognitive Behavioral Therapy  Diagnosis:PTSD, Bi-Polar D/O  Plan: F/U/ appointments scheduled weekly.  [name], [facility]                                                                                                     [place] [facility] Counselor/Therapist Progress Note  Patient ID: Stephen Myers, MRN: [id number],    Date: 08/14/2023  Time Spent: 60 minutes spent in person with the patient. Treatment Type: Individual Therapy Reported Symptoms: anxiety, panic attacks The patient continues to struggle with short-term memory.  He is changing some of his providers in the area as he did not get results back after several weeks with minimal response.  He continues to be medication compliant.  He did spend some time with his son, daughter-in-law and grandson and says he is becoming more comfortable with his grandson.  He is also spending some time with his brothers over the next few days which he says is good for his anxiety.  His wife will be retiring next month and so they will be spending more time together.  We did talk more about his daughter and some of his feelings associated with her death.  He continues to grieve in a healthy way.  Lately he has been looking at some of her art work and recognizing the talent that he already knew she had but appreciates even more so now.  There have also been a couple of former coworkers who have reached out to connect to him which she has appreciated.  I encouraged continued use of his coping skills for anxiety reduction.  He continues to walk daily, fish at a private pond where no one else is when he can especially on Sundays when  his wife is with him.  We will continue to work on coping skills for helping with his anxiety and the progression of his Lewy body dementia diagnosis as well as processing his grief. 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  Speech/Language:  Normal Rate  Affect: Appropriate  Mood: normal  Thought process: normal  Thought content:   WNL  Sensory/Perceptual disturbances:   WNL  Orientation: oriented to person, place, and situation  Attention: Good  Concentration: Good  Memory: Impaired short term  Fund of knowledge:  Good  Insight:   Good  Judgment:  Good  Impulse Control: Good   Risk Assessment: Danger to Self:  No Self-injurious Behavior: No Danger to Others: No Duty  to Warn:no Physical Aggression / Violence:No  Access to Firearms a concern: No  Gang Involvement:No  Treatment plan: Will employ cognitive behavioral therapy as well as grief and loss therapy for relief of anxiety and grief.  Goals of grief therapy or to have a healthier response to the loss of his daughter and less sadness as evidenced by patient report in therapy notes as well as to help him feel less overwhelmed by her loss.  Goals are to see a 50% reduction in grief symptoms over the next 6 months.  I will encourage the use of the patient telling his grief story about his daughter including encouraging him to bring pictures and memorabilia to help process his feelings including any feelings of guilt associated with her loss.  We will use cognitive behavioral therapy to help identify and change anxiety producing thoughts and behavior patterns so as to improve his ability to better manage anxiety and stress, and manage thoughts and worrisome thinking contributing to anxiety.  We will also refresh and encourage dialectical behavior therapy distress tolerance and mindfulness skills with the intention of reducing anxiety by 50%  over the next 6 months. Progress: 30% Interventions: Cognitive Behavioral Therapy  Diagnosis:PTSD, Bi-Polar D/O  Plan: F/U/ appointments scheduled weekly.  [name], [facility]                                                                                     [place] [facility] Counselor/Therapist Progress Note  Patient ID: Stephen Myers, MRN: [id number],    Date: 08/14/2023 This session was held via video teletherapy. The patient consented to the video teletherapy and was located in his home during this session. He is aware it is the responsibility of the patient to secure confidentiality on his end of the session. The provider was in a private home office for the duration of this session.    The patient arrived on time for her Caregility session  Time Spent: 57 minutes, 2:01 PM to 2:58 PM  Treatment Type: Individual Therapy Reported Symptoms: anxiety, panic attacks  The patient reports feeling fairly blah over the past week.  He is going to physical therapy and that is helping somewhat balance as well as pain reduction in his knees.  His ankle is getting better but he can tell when he walks too much and starts to swell and is uncomfortable.  His back is feeling better.  There are still a few nights a week where he is having difficulty slowing his brain down and sleeping because he is angry.  We looked at what and who he was angry at and what I am processed those feelings associated with ways to help alleviate that cognitively and emotionally.  He and his wife are looking at possibly going to [facility] over Christmas because that is an area of their daughter left a lot.  He said they would probably sprinkle some ashes there.  Holidays are difficult and he is spending Thanksgiving day with his son and daughter-in-law and grandson but they may go to a beach close by for a few days over  Thanksgiving.  He still feels as if he  is grieving his daughter fairly well.  He reports that he feels that he is grieving a little bit a change in his diagnosis.  He recognizes that his memory is getting worse especially in terms of recall.  He thinks that clearly but has a difficult time getting from brain to mouth but eventually does.  Of frustration with how slow that is.  He is more frustrated with the fact that he gets confused more often especially with where he is going.  He recognizes that being fairly new to the area over the last couple years he does not recognize everything with says he is starting to even when he is in a place he knows have to stop and think more about where he is going.  He also feels at times that he is very alone and this medical fight.  He knows that he has family and friends in support and we looked at how others not understanding fully his struggle must feel very lonely to him.  In spite of that he remains positive and is using coping skills and laughter.  He does contract for safety having no thoughts of hurting himself or anyone else. 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  Speech/Language:  Normal Rate  Affect: Appropriate  Mood: normal  Thought process: normal  Thought content:   WNL  Sensory/Perceptual disturbances:   WNL  Orientation: oriented to person, place, and situation  Attention: Good  Concentration: Good  Memory: Impaired short term  Fund of knowledge:  Good  Insight:   Good  Judgment:  Good  Impulse Control: Good   Risk Assessment: Danger to Self:  No Self-injurious Behavior: No Danger to Others: No Duty to Warn:no Physical Aggression / Violence:No  Access to Firearms a concern: No  Gang Involvement:No  Treatment plan: Will employ cognitive behavioral therapy as well as grief and loss therapy for relief of anxiety and grief.  Goals of  grief therapy or to have a healthier response to the loss of his daughter and less sadness as evidenced by patient report in therapy notes as well as to help him feel less overwhelmed by her loss.  Goals are to see a 50% reduction in grief symptoms over the next 6 months.  I will encourage the use of the patient telling his grief story about his daughter including encouraging him to bring pictures and memorabilia to help process his feelings including any feelings of guilt associated with her loss.  We will use cognitive behavioral therapy to help identify and change anxiety producing thoughts and behavior patterns so as to improve his ability to better manage anxiety and stress, and manage thoughts and worrisome thinking contributing to anxiety.  We will also refresh and encourage dialectical behavior therapy distress tolerance and mindfulness skills with the intention of reducing anxiety by 50% over the next 6 months. Progress: 30% Of the original target date was July 05, 2023.  I have reviewed the treatment goals and the patient would like to continue with the initial goals with the new target date of January 02, 2024. Diagnosis:PTSD, Bi-Polar D/O  Plan: F/U/ appointments scheduled weekly.  [name], [facility]  [name], [facility]               [name], [facility]               [name], [facility]               [name], [facility]               [name], [facility]               [name], [facility]               [name], [facility]               [name], [facility]               [name], [facility]               [name],  [facility]               [name], [facility]               [name], [facility]

## 2023-08-21 ENCOUNTER — Encounter: Payer: Self-pay | Admitting: Behavioral Health

## 2023-08-21 ENCOUNTER — Ambulatory Visit (INDEPENDENT_AMBULATORY_CARE_PROVIDER_SITE_OTHER): Payer: Medicare Other | Admitting: Behavioral Health

## 2023-08-21 DIAGNOSIS — F4310 Post-traumatic stress disorder, unspecified: Secondary | ICD-10-CM

## 2023-08-21 DIAGNOSIS — F3181 Bipolar II disorder: Secondary | ICD-10-CM

## 2023-08-21 DIAGNOSIS — F319 Bipolar disorder, unspecified: Secondary | ICD-10-CM

## 2023-08-21 NOTE — Progress Notes (Signed)
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 he says they continue to be something he thinks about and they have affected his quality of life and his sleep recently.  We looked at the root cause behind some of these thoughts and why they are being continued and the anxiety that they are creating.  We looked at ways to try to get these thoughts answered in some way and the best way to present those questions but we also looked at what the patient does with those answers if they are not what he expects or if he does not get a clear answer at  all.  We talked about the importance of coping with some of the feelings associated with these thoughts but also how to cognitively reframe or challenge these thoughts if necessary. We will continue to work on coping skills for helping with his anxiety and the progression of his Lewy body dementia diagnosis as well as processing his grief. 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  Speech/Language:  Normal Rate  Affect: Appropriate  Mood: normal  Thought process: normal  Thought content:   WNL  Sensory/Perceptual disturbances:   WNL  Orientation: oriented to person, place, and situation  Attention: Good  Concentration: Good  Memory: Impaired short term  Fund of knowledge:  Good  Insight:   Good  Judgment:  Good  Impulse Control: Good   Risk Assessment: Danger to Self:  No Self-injurious Behavior: No Danger to Others: No Duty to Warn:no Physical Aggression / Violence:No  Access to Firearms a concern: No  Gang Involvement:No  Treatment plan: Will employ cognitive behavioral therapy as well as grief and loss therapy for relief of anxiety and grief.  Goals of grief therapy or to have a healthier response to the loss of his daughter and less sadness as evidenced by patient report in  therapy notes as well as to help him feel less overwhelmed by her loss.  Goals are to see a 50% reduction in grief symptoms over the next 6 months.  I will encourage the use of the patient telling his grief story about his daughter including encouraging him to bring pictures and memorabilia to help process his feelings including any feelings of guilt associated with her loss.  We will use cognitive behavioral therapy to help identify and change anxiety producing thoughts and behavior patterns so as to improve his ability to better manage anxiety and stress, and manage thoughts and worrisome thinking contributing to anxiety.  We will also refresh and encourage dialectical behavior therapy distress tolerance and mindfulness skills with the intention of reducing anxiety by 50% over the next 6 months. Progress: 30% Interventions: Cognitive Behavioral Therapy  Diagnosis:PTSD, Bi-Polar D/O  Plan: F/U/ appointments scheduled weekly.  [name], [facility]                                                                                                     [place] [facility] Counselor/Therapist Progress Note  Patient ID: Stephen Myers, MRN: [id number],    Date: 08/21/2023  Time Spent: 60 minutes spent in person with the patient. Treatment Type: Individual Therapy Reported Symptoms: anxiety, panic attacks The patient continues to struggle with short-term memory.  He is changing some of his providers in the area as he did not get results back after several weeks with minimal response.  He continues to be medication compliant.  He did spend some time with his son, daughter-in-law and grandson and says he is becoming more comfortable with his grandson.  He is also spending some time with his brothers over the next few days which  he says is good for his anxiety.  His wife will be retiring next month and  so they will be spending more time together.  We did talk more about his daughter and some of his feelings associated with her death.  He continues to grieve in a healthy way.  Lately he has been looking at some of her art work and recognizing the talent that he already knew she had but appreciates even more so now.  There have also been a couple of former coworkers who have reached out to connect to him which she has appreciated.  I encouraged continued use of his coping skills for anxiety reduction.  He continues to walk daily, fish at a private pond where no one else is when he can especially on Sundays when his wife is with him.  We will continue to work on coping skills for helping with his anxiety and the progression of his Lewy body dementia diagnosis as well as processing his grief. 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  Speech/Language:  Normal Rate  Affect: Appropriate  Mood: normal  Thought process: normal  Thought content:   WNL  Sensory/Perceptual disturbances:   WNL  Orientation: oriented to person, place, and situation  Attention: Good  Concentration: Good  Memory: Impaired short term  Fund of knowledge:  Good  Insight:   Good  Judgment:  Good  Impulse Control: Good   Risk Assessment: Danger to Self:  No Self-injurious Behavior: No Danger to Others: No Duty to Warn:no Physical Aggression / Violence:No  Access to Firearms a concern: No  Gang Involvement:No  Treatment plan: Will employ cognitive behavioral therapy as well as grief and loss therapy for relief of anxiety and grief.  Goals of grief therapy or to have a healthier response to the loss of his daughter and less sadness as evidenced by patient report in therapy notes as well as to help him feel less overwhelmed by her loss.  Goals are to see a 50% reduction in grief symptoms over the next 6 months.  I will encourage  the use of the patient telling his grief story about his daughter including encouraging him to bring pictures and memorabilia to help process his feelings including any feelings of guilt associated with her loss.  We will use cognitive behavioral therapy to help identify and change anxiety producing thoughts and behavior patterns so as to improve his ability to better manage anxiety and stress, and manage thoughts and worrisome thinking contributing to anxiety.  We will also refresh and encourage dialectical behavior therapy distress tolerance and mindfulness skills with the intention of reducing anxiety by 50% over the next 6 months. Progress: 30% Interventions: Cognitive Behavioral Therapy  Diagnosis:PTSD, Bi-Polar D/O  Plan: F/U/ appointments scheduled weekly.  [name], [facility]                                                                                                     [place] [facility] Counselor/Therapist  Progress Note  Patient ID: Stephen Myers, MRN: [id number],    Date: 08/21/2023  Time Spent: 60 minutes spent in person with the patient. Treatment Type: Individual Therapy Reported Symptoms: anxiety, panic attacks The patient continues to struggle with short-term memory.  He is changing some of his providers in the area as he did not get results back after several weeks with minimal response.  He continues to be medication compliant.  He did spend some time with his son, daughter-in-law and grandson and says he is becoming more comfortable with his grandson.  He is also spending some time with his brothers over the next few days which he says is good for his anxiety.  His wife will be retiring next month and so they will be spending more time together.  We did talk more about his daughter and some of his feelings associated with her death.  He continues to grieve in a  healthy way.  Lately he has been looking at some of her art work and recognizing the talent that he already knew she had but appreciates even more so now.  There have also been a couple of former coworkers who have reached out to connect to him which she has appreciated.  I encouraged continued use of his coping skills for anxiety reduction.  He continues to walk daily, fish at a private pond where no one else is when he can especially on Sundays when his wife is with him.  We will continue to work on coping skills for helping with his anxiety and the progression of his Lewy body dementia diagnosis as well as processing his grief. 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  Speech/Language:  Normal Rate  Affect: Appropriate  Mood: normal  Thought process: normal  Thought content:   WNL  Sensory/Perceptual disturbances:   WNL  Orientation: oriented to person, place, and situation  Attention: Good  Concentration: Good  Memory: Impaired short term  Fund of knowledge:  Good  Insight:   Good  Judgment:  Good  Impulse Control: Good   Risk Assessment: Danger to Self:  No Self-injurious Behavior: No Danger to Others: No Duty to Warn:no Physical Aggression / Violence:No  Access to Firearms a concern: No  Gang Involvement:No  Treatment plan: Will employ cognitive behavioral therapy as well as grief and loss therapy for relief of anxiety and grief.  Goals of grief therapy or to have a healthier response to the loss of his daughter and less sadness as evidenced by patient report in therapy notes as well as to help him feel less overwhelmed by her loss.  Goals are to see a 50% reduction in grief symptoms over the next 6 months.  I will encourage the use of the patient telling his grief story about his daughter including encouraging him to bring pictures and memorabilia to help process his feelings including  any feelings of guilt associated with her loss.  We will use cognitive behavioral therapy to help identify and change anxiety producing thoughts and behavior patterns so as to improve his ability to better manage anxiety and stress, and manage thoughts and worrisome thinking contributing to anxiety.  We will also refresh and encourage dialectical behavior therapy distress tolerance and mindfulness skills with the intention of reducing anxiety by 50% over the next 6 months. Progress: 30% Interventions: Cognitive Behavioral Therapy  Diagnosis:PTSD, Bi-Polar D/O  Plan: F/U/  appointments scheduled weekly.  [name], [facility]                                                                                                                  [name], [facility]  [place] [facility] Counselor/Therapist Progress Note  Patient ID: Stephen Myers, MRN: [id number],    Date: 08/21/2023  Time Spent: 60 minutes spent via video session. The pt. Was [place] [facility] and this therapist was in his home office. Treatment Type: Individual Therapy Reported Symptoms: anxiety, panic attacks The patient reports that he has been struggling with some of the same thoughts continually over the past few weeks.  Some of them are thoughts that he has dealt with in the past and has not necessarily gotten resolution or answers for but he says they continue to be something he thinks about and they have affected his quality of life and his sleep recently.  We looked at the root cause behind some of these thoughts and why they are being continued and the anxiety that they are creating.  We looked at ways to try to get these thoughts answered in some way and the best way to present those questions but we also looked at what the patient does with those answers if they are not what he expects or if he does not get a clear  answer at all.  We talked about the importance of coping with some of the feelings associated with these thoughts but also how to cognitively reframe or challenge these thoughts if necessary. We will continue to work on coping skills for helping with his anxiety and the progression of his Lewy body dementia diagnosis as well as processing his grief. 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  Speech/Language:  Normal Rate  Affect: Appropriate  Mood: normal  Thought process: normal  Thought content:   WNL  Sensory/Perceptual disturbances:   WNL  Orientation: oriented to person, place, and situation  Attention: Good  Concentration: Good  Memory: Impaired short term  Fund of knowledge:  Good  Insight:   Good  Judgment:  Good  Impulse Control: Good   Risk Assessment: Danger to Self:  No Self-injurious Behavior: No Danger to Others: No Duty to Warn:no Physical Aggression / Violence:No  Access to Firearms a concern: No  Gang Involvement:No  Treatment plan: Will employ cognitive behavioral therapy as well as grief and loss therapy for relief of anxiety and grief.  Goals of grief therapy or to have a healthier response to the loss of his daughter and less sadness as evidenced by patient report in therapy notes as well as to help him feel less overwhelmed by her loss.  Goals are to see a 50% reduction in grief symptoms over the next 6 months.  I will encourage the use of the patient telling his grief story about his daughter including encouraging him to bring pictures and  memorabilia to help process his feelings including any feelings of guilt associated with her loss.  We will use cognitive behavioral therapy to help identify and change anxiety producing thoughts and behavior patterns so as to improve his ability to better manage anxiety and stress, and manage thoughts and worrisome thinking contributing  to anxiety.  We will also refresh and encourage dialectical behavior therapy distress tolerance and mindfulness skills with the intention of reducing anxiety by 50% over the next 6 months. Progress: 30% Interventions: Cognitive Behavioral Therapy  Diagnosis:PTSD, Bi-Polar D/O  Plan: F/U/ appointments scheduled weekly.  [name], [facility]                                                                                                     [place] [facility] Counselor/Therapist Progress Note  Patient ID: Stephen Myers, MRN: [id number],    Date: 08/21/2023  Time Spent: 60 minutes spent in person with the patient. Treatment Type: Individual Therapy Reported Symptoms: anxiety, panic attacks The patient continues to struggle with short-term memory.  He is changing some of his providers in the area as he did not get results back after several weeks with minimal response.  He continues to be medication compliant.  He did spend some time with his son, daughter-in-law and grandson and says he is becoming more comfortable with his grandson.  He is also spending some time with his brothers over the next few days which he says is good for his anxiety.  His wife will be retiring next month and so they will be spending more time together.  We did talk more about his daughter and some of his feelings associated with her death.  He continues to grieve in a healthy way.  Lately he has been looking at some of her art work and recognizing the talent that he already knew she had but appreciates even more so now.  There have also been a couple of former coworkers who have reached out to connect to him which she has appreciated.  I encouraged continued use of his coping skills for anxiety reduction.  He continues to walk daily, fish at a private pond where no one else is when he can especially on Sundays when  his wife is with him.  We will continue to work on coping skills for helping with his anxiety and the progression of his Lewy body dementia diagnosis as well as processing his grief. 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  Speech/Language:  Normal Rate  Affect: Appropriate  Mood: normal  Thought process: normal  Thought content:   WNL  Sensory/Perceptual disturbances:   WNL  Orientation: oriented to person, place, and situation  Attention: Good  Concentration: Good  Memory: Impaired short term  Fund of knowledge:  Good  Insight:   Good  Judgment:  Good  Impulse Control: Good   Risk Assessment: Danger to Self:  No Self-injurious Behavior: No Danger to Others: No Duty to Warn:no Physical Aggression /  Violence:No  Access to Firearms a concern: No  Gang Involvement:No  Treatment plan: Will employ cognitive behavioral therapy as well as grief and loss therapy for relief of anxiety and grief.  Goals of grief therapy or to have a healthier response to the loss of his daughter and less sadness as evidenced by patient report in therapy notes as well as to help him feel less overwhelmed by her loss.  Goals are to see a 50% reduction in grief symptoms over the next 6 months.  I will encourage the use of the patient telling his grief story about his daughter including encouraging him to bring pictures and memorabilia to help process his feelings including any feelings of guilt associated with her loss.  We will use cognitive behavioral therapy to help identify and change anxiety producing thoughts and behavior patterns so as to improve his ability to better manage anxiety and stress, and manage thoughts and worrisome thinking contributing to anxiety.  We will also refresh and encourage dialectical behavior therapy distress tolerance and mindfulness skills with the intention of reducing anxiety by 50%  over the next 6 months. Progress: 30% Interventions: Cognitive Behavioral Therapy  Diagnosis:PTSD, Bi-Polar D/O  Plan: F/U/ appointments scheduled weekly.  [name], [facility]                                                                                      Behavioral Health Counselor/Therapist Progress Note  Patient ID: Stephen Myers, MRN: [id number],    Date: 08/21/2023 This session was held via video teletherapy. The patient consented to the video teletherapy and was located in his home during this session. He is aware it is the responsibility of the patient to secure confidentiality on his end of the session. The provider was in a private home office for the duration of this session.    The patient arrived on time for her Caregility session  Time Spent: 57 minutes, 2:01 PM to 2:58 PM  Treatment Type: Individual Therapy Reported Symptoms: anxiety, panic attacks  The patient reports feeling fairly blah over the past week.  He is going to physical therapy and that is helping somewhat balance as well as pain reduction in his knees.  His ankle is getting better but he can tell when he walks too much and starts to swell and is uncomfortable.  His back is feeling better.  There are still a few nights a week where he is having difficulty slowing his brain down and sleeping because he is angry.  We looked at what and who he was angry at and what I am processed those feelings associated with ways to help alleviate that cognitively and emotionally.  He and his wife are looking at possibly going to [facility] over Christmas because that is an area of their daughter left a lot.  He said they would probably sprinkle some ashes there.  Holidays are difficult and he is spending Thanksgiving day with his son and daughter-in-law and grandson but they may Myers to a beach close by for a few days over  Thanksgiving.  He still feels as if he is grieving his daughter fairly  well.  He reports that he feels that he is grieving a little bit a change in his diagnosis.  He recognizes that his memory is getting worse especially in terms of recall.  He thinks that clearly but has a difficult time getting from brain to mouth but eventually does.  Of frustration with how slow that is.  He is more frustrated with the fact that he gets confused more often especially with where he is going.  He recognizes that being fairly new to the area over the last couple years he does not recognize everything with says he is starting to even when he is in a place he knows have to stop and think more about where he is going.  He also feels at times that he is very alone and this medical fight.  He knows that he has family and friends in support and we looked at how others not understanding fully his struggle must feel very lonely to him.  In spite of that he remains positive and is using coping skills and laughter.  He does contract for safety having no thoughts of hurting himself or anyone else. 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  Speech/Language:  Normal Rate  Affect: Appropriate  Mood: normal  Thought process: normal  Thought content:   WNL  Sensory/Perceptual disturbances:   WNL  Orientation: oriented to person, place, and situation  Attention: Good  Concentration: Good  Memory: Impaired short term  Fund of knowledge:  Good  Insight:   Good  Judgment:  Good  Impulse Control: Good   Risk Assessment: Danger to Self:  No Self-injurious Behavior: No Danger to Others: No Duty to Warn:no Physical Aggression / Violence:No  Access to Firearms a concern: No  Gang Involvement:No  Treatment plan: Will employ cognitive behavioral therapy as well as grief and loss therapy for relief of anxiety and grief.  Goals of  grief therapy or to have a healthier response to the loss of his daughter and less sadness as evidenced by patient report in therapy notes as well as to help him feel less overwhelmed by her loss.  Goals are to see a 50% reduction in grief symptoms over the next 6 months.  I will encourage the use of the patient telling his grief story about his daughter including encouraging him to bring pictures and memorabilia to help process his feelings including any feelings of guilt associated with her loss.  We will use cognitive behavioral therapy to help identify and change anxiety producing thoughts and behavior patterns so as to improve his ability to better manage anxiety and stress, and manage thoughts and worrisome thinking contributing to anxiety.  We will also refresh and encourage dialectical behavior therapy distress tolerance and mindfulness skills with the intention of reducing anxiety by 50% over the next 6 months. Progress: 30% Of the original target date was July 05, 2023.  I have reviewed the treatment goals and the patient would like to continue with the initial goals with the new target date of January 02, 2024. Diagnosis:PTSD, Bi-Polar D/O  Plan: F/U/ appointments scheduled weekly.  [name], [facility]  [name], [facility]               [name], [facility]               [name], [facility]               [name], [facility]               [name], [facility]               [name], [facility]               [name], [facility]               [name], [facility]               [name], [facility]               [name],  [facility]               [name], [facility]               [name], [facility]               [name], [facility]

## 2023-08-28 ENCOUNTER — Ambulatory Visit: Payer: Medicare Other | Admitting: Behavioral Health

## 2023-08-28 ENCOUNTER — Encounter: Payer: Self-pay | Admitting: Behavioral Health

## 2023-08-28 DIAGNOSIS — F319 Bipolar disorder, unspecified: Secondary | ICD-10-CM

## 2023-08-28 DIAGNOSIS — F3181 Bipolar II disorder: Secondary | ICD-10-CM

## 2023-08-28 DIAGNOSIS — F4310 Post-traumatic stress disorder, unspecified: Secondary | ICD-10-CM | POA: Diagnosis not present

## 2023-08-28 NOTE — Progress Notes (Signed)
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 does with those answers if they are not what he expects or if he does not get a clear answer  at all.  We talked about the importance of coping with some of the feelings associated with these thoughts but also how to cognitively reframe or challenge these thoughts if necessary. We will continue to work on coping skills for helping with his anxiety and the progression of his Lewy body dementia diagnosis as well as processing his grief. 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  Speech/Language:  Normal Rate  Affect: Appropriate  Mood: normal  Thought process: normal  Thought content:   WNL  Sensory/Perceptual disturbances:   WNL  Orientation: oriented to person, place, and situation  Attention: Good  Concentration: Good  Memory: Impaired short term  Fund of knowledge:  Good  Insight:   Good  Judgment:  Good  Impulse Control: Good   Risk Assessment: Danger to Self:  No Self-injurious Behavior: No Danger to Others: No Duty to Warn:no Physical Aggression / Violence:No  Access to Firearms a concern: No  Gang Involvement:No  Treatment plan: Will employ cognitive behavioral therapy as well as grief and loss therapy for relief of anxiety and grief.  Goals of grief therapy or to have a healthier response to the loss of his daughter and less sadness as evidenced by patient report in therapy notes as well as to help him feel less overwhelmed by her loss.  Goals are to see a 50% reduction in grief symptoms over the next 6 months.  I will encourage the use of the patient telling his grief story about his daughter including encouraging him to bring pictures and memorabilia to help process his feelings including any feelings of guilt associated with her loss.  We will use cognitive behavioral therapy to help identify and  change anxiety producing thoughts and behavior patterns so as to improve his ability to better manage anxiety and stress, and manage thoughts and worrisome thinking contributing to anxiety.  We will also refresh and encourage dialectical behavior therapy distress tolerance and mindfulness skills with the intention of reducing anxiety by 50% over the next 6 months. Progress: 30% Interventions: Cognitive Behavioral Therapy  Diagnosis:PTSD, Bi-Polar D/O  Plan: F/U/ appointments scheduled weekly.  [name], [facility]                                                                                                     [place] [facility] Counselor/Therapist Progress Note  Patient ID: Stephen Myers, MRN: [id number],    Date: 08/28/2023  Time Spent: 60 minutes spent in person with the patient. Treatment Type: Individual Therapy Reported Symptoms: anxiety, panic attacks The patient continues to struggle with short-term memory.  He is changing some of his providers in the area as he did not get results back after several weeks with minimal response.  He continues to be medication compliant.  He did spend some time with his son, daughter-in-law and grandson and says he is becoming more comfortable with his grandson.  He is also spending some time with his brothers over the next few days  which he says is good for his anxiety.  His wife will be retiring next month and so they will be spending more time together.  We did talk more about his daughter and some of his feelings associated with her death.  He continues to grieve in a healthy way.  Lately he has been looking at some of her art work and recognizing the talent that he already knew she had but appreciates even more so now.  There have also been a couple of former coworkers who have reached out to connect to him which she has appreciated.  I  encouraged continued use of his coping skills for anxiety reduction.  He continues to walk daily, fish at a private pond where no one else is when he can especially on Sundays when his wife is with him.  We will continue to work on coping skills for helping with his anxiety and the progression of his Lewy body dementia diagnosis as well as processing his grief. 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  Speech/Language:  Normal Rate  Affect: Appropriate  Mood: normal  Thought process: normal  Thought content:   WNL  Sensory/Perceptual disturbances:   WNL  Orientation: oriented to person, place, and situation  Attention: Good  Concentration: Good  Memory: Impaired short term  Fund of knowledge:  Good  Insight:   Good  Judgment:  Good  Impulse Control: Good   Risk Assessment: Danger to Self:  No Self-injurious Behavior: No Danger to Others: No Duty to Warn:no Physical Aggression / Violence:No  Access to Firearms a concern: No  Gang Involvement:No  Treatment plan: Will employ cognitive behavioral therapy as well as grief and loss therapy for relief of anxiety and grief.  Goals of grief therapy or to have a healthier response to the loss of his daughter and less sadness as evidenced by patient report in therapy notes as well as to help him feel less overwhelmed by her loss.  Goals are to see a 50% reduction in grief symptoms over the next 6 months.  I will encourage the use of the patient telling his grief story about his daughter including encouraging him to bring pictures and memorabilia to help process his feelings including any feelings of guilt associated with her loss.  We will use cognitive behavioral therapy to help identify and change anxiety producing thoughts and behavior patterns so as to improve his ability to better manage anxiety and stress, and manage thoughts and worrisome thinking  contributing to anxiety.  We will also refresh and encourage dialectical behavior therapy distress tolerance and mindfulness skills with the intention of reducing anxiety by 50% over the next 6 months. Progress: 30% Interventions: Cognitive Behavioral Therapy  Diagnosis:PTSD, Bi-Polar D/O  Plan: F/U/ appointments scheduled weekly.  [name], [facility]                                                                                                      Behavioral Health  Counselor/Therapist Progress Note  Patient ID: Stephen Myers, MRN: [id number],    Date: 08/28/2023  Time Spent: 60 minutes spent in person with the patient. Treatment Type: Individual Therapy Reported Symptoms: anxiety, panic attacks The patient continues to struggle with short-term memory.  He is changing some of his providers in the area as he did not get results back after several weeks with minimal response.  He continues to be medication compliant.  He did spend some time with his son, daughter-in-law and grandson and says he is becoming more comfortable with his grandson.  He is also spending some time with his brothers over the next few days which he says is good for his anxiety.  His wife will be retiring next month and so they will be spending more time together.  We did talk more about his daughter and some of his feelings associated with her death.  He continues to grieve in a healthy way.  Lately he has been looking at some of her art work and recognizing the talent that he already knew she had but appreciates even more so now.  There have also been a couple of former coworkers who have reached out to connect to him which she has appreciated.  I encouraged continued use of his coping skills for anxiety reduction.  He continues to walk daily, fish at a private pond where no one else is when he can especially on  Sundays when his wife is with him.  We will continue to work on coping skills for helping with his anxiety and the progression of his Lewy body dementia diagnosis as well as processing his grief. 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  Speech/Language:  Normal Rate  Affect: Appropriate  Mood: normal  Thought process: normal  Thought content:   WNL  Sensory/Perceptual disturbances:   WNL  Orientation: oriented to person, place, and situation  Attention: Good  Concentration: Good  Memory: Impaired short term  Fund of knowledge:  Good  Insight:   Good  Judgment:  Good  Impulse Control: Good   Risk Assessment: Danger to Self:  No Self-injurious Behavior: No Danger to Others: No Duty to Warn:no Physical Aggression / Violence:No  Access to Firearms a concern: No  Gang Involvement:No  Treatment plan: Will employ cognitive behavioral therapy as well as grief and loss therapy for relief of anxiety and grief.  Goals of grief therapy or to have a healthier response to the loss of his daughter and less sadness as evidenced by patient report in therapy notes as well as to help him feel less overwhelmed by her loss.  Goals are to see a 50% reduction in grief symptoms over the next 6 months.  I will encourage the use of the patient telling his grief story about his daughter including encouraging him to bring pictures and memorabilia to help process his feelings including any feelings of guilt associated with her loss.  We will use cognitive behavioral therapy to help identify and change anxiety producing thoughts and behavior patterns so as to improve his ability to better manage anxiety and stress, and manage thoughts and worrisome thinking contributing to anxiety.  We will also refresh and encourage dialectical behavior therapy distress tolerance and mindfulness skills with the intention of reducing  anxiety by 50% over the next 6 months. Progress: 30% Interventions: Cognitive Behavioral Therapy  Diagnosis:PTSD, Bi-Polar D/O  Plan:  F/U/ appointments scheduled weekly.  [name], [facility]                                                                                                                  [name], [facility]  [place] [facility] Counselor/Therapist Progress Note  Patient ID: Stephen Myers, MRN: [id number],    Date: 08/28/2023  Time Spent: 60 minutes spent via video session. The pt. Was [place] [facility] and this therapist was in his home office. Treatment Type: Individual Therapy Reported Symptoms: anxiety, panic attacks The patient reports that he has been struggling with some of the same thoughts continually over the past few weeks.  Some of them are thoughts that he has dealt with in the past and has not necessarily gotten resolution or answers for but he says they continue to be something he thinks about and they have affected his quality of life and his sleep recently.  We looked at the root cause behind some of these thoughts and why they are being continued and the anxiety that they are creating.  We looked at ways to try to get these thoughts answered in some way and the best way to present those questions but we also looked at what the patient does with those answers if they are not what he expects or if he does not get a clear answer at all.  We talked about the importance of coping with some of the feelings associated with these thoughts but also how to cognitively reframe or challenge these thoughts if necessary. We will continue to work on coping skills for helping with his anxiety and the progression of his Lewy body dementia diagnosis as well as processing his grief. Mental Status Exam: Appearance:  Well Groomed     Behavior: Appropriate  Motor: Pt.  Reports some instability due to Lewey Body dementia  Speech/Language:  Normal Rate  Affect: Appropriate  Mood: normal  Thought process: normal  Thought content:   WNL  Sensory/Perceptual disturbances:   WNL  Orientation: oriented to person, place, and situation  Attention: Good  Concentration: Good  Memory: Impaired short term  Fund of knowledge:  Good  Insight:   Good  Judgment:  Good  Impulse Control: Good   Risk Assessment: Danger to Self:  No Self-injurious Behavior: No Danger to Others: No Duty to Warn:no Physical Aggression / Violence:No  Access to Firearms a concern: No  Gang Involvement:No  Treatment plan: Will employ cognitive behavioral therapy as well as grief and loss therapy for relief of anxiety and grief.  Goals of grief therapy or to have a healthier response to the loss of his daughter and less sadness as evidenced by patient report in therapy notes as well as to help him feel less overwhelmed by her loss.  Goals are to see a 50% reduction in grief symptoms over the next 6 months.  I will encourage the use of the patient telling his grief story about his daughter including encouraging him to bring pictures  and memorabilia to help process his feelings including any feelings of guilt associated with her loss.  We will use cognitive behavioral therapy to help identify and change anxiety producing thoughts and behavior patterns so as to improve his ability to better manage anxiety and stress, and manage thoughts and worrisome thinking contributing to anxiety.  We will also refresh and encourage dialectical behavior therapy distress tolerance and mindfulness skills with the intention of reducing anxiety by 50% over the next 6 months. Progress: 30% Interventions: Cognitive Behavioral Therapy  Diagnosis:PTSD, Bi-Polar D/O  Plan: F/U/ appointments scheduled weekly.  [name],  [facility]                                                                                                     [place] [facility] Counselor/Therapist Progress Note  Patient ID: Stephen Myers, MRN: [id number],    Date: 08/28/2023  Time Spent: 60 minutes spent in person with the patient. Treatment Type: Individual Therapy Reported Symptoms: anxiety, panic attacks The patient continues to struggle with short-term memory.  He is changing some of his providers in the area as he did not get results back after several weeks with minimal response.  He continues to be medication compliant.  He did spend some time with his son, daughter-in-law and grandson and says he is becoming more comfortable with his grandson.  He is also spending some time with his brothers over the next few days which he says is good for his anxiety.  His wife will be retiring next month and so they will be spending more time together.  We did talk more about his daughter and some of his feelings associated with her death.  He continues to grieve in a healthy way.  Lately he has been looking at some of her art work and recognizing the talent that he already knew she had but appreciates even more so now.  There have also been a couple of former coworkers who have reached out to connect to him which she has appreciated.  I encouraged continued use of his coping skills for anxiety reduction.  He continues to walk daily, fish at a private pond where no one else is when he can especially on Sundays when his wife is with him.  We will continue to work on coping skills for helping with his anxiety and the progression of his Lewy body dementia diagnosis as well as processing his grief. 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  Speech/Language:   Normal Rate  Affect: Appropriate  Mood: normal  Thought process: normal  Thought content:   WNL  Sensory/Perceptual disturbances:   WNL  Orientation: oriented to person, place, and situation  Attention: Good  Concentration: Good  Memory: Impaired short term  Fund of knowledge:  Good  Insight:   Good  Judgment:  Good  Impulse Control: Good   Risk Assessment: Danger to Self:  No Self-injurious Behavior: No Danger to Others: No Duty to Warn:no Physical  Aggression / Violence:No  Access to Firearms a concern: No  Gang Involvement:No  Treatment plan: Will employ cognitive behavioral therapy as well as grief and loss therapy for relief of anxiety and grief.  Goals of grief therapy or to have a healthier response to the loss of his daughter and less sadness as evidenced by patient report in therapy notes as well as to help him feel less overwhelmed by her loss.  Goals are to see a 50% reduction in grief symptoms over the next 6 months.  I will encourage the use of the patient telling his grief story about his daughter including encouraging him to bring pictures and memorabilia to help process his feelings including any feelings of guilt associated with her loss.  We will use cognitive behavioral therapy to help identify and change anxiety producing thoughts and behavior patterns so as to improve his ability to better manage anxiety and stress, and manage thoughts and worrisome thinking contributing to anxiety.  We will also refresh and encourage dialectical behavior therapy distress tolerance and mindfulness skills with the intention of reducing anxiety by 50% over the next 6 months. Progress: 30% Interventions: Cognitive Behavioral Therapy  Diagnosis:PTSD, Bi-Polar D/O  Plan: F/U/ appointments scheduled weekly.  [name],  [facility]                                                                                     [place] [facility] Counselor/Therapist Progress Note  Patient ID: Stephen Myers, MRN: [id number],    Date: 08/28/2023 This session was held via video teletherapy. The patient consented to the video teletherapy and was located in his home during this session. He is aware it is the responsibility of the patient to secure confidentiality on his end of the session. The provider was in a private home office for the duration of this session.    The patient arrived on time for her Caregility session  Time Spent: 57 minutes, 2:01 PM to 2:58 PM  Treatment Type: Individual Therapy Reported Symptoms: anxiety, panic attacks  The patient reports feeling fairly blah over the past week.  He is going to physical therapy and that is helping somewhat balance as well as pain reduction in his knees.  His ankle is getting better but he can tell when he walks too much and starts to swell and is uncomfortable.  His back is feeling better.  There are still a few nights a week where he is having difficulty slowing his brain down and sleeping because he is angry.  We looked at what and who he was angry at and what I am processed those feelings associated with ways to help alleviate that cognitively and emotionally.  He and his wife are looking at possibly going to [facility] over Christmas because that is an area of their daughter left a lot.  He said they would probably sprinkle some ashes there.  Holidays are difficult and he is spending Thanksgiving day with his son and daughter-in-law and grandson but they may go to a beach close by for a few days over Thanksgiving.  He still feels as if he is grieving his daughter  fairly well.  He reports that he feels that he is grieving a little bit a change in his diagnosis.  He recognizes that his  memory is getting worse especially in terms of recall.  He thinks that clearly but has a difficult time getting from brain to mouth but eventually does.  Of frustration with how slow that is.  He is more frustrated with the fact that he gets confused more often especially with where he is going.  He recognizes that being fairly new to the area over the last couple years he does not recognize everything with says he is starting to even when he is in a place he knows have to stop and think more about where he is going.  He also feels at times that he is very alone and this medical fight.  He knows that he has family and friends in support and we looked at how others not understanding fully his struggle must feel very lonely to him.  In spite of that he remains positive and is using coping skills and laughter.  He does contract for safety having no thoughts of hurting himself or anyone else. 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  Speech/Language:  Normal Rate  Affect: Appropriate  Mood: normal  Thought process: normal  Thought content:   WNL  Sensory/Perceptual disturbances:   WNL  Orientation: oriented to person, place, and situation  Attention: Good  Concentration: Good  Memory: Impaired short term  Fund of knowledge:  Good  Insight:   Good  Judgment:  Good  Impulse Control: Good   Risk Assessment: Danger to Self:  No Self-injurious Behavior: No Danger to Others: No Duty to Warn:no Physical Aggression / Violence:No  Access to Firearms a concern: No  Gang Involvement:No  Treatment plan: Will employ cognitive behavioral therapy as well as grief and loss therapy for relief of anxiety and grief.  Goals of grief therapy or to have a healthier response to the loss of his daughter and less sadness as evidenced by patient report in therapy notes as well as to help him feel less overwhelmed by her  loss.  Goals are to see a 50% reduction in grief symptoms over the next 6 months.  I will encourage the use of the patient telling his grief story about his daughter including encouraging him to bring pictures and memorabilia to help process his feelings including any feelings of guilt associated with her loss.  We will use cognitive behavioral therapy to help identify and change anxiety producing thoughts and behavior patterns so as to improve his ability to better manage anxiety and stress, and manage thoughts and worrisome thinking contributing to anxiety.  We will also refresh and encourage dialectical behavior therapy distress tolerance and mindfulness skills with the intention of reducing anxiety by 50% over the next 6 months. Progress: 30% Of the original target date was July 05, 2023.  I have reviewed the treatment goals and the patient would like to continue with the initial goals with the new target date of January 02, 2024. Diagnosis:PTSD, Bi-Polar D/O  Plan: F/U/ appointments scheduled weekly.  [name], [facility]  [name], [facility]               [name], [facility]               [name], [facility]               [name], [facility]               [name], [facility]               [name], [facility]               [name], [facility]               [name], [facility]               [name], [facility]               [name], [facility]               [name], [facility]               [name], [facility]               [name], [facility]               [name], [facility]

## 2023-09-04 ENCOUNTER — Encounter: Payer: Self-pay | Admitting: Behavioral Health

## 2023-09-04 ENCOUNTER — Ambulatory Visit: Payer: Medicare Other | Admitting: Behavioral Health

## 2023-09-04 DIAGNOSIS — F319 Bipolar disorder, unspecified: Secondary | ICD-10-CM

## 2023-09-04 DIAGNOSIS — F4310 Post-traumatic stress disorder, unspecified: Secondary | ICD-10-CM

## 2023-09-04 DIAGNOSIS — F3181 Bipolar II disorder: Secondary | ICD-10-CM

## 2023-09-04 NOTE — Progress Notes (Signed)
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 and why they are being continued and the anxiety that they are creating.  We looked at ways to try to get these thoughts answered in some  way and the best way to present those questions but we also looked at what the patient does with those answers if they are not what he expects or if he does not get a clear answer  at all.  We talked about the importance of coping with some of the feelings associated with these thoughts but also how to cognitively reframe or challenge these thoughts if necessary. We will continue to work on coping skills for helping with his anxiety and the progression of his Lewy body dementia diagnosis as well as processing his grief. 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  Speech/Language:  Normal Rate  Affect: Appropriate  Mood: normal  Thought process: normal  Thought content:   WNL  Sensory/Perceptual disturbances:   WNL  Orientation: oriented to person, place, and situation  Attention: Good  Concentration: Good  Memory: Impaired short term  Fund of knowledge:  Good  Insight:   Good  Judgment:  Good  Impulse Control: Good   Risk Assessment: Danger to Self:  No Self-injurious Behavior: No Danger to Others: No Duty to Warn:no Physical Aggression / Violence:No  Access to Firearms a concern: No  Gang Involvement:No  Treatment plan: Will employ cognitive behavioral therapy as well as grief and loss therapy for relief of anxiety and grief.  Goals of grief therapy or to have a healthier response to the loss of his daughter and less sadness as evidenced by patient report in therapy notes as well as to help him feel less overwhelmed by her loss.  Goals are to see a 50% reduction in grief symptoms over the next 6 months.  I will encourage the use of the patient telling his grief story about his daughter including encouraging him to bring pictures and memorabilia to help process his feelings including any feelings of guilt associated with her loss.  We will use cognitive behavioral therapy to help identify and  change anxiety producing thoughts and behavior patterns so as to improve his ability to better manage anxiety and stress, and manage thoughts and worrisome thinking contributing to anxiety.  We will also refresh and encourage dialectical behavior therapy distress tolerance and mindfulness skills with the intention of reducing anxiety by 50% over the next 6 months. Progress: 30% Interventions: Cognitive Behavioral Therapy  Diagnosis:PTSD, Bi-Polar D/O  Plan: F/U/ appointments scheduled weekly.  [name], [facility]                                                                                                     [facility] Counselor/Therapist Progress Note  Patient ID: Ric Rosenberg, MRN: [id number],    Date: 09/04/2023  Time Spent: 60 minutes spent in person with the patient. Treatment Type: Individual Therapy Reported Symptoms: anxiety, panic attacks The patient continues to struggle with short-term memory.  He is changing some of his providers in the area as he did not get results back after several weeks with minimal response.  He continues to be medication compliant.  He did spend some time with his son, daughter-in-law and grandson and says he is becoming more comfortable with his grandson.  He is also spending some time with his brothers over the next few days  which he says is good for his anxiety.  His wife will be retiring next month and so they will be spending more time together.  We did talk more about his daughter and some of his feelings associated with her death.  He continues to grieve in a healthy way.  Lately he has been looking at some of her art work and recognizing the talent that he already knew she had but appreciates even more so now.  There have also been a couple of former coworkers who have reached out to connect to him which she has appreciated.  I  encouraged continued use of his coping skills for anxiety reduction.  He continues to walk daily, fish at a private pond where no one else is when he can especially on Sundays when his wife is with him.  We will continue to work on coping skills for helping with his anxiety and the progression of his Lewy body dementia diagnosis as well as processing his grief. 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  Speech/Language:  Normal Rate  Affect: Appropriate  Mood: normal  Thought process: normal  Thought content:   WNL  Sensory/Perceptual disturbances:   WNL  Orientation: oriented to person, place, and situation  Attention: Good  Concentration: Good  Memory: Impaired short term  Fund of knowledge:  Good  Insight:   Good  Judgment:  Good  Impulse Control: Good   Risk Assessment: Danger to Self:  No Self-injurious Behavior: No Danger to Others: No Duty to Warn:no Physical Aggression / Violence:No  Access to Firearms a concern: No  Gang Involvement:No  Treatment plan: Will employ cognitive behavioral therapy as well as grief and loss therapy for relief of anxiety and grief.  Goals of grief therapy or to have a healthier response to the loss of his daughter and less sadness as evidenced by patient report in therapy notes as well as to help him feel less overwhelmed by her loss.  Goals are to see a 50% reduction in grief symptoms over the next 6 months.  I will encourage the use of the patient telling his grief story about his daughter including encouraging him to bring pictures and memorabilia to help process his feelings including any feelings of guilt associated with her loss.  We will use cognitive behavioral therapy to help identify and change anxiety producing thoughts and behavior patterns so as to improve his ability to better manage anxiety and stress, and manage thoughts and worrisome thinking  contributing to anxiety.  We will also refresh and encourage dialectical behavior therapy distress tolerance and mindfulness skills with the intention of reducing anxiety by 50% over the next 6 months. Progress: 30% Interventions: Cognitive Behavioral Therapy  Diagnosis:PTSD, Bi-Polar D/O  Plan: F/U/ appointments scheduled weekly.  [name], [facility]                                                                                                     [facility]  Counselor/Therapist Progress Note  Patient ID: Welcome Fults, MRN: [id number],    Date: 09/04/2023  Time Spent: 60 minutes spent in person with the patient. Treatment Type: Individual Therapy Reported Symptoms: anxiety, panic attacks The patient continues to struggle with short-term memory.  He is changing some of his providers in the area as he did not get results back after several weeks with minimal response.  He continues to be medication compliant.  He did spend some time with his son, daughter-in-law and grandson and says he is becoming more comfortable with his grandson.  He is also spending some time with his brothers over the next few days which he says is good for his anxiety.  His wife will be retiring next month and so they will be spending more time together.  We did talk more about his daughter and some of his feelings associated with her death.  He continues to grieve in a healthy way.  Lately he has been looking at some of her art work and recognizing the talent that he already knew she had but appreciates even more so now.  There have also been a couple of former coworkers who have reached out to connect to him which she has appreciated.  I encouraged continued use of his coping skills for anxiety reduction.  He continues to walk daily, fish at a private pond where no one else is when he can especially on  Sundays when his wife is with him.  We will continue to work on coping skills for helping with his anxiety and the progression of his Lewy body dementia diagnosis as well as processing his grief. 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  Speech/Language:  Normal Rate  Affect: Appropriate  Mood: normal  Thought process: normal  Thought content:   WNL  Sensory/Perceptual disturbances:   WNL  Orientation: oriented to person, place, and situation  Attention: Good  Concentration: Good  Memory: Impaired short term  Fund of knowledge:  Good  Insight:   Good  Judgment:  Good  Impulse Control: Good   Risk Assessment: Danger to Self:  No Self-injurious Behavior: No Danger to Others: No Duty to Warn:no Physical Aggression / Violence:No  Access to Firearms a concern: No  Gang Involvement:No  Treatment plan: Will employ cognitive behavioral therapy as well as grief and loss therapy for relief of anxiety and grief.  Goals of grief therapy or to have a healthier response to the loss of his daughter and less sadness as evidenced by patient report in therapy notes as well as to help him feel less overwhelmed by her loss.  Goals are to see a 50% reduction in grief symptoms over the next 6 months.  I will encourage the use of the patient telling his grief story about his daughter including encouraging him to bring pictures and memorabilia to help process his feelings including any feelings of guilt associated with her loss.  We will use cognitive behavioral therapy to help identify and change anxiety producing thoughts and behavior patterns so as to improve his ability to better manage anxiety and stress, and manage thoughts and worrisome thinking contributing to anxiety.  We will also refresh and encourage dialectical behavior therapy distress tolerance and mindfulness skills with the intention of reducing  anxiety by 50% over the next 6 months. Progress: 30% Interventions: Cognitive Behavioral Therapy  Diagnosis:PTSD, Bi-Polar D/O  Plan:  F/U/ appointments scheduled weekly.  [name], [facility]                                                                                                                  [name], [facility]  [facility] Counselor/Therapist Progress Note  Patient ID: Delshawn Stech, MRN: [id number],    Date: 09/04/2023  Time Spent: 60 minutes spent via video session. [place]. [place] [facility] and this therapist was in his home office. Treatment Type: Individual Therapy Reported Symptoms: anxiety, panic attacks The patient reports that he has been struggling with some of the same thoughts continually over the past few weeks.  Some of them are thoughts that he has dealt with in the past and has not necessarily gotten resolution or answers for but he says they continue to be something he thinks about and they have affected his quality of life and his sleep recently.  We looked at the root cause behind some of these thoughts and why they are being continued and the anxiety that they are creating.  We looked at ways to try to get these thoughts answered in some way and the best way to present those questions but we also looked at what the patient does with those answers if they are not what he expects or if he does not get a clear answer at all.  We talked about the importance of coping with some of the feelings associated with these thoughts but also how to cognitively reframe or challenge these thoughts if necessary. We will continue to work on coping skills for helping with his anxiety and the progression of his Lewy body dementia diagnosis as well as processing his grief. Mental Status Exam: Appearance:  Well Groomed     Behavior: Appropriate  Motor: Pt.  Reports some instability due to Lewey Body dementia  Speech/Language:  Normal Rate  Affect: Appropriate  Mood: normal  Thought process: normal  Thought content:   WNL  Sensory/Perceptual disturbances:   WNL  Orientation: oriented to person, place, and situation  Attention: Good  Concentration: Good  Memory: Impaired short term  Fund of knowledge:  Good  Insight:   Good  Judgment:  Good  Impulse Control: Good   Risk Assessment: Danger to Self:  No Self-injurious Behavior: No Danger to Others: No Duty to Warn:no Physical Aggression / Violence:No  Access to Firearms a concern: No  Gang Involvement:No  Treatment plan: Will employ cognitive behavioral therapy as well as grief and loss therapy for relief of anxiety and grief.  Goals of grief therapy or to have a healthier response to the loss of his daughter and less sadness as evidenced by patient report in therapy notes as well as to help him feel less overwhelmed by her loss.  Goals are to see a 50% reduction in grief symptoms over the next 6 months.  I will encourage the use of the patient telling his grief story about his daughter including encouraging him to bring pictures  and memorabilia to help process his feelings including any feelings of guilt associated with her loss.  We will use cognitive behavioral therapy to help identify and change anxiety producing thoughts and behavior patterns so as to improve his ability to better manage anxiety and stress, and manage thoughts and worrisome thinking contributing to anxiety.  We will also refresh and encourage dialectical behavior therapy distress tolerance and mindfulness skills with the intention of reducing anxiety by 50% over the next 6 months. Progress: 30% Interventions: Cognitive Behavioral Therapy  Diagnosis:PTSD, Bi-Polar D/O  Plan: F/U/ appointments scheduled weekly.  [name],  [facility]                                                                                                     [facility] Counselor/Therapist Progress Note  Patient ID: Kendyn Zaman, MRN: [id number],    Date: 09/04/2023  Time Spent: 60 minutes spent in person with the patient. Treatment Type: Individual Therapy Reported Symptoms: anxiety, panic attacks The patient continues to struggle with short-term memory.  He is changing some of his providers in the area as he did not get results back after several weeks with minimal response.  He continues to be medication compliant.  He did spend some time with his son, daughter-in-law and grandson and says he is becoming more comfortable with his grandson.  He is also spending some time with his brothers over the next few days which he says is good for his anxiety.  His wife will be retiring next month and so they will be spending more time together.  We did talk more about his daughter and some of his feelings associated with her death.  He continues to grieve in a healthy way.  Lately he has been looking at some of her art work and recognizing the talent that he already knew she had but appreciates even more so now.  There have also been a couple of former coworkers who have reached out to connect to him which she has appreciated.  I encouraged continued use of his coping skills for anxiety reduction.  He continues to walk daily, fish at a private pond where no one else is when he can especially on Sundays when his wife is with him.  We will continue to work on coping skills for helping with his anxiety and the progression of his Lewy body dementia diagnosis as well as processing his grief. 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  Speech/Language:   Normal Rate  Affect: Appropriate  Mood: normal  Thought process: normal  Thought content:   WNL  Sensory/Perceptual disturbances:   WNL  Orientation: oriented to person, place, and situation  Attention: Good  Concentration: Good  Memory: Impaired short term  Fund of knowledge:  Good  Insight:   Good  Judgment:  Good  Impulse Control: Good   Risk Assessment: Danger to Self:  No Self-injurious Behavior: No Danger to Others: No Duty to Warn:no Physical  Aggression / Violence:No  Access to Firearms a concern: No  Gang Involvement:No  Treatment plan: Will employ cognitive behavioral therapy as well as grief and loss therapy for relief of anxiety and grief.  Goals of grief therapy or to have a healthier response to the loss of his daughter and less sadness as evidenced by patient report in therapy notes as well as to help him feel less overwhelmed by her loss.  Goals are to see a 50% reduction in grief symptoms over the next 6 months.  I will encourage the use of the patient telling his grief story about his daughter including encouraging him to bring pictures and memorabilia to help process his feelings including any feelings of guilt associated with her loss.  We will use cognitive behavioral therapy to help identify and change anxiety producing thoughts and behavior patterns so as to improve his ability to better manage anxiety and stress, and manage thoughts and worrisome thinking contributing to anxiety.  We will also refresh and encourage dialectical behavior therapy distress tolerance and mindfulness skills with the intention of reducing anxiety by 50% over the next 6 months. Progress: 30% Interventions: Cognitive Behavioral Therapy  Diagnosis:PTSD, Bi-Polar D/O  Plan: F/U/ appointments scheduled weekly.  [name],  [facility]                                                                                     [facility] Counselor/Therapist Progress Note  Patient ID: Urbano Milhouse, MRN: [id number],    Date: 09/04/2023 This session was held via video teletherapy. The patient consented to the video teletherapy and was located in his home during this session. He is aware it is the responsibility of the patient to secure confidentiality on his end of the session. The provider was in a private home office for the duration of this session.    The patient arrived on time for her Caregility session  Time Spent: 57 minutes, 2:00 PM to 2:57 PM  Treatment Type: Individual Therapy Reported Symptoms: anxiety, panic attacks  The patient reports feeling fairly blah over the past week.  He is going to physical therapy and that is helping somewhat balance as well as pain reduction in his knees.  His ankle is getting better but he can tell when he walks too much and starts to swell and is uncomfortable.  His back is feeling better.  There are still a few nights a week where he is having difficulty slowing his brain down and sleeping because he is angry.  We looked at what and who he was angry at and what I am processed those feelings associated with ways to help alleviate that cognitively and emotionally.  He and his wife are looking at possibly going to [facility] over Christmas because that is an area of their daughter left a lot.  He said they would probably sprinkle some ashes there.  Holidays are difficult and he is spending Thanksgiving day with his son and daughter-in-law and grandson but they may go to a beach close by for a few days over Thanksgiving.  He still feels as if he is grieving his daughter  fairly well.  He reports that he feels that he is grieving a little bit a change in his diagnosis.  He recognizes that his  memory is getting worse especially in terms of recall.  He thinks that clearly but has a difficult time getting from brain to mouth but eventually does.  Of frustration with how slow that is.  He is more frustrated with the fact that he gets confused more often especially with where he is going.  He recognizes that being fairly new to the area over the last couple years he does not recognize everything with says he is starting to even when he is in a place he knows have to stop and think more about where he is going.  He also feels at times that he is very alone and this medical fight.  He knows that he has family and friends in support and we looked at how others not understanding fully his struggle must feel very lonely to him.  In spite of that he remains positive and is using coping skills and laughter.  He does contract for safety having no thoughts of hurting himself or anyone else. 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  Speech/Language:  Normal Rate  Affect: Appropriate  Mood: normal  Thought process: normal  Thought content:   WNL  Sensory/Perceptual disturbances:   WNL  Orientation: oriented to person, place, and situation  Attention: Good  Concentration: Good  Memory: Impaired short term  Fund of knowledge:  Good  Insight:   Good  Judgment:  Good  Impulse Control: Good   Risk Assessment: Danger to Self:  No Self-injurious Behavior: No Danger to Others: No Duty to Warn:no Physical Aggression / Violence:No  Access to Firearms a concern: No  Gang Involvement:No  Treatment plan: Will employ cognitive behavioral therapy as well as grief and loss therapy for relief of anxiety and grief.  Goals of grief therapy or to have a healthier response to the loss of his daughter and less sadness as evidenced by patient report in therapy notes as well as to help him feel less overwhelmed by her  loss.  Goals are to see a 50% reduction in grief symptoms over the next 6 months.  I will encourage the use of the patient telling his grief story about his daughter including encouraging him to bring pictures and memorabilia to help process his feelings including any feelings of guilt associated with her loss.  We will use cognitive behavioral therapy to help identify and change anxiety producing thoughts and behavior patterns so as to improve his ability to better manage anxiety and stress, and manage thoughts and worrisome thinking contributing to anxiety.  We will also refresh and encourage dialectical behavior therapy distress tolerance and mindfulness skills with the intention of reducing anxiety by 50% over the next 6 months. Progress: 30% Of the original target date was July 05, 2023.  I have reviewed the treatment goals and the patient would like to continue with the initial goals with the new target date of January 02, 2024. Diagnosis:PTSD, Bi-Polar D/O  Plan: F/U/ appointments scheduled weekly.  [name], [facility]  [name], [facility]               [name], [facility]               [name], [facility]               [name], [facility]               [name], [facility]               [name], [facility]               [name], [facility]               [name], [facility]               [name], [facility]               [name], [facility]               [name], [facility]               [name], [facility]               [name], [facility]               [name], [facility]               [name], [facility]

## 2023-09-11 ENCOUNTER — Encounter: Payer: Self-pay | Admitting: Behavioral Health

## 2023-09-11 ENCOUNTER — Ambulatory Visit (INDEPENDENT_AMBULATORY_CARE_PROVIDER_SITE_OTHER): Payer: Medicare Other | Admitting: Behavioral Health

## 2023-09-11 DIAGNOSIS — F3181 Bipolar II disorder: Secondary | ICD-10-CM | POA: Diagnosis not present

## 2023-09-11 DIAGNOSIS — F4310 Post-traumatic stress disorder, unspecified: Secondary | ICD-10-CM

## 2023-09-11 NOTE — Progress Notes (Signed)
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 to try to get these thoughts answered in some  way and the best way to present those questions but we also looked at what the patient does with those answers if they are not what he expects or if he does not get a clear answer  at all.  We talked about the importance of coping with some of the feelings associated with these thoughts but also how to cognitively reframe or challenge these thoughts if necessary. We will continue to work on coping skills for helping with his anxiety and the progression of his Lewy body dementia diagnosis as well as processing his grief. Mental Status Exam: Appearance:  Well Groomed     Behavior: Appropriate  Motor: Pt. Reports some instability due to Lewey Body dementia  Speech/Language:  Normal Rate  Affect: Appropriate  Mood: normal  Thought process: normal  Thought content:   WNL  Sensory/Perceptual disturbances:   WNL  Orientation: oriented to person, place, and situation  Attention: Good  Concentration: Good  Memory: Impaired short term  Fund of knowledge:  Good  Insight:   Good  Judgment:  Good  Impulse Control: Good   Risk Assessment: Danger to Self:  No Self-injurious Behavior: No Danger to Others: No Duty to Warn:no Physical Aggression / Violence:No  Access to Firearms a concern: No  Gang Involvement:No  Treatment plan: Will employ cognitive behavioral therapy as well as grief and loss therapy for relief of anxiety and grief.  Goals of grief therapy or to have a healthier response to the loss of his daughter and less sadness as evidenced by patient report in therapy notes as well as to help him feel less overwhelmed by her loss.  Goals are to see a 50% reduction in grief symptoms over the next 6 months.  I will encourage the use of the patient telling his grief story about his daughter including encouraging him to bring pictures and memorabilia to help process his feelings including any feelings of guilt associated with her loss.  We will use cognitive behavioral therapy to help identify and  change anxiety producing thoughts and behavior patterns so as to improve his ability to better manage anxiety and stress, and manage thoughts and worrisome thinking contributing to anxiety.  We will also refresh and encourage dialectical behavior therapy distress tolerance and mindfulness skills with the intention of reducing anxiety by 50% over the next 6 months. Progress: 30% Interventions: Cognitive Behavioral Therapy  Diagnosis:PTSD, Bi-Polar D/O  Plan: F/U/ appointments scheduled weekly.  [name], [facility]                                                                                                     [facility] Counselor/Therapist Progress Note  Patient ID: Jaquis Dupree, MRN: [id number],    Date: 09/11/2023  Time Spent: 60 minutes spent in person with the patient. Treatment Type: Individual Therapy Reported Symptoms: anxiety, panic attacks The patient continues to struggle with short-term memory.  He is changing some of his providers in the area as he did not get results back after several weeks with minimal response.  He continues to be medication compliant.  He did spend some time with his son, daughter-in-law and grandson and says he is becoming more comfortable with his grandson.  He is also spending some time with his brothers over the next few days  which he says is good for his anxiety.  His wife will be retiring next month and so they will be spending more time together.  We did talk more about his daughter and some of his feelings associated with her death.  He continues to grieve in a healthy way.  Lately he has been looking at some of her art work and recognizing the talent that he already knew she had but appreciates even more so now.  There have also been a couple of former coworkers who have reached out to connect to him which she has appreciated.  I  encouraged continued use of his coping skills for anxiety reduction.  He continues to walk daily, fish at a private pond where no one else is when he can especially on Sundays when his wife is with him.  We will continue to work on coping skills for helping with his anxiety and the progression of his Lewy body dementia diagnosis as well as processing his grief. Mental Status Exam: Appearance:  Well Groomed     Behavior: Appropriate  Motor: Pt. Reports some instability due to Lewey Body dementia  Speech/Language:  Normal Rate  Affect: Appropriate  Mood: normal  Thought process: normal  Thought content:   WNL  Sensory/Perceptual disturbances:   WNL  Orientation: oriented to person, place, and situation  Attention: Good  Concentration: Good  Memory: Impaired short term  Fund of knowledge:  Good  Insight:   Good  Judgment:  Good  Impulse Control: Good   Risk Assessment: Danger to Self:  No Self-injurious Behavior: No Danger to Others: No Duty to Warn:no Physical Aggression / Violence:No  Access to Firearms a concern: No  Gang Involvement:No  Treatment plan: Will employ cognitive behavioral therapy as well as grief and loss therapy for relief of anxiety and grief.  Goals of grief therapy or to have a healthier response to the loss of his daughter and less sadness as evidenced by patient report in therapy notes as well as to help him feel less overwhelmed by her loss.  Goals are to see a 50% reduction in grief symptoms over the next 6 months.  I will encourage the use of the patient telling his grief story about his daughter including encouraging him to bring pictures and memorabilia to help process his feelings including any feelings of guilt associated with her loss.  We will use cognitive behavioral therapy to help identify and change anxiety producing thoughts and behavior patterns so as to improve his ability to better manage anxiety and stress, and manage thoughts and worrisome thinking  contributing to anxiety.  We will also refresh and encourage dialectical behavior therapy distress tolerance and mindfulness skills with the intention of reducing anxiety by 50% over the next 6 months. Progress: 30% Interventions: Cognitive Behavioral Therapy  Diagnosis:PTSD, Bi-Polar D/O  Plan: F/U/ appointments scheduled weekly.  [name], [facility]                                                                                                     [facility]  Counselor/Therapist Progress Note  Patient ID: Shaman Maley, MRN: [id number],    Date: 09/11/2023  Time Spent: 60 minutes spent in person with the patient. Treatment Type: Individual Therapy Reported Symptoms: anxiety, panic attacks The patient continues to struggle with short-term memory.  He is changing some of his providers in the area as he did not get results back after several weeks with minimal response.  He continues to be medication compliant.  He did spend some time with his son, daughter-in-law and grandson and says he is becoming more comfortable with his grandson.  He is also spending some time with his brothers over the next few days which he says is good for his anxiety.  His wife will be retiring next month and so they will be spending more time together.  We did talk more about his daughter and some of his feelings associated with her death.  He continues to grieve in a healthy way.  Lately he has been looking at some of her art work and recognizing the talent that he already knew she had but appreciates even more so now.  There have also been a couple of former coworkers who have reached out to connect to him which she has appreciated.  I encouraged continued use of his coping skills for anxiety reduction.  He continues to walk daily, fish at a private pond where no one else is when he can especially on  Sundays when his wife is with him.  We will continue to work on coping skills for helping with his anxiety and the progression of his Lewy body dementia diagnosis as well as processing his grief. Mental Status Exam: Appearance:  Well Groomed     Behavior: Appropriate  Motor: Pt. Reports some instability due to Lewey Body dementia  Speech/Language:  Normal Rate  Affect: Appropriate  Mood: normal  Thought process: normal  Thought content:   WNL  Sensory/Perceptual disturbances:   WNL  Orientation: oriented to person, place, and situation  Attention: Good  Concentration: Good  Memory: Impaired short term  Fund of knowledge:  Good  Insight:   Good  Judgment:  Good  Impulse Control: Good   Risk Assessment: Danger to Self:  No Self-injurious Behavior: No Danger to Others: No Duty to Warn:no Physical Aggression / Violence:No  Access to Firearms a concern: No  Gang Involvement:No  Treatment plan: Will employ cognitive behavioral therapy as well as grief and loss therapy for relief of anxiety and grief.  Goals of grief therapy or to have a healthier response to the loss of his daughter and less sadness as evidenced by patient report in therapy notes as well as to help him feel less overwhelmed by her loss.  Goals are to see a 50% reduction in grief symptoms over the next 6 months.  I will encourage the use of the patient telling his grief story about his daughter including encouraging him to bring pictures and memorabilia to help process his feelings including any feelings of guilt associated with her loss.  We will use cognitive behavioral therapy to help identify and change anxiety producing thoughts and behavior patterns so as to improve his ability to better manage anxiety and stress, and manage thoughts and worrisome thinking contributing to anxiety.  We will also refresh and encourage dialectical behavior therapy distress tolerance and mindfulness skills with the intention of reducing  anxiety by 50% over the next 6 months. Progress: 30% Interventions: Cognitive Behavioral Therapy  Diagnosis:PTSD, Bi-Polar D/O  Plan:  F/U/ appointments scheduled weekly.  [name], [facility]                                                                                                                  [name], [facility]  [facility] Counselor/Therapist Progress Note  Patient ID: Estle Gilford, MRN: [id number],    Date: 09/11/2023  Time Spent: 60 minutes spent via video session. The [place]. [place] and this therapist was in his home office. Treatment Type: Individual Therapy Reported Symptoms: anxiety, panic attacks The patient reports that he has been struggling with some of the same thoughts continually over the past few weeks.  Some of them are thoughts that he has dealt with in the past and has not necessarily gotten resolution or answers for but he says they continue to be something he thinks about and they have affected his quality of life and his sleep recently.  We looked at the root cause behind some of these thoughts and why they are being continued and the anxiety that they are creating.  We looked at ways to try to get these thoughts answered in some way and the best way to present those questions but we also looked at what the patient does with those answers if they are not what he expects or if he does not get a clear answer at all.  We talked about the importance of coping with some of the feelings associated with these thoughts but also how to cognitively reframe or challenge these thoughts if necessary. We will continue to work on coping skills for helping with his anxiety and the progression of his Lewy body dementia diagnosis as well as processing his grief. Mental Status Exam: Appearance:  Well Groomed     Behavior: Appropriate  Motor: Pt.  Reports some instability due to Lewey Body dementia  Speech/Language:  Normal Rate  Affect: Appropriate  Mood: normal  Thought process: normal  Thought content:   WNL  Sensory/Perceptual disturbances:   WNL  Orientation: oriented to person, place, and situation  Attention: Good  Concentration: Good  Memory: Impaired short term  Fund of knowledge:  Good  Insight:   Good  Judgment:  Good  Impulse Control: Good   Risk Assessment: Danger to Self:  No Self-injurious Behavior: No Danger to Others: No Duty to Warn:no Physical Aggression / Violence:No  Access to Firearms a concern: No  Gang Involvement:No  Treatment plan: Will employ cognitive behavioral therapy as well as grief and loss therapy for relief of anxiety and grief.  Goals of grief therapy or to have a healthier response to the loss of his daughter and less sadness as evidenced by patient report in therapy notes as well as to help him feel less overwhelmed by her loss.  Goals are to see a 50% reduction in grief symptoms over the next 6 months.  I will encourage the use of the patient telling his grief story about his daughter including encouraging him to bring pictures  and memorabilia to help process his feelings including any feelings of guilt associated with her loss.  We will use cognitive behavioral therapy to help identify and change anxiety producing thoughts and behavior patterns so as to improve his ability to better manage anxiety and stress, and manage thoughts and worrisome thinking contributing to anxiety.  We will also refresh and encourage dialectical behavior therapy distress tolerance and mindfulness skills with the intention of reducing anxiety by 50% over the next 6 months. Progress: 30% Interventions: Cognitive Behavioral Therapy  Diagnosis:PTSD, Bi-Polar D/O  Plan: F/U/ appointments scheduled weekly.  [name],  [facility]                                                                                                     [facility] Counselor/Therapist Progress Note  Patient ID: Jerson Kittle, MRN: [id number],    Date: 09/11/2023  Time Spent: 60 minutes spent in person with the patient. Treatment Type: Individual Therapy Reported Symptoms: anxiety, panic attacks The patient continues to struggle with short-term memory.  He is changing some of his providers in the area as he did not get results back after several weeks with minimal response.  He continues to be medication compliant.  He did spend some time with his son, daughter-in-law and grandson and says he is becoming more comfortable with his grandson.  He is also spending some time with his brothers over the next few days which he says is good for his anxiety.  His wife will be retiring next month and so they will be spending more time together.  We did talk more about his daughter and some of his feelings associated with her death.  He continues to grieve in a healthy way.  Lately he has been looking at some of her art work and recognizing the talent that he already knew she had but appreciates even more so now.  There have also been a couple of former coworkers who have reached out to connect to him which she has appreciated.  I encouraged continued use of his coping skills for anxiety reduction.  He continues to walk daily, fish at a private pond where no one else is when he can especially on Sundays when his wife is with him.  We will continue to work on coping skills for helping with his anxiety and the progression of his Lewy body dementia diagnosis as well as processing his grief. Mental Status Exam: Appearance:  Well Groomed     Behavior: Appropriate  Motor: Pt. Reports some instability due to Lewey Body dementia  Speech/Language:   Normal Rate  Affect: Appropriate  Mood: normal  Thought process: normal  Thought content:   WNL  Sensory/Perceptual disturbances:   WNL  Orientation: oriented to person, place, and situation  Attention: Good  Concentration: Good  Memory: Impaired short term  Fund of knowledge:  Good  Insight:   Good  Judgment:  Good  Impulse Control: Good   Risk Assessment: Danger to Self:  No Self-injurious Behavior: No Danger to Others: No Duty to Warn:no Physical  Aggression / Violence:No  Access to Firearms a concern: No  Gang Involvement:No  Treatment plan: Will employ cognitive behavioral therapy as well as grief and loss therapy for relief of anxiety and grief.  Goals of grief therapy or to have a healthier response to the loss of his daughter and less sadness as evidenced by patient report in therapy notes as well as to help him feel less overwhelmed by her loss.  Goals are to see a 50% reduction in grief symptoms over the next 6 months.  I will encourage the use of the patient telling his grief story about his daughter including encouraging him to bring pictures and memorabilia to help process his feelings including any feelings of guilt associated with her loss.  We will use cognitive behavioral therapy to help identify and change anxiety producing thoughts and behavior patterns so as to improve his ability to better manage anxiety and stress, and manage thoughts and worrisome thinking contributing to anxiety.  We will also refresh and encourage dialectical behavior therapy distress tolerance and mindfulness skills with the intention of reducing anxiety by 50% over the next 6 months. Progress: 30% Interventions: Cognitive Behavioral Therapy  Diagnosis:PTSD, Bi-Polar D/O  Plan: F/U/ appointments scheduled weekly.  [name],  [facility]                                                                                     [facility] Counselor/Therapist Progress Note  Patient ID: Iaan Krabill, MRN: [id number],    Date: 09/11/2023 This session was held via video teletherapy. The patient consented to the video teletherapy and was located in his home during this session. He is aware it is the responsibility of the patient to secure confidentiality on his end of the session. The provider was in a private home office for the duration of this session.    The patient arrived on time for her Caregility session  Time Spent: 57 minutes, 2:00 PM to 2:57 PM  Treatment Type: Individual Therapy Reported Symptoms: anxiety, panic attacks  The patient reports feeling fairly blah over the past week.  He is going to physical therapy and that is helping somewhat balance as well as pain reduction in his knees.  His ankle is getting better but he can tell when he walks too much and starts to swell and is uncomfortable.  His back is feeling better.  There are still a few nights a week where he is having difficulty slowing his brain down and sleeping because he is angry.  We looked at what and who he was angry at and what I am processed those feelings associated with ways to help alleviate that cognitively and emotionally.  He and his wife are looking at possibly going to [facility] over Christmas because that is an area of their daughter left a lot.  He said they would probably sprinkle some ashes there.  Holidays are difficult and he is spending Thanksgiving day with his son and daughter-in-law and grandson but they may go to a beach close by for a few days over Thanksgiving.  He still feels as if he is grieving his daughter  fairly well.  He reports that he feels that he is grieving a little bit a change in his diagnosis.  He recognizes that his  memory is getting worse especially in terms of recall.  He thinks that clearly but has a difficult time getting from brain to mouth but eventually does.  Of frustration with how slow that is.  He is more frustrated with the fact that he gets confused more often especially with where he is going.  He recognizes that being fairly new to the area over the last couple years he does not recognize everything with says he is starting to even when he is in a place he knows have to stop and think more about where he is going.  He also feels at times that he is very alone and this medical fight.  He knows that he has family and friends in support and we looked at how others not understanding fully his struggle must feel very lonely to him.  In spite of that he remains positive and is using coping skills and laughter.  He does contract for safety having no thoughts of hurting himself or anyone else. Mental Status Exam: Appearance:  Well Groomed     Behavior: Appropriate  Motor: Pt. Reports some instability due to Lewey Body dementia  Speech/Language:  Normal Rate  Affect: Appropriate  Mood: normal  Thought process: normal  Thought content:   WNL  Sensory/Perceptual disturbances:   WNL  Orientation: oriented to person, place, and situation  Attention: Good  Concentration: Good  Memory: Impaired short term  Fund of knowledge:  Good  Insight:   Good  Judgment:  Good  Impulse Control: Good   Risk Assessment: Danger to Self:  No Self-injurious Behavior: No Danger to Others: No Duty to Warn:no Physical Aggression / Violence:No  Access to Firearms a concern: No  Gang Involvement:No  Treatment plan: Will employ cognitive behavioral therapy as well as grief and loss therapy for relief of anxiety and grief.  Goals of grief therapy or to have a healthier response to the loss of his daughter and less sadness as evidenced by patient report in therapy notes as well as to help him feel less overwhelmed by her  loss.  Goals are to see a 50% reduction in grief symptoms over the next 6 months.  I will encourage the use of the patient telling his grief story about his daughter including encouraging him to bring pictures and memorabilia to help process his feelings including any feelings of guilt associated with her loss.  We will use cognitive behavioral therapy to help identify and change anxiety producing thoughts and behavior patterns so as to improve his ability to better manage anxiety and stress, and manage thoughts and worrisome thinking contributing to anxiety.  We will also refresh and encourage dialectical behavior therapy distress tolerance and mindfulness skills with the intention of reducing anxiety by 50% over the next 6 months. Progress: 30% Of the original target date was July 05, 2023.  I have reviewed the treatment goals and the patient would like to continue with the initial goals with the new target date of January 02, 2024. Diagnosis:PTSD, Bi-Polar D/O  Plan: F/U/ appointments scheduled weekly.  [name], [facility]  [name], [facility]               [name], [facility]               [name], [facility]               [name], [facility]               [name], [facility]               [name], [facility]               [name], [facility]               [name], [facility]               [name], [facility]               [name], [facility]               [name], [facility]               [name], [facility]               [name], [facility]               [name], [facility]               [name], [facility]               [name], [facility]

## 2023-09-18 ENCOUNTER — Encounter: Payer: Self-pay | Admitting: Behavioral Health

## 2023-09-18 ENCOUNTER — Ambulatory Visit (INDEPENDENT_AMBULATORY_CARE_PROVIDER_SITE_OTHER): Payer: Medicare Other | Admitting: Behavioral Health

## 2023-09-18 DIAGNOSIS — F319 Bipolar disorder, unspecified: Secondary | ICD-10-CM | POA: Diagnosis not present

## 2023-09-18 DIAGNOSIS — F3181 Bipolar II disorder: Secondary | ICD-10-CM

## 2023-09-18 DIAGNOSIS — F4310 Post-traumatic stress disorder, unspecified: Secondary | ICD-10-CM

## 2023-09-18 NOTE — Progress Notes (Signed)
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 weeks.   Some of them are thoughts that he has dealt with in the past and has not necessarily gotten resolution or answers for but he says they continue to be something  he thinks about and they have affected his quality of life and his sleep recently.  We looked at the root cause behind some of these thoughts and why they are being continued and the anxiety that they are creating.  We looked at ways to try to get these thoughts answered in some way and the best way to present those questions but we also looked at what the patient does with those answers if they are not what he expects or if he does not get a clear answer at all.  We talked about the importance of coping with some of the feelings associated with these thoughts but also how to cognitively reframe or challenge these thoughts if necessary. We will continue to work on coping skills for helping with his anxiety and the progression of his Lewy body dementia diagnosis as well as processing his grief. Mental Status Exam: Appearance:  Well Groomed     Behavior: Appropriate  Motor: Pt. Reports some instability due to Lewey Body dementia  Speech/Language:  Normal Rate  Affect: Appropriate  Mood: normal  Thought process: normal  Thought content:   WNL  Sensory/Perceptual disturbances:   WNL  Orientation: oriented to person, place, and situation  Attention: Good  Concentration: Good  Memory: Impaired short term  Fund of knowledge:  Good  Insight:   Good  Judgment:  Good  Impulse Control: Good   Risk Assessment: Danger to Self:  No Self-injurious Behavior: No Danger to Others: No Duty to Warn:no Physical Aggression / Violence:No  Access to Firearms a concern: No  Gang Involvement:No  Treatment plan: Will employ cognitive behavioral therapy as well as grief and loss therapy for relief of anxiety and grief.  Goals of grief therapy or to have a healthier response to the loss of his daughter and less sadness as evidenced by patient report in  therapy notes as well as to help him feel less overwhelmed by her loss.  Goals are to see a 50% reduction in grief symptoms over the next 6 months.  I will encourage the use of the patient telling his grief story about his daughter including encouraging him to bring pictures and memorabilia to help process his feelings including any feelings of guilt associated with her loss.  We will use cognitive behavioral therapy to help identify and change anxiety producing thoughts and behavior patterns so as to improve his ability to better manage anxiety and stress, and manage thoughts and worrisome thinking contributing to anxiety.  We will also refresh and encourage dialectical behavior therapy distress tolerance and mindfulness skills with the intention of reducing anxiety by 50% over the next 6 months. Progress: 30% Interventions: Cognitive Behavioral Therapy  Diagnosis:PTSD, Bi-Polar D/O  Plan: F/U/ appointments scheduled weekly.  [name], [facility]                                                                                                      Behavioral Health Counselor/Therapist Progress Note  Patient ID: Stephen Myers, MRN: [id number],    Date: 09/18/2023  Time Spent: 60 minutes  spent in person with the patient. Treatment Type: Individual Therapy Reported Symptoms: anxiety, panic attacks The patient continues to struggle with short-term memory.  He is changing some of his providers in the area as he did not get results back after several weeks with minimal response.  He continues to be medication compliant.  He did spend some time with his son, daughter-in-law and grandson and says he is becoming more comfortable with his grandson.  He is also spending some time with his brothers over the next few days which he says is good for his anxiety.  His wife will be retiring next month and  so they will be spending more time together.  We did talk more about his daughter and some of his feelings associated with her death.  He continues to grieve in a healthy way.  Lately he has been looking at some of her art work and recognizing the talent that he already knew she had but appreciates even more so now.  There have also been a couple of former coworkers who have reached out to connect to him which she has appreciated.  I encouraged continued use of his coping skills for anxiety reduction.  He continues to walk daily, fish at a private pond where no one else is when he can especially on Sundays when his wife is with him.  We will continue to work on coping skills for helping with his anxiety and the progression of his Lewy body dementia diagnosis as well as processing his grief. Mental Status Exam: Appearance:  Well Groomed     Behavior: Appropriate  Motor: Pt. Reports some instability due to Lewey Body dementia  Speech/Language:  Normal Rate  Affect: Appropriate  Mood: normal  Thought process: normal  Thought content:   WNL  Sensory/Perceptual disturbances:   WNL  Orientation: oriented to person, place, and situation  Attention: Good  Concentration: Good  Memory: Impaired short term  Fund of knowledge:  Good  Insight:   Good  Judgment:  Good  Impulse Control: Good   Risk Assessment: Danger to Self:  No Self-injurious Behavior: No Danger to Others: No Duty to Warn:no Physical Aggression / Violence:No  Access to Firearms a concern: No  Gang Involvement:No  Treatment plan: Will employ cognitive behavioral therapy as well as grief and loss therapy for relief of anxiety and grief.  Goals of grief therapy or to have a healthier response to the loss of his daughter and less sadness as evidenced by patient report in therapy notes as well as to help him feel less overwhelmed by her loss.  Goals are to see a 50% reduction in grief symptoms over the next 6 months.  I will encourage  the use of the patient telling his grief story about his daughter including encouraging him to bring pictures and memorabilia to help process his feelings including any feelings of guilt associated with her loss.  We will use cognitive behavioral therapy to help identify and change anxiety producing thoughts and behavior patterns so as to improve his ability to better manage anxiety and stress, and manage thoughts and worrisome thinking contributing to anxiety.  We will also refresh and encourage dialectical behavior therapy distress tolerance and mindfulness skills with the intention of reducing anxiety by 50% over the next 6 months. Progress: 30% Interventions: Cognitive Behavioral Therapy  Diagnosis:PTSD, Bi-Polar D/O  Plan: F/U/ appointments scheduled weekly.  [name], [facility]  [place] [facility] Counselor/Therapist Progress Note  Patient ID: Stephen Myers, MRN: [id number],    Date: 09/18/2023  Time Spent: 60 minutes spent in person with the patient. Treatment Type: Individual Therapy Reported Symptoms: anxiety, panic attacks The patient continues to struggle with short-term memory.  He is changing some of his providers in the area as he did not get results back after several weeks with minimal response.  He continues to be medication compliant.  He did spend some time with his son, daughter-in-law and grandson and says he is becoming more comfortable with his grandson.  He is also spending some time with his brothers over the next few days which he says is good for his anxiety.  His wife will be retiring next month and so they will be spending more time together.  We did talk more about his daughter and some of his feelings associated with her death.  He continues to grieve in a  healthy way.  Lately he has been looking at some of her art work and recognizing the talent that he already knew she had but appreciates even more so now.  There have also been a couple of former coworkers who have reached out to connect to him which she has appreciated.  I encouraged continued use of his coping skills for anxiety reduction.  He continues to walk daily, fish at a private pond where no one else is when he can especially on Sundays when his wife is with him.  We will continue to work on coping skills for helping with his anxiety and the progression of his Lewy body dementia diagnosis as well as processing his grief. Mental Status Exam: Appearance:  Well Groomed     Behavior: Appropriate  Motor: Pt. Reports some instability due to Lewey Body dementia  Speech/Language:  Normal Rate  Affect: Appropriate  Mood: normal  Thought process: normal  Thought content:   WNL  Sensory/Perceptual disturbances:   WNL  Orientation: oriented to person, place, and situation  Attention: Good  Concentration: Good  Memory: Impaired short term  Fund of knowledge:  Good  Insight:   Good  Judgment:  Good  Impulse Control: Good   Risk Assessment: Danger to Self:  No Self-injurious Behavior: No Danger to Others: No Duty to Warn:no Physical Aggression / Violence:No  Access to Firearms a concern: No  Gang Involvement:No  Treatment plan: Will employ cognitive behavioral therapy as well as grief and loss therapy for relief of anxiety and grief.  Goals of grief therapy or to have a healthier response to the loss of his daughter and less sadness as evidenced by patient report in therapy notes as well as to help him feel less overwhelmed by her loss.  Goals are to see a 50% reduction in grief symptoms over the next 6 months.  I will encourage the use of the patient telling his grief story about his daughter including encouraging him to bring pictures and memorabilia to help process his feelings including  any feelings of guilt associated with her loss.  We will use cognitive behavioral therapy to help identify and change anxiety producing thoughts and behavior patterns so as to improve his ability to better manage anxiety and stress, and manage thoughts and worrisome thinking contributing to anxiety.  We will also refresh and encourage dialectical behavior therapy distress tolerance and mindfulness skills with the intention of reducing anxiety by 50% over the next 6 months. Progress: 30% Interventions: Cognitive Behavioral Therapy  Diagnosis:PTSD, Bi-Polar  D/O  Plan: F/U/ appointments scheduled weekly.  [name], [facility]                                                                                                                  [name], [facility]  [place] [facility] Counselor/Therapist Progress Note  Patient ID: Stephen Myers, MRN: [id number],    Date: 09/18/2023  Time Spent: 60 minutes spent via video session. The pt. Was at [facility]me and this therapist was in his home office. Treatment Type: Individual Therapy Reported Symptoms: anxiety, panic attacks The patient reports that he has been struggling with some of the same thoughts continually over the past few weeks.  Some of them are thoughts that he has dealt with in the past and has not necessarily gotten resolution or answers for but he says they continue to be something he thinks about and they have affected his quality of life and his sleep recently.  We looked at the root cause behind some of these thoughts and why they are being continued and the anxiety that they are creating.  We looked at ways to try to get these thoughts answered in some way and the best way to present those questions but we also looked at what the patient does with those answers if they are not what he expects or if he does not get a clear  answer at all.  We talked about the importance of coping with some of the feelings associated with these thoughts but also how to cognitively reframe or challenge these thoughts if necessary. We will continue to work on coping skills for helping with his anxiety and the progression of his Lewy body dementia diagnosis as well as processing his grief. Mental Status Exam: Appearance:  Well Groomed     Behavior: Appropriate  Motor: Pt. Reports some instability due to Lewey Body dementia  Speech/Language:  Normal Rate  Affect: Appropriate  Mood: normal  Thought process: normal  Thought content:   WNL  Sensory/Perceptual disturbances:   WNL  Orientation: oriented to person, place, and situation  Attention: Good  Concentration: Good  Memory: Impaired short term  Fund of knowledge:  Good  Insight:   Good  Judgment:  Good  Impulse Control: Good   Risk Assessment: Danger to Self:  No Self-injurious Behavior: No Danger to Others: No Duty to Warn:no Physical Aggression / Violence:No  Access to Firearms a concern: No  Gang Involvement:No  Treatment plan: Will employ cognitive behavioral therapy as well as grief and loss therapy for relief of anxiety and grief.  Goals of grief therapy or to have a healthier response to the loss of his daughter and less sadness as evidenced by patient report in therapy notes as well as to help him feel less overwhelmed by her loss.  Goals are to see a 50% reduction in grief symptoms over the next 6 months.  I will encourage the use of the patient telling his grief story about his daughter including encouraging him  to bring pictures and memorabilia to help process his feelings including any feelings of guilt associated with her loss.  We will use cognitive behavioral therapy to help identify and change anxiety producing thoughts and behavior patterns so as to improve his ability to better manage anxiety and stress, and manage thoughts and worrisome thinking contributing  to anxiety.  We will also refresh and encourage dialectical behavior therapy distress tolerance and mindfulness skills with the intention of reducing anxiety by 50% over the next 6 months. Progress: 30% Interventions: Cognitive Behavioral Therapy  Diagnosis:PTSD, Bi-Polar D/O  Plan: F/U/ appointments scheduled weekly.  [name], [facility]                                                                                                     [place] [facility] Counselor/Therapist Progress Note  Patient ID: Stephen Myers, MRN: [id number],    Date: 09/18/2023  Time Spent: 60 minutes spent in person with the patient. Treatment Type: Individual Therapy Reported Symptoms: anxiety, panic attacks The patient continues to struggle with short-term memory.  He is changing some of his providers in the area as he did not get results back after several weeks with minimal response.  He continues to be medication compliant.  He did spend some time with his son, daughter-in-law and grandson and says he is becoming more comfortable with his grandson.  He is also spending some time with his brothers over the next few days which he says is good for his anxiety.  His wife will be retiring next month and so they will be spending more time together.  We did talk more about his daughter and some of his feelings associated with her death.  He continues to grieve in a healthy way.  Lately he has been looking at some of her art work and recognizing the talent that he already knew she had but appreciates even more so now.  There have also been a couple of former coworkers who have reached out to connect to him which she has appreciated.  I encouraged continued use of his coping skills for anxiety reduction.  He continues to walk daily, fish at a private pond where no one else is when he can especially on Sundays when  his wife is with him.  We will continue to work on coping skills for helping with his anxiety and the progression of his Lewy body dementia diagnosis as well as processing his grief. Mental Status Exam: Appearance:  Well Groomed     Behavior: Appropriate  Motor: Pt. Reports some instability due to Lewey Body dementia  Speech/Language:  Normal Rate  Affect: Appropriate  Mood: normal  Thought process: normal  Thought content:   WNL  Sensory/Perceptual disturbances:   WNL  Orientation: oriented to person, place, and situation  Attention: Good  Concentration: Good  Memory: Impaired short term  Fund of knowledge:  Good  Insight:   Good  Judgment:  Good  Impulse Control: Good   Risk Assessment: Danger to Self:  No Self-injurious Behavior: No Danger to Others: No Duty  to Warn:no Physical Aggression / Violence:No  Access to Firearms a concern: No  Gang Involvement:No  Treatment plan: Will employ cognitive behavioral therapy as well as grief and loss therapy for relief of anxiety and grief.  Goals of grief therapy or to have a healthier response to the loss of his daughter and less sadness as evidenced by patient report in therapy notes as well as to help him feel less overwhelmed by her loss.  Goals are to see a 50% reduction in grief symptoms over the next 6 months.  I will encourage the use of the patient telling his grief story about his daughter including encouraging him to bring pictures and memorabilia to help process his feelings including any feelings of guilt associated with her loss.  We will use cognitive behavioral therapy to help identify and change anxiety producing thoughts and behavior patterns so as to improve his ability to better manage anxiety and stress, and manage thoughts and worrisome thinking contributing to anxiety.  We will also refresh and encourage dialectical behavior therapy distress tolerance and mindfulness skills with the intention of reducing anxiety by 50%  over the next 6 months. Progress: 30% Interventions: Cognitive Behavioral Therapy  Diagnosis:PTSD, Bi-Polar D/O  Plan: F/U/ appointments scheduled weekly.  [name], [facility]                                                                                     [place] [facility] Counselor/Therapist Progress Note  Patient ID: Stephen Myers, MRN: [id number],    Date: 09/18/2023 This session was held via video teletherapy. The patient consented to the video teletherapy and was located in his home during this session. He is aware it is the responsibility of the patient to secure confidentiality on his end of the session. The provider was in a private home office for the duration of this session.    The patient arrived on time for her Caregility session  Time Spent: 57 minutes, 2:00 PM to 2:57 PM  Treatment Type: Individual Therapy Reported Symptoms: anxiety, panic attacks  The patient reports feeling fairly blah over the past week.  He is going to physical therapy and that is helping somewhat balance as well as pain reduction in his knees.  His ankle is getting better but he can tell when he walks too much and starts to swell and is uncomfortable.  His back is feeling better.  There are still a few nights a week where he is having difficulty slowing his brain down and sleeping because he is angry.  We looked at what and who he was angry at and what I am processed those feelings associated with ways to help alleviate that cognitively and emotionally.  He and his wife are looking at possibly going to [facility] over Christmas because that is an area of their daughter left a lot.  He said they would probably sprinkle some ashes there.  Holidays are difficult and he is spending Thanksgiving day with his son and daughter-in-law and grandson but they may go to a beach close by for a few days over  Thanksgiving.  He still feels as if he  is grieving his daughter fairly well.  He reports that he feels that he is grieving a little bit a change in his diagnosis.  He recognizes that his memory is getting worse especially in terms of recall.  He thinks that clearly but has a difficult time getting from brain to mouth but eventually does.  Of frustration with how slow that is.  He is more frustrated with the fact that he gets confused more often especially with where he is going.  He recognizes that being fairly new to the area over the last couple years he does not recognize everything with says he is starting to even when he is in a place he knows have to stop and think more about where he is going.  He also feels at times that he is very alone and this medical fight.  He knows that he has family and friends in support and we looked at how others not understanding fully his struggle must feel very lonely to him.  In spite of that he remains positive and is using coping skills and laughter.  He does contract for safety having no thoughts of hurting himself or anyone else. Mental Status Exam: Appearance:  Well Groomed     Behavior: Appropriate  Motor: Pt. Reports some instability due to Lewey Body dementia  Speech/Language:  Normal Rate  Affect: Appropriate  Mood: normal  Thought process: normal  Thought content:   WNL  Sensory/Perceptual disturbances:   WNL  Orientation: oriented to person, place, and situation  Attention: Good  Concentration: Good  Memory: Impaired short term  Fund of knowledge:  Good  Insight:   Good  Judgment:  Good  Impulse Control: Good   Risk Assessment: Danger to Self:  No Self-injurious Behavior: No Danger to Others: No Duty to Warn:no Physical Aggression / Violence:No  Access to Firearms a concern: No  Gang Involvement:No  Treatment plan: Will employ cognitive behavioral therapy as well as grief and loss therapy for relief of anxiety and grief.  Goals of  grief therapy or to have a healthier response to the loss of his daughter and less sadness as evidenced by patient report in therapy notes as well as to help him feel less overwhelmed by her loss.  Goals are to see a 50% reduction in grief symptoms over the next 6 months.  I will encourage the use of the patient telling his grief story about his daughter including encouraging him to bring pictures and memorabilia to help process his feelings including any feelings of guilt associated with her loss.  We will use cognitive behavioral therapy to help identify and change anxiety producing thoughts and behavior patterns so as to improve his ability to better manage anxiety and stress, and manage thoughts and worrisome thinking contributing to anxiety.  We will also refresh and encourage dialectical behavior therapy distress tolerance and mindfulness skills with the intention of reducing anxiety by 50% over the next 6 months. Progress: 30% Of the original target date was July 05, 2023.  I have reviewed the treatment goals and the patient would like to continue with the initial goals with the new target date of January 02, 2024. Diagnosis:PTSD, Bi-Polar D/O  Plan: F/U/ appointments scheduled weekly.  [name], [facility]  [name], [facility]               [name], [facility]               [name], [facility]               [name], [facility]               [name], [facility]               [name], [facility]               [name], [facility]               [name], [facility]               [name], [facility]               [name],  [facility]               [name], [facility]               [name], [facility]               [name], [facility]               [name], [facility]               [name], [facility]               [name], [facility]               [name], [facility]

## 2023-09-25 ENCOUNTER — Ambulatory Visit: Payer: BC Managed Care – PPO | Admitting: Behavioral Health

## 2023-10-02 ENCOUNTER — Ambulatory Visit: Payer: Medicare Other | Admitting: Behavioral Health

## 2023-10-09 ENCOUNTER — Ambulatory Visit (INDEPENDENT_AMBULATORY_CARE_PROVIDER_SITE_OTHER): Payer: Medicare Other | Admitting: Behavioral Health

## 2023-10-09 DIAGNOSIS — F4310 Post-traumatic stress disorder, unspecified: Secondary | ICD-10-CM

## 2023-10-09 DIAGNOSIS — F319 Bipolar disorder, unspecified: Secondary | ICD-10-CM | POA: Diagnosis not present

## 2023-10-09 DIAGNOSIS — F3181 Bipolar II disorder: Secondary | ICD-10-CM

## 2023-10-11 ENCOUNTER — Encounter: Payer: Self-pay | Admitting: Behavioral Health

## 2023-10-11 NOTE — Progress Notes (Addendum)
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 weeks.   Some of them are thoughts that he has dealt with in the past and has not necessarily gotten resolution or answers for but he says they continue to be something  he thinks about and they have affected his quality of life and his sleep recently.  We looked at the root cause behind some of these thoughts and why they are being continued and the anxiety that they are creating.  We looked at ways to try to get these thoughts answered in some way and the best way to present those questions but we also looked at what the patient does with those answers if they are not what he expects or if he does not get a clear answer at all.  We talked about the importance of coping with some of the feelings associated with these thoughts but also how to cognitively reframe or challenge these thoughts if necessary. We will continue to work on coping skills for helping with his anxiety and the progression of his Lewy body dementia diagnosis as well as processing his grief. Mental Status Exam: Appearance:  Well Groomed     Behavior: Appropriate  Motor: Pt. Reports some instability due to Lewey Body dementia  Speech/Language:  Normal Rate  Affect: Appropriate  Mood: normal  Thought process: normal  Thought content:   WNL  Sensory/Perceptual disturbances:   WNL  Orientation: oriented to person, place, and situation  Attention: Good  Concentration: Good  Memory: Impaired short term  Fund of knowledge:  Good  Insight:   Good  Judgment:  Good  Impulse Control: Good   Risk Assessment: Danger to Self:  No Self-injurious Behavior: No Danger to Others: No Duty to Warn:no Physical Aggression / Violence:No  Access to Firearms a concern: No  Gang Involvement:No  Treatment plan: Will employ cognitive behavioral therapy as well as grief and loss therapy for relief of anxiety and grief.  Goals of grief therapy or to have a healthier response to the loss of his daughter and less sadness as evidenced by patient report in  therapy notes as well as to help him feel less overwhelmed by her loss.  Goals are to see a 50% reduction in grief symptoms over the next 6 months.  I will encourage the use of the patient telling his grief story about his daughter including encouraging him to bring pictures and memorabilia to help process his feelings including any feelings of guilt associated with her loss.  We will use cognitive behavioral therapy to help identify and change anxiety producing thoughts and behavior patterns so as to improve his ability to better manage anxiety and stress, and manage thoughts and worrisome thinking contributing to anxiety.  We will also refresh and encourage dialectical behavior therapy distress tolerance and mindfulness skills with the intention of reducing anxiety by 50% over the next 6 months. Progress: 30% Interventions: Cognitive Behavioral Therapy  Diagnosis:PTSD, Bi-Polar D/O  Plan: F/U/ appointments scheduled weekly.  [name], [facility]                                                                                                     [place] [facility] Counselor/Therapist Progress Note  Patient ID: Stephen Myers, MRN: [id number],    Date: 10/11/2023  Time Spent: 60 minutes  spent in person with the patient. Treatment Type: Individual Therapy Reported Symptoms: anxiety, panic attacks The patient continues to struggle with short-term memory.  He is changing some of his providers in the area as he did not get results back after several weeks with minimal response.  He continues to be medication compliant.  He did spend some time with his son, daughter-in-law and grandson and says he is becoming more comfortable with his grandson.  He is also spending some time with his brothers over the next few days which he says is good for his anxiety.  His wife will be retiring next month and so  they will be spending more time together.  We did talk more about his daughter and some of his feelings associated with her death.  He continues to grieve in a healthy way.  Lately he has been looking at some of her art work and recognizing the talent that he already knew she had but appreciates even more so now.  There have also been a couple of former coworkers who have reached out to connect to him which she has appreciated.  I encouraged continued use of his coping skills for anxiety reduction.  He continues to walk daily, fish at a private pond where no one else is when he can especially on Sundays when his wife is with him.  We will continue to work on coping skills for helping with his anxiety and the progression of his Lewy body dementia diagnosis as well as processing his grief. Mental Status Exam: Appearance:  Well Groomed     Behavior: Appropriate  Motor: Pt. Reports some instability due to Lewey Body dementia  Speech/Language:  Normal Rate  Affect: Appropriate  Mood: normal  Thought process: normal  Thought content:   WNL  Sensory/Perceptual disturbances:   WNL  Orientation: oriented to person, place, and situation  Attention: Good  Concentration: Good  Memory: Impaired short term  Fund of knowledge:  Good  Insight:   Good  Judgment:  Good  Impulse Control: Good   Risk Assessment: Danger to Self:  No Self-injurious Behavior: No Danger to Others: No Duty to Warn:no Physical Aggression / Violence:No  Access to Firearms a concern: No  Gang Involvement:No  Treatment plan: Will employ cognitive behavioral therapy as well as grief and loss therapy for relief of anxiety and grief.  Goals of grief therapy or to have a healthier response to the loss of his daughter and less sadness as evidenced by patient report in therapy notes as well as to help him feel less overwhelmed by her loss.  Goals are to see a 50% reduction in grief symptoms over the next 6 months.  I will encourage the  use of the patient telling his grief story about his daughter including encouraging him to bring pictures and memorabilia to help process his feelings including any feelings of guilt associated with her loss.  We will use cognitive behavioral therapy to help identify and change anxiety producing thoughts and behavior patterns so as to improve his ability to better manage anxiety and stress, and manage thoughts and worrisome thinking contributing to anxiety.  We will also refresh and encourage dialectical behavior therapy distress tolerance and mindfulness skills with the intention of reducing anxiety by 50% over the next 6 months. Progress: 30% Interventions: Cognitive Behavioral Therapy  Diagnosis:PTSD, Bi-Polar D/O  Plan: F/U/ appointments scheduled weekly.  [name], [facility]  [place] [facility] Counselor/Therapist Progress Note  Patient ID: Stephen Myers, MRN: [id number],    Date: 10/11/2023  Time Spent: 60 minutes spent in person with the patient. Treatment Type: Individual Therapy Reported Symptoms: anxiety, panic attacks The patient continues to struggle with short-term memory.  He is changing some of his providers in the area as he did not get results back after several weeks with minimal response.  He continues to be medication compliant.  He did spend some time with his son, daughter-in-law and grandson and says he is becoming more comfortable with his grandson.  He is also spending some time with his brothers over the next few days which he says is good for his anxiety.  His wife will be retiring next month and so they will be spending more time together.  We did talk more about his daughter and some of his feelings associated with her death.  He continues to grieve in a healthy  way.  Lately he has been looking at some of her art work and recognizing the talent that he already knew she had but appreciates even more so now.  There have also been a couple of former coworkers who have reached out to connect to him which she has appreciated.  I encouraged continued use of his coping skills for anxiety reduction.  He continues to walk daily, fish at a private pond where no one else is when he can especially on Sundays when his wife is with him.  We will continue to work on coping skills for helping with his anxiety and the progression of his Lewy body dementia diagnosis as well as processing his grief. Mental Status Exam: Appearance:  Well Groomed     Behavior: Appropriate  Motor: Pt. Reports some instability due to Lewey Body dementia  Speech/Language:  Normal Rate  Affect: Appropriate  Mood: normal  Thought process: normal  Thought content:   WNL  Sensory/Perceptual disturbances:   WNL  Orientation: oriented to person, place, and situation  Attention: Good  Concentration: Good  Memory: Impaired short term  Fund of knowledge:  Good  Insight:   Good  Judgment:  Good  Impulse Control: Good   Risk Assessment: Danger to Self:  No Self-injurious Behavior: No Danger to Others: No Duty to Warn:no Physical Aggression / Violence:No  Access to Firearms a concern: No  Gang Involvement:No  Treatment plan: Will employ cognitive behavioral therapy as well as grief and loss therapy for relief of anxiety and grief.  Goals of grief therapy or to have a healthier response to the loss of his daughter and less sadness as evidenced by patient report in therapy notes as well as to help him feel less overwhelmed by her loss.  Goals are to see a 50% reduction in grief symptoms over the next 6 months.  I will encourage the use of the patient telling his grief story about his daughter including encouraging him to bring pictures and memorabilia to help process his feelings including any  feelings of guilt associated with her loss.  We will use cognitive behavioral therapy to help identify and change anxiety producing thoughts and behavior patterns so as to improve his ability to better manage anxiety and stress, and manage thoughts and worrisome thinking contributing to anxiety.  We will also refresh and encourage dialectical behavior therapy distress tolerance and mindfulness skills with the intention of reducing anxiety by 50% over the next 6 months. Progress: 30% Interventions: Cognitive Behavioral Therapy  Diagnosis:PTSD, Bi-Polar  D/O  Plan: F/U/ appointments scheduled weekly.  [name], [facility]                                                                                                                  [name], [facility]  [place] [facility] Counselor/Therapist Progress Note  Patient ID: Stephen Myers, MRN: [id number],    Date: 10/11/2023  Time Spent: 60 minutes spent via video session. The [place]. [place] [facility] and this therapist was in his home office. Treatment Type: Individual Therapy Reported Symptoms: anxiety, panic attacks The patient reports that he has been struggling with some of the same thoughts continually over the past few weeks.  Some of them are thoughts that he has dealt with in the past and has not necessarily gotten resolution or answers for but he says they continue to be something he thinks about and they have affected his quality of life and his sleep recently.  We looked at the root cause behind some of these thoughts and why they are being continued and the anxiety that they are creating.  We looked at ways to try to get these thoughts answered in some way and the best way to present those questions but we also looked at what the patient does with those answers if they are not what he expects or if he does not get a clear answer  at all.  We talked about the importance of coping with some of the feelings associated with these thoughts but also how to cognitively reframe or challenge these thoughts if necessary. We will continue to work on coping skills for helping with his anxiety and the progression of his Lewy body dementia diagnosis as well as processing his grief. Mental Status Exam: Appearance:  Well Groomed     Behavior: Appropriate  Motor: Pt. Reports some instability due to Lewey Body dementia  Speech/Language:  Normal Rate  Affect: Appropriate  Mood: normal  Thought process: normal  Thought content:   WNL  Sensory/Perceptual disturbances:   WNL  Orientation: oriented to person, place, and situation  Attention: Good  Concentration: Good  Memory: Impaired short term  Fund of knowledge:  Good  Insight:   Good  Judgment:  Good  Impulse Control: Good   Risk Assessment: Danger to Self:  No Self-injurious Behavior: No Danger to Others: No Duty to Warn:no Physical Aggression / Violence:No  Access to Firearms a concern: No  Gang Involvement:No  Treatment plan: Will employ cognitive behavioral therapy as well as grief and loss therapy for relief of anxiety and grief.  Goals of grief therapy or to have a healthier response to the loss of his daughter and less sadness as evidenced by patient report in therapy notes as well as to help him feel less overwhelmed by her loss.  Goals are to see a 50% reduction in grief symptoms over the next 6 months.  I will encourage the use of the patient telling his grief story about his daughter including encouraging him  to bring pictures and memorabilia to help process his feelings including any feelings of guilt associated with her loss.  We will use cognitive behavioral therapy to help identify and change anxiety producing thoughts and behavior patterns so as to improve his ability to better manage anxiety and stress, and manage thoughts and worrisome thinking contributing to  anxiety.  We will also refresh and encourage dialectical behavior therapy distress tolerance and mindfulness skills with the intention of reducing anxiety by 50% over the next 6 months. Progress: 30% Interventions: Cognitive Behavioral Therapy  Diagnosis:PTSD, Bi-Polar D/O  Plan: F/U/ appointments scheduled weekly.  [name], [facility]                                                                                                     [place] [facility] Counselor/Therapist Progress Note  Patient ID: Stephen Myers, MRN: [id number],    Date: 10/11/2023  Time Spent: 60 minutes spent in person with the patient. Treatment Type: Individual Therapy Reported Symptoms: anxiety, panic attacks The patient continues to struggle with short-term memory.  He is changing some of his providers in the area as he did not get results back after several weeks with minimal response.  He continues to be medication compliant.  He did spend some time with his son, daughter-in-law and grandson and says he is becoming more comfortable with his grandson.  He is also spending some time with his brothers over the next few days which he says is good for his anxiety.  His wife will be retiring next month and so they will be spending more time together.  We did talk more about his daughter and some of his feelings associated with her death.  He continues to grieve in a healthy way.  Lately he has been looking at some of her art work and recognizing the talent that he already knew she had but appreciates even more so now.  There have also been a couple of former coworkers who have reached out to connect to him which she has appreciated.  I encouraged continued use of his coping skills for anxiety reduction.  He continues to walk daily, fish at a private pond where no one else is when he can especially on Sundays when his  wife is with him.  We will continue to work on coping skills for helping with his anxiety and the progression of his Lewy body dementia diagnosis as well as processing his grief. Mental Status Exam: Appearance:  Well Groomed     Behavior: Appropriate  Motor: Pt. Reports some instability due to Lewey Body dementia  Speech/Language:  Normal Rate  Affect: Appropriate  Mood: normal  Thought process: normal  Thought content:   WNL  Sensory/Perceptual disturbances:   WNL  Orientation: oriented to person, place, and situation  Attention: Good  Concentration: Good  Memory: Impaired short term  Fund of knowledge:  Good  Insight:   Good  Judgment:  Good  Impulse Control: Good   Risk Assessment: Danger to Self:  No Self-injurious Behavior: No Danger to Others: No Duty  to Warn:no Physical Aggression / Violence:No  Access to Firearms a concern: No  Gang Involvement:No  Treatment plan: Will employ cognitive behavioral therapy as well as grief and loss therapy for relief of anxiety and grief.  Goals of grief therapy or to have a healthier response to the loss of his daughter and less sadness as evidenced by patient report in therapy notes as well as to help him feel less overwhelmed by her loss.  Goals are to see a 50% reduction in grief symptoms over the next 6 months.  I will encourage the use of the patient telling his grief story about his daughter including encouraging him to bring pictures and memorabilia to help process his feelings including any feelings of guilt associated with her loss.  We will use cognitive behavioral therapy to help identify and change anxiety producing thoughts and behavior patterns so as to improve his ability to better manage anxiety and stress, and manage thoughts and worrisome thinking contributing to anxiety.  We will also refresh and encourage dialectical behavior therapy distress tolerance and mindfulness skills with the intention of reducing anxiety by 50% over  the next 6 months. Progress: 30% Interventions: Cognitive Behavioral Therapy  Diagnosis:PTSD, Bi-Polar D/O  Plan: F/U/ appointments scheduled weekly.  [name], [facility]                                                                                     [place] [facility] Counselor/Therapist Progress Note  Patient ID: Stephen Myers, MRN: [id number],    Date: 10/11/2023 This session was held via video teletherapy. The patient consented to the video teletherapy and was located in his home during this session. He is aware it is the responsibility of the patient to secure confidentiality on his end of the session. The provider was in a private home office for the duration of this session.    The patient arrived on time for her Caregility session  Time Spent: 57 minutes, 2:00 PM to 2:57 PM  Treatment Type: Individual Therapy Reported Symptoms: anxiety, panic attacks  The patient and his wife had a good time with their son and daughter-in-law and grandson over Thanksgiving.  They are going to go to visit with his brother as well as some other siblings over Christmas.  He did meet with a specialist earlier in the week.  He said there was no significant news or changes that he knew that his doctor would not necessarily try to pinpoint the progression of the Lewy body dementia.  He is starting to see more change in his short-term memory but he still feels that he is handling it as well as he can.  He does report there has been some difficulty with sleep at night because of obsessive thoughts but he for the most part is still able to challenge them.  We did talk a few minutes about what some of those thoughts are and some additional ways to challenge those or distract himself from those. In spite of that he remains positive and is using coping skills and laughter.  He does contract for safety having no  thoughts of hurting himself or anyone else.  Mental Status Exam: Appearance:  Well Groomed     Behavior: Appropriate  Motor: Pt. Reports some instability due to Lewey Body dementia  Speech/Language:  Normal Rate  Affect: Appropriate  Mood: normal  Thought process: normal  Thought content:   WNL  Sensory/Perceptual disturbances:   WNL  Orientation: oriented to person, place, and situation  Attention: Good  Concentration: Good  Memory: Impaired short term  Fund of knowledge:  Good  Insight:   Good  Judgment:  Good  Impulse Control: Good   Risk Assessment: Danger to Self:  No Self-injurious Behavior: No Danger to Others: No Duty to Warn:no Physical Aggression / Violence:No  Access to Firearms a concern: No  Gang Involvement:No  Treatment plan: Will employ cognitive behavioral therapy as well as grief and loss therapy for relief of anxiety and grief.  Goals of grief therapy or to have a healthier response to the loss of his daughter and less sadness as evidenced by patient report in therapy notes as well as to help him feel less overwhelmed by her loss.  Goals are to see a 50% reduction in grief symptoms over the next 6 months.  I will encourage the use of the patient telling his grief story about his daughter including encouraging him to bring pictures and memorabilia to help process his feelings including any feelings of guilt associated with her loss.  We will use cognitive behavioral therapy to help identify and change anxiety producing thoughts and behavior patterns so as to improve his ability to better manage anxiety and stress, and manage thoughts and worrisome thinking contributing to anxiety.  We will also refresh and encourage dialectical behavior therapy distress tolerance and mindfulness skills with the intention of reducing anxiety by 50% over the next 6 months. Progress: 30% Of the original target date was July 05, 2023.  I have reviewed the treatment goals and the patient  would like to continue with the initial goals with the new target date of January 02, 2024. Diagnosis:PTSD, Bi-Polar D/O  Plan: F/U/ appointments scheduled weekly.  [name], [facility]                                                                                                                 [name], [facility]               [name], [facility]               [name], [facility]               [name], [facility]               [name], [facility]               [name], [facility]               [name], [facility]               [name], [facility]  [name], [facility]               [name], [facility]               [name], [facility]               [name], [facility]               [name], [facility]               [name], [facility]               [name], [facility]               [name], [facility]               [name], [facility]               [name], [facility]

## 2023-10-16 ENCOUNTER — Ambulatory Visit: Payer: Medicare Other | Admitting: Behavioral Health

## 2023-10-23 ENCOUNTER — Encounter: Payer: Self-pay | Admitting: Behavioral Health

## 2023-10-23 ENCOUNTER — Ambulatory Visit: Payer: Medicare Other | Admitting: Behavioral Health

## 2023-10-23 DIAGNOSIS — F3181 Bipolar II disorder: Secondary | ICD-10-CM

## 2023-10-23 DIAGNOSIS — F319 Bipolar disorder, unspecified: Secondary | ICD-10-CM

## 2023-10-23 DIAGNOSIS — F4310 Post-traumatic stress disorder, unspecified: Secondary | ICD-10-CM

## 2023-10-23 NOTE — Progress Notes (Signed)
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 being continued and the anxiety that they are creating.  We looked at ways to try to get these thoughts answered in some  way and the best way to present those questions but we also looked at what the patient does with those answers if they are not what he expects or if he does not get a clear answer  at all.  We talked about the importance of coping with some of the feelings associated with these thoughts but also how to cognitively reframe or challenge these thoughts if necessary. We will continue to work on coping skills for helping with his anxiety and the progression of his Lewy body dementia diagnosis as well as processing his grief. Mental Status Exam: Appearance:  Well Groomed     Behavior: Appropriate  Motor: Pt. Reports some instability due to Lewey Body dementia  Speech/Language:  Normal Rate  Affect: Appropriate  Mood: normal  Thought process: normal  Thought content:   WNL  Sensory/Perceptual disturbances:   WNL  Orientation: oriented to person, place, and situation  Attention: Good  Concentration: Good  Memory: Impaired short term  Fund of knowledge:  Good  Insight:   Good  Judgment:  Good  Impulse Control: Good   Risk Assessment: Danger to Self:  No Self-injurious Behavior: No Danger to Others: No Duty to Warn:no Physical Aggression / Violence:No  Access to Firearms a concern: No  Gang Involvement:No  Treatment plan: Will employ cognitive behavioral therapy as well as grief and loss therapy for relief of anxiety and grief.  Goals of grief therapy or to have a healthier response to the loss of his daughter and less sadness as evidenced by patient report in therapy notes as well as to help him feel less overwhelmed by her loss.  Goals are to see a 50% reduction in grief symptoms over the next 6 months.  I will encourage the use of the patient telling his grief story about his daughter including encouraging him to bring pictures and memorabilia to help process his feelings including any feelings of guilt associated with her loss.  We will use cognitive behavioral therapy to help identify and  change anxiety producing thoughts and behavior patterns so as to improve his ability to better manage anxiety and stress, and manage thoughts and worrisome thinking contributing to anxiety.  We will also refresh and encourage dialectical behavior therapy distress tolerance and mindfulness skills with the intention of reducing anxiety by 50% over the next 6 months. Progress: 30% Interventions: Cognitive Behavioral Therapy  Diagnosis:PTSD, Bi-Polar D/O  Plan: F/U/ appointments scheduled weekly.  [name], [facility]                                                                                                     [facility] Counselor/Therapist Progress Note  Patient ID: Stephen Myers, MRN: [id number],    Date: 10/23/2023  Time Spent: 60 minutes spent in person with the patient. Treatment Type: Individual Therapy Reported Symptoms: anxiety, panic attacks The patient continues to struggle with short-term memory.  He is changing some of his providers in the area as he did not get results back after several weeks with minimal response.  He continues to be medication compliant.  He did spend some time with his son, daughter-in-law and grandson and says he is becoming more comfortable with his grandson.  He is also spending some time with his brothers over the next few days  which he says is good for his anxiety.  His wife will be retiring next month and so they will be spending more time together.  We did talk more about his daughter and some of his feelings associated with her death.  He continues to grieve in a healthy way.  Lately he has been looking at some of her art work and recognizing the talent that he already knew she had but appreciates even more so now.  There have also been a couple of former coworkers who have reached out to connect to him which she has appreciated.  I  encouraged continued use of his coping skills for anxiety reduction.  He continues to walk daily, fish at a private pond where no one else is when he can especially on Sundays when his wife is with him.  We will continue to work on coping skills for helping with his anxiety and the progression of his Lewy body dementia diagnosis as well as processing his grief. Mental Status Exam: Appearance:  Well Groomed     Behavior: Appropriate  Motor: Pt. Reports some instability due to Lewey Body dementia  Speech/Language:  Normal Rate  Affect: Appropriate  Mood: normal  Thought process: normal  Thought content:   WNL  Sensory/Perceptual disturbances:   WNL  Orientation: oriented to person, place, and situation  Attention: Good  Concentration: Good  Memory: Impaired short term  Fund of knowledge:  Good  Insight:   Good  Judgment:  Good  Impulse Control: Good   Risk Assessment: Danger to Self:  No Self-injurious Behavior: No Danger to Others: No Duty to Warn:no Physical Aggression / Violence:No  Access to Firearms a concern: No  Gang Involvement:No  Treatment plan: Will employ cognitive behavioral therapy as well as grief and loss therapy for relief of anxiety and grief.  Goals of grief therapy or to have a healthier response to the loss of his daughter and less sadness as evidenced by patient report in therapy notes as well as to help him feel less overwhelmed by her loss.  Goals are to see a 50% reduction in grief symptoms over the next 6 months.  I will encourage the use of the patient telling his grief story about his daughter including encouraging him to bring pictures and memorabilia to help process his feelings including any feelings of guilt associated with her loss.  We will use cognitive behavioral therapy to help identify and change anxiety producing thoughts and behavior patterns so as to improve his ability to better manage anxiety and stress, and manage thoughts and worrisome thinking  contributing to anxiety.  We will also refresh and encourage dialectical behavior therapy distress tolerance and mindfulness skills with the intention of reducing anxiety by 50% over the next 6 months. Progress: 30% Interventions: Cognitive Behavioral Therapy  Diagnosis:PTSD, Bi-Polar D/O  Plan: F/U/ appointments scheduled weekly.  [name], [facility]                                                                                                     [facility]  Counselor/Therapist Progress Note  Patient ID: Stephen Myers, MRN: [id number],    Date: 10/23/2023  Time Spent: 60 minutes spent in person with the patient. Treatment Type: Individual Therapy Reported Symptoms: anxiety, panic attacks The patient continues to struggle with short-term memory.  He is changing some of his providers in the area as he did not get results back after several weeks with minimal response.  He continues to be medication compliant.  He did spend some time with his son, daughter-in-law and grandson and says he is becoming more comfortable with his grandson.  He is also spending some time with his brothers over the next few days which he says is good for his anxiety.  His wife will be retiring next month and so they will be spending more time together.  We did talk more about his daughter and some of his feelings associated with her death.  He continues to grieve in a healthy way.  Lately he has been looking at some of her art work and recognizing the talent that he already knew she had but appreciates even more so now.  There have also been a couple of former coworkers who have reached out to connect to him which she has appreciated.  I encouraged continued use of his coping skills for anxiety reduction.  He continues to walk daily, fish at a private pond where no one else is when he can especially on  Sundays when his wife is with him.  We will continue to work on coping skills for helping with his anxiety and the progression of his Lewy body dementia diagnosis as well as processing his grief. Mental Status Exam: Appearance:  Well Groomed     Behavior: Appropriate  Motor: Pt. Reports some instability due to Lewey Body dementia  Speech/Language:  Normal Rate  Affect: Appropriate  Mood: normal  Thought process: normal  Thought content:   WNL  Sensory/Perceptual disturbances:   WNL  Orientation: oriented to person, place, and situation  Attention: Good  Concentration: Good  Memory: Impaired short term  Fund of knowledge:  Good  Insight:   Good  Judgment:  Good  Impulse Control: Good   Risk Assessment: Danger to Self:  No Self-injurious Behavior: No Danger to Others: No Duty to Warn:no Physical Aggression / Violence:No  Access to Firearms a concern: No  Gang Involvement:No  Treatment plan: Will employ cognitive behavioral therapy as well as grief and loss therapy for relief of anxiety and grief.  Goals of grief therapy or to have a healthier response to the loss of his daughter and less sadness as evidenced by patient report in therapy notes as well as to help him feel less overwhelmed by her loss.  Goals are to see a 50% reduction in grief symptoms over the next 6 months.  I will encourage the use of the patient telling his grief story about his daughter including encouraging him to bring pictures and memorabilia to help process his feelings including any feelings of guilt associated with her loss.  We will use cognitive behavioral therapy to help identify and change anxiety producing thoughts and behavior patterns so as to improve his ability to better manage anxiety and stress, and manage thoughts and worrisome thinking contributing to anxiety.  We will also refresh and encourage dialectical behavior therapy distress tolerance and mindfulness skills with the intention of reducing  anxiety by 50% over the next 6 months. Progress: 30% Interventions: Cognitive Behavioral Therapy  Diagnosis:PTSD, Bi-Polar D/O  Plan:  F/U/ appointments scheduled weekly.  [name], [employer]                                                                                                                  [name], [facility]  [facility] Counselor/Therapist Progress Note  Patient ID: Stephen Myers, MRN: [id number],    Date: 10/23/2023  Time Spent: 60 minutes spent via video session. The [place]. [facility] [place] [facility] and this therapist was in his home office. Treatment Type: Individual Therapy Reported Symptoms: anxiety, panic attacks The patient reports that he has been struggling with some of the same thoughts continually over the past few weeks.  Some of them are thoughts that he has dealt with in the past and has not necessarily gotten resolution or answers for but he says they continue to be something he thinks about and they have affected his quality of life and his sleep recently.  We looked at the root cause behind some of these thoughts and why they are being continued and the anxiety that they are creating.  We looked at ways to try to get these thoughts answered in some way and the best way to present those questions but we also looked at what the patient does with those answers if they are not what he expects or if he does not get a clear answer at all.  We talked about the importance of coping with some of the feelings associated with these thoughts but also how to cognitively reframe or challenge these thoughts if necessary. We will continue to work on coping skills for helping with his anxiety and the progression of his Lewy body dementia diagnosis as well as processing his grief. Mental Status Exam: Appearance:  Well Groomed     Behavior: Appropriate  Motor: Pt.  Reports some instability due to Lewey Body dementia  Speech/Language:  Normal Rate  Affect: Appropriate  Mood: normal  Thought process: normal  Thought content:   WNL  Sensory/Perceptual disturbances:   WNL  Orientation: oriented to person, place, and situation  Attention: Good  Concentration: Good  Memory: Impaired short term  Fund of knowledge:  Good  Insight:   Good  Judgment:  Good  Impulse Control: Good   Risk Assessment: Danger to Self:  No Self-injurious Behavior: No Danger to Others: No Duty to Warn:no Physical Aggression / Violence:No  Access to Firearms a concern: No  Gang Involvement:No  Treatment plan: Will employ cognitive behavioral therapy as well as grief and loss therapy for relief of anxiety and grief.  Goals of grief therapy or to have a healthier response to the loss of his daughter and less sadness as evidenced by patient report in therapy notes as well as to help him feel less overwhelmed by her loss.  Goals are to see a 50% reduction in grief symptoms over the next 6 months.  I will encourage the use of the patient telling his grief story about his daughter including encouraging him to bring pictures  and memorabilia to help process his feelings including any feelings of guilt associated with her loss.  We will use cognitive behavioral therapy to help identify and change anxiety producing thoughts and behavior patterns so as to improve his ability to better manage anxiety and stress, and manage thoughts and worrisome thinking contributing to anxiety.  We will also refresh and encourage dialectical behavior therapy distress tolerance and mindfulness skills with the intention of reducing anxiety by 50% over the next 6 months. Progress: 30% Interventions: Cognitive Behavioral Therapy  Diagnosis:PTSD, Bi-Polar D/O  Plan: F/U/ appointments scheduled weekly.  [name],  [facility]                                                                                                     [facility] Counselor/Therapist Progress Note  Patient ID: Stephen Myers, MRN: [id number],    Date: 10/23/2023  Time Spent: 60 minutes spent in person with the patient. Treatment Type: Individual Therapy Reported Symptoms: anxiety, panic attacks The patient continues to struggle with short-term memory.  He is changing some of his providers in the area as he did not get results back after several weeks with minimal response.  He continues to be medication compliant.  He did spend some time with his son, daughter-in-law and grandson and says he is becoming more comfortable with his grandson.  He is also spending some time with his brothers over the next few days which he says is good for his anxiety.  His wife will be retiring next month and so they will be spending more time together.  We did talk more about his daughter and some of his feelings associated with her death.  He continues to grieve in a healthy way.  Lately he has been looking at some of her art work and recognizing the talent that he already knew she had but appreciates even more so now.  There have also been a couple of former coworkers who have reached out to connect to him which she has appreciated.  I encouraged continued use of his coping skills for anxiety reduction.  He continues to walk daily, fish at a private pond where no one else is when he can especially on Sundays when his wife is with him.  We will continue to work on coping skills for helping with his anxiety and the progression of his Lewy body dementia diagnosis as well as processing his grief. Mental Status Exam: Appearance:  Well Groomed     Behavior: Appropriate  Motor: Pt. Reports some instability due to Lewey Body dementia  Speech/Language:   Normal Rate  Affect: Appropriate  Mood: normal  Thought process: normal  Thought content:   WNL  Sensory/Perceptual disturbances:   WNL  Orientation: oriented to person, place, and situation  Attention: Good  Concentration: Good  Memory: Impaired short term  Fund of knowledge:  Good  Insight:   Good  Judgment:  Good  Impulse Control: Good   Risk Assessment: Danger to Self:  No Self-injurious Behavior: No Danger to Others: No Duty to Warn:no Physical  Aggression / Violence:No  Access to Firearms a concern: No  Gang Involvement:No  Treatment plan: Will employ cognitive behavioral therapy as well as grief and loss therapy for relief of anxiety and grief.  Goals of grief therapy or to have a healthier response to the loss of his daughter and less sadness as evidenced by patient report in therapy notes as well as to help him feel less overwhelmed by her loss.  Goals are to see a 50% reduction in grief symptoms over the next 6 months.  I will encourage the use of the patient telling his grief story about his daughter including encouraging him to bring pictures and memorabilia to help process his feelings including any feelings of guilt associated with her loss.  We will use cognitive behavioral therapy to help identify and change anxiety producing thoughts and behavior patterns so as to improve his ability to better manage anxiety and stress, and manage thoughts and worrisome thinking contributing to anxiety.  We will also refresh and encourage dialectical behavior therapy distress tolerance and mindfulness skills with the intention of reducing anxiety by 50% over the next 6 months. Progress: 30% Interventions: Cognitive Behavioral Therapy  Diagnosis:PTSD, Bi-Polar D/O  Plan: F/U/ appointments scheduled weekly.  [name],  [facility]                                                                                     [facility] Counselor/Therapist Progress Note  Patient ID: Stephen Myers, MRN: [id number],    Date: 10/23/2023 This session was held via video teletherapy. The patient consented to the video teletherapy and was located in his home during this session. He is aware it is the responsibility of the patient to secure confidentiality on his end of the session. The provider was in a private home office for the duration of this session.    The patient arrived on time for her Caregility session  Time Spent: 57 minutes, 2:00 PM to 2:57 PM  Treatment Type: Individual Therapy Reported Symptoms: anxiety, panic attacks  The patient and his wife had a good time with their son and daughter-in-law and grandson over Thanksgiving.  They are going to go to visit with his brother as well as some other siblings over Christmas.  He did meet with a specialist earlier in the week.  He said there was no significant news or changes that he knew that his doctor would not necessarily try to pinpoint the progression of the Lewy body dementia.  He is starting to see more change in his short-term memory but he still feels that he is handling it as well as he can.  He does report there has been some difficulty with sleep at night because of obsessive thoughts but he for the most part is still able to challenge them.  We did talk a few minutes about what some of those thoughts are and some additional ways to challenge those or distract himself from those. In spite of that he remains positive and is using coping skills and laughter.  He does contract for safety having no thoughts of hurting himself or anyone else. Mental Status Exam: Appearance:  Well Groomed     Behavior: Appropriate  Motor: Pt. Reports some instability due to Lewey Body  dementia  Speech/Language:  Normal Rate  Affect: Appropriate  Mood: normal  Thought process: normal  Thought content:   WNL  Sensory/Perceptual disturbances:   WNL  Orientation: oriented to person, place, and situation  Attention: Good  Concentration: Good  Memory: Impaired short term  Fund of knowledge:  Good  Insight:   Good  Judgment:  Good  Impulse Control: Good   Risk Assessment: Danger to Self:  No Self-injurious Behavior: No Danger to Others: No Duty to Warn:no Physical Aggression / Violence:No  Access to Firearms a concern: No  Gang Involvement:No  Treatment plan: Will employ cognitive behavioral therapy as well as grief and loss therapy for relief of anxiety and grief.  Goals of grief therapy or to have a healthier response to the loss of his daughter and less sadness as evidenced by patient report in therapy notes as well as to help him feel less overwhelmed by her loss.  Goals are to see a 50% reduction in grief symptoms over the next 6 months.  I will encourage the use of the patient telling his grief story about his daughter including encouraging him to bring pictures and memorabilia to help process his feelings including any feelings of guilt associated with her loss.  We will use cognitive behavioral therapy to help identify and change anxiety producing thoughts and behavior patterns so as to improve his ability to better manage anxiety and stress, and manage thoughts and worrisome thinking contributing to anxiety.  We will also refresh and encourage dialectical behavior therapy distress tolerance and mindfulness skills with the intention of reducing anxiety by 50% over the next 6 months. Progress: 30% Of the original target date was July 05, 2023.  I have reviewed the treatment goals and the patient would like to continue with the initial goals with the new target date of January 02, 2024. Diagnosis:PTSD, Bi-Polar D/O  Plan: F/U/ appointments scheduled weekly.  [name], [facility]                                                                                                                 [name], [facility]               [name], [facility]               [name], [facility]               [name], [facility]               [name], [facility]               [name], [facility]               [name], [facility]               [name], [facility]  [name], [facility]               [name], [facility]               [name], [facility]               [name], [facility]               [name], [facility]               [name], [facility]               [name], [facility]               [name], [facility]               [name], [facility]               [name], [facility]               [name], [facility]

## 2023-10-30 ENCOUNTER — Ambulatory Visit: Payer: Medicare Other | Admitting: Behavioral Health

## 2023-11-06 ENCOUNTER — Encounter: Payer: Self-pay | Admitting: Behavioral Health

## 2023-11-06 ENCOUNTER — Ambulatory Visit: Payer: Medicare Other | Admitting: Behavioral Health

## 2023-11-06 DIAGNOSIS — F319 Bipolar disorder, unspecified: Secondary | ICD-10-CM | POA: Diagnosis not present

## 2023-11-06 DIAGNOSIS — F4310 Post-traumatic stress disorder, unspecified: Secondary | ICD-10-CM

## 2023-11-06 DIAGNOSIS — F3181 Bipolar II disorder: Secondary | ICD-10-CM

## 2023-11-06 NOTE — Progress Notes (Addendum)
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 his quality of life and his sleep recently.  We looked at the root cause behind some of these thoughts and why they are being continued and the anxiety that they are creating.  We looked at ways to try to get these thoughts answered in some  way and the best way to present those questions but we also looked at what the patient does with those answers if they are not what he expects or if he does not get a clear answer  at all.  We talked about the importance of coping with some of the feelings associated with these thoughts but also how to cognitively reframe or challenge these thoughts if necessary. We will continue to work on coping skills for helping with his anxiety and the progression of his Lewy body dementia diagnosis as well as processing his grief. Mental Status Exam: Appearance:  Well Groomed     Behavior: Appropriate  Motor: Pt. Reports some instability due to Lewey Body dementia  Speech/Language:  Normal Rate  Affect: Appropriate  Mood: normal  Thought process: normal  Thought content:   WNL  Sensory/Perceptual disturbances:   WNL  Orientation: oriented to person, place, and situation  Attention: Good  Concentration: Good  Memory: Impaired short term  Fund of knowledge:  Good  Insight:   Good  Judgment:  Good  Impulse Control: Good   Risk Assessment: Danger to Self:  No Self-injurious Behavior: No Danger to Others: No Duty to Warn:no Physical Aggression / Violence:No  Access to Firearms a concern: No  Gang Involvement:No  Treatment plan: Will employ cognitive behavioral therapy as well as grief and loss therapy for relief of anxiety and grief.  Goals of grief therapy or to have a healthier response to the loss of his daughter and less sadness as evidenced by patient report in therapy notes as well as to help him feel less overwhelmed by her loss.  Goals are to see a 50% reduction in grief symptoms over the next 6 months.  I will encourage the use of the patient telling his grief story about his daughter including encouraging him to bring pictures and memorabilia to help process his feelings including any feelings of guilt associated with her loss.  We will use cognitive behavioral therapy to help identify and  change anxiety producing thoughts and behavior patterns so as to improve his ability to better manage anxiety and stress, and manage thoughts and worrisome thinking contributing to anxiety.  We will also refresh and encourage dialectical behavior therapy distress tolerance and mindfulness skills with the intention of reducing anxiety by 50% over the next 6 months. Progress: 30% Interventions: Cognitive Behavioral Therapy  Diagnosis:PTSD, Bi-Polar D/O  Plan: F/U/ appointments scheduled weekly.  Lorrene CHRISTELLA Hasten, [facility]                                                                                                     [facility] Counselor/Therapist Progress Note  Patient ID: Shelvy Perazzo, MRN: [id number],    Date: 11/06/2023  Time Spent: 60 minutes spent in person with the patient. Treatment Type: Individual Therapy Reported Symptoms: anxiety, panic attacks The patient continues to struggle with short-term memory.  He is changing some of his providers in the area as he did not get results back after several weeks with minimal response.  He continues to be medication compliant.  He did spend some time with his son, daughter-in-law and grandson and says he is becoming more comfortable with his grandson.  He is also spending some time with his brothers over the next few days  which he says is good for his anxiety.  His wife will be retiring next month and so they will be spending more time together.  We did talk more about his daughter and some of his feelings associated with her death.  He continues to grieve in a healthy way.  Lately he has been looking at some of her art work and recognizing the talent that he already knew she had but appreciates even more so now.  There have also been a couple of former coworkers who have reached out to connect to him which she has appreciated.  I  encouraged continued use of his coping skills for anxiety reduction.  He continues to walk daily, fish at a private pond where no one else is when he can especially on Sundays when his wife is with him.  We will continue to work on coping skills for helping with his anxiety and the progression of his Lewy body dementia diagnosis as well as processing his grief. Mental Status Exam: Appearance:  Well Groomed     Behavior: Appropriate  Motor: Pt. Reports some instability due to Lewey Body dementia  Speech/Language:  Normal Rate  Affect: Appropriate  Mood: normal  Thought process: normal  Thought content:   WNL  Sensory/Perceptual disturbances:   WNL  Orientation: oriented to person, place, and situation  Attention: Good  Concentration: Good  Memory: Impaired short term  Fund of knowledge:  Good  Insight:   Good  Judgment:  Good  Impulse Control: Good   Risk Assessment: Danger to Self:  No Self-injurious Behavior: No Danger to Others: No Duty to Warn:no Physical Aggression / Violence:No  Access to Firearms a concern: No  Gang Involvement:No  Treatment plan: Will employ cognitive behavioral therapy as well as grief and loss therapy for relief of anxiety and grief.  Goals of grief therapy or to have a healthier response to the loss of his daughter and less sadness as evidenced by patient report in therapy notes as well as to help him feel less overwhelmed by her loss.  Goals are to see a 50% reduction in grief symptoms over the next 6 months.  I will encourage the use of the patient telling his grief story about his daughter including encouraging him to bring pictures and memorabilia to help process his feelings including any feelings of guilt associated with her loss.  We will use cognitive behavioral therapy to help identify and change anxiety producing thoughts and behavior patterns so as to improve his ability to better manage anxiety and stress, and manage thoughts and worrisome thinking  contributing to anxiety.  We will also refresh and encourage dialectical behavior therapy distress tolerance and mindfulness skills with the intention of reducing anxiety by 50% over the next 6 months. Progress: 30% Interventions: Cognitive Behavioral Therapy  Diagnosis:PTSD, Bi-Polar D/O  Plan: F/U/ appointments scheduled weekly.  Lorrene CHRISTELLA Hasten, [facility]                                                                                                     [facility]  Counselor/Therapist Progress Note  Patient ID: Tajee Savant, MRN: [id number],    Date: 11/06/2023  Time Spent: 60 minutes spent in person with the patient. Treatment Type: Individual Therapy Reported Symptoms: anxiety, panic attacks The patient continues to struggle with short-term memory.  He is changing some of his providers in the area as he did not get results back after several weeks with minimal response.  He continues to be medication compliant.  He did spend some time with his son, daughter-in-law and grandson and says he is becoming more comfortable with his grandson.  He is also spending some time with his brothers over the next few days which he says is good for his anxiety.  His wife will be retiring next month and so they will be spending more time together.  We did talk more about his daughter and some of his feelings associated with her death.  He continues to grieve in a healthy way.  Lately he has been looking at some of her art work and recognizing the talent that he already knew she had but appreciates even more so now.  There have also been a couple of former coworkers who have reached out to connect to him which she has appreciated.  I encouraged continued use of his coping skills for anxiety reduction.  He continues to walk daily, fish at a private pond where no one else is when he can especially on  Sundays when his wife is with him.  We will continue to work on coping skills for helping with his anxiety and the progression of his Lewy body dementia diagnosis as well as processing his grief. Mental Status Exam: Appearance:  Well Groomed     Behavior: Appropriate  Motor: Pt. Reports some instability due to Lewey Body dementia  Speech/Language:  Normal Rate  Affect: Appropriate  Mood: normal  Thought process: normal  Thought content:   WNL  Sensory/Perceptual disturbances:   WNL  Orientation: oriented to person, place, and situation  Attention: Good  Concentration: Good  Memory: Impaired short term  Fund of knowledge:  Good  Insight:   Good  Judgment:  Good  Impulse Control: Good   Risk Assessment: Danger to Self:  No Self-injurious Behavior: No Danger to Others: No Duty to Warn:no Physical Aggression / Violence:No  Access to Firearms a concern: No  Gang Involvement:No  Treatment plan: Will employ cognitive behavioral therapy as well as grief and loss therapy for relief of anxiety and grief.  Goals of grief therapy or to have a healthier response to the loss of his daughter and less sadness as evidenced by patient report in therapy notes as well as to help him feel less overwhelmed by her loss.  Goals are to see a 50% reduction in grief symptoms over the next 6 months.  I will encourage the use of the patient telling his grief story about his daughter including encouraging him to bring pictures and memorabilia to help process his feelings including any feelings of guilt associated with her loss.  We will use cognitive behavioral therapy to help identify and change anxiety producing thoughts and behavior patterns so as to improve his ability to better manage anxiety and stress, and manage thoughts and worrisome thinking contributing to anxiety.  We will also refresh and encourage dialectical behavior therapy distress tolerance and mindfulness skills with the intention of reducing  anxiety by 50% over the next 6 months. Progress: 30% Interventions: Cognitive Behavioral Therapy  Diagnosis:PTSD, Bi-Polar D/O  Plan:  F/U/ appointments scheduled weekly.  Lorrene CHRISTELLA Hasten, [facility]                                                                                                                  Lorrene CHRISTELLA Hasten, [facility]  [facility] Counselor/Therapist Progress Note  Patient ID: Seamus Warehime, MRN: [id number],    Date: 11/06/2023  Time Spent: 60 minutes spent via video session. The pt. [facility] [place] [facility] and this therapist was in his home office. Treatment Type: Individual Therapy Reported Symptoms: anxiety, panic attacks The patient reports that he has been struggling with some of the same thoughts continually over the past few weeks.  Some of them are thoughts that he has dealt with in the past and has not necessarily gotten resolution or answers for but he says they continue to be something he thinks about and they have affected his quality of life and his sleep recently.  We looked at the root cause behind some of these thoughts and why they are being continued and the anxiety that they are creating.  We looked at ways to try to get these thoughts answered in some way and the best way to present those questions but we also looked at what the patient does with those answers if they are not what he expects or if he does not get a clear answer at all.  We talked about the importance of coping with some of the feelings associated with these thoughts but also how to cognitively reframe or challenge these thoughts if necessary. We will continue to work on coping skills for helping with his anxiety and the progression of his Lewy body dementia diagnosis as well as processing his grief. Mental Status Exam: Appearance:  Well Groomed     Behavior: Appropriate  Motor: Pt.  Reports some instability due to Lewey Body dementia  Speech/Language:  Normal Rate  Affect: Appropriate  Mood: normal  Thought process: normal  Thought content:   WNL  Sensory/Perceptual disturbances:   WNL  Orientation: oriented to person, place, and situation  Attention: Good  Concentration: Good  Memory: Impaired short term  Fund of knowledge:  Good  Insight:   Good  Judgment:  Good  Impulse Control: Good   Risk Assessment: Danger to Self:  No Self-injurious Behavior: No Danger to Others: No Duty to Warn:no Physical Aggression / Violence:No  Access to Firearms a concern: No  Gang Involvement:No  Treatment plan: Will employ cognitive behavioral therapy as well as grief and loss therapy for relief of anxiety and grief.  Goals of grief therapy or to have a healthier response to the loss of his daughter and less sadness as evidenced by patient report in therapy notes as well as to help him feel less overwhelmed by her loss.  Goals are to see a 50% reduction in grief symptoms over the next 6 months.  I will encourage the use of the patient telling his grief story about his daughter including encouraging him to bring pictures  and memorabilia to help process his feelings including any feelings of guilt associated with her loss.  We will use cognitive behavioral therapy to help identify and change anxiety producing thoughts and behavior patterns so as to improve his ability to better manage anxiety and stress, and manage thoughts and worrisome thinking contributing to anxiety.  We will also refresh and encourage dialectical behavior therapy distress tolerance and mindfulness skills with the intention of reducing anxiety by 50% over the next 6 months. Progress: 30% Interventions: Cognitive Behavioral Therapy  Diagnosis:PTSD, Bi-Polar D/O  Plan: F/U/ appointments scheduled weekly.  Lorrene CHRISTELLA Hasten,  [facility]                                                                                                      Behavioral Health Counselor/Therapist Progress Note  Patient ID: Dakoda Laventure, MRN: [id number],    Date: 11/06/2023  Time Spent: 60 minutes spent in person with the patient. Treatment Type: Individual Therapy Reported Symptoms: anxiety, panic attacks The patient continues to struggle with short-term memory.  He is changing some of his providers in the area as he did not get results back after several weeks with minimal response.  He continues to be medication compliant.  He did spend some time with his son, daughter-in-law and grandson and says he is becoming more comfortable with his grandson.  He is also spending some time with his brothers over the next few days which he says is good for his anxiety.  His wife will be retiring next month and so they will be spending more time together.  We did talk more about his daughter and some of his feelings associated with her death.  He continues to grieve in a healthy way.  Lately he has been looking at some of her art work and recognizing the talent that he already knew she had but appreciates even more so now.  There have also been a couple of former coworkers who have reached out to connect to him which she has appreciated.  I encouraged continued use of his coping skills for anxiety reduction.  He continues to walk daily, fish at a private pond where no one else is when he can especially on Sundays when his wife is with him.  We will continue to work on coping skills for helping with his anxiety and the progression of his Lewy body dementia diagnosis as well as processing his grief. Mental Status Exam: Appearance:  Well Groomed     Behavior: Appropriate  Motor: Pt. Reports some instability due to Lewey Body dementia  Speech/Language:   Normal Rate  Affect: Appropriate  Mood: normal  Thought process: normal  Thought content:   WNL  Sensory/Perceptual disturbances:   WNL  Orientation: oriented to person, place, and situation  Attention: Good  Concentration: Good  Memory: Impaired short term  Fund of knowledge:  Good  Insight:   Good  Judgment:  Good  Impulse Control: Good   Risk Assessment: Danger to Self:  No Self-injurious Behavior: No Danger to Others: No Duty to Warn:no Physical  Aggression / Violence:No  Access to Firearms a concern: No  Gang Involvement:No  Treatment plan: Will employ cognitive behavioral therapy as well as grief and loss therapy for relief of anxiety and grief.  Goals of grief therapy or to have a healthier response to the loss of his daughter and less sadness as evidenced by patient report in therapy notes as well as to help him feel less overwhelmed by her loss.  Goals are to see a 50% reduction in grief symptoms over the next 6 months.  I will encourage the use of the patient telling his grief story about his daughter including encouraging him to bring pictures and memorabilia to help process his feelings including any feelings of guilt associated with her loss.  We will use cognitive behavioral therapy to help identify and change anxiety producing thoughts and behavior patterns so as to improve his ability to better manage anxiety and stress, and manage thoughts and worrisome thinking contributing to anxiety.  We will also refresh and encourage dialectical behavior therapy distress tolerance and mindfulness skills with the intention of reducing anxiety by 50% over the next 6 months. Progress: 30% Interventions: Cognitive Behavioral Therapy  Diagnosis:PTSD, Bi-Polar D/O  Plan: F/U/ appointments scheduled weekly.  Lorrene CHRISTELLA Hasten,  [facility]                                                                                     [facility] Counselor/Therapist Progress Note  Patient ID: Twan Harkin, MRN: [id number],    Date: 11/06/2023 This session was held via video teletherapy. The patient consented to the video teletherapy and was located in his home during this session. He is aware it is the responsibility of the patient to secure confidentiality on his end of the session. The provider was in a private home office for the duration of this session.    The patient arrived on time for her Caregility session  Time Spent: 57 minutes, 2:00 PM to 2:57 PM  Treatment Type: Individual Therapy Reported Symptoms: anxiety, panic attacks  The patient and his wife spent Christmas with his brother and sister-in-law.  He reports that they had a good time and usually do and that helps his mood.  Christmas was more difficult this year than last in regard to grieving for his daughter and he is not sure why.  They did spend time with his son and their family but that was before Christmas and that could have played into it.  He still feels that he is grieving appropriately and leans on hearing his daughter say to him to focus on the positive which he feels he is doing.  His wife is journaling and he joined her in that 1 day and felt it to be cathartic.  He continues with physical therapy and even though he does not feel like is giving him a lot of physical benefit he said the structure and knowing that he is doing something good for himself is beneficial.  He did meet with Dr. Cresenciano a psychiatrist and told him that he felt like sleeping all the time so they are cutting his trazodone  in half to see if  that might make a difference at night and during the day.  He does acknowledge still having some depressed symptoms that understand that part of the diagnosis  and is working hard to keep his mind and hands busy as much as he can be.  We talked about some things that he can do now that it is winter and he cannot go fishing or chip golf balls as much. He does contract for safety having no thoughts of hurting himself or anyone else. Mental Status Exam: Appearance:  Well Groomed     Behavior: Appropriate  Motor: Pt. Reports some instability due to Lewey Body dementia  Speech/Language:  Normal Rate  Affect: Appropriate  Mood: normal  Thought process: normal  Thought content:   WNL  Sensory/Perceptual disturbances:   WNL  Orientation: oriented to person, place, and situation  Attention: Good  Concentration: Good  Memory: Impaired short term  Fund of knowledge:  Good  Insight:   Good  Judgment:  Good  Impulse Control: Good   Risk Assessment: Danger to Self:  No Self-injurious Behavior: No Danger to Others: No Duty to Warn:no Physical Aggression / Violence:No  Access to Firearms a concern: No  Gang Involvement:No  Treatment plan: Will employ cognitive behavioral therapy as well as grief and loss therapy for relief of anxiety and grief.  Goals of grief therapy or to have a healthier response to the loss of his daughter and less sadness as evidenced by patient report in therapy notes as well as to help him feel less overwhelmed by her loss.  Goals are to see a 50% reduction in grief symptoms over the next 6 months.  I will encourage the use of the patient telling his grief story about his daughter including encouraging him to bring pictures and memorabilia to help process his feelings including any feelings of guilt associated with her loss.  We will use cognitive behavioral therapy to help identify and change anxiety producing thoughts and behavior patterns so as to improve his ability to better manage anxiety and stress, and manage thoughts and worrisome thinking contributing to anxiety.  We will also refresh and encourage dialectical behavior therapy  distress tolerance and mindfulness skills with the intention of reducing anxiety by 50% over the next 6 months. Progress: 30% Of the original target date was July 05, 2023.  I have reviewed the treatment goals and the patient would like to continue with the initial goals with the new target date of January 02, 2024. Diagnosis:PTSD, Bi-Polar D/O  Plan: F/U/ appointments scheduled weekly.  Lorrene CHRISTELLA Hasten, [facility]                                                                                                                 Lorrene CHRISTELLA Hasten, [facility]               Lorrene CHRISTELLA Hasten, [facility]               Lorrene CHRISTELLA Hasten, [facility]  Lorrene CHRISTELLA Hasten, [facility]               Lorrene CHRISTELLA Hasten, [facility]               Lorrene CHRISTELLA Hasten, [facility]               Lorrene CHRISTELLA Hasten, [facility]               Lorrene CHRISTELLA Hasten, [facility]               Lorrene CHRISTELLA Hasten, [facility]               Lorrene CHRISTELLA Hasten, [facility]               Lorrene CHRISTELLA Hasten, [facility]               Lorrene CHRISTELLA Hasten, [facility]               Lorrene CHRISTELLA Hasten, [facility]               Lorrene CHRISTELLA Hasten, [facility]               Lorrene CHRISTELLA Hasten, [facility]               Lorrene CHRISTELLA Hasten, [facility]               Lorrene CHRISTELLA Hasten, [facility]               Lorrene CHRISTELLA Hasten, [facility]               Lorrene CHRISTELLA Hasten, [facility]               Lorrene CHRISTELLA Hasten, [facility]

## 2023-11-13 ENCOUNTER — Ambulatory Visit: Payer: Medicare Other | Admitting: Behavioral Health

## 2023-11-20 ENCOUNTER — Ambulatory Visit (INDEPENDENT_AMBULATORY_CARE_PROVIDER_SITE_OTHER): Payer: Medicare Other | Admitting: Behavioral Health

## 2023-11-20 ENCOUNTER — Encounter: Payer: Self-pay | Admitting: Behavioral Health

## 2023-11-20 DIAGNOSIS — F319 Bipolar disorder, unspecified: Secondary | ICD-10-CM | POA: Diagnosis not present

## 2023-11-20 DIAGNOSIS — F4310 Post-traumatic stress disorder, unspecified: Secondary | ICD-10-CM | POA: Diagnosis not present

## 2023-11-20 DIAGNOSIS — F3181 Bipolar II disorder: Secondary | ICD-10-CM

## 2023-11-20 NOTE — Progress Notes (Signed)
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 of his feelings associated with her death.  He continues to grieve in a healthy way.  Lately he has been looking at some of her art work and recognizing the talent that he already knew she had but appreciates even more so now.  There have also been a couple of former coworkers who have reached out to connect to him which she has appreciated.  I encouraged continued use of his coping skills for anxiety reduction.  He continues to walk daily, fish at a private pond where no one else is when he can especially on Sundays when his wife is with him.  We will continue to work on coping skills for helping with his anxiety and the progression of his Lewy body dementia diagnosis as well as processing his grief. Mental Status Exam: Appearance:  Well Groomed     Behavior: Appropriate  Motor: Pt. Reports some instability due to Lewey Body dementia  Speech/Language:  Normal Rate  Affect: Appropriate  Mood: normal  Thought process: normal  Thought content:   WNL  Sensory/Perceptual disturbances:   WNL  Orientation: oriented to person, place, and situation  Attention: Good  Concentration: Good  Memory: Impaired short term  Fund of knowledge:  Good  Insight:   Good  Judgment:  Good  Impulse Control: Good   Risk Assessment: Danger to Self:  No Self-injurious Behavior: No Danger to Others: No Duty to Warn:no Physical Aggression / Violence:No  Access to Firearms a concern: No  Gang Involvement:No  Treatment plan: Will employ cognitive behavioral therapy as well as grief and loss therapy for relief of anxiety and grief.  Goals of grief therapy or to have a healthier response to the loss of his daughter and less sadness as evidenced by patient report in therapy notes as well as to help him feel less overwhelmed by her loss.   Goals are to see a 50% reduction in grief symptoms over the next 6 months.  I will encourage the use of the patient telling his grief story about his daughter including encouraging him to bring pictures and memorabilia to help process his feelings including any feelings of guilt associated with her loss.  We will use cognitive behavioral therapy to help identify and change anxiety producing thoughts and behavior patterns so as to improve his ability to better manage anxiety and stress, and manage thoughts and worrisome thinking contributing to anxiety.  We will also refresh and encourage dialectical behavior therapy distress tolerance and mindfulness skills with the intention of reducing anxiety by 50% over the next 6 months. Progress: 30% Interventions: Cognitive Behavioral Therapy  Diagnosis:PTSD, Bi-Polar D/O  Plan: F/U/ appointments scheduled weekly.  [name], [facility]  [facility] Counselor/Therapist Progress Note  Patient ID: Stephen Myers, MRN: [id number],    Date: 11/20/2023  Time Spent: 60 minutes spent in person with the patient. Treatment Type: Individual Therapy Reported Symptoms: anxiety, panic attacks The patient continues to struggle with short-term memory.  He is changing some of his providers in the area as he did not get results back after several weeks with minimal response.  He continues to be medication compliant.  He did spend some time with his son, daughter-in-law and grandson and says he is becoming more comfortable with his grandson.  He is also spending some time with his brothers over the next few days which he says is good for his anxiety.  His wife will be retiring next month and so they will be spending more time together.  We did talk more about his  daughter and some of his feelings associated with her death.  He continues to grieve in a healthy way.  Lately he has been looking at some of her art work and recognizing the talent that he already knew she had but appreciates even more so now.  There have also been a couple of former coworkers who have reached out to connect to him which she has appreciated.  I encouraged continued use of his coping skills for anxiety reduction.  He continues to walk daily, fish at a private pond where no one else is when he can especially on Sundays when his wife is with him.  We will continue to work on coping skills for helping with his anxiety and the progression of his Lewy body dementia diagnosis as well as processing his grief. Mental Status Exam: Appearance:  Well Groomed     Behavior: Appropriate  Motor: Pt. Reports some instability due to Lewey Body dementia  Speech/Language:  Normal Rate  Affect: Appropriate  Mood: normal  Thought process: normal  Thought content:   WNL  Sensory/Perceptual disturbances:   WNL  Orientation: oriented to person, place, and situation  Attention: Good  Concentration: Good  Memory: Impaired short term  Fund of knowledge:  Good  Insight:   Good  Judgment:  Good  Impulse Control: Good   Risk Assessment: Danger to Self:  No Self-injurious Behavior: No Danger to Others: No Duty to Warn:no Physical Aggression / Violence:No  Access to Firearms a concern: No  Gang Involvement:No  Treatment plan: Will employ cognitive behavioral therapy as well as grief and loss therapy for relief of anxiety and grief.  Goals of grief therapy or to have a healthier response to the loss of his daughter and less sadness as evidenced by patient report in therapy notes as well as to help him feel less overwhelmed by her loss.  Goals are to see a 50% reduction in grief symptoms over the next 6 months.  I will encourage the use of the patient telling his grief story about his daughter including  encouraging him to bring pictures and memorabilia to help process his feelings including any feelings of guilt associated with her loss.  We will use cognitive behavioral therapy to help identify and change anxiety producing thoughts and behavior patterns so as to improve his ability to better manage anxiety and stress, and manage thoughts and worrisome thinking contributing to anxiety.  We will also refresh and encourage dialectical behavior therapy distress tolerance and mindfulness skills with the intention of reducing anxiety by 50% over the next 6 months. Progress: 30% Interventions: Cognitive Behavioral Therapy  Diagnosis:PTSD, Bi-Polar  D/O  Plan: F/U/ appointments scheduled weekly.  [name], [facility]                                                                                                                  [name], [facility]  [facility] Counselor/Therapist Progress Note  Patient ID: Stephen Myers, MRN: [id number],    Date: 11/20/2023  Time Spent: 60 minutes spent via video session. The pt. Was [place] [facility] and this therapist was in his home office. Treatment Type: Individual Therapy Reported Symptoms: anxiety, panic attacks The patient reports that he has been struggling with some of the same thoughts continually over the past few weeks.  Some of them are thoughts that he has dealt with in the past and has not necessarily gotten resolution or answers for but he says they continue to be something he thinks about and they have affected his quality of life and his sleep recently.  We looked at the root cause behind some of these thoughts and why they are being continued and the anxiety that they are creating.  We looked at ways to try to get these thoughts answered in some way and the best way to present those questions but we also looked at what the patient  does with those answers if they are not what he expects or if he does not get a clear answer at all.  We talked about the importance of coping with some of the feelings associated with these thoughts but also how to cognitively reframe or challenge these thoughts if necessary. We will continue to work on coping skills for helping with his anxiety and the progression of his Lewy body dementia diagnosis as well as processing his grief. Mental Status Exam: Appearance:  Well Groomed     Behavior: Appropriate  Motor: Pt. Reports some instability due to Lewey Body dementia  Speech/Language:  Normal Rate  Affect: Appropriate  Mood: normal  Thought process: normal  Thought content:   WNL  Sensory/Perceptual disturbances:   WNL  Orientation: oriented to person, place, and situation  Attention: Good  Concentration: Good  Memory: Impaired short term  Fund of knowledge:  Good  Insight:   Good  Judgment:  Good  Impulse Control: Good   Risk Assessment: Danger to Self:  No Self-injurious Behavior: No Danger to Others: No Duty to Warn:no Physical Aggression / Violence:No  Access to Firearms a concern: No  Gang Involvement:No  Treatment plan: Will employ cognitive behavioral therapy as well as grief and loss therapy for relief of anxiety and grief.  Goals of grief therapy or to have a healthier response to the loss of his daughter and less sadness as evidenced by patient report in therapy notes as well as to help him feel less overwhelmed by her loss.  Goals are to see a 50% reduction in grief symptoms over the next 6 months.  I will encourage the use of the patient telling his grief story about his daughter including encouraging him  to bring pictures and memorabilia to help process his feelings including any feelings of guilt associated with her loss.  We will use cognitive behavioral therapy to help identify and change anxiety producing thoughts and behavior patterns so as to improve his ability to  better manage anxiety and stress, and manage thoughts and worrisome thinking contributing to anxiety.  We will also refresh and encourage dialectical behavior therapy distress tolerance and mindfulness skills with the intention of reducing anxiety by 50% over the next 6 months. Progress: 30% Interventions: Cognitive Behavioral Therapy  Diagnosis:PTSD, Bi-Polar D/O  Plan: F/U/ appointments scheduled weekly.  [name], [facility]                                                                                                     [place] [facility] Counselor/Therapist Progress Note  Patient ID: Stephen Myers, MRN: [id number],    Date: 11/20/2023  Time Spent: 60 minutes spent in person with the patient. Treatment Type: Individual Therapy Reported Symptoms: anxiety, panic attacks The patient continues to struggle with short-term memory.  He is changing some of his providers in the area as he did not get results back after several weeks with minimal response.  He continues to be medication compliant.  He did spend some time with his son, daughter-in-law and grandson and says he is becoming more comfortable with his grandson.  He is also spending some time with his brothers over the next few days which he says is good for his anxiety.  His wife will be retiring next month and so they will be spending more time together.  We did talk more about his daughter and some of his feelings associated with her death.  He continues to grieve in a healthy way.  Lately he has been looking at some of her art work and recognizing the talent that he already knew she had but appreciates even more so now.  There have also been a couple of former coworkers who have reached out to connect to him which she has appreciated.  I encouraged continued use of his coping skills for anxiety reduction.  He continues to walk  daily, fish at a private pond where no one else is when he can especially on Sundays when his wife is with him.  We will continue to work on coping skills for helping with his anxiety and the progression of his Lewy body dementia diagnosis as well as processing his grief. Mental Status Exam: Appearance:  Well Groomed     Behavior: Appropriate  Motor: Pt. Reports some instability due to Lewey Body dementia  Speech/Language:  Normal Rate  Affect: Appropriate  Mood: normal  Thought process: normal  Thought content:   WNL  Sensory/Perceptual disturbances:   WNL  Orientation: oriented to person, place, and situation  Attention: Good  Concentration: Good  Memory: Impaired short term  Fund of knowledge:  Good  Insight:   Good  Judgment:  Good  Impulse Control: Good   Risk Assessment: Danger to Self:  No Self-injurious Behavior: No Danger to Others: No Duty  to Warn:no Physical Aggression / Violence:No  Access to Firearms a concern: No  Gang Involvement:No  Treatment plan: Will employ cognitive behavioral therapy as well as grief and loss therapy for relief of anxiety and grief.  Goals of grief therapy or to have a healthier response to the loss of his daughter and less sadness as evidenced by patient report in therapy notes as well as to help him feel less overwhelmed by her loss.  Goals are to see a 50% reduction in grief symptoms over the next 6 months.  I will encourage the use of the patient telling his grief story about his daughter including encouraging him to bring pictures and memorabilia to help process his feelings including any feelings of guilt associated with her loss.  We will use cognitive behavioral therapy to help identify and change anxiety producing thoughts and behavior patterns so as to improve his ability to better manage anxiety and stress, and manage thoughts and worrisome thinking contributing to anxiety.  We will also refresh and encourage dialectical behavior therapy  distress tolerance and mindfulness skills with the intention of reducing anxiety by 50% over the next 6 months. Progress: 30% Interventions: Cognitive Behavioral Therapy  Diagnosis:PTSD, Bi-Polar D/O  Plan: F/U/ appointments scheduled weekly.  [name], [facility]                                                                                     [place] [facility] Counselor/Therapist Progress Note  Patient ID: Stephen Myers, MRN: [id number],    Date: 11/20/2023 This session was held via video teletherapy. The patient consented to the video teletherapy and was located in his home during this session. He is aware it is the responsibility of the patient to secure confidentiality on his end of the session. The provider was in a private home office for the duration of this session.    The patient arrived on time for her Caregility session  Time Spent: 57 minutes, 2:00 PM to 2:57 PM  Treatment Type: Individual Therapy Reported Symptoms: anxiety, panic attacks  The patient and his wife spent Christmas with his brother and sister-in-law.  He reports that they had a good time and usually do and that helps his mood.  Christmas was more difficult this year than last in regard to grieving for his daughter and he is not sure why.  They did spend time with his son and their family but that was before Christmas and that could have played into it.  He still feels that he is grieving appropriately and leans on hearing his daughter say to him to focus on the positive which he feels he is doing.  His wife is journaling and he joined her in that 1 day and felt it to be cathartic.  He continues with physical therapy and even though he does not feel like is giving him a lot of physical benefit he said the structure and knowing that he is doing something good for himself is beneficial.  He did meet with Dr. Jamse Arn a  psychiatrist and told him that he felt like sleeping all the time so they are cutting his trazodone in  half to see if that might make a difference at night and during the day.  He does acknowledge still having some depressed symptoms that understand that part of the diagnosis and is working hard to keep his mind and hands busy as much as he can be.  We talked about some things that he can do now that it is winter and he cannot go fishing or chip golf balls as much. He does contract for safety having no thoughts of hurting himself or anyone else. Mental Status Exam: Appearance:  Well Groomed     Behavior: Appropriate  Motor: Pt. Reports some instability due to Lewey Body dementia  Speech/Language:  Normal Rate  Affect: Appropriate  Mood: normal  Thought process: normal  Thought content:   WNL  Sensory/Perceptual disturbances:   WNL  Orientation: oriented to person, place, and situation  Attention: Good  Concentration: Good  Memory: Impaired short term  Fund of knowledge:  Good  Insight:   Good  Judgment:  Good  Impulse Control: Good   Risk Assessment: Danger to Self:  No Self-injurious Behavior: No Danger to Others: No Duty to Warn:no Physical Aggression / Violence:No  Access to Firearms a concern: No  Gang Involvement:No  Treatment plan: Will employ cognitive behavioral therapy as well as grief and loss therapy for relief of anxiety and grief.  Goals of grief therapy or to have a healthier response to the loss of his daughter and less sadness as evidenced by patient report in therapy notes as well as to help him feel less overwhelmed by her loss.  Goals are to see a 50% reduction in grief symptoms over the next 6 months.  I will encourage the use of the patient telling his grief story about his daughter including encouraging him to bring pictures and memorabilia to help process his feelings including any feelings of guilt associated with her loss.  We will use cognitive behavioral  therapy to help identify and change anxiety producing thoughts and behavior patterns so as to improve his ability to better manage anxiety and stress, and manage thoughts and worrisome thinking contributing to anxiety.  We will also refresh and encourage dialectical behavior therapy distress tolerance and mindfulness skills with the intention of reducing anxiety by 50% over the next 6 months. Progress: 30% Of the original target date was July 05, 2023.  I have reviewed the treatment goals and the patient would like to continue with the initial goals with the new target date of January 02, 2024. Diagnosis:PTSD, Bi-Polar D/O  Plan: F/U/ appointments scheduled weekly.  [name], [facility]                                                                                                                 [name], [facility]               [name], [facility]               [name], [facility]  [name], [facility]               [name], [facility]               [name], [facility]               [name], [facility]               [name], [facility]               [name], [facility]               [name], [facility]               [name], [facility]               [name], [facility]               [name], [facility]               [name], [facility]               [name], [facility]               [name], [facility]               [name], [facility]               [name], [facility]               [name], [facility]               [name], [facility]

## 2023-11-27 ENCOUNTER — Encounter: Payer: Self-pay | Admitting: Behavioral Health

## 2023-11-27 ENCOUNTER — Ambulatory Visit (INDEPENDENT_AMBULATORY_CARE_PROVIDER_SITE_OTHER): Payer: Medicare Other | Admitting: Behavioral Health

## 2023-11-27 DIAGNOSIS — F4310 Post-traumatic stress disorder, unspecified: Secondary | ICD-10-CM

## 2023-11-27 DIAGNOSIS — F3181 Bipolar II disorder: Secondary | ICD-10-CM | POA: Diagnosis not present

## 2023-11-27 NOTE — Progress Notes (Signed)
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 feelings associated with her death.  He continues to grieve in a healthy way.  Lately he has been looking at some of her art work and recognizing the talent that he already knew she had but appreciates even more so now.  There have also been a couple of former coworkers who have reached out to connect to him which she has appreciated.  I encouraged continued use of his coping skills for anxiety reduction.  He continues to walk daily, fish at a private pond where no one else is when he can especially on Sundays when his wife is with him.  We will continue to work on coping skills for helping with his anxiety and the progression of his Lewy body dementia diagnosis as well as processing his grief. Mental Status Exam: Appearance:  Well Groomed     Behavior: Appropriate  Motor: Pt. Reports some instability due to Lewey Body dementia  Speech/Language:  Normal Rate  Affect: Appropriate  Mood: normal  Thought process: normal  Thought content:   WNL  Sensory/Perceptual disturbances:   WNL  Orientation: oriented to person, place, and situation  Attention: Good  Concentration: Good  Memory: Impaired short term  Fund of knowledge:  Good  Insight:   Good  Judgment:  Good  Impulse Control: Good   Risk Assessment: Danger to Self:  No Self-injurious Behavior: No Danger to Others: No Duty to Warn:no Physical Aggression / Violence:No  Access to Firearms a concern: No  Gang Involvement:No  Treatment plan: Will employ cognitive behavioral therapy as well as grief and loss therapy for relief of anxiety and grief.  Goals of grief therapy or to have a healthier response to the loss of his daughter and less sadness as evidenced by patient report in therapy notes as well as to help him feel less overwhelmed by her loss.   Goals are to see a 50% reduction in grief symptoms over the next 6 months.  I will encourage the use of the patient telling his grief story about his daughter including encouraging him to bring pictures and memorabilia to help process his feelings including any feelings of guilt associated with her loss.  We will use cognitive behavioral therapy to help identify and change anxiety producing thoughts and behavior patterns so as to improve his ability to better manage anxiety and stress, and manage thoughts and worrisome thinking contributing to anxiety.  We will also refresh and encourage dialectical behavior therapy distress tolerance and mindfulness skills with the intention of reducing anxiety by 50% over the next 6 months. Progress: 30% Interventions: Cognitive Behavioral Therapy  Diagnosis:PTSD, Bi-Polar D/O  Plan: F/U/ appointments scheduled weekly.  [name], [facility]  [place] [facility] Counselor/Therapist Progress Note  Patient ID: Stephen Myers, MRN: [id number],    Date: 11/27/2023  Time Spent: 60 minutes spent in person with the patient. Treatment Type: Individual Therapy Reported Symptoms: anxiety, panic attacks The patient continues to struggle with short-term memory.  He is changing some of his providers in the area as he did not get results back after several weeks with minimal response.  He continues to be medication compliant.  He did spend some time with his son, daughter-in-law and grandson and says he is becoming more comfortable with his grandson.  He is also spending some time with his brothers over the next few days which he says is good for his anxiety.  His wife will be retiring next month and so they will be spending more time together.  We did talk more about his  daughter and some of his feelings associated with her death.  He continues to grieve in a healthy way.  Lately he has been looking at some of her art work and recognizing the talent that he already knew she had but appreciates even more so now.  There have also been a couple of former coworkers who have reached out to connect to him which she has appreciated.  I encouraged continued use of his coping skills for anxiety reduction.  He continues to walk daily, fish at a private pond where no one else is when he can especially on Sundays when his wife is with him.  We will continue to work on coping skills for helping with his anxiety and the progression of his Lewy body dementia diagnosis as well as processing his grief. Mental Status Exam: Appearance:  Well Groomed     Behavior: Appropriate  Motor: Pt. Reports some instability due to Lewey Body dementia  Speech/Language:  Normal Rate  Affect: Appropriate  Mood: normal  Thought process: normal  Thought content:   WNL  Sensory/Perceptual disturbances:   WNL  Orientation: oriented to person, place, and situation  Attention: Good  Concentration: Good  Memory: Impaired short term  Fund of knowledge:  Good  Insight:   Good  Judgment:  Good  Impulse Control: Good   Risk Assessment: Danger to Self:  No Self-injurious Behavior: No Danger to Others: No Duty to Warn:no Physical Aggression / Violence:No  Access to Firearms a concern: No  Gang Involvement:No  Treatment plan: Will employ cognitive behavioral therapy as well as grief and loss therapy for relief of anxiety and grief.  Goals of grief therapy or to have a healthier response to the loss of his daughter and less sadness as evidenced by patient report in therapy notes as well as to help him feel less overwhelmed by her loss.  Goals are to see a 50% reduction in grief symptoms over the next 6 months.  I will encourage the use of the patient telling his grief story about his daughter including  encouraging him to bring pictures and memorabilia to help process his feelings including any feelings of guilt associated with her loss.  We will use cognitive behavioral therapy to help identify and change anxiety producing thoughts and behavior patterns so as to improve his ability to better manage anxiety and stress, and manage thoughts and worrisome thinking contributing to anxiety.  We will also refresh and encourage dialectical behavior therapy distress tolerance and mindfulness skills with the intention of reducing anxiety by 50% over the next 6 months. Progress: 30% Interventions: Cognitive Behavioral Therapy  Diagnosis:PTSD, Bi-Polar  D/O  Plan: F/U/ appointments scheduled weekly.  [name], [facility]                                                                                                                  [name], [facility]  [place] [facility] Counselor/Therapist Progress Note  Patient ID: Stephen Myers, MRN: [id number],    Date: 11/27/2023  Time Spent: 60 minutes spent via video session. The pt. Was [place] [facility] and this therapist was in his home office. Treatment Type: Individual Therapy Reported Symptoms: anxiety, panic attacks The patient reports that he has been struggling with some of the same thoughts continually over the past few weeks.  Some of them are thoughts that he has dealt with in the past and has not necessarily gotten resolution or answers for but he says they continue to be something he thinks about and they have affected his quality of life and his sleep recently.  We looked at the root cause behind some of these thoughts and why they are being continued and the anxiety that they are creating.  We looked at ways to try to get these thoughts answered in some way and the best way to present those questions but we also looked at what the patient  does with those answers if they are not what he expects or if he does not get a clear answer at all.  We talked about the importance of coping with some of the feelings associated with these thoughts but also how to cognitively reframe or challenge these thoughts if necessary. We will continue to work on coping skills for helping with his anxiety and the progression of his Lewy body dementia diagnosis as well as processing his grief. Mental Status Exam: Appearance:  Well Groomed     Behavior: Appropriate  Motor: Pt. Reports some instability due to Lewey Body dementia  Speech/Language:  Normal Rate  Affect: Appropriate  Mood: normal  Thought process: normal  Thought content:   WNL  Sensory/Perceptual disturbances:   WNL  Orientation: oriented to person, place, and situation  Attention: Good  Concentration: Good  Memory: Impaired short term  Fund of knowledge:  Good  Insight:   Good  Judgment:  Good  Impulse Control: Good   Risk Assessment: Danger to Self:  No Self-injurious Behavior: No Danger to Others: No Duty to Warn:no Physical Aggression / Violence:No  Access to Firearms a concern: No  Gang Involvement:No  Treatment plan: Will employ cognitive behavioral therapy as well as grief and loss therapy for relief of anxiety and grief.  Goals of grief therapy or to have a healthier response to the loss of his daughter and less sadness as evidenced by patient report in therapy notes as well as to help him feel less overwhelmed by her loss.  Goals are to see a 50% reduction in grief symptoms over the next 6 months.  I will encourage the use of the patient telling his grief story about his daughter including encouraging him  to bring pictures and memorabilia to help process his feelings including any feelings of guilt associated with her loss.  We will use cognitive behavioral therapy to help identify and change anxiety producing thoughts and behavior patterns so as to improve his ability to  better manage anxiety and stress, and manage thoughts and worrisome thinking contributing to anxiety.  We will also refresh and encourage dialectical behavior therapy distress tolerance and mindfulness skills with the intention of reducing anxiety by 50% over the next 6 months. Progress: 30% Interventions: Cognitive Behavioral Therapy  Diagnosis:PTSD, Bi-Polar D/O  Plan: F/U/ appointments scheduled weekly.  [name], [facility]                                                                                                     [place] [facility] Counselor/Therapist Progress Note  Patient ID: Stephen Myers, MRN: [id number],    Date: 11/27/2023  Time Spent: 60 minutes spent in person with the patient. Treatment Type: Individual Therapy Reported Symptoms: anxiety, panic attacks The patient continues to struggle with short-term memory.  He is changing some of his providers in the area as he did not get results back after several weeks with minimal response.  He continues to be medication compliant.  He did spend some time with his son, daughter-in-law and grandson and says he is becoming more comfortable with his grandson.  He is also spending some time with his brothers over the next few days which he says is good for his anxiety.  His wife will be retiring next month and so they will be spending more time together.  We did talk more about his daughter and some of his feelings associated with her death.  He continues to grieve in a healthy way.  Lately he has been looking at some of her art work and recognizing the talent that he already knew she had but appreciates even more so now.  There have also been a couple of former coworkers who have reached out to connect to him which she has appreciated.  I encouraged continued use of his coping skills for anxiety reduction.  He continues to walk  daily, fish at a private pond where no one else is when he can especially on Sundays when his wife is with him.  We will continue to work on coping skills for helping with his anxiety and the progression of his Lewy body dementia diagnosis as well as processing his grief. Mental Status Exam: Appearance:  Well Groomed     Behavior: Appropriate  Motor: Pt. Reports some instability due to Lewey Body dementia  Speech/Language:  Normal Rate  Affect: Appropriate  Mood: normal  Thought process: normal  Thought content:   WNL  Sensory/Perceptual disturbances:   WNL  Orientation: oriented to person, place, and situation  Attention: Good  Concentration: Good  Memory: Impaired short term  Fund of knowledge:  Good  Insight:   Good  Judgment:  Good  Impulse Control: Good   Risk Assessment: Danger to Self:  No Self-injurious Behavior: No Danger to Others: No Duty  to Warn:no Physical Aggression / Violence:No  Access to Firearms a concern: No  Gang Involvement:No  Treatment plan: Will employ cognitive behavioral therapy as well as grief and loss therapy for relief of anxiety and grief.  Goals of grief therapy or to have a healthier response to the loss of his daughter and less sadness as evidenced by patient report in therapy notes as well as to help him feel less overwhelmed by her loss.  Goals are to see a 50% reduction in grief symptoms over the next 6 months.  I will encourage the use of the patient telling his grief story about his daughter including encouraging him to bring pictures and memorabilia to help process his feelings including any feelings of guilt associated with her loss.  We will use cognitive behavioral therapy to help identify and change anxiety producing thoughts and behavior patterns so as to improve his ability to better manage anxiety and stress, and manage thoughts and worrisome thinking contributing to anxiety.  We will also refresh and encourage dialectical behavior therapy  distress tolerance and mindfulness skills with the intention of reducing anxiety by 50% over the next 6 months. Progress: 30% Interventions: Cognitive Behavioral Therapy  Diagnosis:PTSD, Bi-Polar D/O  Plan: F/U/ appointments scheduled weekly.  [name], [facility]                                                                                     [place] [facility] Counselor/Therapist Progress Note  Patient ID: Stephen Myers, MRN: [id number],    Date: 11/27/2023 This session was held via video teletherapy. The patient consented to the video teletherapy and was located in his home during this session. He is aware it is the responsibility of the patient to secure confidentiality on his end of the session. The provider was in a private home office for the duration of this session.    The patient arrived on time for her Caregility session  Time Spent: 57 minutes, 2:00 PM to 2:57 PM  Treatment Type: Individual Therapy Reported Symptoms: anxiety, panic attacks The patient reported that a rare snow event limited him from getting outside and walking is 1 thing that helps keep his anxiety down.  He was not able to walk because of the cold and snow for 2 or 3 days but was finally able to this morning to get out a little bit with his wife and that helps reduce his anxiety.  He also is writing letters to his daughter and journaling.  He also started doing crossword puzzles and word searches and scrambled's to help strengthen his short-term memory.  He says he can remember things very well but is hopeful that doing these type things will at least improve his short-term memory somewhat.  I encouraged continued use of anxiety reduction techniques and including consistent exercise and time with his family.   He does contract for safety having no thoughts of hurting himself or anyone else. Mental Status  Exam: Appearance:  Well Groomed     Behavior: Appropriate  Motor: Pt. Reports some instability due to Lewey Body dementia  Speech/Language:  Normal Rate  Affect: Appropriate  Mood: normal  Thought process: normal  Thought content:   WNL  Sensory/Perceptual disturbances:   WNL  Orientation: oriented to person, place, and situation  Attention: Good  Concentration: Good  Memory: Impaired short term  Fund of knowledge:  Good  Insight:   Good  Judgment:  Good  Impulse Control: Good   Risk Assessment: Danger to Self:  No Self-injurious Behavior: No Danger to Others: No Duty to Warn:no Physical Aggression / Violence:No  Access to Firearms a concern: No  Gang Involvement:No  Treatment plan: Will employ cognitive behavioral therapy as well as grief and loss therapy for relief of anxiety and grief.  Goals of grief therapy or to have a healthier response to the loss of his daughter and less sadness as evidenced by patient report in therapy notes as well as to help him feel less overwhelmed by her loss.  Goals are to see a 50% reduction in grief symptoms over the next 6 months.  I will encourage the use of the patient telling his grief story about his daughter including encouraging him to bring pictures and memorabilia to help process his feelings including any feelings of guilt associated with her loss.  We will use cognitive behavioral therapy to help identify and change anxiety producing thoughts and behavior patterns so as to improve his ability to better manage anxiety and stress, and manage thoughts and worrisome thinking contributing to anxiety.  We will also refresh and encourage dialectical behavior therapy distress tolerance and mindfulness skills with the intention of reducing anxiety by 50% over the next 6 months. Progress: 30% Of the original target date was July 05, 2023.  I have reviewed the treatment goals and the patient would like to continue with the initial goals with the new  target date of January 02, 2024. Diagnosis:PTSD, Bi-Polar D/O  Plan: F/U/ appointments scheduled weekly.  [name], [facility]                                                                                                                 [name], [facility]               [name], [facility]               [name], [facility]               [name], [facility]               [name], [facility]               [name], [facility]               [name], [facility]               [name], [facility]               [name], [facility]               [name], [facility]  [name], [facility]               [name], [facility]               [name], [facility]               [name], [facility]               [name], [facility]               [name], [facility]               [name], [facility]               [name], [facility]               [name], [facility]               [name], [facility]               [name], [facility]

## 2023-12-04 ENCOUNTER — Ambulatory Visit: Payer: Medicare Other | Admitting: Behavioral Health

## 2023-12-04 ENCOUNTER — Encounter: Payer: Self-pay | Admitting: Behavioral Health

## 2023-12-04 DIAGNOSIS — F319 Bipolar disorder, unspecified: Secondary | ICD-10-CM

## 2023-12-04 DIAGNOSIS — F4310 Post-traumatic stress disorder, unspecified: Secondary | ICD-10-CM

## 2023-12-04 NOTE — Progress Notes (Signed)
[facility] Counselor/Therapist Progress Note  Patient ID: Stephen Myers, MRN: [id number],    Date:11/20/23  Time Spent: 58 minutes 2:01 PM to 2:59 PM This session was held via video teletherapy. The patient consented to the video teletherapy and was located in his home during this session. He is aware it is the responsibility of the patient to secure confidentiality on his end of the session. The provider was in a private home office for the duration of this session.     Treatment Type: Individual Therapy Reported Symptoms: anxiety, panic attacks  The patient's wife had posted on social media some things about him losing her daughter but also about where the patient is in terms of his diagnosis.  She also posted a picture of the patient when he was much younger and he said the response was amazing.  He had gotten multiple comments from people that he had known since he was young offering condolences for the loss of his daughter but also in terms of how he is doing.  To his high school friends reached out through the phone and he had a lengthy conversations with him.  He said he did not realize how much he was cared about and was very thankful that it taken place.  He also had to spend a lot of significant time with his brother and sister-in-law and recently and always enjoys that.  Years ago he used to journal and has gotten out of the habit of it but his wife encouraged him a few weeks ago to start writing letters to his daughter which she has done and says that has been very therapeutic.  It has helped  lift some of the dark times that he has gone through.  He says he tries to focus on the happy thoughts because there were so many with his daughter.  I encouraged him to continue to remain positive and to continue writing letters. We will continue to work on coping skills for helping with his anxiety and the progression of his Lewy body dementia diagnosis as well as processing his grief. Mental Status Exam: Appearance:  Well Groomed     Behavior: Appropriate  Motor: Pt. Reports some instability due to Lewey Body dementia  Speech/Language:  Normal Rate  Affect: Appropriate  Mood: normal  Thought process: normal  Thought content:   WNL  Sensory/Perceptual disturbances:   WNL  Orientation: oriented to person, place, and situation  Attention: Good  Concentration: Good  Memory: Impaired short term  Fund of knowledge:  Good  Insight:   Good  Judgment:  Good  Impulse Control: Good   Risk Assessment: Danger to Self:  No Self-injurious Behavior: No Danger to Others: No Duty to Warn:no Physical Aggression / Violence:No  Access to Firearms a concern: No  Gang Involvement:No  Treatment plan: Will employ cognitive behavioral therapy as well as grief and loss therapy for relief of anxiety and grief.  Goals of grief therapy or to have a healthier response to the loss of his daughter and less sadness as evidenced by patient report in therapy notes as well as to help him feel less overwhelmed by her loss.  Goals are to see a 50% reduction in grief symptoms over the next 6 months.  I will encourage the use of the patient telling his grief story about his daughter including encouraging him to bring pictures and memorabilia to help process his feelings including any feelings of guilt associated with her loss.  We will use cognitive behavioral therapy to help identify and change anxiety producing thoughts and behavior patterns so as to improve his ability to better manage anxiety and stress, and manage  thoughts and worrisome thinking contributing to anxiety.  We will also refresh and encourage dialectical behavior therapy distress tolerance and mindfulness skills with the intention of reducing anxiety by 50% with a target date of August 03, 2023. Progress: 30% July 31, 2023: I reviewed the treatment plan with the patient.  He feels that he is making progress through therapy with anxiety reduction but would like to continue the goals as listed above.  New target date will be February 01, 2024. Interventions: Cognitive Behavioral Therapy  Diagnosis:PTSD, Bi-Polar D/O  Plan: F/U/ appointments scheduled weekly.  [name], [facility]                                                                                                                                      [facility] Counselor/Therapist Progress Note  Patient ID: Stephen Myers, MRN: [id number],    Date: 12/04/2023  Time Spent: 60 minutes spent in person with the patient. Treatment Type: Individual Therapy Reported Symptoms: anxiety, panic attacks The patient continues to struggle with short-term memory.  He is changing some of his providers in the area as he did not get results back after several weeks with minimal response.  He continues to be medication compliant.  He did spend some time with his son, daughter-in-law and grandson and says he is becoming more comfortable with his grandson.  He is also spending some time with his brothers over the next few days which he says is good for his anxiety.  His wife will be retiring next month and so they will be spending more time together.  We did talk more about his daughter and some of his feelings associated with her death.  He continues to grieve in a healthy way.  Lately he has been looking at some of her art work and recognizing the  talent that he already knew she had but appreciates even more so now.  There have also been a couple of former coworkers who have reached out to connect to him which she has appreciated.  I encouraged continued use of his coping skills for anxiety reduction.  He continues to walk daily, fish at a private pond where no one else is when he can especially on Sundays when his wife is with him.  We will continue to work on coping skills for helping with his anxiety and the progression of his Lewy body dementia diagnosis as well as processing his grief. Mental Status Exam: Appearance:  Well Groomed     Behavior: Appropriate  Motor: Pt. Reports some instability due to Lewey Body dementia  Speech/Language:  Normal Rate  Affect: Appropriate  Mood: normal  Thought process: normal  Thought content:   WNL  Sensory/Perceptual disturbances:   WNL  Orientation: oriented to person, place, and situation  Attention: Good  Concentration: Good  Memory: Impaired short term  Fund of knowledge:  Good  Insight:   Good  Judgment:  Good  Impulse Control: Good   Risk Assessment: Danger to Self:  No Self-injurious Behavior: No Danger to Others: No Duty to Warn:no Physical Aggression / Violence:No  Access to Firearms a concern: No  Gang Involvement:No  Treatment plan: Will employ cognitive behavioral therapy as well as grief and loss therapy for relief of anxiety and grief.  Goals of grief therapy or to have a healthier response to the loss of his daughter and less sadness as evidenced by patient report in therapy notes as well as to help him feel less overwhelmed by her loss.  Goals are to see a 50% reduction in grief symptoms over the next 6 months.  I will encourage the use of the patient telling his grief story about his daughter including encouraging him to bring pictures and memorabilia to help process his feelings including any feelings of guilt associated with her loss.  We will use cognitive behavioral  therapy to help identify and change anxiety producing thoughts and behavior patterns so as to improve his ability to better manage anxiety and stress, and manage thoughts and worrisome thinking contributing to anxiety.  We will also refresh and encourage dialectical behavior therapy distress tolerance and mindfulness skills with the intention of reducing anxiety by 50% over the next 6 months. Progress: 30% Interventions: Cognitive Behavioral Therapy  Diagnosis:PTSD, Bi-Polar D/O  Plan: F/U/ appointments scheduled weekly.  [name], [facility]                                                                                                                  [name], [facility]  [facility] Counselor/Therapist Progress Note  Patient ID: Stephen Dannemiller  Myers, MRN: [id number],    Date: 12/04/2023  Time Spent: 60 minutes spent via video session. The pt. Was at ome and this therapist was in his home office. Treatment Type: Individual Therapy Reported Symptoms: anxiety, panic attacks The patient reports that he has been struggling with some of the same thoughts continually over the past few weeks.  Some of them are thoughts that he has dealt with in the past and has not necessarily gotten resolution or answers for but he says they continue to be something he thinks about and they have affected his quality of life and his sleep recently.  We looked at the root cause behind some of these thoughts and why they are being continued and the anxiety that they are creating.  We looked at ways to try to get these thoughts answered in some way and the best way to present those questions but we also looked at what the patient does with those answers if they are not what he expects or if he does not get a clear answer at all.  We talked about the importance of coping with some of the feelings  associated with these thoughts but also how to cognitively reframe or challenge these thoughts if necessary. We will continue to work on coping skills for helping with his anxiety and the progression of his Lewy body dementia diagnosis as well as processing his grief. Mental Status Exam: Appearance:  Well Groomed     Behavior: Appropriate  Motor: Pt. Reports some instability due to Lewey Body dementia  Speech/Language:  Normal Rate  Affect: Appropriate  Mood: normal  Thought process: normal  Thought content:   WNL  Sensory/Perceptual disturbances:   WNL  Orientation: oriented to person, place, and situation  Attention: Good  Concentration: Good  Memory: Impaired short term  Fund of knowledge:  Good  Insight:   Good  Judgment:  Good  Impulse Control: Good   Risk Assessment: Danger to Self:  No Self-injurious Behavior: No Danger to Others: No Duty to Warn:no Physical Aggression / Violence:No  Access to Firearms a concern: No  Gang Involvement:No  Treatment plan: Will employ cognitive behavioral therapy as well as grief and loss therapy for relief of anxiety and grief.  Goals of grief therapy or to have a healthier response to the loss of his daughter and less sadness as evidenced by patient report in therapy notes as well as to help him feel less overwhelmed by her loss.  Goals are to see a 50% reduction in grief symptoms over the next 6 months.  I will encourage the use of the patient telling his grief story about his daughter including encouraging him to bring pictures and memorabilia to help process his feelings including any feelings of guilt associated with her loss.  We will use cognitive behavioral therapy to help identify and change anxiety producing thoughts and behavior patterns so as to improve his ability to better manage anxiety and stress, and manage thoughts and worrisome thinking contributing to anxiety.  We will also refresh and encourage dialectical behavior therapy  distress tolerance and mindfulness skills with the intention of reducing anxiety by 50% over the next 6 months. Progress: 30% Interventions: Cognitive Behavioral Therapy  Diagnosis:PTSD, Bi-Polar D/O  Plan: F/U/ appointments scheduled weekly.  [name], [facility]  [facility] Counselor/Therapist Progress Note  Patient ID: Stephen Myers, MRN: [id number],    Date: 12/04/2023  Time Spent: 60 minutes spent in person with the patient. Treatment Type: Individual Therapy Reported Symptoms: anxiety, panic attacks The patient continues to struggle with short-term memory.  He is changing some of his providers in the area as he did not get results back after several weeks with minimal response.  He continues to be medication compliant.  He did spend some time with his son, daughter-in-law and grandson and says he is becoming more comfortable with his grandson.  He is also spending some time with his brothers over the next few days which he says is good for his anxiety.  His wife will be retiring next month and so they will be spending more time together.  We did talk more about his daughter and some of his feelings associated with her death.  He continues to grieve in a healthy way.  Lately he has been looking at some of her art work and recognizing the talent that he already knew she had but appreciates even more so now.  There have also been a couple of former coworkers who have reached out to connect to him which she has appreciated.  I encouraged continued use of his coping skills for anxiety reduction.  He continues to walk daily, fish at a private pond where no one else is when he can especially on Sundays when his wife is with him.  We will continue to work on coping skills for helping  with his anxiety and the progression of his Lewy body dementia diagnosis as well as processing his grief. Mental Status Exam: Appearance:  Well Groomed     Behavior: Appropriate  Motor: Pt. Reports some instability due to Lewey Body dementia  Speech/Language:  Normal Rate  Affect: Appropriate  Mood: normal  Thought process: normal  Thought content:   WNL  Sensory/Perceptual disturbances:   WNL  Orientation: oriented to person, place, and situation  Attention: Good  Concentration: Good  Memory: Impaired short term  Fund of knowledge:  Good  Insight:   Good  Judgment:  Good  Impulse Control: Good   Risk Assessment: Danger to Self:  No Self-injurious Behavior: No Danger to Others: No Duty to Warn:no Physical Aggression / Violence:No  Access to Firearms a concern: No  Gang Involvement:No  Treatment plan: Will employ cognitive behavioral therapy as well as grief and loss therapy for relief of anxiety and grief.  Goals of grief therapy or to have a healthier response to the loss of his daughter and less sadness as evidenced by patient report in therapy notes as well as to help him feel less overwhelmed by her loss.  Goals are to see a 50% reduction in grief symptoms over the next 6 months.  I will encourage the use of the patient telling his grief story about his daughter including encouraging him to bring pictures and memorabilia to help process his feelings including any feelings of guilt associated with her loss.  We will use cognitive behavioral therapy to help identify and change anxiety producing thoughts and behavior patterns so as to improve his ability to better manage anxiety and stress, and manage thoughts and worrisome thinking contributing to anxiety.  We will also refresh and encourage dialectical behavior therapy distress tolerance and mindfulness skills with the intention of reducing anxiety by 50% over the next 6 months. Progress: 30% Interventions: Cognitive Behavioral  Therapy  Diagnosis:PTSD, Bi-Polar  D/O  Plan: F/U/ appointments scheduled weekly.  [name], [facility]                                                                                            [facility] Counselor/Therapist Progress Note  Patient ID: Stephen Myers, MRN: [id number],    Date: 12/04/2023 This session was held via video teletherapy. The patient consented to the video teletherapy and was located in his home during this session. He is aware it is the responsibility of the patient to secure confidentiality on his end of the session. The provider was in a private home office for the duration of this session.    The patient arrived on time for her Caregility session  Time Spent: 57 minutes, 2 PM to 2:57 PM  Treatment Type: Individual Therapy Reported Symptoms: anxiety, panic attacks The patient and his wife is having some medical issues.  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  Dr. Has recommended an ablation for the cast and another appointment in March of this year if she decides to do that.  He is concerned but knows that they have more time to think about it and decide what the best option is for treatment.  He also has been doing some more research on Lewy body dementia.  He has found that there are periods of stabilization and periods of declining but with every.  The client gets a slightly worse.  He feels that he is in a period of stability right now.  For the most part he has felt well.  He has reached out to people who have reached out to him and talked to his best friend yesterday for the first time in a while.  He feels that he is more stable and balanced.  He is sleeping better.  He says he has decided to make the most out of the times where there is stability and he is positive as he and the others.  I applauded his positive attitude and we talked about  how much difference I can make in terms of healing and dealing with very stressful situation.  We will continue to work on coping skills for helping with his anxiety and the progression of his Lewy body dementia diagnosis as well as processing his grief. Mental Status Exam: Appearance:  Well Groomed     Behavior: Appropriate  Motor: Pt. Reports some instability due to Lewey Body dementia  Speech/Language:  Normal Rate  Affect: Appropriate  Mood: normal  Thought process: normal  Thought content:   WNL  Sensory/Perceptual disturbances:   WNL  Orientation: oriented to person, place, and situation  Attention: Good  Concentration: Good  Memory: Impaired short term  Fund of knowledge:  Good  Insight:   Good  Judgment:  Good  Impulse Control: Good   Risk Assessment: Danger to Self:  No Self-injurious Behavior: No Danger to Others: No Duty to Warn:no Physical Aggression / Violence:No  Access to Firearms a concern: No  Gang Involvement:No  Treatment plan: Will employ cognitive behavioral therapy as well as grief and loss therapy for relief  of anxiety and grief.  Goals of grief therapy or to have a healthier response to the loss of his daughter and less sadness as evidenced by patient report in therapy notes as well as to help him feel less overwhelmed by her loss.  Goals are to see a 50% reduction in grief symptoms over the next 6 months.  I will encourage the use of the patient telling his grief story about his daughter including encouraging him to bring pictures and memorabilia to help process his feelings including any feelings of guilt associated with her loss.  We will use cognitive behavioral therapy to help identify and change anxiety producing thoughts and behavior patterns so as to improve his ability to better manage anxiety and stress, and manage thoughts and worrisome thinking contributing to anxiety.  We will also refresh and encourage dialectical behavior therapy distress tolerance  and mindfulness skills with the intention of reducing anxiety by 50% over the next 6 months. Progress: 30% Interventions: Cognitive Behavioral Therapy  Diagnosis:PTSD, Bi-Polar D/O  Plan: F/U/ appointments scheduled weekly.  [name], [facility]                                                                                                                    [facility] Counselor/Therapist Progress Note  Patient ID: Stephen Myers, MRN: [id number],    Date: 04/17/23  Time Spent: 60 minutes spent via video session. The pt. was at home and this therapist was in his home office.  The patient is aware of the limitations of a telehealth video visit. Treatment Type: Individual Therapy Reported Symptoms: anxiety, panic attacks The patient reports that he has been struggling with some of the same thoughts continually over the past few weeks.  Some of them are thoughts that he has dealt with in the past and has not necessarily gotten resolution or answers for but he says they continue to be something he thinks about and they have affected his quality of life and his sleep recently.  We looked at the root cause behind some of these thoughts and why they are being continued and the anxiety that they are creating.  We looked at ways to try to get these thoughts answered in some way and the best way to present those questions but we also looked at what the patient does with those answers if they are not what he expects or if he does not get a clear answer at all.  We talked about the importance of coping with some of the feelings associated with these thoughts but also how to cognitively reframe or challenge these thoughts if necessary. We will continue to work on coping skills for helping with his anxiety and the progression of his Lewy body dementia diagnosis as well as processing  his grief. Mental Status Exam: Appearance:  Well Groomed     Behavior: Appropriate  Motor: Pt. Reports some instability due to Lewey Body dementia  Speech/Language:  Normal Rate  Affect: Appropriate  Mood:  normal  Thought process: normal  Thought content:   WNL  Sensory/Perceptual disturbances:   WNL  Orientation: oriented to person, place, and situation  Attention: Good  Concentration: Good  Memory: Impaired short term  Fund of knowledge:  Good  Insight:   Good  Judgment:  Good  Impulse Control: Good   Risk Assessment: Danger to Self:  No Self-injurious Behavior: No Danger to Others: No Duty to Warn:no Physical Aggression / Violence:No  Access to Firearms a concern: No  Gang Involvement:No  Treatment plan: Will employ cognitive behavioral therapy as well as grief and loss therapy for relief of anxiety and grief.  Goals of grief therapy or to have a healthier response to the loss of his daughter and less sadness as evidenced by patient report in therapy notes as well as to help him feel less overwhelmed by her loss.  Goals are to see a 50% reduction in grief symptoms over the next 6 months.  I will encourage the use of the patient telling his grief story about his daughter including encouraging him to bring pictures and memorabilia to help process his feelings including any feelings of guilt associated with her loss.  We will use cognitive behavioral therapy to help identify and change anxiety producing thoughts and behavior patterns so as to improve his ability to better manage anxiety and stress, and manage thoughts and worrisome thinking contributing to anxiety.  We will also refresh and encourage dialectical behavior therapy distress tolerance and mindfulness skills with the intention of reducing anxiety by 50% over the next 6 months. Progress: 30% Interventions: Cognitive Behavioral Therapy  Diagnosis:PTSD, Bi-Polar D/O  Plan: F/U/ appointments scheduled weekly.  [name], [facility]                                                                                                     [facility] Counselor/Therapist Progress Note  Patient ID: Stephen Myers, MRN: [id number],    Date: 12/04/2023  Time Spent: 60 minutes spent in person with the patient. Treatment Type: Individual Therapy Reported Symptoms: anxiety, panic attacks The patient continues to struggle with short-term memory.  He is changing some of his providers in the area as he did not get results back after several weeks with minimal response.  He continues to be medication compliant.  He did spend some time with his son, daughter-in-law and grandson and says he is becoming more comfortable with his grandson.  He is also spending some time with his brothers over the next few days which he says is good for his anxiety.  His wife will be retiring next month and so they will be spending more time together.  We did talk more about his daughter and some of his feelings associated with her death.  He continues to grieve in a healthy way.  Lately he has been looking at some of her art work and recognizing the talent that he already knew she had but appreciates even more so now.  There have also been a couple of former coworkers who have reached out  to connect to him which she has appreciated.  I encouraged continued use of his coping skills for anxiety reduction.  He continues to walk daily, fish at a private pond where no one else is when he can especially on Sundays when his wife is with him.  We will continue to work on coping skills for helping with his anxiety and the progression of his Lewy body dementia diagnosis as well as processing his grief. Mental Status Exam: Appearance:  Well Groomed     Behavior: Appropriate  Motor: Pt. Reports some instability due to Lewey Body dementia   Speech/Language:  Normal Rate  Affect: Appropriate  Mood: normal  Thought process: normal  Thought content:   WNL  Sensory/Perceptual disturbances:   WNL  Orientation: oriented to person, place, and situation  Attention: Good  Concentration: Good  Memory: Impaired short term  Fund of knowledge:  Good  Insight:   Good  Judgment:  Good  Impulse Control: Good   Risk Assessment: Danger to Self:  No Self-injurious Behavior: No Danger to Others: No Duty to Warn:no Physical Aggression / Violence:No  Access to Firearms a concern: No  Gang Involvement:No  Treatment plan: Will employ cognitive behavioral therapy as well as grief and loss therapy for relief of anxiety and grief.  Goals of grief therapy or to have a healthier response to the loss of his daughter and less sadness as evidenced by patient report in therapy notes as well as to help him feel less overwhelmed by her loss.  Goals are to see a 50% reduction in grief symptoms over the next 6 months.  I will encourage the use of the patient telling his grief story about his daughter including encouraging him to bring pictures and memorabilia to help process his feelings including any feelings of guilt associated with her loss.  We will use cognitive behavioral therapy to help identify and change anxiety producing thoughts and behavior patterns so as to improve his ability to better manage anxiety and stress, and manage thoughts and worrisome thinking contributing to anxiety.  We will also refresh and encourage dialectical behavior therapy distress tolerance and mindfulness skills with the intention of reducing anxiety by 50% over the next 6 months. Progress: 30% Interventions: Cognitive Behavioral Therapy  Diagnosis:PTSD, Bi-Polar D/O  Plan: F/U/ appointments scheduled weekly.  [name],  [facility]                                                                                                     [place] [facility] Counselor/Therapist Progress Note  Patient ID: Stephen Myers, MRN: [id number],    Date: 12/04/2023  Time Spent: 60 minutes spent in person with the patient. Treatment Type: Individual Therapy Reported Symptoms: anxiety, panic attacks The patient continues to struggle with short-term memory.  He is changing some of his providers in the area as he did not get results back after several weeks with minimal response.  He continues to be medication compliant.  He did spend some time with his son, daughter-in-law and grandson and says he is becoming more  comfortable with his grandson.  He is also spending some time with his brothers over the next few days which he says is good for his anxiety.  His wife will be retiring next month and so they will be spending more time together.  We did talk more about his daughter and some of his feelings associated with her death.  He continues to grieve in a healthy way.  Lately he has been looking at some of her art work and recognizing the talent that he already knew she had but appreciates even more so now.  There have also been a couple of former coworkers who have reached out to connect to him which she has appreciated.  I encouraged continued use of his coping skills for anxiety reduction.  He continues to walk daily, fish at a private pond where no one else is when he can especially on Sundays when his wife is with him.  We will continue to work on coping skills for helping with his anxiety and the progression of his Lewy body dementia diagnosis as well as processing his grief. Mental Status Exam: Appearance:  Well Groomed     Behavior: Appropriate  Motor: Pt. Reports some instability due to Lewey Body dementia  Speech/Language:   Normal Rate  Affect: Appropriate  Mood: normal  Thought process: normal  Thought content:   WNL  Sensory/Perceptual disturbances:   WNL  Orientation: oriented to person, place, and situation  Attention: Good  Concentration: Good  Memory: Impaired short term  Fund of knowledge:  Good  Insight:   Good  Judgment:  Good  Impulse Control: Good   Risk Assessment: Danger to Self:  No Self-injurious Behavior: No Danger to Others: No Duty to Warn:no Physical Aggression / Violence:No  Access to Firearms a concern: No  Gang Involvement:No  Treatment plan: Will employ cognitive behavioral therapy as well as grief and loss therapy for relief of anxiety and grief.  Goals of grief therapy or to have a healthier response to the loss of his daughter and less sadness as evidenced by patient report in therapy notes as well as to help him feel less overwhelmed by her loss.  Goals are to see a 50% reduction in grief symptoms over the next 6 months.  I will encourage the use of the patient telling his grief story about his daughter including encouraging him to bring pictures and memorabilia to help process his feelings including any feelings of guilt associated with her loss.  We will use cognitive behavioral therapy to help identify and change anxiety producing thoughts and behavior patterns so as to improve his ability to better manage anxiety and stress, and manage thoughts and worrisome thinking contributing to anxiety.  We will also refresh and encourage dialectical behavior therapy distress tolerance and mindfulness skills with the intention of reducing anxiety by 50% over the next 6 months. Progress: 30% Interventions: Cognitive Behavioral Therapy  Diagnosis:PTSD, Bi-Polar D/O  Plan: F/U/ appointments scheduled weekly.  [name],  [facility]  [name], [facility]  [place] [facility] Counselor/Therapist Progress Note  Patient ID: Stephen Myers, MRN: [id number],    Date: 12/04/2023  Time Spent: 60 minutes spent via video session. The pt. Was at o[facility] and this therapist was in his home office. Treatment Type: Individual Therapy Reported Symptoms: anxiety, panic attacks The patient reports that he has been struggling with some of the same thoughts continually over the past few weeks.  Some of them are thoughts that he has dealt with in the past and has not necessarily gotten resolution or answers for but he says they continue to be something he thinks about and they have affected his quality of life and his sleep recently.  We looked at the root cause behind some of these thoughts and why they are being continued and the anxiety that they are creating.  We looked at ways to try to get these thoughts answered in some way and the best way to present those questions but we also looked at what the patient does with those answers if they are not what he expects or if he does not get a clear answer at all.  We talked about the importance of coping with some of the feelings associated with these thoughts but also how to cognitively reframe or challenge these thoughts if necessary. We will continue to work on coping skills for helping with his anxiety and the progression of his Lewy body dementia diagnosis as well as processing his grief. Mental Status Exam: Appearance:  Well Groomed     Behavior: Appropriate  Motor: Pt. Reports some instability due to Lewey Body dementia  Speech/Language:  Normal Rate  Affect: Appropriate  Mood: normal  Thought process: normal  Thought content:   WNL  Sensory/Perceptual  disturbances:   WNL  Orientation: oriented to person, place, and situation  Attention: Good  Concentration: Good  Memory: Impaired short term  Fund of knowledge:  Good  Insight:   Good  Judgment:  Good  Impulse Control: Good   Risk Assessment: Danger to Self:  No Self-injurious Behavior: No Danger to Others: No Duty to Warn:no Physical Aggression / Violence:No  Access to Firearms a concern: No  Gang Involvement:No  Treatment plan: Will employ cognitive behavioral therapy as well as grief and loss therapy for relief of anxiety and grief.  Goals of grief therapy or to have a healthier response to the loss of his daughter and less sadness as evidenced by patient report in therapy notes as well as to help him feel less overwhelmed by her loss.  Goals are to see a 50% reduction in grief symptoms over the next 6 months.  I will encourage the use of the patient telling his grief story about his daughter including encouraging him to bring pictures and memorabilia to help process his feelings including any feelings of guilt associated with her loss.  We will use cognitive behavioral therapy to help identify and change anxiety producing thoughts and behavior patterns so as to improve his ability to better manage anxiety and stress, and manage thoughts and worrisome thinking contributing to anxiety.  We will also refresh and encourage dialectical behavior therapy distress tolerance and mindfulness skills with the intention of reducing anxiety by 50% over the next 6 months. Progress: 30% Interventions: Cognitive Behavioral Therapy  Diagnosis:PTSD, Bi-Polar D/O  Plan: F/U/ appointments scheduled weekly.  [name], [facility]  [place] [facility] Counselor/Therapist Progress Note  Patient ID:  Stephen Myers, MRN: [id number],    Date: 12/04/2023  Time Spent: 60 minutes spent in person with the patient. Treatment Type: Individual Therapy Reported Symptoms: anxiety, panic attacks The patient continues to struggle with short-term memory.  He is changing some of his providers in the area as he did not get results back after several weeks with minimal response.  He continues to be medication compliant.  He did spend some time with his son, daughter-in-law and grandson and says he is becoming more comfortable with his grandson.  He is also spending some time with his brothers over the next few days which he says is good for his anxiety.  His wife will be retiring next month and so they will be spending more time together.  We did talk more about his daughter and some of his feelings associated with her death.  He continues to grieve in a healthy way.  Lately he has been looking at some of her art work and recognizing the talent that he already knew she had but appreciates even more so now.  There have also been a couple of former coworkers who have reached out to connect to him which she has appreciated.  I encouraged continued use of his coping skills for anxiety reduction.  He continues to walk daily, fish at a private pond where no one else is when he can especially on Sundays when his wife is with him.  We will continue to work on coping skills for helping with his anxiety and the progression of his Lewy body dementia diagnosis as well as processing his grief. Mental Status Exam: Appearance:  Well Groomed     Behavior: Appropriate  Motor: Pt. Reports some instability due to Lewey Body dementia  Speech/Language:  Normal Rate  Affect: Appropriate  Mood: normal  Thought process: normal  Thought content:   WNL  Sensory/Perceptual disturbances:   WNL  Orientation: oriented to person, place, and situation  Attention: Good  Concentration: Good  Memory: Impaired short term  Fund of  knowledge:  Good  Insight:   Good  Judgment:  Good  Impulse Control: Good   Risk Assessment: Danger to Self:  No Self-injurious Behavior: No Danger to Others: No Duty to Warn:no Physical Aggression / Violence:No  Access to Firearms a concern: No  Gang Involvement:No  Treatment plan: Will employ cognitive behavioral therapy as well as grief and loss therapy for relief of anxiety and grief.  Goals of grief therapy or to have a healthier response to the loss of his daughter and less sadness as evidenced by patient report in therapy notes as well as to help him feel less overwhelmed by her loss.  Goals are to see a 50% reduction in grief symptoms over the next 6 months.  I will encourage the use of the patient telling his grief story about his daughter including encouraging him to bring pictures and memorabilia to help process his feelings including any feelings of guilt associated with her loss.  We will use cognitive behavioral therapy to help identify and change anxiety producing thoughts and behavior patterns so as to improve his ability to better manage anxiety and stress, and manage thoughts and worrisome thinking contributing to anxiety.  We will also refresh and encourage dialectical behavior therapy distress tolerance and mindfulness skills with the intention of reducing anxiety by 50% over the next 6 months. Progress: 30% Interventions: Cognitive Behavioral Therapy  Diagnosis:PTSD, Bi-Polar  D/O  Plan: F/U/ appointments scheduled weekly.  [name], [facility]                                                                                     [place] [facility] Counselor/Therapist Progress Note  Patient ID: Stephen Myers, MRN: [id number],    Date: 12/04/2023 This session was held via video teletherapy. The patient consented to the video teletherapy and was located in his home during this  session. He is aware it is the responsibility of the patient to secure confidentiality on his end of the session. The provider was in a private home office for the duration of this session.    The patient arrived on time for her Caregility session  Time Spent: 57 minutes, 2:00 PM to 2:57 PM  Treatment Type: Individual Therapy Reported Symptoms: anxiety, panic attacks The patient reported that a rare snow event limited him from getting outside and walking is 1 thing that helps keep his anxiety down.  He was not able to walk because of the cold and snow for 2 or 3 days but was finally able to this morning to get out a little bit with his wife and that helps reduce his anxiety.  He also is writing letters to his daughter and journaling.  He also started doing crossword puzzles and word searches and scrambled's to help strengthen his short-term memory.  He says he can remember things very well but is hopeful that doing these type things will at least improve his short-term memory somewhat.  I encouraged continued use of anxiety reduction techniques and including consistent exercise and time with his family.   He does contract for safety having no thoughts of hurting himself or anyone else. Mental Status Exam: Appearance:  Well Groomed     Behavior: Appropriate  Motor: Pt. Reports some instability due to Lewey Body dementia  Speech/Language:  Normal Rate  Affect: Appropriate  Mood: normal  Thought process: normal  Thought content:   WNL  Sensory/Perceptual disturbances:   WNL  Orientation: oriented to person, place, and situation  Attention: Good  Concentration: Good  Memory: Impaired short term  Fund of knowledge:  Good  Insight:   Good  Judgment:  Good  Impulse Control: Good   Risk Assessment: Danger to Self:  No Self-injurious Behavior: No Danger to Others: No Duty to Warn:no Physical Aggression / Violence:No  Access to Firearms a concern: No  Gang Involvement:No  Treatment plan:  Will employ cognitive behavioral therapy as well as grief and loss therapy for relief of anxiety and grief.  Goals of grief therapy or to have a healthier response to the loss of his daughter and less sadness as evidenced by patient report in therapy notes as well as to help him feel less overwhelmed by her loss.  Goals are to see a 50% reduction in grief symptoms over the next 6 months.  I will encourage the use of the patient telling his grief story about his daughter including encouraging him to bring pictures and memorabilia to help process his feelings including any feelings of guilt associated with her loss.  We will use cognitive behavioral  therapy to help identify and change anxiety producing thoughts and behavior patterns so as to improve his ability to better manage anxiety and stress, and manage thoughts and worrisome thinking contributing to anxiety.  We will also refresh and encourage dialectical behavior therapy distress tolerance and mindfulness skills with the intention of reducing anxiety by 50% over the next 6 months. Progress: 30% Of the original target date was July 05, 2023.  I have reviewed the treatment goals and the patient would like to continue with the initial goals with the new target date of January 02, 2024. Diagnosis:PTSD, Bi-Polar D/O  Plan: F/U/ appointments scheduled weekly.  [name], [facility]                                                                                                                 [name], [facility]               [name], [facility]               [name], [facility]               [name], [facility]               [name], [facility]               [name], [facility]               [name], [facility]               [name], [facility]               [name], [facility]               [name], [facility]               [name], [facility]               [name], [facility]               [name], [facility]               [name], [facility]               [name], [facility]               [name], [facility]               [name], [facility]               [name], [facility]               [name], [facility]               [name], [facility]               [name], [facility]  [name], [facility]

## 2023-12-11 ENCOUNTER — Ambulatory Visit: Payer: Medicare Other | Admitting: Behavioral Health

## 2023-12-18 ENCOUNTER — Encounter: Payer: Self-pay | Admitting: Behavioral Health

## 2023-12-18 ENCOUNTER — Ambulatory Visit: Payer: Medicare Other | Admitting: Behavioral Health

## 2023-12-18 DIAGNOSIS — F4321 Adjustment disorder with depressed mood: Secondary | ICD-10-CM

## 2023-12-18 DIAGNOSIS — F319 Bipolar disorder, unspecified: Secondary | ICD-10-CM

## 2023-12-18 DIAGNOSIS — F3181 Bipolar II disorder: Secondary | ICD-10-CM

## 2023-12-18 DIAGNOSIS — F4310 Post-traumatic stress disorder, unspecified: Secondary | ICD-10-CM

## 2023-12-18 NOTE — Progress Notes (Signed)
                                                                                                                                                                                                                                                                                                                                                                                                                                                                                                                                                                                                                             [place] [facility] Counselor/Therapist Progress Note  Patient ID: Stephen Myers, MRN: [id number],    Date: 12/18/2023 This session was held via video teletherapy. The patient consented to the video teletherapy and was located in his home during this session. He is aware it is the responsibility of the patient to secure confidentiality on his end of the session. The provider was in a private home office for the duration of this session.    The patient arrived on time for her Caregility session  Time Spent: 57 minutes, 2 PM to 2:57 PM  Treatment Type: Individual Therapy Reported Symptoms: anxiety, panic attacks The patient missed his session last week because he had fallen and hurt his back.  He is still in  significant pain.  He also has been having a lot of knee pain but feels the physical therapy is helping her with that.  He is not getting a lot of relief for his back but nothing was fractured or broken to his knowledge.  He is still continuing to try and walk every morning.  He acknowledges especially today feeling more depressed because of all the physical stuff that has been going on.  He is trying to do things to keep his mind busy and his mood elevated.  They are going to see his brother and sister-in-law in a couple of weeks.  They plan to go to [place] sometime in March because their daughter's name will be a part of a wall that is at the [facility] building.  He feels he can handle that well but we talked through being on the train that along especially if in physical pain.  Reminded him of the size of the seats and the ability to get up and walk around even when driving and also when they stopped at certain stations.  I told him I thought that would be good for him to be able to do that.   We will continue to work on coping skills for helping with his anxiety and the progression of his Lewy body dementia diagnosis as well as processing his grief. Mental Status Exam: Appearance:  Well Groomed     Behavior: Appropriate  Motor: Pt. Reports some instability due to Lewey Body dementia  Speech/Language:  Normal Rate  Affect: Appropriate  Mood: normal  Thought process: normal  Thought content:   WNL  Sensory/Perceptual disturbances:   WNL  Orientation: oriented to person, place, and situation  Attention: Good  Concentration: Good  Memory: Impaired short term  Fund of knowledge:  Good  Insight:   Good  Judgment:  Good  Impulse Control: Good   Risk Assessment: Danger to Self:  No Self-injurious Behavior: No Danger to Others: No Duty to Warn:no Physical Aggression / Violence:No  Access to Firearms a concern: No  Gang Involvement:No  Treatment plan: Will employ cognitive behavioral  therapy as well as grief and loss therapy for relief of anxiety and grief.  Goals of grief therapy or to have a healthier response to the loss of his daughter and less sadness as evidenced by patient report in therapy notes as well as to help him feel less overwhelmed by her loss.  Goals are to see a 50% reduction in grief symptoms over the next 6 months.  I will encourage the use of the patient telling his grief story about his daughter including encouraging him to bring pictures and memorabilia to help process his feelings including any feelings of guilt associated with her loss.  We will use cognitive behavioral therapy to help identify and change anxiety producing thoughts and behavior patterns so as to improve his ability to better manage anxiety and stress, and manage thoughts and worrisome thinking contributing to anxiety.  We will also refresh and encourage dialectical behavior therapy distress tolerance and mindfulness skills with the intention of reducing anxiety by 50% over the next 6 months. Progress: 30% Interventions: Cognitive Behavioral Therapy  Diagnosis:PTSD, Bi-Polar D/O  Plan: F/U/ appointments scheduled weekly.  [name], [facility]                                                                                                                   [name], [facility]

## 2023-12-25 ENCOUNTER — Encounter: Payer: Self-pay | Admitting: Behavioral Health

## 2023-12-25 ENCOUNTER — Ambulatory Visit: Payer: Medicare Other | Admitting: Behavioral Health

## 2023-12-25 DIAGNOSIS — F4310 Post-traumatic stress disorder, unspecified: Secondary | ICD-10-CM | POA: Diagnosis not present

## 2023-12-25 DIAGNOSIS — F3181 Bipolar II disorder: Secondary | ICD-10-CM

## 2023-12-25 NOTE — Progress Notes (Signed)
                                                                                                                                                                                                                                                                                                                                                                                                                                                                                                                                                                                                                             [facility] Counselor/Therapist Progress Note  Patient ID: Stephen Myers, MRN: [id number],    Date: 12/25/2023 This session was held via video teletherapy. The patient consented to the video teletherapy and was located in his home during this session. He is aware it is the responsibility of the patient to secure confidentiality on his end of the session. The provider was in a private home office for the duration of this session.    The patient arrived on time for her Caregility session  Time Spent: 57 minutes, 2 PM to 2:57 PM  Treatment Type: Individual Therapy Reported Symptoms: anxiety, panic attacks It has been a tough week physically for the patient.  He is still having issues with his back.  He said  the physical therapist that he was seeing did not seem to understand what he could and could not do so he is canceled in the future appointments.  He will meet with his primary care physician soon to see if he might get referral to someone else.  The PT he worked with previous to that had been very helpful and that he got more stable but he feels like that from doing things which cannot be fixed with physical therapy, he has not gotten stronger and he has not regained some of the balance that he lost.  He is not sure of any specific trigger but says over the past week or so he has just been more angry in relation to his diagnosis and is daughter's death.  He still feels that he is grieving appropriately and I validated that anger is still valid on motion based on all that he is going through.  He is angry that he has this diagnosis.  He is angry over the loss of his daughter.  He is angry that his wife and son have to go through this with him.  He acknowledges that at times there are dark thoughts but says he does contract for safety and will not help himself.  Thinking about his daughter and the good times is a lot of what helps him overcome some of those dark thoughts.  He knows that as the weather and his pain improved he will continue to walk and keep his mind and hands as busy as he can.  I will continue to meet with him weekly.  He also finds enjoyment spending time with his wife son and daughter-in-law and his grandson. We will continue to work on coping skills for helping with his anxiety and the progression of his Lewy body dementia diagnosis as well as processing his grief. Mental Status Exam: Appearance:  Well Groomed     Behavior: Appropriate  Motor: Pt. Reports some instability due to Lewey Body dementia  Speech/Language:  Normal Rate  Affect: Appropriate  Mood: normal  Thought process: normal  Thought content:   WNL  Sensory/Perceptual disturbances:   WNL  Orientation: oriented to person,  place, and situation  Attention: Good  Concentration: Good  Memory: Impaired short term  Fund of knowledge:  Good  Insight:   Good  Judgment:  Good  Impulse Control: Good   Risk Assessment: Danger to Self:  No Self-injurious Behavior: No Danger to Others: No Duty to Warn:no Physical Aggression / Violence:No  Access to Firearms a concern: No  Gang Involvement:No  Treatment plan: Will employ cognitive behavioral therapy as well as grief and loss therapy for relief of anxiety and grief.  Goals of grief therapy or to have a healthier  response to the loss of his daughter and less sadness as evidenced by patient report in therapy notes as well as to help him feel less overwhelmed by her loss.  Goals are to see a 50% reduction in grief symptoms over the next 6 months.  I will encourage the use of the patient telling his grief story about his daughter including encouraging him to bring pictures and memorabilia to help process his feelings including any feelings of guilt associated with her loss.  We will use cognitive behavioral therapy to help identify and change anxiety producing thoughts and behavior patterns so as to improve his ability to better manage anxiety and stress, and manage thoughts and worrisome thinking contributing to anxiety.  We will also refresh and encourage dialectical behavior therapy distress tolerance and mindfulness skills with the intention of reducing anxiety by 50% over the next 6 months. Progress: 30% Interventions: Cognitive Behavioral Therapy  Diagnosis:PTSD, Bi-Polar D/O  Plan: F/U/ appointments scheduled weekly.  [name], [facility]                                                                                                                   [name],  [facility]   Behavioral Health Counselor/Therapist Progress Note  Patient ID: Stephen Myers, MRN: [id number],    Date: 12/25/2023 This session was held via video teletherapy. The patient consented to the video teletherapy and was located in his home during this session. He is aware it is the responsibility of the patient to secure confidentiality on his end of the session. The provider was in a private home office for the duration of this session.    The patient arrived on time for her Caregility session  Time Spent: 57 minutes, 2 PM to 2:57 PM  Treatment Type: Individual Therapy Reported Symptoms: anxiety, panic attacks The patient missed his session last week because he had fallen and hurt his back.  He is still in  significant pain.  He also has been having a lot of knee pain but feels the physical therapy is helping her with that.  He is not getting a lot of relief for his back but nothing was fractured or broken to his knowledge.  He is still continuing to try and walk every morning.  He acknowledges especially today feeling more depressed because of all the physical stuff that has been going on.  He is trying to do things to keep his mind busy and his mood elevated.  They are going to see his brother and sister-in-law in a couple of weeks.  They plan to go to [place] sometime in March because their daughter's name will be a part of a wall that is at the [facility] building.  He feels he can handle that well but we talked through being on the train that along especially if in physical pain.  Reminded him of the size of the seats and the ability to get up and walk around even when driving and also when they stopped at certain stations.  I told him I thought that would be good for him to be able to do that.   We will continue to work on coping skills for helping with his anxiety and the progression of his Lewy body dementia diagnosis as well as processing his grief. Mental Status Exam: Appearance:  Well Groomed     Behavior: Appropriate  Motor: Pt. Reports some instability due to Lewey Body dementia  Speech/Language:  Normal Rate  Affect: Appropriate  Mood: normal  Thought process: normal  Thought content:   WNL  Sensory/Perceptual disturbances:   WNL  Orientation: oriented to person, place, and situation  Attention: Good  Concentration: Good  Memory: Impaired short term  Fund of knowledge:  Good  Insight:   Good  Judgment:  Good  Impulse Control: Good   Risk Assessment: Danger to Self:  No Self-injurious Behavior: No Danger to Others: No Duty to Warn:no Physical Aggression / Violence:No  Access to Firearms a concern: No  Gang Involvement:No  Treatment plan: Will employ cognitive behavioral  therapy as well as grief and loss therapy for relief of anxiety and grief.  Goals of grief therapy or to have a healthier response to the loss of his daughter and less sadness as evidenced by patient report in therapy notes as well as to help him feel less overwhelmed by her loss.  Goals are to see a 50% reduction in grief symptoms over the next 6 months.  I will encourage the use of the patient telling his grief story about his daughter including encouraging him to bring pictures and memorabilia to help process his feelings including any feelings of guilt associated with her loss.  We will use cognitive behavioral therapy to help identify and change anxiety producing thoughts and behavior patterns so as to improve his ability to better manage anxiety and stress, and manage thoughts and worrisome thinking contributing to anxiety.  We will also refresh and encourage dialectical behavior therapy distress tolerance and mindfulness skills with the intention of reducing anxiety by 50% over the next 6 months. Progress: 30% Interventions: Cognitive Behavioral Therapy  Diagnosis:PTSD, Bi-Polar D/O  Plan: F/U/ appointments scheduled weekly.  [name], [facility]                                                                                                                   [name], [facility]               [name], [facility]

## 2024-01-01 ENCOUNTER — Ambulatory Visit: Payer: Medicare Other | Admitting: Behavioral Health

## 2024-01-08 ENCOUNTER — Ambulatory Visit: Payer: Medicare Other | Admitting: Behavioral Health

## 2024-01-08 ENCOUNTER — Encounter: Payer: Self-pay | Admitting: Behavioral Health

## 2024-01-08 DIAGNOSIS — F319 Bipolar disorder, unspecified: Secondary | ICD-10-CM | POA: Diagnosis not present

## 2024-01-08 DIAGNOSIS — F4310 Post-traumatic stress disorder, unspecified: Secondary | ICD-10-CM

## 2024-01-08 NOTE — Progress Notes (Signed)
 [facility] Counselor/Therapist Progress Note  Patient ID: Stephen Myers, MRN: [id number],    Date: 01/08/2024 This session was held via video teletherapy. The patient consented to the video teletherapy and was located in his home during this session. He is aware it is the responsibility of the patient to secure confidentiality on his end of the session. The provider was in a private home office for the duration of this session.    The patient arrived on time for her Caregility session  Time Spent: 57 minutes, 2 PM to 2:57 PM  Treatment Type: Individual Therapy Reported Symptoms: anxiety, panic attacks The patient is still having significant back pain.  He cannot sit anywhere for very long and has to be careful  where he sits.  There is still some unsteadiness although he did not report any falls over the past 2 weeks.  His biggest concern now is for his wife.  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  She is meeting with her cardiologist again next week to see what possible treatment is for her.  He still thinks there may be some issue with her diaphragm but she is meeting with the pulmonologist also.  He is doing what he can to help her.  His daughter's birthday is March 11 so he knows that is a difficult time for both he and his wife next week.  Typically they do something to remember her but because of the wife's current health issues they cannot go to [place] as they had planned so they are talking about what to do to remember and celebrate her birthday.  We will continue to work on coping skills for helping with his anxiety and the progression of his Lewy body dementia diagnosis as well as processing his grief. Mental Status Exam: Appearance:  Well Groomed     Behavior: Appropriate  Motor: Pt. Reports some instability due to Lewey Body dementia  Speech/Language:  Normal Rate  Affect: Appropriate  Mood: normal  Thought process: normal  Thought content:   WNL  Sensory/Perceptual disturbances:   WNL  Orientation: oriented to person, place, and situation  Attention: Good  Concentration: Good  Memory: Impaired short term  Fund of knowledge:  Good  Insight:   Good  Judgment:  Good  Impulse Control: Good   Risk Assessment: Danger to Self:  No Self-injurious Behavior: No Danger to Others: No Duty to Warn:no Physical Aggression / Violence:No  Access to Firearms a concern: No  Gang Involvement:No  Treatment plan: Will employ cognitive behavioral therapy as well as grief and loss therapy for relief of anxiety and grief.  Goals of grief therapy or to have a healthier response to the loss of his daughter and  less sadness as evidenced by patient report in therapy notes as well as to help him feel less overwhelmed by her loss.  Goals are to see a 50% reduction in grief symptoms over the next 6 months.  I will encourage the use of the patient telling his grief story about his daughter including encouraging him to bring pictures and memorabilia to help process his feelings including any feelings of guilt associated with her loss.  We will use cognitive behavioral therapy to help identify and change anxiety producing thoughts and behavior patterns so as to improve his ability to better manage anxiety and stress, and manage thoughts and worrisome thinking contributing to anxiety.  We will also refresh and encourage dialectical behavior therapy distress tolerance and mindfulness skills with the intention of reducing anxiety by 50% over the next 6 months. Progress: 30% Interventions: Cognitive Behavioral Therapy  Diagnosis:PTSD, Bi-Polar D/O  Plan: F/U/ appointments scheduled weekly.  [name], [facility]                                                                                                                   [name],  [facility]  [facility] Counselor/Therapist Progress Note  Patient ID: Stephen Myers, MRN: [id number],    Date: 01/08/2024 This session was held via video teletherapy. The patient consented to the video teletherapy and was located in his home during this session. He is aware it is the responsibility of the patient to secure confidentiality on his end of the session. The provider was in a private home office for the duration of this session.    The patient arrived on time for her Caregility session  Time Spent: 57 minutes, 2 PM to 2:57 PM  Treatment Type: Individual Therapy Reported Symptoms: anxiety, panic attacks The patient missed his session last week because he had fallen and hurt his back.  He is still in  significant pain.  He also has been having a lot of knee pain but feels the physical therapy is helping her with that.  He is not getting a lot of relief for his back but nothing was fractured or broken to his knowledge.  He is still continuing to try and walk every morning.  He acknowledges especially today feeling more depressed because of all the physical stuff that has been going on.  He is trying to do things to keep his mind busy and his mood elevated.  They are going to see his brother and sister-in-law in a couple of weeks.  They plan to go to [place] sometime in March because their daughter's name will be a part of a wall that is at the [facility] building.  He feels he can handle that well but we talked through being on the train that along especially if in physical pain.  Reminded him of the size of the seats and the ability to get up and walk around even when driving and also when they stopped at certain stations.  I told him I thought that would be good for him to be able to do that.   We will continue to work on coping skills for helping with his anxiety and the progression of his Lewy body dementia diagnosis as well as processing his grief. Mental Status Exam: Appearance:  Well Groomed     Behavior: Appropriate  Motor: Pt. Reports some instability due to Lewey Body dementia  Speech/Language:  Normal Rate  Affect: Appropriate  Mood: normal  Thought process: normal  Thought content:   WNL  Sensory/Perceptual disturbances:   WNL  Orientation: oriented to person, place, and situation  Attention: Good  Concentration: Good  Memory: Impaired short term  Fund of knowledge:  Good  Insight:   Good  Judgment:  Good  Impulse Control: Good   Risk Assessment: Danger to Self:  No Self-injurious Behavior: No Danger to Others: No Duty to Warn:no Physical Aggression / Violence:No  Access to Firearms a concern: No  Gang Involvement:No  Treatment plan: Will employ cognitive behavioral  therapy as well as grief and loss therapy for relief of anxiety and grief.  Goals of grief therapy or to have a healthier response to the loss of his daughter and less sadness as evidenced by patient report in therapy notes as well as to help him feel less overwhelmed by her loss.  Goals are to see a 50% reduction in grief symptoms over the next 6 months.  I will encourage the use of the patient telling his grief story about his daughter including encouraging him to bring pictures and memorabilia to help process his feelings including any feelings of guilt associated with her loss.  We will use cognitive behavioral therapy to help identify and change anxiety producing thoughts and behavior patterns so as to improve his ability to better manage anxiety and stress, and manage thoughts and worrisome thinking contributing to anxiety.  We will also refresh and encourage dialectical behavior therapy distress tolerance and mindfulness skills with the intention of reducing anxiety by 50% over the next 6 months. Progress: 30% Interventions: Cognitive Behavioral Therapy  Diagnosis:PTSD, Bi-Polar D/O  Plan: F/U/ appointments scheduled weekly.  [name], [facility]                                                                                                                   [name], [facility]               [name], [facility]               [name], [facility]

## 2024-01-15 ENCOUNTER — Encounter: Payer: Self-pay | Admitting: Behavioral Health

## 2024-01-15 ENCOUNTER — Ambulatory Visit: Payer: Medicare Other | Admitting: Behavioral Health

## 2024-01-15 DIAGNOSIS — F319 Bipolar disorder, unspecified: Secondary | ICD-10-CM | POA: Diagnosis not present

## 2024-01-15 DIAGNOSIS — F3181 Bipolar II disorder: Secondary | ICD-10-CM

## 2024-01-15 DIAGNOSIS — F4310 Post-traumatic stress disorder, unspecified: Secondary | ICD-10-CM | POA: Diagnosis not present

## 2024-01-15 NOTE — Progress Notes (Signed)
 [facility] Counselor/Therapist Progress Note  Patient ID: Stephen Myers, MRN: [id number],    Date: 01/15/2024 This session was held via video teletherapy. The patient consented to the video teletherapy and was located in his home during this session. He is aware it is the responsibility of the patient to secure confidentiality on his end of the session. The provider was in a private home office for the duration of this session.    The patient arrived on time for her Caregility session  Time Spent: 57 minutes, 2 PM to 2:57 PM  Treatment Type: Individual Therapy Reported Symptoms: anxiety, panic attacks The patient is still having significant back pain.  He cannot sit anywhere for very long and has to be careful  where he sits.  There is still some unsteadiness although he did not report any falls over the past 2 weeks.  His biggest concern now is for his wife.  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  She is meeting with her cardiologist again next week to see what possible treatment is for her.  He still thinks there may be some issue with her diaphragm but she is meeting with the pulmonologist also.  He is doing what he can to help her.  His daughter's birthday is March 11 so he knows that is a difficult time for both he and his wife next week.  Typically they do something to remember her but because of the wife's current health issues they cannot go to [place] as they had planned so they are talking about what to do to remember and celebrate her birthday.  We will continue to work on coping skills for helping with his anxiety and the progression of his Lewy body dementia diagnosis as well as processing his grief. Mental Status Exam: Appearance:  Well Groomed     Behavior: Appropriate  Motor: Pt. Reports some instability due to Lewey Body dementia  Speech/Language:  Normal Rate  Affect: Appropriate  Mood: normal  Thought process: normal  Thought content:   WNL  Sensory/Perceptual disturbances:   WNL  Orientation: oriented to person, place, and situation  Attention: Good  Concentration: Good  Memory: Impaired short term  Fund of knowledge:  Good  Insight:   Good  Judgment:  Good  Impulse Control: Good   Risk Assessment: Danger to Self:  No Self-injurious Behavior: No Danger to Others: No Duty to Warn:no Physical Aggression / Violence:No  Access to Firearms a concern: No  Gang Involvement:No  Treatment plan: Will employ cognitive behavioral therapy as well as grief and loss therapy for relief of anxiety and grief.  Goals of grief therapy or to have a healthier response to the loss of his daughter and  less sadness as evidenced by patient report in therapy notes as well as to help him feel less overwhelmed by her loss.  Goals are to see a 50% reduction in grief symptoms over the next 6 months.  I will encourage the use of the patient telling his grief story about his daughter including encouraging him to bring pictures and memorabilia to help process his feelings including any feelings of guilt associated with her loss.  We will use cognitive behavioral therapy to help identify and change anxiety producing thoughts and behavior patterns so as to improve his ability to better manage anxiety and stress, and manage thoughts and worrisome thinking contributing to anxiety.  We will also refresh and encourage dialectical behavior therapy distress tolerance and mindfulness skills with the intention of reducing anxiety by 50% over the next 6 months. Progress: 30% Interventions: Cognitive Behavioral Therapy  Diagnosis:PTSD, Bi-Polar D/O  Plan: F/U/ appointments scheduled weekly.  [name], [facility]                                                                                                                   [name],  [facility]  [facility] Counselor/Therapist Progress Note  Patient ID: Stephen Myers, MRN: [id number],    Date: 01/15/2024 This session was held via video teletherapy. The patient consented to the video teletherapy and was located in his home during this session. He is aware it is the responsibility of the patient to secure confidentiality on his end of the session. The provider was in a private home office for the duration of this session.    The patient arrived on time for her Caregility session  Time Spent: 58 minutes, 2 PM to 2:58 PM  Treatment Type: Individual Therapy Reported Symptoms: anxiety, panic attacks A fairly difficult week because it was his daughter's birthday.  He said this year was more  difficult than last year but he could not pinpoint why.  They did remember her about going out to a nice restaurant which is something her daughter always wanted to do.  He said they were able to laugh and joke about funny things that his daughter had said or done which is not something they were able to do this time last year so he saw that as healthy grieving.  He knows that the time change threw things off a little bit for everybody and his wife still is not feeling well and his back has been bothering him.  He was not able to go to his first round of physical therapy this week because his back was bothering him more.  He has been thinking a lot more about his daughter which he feels comfortable with.  He is thankful that it is getting warmer and staying light longer so he can get back to some of the coping skills such as fishing that he has done in the past.   We will continue to work on coping skills for helping with his anxiety and the progression of his Lewy body dementia diagnosis as well as processing his grief. Mental Status Exam: Appearance:  Well Groomed     Behavior: Appropriate  Motor: Pt. 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  Speech/Language:  Normal Rate  Affect: Appropriate  Mood: normal  Thought process: normal  Thought content:   WNL  Sensory/Perceptual disturbances:   WNL  Orientation: oriented to person, place, and situation  Attention: Good  Concentration: Good  Memory: Impaired short term  Fund of knowledge:  Good  Insight:   Good  Judgment:  Good  Impulse Control: Good   Risk Assessment: Danger to Self:  No Self-injurious Behavior: No Danger to Others: No Duty to Warn:no Physical Aggression / Violence:No  Access to Firearms a concern: No  Gang Involvement:No  Treatment plan: Will employ cognitive behavioral therapy as well as grief and loss therapy for relief of anxiety and grief.  Goals of grief therapy or to have a healthier response to the loss  of his daughter and less sadness as evidenced by patient report in therapy notes as well as to help him feel less overwhelmed by her loss.  Goals are to see a 50% reduction in grief symptoms over the next 6 months.  I will encourage the use of the patient telling his grief story about his daughter including encouraging him to bring pictures and memorabilia to help process his feelings including any feelings of guilt associated with her loss.  We will use cognitive behavioral therapy to help identify and change anxiety producing thoughts and behavior patterns so as to improve his ability to better manage anxiety and stress, and manage thoughts  and worrisome thinking contributing to anxiety.  We will also refresh and encourage dialectical behavior therapy distress tolerance and mindfulness skills with the intention of reducing anxiety by 50% over the next 6 months. Progress: 30% Interventions: Cognitive Behavioral Therapy  Diagnosis:PTSD, Bi-Polar D/O  Plan: F/U/ appointments scheduled weekly.  [name], [facility]                                                                                                                   [name], [facility]               [name], [facility]               [name],  [facility]  [facility] Counselor/Therapist Progress Note  Patient ID: Stephen Myers, MRN: [id number],    Date: 01/15/2024 This session was held via video teletherapy. The patient consented to the video teletherapy and was located in his home during this session. He is aware it is the responsibility of the patient to secure confidentiality on his end of the session. The provider was in a private home office for the duration of this session.    The patient arrived on time for her Caregility session  Time Spent: 57 minutes, 2 PM to 2:57 PM  Treatment Type: Individual Therapy Reported Symptoms: anxiety, panic attacks The patient is still having significant back pain.  He cannot sit anywhere for very long and has to be  careful where he sits.  There is still some unsteadiness although he did not report any falls over the past 2 weeks.  His biggest concern now is for his wife.  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  She is meeting with her cardiologist again next week to see what possible treatment is for her.  He still thinks there may be some issue with her diaphragm but she is meeting with the pulmonologist also.  He is doing what he can to help her.  His daughter's birthday is March 11 so he knows that is a difficult time for both he and his wife next week.  Typically they do something to remember her but because of the wife's current health issues they cannot go to [place] as they had planned so they are talking about what to do to remember and celebrate her birthday.  We will continue to work on coping skills for helping with his anxiety and the progression of his Lewy body dementia diagnosis as well as processing his grief. Mental Status Exam: Appearance:  Well Groomed     Behavior: Appropriate  Motor: Pt. Reports some instability due to Lewey Body dementia  Speech/Language:  Normal Rate  Affect: Appropriate  Mood: normal  Thought process: normal  Thought content:   WNL  Sensory/Perceptual disturbances:   WNL  Orientation: oriented to person, place, and situation  Attention: Good  Concentration: Good  Memory: Impaired short term  Fund of knowledge:  Good  Insight:   Good  Judgment:  Good  Impulse Control: Good   Risk Assessment: Danger to Self:  No Self-injurious Behavior: No Danger to Others: No Duty to Warn:no Physical Aggression / Violence:No  Access to Firearms a concern: No  Gang Involvement:No  Treatment plan: Will employ cognitive behavioral therapy as well as grief and loss therapy for relief of anxiety and grief.  Goals of grief therapy or to have a healthier response to the loss of his daughter  and less sadness as evidenced by patient report in therapy notes as well as to help him feel less overwhelmed by her loss.  Goals are to see a 50% reduction in grief symptoms over the next 6 months.  I will encourage the use of the patient telling his grief story about his daughter including encouraging him to bring pictures and memorabilia to help process his feelings including any feelings of guilt associated with her loss.  We will use cognitive behavioral therapy to help identify and change anxiety producing thoughts and behavior patterns so as to improve his ability to better manage anxiety and stress, and manage thoughts and worrisome thinking contributing  to anxiety.  We will also refresh and encourage dialectical behavior therapy distress tolerance and mindfulness skills with the intention of reducing anxiety by 50% over the next 6 months. Progress: 30% Interventions: Cognitive Behavioral Therapy  Diagnosis:PTSD, Bi-Polar D/O  Plan: F/U/ appointments scheduled weekly.  [name], [facility]                                                                                                                   [name],  [facility]  [facility] Counselor/Therapist Progress Note  Patient ID: Stephen Myers, MRN: [id number],    Date: 01/15/2024 This session was held via video teletherapy. The patient consented to the video teletherapy and was located in his home during this session. He is aware it is the responsibility of the patient to secure confidentiality on his end of the session. The provider was in a private home office for the duration of this session.    The patient arrived on time for her Caregility session  Time Spent: 57 minutes, 2 PM to 2:57 PM  Treatment Type: Individual Therapy Reported Symptoms: anxiety, panic attacks The patient missed his session last week because he had fallen and hurt his back.  He is still in  significant pain.  He also has been having a lot of knee pain but feels the physical therapy is helping her with that.  He is not getting a lot of relief for his back but nothing was fractured or broken to his knowledge.  He is still continuing to try and walk every morning.  He acknowledges especially today feeling more depressed because of all the physical stuff that has been going on.  He is trying to do things to keep his mind busy and his mood elevated.  They are going to see his brother and sister-in-law in a couple of weeks.  They plan to go to [place] sometime in March because their daughter's name will be a part of a wall that is at the [facility] building.  He feels he can handle that well but we talked through being on the train that along especially if in physical pain.  Reminded him of the size of the seats and the ability to get up and walk around even when driving and also when they stopped at certain stations.  I told him I thought that would be good for him to be able to do that.   We will continue to work on coping skills for helping with his anxiety and the progression of his Lewy body dementia diagnosis as well as processing his grief. Mental Status Exam: Appearance:  Well Groomed     Behavior: Appropriate  Motor: Pt. Reports some instability due to Lewey Body dementia  Speech/Language:  Normal Rate  Affect: Appropriate  Mood: normal  Thought process: normal  Thought content:   WNL  Sensory/Perceptual disturbances:   WNL  Orientation: oriented to person, place, and situation  Attention: Good  Concentration: Good  Memory: Impaired short term  Fund of knowledge:  Good  Insight:   Good  Judgment:  Good  Impulse Control: Good   Risk Assessment: Danger to Self:  No Self-injurious Behavior: No Danger to Others: No Duty to Warn:no Physical Aggression / Violence:No  Access to Firearms a concern: No  Gang Involvement:No  Treatment plan: Will employ cognitive behavioral  therapy as well as grief and loss therapy for relief of anxiety and grief.  Goals of grief therapy or to have a healthier response to the loss of his daughter and less sadness as evidenced by patient report in therapy notes as well as to help him feel less overwhelmed by her loss.  Goals are to see a 50% reduction in grief symptoms over the next 6 months.  I will encourage the use of the patient telling his grief story about his daughter including encouraging him to bring pictures and memorabilia to help process his feelings including any feelings of guilt associated with her loss.  We will use cognitive behavioral therapy to help identify and change anxiety producing thoughts and behavior patterns so as to improve his ability to better manage anxiety and stress, and manage thoughts and worrisome thinking contributing to anxiety.  We will also refresh and encourage dialectical behavior therapy distress tolerance and mindfulness skills with the intention of reducing anxiety by 50% over the next 6 months. Progress: 30% Interventions: Cognitive Behavioral Therapy  Diagnosis:PTSD, Bi-Polar D/O  Plan: F/U/ appointments scheduled weekly.  [name], [facility]                                                                                                                   [name], [facility]               [name], [facility]               [name], [facility]               [name], [facility]

## 2024-01-22 ENCOUNTER — Encounter: Payer: Self-pay | Admitting: Behavioral Health

## 2024-01-22 ENCOUNTER — Ambulatory Visit: Payer: Medicare Other | Admitting: Behavioral Health

## 2024-01-22 DIAGNOSIS — F319 Bipolar disorder, unspecified: Secondary | ICD-10-CM | POA: Diagnosis not present

## 2024-01-22 DIAGNOSIS — F3181 Bipolar II disorder: Secondary | ICD-10-CM

## 2024-01-22 DIAGNOSIS — F4310 Post-traumatic stress disorder, unspecified: Secondary | ICD-10-CM

## 2024-01-22 NOTE — Progress Notes (Signed)
 [facility] Counselor/Therapist Progress Note  Patient ID: Stephen Myers, MRN: [id number],    Date: 01/22/2024 This session was held via video teletherapy. The patient consented to the video teletherapy and was located in his home during this session. He is aware it is the responsibility of the patient to secure confidentiality on his end of the session. The provider was in a private home office for the duration of this session.    The patient arrived on time for her Caregility session  Time Spent: 57 minutes, 2 PM to 2:57 PM  Treatment Type: Individual Therapy Reported Symptoms: anxiety, panic attacks The patient is still having significant back pain.  He cannot sit anywhere for very long and has to be careful  where he sits.  There is still some unsteadiness although he did not report any falls over the past 2 weeks.  His biggest concern now is for his wife.  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  She is meeting with her cardiologist again next week to see what possible treatment is for her.  He still thinks there may be some issue with her diaphragm but she is meeting with the pulmonologist also.  He is doing what he can to help her.  His daughter's birthday is March 11 so he knows that is a difficult time for both he and his wife next week.  Typically they do something to remember her but because of the wife's current health issues they cannot go to [place] as they had planned so they are talking about what to do to remember and celebrate her birthday.  We will continue to work on coping skills for helping with his anxiety and the progression of his Lewy body dementia diagnosis as well as processing his grief. Mental Status Exam: Appearance:  Well Groomed     Behavior: Appropriate  Motor: Pt. Reports some instability due to Lewey Body dementia  Speech/Language:  Normal Rate  Affect: Appropriate  Mood: normal  Thought process: normal  Thought content:   WNL  Sensory/Perceptual disturbances:   WNL  Orientation: oriented to person, place, and situation  Attention: Good  Concentration: Good  Memory: Impaired short term  Fund of knowledge:  Good  Insight:   Good  Judgment:  Good  Impulse Control: Good   Risk Assessment: Danger to Self:  No Self-injurious Behavior: No Danger to Others: No Duty to Warn:no Physical Aggression / Violence:No  Access to Firearms a concern: No  Gang Involvement:No  Treatment plan: Will employ cognitive behavioral therapy as well as grief and loss therapy for relief of anxiety and grief.  Goals of grief therapy or to have a healthier response to the loss of his daughter and  less sadness as evidenced by patient report in therapy notes as well as to help him feel less overwhelmed by her loss.  Goals are to see a 50% reduction in grief symptoms over the next 6 months.  I will encourage the use of the patient telling his grief story about his daughter including encouraging him to bring pictures and memorabilia to help process his feelings including any feelings of guilt associated with her loss.  We will use cognitive behavioral therapy to help identify and change anxiety producing thoughts and behavior patterns so as to improve his ability to better manage anxiety and stress, and manage thoughts and worrisome thinking contributing to anxiety.  We will also refresh and encourage dialectical behavior therapy distress tolerance and mindfulness skills with the intention of reducing anxiety by 50% over the next 6 months. Progress: 30% Interventions: Cognitive Behavioral Therapy  Diagnosis:PTSD, Bi-Polar D/O  Plan: F/U/ appointments scheduled weekly.  [name], [facility]                                                                                                                   [name],  [facility]  [facility] Counselor/Therapist Progress Note  Patient ID: Stephen Myers, MRN: [id number],    Date: 01/22/2024 This session was held via video teletherapy. The patient consented to the video teletherapy and was located in his home during this session. He is aware it is the responsibility of the patient to secure confidentiality on his end of the session. The provider was in a private home office for the duration of this session.    The patient arrived on time for her Caregility session  Time Spent: 58 minutes, 2 PM to 2:58 PM  Treatment Type: Individual Therapy Reported Symptoms: anxiety, panic attacks A fairly difficult week because it was his daughter's birthday.  He said this year was more  difficult than last year but he could not pinpoint why.  They did remember her about going out to a nice restaurant which is something her daughter always wanted to do.  He said they were able to laugh and joke about funny things that his daughter had said or done which is not something they were able to do this time last year so he saw that as healthy grieving.  He knows that the time change threw things off a little bit for everybody and his wife still is not feeling well and his back has been bothering him.  He was not able to go to his first round of physical therapy this week because his back was bothering him more.  He has been thinking a lot more about his daughter which he feels comfortable with.  He is thankful that it is getting warmer and staying light longer so he can get back to some of the coping skills such as fishing that he has done in the past.   We will continue to work on coping skills for helping with his anxiety and the progression of his Lewy body dementia diagnosis as well as processing his grief. Mental Status Exam: Appearance:  Well Groomed     Behavior: Appropriate  Motor: Pt. 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  Speech/Language:  Normal Rate  Affect: Appropriate  Mood: normal  Thought process: normal  Thought content:   WNL  Sensory/Perceptual disturbances:   WNL  Orientation: oriented to person, place, and situation  Attention: Good  Concentration: Good  Memory: Impaired short term  Fund of knowledge:  Good  Insight:   Good  Judgment:  Good  Impulse Control: Good   Risk Assessment: Danger to Self:  No Self-injurious Behavior: No Danger to Others: No Duty to Warn:no Physical Aggression / Violence:No  Access to Firearms a concern: No  Gang Involvement:No  Treatment plan: Will employ cognitive behavioral therapy as well as grief and loss therapy for relief of anxiety and grief.  Goals of grief therapy or to have a healthier response to the loss  of his daughter and less sadness as evidenced by patient report in therapy notes as well as to help him feel less overwhelmed by her loss.  Goals are to see a 50% reduction in grief symptoms over the next 6 months.  I will encourage the use of the patient telling his grief story about his daughter including encouraging him to bring pictures and memorabilia to help process his feelings including any feelings of guilt associated with her loss.  We will use cognitive behavioral therapy to help identify and change anxiety producing thoughts and behavior patterns so as to improve his ability to better manage anxiety and stress, and manage thoughts  and worrisome thinking contributing to anxiety.  We will also refresh and encourage dialectical behavior therapy distress tolerance and mindfulness skills with the intention of reducing anxiety by 50% over the next 6 months. Progress: 30% Interventions: Cognitive Behavioral Therapy  Diagnosis:PTSD, Bi-Polar D/O  Plan: F/U/ appointments scheduled weekly.  [name], [facility]                                                                                                                   [name], [facility]               [name], [facility]               [name],  [facility]  [facility] Counselor/Therapist Progress Note  Patient ID: Kayvan Hoefling, MRN: [id number],    Date: 01/22/2024 This session was held via video teletherapy. The patient consented to the video teletherapy and was located in his home during this session. He is aware it is the responsibility of the patient to secure confidentiality on his end of the session. The provider was in a private home office for the duration of this session.    The patient arrived on time for her Caregility session  Time Spent: 57 minutes, 2 PM to 2:57 PM  Treatment Type: Individual Therapy Reported Symptoms: anxiety, panic attacks The patient is still having significant back pain.  He cannot sit anywhere for very long and has to be  careful where he sits.  There is still some unsteadiness although he did not report any falls over the past 2 weeks.  His biggest concern now is for his wife.  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  She is meeting with her cardiologist again next week to see what possible treatment is for her.  He still thinks there may be some issue with her diaphragm but she is meeting with the pulmonologist also.  He is doing what he can to help her.  His daughter's birthday is March 11 so he knows that is a difficult time for both he and his wife next week.  Typically they do something to remember her but because of the wife's current health issues they cannot go to [place] as they had planned so they are talking about what to do to remember and celebrate her birthday.  We will continue to work on coping skills for helping with his anxiety and the progression of his Lewy body dementia diagnosis as well as processing his grief. Mental Status Exam: Appearance:  Well Groomed     Behavior: Appropriate  Motor: Pt. 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  Speech/Language:  Normal Rate  Affect: Appropriate  Mood: normal  Thought process: normal  Thought content:   WNL  Sensory/Perceptual disturbances:   WNL  Orientation: oriented to person, place, and situation  Attention: Good  Concentration: Good  Memory: Impaired short term  Fund of knowledge:  Good  Insight:   Good  Judgment:  Good  Impulse Control: Good   Risk Assessment: Danger to Self:  No Self-injurious Behavior: No Danger to Others: No Duty to Warn:no Physical Aggression / Violence:No  Access to Firearms a concern: No  Gang Involvement:No  Treatment plan: Will employ cognitive behavioral therapy as well as grief and loss therapy for relief of anxiety and grief.  Goals of grief therapy or to have a healthier response to the loss of his daughter  and less sadness as evidenced by patient report in therapy notes as well as to help him feel less overwhelmed by her loss.  Goals are to see a 50% reduction in grief symptoms over the next 6 months.  I will encourage the use of the patient telling his grief story about his daughter including encouraging him to bring pictures and memorabilia to help process his feelings including any feelings of guilt associated with her loss.  We will use cognitive behavioral therapy to help identify and change anxiety producing thoughts and behavior patterns so as to improve his ability to better manage anxiety and stress, and manage thoughts and worrisome thinking contributing  to anxiety.  We will also refresh and encourage dialectical behavior therapy distress tolerance and mindfulness skills with the intention of reducing anxiety by 50% over the next 6 months. Progress: 30% Interventions: Cognitive Behavioral Therapy  Diagnosis:PTSD, Bi-Polar D/O  Plan: F/U/ appointments scheduled weekly.  [name], [facility]                                                                                                                   [name],  [facility]  [facility] Counselor/Therapist Progress Note  Patient ID: Michial Disney, MRN: [id number],    Date: 01/22/2024 This session was held via video teletherapy. The patient consented to the video teletherapy and was located in his home during this session. He is aware it is the responsibility of the patient to secure confidentiality on his end of the session. The provider was in a private home office for the duration of this session.    The patient arrived on time for her Caregility session  Time Spent: 59 minutes, 2 PM to 2:59 PM  Treatment Type: Individual Therapy Reported Symptoms: anxiety, panic attacks The patient continues to present with physical pain and both his back and his knee.  He started  with a new physical therapist and told him that there was a lot of arthritis in both knees and or certain things he cannot do but they had him do that anyway and now for the past 3 days he has been in significant knee pain.  He has continued to walk because he knows that is good for him in various ways but says it has been painful.  His wife also was not feeling well but she is going back to the doctor next week to have an ablation and hopes that will bring her some relief.  For the most part he has been at the last couple weeks reaching back out and hearing from some old friends as well as time with his son and son's family.  He says that his wife's cousin is coming down for a weekend soon and they enjoy time with her and that he will go to his brother's house sometime in the next few weeks.  He recognizes the need for people that he cares about in helping with his depression. We will continue to work on coping skills for helping with his anxiety and the progression of his Lewy body dementia diagnosis as well as processing his grief. Mental Status Exam: Appearance:  Well Groomed     Behavior: Appropriate  Motor: Pt. 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  Speech/Language:  Normal Rate  Affect: Appropriate  Mood: normal  Thought process: normal  Thought content:   WNL  Sensory/Perceptual disturbances:   WNL  Orientation: oriented to person, place, and situation  Attention: Good  Concentration: Good  Memory: Impaired short term  Fund of knowledge:  Good  Insight:   Good  Judgment:  Good  Impulse Control: Good   Risk Assessment: Danger to Self:  No Self-injurious Behavior: No Danger to Others: No Duty to Warn:no Physical Aggression / Violence:No  Access to Firearms a concern: No  Gang Involvement:No  Treatment plan: Will employ cognitive behavioral therapy as well as grief and loss therapy for relief of anxiety and grief.  Goals of grief therapy or to have a healthier response  to the loss of his daughter and less sadness as evidenced by patient report in therapy notes as well as to help him feel less overwhelmed by her loss.  Goals are to see a 50% reduction in grief symptoms over the next 6 months.  I will encourage the use of the patient telling his grief story about his daughter including encouraging him to bring pictures and memorabilia to help process his feelings including any feelings of guilt associated with her loss.  We will use cognitive behavioral therapy to help identify and change anxiety producing thoughts and behavior patterns so as to improve his ability to better  manage anxiety and stress, and manage thoughts and worrisome thinking contributing to anxiety.  We will also refresh and encourage dialectical behavior therapy distress tolerance and mindfulness skills with the intention of reducing anxiety by 50% over the next 6 months. Progress: 30% Interventions: Cognitive Behavioral Therapy  Diagnosis:PTSD, Bi-Polar D/O  Plan: F/U/ appointments scheduled weekly.  [name], [facility]                                                                                                                   [name], [facility]               [name], [facility]               [name], [facility]               [name],  [facility]  [facility] Counselor/Therapist Progress Note  Patient ID: Stephen Myers, MRN: [id number],    Date: 01/22/2024 This session was held via video teletherapy. The patient consented to the video teletherapy and was located in his home during this session. He is aware it is the responsibility of the patient to secure confidentiality on his end of the session. The provider was in a private home office for the duration of this session.    The patient arrived on time for her Caregility session  Time Spent: 57 minutes, 2 PM to 2:57 PM  Treatment Type: Individual Therapy Reported Symptoms: anxiety, panic attacks The patient is still having significant back pain.  He cannot sit anywhere for very long and has to be  careful where he sits.  There is still some unsteadiness although he did not report any falls over the past 2 weeks.  His biggest concern now is for his wife.  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  She is meeting with her cardiologist again next week to see what possible treatment is for her.  He still thinks there may be some issue with her diaphragm but she is meeting with the pulmonologist also.  He is doing what he can to help her.  His daughter's birthday is March 11 so he knows that is a difficult time for both he and his wife next week.  Typically they do something to remember her but because of the wife's current health issues they cannot go to [place] as they had planned so they are talking about what to do to remember and celebrate her birthday.  We will continue to work on coping skills for helping with his anxiety and the progression of his Lewy body dementia diagnosis as well as processing his grief. Mental Status Exam: Appearance:  Well Groomed     Behavior: Appropriate  Motor: Pt. 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  Speech/Language:  Normal Rate  Affect: Appropriate  Mood: normal  Thought process: normal  Thought content:   WNL  Sensory/Perceptual disturbances:   WNL  Orientation: oriented to person, place, and situation  Attention: Good  Concentration: Good  Memory: Impaired short term  Fund of knowledge:  Good  Insight:   Good  Judgment:  Good  Impulse Control: Good   Risk Assessment: Danger to Self:  No Self-injurious Behavior: No Danger to Others: No Duty to Warn:no Physical Aggression / Violence:No  Access to Firearms a concern: No  Gang Involvement:No  Treatment plan: Will employ cognitive behavioral therapy as well as grief and loss therapy for relief of anxiety and grief.  Goals of grief therapy or to have a healthier response to the loss of his daughter  and less sadness as evidenced by patient report in therapy notes as well as to help him feel less overwhelmed by her loss.  Goals are to see a 50% reduction in grief symptoms over the next 6 months.  I will encourage the use of the patient telling his grief story about his daughter including encouraging him to bring pictures and memorabilia to help process his feelings including any feelings of guilt associated with her loss.  We will use cognitive behavioral therapy to help identify and change anxiety producing thoughts and behavior patterns so as to improve his ability to better manage anxiety and stress, and manage thoughts and worrisome thinking contributing  to anxiety.  We will also refresh and encourage dialectical behavior therapy distress tolerance and mindfulness skills with the intention of reducing anxiety by 50% over the next 6 months. Progress: 30% Interventions: Cognitive Behavioral Therapy  Diagnosis:PTSD, Bi-Polar D/O  Plan: F/U/ appointments scheduled weekly.  [name], [facility]                                                                                                                   [name],  [facility]  [facility] Counselor/Therapist Progress Note  Patient ID: Stephen Myers, MRN: [id number],    Date: 01/22/2024 This session was held via video teletherapy. The patient consented to the video teletherapy and was located in his home during this session. He is aware it is the responsibility of the patient to secure confidentiality on his end of the session. The provider was in a private home office for the duration of this session.    The patient arrived on time for her Caregility session  Time Spent: 58 minutes, 2 PM to 2:58 PM  Treatment Type: Individual Therapy Reported Symptoms: anxiety, panic attacks A fairly difficult week because it was his daughter's birthday.  He said this year was more  difficult than last year but he could not pinpoint why.  They did remember her about going out to a nice restaurant which is something her daughter always wanted to do.  He said they were able to laugh and joke about funny things that his daughter had said or done which is not something they were able to do this time last year so he saw that as healthy grieving.  He knows that the time change threw things off a little bit for everybody and his wife still is not feeling well and his back has been bothering him.  He was not able to go to his first round of physical therapy this week because his back was bothering him more.  He has been thinking a lot more about his daughter which he feels comfortable with.  He is thankful that it is getting warmer and staying light longer so he can get back to some of the coping skills such as fishing that he has done in the past.   We will continue to work on coping skills for helping with his anxiety and the progression of his Lewy body dementia diagnosis as well as processing his grief. Mental Status Exam: Appearance:  Well Groomed     Behavior: Appropriate  Motor: Pt. 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  Speech/Language:  Normal Rate  Affect: Appropriate  Mood: normal  Thought process: normal  Thought content:   WNL  Sensory/Perceptual disturbances:   WNL  Orientation: oriented to person, place, and situation  Attention: Good  Concentration: Good  Memory: Impaired short term  Fund of knowledge:  Good  Insight:   Good  Judgment:  Good  Impulse Control: Good   Risk Assessment: Danger to Self:  No Self-injurious Behavior: No Danger to Others: No Duty to Warn:no Physical Aggression / Violence:No  Access to Firearms a concern: No  Gang Involvement:No  Treatment plan: Will employ cognitive behavioral therapy as well as grief and loss therapy for relief of anxiety and grief.  Goals of grief therapy or to have a healthier response to the loss  of his daughter and less sadness as evidenced by patient report in therapy notes as well as to help him feel less overwhelmed by her loss.  Goals are to see a 50% reduction in grief symptoms over the next 6 months.  I will encourage the use of the patient telling his grief story about his daughter including encouraging him to bring pictures and memorabilia to help process his feelings including any feelings of guilt associated with her loss.  We will use cognitive behavioral therapy to help identify and change anxiety producing thoughts and behavior patterns so as to improve his ability to better manage anxiety and stress, and manage thoughts  and worrisome thinking contributing to anxiety.  We will also refresh and encourage dialectical behavior therapy distress tolerance and mindfulness skills with the intention of reducing anxiety by 50% over the next 6 months. Progress: 30% Interventions: Cognitive Behavioral Therapy  Diagnosis:PTSD, Bi-Polar D/O  Plan: F/U/ appointments scheduled weekly.  [name], [facility]                                                                                                                   [name], [facility]               [name], [facility]               [name],  [facility]  [facility] Counselor/Therapist Progress Note  Patient ID: Stephen Myers, MRN: [id number],    Date: 01/22/2024 This session was held via video teletherapy. The patient consented to the video teletherapy and was located in his home during this session. He is aware it is the responsibility of the patient to secure confidentiality on his end of the session. The provider was in a private home office for the duration of this session.    The patient arrived on time for her Caregility session  Time Spent: 57 minutes, 2 PM to 2:57 PM  Treatment Type: Individual Therapy Reported Symptoms: anxiety, panic attacks The patient is still having significant back pain.  He cannot sit anywhere for very long and has to be  careful where he sits.  There is still some unsteadiness although he did not report any falls over the past 2 weeks.  His biggest concern now is for his wife.  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  She is meeting with her cardiologist again next week to see what possible treatment is for her.  He still thinks there may be some issue with her diaphragm but she is meeting with the pulmonologist also.  He is doing what he can to help her.  His daughter's birthday is March 11 so he knows that is a difficult time for both he and his wife next week.  Typically they do something to remember her but because of the wife's current health issues they cannot go to [place] as they had planned so they are talking about what to do to remember and celebrate her birthday.  We will continue to work on coping skills for helping with his anxiety and the progression of his Lewy body dementia diagnosis as well as processing his grief. Mental Status Exam: Appearance:  Well Groomed     Behavior: Appropriate  Motor: Pt. Reports some instability due to Lewey Body dementia  Speech/Language:  Normal Rate  Affect: Appropriate  Mood: normal  Thought process: normal  Thought content:   WNL  Sensory/Perceptual disturbances:   WNL  Orientation: oriented to person, place, and situation  Attention: Good  Concentration: Good  Memory: Impaired short term  Fund of knowledge:  Good  Insight:   Good  Judgment:  Good  Impulse Control: Good   Risk Assessment: Danger to Self:  No Self-injurious Behavior: No Danger to Others: No Duty to Warn:no Physical Aggression / Violence:No  Access to Firearms a concern: No  Gang Involvement:No  Treatment plan: Will employ cognitive behavioral therapy as well as grief and loss therapy for relief of anxiety and grief.  Goals of grief therapy or to have a healthier response to the loss of his daughter  and less sadness as evidenced by patient report in therapy notes as well as to help him feel less overwhelmed by her loss.  Goals are to see a 50% reduction in grief symptoms over the next 6 months.  I will encourage the use of the patient telling his grief story about his daughter including encouraging him to bring pictures and memorabilia to help process his feelings including any feelings of guilt associated with her loss.  We will use cognitive behavioral therapy to help identify and change anxiety producing thoughts and behavior patterns so as to improve his ability to better manage anxiety and stress, and manage thoughts and worrisome thinking contributing  to anxiety.  We will also refresh and encourage dialectical behavior therapy distress tolerance and mindfulness skills with the intention of reducing anxiety by 50% over the next 6 months. Progress: 30% Interventions: Cognitive Behavioral Therapy  Diagnosis:PTSD, Bi-Polar D/O  Plan: F/U/ appointments scheduled weekly.  [name], [facility]                                                                                                                   [name],  [facility]  [facility] Counselor/Therapist Progress Note  Patient ID: Stephen Myers, MRN: [id number],    Date: 01/22/2024 This session was held via video teletherapy. The patient consented to the video teletherapy and was located in his home during this session. He is aware it is the responsibility of the patient to secure confidentiality on his end of the session. The provider was in a private home office for the duration of this session.    The patient arrived on time for her Caregility session  Time Spent: 57 minutes, 2 PM to 2:57 PM  Treatment Type: Individual Therapy Reported Symptoms: anxiety, panic attacks  We will continue to work on coping skills for helping with his anxiety and the progression of  his Lewy body dementia diagnosis as well as processing his grief. Mental Status Exam: Appearance:  Well Groomed     Behavior: Appropriate  Motor: Pt. Reports some instability due to Lewey Body dementia  Speech/Language:  Normal Rate  Affect: Appropriate  Mood: normal  Thought process: normal  Thought content:   WNL  Sensory/Perceptual disturbances:   WNL  Orientation: oriented to person, place, and situation  Attention: Good  Concentration: Good  Memory: Impaired short term  Fund of knowledge:  Good  Insight:   Good  Judgment:  Good  Impulse Control: Good   Risk Assessment: Danger to Self:  No Self-injurious Behavior: No Danger to Others: No Duty to Warn:no Physical Aggression / Violence:No  Access to Firearms a concern: No  Gang Involvement:No  Treatment plan: Will employ cognitive behavioral therapy as well as grief and loss therapy for relief of anxiety and grief.  Goals of grief therapy or to have a healthier response to the loss of his daughter and less sadness as evidenced by patient report in therapy notes as well as to help him feel less overwhelmed by her loss.  Goals are to see a 50% reduction in grief symptoms over the next 6 months.  I will encourage the use of the patient telling his grief story about his daughter including encouraging him to bring pictures and memorabilia to help process his feelings including any feelings of guilt associated with her loss.  We will use cognitive behavioral therapy to help identify and change anxiety producing thoughts and behavior patterns so as to improve his ability to better manage anxiety and stress, and manage thoughts and worrisome thinking contributing to anxiety.  We will also refresh and encourage dialectical behavior therapy distress tolerance and mindfulness skills with the intention of reducing anxiety by 50% over the next 6 months. Progress: 30% Interventions: Cognitive Behavioral Therapy  Diagnosis:PTSD, Bi-Polar  D/O  Plan: F/U/ appointments scheduled weekly.  [name], [facility]                                                                                                                   [name], [facility]               [name], [facility]  [name], [facility]               [name], [facility]               [name], [facility]

## 2024-01-29 ENCOUNTER — Ambulatory Visit: Payer: Medicare Other | Admitting: Behavioral Health

## 2024-01-29 DIAGNOSIS — F3181 Bipolar II disorder: Secondary | ICD-10-CM

## 2024-01-29 DIAGNOSIS — F4310 Post-traumatic stress disorder, unspecified: Secondary | ICD-10-CM | POA: Diagnosis not present

## 2024-01-29 DIAGNOSIS — F4321 Adjustment disorder with depressed mood: Secondary | ICD-10-CM

## 2024-01-29 NOTE — Progress Notes (Signed)
 [facility] Counselor/Therapist Progress Note  Patient ID: Stephen Myers, MRN: [id number],    Date: 02/11/2024 This session was held via video teletherapy. The patient consented to the video teletherapy and was located in his home during this session. He is aware it is the responsibility of the patient to secure confidentiality on his end of the session. The provider was in a private home office for the duration of this session.    The patient arrived on time for her Caregility session  Time Spent: 57 minutes, 2 PM to 2:57 PM  Treatment Type: Individual Therapy Reported Symptoms: anxiety, panic attacks The patient is still having significant back pain.  He cannot sit anywhere for very long and has to be careful  where he sits.  There is still some unsteadiness although he did not report any falls over the past 2 weeks.  His biggest concern now is for his wife.  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  She is meeting with her cardiologist again next week to see what possible treatment is for her.  He still thinks there may be some issue with her diaphragm but she is meeting with the pulmonologist also.  He is doing what he can to help her.  His daughter's birthday is March 11 so he knows that is a difficult time for both he and his wife next week.  Typically they do something to remember her but because of the wife's current health issues they cannot go to [place] as they had planned so they are talking about what to do to remember and celebrate her birthday.  We will continue to work on coping skills for helping with his anxiety and the progression of his Lewy body dementia diagnosis as well as processing his grief. Mental Status Exam: Appearance:  Well Groomed     Behavior: Appropriate  Motor: Pt. Reports some instability due to Lewey Body dementia  Speech/Language:  Normal Rate  Affect: Appropriate  Mood: normal  Thought process: normal  Thought content:   WNL  Sensory/Perceptual disturbances:   WNL  Orientation: oriented to person, place, and situation  Attention: Good  Concentration: Good  Memory: Impaired short term  Fund of knowledge:  Good  Insight:   Good  Judgment:  Good  Impulse Control: Good   Risk Assessment: Danger to Self:  No Self-injurious Behavior: No Danger to Others: No Duty to Warn:no Physical Aggression / Violence:No  Access to Firearms a concern: No  Gang Involvement:No  Treatment plan: Will employ cognitive behavioral therapy as well as grief and loss therapy for relief of anxiety and grief.  Goals of grief therapy or to have a healthier response to the loss of his daughter and  less sadness as evidenced by patient report in therapy notes as well as to help him feel less overwhelmed by her loss.  Goals are to see a 50% reduction in grief symptoms over the next 6 months.  I will encourage the use of the patient telling his grief story about his daughter including encouraging him to bring pictures and memorabilia to help process his feelings including any feelings of guilt associated with her loss.  We will use cognitive behavioral therapy to help identify and change anxiety producing thoughts and behavior patterns so as to improve his ability to better manage anxiety and stress, and manage thoughts and worrisome thinking contributing to anxiety.  We will also refresh and encourage dialectical behavior therapy distress tolerance and mindfulness skills with the intention of reducing anxiety by 50% over the next 6 months. Progress: 30% Interventions: Cognitive Behavioral Therapy  Diagnosis:PTSD, Bi-Polar D/O  Plan: F/U/ appointments scheduled weekly.  [name], [facility]                                                                                                                   [name],  [facility]  [facility] Counselor/Therapist Progress Note  Patient ID: Stephen Myers, MRN: [id number],    Date: 02/11/2024 This session was held via video teletherapy. The patient consented to the video teletherapy and was located in his home during this session. He is aware it is the responsibility of the patient to secure confidentiality on his end of the session. The provider was in a private home office for the duration of this session.    The patient arrived on time for her Caregility session  Time Spent: 58 minutes, 2 PM to 2:58 PM  Treatment Type: Individual Therapy Reported Symptoms: anxiety, panic attacks A fairly difficult week because it was his daughter's birthday.  He said this year was more  difficult than last year but he could not pinpoint why.  They did remember her about going out to a nice restaurant which is something her daughter always wanted to do.  He said they were able to laugh and joke about funny things that his daughter had said or done which is not something they were able to do this time last year so he saw that as healthy grieving.  He knows that the time change threw things off a little bit for everybody and his wife still is not feeling well and his back has been bothering him.  He was not able to go to his first round of physical therapy this week because his back was bothering him more.  He has been thinking a lot more about his daughter which he feels comfortable with.  He is thankful that it is getting warmer and staying light longer so he can get back to some of the coping skills such as fishing that he has done in the past.   We will continue to work on coping skills for helping with his anxiety and the progression of his Lewy body dementia diagnosis as well as processing his grief. Mental Status Exam: Appearance:  Well Groomed     Behavior: Appropriate  Motor: Pt. 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  Speech/Language:  Normal Rate  Affect: Appropriate  Mood: normal  Thought process: normal  Thought content:   WNL  Sensory/Perceptual disturbances:   WNL  Orientation: oriented to person, place, and situation  Attention: Good  Concentration: Good  Memory: Impaired short term  Fund of knowledge:  Good  Insight:   Good  Judgment:  Good  Impulse Control: Good   Risk Assessment: Danger to Self:  No Self-injurious Behavior: No Danger to Others: No Duty to Warn:no Physical Aggression / Violence:No  Access to Firearms a concern: No  Gang Involvement:No  Treatment plan: Will employ cognitive behavioral therapy as well as grief and loss therapy for relief of anxiety and grief.  Goals of grief therapy or to have a healthier response to the loss  of his daughter and less sadness as evidenced by patient report in therapy notes as well as to help him feel less overwhelmed by her loss.  Goals are to see a 50% reduction in grief symptoms over the next 6 months.  I will encourage the use of the patient telling his grief story about his daughter including encouraging him to bring pictures and memorabilia to help process his feelings including any feelings of guilt associated with her loss.  We will use cognitive behavioral therapy to help identify and change anxiety producing thoughts and behavior patterns so as to improve his ability to better manage anxiety and stress, and manage thoughts  and worrisome thinking contributing to anxiety.  We will also refresh and encourage dialectical behavior therapy distress tolerance and mindfulness skills with the intention of reducing anxiety by 50% over the next 6 months. Progress: 30% Interventions: Cognitive Behavioral Therapy  Diagnosis:PTSD, Bi-Polar D/O  Plan: F/U/ appointments scheduled weekly.  [name], [facility]                                                                                                                   [name], [facility]               [name], [facility]               [name],  [facility]  [facility] Counselor/Therapist Progress Note  Patient ID: Stephen Myers, MRN: [id number],    Date: 02/11/2024 This session was held via video teletherapy. The patient consented to the video teletherapy and was located in his home during this session. He is aware it is the responsibility of the patient to secure confidentiality on his end of the session. The provider was in a private home office for the duration of this session.    The patient arrived on time for her Caregility session  Time Spent: 57 minutes, 2 PM to 2:57 PM  Treatment Type: Individual Therapy Reported Symptoms: anxiety, panic attacks The patient is still having significant back pain.  He cannot sit anywhere for very long and has to be  careful where he sits.  There is still some unsteadiness although he did not report any falls over the past 2 weeks.  His biggest concern now is for his wife.  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  She is meeting with her cardiologist again next week to see what possible treatment is for her.  He still thinks there may be some issue with her diaphragm but she is meeting with the pulmonologist also.  He is doing what he can to help her.  His daughter's birthday is March 11 so he knows that is a difficult time for both he and his wife next week.  Typically they do something to remember her but because of the wife's current health issues they cannot go to [place] as they had planned so they are talking about what to do to remember and celebrate her birthday.  We will continue to work on coping skills for helping with his anxiety and the progression of his Lewy body dementia diagnosis as well as processing his grief. Mental Status Exam: Appearance:  Well Groomed     Behavior: Appropriate  Motor: Pt. 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  Speech/Language:  Normal Rate  Affect: Appropriate  Mood: normal  Thought process: normal  Thought content:   WNL  Sensory/Perceptual disturbances:   WNL  Orientation: oriented to person, place, and situation  Attention: Good  Concentration: Good  Memory: Impaired short term  Fund of knowledge:  Good  Insight:   Good  Judgment:  Good  Impulse Control: Good   Risk Assessment: Danger to Self:  No Self-injurious Behavior: No Danger to Others: No Duty to Warn:no Physical Aggression / Violence:No  Access to Firearms a concern: No  Gang Involvement:No  Treatment plan: Will employ cognitive behavioral therapy as well as grief and loss therapy for relief of anxiety and grief.  Goals of grief therapy or to have a healthier response to the loss of his daughter  and less sadness as evidenced by patient report in therapy notes as well as to help him feel less overwhelmed by her loss.  Goals are to see a 50% reduction in grief symptoms over the next 6 months.  I will encourage the use of the patient telling his grief story about his daughter including encouraging him to bring pictures and memorabilia to help process his feelings including any feelings of guilt associated with her loss.  We will use cognitive behavioral therapy to help identify and change anxiety producing thoughts and behavior patterns so as to improve his ability to better manage anxiety and stress, and manage thoughts and worrisome thinking contributing  to anxiety.  We will also refresh and encourage dialectical behavior therapy distress tolerance and mindfulness skills with the intention of reducing anxiety by 50% over the next 6 months. Progress: 30% Interventions: Cognitive Behavioral Therapy  Diagnosis:PTSD, Bi-Polar D/O  Plan: F/U/ appointments scheduled weekly.  [name], [facility]                                                                                                                   [name],  [facility]  [facility] Counselor/Therapist Progress Note  Patient ID: Stephen Myers, MRN: [id number],    Date: 02/11/2024 This session was held via video teletherapy. The patient consented to the video teletherapy and was located in his home during this session. He is aware it is the responsibility of the patient to secure confidentiality on his end of the session. The provider was in a private home office for the duration of this session.    The patient arrived on time for her Caregility session  Time Spent: 59 minutes, 2 PM to 2:59 PM  Treatment Type: Individual Therapy Reported Symptoms: anxiety, panic attacks The patient continues to present with physical pain and both his back and his knee.  He started  with a new physical therapist and told him that there was a lot of arthritis in both knees and or certain things he cannot do but they had him do that anyway and now for the past 3 days he has been in significant knee pain.  He has continued to walk because he knows that is good for him in various ways but says it has been painful.  His wife also was not feeling well but she is going back to the doctor next week to have an ablation and hopes that will bring her some relief.  For the most part he has been at the last couple weeks reaching back out and hearing from some old friends as well as time with his son and son's family.  He says that his wife's cousin is coming down for a weekend soon and they enjoy time with her and that he will go to his brother's house sometime in the next few weeks.  He recognizes the need for people that he cares about in helping with his depression. We will continue to work on coping skills for helping with his anxiety and the progression of his Lewy body dementia diagnosis as well as processing his grief. Mental Status Exam: Appearance:  Well Groomed     Behavior: Appropriate  Motor: Pt. 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  Speech/Language:  Normal Rate  Affect: Appropriate  Mood: normal  Thought process: normal  Thought content:   WNL  Sensory/Perceptual disturbances:   WNL  Orientation: oriented to person, place, and situation  Attention: Good  Concentration: Good  Memory: Impaired short term  Fund of knowledge:  Good  Insight:   Good  Judgment:  Good  Impulse Control: Good   Risk Assessment: Danger to Self:  No Self-injurious Behavior: No Danger to Others: No Duty to Warn:no Physical Aggression / Violence:No  Access to Firearms a concern: No  Gang Involvement:No  Treatment plan: Will employ cognitive behavioral therapy as well as grief and loss therapy for relief of anxiety and grief.  Goals of grief therapy or to have a healthier response  to the loss of his daughter and less sadness as evidenced by patient report in therapy notes as well as to help him feel less overwhelmed by her loss.  Goals are to see a 50% reduction in grief symptoms over the next 6 months.  I will encourage the use of the patient telling his grief story about his daughter including encouraging him to bring pictures and memorabilia to help process his feelings including any feelings of guilt associated with her loss.  We will use cognitive behavioral therapy to help identify and change anxiety producing thoughts and behavior patterns so as to improve his ability to better  manage anxiety and stress, and manage thoughts and worrisome thinking contributing to anxiety.  We will also refresh and encourage dialectical behavior therapy distress tolerance and mindfulness skills with the intention of reducing anxiety by 50% over the next 6 months. Progress: 30% Interventions: Cognitive Behavioral Therapy  Diagnosis:PTSD, Bi-Polar D/O  Plan: F/U/ appointments scheduled weekly.  [name], [facility]                                                                                                                   [name], [facility]               [name], [facility]               [name], [facility]               [name],  [facility]  [facility] Counselor/Therapist Progress Note  Patient ID: Stephen Myers, MRN: [id number],    Date: 02/11/2024 This session was held via video teletherapy. The patient consented to the video teletherapy and was located in his home during this session. He is aware it is the responsibility of the patient to secure confidentiality on his end of the session. The provider was in a private home office for the duration of this session.    The patient arrived on time for her Caregility session  Time Spent: 57 minutes, 2 PM to 2:57 PM  Treatment Type: Individual Therapy Reported Symptoms: anxiety, panic attacks The patient is still having significant back pain.  He cannot sit anywhere for very long and has to be  careful where he sits.  There is still some unsteadiness although he did not report any falls over the past 2 weeks.  His biggest concern now is for his wife.  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  She is meeting with her cardiologist again next week to see what possible treatment is for her.  He still thinks there may be some issue with her diaphragm but she is meeting with the pulmonologist also.  He is doing what he can to help her.  His daughter's birthday is March 11 so he knows that is a difficult time for both he and his wife next week.  Typically they do something to remember her but because of the wife's current health issues they cannot go to [place] as they had planned so they are talking about what to do to remember and celebrate her birthday.  We will continue to work on coping skills for helping with his anxiety and the progression of his Lewy body dementia diagnosis as well as processing his grief. Mental Status Exam: Appearance:  Well Groomed     Behavior: Appropriate  Motor: Pt. 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  Speech/Language:  Normal Rate  Affect: Appropriate  Mood: normal  Thought process: normal  Thought content:   WNL  Sensory/Perceptual disturbances:   WNL  Orientation: oriented to person, place, and situation  Attention: Good  Concentration: Good  Memory: Impaired short term  Fund of knowledge:  Good  Insight:   Good  Judgment:  Good  Impulse Control: Good   Risk Assessment: Danger to Self:  No Self-injurious Behavior: No Danger to Others: No Duty to Warn:no Physical Aggression / Violence:No  Access to Firearms a concern: No  Gang Involvement:No  Treatment plan: Will employ cognitive behavioral therapy as well as grief and loss therapy for relief of anxiety and grief.  Goals of grief therapy or to have a healthier response to the loss of his daughter  and less sadness as evidenced by patient report in therapy notes as well as to help him feel less overwhelmed by her loss.  Goals are to see a 50% reduction in grief symptoms over the next 6 months.  I will encourage the use of the patient telling his grief story about his daughter including encouraging him to bring pictures and memorabilia to help process his feelings including any feelings of guilt associated with her loss.  We will use cognitive behavioral therapy to help identify and change anxiety producing thoughts and behavior patterns so as to improve his ability to better manage anxiety and stress, and manage thoughts and worrisome thinking contributing  to anxiety.  We will also refresh and encourage dialectical behavior therapy distress tolerance and mindfulness skills with the intention of reducing anxiety by 50% over the next 6 months. Progress: 30% Interventions: Cognitive Behavioral Therapy  Diagnosis:PTSD, Bi-Polar D/O  Plan: F/U/ appointments scheduled weekly.  [name], [facility]                                                                                                                   [name],  [facility]   [facility] Counselor/Therapist Progress Note  Patient ID: Stephen Myers, MRN: [id number],    Date: 02/11/2024 This session was held via video teletherapy. The patient consented to the video teletherapy and was located in his home during this session. He is aware it is the responsibility of the patient to secure confidentiality on his end of the session. The provider was in a private home office for the duration of this session.    The patient arrived on time for her Caregility session  Time Spent: 58 minutes, 2 PM to 2:58 PM  Treatment Type: Individual Therapy Reported Symptoms: anxiety, panic attacks A fairly difficult week because it was his daughter's birthday.  He said this year was more  difficult than last year but he could not pinpoint why.  They did remember her about going out to a nice restaurant which is something her daughter always wanted to do.  He said they were able to laugh and joke about funny things that his daughter had said or done which is not something they were able to do this time last year so he saw that as healthy grieving.  He knows that the time change threw things off a little bit for everybody and his wife still is not feeling well and his back has been bothering him.  He was not able to go to his first round of physical therapy this week because his back was bothering him more.  He has been thinking a lot more about his daughter which he feels comfortable with.  He is thankful that it is getting warmer and staying light longer so he can get back to some of the coping skills such as fishing that he has done in the past.   We will continue to work on coping skills for helping with his anxiety and the progression of his Lewy body dementia diagnosis as well as processing his grief. Mental Status Exam: Appearance:  Well Groomed     Behavior: Appropriate  Motor: Pt. 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  Speech/Language:  Normal Rate  Affect: Appropriate  Mood: normal  Thought process: normal  Thought content:   WNL  Sensory/Perceptual disturbances:   WNL  Orientation: oriented to person, place, and situation  Attention: Good  Concentration: Good  Memory: Impaired short term  Fund of knowledge:  Good  Insight:   Good  Judgment:  Good  Impulse Control: Good   Risk Assessment: Danger to Self:  No Self-injurious Behavior: No Danger to Others: No Duty to Warn:no Physical Aggression / Violence:No  Access to Firearms a concern: No  Gang Involvement:No  Treatment plan: Will employ cognitive behavioral therapy as well as grief and loss therapy for relief of anxiety and grief.  Goals of grief therapy or to have a healthier response to the loss  of his daughter and less sadness as evidenced by patient report in therapy notes as well as to help him feel less overwhelmed by her loss.  Goals are to see a 50% reduction in grief symptoms over the next 6 months.  I will encourage the use of the patient telling his grief story about his daughter including encouraging him to bring pictures and memorabilia to help process his feelings including any feelings of guilt associated with her loss.  We will use cognitive behavioral therapy to help identify and change anxiety producing thoughts and behavior patterns so as to improve his ability to better manage anxiety and stress, and manage thoughts  and worrisome thinking contributing to anxiety.  We will also refresh and encourage dialectical behavior therapy distress tolerance and mindfulness skills with the intention of reducing anxiety by 50% over the next 6 months. Progress: 30% Interventions: Cognitive Behavioral Therapy  Diagnosis:PTSD, Bi-Polar D/O  Plan: F/U/ appointments scheduled weekly.  [name], [facility]                                                                                                                   [name], [facility]               [name], [facility]               [name],  [facility]  [place] [facility] Counselor/Therapist Progress Note  Patient ID: Stephen Myers, MRN: [id number],    Date: 02/11/2024 This session was held via video teletherapy. The patient consented to the video teletherapy and was located in his home during this session. He is aware it is the responsibility of the patient to secure confidentiality on his end of the session. The provider was in a private home office for the duration of this session.    The patient arrived on time for her Caregility session  Time Spent: 57 minutes, 2 PM to 2:57 PM  Treatment Type: Individual Therapy Reported Symptoms: anxiety, panic attacks The patient is still having significant back pain.  He cannot sit anywhere for very long and has to be  careful where he sits.  There is still some unsteadiness although he did not report any falls over the past 2 weeks.  His biggest concern now is for his wife.  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  She is meeting with her cardiologist again next week to see what possible treatment is for her.  He still thinks there may be some issue with her diaphragm but she is meeting with the pulmonologist also.  He is doing what he can to help her.  His daughter's birthday is March 11 so he knows that is a difficult time for both he and his wife next week.  Typically they do something to remember her but because of the wife's current health issues they cannot go to [place] as they had planned so they are talking about what to do to remember and celebrate her birthday.  We will continue to work on coping skills for helping with his anxiety and the progression of his Lewy body dementia diagnosis as well as processing his grief. Mental Status Exam: Appearance:  Well Groomed     Behavior: Appropriate  Motor: Pt. Reports some instability due to Lewey Body dementia  Speech/Language:  Normal Rate  Affect: Appropriate  Mood: normal  Thought process: normal  Thought content:   WNL  Sensory/Perceptual disturbances:   WNL  Orientation: oriented to person, place, and situation  Attention: Good  Concentration: Good  Memory: Impaired short term  Fund of knowledge:  Good  Insight:   Good  Judgment:  Good  Impulse Control: Good   Risk Assessment: Danger to Self:  No Self-injurious Behavior: No Danger to Others: No Duty to Warn:no Physical Aggression / Violence:No  Access to Firearms a concern: No  Gang Involvement:No  Treatment plan: Will employ cognitive behavioral therapy as well as grief and loss therapy for relief of anxiety and grief.  Goals of grief therapy or to have a healthier response to the loss of his daughter  and less sadness as evidenced by patient report in therapy notes as well as to help him feel less overwhelmed by her loss.  Goals are to see a 50% reduction in grief symptoms over the next 6 months.  I will encourage the use of the patient telling his grief story about his daughter including encouraging him to bring pictures and memorabilia to help process his feelings including any feelings of guilt associated with her loss.  We will use cognitive behavioral therapy to help identify and change anxiety producing thoughts and behavior patterns so as to improve his ability to better manage anxiety and stress, and manage thoughts and worrisome thinking contributing  to anxiety.  We will also refresh and encourage dialectical behavior therapy distress tolerance and mindfulness skills with the intention of reducing anxiety by 50% over the next 6 months. Progress: 30% Interventions: Cognitive Behavioral Therapy  Diagnosis:PTSD, Bi-Polar D/O  Plan: F/U/ appointments scheduled weekly.  [name], [facility]                                                                                                                   [name],  [facility]   Behavioral Health Counselor/Therapist Progress Note  Patient ID: Stephen Myers, MRN: [id number],    Date: 01/29/24 This session was held via video teletherapy. The patient consented to the video teletherapy and was located in his home during this session. He is aware it is the responsibility of the patient to secure confidentiality on his end of the session. The provider was in a private home office for the duration of this session.    The patient arrived on time for her Caregility session  Time Spent: 57 minutes, 2 PM to 2:57 PM  Treatment Type: Individual Therapy Reported Symptoms: anxiety, panic attacks The patient expresses some anxiety about the medical issues with his wife.  They are still trying to  figure it out but they think they have narrowed it down to her diaphragm trying to decide what is the best treatment for her.  Physically he is feeling a little bit better but is not going to go back to physical therapy because the last couple of times have not been beneficial and he feels he was not heard.  He recognizes still be some progression in his balance gait etc. with the Lewy body dementia diagnosis but is staying proactive walking every morning for the most part as long as he feels well.  There are still some issues where he gets a little off balance and falls backwards but thankfully so far he has not been hurt very much and in those episodes.  He still feels that he is continuing to grieve his daughter's death very well as they were approaching the second anniversary of her death in a couple of months.  He still is journaling and saying things to her and the journaling that he would like to be able to say.  He is comforted by time with his wife and his brothers's  and his son's family. We will continue to work on coping skills for helping with his anxiety and the progression of his Lewy body dementia diagnosis as well as processing his grief. Mental Status Exam: Appearance:  Well Groomed     Behavior: Appropriate  Motor: Pt. Reports some instability due to Lewey Body dementia  Speech/Language:  Normal Rate  Affect: Appropriate  Mood: normal  Thought process: normal  Thought content:   WNL  Sensory/Perceptual disturbances:   WNL  Orientation: oriented to person, place, and situation  Attention: Good  Concentration: Good  Memory: Impaired short term  Fund of knowledge:  Good  Insight:   Good  Judgment:  Good  Impulse Control: Good   Risk Assessment: Danger to Self:  No Self-injurious Behavior: No Danger to Others: No Duty to Warn:no Physical Aggression / Violence:No  Access to Firearms a concern: No  Gang Involvement:No  Treatment plan: Will employ cognitive behavioral therapy  as well as grief and loss therapy for relief of anxiety and grief.  Goals of grief therapy or to have a healthier response to the loss of his daughter and less sadness as evidenced by patient report in therapy notes as well as to help him feel less overwhelmed by her loss.  Goals are to see a 50% reduction in grief symptoms over the next 6 months.  I will encourage the use of the patient telling his grief story about his daughter including encouraging him to bring pictures and memorabilia to help process his feelings including any feelings of guilt associated with her loss.  We will use cognitive behavioral therapy to help identify and change anxiety  producing thoughts and behavior patterns so as to improve his ability to better manage anxiety and stress, and manage thoughts and worrisome thinking contributing to anxiety.  We will also refresh and encourage dialectical behavior therapy distress tolerance and mindfulness skills with the intention of reducing anxiety by 50% over the next 6 months. Progress: 30% Interventions: Cognitive Behavioral Therapy  Diagnosis:PTSD, Bi-Polar D/O  Plan: F/U/ appointments scheduled weekly.  [name], [facility]                                                                                                                   [name], [facility]               [name], [facility]               [name], [facility]               [name], [facility]               [name],  [facility]  [place] [facility] Counselor/Therapist Progress Note  Patient ID: Stephen Myers, MRN: [id number],    Date: 02/11/2024 This session was held via video teletherapy. The patient consented to the video teletherapy and was located in his home during this session. He is aware it is the responsibility of the patient to secure confidentiality on his end of the session. The provider was in a private home office for the duration of this session.    The patient arrived on time for her Caregility session  Time Spent: 57 minutes, 2 PM to 2:57 PM  Treatment Type: Individual Therapy Reported Symptoms: anxiety, panic attacks The patient is still having significant back pain.  He cannot sit anywhere for very long and has to be  careful where he sits.  There is still some unsteadiness although he did not report any falls over the past 2 weeks.  His biggest concern now is for his wife.  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  She is meeting with her cardiologist again next week to see what possible treatment is for her.  He still thinks there may be some issue with her diaphragm but she is meeting with the pulmonologist also.  He is doing what he can to help her.  His daughter's birthday is March 11 so he knows that is a difficult time for both he and his wife next week.  Typically they do something to remember her but because of the wife's current health issues they cannot go to [place] as they had planned so they are talking about what to do to remember and celebrate her birthday.  We will continue to work on coping skills for helping with his anxiety and the progression of his Lewy body dementia diagnosis as well as processing his grief. Mental Status Exam: Appearance:  Well Groomed     Behavior: Appropriate  Motor: Pt. Reports some instability due to Lewey Body dementia  Speech/Language:  Normal Rate  Affect: Appropriate  Mood: normal  Thought process: normal  Thought content:   WNL  Sensory/Perceptual disturbances:   WNL  Orientation: oriented to person, place, and situation  Attention: Good  Concentration: Good  Memory: Impaired short term  Fund of knowledge:  Good  Insight:   Good  Judgment:  Good  Impulse Control: Good   Risk Assessment: Danger to Self:  No Self-injurious Behavior: No Danger to Others: No Duty to Warn:no Physical Aggression / Violence:No  Access to Firearms a concern: No  Gang Involvement:No  Treatment plan: Will employ cognitive behavioral therapy as well as grief and loss therapy for relief of anxiety and grief.  Goals of grief therapy or to have a healthier response to the loss of his daughter  and less sadness as evidenced by patient report in therapy notes as well as to help him feel less overwhelmed by her loss.  Goals are to see a 50% reduction in grief symptoms over the next 6 months.  I will encourage the use of the patient telling his grief story about his daughter including encouraging him to bring pictures and memorabilia to help process his feelings including any feelings of guilt associated with her loss.  We will use cognitive behavioral therapy to help identify and change anxiety producing thoughts and behavior patterns so as to improve his ability to better manage anxiety and stress, and manage thoughts and worrisome thinking contributing  to anxiety.  We will also refresh and encourage dialectical behavior therapy distress tolerance and mindfulness skills with the intention of reducing anxiety by 50% over the next 6 months. Progress: 30% Interventions: Cognitive Behavioral Therapy  Diagnosis:PTSD, Bi-Polar D/O  Plan: F/U/ appointments scheduled weekly.  [name], [facility]                                                                                                                   [name],  [facility]  [place] [facility] Counselor/Therapist Progress Note  Patient ID: Stephen Myers, MRN: [id number],    Date: 02/11/2024 This session was held via video teletherapy. The patient consented to the video teletherapy and was located in his home during this session. He is aware it is the responsibility of the patient to secure confidentiality on his end of the session. The provider was in a private home office for the duration of this session.    The patient arrived on time for her Caregility session  Time Spent: 58 minutes, 2 PM to 2:58 PM  Treatment Type: Individual Therapy Reported Symptoms: anxiety, panic attacks A fairly difficult week because it was his daughter's birthday.  He said this year was more  difficult than last year but he could not pinpoint why.  They did remember her about going out to a nice restaurant which is something her daughter always wanted to do.  He said they were able to laugh and joke about funny things that his daughter had said or done which is not something they were able to do this time last year so he saw that as healthy grieving.  He knows that the time change threw things off a little bit for everybody and his wife still is not feeling well and his back has been bothering him.  He was not able to go to his first round of physical therapy this week because his back was bothering him more.  He has been thinking a lot more about his daughter which he feels comfortable with.  He is thankful that it is getting warmer and staying light longer so he can get back to some of the coping skills such as fishing that he has done in the past.   We will continue to work on coping skills for helping with his anxiety and the progression of his Lewy body dementia diagnosis as well as processing his grief. Mental Status Exam: Appearance:  Well Groomed     Behavior: Appropriate  Motor: Pt. Reports some instability due to Lewey Body dementia  Speech/Language:  Normal Rate  Affect: Appropriate  Mood: normal  Thought process: normal  Thought content:   WNL  Sensory/Perceptual disturbances:   WNL  Orientation: oriented to person, place, and situation  Attention: Good  Concentration: Good  Memory: Impaired short term  Fund of knowledge:  Good  Insight:   Good  Judgment:  Good  Impulse Control: Good   Risk Assessment: Danger to Self:  No Self-injurious Behavior: No Danger to Others: No Duty to Warn:no Physical Aggression / Violence:No  Access to Firearms a concern: No  Gang Involvement:No  Treatment plan: Will employ cognitive behavioral therapy as well as grief and loss therapy for relief of anxiety and grief.  Goals of grief therapy or to have a healthier response to the loss  of his daughter and less sadness as evidenced by patient report in therapy notes as well as to help him feel less overwhelmed by her loss.  Goals are to see a 50% reduction in grief symptoms over the next 6 months.  I will encourage the use of the patient telling his grief story about his daughter including encouraging him to bring pictures and memorabilia to help process his feelings including any feelings of guilt associated with her loss.  We will use cognitive behavioral therapy to help identify and change anxiety producing thoughts and behavior patterns so as to improve his ability to better manage anxiety and stress, and manage thoughts  and worrisome thinking contributing to anxiety.  We will also refresh and encourage dialectical behavior therapy distress tolerance and mindfulness skills with the intention of reducing anxiety by 50% over the next 6 months. Progress: 30% Interventions: Cognitive Behavioral Therapy  Diagnosis:PTSD, Bi-Polar D/O  Plan: F/U/ appointments scheduled weekly.  [name], [facility]                                                                                                                   [name], [facility]               [name], [facility]               [name],  [facility]  [place] [facility] Counselor/Therapist Progress Note  Patient ID: Stephen Myers, MRN: [id number],    Date: 02/11/2024 This session was held via video teletherapy. The patient consented to the video teletherapy and was located in his home during this session. He is aware it is the responsibility of the patient to secure confidentiality on his end of the session. The provider was in a private home office for the duration of this session.    The patient arrived on time for her Caregility session  Time Spent: 57 minutes, 2 PM to 2:57 PM  Treatment Type: Individual Therapy Reported Symptoms: anxiety, panic attacks The patient is still having significant back pain.  He cannot sit anywhere for very long and has to be  careful where he sits.  There is still some unsteadiness although he did not report any falls over the past 2 weeks.  His biggest concern now is for his wife.  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  She is meeting with her cardiologist again next week to see what possible treatment is for her.  He still thinks there may be some issue with her diaphragm but she is meeting with the pulmonologist also.  He is doing what he can to help her.  His daughter's birthday is March 11 so he knows that is a difficult time for both he and his wife next week.  Typically they do something to remember her but because of the wife's current health issues they cannot go to [place] as they had planned so they are talking about what to do to remember and celebrate her birthday.  We will continue to work on coping skills for helping with his anxiety and the progression of his Lewy body dementia diagnosis as well as processing his grief. Mental Status Exam: Appearance:  Well Groomed     Behavior: Appropriate  Motor: Pt. Reports some instability due to Lewey Body dementia  Speech/Language:  Normal Rate  Affect: Appropriate  Mood: normal  Thought process: normal  Thought content:   WNL  Sensory/Perceptual disturbances:   WNL  Orientation: oriented to person, place, and situation  Attention: Good  Concentration: Good  Memory: Impaired short term  Fund of knowledge:  Good  Insight:   Good  Judgment:  Good  Impulse Control: Good   Risk Assessment: Danger to Self:  No Self-injurious Behavior: No Danger to Others: No Duty to Warn:no Physical Aggression / Violence:No  Access to Firearms a concern: No  Gang Involvement:No  Treatment plan: Will employ cognitive behavioral therapy as well as grief and loss therapy for relief of anxiety and grief.  Goals of grief therapy or to have a healthier response to the loss of his daughter  and less sadness as evidenced by patient report in therapy notes as well as to help him feel less overwhelmed by her loss.  Goals are to see a 50% reduction in grief symptoms over the next 6 months.  I will encourage the use of the patient telling his grief story about his daughter including encouraging him to bring pictures and memorabilia to help process his feelings including any feelings of guilt associated with her loss.  We will use cognitive behavioral therapy to help identify and change anxiety producing thoughts and behavior patterns so as to improve his ability to better manage anxiety and stress, and manage thoughts and worrisome thinking contributing  to anxiety.  We will also refresh and encourage dialectical behavior therapy distress tolerance and mindfulness skills with the intention of reducing anxiety by 50% over the next 6 months. Progress: 30% Interventions: Cognitive Behavioral Therapy  Diagnosis:PTSD, Bi-Polar D/O  Plan: F/U/ appointments scheduled weekly.  [name], [facility]                                                                                                                   [name],  [facility]  [place] [facility] Counselor/Therapist Progress Note  Patient ID: Stephen Myers, MRN: [id number],    Date: 02/11/2024 This session was held via video teletherapy. The patient consented to the video teletherapy and was located in his home during this session. He is aware it is the responsibility of the patient to secure confidentiality on his end of the session. The provider was in a private home office for the duration of this session.    The patient arrived on time for her Caregility session  Time Spent: 59 minutes, 2 PM to 2:59 PM  Treatment Type: Individual Therapy Reported Symptoms: anxiety, panic attacks The patient continues to present with physical pain and both his back and his knee.  He started  with a new physical therapist and told him that there was a lot of arthritis in both knees and or certain things he cannot do but they had him do that anyway and now for the past 3 days he has been in significant knee pain.  He has continued to walk because he knows that is good for him in various ways but says it has been painful.  His wife also was not feeling well but she is going back to the doctor next week to have an ablation and hopes that will bring her some relief.  For the most part he has been at the last couple weeks reaching back out and hearing from some old friends as well as time with his son and son's family.  He says that his wife's cousin is coming down for a weekend soon and they enjoy time with her and that he will go to his brother's house sometime in the next few weeks.  He recognizes the need for people that he cares about in helping with his depression. We will continue to work on coping skills for helping with his anxiety and the progression of his Lewy body dementia diagnosis as well as processing his grief. Mental Status Exam: Appearance:  Well Groomed     Behavior: Appropriate  Motor: Pt. Reports some instability due to Lewey Body dementia  Speech/Language:  Normal Rate  Affect: Appropriate  Mood: normal  Thought process: normal  Thought content:   WNL  Sensory/Perceptual disturbances:   WNL  Orientation: oriented to person, place, and situation  Attention: Good  Concentration: Good  Memory: Impaired short term  Fund of knowledge:  Good  Insight:   Good  Judgment:  Good  Impulse Control: Good   Risk Assessment: Danger to Self:  No Self-injurious Behavior: No Danger to Others: No Duty to Warn:no Physical Aggression / Violence:No  Access to Firearms a concern: No  Gang Involvement:No  Treatment plan: Will employ cognitive behavioral therapy as well as grief and loss therapy for relief of anxiety and grief.  Goals of grief therapy or to have a healthier response  to the loss of his daughter and less sadness as evidenced by patient report in therapy notes as well as to help him feel less overwhelmed by her loss.  Goals are to see a 50% reduction in grief symptoms over the next 6 months.  I will encourage the use of the patient telling his grief story about his daughter including encouraging him to bring pictures and memorabilia to help process his feelings including any feelings of guilt associated with her loss.  We will use cognitive behavioral therapy to help identify and change anxiety producing thoughts and behavior patterns so as to improve his ability to better  manage anxiety and stress, and manage thoughts and worrisome thinking contributing to anxiety.  We will also refresh and encourage dialectical behavior therapy distress tolerance and mindfulness skills with the intention of reducing anxiety by 50% over the next 6 months. Progress: 30% Interventions: Cognitive Behavioral Therapy  Diagnosis:PTSD, Bi-Polar D/O  Plan: F/U/ appointments scheduled weekly.  [name], [facility]                                                                                                                   [name], [facility]               [name], [facility]               [name], [facility]               [name],  [facility]  [place] [facility] Counselor/Therapist Progress Note  Patient ID: Stephen Myers, MRN: [id number],    Date: 02/11/2024 This session was held via video teletherapy. The patient consented to the video teletherapy and was located in his home during this session. He is aware it is the responsibility of the patient to secure confidentiality on his end of the session. The provider was in a private home office for the duration of this session.    The patient arrived on time for her Caregility session  Time Spent: 57 minutes, 2 PM to 2:57 PM  Treatment Type: Individual Therapy Reported Symptoms: anxiety, panic attacks The patient is still having significant back pain.  He cannot sit anywhere for very long and has to be  careful where he sits.  There is still some unsteadiness although he did not report any falls over the past 2 weeks.  His biggest concern now is for his wife.  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  She is meeting with her cardiologist again next week to see what possible treatment is for her.  He still thinks there may be some issue with her diaphragm but she is meeting with the pulmonologist also.  He is doing what he can to help her.  His daughter's birthday is March 11 so he knows that is a difficult time for both he and his wife next week.  Typically they do something to remember her but because of the wife's current health issues they cannot go to [place] as they had planned so they are talking about what to do to remember and celebrate her birthday.  We will continue to work on coping skills for helping with his anxiety and the progression of his Lewy body dementia diagnosis as well as processing his grief. 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  Speech/Language:  Normal Rate  Affect: Appropriate  Mood: normal  Thought process: normal  Thought content:   WNL  Sensory/Perceptual disturbances:   WNL  Orientation: oriented to person, place, and situation  Attention: Good  Concentration: Good  Memory: Impaired short term  Fund of knowledge:  Good  Insight:   Good  Judgment:  Good  Impulse Control: Good   Risk Assessment: Danger to Self:  No Self-injurious Behavior: No Danger to Others: No Duty to Warn:no Physical Aggression / Violence:No  Access to Firearms a concern: No  Gang Involvement:No  Treatment plan: Will employ cognitive behavioral therapy as well as grief and loss therapy for relief of anxiety and grief.  Goals of grief therapy or to have a healthier response to the loss of his daughter  and less sadness as evidenced by patient report in therapy notes as well as to help him feel less overwhelmed by her loss.  Goals are to see a 50% reduction in grief symptoms over the next 6 months.  I will encourage the use of the patient telling his grief story about his daughter including encouraging him to bring pictures and memorabilia to help process his feelings including any feelings of guilt associated with her loss.  We will use cognitive behavioral therapy to help identify and change anxiety producing thoughts and behavior patterns so as to improve his ability to better manage anxiety and stress, and manage thoughts and worrisome thinking contributing  to anxiety.  We will also refresh and encourage dialectical behavior therapy distress tolerance and mindfulness skills with the intention of reducing anxiety by 50% over the next 6 months. Progress: 30% Interventions: Cognitive Behavioral Therapy  Diagnosis:PTSD, Bi-Polar D/O  Plan: F/U/ appointments scheduled weekly.  [name], [facility]                                                                                                                   [name],  [facility]  [place] [facility] Counselor/Therapist Progress Note  Patient ID: Stephen Myers, MRN: [id number],    Date: 02/11/2024 This session was held via video teletherapy. The patient consented to the video teletherapy and was located in his home during this session. He is aware it is the responsibility of the patient to secure confidentiality on his end of the session. The provider was in a private home office for the duration of this session.    The patient arrived on time for her Caregility session  Time Spent: 58 minutes, 2 PM to 2:58 PM  Treatment Type: Individual Therapy Reported Symptoms: anxiety, panic attacks A fairly difficult week because it was his daughter's birthday.  He said this year was more  difficult than last year but he could not pinpoint why.  They did remember her about going out to a nice restaurant which is something her daughter always wanted to do.  He said they were able to laugh and joke about funny things that his daughter had said or done which is not something they were able to do this time last year so he saw that as healthy grieving.  He knows that the time change threw things off a little bit for everybody and his wife still is not feeling well and his back has been bothering him.  He was not able to go to his first round of physical therapy this week because his back was bothering him more.  He has been thinking a lot more about his daughter which he feels comfortable with.  He is thankful that it is getting warmer and staying light longer so he can get back to some of the coping skills such as fishing that he has done in the past.   We will continue to work on coping skills for helping with his anxiety and the progression of his Lewy body dementia diagnosis as well as processing his grief. 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  Speech/Language:  Normal Rate  Affect: Appropriate  Mood: normal  Thought process: normal  Thought content:   WNL  Sensory/Perceptual disturbances:   WNL  Orientation: oriented to person, place, and situation  Attention: Good  Concentration: Good  Memory: Impaired short term  Fund of knowledge:  Good  Insight:   Good  Judgment:  Good  Impulse Control: Good   Risk Assessment: Danger to Self:  No Self-injurious Behavior: No Danger to Others: No Duty to Warn:no Physical Aggression / Violence:No  Access to Firearms a concern: No  Gang Involvement:No  Treatment plan: Will employ cognitive behavioral therapy as well as grief and loss therapy for relief of anxiety and grief.  Goals of grief therapy or to have a healthier response to the loss  of his daughter and less sadness as evidenced by patient report in therapy notes as well as to help him feel less overwhelmed by her loss.  Goals are to see a 50% reduction in grief symptoms over the next 6 months.  I will encourage the use of the patient telling his grief story about his daughter including encouraging him to bring pictures and memorabilia to help process his feelings including any feelings of guilt associated with her loss.  We will use cognitive behavioral therapy to help identify and change anxiety producing thoughts and behavior patterns so as to improve his ability to better manage anxiety and stress, and manage thoughts  and worrisome thinking contributing to anxiety.  We will also refresh and encourage dialectical behavior therapy distress tolerance and mindfulness skills with the intention of reducing anxiety by 50% over the next 6 months. Progress: 30% Interventions: Cognitive Behavioral Therapy  Diagnosis:PTSD, Bi-Polar D/O  Plan: F/U/ appointments scheduled weekly.  [name], [facility]                                                                                                                   [name], [facility]               [name], [facility]               [name],  [facility]  [place] [facility] Counselor/Therapist Progress Note  Patient ID: Juriel Cid, MRN: [id number],    Date: 02/11/2024 This session was held via video teletherapy. The patient consented to the video teletherapy and was located in his home during this session. He is aware it is the responsibility of the patient to secure confidentiality on his end of the session. The provider was in a private home office for the duration of this session.    The patient arrived on time for her Caregility session  Time Spent: 57 minutes, 2 PM to 2:57 PM  Treatment Type: Individual Therapy Reported Symptoms: anxiety, panic attacks The patient is still having significant back pain.  He cannot sit anywhere for very long and has to be  careful where he sits.  There is still some unsteadiness although he did not report any falls over the past 2 weeks.  His biggest concern now is for his wife.  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  She is meeting with her cardiologist again next week to see what possible treatment is for her.  He still thinks there may be some issue with her diaphragm but she is meeting with the pulmonologist also.  He is doing what he can to help her.  His daughter's birthday is March 11 so he knows that is a difficult time for both he and his wife next week.  Typically they do something to remember her but because of the wife's current health issues they cannot go to [place] as they had planned so they are talking about what to do to remember and celebrate her birthday.  We will continue to work on coping skills for helping with his anxiety and the progression of his Lewy body dementia diagnosis as well as processing his grief. 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  Speech/Language:  Normal Rate  Affect: Appropriate  Mood: normal  Thought process: normal  Thought content:   WNL  Sensory/Perceptual disturbances:   WNL  Orientation: oriented to person, place, and situation  Attention: Good  Concentration: Good  Memory: Impaired short term  Fund of knowledge:  Good  Insight:   Good  Judgment:  Good  Impulse Control: Good   Risk Assessment: Danger to Self:  No Self-injurious Behavior: No Danger to Others: No Duty to Warn:no Physical Aggression / Violence:No  Access to Firearms a concern: No  Gang Involvement:No  Treatment plan: Will employ cognitive behavioral therapy as well as grief and loss therapy for relief of anxiety and grief.  Goals of grief therapy or to have a healthier response to the loss of his daughter  and less sadness as evidenced by patient report in therapy notes as well as to help him feel less overwhelmed by her loss.  Goals are to see a 50% reduction in grief symptoms over the next 6 months.  I will encourage the use of the patient telling his grief story about his daughter including encouraging him to bring pictures and memorabilia to help process his feelings including any feelings of guilt associated with her loss.  We will use cognitive behavioral therapy to help identify and change anxiety producing thoughts and behavior patterns so as to improve his ability to better manage anxiety and stress, and manage thoughts and worrisome thinking contributing  to anxiety.  We will also refresh and encourage dialectical behavior therapy distress tolerance and mindfulness skills with the intention of reducing anxiety by 50% over the next 6 months. Progress: 30% Interventions: Cognitive Behavioral Therapy  Diagnosis:PTSD, Bi-Polar D/O  Plan: F/U/ appointments scheduled weekly.  [name], [facility]                                                                                                                   [name],  [facility]  [place] [facility] Counselor/Therapist Progress Note  Patient ID: Stephen Myers Sacks, MRN: [id number],    Date: 02/11/2024 This session was held via video teletherapy. The patient consented to the video teletherapy and was located in his home during this session. He is aware it is the responsibility of the patient to secure confidentiality on his end of the session. The provider was in a private home office for the duration of this session.    The patient arrived on time for her Caregility session  Time Spent: 57 minutes, 2 PM to 2:57 PM  Treatment Type: Individual Therapy Reported Symptoms: anxiety, panic attacks  We will continue to work on coping skills for helping with his anxiety and the progression of  his Lewy body dementia diagnosis as well as processing his grief. 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  Speech/Language:  Normal Rate  Affect: Appropriate  Mood: normal  Thought process: normal  Thought content:   WNL  Sensory/Perceptual disturbances:   WNL  Orientation: oriented to person, place, and situation  Attention: Good  Concentration: Good  Memory: Impaired short term  Fund of knowledge:  Good  Insight:   Good  Judgment:  Good  Impulse Control: Good   Risk Assessment: Danger to Self:  No Self-injurious Behavior: No Danger to Others: No Duty to Warn:no Physical Aggression / Violence:No  Access to Firearms a concern: No  Gang Involvement:No  Treatment plan: Will employ cognitive behavioral therapy as well as grief and loss therapy for relief of anxiety and grief.  Goals of grief therapy or to have a healthier response to the loss of his daughter and less sadness as evidenced by patient report in therapy notes as well as to help him feel less overwhelmed by her loss.  Goals are to see a 50% reduction in grief symptoms over the next 6 months.  I will encourage the use of the patient telling his grief story about his daughter including encouraging him to bring pictures and memorabilia to help process his feelings including any feelings of guilt associated with her loss.  We will use cognitive behavioral therapy to help identify and change anxiety producing thoughts and behavior patterns so as to improve his ability to better manage anxiety and stress, and manage thoughts and worrisome thinking contributing to anxiety.  We will also refresh and encourage dialectical behavior therapy distress tolerance and mindfulness skills with the intention of reducing anxiety by 50% over the next 6 months. Progress: 30% Interventions: Cognitive Behavioral Therapy  Diagnosis:PTSD, Bi-Polar  D/O  Plan: F/U/ appointments scheduled weekly.  [name], [facility]                                                                                                                   [name], [facility]               [name], [facility]  [name], [facility]               [name], [facility]               [name], [facility]               [name], [facility]

## 2024-02-04 ENCOUNTER — Encounter: Payer: Self-pay | Admitting: Behavioral Health

## 2024-02-04 ENCOUNTER — Ambulatory Visit: Admitting: Behavioral Health

## 2024-02-04 DIAGNOSIS — F319 Bipolar disorder, unspecified: Secondary | ICD-10-CM

## 2024-02-04 DIAGNOSIS — F4310 Post-traumatic stress disorder, unspecified: Secondary | ICD-10-CM

## 2024-02-04 DIAGNOSIS — F3181 Bipolar II disorder: Secondary | ICD-10-CM

## 2024-02-04 NOTE — Progress Notes (Signed)
 [facility] Counselor/Therapist Progress Note  Patient ID: Stephen Myers, MRN: [id number],    Date: 02/04/2024 This session was held via video teletherapy. The patient consented to the video teletherapy and was located in his home during this session. He is aware it is the responsibility of the patient to secure confidentiality on his end of the session. The provider was in a private home office for the duration of this session.    The patient arrived on time for her Caregility session  Time Spent: 57 minutes, 2 PM to 2:57 PM  Treatment Type: Individual Therapy Reported Symptoms: anxiety, panic attacks The patient is still having significant back pain.  He cannot sit anywhere for very long and has to be careful  where he sits.  There is still some unsteadiness although he did not report any falls over the past 2 weeks.  His biggest concern now is for his wife.  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  She is meeting with her cardiologist again next week to see what possible treatment is for her.  He still thinks there may be some issue with her diaphragm but she is meeting with the pulmonologist also.  He is doing what he can to help her.  His daughter's birthday is March 11 so he knows that is a difficult time for both he and his wife next week.  Typically they do something to remember her but because of the wife's current health issues they cannot go to [place] as they had planned so they are talking about what to do to remember and celebrate her birthday.  We will continue to work on coping skills for helping with his anxiety and the progression of his Lewy body dementia diagnosis as well as processing his grief. Mental Status Exam: Appearance:  Well Groomed     Behavior: Appropriate  Motor: Pt. Reports some instability due to Lewey Body dementia  Speech/Language:  Normal Rate  Affect: Appropriate  Mood: normal  Thought process: normal  Thought content:   WNL  Sensory/Perceptual disturbances:   WNL  Orientation: oriented to person, place, and situation  Attention: Good  Concentration: Good  Memory: Impaired short term  Fund of knowledge:  Good  Insight:   Good  Judgment:  Good  Impulse Control: Good   Risk Assessment: Danger to Self:  No Self-injurious Behavior: No Danger to Others: No Duty to Warn:no Physical Aggression / Violence:No  Access to Firearms a concern: No  Gang Involvement:No  Treatment plan: Will employ cognitive behavioral therapy as well as grief and loss therapy for relief of anxiety and grief.  Goals of grief therapy or to have a healthier response to the loss of his daughter and  less sadness as evidenced by patient report in therapy notes as well as to help him feel less overwhelmed by her loss.  Goals are to see a 50% reduction in grief symptoms over the next 6 months.  I will encourage the use of the patient telling his grief story about his daughter including encouraging him to bring pictures and memorabilia to help process his feelings including any feelings of guilt associated with her loss.  We will use cognitive behavioral therapy to help identify and change anxiety producing thoughts and behavior patterns so as to improve his ability to better manage anxiety and stress, and manage thoughts and worrisome thinking contributing to anxiety.  We will also refresh and encourage dialectical behavior therapy distress tolerance and mindfulness skills with the intention of reducing anxiety by 50% over the next 6 months. Progress: 30% Interventions: Cognitive Behavioral Therapy  Diagnosis:PTSD, Bi-Polar D/O  Plan: F/U/ appointments scheduled weekly.  [name], [facility]                                                                                                                   [name],  [facility]  [facility] Counselor/Therapist Progress Note  Patient ID: Stephen Myers, MRN: [id number],    Date: 02/04/2024 This session was held via video teletherapy. The patient consented to the video teletherapy and was located in his home during this session. He is aware it is the responsibility of the patient to secure confidentiality on his end of the session. The provider was in a private home office for the duration of this session.    The patient arrived on time for her Caregility session  Time Spent: 58 minutes, 2 PM to 2:58 PM  Treatment Type: Individual Therapy Reported Symptoms: anxiety, panic attacks A fairly difficult week because it was his daughter's birthday.  He said this year was more  difficult than last year but he could not pinpoint why.  They did remember her about going out to a nice restaurant which is something her daughter always wanted to do.  He said they were able to laugh and joke about funny things that his daughter had said or done which is not something they were able to do this time last year so he saw that as healthy grieving.  He knows that the time change threw things off a little bit for everybody and his wife still is not feeling well and his back has been bothering him.  He was not able to go to his first round of physical therapy this week because his back was bothering him more.  He has been thinking a lot more about his daughter which he feels comfortable with.  He is thankful that it is getting warmer and staying light longer so he can get back to some of the coping skills such as fishing that he has done in the past.   We will continue to work on coping skills for helping with his anxiety and the progression of his Lewy body dementia diagnosis as well as processing his grief. Mental Status Exam: Appearance:  Well Groomed     Behavior: Appropriate  Motor: Pt. 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  Speech/Language:  Normal Rate  Affect: Appropriate  Mood: normal  Thought process: normal  Thought content:   WNL  Sensory/Perceptual disturbances:   WNL  Orientation: oriented to person, place, and situation  Attention: Good  Concentration: Good  Memory: Impaired short term  Fund of knowledge:  Good  Insight:   Good  Judgment:  Good  Impulse Control: Good   Risk Assessment: Danger to Self:  No Self-injurious Behavior: No Danger to Others: No Duty to Warn:no Physical Aggression / Violence:No  Access to Firearms a concern: No  Gang Involvement:No  Treatment plan: Will employ cognitive behavioral therapy as well as grief and loss therapy for relief of anxiety and grief.  Goals of grief therapy or to have a healthier response to the loss  of his daughter and less sadness as evidenced by patient report in therapy notes as well as to help him feel less overwhelmed by her loss.  Goals are to see a 50% reduction in grief symptoms over the next 6 months.  I will encourage the use of the patient telling his grief story about his daughter including encouraging him to bring pictures and memorabilia to help process his feelings including any feelings of guilt associated with her loss.  We will use cognitive behavioral therapy to help identify and change anxiety producing thoughts and behavior patterns so as to improve his ability to better manage anxiety and stress, and manage thoughts  and worrisome thinking contributing to anxiety.  We will also refresh and encourage dialectical behavior therapy distress tolerance and mindfulness skills with the intention of reducing anxiety by 50% over the next 6 months. Progress: 30% Interventions: Cognitive Behavioral Therapy  Diagnosis:PTSD, Bi-Polar D/O  Plan: F/U/ appointments scheduled weekly.  [name], [facility]                                                                                                                   [name], [facility]               [name], [facility]               [name],  [facility]  [facility] Counselor/Therapist Progress Note  Patient ID: Stephen Myers, MRN: [id number],    Date: 02/04/2024 This session was held via video teletherapy. The patient consented to the video teletherapy and was located in his home during this session. He is aware it is the responsibility of the patient to secure confidentiality on his end of the session. The provider was in a private home office for the duration of this session.    The patient arrived on time for her Caregility session  Time Spent: 1:05 PM until 2 PM, 55 minutes Treatment Type: Individual Therapy Reported Symptoms: anxiety, panic attacks The patient said he came in from his walk this morning and saw what he thought was some sort of tic from his wife.  It  was not the first time it happened and he wonders if it might be tardive dyskinesia because of some new medications that she is on.  She has a call into the doctor right now and hopes to hear something back soon.  They have narrowed down to the fact they think most of her discomfort in breathing is coming from the diaphragm and they are looking for alternatives for treatment.  The patient continues to do those things which help him cope including walking, fishing when he can, spending time with his son and daughter-in-law and grandson.  He also has a way of continued coping with grief he is writing letters to his daughters as a way of expressing his feelings and says that is very beneficial.  He appears to be grieving in a healthy way but he knows that he to year anniversary of her death is coming up and there is some grief there but says it is not overwhelming.  We will continue to work on coping skills for helping with his anxiety and the progression of his Lewy body dementia diagnosis as well as processing his grief. Mental Status Exam: Appearance:  Well Groomed     Behavior: Appropriate  Motor: Pt. 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  Speech/Language:  Normal Rate  Affect: Appropriate  Mood: normal  Thought process: normal  Thought content:   WNL  Sensory/Perceptual disturbances:   WNL  Orientation: oriented to person, place, and situation  Attention: Good  Concentration: Good  Memory: Impaired short term  Fund of knowledge:  Good  Insight:   Good  Judgment:  Good  Impulse Control: Good   Risk Assessment: Danger to Self:  No Self-injurious Behavior: No Danger to Others: No Duty to Warn:no Physical Aggression / Violence:No  Access to Firearms a concern: No  Gang Involvement:No  Treatment plan: Will employ cognitive behavioral therapy as well as grief and loss therapy for relief of anxiety and grief.  Goals of grief therapy or to have a healthier response to the loss  of his daughter and less sadness as evidenced by patient report in therapy notes as well as to help him feel less overwhelmed by her loss.  Goals are to see a 50% reduction in grief symptoms over the next 6 months.  I will encourage the use of the patient telling his grief story about his daughter including encouraging him to bring pictures and memorabilia to help process his feelings including any feelings of guilt associated with her loss.  We will use cognitive behavioral therapy to help identify and change anxiety producing thoughts and behavior patterns so as to improve his ability to better manage anxiety and stress, and manage thoughts and worrisome  thinking contributing to anxiety.  We will also refresh and encourage dialectical behavior therapy distress tolerance and mindfulness skills with the intention of reducing anxiety by 50% over the next 6 months. Progress: 30% Interventions: Cognitive Behavioral Therapy  Diagnosis:PTSD, Bi-Polar D/O  Plan: F/U/ appointments scheduled weekly.  [name], [facility]                                                                                                                   [name],  [facility]   [facility] Counselor/Therapist Progress Note  Patient ID: Stephen Myers, MRN: [id number],    Date: 02/04/2024 This session was held via video teletherapy. The patient consented to the video teletherapy and was located in his home during this session. He is aware it is the responsibility of the patient to secure confidentiality on his end of the session. The provider was in a private home office for the duration of this session.    The patient arrived on time for her Caregility session  Time Spent: 59 minutes, 2 PM to 2:59 PM  Treatment Type: Individual Therapy Reported Symptoms: anxiety, panic attacks The patient continues to present with physical pain and both his back and his knee.  He started  with a new physical therapist and told him that there was a lot of arthritis in both knees and or certain things he cannot do but they had him do that anyway and now for the past 3 days he has been in significant knee pain.  He has continued to walk because he knows that is good for him in various ways but says it has been painful.  His wife also was not feeling well but she is going back to the doctor next week to have an ablation and hopes that will bring her some relief.  For the most part he has been at the last couple weeks reaching back out and hearing from some old friends as well as time with his son and son's family.  He says that his wife's cousin is coming down for a weekend soon and they enjoy time with her and that he will go to his brother's house sometime in the next few weeks.  He recognizes the need for people that he cares about in helping with his depression. We will continue to work on coping skills for helping with his anxiety and the progression of his Lewy body dementia diagnosis as well as processing his grief. Mental Status Exam: Appearance:  Well Groomed     Behavior: Appropriate  Motor: Pt. 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  Speech/Language:  Normal Rate  Affect: Appropriate  Mood: normal  Thought process: normal  Thought content:   WNL  Sensory/Perceptual disturbances:   WNL  Orientation: oriented to person, place, and situation  Attention: Good  Concentration: Good  Memory: Impaired short term  Fund of knowledge:  Good  Insight:   Good  Judgment:  Good  Impulse Control: Good   Risk Assessment: Danger to Self:  No Self-injurious Behavior: No Danger to Others: No Duty to Warn:no Physical Aggression / Violence:No  Access to Firearms a concern: No  Gang Involvement:No  Treatment plan: Will employ cognitive behavioral therapy as well as grief and loss therapy for relief of anxiety and grief.  Goals of grief therapy or to have a healthier response  to the loss of his daughter and less sadness as evidenced by patient report in therapy notes as well as to help him feel less overwhelmed by her loss.  Goals are to see a 50% reduction in grief symptoms over the next 6 months.  I will encourage the use of the patient telling his grief story about his daughter including encouraging him to bring pictures and memorabilia to help process his feelings including any feelings of guilt associated with her loss.  We will use cognitive behavioral therapy to help identify and change anxiety producing thoughts and behavior patterns so as to improve his ability to better  manage anxiety and stress, and manage thoughts and worrisome thinking contributing to anxiety.  We will also refresh and encourage dialectical behavior therapy distress tolerance and mindfulness skills with the intention of reducing anxiety by 50% over the next 6 months. Progress: 30% Interventions: Cognitive Behavioral Therapy  Diagnosis:PTSD, Bi-Polar D/O  Plan: F/U/ appointments scheduled weekly.  [name], [facility]                                                                                                                   [name], [facility]               [name], [facility]               [name], [facility]               [name],  [facility]  [place] [facility] Counselor/Therapist Progress Note  Patient ID: Stephen Myers, MRN: [id number],    Date: 02/04/2024 This session was held via video teletherapy. The patient consented to the video teletherapy and was located in his home during this session. He is aware it is the responsibility of the patient to secure confidentiality on his end of the session. The provider was in a private home office for the duration of this session.    The patient arrived on time for her Caregility session  Time Spent: 57 minutes, 2 PM to 2:57 PM  Treatment Type: Individual Therapy Reported Symptoms: anxiety, panic attacks The patient is still having significant back pain.  He cannot sit anywhere for very long and has to be  careful where he sits.  There is still some unsteadiness although he did not report any falls over the past 2 weeks.  His biggest concern now is for his wife.  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  She is meeting with her cardiologist again next week to see what possible treatment is for her.  He still thinks there may be some issue with her diaphragm but she is meeting with the pulmonologist also.  He is doing what he can to help her.  His daughter's birthday is March 11 so he knows that is a difficult time for both he and his wife next week.  Typically they do something to remember her but because of the wife's current health issues they cannot go to [place] as they had planned so they are talking about what to do to remember and celebrate her birthday.  We will continue to work on coping skills for helping with his anxiety and the progression of his Lewy body dementia diagnosis as well as processing his grief. Mental Status Exam: Appearance:  Well Groomed     Behavior: Appropriate  Motor: Pt. 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  Speech/Language:  Normal Rate  Affect: Appropriate  Mood: normal  Thought process: normal  Thought content:   WNL  Sensory/Perceptual disturbances:   WNL  Orientation: oriented to person, place, and situation  Attention: Good  Concentration: Good  Memory: Impaired short term  Fund of knowledge:  Good  Insight:   Good  Judgment:  Good  Impulse Control: Good   Risk Assessment: Danger to Self:  No Self-injurious Behavior: No Danger to Others: No Duty to Warn:no Physical Aggression / Violence:No  Access to Firearms a concern: No  Gang Involvement:No  Treatment plan: Will employ cognitive behavioral therapy as well as grief and loss therapy for relief of anxiety and grief.  Goals of grief therapy or to have a healthier response to the loss of his daughter  and less sadness as evidenced by patient report in therapy notes as well as to help him feel less overwhelmed by her loss.  Goals are to see a 50% reduction in grief symptoms over the next 6 months.  I will encourage the use of the patient telling his grief story about his daughter including encouraging him to bring pictures and memorabilia to help process his feelings including any feelings of guilt associated with her loss.  We will use cognitive behavioral therapy to help identify and change anxiety producing thoughts and behavior patterns so as to improve his ability to better manage anxiety and stress, and manage thoughts and worrisome thinking contributing  to anxiety.  We will also refresh and encourage dialectical behavior therapy distress tolerance and mindfulness skills with the intention of reducing anxiety by 50% over the next 6 months. Progress: 30% Interventions: Cognitive Behavioral Therapy  Diagnosis:PTSD, Bi-Polar D/O  Plan: F/U/ appointments scheduled weekly.  [name], [facility]                                                                                                                   [name],  [facility]   [facility] Health Counselor/Therapist Progress Note  Patient ID: Stephen Myers, MRN: [id number],    Date: 02/04/2024 This session was held via video teletherapy. The patient consented to the video teletherapy and was located in his home during this session. He is aware it is the responsibility of the patient to secure confidentiality on his end of the session. The provider was in a private home office for the duration of this session.    The patient arrived on time for her Caregility session  Time Spent: 58 minutes, 2 PM to 2:58 PM  Treatment Type: Individual Therapy Reported Symptoms: anxiety, panic attacks A fairly difficult week because it was his daughter's birthday.  He said this year was more  difficult than last year but he could not pinpoint why.  They did remember her about going out to a nice restaurant which is something her daughter always wanted to do.  He said they were able to laugh and joke about funny things that his daughter had said or done which is not something they were able to do this time last year so he saw that as healthy grieving.  He knows that the time change threw things off a little bit for everybody and his wife still is not feeling well and his back has been bothering him.  He was not able to go to his first round of physical therapy this week because his back was bothering him more.  He has been thinking a lot more about his daughter which he feels comfortable with.  He is thankful that it is getting warmer and staying light longer so he can get back to some of the coping skills such as fishing that he has done in the past.   We will continue to work on coping skills for helping with his anxiety and the progression of his Lewy body dementia diagnosis as well as processing his grief. Mental Status Exam: Appearance:  Well Groomed     Behavior: Appropriate  Motor: Pt. 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  Speech/Language:  Normal Rate  Affect: Appropriate  Mood: normal  Thought process: normal  Thought content:   WNL  Sensory/Perceptual disturbances:   WNL  Orientation: oriented to person, place, and situation  Attention: Good  Concentration: Good  Memory: Impaired short term  Fund of knowledge:  Good  Insight:   Good  Judgment:  Good  Impulse Control: Good   Risk Assessment: Danger to Self:  No Self-injurious Behavior: No Danger to Others: No Duty to Warn:no Physical Aggression / Violence:No  Access to Firearms a concern: No  Gang Involvement:No  Treatment plan: Will employ cognitive behavioral therapy as well as grief and loss therapy for relief of anxiety and grief.  Goals of grief therapy or to have a healthier response to the loss  of his daughter and less sadness as evidenced by patient report in therapy notes as well as to help him feel less overwhelmed by her loss.  Goals are to see a 50% reduction in grief symptoms over the next 6 months.  I will encourage the use of the patient telling his grief story about his daughter including encouraging him to bring pictures and memorabilia to help process his feelings including any feelings of guilt associated with her loss.  We will use cognitive behavioral therapy to help identify and change anxiety producing thoughts and behavior patterns so as to improve his ability to better manage anxiety and stress, and manage thoughts  and worrisome thinking contributing to anxiety.  We will also refresh and encourage dialectical behavior therapy distress tolerance and mindfulness skills with the intention of reducing anxiety by 50% over the next 6 months. Progress: 30% Interventions: Cognitive Behavioral Therapy  Diagnosis:PTSD, Bi-Polar D/O  Plan: F/U/ appointments scheduled weekly.  [name], [facility]                                                                                                                   [name], [facility]               [name], [facility]               [name],  [facility]  [place] [facility] Counselor/Therapist Progress Note  Patient ID: Stephen Myers, MRN: [id number],    Date: 02/04/2024 This session was held via video teletherapy. The patient consented to the video teletherapy and was located in his home during this session. He is aware it is the responsibility of the patient to secure confidentiality on his end of the session. The provider was in a private home office for the duration of this session.    The patient arrived on time for her Caregility session  Time Spent: 57 minutes, 2 PM to 2:57 PM  Treatment Type: Individual Therapy Reported Symptoms: anxiety, panic attacks The patient is still having significant back pain.  He cannot sit anywhere for very long and has to be  careful where he sits.  There is still some unsteadiness although he did not report any falls over the past 2 weeks.  His biggest concern now is for his wife.  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  She is meeting with her cardiologist again next week to see what possible treatment is for her.  He still thinks there may be some issue with her diaphragm but she is meeting with the pulmonologist also.  He is doing what he can to help her.  His daughter's birthday is March 11 so he knows that is a difficult time for both he and his wife next week.  Typically they do something to remember her but because of the wife's current health issues they cannot go to [place] as they had planned so they are talking about what to do to remember and celebrate her birthday.  We will continue to work on coping skills for helping with his anxiety and the progression of his Lewy body dementia diagnosis as well as processing his grief. Mental Status Exam: Appearance:  Well Groomed     Behavior: Appropriate  Motor: Pt. Reports some instability due to Lewey Body dementia  Speech/Language:  Normal Rate  Affect: Appropriate  Mood: normal  Thought process: normal  Thought content:   WNL  Sensory/Perceptual disturbances:   WNL  Orientation: oriented to person, place, and situation  Attention: Good  Concentration: Good  Memory: Impaired short term  Fund of knowledge:  Good  Insight:   Good  Judgment:  Good  Impulse Control: Good   Risk Assessment: Danger to Self:  No Self-injurious Behavior: No Danger to Others: No Duty to Warn:no Physical Aggression / Violence:No  Access to Firearms a concern: No  Gang Involvement:No  Treatment plan: Will employ cognitive behavioral therapy as well as grief and loss therapy for relief of anxiety and grief.  Goals of grief therapy or to have a healthier response to the loss of his daughter  and less sadness as evidenced by patient report in therapy notes as well as to help him feel less overwhelmed by her loss.  Goals are to see a 50% reduction in grief symptoms over the next 6 months.  I will encourage the use of the patient telling his grief story about his daughter including encouraging him to bring pictures and memorabilia to help process his feelings including any feelings of guilt associated with her loss.  We will use cognitive behavioral therapy to help identify and change anxiety producing thoughts and behavior patterns so as to improve his ability to better manage anxiety and stress, and manage thoughts and worrisome thinking contributing  to anxiety.  We will also refresh and encourage dialectical behavior therapy distress tolerance and mindfulness skills with the intention of reducing anxiety by 50% over the next 6 months. Progress: 30% Interventions: Cognitive Behavioral Therapy  Diagnosis:PTSD, Bi-Polar D/O  Plan: F/U/ appointments scheduled weekly.  [name], [facility]                                                                                                                   [name],  [facility]  [facility] Counselor/Therapist Progress Note  Patient ID: Stephen Myers, MRN: [id number],    Date: 02/04/2024 This session was held via video teletherapy. The patient consented to the video teletherapy and was located in his home during this session. He is aware it is the responsibility of the patient to secure confidentiality on his end of the session. The provider was in a private home office for the duration of this session.    The patient arrived on time for her Caregility session  Time Spent: 57 minutes, 2 PM to 2:57 PM  Treatment Type: Individual Therapy Reported Symptoms: anxiety, panic attacks  We will continue to work on coping skills for helping with his anxiety and the progression of his  Lewy body dementia diagnosis as well as processing his grief. Mental Status Exam: Appearance:  Well Groomed     Behavior: Appropriate  Motor: Pt. Reports some instability due to Lewey Body dementia  Speech/Language:  Normal Rate  Affect: Appropriate  Mood: normal  Thought process: normal  Thought content:   WNL  Sensory/Perceptual disturbances:   WNL  Orientation: oriented to person, place, and situation  Attention: Good  Concentration: Good  Memory: Impaired short term  Fund of knowledge:  Good  Insight:   Good  Judgment:  Good  Impulse Control: Good   Risk Assessment: Danger to Self:  No Self-injurious Behavior: No Danger to Others: No Duty to Warn:no Physical Aggression / Violence:No  Access to Firearms a concern: No  Gang Involvement:No  Treatment plan: Will employ cognitive behavioral therapy as well as grief and loss therapy for relief of anxiety and grief.  Goals of grief therapy or to have a healthier response to the loss of his daughter and less sadness as evidenced by patient report in therapy notes as well as to help him feel less overwhelmed by her loss.  Goals are to see a 50% reduction in grief symptoms over the next 6 months.  I will encourage the use of the patient telling his grief story about his daughter including encouraging him to bring pictures and memorabilia to help process his feelings including any feelings of guilt associated with her loss.  We will use cognitive behavioral therapy to help identify and change anxiety producing thoughts and behavior patterns so as to improve his ability to better manage anxiety and stress, and manage thoughts and worrisome thinking contributing to anxiety.  We will also refresh and encourage dialectical behavior therapy distress tolerance and mindfulness skills with the intention of reducing anxiety by 50% over the next 6 months. Progress: 30% Interventions: Cognitive Behavioral Therapy  Diagnosis:PTSD, Bi-Polar D/O  Plan:  F/U/ appointments scheduled weekly.  [name], [facility]                                                                                                                   [name], [facility]               [name], [facility]  [name], [facility]               [name], [facility]               [name], [facility]               [name], [facility]

## 2024-02-05 ENCOUNTER — Ambulatory Visit: Payer: Medicare Other | Admitting: Behavioral Health

## 2024-02-11 ENCOUNTER — Encounter: Payer: Self-pay | Admitting: Behavioral Health

## 2024-02-12 ENCOUNTER — Encounter: Payer: Self-pay | Admitting: Behavioral Health

## 2024-02-12 ENCOUNTER — Ambulatory Visit: Payer: Medicare Other | Admitting: Behavioral Health

## 2024-02-12 DIAGNOSIS — F3181 Bipolar II disorder: Secondary | ICD-10-CM

## 2024-02-12 DIAGNOSIS — F4310 Post-traumatic stress disorder, unspecified: Secondary | ICD-10-CM | POA: Diagnosis not present

## 2024-02-12 DIAGNOSIS — F319 Bipolar disorder, unspecified: Secondary | ICD-10-CM | POA: Diagnosis not present

## 2024-02-12 NOTE — Progress Notes (Signed)
 Section Behavioral  [facility] Counselor/Therapist Progress Note  Patient ID: Stephen Myers, MRN: [id number],    Date: 02/12/2024 This session was held via video teletherapy. The patient consented to the video teletherapy and was located in his home during this session. He is aware it is the responsibility of the patient to secure confidentiality on his end of the session. The provider was in a private home office for the duration of this session.    The patient arrived on time for her Caregility session  Time Spent: 39 minutes, 2 PM to 2:39 PM  Treatment Type: Individual Therapy Reported Symptoms: anxiety, panic attacks The patient was not feeling well today.  He started feeling really earlier in the week having a fever bad  headaches body aches.  He felt a little better yesterday but feels a little bit of a resurgence in headaches and body aches and is running a low-grade fever.  His wife is feeling some of the same symptoms.  For the most part he says he is doing well.  They did increase his memory medication because they were starting to see some increasing decline in short-term memory and he said until recently his long-term memory had been good but he was starting to see a difference in how he used to markers to remember what happened at certain times historically.  He feels the medication has helped and has brightened his mood a little also.  He is still trying to walk every day and fish when he can and has enjoyed watching the [employer] as a good distraction.  He reports that he feels he is managing mood fairly well.  He has an appointment in 2 weeks.  He does contract for safety having no thoughts of hurting himself or anyone else. We will continue to work on coping skills for helping with his anxiety and the progression of his Lewy body dementia diagnosis as well as processing his grief. Mental Status Exam: Appearance:  Well Groomed     Behavior: Appropriate  Motor: Pt. Reports some instability due to Lewey Body dementia  Speech/Language:  Normal Rate  Affect: Appropriate  Mood: normal  Thought process: normal  Thought content:   WNL  Sensory/Perceptual disturbances:   WNL  Orientation: oriented to person, place, and situation  Attention: Good  Concentration: Good  Memory: Impaired short term  Fund of knowledge:  Good  Insight:   Good  Judgment:  Good  Impulse Control: Good   Risk Assessment: Danger to Self:  No Self-injurious Behavior: No Danger to Others: No Duty to Warn:no Physical Aggression / Violence:No  Access to Firearms a concern: No  Gang Involvement:No  Treatment plan: Will employ cognitive behavioral therapy as well as grief and loss therapy for relief of anxiety and  grief.  Goals of grief therapy or to have a healthier response to the loss of his daughter and less sadness as evidenced by patient report in therapy notes as well as to help him feel less overwhelmed by her loss.  Goals are to see a 50% reduction in grief symptoms over the next 6 months.  I will encourage the use of the patient telling his grief story about his daughter including encouraging him to bring pictures and memorabilia to help process his feelings including any feelings of guilt associated with her loss.  We will use cognitive behavioral therapy to help identify and change anxiety producing thoughts and behavior patterns so as to improve his ability to better manage anxiety and stress,  and manage thoughts and worrisome thinking contributing to anxiety.  We will also refresh and encourage dialectical behavior therapy distress tolerance and mindfulness skills with the intention of reducing anxiety by 50% over the next 6 months. Progress: 30% Interventions: Cognitive Behavioral Therapy  Diagnosis:PTSD, Bi-Polar D/O  Plan: F/U/ appointments scheduled weekly.  [name], [facility]                                                                                                                   [name],  [facility]  [place] [facility] Counselor/Therapist Progress Note  Patient ID: Stephen Myers, MRN: [id number],    Date: 02/12/2024 This session was held via video teletherapy. The patient consented to the video teletherapy and was located in his home during this session. He is aware it is the responsibility of the patient to secure confidentiality on his end of the session. The provider was in a private home office for the duration of this session.    The patient arrived on time for her Caregility session  Time Spent: 58 minutes, 2 PM to 2:58 PM  Treatment Type: Individual Therapy Reported Symptoms: anxiety, panic attacks A fairly difficult week because it was his daughter's birthday.  He said this year was more  difficult than last year but he could not pinpoint why.  They did remember her about going out to a nice restaurant which is something her daughter always wanted to do.  He said they were able to laugh and joke about funny things that his daughter had said or done which is not something they were able to do this time last year so he saw that as healthy grieving.  He knows that the time change threw things off a little bit for everybody and his wife still is not feeling well and his back has been bothering him.  He was not able to go to his first round of physical therapy this week because his back was bothering him more.  He has been thinking a lot more about his daughter which he feels comfortable with.  He is thankful that it is getting warmer and staying light longer so he can get back to some of the coping skills such as fishing that he has done in the past.   We will continue to work on coping skills for helping with his anxiety and the progression of his Lewy body dementia diagnosis as well as processing his grief. Mental Status Exam: Appearance:  Well Groomed     Behavior: Appropriate  Motor: Pt. Reports some instability due to Lewey Body dementia  Speech/Language:  Normal Rate  Affect: Appropriate  Mood: normal  Thought process: normal  Thought content:   WNL  Sensory/Perceptual disturbances:   WNL  Orientation: oriented to person, place, and situation  Attention: Good  Concentration: Good  Memory: Impaired short term  Fund of knowledge:  Good  Insight:   Good  Judgment:  Good  Impulse Control: Good   Risk Assessment: Danger to Self:  No Self-injurious Behavior: No Danger to Others: No Duty to Warn:no Physical Aggression / Violence:No  Access to Firearms a concern: No  Gang Involvement:No  Treatment plan: Will employ cognitive behavioral therapy as well as grief and loss therapy for relief of anxiety and grief.  Goals of grief therapy or to have a healthier response to the loss  of his daughter and less sadness as evidenced by patient report in therapy notes as well as to help him feel less overwhelmed by her loss.  Goals are to see a 50% reduction in grief symptoms over the next 6 months.  I will encourage the use of the patient telling his grief story about his daughter including encouraging him to bring pictures and memorabilia to help process his feelings including any feelings of guilt associated with her loss.  We will use cognitive behavioral therapy to help identify and change anxiety producing thoughts and behavior patterns so as to improve his ability to better manage anxiety and stress, and manage thoughts  and worrisome thinking contributing to anxiety.  We will also refresh and encourage dialectical behavior therapy distress tolerance and mindfulness skills with the intention of reducing anxiety by 50% over the next 6 months. Progress: 30% Interventions: Cognitive Behavioral Therapy  Diagnosis:PTSD, Bi-Polar D/O  Plan: F/U/ appointments scheduled weekly.  [name], [facility]                                                                                                                   [name], [facility]               [name], [facility]               [name],  [facility]  [place] [facility] Counselor/Therapist Progress Note  Patient ID: Clemon Devaul, MRN: [id number],    Date: 02/12/2024 This session was held via video teletherapy. The patient consented to the video teletherapy and was located in his home during this session. He is aware it is the responsibility of the patient to secure confidentiality on his end of the session. The provider was in a private home office for the duration of this session.    The patient arrived on time for her Caregility session  Time Spent: 1:05 PM until 2 PM, 55 minutes Treatment Type: Individual Therapy Reported Symptoms: anxiety, panic attacks The patient said he came in from his walk this morning and saw what he thought was some sort of tic from his wife.  It  was not the first time it happened and he wonders if it might be tardive dyskinesia because of some new medications that she is on.  She has a call into the doctor right now and hopes to hear something back soon.  They have narrowed down to the fact they think most of her discomfort in breathing is coming from the diaphragm and they are looking for alternatives for treatment.  The patient continues to do those things which help him cope including walking, fishing when he can, spending time with his son and daughter-in-law and grandson.  He also has a way of continued coping with grief he is writing letters to his daughters as a way of expressing his feelings and says that is very beneficial.  He appears to be grieving in a healthy way but he knows that he to year anniversary of her death is coming up and there is some grief there but says it is not overwhelming.  We will continue to work on coping skills for helping with his anxiety and the progression of his Lewy body dementia diagnosis as well as processing his grief. Mental Status Exam: Appearance:  Well Groomed     Behavior: Appropriate  Motor: Pt. Reports some instability due to Lewey Body dementia  Speech/Language:  Normal Rate  Affect: Appropriate  Mood: normal  Thought process: normal  Thought content:   WNL  Sensory/Perceptual disturbances:   WNL  Orientation: oriented to person, place, and situation  Attention: Good  Concentration: Good  Memory: Impaired short term  Fund of knowledge:  Good  Insight:   Good  Judgment:  Good  Impulse Control: Good   Risk Assessment: Danger to Self:  No Self-injurious Behavior: No Danger to Others: No Duty to Warn:no Physical Aggression / Violence:No  Access to Firearms a concern: No  Gang Involvement:No  Treatment plan: Will employ cognitive behavioral therapy as well as grief and loss therapy for relief of anxiety and grief.  Goals of grief therapy or to have a healthier response to the loss  of his daughter and less sadness as evidenced by patient report in therapy notes as well as to help him feel less overwhelmed by her loss.  Goals are to see a 50% reduction in grief symptoms over the next 6 months.  I will encourage the use of the patient telling his grief story about his daughter including encouraging him to bring pictures and memorabilia to help process his feelings including any feelings of guilt associated with her loss.  We will use cognitive behavioral therapy to help identify and change anxiety producing thoughts and behavior patterns so as to improve his ability to better manage anxiety and stress, and manage thoughts and worrisome  thinking contributing to anxiety.  We will also refresh and encourage dialectical behavior therapy distress tolerance and mindfulness skills with the intention of reducing anxiety by 50% over the next 6 months. Progress: 30% Interventions: Cognitive Behavioral Therapy  Diagnosis:PTSD, Bi-Polar D/O  Plan: F/U/ appointments scheduled weekly.  [name], [facility]                                                                                                                   [name],  [facility]  [place] [facility] Counselor/Therapist Progress Note  Patient ID: Anne Sebring, MRN: [id number],    Date: 02/12/2024 This session was held via video teletherapy. The patient consented to the video teletherapy and was located in his home during this session. He is aware it is the responsibility of the patient to secure confidentiality on his end of the session. The provider was in a private home office for the duration of this session.    The patient arrived on time for her Caregility session  Time Spent: 59 minutes, 2 PM to 2:59 PM  Treatment Type: Individual Therapy Reported Symptoms: anxiety, panic attacks The patient continues to present with physical pain and both his back and his knee.  He started  with a new physical therapist and told him that there was a lot of arthritis in both knees and or certain things he cannot do but they had him do that anyway and now for the past 3 days he has been in significant knee pain.  He has continued to walk because he knows that is good for him in various ways but says it has been painful.  His wife also was not feeling well but she is going back to the doctor next week to have an ablation and hopes that will bring her some relief.  For the most part he has been at the last couple weeks reaching back out and hearing from some old friends as well as time with his son and son's family.  He says that his wife's cousin is coming down for a weekend soon and they enjoy time with her and that he will go to his brother's house sometime in the next few weeks.  He recognizes the need for people that he cares about in helping with his depression. We will continue to work on coping skills for helping with his anxiety and the progression of his Lewy body dementia diagnosis as well as processing his grief. Mental Status Exam: Appearance:  Well Groomed     Behavior: Appropriate  Motor: Pt. Reports some instability due to Lewey Body dementia  Speech/Language:  Normal Rate  Affect: Appropriate  Mood: normal  Thought process: normal  Thought content:   WNL  Sensory/Perceptual disturbances:   WNL  Orientation: oriented to person, place, and situation  Attention: Good  Concentration: Good  Memory: Impaired short term  Fund of knowledge:  Good  Insight:   Good  Judgment:  Good  Impulse Control: Good   Risk Assessment: Danger to Self:  No Self-injurious Behavior: No Danger to Others: No Duty to Warn:no Physical Aggression / Violence:No  Access to Firearms a concern: No  Gang Involvement:No  Treatment plan: Will employ cognitive behavioral therapy as well as grief and loss therapy for relief of anxiety and grief.  Goals of grief therapy or to have a healthier response  to the loss of his daughter and less sadness as evidenced by patient report in therapy notes as well as to help him feel less overwhelmed by her loss.  Goals are to see a 50% reduction in grief symptoms over the next 6 months.  I will encourage the use of the patient telling his grief story about his daughter including encouraging him to bring pictures and memorabilia to help process his feelings including any feelings of guilt associated with her loss.  We will use cognitive behavioral therapy to help identify and change anxiety producing thoughts and behavior patterns so as to improve his ability to better  manage anxiety and stress, and manage thoughts and worrisome thinking contributing to anxiety.  We will also refresh and encourage dialectical behavior therapy distress tolerance and mindfulness skills with the intention of reducing anxiety by 50% over the next 6 months. Progress: 30% Interventions: Cognitive Behavioral Therapy  Diagnosis:PTSD, Bi-Polar D/O  Plan: F/U/ appointments scheduled weekly.  [name], [facility]                                                                                                                   [name], [facility]               [name], [facility]               [name], [facility]               [name],  [facility]  [place] [facility] Counselor/Therapist Progress Note  Patient ID: Ripley Bogosian, MRN: [id number],    Date: 02/12/2024 This session was held via video teletherapy. The patient consented to the video teletherapy and was located in his home during this session. He is aware it is the responsibility of the patient to secure confidentiality on his end of the session. The provider was in a private home office for the duration of this session.    The patient arrived on time for her Caregility session  Time Spent: 57 minutes, 2 PM to 2:57 PM  Treatment Type: Individual Therapy Reported Symptoms: anxiety, panic attacks The patient is still having significant back pain.  He cannot sit anywhere for very long and has to be  careful where he sits.  There is still some unsteadiness although he did not report any falls over the past 2 weeks.  His biggest concern now is for his wife.  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  She is meeting with her cardiologist again next week to see what possible treatment is for her.  He still thinks there may be some issue with her diaphragm but she is meeting with the pulmonologist also.  He is doing what he can to help her.  His daughter's birthday is March 11 so he knows that is a difficult time for both he and his wife next week.  Typically they do something to remember her but because of the wife's current health issues they cannot go to [place] as they had planned so they are talking about what to do to remember and celebrate her birthday.  We will continue to work on coping skills for helping with his anxiety and the progression of his Lewy body dementia diagnosis as well as processing his grief. Mental Status Exam: Appearance:  Well Groomed     Behavior: Appropriate  Motor: Pt. Reports some instability due to Lewey Body dementia  Speech/Language:  Normal Rate  Affect: Appropriate  Mood: normal  Thought process: normal  Thought content:   WNL  Sensory/Perceptual disturbances:   WNL  Orientation: oriented to person, place, and situation  Attention: Good  Concentration: Good  Memory: Impaired short term  Fund of knowledge:  Good  Insight:   Good  Judgment:  Good  Impulse Control: Good   Risk Assessment: Danger to Self:  No Self-injurious Behavior: No Danger to Others: No Duty to Warn:no Physical Aggression / Violence:No  Access to Firearms a concern: No  Gang Involvement:No  Treatment plan: Will employ cognitive behavioral therapy as well as grief and loss therapy for relief of anxiety and grief.  Goals of grief therapy or to have a healthier response to the loss of his daughter  and less sadness as evidenced by patient report in therapy notes as well as to help him feel less overwhelmed by her loss.  Goals are to see a 50% reduction in grief symptoms over the next 6 months.  I will encourage the use of the patient telling his grief story about his daughter including encouraging him to bring pictures and memorabilia to help process his feelings including any feelings of guilt associated with her loss.  We will use cognitive behavioral therapy to help identify and change anxiety producing thoughts and behavior patterns so as to improve his ability to better manage anxiety and stress, and manage thoughts and worrisome thinking contributing  to anxiety.  We will also refresh and encourage dialectical behavior therapy distress tolerance and mindfulness skills with the intention of reducing anxiety by 50% over the next 6 months. Progress: 30% Interventions: Cognitive Behavioral Therapy  Diagnosis:PTSD, Bi-Polar D/O  Plan: F/U/ appointments scheduled weekly.  [name], [facility]                                                                                                                   [name],  [facility]  [place] [facility] Counselor/Therapist Progress Note  Patient ID: Bernhard Koskinen, MRN: [id number],    Date: 02/12/2024 This session was held via video teletherapy. The patient consented to the video teletherapy and was located in his home during this session. He is aware it is the responsibility of the patient to secure confidentiality on his end of the session. The provider was in a private home office for the duration of this session.    The patient arrived on time for her Caregility session  Time Spent: 58 minutes, 2 PM to 2:58 PM  Treatment Type: Individual Therapy Reported Symptoms: anxiety, panic attacks A fairly difficult week because it was his daughter's birthday.  He said this year was more  difficult than last year but he could not pinpoint why.  They did remember her about going out to a nice restaurant which is something her daughter always wanted to do.  He said they were able to laugh and joke about funny things that his daughter had said or done which is not something they were able to do this time last year so he saw that as healthy grieving.  He knows that the time change threw things off a little bit for everybody and his wife still is not feeling well and his back has been bothering him.  He was not able to go to his first round of physical therapy this week because his back was bothering him more.  He has been thinking a lot more about his daughter which he feels comfortable with.  He is thankful that it is getting warmer and staying light longer so he can get back to some of the coping skills such as fishing that he has done in the past.   We will continue to work on coping skills for helping with his anxiety and the progression of his Lewy body dementia diagnosis as well as processing his grief. Mental Status Exam: Appearance:  Well Groomed     Behavior: Appropriate  Motor: Pt. Reports some instability due to Lewey Body dementia  Speech/Language:  Normal Rate  Affect: Appropriate  Mood: normal  Thought process: normal  Thought content:   WNL  Sensory/Perceptual disturbances:   WNL  Orientation: oriented to person, place, and situation  Attention: Good  Concentration: Good  Memory: Impaired short term  Fund of knowledge:  Good  Insight:   Good  Judgment:  Good  Impulse Control: Good   Risk Assessment: Danger to Self:  No Self-injurious Behavior: No Danger to Others: No Duty to Warn:no Physical Aggression / Violence:No  Access to Firearms a concern: No  Gang Involvement:No  Treatment plan: Will employ cognitive behavioral therapy as well as grief and loss therapy for relief of anxiety and grief.  Goals of grief therapy or to have a healthier response to the loss  of his daughter and less sadness as evidenced by patient report in therapy notes as well as to help him feel less overwhelmed by her loss.  Goals are to see a 50% reduction in grief symptoms over the next 6 months.  I will encourage the use of the patient telling his grief story about his daughter including encouraging him to bring pictures and memorabilia to help process his feelings including any feelings of guilt associated with her loss.  We will use cognitive behavioral therapy to help identify and change anxiety producing thoughts and behavior patterns so as to improve his ability to better manage anxiety and stress, and manage thoughts  and worrisome thinking contributing to anxiety.  We will also refresh and encourage dialectical behavior therapy distress tolerance and mindfulness skills with the intention of reducing anxiety by 50% over the next 6 months. Progress: 30% Interventions: Cognitive Behavioral Therapy  Diagnosis:PTSD, Bi-Polar D/O  Plan: F/U/ appointments scheduled weekly.  [name], [facility]                                                                                                                   [name], [facility]               [name], [facility]               [name],  [facility]  [place] [facility] Counselor/Therapist Progress Note  Patient ID: Denny Mccree, MRN: [id number],    Date: 02/12/2024 This session was held via video teletherapy. The patient consented to the video teletherapy and was located in his home during this session. He is aware it is the responsibility of the patient to secure confidentiality on his end of the session. The provider was in a private home office for the duration of this session.    The patient arrived on time for her Caregility session  Time Spent: 57 minutes, 2 PM to 2:57 PM  Treatment Type: Individual Therapy Reported Symptoms: anxiety, panic attacks The patient is still having significant back pain.  He cannot sit anywhere for very long and has to be  careful where he sits.  There is still some unsteadiness although he did not report any falls over the past 2 weeks.  His biggest concern now is for his wife.  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  She is meeting with her cardiologist again next week to see what possible treatment is for her.  He still thinks there may be some issue with her diaphragm but she is meeting with the pulmonologist also.  He is doing what he can to help her.  His daughter's birthday is March 11 so he knows that is a difficult time for both he and his wife next week.  Typically they do something to remember her but because of the wife's current health issues they cannot go to [place] as they had planned so they are talking about what to do to remember and celebrate her birthday.  We will continue to work on coping skills for helping with his anxiety and the progression of his Lewy body dementia diagnosis as well as processing his grief. Mental Status Exam: Appearance:  Well Groomed     Behavior: Appropriate  Motor: Pt. Reports some instability due to Lewey Body dementia  Speech/Language:  Normal Rate  Affect: Appropriate  Mood: normal  Thought process: normal  Thought content:   WNL  Sensory/Perceptual disturbances:   WNL  Orientation: oriented to person, place, and situation  Attention: Good  Concentration: Good  Memory: Impaired short term  Fund of knowledge:  Good  Insight:   Good  Judgment:  Good  Impulse Control: Good   Risk Assessment: Danger to Self:  No Self-injurious Behavior: No Danger to Others: No Duty to Warn:no Physical Aggression / Violence:No  Access to Firearms a concern: No  Gang Involvement:No  Treatment plan: Will employ cognitive behavioral therapy as well as grief and loss therapy for relief of anxiety and grief.  Goals of grief therapy or to have a healthier response to the loss of his daughter  and less sadness as evidenced by patient report in therapy notes as well as to help him feel less overwhelmed by her loss.  Goals are to see a 50% reduction in grief symptoms over the next 6 months.  I will encourage the use of the patient telling his grief story about his daughter including encouraging him to bring pictures and memorabilia to help process his feelings including any feelings of guilt associated with her loss.  We will use cognitive behavioral therapy to help identify and change anxiety producing thoughts and behavior patterns so as to improve his ability to better manage anxiety and stress, and manage thoughts and worrisome thinking contributing  to anxiety.  We will also refresh and encourage dialectical behavior therapy distress tolerance and mindfulness skills with the intention of reducing anxiety by 50% over the next 6 months. Progress: 30% Interventions: Cognitive Behavioral Therapy  Diagnosis:PTSD, Bi-Polar D/O  Plan: F/U/ appointments scheduled weekly.  [name], [facility]                                                                                                                   [name],  [facility]  [facility] Counselor/Therapist Progress Note  Patient ID: Josias Tomerlin, MRN: [id number],    Date: 02/12/2024 This session was held via video teletherapy. The patient consented to the video teletherapy and was located in his home during this session. He is aware it is the responsibility of the patient to secure confidentiality on his end of the session. The provider was in a private home office for the duration of this session.    The patient arrived on time for her Caregility session  Time Spent: 57 minutes, 2 PM to 2:57 PM  Treatment Type: Individual Therapy Reported Symptoms: anxiety, panic attacks  We will continue to work on coping skills for helping with his anxiety and the progression of  his Lewy body dementia diagnosis as well as processing his grief. Mental Status Exam: Appearance:  Well Groomed     Behavior: Appropriate  Motor: Pt. Reports some instability due to Lewey Body dementia  Speech/Language:  Normal Rate  Affect: Appropriate  Mood: normal  Thought process: normal  Thought content:   WNL  Sensory/Perceptual disturbances:   WNL  Orientation: oriented to person, place, and situation  Attention: Good  Concentration: Good  Memory: Impaired short term  Fund of knowledge:  Good  Insight:   Good  Judgment:  Good  Impulse Control: Good   Risk Assessment: Danger to Self:  No Self-injurious Behavior: No Danger to Others: No Duty to Warn:no Physical Aggression / Violence:No  Access to Firearms a concern: No  Gang Involvement:No  Treatment plan: Will employ cognitive behavioral therapy as well as grief and loss therapy for relief of anxiety and grief.  Goals of grief therapy or to have a healthier response to the loss of his daughter and less sadness as evidenced by patient report in therapy notes as well as to help him feel less overwhelmed by her loss.  Goals are to see a 50% reduction in grief symptoms over the next 6 months.  I will encourage the use of the patient telling his grief story about his daughter including encouraging him to bring pictures and memorabilia to help process his feelings including any feelings of guilt associated with her loss.  We will use cognitive behavioral therapy to help identify and change anxiety producing thoughts and behavior patterns so as to improve his ability to better manage anxiety and stress, and manage thoughts and worrisome thinking contributing to anxiety.  We will also refresh and encourage dialectical behavior therapy distress tolerance and mindfulness skills with the intention of reducing anxiety by 50% over the next 6 months. Progress: 30% Interventions: Cognitive Behavioral Therapy  Diagnosis:PTSD, Bi-Polar  D/O  Plan: F/U/ appointments scheduled weekly.  [name], [facility]                                                                                                                   [name], [facility]               [name], [facility]  [name], [facility]               [name], [facility]               [name], [facility]               [name], [facility]               [name], [facility]

## 2024-02-26 ENCOUNTER — Ambulatory Visit: Payer: Medicare Other | Admitting: Behavioral Health

## 2024-02-26 ENCOUNTER — Encounter: Payer: Self-pay | Admitting: Behavioral Health

## 2024-02-26 DIAGNOSIS — F3181 Bipolar II disorder: Secondary | ICD-10-CM

## 2024-02-26 DIAGNOSIS — F4310 Post-traumatic stress disorder, unspecified: Secondary | ICD-10-CM | POA: Diagnosis not present

## 2024-02-26 DIAGNOSIS — F319 Bipolar disorder, unspecified: Secondary | ICD-10-CM

## 2024-02-26 NOTE — Progress Notes (Signed)
 [facility] Counselor/Therapist Progress Note  Patient ID: Stephen Myers, MRN: [id number],    Date: 02/26/2024 This session was held via video teletherapy. The patient consented to the video teletherapy and was located in his home during this session. He is aware it is the responsibility of the patient to secure confidentiality on his end of the session. The provider was in a private home office for the duration of this session.    The patient arrived on time for her Caregility session  Time Spent: 59 minutes, 2 PM to 2:59 PM  Treatment Type: Individual Therapy Reported Symptoms: anxiety, panic attacks The patient said he jokingly said to his wife that he had not had any pain issues in the cervical area and then a  few days ago he started having pain in his neck and shoulders.  He now sits related to degenerative disk and has some consistent pain somewhere in his spine with that but jokingly says it just depends on Wednesday which part of his body is decides to show up and hurt.  He is doing the best that he can.  He is still walking which he knows is good for him but does not have right now the strength to do as much in the morning as he has done in the past so he is denying that to 3 times a day for consistency.  He is still doing some positive things and that he is staying in touch with his brother and other family members.  He is reconnecting with friends from high school and he is which she is enjoying.  One of them has had a similar experience in that she lost her son in the same way the patient lost his daughter.  He said they did not think and Easter in which they set a place for his daughter at the table and put her picture there.  He initially had reservations about doing that he and his wife had made everyone feel as if she was really there with him.  He still feels that he is grieving in a healthy way.  He and his wife have always taken a trip around both of their birthdays which are 2 days apart.  That is now close to his daughter died so they have started taking some of her rashes with him and sprinkling them where they go as a way of remembrance.  They are deciding what that looks like this year in July.  He does contract for safety having no thoughts of hurting himself or anyone else. We will continue to work on coping skills for helping with his anxiety and the progression of his Lewy body dementia diagnosis as well as processing his grief. Mental Status Exam: Appearance:  Well Groomed     Behavior: Appropriate  Motor: Pt. Reports some instability due to Lewey Body dementia  Speech/Language:  Normal Rate  Affect: Appropriate  Mood: normal  Thought process: normal  Thought content:   WNL   Sensory/Perceptual disturbances:   WNL  Orientation: oriented to person, place, and situation  Attention: Good  Concentration: Good  Memory: Impaired short term  Fund of knowledge:  Good  Insight:   Good  Judgment:  Good  Impulse Control: Good   Risk Assessment: Danger to Self:  No Self-injurious Behavior: No Danger to Others: No Duty to Warn:no Physical Aggression / Violence:No  Access to Firearms a concern: No  Gang Involvement:No  Treatment plan: Will employ cognitive behavioral therapy as well as  grief and loss therapy for relief of anxiety and grief.  Goals of grief therapy or to have a healthier response to the loss of his daughter and less sadness as evidenced by patient report in therapy notes as well as to help him feel less overwhelmed by her loss.  Goals are to see a 50% reduction in grief symptoms over the next 6 months.  I will encourage the use of the patient telling his grief story about his daughter including encouraging him to bring pictures and memorabilia to help process his feelings including any feelings of guilt associated with her loss.  We will use cognitive behavioral therapy to help identify and change anxiety producing thoughts and behavior patterns so as to improve his ability to better manage anxiety and stress, and manage thoughts and worrisome thinking contributing to anxiety.  We will also refresh and encourage dialectical behavior therapy distress tolerance and mindfulness skills with the intention of reducing anxiety by 50% over the next 6 months. Progress: 30% Interventions: Cognitive Behavioral Therapy  Diagnosis:PTSD, Bi-Polar D/O  Plan: F/U/ appointments scheduled weekly.  Cecile Coder, [facility]                                                                                                                   Cecile Coder,  [facility]  [facility] Counselor/Therapist Progress Note  Patient ID: Stephen Myers, MRN: [id number],    Date: 02/26/2024 This session was held via video teletherapy. The patient consented to the video teletherapy and was located in his home during this session. He is aware it is the responsibility of the patient to secure confidentiality on his end of the session. The provider was in a private home office for the duration of this session.    The patient arrived on time for her Caregility session  Time Spent: 58 minutes, 2 PM to 2:58 PM  Treatment Type: Individual Therapy Reported Symptoms: anxiety, panic attacks A fairly difficult week because it was his daughter's birthday.  He said this year was more  difficult than last year but he could not pinpoint why.  They did remember her about going out to a nice restaurant which is something her daughter always wanted to do.  He said they were able to laugh and joke about funny things that his daughter had said or done which is not something they were able to do this time last year so he saw that as healthy grieving.  He knows that the time change threw things off a little bit for everybody and his wife still is not feeling well and his back has been bothering him.  He was not able to go to his first round of physical therapy this week because his back was bothering him more.  He has been thinking a lot more about his daughter which he feels comfortable with.  He is thankful that it is getting warmer and staying light longer so he can get back to some of the coping skills such as fishing that he has done in the past.   We will continue to work on coping skills for helping with his anxiety and the progression of his Lewy body dementia diagnosis as well as processing his grief. Mental Status Exam: Appearance:  Well Groomed     Behavior: Appropriate  Motor: Pt. Reports some instability due to Lewey Body dementia  Speech/Language:  Normal Rate  Affect: Appropriate  Mood: normal  Thought process: normal  Thought content:   WNL  Sensory/Perceptual disturbances:   WNL  Orientation: oriented to person, place, and situation  Attention: Good  Concentration: Good  Memory: Impaired short term  Fund of knowledge:  Good  Insight:   Good  Judgment:  Good  Impulse Control: Good   Risk Assessment: Danger to Self:  No Self-injurious Behavior: No Danger to Others: No Duty to Warn:no Physical Aggression / Violence:No  Access to Firearms a concern: No  Gang Involvement:No  Treatment plan: Will employ cognitive behavioral therapy as well as grief and loss therapy for relief of anxiety and grief.  Goals of grief therapy or to have a healthier response to the loss  of his daughter and less sadness as evidenced by patient report in therapy notes as well as to help him feel less overwhelmed by her loss.  Goals are to see a 50% reduction in grief symptoms over the next 6 months.  I will encourage the use of the patient telling his grief story about his daughter including encouraging him to bring pictures and memorabilia to help process his feelings including any feelings of guilt associated with her loss.  We will use cognitive behavioral therapy to help identify and change anxiety producing thoughts and behavior patterns so as to improve his ability to better manage anxiety and stress, and manage thoughts  and worrisome thinking contributing to anxiety.  We will also refresh and encourage dialectical behavior therapy distress tolerance and mindfulness skills with the intention of reducing anxiety by 50% over the next 6 months. Progress: 30% Interventions: Cognitive Behavioral Therapy  Diagnosis:PTSD, Bi-Polar D/O  Plan: F/U/ appointments scheduled weekly.  Cecile Coder, [facility]                                                                                                                   Cecile Coder, [facility]               Cecile Coder, [facility]               Cecile Coder,  [facility]  [facility] Counselor/Therapist Progress Note  Patient ID: Stephen Myers, MRN: [id number],    Date: 02/26/2024 This session was held via video teletherapy. The patient consented to the video teletherapy and was located in his home during this session. He is aware it is the responsibility of the patient to secure confidentiality on his end of the session. The provider was in a private home office for the duration of this session.    The patient arrived on time for her Caregility session  Time Spent: 1:05 PM until 2 PM, 55 minutes Treatment Type: Individual Therapy Reported Symptoms: anxiety, panic attacks The patient said he came in from his walk this morning and saw what he thought was some sort of tic from his wife.  It  was not the first time it happened and he wonders if it might be tardive dyskinesia because of some new medications that she is on.  She has a call into the doctor right now and hopes to hear something back soon.  They have narrowed down to the fact they think most of her discomfort in breathing is coming from the diaphragm and they are looking for alternatives for treatment.  The patient continues to do those things which help him cope including walking, fishing when he can, spending time with his son and daughter-in-law and grandson.  He also has a way of continued coping with grief he is writing letters to his daughters as a way of expressing his feelings and says that is very beneficial.  He appears to be grieving in a healthy way but he knows that he to year anniversary of her death is coming up and there is some grief there but says it is not overwhelming.  We will continue to work on coping skills for helping with his anxiety and the progression of his Lewy body dementia diagnosis as well as processing his grief. Mental Status Exam: Appearance:  Well Groomed     Behavior: Appropriate  Motor: Pt. Reports some instability due to Lewey Body dementia  Speech/Language:  Normal Rate  Affect: Appropriate  Mood: normal  Thought process: normal  Thought content:   WNL  Sensory/Perceptual disturbances:   WNL  Orientation: oriented to person, place, and situation  Attention: Good  Concentration: Good  Memory: Impaired short term  Fund of knowledge:  Good  Insight:   Good  Judgment:  Good  Impulse Control: Good   Risk Assessment: Danger to Self:  No Self-injurious Behavior: No Danger to Others: No Duty to Warn:no Physical Aggression / Violence:No  Access to Firearms a concern: No  Gang Involvement:No  Treatment plan: Will employ cognitive behavioral therapy as well as grief and loss therapy for relief of anxiety and grief.  Goals of grief therapy or to have a healthier response to the loss  of his daughter and less sadness as evidenced by patient report in therapy notes as well as to help him feel less overwhelmed by her loss.  Goals are to see a 50% reduction in grief symptoms over the next 6 months.  I will encourage the use of the patient telling his grief story about his daughter including encouraging him to bring pictures and memorabilia to help process his feelings including any feelings of guilt associated with her loss.  We will use cognitive behavioral therapy to help identify and change anxiety producing thoughts and behavior patterns so as to improve his ability to better manage anxiety and stress, and manage thoughts and worrisome  thinking contributing to anxiety.  We will also refresh and encourage dialectical behavior therapy distress tolerance and mindfulness skills with the intention of reducing anxiety by 50% over the next 6 months. Progress: 30% Interventions: Cognitive Behavioral Therapy  Diagnosis:PTSD, Bi-Polar D/O  Plan: F/U/ appointments scheduled weekly.  Cecile Coder, [facility]                                                                                                                   Cecile Coder,  [facility]  [facility] Counselor/Therapist Progress Note  Patient ID: Stephen Myers, MRN: [id number],    Date: 02/26/2024 This session was held via video teletherapy. The patient consented to the video teletherapy and was located in his home during this session. He is aware it is the responsibility of the patient to secure confidentiality on his end of the session. The provider was in a private home office for the duration of this session.    The patient arrived on time for her Caregility session  Time Spent: 59 minutes, 2 PM to 2:59 PM  Treatment Type: Individual Therapy Reported Symptoms: anxiety, panic attacks The patient continues to present with physical pain and both his back and his knee.  He started  with a new physical therapist and told him that there was a lot of arthritis in both knees and or certain things he cannot do but they had him do that anyway and now for the past 3 days he has been in significant knee pain.  He has continued to walk because he knows that is good for him in various ways but says it has been painful.  His wife also was not feeling well but she is going back to the doctor next week to have an ablation and hopes that will bring her some relief.  For the most part he has been at the last couple weeks reaching back out and hearing from some old friends as well as time with his son and son's family.  He says that his wife's cousin is coming down for a weekend soon and they enjoy time with her and that he will go to his brother's house sometime in the next few weeks.  He recognizes the need for people that he cares about in helping with his depression. We will continue to work on coping skills for helping with his anxiety and the progression of his Lewy body dementia diagnosis as well as processing his grief. Mental Status Exam: Appearance:  Well Groomed     Behavior: Appropriate  Motor: Pt. Reports some instability due to Lewey Body dementia  Speech/Language:  Normal Rate  Affect: Appropriate  Mood: normal  Thought process: normal  Thought content:   WNL  Sensory/Perceptual disturbances:   WNL  Orientation: oriented to person, place, and situation  Attention: Good  Concentration: Good  Memory: Impaired short term  Fund of knowledge:  Good  Insight:   Good  Judgment:  Good  Impulse Control: Good   Risk Assessment: Danger to Self:  No Self-injurious Behavior: No Danger to Others: No Duty to Warn:no Physical Aggression / Violence:No  Access to Firearms a concern: No  Gang Involvement:No  Treatment plan: Will employ cognitive behavioral therapy as well as grief and loss therapy for relief of anxiety and grief.  Goals of grief therapy or to have a healthier response  to the loss of his daughter and less sadness as evidenced by patient report in therapy notes as well as to help him feel less overwhelmed by her loss.  Goals are to see a 50% reduction in grief symptoms over the next 6 months.  I will encourage the use of the patient telling his grief story about his daughter including encouraging him to bring pictures and memorabilia to help process his feelings including any feelings of guilt associated with her loss.  We will use cognitive behavioral therapy to help identify and change anxiety producing thoughts and behavior patterns so as to improve his ability to better  manage anxiety and stress, and manage thoughts and worrisome thinking contributing to anxiety.  We will also refresh and encourage dialectical behavior therapy distress tolerance and mindfulness skills with the intention of reducing anxiety by 50% over the next 6 months. Progress: 30% Interventions: Cognitive Behavioral Therapy  Diagnosis:PTSD, Bi-Polar D/O  Plan: F/U/ appointments scheduled weekly.  Cecile Coder, [facility]                                                                                                                   Cecile Coder, [facility]               Cecile Coder, [facility]               Cecile Coder, [facility]               Cecile Coder,  [facility]  [facility] Counselor/Therapist Progress Note  Patient ID: Stephen Myers, MRN: [id number],    Date: 02/26/2024 This session was held via video teletherapy. The patient consented to the video teletherapy and was located in his home during this session. He is aware it is the responsibility of the patient to secure confidentiality on his end of the session. The provider was in a private home office for the duration of this session.    The patient arrived on time for her Caregility session  Time Spent: 57 minutes, 2 PM to 2:57 PM  Treatment Type: Individual Therapy Reported Symptoms: anxiety, panic attacks The patient is still having significant back pain.  He cannot sit anywhere for very long and has to be  careful where he sits.  There is still some unsteadiness although he did not report any falls over the past 2 weeks.  His biggest concern now is for his wife.  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  She is meeting with her cardiologist again next week to see what possible treatment is for her.  He still thinks there may be some issue with her diaphragm but she is meeting with the pulmonologist also.  He is doing what he can to help her.  His daughter's birthday is March 11 so he knows that is a difficult time for both he and his wife next week.  Typically they do something to remember her but because of the wife's current health issues they cannot go to [place] as they had planned so they are talking about what to do to remember and celebrate her birthday.  We will continue to work on coping skills for helping with his anxiety and the progression of his Lewy body dementia diagnosis as well as processing his grief. Mental Status Exam: Appearance:  Well Groomed     Behavior: Appropriate  Motor: Pt. Reports some instability due to Lewey Body dementia  Speech/Language:  Normal Rate  Affect: Appropriate  Mood: normal  Thought process: normal  Thought content:   WNL  Sensory/Perceptual disturbances:   WNL  Orientation: oriented to person, place, and situation  Attention: Good  Concentration: Good  Memory: Impaired short term  Fund of knowledge:  Good  Insight:   Good  Judgment:  Good  Impulse Control: Good   Risk Assessment: Danger to Self:  No Self-injurious Behavior: No Danger to Others: No Duty to Warn:no Physical Aggression / Violence:No  Access to Firearms a concern: No  Gang Involvement:No  Treatment plan: Will employ cognitive behavioral therapy as well as grief and loss therapy for relief of anxiety and grief.  Goals of grief therapy or to have a healthier response to the loss of his daughter  and less sadness as evidenced by patient report in therapy notes as well as to help him feel less overwhelmed by her loss.  Goals are to see a 50% reduction in grief symptoms over the next 6 months.  I will encourage the use of the patient telling his grief story about his daughter including encouraging him to bring pictures and memorabilia to help process his feelings including any feelings of guilt associated with her loss.  We will use cognitive behavioral therapy to help identify and change anxiety producing thoughts and behavior patterns so as to improve his ability to better manage anxiety and stress, and manage thoughts and worrisome thinking contributing  to anxiety.  We will also refresh and encourage dialectical behavior therapy distress tolerance and mindfulness skills with the intention of reducing anxiety by 50% over the next 6 months. Progress: 30% Interventions: Cognitive Behavioral Therapy  Diagnosis:PTSD, Bi-Polar D/O  Plan: F/U/ appointments scheduled weekly.  Cecile Coder, [facility]                                                                                                                   Cecile Coder,  [facility]  [place] Counselor/Therapist Progress Note  Patient ID: Stephen Myers, MRN: [id number],    Date: 02/26/2024 This session was held via video teletherapy. The patient consented to the video teletherapy and was located in his home during this session. He is aware it is the responsibility of the patient to secure confidentiality on his end of the session. The provider was in a private home office for the duration of this session.    The patient arrived on time for her Caregility session  Time Spent: 58 minutes, 2 PM to 2:58 PM  Treatment Type: Individual Therapy Reported Symptoms: anxiety, panic attacks A fairly difficult week because it was his daughter's birthday.  He said this year was more  difficult than last year but he could not pinpoint why.  They did remember her about going out to a nice restaurant which is something her daughter always wanted to do.  He said they were able to laugh and joke about funny things that his daughter had said or done which is not something they were able to do this time last year so he saw that as healthy grieving.  He knows that the time change threw things off a little bit for everybody and his wife still is not feeling well and his back has been bothering him.  He was not able to go to his first round of physical therapy this week because his back was bothering him more.  He has been thinking a lot more about his daughter which he feels comfortable with.  He is thankful that it is getting warmer and staying light longer so he can get back to some of the coping skills such as fishing that he has done in the past.   We will continue to work on coping skills for helping with his anxiety and the progression of his Lewy body dementia diagnosis as well as processing his grief. Mental Status Exam: Appearance:  Well Groomed     Behavior: Appropriate  Motor: Pt. Reports some instability due to Lewey Body dementia  Speech/Language:  Normal Rate  Affect: Appropriate  Mood: normal  Thought process: normal  Thought content:   WNL  Sensory/Perceptual disturbances:   WNL  Orientation: oriented to person, place, and situation  Attention: Good  Concentration: Good  Memory: Impaired short term  Fund of knowledge:  Good  Insight:   Good  Judgment:  Good  Impulse Control: Good   Risk Assessment: Danger to Self:  No Self-injurious Behavior: No Danger to Others: No Duty to Warn:no Physical Aggression / Violence:No  Access to Firearms a concern: No  Gang Involvement:No  Treatment plan: Will employ cognitive behavioral therapy as well as grief and loss therapy for relief of anxiety and grief.  Goals of grief therapy or to have a healthier response to the loss  of his daughter and less sadness as evidenced by patient report in therapy notes as well as to help him feel less overwhelmed by her loss.  Goals are to see a 50% reduction in grief symptoms over the next 6 months.  I will encourage the use of the patient telling his grief story about his daughter including encouraging him to bring pictures and memorabilia to help process his feelings including any feelings of guilt associated with her loss.  We will use cognitive behavioral therapy to help identify and change anxiety producing thoughts and behavior patterns so as to improve his ability to better manage anxiety and stress, and manage thoughts  and worrisome thinking contributing to anxiety.  We will also refresh and encourage dialectical behavior therapy distress tolerance and mindfulness skills with the intention of reducing anxiety by 50% over the next 6 months. Progress: 30% Interventions: Cognitive Behavioral Therapy  Diagnosis:PTSD, Bi-Polar D/O  Plan: F/U/ appointments scheduled weekly.  Cecile Coder, [facility] [facility]                                                                                                                   Cecile Coder, [facility]               Cecile Coder, [facility]               Cecile Coder,  [facility]  [facility] Counselor/Therapist Progress Note  Patient ID: Stephen Myers, MRN: [id number],    Date: 02/26/2024 This session was held via video teletherapy. The patient consented to the video teletherapy and was located in his home during this session. He is aware it is the responsibility of the patient to secure confidentiality on his end of the session. The provider was in a private home office for the duration of this session.    The patient arrived on time for her Caregility session  Time Spent: 57 minutes, 2 PM to 2:57 PM  Treatment Type: Individual Therapy Reported Symptoms: anxiety, panic attacks The patient is still having significant back pain.  He cannot sit anywhere for very long and has to be  careful where he sits.  There is still some unsteadiness although he did not report any falls over the past 2 weeks.  His biggest concern now is for his wife.  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  She is meeting with her cardiologist again next week to see what possible treatment is for her.  He still thinks there may be some issue with her diaphragm but she is meeting with the pulmonologist also.  He is doing what he can to help her.  His daughter's birthday is March 11 so he knows that is a difficult time for both he and his wife next week.  Typically they do something to remember her but because of the wife's current health issues they cannot go to [place] as they had planned so they are talking about what to do to remember and celebrate her birthday.  We will continue to work on coping skills for helping with his anxiety and the progression of his Lewy body dementia diagnosis as well as processing his grief. Mental Status Exam: Appearance:  Well Groomed     Behavior: Appropriate  Motor: Pt. Reports some instability due to Lewey Body dementia  Speech/Language:  Normal Rate  Affect: Appropriate  Mood: normal  Thought process: normal  Thought content:   WNL  Sensory/Perceptual disturbances:   WNL  Orientation: oriented to person, place, and situation  Attention: Good  Concentration: Good  Memory: Impaired short term  Fund of knowledge:  Good  Insight:   Good  Judgment:  Good  Impulse Control: Good   Risk Assessment: Danger to Self:  No Self-injurious Behavior: No Danger to Others: No Duty to Warn:no Physical Aggression / Violence:No  Access to Firearms a concern: No  Gang Involvement:No  Treatment plan: Will employ cognitive behavioral therapy as well as grief and loss therapy for relief of anxiety and grief.  Goals of grief therapy or to have a healthier response to the loss of his daughter  and less sadness as evidenced by patient report in therapy notes as well as to help him feel less overwhelmed by her loss.  Goals are to see a 50% reduction in grief symptoms over the next 6 months.  I will encourage the use of the patient telling his grief story about his daughter including encouraging him to bring pictures and memorabilia to help process his feelings including any feelings of guilt associated with her loss.  We will use cognitive behavioral therapy to help identify and change anxiety producing thoughts and behavior patterns so as to improve his ability to better manage anxiety and stress, and manage thoughts and worrisome thinking contributing  to anxiety.  We will also refresh and encourage dialectical behavior therapy distress tolerance and mindfulness skills with the intention of reducing anxiety by 50% over the next 6 months. Progress: 30% Interventions: Cognitive Behavioral Therapy  Diagnosis:PTSD, Bi-Polar D/O  Plan: F/U/ appointments scheduled weekly.  Cecile Coder, [facility]                                                                                                                   Cecile Coder,  [facility]  [facility] Counselor/Therapist Progress Note  Patient ID: Stephen Myers, MRN: [id number],    Date: 02/26/2024 This session was held via video teletherapy. The patient consented to the video teletherapy and was located in his home during this session. He is aware it is the responsibility of the patient to secure confidentiality on his end of the session. The provider was in a private home office for the duration of this session.    The patient arrived on time for her Caregility session  Time Spent: 57 minutes, 2 PM to 2:57 PM  Treatment Type: Individual Therapy Reported Symptoms: anxiety, panic attacks  We will continue to work on coping skills for helping with his anxiety and the progression of  his Lewy body dementia diagnosis as well as processing his grief. Mental Status Exam: Appearance:  Well Groomed     Behavior: Appropriate  Motor: Pt. 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  Speech/Language:  Normal Rate  Affect: Appropriate  Mood: normal  Thought process: normal  Thought content:   WNL  Sensory/Perceptual disturbances:   WNL  Orientation: oriented to person, place, and situation  Attention: Good  Concentration: Good  Memory: Impaired short term  Fund of knowledge:  Good  Insight:   Good  Judgment:  Good  Impulse Control: Good   Risk Assessment: Danger to Self:  No Self-injurious Behavior: No Danger to Others: No Duty to Warn:no Physical Aggression / Violence:No  Access to Firearms a concern: No  Gang Involvement:No  Treatment plan: Will employ cognitive behavioral therapy as well as grief and loss therapy for relief of anxiety and grief.  Goals of grief therapy or to have a healthier response to the loss of his daughter and less sadness as evidenced by patient report in therapy notes as well as to help him feel less overwhelmed by her loss.  Goals are to see a 50% reduction in grief symptoms over the next 6 months.  I will encourage the use of the patient telling his grief story about his daughter including encouraging him to bring pictures and memorabilia to help process his feelings including any feelings of guilt associated with her loss.  We will use cognitive behavioral therapy to help identify and change anxiety producing thoughts and behavior patterns so as to improve his ability to better manage anxiety and stress, and manage thoughts and worrisome thinking contributing to anxiety.  We will also refresh and encourage dialectical behavior therapy distress tolerance and mindfulness skills with the intention of reducing anxiety by 50% over the next 6 months. Progress: 30% Interventions: Cognitive Behavioral Therapy  Diagnosis:PTSD, Bi-Polar  D/O  Plan: F/U/ appointments scheduled weekly.  Cecile Coder, [facility]                                                                                                                   Cecile Coder, [facility]               Cecile Coder, [facility]  Cecile Coder, [facility]               Cecile Coder, [facility]               Cecile Coder, [facility]               Cecile Coder, [facility]               Cecile Coder, [facility]  [place] [facility] Counselor/Therapist Progress Note  Patient ID: Stephen Myers, MRN: [id number],    Date:  02/26/2024 This session was held via video teletherapy. The patient consented to the video teletherapy and was located in his home during this session. He is aware it is the responsibility of the patient to secure confidentiality on his end of the session. The provider was in a private home office for the duration of this session.    The patient arrived on time for her Caregility session  Time Spent: 39 minutes, 2 PM to 2:39 PM  Treatment Type: Individual Therapy Reported Symptoms: anxiety, panic attacks The patient was not feeling well today.  He started feeling really earlier in the week having a fever bad headaches body aches.  He felt a little better yesterday but feels a little bit of a resurgence in headaches and body aches and is running a low-grade fever.  His wife is feeling some of the same symptoms.  For the most part he says he is doing well.  They did increase his memory medication because they were starting to see some increasing decline in short-term memory and he said until recently his long-term memory had been good but he was starting to see a difference in how he used to markers to remember what happened at certain times historically.  He feels the medication has helped and has brightened his mood a little also.  He is still trying to walk every day and fish when he can and has enjoyed watching the [employer] as a good distraction.  He reports that he feels he is managing mood fairly well.  He has an appointment in 2 weeks.  He does contract for safety having no thoughts of hurting himself or anyone else. We will continue to work on coping skills for helping with his anxiety and the progression of his Lewy body dementia diagnosis as well as processing his grief. Mental Status Exam: Appearance:  Well Groomed     Behavior: Appropriate  Motor: Pt. Reports some instability due to Lewey Body dementia  Speech/Language:  Normal Rate  Affect: Appropriate  Mood: normal   Thought process: normal  Thought content:   WNL  Sensory/Perceptual disturbances:   WNL  Orientation: oriented to person, place, and situation  Attention: Good  Concentration: Good  Memory: Impaired short term  Fund of knowledge:  Good  Insight:   Good  Judgment:  Good  Impulse Control: Good   Risk Assessment: Danger to Self:  No Self-injurious Behavior: No Danger to Others: No Duty to Warn:no Physical Aggression / Violence:No  Access to Firearms a concern: No  Gang Involvement:No  Treatment plan: Will employ cognitive behavioral therapy as well as grief and loss therapy for relief of anxiety and grief.  Goals of grief therapy or to have a healthier response to the loss of his daughter and less sadness as evidenced by patient report in therapy notes as well as to help him feel less overwhelmed by her loss.  Goals are to see a 50% reduction in grief symptoms over the next 6 months.  I will encourage the use of the patient telling his grief story about his daughter including encouraging him to bring pictures and memorabilia to help process his feelings including any feelings of guilt associated with her loss.  We will use cognitive behavioral therapy to help identify and change anxiety producing thoughts and behavior patterns so as to improve his ability to better manage anxiety  and stress, and manage thoughts and worrisome thinking contributing to anxiety.  We will also refresh and encourage dialectical behavior therapy distress tolerance and mindfulness skills with the intention of reducing anxiety by 50% over the next 6 months. Progress: 30% Interventions: Cognitive Behavioral Therapy  Diagnosis:PTSD, Bi-Polar D/O  Plan: F/U/ appointments scheduled weekly.  Cecile Coder,  [facility]                                                                                                                   Cecile Coder, [facility]  [place] [facility] Counselor/Therapist Progress Note  Patient ID: Stephen Myers, MRN: [id number],    Date: 02/26/2024 This session was held via video teletherapy. The patient consented to the video teletherapy and was located in his home during this session. He is aware it is the responsibility of the patient to secure confidentiality on his end of the session. The provider was in a private home office for the duration of this session.    The patient arrived on  time for her Caregility session  Time Spent: 58 minutes, 2 PM to 2:58 PM  Treatment Type: Individual Therapy Reported Symptoms: anxiety, panic attacks A fairly difficult week because it was his daughter's birthday.  He said this year was more difficult than last year but he could not pinpoint why.  They did remember her about going out to a nice restaurant which is something her daughter always wanted to do.  He said they were able to laugh and joke about funny things that his daughter had said or done which is not something they were able to do this time last year so he saw that as healthy grieving.  He knows that the time change threw things off a little bit for everybody and his wife still is not feeling well and his back has been bothering him.  He was not able to go to his first round of physical therapy this week because his back was bothering him more.  He has been thinking a lot more about his daughter which he feels comfortable with.  He is thankful that it is getting warmer and staying light longer so he can get back to some of the coping skills such as fishing that he has done in the past.   We will continue to work on coping skills for helping with his anxiety and the progression of his Lewy body dementia diagnosis as well as processing his grief. Mental Status Exam: Appearance:  Well Groomed     Behavior: Appropriate  Motor: Pt. Reports some instability due to Lewey Body dementia  Speech/Language:  Normal Rate  Affect: Appropriate  Mood: normal  Thought process: normal  Thought content:   WNL  Sensory/Perceptual disturbances:   WNL  Orientation: oriented to person, place, and situation  Attention: Good  Concentration: Good  Memory: Impaired short term  Fund of knowledge:  Good  Insight:   Good  Judgment:  Good  Impulse Control: Good   Risk Assessment: Danger to Self:  No Self-injurious Behavior: No Danger to Others: No Duty to Warn:no Physical Aggression / Violence:No   Access to Firearms a concern: No  Gang Involvement:No  Treatment plan: Will employ cognitive behavioral therapy as well as grief and loss therapy for relief of anxiety and grief.  Goals of grief therapy or to have a healthier response to the loss of his daughter and less sadness as evidenced by patient report in therapy notes as well as to help him feel less overwhelmed by her loss.  Goals are to see a 50% reduction in grief symptoms over the next 6 months.  I will encourage the use of the patient telling his grief story about his daughter including encouraging him to bring pictures and memorabilia to help process his feelings including any feelings of guilt associated with her loss.  We will use cognitive behavioral therapy to help identify and change anxiety producing thoughts and behavior patterns so as to improve his ability to better manage anxiety and stress, and manage thoughts  and worrisome thinking contributing to anxiety.  We will also refresh and encourage dialectical behavior therapy distress tolerance and mindfulness skills with the intention of reducing anxiety by 50% over the next 6 months. Progress: 30% Interventions: Cognitive Behavioral Therapy  Diagnosis:PTSD, Bi-Polar D/O  Plan: F/U/ appointments scheduled weekly.  Cecile Coder, [facility]                                                                                                                   Cecile Coder, [facility]               Cecile Coder, [facility]               Cecile Coder,  [facility]  [place] [facility] Counselor/Therapist Progress Note  Patient ID: Pernell Lenoir, MRN: [id number],    Date: 02/26/2024 This session was held via video teletherapy. The patient consented to the video teletherapy and was located in his home during this session. He is aware it is the responsibility of the patient to secure confidentiality on his end of the session. The provider was in a private home office for the duration of this session.    The patient arrived on time for her Caregility session  Time Spent: 1:05 PM until 2 PM, 55 minutes Treatment Type: Individual Therapy Reported Symptoms: anxiety, panic attacks The patient said he came in from his walk this morning and saw what he thought was some sort of tic from his wife.  It  was not the first time it happened and he wonders if it might be tardive dyskinesia because of some new medications that she is on.  She has a call into the doctor right now and hopes to hear something back soon.  They have narrowed down to the fact they think most of her discomfort in breathing is coming from the diaphragm and they are looking for alternatives for treatment.  The patient continues to do those things which help him cope including walking, fishing when he can, spending time with his son and daughter-in-law and grandson.  He also has a way of continued coping with grief he is writing letters to his daughters as a way of expressing his feelings and says that is very beneficial.  He appears to be grieving in a healthy way but he knows that he to year anniversary of her death is coming up and there is some grief there but says it is not overwhelming.  We will continue to work on coping skills for helping with his anxiety and the progression of his Lewy body dementia diagnosis as well as processing his grief. Mental Status Exam: Appearance:  Well Groomed     Behavior: Appropriate  Motor: Pt. Reports some instability due to Lewey Body dementia  Speech/Language:  Normal Rate  Affect: Appropriate  Mood: normal  Thought process: normal  Thought content:   WNL  Sensory/Perceptual disturbances:   WNL  Orientation: oriented to person, place, and situation  Attention: Good  Concentration: Good  Memory: Impaired short term  Fund of knowledge:  Good  Insight:   Good  Judgment:  Good  Impulse Control: Good   Risk Assessment: Danger to Self:  No Self-injurious Behavior: No Danger to Others: No Duty to Warn:no Physical Aggression / Violence:No  Access to Firearms a concern: No  Gang Involvement:No  Treatment plan: Will employ cognitive behavioral therapy as well as grief and loss therapy for relief of anxiety and grief.  Goals of grief therapy or to have a healthier response to the loss  of his daughter and less sadness as evidenced by patient report in therapy notes as well as to help him feel less overwhelmed by her loss.  Goals are to see a 50% reduction in grief symptoms over the next 6 months.  I will encourage the use of the patient telling his grief story about his daughter including encouraging him to bring pictures and memorabilia to help process his feelings including any feelings of guilt associated with her loss.  We will use cognitive behavioral therapy to help identify and change anxiety producing thoughts and behavior patterns so as to improve his ability to better manage anxiety and stress, and manage thoughts and worrisome  thinking contributing to anxiety.  We will also refresh and encourage dialectical behavior therapy distress tolerance and mindfulness skills with the intention of reducing anxiety by 50% over the next 6 months. Progress: 30% Interventions: Cognitive Behavioral Therapy  Diagnosis:PTSD, Bi-Polar D/O  Plan: F/U/ appointments scheduled weekly.  Cecile Coder, [facility]                                                                                                                   Cecile Coder,  [facility]  [place] [facility] Counselor/Therapist Progress Note  Patient ID: Stephen Myers, MRN: [id number],    Date: 02/26/2024 This session was held via video teletherapy. The patient consented to the video teletherapy and was located in his home during this session. He is aware it is the responsibility of the patient to secure confidentiality on his end of the session. The provider was in a private home office for the duration of this session.    The patient arrived on time for her Caregility session  Time Spent: 59 minutes, 2 PM to 2:59 PM  Treatment Type: Individual Therapy Reported Symptoms: anxiety, panic attacks The patient continues to present with physical pain and both his back and his knee.  He started  with a new physical therapist and told him that there was a lot of arthritis in both knees and or certain things he cannot do but they had him do that anyway and now for the past 3 days he has been in significant knee pain.  He has continued to walk because he knows that is good for him in various ways but says it has been painful.  His wife also was not feeling well but she is going back to the doctor next week to have an ablation and hopes that will bring her some relief.  For the most part he has been at the last couple weeks reaching back out and hearing from some old friends as well as time with his son and son's family.  He says that his wife's cousin is coming down for a weekend soon and they enjoy time with her and that he will go to his brother's house sometime in the next few weeks.  He recognizes the need for people that he cares about in helping with his depression. We will continue to work on coping skills for helping with his anxiety and the progression of his Lewy body dementia diagnosis as well as processing his grief. Mental Status Exam: Appearance:  Well Groomed     Behavior: Appropriate  Motor: Pt. Reports some instability due to Lewey Body dementia  Speech/Language:  Normal Rate  Affect: Appropriate  Mood: normal  Thought process: normal  Thought content:   WNL  Sensory/Perceptual disturbances:   WNL  Orientation: oriented to person, place, and situation  Attention: Good  Concentration: Good  Memory: Impaired short term  Fund of knowledge:  Good  Insight:   Good  Judgment:  Good  Impulse Control: Good   Risk Assessment: Danger to Self:  No Self-injurious Behavior: No Danger to Others: No Duty to Warn:no Physical Aggression / Violence:No  Access to Firearms a concern: No  Gang Involvement:No  Treatment plan: Will employ cognitive behavioral therapy as well as grief and loss therapy for relief of anxiety and grief.  Goals of grief therapy or to have a healthier response  to the loss of his daughter and less sadness as evidenced by patient report in therapy notes as well as to help him feel less overwhelmed by her loss.  Goals are to see a 50% reduction in grief symptoms over the next 6 months.  I will encourage the use of the patient telling his grief story about his daughter including encouraging him to bring pictures and memorabilia to help process his feelings including any feelings of guilt associated with her loss.  We will use cognitive behavioral therapy to help identify and change anxiety producing thoughts and behavior patterns so as to improve his ability to better  manage anxiety and stress, and manage thoughts and worrisome thinking contributing to anxiety.  We will also refresh and encourage dialectical behavior therapy distress tolerance and mindfulness skills with the intention of reducing anxiety by 50% over the next 6 months. Progress: 30% Interventions: Cognitive Behavioral Therapy  Diagnosis:PTSD, Bi-Polar D/O  Plan: F/U/ appointments scheduled weekly.  Cecile Coder, [facility]                                                                                                                   Cecile Coder, [facility]               Cecile Coder, [facility]               Cecile Coder, [facility]               Cecile Coder,  [facility]  [place] [facility] Counselor/Therapist Progress Note  Patient ID: Stephen Myers, MRN: [id number],    Date: 02/26/2024 This session was held via video teletherapy. The patient consented to the video teletherapy and was located in his home during this session. He is aware it is the responsibility of the patient to secure confidentiality on his end of the session. The provider was in a private home office for the duration of this session.    The patient arrived on time for her Caregility session  Time Spent: 57 minutes, 2 PM to 2:57 PM  Treatment Type: Individual Therapy Reported Symptoms: anxiety, panic attacks The patient is still having significant back pain.  He cannot sit anywhere for very long and has to be  careful where he sits.  There is still some unsteadiness although he did not report any falls over the past 2 weeks.  His biggest concern now is for his wife.  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  She is meeting with her cardiologist again next week to see what possible treatment is for her.  He still thinks there may be some issue with her diaphragm but she is meeting with the pulmonologist also.  He is doing what he can to help her.  His daughter's birthday is March 11 so he knows that is a difficult time for both he and his wife next week.  Typically they do something to remember her but because of the wife's current health issues they cannot go to [place] as they had planned so they are talking about what to do to remember and celebrate her birthday.  We will continue to work on coping skills for helping with his anxiety and the progression of his Lewy body dementia diagnosis as well as processing his grief. Mental Status Exam: Appearance:  Well Groomed     Behavior: Appropriate  Motor: Pt. 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  Speech/Language:  Normal Rate  Affect: Appropriate  Mood: normal  Thought process: normal  Thought content:   WNL  Sensory/Perceptual disturbances:   WNL  Orientation: oriented to person, place, and situation  Attention: Good  Concentration: Good  Memory: Impaired short term  Fund of knowledge:  Good  Insight:   Good  Judgment:  Good  Impulse Control: Good   Risk Assessment: Danger to Self:  No Self-injurious Behavior: No Danger to Others: No Duty to Warn:no Physical Aggression / Violence:No  Access to Firearms a concern: No  Gang Involvement:No  Treatment plan: Will employ cognitive behavioral therapy as well as grief and loss therapy for relief of anxiety and grief.  Goals of grief therapy or to have a healthier response to the loss of his daughter  and less sadness as evidenced by patient report in therapy notes as well as to help him feel less overwhelmed by her loss.  Goals are to see a 50% reduction in grief symptoms over the next 6 months.  I will encourage the use of the patient telling his grief story about his daughter including encouraging him to bring pictures and memorabilia to help process his feelings including any feelings of guilt associated with her loss.  We will use cognitive behavioral therapy to help identify and change anxiety producing thoughts and behavior patterns so as to improve his ability to better manage anxiety and stress, and manage thoughts and worrisome thinking contributing  to anxiety.  We will also refresh and encourage dialectical behavior therapy distress tolerance and mindfulness skills with the intention of reducing anxiety by 50% over the next 6 months. Progress: 30% Interventions: Cognitive Behavioral Therapy  Diagnosis:PTSD, Bi-Polar D/O  Plan: F/U/ appointments scheduled weekly.  Cecile Coder, [facility]                                                                                                                   Cecile Coder,  [facility]  [facility] Counselor/Therapist Progress Note  Patient ID: Stephen Myers, MRN: [id number],    Date: 02/26/2024 This session was held via video teletherapy. The patient consented to the video teletherapy and was located in his home during this session. He is aware it is the responsibility of the patient to secure confidentiality on his end of the session. The provider was in a private home office for the duration of this session.    The patient arrived on time for her Caregility session  Time Spent: 58 minutes, 2 PM to 2:58 PM  Treatment Type: Individual Therapy Reported Symptoms: anxiety, panic attacks A fairly difficult week because it was his daughter's birthday.  He said this year was more  difficult than last year but he could not pinpoint why.  They did remember her about going out to a nice restaurant which is something her daughter always wanted to do.  He said they were able to laugh and joke about funny things that his daughter had said or done which is not something they were able to do this time last year so he saw that as healthy grieving.  He knows that the time change threw things off a little bit for everybody and his wife still is not feeling well and his back has been bothering him.  He was not able to go to his first round of physical therapy this week because his back was bothering him more.  He has been thinking a lot more about his daughter which he feels comfortable with.  He is thankful that it is getting warmer and staying light longer so he can get back to some of the coping skills such as fishing that he has done in the past.   We will continue to work on coping skills for helping with his anxiety and the progression of his Lewy body dementia diagnosis as well as processing his grief. Mental Status Exam: Appearance:  Well Groomed     Behavior: Appropriate  Motor: Pt. 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  Speech/Language:  Normal Rate  Affect: Appropriate  Mood: normal  Thought process: normal  Thought content:   WNL  Sensory/Perceptual disturbances:   WNL  Orientation: oriented to person, place, and situation  Attention: Good  Concentration: Good  Memory: Impaired short term  Fund of knowledge:  Good  Insight:   Good  Judgment:  Good  Impulse Control: Good   Risk Assessment: Danger to Self:  No Self-injurious Behavior: No Danger to Others: No Duty to Warn:no Physical Aggression / Violence:No  Access to Firearms a concern: No  Gang Involvement:No  Treatment plan: Will employ cognitive behavioral therapy as well as grief and loss therapy for relief of anxiety and grief.  Goals of grief therapy or to have a healthier response to the loss  of his daughter and less sadness as evidenced by patient report in therapy notes as well as to help him feel less overwhelmed by her loss.  Goals are to see a 50% reduction in grief symptoms over the next 6 months.  I will encourage the use of the patient telling his grief story about his daughter including encouraging him to bring pictures and memorabilia to help process his feelings including any feelings of guilt associated with her loss.  We will use cognitive behavioral therapy to help identify and change anxiety producing thoughts and behavior patterns so as to improve his ability to better manage anxiety and stress, and manage thoughts  and worrisome thinking contributing to anxiety.  We will also refresh and encourage dialectical behavior therapy distress tolerance and mindfulness skills with the intention of reducing anxiety by 50% over the next 6 months. Progress: 30% Interventions: Cognitive Behavioral Therapy  Diagnosis:PTSD, Bi-Polar D/O  Plan: F/U/ appointments scheduled weekly.  Cecile Coder, [facility]                                                                                                                   Cecile Coder, [facility]               Cecile Coder, [facility]               Cecile Coder,  [facility]  [facility] Counselor/Therapist Progress Note  Patient ID: Stephen Myers, MRN: [id number],    Date: 02/26/2024 This session was held via video teletherapy. The patient consented to the video teletherapy and was located in his home during this session. He is aware it is the responsibility of the patient to secure confidentiality on his end of the session. The provider was in a private home office for the duration of this session.    The patient arrived on time for her Caregility session  Time Spent: 57 minutes, 2 PM to 2:57 PM  Treatment Type: Individual Therapy Reported Symptoms: anxiety, panic attacks The patient is still having significant back pain.  He cannot sit anywhere for very long and has to be  careful where he sits.  There is still some unsteadiness although he did not report any falls over the past 2 weeks.  His biggest concern now is for his wife.  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  She is meeting with her cardiologist again next week to see what possible treatment is for her.  He still thinks there may be some issue with her diaphragm but she is meeting with the pulmonologist also.  He is doing what he can to help her.  His daughter's birthday is March 11 so he knows that is a difficult time for both he and his wife next week.  Typically they do something to remember her but because of the wife's current health issues they cannot go to [place] as they had planned so they are talking about what to do to remember and celebrate her birthday.  We will continue to work on coping skills for helping with his anxiety and the progression of his Lewy body dementia diagnosis as well as processing his grief. Mental Status Exam: Appearance:  Well Groomed     Behavior: Appropriate  Motor: Pt. 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  Speech/Language:  Normal Rate  Affect: Appropriate  Mood: normal  Thought process: normal  Thought content:   WNL  Sensory/Perceptual disturbances:   WNL  Orientation: oriented to person, place, and situation  Attention: Good  Concentration: Good  Memory: Impaired short term  Fund of knowledge:  Good  Insight:   Good  Judgment:  Good  Impulse Control: Good   Risk Assessment: Danger to Self:  No Self-injurious Behavior: No Danger to Others: No Duty to Warn:no Physical Aggression / Violence:No  Access to Firearms a concern: No  Gang Involvement:No  Treatment plan: Will employ cognitive behavioral therapy as well as grief and loss therapy for relief of anxiety and grief.  Goals of grief therapy or to have a healthier response to the loss of his daughter  and less sadness as evidenced by patient report in therapy notes as well as to help him feel less overwhelmed by her loss.  Goals are to see a 50% reduction in grief symptoms over the next 6 months.  I will encourage the use of the patient telling his grief story about his daughter including encouraging him to bring pictures and memorabilia to help process his feelings including any feelings of guilt associated with her loss.  We will use cognitive behavioral therapy to help identify and change anxiety producing thoughts and behavior patterns so as to improve his ability to better manage anxiety and stress, and manage thoughts and worrisome thinking contributing  to anxiety.  We will also refresh and encourage dialectical behavior therapy distress tolerance and mindfulness skills with the intention of reducing anxiety by 50% over the next 6 months. Progress: 30% Interventions: Cognitive Behavioral Therapy  Diagnosis:PTSD, Bi-Polar D/O  Plan: F/U/ appointments scheduled weekly.  Cecile Coder, [facility]                                                                                                                   Cecile Coder,  [facility]  [facility] Counselor/Therapist Progress Note  Patient ID: Stephen Myers, MRN: [id number],    Date: 02/26/2024 This session was held via video teletherapy. The patient consented to the video teletherapy and was located in his home during this session. He is aware it is the responsibility of the patient to secure confidentiality on his end of the session. The provider was in a private home office for the duration of this session.    The patient arrived on time for her Caregility session  Time Spent: 57 minutes, 2 PM to 2:57 PM  Treatment Type: Individual Therapy Reported Symptoms: anxiety, panic attacks  We will continue to work on coping skills for helping with his anxiety and the progression of  his Lewy body dementia diagnosis as well as processing his grief. Mental Status Exam: Appearance:  Well Groomed     Behavior: Appropriate  Motor: Pt. 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  Speech/Language:  Normal Rate  Affect: Appropriate  Mood: normal  Thought process: normal  Thought content:   WNL  Sensory/Perceptual disturbances:   WNL  Orientation: oriented to person, place, and situation  Attention: Good  Concentration: Good  Memory: Impaired short term  Fund of knowledge:  Good  Insight:   Good  Judgment:  Good  Impulse Control: Good   Risk Assessment: Danger to Self:  No Self-injurious Behavior: No Danger to Others: No Duty to Warn:no Physical Aggression / Violence:No  Access to Firearms a concern: No  Gang Involvement:No  Treatment plan: Will employ cognitive behavioral therapy as well as grief and loss therapy for relief of anxiety and grief.  Goals of grief therapy or to have a healthier response to the loss of his daughter and less sadness as evidenced by patient report in therapy notes as well as to help him feel less overwhelmed by her loss.  Goals are to see a 50% reduction in grief symptoms over the next 6 months.  I will encourage the use of the patient telling his grief story about his daughter including encouraging him to bring pictures and memorabilia to help process his feelings including any feelings of guilt associated with her loss.  We will use cognitive behavioral therapy to help identify and change anxiety producing thoughts and behavior patterns so as to improve his ability to better manage anxiety and stress, and manage thoughts and worrisome thinking contributing to anxiety.  We will also refresh and encourage dialectical behavior therapy distress tolerance and mindfulness skills with the intention of reducing anxiety by 50% over the next 6 months. Progress: 30% Interventions: Cognitive Behavioral Therapy  Diagnosis:PTSD, Bi-Polar  D/O  Plan: F/U/ appointments scheduled weekly.  Cecile Coder, [facility]                                                                                                                   Cecile Coder, [facility]               Cecile Coder, [facility]  Cecile Coder, [facility]               Cecile Coder, [facility]               Cecile Coder, [facility]               Cecile Coder, [facility]               Cecile Coder, [facility]               Cecile Coder, [facility]

## 2024-03-04 ENCOUNTER — Encounter: Payer: Self-pay | Admitting: Behavioral Health

## 2024-03-04 ENCOUNTER — Ambulatory Visit (INDEPENDENT_AMBULATORY_CARE_PROVIDER_SITE_OTHER): Payer: Medicare Other | Admitting: Behavioral Health

## 2024-03-04 DIAGNOSIS — F4310 Post-traumatic stress disorder, unspecified: Secondary | ICD-10-CM | POA: Diagnosis not present

## 2024-03-04 DIAGNOSIS — F319 Bipolar disorder, unspecified: Secondary | ICD-10-CM

## 2024-03-04 NOTE — Progress Notes (Signed)
 [facility] Counselor/Therapist Progress Note  Patient ID: Stephen Myers, MRN: [id number],    Date: 03/04/2024 This session was held via video teletherapy. The patient consented to the video teletherapy and was located in his home during this session. He is aware it is the responsibility of the patient to secure confidentiality on his end of the session. The provider was in a private home office for the duration of this session.    The patient arrived on time for her Caregility session  Time Spent: 59 minutes, 2 PM to 2:59 PM  Treatment Type: Individual Therapy Reported Symptoms: anxiety, panic attacks The patient said he jokingly said to his wife that he had not had any pain issues in the cervical area and then a  few days ago he started having pain in his neck and shoulders.  He now sits related to degenerative disk and has some consistent pain somewhere in his spine with that but jokingly says it just depends on Wednesday which part of his body is decides to show up and hurt.  He is doing the best that he can.  He is still walking which he knows is good for him but does not have right now the strength to do as much in the morning as he has done in the past so he is denying that to 3 times a day for consistency.  He is still doing some positive things and that he is staying in touch with his brother and other family members.  He is reconnecting with friends from high school and he is which she is enjoying.  One of them has had a similar experience in that she lost her son in the same way the patient lost his daughter.  He said they did not think and Easter in which they set a place for his daughter at the table and put her picture there.  He initially had reservations about doing that he and his wife had made everyone feel as if she was really there with him.  He still feels that he is grieving in a healthy way.  He and his wife have always taken a trip around both of their birthdays which are 2 days apart.  That is now close to his daughter died so they have started taking some of her rashes with him and sprinkling them where they go as a way of remembrance.  They are deciding what that looks like this year in July.  He does contract for safety having no thoughts of hurting himself or anyone else. We will continue to work on coping skills for helping with his anxiety and the progression of his Lewy body dementia diagnosis as well as processing his grief. Mental Status Exam: Appearance:  Well Groomed     Behavior: Appropriate  Motor: Pt. Reports some instability due to Lewey Body dementia  Speech/Language:  Normal Rate  Affect: Appropriate  Mood: normal  Thought process: normal  Thought content:   WNL   Sensory/Perceptual disturbances:   WNL  Orientation: oriented to person, place, and situation  Attention: Good  Concentration: Good  Memory: Impaired short term  Fund of knowledge:  Good  Insight:   Good  Judgment:  Good  Impulse Control: Good   Risk Assessment: Danger to Self:  No Self-injurious Behavior: No Danger to Others: No Duty to Warn:no Physical Aggression / Violence:No  Access to Firearms a concern: No  Gang Involvement:No  Treatment plan: Will employ cognitive behavioral therapy as well as  grief and loss therapy for relief of anxiety and grief.  Goals of grief therapy or to have a healthier response to the loss of his daughter and less sadness as evidenced by patient report in therapy notes as well as to help him feel less overwhelmed by her loss.  Goals are to see a 50% reduction in grief symptoms over the next 6 months.  I will encourage the use of the patient telling his grief story about his daughter including encouraging him to bring pictures and memorabilia to help process his feelings including any feelings of guilt associated with her loss.  We will use cognitive behavioral therapy to help identify and change anxiety producing thoughts and behavior patterns so as to improve his ability to better manage anxiety and stress, and manage thoughts and worrisome thinking contributing to anxiety.  We will also refresh and encourage dialectical behavior therapy distress tolerance and mindfulness skills with the intention of reducing anxiety by 50% over the next 6 months. Progress: 30% Interventions: Cognitive Behavioral Therapy  Diagnosis:PTSD, Bi-Polar D/O  Plan: F/U/ appointments scheduled weekly.  Cecile Coder, [facility]                                                                                                                   Cecile Coder,  [facility]  [facility] Counselor/Therapist Progress Note  Patient ID: Stephen Myers, MRN: [id number],    Date: 03/04/2024 This session was held via video teletherapy. The patient consented to the video teletherapy and was located in his home during this session. He is aware it is the responsibility of the patient to secure confidentiality on his end of the session. The provider was in a private home office for the duration of this session.    The patient arrived on time for her Caregility session  Time Spent: 58 minutes, 2 PM to 2:58 PM  Treatment Type: Individual Therapy Reported Symptoms: anxiety, panic attacks A fairly difficult week because it was his daughter's birthday.  He said this year was more  difficult than last year but he could not pinpoint why.  They did remember her about going out to a nice restaurant which is something her daughter always wanted to do.  He said they were able to laugh and joke about funny things that his daughter had said or done which is not something they were able to do this time last year so he saw that as healthy grieving.  He knows that the time change threw things off a little bit for everybody and his wife still is not feeling well and his back has been bothering him.  He was not able to go to his first round of physical therapy this week because his back was bothering him more.  He has been thinking a lot more about his daughter which he feels comfortable with.  He is thankful that it is getting warmer and staying light longer so he can get back to some of the coping skills such as fishing that he has done in the past.   We will continue to work on coping skills for helping with his anxiety and the progression of his Lewy body dementia diagnosis as well as processing his grief. Mental Status Exam: Appearance:  Well Groomed     Behavior: Appropriate  Motor: Pt. Reports some instability due to Lewey Body dementia  Speech/Language:  Normal Rate  Affect: Appropriate  Mood: normal  Thought process: normal  Thought content:   WNL  Sensory/Perceptual disturbances:   WNL  Orientation: oriented to person, place, and situation  Attention: Good  Concentration: Good  Memory: Impaired short term  Fund of knowledge:  Good  Insight:   Good  Judgment:  Good  Impulse Control: Good   Risk Assessment: Danger to Self:  No Self-injurious Behavior: No Danger to Others: No Duty to Warn:no Physical Aggression / Violence:No  Access to Firearms a concern: No  Gang Involvement:No  Treatment plan: Will employ cognitive behavioral therapy as well as grief and loss therapy for relief of anxiety and grief.  Goals of grief therapy or to have a healthier response to the loss  of his daughter and less sadness as evidenced by patient report in therapy notes as well as to help him feel less overwhelmed by her loss.  Goals are to see a 50% reduction in grief symptoms over the next 6 months.  I will encourage the use of the patient telling his grief story about his daughter including encouraging him to bring pictures and memorabilia to help process his feelings including any feelings of guilt associated with her loss.  We will use cognitive behavioral therapy to help identify and change anxiety producing thoughts and behavior patterns so as to improve his ability to better manage anxiety and stress, and manage thoughts  and worrisome thinking contributing to anxiety.  We will also refresh and encourage dialectical behavior therapy distress tolerance and mindfulness skills with the intention of reducing anxiety by 50% over the next 6 months. Progress: 30% Interventions: Cognitive Behavioral Therapy  Diagnosis:PTSD, Bi-Polar D/O  Plan: F/U/ appointments scheduled weekly.  Cecile Coder, [facility]                                                                                                                   Cecile Coder, [facility]               Cecile Coder, [facility]               Cecile Coder,  [facility]  [facility] Counselor/Therapist Progress Note  Patient ID: Stephen Myers, MRN: [id number],    Date: 03/04/2024 This session was held via video teletherapy. The patient consented to the video teletherapy and was located in his home during this session. He is aware it is the responsibility of the patient to secure confidentiality on his end of the session. The provider was in a private home office for the duration of this session.    The patient arrived on time for her Caregility session  Time Spent: 1:05 PM until 2 PM, 55 minutes Treatment Type: Individual Therapy Reported Symptoms: anxiety, panic attacks The patient said he came in from his walk this morning and saw what he thought was some sort of tic from his wife.  It  was not the first time it happened and he wonders if it might be tardive dyskinesia because of some new medications that she is on.  She has a call into the doctor right now and hopes to hear something back soon.  They have narrowed down to the fact they think most of her discomfort in breathing is coming from the diaphragm and they are looking for alternatives for treatment.  The patient continues to do those things which help him cope including walking, fishing when he can, spending time with his son and daughter-in-law and grandson.  He also has a way of continued coping with grief he is writing letters to his daughters as a way of expressing his feelings and says that is very beneficial.  He appears to be grieving in a healthy way but he knows that he to year anniversary of her death is coming up and there is some grief there but says it is not overwhelming.  We will continue to work on coping skills for helping with his anxiety and the progression of his Lewy body dementia diagnosis as well as processing his grief. Mental Status Exam: Appearance:  Well Groomed     Behavior: Appropriate  Motor: Pt. Reports some instability due to Lewey Body dementia  Speech/Language:  Normal Rate  Affect: Appropriate  Mood: normal  Thought process: normal  Thought content:   WNL  Sensory/Perceptual disturbances:   WNL  Orientation: oriented to person, place, and situation  Attention: Good  Concentration: Good  Memory: Impaired short term  Fund of knowledge:  Good  Insight:   Good  Judgment:  Good  Impulse Control: Good   Risk Assessment: Danger to Self:  No Self-injurious Behavior: No Danger to Others: No Duty to Warn:no Physical Aggression / Violence:No  Access to Firearms a concern: No  Gang Involvement:No  Treatment plan: Will employ cognitive behavioral therapy as well as grief and loss therapy for relief of anxiety and grief.  Goals of grief therapy or to have a healthier response to the loss  of his daughter and less sadness as evidenced by patient report in therapy notes as well as to help him feel less overwhelmed by her loss.  Goals are to see a 50% reduction in grief symptoms over the next 6 months.  I will encourage the use of the patient telling his grief story about his daughter including encouraging him to bring pictures and memorabilia to help process his feelings including any feelings of guilt associated with her loss.  We will use cognitive behavioral therapy to help identify and change anxiety producing thoughts and behavior patterns so as to improve his ability to better manage anxiety and stress, and manage thoughts and worrisome  thinking contributing to anxiety.  We will also refresh and encourage dialectical behavior therapy distress tolerance and mindfulness skills with the intention of reducing anxiety by 50% over the next 6 months. Progress: 30% Interventions: Cognitive Behavioral Therapy  Diagnosis:PTSD, Bi-Polar D/O  Plan: F/U/ appointments scheduled weekly.  Cecile Coder, [facility]                                                                                                                   Cecile Coder,  [facility]  [facility] Counselor/Therapist Progress Note  Patient ID: Stephen Myers, MRN: [id number],    Date: 03/04/2024 This session was held via video teletherapy. The patient consented to the video teletherapy and was located in his home during this session. He is aware it is the responsibility of the patient to secure confidentiality on his end of the session. The provider was in a private home office for the duration of this session.    The patient arrived on time for her Caregility session  Time Spent: 59 minutes, 2 PM to 2:59 PM  Treatment Type: Individual Therapy Reported Symptoms: anxiety, panic attacks The patient continues to present with physical pain and both his back and his knee.  He started  with a new physical therapist and told him that there was a lot of arthritis in both knees and or certain things he cannot do but they had him do that anyway and now for the past 3 days he has been in significant knee pain.  He has continued to walk because he knows that is good for him in various ways but says it has been painful.  His wife also was not feeling well but she is going back to the doctor next week to have an ablation and hopes that will bring her some relief.  For the most part he has been at the last couple weeks reaching back out and hearing from some old friends as well as time with his son and son's family.  He says that his wife's cousin is coming down for a weekend soon and they enjoy time with her and that he will go to his brother's house sometime in the next few weeks.  He recognizes the need for people that he cares about in helping with his depression. We will continue to work on coping skills for helping with his anxiety and the progression of his Lewy body dementia diagnosis as well as processing his grief. Mental Status Exam: Appearance:  Well Groomed     Behavior: Appropriate  Motor: Pt. Reports some instability due to Lewey Body dementia  Speech/Language:  Normal Rate  Affect: Appropriate  Mood: normal  Thought process: normal  Thought content:   WNL  Sensory/Perceptual disturbances:   WNL  Orientation: oriented to person, place, and situation  Attention: Good  Concentration: Good  Memory: Impaired short term  Fund of knowledge:  Good  Insight:   Good  Judgment:  Good  Impulse Control: Good   Risk Assessment: Danger to Self:  No Self-injurious Behavior: No Danger to Others: No Duty to Warn:no Physical Aggression / Violence:No  Access to Firearms a concern: No  Gang Involvement:No  Treatment plan: Will employ cognitive behavioral therapy as well as grief and loss therapy for relief of anxiety and grief.  Goals of grief therapy or to have a healthier response  to the loss of his daughter and less sadness as evidenced by patient report in therapy notes as well as to help him feel less overwhelmed by her loss.  Goals are to see a 50% reduction in grief symptoms over the next 6 months.  I will encourage the use of the patient telling his grief story about his daughter including encouraging him to bring pictures and memorabilia to help process his feelings including any feelings of guilt associated with her loss.  We will use cognitive behavioral therapy to help identify and change anxiety producing thoughts and behavior patterns so as to improve his ability to better  manage anxiety and stress, and manage thoughts and worrisome thinking contributing to anxiety.  We will also refresh and encourage dialectical behavior therapy distress tolerance and mindfulness skills with the intention of reducing anxiety by 50% over the next 6 months. Progress: 30% Interventions: Cognitive Behavioral Therapy  Diagnosis:PTSD, Bi-Polar D/O  Plan: F/U/ appointments scheduled weekly.  Cecile Coder, [facility]                                                                                                                   Cecile Coder, [facility]               Cecile Coder, [facility]               Cecile Coder, [facility]               Cecile Coder,  [facility]  [facility] [facility] Counselor/Therapist Progress Note  Patient ID: Stephen Myers, MRN: [id number],    Date: 03/04/2024 This session was held via video teletherapy. The patient consented to the video teletherapy and was located in his home during this session. He is aware it is the responsibility of the patient to secure confidentiality on his end of the session. The provider was in a private home office for the duration of this session.    The patient arrived on time for her Caregility session  Time Spent: 57 minutes, 2 PM to 2:57 PM  Treatment Type: Individual Therapy Reported Symptoms: anxiety, panic attacks The patient is still having significant back pain.  He cannot sit anywhere for very long and has to be  careful where he sits.  There is still some unsteadiness although he did not report any falls over the past 2 weeks.  His biggest concern now is for his wife.  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  She is meeting with her cardiologist again next week to see what possible treatment is for her.  He still thinks there may be some issue with her diaphragm but she is meeting with the pulmonologist also.  He is doing what he can to help her.  His daughter's birthday is March 11 so he knows that is a difficult time for both he and his wife next week.  Typically they do something to remember her but because of the wife's current health issues they cannot go to [place] as they had planned so they are talking about what to do to remember and celebrate her birthday.  We will continue to work on coping skills for helping with his anxiety and the progression of his Lewy body dementia diagnosis as well as processing his grief. Mental Status Exam: Appearance:  Well Groomed     Behavior: Appropriate  Motor: Pt. Reports some instability due to Lewey Body dementia  Speech/Language:  Normal Rate  Affect: Appropriate  Mood: normal  Thought process: normal  Thought content:   WNL  Sensory/Perceptual disturbances:   WNL  Orientation: oriented to person, place, and situation  Attention: Good  Concentration: Good  Memory: Impaired short term  Fund of knowledge:  Good  Insight:   Good  Judgment:  Good  Impulse Control: Good   Risk Assessment: Danger to Self:  No Self-injurious Behavior: No Danger to Others: No Duty to Warn:no Physical Aggression / Violence:No  Access to Firearms a concern: No  Gang Involvement:No  Treatment plan: Will employ cognitive behavioral therapy as well as grief and loss therapy for relief of anxiety and grief.  Goals of grief therapy or to have a healthier response to the loss of his daughter  and less sadness as evidenced by patient report in therapy notes as well as to help him feel less overwhelmed by her loss.  Goals are to see a 50% reduction in grief symptoms over the next 6 months.  I will encourage the use of the patient telling his grief story about his daughter including encouraging him to bring pictures and memorabilia to help process his feelings including any feelings of guilt associated with her loss.  We will use cognitive behavioral therapy to help identify and change anxiety producing thoughts and behavior patterns so as to improve his ability to better manage anxiety and stress, and manage thoughts and worrisome thinking contributing  to anxiety.  We will also refresh and encourage dialectical behavior therapy distress tolerance and mindfulness skills with the intention of reducing anxiety by 50% over the next 6 months. Progress: 30% Interventions: Cognitive Behavioral Therapy  Diagnosis:PTSD, Bi-Polar D/O  Plan: F/U/ appointments scheduled weekly.  Cecile Coder, [facility]                                                                                                                   Cecile Coder,  [facility]  [place] [facility] Counselor/Therapist Progress Note  Patient ID: Stephen Myers, MRN: [id number],    Date: 03/04/2024 This session was held via video teletherapy. The patient consented to the video teletherapy and was located in his home during this session. He is aware it is the responsibility of the patient to secure confidentiality on his end of the session. The provider was in a private home office for the duration of this session.    The patient arrived on time for her Caregility session  Time Spent: 58 minutes, 2 PM to 2:58 PM  Treatment Type: Individual Therapy Reported Symptoms: anxiety, panic attacks A fairly difficult week because it was his daughter's birthday.  He said this year was more  difficult than last year but he could not pinpoint why.  They did remember her about going out to a nice restaurant which is something her daughter always wanted to do.  He said they were able to laugh and joke about funny things that his daughter had said or done which is not something they were able to do this time last year so he saw that as healthy grieving.  He knows that the time change threw things off a little bit for everybody and his wife still is not feeling well and his back has been bothering him.  He was not able to go to his first round of physical therapy this week because his back was bothering him more.  He has been thinking a lot more about his daughter which he feels comfortable with.  He is thankful that it is getting warmer and staying light longer so he can get back to some of the coping skills such as fishing that he has done in the past.   We will continue to work on coping skills for helping with his anxiety and the progression of his Lewy body dementia diagnosis as well as processing his grief. Mental Status Exam: Appearance:  Well Groomed     Behavior: Appropriate  Motor: Pt. Reports some instability due to Lewey Body dementia  Speech/Language:  Normal Rate  Affect: Appropriate  Mood: normal  Thought process: normal  Thought content:   WNL  Sensory/Perceptual disturbances:   WNL  Orientation: oriented to person, place, and situation  Attention: Good  Concentration: Good  Memory: Impaired short term  Fund of knowledge:  Good  Insight:   Good  Judgment:  Good  Impulse Control: Good   Risk Assessment: Danger to Self:  No Self-injurious Behavior: No Danger to Others: No Duty to Warn:no Physical Aggression / Violence:No  Access to Firearms a concern: No  Gang Involvement:No  Treatment plan: Will employ cognitive behavioral therapy as well as grief and loss therapy for relief of anxiety and grief.  Goals of grief therapy or to have a healthier response to the loss  of his daughter and less sadness as evidenced by patient report in therapy notes as well as to help him feel less overwhelmed by her loss.  Goals are to see a 50% reduction in grief symptoms over the next 6 months.  I will encourage the use of the patient telling his grief story about his daughter including encouraging him to bring pictures and memorabilia to help process his feelings including any feelings of guilt associated with her loss.  We will use cognitive behavioral therapy to help identify and change anxiety producing thoughts and behavior patterns so as to improve his ability to better manage anxiety and stress, and manage thoughts  and worrisome thinking contributing to anxiety.  We will also refresh and encourage dialectical behavior therapy distress tolerance and mindfulness skills with the intention of reducing anxiety by 50% over the next 6 months. Progress: 30% Interventions: Cognitive Behavioral Therapy  Diagnosis:PTSD, Bi-Polar D/O  Plan: F/U/ appointments scheduled weekly.  Cecile Coder, [facility] [facility]                                                                                                                   Cecile Coder, [facility]               Cecile Coder, [facility]               Cecile Coder,  [facility]  [place] [facility] Counselor/Therapist Progress Note  Patient ID: Stephen Myers, MRN: [id number],    Date: 03/04/2024 This session was held via video teletherapy. The patient consented to the video teletherapy and was located in his home during this session. He is aware it is the responsibility of the patient to secure confidentiality on his end of the session. The provider was in a private home office for the duration of this session.    The patient arrived on time for her Caregility session  Time Spent: 57 minutes, 2 PM to 2:57 PM  Treatment Type: Individual Therapy Reported Symptoms: anxiety, panic attacks The patient is still having significant back pain.  He cannot sit anywhere for very long and has to be  careful where he sits.  There is still some unsteadiness although he did not report any falls over the past 2 weeks.  His biggest concern now is for his wife.  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  She is meeting with her cardiologist again next week to see what possible treatment is for her.  He still thinks there may be some issue with her diaphragm but she is meeting with the pulmonologist also.  He is doing what he can to help her.  His daughter's birthday is March 11 so he knows that is a difficult time for both he and his wife next week.  Typically they do something to remember her but because of the wife's current health issues they cannot go to [place] as they had planned so they are talking about what to do to remember and celebrate her birthday.  We will continue to work on coping skills for helping with his anxiety and the progression of his Lewy body dementia diagnosis as well as processing his grief. Mental Status Exam: Appearance:  Well Groomed     Behavior: Appropriate  Motor: Pt. Reports some instability due to Lewey Body dementia  Speech/Language:  Normal Rate  Affect: Appropriate  Mood: normal  Thought process: normal  Thought content:   WNL  Sensory/Perceptual disturbances:   WNL  Orientation: oriented to person, place, and situation  Attention: Good  Concentration: Good  Memory: Impaired short term  Fund of knowledge:  Good  Insight:   Good  Judgment:  Good  Impulse Control: Good   Risk Assessment: Danger to Self:  No Self-injurious Behavior: No Danger to Others: No Duty to Warn:no Physical Aggression / Violence:No  Access to Firearms a concern: No  Gang Involvement:No  Treatment plan: Will employ cognitive behavioral therapy as well as grief and loss therapy for relief of anxiety and grief.  Goals of grief therapy or to have a healthier response to the loss of his daughter  and less sadness as evidenced by patient report in therapy notes as well as to help him feel less overwhelmed by her loss.  Goals are to see a 50% reduction in grief symptoms over the next 6 months.  I will encourage the use of the patient telling his grief story about his daughter including encouraging him to bring pictures and memorabilia to help process his feelings including any feelings of guilt associated with her loss.  We will use cognitive behavioral therapy to help identify and change anxiety producing thoughts and behavior patterns so as to improve his ability to better manage anxiety and stress, and manage thoughts and worrisome thinking contributing  to anxiety.  We will also refresh and encourage dialectical behavior therapy distress tolerance and mindfulness skills with the intention of reducing anxiety by 50% over the next 6 months. Progress: 30% Interventions: Cognitive Behavioral Therapy  Diagnosis:PTSD, Bi-Polar D/O  Plan: F/U/ appointments scheduled weekly.  Cecile Coder, [facility]                                                                                                                   Cecile Coder,  [facility]  [place] [facility] Counselor/Therapist Progress Note  Patient ID: Stephen Myers, MRN: [id number],    Date: 03/04/2024 This session was held via video teletherapy. The patient consented to the video teletherapy and was located in his home during this session. He is aware it is the responsibility of the patient to secure confidentiality on his end of the session. The provider was in a private home office for the duration of this session.    The patient arrived on time for her Caregility session  Time Spent: 57 minutes, 2 PM to 2:57 PM  Treatment Type: Individual Therapy Reported Symptoms: anxiety, panic attacks  We will continue to work on coping skills for helping with his anxiety and the progression of his  Lewy body dementia diagnosis as well as processing his grief. Mental Status Exam: Appearance:  Well Groomed     Behavior: Appropriate  Motor: Pt. 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  Speech/Language:  Normal Rate  Affect: Appropriate  Mood: normal  Thought process: normal  Thought content:   WNL  Sensory/Perceptual disturbances:   WNL  Orientation: oriented to person, place, and situation  Attention: Good  Concentration: Good  Memory: Impaired short term  Fund of knowledge:  Good  Insight:   Good  Judgment:  Good  Impulse Control: Good   Risk Assessment: Danger to Self:  No Self-injurious Behavior: No Danger to Others: No Duty to Warn:no Physical Aggression / Violence:No  Access to Firearms a concern: No  Gang Involvement:No  Treatment plan: Will employ cognitive behavioral therapy as well as grief and loss therapy for relief of anxiety and grief.  Goals of grief therapy or to have a healthier response to the loss of his daughter and less sadness as evidenced by patient report in therapy notes as well as to help him feel less overwhelmed by her loss.  Goals are to see a 50% reduction in grief symptoms over the next 6 months.  I will encourage the use of the patient telling his grief story about his daughter including encouraging him to bring pictures and memorabilia to help process his feelings including any feelings of guilt associated with her loss.  We will use cognitive behavioral therapy to help identify and change anxiety producing thoughts and behavior patterns so as to improve his ability to better manage anxiety and stress, and manage thoughts and worrisome thinking contributing to anxiety.  We will also refresh and encourage dialectical behavior therapy distress tolerance and mindfulness skills with the intention of reducing anxiety by 50% over the next 6 months. Progress: 30% Interventions: Cognitive Behavioral Therapy  Diagnosis:PTSD, Bi-Polar D/O  Plan:  F/U/ appointments scheduled weekly.  Cecile Coder, [facility]                                                                                                                   Cecile Coder, [facility]               Cecile Coder, [facility]  Cecile Coder, [facility]               Cecile Coder, [facility]               Cecile Coder, [facility]               Cecile Coder, [facility]               Cecile Coder, [facility]  [place] [facility] Counselor/Therapist Progress Note  Patient ID: Stephen Myers, MRN: [id number],    Date:  03/04/2024 This session was held via video teletherapy. The patient consented to the video teletherapy and was located in his home during this session. He is aware it is the responsibility of the patient to secure confidentiality on his end of the session. The provider was in a private home office for the duration of this session.    The patient arrived on time for her Caregility session  Time Spent: 39 minutes, 2 PM to 2:39 PM  Treatment Type: Individual Therapy Reported Symptoms: anxiety, panic attacks The patient was not feeling well today.  He started feeling really earlier in the week having a fever bad headaches body aches.  He felt a little better yesterday but feels a little bit of a resurgence in headaches and body aches and is running a low-grade fever.  His wife is feeling some of the same symptoms.  For the most part he says he is doing well.  They did increase his memory medication because they were starting to see some increasing decline in short-term memory and he said until recently his long-term memory had been good but he was starting to see a difference in how he used to markers to remember what happened at certain times historically.  He feels the medication has helped and has brightened his mood a little also.  He is still trying to walk every day and fish when he can and has enjoyed watching the [employer] as a good distraction.  He reports that he feels he is managing mood fairly well.  He has an appointment in 2 weeks.  He does contract for safety having no thoughts of hurting himself or anyone else. We will continue to work on coping skills for helping with his anxiety and the progression of his Lewy body dementia diagnosis as well as processing his grief. Mental Status Exam: Appearance:  Well Groomed     Behavior: Appropriate  Motor: Pt. 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  Speech/Language:  Normal Rate  Affect: Appropriate  Mood: normal   Thought process: normal  Thought content:   WNL  Sensory/Perceptual disturbances:   WNL  Orientation: oriented to person, place, and situation  Attention: Good  Concentration: Good  Memory: Impaired short term  Fund of knowledge:  Good  Insight:   Good  Judgment:  Good  Impulse Control: Good   Risk Assessment: Danger to Self:  No Self-injurious Behavior: No Danger to Others: No Duty to Warn:no Physical Aggression / Violence:No  Access to Firearms a concern: No  Gang Involvement:No  Treatment plan: Will employ cognitive behavioral therapy as well as grief and loss therapy for relief of anxiety and grief.  Goals of grief therapy or to have a healthier response to the loss of his daughter and less sadness as evidenced by patient report in therapy notes as well as to help him feel less overwhelmed by her loss.  Goals are to see a 50% reduction in grief symptoms over the next 6 months.  I will encourage the use of the patient telling his grief story about his daughter including encouraging him to bring pictures and memorabilia to help process his feelings including any feelings of guilt associated with her loss.  We will use cognitive behavioral therapy to help identify and change anxiety producing thoughts and behavior patterns so as to improve his ability to better manage anxiety  and stress, and manage thoughts and worrisome thinking contributing to anxiety.  We will also refresh and encourage dialectical behavior therapy distress tolerance and mindfulness skills with the intention of reducing anxiety by 50% over the next 6 months. Progress: 30% Interventions: Cognitive Behavioral Therapy  Diagnosis:PTSD, Bi-Polar D/O  Plan: F/U/ appointments scheduled weekly.  Cecile Coder,  [facility]                                                                                                                   Cecile Coder, [facility]  [place] [facility] Counselor/Therapist Progress Note  Patient ID: Stephen Myers, MRN: [id number],    Date: 03/04/2024 This session was held via video teletherapy. The patient consented to the video teletherapy and was located in his home during this session. He is aware it is the responsibility of the patient to secure confidentiality on his end of the session. The provider was in a private home office for the duration of this session.    The patient arrived on time  for her Caregility session  Time Spent: 58 minutes, 2 PM to 2:58 PM  Treatment Type: Individual Therapy Reported Symptoms: anxiety, panic attacks A fairly difficult week because it was his daughter's birthday.  He said this year was more difficult than last year but he could not pinpoint why.  They did remember her about going out to a nice restaurant which is something her daughter always wanted to do.  He said they were able to laugh and joke about funny things that his daughter had said or done which is not something they were able to do this time last year so he saw that as healthy grieving.  He knows that the time change threw things off a little bit for everybody and his wife still is not feeling well and his back has been bothering him.  He was not able to go to his first round of physical therapy this week because his back was bothering him more.  He has been thinking a lot more about his daughter which he feels comfortable with.  He is thankful that it is getting warmer and staying light longer so he can get back to some of the coping skills such as fishing that he has done in the past.   We will continue to work on coping skills for helping with his anxiety and the progression of his Lewy body dementia diagnosis as well as processing his grief. Mental Status Exam: Appearance:  Well Groomed     Behavior: Appropriate  Motor: Pt. 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  Speech/Language:  Normal Rate  Affect: Appropriate  Mood: normal  Thought process: normal  Thought content:   WNL  Sensory/Perceptual disturbances:   WNL  Orientation: oriented to person, place, and situation  Attention: Good  Concentration: Good  Memory: Impaired short term  Fund of knowledge:  Good  Insight:   Good  Judgment:  Good  Impulse Control: Good   Risk Assessment: Danger to Self:  No Self-injurious Behavior: No Danger to Others: No Duty to Warn:no Physical Aggression / Violence:No   Access to Firearms a concern: No  Gang Involvement:No  Treatment plan: Will employ cognitive behavioral therapy as well as grief and loss therapy for relief of anxiety and grief.  Goals of grief therapy or to have a healthier response to the loss of his daughter and less sadness as evidenced by patient report in therapy notes as well as to help him feel less overwhelmed by her loss.  Goals are to see a 50% reduction in grief symptoms over the next 6 months.  I will encourage the use of the patient telling his grief story about his daughter including encouraging him to bring pictures and memorabilia to help process his feelings including any feelings of guilt associated with her loss.  We will use cognitive behavioral therapy to help identify and change anxiety producing thoughts and behavior patterns so as to improve his ability to better manage anxiety and stress, and manage thoughts  and worrisome thinking contributing to anxiety.  We will also refresh and encourage dialectical behavior therapy distress tolerance and mindfulness skills with the intention of reducing anxiety by 50% over the next 6 months. Progress: 30% Interventions: Cognitive Behavioral Therapy  Diagnosis:PTSD, Bi-Polar D/O  Plan: F/U/ appointments scheduled weekly.  Cecile Coder, [facility]                                                                                                                   Cecile Coder, [facility]               Cecile Coder, [facility]               Cecile Coder,  [facility]  [place] [facility] Counselor/Therapist Progress Note  Patient ID: Stephen Myers, MRN: [id number],    Date: 03/04/2024 This session was held via video teletherapy. The patient consented to the video teletherapy and was located in his home during this session. He is aware it is the responsibility of the patient to secure confidentiality on his end of the session. The provider was in a private home office for the duration of this session.    The patient arrived on time for her Caregility session  Time Spent: 1:05 PM until 2 PM, 55 minutes Treatment Type: Individual Therapy Reported Symptoms: anxiety, panic attacks The patient said he came in from his walk this morning and saw what he thought was some sort of tic from his wife.  It  was not the first time it happened and he wonders if it might be tardive dyskinesia because of some new medications that she is on.  She has a call into the doctor right now and hopes to hear something back soon.  They have narrowed down to the fact they think most of her discomfort in breathing is coming from the diaphragm and they are looking for alternatives for treatment.  The patient continues to do those things which help him cope including walking, fishing when he can, spending time with his son and daughter-in-law and grandson.  He also has a way of continued coping with grief he is writing letters to his daughters as a way of expressing his feelings and says that is very beneficial.  He appears to be grieving in a healthy way but he knows that he to year anniversary of her death is coming up and there is some grief there but says it is not overwhelming.  We will continue to work on coping skills for helping with his anxiety and the progression of his Lewy body dementia diagnosis as well as processing his grief. Mental Status Exam: Appearance:  Well Groomed     Behavior: Appropriate  Motor: Pt. 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  Speech/Language:  Normal Rate  Affect: Appropriate  Mood: normal  Thought process: normal  Thought content:   WNL  Sensory/Perceptual disturbances:   WNL  Orientation: oriented to person, place, and situation  Attention: Good  Concentration: Good  Memory: Impaired short term  Fund of knowledge:  Good  Insight:   Good  Judgment:  Good  Impulse Control: Good   Risk Assessment: Danger to Self:  No Self-injurious Behavior: No Danger to Others: No Duty to Warn:no Physical Aggression / Violence:No  Access to Firearms a concern: No  Gang Involvement:No  Treatment plan: Will employ cognitive behavioral therapy as well as grief and loss therapy for relief of anxiety and grief.  Goals of grief therapy or to have a healthier response to the loss  of his daughter and less sadness as evidenced by patient report in therapy notes as well as to help him feel less overwhelmed by her loss.  Goals are to see a 50% reduction in grief symptoms over the next 6 months.  I will encourage the use of the patient telling his grief story about his daughter including encouraging him to bring pictures and memorabilia to help process his feelings including any feelings of guilt associated with her loss.  We will use cognitive behavioral therapy to help identify and change anxiety producing thoughts and behavior patterns so as to improve his ability to better manage anxiety and stress, and manage thoughts and worrisome  thinking contributing to anxiety.  We will also refresh and encourage dialectical behavior therapy distress tolerance and mindfulness skills with the intention of reducing anxiety by 50% over the next 6 months. Progress: 30% Interventions: Cognitive Behavioral Therapy  Diagnosis:PTSD, Bi-Polar D/O  Plan: F/U/ appointments scheduled weekly.  Cecile Coder, [facility]                                                                                                                   Cecile Coder,  [facility]  [place] [facility] Counselor/Therapist Progress Note  Patient ID: Stephen Myers, MRN: [id number],    Date: 03/04/2024 This session was held via video teletherapy. The patient consented to the video teletherapy and was located in his home during this session. He is aware it is the responsibility of the patient to secure confidentiality on his end of the session. The provider was in a private home office for the duration of this session.    The patient arrived on time for her Caregility session  Time Spent: 59 minutes, 2 PM to 2:59 PM  Treatment Type: Individual Therapy Reported Symptoms: anxiety, panic attacks The patient continues to present with physical pain and both his back and his knee.  He started  with a new physical therapist and told him that there was a lot of arthritis in both knees and or certain things he cannot do but they had him do that anyway and now for the past 3 days he has been in significant knee pain.  He has continued to walk because he knows that is good for him in various ways but says it has been painful.  His wife also was not feeling well but she is going back to the doctor next week to have an ablation and hopes that will bring her some relief.  For the most part he has been at the last couple weeks reaching back out and hearing from some old friends as well as time with his son and son's family.  He says that his wife's cousin is coming down for a weekend soon and they enjoy time with her and that he will go to his brother's house sometime in the next few weeks.  He recognizes the need for people that he cares about in helping with his depression. We will continue to work on coping skills for helping with his anxiety and the progression of his Lewy body dementia diagnosis as well as processing his grief. Mental Status Exam: Appearance:  Well Groomed     Behavior: Appropriate  Motor: Pt. 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  Speech/Language:  Normal Rate  Affect: Appropriate  Mood: normal  Thought process: normal  Thought content:   WNL  Sensory/Perceptual disturbances:   WNL  Orientation: oriented to person, place, and situation  Attention: Good  Concentration: Good  Memory: Impaired short term  Fund of knowledge:  Good  Insight:   Good  Judgment:  Good  Impulse Control: Good   Risk Assessment: Danger to Self:  No Self-injurious Behavior: No Danger to Others: No Duty to Warn:no Physical Aggression / Violence:No  Access to Firearms a concern: No  Gang Involvement:No  Treatment plan: Will employ cognitive behavioral therapy as well as grief and loss therapy for relief of anxiety and grief.  Goals of grief therapy or to have a healthier response  to the loss of his daughter and less sadness as evidenced by patient report in therapy notes as well as to help him feel less overwhelmed by her loss.  Goals are to see a 50% reduction in grief symptoms over the next 6 months.  I will encourage the use of the patient telling his grief story about his daughter including encouraging him to bring pictures and memorabilia to help process his feelings including any feelings of guilt associated with her loss.  We will use cognitive behavioral therapy to help identify and change anxiety producing thoughts and behavior patterns so as to improve his ability to better  manage anxiety and stress, and manage thoughts and worrisome thinking contributing to anxiety.  We will also refresh and encourage dialectical behavior therapy distress tolerance and mindfulness skills with the intention of reducing anxiety by 50% over the next 6 months. Progress: 30% Interventions: Cognitive Behavioral Therapy  Diagnosis:PTSD, Bi-Polar D/O  Plan: F/U/ appointments scheduled weekly.  Cecile Coder, [facility]                                                                                                                   Cecile Coder, [facility]               Cecile Coder, [facility]               Cecile Coder, [facility]               Cecile Coder,  [facility]  [place] [facility] Counselor/Therapist Progress Note  Patient ID: Stephen Myers, MRN: [id number],    Date: 03/04/2024 This session was held via video teletherapy. The patient consented to the video teletherapy and was located in his home during this session. He is aware it is the responsibility of the patient to secure confidentiality on his end of the session. The provider was in a private home office for the duration of this session.    The patient arrived on time for her Caregility session  Time Spent: 57 minutes, 2 PM to 2:57 PM  Treatment Type: Individual Therapy Reported Symptoms: anxiety, panic attacks The patient is still having significant back pain.  He cannot sit anywhere for very long and has to be  careful where he sits.  There is still some unsteadiness although he did not report any falls over the past 2 weeks.  His biggest concern now is for his wife.  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  She is meeting with her cardiologist again next week to see what possible treatment is for her.  He still thinks there may be some issue with her diaphragm but she is meeting with the pulmonologist also.  He is doing what he can to help her.  His daughter's birthday is March 11 so he knows that is a difficult time for both he and his wife next week.  Typically they do something to remember her but because of the wife's current health issues they cannot go to [place] as they had planned so they are talking about what to do to remember and celebrate her birthday.  We will continue to work on coping skills for helping with his anxiety and the progression of his Lewy body dementia diagnosis as well as processing his grief. 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  Speech/Language:  Normal Rate  Affect: Appropriate  Mood: normal  Thought process: normal  Thought content:   WNL  Sensory/Perceptual disturbances:   WNL  Orientation: oriented to person, place, and situation  Attention: Good  Concentration: Good  Memory: Impaired short term  Fund of knowledge:  Good  Insight:   Good  Judgment:  Good  Impulse Control: Good   Risk Assessment: Danger to Self:  No Self-injurious Behavior: No Danger to Others: No Duty to Warn:no Physical Aggression / Violence:No  Access to Firearms a concern: No  Gang Involvement:No  Treatment plan: Will employ cognitive behavioral therapy as well as grief and loss therapy for relief of anxiety and grief.  Goals of grief therapy or to have a healthier response to the loss of his daughter  and less sadness as evidenced by patient report in therapy notes as well as to help him feel less overwhelmed by her loss.  Goals are to see a 50% reduction in grief symptoms over the next 6 months.  I will encourage the use of the patient telling his grief story about his daughter including encouraging him to bring pictures and memorabilia to help process his feelings including any feelings of guilt associated with her loss.  We will use cognitive behavioral therapy to help identify and change anxiety producing thoughts and behavior patterns so as to improve his ability to better manage anxiety and stress, and manage thoughts and worrisome thinking contributing  to anxiety.  We will also refresh and encourage dialectical behavior therapy distress tolerance and mindfulness skills with the intention of reducing anxiety by 50% over the next 6 months. Progress: 30% Interventions: Cognitive Behavioral Therapy  Diagnosis:PTSD, Bi-Polar D/O  Plan: F/U/ appointments scheduled weekly.  Cecile Coder, [facility]                                                                                                                   Cecile Coder,  [facility]  [facility] Counselor/Therapist Progress Note  Patient ID: Guss Villaruz, MRN: [id number],    Date: 03/04/2024 This session was held via video teletherapy. The patient consented to the video teletherapy and was located in his home during this session. He is aware it is the responsibility of the patient to secure confidentiality on his end of the session. The provider was in a private home office for the duration of this session.    The patient arrived on time for her Caregility session  Time Spent: 58 minutes, 2 PM to 2:58 PM  Treatment Type: Individual Therapy Reported Symptoms: anxiety, panic attacks A fairly difficult week because it was his daughter's birthday.  He said this year was more  difficult than last year but he could not pinpoint why.  They did remember her about going out to a nice restaurant which is something her daughter always wanted to do.  He said they were able to laugh and joke about funny things that his daughter had said or done which is not something they were able to do this time last year so he saw that as healthy grieving.  He knows that the time change threw things off a little bit for everybody and his wife still is not feeling well and his back has been bothering him.  He was not able to go to his first round of physical therapy this week because his back was bothering him more.  He has been thinking a lot more about his daughter which he feels comfortable with.  He is thankful that it is getting warmer and staying light longer so he can get back to some of the coping skills such as fishing that he has done in the past.   We will continue to work on coping skills for helping with his anxiety and the progression of his Lewy body dementia diagnosis as well as processing his grief. 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  Speech/Language:  Normal Rate  Affect: Appropriate  Mood: normal  Thought process: normal  Thought content:   WNL  Sensory/Perceptual disturbances:   WNL  Orientation: oriented to person, place, and situation  Attention: Good  Concentration: Good  Memory: Impaired short term  Fund of knowledge:  Good  Insight:   Good  Judgment:  Good  Impulse Control: Good   Risk Assessment: Danger to Self:  No Self-injurious Behavior: No Danger to Others: No Duty to Warn:no Physical Aggression / Violence:No  Access to Firearms a concern: No  Gang Involvement:No  Treatment plan: Will employ cognitive behavioral therapy as well as grief and loss therapy for relief of anxiety and grief.  Goals of grief therapy or to have a healthier response to the loss  of his daughter and less sadness as evidenced by patient report in therapy notes as well as to help him feel less overwhelmed by her loss.  Goals are to see a 50% reduction in grief symptoms over the next 6 months.  I will encourage the use of the patient telling his grief story about his daughter including encouraging him to bring pictures and memorabilia to help process his feelings including any feelings of guilt associated with her loss.  We will use cognitive behavioral therapy to help identify and change anxiety producing thoughts and behavior patterns so as to improve his ability to better manage anxiety and stress, and manage thoughts  and worrisome thinking contributing to anxiety.  We will also refresh and encourage dialectical behavior therapy distress tolerance and mindfulness skills with the intention of reducing anxiety by 50% over the next 6 months. Progress: 30% Interventions: Cognitive Behavioral Therapy  Diagnosis:PTSD, Bi-Polar D/O  Plan: F/U/ appointments scheduled weekly.  Cecile Coder, [facility]                                                                                                                   Cecile Coder, [facility]               Cecile Coder, [facility]               Cecile Coder,  [facility]  [facility] Counselor/Therapist Progress Note  Patient ID: Tarelle Yanes, MRN: [id number],    Date: 03/04/2024 This session was held via video teletherapy. The patient consented to the video teletherapy and was located in his home during this session. He is aware it is the responsibility of the patient to secure confidentiality on his end of the session. The provider was in a private home office for the duration of this session.    The patient arrived on time for her Caregility session  Time Spent: 57 minutes, 2 PM to 2:57 PM  Treatment Type: Individual Therapy Reported Symptoms: anxiety, panic attacks The patient is still having significant back pain.  He cannot sit anywhere for very long and has to be  careful where he sits.  There is still some unsteadiness although he did not report any falls over the past 2 weeks.  His biggest concern now is for his wife.  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  She is meeting with her cardiologist again next week to see what possible treatment is for her.  He still thinks there may be some issue with her diaphragm but she is meeting with the pulmonologist also.  He is doing what he can to help her.  His daughter's birthday is March 11 so he knows that is a difficult time for both he and his wife next week.  Typically they do something to remember her but because of the wife's current health issues they cannot go to [place] as they had planned so they are talking about what to do to remember and celebrate her birthday.  We will continue to work on coping skills for helping with his anxiety and the progression of his Lewy body dementia diagnosis as well as processing his grief. 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  Speech/Language:  Normal Rate  Affect: Appropriate  Mood: normal  Thought process: normal  Thought content:   WNL  Sensory/Perceptual disturbances:   WNL  Orientation: oriented to person, place, and situation  Attention: Good  Concentration: Good  Memory: Impaired short term  Fund of knowledge:  Good  Insight:   Good  Judgment:  Good  Impulse Control: Good   Risk Assessment: Danger to Self:  No Self-injurious Behavior: No Danger to Others: No Duty to Warn:no Physical Aggression / Violence:No  Access to Firearms a concern: No  Gang Involvement:No  Treatment plan: Will employ cognitive behavioral therapy as well as grief and loss therapy for relief of anxiety and grief.  Goals of grief therapy or to have a healthier response to the loss of his daughter  and less sadness as evidenced by patient report in therapy notes as well as to help him feel less overwhelmed by her loss.  Goals are to see a 50% reduction in grief symptoms over the next 6 months.  I will encourage the use of the patient telling his grief story about his daughter including encouraging him to bring pictures and memorabilia to help process his feelings including any feelings of guilt associated with her loss.  We will use cognitive behavioral therapy to help identify and change anxiety producing thoughts and behavior patterns so as to improve his ability to better manage anxiety and stress, and manage thoughts and worrisome thinking contributing  to anxiety.  We will also refresh and encourage dialectical behavior therapy distress tolerance and mindfulness skills with the intention of reducing anxiety by 50% over the next 6 months. Progress: 30% Interventions: Cognitive Behavioral Therapy  Diagnosis:PTSD, Bi-Polar D/O  Plan: F/U/ appointments scheduled weekly.  Cecile Coder, [facility]                                                                                                                   Cecile Coder,  [facility]  [facility] Counselor/Therapist Progress Note  Patient ID: Ludy Litherland, MRN: [id number],    Date: 03/04/2024 This session was held via video teletherapy. The patient consented to the video teletherapy and was located in his home during this session. He is aware it is the responsibility of the patient to secure confidentiality on his end of the session. The provider was in a private home office for the duration of this session.    The patient arrived on time for her Caregility session  Time Spent: 57 minutes, 2 PM to 2:57 PM  Treatment Type: Individual Therapy Reported Symptoms: anxiety, panic attacks  The patient reported that he had not been sleeping well so he did speak with his psychiatrist  about adjusting some meds.  Initially they adjusted too much up and he said he felt groggy for the entire next day but they have found a combination now where he feels like he is getting more rested and only feels a little drowsy shortly after waking up..  There have been a couple times where he stumbled and one of the times he felt down but did not get hurt the other times he was able to catch himself.  He is trying to stay as active as he can walking daily and fishing with his wife when he can.  His biggest anxiety now is his wife has an upcoming surgery on her diaphragm in 2 weeks so he knows that will be a fairly significant recovery time but also sees the difficulties she is having now and wants her to get some relief..  His son and daughter-in-law will be supports for them also.  He continues to speak with friends and family members as encouragement.  He also feels that he is grieving appropriately writing to his daughter on a regular basis about the things that are going on in his and his wife's lives.  We will continue to work on coping skills for helping with his anxiety and the progression of his Lewy body dementia diagnosis as well as processing his grief. 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  Speech/Language:  Normal Rate  Affect: Appropriate  Mood: normal  Thought process: normal  Thought content:   WNL  Sensory/Perceptual disturbances:   WNL  Orientation: oriented to person, place, and situation  Attention: Good  Concentration: Good  Memory: Impaired short term  Fund of knowledge:  Good  Insight:   Good  Judgment:  Good  Impulse Control: Good   Risk Assessment: Danger to Self:  No Self-injurious Behavior: No Danger to Others: No Duty to Warn:no Physical Aggression / Violence:No  Access to Firearms a concern: No  Gang Involvement:No  Treatment plan: Will employ cognitive  behavioral therapy as well as grief and loss therapy for relief of anxiety and grief.  Goals of grief therapy or to have a healthier response to the loss of his daughter and less sadness as evidenced by patient report in therapy notes as well as to help him feel less overwhelmed by her loss.  Goals are to see a 50% reduction in grief symptoms over the next 6 months.  I will encourage the use of the patient telling his grief story about his daughter including encouraging him to bring pictures and memorabilia to help process his feelings including any feelings of guilt associated with her loss.  We will use cognitive behavioral therapy to  help identify and change anxiety producing thoughts and behavior patterns so as to improve his ability to better manage anxiety and stress, and manage thoughts and worrisome thinking contributing to anxiety.  We will also refresh and encourage dialectical behavior therapy distress tolerance and mindfulness skills with the intention of reducing anxiety by 50% over the next 6 months. Progress: 30% Interventions: Cognitive Behavioral Therapy  Diagnosis:PTSD, Bi-Polar D/O  Plan: F/U/ appointments scheduled weekly.  Cecile Coder, [facility]                                                                                                                   Cecile Coder, [facility]               Cecile Coder, [facility]               Cecile Coder, [facility]               Cecile Coder, [facility]               Cecile Coder, [facility]               Cecile Coder, [facility]               Cecile Coder, [facility]               Cecile Coder,  [facility]  [place] [facility] Counselor/Therapist Progress Note  Patient ID: Ausencio Dwyer, MRN: [id number],    Date: 03/04/2024 This session was held via video teletherapy. The patient consented to the video teletherapy and was located in his home during this session. He is aware it is the responsibility of the patient to secure confidentiality on his end of the session. The provider was in a private home office for the duration of this session.    The patient arrived on time for her Caregility session  Time Spent: 59 minutes, 2 PM to 2:59 PM  Treatment Type: Individual Therapy Reported Symptoms: anxiety, panic attacks The patient said he jokingly said to his wife that he had not had any pain issues in the cervical area and  then a few days ago he started having pain in his neck and shoulders.  He now sits related to degenerative disk and has some consistent pain somewhere in his spine with that but jokingly says it just depends on Wednesday which part of his body is decides to show up and hurt.  He is doing the best that he can.  He is still walking which he knows is good for him but does not have right now the strength to do as much in the morning as he has done in the past so he is denying that to 3 times a day for consistency.  He is still doing some positive things and that he is staying in touch with his brother and other family members.  He is reconnecting with friends from high school and he is which she is enjoying.  One of them has had a similar experience in that she lost her son in the same way the patient lost his daughter.  He said they did not think and Easter in which they set a place for his daughter at the table and put her picture there.  He initially had reservations about doing that he and his wife had made everyone feel as if she was really there with him.  He still feels that he is grieving in a healthy way.  He and his wife have always taken a trip around both of their birthdays which are 2 days apart.  That is now close to his daughter died so they have started taking some of her rashes with him and sprinkling them where they go as a way of remembrance.  They are deciding what that looks like this year in July.  He does contract for safety having no thoughts of hurting himself or anyone else. We will continue to work on coping skills for helping with his anxiety and the progression of his Lewy body dementia diagnosis as well as processing his grief. 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  Speech/Language:  Normal Rate  Affect: Appropriate  Mood: normal  Thought process: normal  Thought content:   WNL   Sensory/Perceptual disturbances:   WNL  Orientation: oriented to person, place, and situation  Attention: Good  Concentration: Good  Memory: Impaired short term  Fund of knowledge:  Good  Insight:   Good  Judgment:  Good  Impulse Control: Good   Risk Assessment: Danger to Self:  No Self-injurious Behavior: No Danger to Others: No Duty to Warn:no Physical Aggression / Violence:No  Access to Firearms a concern: No  Gang Involvement:No  Treatment plan: Will employ cognitive behavioral therapy as  well as grief and loss therapy for relief of anxiety and grief.  Goals of grief therapy or to have a healthier response to the loss of his daughter and less sadness as evidenced by patient report in therapy notes as well as to help him feel less overwhelmed by her loss.  Goals are to see a 50% reduction in grief symptoms over the next 6 months.  I will encourage the use of the patient telling his grief story about his daughter including encouraging him to bring pictures and memorabilia to help process his feelings including any feelings of guilt associated with her loss.  We will use cognitive behavioral therapy to help identify and change anxiety producing thoughts and behavior patterns so as to improve his ability to better manage anxiety and stress, and manage thoughts and worrisome thinking contributing to anxiety.  We will also refresh and encourage dialectical behavior therapy distress tolerance and mindfulness skills with the intention of reducing anxiety by 50% over the next 6 months. Progress: 30% Interventions: Cognitive Behavioral Therapy  Diagnosis:PTSD, Bi-Polar D/O  Plan: F/U/ appointments scheduled weekly.  Cecile Coder, [facility]                                                                                                                   Cecile Coder,  [facility]  [place] [facility] Counselor/Therapist Progress Note  Patient ID: Ramel Rodela, MRN: [id number],    Date: 03/04/2024 This session was held via video teletherapy. The patient consented to the video teletherapy and was located in his home during this session. He is aware it is the responsibility of the patient to secure confidentiality on his end of the session. The provider was in a private home office for the duration of this session.    The patient arrived on time for her Caregility session  Time Spent: 58 minutes, 2 PM to 2:58 PM  Treatment Type: Individual Therapy Reported Symptoms: anxiety, panic attacks A fairly difficult week because it was his daughter's birthday.  He said this year was more  difficult than last year but he could not pinpoint why.  They did remember her about going out to a nice restaurant which is something her daughter always wanted to do.  He said they were able to laugh and joke about funny things that his daughter had said or done which is not something they were able to do this time last year so he saw that as healthy grieving.  He knows that the time change threw things off a little bit for everybody and his wife still is not feeling well and his back has been bothering him.  He was not able to go to his first round of physical therapy this week because his back was bothering him more.  He has been thinking a lot more about his daughter which he feels comfortable with.  He is thankful that it is getting warmer and staying light longer so he can get back to some of the coping skills such as fishing that he has done in the past.   We will continue to work on coping skills for helping with his anxiety and the progression of his Lewy body dementia diagnosis as well as processing his grief. 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  Speech/Language:  Normal Rate  Affect: Appropriate  Mood: normal  Thought process: normal  Thought content:   WNL  Sensory/Perceptual disturbances:   WNL  Orientation: oriented to person, place, and situation  Attention: Good  Concentration: Good  Memory: Impaired short term  Fund of knowledge:  Good  Insight:   Good  Judgment:  Good  Impulse Control: Good   Risk Assessment: Danger to Self:  No Self-injurious Behavior: No Danger to Others: No Duty to Warn:no Physical Aggression / Violence:No  Access to Firearms a concern: No  Gang Involvement:No  Treatment plan: Will employ cognitive behavioral therapy as well as grief and loss therapy for relief of anxiety and grief.  Goals of grief therapy or to have a healthier response to the loss  of his daughter and less sadness as evidenced by patient report in therapy notes as well as to help him feel less overwhelmed by her loss.  Goals are to see a 50% reduction in grief symptoms over the next 6 months.  I will encourage the use of the patient telling his grief story about his daughter including encouraging him to bring pictures and memorabilia to help process his feelings including any feelings of guilt associated with her loss.  We will use cognitive behavioral therapy to help identify and change anxiety producing thoughts and behavior patterns so as to improve his ability to better manage anxiety and stress, and manage thoughts  and worrisome thinking contributing to anxiety.  We will also refresh and encourage dialectical behavior therapy distress tolerance and mindfulness skills with the intention of reducing anxiety by 50% over the next 6 months. Progress: 30% Interventions: Cognitive Behavioral Therapy  Diagnosis:PTSD, Bi-Polar D/O  Plan: F/U/ appointments scheduled weekly.  Cecile Coder, [facility]                                                                                                                   Cecile Coder, [facility]               Cecile Coder, [facility]               Cecile Coder,  [facility]  [facility] Counselor/Therapist Progress Note  Patient ID: Fiore Frangella, MRN: [id number],    Date: 03/04/2024 This session was held via video teletherapy. The patient consented to the video teletherapy and was located in his home during this session. He is aware it is the responsibility of the patient to secure confidentiality on his end of the session. The provider was in a private home office for the duration of this session.    The patient arrived on time for her Caregility session  Time Spent: 1:05 PM until 2 PM, 55 minutes Treatment Type: Individual Therapy Reported Symptoms: anxiety, panic attacks The patient said he came in from his walk this morning and saw what he thought was some sort of tic from his wife.  It  was not the first time it happened and he wonders if it might be tardive dyskinesia because of some new medications that she is on.  She has a call into the doctor right now and hopes to hear something back soon.  They have narrowed down to the fact they think most of her discomfort in breathing is coming from the diaphragm and they are looking for alternatives for treatment.  The patient continues to do those things which help him cope including walking, fishing when he can, spending time with his son and daughter-in-law and grandson.  He also has a way of continued coping with grief he is writing letters to his daughters as a way of expressing his feelings and says that is very beneficial.  He appears to be grieving in a healthy way but he knows that he to year anniversary of her death is coming up and there is some grief there but says it is not overwhelming.  We will continue to work on coping skills for helping with his anxiety and the progression of his Lewy body dementia diagnosis as well as processing his grief. 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  Speech/Language:  Normal Rate  Affect: Appropriate  Mood: normal  Thought process: normal  Thought content:   WNL  Sensory/Perceptual disturbances:   WNL  Orientation: oriented to person, place, and situation  Attention: Good  Concentration: Good  Memory: Impaired short term  Fund of knowledge:  Good  Insight:   Good  Judgment:  Good  Impulse Control: Good   Risk Assessment: Danger to Self:  No Self-injurious Behavior: No Danger to Others: No Duty to Warn:no Physical Aggression / Violence:No  Access to Firearms a concern: No  Gang Involvement:No  Treatment plan: Will employ cognitive behavioral therapy as well as grief and loss therapy for relief of anxiety and grief.  Goals of grief therapy or to have a healthier response to the loss  of his daughter and less sadness as evidenced by patient report in therapy notes as well as to help him feel less overwhelmed by her loss.  Goals are to see a 50% reduction in grief symptoms over the next 6 months.  I will encourage the use of the patient telling his grief story about his daughter including encouraging him to bring pictures and memorabilia to help process his feelings including any feelings of guilt associated with her loss.  We will use cognitive behavioral therapy to help identify and change anxiety producing thoughts and behavior patterns so as to improve his ability to better manage anxiety and stress, and manage thoughts and worrisome  thinking contributing to anxiety.  We will also refresh and encourage dialectical behavior therapy distress tolerance and mindfulness skills with the intention of reducing anxiety by 50% over the next 6 months. Progress: 30% Interventions: Cognitive Behavioral Therapy  Diagnosis:PTSD, Bi-Polar D/O  Plan: F/U/ appointments scheduled weekly.  Cecile Coder, [facility]                                                                                                                   Cecile Coder,  [facility]  [facility] Counselor/Therapist Progress Note  Patient ID: Jawanza Volesky, MRN: [id number],    Date: 03/04/2024 This session was held via video teletherapy. The patient consented to the video teletherapy and was located in his home during this session. He is aware it is the responsibility of the patient to secure confidentiality on his end of the session. The provider was in a private home office for the duration of this session.    The patient arrived on time for her Caregility session  Time Spent: 59 minutes, 2 PM to 2:59 PM  Treatment Type: Individual Therapy Reported Symptoms: anxiety, panic attacks The patient continues to present with physical pain and both his back and his knee.  He started  with a new physical therapist and told him that there was a lot of arthritis in both knees and or certain things he cannot do but they had him do that anyway and now for the past 3 days he has been in significant knee pain.  He has continued to walk because he knows that is good for him in various ways but says it has been painful.  His wife also was not feeling well but she is going back to the doctor next week to have an ablation and hopes that will bring her some relief.  For the most part he has been at the last couple weeks reaching back out and hearing from some old friends as well as time with his son and son's family.  He says that his wife's cousin is coming down for a weekend soon and they enjoy time with her and that he will go to his brother's house sometime in the next few weeks.  He recognizes the need for people that he cares about in helping with his depression. We will continue to work on coping skills for helping with his anxiety and the progression of his Lewy body dementia diagnosis as well as processing his grief. 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  Speech/Language:  Normal Rate  Affect: Appropriate  Mood: normal  Thought process: normal  Thought content:   WNL  Sensory/Perceptual disturbances:   WNL  Orientation: oriented to person, place, and situation  Attention: Good  Concentration: Good  Memory: Impaired short term  Fund of knowledge:  Good  Insight:   Good  Judgment:  Good  Impulse Control: Good   Risk Assessment: Danger to Self:  No Self-injurious Behavior: No Danger to Others: No Duty to Warn:no Physical Aggression / Violence:No  Access to Firearms a concern: No  Gang Involvement:No  Treatment plan: Will employ cognitive behavioral therapy as well as grief and loss therapy for relief of anxiety and grief.  Goals of grief therapy or to have a healthier response  to the loss of his daughter and less sadness as evidenced by patient report in therapy notes as well as to help him feel less overwhelmed by her loss.  Goals are to see a 50% reduction in grief symptoms over the next 6 months.  I will encourage the use of the patient telling his grief story about his daughter including encouraging him to bring pictures and memorabilia to help process his feelings including any feelings of guilt associated with her loss.  We will use cognitive behavioral therapy to help identify and change anxiety producing thoughts and behavior patterns so as to improve his ability to better  manage anxiety and stress, and manage thoughts and worrisome thinking contributing to anxiety.  We will also refresh and encourage dialectical behavior therapy distress tolerance and mindfulness skills with the intention of reducing anxiety by 50% over the next 6 months. Progress: 30% Interventions: Cognitive Behavioral Therapy  Diagnosis:PTSD, Bi-Polar D/O  Plan: F/U/ appointments scheduled weekly.  Cecile Coder, [facility]                                                                                                                   Cecile Coder, [facility]               Cecile Coder, [facility]               Cecile Coder, [facility]               Cecile Coder,  [facility]  [place] [facility] Counselor/Therapist Progress Note  Patient ID: Pearce Herbin, MRN: [id number],    Date: 03/04/2024 This session was held via video teletherapy. The patient consented to the video teletherapy and was located in his home during this session. He is aware it is the responsibility of the patient to secure confidentiality on his end of the session. The provider was in a private home office for the duration of this session.    The patient arrived on time for her Caregility session  Time Spent: 57 minutes, 2 PM to 2:57 PM  Treatment Type: Individual Therapy Reported Symptoms: anxiety, panic attacks The patient is still having significant back pain.  He cannot sit anywhere for very long and has to be  careful where he sits.  There is still some unsteadiness although he did not report any falls over the past 2 weeks.  His biggest concern now is for his wife.  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  She is meeting with her cardiologist again next week to see what possible treatment is for her.  He still thinks there may be some issue with her diaphragm but she is meeting with the pulmonologist also.  He is doing what he can to help her.  His daughter's birthday is March 11 so he knows that is a difficult time for both he and his wife next week.  Typically they do something to remember her but because of the wife's current health issues they cannot go to [place] as they had planned so they are talking about what to do to remember and celebrate her birthday.  We will continue to work on coping skills for helping with his anxiety and the progression of his Lewy body dementia diagnosis as well as processing his grief. 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  Speech/Language:  Normal Rate  Affect: Appropriate  Mood: normal  Thought process: normal  Thought content:   WNL  Sensory/Perceptual disturbances:   WNL  Orientation: oriented to person, place, and situation  Attention: Good  Concentration: Good  Memory: Impaired short term  Fund of knowledge:  Good  Insight:   Good  Judgment:  Good  Impulse Control: Good   Risk Assessment: Danger to Self:  No Self-injurious Behavior: No Danger to Others: No Duty to Warn:no Physical Aggression / Violence:No  Access to Firearms a concern: No  Gang Involvement:No  Treatment plan: Will employ cognitive behavioral therapy as well as grief and loss therapy for relief of anxiety and grief.  Goals of grief therapy or to have a healthier response to the loss of his daughter  and less sadness as evidenced by patient report in therapy notes as well as to help him feel less overwhelmed by her loss.  Goals are to see a 50% reduction in grief symptoms over the next 6 months.  I will encourage the use of the patient telling his grief story about his daughter including encouraging him to bring pictures and memorabilia to help process his feelings including any feelings of guilt associated with her loss.  We will use cognitive behavioral therapy to help identify and change anxiety producing thoughts and behavior patterns so as to improve his ability to better manage anxiety and stress, and manage thoughts and worrisome thinking contributing  to anxiety.  We will also refresh and encourage dialectical behavior therapy distress tolerance and mindfulness skills with the intention of reducing anxiety by 50% over the next 6 months. Progress: 30% Interventions: Cognitive Behavioral Therapy  Diagnosis:PTSD, Bi-Polar D/O  Plan: F/U/ appointments scheduled weekly.  Cecile Coder, [facility]                                                                                                                   Cecile Coder,  [facility]  [facility] [facility] Counselor/Therapist Progress Note  Patient ID: Stephen Myers, MRN: [id number],    Date: 03/04/2024 This session was held via video teletherapy. The patient consented to the video teletherapy and was located in his home during this session. He is aware it is the responsibility of the patient to secure confidentiality on his end of the session. The provider was in a private home office for the duration of this session.    The patient arrived on time for her Caregility session  Time Spent: 58 minutes, 2 PM to 2:58 PM  Treatment Type: Individual Therapy Reported Symptoms: anxiety, panic attacks A fairly difficult week because it was his daughter's birthday.  He said this year was more  difficult than last year but he could not pinpoint why.  They did remember her about going out to a nice restaurant which is something her daughter always wanted to do.  He said they were able to laugh and joke about funny things that his daughter had said or done which is not something they were able to do this time last year so he saw that as healthy grieving.  He knows that the time change threw things off a little bit for everybody and his wife still is not feeling well and his back has been bothering him.  He was not able to go to his first round of physical therapy this week because his back was bothering him more.  He has been thinking a lot more about his daughter which he feels comfortable with.  He is thankful that it is getting warmer and staying light longer so he can get back to some of the coping skills such as fishing that he has done in the past.   We will continue to work on coping skills for helping with his anxiety and the progression of his Lewy body dementia diagnosis as well as processing his grief. 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  Speech/Language:  Normal Rate  Affect: Appropriate  Mood: normal  Thought process: normal  Thought content:   WNL  Sensory/Perceptual disturbances:   WNL  Orientation: oriented to person, place, and situation  Attention: Good  Concentration: Good  Memory: Impaired short term  Fund of knowledge:  Good  Insight:   Good  Judgment:  Good  Impulse Control: Good   Risk Assessment: Danger to Self:  No Self-injurious Behavior: No Danger to Others: No Duty to Warn:no Physical Aggression / Violence:No  Access to Firearms a concern: No  Gang Involvement:No  Treatment plan: Will employ cognitive behavioral therapy as well as grief and loss therapy for relief of anxiety and grief.  Goals of grief therapy or to have a healthier response to the loss  of his daughter and less sadness as evidenced by patient report in therapy notes as well as to help him feel less overwhelmed by her loss.  Goals are to see a 50% reduction in grief symptoms over the next 6 months.  I will encourage the use of the patient telling his grief story about his daughter including encouraging him to bring pictures and memorabilia to help process his feelings including any feelings of guilt associated with her loss.  We will use cognitive behavioral therapy to help identify and change anxiety producing thoughts and behavior patterns so as to improve his ability to better manage anxiety and stress, and manage thoughts  and worrisome thinking contributing to anxiety.  We will also refresh and encourage dialectical behavior therapy distress tolerance and mindfulness skills with the intention of reducing anxiety by 50% over the next 6 months. Progress: 30% Interventions: Cognitive Behavioral Therapy  Diagnosis:PTSD, Bi-Polar D/O  Plan: F/U/ appointments scheduled weekly.  Cecile Coder, [facility]                                                                                                                   Cecile Coder, [facility]               Cecile Coder, [facility]               Cecile Coder,  [facility]  [place] [facility] Counselor/Therapist Progress Note  Patient ID: Stephen Myers, MRN: [id number],    Date: 03/04/2024 This session was held via video teletherapy. The patient consented to the video teletherapy and was located in his home during this session. He is aware it is the responsibility of the patient to secure confidentiality on his end of the session. The provider was in a private home office for the duration of this session.    The patient arrived on time for her Caregility session  Time Spent: 57 minutes, 2 PM to 2:57 PM  Treatment Type: Individual Therapy Reported Symptoms: anxiety, panic attacks The patient is still having significant back pain.  He cannot sit anywhere for very long and has to be  careful where he sits.  There is still some unsteadiness although he did not report any falls over the past 2 weeks.  His biggest concern now is for his wife.  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  She is meeting with her cardiologist again next week to see what possible treatment is for her.  He still thinks there may be some issue with her diaphragm but she is meeting with the pulmonologist also.  He is doing what he can to help her.  His daughter's birthday is March 11 so he knows that is a difficult time for both he and his wife next week.  Typically they do something to remember her but because of the wife's current health issues they cannot go to [place] as they had planned so they are talking about what to do to remember and celebrate her birthday.  We will continue to work on coping skills for helping with his anxiety and the progression of his Lewy body dementia diagnosis as well as processing his grief. 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  Speech/Language:  Normal Rate  Affect: Appropriate  Mood: normal  Thought process: normal  Thought content:   WNL  Sensory/Perceptual disturbances:   WNL  Orientation: oriented to person, place, and situation  Attention: Good  Concentration: Good  Memory: Impaired short term  Fund of knowledge:  Good  Insight:   Good  Judgment:  Good  Impulse Control: Good   Risk Assessment: Danger to Self:  No Self-injurious Behavior: No Danger to Others: No Duty to Warn:no Physical Aggression / Violence:No  Access to Firearms a concern: No  Gang Involvement:No  Treatment plan: Will employ cognitive behavioral therapy as well as grief and loss therapy for relief of anxiety and grief.  Goals of grief therapy or to have a healthier response to the loss of his daughter  and less sadness as evidenced by patient report in therapy notes as well as to help him feel less overwhelmed by her loss.  Goals are to see a 50% reduction in grief symptoms over the next 6 months.  I will encourage the use of the patient telling his grief story about his daughter including encouraging him to bring pictures and memorabilia to help process his feelings including any feelings of guilt associated with her loss.  We will use cognitive behavioral therapy to help identify and change anxiety producing thoughts and behavior patterns so as to improve his ability to better manage anxiety and stress, and manage thoughts and worrisome thinking contributing  to anxiety.  We will also refresh and encourage dialectical behavior therapy distress tolerance and mindfulness skills with the intention of reducing anxiety by 50% over the next 6 months. Progress: 30% Interventions: Cognitive Behavioral Therapy  Diagnosis:PTSD, Bi-Polar D/O  Plan: F/U/ appointments scheduled weekly.  Cecile Coder, [facility]                                                                                                                   Cecile Coder,  [facility]  [place] [facility] Counselor/Therapist Progress Note  Patient ID: Stephen Myers, MRN: [id number],    Date: 03/04/2024 This session was held via video teletherapy. The patient consented to the video teletherapy and was located in his home during this session. He is aware it is the responsibility of the patient to secure confidentiality on his end of the session. The provider was in a private home office for the duration of this session.    The patient arrived on time for her Caregility session  Time Spent: 57 minutes, 2 PM to 2:57 PM  Treatment Type: Individual Therapy Reported Symptoms: anxiety, panic attacks  We will continue to work on coping skills for helping with his anxiety and the progression of his  Lewy body dementia diagnosis as well as processing his grief. 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  Speech/Language:  Normal Rate  Affect: Appropriate  Mood: normal  Thought process: normal  Thought content:   WNL  Sensory/Perceptual disturbances:   WNL  Orientation: oriented to person, place, and situation  Attention: Good  Concentration: Good  Memory: Impaired short term  Fund of knowledge:  Good  Insight:   Good  Judgment:  Good  Impulse Control: Good   Risk Assessment: Danger to Self:  No Self-injurious Behavior: No Danger to Others: No Duty to Warn:no Physical Aggression / Violence:No  Access to Firearms a concern: No  Gang Involvement:No  Treatment plan: Will employ cognitive behavioral therapy as well as grief and loss therapy for relief of anxiety and grief.  Goals of grief therapy or to have a healthier response to the loss of his daughter and less sadness as evidenced by patient report in therapy notes as well as to help him feel less overwhelmed by her loss.  Goals are to see a 50% reduction in grief symptoms over the next 6 months.  I will encourage the use of the patient telling his grief story about his daughter including encouraging him to bring pictures and memorabilia to help process his feelings including any feelings of guilt associated with her loss.  We will use cognitive behavioral therapy to help identify and change anxiety producing thoughts and behavior patterns so as to improve his ability to better manage anxiety and stress, and manage thoughts and worrisome thinking contributing to anxiety.  We will also refresh and encourage dialectical behavior therapy distress tolerance and mindfulness skills with the intention of reducing anxiety by 50% over the next 6 months. Progress: 30% Interventions: Cognitive Behavioral Therapy  Diagnosis:PTSD, Bi-Polar D/O  Plan:  F/U/ appointments scheduled weekly.  Cecile Coder, [facility]                                                                                                                   Cecile Coder, [facility]               Cecile Coder, [facility]  Cecile Coder, [facility]               Cecile Coder, [facility]               Cecile Coder, [facility]               Cecile Coder, [facility]               Cecile Coder, [facility]  [place] [facility] Counselor/Therapist Progress Note  Patient ID: Darnel Delariva, MRN: [id number],    Date:  03/04/2024 This session was held via video teletherapy. The patient consented to the video teletherapy and was located in his home during this session. He is aware it is the responsibility of the patient to secure confidentiality on his end of the session. The provider was in a private home office for the duration of this session.    The patient arrived on time for her Caregility session  Time Spent: 39 minutes, 2 PM to 2:39 PM  Treatment Type: Individual Therapy Reported Symptoms: anxiety, panic attacks The patient was not feeling well today.  He started feeling really earlier in the week having a fever bad headaches body aches.  He felt a little better yesterday but feels a little bit of a resurgence in headaches and body aches and is running a low-grade fever.  His wife is feeling some of the same symptoms.  For the most part he says he is doing well.  They did increase his memory medication because they were starting to see some increasing decline in short-term memory and he said until recently his long-term memory had been good but he was starting to see a difference in how he used to markers to remember what happened at certain times historically.  He feels the medication has helped and has brightened his mood a little also.  He is still trying to walk every day and fish when he can and has enjoyed watching the [employer] as a good distraction.  He reports that he feels he is managing mood fairly well.  He has an appointment in 2 weeks.  He does contract for safety having no thoughts of hurting himself or anyone else. We will continue to work on coping skills for helping with his anxiety and the progression of his Lewy body dementia diagnosis as well as processing his grief. 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  Speech/Language:  Normal Rate  Affect: Appropriate  Mood: normal   Thought process: normal  Thought content:   WNL  Sensory/Perceptual disturbances:   WNL  Orientation: oriented to person, place, and situation  Attention: Good  Concentration: Good  Memory: Impaired short term  Fund of knowledge:  Good  Insight:   Good  Judgment:  Good  Impulse Control: Good   Risk Assessment: Danger to Self:  No Self-injurious Behavior: No Danger to Others: No Duty to Warn:no Physical Aggression / Violence:No  Access to Firearms a concern: No  Gang Involvement:No  Treatment plan: Will employ cognitive behavioral therapy as well as grief and loss therapy for relief of anxiety and grief.  Goals of grief therapy or to have a healthier response to the loss of his daughter and less sadness as evidenced by patient report in therapy notes as well as to help him feel less overwhelmed by her loss.  Goals are to see a 50% reduction in grief symptoms over the next 6 months.  I will encourage the use of the patient telling his grief story about his daughter including encouraging him to bring pictures and memorabilia to help process his feelings including any feelings of guilt associated with her loss.  We will use cognitive behavioral therapy to help identify and change anxiety producing thoughts and behavior patterns so as to improve his ability to better manage anxiety  and stress, and manage thoughts and worrisome thinking contributing to anxiety.  We will also refresh and encourage dialectical behavior therapy distress tolerance and mindfulness skills with the intention of reducing anxiety by 50% over the next 6 months. Progress: 30% Interventions: Cognitive Behavioral Therapy  Diagnosis:PTSD, Bi-Polar D/O  Plan: F/U/ appointments scheduled weekly.  Cecile Coder,  [facility]                                                                                                                   Cecile Coder, [facility]  [place] [facility] Counselor/Therapist Progress Note  Patient ID: Cambell Yeakey, MRN: [id number],    Date: 03/04/2024 This session was held via video teletherapy. The patient consented to the video teletherapy and was located in his home during this session. He is aware it is the responsibility of the patient to secure confidentiality on his end of the session. The provider was in a private home office for the duration of this session.    The patient arrived on time  for her Caregility session  Time Spent: 58 minutes, 2 PM to 2:58 PM  Treatment Type: Individual Therapy Reported Symptoms: anxiety, panic attacks A fairly difficult week because it was his daughter's birthday.  He said this year was more difficult than last year but he could not pinpoint why.  They did remember her about going out to a nice restaurant which is something her daughter always wanted to do.  He said they were able to laugh and joke about funny things that his daughter had said or done which is not something they were able to do this time last year so he saw that as healthy grieving.  He knows that the time change threw things off a little bit for everybody and his wife still is not feeling well and his back has been bothering him.  He was not able to go to his first round of physical therapy this week because his back was bothering him more.  He has been thinking a lot more about his daughter which he feels comfortable with.  He is thankful that it is getting warmer and staying light longer so he can get back to some of the coping skills such as fishing that he has done in the past.   We will continue to work on coping skills for helping with his anxiety and the progression of his Lewy body dementia diagnosis as well as processing his grief. Mental Status Exam: Appearance:  Well Groomed     Behavior: Appropriate  Motor: Pt. Reports some instability due to Lewey Body dementia  Speech/Language:  Normal Rate  Affect: Appropriate  Mood: normal  Thought process: normal  Thought content:   WNL  Sensory/Perceptual disturbances:   WNL  Orientation: oriented to person, place, and situation  Attention: Good  Concentration: Good  Memory: Impaired short term  Fund of knowledge:  Good  Insight:   Good  Judgment:  Good  Impulse Control: Good   Risk Assessment: Danger to Self:  No Self-injurious Behavior: No Danger to Others: No Duty to Warn:no Physical Aggression / Violence:No   Access to Firearms a concern: No  Gang Involvement:No  Treatment plan: Will employ cognitive behavioral therapy as well as grief and loss therapy for relief of anxiety and grief.  Goals of grief therapy or to have a healthier response to the loss of his daughter and less sadness as evidenced by patient report in therapy notes as well as to help him feel less overwhelmed by her loss.  Goals are to see a 50% reduction in grief symptoms over the next 6 months.  I will encourage the use of the patient telling his grief story about his daughter including encouraging him to bring pictures and memorabilia to help process his feelings including any feelings of guilt associated with her loss.  We will use cognitive behavioral therapy to help identify and change anxiety producing thoughts and behavior patterns so as to improve his ability to better manage anxiety and stress, and manage thoughts  and worrisome thinking contributing to anxiety.  We will also refresh and encourage dialectical behavior therapy distress tolerance and mindfulness skills with the intention of reducing anxiety by 50% over the next 6 months. Progress: 30% Interventions: Cognitive Behavioral Therapy  Diagnosis:PTSD, Bi-Polar D/O  Plan: F/U/ appointments scheduled weekly.  Cecile Coder, [facility]                                                                                                                   Cecile Coder, [facility]               Cecile Coder, [facility]               Cecile Coder,  [facility]  [place] [facility] Counselor/Therapist Progress Note  Patient ID: Stephen Myers, MRN: [id number],    Date: 03/04/2024 This session was held via video teletherapy. The patient consented to the video teletherapy and was located in his home during this session. He is aware it is the responsibility of the patient to secure confidentiality on his end of the session. The provider was in a private home office for the duration of this session.    The patient arrived on time for her Caregility session  Time Spent: 1:05 PM until 2 PM, 55 minutes Treatment Type: Individual Therapy Reported Symptoms: anxiety, panic attacks The patient said he came in from his walk this morning and saw what he thought was some sort of tic from his wife.  It  was not the first time it happened and he wonders if it might be tardive dyskinesia because of some new medications that she is on.  She has a call into the doctor right now and hopes to hear something back soon.  They have narrowed down to the fact they think most of her discomfort in breathing is coming from the diaphragm and they are looking for alternatives for treatment.  The patient continues to do those things which help him cope including walking, fishing when he can, spending time with his son and daughter-in-law and grandson.  He also has a way of continued coping with grief he is writing letters to his daughters as a way of expressing his feelings and says that is very beneficial.  He appears to be grieving in a healthy way but he knows that he to year anniversary of her death is coming up and there is some grief there but says it is not overwhelming.  We will continue to work on coping skills for helping with his anxiety and the progression of his Lewy body dementia diagnosis as well as processing his grief. Mental Status Exam: Appearance:  Well Groomed     Behavior: Appropriate  Motor: Pt. 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  Speech/Language:  Normal Rate  Affect: Appropriate  Mood: normal  Thought process: normal  Thought content:   WNL  Sensory/Perceptual disturbances:   WNL  Orientation: oriented to person, place, and situation  Attention: Good  Concentration: Good  Memory: Impaired short term  Fund of knowledge:  Good  Insight:   Good  Judgment:  Good  Impulse Control: Good   Risk Assessment: Danger to Self:  No Self-injurious Behavior: No Danger to Others: No Duty to Warn:no Physical Aggression / Violence:No  Access to Firearms a concern: No  Gang Involvement:No  Treatment plan: Will employ cognitive behavioral therapy as well as grief and loss therapy for relief of anxiety and grief.  Goals of grief therapy or to have a healthier response to the loss  of his daughter and less sadness as evidenced by patient report in therapy notes as well as to help him feel less overwhelmed by her loss.  Goals are to see a 50% reduction in grief symptoms over the next 6 months.  I will encourage the use of the patient telling his grief story about his daughter including encouraging him to bring pictures and memorabilia to help process his feelings including any feelings of guilt associated with her loss.  We will use cognitive behavioral therapy to help identify and change anxiety producing thoughts and behavior patterns so as to improve his ability to better manage anxiety and stress, and manage thoughts and worrisome  thinking contributing to anxiety.  We will also refresh and encourage dialectical behavior therapy distress tolerance and mindfulness skills with the intention of reducing anxiety by 50% over the next 6 months. Progress: 30% Interventions: Cognitive Behavioral Therapy  Diagnosis:PTSD, Bi-Polar D/O  Plan: F/U/ appointments scheduled weekly.  Cecile Coder, [facility]                                                                                                                   Cecile Coder,  [facility]  [place] [facility] Counselor/Therapist Progress Note  Patient ID: Amarr Pline, MRN: [id number],    Date: 03/04/2024 This session was held via video teletherapy. The patient consented to the video teletherapy and was located in his home during this session. He is aware it is the responsibility of the patient to secure confidentiality on his end of the session. The provider was in a private home office for the duration of this session.    The patient arrived on time for her Caregility session  Time Spent: 59 minutes, 2 PM to 2:59 PM  Treatment Type: Individual Therapy Reported Symptoms: anxiety, panic attacks The patient continues to present with physical pain and both his back and his knee.  He started  with a new physical therapist and told him that there was a lot of arthritis in both knees and or certain things he cannot do but they had him do that anyway and now for the past 3 days he has been in significant knee pain.  He has continued to walk because he knows that is good for him in various ways but says it has been painful.  His wife also was not feeling well but she is going back to the doctor next week to have an ablation and hopes that will bring her some relief.  For the most part he has been at the last couple weeks reaching back out and hearing from some old friends as well as time with his son and son's family.  He says that his wife's cousin is coming down for a weekend soon and they enjoy time with her and that he will go to his brother's house sometime in the next few weeks.  He recognizes the need for people that he cares about in helping with his depression. We will continue to work on coping skills for helping with his anxiety and the progression of his Lewy body dementia diagnosis as well as processing his grief. Mental Status Exam: Appearance:  Well Groomed     Behavior: Appropriate  Motor: Pt. 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  Speech/Language:  Normal Rate  Affect: Appropriate  Mood: normal  Thought process: normal  Thought content:   WNL  Sensory/Perceptual disturbances:   WNL  Orientation: oriented to person, place, and situation  Attention: Good  Concentration: Good  Memory: Impaired short term  Fund of knowledge:  Good  Insight:   Good  Judgment:  Good  Impulse Control: Good   Risk Assessment: Danger to Self:  No Self-injurious Behavior: No Danger to Others: No Duty to Warn:no Physical Aggression / Violence:No  Access to Firearms a concern: No  Gang Involvement:No  Treatment plan: Will employ cognitive behavioral therapy as well as grief and loss therapy for relief of anxiety and grief.  Goals of grief therapy or to have a healthier response  to the loss of his daughter and less sadness as evidenced by patient report in therapy notes as well as to help him feel less overwhelmed by her loss.  Goals are to see a 50% reduction in grief symptoms over the next 6 months.  I will encourage the use of the patient telling his grief story about his daughter including encouraging him to bring pictures and memorabilia to help process his feelings including any feelings of guilt associated with her loss.  We will use cognitive behavioral therapy to help identify and change anxiety producing thoughts and behavior patterns so as to improve his ability to better  manage anxiety and stress, and manage thoughts and worrisome thinking contributing to anxiety.  We will also refresh and encourage dialectical behavior therapy distress tolerance and mindfulness skills with the intention of reducing anxiety by 50% over the next 6 months. Progress: 30% Interventions: Cognitive Behavioral Therapy  Diagnosis:PTSD, Bi-Polar D/O  Plan: F/U/ appointments scheduled weekly.  Cecile Coder, [facility]                                                                                                                   Cecile Coder, [facility]               Cecile Coder, [facility]               Cecile Coder, [facility]               Cecile Coder,  [facility]  [place] [facility] Counselor/Therapist Progress Note  Patient ID: Tyr Pearce, MRN: [id number],    Date: 03/04/2024 This session was held via video teletherapy. The patient consented to the video teletherapy and was located in his home during this session. He is aware it is the responsibility of the patient to secure confidentiality on his end of the session. The provider was in a private home office for the duration of this session.    The patient arrived on time for her Caregility session  Time Spent: 57 minutes, 2 PM to 2:57 PM  Treatment Type: Individual Therapy Reported Symptoms: anxiety, panic attacks The patient is still having significant back pain.  He cannot sit anywhere for very long and has to be  careful where he sits.  There is still some unsteadiness although he did not report any falls over the past 2 weeks.  His biggest concern now is for his wife.  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  She is meeting with her cardiologist again next week to see what possible treatment is for her.  He still thinks there may be some issue with her diaphragm but she is meeting with the pulmonologist also.  He is doing what he can to help her.  His daughter's birthday is March 11 so he knows that is a difficult time for both he and his wife next week.  Typically they do something to remember her but because of the wife's current health issues they cannot go to [place] as they had planned so they are talking about what to do to remember and celebrate her birthday.  We will continue to work on coping skills for helping with his anxiety and the progression of his Lewy body dementia diagnosis as well as processing his grief. Mental Status Exam: Appearance:  Well Groomed     Behavior: Appropriate  Motor: Pt. 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  Speech/Language:  Normal Rate  Affect: Appropriate  Mood: normal  Thought process: normal  Thought content:   WNL  Sensory/Perceptual disturbances:   WNL  Orientation: oriented to person, place, and situation  Attention: Good  Concentration: Good  Memory: Impaired short term  Fund of knowledge:  Good  Insight:   Good  Judgment:  Good  Impulse Control: Good   Risk Assessment: Danger to Self:  No Self-injurious Behavior: No Danger to Others: No Duty to Warn:no Physical Aggression / Violence:No  Access to Firearms a concern: No  Gang Involvement:No  Treatment plan: Will employ cognitive behavioral therapy as well as grief and loss therapy for relief of anxiety and grief.  Goals of grief therapy or to have a healthier response to the loss of his daughter  and less sadness as evidenced by patient report in therapy notes as well as to help him feel less overwhelmed by her loss.  Goals are to see a 50% reduction in grief symptoms over the next 6 months.  I will encourage the use of the patient telling his grief story about his daughter including encouraging him to bring pictures and memorabilia to help process his feelings including any feelings of guilt associated with her loss.  We will use cognitive behavioral therapy to help identify and change anxiety producing thoughts and behavior patterns so as to improve his ability to better manage anxiety and stress, and manage thoughts and worrisome thinking contributing  to anxiety.  We will also refresh and encourage dialectical behavior therapy distress tolerance and mindfulness skills with the intention of reducing anxiety by 50% over the next 6 months. Progress: 30% Interventions: Cognitive Behavioral Therapy  Diagnosis:PTSD, Bi-Polar D/O  Plan: F/U/ appointments scheduled weekly.  Cecile Coder, [facility]                                                                                                                   Cecile Coder,  [facility]  [place] [facility] Counselor/Therapist Progress Note  Patient ID: Dailin Regimbal, MRN: [id number],    Date: 03/04/2024 This session was held via video teletherapy. The patient consented to the video teletherapy and was located in his home during this session. He is aware it is the responsibility of the patient to secure confidentiality on his end of the session. The provider was in a private home office for the duration of this session.    The patient arrived on time for her Caregility session  Time Spent: 58 minutes, 2 PM to 2:58 PM  Treatment Type: Individual Therapy Reported Symptoms: anxiety, panic attacks A fairly difficult week because it was his daughter's birthday.  He said this year was more  difficult than last year but he could not pinpoint why.  They did remember her about going out to a nice restaurant which is something her daughter always wanted to do.  He said they were able to laugh and joke about funny things that his daughter had said or done which is not something they were able to do this time last year so he saw that as healthy grieving.  He knows that the time change threw things off a little bit for everybody and his wife still is not feeling well and his back has been bothering him.  He was not able to go to his first round of physical therapy this week because his back was bothering him more.  He has been thinking a lot more about his daughter which he feels comfortable with.  He is thankful that it is getting warmer and staying light longer so he can get back to some of the coping skills such as fishing that he has done in the past.   We will continue to work on coping skills for helping with his anxiety and the progression of his Lewy body dementia diagnosis as well as processing his grief. Mental Status Exam: Appearance:  Well Groomed     Behavior: Appropriate  Motor: Pt. 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  Speech/Language:  Normal Rate  Affect: Appropriate  Mood: normal  Thought process: normal  Thought content:   WNL  Sensory/Perceptual disturbances:   WNL  Orientation: oriented to person, place, and situation  Attention: Good  Concentration: Good  Memory: Impaired short term  Fund of knowledge:  Good  Insight:   Good  Judgment:  Good  Impulse Control: Good   Risk Assessment: Danger to Self:  No Self-injurious Behavior: No Danger to Others: No Duty to Warn:no Physical Aggression / Violence:No  Access to Firearms a concern: No  Gang Involvement:No  Treatment plan: Will employ cognitive behavioral therapy as well as grief and loss therapy for relief of anxiety and grief.  Goals of grief therapy or to have a healthier response to the loss  of his daughter and less sadness as evidenced by patient report in therapy notes as well as to help him feel less overwhelmed by her loss.  Goals are to see a 50% reduction in grief symptoms over the next 6 months.  I will encourage the use of the patient telling his grief story about his daughter including encouraging him to bring pictures and memorabilia to help process his feelings including any feelings of guilt associated with her loss.  We will use cognitive behavioral therapy to help identify and change anxiety producing thoughts and behavior patterns so as to improve his ability to better manage anxiety and stress, and manage thoughts  and worrisome thinking contributing to anxiety.  We will also refresh and encourage dialectical behavior therapy distress tolerance and mindfulness skills with the intention of reducing anxiety by 50% over the next 6 months. Progress: 30% Interventions: Cognitive Behavioral Therapy  Diagnosis:PTSD, Bi-Polar D/O  Plan: F/U/ appointments scheduled weekly.  Cecile Coder, [facility]                                                                                                                   Cecile Coder, [facility]               Cecile Coder, [facility]               Cecile Coder,  [facility]  [place] [facility] Counselor/Therapist Progress Note  Patient ID: Styles Hennessee, MRN: [id number],    Date: 03/04/2024 This session was held via video teletherapy. The patient consented to the video teletherapy and was located in his home during this session. He is aware it is the responsibility of the patient to secure confidentiality on his end of the session. The provider was in a private home office for the duration of this session.    The patient arrived on time for her Caregility session  Time Spent: 57 minutes, 2 PM to 2:57 PM  Treatment Type: Individual Therapy Reported Symptoms: anxiety, panic attacks The patient is still having significant back pain.  He cannot sit anywhere for very long and has to be  careful where he sits.  There is still some unsteadiness although he did not report any falls over the past 2 weeks.  His biggest concern now is for his wife.  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  She is meeting with her cardiologist again next week to see what possible treatment is for her.  He still thinks there may be some issue with her diaphragm but she is meeting with the pulmonologist also.  He is doing what he can to help her.  His daughter's birthday is March 11 so he knows that is a difficult time for both he and his wife next week.  Typically they do something to remember her but because of the wife's current health issues they cannot go to [place] as they had planned so they are talking about what to do to remember and celebrate her birthday.  We will continue to work on coping skills for helping with his anxiety and the progression of his Lewy body dementia diagnosis as well as processing his grief. Mental Status Exam: Appearance:  Well Groomed     Behavior: Appropriate  Motor: Pt. 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  Speech/Language:  Normal Rate  Affect: Appropriate  Mood: normal  Thought process: normal  Thought content:   WNL  Sensory/Perceptual disturbances:   WNL  Orientation: oriented to person, place, and situation  Attention: Good  Concentration: Good  Memory: Impaired short term  Fund of knowledge:  Good  Insight:   Good  Judgment:  Good  Impulse Control: Good   Risk Assessment: Danger to Self:  No Self-injurious Behavior: No Danger to Others: No Duty to Warn:no Physical Aggression / Violence:No  Access to Firearms a concern: No  Gang Involvement:No  Treatment plan: Will employ cognitive behavioral therapy as well as grief and loss therapy for relief of anxiety and grief.  Goals of grief therapy or to have a healthier response to the loss of his daughter  and less sadness as evidenced by patient report in therapy notes as well as to help him feel less overwhelmed by her loss.  Goals are to see a 50% reduction in grief symptoms over the next 6 months.  I will encourage the use of the patient telling his grief story about his daughter including encouraging him to bring pictures and memorabilia to help process his feelings including any feelings of guilt associated with her loss.  We will use cognitive behavioral therapy to help identify and change anxiety producing thoughts and behavior patterns so as to improve his ability to better manage anxiety and stress, and manage thoughts and worrisome thinking contributing  to anxiety.  We will also refresh and encourage dialectical behavior therapy distress tolerance and mindfulness skills with the intention of reducing anxiety by 50% over the next 6 months. Progress: 30% Interventions: Cognitive Behavioral Therapy  Diagnosis:PTSD, Bi-Polar D/O  Plan: F/U/ appointments scheduled weekly.  Cecile Coder, [facility]                                                                                                                   Cecile Coder,  [facility]  [facility] Counselor/Therapist Progress Note  Patient ID: Adrin Bourdon, MRN: [id number],    Date: 03/04/2024 This session was held via video teletherapy. The patient consented to the video teletherapy and was located in his home during this session. He is aware it is the responsibility of the patient to secure confidentiality on his end of the session. The provider was in a private home office for the duration of this session.    The patient arrived on time for her Caregility session  Time Spent: 57 minutes, 2 PM to 2:57 PM  Treatment Type: Individual Therapy Reported Symptoms: anxiety, panic attacks  We will continue to work on coping skills for helping with his anxiety and the progression of his  Lewy body dementia diagnosis as well as processing his grief. Mental Status Exam: Appearance:  Well Groomed     Behavior: Appropriate  Motor: Pt. Reports some instability due to Lewey Body dementia  Speech/Language:  Normal Rate  Affect: Appropriate  Mood: normal  Thought process: normal  Thought content:   WNL  Sensory/Perceptual disturbances:   WNL  Orientation: oriented to person, place, and situation  Attention: Good  Concentration: Good  Memory: Impaired short term  Fund of knowledge:  Good  Insight:   Good  Judgment:  Good  Impulse Control: Good   Risk Assessment: Danger to Self:  No Self-injurious Behavior: No Danger to Others: No Duty to Warn:no Physical Aggression / Violence:No  Access to Firearms a concern: No  Gang Involvement:No  Treatment plan: Will employ cognitive behavioral therapy as well as grief and loss therapy for relief of anxiety and grief.  Goals of grief therapy or to have a healthier response to the loss of his daughter and less sadness as evidenced by patient report in therapy notes as well as to help him feel less overwhelmed by her loss.  Goals are to see a 50% reduction in grief symptoms over the next 6 months.  I will encourage the use of the patient telling his grief story about his daughter including encouraging him to bring pictures and memorabilia to help process his feelings including any feelings of guilt associated with her loss.  We will use cognitive behavioral therapy to help identify and change anxiety producing thoughts and behavior patterns so as to improve his ability to better manage anxiety and stress, and manage thoughts and worrisome thinking contributing to anxiety.  We will also refresh and encourage dialectical behavior therapy distress tolerance and mindfulness skills with the intention of reducing anxiety by 50% over the next 6 months. Progress: 30% Interventions: Cognitive Behavioral Therapy  Diagnosis:PTSD, Bi-Polar D/O  Plan:  F/U/ appointments scheduled weekly.  Cecile Coder, [facility]                                                                                                                   Cecile Coder, [facility]               Cecile Coder, [facility]  Cecile Coder, [facility]               Cecile Coder, [facility]               Cecile Coder, [facility]               Cecile Coder, [facility]               Cecile Coder, [facility]               Cecile Coder, [facility]               Cecile Coder, [facility]

## 2024-03-11 ENCOUNTER — Ambulatory Visit: Payer: Medicare Other | Admitting: Behavioral Health

## 2024-03-18 ENCOUNTER — Ambulatory Visit: Payer: Medicare Other | Admitting: Behavioral Health

## 2024-03-25 ENCOUNTER — Ambulatory Visit (INDEPENDENT_AMBULATORY_CARE_PROVIDER_SITE_OTHER): Payer: Medicare Other | Admitting: Behavioral Health

## 2024-03-25 ENCOUNTER — Encounter: Payer: Self-pay | Admitting: Behavioral Health

## 2024-03-25 DIAGNOSIS — F4310 Post-traumatic stress disorder, unspecified: Secondary | ICD-10-CM | POA: Diagnosis not present

## 2024-03-25 DIAGNOSIS — F3181 Bipolar II disorder: Secondary | ICD-10-CM | POA: Diagnosis not present

## 2024-04-01 ENCOUNTER — Ambulatory Visit: Payer: Medicare Other | Admitting: Behavioral Health

## 2024-04-01 ENCOUNTER — Encounter: Payer: Self-pay | Admitting: Behavioral Health

## 2024-04-01 DIAGNOSIS — F319 Bipolar disorder, unspecified: Secondary | ICD-10-CM

## 2024-04-01 DIAGNOSIS — F4310 Post-traumatic stress disorder, unspecified: Secondary | ICD-10-CM

## 2024-04-01 DIAGNOSIS — F3181 Bipolar II disorder: Secondary | ICD-10-CM

## 2024-04-01 NOTE — Progress Notes (Incomplete)
                                                                                                                                                                                                                                                                                                                                                                                                                                                                                                                                                                                                                          [facility] Counselor/Therapist Progress Note  Patient ID: Reece Fehnel, MRN: [id number],    Date: 6 /08/2024 This session was held via video teletherapy. The patient consented to the video teletherapy and was located in his home during this session. He is aware it is the responsibility of the patient to secure confidentiality on his end of the session. The provider was in a private home office for the duration of this session.   The patient arrived on time for her Caregility session  Time Spent: 59 minutes, 2 PM to 2:59 PM  Tre atment Type: Individual Therapy Reported Symptoms: anxiety, panic attacks The patient said he jokingly said to his wife that he had not had any pain issues in the cervical area and  then a few days ago he started having pain in his neck and shoulders.  He now sits related to degenerative disk and has some consistent pain somewhere in his spine with that but jokingly says it just depends on Wednesday which part of his body is decides to  show up and hurt.  He is doing the best that he can.  He is still walking which he knows is good for him but does not have right now the strength to do as much in the morning as he has done in the past so he is denying that to 3 times a day for consistency.  He is still doing some positive things and that he is staying in touch with his brother and other family members.  He is reconnecting with friends from high school and he is which  she is enjoying.  One of them has had a similar experience in that she lost her son in the same way the patient lost his daughter.  He said they did not think and Easter in which they set a place for his daughter at the table and put her picture there.  He initially had reservations about doing that he and his wife had made everyone feel as if she was really there with him.  He still feels that he is grieving in a healthy way.  He and h is wife have always taken a trip around both of their birthdays which are 2 days apart.  That is now close to his daughter died so they have started taking some of her rashes with him and sprinkling them where they go as a way of remembrance.  They are deciding what that looks like this year in July.  He does contract for safety having no thoughts of hurting himself or anyone else. We will continue to work on coping skills for helpin g with his anxiety and the progression of his Lewy body dementia diagnosis as well as processing his grief. 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 Speech/Language:  Normal Rate Affect: Appropriate Mood: normal Thought process: normal Thought content:   WNL Sensory/Perceptual disturbances:  WNL Orientation: oriented to person, place , and situation Attention: Good Concentration: Good Memory: Impaired short term Fund of knowledge:  Good Insight:  Good Judgment:  Good Impulse Control: Good  Risk Assessment: Danger to Self: No Self-injurious Behavior: No Danger to Others: No Duty to Warn:no Physical Aggression / Violence:No  Access to Firearms a concern: No  Gang Involvement:No  Treatment plan: Will employ cognitive behavioral therapy as well as gri ef and loss therapy for relief of anxiety and grief.  Goals of grief  therapy or to have a healthier response to the loss of his daughter and less sadness as evidenced by patient report in therapy notes as well as to help him feel less overwhelmed by her loss.  Goals are to see a 50% reduction in grief symptoms over the next 6 months.  I will encourage the use of the patient telling his grief story about his daughter including encouragin g him to bring pictures and memorabilia to help process his feelings including any feelings of guilt associated with her loss.  We will use cognitive behavioral therapy to help identify and change anxiety producing thoughts and behavior patterns so as to improve his ability to better manage anxiety and stress, and manage thoughts and worrisome thinking contributing to anxiety.  We will also refresh and encourage dialectical behavior the rapy distress tolerance and mindfulness skills with the intention of reducing anxiety by 50% over the next 6 months. Progress: 30% Interventions: Cognitive Behavioral Therapy  Diagnosis:PTSD, Bi-Polar D/O  Plan: F/U/ appointments scheduled weekly.  Cecile Coder, [facility]                                                                                                                    Cecile Coder,  [facility]   [facility] Counselor/Therapist Progress Note  Patient ID: Zachary Nole, MRN: [id number],    Date: 04/12/2024 This session was held via video teletherapy. The patient consented to the video teletherapy and was located in his home during this session. He is aware it is the responsibility of the patient to secure confidentiality on his end of the session. The provider was in a private home office for the duration of this session.   The  patient arrived on time for her Caregility session  Time Spent: 58 minutes, 2 PM to 2:58 PM  Treatment Type: Individual Therapy Reported Symptoms: anxiety, panic attacks A fairly difficult week because it was his daughter's birthday.  He said this year was more  difficult than last year but he could not pinpoint why.  They did remember her about going out to a nice restaurant which is something her daughter always wanted to do.  He  said they were able to laugh and joke about funny things that his daughter had said or done which is not something they were able to do this time last year so he saw that as healthy grieving.  He knows that the time change threw things off a little bit for everybody and his wife still is not feeling well and his back has been bothering him.  He was not able to go to his first round of physical therapy this week because his back was both ering him more.  He has been thinking a lot more about his daughter which he feels comfortable with.  He is thankful that it is getting warmer and staying light longer so he can get back to some of the coping skills such as fishing that he has done in the past.   We will continue to work on coping skills for helping with his anxiety and the progression of his Lewy body dementia diagnosis as well as processing his grief. Mental Statu s Exam: Appearance:  Well Groomed  Behavior: Appropriate Motor: Pt. Reports some instability due to Lewey Body dementia Speech/Language:  Normal Rate Affect: Appropriate Mood: normal Thought process: normal Thought content:  WNL Sensory/Perceptual disturbances:  WNL Orientation: oriented to person, place, and situation Attention: Good Concentration: Good Memory: Impaired short term Fund of knowledge:  Good Insight:   Good Judgment:  Good Impulse Control: Good  Risk Assessment: Danger to Self: No Self-injurious Behavior: No Danger to Others: No Duty to Warn:no Physical Aggression / Violence:No  Access to Firearms a concern: No  Gang Involvement:No  Treatment plan: Will employ cognitive behavioral therapy as well as grief and loss therapy for relief of anxiety and grief.  Goals of grief therapy or to have a healthier response to the loss  of his  daughter and less sadness as evidenced by patient report in therapy notes as well as to help him feel less overwhelmed by her loss.  Goals are to see a 50% reduction in grief symptoms over the next 6 months.  I will encourage the use of the patient telling his grief story about his daughter including encouraging him to bring pictures and memorabilia to help process his feelings including any feelings of guilt associated with her  loss.  We will use cognitive behavioral therapy to help identify and change anxiety producing thoughts and behavior patterns so as to improve his ability to better manage anxiety and stress, and manage thoughts and worrisome thinking contributing to anxiety.  We will also refresh and encourage dialectical behavior therapy  distress tolerance and mindfulness skills with the intention of reducing anxiety by 50% over the next 6 months. Pro gress: 30% Interventions: Cognitive Behavioral Therapy  Diagnosis:PTSD, Bi-Polar D/O  Plan: F/U/ appointments scheduled weekly.  Cecile Coder, [facility]                                                                                                                   Cecile Coder, [facility]               Cr aig Kelly Patient, [facility]               Cecile Coder, [facility]  [facility] Counselor/Therapist Progress Note  P atient ID: Dashel Goines, MRN: [id number],    Date: 04/12/2024 This session was held via video teletherapy. The patient consented to the video teletherapy and was located in his home during this session. He is aware it is the responsibility of the patient to secure confidentiality on his end of the session. The provider was in a private home office for the duration of this session.   The patient arrived on time for her Caregilit y session  Time Spent: 1:05 PM until 2 PM, 55 minutes Treatment Type: Individual Therapy Reported Symptoms: anxiety, panic attacks The patient said he came in from his walk this morning and saw what he thought was some sort of tic from his wife.  It was not the first time it happened and he wonders if it might be tardive dyskinesia because of some new medications that she is on.  She has a call into the doctor right now and hopes t o hear something back soon.  They have narrowed down to the fact they think most of her discomfort in breathing is coming from the diaphragm and they are looking for alternatives for  treatment.  The patient continues to do those things which help him cope including walking, fishing when he can, spending time with his son and daughter-in-law and grandson.  He also has a way of continued coping with grief he is writing letters to his da ughters as a way of expressing his feelings and says that is very beneficial.  He appears to be grieving in a healthy way but he knows that he to year anniversary of her death is coming up and there is some grief there but says it is not overwhelming.  We will continue to work on coping skills for helping with his anxiety and the progression of his Lewy body dementia diagnosis as well as processing his grief. Mental Status Exam: App earance:  Well Groomed  Behavior: Appropriate Motor: Pt. Reports some instability due to Lewey Body dementia Speech/Language:  Normal Rate Affect: Appropriate Mood: normal Thought process: normal Thought content:  WNL Sensory/Perceptual disturbances:  WNL Orientation: oriented to person, place, and situation Attention: Good Concentration: Good Memory: Impaired short term Fund of knowledge:  Good Insight:  Good Judgme nt:  Good Impulse Control: Good  Risk Assessment: Danger to Self: No Self-injurious Behavior: No Danger to Others: No Duty to Warn:no Physical Aggression / Violence:No  Access to Firearms a concern: No  Gang Involvement:No  Treatment plan: Will employ cognitive behavioral therapy as well as grief and loss therapy for relief of anxiety and grief.  Goals of grief therapy or to have a healthier response to the loss of his daugh ter and less sadness as evidenced by patient report in therapy notes as well as to help him feel less overwhelmed by her loss.  Goals are to see a 50% reduction in grief symptoms over the next 6 months.  I will encourage the use of the patient telling his grief story about his daughter including encouraging him to bring pictures and memorabilia to help process his  feelings including any feelings of guilt associated with her loss.  We wi ll use cognitive behavioral therapy to help identify and change anxiety producing thoughts and behavior patterns so as to improve his ability to better manage anxiety and stress, and manage thoughts and worrisome thinking contributing to anxiety.  We will also refresh and encourage dialectical behavior therapy distress tolerance  and mindfulness skills with the intention of reducing anxiety by 50% over the next 6 months. Progress: 30%  Interventions: Cognitive Behavioral Therapy  Diagnosis:PTSD, Bi-Polar D/O  Plan: F/U/ appointments scheduled weekly.  Cecile Coder, [facility]                                                                                                                   Cecile Coder, [facility]  [facility] Counselor/Therapist Progress Note  Patient ID: Marek Nghiem, MRN: [id number],    Date: 04/12/2024 This session was held vi a video teletherapy. The patient consented to the video teletherapy and was located in his home during this session. He is aware it is the responsibility of the patient to secure confidentiality on his end of the session. The provider was in a private home office for the duration of this session.   The patient arrived on time for her Caregility session  Time Spent: 59 minutes, 2 PM to 2:59 PM  Treatment Type: Individual Therapy R eported Symptoms: anxiety, panic attacks The patient continues to present with physical pain and both his back and his knee.  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  He has continued to walk because he knows that is good for him in various way s but says it has been painful.  His wife also was not feeling well but she is going back to the doctor next week to have an ablation and hopes that will bring her some relief.  For the most part he has been at the last couple weeks reaching back out and hearing from some old friends as well as time with his son and son's family.  He says that his wife's cousin is coming down for a weekend soon and they enjoy time with her and that he w ill go to his brother's house sometime in the next few weeks.  He recognizes the need for people that he cares about in helping with his depression. We will continue to work on coping  skills for helping with his anxiety and the progression of his Lewy body dementia diagnosis as well as processing his grief. Mental Status Exam: Appearance:  Well Groomed  Behavior: Appropriate Motor: Pt. Reports some instability due to Lewey Body d ementia Speech/Language:  Normal Rate Affect: Appropriate Mood: normal Thought process: normal Thought content:  WNL Sensory/Perceptual disturbances:  WNL Orientation: oriented to person, place, and situation Attention: Good Concentration: Good Memory: Impaired short term Fund of knowledge:  Good Insight:  Good Judgment:  Good Impulse Control: Good  Risk Assessment: Danger to Self: No Self-injurious Behavior: No D anger to Others: No Duty to Warn:no Physical Aggression / Violence:No  Access to Firearms a concern: No  Gang Involvement:No  Treatment plan: Will employ cognitive behavioral therapy as well as grief and loss therapy for relief of anxiety and grief.  Goals of grief therapy or to have a healthier response to the loss of his daughter and less sadness as evidenced by patient report in therapy notes as well as to help him feel less ove rwhelmed by her loss.  Goals are to see a 50% reduction in grief symptoms over the next 6 months.  I will encourage the use of the patient telling his grief story about his daughter including encouraging him to bring pictures and memorabilia to help process his feelings including any feelings of guilt associated with her loss.  We will use cognitive behavioral therapy to help identify and change anxiety producing thoughts and behavior p atterns so as to improve his ability to better manage anxiety and stress, and manage thoughts and worrisome thinking contributing to anxiety.  We will also  refresh and encourage dialectical behavior therapy distress tolerance and mindfulness skills with the intention of reducing anxiety by 50% over the next 6 months. Progress: 30% Interventions: Cognitive Behavioral  Therapy  Diagnosis:PTSD, Bi-Polar D/O  Plan: F/U/ appointments [place] heduled weekly.  Cecile Coder, [facility]                                                                                                                   Cecile Coder, [facility]               Cecile Coder, [facility]               Cecile Coder, [facility]               Cecile Coder, [facility]  [facility] Counselor/Therapist Progress Note  Patient ID: Limmie Schoenberg, MRN: [id number],    Dat e: 04/12/2024 This session was held via video teletherapy. The patient consented to the video teletherapy and was  located in his home during this session. He is aware it is the responsibility of the patient to secure confidentiality on his end of the session. The provider was in a private home office for the duration of this session.   The patient arrived on time for her Caregility session  Time Spent: 57 minutes, 2 PM to 2:57 PM   Treatment Type: Individual Therapy Reported Symptoms: anxiety, panic attacks The patient is still having significant back pain.  He cannot sit anywhere for very long and has to be careful where he sits.  There is still some unsteadiness although he did not report any falls over the past 2 weeks.  His biggest concern now is for his wife.  She has been having some difficulty breathing and they did a test so they found a small hole on t he back side of her heart.  She is meeting with her cardiologist again next week to see what possible treatment is for her.  He still thinks there may be some issue with her diaphragm but she is meeting with the pulmonologist also.  He is doing what he can to help her.  His daughter's birthday is March 11 so he knows that is a difficult time for both he and his wife next week.  Typically they do something to remember her but because o f the wife's current health issues they cannot go to [place] as they had planned so they are talking about what to do to remember and celebrate her birthday.  We will continue to work on coping skills for helping with his anxiety and the progression of his Lewy body dementia diagnosis as well as processing his grief. Mental Status Exam: Appearance:  Well Groomed  Behavior: Appropriate Motor: Pt. Reports some instability du e to [employer] dementia Speech/Language:  Normal Rate Affect: Appropriate Mood: normal Thought process: normal Thought content:  WNL Sensory/Perceptual disturbances:  WNL Orientation: oriented to person, place, and situation Attention: Good Concentration: Good Memory: Impaired short  term Fund of knowledge:  Good Insight:  Good Judgment:  Good Impulse Control: Good  Risk Assessment: Danger to Self: No Self-injuriou s Behavior: No Danger to Others: No Duty to Warn:no Physical Aggression / Violence:No  Access to Firearms a concern: No  Gang Involvement:No  Treatment plan: Will employ cognitive behavioral therapy as well as grief and loss therapy for relief of anxiety and grief.  Goals of grief therapy or to have a healthier response to the loss of his daughter and less sadness as evidenced by patient report in therapy notes as well as to help  him feel less overwhelmed by her loss.  Goals are to see a 50% reduction in grief symptoms over the next 6 months.  I will encourage the use of the patient telling his grief story about his daughter including encouraging him to bring pictures and memorabilia to help process his feelings including any feelings of guilt associated with her loss.  We will use cognitive behavioral therapy to help identify and change anxiety producing though ts and behavior patterns so as to improve his ability to better manage anxiety and stress, and manage thoughts and worrisome thinking contributing to anxiety.  We will also refresh and encourage dialectical behavior therapy distress tolerance and mindfulness  skills with the intention of reducing anxiety by 50% over the next 6 months. Progress: 30% Interventions: Cognitive Behavioral Therapy  Diagnosis:PTSD, Bi-Polar D/O  Plan: F/U / appointments scheduled weekly.  Cecile Coder, [facility]                                                                                                                   Cecile Coder, [facility]  [facility] Counselor/Therapist Progress Note  Patient ID: Daryel Kenneth, MRN: [id number],    Date: 04/12/2024 This session was held via video teletherapy. The patient consented to the video teletherapy and was located in h is home during this session. He is aware it is the responsibility of the patient to secure confidentiality on his end of the session. The provider was in a private home office for the duration of this session.   The patient arrived on time for her Caregility session  Time Spent: 58 minutes, 2 PM to 2:58 PM  Treatment Type: Individual Therapy Reported Symptoms: anxiety, panic attacks A fairly difficult week because it was his dau ghter's birthday.  He said this year was more difficult than last year but he could not pinpoint why.  They did remember her about going out to a nice restaurant which is something her  daughter always wanted to do.  He said they were able to laugh and joke about funny things that his daughter had said or done which is not something they were able to do this time last year so he saw that as healthy grieving.  He knows that the time chang e threw things off a little bit for everybody and his wife still is not feeling well and his back has been bothering him.  He was not able to go to his first round of physical therapy this week because his back was bothering him more.  He has been thinking a lot more about his daughter which he feels comfortable with.  He is thankful that it is getting warmer and staying light longer so he can get back to some of the coping skills such  as fishing that he has done in the past.   We will continue to work on coping skills for helping with his anxiety and the progression of his Lewy body dementia diagnosis as well as processing his grief. 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 Speech/Language:  Normal Rate Affect: Appropriate Mood: normal Thought process: normal Thou ght content:  WNL Sensory/Perceptual disturbances:  WNL Orientation: oriented to person, place, and situation Attention: Good Concentration: Good Memory: Impaired short term Fund of knowledge:  Good Insight:  Good Judgment:  Good Impulse Control: Good  Risk Assessment: Danger to Self: No Self-injurious Behavior: No Danger to Others: No Duty to Warn:no Physical Aggression / Violence:No  Access to Firearms a concern:  No  Gang Involvement:No  Treatment plan: Will employ cognitive behavioral therapy as well as grief and loss therapy for relief of anxiety and grief.  Goals of grief therapy or to have a healthier response to the loss of his daughter and less sadness as evidenced by patient report in therapy notes as well as to help him feel less overwhelmed by her loss.  Goals are  to see a 50% reduction in grief symptoms over the next 6 months.  I wil l encourage the use of the patient telling his grief story about his daughter including encouraging him to bring pictures and memorabilia to help process his feelings including any feelings of guilt associated with her loss.  We will use cognitive behavioral therapy to help identify and change anxiety producing thoughts and behavior patterns so as to improve his ability to better manage anxiety and stress, and manage thoughts and worris ome thinking contributing to anxiety.  We will also refresh and encourage dialectical behavior therapy  distress tolerance and mindfulness skills with the intention of reducing anxiety by 50% over the next 6 months. Progress: 30% Interventions: Cognitive Behavioral Therapy  Diagnosis:PTSD, Bi-Polar D/O  Plan: F/U/ appointments scheduled weekly.  Cecile Coder, [facility]                                                                                                                    Cecile Coder, [facility]               Cecile Coder, [facility]               Cecile Coder, [facility]  [place] [facility] Counselor/Therapist Progress Note  Patient ID: Standley Bargo, MRN: [id number],    Date: 04/12/2024 This session was held via video teletherapy. The patient consented to the video teletherapy and was located in his home during this session.  He is aware it is the responsibility of the patient to secure confidentiality on his end of the session. The provider was in a private home office for the duration of this session.   The patient arrived on time for her Caregility session  Time Spent: 57 minutes, 2 PM to 2:57 PM  Treatment Type: Individual Therapy Reported Symptoms: anxiety, panic attacks The patient is still having significant back pain.  He cannot sit anywhere  for very long and has to be careful where he sits.  There is still some unsteadiness although he did not report any falls over the past 2 weeks.  His biggest concern now is for his wife.  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  She is meeting with her cardiologist again next week to see what possible treatment is for her.  He still thinks there may be s ome issue with her diaphragm but she is meeting with the pulmonologist also.  He is doing what he can to help her.  His daughter's birthday is March 11 so he knows that is a difficult  time for both he and his wife next week.  Typically they do something to remember her but because of the wife's current health issues they cannot go to [place] as they had planned so they are talking about what to do to remember and celebrate her b irthday.  We will continue to work on coping skills for helping with his anxiety and the progression of his Lewy body dementia diagnosis as well as processing his grief. 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 Speech/Language:  Normal Rate Affect: Appropriate Mood: normal Thought process: normal Thought content:  WNL Sensory/Percep tual disturbances:  WNL Orientation: oriented to person, place, and situation Attention: Good Concentration: Good Memory: Impaired short term Fund of knowledge:  Good Insight:  Good Judgment:  Good Impulse Control: Good  Risk Assessment: Danger to Self: No Self-injurious Behavior: No Danger to Others: No Duty to Warn:no Physical Aggression / Violence:No  Access to Firearms a concern: No  Gang Involvement:No  Treatme nt plan: Will employ cognitive behavioral therapy as well as grief and loss therapy for relief of anxiety and grief.  Goals of grief therapy or to have a healthier response to the loss of his daughter and less sadness as evidenced by patient report in therapy notes as well as to help him feel less overwhelmed by her loss.  Goals are to see a 50% reduction in grief symptoms over the next 6 months.  I will encourage the use of the patient  telling his grief story about his daughter including encouraging him to bring pictures and memorabilia to help process his feelings including any feelings of guilt associated with her loss.  We will use cognitive behavioral therapy to help identify and change anxiety producing thoughts and behavior patterns so as to improve his ability to better manage anxiety and  stress, and manage thoughts and worrisome thinking contributing to anxie ty.  We will also refresh and encourage dialectical behavior therapy distress tolerance and mindfulness  skills with the intention of reducing anxiety by 50% over the next 6 months. Progress: 30% Interventions: Cognitive Behavioral Therapy  Diagnosis:PTSD, Bi-Polar D/O  Plan: F/U/ appointments scheduled weekly.  Cecile Coder, [facility]                                                                                                                    Cecile Coder, [facility]  [place] [facility] Counselor/Therapist Progress Note  Patient  ID: Tee Richeson, MRN: [id number],    Date: 04/12/2024 This session was held via video teletherapy. The patient consented to the video teletherapy and was located in his home during this session. He is aware it is the responsibility of the patient to secure confidentiality on his end of the session. The provider was in a  private home office for the duration of this session.   The patient arrived on time for her Caregility session  Time Spent: 57 minutes, 2 PM to 2:57 PM  Treatment Type: Individual Therapy Reported Symptoms: anxiety, panic attacks  We will continue to work on coping skills for helping with his anxiety and the progression of his Lewy body dementia diagnosis as well as processing his grief. Mental Status Exam: Appearance:  Well  Groomed  Behavior: Appropriate Motor: Pt. Reports some instability due to Lewey Body dementia Speech/Language:  Normal Rate Affect: Appropriate Mood: normal Thought process: normal Thought content:  WNL Sensory/Perceptual disturbances:  WNL Orientation: oriented to person, place, and situation Attention: Good Concentration: Good Memory: Impaired short term Fund of knowledge:  Good Insight:  Good Judgment:  Good Imp ulse Control: Good  Risk Assessment: Danger to Self: No Self-injurious Behavior: No Danger to Others: No Duty to Warn:no Physical Aggression / Violence:No  Access to Firearms a concern: No  Gang Involvement:No  Treatment plan: Will employ cognitive behavioral therapy as well as grief and loss therapy for relief of anxiety and grief.  Goals of grief therapy or to have a healthier response to the loss of his daughter and less s adness as evidenced by patient report in therapy notes as well as to help him feel less overwhelmed by her loss.  Goals are to see a 50% reduction in grief symptoms over the next 6 months.  I will encourage the use of the patient telling his grief story about his daughter including encouraging  him to bring pictures and memorabilia to help process his feelings including any feelings of guilt associated with her loss.  We will use cogniti ve behavioral therapy to help identify and change anxiety producing thoughts and behavior patterns so as to improve his ability to better manage anxiety and stress, and manage thoughts and worrisome thinking contributing to anxiety.  We will also refresh and encourage dialectical behavior therapy distress tolerance and mindfulness skills with the intention of reducing anxiety by 50% over the next 6 months. Progress: 30% Interventions:  Cognitive Behavioral Therapy  Diagnosis:PTSD, Bi-Polar D/O  Plan: F/U/ appointments scheduled weekly.  Cecile Coder, [facility]                                                                                                                   Cecile Coder, [facility]               Cecile Coder, [facility]                Cecile Coder, [facility]  Cecile Coder, [facility]               Cecile Coder, [facility]               Cecile Coder, [facility]               Cecile Coder, [facility]  [place] [facility] Counselor/Therapist Progress Note  Patient ID: Fionn Stracke, MRN: [id number],    Date: 04/12/2024 This session was held via video teletherapy. The patient consented to the video teletherapy and was located in his home during this session. He is aware it is the responsibility of t he patient to secure confidentiality on his end of the session. The provider was in a private home office for the duration of this session.   The patient arrived on time for her Caregility session  Time Spent: 39 minutes, 2 PM to 2:39 PM  Treatment Type: Individual Therapy Reported Symptoms: anxiety, panic attacks The patient was not feeling well today.  He started feeling really earlier in the week having a fever bad headaches  body aches.  He felt a little better yesterday but feels a little bit of a resurgence in headaches and body aches and is running a low-grade fever.  His wife is feeling some of the same symptoms.  For the most part he says he is doing well.  They did increase his memory medication because they were starting to see some increasing decline in short-term memory and he said until recently his long-term memory had been good but he was starti ng to see a difference in how he used to markers to remember what happened at certain times historically.  He feels the medication has helped and has brightened his mood a little also.   He is still trying to walk every day and fish when he can and has enjoyed watching the [employer] as a good distraction.  He reports that he feels he is managing mood fairly well.  He has an appointment in 2 weeks.  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 We will continue to work on coping skills for helping with his anxiety and the progression of his Lewy body dementia diagnosis as well as processing his grief. 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 Speech/Language:  Normal Rate Affect: Appropriate Mood: normal Thought process : normal Thought content:  WNL Sensory/Perceptual disturbances:  WNL Orientation: oriented to person, place, and situation Attention: Good Concentration: Good Memory: Impaired short term Fund of knowledge:  Good Insight:  Good Judgment:  Good Impulse Control: Good  Risk Assessment: Danger to Self: No Self-injurious Behavior: No Danger to Others: No Duty to Warn:no Physical Aggression / Violence:No  Access to [employer] ms a concern: No  Gang Involvement:No  Treatment plan: Will employ cognitive behavioral therapy as well as grief and loss therapy for relief of anxiety and grief.  Goals of grief therapy or to have a healthier response to the loss of his daughter and less sadness as evidenced by patient report in therapy notes as well as to help him feel less overwhelmed by her loss.  Goals are to see a 50% reduction in grief symptoms over the next 6  months.  I will encourage the use of the patient telling his grief story about his daughter including encouraging him to bring pictures and memorabilia to help process his feelings including any feelings of guilt associated with her loss.  We will use cognitive behavioral therapy to help identify and change anxiety producing thoughts and behavior patterns so as to  improve his ability to better manage anxiety and stress, and manage thoug hts and worrisome thinking contributing to anxiety.  We will also refresh  and encourage dialectical behavior therapy distress tolerance and mindfulness skills with the intention of reducing anxiety by 50% over the next 6 months. Progress: 30% Interventions: Cognitive Behavioral Therapy  Diagnosis:PTSD, Bi-Polar D/O  Plan: F/U/ appointments scheduled weekly.  Cecile Coder, [facility]                                                                                                                    Cecile Coder, [facility]  [place] [facility] Counselor/Therapist Progress Note  Patient ID: Evens Meno, MRN: [id number],    Date: 04/12/2024 This session was held via video teletherapy. The patient consented to the video teletherapy and was located in his home during this session. He is aware it is the responsibility of the patient to secure confidentiality  on his end of the session. The provider was in a private home office for the duration of this session.   The patient arrived on time for her Caregility session  Time Spent: 58 minutes, 2 PM to 2:58 PM  Treatment Type: Individual Therapy Reported Symptoms: anxiety, panic attacks A fairly difficult week because it was his daughter's birthday.  He said this year was more difficult than last year but he could not pinpoint why.  They  did remember her about going out to a nice restaurant which is something her daughter always wanted to do.  He said they were able to laugh and joke about funny things that his daughter had said or done which is not something they were able to do this time last year so he saw that as healthy grieving.  He knows that the time change threw things off a little bit for everybody and his wife still is not feeling well and his back has been  bothering him.  He was not able to go to his first round of physical therapy this week because his back was bothering him more.  He has been thinking a lot more about his daughter which he feels comfortable with.  He is thankful that it is getting warmer and staying light longer so he can get back to some of the coping skills such as fishing that he has done in the past.   We will continue to work on coping skills for helping with hi s anxiety and the progression of his Lewy body dementia diagnosis as well as processing his grief. 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 Speech/Language:  Normal  Rate Affect: Appropriate Mood: normal Thought process: normal Thought content:  WNL Sensory/Perceptual disturbances:  WNL Orientation: oriented to person, place, and sit uation Attention: Good Concentration: Good Memory: Impaired short term Fund of knowledge:  Good Insight:  Good Judgment:  Good Impulse Control: Good  Risk Assessment: Danger to Self: No Self-injurious Behavior: No Danger to Others: No Duty to Warn:no Physical Aggression / Violence:No  Access to Firearms a concern: No  Gang Involvement:No  Treatment plan: Will employ cognitive behavioral therapy as well as grief and lo ss therapy for relief of anxiety and grief.  Goals of grief therapy or to have a healthier response to the loss of his daughter and less sadness as evidenced by patient report in therapy notes as well as to help him feel less overwhelmed by her loss.  Goals are to see a 50% reduction in grief symptoms over the next 6 months.  I will encourage the use of the patient telling his grief story about his daughter including encouraging him to  bring pictures and memorabilia to help process his feelings including any feelings of guilt associated with her loss.  We will use cognitive behavioral therapy to help identify and change anxiety producing thoughts and behavior patterns so as to improve his ability to better manage anxiety and stress, and manage thoughts and worrisome thinking contributing to anxiety.  We will also refresh and encourage dialectical behavior therapy dist  ress tolerance and mindfulness skills with the intention of reducing anxiety by 50% over the next 6 months. Progress: 30% Interventions: Cognitive Behavioral Therapy  Diagnosis:PTSD, Bi-Polar D/O  Plan: F/U/ appointments scheduled weekly.  Cecile Coder, [facility]                                                                                                                     Cecile Coder, [facility]               Cecile Coder, [facility]               Cecile Coder, [facility]  [place] [facility] Counselor/Therapist Progress Note  Patient ID: Arnold Kester, MRN: [id number],    Date: 04/12/2024 This session was held via video teletherapy. The patient consented to the video teletherapy and was located in his home during this session. He is aware it is the responsibility of the patient to secure confidentiality on his end of the session. The provider w as in a private home office for the duration of this session.   The patient arrived on time for her Caregility session  Time Spent: 1:05 PM until 2 PM, 55 minutes Treatment Type:  Individual Therapy Reported Symptoms: anxiety, panic attacks The patient said he came in from his walk this morning and saw what he thought was some sort of tic from his wife.  It was not the first time it happened and he wonders if it might be tardive  dyskinesia because of some new medications that she is on.  She has a call into the doctor right now and hopes to hear something back soon.  They have narrowed down to the fact they think most of her discomfort in breathing is coming from the diaphragm and they are looking for alternatives for treatment.  The patient continues to do those things which help him cope including walking, fishing when he can, spending time with his son and  daughter-in-law and grandson.  He also has a way of continued coping with grief he is writing letters to his daughters as a way of expressing his feelings and says that is very beneficial.  He appears to be grieving in a healthy way but he knows that he to year anniversary of her death is coming up and there is some grief there but says it is not overwhelming.  We will continue to work on coping skills for helping with his anxiety an d the progression of his Lewy body dementia diagnosis as well as processing his grief. 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 Speech/Language:  Normal Rate Affect: Appropriate Mood: normal Thought process: normal Thought content:  WNL Sensory/Perceptual disturbances:  WNL Orientation: oriented to person, place, and situation Atte ntion: Good Concentration: Good Memory: Impaired short term Fund of knowledge:  Good Insight:  Good Judgment:  Good Impulse Control: Good  Risk Assessment: Danger to Self: No Self-injurious Behavior: No Danger to Others: No Duty to Warn:no Physical Aggression / Violence:No  Access to Firearms a concern: No  Gang Involvement:No  Treatment plan: Will  employ cognitive behavioral therapy as well as grief and loss therapy f or relief of anxiety and grief.  Goals of grief therapy or to have a healthier response to the loss of his daughter and less sadness as evidenced by patient report in therapy notes as well as to help him feel less overwhelmed by her loss.  Goals are to see a 50% reduction in grief symptoms over the next 6 months.  I will encourage the use of the patient telling his grief story about his daughter including encouraging him to bring pictur es and memorabilia to help process his feelings including any feelings of guilt associated with her loss.  We will use cognitive behavioral therapy to help identify and change anxiety producing thoughts and behavior patterns so as to improve his ability to better manage anxiety and stress, and manage thoughts and worrisome thinking contributing to anxiety.  We will also refresh and encourage dialectical behavior therapy distress toleran ce  and mindfulness skills with the intention of reducing anxiety by 50% over the next 6 months. Progress: 30% Interventions: Cognitive Behavioral Therapy  Diagnosis:PTSD, Bi-Polar D/O  Plan: F/U/ appointments scheduled weekly.  Cecile Coder, [facility]                                                                                                                    Cecile Coder, [facility]  [place] [facility] Counselor/Therapis t Progress Note  Patient ID: Dimas Scheck, MRN: [id number],    Date: 04/12/2024 This session was held via video teletherapy. The patient consented to the video teletherapy and was located in his home during this session. He is aware it is the responsibility of the patient to secure confidentiality on his end of the session. The provider was in a private home office for the duration of this session.   The patient arrived on ti me for her Caregility session  Time Spent: 59 minutes, 2 PM to 2:59 PM  Treatment Type: Individual Therapy Reported Symptoms: anxiety, panic attacks The patient continues to present with physical pain and both his back and his knee.  He started with a new physical therapist and told him that there was a lot of arthritis in both knees and or certain things he cannot do but they had him do that anyway and now for the past 3 days he h as been in significant knee pain.  He has continued to walk because he knows that is good for him in various ways but says it has been painful.  His wife also was not feeling well but  she is going back to the doctor next week to have an ablation and hopes that will bring her some relief.  For the most part he has been at the last couple weeks reaching back out and hearing from some old friends as well as time with his son and son's fami ly.  He says that his wife's cousin is coming down for a weekend soon and they enjoy time with her and that he will go to his brother's house sometime in the next few weeks.  He recognizes the need for people that he cares about in helping with his depression. We will continue to work on coping skills for helping with his anxiety and the progression of his Lewy body dementia diagnosis as well as processing his grief. 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 Speech/Language:  Normal Rate Affect: Appropriate Mood: normal Thought process: normal Thought content:  WNL Sensory/Perceptual disturbances:  WNL Orientation: oriented to person, place, and situation Attention: Good Concentration: Good Memory: Impaired short term Fund of knowledge:  Good Insight:  Good  Judgment:  Good Impulse Control: Good  Risk Assessment: Danger to Self: No Self-injurious Behavior: No Danger to Others: No Duty to Warn:no Physical Aggression / Violence:No  Access to Firearms a concern: No  Gang Involvement:No  Treatment plan: Will employ cognitive behavioral therapy as well as grief and loss therapy for relief of anxiety and grief.  Goals of grief therapy or to have a healthier response to the loss of hi s daughter and less sadness as evidenced by patient report in therapy notes as well as to help him feel less overwhelmed by her loss.  Goals are to see a 50% reduction in grief symptoms over the next 6 months.  I will encourage the use of the patient telling his grief story about his daughter including encouraging him to bring pictures and memorabilia to help process  his feelings including any feelings of guilt associated with her loss.   We will use cognitive behavioral therapy to help identify and change anxiety producing thoughts and behavior patterns so as to improve his ability to better manage anxiety and stress, and manage thoughts and worrisome thinking contributing to anxiety.  We will  also refresh and encourage dialectical behavior therapy distress tolerance and mindfulness skills with the intention of reducing anxiety by 50% over the next 6 months. Progress : 30% Interventions: Cognitive Behavioral Therapy  Diagnosis:PTSD, Bi-Polar D/O  Plan: F/U/ appointments scheduled weekly.  Cecile Coder, [facility]                                                                                                                   Cecile Coder, [facility]               Cecile Coder, [facility]               Cecile Coder, [facility]               Cecile Coder, [facility]  [facility] Counselor/Therapist Progress Note  Patient ID: Arjay Jaskiewicz, MRN: [id number],    Date: 04/12/2024 This session was held via video teletherapy. The patient consented to the video teletherapy and was located in his home during this session. He is aware it is the responsibility of the patient to secure confidentiality on his end of the session. The provider was in a private home office for the duration of this s ession.   The patient arrived on time for her Caregility session  Time Spent: 57 minutes, 2 PM to 2:57 PM  Treatment Type: Individual Therapy Reported Symptoms: anxiety, panic attacks The patient is still having significant back pain.  He cannot sit anywhere for very long and has to be careful where he sits.  There is still some unsteadiness although he did not report any falls over the past 2 weeks.  His biggest concern now is  for his wife.  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  She is meeting with her cardiologist again next week to see what possible treatment is for her.  He still thinks there may be some issue with her diaphragm but she is meeting with the pulmonologist also.  He is doing what he can to help her.  His daughter's birthday is March 11 so he knows that is  a difficult time for both he and his wife next week.  Typically they do something to remember her but because of the wife's current health issues they cannot go to [place] as  they had planned so they are talking about what to do to remember and celebrate her birthday.  We will continue to work on coping skills for helping with his anxiety and the progression of his Lewy body dementia diagnosis as well as processing his grief.  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 Speech/Language:  Normal Rate Affect: Appropriate Mood: normal Thought process: normal Thought content:  WNL Sensory/Perceptual disturbances:  WNL Orientation: oriented to person, place, and situation Attention: Good Concentration: Good Memory: Impaired short term Fund of knowledge:  Good  Insight:  Good Judgment:  Good Impulse Control: Good  Risk Assessment: Danger to Self: No Self-injurious Behavior: No Danger to Others: No Duty to Warn:no Physical Aggression / Violence:No  Access to Firearms a concern: No  Gang Involvement:No  Treatment plan: Will employ cognitive behavioral therapy as well as grief and loss therapy for relief of anxiety and grief.  Goals of grief therapy or to have a healthier response  to the loss of his daughter and less sadness as evidenced by patient report in therapy notes as well as to help him feel less overwhelmed by her loss.  Goals are to see a 50% reduction in grief symptoms over the next 6 months.  I will encourage the use of the patient telling his grief story about his daughter including encouraging him to bring pictures and memorabilia to help process his feelings including any feelings of guilt associat ed with her loss.  We will use cognitive behavioral therapy to help identify and change anxiety producing thoughts and behavior patterns so as to improve his ability to better manage anxiety and stress, and manage thoughts and worrisome thinking contributing to anxiety.  We will also refresh and encourage dialectical behavior therapy distress tolerance and  mindfulness  skills with the intention of reducing anxiety by 50% over the next 6  months. Progress: 30% Interventions: Cognitive Behavioral Therapy  Diagnosis:PTSD, Bi-Polar D/O  Plan: F/U/ appointments scheduled weekly.  Cecile Coder, [facility]                                                                                                                   Cecile Coder, [facility]  [facility] Counselor/Therapist Progress Note  Patient ID: Jarrod Mcenery, MRN: [id number],    Date: 04/12/2024  This session was held via video teletherapy. The patient consented to the video teletherapy and was  located in his home during this session. He is aware it is the responsibility of the patient to secure confidentiality on his end of the session. The provider was in a private home office for the duration of this session.   The patient arrived on time for her Caregility session  Time Spent: 58 minutes, 2 PM to 2:58 PM  Treatment Typ e: Individual Therapy Reported Symptoms: anxiety, panic attacks A fairly difficult week because it was his daughter's birthday.  He said this year was more difficult than last year but he could not pinpoint why.  They did remember her about going out to a nice restaurant which is something her daughter always wanted to do.  He said they were able to laugh and joke about funny things that his daughter had said or done which is not some thing they were able to do this time last year so he saw that as healthy grieving.  He knows that the time change threw things off a little bit for everybody and his wife still is not feeling well and his back has been bothering him.  He was not able to go to his first round of physical therapy this week because his back was bothering him more.  He has been thinking a lot more about his daughter which he feels comfortable with.  He is t hankful that it is getting warmer and staying light longer so he can get back to some of the coping skills such as fishing that he has done in the past.   We will continue to work on coping skills for helping with his anxiety and the progression of his Lewy body dementia diagnosis as well as processing his grief. Mental Status Exam: Appearance:  Well Groomed  Behavior: Appropriate Motor: Pt. Reports some instability due to Lewe y Body dementia Speech/Language:  Normal Rate Affect: Appropriate Mood: normal Thought process: normal Thought content:  WNL Sensory/Perceptual disturbances:  WNL Orientation: oriented to person, place, and situation Attention: Good Concentration: Good Memory: Impaired  short term Fund of knowledge:  Good Insight:  Good Judgment:  Good Impulse Control: Good  Risk Assessment: Danger to Self: No Self-injurious Behavio r: No Danger to Others: No Duty to Warn:no Physical Aggression / Violence:No  Access to Firearms a concern: No  Gang Involvement:No  Treatment plan: Will employ cognitive behavioral therapy as well as grief and loss therapy for relief of anxiety and grief.  Goals of grief therapy or to have a healthier response to the loss of his daughter and less sadness as evidenced by patient report in therapy notes as well as to help him feel  less overwhelmed by her loss.  Goals are to see a 50% reduction in grief symptoms over the next 6 months.  I will encourage the use of the patient telling his grief story about his daughter including encouraging him to bring pictures and memorabilia to help process his feelings including any feelings of guilt associated with her loss.  We will use cognitive behavioral therapy to help identify and change anxiety producing thoughts and be havior patterns so as to improve his ability to better manage anxiety and stress, and manage thoughts and worrisome thinking contributing to anxiety.  We will also refresh and encourage dialectical behavior therapy  distress tolerance and mindfulness skills with the intention of reducing anxiety by 50% over the next 6 months. Progress: 30% Interventions: Cognitive Behavioral Therapy  Diagnosis:PTSD, Bi-Polar D/O  Plan: F/U/ appoint ments scheduled weekly.  Cecile Coder, [facility]                                                                                                                   Cecile Coder, [facility]               Cecile Coder, [facility]               Cecile Coder, [facility]  [place] [facility] Counselor/Therapist Progress Note  Patient ID: Thong Feeny, MRN: [id number],    Date: 04/12/2024 This session was held via vid eo teletherapy. The patient consented to the video teletherapy and was located in his home during this session. He is aware it is the responsibility of the patient to secure confidentiality on his end of the session. The provider was in a private home office for the duration of this session.   The patient arrived on time for her Caregility session  Time Spent: 57 minutes, 2 PM to 2:57 PM  Treatment Type: Individual Therapy Report ed Symptoms: anxiety, panic attacks The patient is still having significant back pain.  He cannot sit anywhere for very long and has to be careful where he sits.  There is still some  unsteadiness although he did not report any falls over the past 2 weeks.  His biggest concern now is for his wife.  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  She is meeting  with her cardiologist again next week to see what possible treatment is for her.  He still thinks there may be some issue with her diaphragm but she is meeting with the pulmonologist also.  He is doing what he can to help her.  His daughter's birthday is March 11 so he knows that is a difficult time for both he and his wife next week.  Typically they do something to remember her but because of the wife's current health issues they can not go to [place] as they had planned so they are talking about what to do to remember and celebrate her birthday.  We will continue to work on coping skills for helping with his anxiety and the progression of his Lewy body dementia diagnosis as well as processing his grief. 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 Speech/Language:   Normal Rate Affect: Appropriate Mood: normal Thought process: normal Thought content:  WNL Sensory/Perceptual disturbances:  WNL Orientation: oriented to person, place, and situation Attention: Good Concentration: Good Memory: Impaired short term Fund of knowledge:  Good Insight:  Good Judgment:  Good Impulse Control: Good  Risk Assessment: Danger to Self: No Self-injurious Behavior: No Danger to Others: No Duty  to Warn:no Physical Aggression / Violence:No  Access to Firearms a concern: No  Gang Involvement:No  Treatment plan: Will employ cognitive behavioral therapy as well as grief and loss therapy for relief of anxiety and grief.  Goals of grief therapy or to have a healthier response to the loss of his daughter and less sadness as evidenced by patient report in  therapy notes as well as to help him feel less overwhelmed by her loss.  [place] are to see a 50% reduction in grief symptoms over the next 6 months.  I will encourage the use of the patient telling his grief story about his daughter including encouraging him to bring pictures and memorabilia to help process his feelings including any feelings of guilt associated with her loss.  We will use cognitive behavioral therapy to help identify and change anxiety producing thoughts and behavior patterns so as to improve h is ability to better manage anxiety and stress, and manage thoughts and worrisome thinking contributing to anxiety.  We will also refresh and encourage dialectical behavior therapy distress tolerance and mindfulness  skills with the intention of reducing anxiety by 50% over the next 6 months. Progress: 30% Interventions: Cognitive Behavioral Therapy  Diagnosis:PTSD, Bi-Polar D/O  Plan: F/U/ appointments scheduled weekly.  Cecile Coder, [facility]                                                                                                                   Cecile Coder, [facility]  [place] [facility] Counselor/Therapist Progress Note  Patient ID: Karlo Goeden, MRN: [id number],    Date: 04/12/2024 This session was held via video teletherapy. The patient consented to the video teletherapy and was located in his home during this session. He is aware it  is the responsibility of the patient to secure confidentiality on his end of the session. The provider was in a private home office for the duration of this session.   The patient arrived on time for her Caregility session  Time Spent: 57 minutes, 2 PM to 2:57 PM  Treatment Type: Individual Therapy Reported Symptoms: anxiety, panic attacks  The patient reported that he had not been sleeping well so he did speak with his psychi atrist about adjusting some meds.  Initially they adjusted too much up and he said he felt groggy for the entire next day but they have found a combination now where he feels like he is getting more rested and only feels a little drowsy shortly after waking up..  There have been a couple times where he stumbled and one of the times he felt down but did not get hurt the other times he was able to catch himself.  He is trying to stay as a ctive as he can walking daily and fishing with his wife when he can.  His biggest anxiety now is his wife has an upcoming surgery on her diaphragm in 2 weeks so he knows that will be a  fairly significant recovery time but also sees the difficulties she is having now and wants her to get some relief..  His son and daughter-in-law will be supports for them also.  He continues to speak with friends and family members as encouragement.  He  also feels that he is grieving appropriately writing to his daughter on a regular basis about the things that are going on in his and his wife's lives.  We will continue to work on coping skills for helping with his anxiety and the progression of his Lewy body dementia diagnosis as well as processing his grief. Mental Status Exam: Appearance:  Well Groomed  Behavior: Appropriate Motor: Pt. Reports some instability due to Lewey B ody dementia Speech/Language:  Normal Rate Affect: Appropriate Mood: normal Thought process: normal Thought content:  WNL Sensory/Perceptual disturbances:  WNL Orientation: oriented to person, place, and situation Attention: Good Concentration: Good Memory: Impaired short term Fund of knowledge:  Good Insight:  Good Judgment:  Good Impulse Control: Good  Risk Assessment: Danger to Self: No Self-injurious Behavior:  No Danger to Others: No Duty to Warn:no Physical Aggression / Violence:No  Access to Firearms a concern: No  Gang Involvement:No  Treatment plan: Will employ cognitive behavioral therapy as well as grief and loss therapy for relief of anxiety and grief.  Goals of grief therapy or to have a healthier response to the loss of his daughter and less sadness as evidenced by patient report in therapy notes as well as to help him feel les s overwhelmed by her loss.  Goals are to see a 50% reduction in grief symptoms over the next 6 months.  I will encourage the use of the patient telling his grief story about his daughter including encouraging him to bring pictures and memorabilia to help process his feelings including any feelings of guilt associated with her loss.  We will use cognitive behavioral  therapy to help identify and change anxiety producing thoughts and behav ior patterns so as to improve his ability  to better manage anxiety and stress, and manage thoughts and worrisome thinking contributing to anxiety.  We will also refresh and encourage dialectical behavior therapy distress tolerance and mindfulness skills with the intention of reducing anxiety by 50% over the next 6 months. Progress: 30% Interventions: Cognitive Behavioral Therapy  Diagnosis:PTSD, Bi-Polar D/O  Plan: F/U/ appointmen ts scheduled weekly.  Cecile Coder, [facility]                                                                                                                   Cecile Coder, [facility]               Cecile Coder, [facility]               Cecile Coder, [facility]               [address], [facility]               Cecile Coder, [facility]               Cecile Coder, [facility]               Cecile Coder, [facility]               Cecile Coder, [facility]  [place] [facility] Counselor/Therapist Progress Note  Patient ID: Hong Moring, MRN: [id number],    Date: 04/12/2024 This session was held via video teletherapy. The patient consented to the video teletherapy and was located in his home during this session. He is aware it is the responsibility of the patient to secure confidentialit y on his end of the session. The provider was in a private home office for the duration of this session.   The patient arrived on time for her Caregility session  Time Spent: 59 minutes, 2 PM to 2:59 PM  Treatment Type: Individual Therapy Reported Symptoms: anxiety, panic attacks The patient said he jokingly said to his wife that he had not had any pain issues in the cervical area and then a few days ago he started having pain i n his neck and shoulders.  He now sits related to degenerative disk and has some consistent pain somewhere in his spine with that but jokingly says it just depends on Wednesday which part of his body is decides to show up and hurt.  He is doing the best that he can.  He is still walking which he knows is good for him but does not have right now the strength to do as much in the morning as he has done in the past so he is denying that to  3 times a day for consistency.  He is still doing some positive things and that he is staying in touch with his brother and other family members.  He is reconnecting with friends from  high school and he is which she is enjoying.  One of them has had a similar experience in that she lost her son in the same way the patient lost his daughter.  He said they did not think and Easter in which they set a place for his daughter at the table a nd put her picture there.  He initially had reservations about doing that he and his wife had made everyone feel as if she was really there with him.  He still feels that he is grieving in a healthy way.  He and his wife have always taken a trip around both of their birthdays which are 2 days apart.  That is now close to his daughter died so they have started taking some of her rashes with him and sprinkling them where they go as a way  of remembrance.  They are deciding what that looks like this year in July.  He does contract for safety having no thoughts of hurting himself or anyone else. We will continue to work on coping skills for helping with his anxiety and the progression of his Lewy body dementia diagnosis as well as processing his grief. Mental Status Exam: Appearance:  Well Groomed  Behavior: Appropriate Motor: Pt. Reports some instability due to L ewey Body dementia Speech/Language:  Normal Rate Affect: Appropriate Mood: normal Thought process: normal Thought content:  WNL Sensory/Perceptual disturbances:  WNL Orientation: oriented to person, place, and situation Attention: Good Concentration: Good Memory: Impaired short term Fund of knowledge:  Good Insight:  Good Judgment:  Good Impulse Control: Good  Risk Assessment: Danger to Self: No Self-injurious Beha vior: No Danger to Others: No Duty to Warn:no Physical Aggression / Violence:No  Access to Firearms a concern: No  Gang Involvement:No  Treatment plan: Will employ cognitive behavioral therapy as well as grief and loss therapy for relief of anxiety and grief.  Goals of grief therapy or  to have a healthier response to the loss of his daughter and less sadness as  evidenced by patient report in therapy notes as well as to help him fe el less overwhelmed by her loss.  Goals are to see a 50% reduction in grief symptoms over the next 6 months.  I will encourage the use of the patient telling his grief story about his daughter including encouraging him to bring pictures and memorabilia to help process his feelings including any feelings of guilt associated with her loss.  We will use cognitive behavioral therapy to help identify and change anxiety producing thoughts and  behavior patterns so as to improve his ability to better manage anxiety and stress, and manage thoughts and worrisome thinking contributing to anxiety.  We will also refresh and encourage dialectical behavior therapy distress tolerance and mindfulness skills with the intention of reducing anxiety by 50% over the next 6 months. Progress: 30% Interventions: Cognitive Behavioral Therapy  Diagnosis:PTSD, Bi-Polar D/O  Plan: F/U/ appo intments scheduled weekly.  Cecile Coder, [facility]                                                                                                                   Cecile Coder, [facility]  [place] [facility] Counselor/Therapist Progress Note  Patient ID: Jerone Cudmore, MRN: [id number],    Date: 04/12/2024 This session was held via video teletherapy. The patient consented to the video teletherapy and was located in his hom e during this session. He is aware it is the responsibility of the patient to secure confidentiality on his end of the session. The provider was in a private home office for the duration of this session.   The patient arrived on time for her Caregility session  Time Spent: 58 minutes, 2 PM to 2:58 PM  Treatment Type: Individual Therapy Reported Symptoms: anxiety, panic attacks A fairly difficult week because it was his daughter' s birthday.  He said this year was more difficult than last year but he could not pinpoint why.  They did remember her about going out to a nice restaurant which is something her daughter always wanted to do.  He said they were able to laugh and joke about funny things that his daughter had said or done which is not something they were able to do this time last year so he saw that as healthy grieving.  He knows that the time change thre w things off a little bit for everybody and his wife still is not feeling well and his back has been bothering him.  He was not able to go to his first round of physical therapy this  week because his back was bothering him more.  He has been thinking a lot more about his daughter which he feels comfortable with.  He is thankful that it is getting warmer and staying light longer so he can get back to some of the coping skills such as fis hing that he has done in the past.   We will continue to work on coping skills for helping with his anxiety and the progression of his Lewy body dementia diagnosis as well as processing his grief. 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 Speech/Language:  Normal Rate Affect: Appropriate Mood: normal Thought process: normal Thought co ntent:  WNL Sensory/Perceptual disturbances:  WNL Orientation: oriented to person, place, and situation Attention: Good Concentration: Good Memory: Impaired short term Fund of knowledge:  Good Insight:  Good Judgment:  Good Impulse Control: Good  Risk Assessment: Danger to Self: No Self-injurious Behavior: No Danger to Others: No Duty to Warn:no Physical Aggression / Violence:No  Access to Firearms a concern: No  G ang Involvement:No  Treatment plan: Will employ cognitive behavioral therapy as well as grief and loss therapy for relief of anxiety and grief.  Goals of grief therapy or to have a healthier response to the loss of his daughter and less sadness as evidenced by patient report in therapy notes as well as to help him feel less overwhelmed by her loss.  Goals are to see a 50% reduction in grief symptoms over the next 6 months.  I will enco urage the use of the patient telling his grief story about his daughter including encouraging him to bring pictures and memorabilia to help process his feelings including any feelings of guilt associated with her loss.  We will use cognitive behavioral therapy to help identify and change anxiety producing thoughts and behavior patterns so as to improve his ability  to better manage anxiety and stress, and manage thoughts and worrisome th inking contributing to anxiety.  We will also refresh and encourage dialectical behavior therapy  distress tolerance and mindfulness skills with the intention of reducing anxiety by 50% over the next 6 months. Progress: 30% Interventions: Cognitive Behavioral Therapy  Diagnosis:PTSD, Bi-Polar D/O  Plan: F/U/ appointments scheduled weekly.  Cecile Coder, [place] [facility]                                                                                                                    Cecile Coder, [facility]               Cecile Coder, [facility]               Cecile Coder, [facility]  [facility] [facility] Counselor/Therapist Progress Note  Patient ID: Scout Gumbs, MRN: [id number],    Date: 04/12/2024 This session was held via video teletherapy. The patient consented to the video teletherapy and was located in his home during this session. He is aware it is  the responsibility of the patient to secure confidentiality on his end of the session. The provider was in a private home office for the duration of this session.   The patient arrived on time for her Caregility session  Time Spent: 1:05 PM until 2 PM, 55 minutes Treatment Type: Individual Therapy Reported Symptoms: anxiety, panic attacks The patient said he came in from his walk this morning and saw what he thought was some sor t of tic from his wife.  It was not the first time it happened and he wonders if it might be tardive dyskinesia because of some new medications that she is on.  She has a call into the doctor right now and hopes to hear something back soon.  They have narrowed down to the fact they think most of her discomfort in breathing is coming from the diaphragm and they are looking for alternatives for treatment.  The patient continues to do th ose things which help him cope including walking, fishing when he can, spending time with his son and daughter-in-law and grandson.  He also has a way of continued coping with grief he is writing letters to his daughters as a way of expressing his feelings and says that is very beneficial.  He appears to be grieving in a healthy way but he knows that he to year anniversary of her death is coming up and there is some grief there but says  it is not overwhelming.  We will continue to work on coping skills for helping with his anxiety and the progression of his Lewy body dementia diagnosis as well as processing his  grief. 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 Speech/Language:  Normal Rate Affect: Appropriate Mood: normal Thought process: normal Thought content:  WNL  Sensory/Perceptual disturbances:  WNL Orientation: oriented to person, place, and situation Attention: Good Concentration: Good Memory: Impaired short term Fund of knowledge:  Good Insight:  Good Judgment:  Good Impulse Control: Good  Risk Assessment: Danger to Self: No Self-injurious Behavior: No Danger to Others: No Duty to Warn:no Physical Aggression / Violence:No  Access to Firearms a concern: No  Thane Finch ent:No  Treatment plan: Will employ cognitive behavioral therapy as well as grief and loss therapy for relief of anxiety and grief.  Goals of grief therapy or to have a healthier response to the loss of his daughter and less sadness as evidenced by patient report in therapy notes as well as to help him feel less overwhelmed by her loss.  Goals are to see a 50% reduction in grief symptoms over the next 6 months.  I will encourage the us  e of the patient telling his grief story about his daughter including encouraging him to bring pictures and memorabilia to help process his feelings including any feelings of guilt associated with her loss.  We will use cognitive behavioral therapy to help identify and change anxiety producing thoughts and behavior patterns so as to improve his ability to better manage anxiety and stress, and manage thoughts and worrisome thinking contr ibuting to anxiety.  We will also refresh and encourage dialectical behavior therapy distress tolerance and  mindfulness skills with the intention of reducing anxiety by 50% over the next 6 months. Progress: 30% Interventions: Cognitive Behavioral Therapy  Diagnosis:PTSD, Bi-Polar D/O  Plan: F/U/ appointments scheduled weekly.  Cecile Coder,  [facility]                                                                                                                    Cecile Coder, [facility]  [place] [facility] Counselor/Therapist Progress Note  Patient ID: Prentiss Polio, MRN: [id number],    Date: 04/12/2024 This session was held via video teletherapy. The patient consented to the video teletherapy and was located in his home during this session. He is aware it is the responsibility of the patient to secure confidentiality on his end of the session. The p rovider was in a private home office for the duration of this session.   The patient arrived  on time for her Caregility session  Time Spent: 59 minutes, 2 PM to 2:59 PM  Treatment Type: Individual Therapy Reported Symptoms: anxiety, panic attacks The patient continues to present with physical pain and both his back and his knee.  He started with a new physical therapist and told him that there was a lot of arthritis in both knee s and or certain things he cannot do but they had him do that anyway and now for the past 3 days he has been in significant knee pain.  He has continued to walk because he knows that is good for him in various ways but says it has been painful.  His wife also was not feeling well but she is going back to the doctor next week to have an ablation and hopes that will bring her some relief.  For the most part he has been at the last couple  weeks reaching back out and hearing from some old friends as well as time with his son and son's family.  He says that his wife's cousin is coming down for a weekend soon and they enjoy time with her and that he will go to his brother's house sometime in the next few weeks.  He recognizes the need for people that he cares about in helping with his depression. We will continue to work on coping skills for helping with his anxiety and th e progression of his Lewy body dementia diagnosis as well as processing his grief. 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 Speech/Language:  Normal Rate Affect: Appropriate Mood: normal Thought process: normal Thought content:  WNL Sensory/Perceptual disturbances:  WNL Orientation: oriented to person, place, and situation Attentio n: Good Concentration: Good Memory: Impaired short term Fund of knowledge:  Good Insight:  Good Judgment:  Good Impulse Control: Good  Risk Assessment: Danger to Self: No Self-injurious Behavior: No Danger to Others: No Duty to Warn:no Physical Aggression /  Violence:No  Access to Firearms a concern: No  Gang Involvement:No  Treatment plan: Will employ cognitive behavioral therapy as well as grief and loss therapy for r elief of anxiety and grief.  Goals of grief therapy or to have a healthier response to the loss of his daughter and less sadness as evidenced by patient report in therapy notes as well as to help him feel less overwhelmed by her loss.  Goals are to see a 50% reduction in grief symptoms over the next 6 months.  I will encourage the use of the patient telling his grief story about his daughter including encouraging him to bring pictures a nd memorabilia to help process his feelings including any feelings of guilt associated with her loss.  We will use cognitive behavioral therapy to help identify and change anxiety producing thoughts and behavior patterns so as to improve his ability to better manage anxiety and stress, and manage thoughts and worrisome thinking contributing to anxiety.  We will also  refresh and encourage dialectical behavior therapy distress tolerance a nd mindfulness skills with the intention of reducing anxiety by 50% over the next 6 months. Progress: 30% Interventions: Cognitive Behavioral Therapy  Diagnosis:PTSD, Bi-Polar D/O  Plan: F/U/ appointments scheduled weekly.  Cecile Coder, [facility]                                                                                                                    Cecile Coder, [facility]               Cecile Coder, [facility]               Cecile Coder, [facility]               Cecile Coder, [facility]  [place] [facility] Counselor/Therapist Progress Note  Patient ID: Tamim Skog, MRN: [id number],    Date: 04/12/2024 This session was held via video teletherapy. The patient consented to the video teletherapy and was located in his home during this session. He is aware it is the responsibility of the patient to secure confidenti ality on his end of the session. The provider was in a private home office for the duration of this session.   The patient arrived on time for her Caregility session  Time Spent: 57 minutes, 2 PM to 2:57 PM  Treatment Type: Individual Therapy Reported Symptoms: anxiety, panic attacks The patient is still having significant back pain.  He cannot sit anywhere for very long and has to be careful where he sits.  There is still some  unsteadiness although he did not report any falls over the past 2 weeks.  His biggest concern now is for his wife.  She has been having some difficulty breathing and they did a test so  they found a small hole on the back side of her heart.  She is meeting with her cardiologist again next week to see what possible treatment is for her.  He still thinks there may be some issue with her diaphragm but she is meeting with the pulmonologist a lso.  He is doing what he can to help her.  His daughter's birthday is March 11 so he knows that is a difficult time for both he and his wife next week.  Typically they do something to remember her but because of the wife's current health issues they cannot go to [place] as they had planned so they are talking about what to do to remember and celebrate her birthday.  We will continue to work on coping skills for helping with h is anxiety and the progression of his Lewy body dementia diagnosis as well as processing his grief. 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 Speech/Language:  Normal Rate Affect: Appropriate Mood: normal Thought process: normal Thought content:  WNL Sensory/Perceptual disturbances:  WNL Orientation: oriented to person, place, and si tuation Attention: Good Concentration: Good Memory: Impaired short term Fund of knowledge:  Good Insight:  Good Judgment:  Good Impulse Control: Good  Risk Assessment: Danger to Self: No Self-injurious Behavior: No Danger to Others: No Duty to Warn:no Physical Aggression / Violence:No  Access to Firearms a concern: No  Gang Involvement:No  Treatment plan: Will employ cognitive behavioral therapy as well as grief and l oss therapy for relief of anxiety and grief.  Goals of grief therapy or to have a healthier response to the loss of his daughter and less sadness as evidenced by patient report in therapy notes as well as to help him feel less overwhelmed by her loss.  Goals are to see a 50% reduction in grief symptoms over the next 6 months.  I will encourage the use of the patient  telling his grief story about his daughter including encouraging him to  bring pictures and memorabilia to help process his feelings including any feelings of guilt associated with her loss.  We will use cognitive behavioral therapy to help identify and change anxiety producing thoughts and behavior patterns so as to improve his ability to better manage anxiety and stress, and manage thoughts and worrisome thinking contributing to anxiety.  We will also refresh and encourage dialectical behavior therapy dis tress tolerance and mindfulness  skills with the intention of reducing anxiety by 50% over the next 6 months. Progress: 30% Interventions: Cognitive Behavioral Therapy  Diagnosis:PTSD, Bi-Polar D/O  Plan: F/U/ appointments scheduled weekly.  Cecile Coder, [facility]                                                                                                                    Cecile Coder, [facility]  [place] [facility] Couns elor/Therapist Progress Note  Patient ID: Levern Pitter, MRN: [id number],    Date: 04/12/2024 This session was held via video teletherapy. The patient consented to the video teletherapy and was located in his home during this session. He is aware it is the responsibility of the patient to secure confidentiality on his end of the session. The provider was in a private home office for the duration of this session.   The patient  arrived on time for her Caregility session  Time Spent: 58 minutes, 2 PM to 2:58 PM  Treatment Type: Individual Therapy Reported Symptoms: anxiety, panic attacks A fairly difficult week because it was his daughter's birthday.  He said this year was more difficult than last year but he could not pinpoint why.  They did remember her about going out to a nice restaurant which is something her daughter always wanted to do.  He said the y were able to laugh and joke about funny things that his daughter had said or done which is not something they were able to do this time last year so he saw that as healthy grieving.  He knows that the time change threw things off a little bit for everybody and his wife still is not feeling well and his back has been bothering him.  He was not able to go to his first round of physical therapy this week because his back was bothering hi m more.  He has been thinking a lot more about his daughter which he feels comfortable with.  He is thankful that it is getting warmer and staying light longer so he can get back to  some of the coping skills such as fishing that he has done in the past.   We will continue to work on coping skills for helping with his anxiety and the progression of his Lewy body dementia diagnosis as well as processing his grief. Mental Status Exam:  Appearance:  Well Groomed  Behavior: Appropriate Motor: Pt. Reports some instability due to Lewey Body dementia Speech/Language:  Normal Rate Affect: Appropriate Mood: normal Thought process: normal Thought content:  WNL Sensory/Perceptual disturbances:  WNL Orientation: oriented to person, place, and situation Attention: Good Concentration: Good Memory: Impaired short term Fund of knowledge:  Good Insight:  Good Ju dgment:  Good Impulse Control: Good  Risk Assessment: Danger to Self: No Self-injurious Behavior: No Danger to Others: No Duty to Warn:no Physical Aggression / Violence:No  Access to Firearms a concern: No  Gang Involvement:No  Treatment plan: Will employ cognitive behavioral therapy as well as grief and loss therapy for relief of anxiety and grief.  Goals of grief therapy or to have a healthier response to the loss of his d aughter and less sadness as evidenced by patient report in therapy notes as well as to help him feel less overwhelmed by her loss.  Goals are to see a 50% reduction in grief symptoms over the next 6 months.  I will encourage the use of the patient telling his grief story about his daughter including encouraging him to bring pictures and memorabilia to help process his feelings including any feelings of guilt associated with her loss.  W e will use cognitive behavioral therapy to help identify and change anxiety producing thoughts and behavior patterns so as to improve his ability to better manage anxiety and stress, and manage thoughts and worrisome thinking contributing to anxiety.  We will also refresh and encourage dialectical behavior therapy distress  tolerance and mindfulness skills with the  intention of reducing anxiety by 50% over the next 6 months. Progress: 3 0% Interventions: Cognitive Behavioral Therapy  Diagnosis:PTSD, Bi-Polar D/O  Plan: F/U/ appointments scheduled weekly.  Cecile Coder, [facility]                                                                                                                   Cecile Coder, [facility]               Taraya Steward M Pe ters, [facility]               Cecile Coder, [facility]  [place] [facility] Counselor/Therapist Progress Note   Patient ID: Gardner Servantes, MRN: [id number],    Date: 04/12/2024 This session was held via video  teletherapy. The patient consented to the video teletherapy and was located in his home during this session. He is aware it is the responsibility of the patient to secure confidentiality on his end of the session. The provider was in a private home office for the duration of this session.   The patient arrived on time for her Careg ility session  Time Spent: 57 minutes, 2 PM to 2:57 PM  Treatment Type: Individual Therapy Reported Symptoms: anxiety, panic attacks The patient is still having significant back pain.  He cannot sit anywhere for very long and has to be careful where he sits.  There is still some unsteadiness although he did not report any falls over the past 2 weeks.  His biggest concern now is for his wife.  She has been having some difficulty bre athing and they did a test so they found a small hole on the back side of her heart.  She is meeting with her cardiologist again next week to see what possible treatment is for her.  He still thinks there may be some issue with her diaphragm but she is meeting with the pulmonologist also.  He is doing what he can to help her.  His daughter's birthday is March 11 so he knows that is a difficult time for both he and his wife next week.   Typically they do something to remember her but because of the wife's current health issues they cannot go to [place] as they had planned so they are talking about what to do to remember and celebrate her birthday.  We will continue to work on coping skills for helping with his anxiety and the progression of his Lewy body dementia diagnosis as well as processing his grief. Mental Status Exam: Appearance:  Well Groomed  Beh avior: Appropriate Motor: Pt. Reports some instability due to Lewey Body dementia Speech/Language:  Normal Rate Affect: Appropriate Mood: normal Thought process: normal Thought content:  WNL Sensory/Perceptual disturbances:  WNL Orientation: oriented to person, place, and  situation Attention: Good Concentration: Good Memory: Impaired short term Fund of knowledge:  Good Insight:  Good Judgment:  Good Impulse Control: Go od  Risk Assessment: Danger to Self: No Self-injurious Behavior: No Danger to Others: No Duty to Warn:no Physical Aggression / Violence:No  Access to Firearms a concern: No  Gang Involvement:No  Treatment plan: Will employ cognitive behavioral therapy as well as grief and loss therapy for relief of anxiety and grief.  Goals of grief therapy or to have a healthier response to the loss of his daughter and less sadness as eviden ced by patient report in therapy notes as well as to help him feel less overwhelmed by her loss.  Goals are to see a 50% reduction in grief symptoms over the next 6 months.  I will encourage the use of the patient telling his grief story about his daughter including encouraging him to bring pictures and memorabilia to help process his feelings including any feelings of guilt associated with her loss.  We will use cognitive behavioral th erapy to help identify and change anxiety producing thoughts and behavior patterns so as to improve his ability to better manage anxiety and stress, and manage thoughts and worrisome thinking contributing to anxiety.  We will also refresh and encourage dialectical behavior therapy distress tolerance and mindfulness  skills with the intention of reducing anxiety by 50% over the next 6 months. Progress: 30% Interventions: Cognitive Behav ioral Therapy  Diagnosis:PTSD, Bi-Polar D/O  Plan: F/U/ appointments scheduled weekly.  Cecile Coder, [facility]                                                                                                                   Cecile Coder, [facility]  [place] [facility] Counselor/Therapist Progress Note  Patient ID: Charvez Voorhies, MRN: [id number],    Date: 04/12/2024 This session was held via video teletherapy. The patie nt consented to the video teletherapy and was located in his home during this session. He is aware it is the responsibility of the patient to secure confidentiality on his end of the session. The provider was in a private home office for the duration of this session.   The patient arrived on time for her Caregility session  Time Spent: 57 minutes, 2 PM to 2:57 PM  Treatment Type: Individual Therapy Reported Symptoms: anxiety, pan ic attacks  We will continue to work on coping skills for helping with his anxiety and the progression of his Lewy body dementia diagnosis as well as processing his grief. Mental  Status Exam: Appearance:  Well Groomed  Behavior: Appropriate Motor: Pt. Reports some instability due to Lewey Body dementia Speech/Language:  Normal Rate Affect: Appropriate Mood: normal Thought process: normal Thought content:  WNL Sensory/Perc eptual disturbances:  WNL Orientation: oriented to person, place, and situation Attention: Good Concentration: Good Memory: Impaired short term Fund of knowledge:  Good Insight:  Good Judgment:  Good Impulse Control: Good  Risk Assessment: Danger to Self: No Self-injurious Behavior: No Danger to Others: No Duty to Warn:no Physical Aggression / Violence:No  Access to Firearms a concern: No  Gang Involvement:No  Treat ment plan: Will employ cognitive behavioral therapy as well as grief and loss therapy for relief of anxiety and grief.  Goals of grief therapy or to have a healthier response to the loss of his daughter and less sadness as evidenced by patient report in therapy notes as well as to help him feel less overwhelmed by her loss.  Goals are to see a 50% reduction in grief symptoms over the next 6 months.  I will encourage the use of the patie nt telling his grief story about his daughter including encouraging him to bring pictures and memorabilia to help process his feelings including any feelings of guilt associated with her loss.  We will use cognitive behavioral therapy to help identify and change anxiety producing thoughts and behavior patterns so as to improve his ability to better manage anxiety and stress, and manage thoughts and worrisome thinking contributing to anx iety.  We will also refresh and encourage dialectical behavior therapy distress tolerance and mindfulness skills with the intention of reducing anxiety by 50% over the next 6 months. Progress: 30% Interventions: Cognitive Behavioral Therapy  Diagnosis:PTSD, Bi-Polar D/O  Plan: F/U/ appointments scheduled weekly.  Cecile Coder,  [facility]                                                                                                                    Cecile Coder, [facility]               Cecile Coder, [facility]               Cecile Coder, [facility]  Cecile Coder, [facility]               Cecile Coder, [facility]               Cecile Coder, [facility]                Cecile Coder, [facility]  [place] [facility] Counselor/Therapist Progress Note  Patient ID: Kennet Mccort, MRN: [id number],    Date: 04/12/2024 This session was held via video teletherapy. The  patient consented to the video teletherapy and was located in his home during this session. He is aware it is the responsibility of the patient to secure confidentiality on his end of the session. The provider was in a private home office for the duration of this session.   The patient arrived on time for her Caregility session  Time Spent: 39 minutes,  2 PM to 2:39 PM  Treatment Type: Individual Therapy Reported Symptoms: anxiety, panic attacks The patient was not feeling well today.  He started feeling really earlier in the week having a fever bad headaches body aches.  He felt a little better yesterday but feels a little bit of a resurgence in headaches and body aches and is running a low-grade fever.  His wife is feeling some of the same symptoms.  For the most part he says he i s doing well.  They did increase his memory medication because they were starting to see some increasing decline in short-term memory and he said until recently his long-term memory had been good but he was starting to see a difference in how he used to markers to remember what happened at certain times historically.  He feels the medication has helped and has brightened his mood a little also.  He is still trying to walk every day and  fish when he can and has enjoyed watching the [employer] as a good distraction.  He reports that he feels he is managing mood fairly well.  He has an appointment in 2 weeks.  He does contract for safety having no thoughts of hurting himself or anyone else. We will continue to work on coping skills for helping with his anxiety and the progression of his Lewy body dementia diagnosis as well as processing his grief. Mental  Status Exam: Appearance:  Well Groomed  Behavior: Appropriate Motor: Pt. Reports some instability due to Lewey Body dementia Speech/Language:  Normal Rate Affect: Appropriate Mood: normal Thought process: normal Thought content:  WNL Sensory/Perceptual  disturbances:  WNL Orientation: oriented to person, place, and situation Attention: Good Concentration: Good Memory: Impaired short term Fund of knowledge:  Good Insig ht:  Good Judgment:  Good Impulse Control: Good  Risk Assessment: Danger to Self: No Self-injurious Behavior: No Danger to Others: No Duty to Warn:no Physical Aggression / Violence:No  Access to Firearms a concern: No  Gang Involvement:No  Treatment plan: Will employ cognitive behavioral therapy as well as grief and loss therapy for relief of anxiety and grief.  Goals of grief therapy or to have a healthier response to the  loss of his daughter and less sadness as evidenced by patient report in therapy notes as well as to help him feel less overwhelmed by her loss.  Goals are to see a 50% reduction in grief symptoms over the next 6 months.  I will encourage the use of the patient telling his grief story about his daughter including encouraging him to bring pictures and memorabilia to help process his feelings including any feelings of guilt associated wit h her loss.  We will use cognitive behavioral therapy to help identify and change anxiety producing thoughts and behavior patterns so as to improve his ability to better manage anxiety and stress, and manage thoughts and worrisome thinking contributing to anxiety.  We will also refresh  and encourage dialectical behavior therapy distress tolerance and mindfulness skills with the intention of reducing anxiety by 50% over the next 6 months . Progress: 30% Interventions: Cognitive Behavioral Therapy  Diagnosis:PTSD, Bi-Polar D/O  Plan: F/U/ appointments scheduled weekly.  Cecile Coder, [facility]                                                                                                                   Cecile Coder, [facility]  [facility] Counselor/Therapist Progress Note  Patient ID: Gabino Hagin, MRN: [id number],    Date: 04/12/2024 This s ession was held via video teletherapy. The patient consented to the video teletherapy and was located in his home during this session. He is aware it is the responsibility of the patient to secure confidentiality on his end of the session. The provider was in a private home office for the duration of this session.   The patient arrived on time for her Caregility session  Time Spent: 58 minutes, 2 PM to 2:58 PM  Treatment Type: Ind ividual Therapy Reported Symptoms: anxiety, panic attacks A fairly difficult week because it was his daughter's birthday.  He said this year was more difficult than last year but he  could not pinpoint why.  They did remember her about going out to a nice restaurant which is something her daughter always wanted to do.  He said they were able to laugh and joke about funny things that his daughter had said or done which is not something  they were able to do this time last year so he saw that as healthy grieving.  He knows that the time change threw things off a little bit for everybody and his wife still is not feeling well and his back has been bothering him.  He was not able to go to his first round of physical therapy this week because his back was bothering him more.  He has been thinking a lot more about his daughter which he feels comfortable with.  He is thankfu l that it is getting warmer and staying light longer so he can get back to some of the coping skills such as fishing that he has done in the past.   We will continue to work on coping skills for helping with his anxiety and the progression of his Lewy body dementia diagnosis as well as processing his grief. Mental Status Exam: Appearance:  Well Groomed  Behavior: Appropriate Motor: Pt. Reports some instability due to Lewey Body  dementia Speech/Language:  Normal Rate Affect: Appropriate Mood: normal Thought process: normal Thought content:  WNL Sensory/Perceptual disturbances:  WNL Orientation: oriented to person, place, and situation Attention: Good Concentration: Good Memory: Impaired short term Fund of knowledge:  Good Insight:  Good Judgment:  Good Impulse Control: Good  Risk Assessment: Danger to Self: No Self-injurious Behavior: No  Danger to Others: No Duty to Warn:no Physical Aggression / Violence:No  Access to Firearms a concern: No  Gang Involvement:No  Treatment plan: Will employ cognitive behavioral therapy as well as grief and loss therapy for relief of anxiety and grief.  Goals of grief therapy or to have a healthier response to the loss of his daughter and less sadness as  evidenced by patient report in therapy notes as well as to help him feel less o verwhelmed by her loss.  Goals are to see a 50% reduction in grief symptoms over the next 6 months.  I will encourage the use of the patient telling his grief story about his daughter including encouraging him to bring pictures and memorabilia to help process his feelings including any feelings of guilt associated with her loss.  We will use cognitive behavioral therapy to help identify and change anxiety producing thoughts and behavior  patterns so as to improve his ability to better manage anxiety and stress, and manage thoughts and worrisome thinking contributing to anxiety.  We will also refresh and encourage dialectical behavior therapy  distress tolerance and mindfulness skills with the intention of reducing anxiety by 50% over the next 6 months. Progress: 30% Interventions: Cognitive Behavioral Therapy  Diagnosis:PTSD, Bi-Polar D/O  Plan: F/U/ appointments  scheduled weekly.  Cecile Coder, [facility]                                                                                                                   Cecile Coder, [facility]               Cecile Coder, [facility]               Cecile Coder, [facility]  [facility] Counselor/Therapist Progress Note  Patient ID: Billy Turvey, MRN: [id number],    Date: 04/12/2024 This session was held via video teletherapy. Th e patient consented to the video teletherapy and was located in his home during this session. He is aware it is the responsibility of the patient to secure confidentiality on his end of the session. The provider was in a private home office for the duration of this session.   The patient arrived on time for her Caregility session  Time Spent: 1:05 PM until 2 PM, 55 minutes Treatment Type: Individual Therapy Reported Symptoms: anx iety, panic attacks The patient said he came in from his walk this morning and saw what he thought was some sort of tic from his wife.  It was not the first time it happened and he wonders if it might be tardive dyskinesia because of some new medications that she is on.  She has a call into the doctor right now and hopes to hear something back soon.  They have narrowed down to the fact they think most of her discomfort in breathing is  coming from the diaphragm and they are looking for alternatives for treatment.  The patient continues to do those things which help him cope including walking, fishing when he can,  spending time with his son and daughter-in-law and grandson.  He also has a way of continued coping with grief he is writing letters to his daughters as a way of expressing his feelings and says that is very beneficial.  He appears to be grieving in a healt hy way but he knows that he to year anniversary of her death is coming up and there is some grief there but says it is not overwhelming.  We will continue to work on coping skills for helping with his anxiety and the progression of his Lewy body dementia diagnosis as well as processing his grief. 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 S peech/Language:  Normal Rate Affect: Appropriate Mood: normal Thought process: normal Thought content:  WNL Sensory/Perceptual disturbances:  WNL Orientation: oriented to person, place, and situation Attention: Good Concentration: Good Memory: Impaired short term Fund of knowledge:  Good Insight:  Good Judgment:  Good Impulse Control: Good  Risk Assessment: Danger to Self: No Self-injurious Behavior: No Danger to O thers: No Duty to Warn:no Physical Aggression / Violence:No  Access to Firearms a concern: No  Gang Involvement:No  Treatment plan: Will employ cognitive behavioral therapy as well as grief and loss therapy for relief of anxiety and grief.  Goals of grief therapy or to have a healthier response to the loss of his daughter and less sadness as evidenced by patient report in therapy notes as well as to help him feel less overwhelmed b y her loss.  Goals are to see a 50% reduction in grief symptoms over the next 6 months.  I will encourage the use of the patient telling his grief story about his daughter including encouraging him to bring pictures and memorabilia to help process his feelings including any feelings of guilt associated with her loss.  We will use cognitive behavioral therapy to help  identify and change anxiety producing thoughts and behavior patterns so  as to improve his ability to better manage anxiety and stress, and manage thoughts and worrisome thinking contributing to anxiety.  We will also refresh and encourage dialectical behavior therapy distress tolerance  and mindfulness skills with the intention of reducing anxiety by 50% over the next 6 months. Progress: 30% Interventions: Cognitive Behavioral Therapy  Diagnosis:PTSD, Bi-Polar D/O  Plan: F/U/ appointments scheduled we ekly.  Cecile Coder, [facility]                                                                                                                   Cecile Coder, [facility]  [facility] Counselor/Therapist Progress Note  Patient ID: Depaul Arizpe, MRN: [id number],    Date: 04/12/2024 This session was held via video teletherapy. The patient consented to the video teletherapy and was located in his home during this session . He is aware it is the responsibility of the patient to secure confidentiality on his end of the session. The provider was in a private home office for the duration of this session.   The patient arrived on time for her Caregility session  Time Spent: 59 minutes, 2 PM to 2:59 PM  Treatment Type: Individual Therapy Reported Symptoms: anxiety, panic attacks The patient continues to present with physical pain and both his back and  his knee.  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  He has continued to walk because he knows that is good for him in various ways but says it has been painful.  His wife also was not feeling well but she is going back to the doctor next week t o have an ablation and hopes that will bring her some relief.  For the most part he has been at the last couple weeks reaching back out and hearing from some old friends as well as time with his son and son's family.  He says that his wife's cousin is coming down for a weekend soon and they enjoy time with her and that he will go to his brother's house sometime in the next few weeks.  He recognizes the need for people that he cares abou t in helping with his depression. We will continue to work on coping skills for helping with his anxiety and the progression of his Lewy body dementia diagnosis as well as processing  his grief. 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 Speech/Language:  Normal Rate Affect: Appropriate Mood: normal Thought process: normal Thought content :  WNL Sensory/Perceptual disturbances:  WNL Orientation: oriented to person, place, and situation Attention: Good Concentration: Good Memory: Impaired short term Fund of knowledge:  Good Insight:  Good Judgment:  Good Impulse Control: Good  Risk Assessment: Danger to Self: No Self-injurious Behavior: No Danger to Others: No Duty to Warn:no Physical Aggression / Violence:No  Access to Firearms a concern: No  Gang I nvolvement:No  Treatment plan: Will employ cognitive behavioral therapy as well as grief and loss therapy for relief of anxiety and grief.  Goals of grief therapy or to have a healthier response to the loss of his daughter and less sadness as evidenced by patient report in therapy notes as well as to help him feel less overwhelmed by her loss.  Goals are to see a 50% reduction in grief symptoms over the next 6 months.  I will encourage  the use of the patient telling his grief story about his daughter including encouraging him to bring pictures and memorabilia to help process his feelings including any feelings of guilt associated with her loss.  We will use cognitive behavioral therapy to help identify and change anxiety producing thoughts and behavior patterns so as to improve his ability to better manage anxiety and stress, and manage thoughts and worrisome thinkin g contributing to anxiety.  We will also  refresh and encourage dialectical behavior therapy distress tolerance and mindfulness skills with the intention of reducing anxiety by 50% over the next 6 months. Progress: 30% Interventions: Cognitive Behavioral Therapy  Diagnosis:PTSD, Bi-Polar D/O  Plan: F/U/ appointments scheduled weekly.  Cecile Coder,  [facility]                                                                                                                    Cecile Coder, [facility]               Cecile Coder, [facility]               Cecile Coder, [facility]               Cecile Coder, [facility]  [facility] Counselor/Therapist Progress Note  Patient ID: Anastacio Bua, MRN: [id number],    Date: 04/12/2024 This session was held via video teletherapy. The patient consented to the video teletherapy and was l ocated in his home during this session. He is aware it is the responsibility of the patient to secure  confidentiality on his end of the session. The provider was in a private home office for the duration of this session.   The patient arrived on time for her Caregility session  Time Spent: 57 minutes, 2 PM to 2:57 PM  Treatment Type: Individual Therapy Reported Symptoms: anxiety, panic attacks The patient is still having signifi cant back pain.  He cannot sit anywhere for very long and has to be careful where he sits.  There is still some unsteadiness although he did not report any falls over the past 2 weeks.  His biggest concern now is for his wife.  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  She is meeting with her cardiologist again next week to see what possible treatment is  for her.  He still thinks there may be some issue with her diaphragm but she is meeting with the pulmonologist also.  He is doing what he can to help her.  His daughter's birthday is March 11 so he knows that is a difficult time for both he and his wife next week.  Typically they do something to remember her but because of the wife's current health issues they cannot go to [place] as they had planned so they are talking about wh at to do to remember and celebrate her birthday.  We will continue to work on coping skills for helping with his anxiety and the progression of his Lewy body dementia diagnosis as well as processing his grief. 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 Speech/Language:  Normal Rate Affect: Appropriate Mood: normal Thought process: normal  Thought content:  WNL Sensory/Perceptual disturbances:  WNL Orientation: oriented to person, place, and situation Attention: Good Concentration: Good Memory: Impaired short term Fund of knowledge:  Good Insight:  Good Judgment:  Good Impulse Control: Good  Risk  Assessment: Danger to Self: No Self-injurious Behavior: No Danger to Others: No Duty to Warn:no Physical Aggression / Violence:No  Access to Firearms a con cern: No  Gang Involvement:No  Treatment plan: Will employ cognitive behavioral therapy as well as grief and loss therapy for relief of anxiety and grief.  Goals of grief therapy or to have a healthier response to the loss of his daughter and less sadness as evidenced by patient report in therapy notes as well as to help him feel less overwhelmed by her loss.  Goals are to see a 50% reduction in grief symptoms over the next 6 months.   I will encourage the use of the patient telling his grief story about his daughter including encouraging him to bring pictures and memorabilia to help process his feelings including any feelings of guilt associated with her loss.  We will use cognitive behavioral therapy to help identify and change anxiety producing thoughts and behavior patterns so as to improve his ability to better manage anxiety and stress, and manage thoughts and  worrisome thinking contributing to anxiety.  We will also refresh and encourage dialectical behavior therapy distress tolerance and mindfulness  skills with the intention of reducing anxiety by 50% over the next 6 months. Progress: 30% Interventions: Cognitive Behavioral Therapy  Diagnosis:PTSD, Bi-Polar D/O  Plan: F/U/ appointments scheduled weekly.  Cecile Coder, [facility]                                                                                                                    Cecile Coder, [facility]  [facility] Counselor/Therapist Progress Note  Patient ID: Zekiel Torian, MRN: [id number],    Date: 04/12/2024 This session was held via video teletherapy. The patient consented to the video teletherapy and was located in his home during this session. He is aware it is the responsibility of the patient to secure confidentiality on his e nd of the session. The provider was in a private home office for the duration of this session.   The patient arrived on time for her Caregility session  Time Spent: 58 minutes, 2 PM to 2:58 PM  Treatment Type: Individual Therapy Reported Symptoms: anxiety, panic attacks A fairly difficult week because it was his daughter's birthday.  He said this year was more difficult than last year but he could not pinpoint why.  They did rem ember her about going out to a nice restaurant which is something her daughter always wanted to do.  He said they were able to laugh and joke about funny things that his daughter had  said or done which is not something they were able to do this time last year so he saw that as healthy grieving.  He knows that the time change threw things off a little bit for everybody and his wife still is not feeling well and his back has been botherin g him.  He was not able to go to his first round of physical therapy this week because his back was bothering him more.  He has been thinking a lot more about his daughter which he feels comfortable with.  He is thankful that it is getting warmer and staying light longer so he can get back to some of the coping skills such as fishing that he has done in the past.   We will continue to work on coping skills for helping with his anxiet y and the progression of his Lewy body dementia diagnosis as well as processing his grief. 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 Speech/Language:  Normal Rate Affect: Appropriate Mood: normal Thought process: normal Thought content:  WNL Sensory/Perceptual disturbances:  WNL Orientation: oriented to person, place, and situation  Attention: Good Concentration: Good Memory: Impaired short term Fund of knowledge:  Good Insight:  Good Judgment:  Good Impulse Control: Good  Risk Assessment: Danger to Self: No Self-injurious Behavior: No Danger to Others: No Duty to Warn:no Physical Aggression / Violence:No  Access to Firearms a concern: No  Gang Involvement:No  Treatment plan: Will employ cognitive behavioral therapy as well as grief and loss thera py for relief of anxiety and grief.  Goals of grief therapy or to have a healthier response to the loss of his daughter and less sadness as evidenced by patient report in therapy notes as well as to help him feel less overwhelmed by her loss.  Goals are to see a 50% reduction in grief symptoms over the next 6 months.  I will encourage the use of the patient telling  his grief story about his daughter including encouraging him to bring pi ctures and memorabilia to help process his feelings including any feelings of guilt associated with her loss.  We will use cognitive behavioral therapy to help identify and change anxiety producing thoughts and behavior patterns so as to improve his ability to better manage anxiety and stress, and manage thoughts and worrisome thinking contributing to anxiety.  We will also refresh and encourage dialectical behavior therapy distress  tol erance and mindfulness skills with the intention of reducing anxiety by 50% over the next 6 months. Progress: 30% Interventions: Cognitive Behavioral Therapy  Diagnosis:PTSD, Bi-Polar D/O  Plan: F/U/ appointments scheduled weekly.  Cecile Coder, [facility]                                                                                                                    Cecile Coder, [facility]               Cecile Coder, [facility]               Cecile Coder, [facility]  [facility] Counselor/Therapist Progress Note  Patient ID: Omid Deardorff, MRN: [id number],    Date: 04/12/2024 This session was held via video teletherapy. The patient consented to the video teletherapy and was located in his home during this session. He is aware it is the responsibility of the patient to secure confidentiality on his end of the session. The provid er was in a private home office for the duration of this session.   The patient arrived on time for her Caregility session  Time Spent: 57 minutes, 2 PM to 2:57 PM  Treatment Type: Individual Therapy Reported Symptoms: anxiety, panic attacks The patient is still having significant back pain.  He cannot sit anywhere for very long and has to be careful where he sits.  There is still some unsteadiness although he did not report any  falls over the past 2 weeks.  His biggest concern now is for his wife.  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  She is meeting with her cardiologist again next week to see what possible treatment is for her.  He still thinks there may be some issue with her diaphragm but she is meeting with the pulmonologist also.  He is doing what he can to help her.   His daughter's birthday is March 11 so he knows that is a difficult time for both he and his wife next week.  Typically they do something to remember her but because of the wife's  current health issues they cannot go to [place] as they had planned so they are talking about what to do to remember and celebrate her birthday.  We will continue to work on coping skills for helping with his anxiety and the progression of his Lewy  body dementia diagnosis as well as processing his grief. 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 Speech/Language:  Normal Rate Affect: Appropriate Mood: normal Thought process: normal Thought content:  WNL Sensory/Perceptual disturbances:  WNL Orientation: oriented to person, place, and situation Attention: Good Concentration: Go od Memory: Impaired short term Fund of knowledge:  Good Insight:  Good Judgment:  Good Impulse Control: Good  Risk Assessment: Danger to Self: No Self-injurious Behavior: No Danger to Others: No Duty to Warn:no Physical Aggression / Violence:No  Access to Firearms a concern: No  Gang Involvement:No  Treatment plan: Will employ cognitive behavioral therapy as well as grief and loss therapy for relief of anxiety and grief .  Goals of grief therapy or to have a healthier response to the loss of his daughter and less sadness as evidenced by patient report in therapy notes as well as to help him feel less overwhelmed by her loss.  Goals are to see a 50% reduction in grief symptoms over the next 6 months.  I will encourage the use of the patient telling his grief story about his daughter including encouraging him to bring pictures and memorabilia to help pro cess his feelings including any feelings of guilt associated with her loss.  We will use cognitive behavioral therapy to help identify and change anxiety producing thoughts and behavior patterns so as to improve his ability to better manage anxiety and stress, and manage thoughts and worrisome thinking contributing to anxiety.  We will also refresh and encourage  dialectical behavior therapy distress tolerance and mindfulness skills  with  the intention of reducing anxiety by 50% over the next 6 months. Progress: 30% Interventions: Cognitive Behavioral Therapy  Diagnosis:PTSD, Bi-Polar D/O  Plan: F/U/ appointments scheduled weekly.  Cecile Coder, [facility]                                                                                                                    Cecile Coder, [facility]  [facility] Counselor/Therapist Progress Note  Patient ID:  Yaasir Menken, MRN: [id number],    Date: 04/12/2024 This session was held via video teletherapy. The  patient consented to the video teletherapy and was located in his home during this session. He is aware it is the responsibility of the patient to secure confidentiality on his end of the session. The provider was in a private home office for the duration of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 Caregility session  Time Spent: 58 minutes, 2 PM to 2:58 PM  Treatment Type: Individual Therapy Reported Symptoms: anxiety, panic attacks A fairly difficult week because it was his daughter's birthday.  He said this year was more difficult than last year but he could not pinpoint  why.  They did remember her about going out to a nice restaurant which is something her daughter always wanted to do.  He said they were able to laugh and joke about funny things that his daughter had said or done which is not something they were able to do this time last year so he saw that as healthy grieving.  He knows that the time change threw things off a little bit for everybody and his wife still is not feeling well and his bac k has been bothering him.  He was not able to go to his first round of physical therapy this week because his back was bothering him more.  He has been thinking a lot more about his daughter which he feels comfortable with.  He is thankful that it is getting warmer and staying light longer so he can get back to some of the coping skills such as fishing that he has done in the past.   We will continue to work on coping skills for help ing with his anxiety and the progression of his Lewy body dementia diagnosis as well as processing his grief. 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 Speech/Language:  Normal Rate Affect: Appropriate Mood: normal Thought process: normal Thought content:  WNL Sensory/Perceptual disturbances:  WNL Orientation: oriented to person, pla ce, and situation Attention: Good Concentration: Good Memory: Impaired short term Fund of knowledge:  Good Insight:  Good Judgment:  Good Impulse Control: Good  Risk Assessment: Danger to Self: No Self-injurious Behavior: No Danger to Others: No Duty to  Warn:no Physical Aggression / Violence:No  Access to Firearms a concern: No  Gang Involvement:No  Treatment plan: Will employ cognitive behavioral therapy as well as g rief and loss therapy for relief of anxiety and grief.  Goals of grief therapy or to have a healthier response to the loss of his daughter and less sadness as evidenced by patient report in therapy notes as well as to help him feel less overwhelmed by her loss.  Goals are to see a 50% reduction in grief symptoms over the next 6 months.  I will encourage the use of the patient telling his grief story about his daughter including encourag ing him to bring pictures and memorabilia to help process his feelings including any feelings of guilt associated with her loss.  We will use cognitive behavioral therapy to help identify and change anxiety producing thoughts and behavior patterns so as to improve his ability to better manage anxiety and stress, and manage thoughts and worrisome thinking contributing to anxiety.  We will also refresh and encourage dialectical behavior t herapy  distress tolerance and mindfulness skills with the intention of reducing anxiety by 50% over the next 6 months. Progress: 30% Interventions: Cognitive Behavioral Therapy  Diagnosis:PTSD, Bi-Polar D/O  Plan: F/U/ appointments scheduled weekly.  Cecile Coder, [facility]                                                                                                                    Cecile Coder, [facility]               Cecile Coder, [facility]               Cecile Coder, [facility]  [place] [facility] Counselor/Therapist Progress Note  Patient ID: Dmari Schubring, MRN: [id number],    Date: 04/01/2024 This session was held via video teletherapy. The patient consented to the video teletherapy and was located in his home during this session. He is aware it is the responsibility of the patient to secure confidentiality on his end of the  session. The provider was in a private home office for the duration of this session.   The patient arrived on time for her Caregility session  Time Spent: 57 minutes, 2 PM to 2:57 PM  Treatment Type: Individual Therapy Reported Symptoms: anxiety, panic attacks The patient is still having significant back pain.  He cannot sit anywhere for very long and has to be careful where he sits.  There is still some unsteadiness although he  did not report any falls over the past 2 weeks.  His biggest concern now is for his wife.  She has been having some difficulty breathing and they did a test so they found a small hole  on the back side of her heart.  She is meeting with her cardiologist again next week to see what possible treatment is for her.  He still thinks there may be some issue with her diaphragm but she is meeting with the pulmonologist also.  He is doing what h e can to help her.  His daughter's birthday is March 11 so he knows that is a difficult time for both he and his wife next week.  Typically they do something to remember her but because of the wife's current health issues they cannot go to [place] as they had planned so they are talking about what to do to remember and celebrate her birthday.  We will continue to work on coping skills for helping with his anxiety and the progr ession of his Lewy body dementia diagnosis as well as processing his grief. 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 Speech/Language:  Normal Rate Affect: Appropriate Mood: normal Thought process: normal Thought content:  WNL Sensory/Perceptual disturbances:  WNL Orientation: oriented to person, place, and situation Attention: Good  Concentration: Good Memory: Impaired short term Fund of knowledge:  Good Insight:  Good Judgment:  Good Impulse Control: Good  Risk Assessment: Danger to Self: No Self-injurious Behavior: No Danger to Others: No Duty to Warn:no Physical Aggression / Violence:No  Access to Firearms a concern: No  Gang Involvement:No  Treatment plan: Will employ cognitive behavioral therapy as well as grief and loss therapy for relief o f anxiety and grief.  Goals of grief therapy or to have a healthier response to the loss of his daughter and less sadness as evidenced by patient report in therapy notes as well as to help him feel less overwhelmed by her loss.  Goals are to see a 50% reduction in grief symptoms over the next 6 months.  I will encourage the use of the patient telling his grief story  about his daughter including encouraging him to bring pictures and memo rabilia to help process his feelings including any feelings of guilt associated with her loss.  We will use cognitive behavioral therapy to help identify and change anxiety producing thoughts and behavior patterns so as to improve his ability to better manage anxiety and stress, and manage thoughts and worrisome thinking contributing to anxiety.  We will also refresh and encourage dialectical behavior therapy distress tolerance and mind fulness  skills with the intention of reducing anxiety by 50% over the next 6 months. Progress: 30% Interventions: Cognitive Behavioral Therapy  Diagnosis:PTSD, Bi-Polar D/O  Plan: F/U/ appointments scheduled weekly.  Cecile Coder, [facility]                                                                                                                    Cecile Coder, [facility]  [place] [facility] Counselor/Therapist Progress  Note  Patient ID: Trebor Galdamez, MRN: [id number],    Date: 04/01/2024 This session was held via video teletherapy. The patient consented to the video teletherapy and was located in his home during this session. He is aware it is the responsibility of the patient to secure confidentiality on his end of the session. The provider was in a private home office for the duration of this session.   The patient arrived on time for her  Caregility session  Time Spent: 57 minutes, 2 PM to 2:57 PM  Treatment Type: Individual Therapy Reported Symptoms: anxiety, panic attacks  We will continue to work on coping skills for helping with his anxiety and the progression of his Lewy body dementia diagnosis as well as processing his grief. Mental Status Exam: Appearance:  Well Groomed  Behavior: Appropriate Motor: Pt. Reports some instability due to Lewey Body dement ia Speech/Language:  Normal Rate Affect: Appropriate Mood: normal Thought process: normal Thought content:  WNL Sensory/Perceptual disturbances:  WNL Orientation: oriented to person, place, and situation Attention: Good Concentration: Good Memory: Impaired short term Fund of knowledge:  Good Insight:  Good Judgment:  Good Impulse Control: Good  Risk Assessment: Danger to Self: No Self-injurious Behavior: No Danger  to Others: No Duty to Warn:no Physical Aggression / Violence:No  Access to Firearms a concern: No  Gang Involvement:No  Treatment plan: Will employ cognitive behavioral therapy as  well as grief and loss therapy for relief of anxiety and grief.  Goals of grief therapy or to have a healthier response to the loss of his daughter and less sadness as evidenced by patient report in therapy notes as well as to help him feel less overwhel med by her loss.  Goals are to see a 50% reduction in grief symptoms over the next 6 months.  I will encourage the use of the patient telling his grief story about his daughter including encouraging him to bring pictures and memorabilia to help process his feelings including any feelings of guilt associated with her loss.  We will use cognitive behavioral therapy to help identify and change anxiety producing thoughts and behavior patter ns so as to improve his ability to better manage anxiety and stress, and manage thoughts and worrisome thinking contributing to anxiety.  We will also refresh and encourage dialectical behavior therapy distress tolerance and mindfulness skills with the intention of reducing anxiety by 50% over the next 6 months. Progress: 30% Interventions: Cognitive Behavioral Therapy  Diagnosis:PTSD, Bi-Polar D/O  Plan: F/U/ appointments schedul ed weekly.  Cecile Coder, [facility]                                                                                                                   Cecile Coder, [facility]               Cecile Coder, [facility]               Cecile Coder, [facility]  Cecile Coder, [facility]                Cecile Coder, [facility]               Cecile Coder, [facility]               Cecile Coder,  [facility]  [place] [facility] Counselor/Therapist Progress Note  Patient ID: Julio Zappia, MRN: [id number],    Date: 04/01/2024 This session was held via video teletherapy. The patient consented to the video teletherapy and was located in his home during this session. He is aware it is the responsibility of the patient to secure confidentiality on his end of the session. The provider was in a private ho me office for the duration of this session.   The patient arrived on time for her Caregility session  Time Spent: 39 minutes, 2 PM to 2:39 PM  Treatment Type: Individual Therapy Reported Symptoms: anxiety, panic attacks The patient was not feeling well today.  He started feeling really earlier in the week having a fever  bad headaches body aches.  He felt a little better yesterday but feels a little bit of a resurgence in headach es and body aches and is running a low-grade fever.  His wife is feeling some of the same symptoms.  For the most part he says he is doing well.  They did increase his memory medication because they were starting to see some increasing decline in short-term memory and he said until recently his long-term memory had been good but he was starting to see a difference in how he used to markers to remember what happened at certain times hist orically.  He feels the medication has helped and has brightened his mood a little also.  He is still trying to walk every day and fish when he can and has enjoyed watching the [employer] as a good distraction.  He reports that he feels he is managing mood fairly well.  He has an appointment in 2 weeks.  He does contract for safety having no thoughts of hurting himself or anyone else. We will continue to work on coping s kills for helping with his anxiety and the progression of his Lewy body dementia diagnosis as well as processing his grief. 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 Speech/Language:  Normal Rate Affect: Appropriate Mood: normal Thought process: normal Thought content:  WNL Sensory/Perceptual disturbances:  WNL Orientation: oriented  to person, place, and situation Attention: Good Concentration: Good Memory: Impaired short term Fund of knowledge:  Good Insight:  Good Judgment:  Good Impulse Control: Good  Risk Assessment: Danger to Self: No Self-injurious Behavior: No Danger to Others: No Duty to Warn:no Physical Aggression / Violence:No  Access to Firearms a concern: No  Gang Involvement:No  Treatment plan: Will employ cognitive behavioral therap y as well as grief and loss therapy for relief of anxiety and grief.   Goals of grief therapy or to have a healthier response to the loss of his daughter and less sadness as evidenced by patient report in therapy notes as well as to help him feel less overwhelmed by her loss.  Goals are to see a 50% reduction in grief symptoms over the next 6 months.  I will encourage the use of the patient telling his grief story about his daughter incl uding encouraging him to bring pictures and memorabilia to help process his feelings including any feelings of guilt associated with her loss.  We will use cognitive behavioral therapy to help identify and change anxiety producing thoughts and behavior patterns so as to improve his ability to better manage anxiety and stress, and manage thoughts and worrisome thinking contributing to anxiety.  We will also refresh and  encourage dialecti cal behavior therapy distress tolerance and mindfulness skills with the intention of reducing anxiety by 50% over the next 6 months. Progress: 30% Interventions: Cognitive Behavioral Therapy  Diagnosis:PTSD, Bi-Polar D/O  Plan: F/U/ appointments scheduled weekly.  Cecile Coder, [facility]                                                                                                                    Cecile Coder, [facility]  [place]  Behavioral Health Counselor/Therapist Progress Note  Patient ID: Willson Lipa, MRN: [id number],    Date: 04/01/2024 This session was held via video teletherapy. The patient consented to the video teletherapy and was located in his home during this session. He is aware it is the responsibility of the patient to secure confidentiality on his end of the session. The provider was in a private home office for the duration of this se ssion.   The patient arrived on time for her Caregility session  Time Spent: 58 minutes, 2 PM to 2:58 PM  Treatment Type: Individual Therapy Reported Symptoms: anxiety, panic attacks A fairly difficult week because it was his daughter's birthday.  He said this year was more difficult than last year but he could not pinpoint why.  They did remember her about going out to a nice restaurant which is something her daughter always wa nted to do.  He said they were able to laugh and joke about funny things that his daughter had said or done which is not something they were able to do this time last year so he saw  that as healthy grieving.  He knows that the time change threw things off a little bit for everybody and his wife still is not feeling well and his back has been bothering him.  He was not able to go to his first round of physical therapy this week because h is back was bothering him more.  He has been thinking a lot more about his daughter which he feels comfortable with.  He is thankful that it is getting warmer and staying light longer so he can get back to some of the coping skills such as fishing that he has done in the past.   We will continue to work on coping skills for helping with his anxiety and the progression of his Lewy body dementia diagnosis as well as processing his grie f. 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 Speech/Language:  Normal Rate Affect: Appropriate Mood: normal Thought process: normal Thought content:  WNL Sensory/Perceptual disturbances:  WNL Orientation: oriented to person, place, and situation Attention: Good Concentration: Good Memory: Impaired short term Fund of knowledge:  G ood Insight:  Good Judgment:  Good Impulse Control: Good  Risk Assessment: Danger to Self: No Self-injurious Behavior: No Danger to Others: No Duty to Warn:no Physical Aggression / Violence:No  Access to Firearms a concern: No  Gang Involvement:No  Treatment plan: Will employ cognitive behavioral therapy as well as grief and loss therapy for relief of anxiety and grief.  Goals of grief therapy or to have a healthier respo nse to the loss of his daughter and less sadness as evidenced by patient report in therapy notes as well as to help him feel less overwhelmed by her loss.  Goals are to see a 50% reduction in grief symptoms over the next 6 months.  I will encourage the use of the patient telling his grief story about his daughter including encouraging him to bring pictures and  memorabilia to help process his feelings including any feelings of guilt asso ciated with her loss.  We will use cognitive behavioral therapy to help identify and change anxiety producing thoughts and behavior patterns so as to improve his ability to better manage anxiety and stress, and manage thoughts and worrisome thinking contributing to anxiety.  We will also refresh and encourage dialectical behavior therapy  distress tolerance and mindfulness skills with the intention of reducing anxiety by 50% over the nex t 6 months. Progress: 30% Interventions: Cognitive Behavioral Therapy  Diagnosis:PTSD, Bi-Polar D/O  Plan: F/U/ appointments scheduled weekly.  Cecile Coder, [facility]                                                                                                                   Cecile Coder, [facility]                Cecile Coder, [facility]               Cecile Coder, [facility]  [place] [facility] Counselor/Therapist Pr ogress Note  Patient ID: Krystopher Kuenzel, MRN: [id number],    Date: 04/01/2024 This session was held via video teletherapy. The patient consented to the video teletherapy and was located in his home during this session. He is aware it is the responsibility of the patient to secure confidentiality on his end of the session. The provider was in a private home office for the duration of this session.   The patient arrived on time f or her Caregility session  Time Spent: 1:05 PM until 2 PM, 55 minutes Treatment Type: Individual Therapy Reported Symptoms: anxiety, panic attacks The patient said he came in from his walk this morning and saw what he thought was some sort of tic from his wife.  It was not the first time it happened and he wonders if it might be tardive dyskinesia because of some new medications that she is on.  She has a call into the doctor right  now and hopes to hear something back soon.  They have narrowed down to the fact they think most of her discomfort in breathing is coming from the diaphragm and they are looking for alternatives for treatment.  The patient continues to do those things which help him cope including walking, fishing when he can, spending time with his son and daughter-in-law and grandson.  He also has a way of continued coping with grief he is writing l etters to his daughters as a way of expressing his feelings and says that is very beneficial.  He appears to be grieving in a healthy way but he knows that he to year anniversary of her  death is coming up and there is some grief there but says it is not overwhelming.  We will continue to work on coping skills for helping with his anxiety and the progression of his Lewy body dementia diagnosis as well as processing his grief. Mental S tatus Exam: Appearance:  Well Groomed  Behavior: Appropriate Motor: Pt. Reports some instability due to Lewey Body dementia Speech/Language:  Normal Rate Affect: Appropriate Mood: normal Thought process: normal Thought content:  WNL Sensory/Perceptual disturbances:  WNL Orientation: oriented to person, place, and situation Attention: Good Concentration: Good Memory: Impaired short term Fund of knowledge:  Good Insight :  Good Judgment:  Good Impulse Control: Good  Risk Assessment: Danger to Self: No Self-injurious Behavior: No Danger to Others: No Duty to Warn:no Physical Aggression / Violence:No  Access to Firearms a concern: No  Gang Involvement:No  Treatment plan: Will employ cognitive behavioral therapy as well as grief and loss therapy for relief of anxiety and grief.  Goals of grief therapy or to have a healthier response to the l oss of his daughter and less sadness as evidenced by patient report in therapy notes as well as to help him feel less overwhelmed by her loss.  Goals are to see a 50% reduction in grief symptoms over the next 6 months.  I will encourage the use of the patient telling his grief story about his daughter including encouraging him to bring pictures and memorabilia to help process his feelings including any feelings of guilt associated with  her loss.  We will use cognitive behavioral therapy to help identify and change anxiety producing thoughts and behavior patterns so as to improve his ability to better manage anxiety and stress, and manage thoughts and worrisome thinking contributing to anxiety.  We will also refresh and encourage dialectical behavior therapy distress tolerance and  mindfulness skills  with the intention of reducing anxiety by 50% over the next 6 months.  Progress: 30% Interventions: Cognitive Behavioral Therapy  Diagnosis:PTSD, Bi-Polar D/O  Plan: F/U/ appointments scheduled weekly.  Cecile Coder, [facility]                                                                                                                   Cecile Coder, [facility]  [place] [facility] Counselor/Therapist Progress Note  Patient ID: Dionisios Ricci, MRN: [id number],    Date: 04/01/2024 This ses sion was held via video teletherapy. The patient consented to the video teletherapy and was located in his home  during this session. He is aware it is the responsibility of the patient to secure confidentiality on his end of the session. The provider was in a private home office for the duration of this session.   The patient arrived on time for her Caregility session  Time Spent: 59 minutes, 2 PM to 2:59 PM  Treatment Type: Indiv idual Therapy Reported Symptoms: anxiety, panic attacks The patient continues to present with physical pain and both his back and his knee.  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  He has continued to walk because he knows that is good for hi m in various ways but says it has been painful.  His wife also was not feeling well but she is going back to the doctor next week to have an ablation and hopes that will bring her some relief.  For the most part he has been at the last couple weeks reaching back out and hearing from some old friends as well as time with his son and son's family.  He says that his wife's cousin is coming down for a weekend soon and they enjoy time with h er and that he will go to his brother's house sometime in the next few weeks.  He recognizes the need for people that he cares about in helping with his depression. We will continue to work on coping skills for helping with his anxiety and the progression of his Lewy body dementia diagnosis as well as processing his grief. Mental Status Exam: Appearance:  Well Groomed  Behavior: Appropriate Motor: Pt. Reports some instability due  to Lewey Body dementia Speech/Language:  Normal Rate Affect: Appropriate Mood: normal Thought process: normal Thought content:  WNL Sensory/Perceptual disturbances:  WNL Orientation: oriented to person, place, and situation Attention: Good Concentration: Good Memory: Impaired short  term Fund of knowledge:  Good Insight:  Good Judgment:  Good Impulse Control: Good  Risk Assessment: Danger to Self: No Self-injurious  Behavior: No Danger to Others: No Duty to Warn:no Physical Aggression / Violence:No  Access to Firearms a concern: No  Gang Involvement:No  Treatment plan: Will employ cognitive behavioral therapy as well as grief and loss therapy for relief of anxiety and grief.  Goals of grief therapy or to have a healthier response to the loss of his daughter and less sadness as evidenced by patient report in therapy notes as well as to help h im feel less overwhelmed by her loss.  Goals are to see a 50% reduction in grief symptoms over the next 6 months.  I will encourage the use of the patient telling his grief story about his daughter including encouraging him to bring pictures and memorabilia to help process his feelings including any feelings of guilt associated with her loss.  We will use cognitive behavioral therapy to help identify and change anxiety producing thought s and behavior patterns so as to improve his ability to better manage anxiety and stress, and manage thoughts and worrisome thinking contributing to anxiety.  We will  also refresh and encourage dialectical behavior therapy distress tolerance and mindfulness skills with the intention of reducing anxiety by 50% over the next 6 months. Progress: 30% Interventions: Cognitive Behavioral Therapy  Diagnosis:PTSD, Bi-Polar D/O  Plan: F/U/  appointments scheduled weekly.  Cecile Coder, [facility]                                                                                                                   Cecile Coder, [facility]               Cecile Coder, [facility]               Cecile Coder, [facility]               C raig Kelly Patient,  [facility]  [place] [facility] Counselor/Therapist Progress Note  Patient ID: Jrake Rodriquez, MRN: [id number],    Date: 04/01/2024 This session was held via video teletherapy. The patient consented to the video teletherapy and was located in his home during this session. He is aware it is the responsibility of the patient to secure confidentiality on his end of the session. The provider was in a private home office for the duration of this session.   The patient arrived on time for her Caregility session  Time Spent: 57 minutes, 2  PM to 2:57 PM  Treatment Type: Individual Therapy Reported Symptoms: anxiety, panic attacks The patient is still having significant back pain.  He cannot sit anywhere for very long and has to be  careful where he sits.  There is still some unsteadiness although he did not report any falls over the past 2 weeks.  His biggest concern now is for his wife.  She has been having some difficulty breathing and they did a test so they found a  small hole on the back side of her heart.  She is meeting with her cardiologist again next week to see what possible treatment is for her.  He still thinks there may be some issue with her diaphragm but she is meeting with the pulmonologist also.  He is doing what he can to help her.  His daughter's birthday is March 11 so he knows that is a difficult time for both he and his wife next week.  Typically they do something to remember h er but because of the wife's current health issues they cannot go to [place] as they had planned so they are talking about what to do to remember and celebrate her birthday.  We will continue to work on coping skills for helping with his anxiety and the progression of his Lewy body dementia diagnosis as well as processing his grief. Mental Status Exam: Appearance:  Well Groomed  Behavior: Appropriate Motor: Pt. Reports som e instability due to Lewey Body dementia Speech/Language:  Normal Rate Affect: Appropriate Mood: normal Thought process: normal Thought content:  WNL Sensory/Perceptual disturbances:  WNL Orientation: oriented to person, place, and situation Attention: Good Concentration: Good Memory: Impaired short term Fund of knowledge:  Good Insight:  Good Judgment:  Good Impulse Control: Good  Risk Assessment: Danger to Self: N o Self-injurious Behavior: No Danger to Others: No Duty to Warn:no Physical Aggression / Violence:No  Access to Firearms a concern: No  Gang Involvement:No  Treatment plan: Will employ cognitive behavioral therapy as well as grief and loss therapy for relief of anxiety and grief.  Goals of grief therapy or to have a healthier response to the loss of his daughter and less  sadness as evidenced by patient report in therapy notes as  well as to help him feel less overwhelmed by her loss.  Goals are to see a 50% reduction in grief symptoms over the next 6 months.  I will encourage the use of the patient telling his grief story about his daughter including encouraging him to bring pictures and memorabilia to help process his feelings including any feelings of guilt associated with her loss.  We will use cognitive behavioral therapy to help identify and change anxiety  producing thoughts and behavior patterns so as to improve his ability to better manage anxiety and stress, and manage thoughts and worrisome thinking contributing to anxiety.  We will also refresh and encourage dialectical behavior therapy distress tolerance and mindfulness  skills with the intention of reducing anxiety by 50% over the next 6 months. Progress: 30% Interventions: Cognitive Behavioral Therapy  Diagnosis:PTSD, Bi-Polar  D/O  Plan: F/U/ appointments scheduled weekly.  Cecile Coder, [facility]                                                                                                                   Cecile Coder, [facility]  [place] [facility] Counselor/Therapist Progress Note  Patient ID: Rashidi Loh, MRN: [id number],    Date: 04/01/2024 This session was held via video teletherapy. The patient consented to the video teletherapy and  was located in his home during this session. He is aware it is the responsibility of the patient to secure confidentiality on his end of the session. The provider was in a private home office for the duration of this session.   The patient arrived on time for her Caregility session  Time Spent: 58 minutes, 2 PM to 2:58 PM  Treatment Type: Individual Therapy Reported Symptoms: anxiety, panic attacks A fairly difficult week becaus e it was his daughter's birthday.  He said this year was more difficult than last year but he could not pinpoint why.  They did remember her about going out to a nice restaurant which is something her daughter always wanted to do.  He said they were able to laugh and joke about funny things that his daughter had said or done which is not something they were able to do this time last year so he saw that as healthy grieving.  He knows tha t the time change threw things off a little bit for everybody and his wife still is not feeling well and his back has been bothering him.  He was not able to go to his first round of  physical therapy this week because his back was bothering him more.  He has been thinking a lot more about his daughter which he feels comfortable with.  He is thankful that it is getting warmer and staying light longer so he can get back to some of the cop ing skills such as fishing that he has done in the past.   We will continue to work on coping skills for helping with his anxiety and the progression of his Lewy body dementia diagnosis as well as processing his grief. 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 Speech/Language:  Normal Rate Affect: Appropriate Mood: normal Thought proce ss: normal Thought content:  WNL Sensory/Perceptual disturbances:  WNL Orientation: oriented to person, place, and situation Attention: Good Concentration: Good Memory: Impaired short term Fund of knowledge:  Good Insight:  Good Judgment:  Good Impulse Control: Good  Risk Assessment: Danger to Self: No Self-injurious Behavior: No Danger to Others: No Duty to Warn:no Physical Aggression / Violence:No  Access to Fire arms a concern: No  Gang Involvement:No  Treatment plan: Will employ cognitive behavioral therapy as well as grief and loss therapy for relief of anxiety and grief.  Goals of grief therapy or to have a healthier response to the loss of his daughter and less sadness as evidenced by patient report in therapy notes as well as to help him feel less overwhelmed by her loss.  Goals are to see a 50% reduction in grief symptoms over the next  6 months.  I will encourage the use of the patient telling his grief story about his daughter including encouraging him to bring pictures and memorabilia to help process his feelings including any feelings of guilt associated with her loss.  We will use cognitive behavioral therapy to help identify and change anxiety producing thoughts and behavior patterns so as to  improve his ability to better manage anxiety and stress, and manage tho ughts and worrisome thinking contributing to anxiety.  We will also refresh and encourage dialectical behavior therapy  distress tolerance and mindfulness skills with the intention of reducing anxiety by 50% over the next 6 months. Progress: 30% Interventions: Cognitive Behavioral Therapy  Diagnosis:PTSD, Bi-Polar D/O  Plan: F/U/ appointments scheduled weekly.  Cecile Coder, [facility]                                                                                                                    Cecile Coder, [facility]               Cecile Coder, [facility]               Cecile Coder, [facility]  [place] [facility] Counselor/Therapist Progress Note  Patient ID: Daren Yeagle, MRN: [id number],    Date: 04/01/2024 This session was held via video teletherapy. The patient consented to the video teletherapy and was located in his home durin g this session. He is aware it is the responsibility of the patient to secure confidentiality on his end of the session. The provider was in a private home office for the duration of this session.   The patient arrived on time for her Caregility session  Time Spent: 57 minutes, 2 PM to 2:57 PM  Treatment Type: Individual Therapy Reported Symptoms: anxiety, panic attacks The patient is still having significant back pain.  He cann ot sit anywhere for very long and has to be careful where he sits.  There is still some unsteadiness although he did not report any falls over the past 2 weeks.  His biggest concern now is for his wife.  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  She is meeting with her cardiologist again next week to see what possible treatment is for her.  He still think s there may be some issue with her diaphragm but she is meeting with the pulmonologist also.  He is doing what he can to help her.  His daughter's birthday is March 11 so he knows that is a difficult time for both he and his wife next week.  Typically they do something to remember her but because of the wife's current health issues they cannot go to [place] as they had planned so they are talking about what to do to remember and  celebrate her birthday.  We will continue to work on coping skills for helping with his anxiety and the progression of his Lewy body dementia diagnosis as well as processing his  grief. 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 Speech/Language:  Normal Rate Affect: Appropriate Mood: normal Thought process: normal Thought content:  WNL  Sensory/Perceptual disturbances:  WNL Orientation: oriented to person, place, and situation Attention: Good Concentration: Good Memory: Impaired short term Fund of knowledge:  Good Insight:  Good Judgment:  Good Impulse Control: Good  Risk Assessment: Danger to Self: No Self-injurious Behavior: No Danger to Others: No Duty to Warn:no Physical Aggression / Violence:No  Access to Firearms a concern: No  Thane Finch ent:No  Treatment plan: Will employ cognitive behavioral therapy as well as grief and loss therapy for relief of anxiety and grief.  Goals of grief therapy or to have a healthier response to the loss of his daughter and less sadness as evidenced by patient report in therapy notes as well as to help him feel less overwhelmed by her loss.  Goals are to see a 50% reduction in grief symptoms over the next 6 months.  I will encourage the us  e of the patient telling his grief story about his daughter including encouraging him to bring pictures and memorabilia to help process his feelings including any feelings of guilt associated with her loss.  We will use cognitive behavioral therapy to help identify and change anxiety producing thoughts and behavior patterns so as to improve his ability to better manage anxiety and stress, and manage thoughts and worrisome thinking contr ibuting to anxiety.  We will also refresh and encourage dialectical behavior therapy distress tolerance and mindfulness skills  with the intention of reducing anxiety by 50% over the next 6 months. Progress: 30% Interventions: Cognitive Behavioral Therapy  Diagnosis:PTSD, Bi-Polar D/O  Plan: F/U/ appointments scheduled weekly.  Cecile Coder,  [facility]                                                                                                                    Cecile Coder, [facility]  [place] [facility] Counselor/Therapist Progress Note  Patient ID: Iver Miklas, MRN: [id number],    Date: 04/01/2024 This session was held via video teletherapy. The patient consented to the video teletherapy and was located in his home during this session. He is aware it is the responsibility of the patient to secure confidentiality on his end of the session. The p rovider was in a private home office for the duration of this session.   The patient arrived  on time for her Caregility session  Time Spent: 57 minutes, 2 PM to 2:57 PM  Treatment Type: Individual Therapy Reported Symptoms: anxiety, panic attacks  The patient reported that he had not been sleeping well so he did speak with his psychiatrist about adjusting some meds.  Initially they adjusted too much up and he said he felt grogg y for the entire next day but they have found a combination now where he feels like he is getting more rested and only feels a little drowsy shortly after waking up..  There have been a couple times where he stumbled and one of the times he felt down but did not get hurt the other times he was able to catch himself.  He is trying to stay as active as he can walking daily and fishing with his wife when he can.  His biggest anxiety now is  his wife has an upcoming surgery on her diaphragm in 2 weeks so he knows that will be a fairly significant recovery time but also sees the difficulties she is having now and wants her to get some relief..  His son and daughter-in-law will be supports for them also.  He continues to speak with friends and family members as encouragement.  He also feels that he is grieving appropriately writing to his daughter on a regular basis about th e things that are going on in his and his wife's lives.  We will continue to work on coping skills for helping with his anxiety and the progression of his Lewy body dementia diagnosis as well as processing his grief. 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 Speech/Language:  Normal Rate Affect: Appropriate Mood: normal Thought process:  normal Thought content:  WNL Sensory/Perceptual disturbances:  WNL Orientation: oriented to person, place, and situation Attention: Good Concentration: Good Memory: Impaired short term Fund of knowledge:  Good Insight:  Good Judgment:  Good Impulse  Control: Good  Risk Assessment: Danger to Self: No Self-injurious Behavior: No Danger to Others: No Duty to Warn:no Physical Aggression / Violence:No  Access to Firearm s a concern: No  Gang Involvement:No  Treatment plan: Will employ cognitive behavioral therapy as well as grief and loss therapy for relief of anxiety and grief.  Goals of grief therapy or to have a healthier response to the loss of his daughter and less sadness as evidenced by patient report in therapy notes as well as to help him feel less overwhelmed by her loss.  Goals are to see a 50% reduction in grief symptoms over the next 6 m onths.  I will encourage the use of the patient telling his grief story about his daughter including encouraging him to bring pictures and memorabilia to help process his feelings including any feelings of guilt associated with her loss.  We will use cognitive behavioral therapy to help identify and change anxiety producing thoughts and behavior patterns so as to improve his ability to  better manage anxiety and stress, and manage though ts and worrisome thinking contributing to anxiety.  We will also refresh and encourage dialectical behavior therapy distress tolerance and mindfulness skills with the intention of reducing anxiety by 50% over the next 6 months. Progress: 30% Interventions: Cognitive Behavioral Therapy  Diagnosis:PTSD, Bi-Polar D/O  Plan: F/U/ appointments scheduled weekly.  Cecile Coder, [facility]                                                                                                                    Cecile Coder, [facility]               Cecile Coder, [facility]               Cecile Coder, [facility]               Cecile Coder, [facility]               Cecile Coder, [facility]               Cecile Coder, [facility]               Cecile Coder, [facility]               Cecile Coder, [facility]  [place] [facility] Counselor/Therapist Progress Note  Patient ID: Denim Kalmbach, MRN: [id number],    Date: 04/01/2024 This session was held via video teletherapy. The patient consented to the video teletherapy and was located in his home during this session. He is aware it is the responsibility of the patient to secure confidentiality on his end of the session. The provider was in a private home office for the duration of this  session.   The patient arrived on time for her Caregility session  Time Spent: 59 minutes, 2 PM to 2:59 PM  Treatment Type: Individual Therapy Reported Symptoms: anxiety, panic  attacks The patient said he jokingly said to his wife that he had not had any pain issues in the cervical area and then a few days ago he started having pain in his neck and shoulders.  He now sits related to degenerative disk and has some consistent pain  somewhere in his spine with that but jokingly says it just depends on Wednesday which part of his body is decides to show up and hurt.  He is doing the best that he can.  He is still walking which he knows is good for him but does not have right now the strength to do as much in the morning as he has done in the past so he is denying that to 3 times a day for consistency.  He is still doing some positive things and that he is staying i n touch with his brother and other family members.  He is reconnecting with friends from high school and he is which she is enjoying.  One of them has had a similar experience in that she lost her son in the same way the patient lost his daughter.  He said they did not think and Easter in which they set a place for his daughter at the table and put her picture there.  He initially had reservations about doing that he and his wife had ma de everyone feel as if she was really there with him.  He still feels that he is grieving in a healthy way.  He and his wife have always taken a trip around both of their birthdays which are 2 days apart.  That is now close to his daughter died so they have started taking some of her rashes with him and sprinkling them where they go as a way of remembrance.  They are deciding what that looks like this year in July.  He does contract f or safety having no thoughts of hurting himself or anyone else. We will continue to work on coping skills for helping with his anxiety and the progression of his Lewy body dementia diagnosis as well as processing his grief. Mental Status Exam: Appearance:  Well Groomed  Behavior: Appropriate Motor: Pt. Reports some instability due to Lewey Body  dementia Speech/Language:  Normal Rate Affect: Appropriate Mood: normal Thought pr ocess: normal Thought content:  WNL Sensory/Perceptual disturbances:  WNL Orientation: oriented to person, place, and situation Attention: Good Concentration: Good Memory: Impaired short term Fund of knowledge:  Good Insight:  Good Judgment:  Good Impulse Control: Good  Risk Assessment: Danger to Self: No Self-injurious Behavior: No Danger to Others: No Duty to Warn:no Physical Aggression / Violence:No  Access to F irearms a concern: No  Gang Involvement:No  Treatment plan: Will employ cognitive behavioral therapy as well as grief and loss therapy for relief of anxiety and grief.  Goals of grief  therapy or to have a healthier response to the loss of his daughter and less sadness as evidenced by patient report in therapy notes as well as to help him feel less overwhelmed by her loss.  Goals are to see a 50% reduction in grief symptoms over the ne xt 6 months.  I will encourage the use of the patient telling his grief story about his daughter including encouraging him to bring pictures and memorabilia to help process his feelings including any feelings of guilt associated with her loss.  We will use cognitive behavioral therapy to help identify and change anxiety producing thoughts and behavior patterns so as to improve his ability to better manage anxiety and stress, and manage  thoughts and worrisome thinking contributing to anxiety.  We will also refresh and encourage dialectical behavior therapy distress tolerance and mindfulness skills with the intention of reducing anxiety by 50% over the next 6 months. Progress: 30% Interventions: Cognitive Behavioral Therapy  Diagnosis:PTSD, Bi-Polar D/O  Plan: F/U/ appointments scheduled weekly.  Cecile Coder, [facility]                                                                                                                     Cecile Coder, [facility]  [facility] Counselor/Therapist Progress Note  Patient ID: Trevel Dillenbeck, MRN: [id number],    Date: 04/01/2024 This session was held via video teletherapy. The patient consented to the video teletherapy and was located in his home during this session. He is aware it is the responsibility of the patient to secure confidentia lity on his end of the session. The provider was in a private home office for the duration of this session.   The patient arrived on time for her Caregility session  Time Spent: 58  minutes, 2 PM to 2:58 PM  Treatment Type: Individual Therapy Reported Symptoms: anxiety, panic attacks A fairly difficult week because it was his daughter's birthday.  He said this year was more difficult than last year but he could not pinpoint why.   They did remember her about going out to a nice restaurant which is something her daughter always wanted to do.  He said they were able to laugh and joke about funny things that his daughter had said or done which is not something they were able to do this time last year so he saw that as healthy grieving.  He knows that the time change threw things off a little bit for everybody and his wife still is not feeling well and his back has  been bothering him.  He was not able to go to his first round of physical therapy this week because his back was bothering him more.  He has been thinking a lot more about his daughter which he feels comfortable with.  He is thankful that it is getting warmer and staying light longer so he can get back to some of the coping skills such as fishing that he has done in the past.   We will continue to work on coping skills for helping wi th his anxiety and the progression of his Lewy body dementia diagnosis as well as processing his grief. 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 Speech/Language:  Normal Rate Affect: Appropriate Mood: normal Thought process: normal Thought content:  WNL Sensory/Perceptual disturbances:  WNL Orientation: oriented to person, place, an d situation Attention: Good Concentration: Good Memory: Impaired short term Fund of knowledge:  Good Insight:  Good Judgment:  Good Impulse Control: Good  Risk Assessment: Danger to Self: No Self-injurious Behavior: No Danger to Others: No Duty to Warn:no Physical Aggression / Violence:No  Access to Firearms a concern: No  Gang Involvement:No   Treatment plan: Will employ cognitive behavioral therapy as well as grief a nd loss therapy for relief of anxiety and grief.  Goals of grief therapy or to have a healthier response to the loss of his daughter and less sadness as evidenced by patient report in therapy notes as well as to help him feel less overwhelmed by her loss.  Goals are to see a 50% reduction in grief symptoms over the next 6 months.  I will encourage the use of the patient telling his grief story about his daughter including encouraging hi m to bring pictures and memorabilia to help process his feelings including any feelings of guilt associated with her loss.  We will use cognitive behavioral therapy to help identify and change anxiety producing thoughts and behavior patterns so as to improve his ability to better manage anxiety and stress, and manage thoughts and worrisome thinking contributing to anxiety.  We will also refresh and encourage dialectical behavior therapy  distress tolerance and mindfulness skills with the intention of reducing anxiety by 50% over the next 6 months. Progress: 30% Interventions: Cognitive Behavioral Therapy  Diagnosis:PTSD, Bi-Polar D/O  Plan: F/U/ appointments scheduled weekly.  Cecile Coder, [facility]                                                                                                                    Cecile Coder, [facility]               Cecile Coder, [facility]               Cecile Coder, [facility]  [facility] Counselor/Therapist Progress Note  Patient ID: Ahmod Gillespie, MRN: [id number],    Date: 04/01/2024 This session was held via video teletherapy. The patient consented to the video teletherapy and was located in his home during this session. He is aware it is the responsibility of the patient to secure confidentiality on his end of the session. The provi der was in a private home office for the duration of this session.   The patient arrived on time for her Caregility session  Time Spent: 1:05 PM until 2 PM, 55 minutes Treatment Type: Individual Therapy Reported Symptoms: anxiety, panic attacks The patient said he came in from his walk this morning and saw what he thought was some sort of tic from his wife.  It was not the first time it happened and he wonders if it might be tar dive dyskinesia because of some new medications that she is on.  She has a call into the doctor right now and hopes to hear something back soon.  They have narrowed down to the fact  they think most of her discomfort in breathing is coming from the diaphragm and they are looking for alternatives for treatment.  The patient continues to do those things which help him cope including walking, fishing when he can, spending time with his so n and daughter-in-law and grandson.  He also has a way of continued coping with grief he is writing letters to his daughters as a way of expressing his feelings and says that is very beneficial.  He appears to be grieving in a healthy way but he knows that he to year anniversary of her death is coming up and there is some grief there but says it is not overwhelming.  We will continue to work on coping skills for helping with his anxie ty and the progression of his Lewy body dementia diagnosis as well as processing his grief. 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 Speech/Language:  Normal Rate Affect: Appropriate Mood: normal Thought process: normal Thought content:  WNL Sensory/Perceptual disturbances:  WNL Orientation: oriented to person, place, and situation  Attention: Good Concentration: Good Memory: Impaired short term Fund of knowledge:  Good Insight:  Good Judgment:  Good Impulse Control: Good  Risk Assessment: Danger to Self: No Self-injurious Behavior: No Danger to Others: No Duty to Warn:no Physical Aggression / Violence:No  Access to Firearms a concern: No  Gang Involvement:No  Treatment plan: Will employ cognitive behavioral therapy as well as grief and loss ther apy for relief of anxiety and grief.  Goals of grief therapy or to have a healthier response to the loss of his daughter and less sadness as evidenced by patient report in therapy notes as well as to help him feel less overwhelmed by her loss.  Goals are to see a 50% reduction in grief symptoms over the next 6 months.  I will encourage the use of the patient  telling his grief story about his daughter including encouraging him to bring p ictures and memorabilia to help process his feelings including any feelings of guilt associated with her loss.  We will use cognitive behavioral therapy to help identify and change anxiety producing thoughts and behavior patterns so as to improve his ability to better manage anxiety and stress, and manage thoughts and worrisome thinking contributing to anxiety.  We will also refresh and encourage dialectical behavior therapy distress to lerance  and mindfulness skills with the intention of reducing anxiety by 50% over the next 6 months. Progress: 30% Interventions: Cognitive Behavioral Therapy  Diagnosis:PTSD, Bi-Polar D/O  Plan: F/U/ appointments scheduled weekly.  Cecile Coder, [facility]                                                                                                                    Cecile Coder, [facility]  [facility] Counselor/The rapist Progress Note  Patient ID: Trinten Boudoin, MRN: [id number],    Date: 04/01/2024 This session was held via video teletherapy. The patient consented to the video teletherapy and was located in his home during this session. He is aware it is the responsibility of the patient to secure confidentiality on his end of the session. The provider was in a private home office for the duration of this session.   The patient arrived  on time for her Caregility session  Time Spent: 59 minutes, 2 PM to 2:59 PM  Treatment Type: Individual Therapy Reported Symptoms: anxiety, panic attacks The patient continues to present with physical pain and both his back and his knee.  He started with a new physical therapist and told him that there was a lot of arthritis in both knees and or certain things he cannot do but they had him do that anyway and now for the past 3 days  he has been in significant knee pain.  He has continued to walk because he knows that is good for him in various ways but says it has been painful.  His wife also was not feeling well but she is going back to the doctor next week to have an ablation and hopes that will bring her some relief.  For the most part he has been at the last couple weeks reaching back out and hearing from some old friends as well as time with his son and son's  family.  He says that his wife's cousin is coming down for a weekend soon and they enjoy time with her and that he will go to his brother's house sometime in the next few weeks.  He  recognizes the need for people that he cares about in helping with his depression. We will continue to work on coping skills for helping with his anxiety and the progression of his Lewy body dementia diagnosis as well as processing his grief. Mental Statu s Exam: Appearance:  Well Groomed  Behavior: Appropriate Motor: Pt. Reports some instability due to Lewey Body dementia Speech/Language:  Normal Rate Affect: Appropriate Mood: normal Thought process: normal Thought content:  WNL Sensory/Perceptual disturbances:  WNL Orientation: oriented to person, place, and situation Attention: Good Concentration: Good Memory: Impaired short term Fund of knowledge:  Good Insight:   Good Judgment:  Good Impulse Control: Good  Risk Assessment: Danger to Self: No Self-injurious Behavior: No Danger to Others: No Duty to Warn:no Physical Aggression / Violence:No  Access to Firearms a concern: No  Gang Involvement:No  Treatment plan: Will employ cognitive behavioral therapy as well as grief and loss therapy for relief of anxiety and grief.  Goals of grief therapy or to have a healthier response to the loss  of his daughter and less sadness as evidenced by patient report in therapy notes as well as to help him feel less overwhelmed by her loss.  Goals are to see a 50% reduction in grief symptoms over the next 6 months.  I will encourage the use of the patient telling his grief story about his daughter including encouraging him to bring pictures and memorabilia to help process his feelings including any feelings of guilt associated with her  loss.  We will use cognitive behavioral therapy to help identify and change anxiety producing thoughts and behavior patterns so as to improve his ability to better manage anxiety and stress, and manage thoughts and worrisome thinking contributing to anxiety.  We will also  refresh and encourage dialectical behavior therapy distress tolerance and mindfulness skills  with the intention of reducing anxiety by 50% over the next 6 months. Pro gress: 30% Interventions: Cognitive Behavioral Therapy  Diagnosis:PTSD, Bi-Polar D/O  Plan: F/U/ appointments scheduled weekly.  Cecile Coder, [facility]                                                                                                                   Cecile Coder, [facility]               Cr aig Kelly Patient, [facility]               Cecile Coder, [facility]               Cecile Coder, [facility]  [facility] Counselor/Therapist Progress Note  Patient ID: Ajahni Nay, MRN: [id number],     Date: 04/01/2024 This session was held via video teletherapy. The patient consented to the video teletherapy and was located in his home during this session. He is aware it is the responsibility of the patient to secure confidentiality on his end of the session. The provider was in a private home office for the duration of t his session.   The patient arrived on time for her Caregility session  Time Spent: 57 minutes, 2 PM to 2:57 PM  Treatment Type: Individual Therapy Reported Symptoms: anxiety, panic attacks The patient is still having significant back pain.  He cannot sit anywhere for very long and has to be careful where he sits.  There is still some unsteadiness although he did not report any falls over the past 2 weeks.  His biggest concern no w is for his wife.  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  She is meeting with her cardiologist again next week to see what possible treatment is for her.  He still thinks there may be some issue with her diaphragm but she is meeting with the pulmonologist also.  He is doing what he can to help her.  His daughter's birthday is March 11 so he knows th at is a difficult time for both he and his wife next week.  Typically they do something to remember her but because of the wife's current health issues they cannot go to [place] as they had planned so they are talking about what to do to remember and celebrate her birthday.  We will continue to work on coping skills for helping with his anxiety and the progression of his Lewy body dementia diagnosis as well as processing his gri ef. 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 Speech/Language:  Normal Rate Affect: Appropriate Mood: normal Thought process: normal Thought content:  WNL Sensory/Perceptual disturbances:   WNL Orientation: oriented to person, place, and situation Attention: Good Concentration: Good Memory: Impaired short term Fund of knowledge:   Good Insight:  Good Judgment:  Good Impulse Control: Good  Risk Assessment: Danger to Self: No Self-injurious Behavior: No Danger to Others: No Duty to Warn:no Physical Aggression / Violence:No  Access to Firearms a concern: No  Gang Involvement:No  Treatment plan: Will employ cognitive behavioral therapy as well as grief and loss therapy for relief of anxiety and grief.  Goals of grief therapy or to have a healthier resp onse to the loss of his daughter and less sadness as evidenced by patient report in therapy notes as well as to help him feel less overwhelmed by her loss.  Goals are to see a 50% reduction in grief symptoms over the next 6 months.  I will encourage the use of the patient telling his grief story about his daughter including encouraging him to bring pictures and memorabilia to help process his feelings including any feelings of guilt ass ociated with her loss.  We will use cognitive behavioral therapy to help identify and change anxiety producing thoughts and behavior patterns so as to improve his ability to better manage anxiety and stress, and manage thoughts and worrisome thinking contributing to anxiety.  We will also refresh and encourage dialectical behavior therapy distress tolerance and mindfulness  skills with the intention of reducing anxiety by 50% over the ne xt 6 months. Progress: 30% Interventions: Cognitive Behavioral Therapy  Diagnosis:PTSD, Bi-Polar D/O  Plan: F/U/ appointments scheduled weekly.  Cecile Coder, [facility]                                                                                                                   Cecile Coder, [facility]  [facility] Counselor/Therapist Progress Note  Patient ID: Izaiyah Kleinman, MRN: [id number],    Date: 5/30/2 025 This session was held via video teletherapy. The patient consented to the video teletherapy and was located in his home during this session. He is aware it is the responsibility of the patient to secure confidentiality on his end of the session. The provider was in a private home office for the duration of this session.   The patient arrived on time for her Caregility session  Time Spent: 58 minutes, 2 PM to 2:58 PM  Treatmen t Type: Individual Therapy Reported Symptoms: anxiety, panic attacks A fairly difficult week because it was his daughter's birthday.  He said this year was more difficult than last  year but he could not pinpoint why.  They did remember her about going out to a nice restaurant which is something her daughter always wanted to do.  He said they were able to laugh and joke about funny things that his daughter had said or done which is not  something they were able to do this time last year so he saw that as healthy grieving.  He knows that the time change threw things off a little bit for everybody and his wife still is not feeling well and his back has been bothering him.  He was not able to go to his first round of physical therapy this week because his back was bothering him more.  He has been thinking a lot more about his daughter which he feels comfortable with.  He  is thankful that it is getting warmer and staying light longer so he can get back to some of the coping skills such as fishing that he has done in the past.   We will continue to work on coping skills for helping with his anxiety and the progression of his Lewy body dementia diagnosis as well as processing his grief. Mental Status Exam: Appearance:  Well Groomed  Behavior: Appropriate Motor: Pt. Reports some instability due to  Lewey Body dementia Speech/Language:  Normal Rate Affect: Appropriate Mood: normal Thought process: normal Thought content:  WNL Sensory/Perceptual disturbances:  WNL Orientation: oriented to person, place, and situation Attention: Good Concentration: Good Memory: Impaired short term Fund of knowledge:  Good Insight:  Good Judgment:  Good Impulse Control: Good  Risk Assessment: Danger to Self: No Self-injurious Be havior: No Danger to Others: No Duty to Warn:no Physical Aggression / Violence:No  Access to Firearms a concern: No  Gang Involvement:No  Treatment plan: Will employ cognitive behavioral therapy as well as grief and loss therapy for relief of anxiety and grief.  Goals of grief therapy or to have a healthier response to the loss of his daughter and less sadness  as evidenced by patient report in therapy notes as well as to help him  feel less overwhelmed by her loss.  Goals are to see a 50% reduction in grief symptoms over the next 6 months.  I will encourage the use of the patient telling his grief story about his daughter including encouraging him to bring pictures and memorabilia to help process his feelings including any feelings of guilt associated with her loss.  We will use cognitive behavioral therapy to help identify and change anxiety producing thoughts a nd behavior patterns so as to improve his ability to better manage anxiety and stress, and manage thoughts and worrisome thinking contributing to anxiety.  We will also refresh and encourage dialectical behavior therapy  distress tolerance and mindfulness skills with the intention of reducing anxiety by 50% over the next 6 months. Progress: 30% Interventions: Cognitive Behavioral Therapy  Diagnosis:PTSD, Bi-Polar D/O  Plan: F/U/ ap pointments scheduled weekly.  Cecile Coder, [facility]                                                                                                                   Cecile Coder, [facility]               Cecile Coder, [facility]               Cecile Coder, [facility]  [facility] Counselor/Therapist Progress Note  Patient ID: Emonte Dieujuste, MRN: [id number],    Date: 04/01/2024 This session was held vi a video teletherapy. The patient consented to the video teletherapy and was located in his home during this session. He is aware it is the responsibility of the patient to secure confidentiality on his end of the session. The provider was in a private home office for the duration of this session.   The patient arrived on time for her Caregility session  Time Spent: 57 minutes, 2 PM to 2:57 PM  Treatment Type: Individual Therapy R eported Symptoms: anxiety, panic attacks The patient is still having significant back pain.  He cannot sit anywhere for very long and has to be careful where he sits.  There is still some unsteadiness although he did not report any falls over the past 2 weeks.  His biggest concern now is for his wife.  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  She is mee ting with her cardiologist again next week to see what possible treatment is for her.  He still thinks there may be some issue with her diaphragm but she is meeting with the  pulmonologist also.  He is doing what he can to help her.  His daughter's birthday is March 11 so he knows that is a difficult time for both he and his wife next week.  Typically they do something to remember her but because of the wife's current health issues the y cannot go to [place] as they had planned so they are talking about what to do to remember and celebrate her birthday.  We will continue to work on coping skills for helping with his anxiety and the progression of his Lewy body dementia diagnosis as well as processing his grief. 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 Speech/Langu age:  Normal Rate Affect: Appropriate Mood: normal Thought process: normal Thought content:  WNL Sensory/Perceptual disturbances:  WNL Orientation: oriented to person, place, and situation Attention: Good Concentration: Good Memory: Impaired short term Fund of knowledge:  Good Insight:  Good Judgment:  Good Impulse Control: Good  Risk Assessment: Danger to Self: No Self-injurious Behavior: No Danger to Others: No  Duty to Warn:no Physical Aggression / Violence:No  Access to Firearms a concern: No  Gang Involvement:No  Treatment plan: Will employ cognitive behavioral therapy as well as grief and loss therapy for relief of anxiety and grief.  Goals of grief therapy or to have a healthier response to the loss of his daughter and less sadness as evidenced by patient report in therapy notes as well as to help him feel less overwhelmed by her loss.   Goals are to see a 50% reduction in grief symptoms over the next 6 months.  I will encourage the use of the patient telling his grief story about his daughter including encouraging him to bring pictures and memorabilia to help process his feelings including any feelings of guilt associated with her loss.  We will use cognitive behavioral therapy to help  identify and change anxiety producing thoughts and behavior patterns so as to impr ove his ability to better manage anxiety and stress, and manage thoughts and worrisome thinking contributing to anxiety.  We will also refresh and encourage dialectical behavior therapy distress tolerance and mindfulness  skills with the intention of reducing anxiety by 50% over the next 6 months. Progress: 30% Interventions: Cognitive Behavioral Therapy  Diagnosis:PTSD, Bi-Polar D/O  Plan: F/U/ appointments scheduled weekly.  Cr aig Kelly Patient, [facility]                                                                                                                   Cecile Coder, [facility]  [place] [facility] Counselor/Therapist Progress Note  Patient ID: Andree Golphin, MRN: [id number],    Date: 04/01/2024 This session was held via video teletherapy. The patient consented to the video teletherapy and was located in his home during this session. He is awa re it is the responsibility of the patient to secure confidentiality on his end of the session. The provider was in a private home office for the duration of this session.   The patient arrived on time for her Caregility session  Time Spent: 57 minutes, 2 PM to 2:57 PM  Treatment Type: Individual Therapy Reported Symptoms: anxiety, panic attacks  We will continue to work on coping skills for helping with his anxiety and the pro gression of his Lewy body dementia diagnosis as well as processing his grief. 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 Speech/Language:  Normal Rate Affect: Appropriate Mood: normal Thought process: normal Thought content:  WNL Sensory/Perceptual disturbances:  WNL Orientation: oriented to person, place, and situation Attention: Go od Concentration: Good Memory: Impaired short term Fund of knowledge:  Good Insight:  Good Judgment:  Good Impulse Control: Good  Risk Assessment: Danger to Self: No Self-injurious Behavior: No Danger to Others: No Duty to Warn:no Physical Aggression / Violence:No  Access to Firearms a concern: No  Gang Involvement:No  Treatment plan: Will employ cognitive behavioral therapy as well as grief and loss therapy for relief  of anxiety and grief.  Goals of grief therapy or to have a healthier response to the loss of his daughter and less sadness as evidenced by patient report in therapy notes as well as to  help him feel less overwhelmed by her loss.  Goals are to see a 50% reduction in grief symptoms over the next 6 months.  I will encourage the use of the patient telling his grief story about his daughter including encouraging him to bring pictures and me morabilia to help process his feelings including any feelings of guilt associated with her loss.  We will use cognitive behavioral therapy to help identify and change anxiety producing thoughts and behavior patterns so as to improve his ability to better manage anxiety and stress, and manage thoughts and worrisome thinking contributing to anxiety.  We will also refresh and encourage dialectical behavior therapy distress tolerance and mi ndfulness skills with the intention of reducing anxiety by 50% over the next 6 months. Progress: 30% Interventions: Cognitive Behavioral Therapy  Diagnosis:PTSD, Bi-Polar D/O  Plan: F/U/ appointments scheduled weekly.  Cecile Coder, [facility]                                                                                                                    Cecile Coder, [facility]               Cecile Coder, [facility]               Cecile Coder, [facility]  Cecile Coder, [facility]               Cecile Coder, [facility]               Cecile Coder, [facility]               Cecile Coder, [facility]  [place] [facility] Counselor/Therapist Progress Note  Patient ID: Kwesi Sangha, MRN: [id number],    Date: 04/01/2024 This session was held via video teletherapy. The patient consente d to the video teletherapy and was located in his home during this session. He is aware it is the responsibility of the patient to secure confidentiality on his end of the session. The provider was in a private home office for the duration of this session.   The patient arrived on time for her Caregility session  Time Spent: 39 minutes, 2 PM to 2:39 PM  Treatment Type: Individual Therapy Reported Symptoms: anxiety, panic attacks  The patient was not feeling well today.  He started feeling really earlier in the week having a fever bad headaches body aches.  He felt a little better yesterday but feels a little  bit of a resurgence in headaches and body aches and is running a low-grade fever.  His wife is feeling some of the same symptoms.  For the most part he says he is doing well.  They did increase his memory medication because they were starting to see some in creasing decline in short-term memory and he said until recently his long-term memory had been good but he was starting to see a difference in how he used to markers to remember what happened at certain times historically.  He feels the medication has helped and has brightened his mood a little also.  He is still trying to walk every day and fish when he can and has enjoyed watching the [employer] as a good distraction.  He  reports that he feels he is managing mood fairly well.  He has an appointment in 2 weeks.  He does contract for safety having no thoughts of hurting himself or anyone else. We will continue to work on coping skills for helping with his anxiety and the progression of his Lewy body dementia diagnosis as well as processing his grief. Mental Status Exam: Appearance:  Well Groomed  Behavior: Appropriate Motor: Pt. Reports some inst ability due to Lewey Body dementia Speech/Language:  Normal Rate Affect: Appropriate Mood: normal Thought process: normal Thought content:  WNL Sensory/Perceptual disturbances:  WNL Orientation: oriented to person, place, and situation Attention: Good Concentration: Good Memory: Impaired short term Fund of knowledge:  Good Insight:  Good Judgment:  Good Impulse Control: Good  Risk Assessment: Danger to Self: No Sel f-injurious Behavior: No Danger to Others: No Duty to Warn:no Physical Aggression / Violence:No  Access to Firearms a concern: No  Gang Involvement:No  Treatment plan: Will employ cognitive behavioral therapy as well as grief and loss therapy for relief of anxiety and grief.  Goals of grief therapy or to have a healthier response to the loss of his daughter  and less sadness as evidenced by patient report in therapy notes as well a s to help him feel less overwhelmed by her loss.  Goals are to see a 50% reduction in grief symptoms over the next 6 months.  I will encourage the use of the patient telling his grief story about his daughter including encouraging him to bring pictures and memorabilia to help process his feelings including any feelings of guilt associated with her loss.  We will use cognitive behavioral therapy to help identify and change anxiety produc ing thoughts and behavior patterns so as to improve his ability to better manage anxiety and stress, and manage thoughts and worrisome thinking contributing to anxiety.  We will also refresh  and encourage dialectical behavior therapy distress tolerance and mindfulness skills with the intention of reducing anxiety by 50% over the next 6 months. Progress: 30% Interventions: Cognitive Behavioral Therapy  Diagnosis:PTSD, Bi-Polar D/O   Plan: F/U/ appointments scheduled weekly.  Cecile Coder, [facility]                                                                                                                   Cecile Coder, [facility]  [place] [facility] Counselor/Therapist Progress Note  Patient ID: Chrisangel Eskenazi, MRN: [id number],    Date: 04/01/2024 This session was held via video teletherapy. The patient consented to the video teletherapy and was lo cated in his home during this session. He is aware it is the responsibility of the patient to secure confidentiality on his end of the session. The provider was in a private home office for the duration of this session.   The patient arrived on time for her Caregility session  Time Spent: 58 minutes, 2 PM to 2:58 PM  Treatment Type: Individual Therapy Reported Symptoms: anxiety, panic attacks A fairly difficult week because it w as his daughter's birthday.  He said this year was more difficult than last year but he could not pinpoint why.  They did remember her about going out to a nice restaurant which is something her daughter always wanted to do.  He said they were able to laugh and joke about funny things that his daughter had said or done which is not something they were able to do this time last year so he saw that as healthy grieving.  He knows that the  time change threw things off a little bit for everybody and his wife still is not feeling well and his back has been bothering him.  He was not able to go to his first round of physical  therapy this week because his back was bothering him more.  He has been thinking a lot more about his daughter which he feels comfortable with.  He is thankful that it is getting warmer and staying light longer so he can get back to some of the coping sk ills such as fishing that he has done in the past.   We will continue to work on coping skills for helping with his anxiety and the progression of his Lewy body dementia diagnosis as well as processing his grief. 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 Speech/Language:  Normal Rate Affect: Appropriate Mood: normal Thought process: no rmal Thought content:  WNL Sensory/Perceptual disturbances:  WNL Orientation: oriented to person, place, and situation Attention: Good Concentration: Good Memory: Impaired short term Fund of knowledge:  Good Insight:  Good Judgment:  Good Impulse Control: Good  Risk Assessment: Danger to Self: No Self-injurious Behavior: No Danger to Others: No Duty to Warn:no Physical Aggression / Violence:No  Access to Firearms a  concern: No  Gang Involvement:No  Treatment plan: Will employ cognitive behavioral therapy as well as grief and loss therapy for relief of anxiety and grief.  Goals of grief therapy or to have a healthier response to the loss of his daughter and less sadness as evidenced by patient report in therapy notes as well as to help him feel less overwhelmed by her loss.  Goals are to see a 50% reduction in grief symptoms over the next 6 mont hs.  I will encourage the use of the patient telling his grief story about his daughter including encouraging him to bring pictures and memorabilia to help process his feelings including any feelings of guilt associated with her loss.  We will use cognitive behavioral therapy to help identify and change anxiety producing thoughts and behavior patterns so as to improve  his ability to better manage anxiety and stress, and manage thoughts  and worrisome thinking contributing to anxiety.  We will also refresh and encourage dialectical behavior therapy  distress tolerance and mindfulness skills with the intention of reducing anxiety by 50% over the next 6 months. Progress: 30% Interventions: Cognitive Behavioral Therapy  Diagnosis:PTSD, Bi-Polar D/O  Plan: F/U/ appointments scheduled weekly.  Cecile Coder, [facility]                                                                                                                    Cecile Coder, [facility]               Cecile Coder, [facility]               Cecile Coder, [facility]  [place] [facility] Counselor/Therapist Progress Note  Patient ID: Illya Gienger, MRN: [id number],    Date: 04/01/2024 This session was held via video teletherapy. The patient consented to the video teletherapy and was located in his home during this session. He  is aware it is the responsibility of the patient to secure confidentiality on his end of the session. The provider was in a private home office for the duration of this session.   The patient arrived on time for her Caregility session  Time Spent: 1:05 PM until 2 PM, 55 minutes Treatment Type: Individual Therapy Reported Symptoms: anxiety, panic attacks The patient said he came in from his walk this morning and saw what he thou ght was some sort of tic from his wife.  It was not the first time it happened and he wonders if it might be tardive dyskinesia because of some new medications that she is on.  She has a call into the doctor right now and hopes to hear something back soon.  They have narrowed down to the fact they think most of her discomfort in breathing is coming from the diaphragm and they are looking for alternatives for treatment.  The patient co ntinues to do those things which help him cope including walking, fishing when he can, spending time with his son and daughter-in-law and grandson.  He also has a way of continued coping with grief he is writing letters to his daughters as a way of expressing his feelings and says that is very beneficial.  He appears to be grieving in a healthy way but he knows that he to year anniversary of her death is coming up and there is some grie f there but says it is not overwhelming.  We will continue to work on coping skills for helping with his anxiety and the progression of his Lewy body dementia diagnosis as well as  processing his grief. 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 Speech/Language:  Normal Rate Affect: Appropriate Mood: normal Thought process: normal Though t content:  WNL Sensory/Perceptual disturbances:  WNL Orientation: oriented to person, place, and situation Attention: Good Concentration: Good Memory: Impaired short term Fund of knowledge:  Good Insight:  Good Judgment:  Good Impulse Control: Good  Risk Assessment: Danger to Self: No Self-injurious Behavior: No Danger to Others: No Duty to Warn:no Physical Aggression / Violence:No  Access to Firearms a concern: No   Gang Involvement:No  Treatment plan: Will employ cognitive behavioral therapy as well as grief and loss therapy for relief of anxiety and grief.  Goals of grief therapy or to have a healthier response to the loss of his daughter and less sadness as evidenced by patient report in therapy notes as well as to help him feel less overwhelmed by her loss.  Goals are to see a 50% reduction in grief symptoms over the next 6 months.  I will  encourage the use of the patient telling his grief story about his daughter including encouraging him to bring pictures and memorabilia to help process his feelings including any feelings of guilt associated with her loss.  We will use cognitive behavioral therapy to help identify and change anxiety producing thoughts and behavior patterns so as to improve his ability to better manage anxiety and stress, and manage thoughts and worrisom e thinking contributing to anxiety.  We will also refresh and encourage dialectical behavior therapy distress tolerance and  mindfulness skills with the intention of reducing anxiety by 50% over the next 6 months. Progress: 30% Interventions: Cognitive Behavioral Therapy  Diagnosis:PTSD, Bi-Polar D/O  Plan: F/U/ appointments scheduled weekly.  Cecile Coder, [facility]                                                                                                                    Cecile Coder, [facility]  [place] [facility] Counselor/Therapist Progress Note  Patient ID: Dakotah Orrego, MRN: [id number],    Date: 04/01/2024 This session was held via video teletherapy. The patient consented to the video teletherapy and was located in his home during this session. He is aware it is the responsibility of the patient to secure confidentiality on his end of th e session. The provider was in a private home office for the duration of this session.   The patient  arrived on time for her Caregility session  Time Spent: 59 minutes, 2 PM to 2:59 PM  Treatment Type: Individual Therapy Reported Symptoms: anxiety, panic attacks The patient continues to present with physical pain and both his back and his knee.  He started with a new physical therapist and told him that there was a lot of arthri tis in both knees and or certain things he cannot do but they had him do that anyway and now for the past 3 days he has been in significant knee pain.  He has continued to walk because he knows that is good for him in various ways but says it has been painful.  His wife also was not feeling well but she is going back to the doctor next week to have an ablation and hopes that will bring her some relief.  For the most part he has been at  the last couple weeks reaching back out and hearing from some old friends as well as time with his son and son's family.  He says that his wife's cousin is coming down for a weekend soon and they enjoy time with her and that he will go to his brother's house sometime in the next few weeks.  He recognizes the need for people that he cares about in helping with his depression. We will continue to work on coping skills for helping with hi s anxiety and the progression of his Lewy body dementia diagnosis as well as processing his grief. 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 Speech/Language:  Normal Rate Affect: Appropriate Mood: normal Thought process: normal Thought content:  WNL Sensory/Perceptual disturbances:  WNL Orientation: oriented to person, place, and sit uation Attention: Good Concentration: Good Memory: Impaired short term Fund of knowledge:  Good Insight:  Good Judgment:  Good Impulse Control: Good  Risk Assessment: Danger to Self: No Self-injurious Behavior: No Danger to Others: No Duty to Warn:no Physical Aggression /  Violence:No  Access to Firearms a concern: No  Gang Involvement:No  Treatment plan: Will employ cognitive behavioral therapy as well as grief and lo ss therapy for relief of anxiety and grief.  Goals of grief therapy or to have a healthier response to the loss of his daughter and less sadness as evidenced by patient report in therapy notes as well as to help him feel less overwhelmed by her loss.  Goals are to see a 50% reduction in grief symptoms over the next 6 months.  I will encourage the use of the patient telling his grief story about his daughter including encouraging him to  bring pictures and memorabilia to help process his feelings including any feelings of guilt associated with her loss.  We will use cognitive behavioral therapy to help identify and change anxiety producing thoughts and behavior patterns so as to improve his ability to better manage anxiety and stress, and manage thoughts and worrisome thinking contributing to anxiety.  We will also  refresh and encourage dialectical behavior therapy dist ress tolerance and mindfulness skills with the intention of reducing anxiety by 50% over the next 6 months. Progress: 30% Interventions: Cognitive Behavioral Therapy  Diagnosis:PTSD, Bi-Polar D/O  Plan: F/U/ appointments scheduled weekly.  Cecile Coder, [facility]                                                                                                                    Cecile Coder, [facility]               Cecile Coder, [facility]               Cecile Coder, [facility]               Cecile Coder, [facility]  [place] [facility] Counselor/Therapist Progress Note  Patient ID: Jahzier Villalon, MRN: [id number],    Date: 04/01/2024 This session was held via video teletherapy. The patient consented to the video teletherapy and was located in his home during this session. He is aware it is the responsibility of the patient to s ecure confidentiality on his end of the session. The provider was in a private home office for the duration of this session.   The patient arrived on time for her Caregility session  Time Spent: 57 minutes, 2 PM to 2:57 PM  Treatment Type: Individual Therapy Reported Symptoms: anxiety, panic attacks The patient is still having significant back pain.  He cannot sit anywhere for very long and has to be careful where he sits.  Ther e is still some unsteadiness although he did not report any falls over the past 2 weeks.  His biggest concern now is for his wife.  She has been having some difficulty breathing and  they did a test so they found a small hole on the back side of her heart.  She is meeting with her cardiologist again next week to see what possible treatment is for her.  He still thinks there may be some issue with her diaphragm but she is meeting with the  pulmonologist also.  He is doing what he can to help her.  His daughter's birthday is March 11 so he knows that is a difficult time for both he and his wife next week.  Typically they do something to remember her but because of the wife's current health issues they cannot go to [place] as they had planned so they are talking about what to do to remember and celebrate her birthday.  We will continue to work on coping skills fo r helping with his anxiety and the progression of his Lewy body dementia diagnosis as well as processing his grief. 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 Speech/Language:  Normal Rate Affect: Appropriate Mood: normal Thought process: normal Thought content:  WNL Sensory/Perceptual disturbances:  WNL Orientation: oriented to perso n, place, and situation Attention: Good Concentration: Good Memory: Impaired short term Fund of knowledge:  Good Insight:  Good Judgment:  Good Impulse Control: Good  Risk Assessment: Danger to Self: No Self-injurious Behavior: No Danger to Others: No Duty to Warn:no Physical Aggression / Violence:No  Access to Firearms a concern: No  Gang Involvement:No  Treatment plan: Will employ cognitive behavioral therapy as wel l as grief and loss therapy for relief of anxiety and grief.  Goals of grief therapy or to have a healthier response to the loss of his daughter and less sadness as evidenced by patient report in therapy notes as well as to help him feel less overwhelmed by her loss.  Goals are to see a 50% reduction in grief symptoms over the next 6 months.  I will encourage the  use of the patient telling his grief story about his daughter including en couraging him to bring pictures and memorabilia to help process his feelings including any feelings of guilt associated with her loss.  We will use cognitive behavioral therapy to help identify and change anxiety producing thoughts and behavior patterns so as to improve his ability to better manage anxiety and stress, and manage thoughts and worrisome thinking contributing to anxiety.  We will also refresh and encourage dialectical beha vior therapy distress tolerance and mindfulness  skills with the intention of reducing anxiety by 50% over the next 6 months. Progress: 30% Interventions: Cognitive Behavioral Therapy  Diagnosis:PTSD, Bi-Polar D/O  Plan: F/U/ appointments scheduled weekly.  Cecile Coder, [facility]                                                                                                                    Cecile Coder, [facility]  [facility] Counselor/Therapist Progress Note  Patient ID: Tamon Parkerson, MRN: [id number],    Date: 04/01/2024 This session was held via video teletherapy. The patient consented to the video teletherapy and was located in his home during this session. He is aware it is the responsibility of the patient to secure confidentiality on his end of the session. The provider was in a private home office for the duration of this session.    The patient arrived on time for her Caregility session  Time Spent: 58 minutes, 2 PM to 2:58 PM  Treatment Type: Individual Therapy Reported Symptoms: anxiety, panic attacks A fairly difficult week because it was his daughter's birthday.  He said this year was more difficult than last year but he could not pinpoint why.  They did remember her about going out to a nice restaurant which is something her daughter always wanted to  do.  He said they were able to laugh and joke about funny things that his daughter had said or done which is not something they were able to do this time last year so he saw that as healthy grieving.  He knows that the time change threw things off a little bit for everybody and his wife still is not feeling well and his back has been bothering him.  He was not able to go to his first round of physical therapy this week because his back  was bothering him more.  He has been thinking a lot more about his daughter which he feels comfortable with.  He is thankful that it is getting warmer and staying light longer so he can  get back to some of the coping skills such as fishing that he has done in the past.   We will continue to work on coping skills for helping with his anxiety and the progression of his Lewy body dementia diagnosis as well as processing his grief. Ment al Status Exam: Appearance:  Well Groomed  Behavior: Appropriate Motor: Pt. Reports some instability due to Lewey Body dementia Speech/Language:  Normal Rate Affect: Appropriate Mood: normal Thought process: normal Thought content:  WNL Sensory/Perceptual disturbances:  WNL Orientation: oriented to person, place, and situation Attention: Good Concentration: Good Memory: Impaired short term Fund of knowledge:  Good Ins ight:  Good Judgment:  Good Impulse Control: Good  Risk Assessment: Danger to Self: No Self-injurious Behavior: No Danger to Others: No Duty to Warn:no Physical Aggression / Violence:No  Access to Firearms a concern: No  Gang Involvement:No  Treatment plan: Will employ cognitive behavioral therapy as well as grief and loss therapy for relief of anxiety and grief.  Goals of grief therapy or to have a healthier response to t he loss of his daughter and less sadness as evidenced by patient report in therapy notes as well as to help him feel less overwhelmed by her loss.  Goals are to see a 50% reduction in grief symptoms over the next 6 months.  I will encourage the use of the patient telling his grief story about his daughter including encouraging him to bring pictures and memorabilia to help process his feelings including any feelings of guilt associated w ith her loss.  We will use cognitive behavioral therapy to help identify and change anxiety producing thoughts and behavior patterns so as to improve his ability to better manage anxiety and stress, and manage thoughts and worrisome thinking contributing to anxiety.  We will also refresh and encourage dialectical behavior therapy distress  tolerance and mindfulness  skills with the intention of reducing anxiety by 50% over the next 6 mont hs. Progress: 30% Interventions: Cognitive Behavioral Therapy  Diagnosis:PTSD, Bi-Polar D/O  Plan: F/U/ appointments scheduled weekly.  Cecile Coder, [facility]                                                                                                                   Cecile Coder, [facility]                Cecile Coder, [facility]               Cecile Coder, [facility]  [facility] Counselor/Therapis t Progress Note  Patient ID: Eron Staat, MRN: [id number],    Date: 04/01/2024 This session was held  via video teletherapy. The patient consented to the video teletherapy and was located in his home during this session. He is aware it is the responsibility of the patient to secure confidentiality on his end of the session. The provider was in a private home office for the duration of this session.   The patient arrived on ti me for her Caregility session  Time Spent: 57 minutes, 2 PM to 2:57 PM  Treatment Type: Individual Therapy Reported Symptoms: anxiety, panic attacks The patient is still having significant back pain.  He cannot sit anywhere for very long and has to be careful where he sits.  There is still some unsteadiness although he did not report any falls over the past 2 weeks.  His biggest concern now is for his wife.  She has been having som e difficulty breathing and they did a test so they found a small hole on the back side of her heart.  She is meeting with her cardiologist again next week to see what possible treatment is for her.  He still thinks there may be some issue with her diaphragm but she is meeting with the pulmonologist also.  He is doing what he can to help her.  His daughter's birthday is March 11 so he knows that is a difficult time for both he and his  wife next week.  Typically they do something to remember her but because of the wife's current health issues they cannot go to [place] as they had planned so they are talking about what to do to remember and celebrate her birthday.  We will continue to work on coping skills for helping with his anxiety and the progression of his Lewy body dementia diagnosis as well as processing his grief. Mental Status Exam: Appearance:  Well  Groomed  Behavior: Appropriate Motor: Pt. Reports some instability due to Lewey Body dementia Speech/Language:  Normal Rate Affect: Appropriate Mood: normal Thought process: normal Thought content:  WNL Sensory/Perceptual disturbances:  WNL Orientation: oriented to person, place,  and situation Attention: Good Concentration: Good Memory: Impaired short term Fund of knowledge:  Good Insight:  Good Judgment:  Good Imp ulse Control: Good  Risk Assessment: Danger to Self: No Self-injurious Behavior: No Danger to Others: No Duty to Warn:no Physical Aggression / Violence:No  Access to Firearms a concern: No  Gang Involvement:No  Treatment plan: Will employ cognitive behavioral therapy as well as grief and loss therapy for relief of anxiety and grief.  Goals of grief therapy or to have a healthier response to the loss of his daughter and less s adness as evidenced by patient report in therapy notes as well as to help him feel less overwhelmed by her loss.  Goals are to see a 50% reduction in grief symptoms over the next 6 months.  I will encourage the use of the patient telling his grief story about his daughter including encouraging him to bring pictures and memorabilia to help process his feelings including any feelings of guilt associated with her loss.  We will use cogniti ve behavioral therapy to help identify and change anxiety producing thoughts and behavior patterns so as to improve his ability to better manage anxiety and stress, and manage thoughts and worrisome thinking contributing to anxiety.  We will also refresh and encourage dialectical behavior therapy distress tolerance and mindfulness  skills with the intention of reducing anxiety by 50% over the next 6 months. Progress: 30% Interventions:  Cognitive Behavioral Therapy  Diagnosis:PTSD, Bi-Polar D/O  Plan: F/U/ appointments scheduled weekly.  Cecile Coder, [facility]                                                                                                                   Cecile Coder, [facility]  [facility] Counselor/Therapist Progress Note  Patient ID: Abir Craine, MRN: [id number],    Date: 04/01/2024 This session was held via video teleth erapy. The patient consented to the video teletherapy and was located in his home during this session. He is aware it is the responsibility of the patient to secure confidentiality on his end of the session. The provider was in a private home office for the duration of this session.   The patient arrived on time for her Caregility session  Time Spent: 57 minutes, 2 PM to 2:57 PM  Treatment Type: Individual Therapy Reported Sympto ms: anxiety, panic attacks That was a situation earlier in the week when the patient's neighbor was banging on his back door and window.  There had been situations where the same  neighbor had said things and use profanity directed at his wife feeling that the patient and his wife and let their dog use the bathroom in his yard.  The patient said they are very careful because her dog is old and did not think that happen but did not appre ciate how they were handled things.  He said he got angry very quickly and did not handle it the way that he wanted to.  The situation has been resolved but the patient has had very clear boundaries with his neighbor. His wife continues to progress well from her surgery.  Physically the patient is doing fairly well.  He had to see one of his brothers this week.  He is spending time with his son and son's family.  He has gotten some ret irement/insurance things works out for the most part and is glad to have that settled. We will continue to work on coping skills for helping with his anxiety and the progression of his Lewy body dementia diagnosis as well as processing his grief. 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 Speech/Language:  Normal Rate Affect: Appropriate M ood: normal Thought process: normal Thought content:  WNL Sensory/Perceptual disturbances:  WNL Orientation: oriented to person, place, and situation Attention: Good Concentration: Good Memory: Impaired short term Fund of knowledge:  Good Insight:  Good Judgment:  Good Impulse Control: Good  Risk Assessment: Danger to Self: No Self-injurious Behavior: No Danger to Others: No Duty to Warn:no Physical Aggression / Vi olence:No  Access to Firearms a concern: No  Gang Involvement:No  Treatment plan: Will employ cognitive behavioral therapy as well as grief and loss therapy for relief of anxiety and grief.  Goals of grief therapy or to have a healthier response to the loss of his daughter and less sadness as evidenced by patient report in therapy notes as well as to help him  feel less overwhelmed by her loss.  Goals are to see a 50% reduction in gri ef symptoms over the next 6 months.  I will encourage the use of the patient telling his grief story about his daughter including encouraging him to bring pictures and memorabilia to help process his feelings including any feelings of guilt associated with her loss.  We will use cognitive behavioral therapy to help identify and change anxiety producing thoughts and behavior patterns so as to improve his ability to better manage anxiety  and stress, and manage thoughts and worrisome thinking contributing to anxiety.  We will also refresh and encourage dialectical behavior therapy distress tolerance and mindfulness skills with the intention of reducing anxiety by 50% over  the next 6 months. Progress: 30% Interventions: Cognitive Behavioral Therapy  Diagnosis:PTSD, Bi-Polar D/O  Plan: F/U/ appointments scheduled weekly.  Cecile Coder, [facility]                                                                                                                    Cecile Coder, [facility]               Cecile Coder, [facility]               Cecile Coder, [facility]               Cecile Coder, [facility]               Cecile Coder, [facility]                Cecile Coder, [facility]               Cecile Coder, [facility]               Cecile Coder, [facility]               Cecile Coder, [facility]               Cecile Coder, [facility]  [facility] Counselor/Therapist Progress Note  Patient ID: Eloise Mula, MRN: [id number],    Date: 04/01/2024 This session was held via video teletherapy. The patient consented to the video teletherapy and was located in his home during this session. He is aware it is the responsibility of t he patient to secure confidentiality on his end of the session. The provider was in a private home office for the duration of this session.   The patient arrived on time for her Caregility session  Time Spent: 59 minutes, 2 PM to 2:59 PM  Treatment Type: Individual Therapy Reported Symptoms: anxiety, panic attacks The patient said he jokingly said to his wife that he had not had any pain issues in the cervical area and then a fe w days ago he started having pain in his neck and shoulders.  He now sits related to degenerative disk and has some consistent pain somewhere in his spine with that but jokingly says it  just depends on Wednesday which part of his body is decides to show up and hurt.  He is doing the best that he can.  He is still walking which he knows is good for him but does not have right now the strength to do as much in the morning as he has done i n the past so he is denying that to 3 times a day for consistency.  He is still doing some positive things and that he is staying in touch with his brother and other family members.  He is reconnecting with friends from high school and he is which she is enjoying.  One of them has had a similar experience in that she lost her son in the same way the patient lost his daughter.  He said they did not think and Easter in which they set a pl ace for his daughter at the table and put her picture there.  He initially had reservations about doing that he and his wife had made everyone feel as if she was really there with him.  He still feels that he is grieving in a healthy way.  He and his wife have always taken a trip around both of their birthdays which are 2 days apart.  That is now close to his daughter died so they have started taking some of her rashes with him and spri nkling them where they go as a way of remembrance.  They are deciding what that looks like this year in July.  He does contract for safety having no thoughts of hurting himself or anyone else. We will continue to work on coping skills for helping with his anxiety and the progression of his Lewy body dementia diagnosis as well as processing his grief. Mental Status Exam: Appearance:  Well Groomed  Behavior: Appropriate Motor: Pt . Reports some instability due to Lewey Body dementia Speech/Language:  Normal Rate Affect: Appropriate Mood: normal Thought process: normal Thought content:  WNL Sensory/Perceptual disturbances:  WNL Orientation: oriented to person, place, and situation Attention: Good Concentration: Good Memory: Impaired short term Fund of knowledge:  Good Insight:   Good Judgment:  Good Impulse Control: Good  Risk Assessment: Dange r to Self: No Self-injurious Behavior: No Danger to Others: No Duty to Warn:no Physical Aggression / Violence:No  Access to Firearms a concern: No  Gang Involvement:No  Treatment plan: Will employ cognitive behavioral therapy as well as grief and loss therapy for relief of anxiety and grief.  Goals of grief therapy  or to have a healthier response to the loss of his daughter and less sadness as evidenced by patient report in ther apy notes as well as to help him feel less overwhelmed by her loss.  Goals are to see a 50% reduction in grief symptoms over the next 6 months.  I will encourage the use of the patient telling his grief story about his daughter including encouraging him to bring pictures and memorabilia to help process his feelings including any feelings of guilt associated with her loss.  We will use cognitive behavioral therapy to help identify and ch ange anxiety producing thoughts and behavior patterns so as to improve his ability to better manage anxiety and stress, and manage thoughts and worrisome thinking contributing to anxiety.  We will also refresh and encourage dialectical behavior therapy distress tolerance and mindfulness skills with the intention of reducing anxiety by 50% over the next 6 months. Progress: 30% Interventions: Cognitive Behavioral Therapy  Diagnosis:PT SD, Bi-Polar D/O  Plan: F/U/ appointments scheduled weekly.  Cecile Coder, [facility]                                                                                                                   Cecile Coder, [facility]  [facility] [facility] Counselor/Therapist Progress Note  Patient ID: Lonzo Saulter, MRN: [id number],    Date: 04/01/2024 This session was held via video teletherapy. The patient consented to the video tel etherapy and was located in his home during this session. He is aware it is the responsibility of the patient to secure confidentiality on his end of the session. The provider was in a private home office for the duration of this session.   The patient arrived on time for her Caregility session  Time Spent: 58 minutes, 2 PM to 2:58 PM  Treatment Type: Individual Therapy Reported Symptoms: anxiety, panic attacks A fairly difficul t week because it was his daughter's birthday.  He said this year was more difficult than last year but he could not pinpoint why.  They did remember her about going out to a nice  restaurant which is something her daughter always wanted to do.  He said they were able to laugh and joke about funny things that his daughter had said or done which is not something they were able to do this time last year so he saw that as healthy grieving.   He knows that the time change threw things off a little bit for everybody and his wife still is not feeling well and his back has been bothering him.  He was not able to go to his first round of physical therapy this week because his back was bothering him more.  He has been thinking a lot more about his daughter which he feels comfortable with.  He is thankful that it is getting warmer and staying light longer so he can get back to so me of the coping skills such as fishing that he has done in the past.   We will continue to work on coping skills for helping with his anxiety and the progression of his Lewy body dementia diagnosis as well as processing his grief. 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 Speech/Language:  Normal Rate Affect: Appropriate Mood: normal  Thought process: normal Thought content:  WNL Sensory/Perceptual disturbances:  WNL Orientation: oriented to person, place, and situation Attention: Good Concentration: Good Memory: Impaired short term Fund of knowledge:  Good Insight:  Good Judgment:  Good Impulse Control: Good  Risk Assessment: Danger to Self: No Self-injurious Behavior: No Danger to Others: No Duty to Warn:no Physical Aggression / Violence:No  A ccess to Firearms a concern: No  Gang Involvement:No  Treatment plan: Will employ cognitive behavioral therapy as well as grief and loss therapy for relief of anxiety and grief.  Goals of grief therapy or to have a healthier response to the loss of his daughter and less sadness as evidenced by patient report in therapy notes as well as to help him feel less  overwhelmed by her loss.  Goals are to see a 50% reduction in grief symptoms o ver the next 6 months.  I will encourage the use of the patient telling his grief story about his daughter including encouraging him to bring pictures and memorabilia to help process his feelings including any feelings of guilt associated with her loss.  We will use cognitive behavioral therapy to help identify and change anxiety producing thoughts and behavior patterns so as to improve his ability to better manage anxiety and stress, a nd manage thoughts and worrisome thinking contributing to anxiety.  We will also refresh and encourage dialectical behavior therapy  distress tolerance and mindfulness skills with the intention of reducing anxiety by 50% over the next 6 months. Progress: 30% Interventions: Cognitive Behavioral Therapy  Diagnosis:PTSD, Bi-Polar D/O  Plan: F/U/ appointments scheduled weekly.  Cecile Coder, [facility]                                                                                                                    Cecile Coder, [facility]               Cecile Coder, [facility]               Cecile Coder, [facility]  [facility] Counselor/Therapist Progress Note  Patient ID: Makyle Eslick, MRN: [id number],    Date: 04/01/2024 This session was held via video teletherapy. The patient consented to the video teletherapy and was located in his home duri ng this session. He is aware it is the responsibility of the patient to secure confidentiality on his end of the session. The provider was in a private home office for the duration of this session.   The patient arrived on time for her Caregility session  Time Spent: 1:05 PM until 2 PM, 55 minutes Treatment Type: Individual Therapy Reported Symptoms: anxiety, panic attacks The patient said he came in from his walk this morning a nd saw what he thought was some sort of tic from his wife.  It was not the first time it happened and he wonders if it might be tardive dyskinesia because of some new medications that she is on.  She has a call into the doctor right now and hopes to hear something back soon.  They have narrowed down to the fact they think most of her discomfort in breathing is coming from the diaphragm and they are looking for alternatives for treatment .  The patient continues to do those things which help him cope including walking, fishing when he can, spending time with his son and daughter-in-law and grandson.  He also has a way  of continued coping with grief he is writing letters to his daughters as a way of expressing his feelings and says that is very beneficial.  He appears to be grieving in a healthy way but he knows that he to year anniversary of her death is coming up and  there is some grief there but says it is not overwhelming.  We will continue to work on coping skills for helping with his anxiety and the progression of his Lewy body dementia diagnosis as well as processing his grief. 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 Speech/Language:  Normal Rate Affect: Appropriate Mood: normal Thought proc ess: normal Thought content:  WNL Sensory/Perceptual disturbances:  WNL Orientation: oriented to person, place, and situation Attention: Good Concentration: Good Memory: Impaired short term Fund of knowledge:  Good Insight:  Good Judgment:  Good Impulse Control: Good  Risk Assessment: Danger to Self: No Self-injurious Behavior: No Danger to Others: No Duty to Warn:no Physical Aggression / Violence:No  Access to [employer] a concern: No  Gang Involvement:No  Treatment plan: Will employ cognitive behavioral therapy as well as grief and loss therapy for relief of anxiety and grief.  Goals of grief therapy or to have a healthier response to the loss of his daughter and less sadness as evidenced by patient report in therapy notes as well as to help him feel less overwhelmed by her loss.  Goals are to see a 50% reduction in grief symptoms over the next  6 months.  I will encourage the use of the patient telling his grief story about his daughter including encouraging him to bring pictures and memorabilia to help process his feelings including any feelings of guilt associated with her loss.  We will use cognitive behavioral therapy to help identify and change anxiety producing thoughts and behavior patterns so as to  improve his ability to better manage anxiety and stress, and manage th oughts and worrisome thinking contributing to anxiety.  We will also refresh and encourage dialectical behavior therapy distress tolerance  and mindfulness skills with the intention of reducing anxiety by 50% over the next 6 months. Progress: 30% Interventions: Cognitive Behavioral Therapy  Diagnosis:PTSD, Bi-Polar D/O  Plan: F/U/ appointments scheduled weekly.  Cecile Coder, [facility]                                                                                                                    Cecile Coder, [facility]  [facility] Counselor/Therapist Progress Note  Patient ID: Ladarious Kresse, MRN: [id number],    Date: 04/01/2024 This session was held via video teletherapy. The patient consented to the video teletherapy and was located in his home during this session. He is aware it is the responsibility of the patient to secure confidentiali ty on his end of the session. The provider was in a private home office for the duration of this session.   The patient arrived on time for her Caregility session  Time Spent: 59 minutes, 2 PM to 2:59 PM  Treatment Type: Individual Therapy Reported Symptoms: anxiety, panic attacks The patient continues to present with physical pain and both his back and his knee.  He started with a new physical therapist and told him that there  was a lot of arthritis in both knees and or certain things he cannot do but they had him do that anyway and now for the past 3 days he has been in significant knee pain.  He has continued to walk because he knows that is good for him in various ways but says it has been painful.  His wife also was not feeling well but she is going back to the doctor next week to have an ablation and hopes that will bring her some relief.  For the most p art he has been at the last couple weeks reaching back out and hearing from some old friends as well as time with his son and son's family.  He says that his wife's cousin is coming down for a weekend soon and they enjoy time with her and that he will go to his brother's house sometime in the next few weeks.  He recognizes the need for people that he cares about in helping with his depression. We will continue to work on coping skills  for helping with his anxiety and the progression of his Lewy body dementia diagnosis as well as processing his grief. 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 Speech/Language:  Normal  Rate Affect: Appropriate Mood: normal Thought process: normal Thought content:  WNL Sensory/Perceptual disturbances:  WNL Orientation: oriented to per son, place, and situation Attention: Good Concentration: Good Memory: Impaired short term Fund of knowledge:  Good Insight:  Good Judgment:  Good Impulse Control: Good  Risk Assessment: Danger to Self: No Self-injurious Behavior: No Danger to Others: No Duty to Warn:no Physical Aggression / Violence:No  Access to Firearms a concern: No  Gang Involvement:No  Treatment plan: Will employ cognitive behavioral therapy as w ell as grief and loss therapy for relief of anxiety and grief.  Goals of grief therapy or to have a healthier response to the loss of his daughter and less sadness as evidenced by patient report in therapy notes as well as to help him feel less overwhelmed by her loss.  Goals are to see a 50% reduction in grief symptoms over the next 6 months.  I will encourage the use of the patient telling his grief story about his daughter including  encouraging him to bring pictures and memorabilia to help process his feelings including any feelings of guilt associated with her loss.  We will use cognitive behavioral therapy to help identify and change anxiety producing thoughts and behavior patterns so as to improve his ability to better manage anxiety and stress, and manage thoughts and worrisome thinking contributing to anxiety.  We will also  refresh and encourage dialectical be havior therapy distress tolerance and mindfulness skills with the intention of reducing anxiety by 50% over the next 6 months. Progress: 30% Interventions: Cognitive Behavioral Therapy  Diagnosis:PTSD, Bi-Polar D/O  Plan: F/U/ appointments scheduled weekly.  Cecile Coder, [facility]                                                                                                                     Cecile Coder, [facility]               Cecile Coder, [facility]               Cecile Coder, [facility]               Cecile Coder, [facility]  [facility] Counselor/Therapist Progress Note  Patient ID: Vang Kraeger, MRN: [id number],    Date: 04/01/2024 This session was held via video teletherapy. The patient consented to the video teletherapy and was located in his home during this session. He is aware it is the responsibility  of the patient to secure confidentiality on his end of the session. The provider was in a private home office for the duration of this session.   The patient arrived on time for her  Caregility session  Time Spent: 57 minutes, 2 PM to 2:57 PM  Treatment Type: Individual Therapy Reported Symptoms: anxiety, panic attacks The patient is still having significant back pain.  He cannot sit anywhere for very long and has to be careful w here he sits.  There is still some unsteadiness although he did not report any falls over the past 2 weeks.  His biggest concern now is for his wife.  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  She is meeting with her cardiologist again next week to see what possible treatment is for her.  He still thinks there may be some issue with her diaphragm but she  is meeting with the pulmonologist also.  He is doing what he can to help her.  His daughter's birthday is March 11 so he knows that is a difficult time for both he and his wife next week.  Typically they do something to remember her but because of the wife's current health issues they cannot go to [place] as they had planned so they are talking about what to do to remember and celebrate her birthday.  We will continue to work  on coping skills for helping with his anxiety and the progression of his Lewy body dementia diagnosis as well as processing his grief. 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 Speech/Language:  Normal Rate Affect: Appropriate Mood: normal Thought process: normal Thought content:  WNL Sensory/Perceptual disturbances:  WNL Orientation : oriented to person, place, and situation Attention: Good Concentration: Good Memory: Impaired short term Fund of knowledge:  Good Insight:  Good Judgment:  Good Impulse Control: Good  Risk Assessment: Danger to Self: No Self-injurious Behavior: No Danger to Others: No Duty to Warn:no Physical Aggression / Violence:No  Access to Firearms a concern:  No  Gang Involvement:No  Treatment plan: Will employ cognitive behavi oral therapy as well as grief and loss therapy for relief of anxiety and grief.  Goals of grief therapy or to have a healthier response to the loss of his daughter and less sadness as evidenced by patient report in therapy notes as well as to help him feel less overwhelmed by her loss.  Goals are to see a 50% reduction in grief symptoms over the next 6 months.  I will encourage the use of the patient telling his grief story about his da ughter including encouraging him to bring pictures and memorabilia to help process his feelings including any feelings of guilt associated with her loss.  We will use cognitive behavioral therapy to help identify and change anxiety producing thoughts and behavior patterns so as to improve his ability to better manage anxiety and stress, and manage thoughts and worrisome thinking contributing to anxiety.  We will also refresh and encoura ge dialectical behavior therapy distress tolerance and mindfulness  skills with the intention of reducing anxiety by 50% over the next 6 months. Progress: 30% Interventions: Cognitive Behavioral Therapy  Diagnosis:PTSD, Bi-Polar D/O  Plan: F/U/ appointments scheduled weekly.  Cecile Coder, [facility]                                                                                                                    Cecile Coder, [facility]   Behavioral Health Counselor/Therapist Progress Note  Patient ID: Dezman Granda, MRN: [id number],    Date: 04/01/2024 This session was held via video teletherapy. The patient consented to the video teletherapy and was located in his home during this session. He is aware it is the responsibility of the patient to secure confidentiality on his end of the session. The provider was in a private home office for the duration  of this session.   The patient arrived on time for her Caregility session  Time Spent: 58 minutes, 2 PM to 2:58 PM  Treatment Type: Individual Therapy Reported Symptoms: anxiety, panic attacks A fairly difficult week because it was his daughter's birthday.  He said this year was more difficult than last year but he could not pinpoint why.  They did remember her about going out to a nice restaurant which is something her daughte r always wanted to do.  He said they were able to laugh and joke about funny things that his daughter had said or done which is not something they were able to do this time last year so  he saw that as healthy grieving.  He knows that the time change threw things off a little bit for everybody and his wife still is not feeling well and his back has been bothering him.  He was not able to go to his first round of physical therapy this wee k because his back was bothering him more.  He has been thinking a lot more about his daughter which he feels comfortable with.  He is thankful that it is getting warmer and staying light longer so he can get back to some of the coping skills such as fishing that he has done in the past.   We will continue to work on coping skills for helping with his anxiety and the progression of his Lewy body dementia diagnosis as well as processi ng his grief. 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 Speech/Language:  Normal Rate Affect: Appropriate Mood: normal Thought process: normal Thought content:  WNL Sensory/Perceptual disturbances:  WNL Orientation: oriented to person, place, and situation Attention: Good Concentration: Good Memory: Impaired short term Fund of kn owledge:  Good Insight:  Good Judgment:  Good Impulse Control: Good  Risk Assessment: Danger to Self: No Self-injurious Behavior: No Danger to Others: No Duty to Warn:no Physical Aggression / Violence:No  Access to Firearms a concern: No  Gang Involvement:No  Treatment plan: Will employ cognitive behavioral therapy as well as grief and loss therapy for relief of anxiety and grief.  Goals of grief therapy or to have a heal thier response to the loss of his daughter and less sadness as evidenced by patient report in therapy notes as well as to help him feel less overwhelmed by her loss.  Goals are to see a 50% reduction in grief symptoms over the next 6 months.  I will encourage the use of the patient telling his grief story about his daughter including encouraging him to bring pictures  and memorabilia to help process his feelings including any feelings of  guilt associated with her loss.  We will use cognitive behavioral therapy to help identify and change anxiety producing thoughts and behavior patterns so as to improve his ability to better manage anxiety and stress, and manage thoughts and worrisome thinking contributing to anxiety.  We will also refresh and encourage dialectical behavior therapy distress  tolerance and mindfulness skills with the intention of reducing anxiety by 50% o ver the next 6 months. Progress: 30% Interventions: Cognitive Behavioral Therapy  Diagnosis:PTSD, Bi-Polar D/O  Plan: F/U/ appointments scheduled weekly.  Cecile Coder, [facility]                                                                                                                   Cecile Coder, [facility]                Cecile Coder, [facility]               Cecile Coder, [facility]  [facility]  Counselor/Therapist Progress Note  Patient ID: Anyelo Mccue, MRN: [id number],    Date: 04/01/2024 This session was held via video teletherapy. The patient consented to the video teletherapy and was located in his home during this session. He is aware it is the responsibility of the patient to secure confidentiality on his end of the session. The provider was in a private home office for the duration of this session.   The pa tient arrived on time for her Caregility session  Time Spent: 57 minutes, 2 PM to 2:57 PM  Treatment Type: Individual Therapy Reported Symptoms: anxiety, panic attacks The patient is still having significant back pain.  He cannot sit anywhere for very long and has to be careful where he sits.  There is still some unsteadiness although he did not report any falls over the past 2 weeks.  His biggest concern now is for his wife.  She  has been having some difficulty breathing and they did a test so they found a small hole on the back side of her heart.  She is meeting with her cardiologist again next week to see what possible treatment is for her.  He still thinks there may be some issue with her diaphragm but she is meeting with the pulmonologist also.  He is doing what he can to help her.  His daughter's birthday is March 11 so he knows that is a difficult time f or both he and his wife next week.  Typically they do something to remember her but because of the wife's current health issues they cannot go to [place] as they had planned so  they are talking about what to do to remember and celebrate her birthday.  We will continue to work on coping skills for helping with his anxiety and the progression of his Lewy body dementia diagnosis as well as processing his grief. Mental Status Exam:  Appearance:  Well Groomed  Behavior: Appropriate Motor: Pt. Reports some instability due to Lewey Body dementia Speech/Language:  Normal Rate Affect: Appropriate Mood: normal Thought process: normal Thought content:  WNL Sensory/Perceptual disturbances:  WNL Orientation: oriented to person, place, and situation Attention: Good Concentration: Good Memory: Impaired short term Fund of knowledge:  Good Insight:  Good J udgment:  Good Impulse Control: Good  Risk Assessment: Danger to Self: No Self-injurious Behavior: No Danger to Others: No Duty to Warn:no Physical Aggression / Violence:No  Access to Firearms a concern: No  Gang Involvement:No  Treatment plan: Will employ cognitive behavioral therapy as well as grief and loss therapy for relief of anxiety and grief.  Goals of grief therapy or to have a healthier response to the loss of his  daughter and less sadness as evidenced by patient report in therapy notes as well as to help him feel less overwhelmed by her loss.  Goals are to see a 50% reduction in grief symptoms over the next 6 months.  I will encourage the use of the patient telling his grief story about his daughter including encouraging him to bring pictures and memorabilia to help process his feelings including any feelings of guilt associated with her loss.   We will use cognitive behavioral therapy to help identify and change anxiety producing thoughts and behavior patterns so as to improve his ability to better manage anxiety and stress, and manage thoughts and worrisome thinking contributing to anxiety.  We will also refresh and encourage dialectical behavior therapy distress tolerance and mindfulness skills  with the  intention of reducing anxiety by 50% over the next 6 months. Progress:  30% Interventions: Cognitive Behavioral Therapy  Diagnosis:PTSD, Bi-Polar D/O  Plan: F/U/ appointments scheduled weekly.  Cecile Coder, [facility]                                                                                                                   Cecile Coder, [facility]   Behavioral Health Counselor/Therapist Progress Note  Patient ID: Keino Placencia, MRN: [id number],    Date: 04/01/2024 This session was he ld via video teletherapy. The patient consented to the video teletherapy and was located in his home during this  session. He is aware it is the responsibility of the patient to secure confidentiality on his end of the session. The provider was in a private home office for the duration of this session.   The patient arrived on time for her Caregility session  Time Spent: 57 minutes, 2 PM to 2:57 PM  Treatment Type: Individual Thera py Reported Symptoms: anxiety, panic attacks  We will continue to work on coping skills for helping with his anxiety and the progression of his Lewy body dementia diagnosis as well as processing his grief. 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 Speech/Language:  Normal Rate Affect: Appropriate Mood: normal Thought process: normal T hought content:  WNL Sensory/Perceptual disturbances:  WNL Orientation: oriented to person, place, and situation Attention: Good Concentration: Good Memory: Impaired short term Fund of knowledge:  Good Insight:  Good Judgment:  Good Impulse Control: Good  Risk Assessment: Danger to Self: No Self-injurious Behavior: No Danger to Others: No Duty to Warn:no Physical Aggression / Violence:No  Access to Firearms a concer n: No  Gang Involvement:No  Treatment plan: Will employ cognitive behavioral therapy as well as grief and loss therapy for relief of anxiety and grief.  Goals of grief therapy or to have a healthier response to the loss of his daughter and less sadness as evidenced by patient report in therapy notes as well as to help him feel less overwhelmed by her loss.  Goals are to see a 50% reduction in grief symptoms over the next 6 months.  I  will encourage the use of the patient telling his grief story about his daughter including encouraging him to bring pictures and memorabilia to help process his feelings including any feelings of guilt associated with her loss.  We will use cognitive behavioral therapy to help identify and change  anxiety producing thoughts and behavior patterns so as to improve his ability to better manage anxiety and stress, and manage thoughts and wor risome thinking contributing to anxiety.  We will also refresh and encourage dialectical behavior therapy distress tolerance and mindfulness skills with the intention of reducing anxiety by 50% over the next 6 months. Progress: 30% Interventions: Cognitive Behavioral Therapy  Diagnosis:PTSD, Bi-Polar D/O  Plan: F/U/ appointments scheduled weekly.  Cecile Coder, [facility]                                                                                                                    Cecile Coder, [facility]               Cecile Coder, [facility]               Cecile Coder, [facility]  Cecile Coder, [facility]               Cecile Coder, [facility]               Cecile Coder,  [facility]               Cecile Coder, [facility]  [facility] Counselor/Therapist Progress Note  Pat ient ID: Paolo Okane, MRN: [id number],    Date: 04/01/2024 This session was held via video teletherapy. The patient consented to the video teletherapy and was located in his home during this session. He is aware it is the responsibility of the patient to secure confidentiality on his end of the session. The provider was in a private home office for the duration of this session.   The patient arrived on time for her Caregility  session  Time Spent: 39 minutes, 2 PM to 2:39 PM  Treatment Type: Individual Therapy Reported Symptoms: anxiety, panic attacks The patient was not feeling well today.  He started feeling really earlier in the week having a fever bad headaches body aches.  He felt a little better yesterday but feels a little bit of a resurgence in headaches and body aches and is running a low-grade fever.  His wife is feeling some of the same sympto ms.  For the most part he says he is doing well.  They did increase his memory medication because they were starting to see some increasing decline in short-term memory and he said until recently his long-term memory had been good but he was starting to see a difference in how he used to markers to remember what happened at certain times historically.  He feels the medication has helped and has brightened his mood a little also.  He is  still trying to walk every day and fish when he can and has enjoyed watching the [employer] as a good distraction.  He reports that he feels he is managing mood fairly  well.  He has an appointment in 2 weeks.  He does contract for safety having no thoughts of hurting himself or anyone else. We will continue to work on coping skills for helping with his anxiety and the progression of his Lewy body dementia diagnosis as we ll as processing his grief. 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 Speech/Language:  Normal Rate Affect: Appropriate Mood: normal Thought process: normal Thought content:  WNL Sensory/Perceptual disturbances:  WNL Orientation: oriented to person, place, and situation Attention: Good Concentration: Good Memory: Impaired short te rm Fund of knowledge:  Good Insight:  Good Judgment:  Good Impulse Control: Good  Risk Assessment: Danger to Self: No Self-injurious Behavior: No Danger to Others: No Duty to Warn:no Physical Aggression / Violence:No  Access to Firearms a concern: No  Gang Involvement:No  Treatment plan: Will employ cognitive behavioral therapy as well as grief and loss therapy for relief of anxiety and grief.  Goals of grief therapy or  to have a healthier response to the loss of his daughter and less sadness as evidenced by patient report in therapy notes as well as to help him feel less overwhelmed by her loss.  Goals are to see a 50% reduction in grief symptoms over the next 6 months.  I will encourage the use of the patient telling his grief story about his daughter including encouraging him to bring pictures and memorabilia to help process his feelings including a ny feelings of guilt associated with her loss.  We will use cognitive behavioral therapy to help identify and change anxiety producing thoughts and behavior patterns so as to improve his ability to better manage anxiety and stress, and manage thoughts and worrisome thinking contributing to anxiety.  We will also refresh and  encourage dialectical behavior therapy  distress tolerance and mindfulness skills with the intention of reducing an xiety by 50% over the next 6 months. Progress: 30% Interventions: Cognitive Behavioral Therapy  Diagnosis:PTSD, Bi-Polar D/O  Plan: F/U/ appointments scheduled weekly.  Cecile Coder, [facility]                                                                                                                   [address], [facility]  [facility] Counselor/Therapist Progress Note  Patient ID: Antoney Biven, MRN: [id number],    Date: 04/01/2024 This session was held via video teletherapy. The patient consented to the video  teletherapy and was located in his home during this session. He is aware it is the responsibility of the patient to secure confidentiality on his end of the session. The provider was in a private home office for the duration of this session.   The patient arrived on time for her Caregility session  Time Spent: 58 minutes, 2  PM to 2:58 PM  Treatment Type: Individual Therapy Reported Symptoms: anxiety, panic attacks A fairly difficult week because it was his daughter's birthday.  He said this year was more difficult than last year but he could not pinpoint why.  They did remember her about going out to a nice restaurant which is something her daughter always wanted to do.  He said they were able to laugh and joke about funny things that his daughter had s aid or done which is not something they were able to do this time last year so he saw that as healthy grieving.  He knows that the time change threw things off a little bit for everybody and his wife still is not feeling well and his back has been bothering him.  He was not able to go to his first round of physical therapy this week because his back was bothering him more.  He has been thinking a lot more about his daughter which he fee ls comfortable with.  He is thankful that it is getting warmer and staying light longer so he can get back to some of the coping skills such as fishing that he has done in the past.   We will continue to work on coping skills for helping with his anxiety and the progression of his Lewy body dementia diagnosis as well as processing his grief. Mental Status Exam: Appearance:  Well Groomed  Behavior: Appropriate Motor: Pt. Reports  some instability due to Lewey Body dementia Speech/Language:  Normal Rate Affect: Appropriate Mood: normal Thought process: normal Thought content:  WNL Sensory/Perceptual disturbances:  WNL Orientation: oriented to person, place, and  situation Attention: Good Concentration: Good Memory: Impaired short term Fund of knowledge:  Good Insight:  Good Judgment:  Good Impulse Control: Good  Risk Assessment: Danger to Self : No Self-injurious Behavior: No Danger to Others: No Duty to Warn:no Physical Aggression / Violence:No  Access to Firearms a concern: No  Gang Involvement:No  Treatment plan: Will employ cognitive behavioral therapy as well as grief and loss therapy for relief of anxiety and grief.  Goals of grief therapy or to have a healthier response to the loss of his daughter and less sadness as evidenced by patient report in therapy notes  as well as to help him feel less overwhelmed by her loss.  Goals are to see a 50% reduction in grief symptoms over the next 6 months.  I will encourage the use of the patient telling his grief story about his daughter including encouraging him to bring pictures and memorabilia to help process his feelings including any feelings of guilt associated with her loss.  We will use cognitive behavioral therapy to help identify and change anxi ety producing thoughts and behavior patterns so as to improve his ability to better manage anxiety and stress, and manage thoughts and worrisome thinking contributing to anxiety.  We will also refresh and encourage dialectical behavior therapy  distress tolerance and mindfulness skills with the intention of reducing anxiety by 50% over the next 6 months. Progress: 30% Interventions: Cognitive Behavioral Therapy  Diagnosis:PTSD, Bi-Po lar D/O  Plan: F/U/ appointments scheduled weekly.  Cecile Coder, [facility]                                                                                                                   Cecile Coder, [facility]               Cecile Coder, [facility]               Cecile Coder,  [facility]  [facility] [facility] Counselor/Therapist Progress Note  Patient ID: Derrall Hicks, MRN: [id number],    Date: 04/01/2024 This session  was held via video teletherapy. The patient consented to the video teletherapy and was located in his home during this session. He is aware it is the responsibility of the patient to secure confidentiality on his end of the session. The provider was in a private home office for the duration of this session.   The patient arrived on time for her Caregility session  Time Spent: 1:05 PM until 2 PM, 55 minutes Treatment Type: Individ ual Therapy Reported Symptoms: anxiety, panic attacks The patient said he came in from his walk this morning and saw what he thought was some sort of tic from his  wife.  It was not the first time it happened and he wonders if it might be tardive dyskinesia because of some new medications that she is on.  She has a call into the doctor right now and hopes to hear something back soon.  They have narrowed down to the fact they think most  of her discomfort in breathing is coming from the diaphragm and they are looking for alternatives for treatment.  The patient continues to do those things which help him cope including walking, fishing when he can, spending time with his son and daughter-in-law and grandson.  He also has a way of continued coping with grief he is writing letters to his daughters as a way of expressing his feelings and says that is very beneficial.  H e appears to be grieving in a healthy way but he knows that he to year anniversary of her death is coming up and there is some grief there but says it is not overwhelming.  We will continue to work on coping skills for helping with his anxiety and the progression of his Lewy body dementia diagnosis as well as processing his grief. Mental Status Exam: Appearance:  Well Groomed  Behavior: Appropriate Motor: Pt. Reports some instab ility due to Lewey Body dementia Speech/Language:  Normal Rate Affect: Appropriate Mood: normal Thought process: normal Thought content:  WNL Sensory/Perceptual disturbances:  WNL Orientation: oriented to person, place, and situation Attention: Good Concentration: Good Memory: Impaired short term Fund of knowledge:  Good Insight:  Good Judgment:  Good Impulse Control: Good  Risk Assessment: Danger to Self: No Self- injurious Behavior: No Danger to Others: No Duty to Warn:no Physical Aggression / Violence:No  Access to Firearms a concern: No  Gang Involvement:No  Treatment plan: Will employ cognitive behavioral therapy as well as grief and loss therapy for relief of anxiety and grief.  Goals of grief therapy or to have a healthier response to the loss of  his daughter and less sadness as evidenced by patient report in therapy notes as well as  to help him feel less overwhelmed by her loss.  Goals are to see a 50% reduction in grief symptoms over the next 6 months.  I will encourage the use of the patient telling his grief story about his daughter including encouraging him to bring pictures and memorabilia to help process his feelings including any feelings of guilt associated with her loss.  We will use cognitive behavioral therapy to help identify and change anxiety producin g thoughts and behavior patterns so as to improve his ability to better manage anxiety and stress, and manage thoughts and worrisome thinking contributing to anxiety.  We will also refresh and encourage dialectical behavior therapy distress tolerance  and mindfulness skills with the intention of reducing anxiety by 50% over the next 6 months. Progress: 30% Interventions: Cognitive Behavioral Therapy  Diagnosis:PTSD, Bi-Polar D/O  P lan: F/U/ appointments scheduled weekly.  Cecile Coder, [facility]                                                                                                                   Cecile Coder, [facility]  [place] [facility] Counselor/Therapist Progress Note  Patient ID: Deforrest Bogle, MRN: [id number],    Date: 04/01/2024 This session was held via video teletherapy. The patient consented to the video teletherapy and was loca ted in his home during this session. He is aware it is the responsibility of the patient to secure confidentiality on his end of the session. The provider was in a private home office for the duration of this session.   The patient arrived on time for her Caregility session  Time Spent: 59 minutes, 2 PM to 2:59 PM  Treatment Type: Individual Therapy Reported Symptoms: anxiety, panic attacks The patient continues to present with  physical pain and both his back and his knee.  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  He has continued to walk because he knows that is good for him in various ways but says it has been painful.  His wife also was not feeling well but she is g oing back to the doctor next week to have an ablation and hopes that will bring her some relief.  For the most part he has been at the last couple weeks reaching back out and hearing  from some old friends as well as time with his son and son's family.  He says that his wife's cousin is coming down for a weekend soon and they enjoy time with her and that he will go to his brother's house sometime in the next few weeks.  He recognizes the  need for people that he cares about in helping with his depression. We will continue to work on coping skills for helping with his anxiety and the progression of his Lewy body dementia diagnosis as well as processing his grief. 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 Speech/Language:  Normal Rate Affect: Appropriate Mood: normal Thoug ht process: normal Thought content:  WNL Sensory/Perceptual disturbances:  WNL Orientation: oriented to person, place, and situation Attention: Good Concentration: Good Memory: Impaired short term Fund of knowledge:  Good Insight:  Good Judgment:  Good Impulse Control: Good  Risk Assessment: Danger to Self: No Self-injurious Behavior: No Danger to Others: No Duty to Warn:no Physical Aggression / Violence:No  Access  to Firearms a concern: No  Gang Involvement:No  Treatment plan: Will employ cognitive behavioral therapy as well as grief and loss therapy for relief of anxiety and grief.  Goals of grief therapy or to have a healthier response to the loss of his daughter and less sadness as evidenced by patient report in therapy notes as well as to help him feel less overwhelmed by her loss.  Goals are to see a 50% reduction in grief symptoms over t he next 6 months.  I will encourage the use of the patient telling his grief story about his daughter including encouraging him to bring pictures and memorabilia to help process his feelings including any feelings of guilt associated with her loss.  We will use cognitive behavioral therapy to help identify and change anxiety producing thoughts and behavior patterns  so as to improve his ability to better manage anxiety and stress, and ma nage thoughts and worrisome thinking contributing to anxiety.  We will  also refresh and encourage dialectical behavior therapy distress tolerance and mindfulness skills with the intention of reducing anxiety by 50% over the next 6 months. Progress: 30% Interventions: Cognitive Behavioral Therapy  Diagnosis:PTSD, Bi-Polar D/O  Plan: F/U/ appointments scheduled weekly.  Cecile Coder, [facility]                                                                                                                    Cecile Coder, [facility]               Cecile Coder, [facility]               Cecile Coder, [facility]               Cecile Coder, [facility]  [place] [facility] Counselor/Therapist Progress Note  Patient ID: Tobe Kervin, MRN: [id number],    Date: 04/01/2024 This session was held via video teletherapy. The patient consented  to the video teletherapy and was located in his home during this session. He is aware it is the responsibility of the patient to secure confidentiality on his end of the session. The provider was in a private home office for the duration of this session.   The patient arrived on time for her Caregility session  Time Spent: 57 minutes, 2 PM to 2:57 PM  Treatment Type: Individual Therapy Reported Symptoms: anxiety, panic attacks  The patient is still having significant back pain.  He cannot sit anywhere for very long and has to be careful where he sits.  There is still some unsteadiness although he did not report any falls over the past 2 weeks.  His biggest concern now is for his wife.  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  She is meeting with her cardiologist again next week  to see what possible treatment is for her.  He still thinks there may be some issue with her diaphragm but she is meeting with the pulmonologist also.  He is doing what he can to help her.  His daughter's birthday is March 11 so he knows that is a difficult time for both he and his wife next week.  Typically they do something to remember her but because of the wife's current health issues they cannot go to [place] as they had p lanned so they are talking about what to do to remember and celebrate her birthday.  We will continue to work on coping skills for helping with his anxiety and the progression of his  Lewy body dementia diagnosis as well as processing his grief. 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 Speech/Language:  Normal Rate Affect: Appropriate Mo od: normal Thought process: normal Thought content:  WNL Sensory/Perceptual disturbances:  WNL Orientation: oriented to person, place, and situation Attention: Good Concentration: Good Memory: Impaired short term Fund of knowledge:  Good Insight:  Good Judgment:  Good Impulse Control: Good  Risk Assessment: Danger to Self: No Self-injurious Behavior: No Danger to Others: No Duty to Warn:no Physical Aggression / Vio lence:No  Access to Firearms a concern: No  Gang Involvement:No  Treatment plan: Will employ cognitive behavioral therapy as well as grief and loss therapy for relief of anxiety and grief.  Goals of grief therapy or to have a healthier response to the loss of his daughter and less sadness as evidenced by patient report in therapy notes as well as to help him feel less overwhelmed by her loss.  Goals are to see a 50% reduction in grie f symptoms over the next 6 months.  I will encourage the use of the patient telling his grief story about his daughter including encouraging him to bring pictures and memorabilia to help process his feelings including any feelings of guilt associated with her loss.  We will use cognitive behavioral therapy to help identify and change anxiety producing thoughts and behavior patterns so as to improve his ability to better manage anxiety a nd stress, and manage thoughts and worrisome thinking contributing to anxiety.  We will also refresh and encourage dialectical behavior therapy distress tolerance and mindfulness skills  with the intention of reducing anxiety by 50% over the next 6 months. Progress: 30% Interventions: Cognitive Behavioral Therapy  Diagnosis:PTSD, Bi-Polar D/O  Plan: F/U/  appointments scheduled weekly.  Cecile Coder, [facility]                                                                                                                    Cecile Coder, [facility]  [place] [facility] Counselor/Therapist Progress Note  Patient ID: Mirza Fessel, MRN: [id number],    Date: 04/01/2024 This session was held via video teletherapy. The patient consented to the video teletherapy and was located in his home during this session. He is aware it is the responsibility of the patient  to secure confidentiality on his end of the session. The provider was in a private home office for the  duration of this session.   The patient arrived on time for her Caregility session  Time Spent: 58 minutes, 2 PM to 2:58 PM  Treatment Type: Individual Therapy Reported Symptoms: anxiety, panic attacks A fairly difficult week because it was his daughter's birthday.  He said this year was more difficult than last year but he co uld not pinpoint why.  They did remember her about going out to a nice restaurant which is something her daughter always wanted to do.  He said they were able to laugh and joke about funny things that his daughter had said or done which is not something they were able to do this time last year so he saw that as healthy grieving.  He knows that the time change threw things off a little bit for everybody and his wife still is not feeling  well and his back has been bothering him.  He was not able to go to his first round of physical therapy this week because his back was bothering him more.  He has been thinking a lot more about his daughter which he feels comfortable with.  He is thankful that it is getting warmer and staying light longer so he can get back to some of the coping skills such as fishing that he has done in the past.   We will continue to work on coping  skills for helping with his anxiety and the progression of his Lewy body dementia diagnosis as well as processing his grief. 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 Speech/Language:  Normal Rate Affect: Appropriate Mood: normal Thought process: normal Thought content:  WNL Sensory/Perceptual disturbances:  WNL Orientation: oriente d to person, place, and situation Attention: Good Concentration: Good Memory: Impaired short term Fund of knowledge:  Good Insight:  Good Judgment:  Good Impulse Control: Good  Risk Assessment: Danger to Self: No Self-injurious Behavior: No Danger to Others: No Duty to  Warn:no Physical Aggression / Violence:No  Access to Firearms a concern: No  Gang Involvement:No  Treatment plan: Will employ cognitive behavioral ther apy as well as grief and loss therapy for relief of anxiety and grief.  Goals of grief therapy or to have a healthier response to the loss of his daughter and less sadness as evidenced by patient report in therapy notes as well as to help him feel less overwhelmed by her loss.  Goals are to see a 50% reduction in grief symptoms over the next 6 months.  I will encourage the use of the patient telling his grief story about his daughter in cluding encouraging him to bring pictures and memorabilia to help process his feelings including any feelings of guilt associated with her loss.  We will use cognitive behavioral therapy to help identify and change anxiety producing thoughts and behavior patterns so as to improve his ability to better manage anxiety and stress, and manage thoughts and worrisome thinking contributing to anxiety.  We will also refresh and encourage dialec tical behavior therapy  distress tolerance and mindfulness skills with the intention of reducing anxiety by 50% over the next 6 months. Progress: 30% Interventions: Cognitive Behavioral Therapy  Diagnosis:PTSD, Bi-Polar D/O  Plan: F/U/ appointments scheduled weekly.  Cecile Coder, [facility]                                                                                                                    Cecile Coder, [facility]               Cecile Coder, [facility]               Cecile Coder, [facility]  [place] [facility] Counselor/Therapist Progress Note  Patient ID: Jorryn Casagrande, MRN: [id number],    Date: 04/01/2024 This session was held via video teletherapy. The patient consented to the video teletherapy and was located in his home during this session. He is aware it is the responsibility of the patient to secure confidentiality on  his end of the session. The provider was in a private home office for the duration of this session.   The patient arrived on time for her Caregility session  Time Spent: 57 minutes, 2 PM to 2:57 PM  Treatment Type: Individual Therapy Reported Symptoms: anxiety, panic attacks The patient is still having significant back pain.  He cannot sit anywhere for very long and has to be careful where he sits.  There is still some unsteadi ness although he did not report any falls over the past 2 weeks.  His biggest concern now is for his wife.  She has been having some difficulty breathing and they did a test so they  found a small hole on the back side of her heart.  She is meeting with her cardiologist again next week to see what possible treatment is for her.  He still thinks there may be some issue with her diaphragm but she is meeting with the pulmonologist also.  He  is doing what he can to help her.  His daughter's birthday is March 11 so he knows that is a difficult time for both he and his wife next week.  Typically they do something to remember her but because of the wife's current health issues they cannot go to [place] as they had planned so they are talking about what to do to remember and celebrate her birthday.  We will continue to work on coping skills for helping with his anxie ty and the progression of his Lewy body dementia diagnosis as well as processing his grief. 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 Speech/Language:  Normal Rate Affect: Appropriate Mood: normal Thought process: normal Thought content:  WNL Sensory/Perceptual disturbances:  WNL Orientation: oriented to person, place, and situation  Attention: Good Concentration: Good Memory: Impaired short term Fund of knowledge:  Good Insight:  Good Judgment:  Good Impulse Control: Good  Risk Assessment: Danger to Self: No Self-injurious Behavior: No Danger to Others: No Duty to Warn:no Physical Aggression / Violence:No  Access to Firearms a concern: No  Gang Involvement:No  Treatment plan: Will employ cognitive behavioral therapy as well as grief and loss ther apy for relief of anxiety and grief.  Goals of grief therapy or to have a healthier response to the loss of his daughter and less sadness as evidenced by patient report in therapy notes as well as to help him feel less overwhelmed by her loss.  Goals are to see a 50% reduction in grief symptoms over the next 6 months.  I will encourage the use of the patient  telling his grief story about his daughter including encouraging him to bring p ictures and memorabilia to help process his feelings including any feelings of guilt associated with her loss.  We will use cognitive behavioral therapy to help identify and change anxiety producing thoughts and behavior patterns so as to improve his ability to better manage anxiety and stress, and manage thoughts and worrisome thinking contributing to anxiety.  We will also refresh and encourage dialectical behavior therapy distress to lerance and mindfulness  skills with the intention of reducing anxiety by 50% over the next 6 months. Progress: 30% Interventions: Cognitive Behavioral Therapy  Diagnosis:PTSD, Bi-Polar D/O  Plan: F/U/ appointments scheduled weekly.  Cecile Coder, [facility]                                                                                                                    Cecile Coder, [facility]  [place] [facility] Counselor/The rapist Progress Note  Patient ID: Cathy Ropp, MRN: [id number],    Date: 04/01/2024 This session was held via video teletherapy. The patient consented to the video teletherapy and was located in his home during this session. He is aware it is the responsibility of the patient to secure confidentiality on his end of the session. The provider was in a private home office for the duration of this session.   The patient arrived  on time for her Caregility session  Time Spent: 57 minutes, 2 PM to 2:57 PM  Treatment Type: Individual Therapy Reported Symptoms: anxiety, panic attacks  The patient reported that he had not been sleeping well so he did speak with his psychiatrist about adjusting some meds.  Initially they adjusted too much up and he said he felt groggy for the entire next day but they have found a combination now where he feels like he is gettin g more rested and only feels a little drowsy shortly after waking up..  There have been a couple times where he stumbled and one of the times he felt down but did not get hurt the other times he was able to catch himself.  He is trying to stay as active as he can walking daily and fishing with his wife when he can.  His biggest anxiety now is his wife has an upcoming surgery on her diaphragm in 2 weeks so he knows that will be a fairly  significant recovery time but also sees the difficulties she is having now and wants her to get some relief..  His son and daughter-in-law will be supports for them also.  He continues  to speak with friends and family members as encouragement.  He also feels that he is grieving appropriately writing to his daughter on a regular basis about the things that are going on in his and his wife's lives.  We will continue to work on coping sk ills for helping with his anxiety and the progression of his Lewy body dementia diagnosis as well as processing his grief. 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 Speech/Language:  Normal Rate Affect: Appropriate Mood: normal Thought process: normal Thought content:  WNL Sensory/Perceptual disturbances:  WNL Orientation: oriented t o person, place, and situation Attention: Good Concentration: Good Memory: Impaired short term Fund of knowledge:  Good Insight:  Good Judgment:  Good Impulse Control: Good  Risk Assessment: Danger to Self: No Self-injurious Behavior: No Danger to Others: No Duty to Warn:no Physical Aggression / Violence:No  Access to Firearms a concern: No  Gang Involvement:No  Treatment plan: Will employ cognitive behavioral therapy  as well as grief and loss therapy for relief of anxiety and grief.  Goals of grief therapy or to have a healthier response to the loss of his daughter and less sadness as evidenced by patient report in therapy notes as well as to help him feel less overwhelmed by her loss.  Goals are to see a 50% reduction in grief symptoms over the next 6 months.  I will encourage the use of the patient telling his grief story about his daughter inclu ding encouraging him to bring pictures and memorabilia to help process his feelings including any feelings of guilt associated with her loss.  We will use cognitive behavioral therapy to help identify and change anxiety producing thoughts and behavior patterns so as to improve his ability to  better manage anxiety and stress, and manage thoughts and worrisome thinking  contributing to anxiety.  We will also refresh and encourage dialectic al behavior therapy distress tolerance and mindfulness skills with the intention of reducing anxiety by 50% over the next 6 months. Progress: 30% Interventions: Cognitive Behavioral Therapy  Diagnosis:PTSD, Bi-Polar D/O  Plan: F/U/ appointments scheduled weekly.  Cecile Coder, [facility]                                                                                                                    Cecile Coder, [facility]               Cecile Coder, [facility]               Cecile Coder, [facility]               Cecile Coder, [facility]               Cecile Coder, [facility]               Cecile Coder, [facility]               Cecile Coder, [facility]                Cecile Coder, [facility]  [facility] Counselor/Therapist Progress Note  Patient ID: Rielly Corlett, MRN: [id number],    Date: 04/01/2024 This session was held via video teletherapy. The patient consented to the video teletherapy and was located in his home during this session. He is aware it is the responsibility of the patient to secure confidentiality on his end of the session. The provider was in a private home office for the duration of this session.   The patient arrived on time for her Caregility session  Time Spent: 59 minutes,  2 PM to 2:59 PM  Treatment Type: Individual Therapy Reported Symptoms: anxiety, panic attacks The patient said he jokingly said to his wife that he had not had any pain issues in the cervical area and then a few days ago he started having pain in his neck and shoulders.  He now sits related to degenerative disk and has some consistent pain somewhere in his spine with that but jokingly says it just depends on Wednesday which part of h is body is decides to show up and hurt.  He is doing the best that he can.  He is still walking which he knows is good for him but does not have right now the strength to do as much in the morning as he has done in the past so he is denying that to 3 times a day for consistency.  He is still doing some positive things and that he is staying in touch with his brother and other family members.  He is reconnecting with friends from high [place] hool and he is which she is enjoying.  One of them has had a similar experience in that she lost her son in the same way the patient lost his daughter.  He said they did not think and  Easter in which they set a place for his daughter at the table and put her picture there.  He initially had reservations about doing that he and his wife had made everyone feel as if she was really there with him.  He still feels that he is grieving in a h ealthy way.  He and his wife have always taken a trip around both of their birthdays which are 2 days apart.  That is now close to his daughter died so they have started taking some of her rashes with him and sprinkling them where they go as a way of remembrance.  They are deciding what that looks like this year in July.  He does contract for safety having no thoughts of hurting himself or anyone else. We will continue to work on cop ing skills for helping with his anxiety and the progression of his Lewy body dementia diagnosis as well as processing his grief. 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 Speech/Language:  Normal Rate Affect: Appropriate Mood: normal Thought process: normal Thought content:  WNL Sensory/Perceptual disturbances:  WNL Orientation: orie nted to person, place, and situation Attention: Good Concentration: Good Memory: Impaired short term Fund of knowledge:  Good Insight:  Good Judgment:  Good Impulse Control: Good  Risk Assessment: Danger to Self: No Self-injurious Behavior: No Danger to Others: No Duty to Warn:no Physical Aggression / Violence:No  Access to Firearms a concern: No  Gang Involvement:No  Treatment plan: Will employ cognitive behavioral t herapy as well as grief and loss therapy for relief of anxiety and grief.  Goals of grief therapy  or to have a healthier response to the loss of his daughter and less sadness as evidenced by patient report in therapy notes as well as to help him feel less overwhelmed by her loss.  Goals are to see a 50% reduction in grief symptoms over the next 6 months.  I will  encourage the use of the patient telling his grief story about his daughter  including encouraging him to bring pictures and memorabilia to help process his feelings including any feelings of guilt associated with her loss.  We will use cognitive behavioral therapy to help identify and change anxiety producing thoughts and behavior patterns so as to improve his ability to better manage anxiety and stress, and manage thoughts and worrisome thinking contributing to anxiety.  We will also refresh and encourage dia lectical behavior therapy distress tolerance and mindfulness skills with the intention of reducing anxiety by 50% over the next 6 months. Progress: 30% Interventions: Cognitive Behavioral Therapy  Diagnosis:PTSD, Bi-Polar D/O  Plan: F/U/ appointments scheduled weekly.  Cecile Coder, [facility]                                                                                                                    Cecile Coder, [facility]  [facility] Counselor/Therapist Progress Note  Patient ID: Eswin Worrell, MRN: [id number],    Date: 04/01/2024 This session was held via video teletherapy. The patient consented to the video teletherapy and was located in his home during this session. He is aware it is the responsibility of the patient to secure confidentiality on his end of the session. The provider was in a private home office for the duration of th is session.   The patient arrived on time for her Caregility session  Time Spent: 58 minutes, 2 PM to 2:58 PM  Treatment Type: Individual Therapy Reported Symptoms: anxiety, panic attacks A fairly difficult week because it was his daughter's birthday.  He said this year was more difficult than last year but he could not pinpoint why.  They did remember her about going out to a nice restaurant which is something her daughter alwa ys wanted to do.  He said they were able to laugh and joke about funny things that his daughter had said or done which is not something they were able to do this time last year so he saw that as healthy grieving.  He knows that the time change threw things off a little bit for everybody and his wife still is not feeling well and his back has been bothering him.  He was not able to go to his first round of physical therapy this week beca use his back was bothering him more.  He has been thinking a lot more about his daughter which he feels comfortable with.  He is thankful that it is getting warmer and staying light  longer so he can get back to some of the coping skills such as fishing that he has done in the past.   We will continue to work on coping skills for helping with his anxiety and the progression of his Lewy body dementia diagnosis as well as processing his  grief. 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 Speech/Language:  Normal Rate Affect: Appropriate Mood: normal Thought process: normal Thought content:  WNL Sensory/Perceptual disturbances:  WNL Orientation: oriented to person, place, and situation Attention: Good Concentration: Good Memory: Impaired short term Fund of knowledg e:  Good Insight:  Good Judgment:  Good Impulse Control: Good  Risk Assessment: Danger to Self: No Self-injurious Behavior: No Danger to Others: No Duty to Warn:no Physical Aggression / Violence:No  Access to Firearms a concern: No  Gang Involvement:No  Treatment plan: Will employ cognitive behavioral therapy as well as grief and loss therapy for relief of anxiety and grief.  Goals of grief therapy or to have a healthier  response to the loss of his daughter and less sadness as evidenced by patient report in therapy notes as well as to help him feel less overwhelmed by her loss.  Goals are to see a 50% reduction in grief symptoms over the next 6 months.  I will encourage the use of the patient telling his grief story about his daughter including encouraging him to bring pictures and memorabilia to help process his feelings including any feelings of guilt  associated with her loss.  We will use cognitive behavioral therapy to help identify and change anxiety producing thoughts and behavior patterns so as to improve his ability to better manage anxiety and stress, and manage thoughts and worrisome thinking contributing to anxiety.  We will also refresh and encourage dialectical behavior therapy distress  tolerance and  mindfulness skills with the intention of reducing anxiety by 50% over th e next 6 months. Progress: 30% Interventions: Cognitive Behavioral Therapy  Diagnosis:PTSD, Bi-Polar D/O  Plan: F/U/ appointments scheduled weekly.  Cecile Coder, [facility]                                                                                                                   Cecile Coder, [facility]                Cecile Coder, [facility]               Cecile Coder, [facility]  [facility] Counselor/Therapi st Progress Note  Patient ID: Deondra Wigger, MRN: [id number],    Date: 04/01/2024 This session was held  via video teletherapy. The patient consented to the video teletherapy and was located in his home during this session. He is aware it is the responsibility of the patient to secure confidentiality on his end of the session. The provider was in a private home office for the duration of this session.   The patient arrived on t ime for her Caregility session  Time Spent: 1:05 PM until 2 PM, 55 minutes Treatment Type: Individual Therapy Reported Symptoms: anxiety, panic attacks The patient said he came in from his walk this morning and saw what he thought was some sort of tic from his wife.  It was not the first time it happened and he wonders if it might be tardive dyskinesia because of some new medications that she is on.  She has a call into the doctor  right now and hopes to hear something back soon.  They have narrowed down to the fact they think most of her discomfort in breathing is coming from the diaphragm and they are looking for alternatives for treatment.  The patient continues to do those things which help him cope including walking, fishing when he can, spending time with his son and daughter-in-law and grandson.  He also has a way of continued coping with grief he is writ ing letters to his daughters as a way of expressing his feelings and says that is very beneficial.  He appears to be grieving in a healthy way but he knows that he to year anniversary of her death is coming up and there is some grief there but says it is not overwhelming.  We will continue to work on coping skills for helping with his anxiety and the progression of his Lewy body dementia diagnosis as well as processing his grief. Men tal Status Exam: Appearance:  Well Groomed  Behavior: Appropriate Motor: Pt. Reports some instability due to Lewey Body dementia Speech/Language:  Normal Rate Affect: Appropriate Mood: normal Thought process: normal Thought content:  WNL Sensory/Perceptual disturbances:   WNL Orientation: oriented to person, place, and situation Attention: Good Concentration: Good Memory: Impaired short term Fund of knowledge:  Good In sight:  Good Judgment:  Good Impulse Control: Good  Risk Assessment: Danger to Self: No Self-injurious Behavior: No Danger to Others: No Duty to Warn:no Physical Aggression / Violence:No  Access to Firearms a concern: No  Gang Involvement:No  Treatment plan: Will employ cognitive behavioral therapy as well as grief and loss therapy for relief of anxiety and grief.  Goals of grief therapy or to have a healthier response to  the loss of his daughter and less sadness as evidenced by patient report in therapy notes as well as to help him feel less overwhelmed by her loss.  Goals are to see a 50% reduction in grief symptoms over the next 6 months.  I will encourage the use of the patient telling his grief story about his daughter including encouraging him to bring pictures and memorabilia to help process his feelings including any feelings of guilt associated  with her loss.  We will use cognitive behavioral therapy to help identify and change anxiety producing thoughts and behavior patterns so as to improve his ability to better manage anxiety and stress, and manage thoughts and worrisome thinking contributing to anxiety.  We will also refresh and encourage dialectical behavior therapy distress tolerance  and mindfulness skills with the intention of reducing anxiety by 50% over the next 6 mon ths. Progress: 30% Interventions: Cognitive Behavioral Therapy  Diagnosis:PTSD, Bi-Polar D/O  Plan: F/U/ appointments scheduled weekly.  Cecile Coder, [facility]                                                                                                                   Cecile Coder, [facility]  [facility] Counselor/Therapist Progress Note  Patient ID: Aqil Goetting, MRN: [id number],    Date: 04/01/2024 Thi s session was held via video teletherapy. The patient consented to the video teletherapy and was located in his home during this session. He is aware it is the responsibility of the patient to secure confidentiality on his end of the session. The provider was in a private home office for the duration of this session.   The patient arrived on time for her Caregility session  Time Spent: 59 minutes, 2 PM to 2:59 PM  Treatment Type:  Individual Therapy Reported Symptoms: anxiety, panic attacks The patient continues to present with physical pain and both his back and his knee.  He started with a new physical  therapist and told him that there was a lot of arthritis in both knees and or certain things he cannot do but they had him do that anyway and now for the past 3 days he has been in significant knee pain.  He has continued to walk because he knows that is good f or him in various ways but says it has been painful.  His wife also was not feeling well but she is going back to the doctor next week to have an ablation and hopes that will bring her some relief.  For the most part he has been at the last couple weeks reaching back out and hearing from some old friends as well as time with his son and son's family.  He says that his wife's cousin is coming down for a weekend soon and they enjoy time w ith her and that he will go to his brother's house sometime in the next few weeks.  He recognizes the need for people that he cares about in helping with his depression. We will continue to work on coping skills for helping with his anxiety and the progression of his Lewy body dementia diagnosis as well as processing his grief. Mental Status Exam: Appearance:  Well Groomed  Behavior: Appropriate Motor: Pt. Reports some instabilit y due to [employer] Body dementia Speech/Language:  Normal Rate Affect: Appropriate Mood: normal Thought process: normal Thought content:  WNL Sensory/Perceptual disturbances:  WNL Orientation: oriented to person, place, and situation Attention: Good Concentration: Good Memory: Impaired short term Fund of knowledge:  Good Insight:  Good Judgment:  Good Impulse Control: Good  Risk Assessment: Danger to Self: No Self-inju rious Behavior: No Danger to Others: No Duty to Warn:no Physical Aggression / Violence:No  Access to Firearms a concern: No  Gang Involvement:No  Treatment plan: Will employ cognitive behavioral therapy as well as grief and loss therapy for relief of anxiety and grief.  Goals of grief therapy or to have a healthier response to the loss of his daughter and  less sadness as evidenced by patient report in therapy notes as well as to h elp him feel less overwhelmed by her loss.  Goals are to see a 50% reduction in grief symptoms over the next 6 months.  I will encourage the use of the patient telling his grief story about his daughter including encouraging him to bring pictures and memorabilia to help process his feelings including any feelings of guilt associated with her loss.  We will use cognitive behavioral therapy to help identify and change anxiety producing th oughts and behavior patterns so as to improve his ability to better manage anxiety and stress, and manage thoughts and worrisome thinking contributing to anxiety.  We will  also refresh and encourage dialectical behavior therapy distress tolerance and mindfulness skills with the intention of reducing anxiety by 50% over the next 6 months. Progress: 30% Interventions: Cognitive Behavioral Therapy  Diagnosis:PTSD, Bi-Polar D/O  Plan:  F/U/ appointments scheduled weekly.  Cecile Coder, [facility]                                                                                                                   Cecile Coder, [facility]               Cecile Coder, [facility]               Cecile Coder, [facility]                Cecile Coder, [facility]  [facility] Counselor/Therapist Progress Note  Patient ID: Reace Breshears, MRN : [id number],    Date: 04/01/2024 This session was held via video teletherapy. The patient consented to the video teletherapy and was located in his home during this session. He is aware it is the responsibility of the patient to secure confidentiality on his end of the session. The provider was in a private home office for the duration of this session.   The patient arrived on time for her Caregility session  Time Spent: 57 minut es, 2 PM to 2:57 PM  Treatment Type: Individual Therapy Reported Symptoms: anxiety, panic attacks The patient is still having significant back pain.  He cannot sit anywhere for very long and has to be careful where he sits.  There is still some unsteadiness although he did not report any falls over the past 2 weeks.  His biggest concern now is for his wife.  She has been having some difficulty breathing and they did a test so they fo und a small hole on the back side of her heart.  She is meeting with her cardiologist again next week to see what possible treatment is for her.  He still thinks there may be some issue  with her diaphragm but she is meeting with the pulmonologist also.  He is doing what he can to help her.  His daughter's birthday is March 11 so he knows that is a difficult time for both he and his wife next week.  Typically they do something to remem ber her but because of the wife's current health issues they cannot go to [place] as they had planned so they are talking about what to do to remember and celebrate her birthday.  We will continue to work on coping skills for helping with his anxiety and the progression of his Lewy body dementia diagnosis as well as processing his grief. Mental Status Exam: Appearance:  Well Groomed  Behavior: Appropriate Motor: Pt. Report s some instability due to Lewey Body dementia Speech/Language:  Normal Rate Affect: Appropriate Mood: normal Thought process: normal Thought content:  WNL Sensory/Perceptual disturbances:  WNL Orientation: oriented to person, place, and situation Attention: Good Concentration: Good Memory: Impaired short term Fund of knowledge:  Good Insight:  Good Judgment:  Good Impulse Control: Good  Risk Assessment: Danger to Sel f: No Self-injurious Behavior: No Danger to Others: No Duty to Warn:no Physical Aggression / Violence:No  Access to Firearms a concern: No  Gang Involvement:No  Treatment plan: Will employ cognitive behavioral therapy as well as grief and loss therapy for relief of anxiety and grief.  Goals of grief therapy or to have a healthier response to the loss of his daughter and less sadness as evidenced by patient report in therapy note s as well as to help him feel less overwhelmed by her loss.  Goals are to see a 50% reduction in grief symptoms over the next 6 months.  I will encourage the use of the patient telling his grief story about his daughter including encouraging him to bring pictures and memorabilia to help process his feelings including any feelings of guilt associated with her loss.  We  will use cognitive behavioral therapy to help identify and change anx iety producing thoughts and behavior patterns so as to improve his ability to better manage anxiety and stress, and manage thoughts and worrisome thinking contributing to anxiety.  We will also refresh and encourage dialectical behavior therapy distress tolerance and mindfulness  skills with the intention of reducing anxiety by 50% over the next 6 months. Progress: 30% Interventions: Cognitive Behavioral Therapy  Diagnosis:PTSD, Bi-P olar D/O  Plan: F/U/ appointments scheduled weekly.  Cecile Coder, [facility]                                                                                                                   Cecile Coder, [facility]  [place] [facility] Counselor/Therapist Progress Note  Patient ID: Lynton Crescenzo, MRN: [id number],    Date: 04/01/2024 This session was held via video teletherapy. The patient consented to the video teletherapy  and was located in his home during this session. He is aware it is the responsibility of the patient to secure confidentiality on his end of the session. The provider was in a private home office for the duration of this session.   The patient arrived on time for her Caregility session  Time Spent: 58 minutes, 2 PM to 2:58 PM  Treatment Type: Individual Therapy Reported Symptoms: anxiety, panic attacks A fairly difficult week b ecause it was his daughter's birthday.  He said this year was more difficult than last year but he could not pinpoint why.  They did remember her about going out to a nice restaurant which is something her daughter always wanted to do.  He said they were able to laugh and joke about funny things that his daughter had said or done which is not something they were able to do this time last year so he saw that as healthy grieving.  He know s that the time change threw things off a little bit for everybody and his wife still is not feeling well and his back has been bothering him.  He was not able to go to his first round of physical therapy this week because his back was bothering him more.  He has been thinking a lot more about his daughter which he feels comfortable with.  He is thankful that it is getting warmer and staying light longer so he can get back to some of th e coping skills such as fishing that he has done in the past.   We will continue to work on coping skills for helping with his anxiety and the progression of his Lewy body dementia  diagnosis as well as processing his grief. 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 Speech/Language:  Normal Rate Affect: Appropriate Mood: normal Thought  process: normal Thought content:  WNL Sensory/Perceptual disturbances:  WNL Orientation: oriented to person, place, and situation Attention: Good Concentration: Good Memory: Impaired short term Fund of knowledge:  Good Insight:  Good Judgment:  Good Impulse Control: Good  Risk Assessment: Danger to Self: No Self-injurious Behavior: No Danger to Others: No Duty to Warn:no Physical Aggression / Violence:No  Access to  Firearms a concern: No  Gang Involvement:No  Treatment plan: Will employ cognitive behavioral therapy as well as grief and loss therapy for relief of anxiety and grief.  Goals of grief therapy or to have a healthier response to the loss of his daughter and less sadness as evidenced by patient report in therapy notes as well as to help him feel less overwhelmed by her loss.  Goals are to see a 50% reduction in grief symptoms over the  next 6 months.  I will encourage the use of the patient telling his grief story about his daughter including encouraging him to bring pictures and memorabilia to help process his feelings including any feelings of guilt associated with her loss.  We will use cognitive behavioral therapy to help identify and change anxiety producing thoughts and behavior patterns so as to improve his ability to better manage anxiety and stress, and manag e thoughts and worrisome thinking contributing to anxiety.  We will also refresh and encourage dialectical behavior therapy distress  tolerance and mindfulness skills with the intention of reducing anxiety by 50% over the next 6 months. Progress: 30% Interventions: Cognitive Behavioral Therapy  Diagnosis:PTSD, Bi-Polar D/O  Plan: F/U/ appointments scheduled  weekly.  Cecile Coder, [facility]                                                                                                                    Cecile Coder, [facility]               Cecile Coder, [facility]               Cecile Coder, [facility]   Behavioral Health Counselor/Therapist Progress Note  Patient ID: Donnavan Covault, MRN: [id number],    Date: 04/01/2024 This session was held via video teletherapy. The patient consented to the video teletherapy and was located in his home  during this session. He is aware it is the responsibility of the patient to secure confidentiality on his end  of the session. The provider was in a private home office for the duration of this session.   The patient arrived on time for her Caregility session  Time Spent: 57 minutes, 2 PM to 2:57 PM  Treatment Type: Individual Therapy Reported Symptoms: anxiety, panic attacks The patient is still having significant back pain.  He  cannot sit anywhere for very long and has to be careful where he sits.  There is still some unsteadiness although he did not report any falls over the past 2 weeks.  His biggest concern now is for his wife.  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  She is meeting with her cardiologist again next week to see what possible treatment is for her.  He still  thinks there may be some issue with her diaphragm but she is meeting with the pulmonologist also.  He is doing what he can to help her.  His daughter's birthday is March 11 so he knows that is a difficult time for both he and his wife next week.  Typically they do something to remember her but because of the wife's current health issues they cannot go to [place] as they had planned so they are talking about what to do to remembe r and celebrate her birthday.  We will continue to work on coping skills for helping with his anxiety and the progression of his Lewy body dementia diagnosis as well as processing his grief. 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 Speech/Language:  Normal Rate Affect: Appropriate Mood: normal Thought process: normal Thought content:   WNL Sensory/Perceptual disturbances:  WNL Orientation: oriented to person, place, and situation Attention: Good Concentration: Good Memory: Impaired short term Fund of knowledge:  Good Insight:  Good Judgment:  Good Impulse Control: Good  Risk Assessment: Danger to Self:  No Self-injurious Behavior: No Danger to Others: No Duty to Warn:no Physical Aggression / Violence:No  Access to Firearms a concern: No  Gang Inv olvement:No  Treatment plan: Will employ cognitive behavioral therapy as well as grief and loss therapy for relief of anxiety and grief.  Goals of grief therapy or to have a healthier response to the loss of his daughter and less sadness as evidenced by patient report in therapy notes as well as to help him feel less overwhelmed by her loss.  Goals are to see a 50% reduction in grief symptoms over the next 6 months.  I will encourage t he use of the patient telling his grief story about his daughter including encouraging him to bring pictures and memorabilia to help process his feelings including any feelings of guilt associated with her loss.  We will use cognitive behavioral therapy to help identify and change anxiety producing thoughts and behavior patterns so as to improve his ability to better manage anxiety and stress, and manage thoughts and worrisome thinking  contributing to anxiety.  We will also refresh and encourage dialectical behavior therapy distress tolerance and mindfulness  skills with the intention of reducing anxiety by 50% over the next 6 months. Progress: 30% Interventions: Cognitive Behavioral Therapy  Diagnosis:PTSD, Bi-Polar D/O  Plan: F/U/ appointments scheduled weekly.  Cecile Coder, [facility]                                                                                                                    Cecile Coder, [facility]  [place] [facility] Counselor/Therapist Progress Note  Patient ID: Harkirat Orozco, MRN: [id number],    Date: 04/01/2024 This session was held via video teletherapy. The patient consented to the video teletherapy and was located in his home during this session. He is aware it is the responsibility of the patient to secure confidentiality on his end of the session.  The provider was in a private home office for the duration of this session.   The patient arrived on time for her Caregility session  Time Spent: 57 minutes, 2 PM to 2:57 PM  Treatment Type: Individual Therapy Reported Symptoms: anxiety, panic attacks  We will continue to work on coping skills for helping with his anxiety and the progression of his Lewy body dementia diagnosis as well as processing his grief. 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 Speech/Language:  Normal  Rate Affect: Appropriate Mood: normal Thought process: normal Thought content:  WNL Sensory/Perceptual disturbances:  WNL Orientation: oriented to person, place, and situation Attention: Good Concentration: Good Memory: Impaired short term Fund of knowledge:  Good Insight:  Good  Judgment:  Good Impulse Control: Good  Risk Assessment: Danger to Self: No Self-injurious Behavior: No Danger to Others: No Duty to Warn:no Physical Aggression / Violence:No  Access to Firearms a concern: No  Gang Involvement:No  Treatment plan: Will employ cognitive behavioral therapy as well as grief and loss therapy for relief of anxiety and grief.  Goals of grief therapy or to have a healthier response to the loss of hi s daughter and less sadness as evidenced by patient report in therapy notes as well as to help him feel less overwhelmed by her loss.  Goals are to see a 50% reduction in grief symptoms over the next 6 months.  I will encourage the use of the patient telling his grief story about his daughter including encouraging him to bring pictures and memorabilia to help process his feelings including any feelings of guilt associated with her loss.   We will use cognitive behavioral therapy to help identify and change anxiety producing thoughts and behavior patterns so as to improve his ability to better manage anxiety and stress, and manage thoughts and worrisome thinking contributing to anxiety.  We will also refresh and encourage dialectical behavior therapy distress tolerance and mindfulness skills with the intention of reducing anxiety by 50% over the next 6 months. Progress : 30% Interventions: Cognitive Behavioral Therapy  Diagnosis:PTSD, Bi-Polar D/O  Plan: F/U/ appointments scheduled weekly.  Cecile Coder,  [facility]                                                                                                                   Cecile Coder, [facility]               Cecile Coder, [facility]               Cecile Coder, [facility]  Cecile Coder, [facility]               Cecile Coder, [facility]               Cecile Coder, [facility]               Cecile Coder, [facility]  [place] [facility] Counselor/Therapist Progress Note  Patient ID: Angelino Rumery, MRN: [id number],    Date: 04/01/2024 This session was held via video teletherapy. The  patient consented to the video teletherapy and was located in his home during this session. He is aware it is th e responsibility of the patient to secure confidentiality on his end of the session. The provider was in a private home office for the duration of this session.   The patient arrived on time for her Caregility session  Time Spent: 39 minutes, 2 PM to 2:39 PM  Treatment Type: Individual Therapy Reported Symptoms: anxiety, panic attacks The patient was not feeling well today.  He started feeling really earlier in the week having a  fever bad headaches body aches.  He felt a little better yesterday but feels a little bit of a resurgence in headaches and body aches and is running a low-grade fever.  His wife is feeling some of the same symptoms.  For the most part he says he is doing well.  They did increase his memory medication because they were starting to see some increasing decline in short-term memory and he said until recently his long-term memory had been g ood but he was starting to see a difference in how he used to markers to remember what happened at certain times historically.  He feels the medication has helped and has brightened his mood a little also.  He is still trying to walk every day and fish when he can and has enjoyed watching the [employer] as a good distraction.  He reports that he feels he is managing mood fairly well.  He has an appointment in 2 weeks.  He  does contract for safety having no thoughts of hurting himself or anyone else. We will continue to work on coping skills for helping with his anxiety and the progression of his Lewy body dementia diagnosis as well as processing his grief. 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 Speech/Language:  Normal Rate Affect: Appropriate Mood: no rmal Thought process: normal Thought content:   WNL Sensory/Perceptual disturbances:  WNL Orientation: oriented to person, place, and situation Attention: Good Concentration: Good Memory: Impaired short term Fund of knowledge:  Good Insight:  Good Judgment:  Good Impulse Control: Good  Risk Assessment: Danger to Self: No Self-injurious Behavior: No Danger to Others: No Duty to Warn:no Physical Aggression / Violence: No  Access to Firearms a concern: No  Gang Involvement:No  Treatment plan: Will employ cognitive behavioral therapy as well as grief and loss therapy for relief of anxiety and grief.  Goals of grief therapy or to have a healthier response to the loss of his daughter and less sadness as evidenced by patient report in therapy notes as well as to help him feel less overwhelmed by her loss.  Goals are to see a 50% reduction in grief symp toms over the next 6 months.  I will encourage the use of the patient telling his grief story about his daughter including encouraging him to bring pictures and memorabilia to help process his feelings including any feelings of guilt associated with her loss.  We will use cognitive behavioral therapy to help identify and change anxiety producing thoughts and behavior patterns so as to improve his ability to better manage anxiety and str ess, and manage thoughts and worrisome thinking contributing to anxiety.  We will also refresh  and encourage dialectical behavior therapy distress tolerance and mindfulness skills with the intention of reducing anxiety by 50% over the next 6 months. Progress: 30% Interventions: Cognitive Behavioral Therapy  Diagnosis:PTSD, Bi-Polar D/O  Plan: F/U/ appointments scheduled weekly.  Cecile Coder, [facility]                                                                                                                    Cecile Coder,  [facility]   [place] [facility] Counselor/Therapist Progress Note  Patient ID: Jaylee Lantry, MRN: [id number],    Date: 04/01/2024 This session was held via video teletherapy. The patient consented to the video teletherapy and was located in his home during this session. He is aware it is the responsibility of the patient to se cure confidentiality on his end of the session. The provider was in a private home office for the duration of this session.   The patient arrived on time for her Caregility session  Time Spent: 58 minutes, 2 PM to 2:58 PM  Treatment Type: Individual Therapy Reported Symptoms: anxiety, panic attacks A fairly difficult week because it was his daughter's birthday.  He said this year was more  difficult than last year but he could no t pinpoint why.  They did remember her about going out to a nice restaurant which is something her daughter always wanted to do.  He said they were able to laugh and joke about funny things that his daughter had said or done which is not something they were able to do this time last year so he saw that as healthy grieving.  He knows that the time change threw things off a little bit for everybody and his wife still is not feeling well a nd his back has been bothering him.  He was not able to go to his first round of physical therapy this week because his back was bothering him more.  He has been thinking a lot more about his daughter which he feels comfortable with.  He is thankful that it is getting warmer and staying light longer so he can get back to some of the coping skills such as fishing that he has done in the past.   We will continue to work on coping skill s for helping with his anxiety and the progression of his Lewy body dementia diagnosis as well as processing his grief. 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 Speech/Language:  Normal Rate Affect: Appropriate Mood: normal Thought process: normal Thought content:  WNL Sensory/Perceptual disturbances:  WNL Orientation: oriented to p erson, place, and situation Attention: Good Concentration: Good Memory: Impaired short term Fund of knowledge:  Good Insight:  Good Judgment:  Good Impulse Control: Good  Risk Assessment: Danger to Self: No Self-injurious Behavior: No Danger to Others: No Duty to Warn:no Physical Aggression / Violence:No  Access to Firearms a concern: No  Gang Involvement:No  Treatment plan: Will employ cognitive behavioral therapy as  well as grief and loss therapy for relief of anxiety and grief.  Goals of grief therapy or to have a healthier response to the loss of his  daughter and less sadness as evidenced by patient report in therapy notes as well as to help him feel less overwhelmed by her loss.  Goals are to see a 50% reduction in grief symptoms over the next 6 months.  I will encourage the use of the patient telling his grief story about his daughter includin g encouraging him to bring pictures and memorabilia to help process his feelings including any feelings of guilt associated with her loss.  We will use cognitive behavioral therapy to help identify and change anxiety producing thoughts and behavior patterns so as to improve his ability to better manage anxiety and stress, and manage thoughts and worrisome thinking contributing to anxiety.  We will also refresh and encourage dialectical  behavior therapy  distress tolerance and mindfulness skills with the intention of reducing anxiety by 50% over the next 6 months. Progress: 30% Interventions: Cognitive Behavioral Therapy  Diagnosis:PTSD, Bi-Polar D/O  Plan: F/U/ appointments scheduled weekly.  Cecile Coder, [facility]                                                                                                                    Cecile Coder, [facility]               Cecile Coder, [facility]               Cecile Coder, [facility]  [place] [facility] Counselor/Therapist Progress Note  Patient ID: Jovon Winterhalter, MRN: [id number],    Date: 04/01/2024 This session was held via video teletherapy. The patient consented to the video teletherapy and was located in his home during this session. He is aware it is the responsibility of the patient to secure confidentiality on his end of the se ssion. The provider was in a private home office for the duration of this session.   The patient arrived on time for her Caregility session  Time Spent: 1:05 PM until 2 PM, 55 minutes Treatment Type: Individual Therapy Reported Symptoms: anxiety, panic attacks The patient said he came in from his walk this morning and saw what he thought was some sort of tic from his wife.  It was not the first time it happened and he wonders if  it might be tardive dyskinesia because of some new medications that she is on.  She has a call into the doctor right now and hopes to hear something back soon.  They have narrowed down  to the fact they think most of her discomfort in breathing is coming from the diaphragm and they are looking for alternatives for treatment.  The patient continues to do those things which help him cope including walking, fishing when he can, spending  time with his son and daughter-in-law and grandson.  He also has a way of continued coping with grief he is writing letters to his daughters as a way of expressing his feelings and says that is very beneficial.  He appears to be grieving in a healthy way but he knows that he to year anniversary of her death is coming up and there is some grief there but says it is not overwhelming.  We will continue to work on coping skills for helpin g with his anxiety and the progression of his Lewy body dementia diagnosis as well as processing his grief. 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 Speech/Language:  Normal Rate Affect: Appropriate Mood: normal Thought process: normal Thought content:  WNL Sensory/Perceptual disturbances:  WNL Orientation: oriented to person, place , and situation Attention: Good Concentration: Good Memory: Impaired short term Fund of knowledge:  Good Insight:  Good Judgment:  Good Impulse Control: Good  Risk Assessment: Danger to Self: No Self-injurious Behavior: No Danger to Others: No Duty to Warn:no Physical Aggression / Violence:No  Access to Firearms a concern: No  Gang Involvement:No  Treatment plan: Will employ cognitive behavioral therapy as well as gri ef and loss therapy for relief of anxiety and grief.  Goals of grief therapy or to have a healthier response to the loss of his daughter and less sadness as evidenced by patient report in therapy notes as well as to help him feel less overwhelmed by her loss.  Goals are to see a 50% reduction in grief symptoms over the next 6 months.  I will encourage the use of the  patient telling his grief story about his daughter including encouragin g him to bring pictures and memorabilia to help process his feelings including any feelings of guilt associated with her loss.  We will use cognitive behavioral therapy to help identify and change anxiety producing thoughts and behavior patterns so as to improve his ability to better manage anxiety and stress, and manage thoughts and worrisome thinking contributing to anxiety.  We will also refresh and encourage dialectical behavior the rapy distress tolerance  and mindfulness skills with the intention of reducing anxiety by 50% over the next 6 months. Progress: 30% Interventions: Cognitive Behavioral Therapy  Diagnosis:PTSD, Bi-Polar D/O  Plan: F/U/ appointments scheduled weekly.  Cecile Coder, [facility]                                                                                                                    Cecile Coder, [facility]  [place] [facility] Counselor/Therapist Progress Note  Patient ID: Tobias Avitabile, MRN: [id number],    Date: 04/01/2024 This session was held via video teletherapy. The patient consented to the video teletherapy and was located in his home during this session. He is aware it is the responsibility of the patient to secure confidentiality on his end of the session. The provider was in a private home office for the duration of this session.   The  patient arrived on time for her Caregility session  Time Spent: 59 minutes, 2 PM to 2:59 PM  Treatment Type: Individual Therapy Reported Symptoms: anxiety, panic attacks The patient continues to present with physical pain and both his back and his knee.  He started with a new physical therapist and told him that there was a lot of arthritis in both knees and or certain things he cannot do but they had him do that anyway and now for  the past 3 days he has been in significant knee pain.  He has continued to walk because he knows that is good for him in various ways but says it has been painful.  His wife also was not feeling well but she is going back to the doctor next week to have an ablation and hopes that will bring her some relief.  For the most part he has been at the last couple weeks reaching back out and hearing from some old friends as well as time with h is son and son's family.  He says that his wife's cousin is coming down for a weekend soon and they enjoy time with her and that he will go to his brother's house sometime in the next  few weeks.  He recognizes the need for people that he cares about in helping with his depression. We will continue to work on coping skills for helping with his anxiety and the progression of his Lewy body dementia diagnosis as well as processing his grie f. 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 Speech/Language:  Normal Rate Affect: Appropriate Mood: normal Thought process: normal Thought content:  WNL Sensory/Perceptual disturbances:  WNL Orientation: oriented to person, place, and situation Attention: Good Concentration: Good Memory: Impaired short term Fund of knowledge:  G ood Insight:  Good Judgment:  Good Impulse Control: Good  Risk Assessment: Danger to Self: No Self-injurious Behavior: No Danger to Others: No Duty to Warn:no Physical Aggression / Violence:No  Access to Firearms a concern: No  Gang Involvement:No  Treatment plan: Will employ cognitive behavioral therapy as well as grief and loss therapy for relief of anxiety and grief.  Goals of grief therapy or to have a healthier respo nse to the loss of his daughter and less sadness as evidenced by patient report in therapy notes as well as to help him feel less overwhelmed by her loss.  Goals are to see a 50% reduction in grief symptoms over the next 6 months.  I will encourage the use of the patient telling his grief story about his daughter including encouraging him to bring pictures and memorabilia to help process his feelings including any feelings of guilt asso ciated with her loss.  We will use cognitive behavioral therapy to help identify and change anxiety producing thoughts and behavior patterns so as to improve his ability to better manage anxiety and stress, and manage thoughts and worrisome thinking contributing to anxiety.  We will also  refresh and encourage dialectical behavior therapy distress tolerance and  mindfulness skills with the intention of reducing anxiety by 50% over the nex t 6 months. Progress: 30% Interventions: Cognitive Behavioral Therapy  Diagnosis:PTSD, Bi-Polar D/O  Plan: F/U/ appointments scheduled weekly.  Cecile Coder, [facility]                                                                                                                   Cecile Coder, [facility]                Cecile Coder, [facility]               Cecile Coder, [facility]               Cecile Coder, [facility]  [place] [facility] Counselor/Therapist Progress Note  Patient ID: Rhiley Solem, MRN: [id number],    Date: 04/01/2024 This session was held via video teletherapy. The patient consented to the video teletherapy and was located in his home during this session. He is aware it is the responsibility of the patient to secure confidentiality on his end of the session. The provider was in a private home office for t he duration of this session.   The patient arrived on time for her Caregility session  Time Spent: 57 minutes, 2 PM to 2:57 PM  Treatment Type: Individual Therapy Reported Symptoms: anxiety, panic attacks The patient is still having significant back pain.  He cannot sit anywhere for very long and has to be careful where he sits.  There is still some unsteadiness although he did not report any falls over the past 2 weeks.  His bi ggest concern now is for his wife.  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  She is meeting with her cardiologist again next week to see what possible treatment is for her.  He still thinks there may be some issue with her diaphragm but she is meeting with the pulmonologist also.  He is doing what he can to help her.  His daughter's birthday is March 1 1 so he knows that is a difficult time for both he and his wife next week.  Typically they do something to remember her but because of the wife's current health issues they cannot go to [place] as they had planned so they are talking about what to do to remember and celebrate her birthday.  We will continue to work on coping skills for helping with his anxiety and the progression of his Lewy body dementia diagnosis as well as pr ocessing his grief. 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 Speech/Language:  Normal Rate Affect: Appropriate Mood: normal Thought process: normal Thought content:   WNL Sensory/Perceptual disturbances:  WNL Orientation: oriented to person, place, and situation Attention: Good Concentration: Good Memory: Impaired short term Fund  of knowledge:  Good Insight:  Good Judgment:  Good Impulse Control: Good  Risk Assessment: Danger to Self: No Self-injurious Behavior: No Danger to Others: No Duty to Warn:no Physical Aggression / Violence:No  Access to Firearms a concern: No  Gang Involvement:No  Treatment plan: Will employ cognitive behavioral therapy as well as grief and loss therapy for relief of anxiety and grief.  Goals of grief therapy or to have  a healthier response to the loss of his daughter and less sadness as evidenced by patient report in therapy notes as well as to help him feel less overwhelmed by her loss.  Goals are to see a 50% reduction in grief symptoms over the next 6 months.  I will encourage the use of the patient telling his grief story about his daughter including encouraging him to bring pictures and memorabilia to help process his feelings including any feeli ngs of guilt associated with her loss.  We will use cognitive behavioral therapy to help identify and change anxiety producing thoughts and behavior patterns so as to improve his ability to better manage anxiety and stress, and manage thoughts and worrisome thinking contributing to anxiety.  We will also refresh and encourage dialectical behavior therapy distress tolerance and mindfulness skills  with the intention of reducing anxiety by  50% over the next 6 months. Progress: 30% Interventions: Cognitive Behavioral Therapy  Diagnosis:PTSD, Bi-Polar D/O  Plan: F/U/ appointments scheduled weekly.  Cecile Coder, [facility]                                                                                                                   Cecile Coder, [facility]  C  [place] [facility] Counselor/Therapist Progress Note  Patient ID: Shaheer Bonfield, MRN: [id number],     Date: 04/01/2024 This session was held via video teletherapy. The patient consented to the video teletherapy and was located in his home during this session. He is aware it is the responsibility of the patient to secure confidentiality on his end of the session. The provider was in a private home office for the duration of this session.   The patient arrived on time for her Caregility session  Time Spent: 58 minutes, 2 PM to 2: 58 PM  Treatment Type: Individual Therapy Reported Symptoms: anxiety, panic attacks A fairly difficult week because it was his daughter's birthday.  He said this year was more  difficult than last year but he could not pinpoint why.  They did remember her about going out to a nice restaurant which is something her daughter always wanted to do.  He said they were able to laugh and joke about funny things that his daughter had said or d one which is not something they were able to do this time last year so he saw that as healthy grieving.  He knows that the time change threw things off a little bit for everybody and his wife still is not feeling well and his back has been bothering him.  He was not able to go to his first round of physical therapy this week because his back was bothering him more.  He has been thinking a lot more about his daughter which he feels comfo rtable with.  He is thankful that it is getting warmer and staying light longer so he can get back to some of the coping skills such as fishing that he has done in the past.   We will continue to work on coping skills for helping with his anxiety and the progression of his Lewy body dementia diagnosis as well as processing his grief. Mental Status Exam: Appearance:  Well Groomed  Behavior: Appropriate Motor: Pt. Reports some in stability due to Lewey Body dementia Speech/Language:  Normal Rate Affect: Appropriate Mood: normal Thought process: normal Thought content:  WNL Sensory/Perceptual disturbances:  WNL Orientation: oriented to person, place, and situation Attention: Good Concentration: Good Memory: Impaired short term Fund of knowledge:  Good Insight:  Good Judgment:  Good Impulse Control: Good  Risk Assessment: Danger to Self: No S elf-injurious Behavior: No Danger to Others: No Duty to Warn:no Physical Aggression / Violence:No  Access to Firearms a concern: No  Gang Involvement:No  Treatment plan: Will employ cognitive behavioral therapy as well as grief and loss therapy for relief of anxiety and grief.  Goals of grief therapy or to have a healthier response to the loss of his  daughter and less sadness as evidenced by patient report in therapy notes as well  as to help him feel less overwhelmed by her loss.  Goals are to see a 50% reduction in grief symptoms over the next 6 months.  I will encourage the use of the patient telling his grief story about his daughter including encouraging him to bring pictures and memorabilia to help process his feelings including any feelings of guilt associated with her loss.  We will use cognitive behavioral therapy to help identify and change anxiety prod ucing thoughts and behavior patterns so as to improve his ability to better manage anxiety and stress, and manage thoughts and worrisome thinking contributing to anxiety.  We will also refresh and encourage dialectical behavior therapy  distress tolerance and mindfulness skills with the intention of reducing anxiety by 50% over the next 6 months. Progress: 30% Interventions: Cognitive Behavioral Therapy  Diagnosis:PTSD, Bi-Polar D/O   Plan: F/U/ appointments scheduled weekly.  Cecile Coder, [facility]                                                                                                                   Cecile Coder, [facility]               Cecile Coder, [facility]               Cecile Coder, [facility]  [place] [facility] Counselor/Therapist Progress Note  Patient ID: Damaree Sargent, MRN: [id number],    Date: 04/01/2024 This ses sion was held via video teletherapy. The patient consented to the video teletherapy and was located in his home during this session. He is aware it is the responsibility of the patient to secure confidentiality on his end of the session. The provider was in a private home office for the duration of this session.   The patient arrived on time for her Caregility session  Time Spent: 57 minutes, 2 PM to 2:57 PM  Treatment Type: Indiv idual Therapy Reported Symptoms: anxiety, panic attacks The patient is still having significant back pain.  He cannot sit anywhere for very long and has to be careful where he sits.  There is still some unsteadiness although he did not report any falls over the past 2 weeks.  His biggest concern now is for his wife.  She has been having some difficulty breathing and they did a test so they found a small hole on the back side of her he art.  She is meeting with her cardiologist again next week to see what possible treatment is for her.  He still thinks there may be some issue with her diaphragm but she is meeting with  the pulmonologist also.  He is doing what he can to help her.  His daughter's birthday is March 11 so he knows that is a difficult time for both he and his wife next week.  Typically they do something to remember her but because of the wife's current h ealth issues they cannot go to [place] as they had planned so they are talking about what to do to remember and celebrate her birthday.  We will continue to work on coping skills for helping with his anxiety and the progression of his Lewy body dementia diagnosis as well as processing his grief. Mental Status Exam: Appearance:  Well Groomed  Behavior: Appropriate Motor: Pt. Reports some instability due to Lewey Body dement ia Speech/Language:  Normal Rate Affect: Appropriate Mood: normal Thought process: normal Thought content:  WNL Sensory/Perceptual disturbances:  WNL Orientation: oriented to person, place, and situation Attention: Good Concentration: Good Memory: Impaired short term Fund of knowledge:  Good Insight:  Good Judgment:  Good Impulse Control: Good  Risk Assessment: Danger to Self: No Self-injurious Behavior: No Danger  to Others: No Duty to Warn:no Physical Aggression / Violence:No  Access to Firearms a concern: No  Gang Involvement:No  Treatment plan: Will employ cognitive behavioral therapy as well as grief and loss therapy for relief of anxiety and grief.  Goals of grief therapy or to have a healthier response to the loss of his daughter and less sadness as evidenced by patient report in therapy notes as well as to help him feel less overwhel med by her loss.  Goals are to see a 50% reduction in grief symptoms over the next 6 months.  I will encourage the use of the patient telling his grief story about his daughter including encouraging him to bring pictures and memorabilia to help process his feelings including any feelings of guilt associated with her loss.  We will use cognitive behavioral therapy to  help identify and change anxiety producing thoughts and behavior patter ns so as to improve his ability to better manage anxiety and stress, and manage thoughts and worrisome thinking contributing to anxiety.  We will also refresh and encourage dialectical behavior therapy distress tolerance and mindfulness  skills with the intention of reducing anxiety by 50% over the next 6 months. Progress: 30% Interventions: Cognitive Behavioral Therapy  Diagnosis:PTSD, Bi-Polar D/O  Plan: F/U/ appointments schedul ed weekly.  Cecile Coder, [facility]                                                                                                                   Cecile Coder, [facility]  [place] [facility] Counselor/Therapist Progress Note  Patient ID: Artez Regis, MRN: [id number],    Date: 04/01/2024 This session was held via video teletherapy. The patient consented to the video teletherapy and was located in his home during this se ssion. He is aware it is the responsibility of the patient to secure confidentiality on his end of the session. The provider was in a private home office for the duration of this session.   The patient arrived on time for her Caregility session  Time Spent: 57 minutes, 2 PM to 2:57 PM  Treatment Type: Individual Therapy Reported Symptoms: anxiety, panic attacks That was a situation earlier in the week when the patient's neighbor  was banging on his back door and window.  There had been situations where the same neighbor had said things and use profanity directed at his wife feeling that the patient and his wife and let their dog use the bathroom in his yard.  The patient said they are very careful because her dog is old and did not think that happen but did not appreciate how they were handled things.  He said he got angry very quickly and did not handle it the  way that he wanted to.  The situation has been resolved but the patient has had very clear boundaries with his neighbor. His wife continues to progress well from her surgery.  Physically the patient is doing fairly well.  He had to see one of his brothers this week.  He is spending time with his son and son's family.  He has gotten some retirement/insurance things works out for the most part and is glad to have that settled. We will  continue to work on coping skills for helping with his anxiety and the progression of his Lewy body dementia diagnosis as well as processing his grief. Mental Status Exam: Appearance:   Well Groomed  Behavior: Appropriate Motor: Pt. Reports some instability due to Lewey Body dementia Speech/Language:  Normal Rate Affect: Appropriate Mood: normal Thought process: normal Thought content:  WNL Sensory/Perceptual disturbances:   WNL Orientation: oriented to person, place, and situation Attention: Good Concentration: Good Memory: Impaired short term Fund of knowledge:  Good Insight:  Good Judgment:  Good Impulse Control: Good  Risk Assessment: Danger to Self: No Self-injurious Behavior: No Danger to Others: No Duty to Warn:no Physical Aggression / Violence:No  Access to Firearms a concern: No  Gang Involvement:No  Treatment plan: Will employ  cognitive behavioral therapy as well as grief and loss therapy for relief of anxiety and grief.  Goals of grief therapy or to have a healthier response to the loss of his daughter and less sadness as evidenced by patient report in therapy notes as well as to help him feel less overwhelmed by her loss.  Goals are to see a 50% reduction in grief symptoms over the next 6 months.  I will encourage the use of the patient telling his grief s tory about his daughter including encouraging him to bring pictures and memorabilia to help process his feelings including any feelings of guilt associated with her loss.  We will use cognitive behavioral therapy to help identify and change anxiety producing thoughts and behavior patterns so as to improve his ability to better manage anxiety and stress, and manage thoughts and worrisome thinking contributing to anxiety.  We will also re fresh and encourage dialectical behavior therapy distress tolerance and mindfulness skills with the intention of reducing anxiety by 50% over the  next 6 months. Progress: 30% Interventions: Cognitive Behavioral Therapy  Diagnosis:PTSD, Bi-Polar D/O  Plan: F/U/ appointments scheduled weekly.  Cecile Coder,  [facility]                                                                                                                    Cecile Coder, [facility]               Cecile Coder, [facility]               Cecile Coder, [facility]               Cecile Coder, [facility]               Cecile Coder, [facility]               Cecile Coder, [facility]               Cecile Coder, [facility]                Cecile Coder, [facility]               Cecile Coder, [facility]               Cecile Coder, [facility]  [place] [facility]  Counselor/Therapist Progress Note  Patient ID: Evan Mackie, MRN: [id number],    Date: 04/01/2024 This session was held via video teletherapy. The patient consented to the video teletherapy and was located in his home during this session. He is aware it is the responsibility of the patient to secure confidentiality on his end of the session. The provider was in a private ho me office for the duration of this session.   The patient arrived on time for her Caregility session  Time Spent: 59 minutes, 2 PM to 2:59 PM  Treatment Type: Individual Therapy Reported Symptoms: anxiety, panic attacks The patient said he jokingly said to his wife that he had not had any pain issues in the cervical area and then a few days ago he started having pain in his neck and shoulders.  He now sits related to degenerativ e disk and has some consistent pain somewhere in his spine with that but jokingly says it just depends on Wednesday which part of his body is decides to show up and hurt.  He is doing the best that he can.  He is still walking which he knows is good for him but does not have right now the strength to do as much in the morning as he has done in the past so he is denying that to 3 times a day for consistency.  He is still doing some posit ive things and that he is staying in touch with his brother and other family members.  He is reconnecting with friends from high school and he is which she is enjoying.  One of them has had a similar experience in that she lost her son in the same way the patient lost his daughter.  He said they did not think and Easter in which they set a place for his daughter at the table and put her picture there.  He initially had reservations abou t doing that he and his wife had made everyone feel as if she was really there with him.  He still feels that he is grieving in a healthy way.  He and his wife have always taken a trip around both of their birthdays which are 2 days apart.   That is now close to his daughter died so they have started taking some of her rashes with him and sprinkling them where they go as a way of remembrance.  They are deciding what that looks like this  year in July.  He does contract for safety having no thoughts of hurting himself or anyone else. We will continue to work on coping skills for helping with his anxiety and the progression of his Lewy body dementia diagnosis as well as processing his grief. 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 Speech/Language:  Normal Rate Affect: Ap propriate Mood: normal Thought process: normal Thought content:  WNL Sensory/Perceptual disturbances:  WNL Orientation: oriented to person, place, and situation Attention: Good Concentration: Good Memory: Impaired short term Fund of knowledge:  Good Insight:  Good Judgment:  Good Impulse Control: Good  Risk Assessment: Danger to Self: No Self-injurious Behavior: No Danger to Others: No Duty to Warn:no Physical Agg ression / Violence:No  Access to Firearms a concern: No  Gang Involvement:No  Treatment plan: Will employ cognitive behavioral therapy as well as grief and loss therapy for relief of anxiety and grief.  Goals of grief therapy  or to have a healthier response to the loss of his daughter and less sadness as evidenced by patient report in therapy notes as well as to help him feel less overwhelmed by her loss.  Goals are to see a 50% redu ction in grief symptoms over the next 6 months.  I will encourage the use of the patient telling his grief story about his daughter including encouraging him to bring pictures and memorabilia to help process his feelings including any feelings of guilt associated with her loss.  We will use cognitive behavioral therapy to help identify and change anxiety producing thoughts and behavior patterns so as to improve his ability  to better man age anxiety and stress, and manage thoughts and worrisome thinking contributing to anxiety.  We will also refresh and encourage dialectical behavior therapy distress tolerance and mindfulness skills with the intention of reducing anxiety by 50% over the next 6 months. Progress: 30% Interventions: Cognitive Behavioral Therapy  Diagnosis:PTSD, Bi-Polar D/O  Plan: F/U/ appointments scheduled weekly.  Cecile Coder, [facility]                                                                                                                    Cecile Coder, [facility]  [place] [facility]  Counselor/Therapist Progress Note  Patient ID: Doran Nestle, MRN: [id number],    Date: 04/01/2024 This session was held via video teletherapy. The patient consented to the video teletherapy and was located in his home during this session. He is aware it is the responsibility o f the patient to secure confidentiality on his end of the session. The provider was in a private home office for the duration of this session.   The patient arrived on time for her Caregility session  Time Spent: 58 minutes, 2 PM to 2:58 PM  Treatment Type: Individual Therapy Reported Symptoms: anxiety, panic attacks A fairly difficult week because it was his daughter's birthday.  He said this year was more difficult than last y ear but he could not pinpoint why.  They did remember her about going out to a nice restaurant which is something her daughter always wanted to do.  He said they were able to laugh and joke about funny things that his daughter had said or done which is not something they were able to do this time last year so he saw that as healthy grieving.  He knows that the time change threw things off a little bit for everybody and his wife still is  not feeling well and his back has been bothering him.  He was not able to go to his first round of physical therapy this week because his back was bothering him more.  He has been thinking a lot more about his daughter which he feels comfortable with.  He is thankful that it is getting warmer and staying light longer so he can get back to some of the coping skills such as fishing that he has done in the past.   We will continue to w ork on coping skills for helping with his anxiety and the progression of his Lewy body dementia diagnosis as well as processing his grief. 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 Speech/Language:  Normal  Rate Affect: Appropriate Mood: normal Thought process: normal Thought content:  WNL Sensory/Perceptual disturbances:  WNL Orienta tion: oriented to person, place, and situation Attention: Good Concentration: Good Memory: Impaired short term Fund of knowledge:  Good Insight:  Good Judgment:  Good Impulse Control: Good  Risk Assessment: Danger to Self: No Self-injurious Behavior: No Danger to Others: No Duty to Warn:no Physical Aggression / Violence:No  Access to Firearms a concern: No  Gang Involvement:No  Treatment plan: Will employ cognitive be havioral therapy as well as grief and loss therapy for relief of anxiety and grief.  Goals of grief therapy or to have a healthier response to the loss of his daughter and less sadness as evidenced by patient report in therapy notes as well as to help him feel less overwhelmed by her loss.  Goals are to see a 50% reduction in grief symptoms over the next 6 months.  I will encourage the use of the patient telling his grief story about hi s daughter including encouraging him to bring pictures and memorabilia to help process his feelings including any feelings of guilt associated with her loss.  We will use cognitive behavioral therapy to help identify and change anxiety producing thoughts and behavior patterns so as to improve his ability to better manage anxiety and stress, and manage thoughts and worrisome thinking contributing to anxiety.  We will also refresh and enc ourage dialectical behavior therapy  distress tolerance and mindfulness skills with the intention of reducing anxiety by 50% over the next 6 months. Progress: 30% Interventions: Cognitive Behavioral Therapy  Diagnosis:PTSD, Bi-Polar D/O  Plan: F/U/ appointments scheduled weekly.  Cecile Coder, [facility]                                                                                                                     Cecile Coder, [facility]               Cecile Coder, [facility]               Cecile Coder, [facility]  [place] [facility] Counselor/Therapist Progress Note  Patient ID: Bosten Newstrom, MRN: [id number],    Date: 04/01/2024 This session was held via video teletherapy. The patient consented to the video teletherapy and was located in his home during this session. He is aware it is the responsibility of the patient to secure confidentiality o n his end of the session. The provider was in a private home office for the duration of this session.   The patient arrived on time for her Caregility session  Time Spent: 1:05 PM  until 2 PM, 55 minutes Treatment Type: Individual Therapy Reported Symptoms: anxiety, panic attacks The patient said he came in from his walk this morning and saw what he thought was some sort of tic from his wife.  It was not the first time it happene d and he wonders if it might be tardive dyskinesia because of some new medications that she is on.  She has a call into the doctor right now and hopes to hear something back soon.  They have narrowed down to the fact they think most of her discomfort in breathing is coming from the diaphragm and they are looking for alternatives for treatment.  The patient continues to do those things which help him cope including walking, fishing whe n he can, spending time with his son and daughter-in-law and grandson.  He also has a way of continued coping with grief he is writing letters to his daughters as a way of expressing his feelings and says that is very beneficial.  He appears to be grieving in a healthy way but he knows that he to year anniversary of her death is coming up and there is some grief there but says it is not overwhelming.  We will continue to work on copin g skills for helping with his anxiety and the progression of his Lewy body dementia diagnosis as well as processing his grief. 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 Speech/Language:  Normal Rate Affect: Appropriate Mood: normal Thought process: normal Thought content:  WNL Sensory/Perceptual disturbances:  WNL Orientation: orient ed to person, place, and situation Attention: Good Concentration: Good Memory: Impaired short term Fund of knowledge:  Good Insight:  Good Judgment:  Good Impulse Control: Good  Risk Assessment: Danger to Self: No Self-injurious Behavior: No Danger to Others: No Duty to Warn:no Physical Aggression / Violence:No  Access to Firearms a concern: No  Gang  Involvement:No  Treatment plan: Will employ cognitive behavioral the rapy as well as grief and loss therapy for relief of anxiety and grief.  Goals of grief therapy or to have a healthier response to the loss of his daughter and less sadness as evidenced by patient report in therapy notes as well as to help him feel less overwhelmed by her loss.  Goals are to see a 50% reduction in grief symptoms over the next 6 months.  I will encourage the use of the patient telling his grief story about his daughter i ncluding encouraging him to bring pictures and memorabilia to help process his feelings including any feelings of guilt associated with her loss.  We will use cognitive behavioral therapy to help identify and change anxiety producing thoughts and behavior patterns so as to improve his ability to better manage anxiety and stress, and manage thoughts and worrisome thinking contributing to anxiety.  We will also refresh and encourage diale ctical behavior therapy distress tolerance  and mindfulness skills with the intention of reducing anxiety by 50% over the next 6 months. Progress: 30% Interventions: Cognitive Behavioral Therapy  Diagnosis:PTSD, Bi-Polar D/O  Plan: F/U/ appointments scheduled weekly.  Cecile Coder, [facility]                                                                                                                    Cecile Coder, [facility]  [place] [facility] Counselor/Therapist Progress Note  Patient ID: Akhilesh Sassone, MRN: [id number],    Date: 04/01/2024 This session was held via video teletherapy. The patient consented to the video teletherapy and was located in his home during this session. He is aware it is the responsibility of the patient to secure confidentiality on his end of the session. The provider was in a private home office for the duration of this  session.   The patient arrived on time for her Caregility session  Time Spent: 59 minutes, 2 PM to 2:59 PM  Treatment Type: Individual Therapy Reported Symptoms: anxiety, panic attacks The patient continues to present with physical pain and both his back and his knee.  He started with a new physical therapist and told him that there was a lot of arthritis in both knees and or certain things he cannot do but they had him do that  anyway and now for the past 3 days he has been in significant knee pain.  He has continued to walk because he knows that is good for him in various ways but says it has been painful.   His wife also was not feeling well but she is going back to the doctor next week to have an ablation and hopes that will bring her some relief.  For the most part he has been at the last couple weeks reaching back out and hearing from some old friends as  well as time with his son and son's family.  He says that his wife's cousin is coming down for a weekend soon and they enjoy time with her and that he will go to his brother's house sometime in the next few weeks.  He recognizes the need for people that he cares about in helping with his depression. We will continue to work on coping skills for helping with his anxiety and the progression of his Lewy body dementia diagnosis as well as  processing his grief. 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 Speech/Language:  Normal Rate Affect: Appropriate Mood: normal Thought process: normal Thought content:  WNL Sensory/Perceptual disturbances:  WNL Orientation: oriented to person, place, and situation Attention: Good Concentration: Good Memory: Impaired short term Fu nd of knowledge:  Good Insight:  Good Judgment:  Good Impulse Control: Good  Risk Assessment: Danger to Self: No Self-injurious Behavior: No Danger to Others: No Duty to Warn:no Physical Aggression / Violence:No  Access to Firearms a concern: No  Gang Involvement:No  Treatment plan: Will employ cognitive behavioral therapy as well as grief and loss therapy for relief of anxiety and grief.  Goals of grief therapy or to hav e a healthier response to the loss of his daughter and less sadness as evidenced by patient report in therapy notes as well as to help him feel less overwhelmed by her loss.  Goals are to see a 50% reduction in grief symptoms over the next 6 months.  I will encourage the use of the patient telling his grief story about his daughter including encouraging him to bring  pictures and memorabilia to help process his feelings including any fee lings of guilt associated with her loss.  We will use cognitive behavioral therapy to help identify and change anxiety producing thoughts and behavior patterns so as to improve his ability to better manage anxiety and stress, and manage thoughts and worrisome thinking contributing to anxiety.  We will  also refresh and encourage dialectical behavior therapy distress tolerance and mindfulness skills with the intention of reducing anxiety  by 50% over the next 6 months. Progress: 30% Interventions: Cognitive Behavioral Therapy  Diagnosis:PTSD, Bi-Polar D/O  Plan: F/U/ appointments scheduled weekly.  Cecile Coder, [facility]                                                                                                                   Cecile Coder, [facility]               Cecile Coder, [facility]               Cecile Coder, [facility]               Cecile Coder, [facility]  [place] [facility] Counselor/Therapist Progress Note  Patient ID: Meric Joye, MRN: [id number],    Date: 04/01/2024 This session was held via video teletherapy. The patient consented to the video teletherapy and was located in his home during this session. He is aware it is the responsibility of the patient to secure confidentiality on his end of the session. The provider was in a privat e home office for the duration of this session.   The patient arrived on time for her Caregility session  Time Spent: 57 minutes, 2 PM to 2:57 PM  Treatment Type: Individual Therapy Reported Symptoms: anxiety, panic attacks The patient is still having significant back pain.  He cannot sit anywhere for very long and has to be careful where he sits.  There is still some unsteadiness although he did not report any falls over the pa st 2 weeks.  His biggest concern now is for his wife.  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  She is meeting with her cardiologist again next week to see what possible treatment is for her.  He still thinks there may be some issue with her diaphragm but she is meeting with the pulmonologist also.  He is doing what he can to help her.  His daughter's  birthday is March 11 so he knows that is a difficult time for both he and his wife next week.  Typically they do something to remember her but because of the wife's current health  issues they cannot go to [place] as they had planned so they are talking about what to do to remember and celebrate her birthday.  We will continue to work on coping skills for helping with his anxiety and the progression of his Lewy body dementia diag nosis as well as processing his grief. 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 Speech/Language:  Normal Rate Affect: Appropriate Mood: normal Thought process: normal Thought content:  WNL Sensory/Perceptual disturbances:  WNL Orientation: oriented to person, place, and situation Attention: Good Concentration: Good Memory: Impair ed short term Fund of knowledge:  Good Insight:  Good Judgment:  Good Impulse Control: Good  Risk Assessment: Danger to Self: No Self-injurious Behavior: No Danger to Others: No Duty to Warn:no Physical Aggression / Violence:No  Access to Firearms a concern: No  Gang Involvement:No  Treatment plan: Will employ cognitive behavioral therapy as well as grief and loss therapy for relief of anxiety and grief.  Goals of grief  therapy or to have a healthier response to the loss of his daughter and less sadness as evidenced by patient report in therapy notes as well as to help him feel less overwhelmed by her loss.  Goals are to see a 50% reduction in grief symptoms over the next 6 months.  I will encourage the use of the patient telling his grief story about his daughter including encouraging him to bring pictures and memorabilia to help process his feelings  including any feelings of guilt associated with her loss.  We will use cognitive behavioral therapy to help identify and change anxiety producing thoughts and behavior patterns so as to improve his ability to better manage anxiety and stress, and manage thoughts and worrisome thinking contributing to anxiety.  We will also refresh and encourage dialectical  behavior therapy distress tolerance and mindfulness skills  with the intention of  reducing anxiety by 50% over the next 6 months. Progress: 30% Interventions: Cognitive Behavioral Therapy  Diagnosis:PTSD, Bi-Polar D/O  Plan: F/U/ appointments scheduled weekly.  Cecile Coder, [facility]                                                                                                                   C raig Kelly Patient, [facility]  [place] [facility] Counselor/Therapist Progress Note  Patient ID: Ted Goodner , MRN: [id number],    Date: 04/01/2024 This session was held via video teletherapy. The patient  consented to the video teletherapy and was located in his home during this session. He is aware it is the responsibility of the patient to secure confidentiality on his end of the session. The provider was in a private home office for the duration of this session.   The patient arrived on time for her Caregility session  Time Spent: 58  minutes, 2 PM to 2:58 PM  Treatment Type: Individual Therapy Reported Symptoms: anxiety, panic attacks A fairly difficult week because it was his daughter's birthday.  He said this year was more difficult than last year but he could not pinpoint why.  They did remember her about going out to a nice restaurant which is something her daughter always wanted to do.  He said they were able to laugh and joke about funny things that his dau ghter had said or done which is not something they were able to do this time last year so he saw that as healthy grieving.  He knows that the time change threw things off a little bit for everybody and his wife still is not feeling well and his back has been bothering him.  He was not able to go to his first round of physical therapy this week because his back was bothering him more.  He has been thinking a lot more about his daughter w [place] he feels comfortable with.  He is thankful that it is getting warmer and staying light longer so he can get back to some of the coping skills such as fishing that he has done in the past.   We will continue to work on coping skills for helping with his anxiety and the progression of his Lewy body dementia diagnosis as well as processing his grief. Mental Status Exam: Appearance:  Well Groomed  Behavior: Appropriate Motor:  Pt. Reports some instability due to Lewey Body dementia Speech/Language:  Normal Rate Affect: Appropriate Mood: normal Thought process: normal Thought content:  WNL Sensory/Perceptual disturbances:  WNL Orientation: oriented to person, place, and  situation Attention: Good Concentration: Good Memory: Impaired short term Fund of knowledge:  Good Insight:  Good Judgment:  Good Impulse Control: Good  Risk Assessment: Dan ger to Self: No Self-injurious Behavior: No Danger to Others: No Duty to Warn:no Physical Aggression / Violence:No  Access to Firearms a concern: No  Gang Involvement:No  Treatment plan: Will employ cognitive behavioral therapy as well as grief and loss therapy for relief of anxiety and grief.  Goals of grief therapy or to have a healthier response to the loss of his daughter and less sadness as evidenced by patient report in th erapy notes as well as to help him feel less overwhelmed by her loss.  Goals are to see a 50% reduction in grief symptoms over the next 6 months.  I will encourage the use of the patient telling his grief story about his daughter including encouraging him to bring pictures and memorabilia to help process his feelings including any feelings of guilt associated with her loss.  We will use cognitive behavioral therapy to help identify and  change anxiety producing thoughts and behavior patterns so as to improve his ability to better manage anxiety and stress, and manage thoughts and worrisome thinking contributing to anxiety.  We will also refresh and encourage dialectical behavior therapy  distress tolerance and mindfulness skills with the intention of reducing anxiety by 50% over the next 6 months. Progress: 30% Interventions: Cognitive Behavioral Therapy  Diagnosis: PTSD, Bi-Polar D/O  Plan: F/U/ appointments scheduled weekly.  Cecile Coder, [facility]                                                                                                                   Cecile Coder, [facility]               Cecile Coder, [facility]               Cecile Coder, [facility]  [facility]  [place] [facility] Counselor/Therapist Progress Note  Patient ID: Jeran Hiltz, MRN: [id number],    Date:  04/01/2024 This session was held via video teletherapy. The patient consented to the video teletherapy and was located in his home during this session. He is aware it is the responsibility of the patient to secure confidentiality on his end of the session. The provider was in a private home office for the duration of this session.   The patient arrived on time for her Caregility session  Time Spent: 57 minutes, 2 PM to 2:57 PM  Tr eatment Type: Individual Therapy Reported Symptoms: anxiety, panic attacks The patient is still having significant back pain.  He cannot sit anywhere for very long and has to be  careful where he sits.  There is still some unsteadiness although he did not report any falls over the past 2 weeks.  His biggest concern now is for his wife.  She has been having some difficulty breathing and they did a test so they found a small hole on the  back side of her heart.  She is meeting with her cardiologist again next week to see what possible treatment is for her.  He still thinks there may be some issue with her diaphragm but she is meeting with the pulmonologist also.  He is doing what he can to help her.  His daughter's birthday is March 11 so he knows that is a difficult time for both he and his wife next week.  Typically they do something to remember her but because of t he wife's current health issues they cannot go to [place] as they had planned so they are talking about what to do to remember and celebrate her birthday.  We will continue to work on coping skills for helping with his anxiety and the progression of his Lewy body dementia diagnosis as well as processing his grief. Mental Status Exam: Appearance:  Well Groomed  Behavior: Appropriate Motor: Pt. Reports some instability due t o Lewey Body dementia Speech/Language:  Normal Rate Affect: Appropriate Mood: normal Thought process: normal Thought content:  WNL Sensory/Perceptual disturbances:  WNL Orientation: oriented to person, place, and situation Attention: Good Concentration: Good Memory: Impaired short term Fund of knowledge:  Good Insight:  Good Judgment:  Good Impulse Control: Good  Risk Assessment: Danger to Self: No Self-injurious B ehavior: No Danger to Others: No Duty to Warn:no Physical Aggression / Violence:No  Access to Firearms a concern: No  Gang Involvement:No  Treatment plan: Will employ cognitive behavioral therapy as well as grief and loss therapy for relief of anxiety and grief.  Goals of grief therapy or to have a healthier response to the loss of his daughter and less  sadness as evidenced by patient report in therapy notes as well as to help him  feel less overwhelmed by her loss.  Goals are to see a 50% reduction in grief symptoms over the next 6 months.  I will encourage the use of the patient telling his grief story about his daughter including encouraging him to bring pictures and memorabilia to help process his feelings including any feelings of guilt associated with her loss.  We will use cognitive behavioral therapy to help identify and change anxiety producing thoughts  and behavior patterns so as to improve his ability to better manage anxiety and stress, and manage thoughts and worrisome thinking contributing to anxiety.  We will also refresh and encourage dialectical behavior therapy distress tolerance and mindfulness  skills with the intention of reducing anxiety by 50% over the next 6 months. Progress: 30% Interventions: Cognitive Behavioral Therapy  Diagnosis:PTSD, Bi-Polar D/O  Plan: F/U/ a ppointments scheduled weekly.  Cecile Coder, [facility]                                                                                                                   Cecile Coder, [facility]  [place] [facility] Counselor/Therapist Progress Note  Patient ID: Yahya Boldman, MRN: [id number],    Date: 04/01/2024 This session was held via video teletherapy. The patient consented to the video teletherapy and was located in his  home during this session. He is aware it is the responsibility of the patient to secure confidentiality on his end of the session. The provider was in a private home office for the duration of this session.   The patient arrived on time for her Caregility session  Time Spent: 57 minutes, 2 PM to 2:57 PM  Treatment Type: Individual Therapy Reported Symptoms: anxiety, panic attacks  We will continue to work on coping skills for h elping with his anxiety and the progression of his Lewy body dementia diagnosis as well as processing his grief. 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 Speech/Language:  Normal Rate Affect: Appropriate Mood: normal Thought process: normal Thought content:  WNL Sensory/Perceptual disturbances:  WNL Orientation: oriented to person,  place, and situation Attention: Good Concentration: Good Memory: Impaired short term Fund of knowledge:  Good Insight:  Good Judgment:  Good Impulse Control: Good  Risk  Assessment: Danger to Self: No Self-injurious Behavior: No Danger to Others: No Duty to Warn:no Physical Aggression / Violence:No  Access to Firearms a concern: No  Gang Involvement:No  Treatment plan: Will employ cognitive behavioral therapy as well a s grief and loss therapy for relief of anxiety and grief.  Goals of grief therapy or to have a healthier response to the loss of his daughter and less sadness as evidenced by patient report in therapy notes as well as to help him feel less overwhelmed by her loss.  Goals are to see a 50% reduction in grief symptoms over the next 6 months.  I will encourage the use of the patient telling his grief story about his daughter including encou raging him to bring pictures and memorabilia to help process his feelings including any feelings of guilt associated with her loss.  We will use cognitive behavioral therapy to help identify and change anxiety producing thoughts and behavior patterns so as to improve his ability to better manage anxiety and stress, and manage thoughts and worrisome thinking contributing to anxiety.  We will also refresh and encourage dialectical behavio r therapy distress tolerance and mindfulness skills with the intention of reducing anxiety by 50% over the next 6 months. Progress: 30% Interventions: Cognitive Behavioral Therapy  Diagnosis:PTSD, Bi-Polar D/O  Plan: F/U/ appointments scheduled weekly.  Cecile Coder, [facility]                                                                                                                    Cecile Coder, [facility]               Cecile Coder, [facility]               Cecile Coder, [facility]  Cecile Coder, [facility]               Cecile Coder, [facility]               Cecile Coder,  [facility]               Cecile Coder, [facility]  [facility] Counselor/Therapist Progress Note  Patient ID: Hogan Hoobler, MRN: [id number],    Date: 04/01/2024 This session was held via vide o teletherapy. The patient consented to the video teletherapy and was located in his home during this session. He is aware it is the responsibility of the patient to secure confidentiality on his end of the session. The provider was in a private home office for the duration of this session.   The patient arrived on time for her Caregility session  Time Spent: 39 minutes, 2 PM to 2:39 PM  Treatment Type: Individual Therapy Reporte d Symptoms: anxiety, panic attacks The patient was not feeling well today.  He started  feeling really earlier in the week having a fever bad headaches body aches.  He felt a little better yesterday but feels a little bit of a resurgence in headaches and body aches and is running a low-grade fever.  His wife is feeling some of the same symptoms.  For the most part he says he is doing well.  They did increase his memory medication becaus e they were starting to see some increasing decline in short-term memory and he said until recently his long-term memory had been good but he was starting to see a difference in how he used to markers to remember what happened at certain times historically.  He feels the medication has helped and has brightened his mood a little also.  He is still trying to walk every day and fish when he can and has enjoyed watching the Masters golf to [employer] as a good distraction.  He reports that he feels he is managing mood fairly well.  He has an appointment in 2 weeks.  He does contract for safety having no thoughts of hurting himself or anyone else. We will continue to work on coping skills for helping with his anxiety and the progression of his Lewy body dementia diagnosis as well as processing his grief. Mental Status Exam: Appearance:  Well Groomed  Behavior: Approp riate Motor: Pt. Reports some instability due to Lewey Body dementia Speech/Language:  Normal Rate Affect: Appropriate Mood: normal Thought process: normal Thought content:  WNL Sensory/Perceptual disturbances:  WNL Orientation: oriented to person, place, and situation Attention: Good Concentration: Good Memory: Impaired short term Fund of knowledge:  Good Insight:  Good Judgment:  Good Impulse Control: Good  Risk As sessment: Danger to Self: No Self-injurious Behavior: No Danger to Others: No Duty to Warn:no Physical Aggression / Violence:No  Access to Firearms a concern: No  Gang Involvement:No  Treatment plan: Will employ cognitive behavioral therapy as well as grief and  loss therapy for relief of anxiety and grief.  Goals of grief therapy or to have a healthier response to the loss of his daughter and less sadness as evidenced by patien t report in therapy notes as well as to help him feel less overwhelmed by her loss.  Goals are to see a 50% reduction in grief symptoms over the next 6 months.  I will encourage the use of the patient telling his grief story about his daughter including encouraging him to bring pictures and memorabilia to help process his feelings including any feelings of guilt associated with her loss.  We will use cognitive behavioral therapy to help  identify and change anxiety producing thoughts and behavior patterns so as to improve his ability to better manage anxiety and stress, and manage thoughts and worrisome thinking contributing to anxiety.  We will also refresh  and encourage dialectical behavior therapy distress tolerance and mindfulness skills with the intention of reducing anxiety by 50% over the next 6 months. Progress: 30% Interventions: Cognitive Behavioral Therapy   Diagnosis:PTSD, Bi-Polar D/O  Plan: F/U/ appointments scheduled weekly.  Cecile Coder, [facility]                                                                                                                   Cecile Coder, [facility]  [place] [facility] Counselor/Therapist Progress Note  Patient ID: Paco Cislo, MRN: [id number],    Date: 04/01/2024 This session was held via video teletherapy. The patient consented  to the video teletherapy and was located in his home during this session. He is aware it is the responsibility of the patient to secure confidentiality on his end of the session. The provider was in a private home office for the duration of this session.   The patient arrived on time for her Caregility session  Time Spent: 58 minutes, 2 PM to 2:58 PM  Treatment Type: Individual Therapy Reported Symptoms: anxiety, panic attacks A  fairly difficult week because it was his daughter's birthday.  He said this year was more difficult than last year but he could not pinpoint why.  They did remember her about going out to a nice restaurant which is something her daughter always wanted to do.  He said they were able to laugh and joke about funny things that his daughter had said or done which is not something they were able to do this time last year so he saw that as he althy grieving.  He knows that the time change threw things off a little bit for everybody and his wife still is not feeling well and his back has been bothering him.  He was not able  to go to his first round of physical therapy this week because his back was bothering him more.  He has been thinking a lot more about his daughter which he feels comfortable with.  He is thankful that it is getting warmer and staying light longer so he ca n get back to some of the coping skills such as fishing that he has done in the past.   We will continue to work on coping skills for helping with his anxiety and the progression of his Lewy body dementia diagnosis as well as processing his grief. 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 Speech/Language:  Normal Rate Affect: Appropriate  Mood: normal Thought process: normal Thought content:  WNL Sensory/Perceptual disturbances:  WNL Orientation: oriented to person, place, and situation Attention: Good Concentration: Good Memory: Impaired short term Fund of knowledge:  Good Insight:  Good Judgment:  Good Impulse Control: Good  Risk Assessment: Danger to Self: No Self-injurious Behavior: No Danger to Others: No Duty to Warn:no Physical Aggression /  Violence:No  Access to Firearms a concern: No  Gang Involvement:No  Treatment plan: Will employ cognitive behavioral therapy as well as grief and loss therapy for relief of anxiety and grief.  Goals of grief therapy or to have a healthier response to the loss of his daughter and less sadness as evidenced by patient report in therapy notes as well as to help him feel less overwhelmed by her loss.  Goals are to see a 50% reduction in  grief symptoms over the next 6 months.  I will encourage the use of the patient telling his grief story about his daughter including encouraging him to bring pictures and memorabilia to help process his feelings including any feelings of guilt associated with her loss.  We will use cognitive behavioral therapy to help identify and change anxiety producing thoughts  and behavior patterns so as to improve his ability to better manage anxie ty and stress, and manage thoughts and worrisome thinking contributing to anxiety.  We will also refresh and encourage dialectical behavior therapy  distress tolerance and mindfulness skills with the intention of reducing anxiety by 50% over the next 6 months. Progress: 30% Interventions: Cognitive Behavioral Therapy  Diagnosis:PTSD, Bi-Polar D/O  Plan: F/U/ appointments scheduled weekly.  Cecile Coder, [facility]                                                                                                                    Cecile Coder, [facility]               Cecile Coder, [facility]               Cecile Coder, [facility]  [place] [facility] Counselor/Therapist Progress Note  Patient ID: Varick Keys, MRN: [id number],    Date: 04/01/2024 This session was held via video teletherapy. The patient consented to the video teletherapy and was located  in his home during this session. He is aware it is the responsibility of the patient to secure confidentiality on his end of the session. The provider was in a private home office for the duration of this session.   The patient arrived on time for her Caregility session  Time Spent: 1:05 PM until 2 PM, 55 minutes Treatment Type: Individual Therapy Reported Symptoms: anxiety, panic attacks The patient said he came in from his [place] this morning and saw what he thought was some sort of tic from his wife.  It was not the first time it happened and he wonders if it might be tardive dyskinesia because of some new medications that she is on.  She has a call into the doctor right now and hopes to hear something back soon.  They have narrowed down to the fact they think most of her discomfort in breathing is coming from the diaphragm and they are looking for alternativ es for treatment.  The patient continues to do those things which help him cope including walking, fishing when he can, spending time with his son and daughter-in-law and grandson.  He also has a way of continued coping with grief he is writing letters to his daughters as a way of expressing his feelings and says that is very beneficial.  He appears to be grieving in a healthy way but he knows that he to year anniversary of her death  is coming up and there is some grief there but says it is not overwhelming.  We will continue to work on coping skills for helping with his anxiety and the progression of his Lewy  body dementia diagnosis as well as processing his grief. 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 Speech/Language:  Normal Rate Affect: Appropriate Mood: norm al Thought process: normal Thought content:  WNL Sensory/Perceptual disturbances:  WNL Orientation: oriented to person, place, and situation Attention: Good Concentration: Good Memory: Impaired short term Fund of knowledge:  Good Insight:  Good Judgment:  Good Impulse Control: Good  Risk Assessment: Danger to Self: No Self-injurious Behavior: No Danger to Others: No Duty to Warn:no Physical Aggression / Violence:No   Access to Firearms a concern: No  Gang Involvement:No  Treatment plan: Will employ cognitive behavioral therapy as well as grief and loss therapy for relief of anxiety and grief.  Goals of grief therapy or to have a healthier response to the loss of his daughter and less sadness as evidenced by patient report in therapy notes as well as to help him feel less overwhelmed by her loss.  Goals are to see a 50% reduction in grief sympto ms over the next 6 months.  I will encourage the use of the patient telling his grief story about his daughter including encouraging him to bring pictures and memorabilia to help process his feelings including any feelings of guilt associated with her loss.  We will use cognitive behavioral therapy to help identify and change anxiety producing thoughts and behavior patterns so as to improve his ability to better manage anxiety and stres s, and manage thoughts and worrisome thinking contributing to anxiety.  We will also refresh and encourage dialectical behavior therapy distress tolerance and  mindfulness skills with the intention of reducing anxiety by 50% over the next 6 months. Progress: 30% Interventions: Cognitive Behavioral Therapy  Diagnosis:PTSD, Bi-Polar D/O  Plan: F/U/ appointments  scheduled weekly.  Cecile Coder, [facility]                                                                                                                    Cecile Coder, [facility]  [place] [facility] Counselor/Therapist Progress Note  Patient ID: Yona Stansbury, MRN: [id number],    Date: 04/01/2024 This session was held via video teletherapy. The patient consented to the video teletherapy and was located in his home during this session. He is aware it is the responsibility of the patient to secu re confidentiality on his end of the session. The provider was in a private home office for the duration of  this session.   The patient arrived on time for her Caregility session  Time Spent: 59 minutes, 2 PM to 2:59 PM  Treatment Type: Individual Therapy Reported Symptoms: anxiety, panic attacks The patient continues to present with physical pain and both his back and his knee.  He started with a new physical therapist and told  him that there was a lot of arthritis in both knees and or certain things he cannot do but they had him do that anyway and now for the past 3 days he has been in significant knee pain.  He has continued to walk because he knows that is good for him in various ways but says it has been painful.  His wife also was not feeling well but she is going back to the doctor next week to have an ablation and hopes that will bring her some relief.   For the most part he has been at the last couple weeks reaching back out and hearing from some old friends as well as time with his son and son's family.  He says that his wife's cousin is coming down for a weekend soon and they enjoy time with her and that he will go to his brother's house sometime in the next few weeks.  He recognizes the need for people that he cares about in helping with his depression. We will continue to work o n Pharmacologist for helping with his anxiety and the progression of his Lewy body dementia diagnosis as well as processing his grief. 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 Speech/Language:  Normal Rate Affect: Appropriate Mood: normal Thought process: normal Thought content:  WNL Sensory/Perceptual disturbances:  WNL Orientation:  oriented to person, place, and situation Attention: Good Concentration: Good Memory: Impaired short term Fund of knowledge:  Good Insight:  Good Judgment:  Good Impulse Control: Good  Risk Assessment: Danger to Self: No Self-injurious Behavior: No Danger to Others: No Duty to  Warn:no Physical Aggression / Violence:No  Access to Firearms a concern: No  Gang Involvement:No  Treatment plan: Will employ cognitive behavio ral therapy as well as grief and loss therapy for relief of anxiety and grief.  Goals of grief therapy or to have a healthier response to the loss of his daughter and less sadness as evidenced by patient report in therapy notes as well as to help him feel less overwhelmed by her loss.  Goals are to see a 50% reduction in grief symptoms over the next 6 months.  I will encourage the use of the patient telling his grief story about his dau ghter including encouraging him to bring pictures and memorabilia to help process his feelings including any feelings of guilt associated with her loss.  We will use cognitive behavioral therapy to help identify and change anxiety producing thoughts and behavior patterns so as to improve his ability to better manage anxiety and stress, and manage thoughts and worrisome thinking contributing to anxiety.  We will also  refresh and encourag e dialectical behavior therapy distress tolerance and mindfulness skills with the intention of reducing anxiety by 50% over the next 6 months. Progress: 30% Interventions: Cognitive Behavioral Therapy  Diagnosis:PTSD, Bi-Polar D/O  Plan: F/U/ appointments scheduled weekly.  Cecile Coder, [facility]                                                                                                                    Cecile Coder, [facility]               Cecile Coder, [facility]               Cecile Coder, [facility]               Cecile Coder, [facility]  [place] [facility] Counselor/Therapist Progress Note  Patient ID: Onesimo Lingard, MRN: [id number],    Date: 04/01/2024 This session was held via video teletherapy. The patient consented to the video teletherapy and was located in his home during this session. He is aware it is the  responsibility of the patient to secure confidentiality on his end of the session. The provider was in a private home office for the duration of this session.   The patient arrived on time for her Caregility session  Time Spent: 57 minutes, 2 PM to 2:57 PM  Treatment Type: Individual Therapy Reported Symptoms: anxiety, panic attacks The patient is still having significant back pain.  He cannot sit anywhere for very long and has  to be careful where he sits.  There is still some unsteadiness although he did not report any falls over the past 2 weeks.  His biggest concern now is for his wife.  She has been  having some difficulty breathing and they did a test so they found a small hole on the back side of her heart.  She is meeting with her cardiologist again next week to see what possible treatment is for her.  He still thinks there may be some issue with her di aphragm but she is meeting with the pulmonologist also.  He is doing what he can to help her.  His daughter's birthday is March 11 so he knows that is a difficult time for both he and his wife next week.  Typically they do something to remember her but because of the wife's current health issues they cannot go to [place] as they had planned so they are talking about what to do to remember and celebrate her birthday.  We will c ontinue to work on Pharmacologist for helping with his anxiety and the progression of his Lewy body dementia diagnosis as well as processing his grief. 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 Speech/Language:  Normal Rate Affect: Appropriate Mood: normal Thought process: normal Thought content:  WNL Sensory/Perceptual disturbances:   WNL Orientation: oriented to person, place, and situation Attention: Good Concentration: Good Memory: Impaired short term Fund of knowledge:  Good Insight:  Good Judgment:  Good Impulse Control: Good  Risk Assessment: Danger to Self: No Self-injurious Behavior: No Danger to Others: No Duty to Warn:no Physical Aggression / Violence:No  Access to Firearms a concern: No  Gang Involvement:No  Treatment plan: Will employ  cognitive behavioral therapy as well as grief and loss therapy for relief of anxiety and grief.  Goals of grief therapy or to have a healthier response to the loss of his daughter and less sadness as evidenced by patient report in therapy notes as well as to help him feel less overwhelmed by her loss.  Goals are to see a 50% reduction in grief symptoms over the  next 6 months.  I will encourage the use of the patient telling his grief st ory about his daughter including encouraging him to bring pictures and memorabilia to help process his feelings including any feelings of guilt associated with her loss.  We will use cognitive behavioral therapy to help identify and change anxiety producing thoughts and behavior patterns so as to improve his ability to better manage anxiety and stress, and manage thoughts and worrisome thinking contributing to anxiety.  We will also ref resh and encourage dialectical behavior therapy distress tolerance and mindfulness  skills with the intention of reducing anxiety by 50% over the next 6 months. Progress: 30% Interventions: Cognitive Behavioral Therapy  Diagnosis:PTSD, Bi-Polar D/O  Plan: F/U/ appointments scheduled weekly.  Cecile Coder, [facility]                                                                                                                    Cecile Coder, [facility]  [place] [facility] Counselor/Therapist Progress Note  Patient ID: Toshio Slusher, MRN: [id number],    Date: 04/01/2024 This session was held via video teletherapy. The patient consented to the video teletherapy and was located in his home during this session. He is aware it is the responsibility of the patient to secure confidentiality on his end of the session. The provider was in a private home office  for the duration of this session.   The patient arrived on time for her Caregility session  Time Spent: 58 minutes, 2 PM to 2:58 PM  Treatment Type: Individual Therapy Reported Symptoms: anxiety, panic attacks A fairly difficult week because it was his daughter's birthday.  He said this year was more difficult than last year but he could not pinpoint why.  They did remember her about going out to a nice restaurant which is somet hing her daughter always wanted to do.  He said they were able to laugh and joke about funny things that his daughter had said or done which is not something they were able to do this time last year so he saw that as healthy grieving.  He knows that the time change threw things off a little bit for everybody and his wife still is not feeling well and his back has been bothering him.  He was not able to go to his first round of physical  therapy this week because his back was bothering him more.  He has been thinking a lot more about his daughter which he feels comfortable with.  He is thankful that it is getting warmer  and staying light longer so he can get back to some of the coping skills such as fishing that he has done in the past.   We will continue to work on coping skills for helping with his anxiety and the progression of his Lewy body dementia diagnosis as  well as processing his grief. 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 Speech/Language:  Normal Rate Affect: Appropriate Mood: normal Thought process: normal Thought content:  WNL Sensory/Perceptual disturbances:  WNL Orientation: oriented to person, place, and situation Attention: Good Concentration: Good Memory: Impaired short  term Fund of knowledge:  Good Insight:  Good Judgment:  Good Impulse Control: Good  Risk Assessment: Danger to Self: No Self-injurious Behavior: No Danger to Others: No Duty to Warn:no Physical Aggression / Violence:No  Access to Firearms a concern: No  Gang Involvement:No  Treatment plan: Will employ cognitive behavioral therapy as well as grief and loss therapy for relief of anxiety and grief.  Goals of grief therapy o r to have a healthier response to the loss of his daughter and less sadness as evidenced by patient report in therapy notes as well as to help him feel less overwhelmed by her loss.  Goals are to see a 50% reduction in grief symptoms over the next 6 months.  I will encourage the use of the patient telling his grief story about his daughter including encouraging him to bring pictures and memorabilia to help process his feelings including  any feelings of guilt associated with her loss.  We will use cognitive behavioral therapy to help identify and change anxiety producing thoughts and behavior patterns so as to improve his ability to better manage anxiety and stress, and manage thoughts and worrisome thinking contributing to anxiety.  We will also refresh and encourage dialectical behavior therapy  distress  tolerance and mindfulness skills with the intention of reducing  anxiety by 50% over the next 6 months. Progress: 30% Interventions: Cognitive Behavioral Therapy  Diagnosis:PTSD, Bi-Polar D/O  Plan: F/U/ appointments scheduled weekly.  Cecile Coder, [facility]                                                                                                                   Jone Neither Pe ters, [facility]               Cecile Coder, [facility]               Cecile Coder, [facility]  [place] [facility] Counselor/Therapist Progress Note  Patient ID: Hubert Raatz, MRN: [id number],    Date:  04/01/2024 This session was held via video teletherapy. The patient consented to the video teletherapy and was located in his home during this session. He is aware it is the responsibility of the patient to secure confidentiality on his end of the session. The provider was in a private home office for the duration of this sess ion.   The patient arrived on time for her Caregility session  Time Spent: 57 minutes, 2 PM to 2:57 PM  Treatment Type: Individual Therapy Reported Symptoms: anxiety, panic attacks The patient is still having significant back pain.  He cannot sit anywhere for very long and has to be careful where he sits.  There is still some unsteadiness although he did not report any falls over the past 2 weeks.  His biggest concern now is for  his wife.  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  She is meeting with her cardiologist again next week to see what possible treatment is for her.  He still thinks there may be some issue with her diaphragm but she is meeting with the pulmonologist also.  He is doing what he can to help her.  His daughter's birthday is March 11 so he knows that is a  difficult time for both he and his wife next week.  Typically they do something to remember her but because of the wife's current health issues they cannot go to [place] as they had planned so they are talking about what to do to remember and celebrate her birthday.  We will continue to work on coping skills for helping with his anxiety and the progression of his Lewy body dementia diagnosis as well as processing his grief. Men tal Status Exam: Appearance:  Well Groomed  Behavior: Appropriate Motor: Pt. Reports some instability due to Lewey Body dementia Speech/Language:  Normal Rate Affect: Appropriate Mood: normal Thought process: normal Thought content:  WNL Sensory/Perceptual disturbances:   WNL Orientation: oriented to person, place, and situation Attention: Good Concentration: Good Memory: Impaired short term Fund of knowledge:  Good In sight:  Good Judgment:  Good Impulse Control: Good  Risk Assessment: Danger to Self: No Self-injurious Behavior: No Danger to Others: No Duty to Warn:no Physical Aggression / Violence:No  Access to Firearms a concern: No  Gang Involvement:No  Treatment plan: Will employ cognitive behavioral therapy as well as grief and loss therapy for relief of anxiety and grief.  Goals of grief therapy or to have a healthier response to  the loss of his daughter and less sadness as evidenced by patient report in therapy notes as well as to help him feel less overwhelmed by her loss.  Goals are to see a 50% reduction in grief symptoms over the next 6 months.  I will encourage the use of the patient telling his grief story about his daughter including encouraging him to bring pictures and memorabilia to help process his feelings including any feelings of guilt associated  with her loss.  We will use cognitive behavioral therapy to help identify and change anxiety producing thoughts and behavior patterns so as to improve his ability to better manage anxiety and stress, and manage thoughts and worrisome thinking contributing to anxiety.  We will also refresh and encourage dialectical behavior therapy distress tolerance and mindfulness  skills with the intention of reducing anxiety by 50% over the next 6 mon ths. Progress: 30% Interventions: Cognitive Behavioral Therapy  Diagnosis:PTSD, Bi-Polar D/O  Plan: F/U/ appointments scheduled weekly.  Cecile Coder, [facility]                                                                                                                   Cecile Coder, [facility]  [place] [facility] Counselor/Therapist Progress Note  Patient ID: Munachimso Palin, MRN: [id number],    Date: 04/01/2024 Thi s session was held via video teletherapy. The patient consented to the video teletherapy and was located in his home during this session. He is aware it is the responsibility of the patient to secure confidentiality on his end of the session. The provider was in a private home office for the duration of this session.   The patient arrived on time for her Caregility session  Time Spent: 57 minutes, 2 PM to 2:57 PM  Treatment Type:  Individual Therapy Reported Symptoms: anxiety, panic attacks  The patient reported that he had not been sleeping well so he did speak with his psychiatrist about adjusting some meds.   Initially they adjusted too much up and he said he felt groggy for the entire next day but they have found a combination now where he feels like he is getting more rested and only feels a little drowsy shortly after waking up..  There have been a couple  times where he stumbled and one of the times he felt down but did not get hurt the other times he was able to catch himself.  He is trying to stay as active as he can walking daily and fishing with his wife when he can.  His biggest anxiety now is his wife has an upcoming surgery on her diaphragm in 2 weeks so he knows that will be a fairly significant recovery time but also sees the difficulties she is having now and wants her to get  some relief..  His son and daughter-in-law will be supports for them also.  He continues to speak with friends and family members as encouragement.  He also feels that he is grieving appropriately writing to his daughter on a regular basis about the things that are going on in his and his wife's lives.  We will continue to work on coping skills for helping with his anxiety and the progression of his Lewy body dementia diagnosis as wel l as processing his grief. 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 Speech/Language:  Normal Rate Affect: Appropriate Mood: normal Thought process: normal Thought content:  WNL Sensory/Perceptual disturbances:  WNL Orientation: oriented to person, place, and situation Attention: Good Concentration: Good Memory: Impaired short ter m Fund of knowledge:  Good Insight:  Good Judgment:  Good Impulse Control: Good  Risk Assessment: Danger to Self: No Self-injurious Behavior: No Danger to Others: No Duty to Warn:no Physical Aggression / Violence:No  Access to Firearms a concern: No  Gang Involvement:No  Treatment plan: Will employ cognitive behavioral therapy as well as grief and loss  therapy for relief of anxiety and grief.  Goals of grief therapy or t o have a healthier response to the loss of his daughter and less sadness as evidenced by patient report in therapy notes as well as to help him feel less overwhelmed by her loss.  Goals are to see a 50% reduction in grief symptoms over the next 6 months.  I will encourage the use of the patient telling his grief story about his daughter including encouraging him to bring pictures and memorabilia to help process his feelings including an y feelings of guilt associated with her loss.  We will use cognitive behavioral therapy to help identify and change anxiety producing thoughts and behavior patterns so as to improve his ability  to better manage anxiety and stress, and manage thoughts and worrisome thinking contributing to anxiety.  We will also refresh and encourage dialectical behavior therapy distress tolerance and mindfulness skills with the intention of reducing anx iety by 50% over the next 6 months. Progress: 30% Interventions: Cognitive Behavioral Therapy  Diagnosis:PTSD, Bi-Polar D/O  Plan: F/U/ appointments scheduled weekly.  Cecile Coder, [facility]                                                                                                                   Scarlett Current s, [facility]               Cecile Coder, [facility]               Cecile Coder, [facility]               Cecile Coder, [facility]               Cecile Coder, [facility]               Cecile Coder, [facility]               Cecile Coder, [facility]               Cecile Coder, [facility]  [place] [facility] Counselor/Therapist Progress Note  Patient ID: Joeziah Voit, MRN: [id number],    Date: 04/01/2024 This session was held via video teletherapy. The patient consente d to the video teletherapy and was located in his home during this session. He is aware it is the responsibility of the patient to secure confidentiality on his end of the session. The provider was in a private home office for the duration of this session.   The patient arrived on time for her Caregility session  Time Spent: 59 minutes, 2 PM to 2:59 PM  Treatment Type: Individual Therapy Reported Symptoms: anxiety, panic attacks  The patient said he jokingly said to his wife that he had not had any pain issues in the cervical area and then a few days ago he started having pain in his neck and shoulders.  He now  sits related to degenerative disk and has some consistent pain somewhere in his spine with that but jokingly says it just depends on Wednesday which part of his body is decides to show up and hurt.  He is doing the best that he can.  He is still walking  which he knows is good for him but does not have right now the strength to do as much in the morning as he has done in the past so he is denying that to 3 times a day for consistency.  He is still doing some positive things and that he is staying in touch with his brother and other family members.  He is reconnecting with friends from high school and he is which she is enjoying.  One of them has had a similar experience in that she lost  her son in the same way the patient lost his daughter.  He said they did not think and Easter in which they set a place for his daughter at the table and put her picture there.  He initially had reservations about doing that he and his wife had made everyone feel as if she was really there with him.  He still feels that he is grieving in a healthy way.  He and his wife have always taken a trip around both of their birthdays which are 2  days apart.  That is now close to his daughter died so they have started taking some of her rashes with him and sprinkling them where they go as a way of remembrance.  They are deciding what that looks like this year in July.  He does contract for safety having no thoughts of hurting himself or anyone else. We will continue to work on coping skills for helping with his anxiety and the progression of his Lewy body dementia diagnosis  as well as processing his grief. 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 Speech/Language:  Normal Rate Affect: Appropriate Mood: normal Thought process: normal Thought content:  WNL Sensory/Perceptual disturbances:  WNL Orientation: oriented to person, place, and  situation Attention: Good Concentration: Good Memory: Impaired sho rt term Fund of knowledge:  Good Insight:  Good Judgment:  Good Impulse Control: Good  Risk Assessment: Danger to Self: No Self-injurious Behavior: No Danger to Others: No Duty to Warn:no Physical Aggression / Violence:No  Access to Firearms a concern: No  Gang Involvement:No  Treatment plan: Will employ cognitive behavioral therapy as well as grief and loss therapy for relief of anxiety and grief.  Goals of grief therap  y or to have a healthier response to the loss of his daughter and less sadness as evidenced by patient report in therapy notes as well as to help him feel less overwhelmed by her loss.  Goals are to see a 50% reduction in grief symptoms over the next 6 months.  I will encourage the use of the patient telling his grief story about his daughter including encouraging him to bring pictures and memorabilia to help process his feelings includ ing any feelings of guilt associated with her loss.  We will use cognitive behavioral therapy to help identify and change anxiety producing thoughts and behavior patterns so as to improve his ability to better manage anxiety and stress, and manage thoughts and worrisome thinking contributing to anxiety.  We will also refresh and encourage dialectical behavior therapy distress tolerance and mindfulness skills with the intention of reduci ng anxiety by 50% over the next 6 months. Progress: 30% Interventions: Cognitive Behavioral Therapy  Diagnosis:PTSD, Bi-Polar D/O  Plan: F/U/ appointments scheduled weekly.  Cecile Coder, [facility]                                                                                                                   Cecile Coder,  [facility]  [place] [facility] Counselor/Therapist Progress Note  Patient ID: Chawn Spraggins, MRN:  [id number],    Date: 04/01/2024 This session was held via video teletherapy. The patient consented to the video teletherapy and was located in his home during this session. He is aware it is the responsibility of the patient to secure confidentiality on his end of the session. The provider was in a private home office for the duration of this session.   The patient arrived on time for her Caregility session  Time Spent: 58 minute s, 2 PM to 2:58 PM  Treatment Type: Individual Therapy Reported Symptoms: anxiety, panic attacks A fairly difficult week because it was his daughter's birthday.  He said this year was more  difficult than last year but he could not pinpoint why.  They did remember her about going out to a nice restaurant which is something her daughter always wanted to do.  He said they were able to laugh and joke about funny things that his daughter  had said or done which is not something they were able to do this time last year so he saw that as healthy grieving.  He knows that the time change threw things off a little bit for everybody and his wife still is not feeling well and his back has been bothering him.  He was not able to go to his first round of physical therapy this week because his back was bothering him more.  He has been thinking a lot more about his daughter which h e feels comfortable with.  He is thankful that it is getting warmer and staying light longer so he can get back to some of the coping skills such as fishing that he has done in the past.   We will continue to work on coping skills for helping with his anxiety and the progression of his Lewy body dementia diagnosis as well as processing his grief. Mental Status Exam: Appearance:  Well Groomed  Behavior: Appropriate Motor: Pt. Re ports some instability due to Lewey Body dementia Speech/Language:  Normal Rate Affect: Appropriate Mood: normal Thought process: normal Thought content:  WNL Sensory/Perceptual disturbances:  WNL Orientation: oriented to person, place, and situation Attention: Good Concentration: Good Memory: Impaired short term Fund of knowledge:  Good Insight:  Good Judgment:  Good Impulse Control: Good  Risk Assessment: Danger to  Self: No Self-injurious Behavior: No Danger to Others: No Duty to Warn:no Physical Aggression / Violence:No  Access to Firearms a concern: No  Gang Involvement:No  Treatment plan: Will employ cognitive behavioral therapy as well as grief and loss therapy for relief of anxiety and grief.  Goals of grief therapy or to have a healthier response to the loss of his  daughter and less sadness as evidenced by patient report in therapy  notes as well as to help him feel less overwhelmed by her loss.  Goals are to see a 50% reduction in grief symptoms over the next 6 months.  I will encourage the use of the patient telling his grief story about his daughter including encouraging him to bring pictures and memorabilia to help process his feelings including any feelings of guilt associated with her loss.  We will use cognitive behavioral therapy to help identify and change  anxiety producing thoughts and behavior patterns so as to improve his ability to better manage anxiety and stress, and manage thoughts and worrisome thinking contributing to anxiety.  We will also refresh and encourage dialectical behavior therapy  distress tolerance and mindfulness skills with the intention of reducing anxiety by 50% over the next 6 months. Progress: 30% Interventions: Cognitive Behavioral Therapy  Diagnosis:PTSD,  Bi-Polar D/O  Plan: F/U/ appointments scheduled weekly.  Cecile Coder, [facility]                                                                                                                   Cecile Coder, [facility]               Cecile Coder, [facility]               Cecile Coder, [facility]  [place] [facility] Counselor/Therapist Progress Note  Patient ID: Cai Anfinson, MRN: [id number],    Date: 04/01/2024 This se ssion was held via video teletherapy. The patient consented to the video teletherapy and was located in his home during this session. He is aware it is the responsibility of the patient to secure confidentiality on his end of the session. The provider was in a private home office for the duration of this session.   The patient arrived on time for her Caregility session  Time Spent: 1:05 PM until 2 PM, 55 minutes Treatment Type: In dividual Therapy Reported Symptoms: anxiety, panic attacks The patient said he came in from his walk this morning and saw what he thought was some sort of tic from his wife.  It was not the first time it happened and he wonders if it might be tardive dyskinesia because of some new medications that she is on.  She has a call into the doctor right now and hopes to hear something back soon.  They have narrowed down to the fact they think  most of her discomfort in breathing is coming from the diaphragm and they are looking for alternatives for treatment.  The patient continues to do those things which help him cope  including walking, fishing when he can, spending time with his son and daughter-in-law and grandson.  He also has a way of continued coping with grief he is writing letters to his daughters as a way of expressing his feelings and says that is very beneficia l.  He appears to be grieving in a healthy way but he knows that he to year anniversary of her death is coming up and there is some grief there but says it is not overwhelming.  We will continue to work on coping skills for helping with his anxiety and the progression of his Lewy body dementia diagnosis as well as processing his grief. Mental Status Exam: Appearance:  Well Groomed  Behavior: Appropriate Motor: Pt. Reports some i nstability due to Lewey Body dementia Speech/Language:  Normal Rate Affect: Appropriate Mood: normal Thought process: normal Thought content:  WNL Sensory/Perceptual disturbances:  WNL Orientation: oriented to person, place, and situation Attention: Good Concentration: Good Memory: Impaired short term Fund of knowledge:  Good Insight:  Good Judgment:  Good Impulse Control: Good  Risk Assessment: Danger to Self: No  Self-injurious Behavior: No Danger to Others: No Duty to Warn:no Physical Aggression / Violence:No  Access to Firearms a concern: No  Gang Involvement:No  Treatment plan: Will employ cognitive behavioral therapy as well as grief and loss therapy for relief of anxiety and grief.  Goals of grief therapy or to have a healthier response to the loss of his daughter and less sadness as evidenced by patient report in therapy notes as wel l as to help him feel less overwhelmed by her loss.  Goals are to see a 50% reduction in grief symptoms over the next 6 months.  I will encourage the use of the patient telling his grief story about his daughter including encouraging him to bring pictures and memorabilia to help process his feelings including any feelings of guilt associated with her loss.  We will  use cognitive behavioral therapy to help identify and change anxiety pro ducing thoughts and behavior patterns so as to improve his ability to better manage anxiety and stress, and manage thoughts and worrisome thinking contributing to anxiety.  We will also refresh and encourage dialectical behavior therapy distress tolerance  and mindfulness skills with the intention of reducing anxiety by 50% over the next 6 months. Progress: 30% Interventions: Cognitive Behavioral Therapy  Diagnosis:PTSD, Bi-Polar D/O   Plan: F/U/ appointments scheduled weekly.  Cecile Coder, [facility]                                                                                                                   Cecile Coder, [facility]  [place] [facility] Counselor/Therapist Progress Note  Patient ID: Eugune Sine, MRN: [id number],    Date: 04/01/2024 This session was held via video teletherapy. The patient consented to the video teletherapy and was  located in his home during this session. He is aware it is the responsibility of the patient to secure confidentiality on his end of the session. The provider was in a private home office for the duration of this session.   The patient arrived on time for her Caregility session  Time Spent: 59 minutes, 2 PM to 2:59 PM  Treatment Type: Individual Therapy Reported Symptoms: anxiety, panic attacks The patient continues to present  with physical pain and both his back and his knee.  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  He has continued to walk because he knows that is good for him in various ways but says it has been painful.  His wife also was not feeling well but she  is going back to the doctor next week to have an ablation and hopes that will bring her some relief.  For the most part he has been at the last couple weeks reaching back out and hearing from some old friends as well as time with his son and son's family.  He says that his wife's cousin is coming down for a weekend soon and they enjoy time with her and that he will go to his brother's house sometime in the next few weeks.  He recognize s the need for people that he cares about in helping with his depression. We will continue to work on coping skills for helping with his anxiety and the progression of his Lewy body  dementia diagnosis as well as processing his grief. 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 Speech/Language:  Normal Rate Affect: Appropriate Mood: normal  Thought process: normal Thought content:  WNL Sensory/Perceptual disturbances:  WNL Orientation: oriented to person, place, and situation Attention: Good Concentration: Good Memory: Impaired short term Fund of knowledge:  Good Insight:  Good Judgment:  Good Impulse Control: Good  Risk Assessment: Danger to Self: No Self-injurious Behavior: No Danger to Others: No Duty to Warn:no Physical Aggression / Violence:No  A ccess to Firearms a concern: No  Gang Involvement:No  Treatment plan: Will employ cognitive behavioral therapy as well as grief and loss therapy for relief of anxiety and grief.  Goals of grief therapy or to have a healthier response to the loss of his daughter and less sadness as evidenced by patient report in therapy notes as well as to help him feel less overwhelmed by her loss.  Goals are to see a 50% reduction in grief symptoms o ver the next 6 months.  I will encourage the use of the patient telling his grief story about his daughter including encouraging him to bring pictures and memorabilia to help process his feelings including any feelings of guilt associated with her loss.  We will use cognitive behavioral therapy to help identify and change anxiety producing thoughts and behavior patterns so as to improve his ability to better manage anxiety and stress, a nd manage thoughts and worrisome thinking contributing to anxiety.  We will also  refresh and encourage dialectical behavior therapy distress tolerance and mindfulness skills with the intention of reducing anxiety by 50% over the next 6 months. Progress: 30% Interventions: Cognitive Behavioral Therapy  Diagnosis:PTSD, Bi-Polar D/O  Plan: F/U/ appointments  scheduled weekly.  Cecile Coder, [facility]                                                                                                                    Cecile Coder, [facility]               Cecile Coder, [facility]               Cecile Coder, [facility]               Cecile Coder, [facility]  [place] [facility] Counselor/Therapist Progress Note  Patient ID: Arthor Gorter, MRN: [id number],    Date: 04/01/2024 This session was held via video teletherapy. The patient cons ented to the video teletherapy and was located in his home during this session. He is aware it is the  responsibility of the patient to secure confidentiality on his end of the session. The provider was in a private home office for the duration of this session.   The patient arrived on time for her Caregility session  Time Spent: 57 minutes, 2 PM to 2:57 PM  Treatment Type: Individual Therapy Reported Symptoms: anxiety, panic atta cks The patient is still having significant back pain.  He cannot sit anywhere for very long and has to be careful where he sits.  There is still some unsteadiness although he did not report any falls over the past 2 weeks.  His biggest concern now is for his wife.  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  She is meeting with her cardiologist again next  week to see what possible treatment is for her.  He still thinks there may be some issue with her diaphragm but she is meeting with the pulmonologist also.  He is doing what he can to help her.  His daughter's birthday is March 11 so he knows that is a difficult time for both he and his wife next week.  Typically they do something to remember her but because of the wife's current health issues they cannot go to [place] as they  had planned so they are talking about what to do to remember and celebrate her birthday.  We will continue to work on coping skills for helping with his anxiety and the progression of his Lewy body dementia diagnosis as well as processing his grief. 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 Speech/Language:  Normal Rate Affect: Appropriat e Mood: normal Thought process: normal Thought content:  WNL Sensory/Perceptual disturbances:  WNL Orientation: oriented to person, place, and situation Attention: Good Concentration: Good Memory: Impaired short term Fund of knowledge:  Good Insight:  Good Judgment:   Good Impulse Control: Good  Risk Assessment: Danger to Self: No Self-injurious Behavior: No Danger to Others: No Duty to Warn:no Physical Aggression  / Violence:No  Access to Firearms a concern: No  Gang Involvement:No  Treatment plan: Will employ cognitive behavioral therapy as well as grief and loss therapy for relief of anxiety and grief.  Goals of grief therapy or to have a healthier response to the loss of his daughter and less sadness as evidenced by patient report in therapy notes as well as to help him feel less overwhelmed by her loss.  Goals are to see a 50% reduction in  grief symptoms over the next 6 months.  I will encourage the use of the patient telling his grief story about his daughter including encouraging him to bring pictures and memorabilia to help process his feelings including any feelings of guilt associated with her loss.  We will use cognitive behavioral therapy to help identify and change anxiety producing thoughts and behavior patterns so as to improve his ability to better manage anxi ety and stress, and manage thoughts and worrisome thinking contributing to anxiety.  We will also refresh and encourage dialectical behavior therapy distress tolerance and mindfulness  skills with the intention of reducing anxiety by 50% over the next 6 months. Progress: 30% Interventions: Cognitive Behavioral Therapy  Diagnosis:PTSD, Bi-Polar D/O  Plan: F/U/ appointments scheduled weekly.  Cecile Coder, [facility]                                                                                                                    Cecile Coder, [facility]  [place] [facility] Counselor/Therapist Progress Note  Patient ID: Wilmon Conover, MRN: [id number],    Date: 04/01/2024 This session was held via video teletherapy. The patient consented to the video teletherapy and was located in his home during this session. He is aware it is the responsibility of the pa tient to secure confidentiality on his end of the session. The provider was in a private home office for the duration of this session.   The patient arrived on time for her Caregility session  Time Spent: 58 minutes, 2 PM to 2:58 PM  Treatment Type: Individual Therapy Reported Symptoms: anxiety, panic attacks A fairly difficult week because it was his daughter's birthday.  He said this year was more difficult than last year but  he could not pinpoint why.  They did remember her about going out to a nice restaurant which is something her daughter always wanted to do.  He said they were able to laugh and joke  about funny things that his daughter had said or done which is not something they were able to do this time last year so he saw that as healthy grieving.  He knows that the time change threw things off a little bit for everybody and his wife still is not fee ling well and his back has been bothering him.  He was not able to go to his first round of physical therapy this week because his back was bothering him more.  He has been thinking a lot more about his daughter which he feels comfortable with.  He is thankful that it is getting warmer and staying light longer so he can get back to some of the coping skills such as fishing that he has done in the past.   We will continue to work on c oping skills for helping with his anxiety and the progression of his Lewy body dementia diagnosis as well as processing his grief. 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 Speech/Language:  Normal Rate Affect: Appropriate Mood: normal Thought process: normal Thought content:  WNL Sensory/Perceptual disturbances:  WNL Orientation: or iented to person, place, and situation Attention: Good Concentration: Good Memory: Impaired short term Fund of knowledge:  Good Insight:  Good Judgment:  Good Impulse Control: Good  Risk Assessment: Danger to Self: No Self-injurious Behavior: No Danger to Others: No Duty to Warn:no Physical Aggression / Violence:No  Access to Firearms a concern: No  Gang Involvement:No  Treatment plan: Will employ cognitive behavioral  therapy as well as grief and loss therapy for relief of anxiety and grief.  Goals of grief therapy or to have a healthier response to the loss of his daughter and less sadness as evidenced by patient report in therapy notes as well as to help him feel less overwhelmed by her loss.  Goals are to see a 50% reduction in grief symptoms over the next 6 months.  I will  encourage the use of the patient telling his grief story about his daught er including encouraging him to bring pictures and memorabilia to help process his feelings including any feelings of guilt associated with her loss.  We will use cognitive behavioral therapy to help identify and change anxiety producing thoughts and behavior patterns so as to improve his ability to better manage anxiety and stress, and manage thoughts and worrisome thinking contributing to anxiety.  We will also refresh and encourage d ialectical behavior therapy  distress tolerance and mindfulness skills with the intention of reducing anxiety by 50% over the next 6 months. Progress: 30% Interventions: Cognitive Behavioral Therapy  Diagnosis:PTSD, Bi-Polar D/O  Plan: F/U/ appointments scheduled weekly.  Cecile Coder, [facility]                                                                                                                    Cecile Coder, [facility]               Cecile Coder, [facility]               Cecile Coder, [facility]  [place] [facility] Counselor/Therapist Progress Note  Patient ID: Monterio Bob, MRN: [id number],    Date: 04/01/2024 This session was held via video teletherapy. The patient consented to the video teletherapy and was located in his home during this session. He is aware it is the responsibility of the patient to secure confidentiali ty on his end of the session. The provider was in a private home office for the duration of this session.   The patient arrived on time for her Caregility session  Time Spent: 57 minutes, 2 PM to 2:57 PM  Treatment Type: Individual Therapy Reported Symptoms: anxiety, panic attacks The patient is still having significant back pain.  He cannot sit anywhere for very long and has to be careful where he sits.  There is still some uns teadiness although he did not report any falls over the past 2 weeks.  His biggest concern now is for his wife.  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  She is meeting with her cardiologist again next week to see what possible treatment is for her.  He still thinks there may be some issue with her diaphragm but she is meeting with the pulmonologist also .  He is doing what he can to help her.  His daughter's birthday is March 11 so he knows that is a difficult time for both he and his wife next week.  Typically they do something to  remember her but because of the wife's current health issues they cannot go to [place] as they had planned so they are talking about what to do to remember and celebrate her birthday.  We will continue to work on coping skills for helping with his  anxiety and the progression of his Lewy body dementia diagnosis as well as processing his grief. 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 Speech/Language:  Normal Rate Affect: Appropriate Mood: normal Thought process: normal Thought content:  WNL Sensory/Perceptual disturbances:  WNL Orientation: oriented to person, place, and situa tion Attention: Good Concentration: Good Memory: Impaired short term Fund of knowledge:  Good Insight:  Good Judgment:  Good Impulse Control: Good  Risk Assessment: Danger to Self: No Self-injurious Behavior: No Danger to Others: No Duty to Warn:no Physical Aggression / Violence:No  Access to Firearms a concern: No  Gang Involvement:No  Treatment plan: Will employ cognitive behavioral therapy as well as grief and loss  therapy for relief of anxiety and grief.  Goals of grief therapy or to have a healthier response to the loss of his daughter and less sadness as evidenced by patient report in therapy notes as well as to help him feel less overwhelmed by her loss.  Goals are to see a 50% reduction in grief symptoms over the next 6 months.  I will encourage the use of the patient telling his grief story about his daughter including encouraging him to br ing pictures and memorabilia to help process his feelings including any feelings of guilt associated with her loss.  We will use cognitive behavioral therapy to help identify and change anxiety producing thoughts and behavior patterns so as to improve his ability to better manage anxiety and stress, and manage thoughts and worrisome thinking contributing to anxiety.   We will also refresh and encourage dialectical behavior therapy distre ss tolerance and mindfulness  skills with the intention of reducing anxiety by 50% over the next 6 months. Progress: 30% Interventions: Cognitive Behavioral Therapy  Diagnosis:PTSD, Bi-Polar D/O  Plan: F/U/ appointments scheduled weekly.  Cecile Coder, [facility]                                                                                                                    Cecile Coder, [facility]  [place] [facility] Counselo r/Therapist Progress Note  Patient ID: Perl Kerney, MRN: [id number],    Date: 04/01/2024 This session was  held via video teletherapy. The patient consented to the video teletherapy and was located in his home during this session. He is aware it is the responsibility of the patient to secure confidentiality on his end of the session. The provider was in a private home office for the duration of this session.   The patient arr ived on time for her Caregility session  Time Spent: 57 minutes, 2 PM to 2:57 PM  Treatment Type: Individual Therapy Reported Symptoms: anxiety, panic attacks  We will continue to work on coping skills for helping with his anxiety and the progression of his Lewy body dementia diagnosis as well as processing his grief. Mental Status Exam: Appearance:  Well Groomed  Behavior: Appropriate Motor: Pt. Reports some instability due  to Lewey Body dementia Speech/Language:  Normal Rate Affect: Appropriate Mood: normal Thought process: normal Thought content:  WNL Sensory/Perceptual disturbances:  WNL Orientation: oriented to person, place, and situation Attention: Good Concentration: Good Memory: Impaired short term Fund of knowledge:  Good Insight:  Good Judgment:  Good Impulse Control: Good  Risk Assessment: Danger to Self: No Self-injurious  Behavior: No Danger to Others: No Duty to Warn:no Physical Aggression / Violence:No  Access to Firearms a concern: No  Gang Involvement:No  Treatment plan: Will employ cognitive behavioral therapy as well as grief and loss therapy for relief of anxiety and grief.  Goals of grief therapy or to have a healthier response to the loss of his daughter and less sadness as evidenced by patient report in therapy notes as well as to help h im feel less overwhelmed by her loss.  Goals are to see a 50% reduction in grief symptoms over the next 6 months.  I will encourage the use of the patient telling his grief story about his daughter including encouraging him to bring pictures and memorabilia to help process his feelings including any  feelings of guilt associated with her loss.  We will use cognitive behavioral therapy to help identify and change anxiety producing thought s and behavior patterns so as to improve his ability to better manage anxiety and stress, and manage thoughts and worrisome thinking contributing to anxiety.  We will also refresh and encourage dialectical behavior therapy distress tolerance and mindfulness skills with the intention of reducing anxiety by 50% over the next 6 months. Progress: 30% Interventions: Cognitive Behavioral Therapy  Diagnosis:PTSD, Bi-Polar D/O  Plan: F/U/  appointments scheduled weekly.  Cecile Coder, [facility]                                                                                                                   Cecile Coder, [facility]               Cecile Coder, [facility]               Cecile Coder, [facility]  C raig Kelly Patient, [facility]               Cecile Coder, [facility]               Cecile Coder, [facility]               Cecile Coder, [facility]  [place] [facility] Counselor/Therapist Progress Note  Patient ID: Tiandre Teall, MRN: [id number],    Date: 04/01/2024 This session was held via video teletherapy. The patient consented to the video teletherapy and was located in his home during this session. He is aware it is the responsibility of the patient to secure confidentiality on his end of the session. The provide r was in a private home office for the duration of this session.   The patient arrived on time for her Caregility session  Time Spent: 39 minutes, 2 PM to 2:39 PM  Treatment Type: Individual Therapy Reported Symptoms: anxiety, panic attacks The patient was not feeling well today.  He started feeling really earlier in the week having a fever bad headaches body aches.  He felt a little better yesterday but feels a little bit of a  resurgence in headaches and body aches and is running a low-grade fever.  His wife is feeling some of the same symptoms.  For the most part he says he is doing well.  They did increase his memory medication because they were starting to see some increasing decline in short-term memory and he said until recently his long-term memory had been good but he was starting to see a difference in how he used to markers to remember what happened  at certain times historically.  He feels the medication has helped and has brightened his mood a little also.  He is still trying to walk every day and fish when he can and has enjoyed  watching the [employer] as a good distraction.  He reports that he feels he is managing mood fairly well.  He has an appointment in 2 weeks.  He does contract for safety having no thoughts of hurting himself or anyone else. We will continu e to work on Pharmacologist for helping with his anxiety and the progression of his Lewy body dementia diagnosis as well as processing his grief. 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 Speech/Language:  Normal Rate Affect: Appropriate Mood: normal Thought process: normal Thought content:  WNL Sensory/Perceptual disturbances:  WNL O rientation: oriented to person, place, and situation Attention: Good Concentration: Good Memory: Impaired short term Fund of knowledge:  Good Insight:  Good Judgment:  Good Impulse Control: Good  Risk Assessment: Danger to Self: No Self-injurious Behavior: No Danger to Others: No Duty to Warn:no Physical Aggression / Violence:No  Access to Firearms a concern: No  Gang Involvement:No  Treatment plan: Will employ cognit ive behavioral therapy as well as grief and loss therapy for relief of anxiety and grief.  Goals of grief therapy or to have a healthier response to the loss of his daughter and less sadness as evidenced by patient report in therapy notes as well as to help him feel less overwhelmed by her loss.  Goals are to see a 50% reduction in grief symptoms over the next 6 months.  I will encourage the use of the patient telling his grief story ab out his daughter including encouraging him to bring pictures and memorabilia to help process his feelings including any feelings of guilt associated with her loss.  We will use cognitive behavioral therapy to help identify and change anxiety producing thoughts and behavior patterns so as to improve his ability to better manage anxiety and stress, and manage thoughts  and worrisome thinking contributing to anxiety.  We will also refresh a  nd encourage dialectical behavior therapy distress tolerance and mindfulness skills with the intention of reducing anxiety by 50% over the next 6 months. Progress: 30% Interventions: Cognitive Behavioral Therapy  Diagnosis:PTSD, Bi-Polar D/O  Plan: F/U/ appointments scheduled weekly.  Cecile Coder, [facility]                                                                                                                    Cecile Coder, [facility]  [place] [facility] Counselor/Therapist Progress Note  Patient ID: Oriel Rumbold, MRN:  [id number],    Date: 04/01/2024 This session was held via video teletherapy. The patient consented to the video teletherapy and was located in his home during this session. He is aware it is the responsibility of the patient to secure confidentiality on his end of the session. The provider was in a private home office for th e duration of this session.   The patient arrived on time for her Caregility session  Time Spent: 58 minutes, 2 PM to 2:58 PM  Treatment Type: Individual Therapy Reported Symptoms: anxiety, panic attacks A fairly difficult week because it was his daughter's birthday.  He said this year was more difficult than last year but he could not pinpoint why.  They did remember her about going out to a nice restaurant which is something h er daughter always wanted to do.  He said they were able to laugh and joke about funny things that his daughter had said or done which is not something they were able to do this time last year so he saw that as healthy grieving.  He knows that the time change threw things off a little bit for everybody and his wife still is not feeling well and his back has been bothering him.  He was not able to go to his first round of physical therap y this week because his back was bothering him more.  He has been thinking a lot more about his daughter which he feels comfortable with.  He is thankful that it is getting warmer and staying light longer so he can get back to some of the coping skills such as fishing that he has done in the past.   We will continue to work on coping skills for helping with his anxiety and the progression of his Lewy body dementia diagnosis as well a s processing his grief. 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 Speech/Language:  Normal Rate Affect: Appropriate Mood: normal Thought process: normal Thought content:  WNL Sensory/Perceptual  disturbances:  WNL Orientation: oriented to person, place, and situation Attention: Good Concentration: Good Memory: Impaired short term  Fund of knowledge:  Good Insight:  Good Judgment:  Good Impulse Control: Good  Risk Assessment: Danger to Self: No Self-injurious Behavior: No Danger to Others: No Duty to Warn:no Physical Aggression / Violence:No  Access to Firearms a concern: No  Gang Involvement:No  Treatment plan: Will employ cognitive behavioral therapy as well as grief and loss therapy for relief of anxiety and grief.  Goals of grief therapy or to h ave a healthier response to the loss of his daughter and less sadness as evidenced by patient report in therapy notes as well as to help him feel less overwhelmed by her loss.  Goals are to see a 50% reduction in grief symptoms over the next 6 months.  I will encourage the use of the patient telling his grief story about his daughter including encouraging him to bring pictures and memorabilia to help process his feelings including any f eelings of guilt associated with her loss.  We will use cognitive behavioral therapy to help identify and change anxiety producing thoughts and behavior patterns so as to improve his ability to better manage anxiety and stress, and manage thoughts and worrisome thinking contributing to anxiety.  We will also refresh and encourage dialectical behavior therapy distress  tolerance and mindfulness skills with the intention of reducing anxiet y by 50% over the next 6 months. Progress: 30% Interventions: Cognitive Behavioral Therapy  Diagnosis:PTSD, Bi-Polar D/O  Plan: F/U/ appointments scheduled weekly.  Cecile Coder, [facility]                                                                                                                   Cecile Coder,  [facility]               Cecile Coder, [facility]               Cecile Coder, [facility]  [facility] [facility] ounselor/Therapist Progress Note  Patient ID: Qamar Aughenbaugh, MRN: [id number],    Date: 04/01/2024 This session was held via video teletherapy. The patient consented to the video teletherapy and was located in his home during this session. He is aware it is the responsibility of the patient to secure confidentiality on his end of the session. The provider was in a private home office for the duration of this session.   The pati ent arrived on time for her Caregility session  Time Spent: 1:05 PM until 2 PM, 55 minutes Treatment Type: Individual Therapy Reported Symptoms: anxiety, panic attacks The patient said he came in from his walk this  morning and saw what he thought was some sort of tic from his wife.  It was not the first time it happened and he wonders if it might be tardive dyskinesia because of some new medications that she is on.  She has a call  into the doctor right now and hopes to hear something back soon.  They have narrowed down to the fact they think most of her discomfort in breathing is coming from the diaphragm and they are looking for alternatives for treatment.  The patient continues to do those things which help him cope including walking, fishing when he can, spending time with his son and daughter-in-law and grandson.  He also has a way of continued coping with  grief he is writing letters to his daughters as a way of expressing his feelings and says that is very beneficial.  He appears to be grieving in a healthy way but he knows that he to year anniversary of her death is coming up and there is some grief there but says it is not overwhelming.  We will continue to work on coping skills for helping with his anxiety and the progression of his Lewy body dementia diagnosis as well as processing  his grief. 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 Speech/Language:  Normal Rate Affect: Appropriate Mood: normal Thought process: normal Thought content:  WNL Sensory/Perceptual disturbances:  WNL Orientation: oriented to person, place, and situation Attention: Good Concentration: Good Memory: Impaired short term Fund of know ledge:  Good Insight:  Good Judgment:  Good Impulse Control: Good  Risk Assessment: Danger to Self: No Self-injurious Behavior: No Danger to Others: No Duty to Warn:no Physical Aggression / Violence:No  Access to Firearms a concern: No  Gang Involvement:No  Treatment plan: Will employ cognitive behavioral therapy as well as grief and loss therapy for relief of anxiety and grief.  Goals  of grief therapy or to have a health ier response to the loss of his daughter and less sadness as evidenced by patient report in therapy notes as well as to help him feel less overwhelmed by her loss.  Goals are to see a 50% reduction in grief symptoms over the next 6 months.  I will encourage the use of the patient telling his grief story about his daughter including encouraging him to bring pictures and memorabilia to help process his feelings including any feelings of g uilt associated with her loss.  We will use cognitive behavioral therapy to help identify and change anxiety producing thoughts and behavior patterns so as to improve his ability to better manage anxiety and stress, and manage thoughts and worrisome thinking contributing to anxiety.  We will also refresh and encourage dialectical behavior therapy distress tolerance  and mindfulness skills with the intention of reducing anxiety by 50% ove r the next 6 months. Progress: 30% Interventions: Cognitive Behavioral Therapy  Diagnosis:PTSD, Bi-Polar D/O  Plan: F/U/ appointments scheduled weekly.  Cecile Coder, [facility]                                                                                                                   Cecile Coder, [facility]  [place] [facility] Counselor/Therapist Progress Note  Patient ID: Selah Zelman, MRN: [id number],    Date : 04/01/2024 This session was held via video teletherapy. The patient consented to the video teletherapy and was located in his home during this session. He is aware it is the responsibility of the patient to secure confidentiality on his end of the session. The provider was in a private home office for the duration of this session.   The patient arrived on time for her Caregility session  Time Spent: 59 minutes, 2 PM to 2:59 PM   Treatment Type: Individual Therapy Reported Symptoms: anxiety, panic attacks The patient continues to present with physical pain and both his back and his knee.  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  He has continued to walk because he know s that is good for him in various ways but says it has been painful.  His wife also was not feeling well but she is going back to the doctor next week to have an ablation and hopes that  will bring her some relief.  For the most part he has been at the last couple weeks reaching back out and hearing from some old friends as well as time with his son and son's family.  He says that his wife's cousin is coming down for a weekend soon and t hey enjoy time with her and that he will go to his brother's house sometime in the next few weeks.  He recognizes the need for people that he cares about in helping with his depression. We will continue to work on coping skills for helping with his anxiety and the progression of his Lewy body dementia diagnosis as well as processing his grief. Mental Status Exam: Appearance:  Well Groomed  Behavior: Appropriate Motor: Pt. Reports  some instability due to Lewey Body dementia Speech/Language:  Normal Rate Affect: Appropriate Mood: normal Thought process: normal Thought content:  WNL Sensory/Perceptual disturbances:  WNL Orientation: oriented to person, place, and situation Attention: Good Concentration: Good Memory: Impaired short term Fund of knowledge:  Good Insight:  Good Judgment:  Good Impulse Control: Good  Risk Assessment: Danger to Self : No Self-injurious Behavior: No Danger to Others: No Duty to Warn:no Physical Aggression / Violence:No  Access to Firearms a concern: No  Gang Involvement:No  Treatment plan: Will employ cognitive behavioral therapy as well as grief and loss therapy for relief of anxiety and grief.  Goals of grief therapy or to have a healthier response to the loss of his daughter and less sadness as evidenced by patient report in therapy notes  as well as to help him feel less overwhelmed by her loss.  Goals are to see a 50% reduction in grief symptoms over the next 6 months.  I will encourage the use of the patient telling his grief story about his daughter including encouraging him to bring pictures and memorabilia to help process his feelings including any feelings of guilt associated with her loss.  We  will use cognitive behavioral therapy to help identify and change anxi ety producing thoughts and behavior patterns so as to improve his ability to better manage anxiety and stress, and manage thoughts and worrisome thinking contributing to anxiety.  We will  also refresh and encourage dialectical behavior therapy distress tolerance and mindfulness skills with the intention of reducing anxiety by 50% over the next 6 months. Progress: 30% Interventions: Cognitive Behavioral Therapy  Diagnosis:PTSD, Bi-Po lar D/O  Plan: F/U/ appointments scheduled weekly.  Cecile Coder, [facility]                                                                                                                   Cecile Coder, [facility]               Cecile Coder, [facility]               Cecile Coder, [facility]                Cecile Coder, [facility]  [place] [facility] Counselor/Therapist Progress Note  Patient ID: Garv Kuechle, MRN: [id number],    Date: 04/01/2024 This session was held via video teletherapy. The patient consented to the video teletherapy and was located in his home during this session. He is aware it is the responsibility of the patient to secure confidentiality on his end of the session. The provider was in a private home office for the duration of this session.   The patient arrived on time for her Caregility session  Time  Spent: 57 minutes, 2 PM to 2:57 PM  Treatment Type: Individual Therapy Reported Symptoms: anxiety, panic attacks The patient is still having significant back pain.  He cannot sit anywhere for very long and has to be careful where he sits.  There is still some unsteadiness although he did not report any falls over the past 2 weeks.  His biggest concern now is for his wife.  She has been having some difficulty breathing and they did a  test so they found a small hole on the back side of her heart.  She is meeting with her cardiologist again next week to see what possible treatment is for her.  He still thinks there may be some issue with her diaphragm but she is meeting with the pulmonologist also.  He is doing what he can to help her.  His daughter's birthday is March 11 so he knows that is a difficult time for both he and his wife next week.  Typically they do so mething to remember her but because of the wife's current health issues they cannot go to [place] as they had planned so they are talking about what to do to remember and  celebrate her birthday.  We will continue to work on coping skills for helping with his anxiety and the progression of his Lewy body dementia diagnosis as well as processing his grief. Mental Status Exam: Appearance:  Well Groomed  Behavior: Appropriate M otor: Pt. Reports some instability due to Lewey Body dementia Speech/Language:  Normal Rate Affect: Appropriate Mood: normal Thought process: normal Thought content:  WNL Sensory/Perceptual disturbances:  WNL Orientation: oriented to person, place, and situation Attention: Good Concentration: Good Memory: Impaired short term Fund of knowledge:  Good Insight:  Good Judgment:  Good Impulse Control: Good  Risk Assessment : Danger to Self: No Self-injurious Behavior: No Danger to Others: No Duty to Warn:no Physical Aggression / Violence:No  Access to Firearms a concern: No  Gang Involvement:No  Treatment plan: Will employ cognitive behavioral therapy as well as grief and loss therapy for relief of anxiety and grief.  Goals of grief therapy or to have a healthier response to the loss of his daughter and less sadness as evidenced by patient report  in therapy notes as well as to help him feel less overwhelmed by her loss.  Goals are to see a 50% reduction in grief symptoms over the next 6 months.  I will encourage the use of the patient telling his grief story about his daughter including encouraging him to bring pictures and memorabilia to help process his feelings including any feelings of guilt associated with her loss.  We will use cognitive behavioral therapy to help identif y and change anxiety producing thoughts and behavior patterns so as to improve his ability to better manage anxiety and stress, and manage thoughts and worrisome thinking contributing to anxiety.  We will also refresh and encourage dialectical behavior therapy distress tolerance and mindfulness skills  with the intention of reducing anxiety by 50% over the next  6 months. Progress: 30% Interventions: Cognitive Behavioral Therapy  Diag nosis:PTSD, Bi-Polar D/O  Plan: F/U/ appointments scheduled weekly.  Cecile Coder, [facility]                                                                                                                   Cecile Coder, [facility]  [place] [facility] Counselor/Therapist Progress Note  Patient ID: Othal Kubitz, MRN: [id number],    Date: 04/01/2024 This session was held via video teletherapy. The patient consented to the v ideo teletherapy and was located in his home during this session. He is aware it is the responsibility of  the patient to secure confidentiality on his end of the session. The provider was in a private home office for the duration of this session.   The patient arrived on time for her Caregility session  Time Spent: 58 minutes, 2 PM to 2:58 PM  Treatment Type: Individual Therapy Reported Symptoms: anxiety, panic attacks A fairly  difficult week because it was his daughter's birthday.  He said this year was more difficult than last year but he could not pinpoint why.  They did remember her about going out to a nice restaurant which is something her daughter always wanted to do.  He said they were able to laugh and joke about funny things that his daughter had said or done which is not something they were able to do this time last year so he saw that as healthy gr ieving.  He knows that the time change threw things off a little bit for everybody and his wife still is not feeling well and his back has been bothering him.  He was not able to go to his first round of physical therapy this week because his back was bothering him more.  He has been thinking a lot more about his daughter which he feels comfortable with.  He is thankful that it is getting warmer and staying light longer so he can get ba ck to some of the coping skills such as fishing that he has done in the past.   We will continue to work on coping skills for helping with his anxiety and the progression of his Lewy body dementia diagnosis as well as processing his grief. 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 Speech/Language:  Normal Rate Affect: Appropriate Mood:  normal Thought process: normal Thought content:  WNL Sensory/Perceptual disturbances:  WNL Orientation: oriented to person, place, and situation Attention: Good Concentration: Good Memory: Impaired short term Fund of knowledge:  Good Insight:  Good Judgment:  Good Impulse  Control: Good  Risk Assessment: Danger to Self: No Self-injurious Behavior: No Danger to Others: No Duty to Warn:no Physical Aggression / Violenc e:No  Access to Firearms a concern: No  Gang Involvement:No  Treatment plan: Will employ cognitive behavioral therapy as well as grief and loss therapy for relief of anxiety and grief.  Goals of grief therapy or to have a healthier response to the loss of his daughter and less sadness as evidenced by patient report in therapy notes as well as to help him feel less overwhelmed by her loss.  Goals are to see a 50% reduction in grief sy mptoms over the next 6 months.  I will encourage the use of the patient telling his grief story about his daughter including encouraging him to bring pictures and memorabilia to help process his feelings including any feelings of guilt associated with her loss.  We will use cognitive behavioral therapy to help identify and change anxiety producing thoughts and behavior patterns so as to improve his ability to better manage anxiety and s tress, and manage thoughts and worrisome thinking contributing to anxiety.  We will also refresh and encourage dialectical behavior therapy  distress tolerance and mindfulness skills with the intention of reducing anxiety by 50% over the next 6 months. Progress: 30% Interventions: Cognitive Behavioral Therapy  Diagnosis:PTSD, Bi-Polar D/O  Plan: F/U/ appointments scheduled weekly.  Cecile Coder, [facility]                                                                                                                    Cecile Coder, [facility]               Cecile Coder, [facility]               Cecile Coder, [facility]  [place] [facility] Counselor/Therapist Progress Note  Patient ID: Schuyler Olden, MRN: [id number],    Date: 04/01/2024 This session was held via video teletherapy. The patient consented to the video teletherapy and was loca ted in his home during this session. He is aware it is the responsibility of the patient to secure confidentiality on his end of the session. The provider was in a private home office for the duration of this session.   The patient arrived on time for her Caregility session  Time Spent: 57 minutes, 2 PM to 2:57 PM  Treatment Type: Individual Therapy Reported Symptoms: anxiety, panic attacks The patient is still having significan t back pain.  He cannot sit anywhere for very long and has to be careful where he sits.  There is still some unsteadiness although he did not report any falls over the past 2 weeks.   His biggest concern now is for his wife.  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  She is meeting with her cardiologist again next week to see what possible treatment is for  her.  He still thinks there may be some issue with her diaphragm but she is meeting with the pulmonologist also.  He is doing what he can to help her.  His daughter's birthday is March 11 so he knows that is a difficult time for both he and his wife next week.  Typically they do something to remember her but because of the wife's current health issues they cannot go to [place] as they had planned so they are talking about what  to do to remember and celebrate her birthday.  We will continue to work on coping skills for helping with his anxiety and the progression of his Lewy body dementia diagnosis as well as processing his grief. 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 Speech/Language:  Normal Rate Affect: Appropriate Mood: normal Thought process: normal T hought content:  WNL Sensory/Perceptual disturbances:  WNL Orientation: oriented to person, place, and situation Attention: Good Concentration: Good Memory: Impaired short term Fund of knowledge:  Good Insight:  Good Judgment:  Good Impulse Control: Good  Risk Assessment: Danger to Self: No Self-injurious Behavior: No Danger to Others: No Duty to Warn:no Physical Aggression / Violence:No  Access to Firearms a concer n: No  Gang Involvement:No  Treatment plan: Will employ cognitive behavioral therapy as well as grief and loss therapy for relief of anxiety and grief.  Goals of grief therapy or to have a healthier response to the loss of his daughter and less sadness as evidenced by patient report in therapy notes as well as to help him feel less overwhelmed by her loss.  Goals  are to see a 50% reduction in grief symptoms over the next 6 months.  I  will encourage the use of the patient telling his grief story about his daughter including encouraging him to bring pictures and memorabilia to help process his feelings including any feelings of guilt associated with her loss.  We will use cognitive behavioral therapy to help identify and change anxiety producing thoughts and behavior patterns so as to improve his ability to better manage anxiety and stress, and manage thoughts and wor risome thinking contributing to anxiety.  We will also refresh and encourage dialectical behavior therapy distress tolerance and mindfulness  skills with the intention of reducing anxiety by 50% over the next 6 months. Progress: 30% Interventions: Cognitive Behavioral Therapy  Diagnosis:PTSD, Bi-Polar D/O  Plan: F/U/ appointments scheduled weekly.  Cecile Coder, [facility]                                                                                                                    Cecile Coder, [facility]   Behavioral Health Counselor/Therapist Progress Note  Patient ID: Curby Carswell, MRN: [id number],    Date: 04/01/2024 This session was held via video teletherapy. The patient consented to the video teletherapy and was located in his home during this session. He is aware it is the responsibility of the patient to secure confidentiality on his end  of the session. The provider was in a private home office for the duration of this session.   The patient arrived on time for her Caregility session  Time Spent: 57 minutes, 2 PM to 2:57 PM  Treatment Type: Individual Therapy Reported Symptoms: anxiety, panic attacks That was a situation earlier in the week when the patient's neighbor was banging on his back door and window.  There had been situations where the same neighbor had  said things and use profanity directed at his wife feeling that the patient and his wife and let their dog use the bathroom in his yard.  The patient said they are very careful because her dog is old and did not think that happen but did not appreciate how they were handled things.  He said he got angry very quickly and did not handle it the way that he wanted to.  The situation has been resolved but the patient has had very clear boun daries with his neighbor. His wife continues to progress well from her surgery.  Physically the patient is doing fairly well.  He had to see one of his brothers this week.  He is  spending time with his son and son's family.  He has gotten some retirement/insurance things works out for the most part and is glad to have that settled. We will continue to work on coping skills for helping with his anxiety and the progression of his Lewy b ody dementia diagnosis as well as processing his grief. 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 Speech/Language:  Normal Rate Affect: Appropriate Mood: normal Thought process: normal Thought content:  WNL Sensory/Perceptual disturbances:  WNL Orientation: oriented to person, place, and situation Attention: Good Concentration: Goo d Memory: Impaired short term Fund of knowledge:  Good Insight:  Good Judgment:  Good Impulse Control: Good  Risk Assessment: Danger to Self: No Self-injurious Behavior: No Danger to Others: No Duty to Warn:no Physical Aggression / Violence:No  Access to Firearms a concern: No  Gang Involvement:No  Treatment plan: Will employ cognitive behavioral therapy as well as grief and loss therapy for relief of anxiety and grief.   Goals of grief therapy or to have a healthier response to the loss of his daughter and less sadness as evidenced by patient report in therapy notes as well as to help him feel less overwhelmed by her loss.  Goals are to see a 50% reduction in grief symptoms over the next 6 months.  I will encourage the use of the patient telling his grief story about his daughter including encouraging him to bring pictures and memorabilia to help proc ess his feelings including any feelings of guilt associated with her loss.  We will use cognitive behavioral therapy to help identify and change anxiety producing thoughts and behavior patterns so as to improve his ability to better manage anxiety and stress, and manage thoughts and worrisome thinking contributing to anxiety.  We will also refresh and encourage  dialectical behavior therapy distress tolerance and mindfulness skills with  the intention of reducing anxiety by 50% over  the next 6 months. Progress: 30% Interventions: Cognitive Behavioral Therapy  Diagnosis:PTSD, Bi-Polar D/O  Plan: F/U/ appointments scheduled weekly.  Cecile Coder, [facility]                                                                                                                    Cecile Coder, [facility]               Cecile Coder, [facility]               Cecile Coder, [facility]               Cecile Coder, [facility]               Cecile Coder, [facility]               Cecile Coder, [facility]               Cecile Coder, [facility]               Cecile Coder, [facility]               Cecile Coder,  [facility]               Cecile Coder, [facility]  [facility] Counselor/Therapist Progress  Note  Patient ID: Kouper Spinella, MRN: [id number],    Date: 04/01/2024 This session was held via video teletherapy. The patient consented to the video teletherapy and was located in his home during this session. He is aware it is the responsibility of the patient to secure confidentiality on his end of the session. The provider was in a private home office for the duration of this session.   The patient arrived on time for her  Caregility session  Time Spent: 59 minutes, 2 PM to 2:59 PM  Treatment Type: Individual Therapy Reported Symptoms: anxiety, panic attacks The patient said he jokingly said to his wife that he had not had any pain issues in the cervical area and then a few days ago he started having pain in his neck and shoulders.  He now sits related to degenerative disk and has some consistent pain somewhere in his spine with that but Shawn Delay ys it just depends on Wednesday which part of his body is decides to show up and hurt.  He is doing the best that he can.  He is still walking which he knows is good for him but does not have right now the strength to do as much in the morning as he has done in the past so he is denying that to 3 times a day for consistency.  He is still doing some positive things and that he is staying in touch with his brother and other family members .  He is reconnecting with friends from high school and he is which she is enjoying.  One of them has had a similar experience in that she lost her son in the same way the patient lost  his daughter.  He said they did not think and Easter in which they set a place for his daughter at the table and put her picture there.  He initially had reservations about doing that he and his wife had made everyone feel as if she was really there with  him.  He still feels that he is grieving in a healthy way.  He and his wife have always taken a trip around both of their birthdays which are 2 days apart.  That is now close to his daughter died so they have started taking some of her rashes with him and sprinkling them where they go as a way of remembrance.  They are deciding what that looks like this year in July.  He does contract for safety having no thoughts of hurting himself o r anyone else. We will continue to work on coping skills for helping with his anxiety and the progression of his Lewy body dementia diagnosis as well as processing his grief. 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 Speech/Language:  Normal Rate Affect: Appropriate Mood: normal Thought process: normal Thought content:  WNL Sensory/Pe rceptual disturbances:  WNL Orientation: oriented to person, place, and situation Attention: Good Concentration: Good Memory: Impaired short term Fund of knowledge:  Good Insight:  Good Judgment:  Good Impulse Control: Good  Risk Assessment: Danger to Self: No Self-injurious Behavior: No Danger to Others: No Duty to Warn:no Physical Aggression / Violence:No  Access to Firearms a concern: No  Gang Involvement:No  Tre atment plan: Will employ cognitive behavioral therapy as well as grief and loss therapy for relief of anxiety and grief.  Goals of grief therapy  or to have a healthier response to the loss of his daughter and less sadness as evidenced by patient report in therapy notes as well as to help him feel less overwhelmed by her loss.  Goals are to see a 50% reduction in grief  symptoms over the next 6 months.  I will encourage the use of the pat ient telling his grief story about his daughter including encouraging him to bring pictures and memorabilia to help process his feelings including any feelings of guilt associated with her loss.  We will use cognitive behavioral therapy to help identify and change anxiety producing thoughts and behavior patterns so as to improve his ability to better manage anxiety and stress, and manage thoughts and worrisome thinking contributing to a nxiety.  We will also refresh and encourage dialectical behavior therapy distress tolerance and mindfulness skills with the intention of reducing anxiety by 50% over the next 6 months. Progress: 30% Interventions: Cognitive Behavioral Therapy  Diagnosis:PTSD, Bi-Polar D/O  Plan: F/U/ appointments scheduled weekly.  Cecile Coder, [facility]                                                                                                                    Cecile Coder, [facility]  [facility] Counselor/Therapist Progress Note  Patient ID: Garry Nicolini, MRN: [id number],    Date: 04/01/2024 This session was held via video teletherapy. The patient consented to the video teletherapy and was located in his home during this session. He is aware it is the responsibility of the patient to secure confidentiality on his end of the session. The provider was  in a private home office for the duration of this session.   The patient arrived on time for her Caregility session  Time Spent: 58 minutes, 2 PM to 2:58 PM  Treatment Type: Individual Therapy Reported Symptoms: anxiety, panic attacks A fairly difficult week because it was his daughter's birthday.  He said this year was more difficult than last year but he could not pinpoint why.  They did remember her about going out to a nice  restaurant which is something her daughter always wanted to do.  He said they were able to laugh and joke about funny things that his daughter had said or done which is not something they were able to do this time last year so he saw that as healthy grieving.  He knows that the time change threw things off a little bit for everybody and his wife still is not feeling well and his back has been bothering him.  He was not able to go to his  first round of physical therapy this week because his back was bothering him more.  He has been thinking a lot more about his daughter which he feels comfortable with.  He is thankful  that it is getting warmer and staying light longer so he can get back to some of the coping skills such as fishing that he has done in the past.   We will continue to work on coping skills for helping with his anxiety and the progression of his Lewy bo dy dementia diagnosis as well as processing his grief. 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 Speech/Language:  Normal Rate Affect: Appropriate Mood: normal Thought process: normal Thought content:  WNL Sensory/Perceptual disturbances:  WNL Orientation: oriented to person, place, and situation Attention: Good Concentration: Good  Memory: Impaired short term Fund of knowledge:  Good Insight:  Good Judgment:  Good Impulse Control: Good  Risk Assessment: Danger to Self: No Self-injurious Behavior: No Danger to Others: No Duty to Warn:no Physical Aggression / Violence:No  Access to Firearms a concern: No  Gang Involvement:No  Treatment plan: Will employ cognitive behavioral therapy as well as grief and loss therapy for relief of anxiety and grief.   Goals of grief therapy or to have a healthier response to the loss of his daughter and less sadness as evidenced by patient report in therapy notes as well as to help him feel less overwhelmed by her loss.  Goals are to see a 50% reduction in grief symptoms over the next 6 months.  I will encourage the use of the patient telling his grief story about his daughter including encouraging him to bring pictures and memorabilia to help proce ss his feelings including any feelings of guilt associated with her loss.  We will use cognitive behavioral therapy to help identify and change anxiety producing thoughts and behavior patterns so as to improve his ability to better manage anxiety and stress, and manage thoughts and worrisome thinking contributing to anxiety.  We will also refresh and encourage  dialectical behavior therapy distress  tolerance and mindfulness skills with t he intention of reducing anxiety by 50% over the next 6 months. Progress: 30% Interventions: Cognitive Behavioral Therapy  Diagnosis:PTSD, Bi-Polar D/O  Plan: F/U/ appointments scheduled weekly.  Cecile Coder, [facility]                                                                                                                    Cecile Coder, [facility]               Cecile Coder, [facility]               Cecile Coder, [facility]  [facility] Counselor/Therapist Progress Note  Patient ID: Khalel Alms, MRN:  [id number],    Date: 04/01/2024 This session was held via video teletherapy. The patient consented to the video teletherapy and was located in his home during this session. He is aware it is the responsibility of the patient to secure confidentiality on his end of the session. The provider was in a private home office for the duration  of this session.   The patient arrived on time for her Caregility session  Time Spent: 1:05 PM until 2 PM, 55 minutes Treatment Type: Individual Therapy Reported Symptoms: anxiety, panic attacks The patient said he came in from his walk this morning and saw what he thought was some sort of tic from his wife.  It was not the first time it happened and he wonders if it might be tardive dyskinesia because of some new medications t hat she is on.  She has a call into the doctor right now and hopes to hear something back soon.  They have narrowed down to the fact they think most of her discomfort in breathing is coming from the diaphragm and they are looking for alternatives for treatment.  The patient continues to do those things which help him cope including walking, fishing when he can, spending time with his son and daughter-in-law and grandson.  He also has  a way of continued coping with grief he is writing letters to his daughters as a way of expressing his feelings and says that is very beneficial.  He appears to be grieving in a healthy way but he knows that he to year anniversary of her death is coming up and there is some grief there but says it is not overwhelming.  We will continue to work on coping skills for helping with his anxiety and the progression of his Lewy body dementia  diagnosis as well as processing his grief. 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 Speech/Language:  Normal Rate Affect: Appropriate Mood: normal Thought process: normal Thought  content:  WNL Sensory/Perceptual disturbances:  WNL Orientation: oriented to person, place, and situation Attention: Good Concentration: Good Memory: Im paired short term Fund of knowledge:  Good Insight:  Good Judgment:  Good Impulse Control: Good  Risk Assessment: Danger to Self: No Self-injurious Behavior: No Danger to Others: No Duty to Warn:no Physical Aggression / Violence:No  Access to Firearms a concern: No  Gang Involvement:No  Treatment plan: Will employ cognitive behavioral therapy as well as grief and loss therapy for relief of anxiety and grief.  Goals of gr ief therapy or to have a healthier response to the loss of his daughter and less sadness as evidenced by patient report in therapy notes as well as to help him feel less overwhelmed by her loss.  Goals are to see a 50% reduction in grief symptoms over the next 6 months.  I will encourage the use of the patient telling his grief story about his daughter including encouraging him to bring pictures and memorabilia to help process his feeli ngs including any feelings of guilt associated with her loss.  We will use cognitive behavioral therapy to help identify and change anxiety producing thoughts and behavior patterns so as to improve his ability to better manage anxiety and stress, and manage thoughts and worrisome thinking contributing to anxiety.  We will also refresh and encourage dialectical behavior therapy distress tolerance and  mindfulness skills with the intention  of reducing anxiety by 50% over the next 6 months. Progress: 30% Interventions: Cognitive Behavioral Therapy  Diagnosis:PTSD, Bi-Polar D/O  Plan: F/U/ appointments scheduled weekly.  Cecile Coder, [facility]                                                                                                                    Cecile Coder,  [facility]  [place] [facility] Counselor/Therapist Progress Note  Patient ID: Minnie Shi, MRN: [id number],    Date: 04/01/2024 This session was held via video teletherapy. The patient consented to the video teletherapy and was located in his home during this session. He is aware it is the responsibility of the patient to secure confidentiality on his end of the session. The provider was in a private home office for the duration of this session.   The patient arrived on time for her Caregility session  Time Spent:  59 minutes, 2 PM to 2:59 PM  Treatment Type: Individual Therapy Reported Symptoms: anxiety, panic attacks The patient continues to present with physical pain and both his back and his knee.  He  started with a new physical therapist and told him that there was a lot of arthritis in both knees and or certain things he cannot do but they had him do that anyway and now for the past 3 days he has been in significant knee pain.  He has co ntinued to walk because he knows that is good for him in various ways but says it has been painful.  His wife also was not feeling well but she is going back to the doctor next week to have an ablation and hopes that will bring her some relief.  For the most part he has been at the last couple weeks reaching back out and hearing from some old friends as well as time with his son and son's family.  He says that his wife's cousin is comin g down for a weekend soon and they enjoy time with her and that he will go to his brother's house sometime in the next few weeks.  He recognizes the need for people that he cares about in helping with his depression. We will continue to work on coping skills for helping with his anxiety and the progression of his Lewy body dementia diagnosis as well as processing his grief. Mental Status Exam: Appearance:  Well Groomed  Behavior:  Appropriate Motor: Pt. Reports some instability due to Lewey Body dementia Speech/Language:  Normal Rate Affect: Appropriate Mood: normal Thought process: normal Thought content:  WNL Sensory/Perceptual disturbances:  WNL Orientation: oriented to person, place, and situation Attention: Good Concentration: Good Memory: Impaired short term Fund of knowledge:  Good Insight:  Good Judgment:  Good Impulse Control: Good  R isk Assessment: Danger to Self: No Self-injurious Behavior: No Danger to Others: No Duty to Warn:no Physical Aggression / Violence:No  Access to Firearms a concern: No  Gang Involvement:No  Treatment plan: Will employ cognitive behavioral therapy as well as grief and loss therapy for relief of anxiety and grief.  Goals of grief therapy or to have a healthier response to  the loss of his daughter and less sadness as evidenced by  patient report in therapy notes as well as to help him feel less overwhelmed by her loss.  Goals are to see a 50% reduction in grief symptoms over the next 6 months.  I will encourage the use of the patient telling his grief story about his daughter including encouraging him to bring pictures and memorabilia to help process his feelings including any feelings of guilt associated with her loss.  We will use cognitive behavioral therapy t o help identify and change anxiety producing thoughts and behavior patterns so as to improve his ability to better manage anxiety and stress, and manage thoughts and worrisome thinking contributing to anxiety.  We will  also refresh and encourage dialectical behavior therapy distress tolerance and mindfulness skills with the intention of reducing anxiety by 50% over the next 6 months. Progress: 30% Interventions: Cognitive Behavioral T herapy  Diagnosis:PTSD, Bi-Polar D/O  Plan: F/U/ appointments scheduled weekly.  Cecile Coder, [facility]                                                                                                                   Cecile Coder, [facility]               Cecile Coder, [name]                Cecile Coder, [facility]               Cecile Coder, [facility]  [place] [facility] Counselor/Therapist Prog ress Note  Patient ID: Donnald Tabar, MRN: [id number],    Date: 04/01/2024 This session was held via video teletherapy. The patient consented to the video teletherapy and was located in his home during this session. He is aware it is the responsibility of the patient to secure confidentiality on his end of the session. The provider was in a private home office for the duration of this session.   The patient arrived on time for  her Caregility session  Time Spent: 57 minutes, 2 PM to 2:57 PM  Treatment Type: Individual Therapy Reported Symptoms: anxiety, panic attacks The patient is still having significant back pain.  He cannot sit anywhere for very long and has to be careful where he sits.  There is still some unsteadiness although he did not report any falls over the past 2 weeks.  His biggest concern now is for his wife.  She has been having some diff iculty breathing and they did a test so they found a small hole on the back side of her heart.  She is meeting with her cardiologist again next week to see what possible treatment is  for her.  He still thinks there may be some issue with her diaphragm but she is meeting with the pulmonologist also.  He is doing what he can to help her.  His daughter's birthday is March 11 so he knows that is a difficult time for both he and his wife n ext week.  Typically they do something to remember her but because of the wife's current health issues they cannot go to [place] as they had planned so they are talking about what to do to remember and celebrate her birthday.  We will continue to work on coping skills for helping with his anxiety and the progression of his Lewy body dementia diagnosis as well as processing his grief. Mental Status Exam: Appearance:  Well Groom ed  Behavior: Appropriate Motor: Pt. Reports some instability due to Lewey Body dementia Speech/Language:  Normal Rate Affect: Appropriate Mood: normal Thought process: normal Thought content:  WNL Sensory/Perceptual disturbances:  WNL Orientation: oriented to person, place, and situation Attention: Good Concentration: Good Memory: Impaired short term Fund of knowledge:  Good Insight:  Good Judgment:  Good Impulse C ontrol: Good  Risk Assessment: Danger to Self: No Self-injurious Behavior: No Danger to Others: No Duty to Warn:no Physical Aggression / Violence:No  Access to Firearms a concern: No  Gang Involvement:No  Treatment plan: Will employ cognitive behavioral therapy as well as grief and loss therapy for relief of anxiety and grief.  Goals of grief therapy or to have a healthier response to the loss of his daughter and less sadness  as evidenced by patient report in therapy notes as well as to help him feel less overwhelmed by her loss.  Goals are to see a 50% reduction in grief symptoms over the next 6 months.  I will encourage the use of the patient telling his grief story about his daughter including encouraging him to bring pictures and memorabilia to help process his feelings including  any feelings of guilt associated with her loss.  We will use cognitive beh avioral therapy to help identify and change anxiety producing thoughts and behavior patterns so as to improve his ability to better manage anxiety and stress, and manage thoughts and worrisome thinking contributing to anxiety.  We will also refresh and encourage dialectical behavior therapy distress tolerance and mindfulness  skills with the intention of reducing anxiety by 50% over the next 6 months. Progress: 30% Interventions: Cogni tive Behavioral Therapy  Diagnosis:PTSD, Bi-Polar D/O  Plan: F/U/ appointments scheduled weekly.  Cecile Coder, [facility]                                                                                                                   Cecile Coder, [facility]  [place] [facility] Counselor/Therapist Progress Note  Patient ID: Satya Buttram, MRN: [id number],    Date: 04/01/2024 This session was held via video teletherapy.  The patient consented to the video teletherapy and was located in his home during this session. He is aware it is the responsibility of the patient to secure confidentiality on his end of the session. The provider was in a private home office for the duration of this session.   The patient arrived on time for her Caregility session  Time Spent: 58 minutes, 2 PM to 2:58 PM  Treatment Type: Individual Therapy Reported Symptoms: an xiety, panic attacks A fairly difficult week because it was his daughter's birthday.  He said this year was more difficult than last year but he could not pinpoint why.  They did remember her about going out to a nice restaurant which is something her daughter always wanted to do.  He said they were able to laugh and joke about funny things that his daughter had said or done which is not something they were able to do this time last ye ar so he saw that as healthy grieving.  He knows that the time change threw things off a little bit for everybody and his wife still is not feeling well and his back has been bothering him.  He was not able to go to his first round of physical therapy this week because his back was bothering him more.  He has been thinking a lot more about his daughter which he feels comfortable with.  He is thankful that it is getting warmer and stayin g light longer so he can get back to some of the coping skills such as fishing that he has done in the past.   We will continue to work on coping skills for helping with his anxiety  and the progression of his Lewy body dementia diagnosis as well as processing his grief. 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 Speech/Language:  Normal Ra te Affect: Appropriate Mood: normal Thought process: normal Thought content:  WNL Sensory/Perceptual disturbances:  WNL Orientation: oriented to person, place, and situation Attention: Good Concentration: Good Memory: Impaired short term Fund of knowledge:  Good Insight:  Good Judgment:  Good Impulse Control: Good  Risk Assessment: Danger to Self: No Self-injurious Behavior: No Danger to Others: No Duty to Warn:no  Physical Aggression / Violence:No  Access to Firearms a concern: No  Gang Involvement:No  Treatment plan: Will employ cognitive behavioral therapy as well as grief and loss therapy for relief of anxiety and grief.  Goals of grief therapy or to have a healthier response to the loss of his daughter and less sadness as evidenced by patient report in therapy notes as well as to help him feel less overwhelmed by her loss.  Goals are to  see a 50% reduction in grief symptoms over the next 6 months.  I will encourage the use of the patient telling his grief story about his daughter including encouraging him to bring pictures and memorabilia to help process his feelings including any feelings of guilt associated with her loss.  We will use cognitive behavioral therapy to help identify and change anxiety producing thoughts and behavior patterns so as to improve his ability  to better manage anxiety and stress, and manage thoughts and worrisome thinking contributing to anxiety.  We will also refresh and encourage dialectical behavior therapy distress  tolerance and mindfulness skills with the intention of reducing anxiety by 50% over the next 6 months. Progress: 30% Interventions: Cognitive Behavioral Therapy  Diagnosis:PTSD, Bi-Polar  D/O  Plan: F/U/ appointments scheduled weekly.  Cecile Coder, Rol[facility] Click [facility]                                                                                                                   Cecile Coder, [facility]               Cecile Coder, [facility]               Cecile Coder, [facility]  [place] [facility] Counselor/Therapist Progress Note  Patient ID: Celso Granja, MRN: [id number],    Date: 04/01/2024 This session was held via video teletherapy. The patient consented to the  video teletherapy and was located in his home during this session. He is aware it is the responsibility of the  patient to secure confidentiality on his end of the session. The provider was in a private home office for the duration of this session.   The patient arrived on time for her Caregility session  Time Spent: 57 minutes, 2 PM to 2:57 PM  Treatment Type: Individual Therapy Reported Symptoms: anxiety, panic attacks The pat ient is still having significant back pain.  He cannot sit anywhere for very long and has to be careful where he sits.  There is still some unsteadiness although he did not report any falls over the past 2 weeks.  His biggest concern now is for his wife.  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  She is meeting with her cardiologist again next week to see  what possible treatment is for her.  He still thinks there may be some issue with her diaphragm but she is meeting with the pulmonologist also.  He is doing what he can to help her.  His daughter's birthday is March 11 so he knows that is a difficult time for both he and his wife next week.  Typically they do something to remember her but because of the wife's current health issues they cannot go to [place] as they had planned  so they are talking about what to do to remember and celebrate her birthday.  We will continue to work on coping skills for helping with his anxiety and the progression of his Lewy body dementia diagnosis as well as processing his grief. 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 Speech/Language:  Normal Rate Affect: Appropriate Mood: nor mal Thought process: normal Thought content:  WNL Sensory/Perceptual disturbances:  WNL Orientation: oriented to person, place, and situation Attention: Good Concentration: Good Memory: Impaired short term Fund of knowledge:  Good Insight:  Good Judgment:  Good Impulse  Control: Good  Risk Assessment: Danger to Self: No Self-injurious Behavior: No Danger to Others: No Duty to Warn:no Physical Aggression / Violence:N o  Access to Firearms a concern: No  Gang Involvement:No  Treatment plan: Will employ cognitive behavioral therapy as well as grief and loss therapy for relief of anxiety and grief.  Goals of grief therapy or to have a healthier response to the loss of his daughter and less sadness as evidenced by patient report in therapy notes as well as to help him feel less overwhelmed by her loss.  Goals are to see a 50% reduction in grief sympt oms over the next 6 months.  I will encourage the use of the patient telling his grief story about his daughter including encouraging him to bring pictures and memorabilia to help process his feelings including any feelings of guilt associated with her loss.  We will use cognitive behavioral therapy to help identify and change anxiety producing thoughts and behavior patterns so as to improve his ability to better manage anxiety and stre ss, and manage thoughts and worrisome thinking contributing to anxiety.  We will also refresh and encourage dialectical behavior therapy distress tolerance and mindfulness  skills with the intention of reducing anxiety by 50% over the next 6 months. Progress: 30% Interventions: Cognitive Behavioral Therapy  Diagnosis:PTSD, Bi-Polar D/O  Plan: F/U/ appointments scheduled weekly.  Cecile Coder, [facility]                                                                                                                    Cecile Coder, [facility]  [place] [facility] Counselor/Therapist Progress Note  Patient ID: Dysen Edmondson, MRN: [id number],    Date: 04/01/2024 This session was held via video teletherapy. The patient consented to the video teletherapy and was located in his home during this session. He is aware it is the responsibility of the patient to sec ure confidentiality on his end of the session. The provider was in a private home office for the duration of this session.   The patient arrived on time for her Caregility session  Time Spent: 57 minutes, 2 PM to 2:57 PM  Treatment Type: Individual Therapy Reported Symptoms: anxiety, panic attacks  We will continue to work on coping skills for helping with his anxiety and the progression of his Lewy body dementia diagnosis as w ell as processing his grief. 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 Speech/Language:   Normal Rate Affect: Appropriate Mood: normal Thought process: normal Thought content:  WNL Sensory/Perceptual disturbances:  WNL Orientation: oriented to person, place, and situation Attention: Good Concentration: Good Memory: Impaired short t erm Fund of knowledge:  Good Insight:  Good Judgment:  Good Impulse Control: Good  Risk Assessment: Danger to Self: No Self-injurious Behavior: No Danger to Others: No Duty to Warn:no Physical Aggression / Violence:No  Access to Firearms a concern: No  Gang Involvement:No  Treatment plan: Will employ cognitive behavioral therapy as well as grief and loss therapy for relief of anxiety and grief.  Goals of grief therapy or  to have a healthier response to the loss of his daughter and less sadness as evidenced by patient report in therapy notes as well as to help him feel less overwhelmed by her loss.  Goals are to see a 50% reduction in grief symptoms over the next 6 months.  I will encourage the use of the patient telling his grief story about his daughter including encouraging him to bring pictures and memorabilia to help process his feelings including  any feelings of guilt associated with her loss.  We will use cognitive behavioral therapy to help identify and change anxiety producing thoughts and behavior patterns so as to improve his ability to better manage anxiety and stress, and manage thoughts and worrisome thinking contributing to anxiety.  We will also refresh and encourage dialectical behavior therapy distress tolerance and mindfulness skills with the intention of reducing a nxiety by 50% over the next 6 months. Progress: 30% Interventions: Cognitive Behavioral Therapy  Diagnosis:PTSD, Bi-Polar D/O  Plan: F/U/ appointments scheduled weekly.  Cecile Coder,  [facility]                                                                                                                   [address] [place], [facility]               Cecile Coder, [facility]               Cecile Coder, [facility]  Cecile Coder, [facility]               Cecile Coder, [facility]               Cecile Coder, [facility]               Cecile Coder, [facility]   Behavioral Health Counselor/Therapist Progress Note  Patient ID: Chaise Mahabir, MRN: [id number],    Date: 04/01/2024 This session was held via video teletherapy. The  patient consented to the video teletherapy and was located in his  home during this session. He is aware it is the responsibility of the patient to secure confidentiality on his end of the session. The provider was in a private home office for the duration of this session.   The patient arrived on time for her Caregility session  Time Spent: 39 minutes, 2 PM to 2:39 PM  Treatment Type: Individual Therapy Reported Symptoms: anxiety, panic attacks The patient was not feeling well today.  He star ted feeling really earlier in the week having a fever bad headaches body aches.  He felt a little better yesterday but feels a little bit of a resurgence in headaches and body aches and is running a low-grade fever.  His wife is feeling some of the same symptoms.  For the most part he says he is doing well.  They did increase his memory medication because they were starting to see some increasing decline in short-term memory and he said  until recently his long-term memory had been good but he was starting to see a difference in how he used to markers to remember what happened at certain times historically.  He feels the medication has helped and has brightened his mood a little also.  He is still trying to walk every day and fish when he can and has enjoyed watching the [employer] as a good distraction.  He reports that he feels he is managing mood fairly  well.  He has an appointment in 2 weeks.  He does contract for safety having no thoughts of hurting himself or anyone else. We will continue to work on coping skills for helping with his anxiety and the progression of his Lewy body dementia diagnosis as well as processing his grief. 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 Speech/Langua ge:  Normal Rate Affect: Appropriate Mood: normal Thought process: normal Thought content:   WNL Sensory/Perceptual disturbances:  WNL Orientation: oriented to person, place, and situation Attention: Good Concentration: Good Memory: Impaired short term Fund of knowledge:  Good Insight:  Good Judgment:  Good Impulse Control: Good  Risk Assessment: Danger to Self: No Self-injurious Behavior: No Danger to Others: No D uty to Warn:no Physical Aggression / Violence:No  Access to Firearms a concern: No  Gang Involvement:No  Treatment plan: Will employ cognitive behavioral therapy as well as grief and loss therapy for relief of anxiety and grief.  Goals of grief therapy or to have a healthier response to the loss of his daughter and less sadness as evidenced by patient report in therapy notes as well as to help him feel less overwhelmed by her loss.   Goals are to see a 50% reduction in grief symptoms over the next 6 months.  I will encourage the use of the patient telling his grief story about his daughter including encouraging him to bring pictures and memorabilia to help process his feelings including any feelings of guilt associated with her loss.  We will use cognitive behavioral therapy to help identify and change anxiety producing thoughts and behavior patterns so as to impro ve his ability to better manage anxiety and stress, and manage thoughts and worrisome thinking contributing to anxiety.  We will also refresh  and encourage dialectical behavior therapy distress tolerance and mindfulness skills with the intention of reducing anxiety by 50% over the next 6 months. Progress: 30% Interventions: Cognitive Behavioral Therapy  Diagnosis:PTSD, Bi-Polar D/O  Plan: F/U/ appointments scheduled weekly.  Cra ig Kelly Patient, [facility]                                                                                                                   Cecile Coder,  [facility]  [place] [facility] Counselor/Therapist Progress Note  Patient ID: Eames Dibiasio, MRN: [id number],    Date: 04/01/2024 This session was held via video teletherapy. The patient consented to the video teletherapy and was located in his home during this session. He is awar e it is the responsibility of the patient to secure confidentiality on his end of the session. The provider was in a private home office for the duration of this session.   The patient arrived on time for her Caregility session  Time Spent: 58 minutes, 2 PM to 2:58 PM  Treatment Type: Individual Therapy Reported Symptoms: anxiety, panic attacks A fairly difficult week because it was his daughter's birthday.  He said this year wa s more  difficult than last year but he could not pinpoint why.  They did remember her about going out to a nice restaurant which is something her daughter always wanted to do.  He said they were able to laugh and joke about funny things that his daughter had said or done which is not something they were able to do this time last year so he saw that as healthy grieving.  He knows that the time change threw things off a little bit for eve rybody and his wife still is not feeling well and his back has been bothering him.  He was not able to go to his first round of physical therapy this week because his back was bothering him more.  He has been thinking a lot more about his daughter which he feels comfortable with.  He is thankful that it is getting warmer and staying light longer so he can get back to some of the coping skills such as fishing that he has done in the past .   We will continue to work on coping skills for helping with his anxiety and the progression of his Lewy body dementia diagnosis as well as processing his grief. 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 Speech/Language:  Normal Rate Affect: Appropriate Mood: normal Thought process: normal Thought content:  WNL Sensory/Perceptual  disturbances:  WNL Orientation: oriented to person, place, and situation Attention: Good Concentration: Good Memory: Impaired short term Fund of knowledge:  Good Insight:  Good Judgment:  Good Impulse Control: Good  Risk Assessment: Danger to Self: No Self-injurious Behavior: No Danger to Others: No Duty to Warn:no Physical Aggression / Violence:No  Access to Firearms a concern: No  Gang Involvement:No  Treatment pl an: Will employ cognitive behavioral therapy as well as grief and loss therapy for relief of anxiety and grief.  Goals of grief therapy or to have a healthier response to the loss of his  daughter and less sadness as evidenced by patient report in therapy notes as well as to help him feel less overwhelmed by her loss.  Goals are to see a 50% reduction in grief symptoms over the next 6 months.  I will encourage the use of the patient tell ing his grief story about his daughter including encouraging him to bring pictures and memorabilia to help process his feelings including any feelings of guilt associated with her loss.  We will use cognitive behavioral therapy to help identify and change anxiety producing thoughts and behavior patterns so as to improve his ability to better manage anxiety and stress, and manage thoughts and worrisome thinking contributing to anxiety.   We will also refresh and encourage dialectical behavior therapy  distress tolerance and mindfulness skills with the intention of reducing anxiety by 50% over the next 6 months. Progress: 30% Interventions: Cognitive Behavioral Therapy  Diagnosis:PTSD, Bi-Polar D/O  Plan: F/U/ appointments scheduled weekly.  Cecile Coder, [facility]                                                                                                                    Cecile Coder, [facility]               Cecile Coder, [facility]               Cecile Coder, [facility]  [place] [facility] Counselor/Therapist Progress Note  Patient ID: Alakai Macbride, MRN: [id number],    Date: 04/01/2024 This session was held via video teletherapy. The patient consented to the video teletherapy and was located in his home during this session. He is aware it is the responsibility of the patient  to secure confidentiality on his end of the session. The provider was in a private home office for the duration of this session.   The patient arrived on time for her Caregility session  Time Spent: 1:05 PM until 2 PM, 55 minutes Treatment Type: Individual Therapy Reported Symptoms: anxiety, panic attacks The patient said he came in from his walk this morning and saw what he thought was some sort of tic from his wife.  It was n ot the first time it happened and he wonders if it might be tardive dyskinesia because of some new medications that she is on.  She has a call into the doctor right now and hopes to  hear something back soon.  They have narrowed down to the fact they think most of her discomfort in breathing is coming from the diaphragm and they are looking for alternatives for treatment.  The patient continues to do those things which help him cope in cluding walking, fishing when he can, spending time with his son and daughter-in-law and grandson.  He also has a way of continued coping with grief he is writing letters to his daughters as a way of expressing his feelings and says that is very beneficial.  He appears to be grieving in a healthy way but he knows that he to year anniversary of her death is coming up and there is some grief there but says it is not overwhelming.  We wi ll continue to work on coping skills for helping with his anxiety and the progression of his Lewy body dementia diagnosis as well as processing his grief. 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 Speech/Language:  Normal Rate Affect: Appropriate Mood: normal Thought process: normal Thought content:  WNL Sensory/Perceptual disturbances :  WNL Orientation: oriented to person, place, and situation Attention: Good Concentration: Good Memory: Impaired short term Fund of knowledge:  Good Insight:  Good Judgment:  Good Impulse Control: Good  Risk Assessment: Danger to Self: No Self-injurious Behavior: No Danger to Others: No Duty to Warn:no Physical Aggression / Violence:No  Access to Firearms a concern: No  Gang Involvement:No  Treatment plan: Will emp loy cognitive behavioral therapy as well as grief and loss therapy for relief of anxiety and grief.  Goals of grief therapy or to have a healthier response to the loss of his daughter and less sadness as evidenced by patient report in therapy notes as well as to help him feel less overwhelmed by her loss.  Goals are to see a 50% reduction in grief symptoms over the  next 6 months.  I will encourage the use of the patient telling his grie f story about his daughter including encouraging him to bring pictures and memorabilia to help process his feelings including any feelings of guilt associated with her loss.  We will use cognitive behavioral therapy to help identify and change anxiety producing thoughts and behavior patterns so as to improve his ability to better manage anxiety and stress, and manage thoughts and worrisome thinking contributing to anxiety.  We will also  refresh and encourage dialectical behavior therapy distress tolerance  and mindfulness skills with the intention of reducing anxiety by 50% over the next 6 months. Progress: 30% Interventions: Cognitive Behavioral Therapy  Diagnosis:PTSD, Bi-Polar D/O  Plan: F/U/ appointments scheduled weekly.  Cecile Coder, [facility]                                                                                                                    Cecile Coder, [facility]  [place] [facility] Counselor/Therapist Progress Note  Patient ID: Jahmil Macleod, MRN: [id number],    Date: 04/01/2024 This session was held via video teletherapy. The patient consented to the video teletherapy and was located in his home during this session. He is aware it is the responsibility of the patient to secure confidentiality on his end of the session. The provider was in a private home off ice for the duration of this session.   The patient arrived on time for her Caregility session  Time Spent: 59 minutes, 2 PM to 2:59 PM  Treatment Type: Individual Therapy Reported Symptoms: anxiety, panic attacks The patient continues to present with physical pain and both his back and his knee.  He started with a new physical therapist and told him that there was a lot of arthritis in both knees and or certain things he cannot  do but they had him do that anyway and now for the past 3 days he has been in significant knee pain.  He has continued to walk because he knows that is good for him in various ways but says it has been painful.  His wife also was not feeling well but she is going back to the doctor next week to have an ablation and hopes that will bring her some relief.  For the most part he has been at the last couple weeks reaching back out and heari ng from some old friends as well as time with his son and son's family.  He says that his wife's cousin is coming down for a weekend soon and they enjoy time with her and that he will  go to his brother's house sometime in the next few weeks.  He recognizes the need for people that he cares about in helping with his depression. We will continue to work on coping skills for helping with his anxiety and the progression of his Lewy body de mentia diagnosis as well as processing his grief. 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 Speech/Language:  Normal Rate Affect: Appropriate Mood: normal Thought process: normal Thought content:  WNL Sensory/Perceptual disturbances:  WNL Orientation: oriented to person, place, and situation Attention: Good Concentration: Good Mem ory: Impaired short term Fund of knowledge:  Good Insight:  Good Judgment:  Good Impulse Control: Good  Risk Assessment: Danger to Self: No Self-injurious Behavior: No Danger to Others: No Duty to Warn:no Physical Aggression / Violence:No  Access to Firearms a concern: No  Gang Involvement:No  Treatment plan: Will employ cognitive behavioral therapy as well as grief and loss therapy for relief of anxiety and grief.  Goal s of grief therapy or to have a healthier response to the loss of his daughter and less sadness as evidenced by patient report in therapy notes as well as to help him feel less overwhelmed by her loss.  Goals are to see a 50% reduction in grief symptoms over the next 6 months.  I will encourage the use of the patient telling his grief story about his daughter including encouraging him to bring pictures and memorabilia to help process hi s feelings including any feelings of guilt associated with her loss.  We will use cognitive behavioral therapy to help identify and change anxiety producing thoughts and behavior patterns so as to improve his ability to better manage anxiety and stress, and manage thoughts and worrisome thinking contributing to anxiety.  We will also refresh  and encourage dialectical  behavior therapy distress tolerance and mindfulness skills with the in tention of reducing anxiety by 50% over the next 6 months. Progress: 30% Interventions: Cognitive Behavioral Therapy  Diagnosis:PTSD, Bi-Polar D/O  Plan: F/U/ appointments scheduled weekly.  Cecile Coder, [facility]                                                                                                                    Cecile Coder, [facility]               Cecile Coder, [facility]               Cecile Coder, [facility]               Cecile Coder, [facility]  [facility] Counselor/Therapist  Progress Note  Patient ID: Kelcey Wickstrom, MRN: [id number],    Date: 04/01/2024 This session was held via video teletherapy. The patient consented to the video teletherapy and was located in his home during this session. He is aware it is the responsibility of the patient to secure confidentiality on his end of the session.  The provider was in a private home office for the duration of this session.   The patient arrived on time for her Caregility session  Time Spent: 57 minutes, 2 PM to 2:57 PM  Treatment Type: Individual Therapy Reported Symptoms: anxiety, panic attacks The patient is still having significant back pain.  He cannot sit anywhere for very long and has to be careful where he sits.  There is still some unsteadiness although he did not  report any falls over the past 2 weeks.  His biggest concern now is for his wife.  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  She is meeting with her cardiologist again next week to see what possible treatment is for her.  He still thinks there may be some issue with her diaphragm but she is meeting with the pulmonologist also.  He is doing what he can to  help her.  His daughter's birthday is March 11 so he knows that is a difficult time for both he and his wife next week.  Typically they do something to remember her but because of the wife's current health issues they cannot go to [place] as they had planned so they are talking about what to do to remember and celebrate her birthday.  We will continue to work on coping skills for helping with his anxiety and the progression of  his Lewy body dementia diagnosis as well as processing his grief. 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 Speech/Language:  Normal Rate Affect: Appropriate Mood: normal Thought  process: normal Thought content:  WNL Sensory/Perceptual disturbances:  WNL Orientation: oriented to person, place, and situation Attention: Good Concent ration: Good Memory: Impaired short term Fund of knowledge:  Good Insight:  Good Judgment:  Good Impulse Control: Good  Risk Assessment: Danger to Self: No Self-injurious Behavior: No Danger to Others: No Duty to Warn:no Physical Aggression / Violence:No  Access to Firearms a concern: No  Gang Involvement:No  Treatment plan: Will employ cognitive behavioral therapy as well as grief and loss therapy for relief of anxiety  and grief.  Goals of grief therapy or to have a healthier response to the loss of his daughter and less sadness as evidenced by patient report in therapy notes as well as to help him feel less overwhelmed by her loss.  Goals are to see a 50% reduction in grief symptoms over the next 6 months.  I will encourage the use of the patient telling his grief story about his daughter including encouraging him to bring pictures and memorabilia t o help process his feelings including any feelings of guilt associated with her loss.  We will use cognitive behavioral therapy to help identify and change anxiety producing thoughts and behavior patterns so as to improve his ability to better manage anxiety and stress, and manage thoughts and worrisome thinking contributing to anxiety.  We will also refresh and encourage dialectical behavior therapy distress tolerance and mindfulness s  kills with the intention of reducing anxiety by 50% over the next 6 months. Progress: 30% Interventions: Cognitive Behavioral Therapy  Diagnosis:PTSD, Bi-Polar D/O  Plan: F/U/ appointments scheduled weekly.  Cecile Coder, [facility]                                                                                                                     Cecile Coder, [facility]  [facility] Counselor/Therapist Progress Note  P atient ID: Marie Chow, MRN: [id number],    Date: 04/01/2024 This session was held via video teletherapy. The patient consented to the video teletherapy and was located in his home during this session. He is aware it is the responsibility of the patient to secure confidentiality on his end of the session. The provider was in a private home office for the duration of this session.   The patient arrived on time for her Caregilit y session  Time Spent: 58 minutes, 2 PM to 2:58 PM  Treatment Type: Individual Therapy Reported Symptoms: anxiety, panic attacks A fairly difficult week because it was his  daughter's birthday.  He said this year was more difficult than last year but he could not pinpoint why.  They did remember her about going out to a nice restaurant which is something her daughter always wanted to do.  He said they were able to laugh and joke abo ut funny things that his daughter had said or done which is not something they were able to do this time last year so he saw that as healthy grieving.  He knows that the time change threw things off a little bit for everybody and his wife still is not feeling well and his back has been bothering him.  He was not able to go to his first round of physical therapy this week because his back was bothering him more.  He has been thinking a l ot more about his daughter which he feels comfortable with.  He is thankful that it is getting warmer and staying light longer so he can get back to some of the coping skills such as fishing that he has done in the past.   We will continue to work on coping skills for helping with his anxiety and the progression of his Lewy body dementia diagnosis as well as processing his grief. 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 Speech/Language:  Normal Rate Affect: Appropriate Mood: normal Thought process: normal Thought content:  WNL Sensory/Perceptual disturbances:  WNL Orientation: oriented to person, place, and situation Attention: Good Concentration: Good Memory: Impaired short term Fund of knowledge:  Good Insight:  Good Judgment:  Good Impulse Control: G ood  Risk Assessment: Danger to Self: No Self-injurious Behavior: No Danger to Others: No Duty to Warn:no Physical Aggression / Violence:No  Access to Firearms a concern: No  Gang Involvement:No  Treatment plan: Will employ cognitive behavioral therapy as well as grief and loss therapy for relief of anxiety and grief.  Goals of grief therapy or to  have a healthier response to the loss of his daughter and less sadness as evide nced by patient report in therapy notes as well as to help him feel less overwhelmed by her loss.  Goals are to see a 50% reduction in grief symptoms over the next 6 months.  I will encourage the use of the patient telling his grief story about his daughter including encouraging him to bring pictures and memorabilia to help process his feelings including any feelings of guilt associated with her loss.  We will use cognitive behavioral t herapy to help identify and change anxiety producing thoughts and behavior patterns so as to improve his ability to better manage anxiety and stress, and manage thoughts and worrisome thinking contributing to anxiety.  We will also refresh and encourage dialectical behavior therapy  distress tolerance and mindfulness skills with the intention of reducing anxiety by 50% over the next 6 months. Progress: 30% Interventions: Cognitive Beha vioral Therapy  Diagnosis:PTSD, Bi-Polar D/O  Plan: F/U/ appointments scheduled weekly.  Cecile Coder, [facility]                                                                                                                   Cecile Coder, [facility]               Cecile Coder, [facility]                Cecile Coder, [facility]  [facility] Counselor/Therapist Progress Note  Patient ID: Chozen Latulippe,  MRN: [id number],    Date: 04/01/2024 This session was held via video teletherapy. The patient consented to the video teletherapy and was located in his home during this session. He is aware it is the responsibility of the patient to secure confidentiality on his end of the session. The provider was in a private home office for the duration of this session.   The patient arrived on time for her Caregility session  Time Spent: 57 m inutes, 2 PM to 2:57 PM  Treatment Type: Individual Therapy Reported Symptoms: anxiety, panic attacks The patient is still having significant back pain.  He cannot sit anywhere for very long and has to be careful where he sits.  There is still some unsteadiness although he did not report any falls over the past 2 weeks.  His biggest concern now is for his wife.  She has been having some difficulty breathing and they did a test so the y found a small hole on the back side of her heart.  She is meeting with her cardiologist again next week to see what possible treatment is for her.  He still thinks there may be some  issue with her diaphragm but she is meeting with the pulmonologist also.  He is doing what he can to help her.  His daughter's birthday is March 11 so he knows that is a difficult time for both he and his wife next week.  Typically they do something to r emember her but because of the wife's current health issues they cannot go to [place] as they had planned so they are talking about what to do to remember and celebrate her birthday.  We will continue to work on coping skills for helping with his anxiety and the progression of his Lewy body dementia diagnosis as well as processing his grief. Mental Status Exam: Appearance:  Well Groomed  Behavior: Appropriate Motor: Pt. Re ports some instability due to Lewey Body dementia Speech/Language:  Normal Rate Affect: Appropriate Mood: normal Thought process: normal Thought content:  WNL Sensory/Perceptual disturbances:  WNL Orientation: oriented to person, place, and situation Attention: Good Concentration: Good Memory: Impaired short term Fund of knowledge:  Good Insight:  Good Judgment:  Good Impulse Control: Good  Risk Assessment: Danger to  Self: No Self-injurious Behavior: No Danger to Others: No Duty to Warn:no Physical Aggression / Violence:No  Access to Firearms a concern: No  Gang Involvement:No  Treatment plan: Will employ cognitive behavioral therapy as well as grief and loss therapy for relief of anxiety and grief.  Goals of grief therapy or to have a healthier response to the loss of his daughter and less sadness as evidenced by patient report in therapy  notes as well as to help him feel less overwhelmed by her loss.  Goals are to see a 50% reduction in grief symptoms over the next 6 months.  I will encourage the use of the patient telling his grief story about his daughter including encouraging him to bring pictures and memorabilia to help process his feelings including any feelings of guilt associated with her  loss.  We will use cognitive behavioral therapy to help identify and change  anxiety producing thoughts and behavior patterns so as to improve his ability to better manage anxiety and stress, and manage thoughts and worrisome thinking contributing to anxiety.  We will also refresh and encourage dialectical behavior therapy distress tolerance and mindfulness  skills with the intention of reducing anxiety by 50% over the next 6 months. Progress: 30% Interventions: Cognitive Behavioral Therapy  Diagnosis:PTSD,  Bi-Polar D/O  Plan: F/U/ appointments scheduled weekly.  Cecile Coder, [facility]                                                                                                                   Cecile Coder, [facility]  [facility] Counselor/Therapist Progress Note  Patient ID: Jandiel Magallanes, MRN: [id number],    Date: 04/01/2024 This session was held via video teletherapy. The patient consented to the video telethe rapy and was located in his home during this session. He is aware it is the responsibility of the patient to secure confidentiality on his end of the session. The provider was in a private home office for the duration of this session.   The patient arrived on time for her Caregility session  Time Spent: 57 minutes, 2 PM to 2:57 PM  Treatment Type: Individual Therapy Reported Symptoms: anxiety, panic attacks  The patient reporte d that he had not been sleeping well so he did speak with his psychiatrist about adjusting some meds.  Initially they adjusted too much up and he said he felt groggy for the entire next day but they have found a combination now where he feels like he is getting more rested and only feels a little drowsy shortly after waking up..  There have been a couple times where he stumbled and one of the times he felt down but did not get hurt the  other times he was able to catch himself.  He is trying to stay as active as he can walking daily and fishing with his wife when he can.  His biggest anxiety now is his wife has an upcoming surgery on her diaphragm in 2 weeks so he knows that will be a fairly significant recovery time but also sees the difficulties she is having now and wants her to get some relief..  His son and daughter-in-law will be supports for them also.  He conti nues to speak with friends and family members as encouragement.  He also feels that he is grieving appropriately writing to his daughter on a regular basis about the things that are  going on in his and his wife's lives.  We will continue to work on coping skills for helping with his anxiety and the progression of his Lewy body dementia diagnosis as well as processing his grief. Mental Status Exam: Appearance:  Well Groomed  Behav ior: Appropriate Motor: Pt. Reports some instability due to Lewey Body dementia Speech/Language:  Normal Rate Affect: Appropriate Mood: normal Thought process: normal Thought content:  WNL Sensory/Perceptual disturbances:  WNL Orientation: oriented to person, place, and situation Attention: Good Concentration: Good Memory: Impaired short term Fund of knowledge:  Good Insight:  Good Judgment:  Good Impulse Control: Good   Risk Assessment: Danger to Self: No Self-injurious Behavior: No Danger to Others: No Duty to Warn:no Physical Aggression / Violence:No  Access to Firearms a concern: No  Gang Involvement:No  Treatment plan: Will employ cognitive behavioral therapy as well as grief and loss therapy for relief of anxiety and grief.  Goals of grief therapy or to have a healthier response to the loss of his daughter and less sadness as evidence d by patient report in therapy notes as well as to help him feel less overwhelmed by her loss.  Goals are to see a 50% reduction in grief symptoms over the next 6 months.  I will encourage the use of the patient telling his grief story about his daughter including encouraging him to bring pictures and memorabilia to help process his feelings including any feelings of guilt associated with her loss.  We will use cognitive behavioral ther apy to help identify and change anxiety producing thoughts and behavior patterns so as to improve his ability to  better manage anxiety and stress, and manage thoughts and worrisome thinking contributing to anxiety.  We will also refresh and encourage dialectical behavior therapy distress tolerance and mindfulness skills with the intention of reducing anxiety by 50%  over the next 6 months. Progress: 30% Interventions: Cognitive Behavio ral Therapy  Diagnosis:PTSD, Bi-Polar D/O  Plan: F/U/ appointments scheduled weekly.  Cecile Coder, [facility]                                                                                                                   Cecile Coder, [facility]               Cecile Coder, [facility]                Cecile Coder, [facility]               Cecile Coder, [id number]               Cecile Coder, [facility]               Cecile Coder, [facility]               Cecile Coder, [facility]               Cecile Coder, [facility]   [facility] Counselor/Therapist Progress Note  Patient ID: Bacilio Abascal, MRN: [id number],    Date: 04/01/2024 This session was held via video teletherapy. The patient consented to the video teletherapy and was located in his home during this session. He is aw are it is the responsibility of the patient to secure confidentiality on his end of the session. The provider was in a private home office for the duration of this session.   The patient arrived on time for her Caregility session  Time Spent: 59 minutes, 2 PM to 2:59 PM  Treatment Type: Individual Therapy Reported Symptoms: anxiety, panic attacks The patient said he jokingly said to his wife that he had not had any pain issues i n the cervical area and then a few days ago he started having pain in his neck and shoulders.  He now sits related to degenerative disk and has some consistent pain somewhere in his spine with that but jokingly says it just depends on Wednesday which part of his body is decides to show up and hurt.  He is doing the best that he can.  He is still walking which he knows is good for him but does not have right now the strength to do as muc h in the morning as he has done in the past so he is denying that to 3 times a day for consistency.  He is still doing some positive things and that he is staying in touch with his brother and other family members.  He is reconnecting with friends from high school and he is which she is enjoying.  One of them has had a similar experience in that she lost her son in the same way the patient lost his daughter.  He said they did not think  and Easter in which they set a place for his daughter at the table and put her picture there.  He initially had reservations about doing that he and his wife had made everyone feel as  if she was really there with him.  He still feels that he is grieving in a healthy way.  He and his wife have always taken a trip around both of their birthdays which are 2 days apart.  That is now close to his daughter died so they have started taking som e of her rashes with him and sprinkling them where they go as a way of remembrance.  They are deciding what that looks like this year in July.  He does contract for safety having no thoughts of hurting himself or anyone else. We will continue to work on coping skills for helping with his anxiety and the progression of his Lewy body dementia diagnosis as well as processing his grief. 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 Speech/Language:  Normal Rate Affect: Appropriate Mood: normal Thought process: normal Thought content:  WNL Sensory/Perceptual disturbances:  WNL Orientation: oriented to person, place, and situation Attention: Good Concentration: Good Memory: Impaired short term Fund of knowledge:  Good Insight:  Good Judgment:  Good Impulse Control : Good  Risk Assessment: Danger to Self: No Self-injurious Behavior: No Danger to Others: No Duty to Warn:no Physical Aggression / Violence:No  Access to Firearms a concern: No  Gang Involvement:No  Treatment plan: Will employ cognitive behavioral therapy as well as grief and loss therapy for relief of anxiety and grief.  Goals of grief therapy or  to have a healthier response to the loss of his daughter and less sadness as ev idenced by patient report in therapy notes as well as to help him feel less overwhelmed by her loss.  Goals are to see a 50% reduction in grief symptoms over the next 6 months.  I will encourage the use of the patient telling his grief story about his daughter including encouraging him to bring pictures and memorabilia to help process his feelings including any  feelings of guilt associated with her loss.  We will use cognitive behaviora l therapy to help identify and change anxiety producing thoughts and behavior patterns so as to improve his ability to better manage anxiety and stress, and manage thoughts and worrisome thinking contributing to anxiety.  We will also refresh and encourage dialectical behavior therapy distress tolerance and mindfulness skills with the intention of reducing anxiety by 50% over the next 6 months. Progress: 30% Interventions: Cognitive B ehavioral Therapy  Diagnosis:PTSD, Bi-Polar D/O  Plan: F/U/ appointments scheduled weekly.  Cecile Coder, [facility]                                                                                                                   Cecile Coder, [facility]  [place] [facility] Counselor/Therapist Progress Note  Patient ID: Santana Edell, MRN: [id number],    Date: 04/01/2024 This session was held via video teletherapy. The p atient consented to the video teletherapy and was located in his home during this session. He is aware it is the responsibility of the patient to secure confidentiality on his end of the session. The provider was in a private home office for the duration of this session.   The patient arrived on time for her Caregility session  Time Spent: 58 minutes, 2 PM to 2:58 PM  Treatment Type: Individual Therapy Reported Symptoms: anxiety,  panic attacks A fairly difficult week because it was his daughter's birthday.  He said this year was more difficult than last year but he could not pinpoint why.  They did remember her about going out to a nice restaurant which is something her daughter always wanted to do.  He said they were able to laugh and joke about funny things that his daughter had said or done which is not something they were able to do this time last year so  he saw that as healthy grieving.  He knows that the time change threw things off a little bit for everybody and his wife still is not feeling well and his back has been bothering him.  He was not able to go to his first round of physical therapy this week because his back was bothering him more.  He has been thinking a lot more about his daughter which he feels comfortable with.  He is thankful that it is getting warmer and staying ligh t longer so he can get back to some of the coping skills such as fishing that he has done in the past.   We will continue to work on coping skills for helping with his anxiety and  the progression of his Lewy body dementia diagnosis as well as processing his grief. 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 Speech/Language:  Normal Rate Af fect: Appropriate Mood: normal Thought process: normal Thought content:  WNL Sensory/Perceptual disturbances:  WNL Orientation: oriented to person, place, and situation Attention: Good Concentration: Good Memory: Impaired short term Fund of knowledge:  Good Insight:  Good Judgment:  Good Impulse Control: Good  Risk Assessment: Danger to Self: No Self-injurious Behavior: No Danger to Others: No Duty to Warn:no Phys ical Aggression / Violence:No  Access to Firearms a concern: No  Gang Involvement:No  Treatment plan: Will employ cognitive behavioral therapy as well as grief and loss therapy for relief of anxiety and grief.  Goals of grief therapy or to have a healthier response to the loss of his daughter and less sadness as evidenced by patient report in therapy notes as well as to help him feel less overwhelmed by her loss.  Goals are to see a  50% reduction in grief symptoms over the next 6 months.  I will encourage the use of the patient telling his grief story about his daughter including encouraging him to bring pictures and memorabilia to help process his feelings including any feelings of guilt associated with her loss.  We will use cognitive behavioral therapy to help identify and change anxiety producing thoughts and behavior patterns so as to improve his ability to be tter manage anxiety and stress, and manage thoughts and worrisome thinking contributing to anxiety.  We will also refresh and encourage dialectical behavior therapy distress  tolerance and mindfulness skills with the intention of reducing anxiety by 50% over the next 6 months. Progress: 30% Interventions: Cognitive Behavioral Therapy  Diagnosis:PTSD, Bi-Polar  D/O  Plan: F/U/ appointments scheduled weekly.  Cecile Coder, [facility]                                                                                                                    Cecile Coder, [facility]               Cecile Coder, [facility]               Cecile Coder, [facility]  [place] [facility] Counselor/Therapist Progress Note  Patient ID: Euan Wandler, MRN: [id number],    Date: 04/01/2024 This session was held via video teletherapy. The patient consented to the video teletherapy  and was located in his home during this session. He is aware it is the responsibility of the patient to  secure confidentiality on his end of the session. The provider was in a private home office for the duration of this session.   The patient arrived on time for her Caregility session  Time Spent: 1:05 PM until 2 PM, 55 minutes Treatment Type: Individual Therapy Reported Symptoms: anxiety, panic attacks The patient said he cam e in from his walk this morning and saw what he thought was some sort of tic from his wife.  It was not the first time it happened and he wonders if it might be tardive dyskinesia because of some new medications that she is on.  She has a call into the doctor right now and hopes to hear something back soon.  They have narrowed down to the fact they think most of her discomfort in breathing is coming from the diaphragm and they are looki ng for alternatives for treatment.  The patient continues to do those things which help him cope including walking, fishing when he can, spending time with his son and daughter-in-law and grandson.  He also has a way of continued coping with grief he is writing letters to his daughters as a way of expressing his feelings and says that is very beneficial.  He appears to be grieving in a healthy way but he knows that he to year annivers ary of her death is coming up and there is some grief there but says it is not overwhelming.  We will continue to work on coping skills for helping with his anxiety and the progression of his Lewy body dementia diagnosis as well as processing his grief. 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 Speech/Language:  Normal Rate Affect: Approp riate Mood: normal Thought process: normal Thought content:  WNL Sensory/Perceptual disturbances:  WNL Orientation: oriented to person, place, and situation Attention: Good Concentration: Good Memory: Impaired short term Fund of knowledge:  Good Insight:  Good Judgment:   Good Impulse Control: Good  Risk Assessment: Danger to Self: No Self-injurious Behavior: No Danger to Others: No Duty to Warn:no Physical Aggress ion / Violence:No  Access to Firearms a concern: No  Gang Involvement:No  Treatment plan: Will employ cognitive behavioral therapy as well as grief and loss therapy for relief of anxiety and grief.  Goals of grief therapy or to have a healthier response to the loss of his daughter and less sadness as evidenced by patient report in therapy notes as well as to help him feel less overwhelmed by her loss.  Goals are to see a 50% reductio n in grief symptoms over the next 6 months.  I will encourage the use of the patient telling his grief story about his daughter including encouraging him to bring pictures and memorabilia to help process his feelings including any feelings of guilt associated with her loss.  We will use cognitive behavioral therapy to help identify and change anxiety producing thoughts and behavior patterns so as to improve his ability to better manage  anxiety and stress, and manage thoughts and worrisome thinking contributing to anxiety.  We will also refresh and encourage dialectical behavior therapy distress tolerance  and mindfulness skills with the intention of reducing anxiety by 50% over the next 6 months. Progress: 30% Interventions: Cognitive Behavioral Therapy  Diagnosis:PTSD, Bi-Polar D/O  Plan: F/U/ appointments scheduled weekly.  Cecile Coder, [facility]                                                                                                                    Cecile Coder, [facility]  [place] [facility] Counselor/Therapist Progress Note  Patient ID: Hiroki Wint, MRN: [id number],    Date: 04/01/2024 This session was held via video teletherapy. The patient consented to the video teletherapy and was located in his home during this session. He is aware it is the responsibility of th e patient to secure confidentiality on his end of the session. The provider was in a private home office for the duration of this session.   The patient arrived on time for her Caregility session  Time Spent: 59 minutes, 2 PM to 2:59 PM  Treatment Type: Individual Therapy Reported Symptoms: anxiety, panic attacks The patient continues to present with physical pain and both his back and his knee.  He started with a new physical t herapist and told him that there was a lot of arthritis in both knees and or certain things he cannot do but they had him do that anyway and now for the past 3 days he has been in  significant knee pain.  He has continued to walk because he knows that is good for him in various ways but says it has been painful.  His wife also was not feeling well but she is going back to the doctor next week to have an ablation and hopes that will bring  her some relief.  For the most part he has been at the last couple weeks reaching back out and hearing from some old friends as well as time with his son and son's family.  He says that his wife's cousin is coming down for a weekend soon and they enjoy time with her and that he will go to his brother's house sometime in the next few weeks.  He recognizes the need for people that he cares about in helping with his depression. We will c ontinue to work on Pharmacologist for helping with his anxiety and the progression of his Lewy body dementia diagnosis as well as processing his grief. 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 Speech/Language:  Normal Rate Affect: Appropriate Mood: normal Thought process: normal Thought content:  WNL Sensory/Perceptual disturbances:   WNL Orientation: oriented to person, place, and situation Attention: Good Concentration: Good Memory: Impaired short term Fund of knowledge:  Good Insight:  Good Judgment:  Good Impulse Control: Good  Risk Assessment: Danger to Self: No Self-injurious Behavior: No Danger to Others: No Duty to Warn:no Physical Aggression / Violence:No  Access to Firearms a concern: No  Gang Involvement:No  Treatment plan: Will employ  cognitive behavioral therapy as well as grief and loss therapy for relief of anxiety and grief.  Goals of grief therapy or to have a healthier response to the loss of his daughter and less sadness as evidenced by patient report in therapy notes as well as to help him feel less overwhelmed by her loss.  Goals are to see a 50% reduction in grief symptoms over the  next 6 months.  I will encourage the use of the patient telling his grief st ory about his daughter including encouraging him to bring pictures and memorabilia to help process his feelings including any feelings of guilt associated with her loss.  We will use cognitive behavioral therapy to help identify and change anxiety producing thoughts and behavior patterns so as to improve his ability to better manage anxiety and stress, and manage thoughts and worrisome thinking contributing to anxiety.  We will also  ref resh and encourage dialectical behavior therapy distress tolerance and mindfulness skills with the intention of reducing anxiety by 50% over the next 6 months. Progress: 30% Interventions: Cognitive Behavioral Therapy  Diagnosis:PTSD, Bi-Polar D/O  Plan: F/U/ appointments scheduled weekly.  Cecile Coder, [facility]                                                                                                                    Cecile Coder, [facility]               Cecile Coder, [facility]               Cecile Coder, [facility]               Cecile Coder, [facility]  [place] [facility] Counselor/Therapist Progress Note  Patient ID: Lunden Mcleish, MRN: [id number],    Date: 04/01/2024 This session was held via video teletherapy. The patient consented to the video teletherapy and was located in his home during this session. He i s aware it is the responsibility of the patient to secure confidentiality on his end of the session. The provider was in a private home office for the duration of this session.   The patient arrived on time for her Caregility session  Time Spent: 57 minutes, 2 PM to 2:57 PM  Treatment Type: Individual Therapy Reported Symptoms: anxiety, panic attacks The patient is still having significant back pain.  He cannot sit anywhere for  very long and has to be careful where he sits.  There is still some unsteadiness although he did not report any falls over the past 2 weeks.  His biggest concern now is for his wife.  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  She is meeting with her cardiologist again next week to see what possible treatment is for her.  He still thinks there may be some  issue with her diaphragm but she is meeting with the pulmonologist also.  He is doing what he can to help her.  His daughter's birthday is March 11 so he knows that is a difficult  time for both he and his wife next week.  Typically they do something to remember her but because of the wife's current health issues they cannot go to [place] as they had planned so they are talking about what to do to remember and celebrate her birth day.  We will continue to work on coping skills for helping with his anxiety and the progression of his Lewy body dementia diagnosis as well as processing his grief. 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 Speech/Language:  Normal Rate Affect: Appropriate Mood: normal Thought process: normal Thought content:  WNL Sensory/Perceptual  disturbances:  WNL Orientation: oriented to person, place, and situation Attention: Good Concentration: Good Memory: Impaired short term Fund of knowledge:  Good Insight:  Good Judgment:  Good Impulse Control: Good  Risk Assessment: Danger to Self: No Self-injurious Behavior: No Danger to Others: No Duty to Warn:no Physical Aggression / Violence:No  Access to Firearms a concern: No  Gang Involvement:No  Treatment p lan: Will employ cognitive behavioral therapy as well as grief and loss therapy for relief of anxiety and grief.  Goals of grief therapy or to have a healthier response to the loss of his daughter and less sadness as evidenced by patient report in therapy notes as well as to help him feel less overwhelmed by her loss.  Goals are to see a 50% reduction in grief symptoms over the next 6 months.  I will encourage the use of the patient tel ling his grief story about his daughter including encouraging him to bring pictures and memorabilia to help process his feelings including any feelings of guilt associated with her loss.  We will use cognitive behavioral therapy to help identify and change anxiety producing thoughts and behavior patterns so as to improve his ability to better manage anxiety and  stress, and manage thoughts and worrisome thinking contributing to anxiety.   We will also refresh and encourage dialectical behavior therapy distress tolerance and mindfulness  skills with the intention of reducing anxiety by 50% over the next 6 months. Progress: 30% Interventions: Cognitive Behavioral Therapy  Diagnosis:PTSD, Bi-Polar D/O  Plan: F/U/ appointments scheduled weekly.  Cecile Coder, [facility]                                                                                                                    Cecile Coder, [facility]  [place] [facility] Counselor/Therapist Progress Note  Patient  ID: Vang Kraeger, MRN: [id number],    Date: 04/01/2024 This session was held via video teletherapy. The patient consented to the video teletherapy and was located in his home during this session. He is aware it is the responsibility of the patient to secure confidentiality on his end of the session. The provider was in a pri vate home office for the duration of this session.   The patient arrived on time for her Caregility session  Time Spent: 58 minutes, 2 PM to 2:58 PM  Treatment Type: Individual Therapy Reported Symptoms: anxiety, panic attacks A fairly difficult week because it was his daughter's birthday.  He said this year was more difficult than last year but he could not pinpoint why.  They did remember her about going out to a nice restaura nt which is something her daughter always wanted to do.  He said they were able to laugh and joke about funny things that his daughter had said or done which is not something they were able to do this time last year so he saw that as healthy grieving.  He knows that the time change threw things off a little bit for everybody and his wife still is not feeling well and his back has been bothering him.  He was not able to go to his first r ound of physical therapy this week because his back was bothering him more.  He has been thinking a lot more about his daughter which he feels comfortable with.  He is thankful that it is getting warmer and staying light longer so he can get back to some of the coping skills such as fishing that he has done in the past.   We will continue to work on coping skills for helping with his anxiety and the progression of his Lewy body demen tia diagnosis as well as processing his grief. 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 Speech/Language:  Normal Rate Affect: Appropriate Mood: normal Thought process: normal Thought content:   WNL Sensory/Perceptual disturbances:  WNL Orientation: oriented to person, place, and situation Attention: Good Concentration: Good Memory : Impaired short term Fund of knowledge:  Good Insight:  Good Judgment:  Good Impulse Control: Good  Risk Assessment: Danger to Self: No Self-injurious Behavior: No Danger to Others: No Duty to Warn:no Physical Aggression / Violence:No  Access to Firearms a concern: No  Gang Involvement:No  Treatment plan: Will employ cognitive behavioral therapy as well as grief and loss therapy for relief of anxiety and grief.  Goals o f grief therapy or to have a healthier response to the loss of his daughter and less sadness as evidenced by patient report in therapy notes as well as to help him feel less overwhelmed by her loss.  Goals are to see a 50% reduction in grief symptoms over the next 6 months.  I will encourage the use of the patient telling his grief story about his daughter including encouraging him to bring pictures and memorabilia to help process his f eelings including any feelings of guilt associated with her loss.  We will use cognitive behavioral therapy to help identify and change anxiety producing thoughts and behavior patterns so as to improve his ability to better manage anxiety and stress, and manage thoughts and worrisome thinking contributing to anxiety.  We will also refresh and encourage dialectical behavior therapy distress  tolerance and mindfulness skills with the inten tion of reducing anxiety by 50% over the next 6 months. Progress: 30% Interventions: Cognitive Behavioral Therapy  Diagnosis:PTSD, Bi-Polar D/O  Plan: F/U/ appointments scheduled weekly.  Cecile Coder, [facility]                                                                                                                    Cecile Coder,  [facility]               Cecile Coder, [facility]               Cecile Coder, [facility]  [place] [facility] Counselor/Therapist Progress Note  Patient ID: Ruairi Stutsman, MRN: [id number],    Date: 04/01/2024 This session was held via video teletherapy. The patient consented to the video teletherapy and was located in his home during this session. He is aware it is the responsibility of the patient to secure confidentiality on his end of the session. The provider was in a private home office for the dura tion of this session.   The patient arrived on time for her Caregility session  Time Spent: 57 minutes, 2 PM to 2:57 PM  Treatment Type: Individual Therapy Reported Symptoms: anxiety, panic  attacks The patient is still having significant back pain.  He cannot sit anywhere for very long and has to be careful where he sits.  There is still some unsteadiness although he did not report any falls over the past 2 weeks.  His biggest c oncern now is for his wife.  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  She is meeting with her cardiologist again next week to see what possible treatment is for her.  He still thinks there may be some issue with her diaphragm but she is meeting with the pulmonologist also.  He is doing what he can to help her.  His daughter's birthday is March 11 so he  knows that is a difficult time for both he and his wife next week.  Typically they do something to remember her but because of the wife's current health issues they cannot go to [place] as they had planned so they are talking about what to do to remember and celebrate her birthday.  We will continue to work on coping skills for helping with his anxiety and the progression of his Lewy body dementia diagnosis as well as processin g his grief. 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 Speech/Language:  Normal Rate Affect: Appropriate Mood: normal Thought process: normal Thought content:  WNL Sensory/Perceptual disturbances:  WNL Orientation: oriented to person, place, and situation Attention: Good Concentration: Good Memory: Impaired short term Fund of kno wledge:  Good Insight:  Good Judgment:  Good Impulse Control: Good  Risk Assessment: Danger to Self: No Self-injurious Behavior: No Danger to Others: No Duty to Warn:no Physical Aggression / Violence:No  Access to Firearms a concern: No  Gang Involvement:No  Treatment plan: Will employ cognitive behavioral therapy as well as grief and loss therapy for relief of  anxiety and grief.  Goals of grief therapy or to have a healt hier response to the loss of his daughter and less sadness as evidenced by patient report in therapy notes as well as to help him feel less overwhelmed by her loss.  Goals are to see a 50% reduction in grief symptoms over the next 6 months.  I will encourage the use of the patient telling his grief story about his daughter including encouraging him to bring pictures and memorabilia to help process his feelings including any feelings of  guilt associated with her loss.  We will use cognitive behavioral therapy to help identify and change anxiety producing thoughts and behavior patterns so as to improve his ability to better manage anxiety and stress, and manage thoughts and worrisome thinking contributing to anxiety.  We will also refresh and encourage dialectical behavior therapy distress tolerance and mindfulness skills  with the intention of reducing anxiety by 50% ov er the next 6 months. Progress: 30% Interventions: Cognitive Behavioral Therapy  Diagnosis:PTSD, Bi-Polar D/O  Plan: F/U/ appointments scheduled weekly.  Cecile Coder, [facility]                                                                                                                   Cecile Coder, [facility]  [place] [facility] Counselor/Therapist Progress Note  Patient ID: Aniello Christopoulos, MRN: [id number],    Dat e: 04/01/2024 This session was held via video teletherapy. The patient consented to the video teletherapy and was located in his home during this session. He is aware it is the responsibility of the patient to secure confidentiality on his end of the session. The provider was in a private home office for the duration of this session.   The patient arrived on time for her Caregility session  Time Spent: 57 minutes, 2 PM to 2:57 PM   Treatment Type: Individual Therapy Reported Symptoms: anxiety, panic attacks  We will continue to work on coping skills for helping with his anxiety and the progression of his Lewy body dementia diagnosis as well as processing his grief. 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 Speech/Language:  Normal Rate Affect: Appropriate Mood: n ormal Thought process: normal Thought content:  WNL Sensory/Perceptual disturbances:  WNL Orientation: oriented to person, place, and  situation Attention: Good Concentration: Good Memory: Impaired short term Fund of knowledge:  Good Insight:  Good Judgment:  Good Impulse Control: Good  Risk Assessment: Danger to Self: No Self-injurious Behavior: No Danger to Others: No Duty to Warn:no Physical Aggression / Violence :No  Access to Firearms a concern: No  Gang Involvement:No  Treatment plan: Will employ cognitive behavioral therapy as well as grief and loss therapy for relief of anxiety and grief.  Goals of grief therapy or to have a healthier response to the loss of his daughter and less sadness as evidenced by patient report in therapy notes as well as to help him feel less overwhelmed by her loss.  Goals are to see a 50% reduction in grief sym ptoms over the next 6 months.  I will encourage the use of the patient telling his grief story about his daughter including encouraging him to bring pictures and memorabilia to help process his feelings including any feelings of guilt associated with her loss.  We will use cognitive behavioral therapy to help identify and change anxiety producing thoughts and behavior patterns so as to improve his ability to better manage anxiety and st ress, and manage thoughts and worrisome thinking contributing to anxiety.  We will also refresh and encourage dialectical behavior therapy distress tolerance and mindfulness skills with the intention of reducing anxiety by 50% over the next 6 months. Progress: 30% Interventions: Cognitive Behavioral Therapy  Diagnosis:PTSD, Bi-Polar D/O  Plan: F/U/ appointments scheduled weekly.  Cecile Coder, [facility]                                                                                                                    Cecile Coder, [facility]               Cecile Coder, [facility]               Cecile Coder,  [facility]  Cecile Coder, [facility]               Cecile Coder, [facility]                Cecile Coder, [facility]               Cecile Coder, [facility]  [place] [facility] Counsel or/Therapist Progress Note  Patient ID: Nochum Fenter, MRN: [id number],    Date: 04/01/2024 This session was held via video teletherapy. The patient consented to the video teletherapy and was located in his home during this session. He is aware it is the responsibility of the patient to secure confidentiality on his end of the session. The provider was in a private home office for the duration of this session.   The patient ar rived on time for her Caregility session  Time Spent: 39  minutes, 2 PM to 2:39 PM  Treatment Type: Individual Therapy Reported Symptoms: anxiety, panic attacks The patient was not feeling well today.  He started feeling really earlier in the week having a fever bad headaches body aches.  He felt a little better yesterday but feels a little bit of a resurgence in headaches and body aches and is running a low-grade fever.  His wife i s feeling some of the same symptoms.  For the most part he says he is doing well.  They did increase his memory medication because they were starting to see some increasing decline in short-term memory and he said until recently his long-term memory had been good but he was starting to see a difference in how he used to markers to remember what happened at certain times historically.  He feels the medication has helped and has brightene d his mood a little also.  He is still trying to walk every day and fish when he can and has enjoyed watching the [employer] as a good distraction.  He reports that he feels he is managing mood fairly well.  He has an appointment in 2 weeks.  He does contract for safety having no thoughts of hurting himself or anyone else. We will continue to work on coping skills for helping with his anxiety and the progression of his L ewy body dementia diagnosis as well as processing his grief. 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 Speech/Language:  Normal Rate Affect: Appropriate Mood: normal Thought process: normal Thought content:  WNL Sensory/Perceptual disturbances:  WNL Orientation: oriented to person, place, and situation Attention: Good Concentration : Good Memory: Impaired short term Fund of knowledge:  Good Insight:  Good Judgment:  Good Impulse Control: Good  Risk Assessment: Danger to Self: No Self-injurious Behavior: No Danger to Others: No Duty to Warn:no Physical  Aggression / Violence:No  Access to Firearms a concern: No  Gang Involvement:No  Treatment plan: Will employ cognitive behavioral therapy as well as grief and loss therapy for relief of anxiety and g rief.  Goals of grief therapy or to have a healthier response to the loss of his daughter and less sadness as evidenced by patient report in therapy notes as well as to help him feel less overwhelmed by her loss.  Goals are to see a 50% reduction in grief symptoms over the next 6 months.  I will encourage the use of the patient telling his grief story about his daughter including encouraging him to bring pictures and memorabilia to help  process his feelings including any feelings of guilt associated with her loss.  We will use cognitive behavioral therapy to help identify and change anxiety producing thoughts and behavior patterns so as to improve his ability to better manage anxiety and stress, and manage thoughts and worrisome thinking contributing to anxiety.  We will also refresh  and encourage dialectical behavior therapy distress tolerance and mindfulness skills  with the intention of reducing anxiety by 50% over the next 6 months. Progress: 30% Interventions: Cognitive Behavioral Therapy  Diagnosis:PTSD, Bi-Polar D/O  Plan: F/U/ appointments scheduled weekly.  Cecile Coder, [facility]                                                                                                                    Cecile Coder,  [facility]  [facility] Counselor/Therapist Progress Note  Patient  ID: Xzaiver Vayda, MRN: [id number],    Date: 04/01/2024 This session was held via video teletherapy. The patient consented to the video teletherapy and was located in his home during this session. He is aware it is the responsibility of the patient to secure confidentiality on his end of the session. The provider was in a private home office for the duration of this session.   The patient arrived on time for her Caregility sess ion  Time Spent: 58 minutes, 2 PM to 2:58 PM  Treatment Type: Individual Therapy Reported Symptoms: anxiety, panic attacks A fairly difficult week because it was his daughter's birthday.  He said this year was more  difficult than last year but he could not pinpoint why.  They did remember her about going out to a nice restaurant which is something her daughter always wanted to do.  He said they were able to laugh and joke about fun ny things that his daughter had said or done which is not something they were able to do this time last year so he saw that as healthy grieving.  He knows that the time change threw things off a little bit for everybody and his wife still is not feeling well and his back has been bothering him.  He was not able to go to his first round of physical therapy this week because his back was bothering him more.  He has been thinking a lot mor e about his daughter which he feels comfortable with.  He is thankful that it is getting warmer and staying light longer so he can get back to some of the coping skills such as fishing that he has done in the past.   We will continue to work on coping skills for helping with his anxiety and the progression of his Lewy body dementia diagnosis as well as processing his grief. 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 Speech/Language:  Normal Rate Affect: Appropriate Mood: normal Thought process: normal Thought content:  WNL Sensory/Perceptual disturbances:  WNL Orientation: oriented to person, place, and situation Attention: Good Concentration: Good Memory: Impaired short term Fund of knowledge:  Good Insight:  Good Judgment:  Good Impulse Control: Good   Risk Assessment: Danger to Self: No Self-injurious Behavior: No Danger to Others: No Duty to Warn:no Physical Aggression / Violence:No  Access to Firearms a concern: No  Gang Involvement:No  Treatment plan: Will employ cognitive behavioral therapy as well as grief and loss therapy for relief of anxiety and grief.  Goals of grief therapy or to have a healthier response to the loss of his  daughter and less sadness as evidenced b y patient report in therapy notes as well as to help him feel less overwhelmed by her loss.  Goals are to see a 50% reduction in grief symptoms over the next 6 months.  I will encourage the use of the patient telling his grief story about his daughter including encouraging him to bring pictures and memorabilia to help process his feelings including any feelings of guilt associated with her loss.  We will use cognitive behavioral therapy  to help identify and change anxiety producing thoughts and behavior patterns so as to improve his ability to better manage anxiety and stress, and manage thoughts and worrisome thinking contributing to anxiety.  We will also refresh and encourage dialectical behavior therapy  distress tolerance and mindfulness skills with the intention of reducing anxiety by 50% over the next 6 months. Progress: 30% Interventions: Cognitive Behavioral  Therapy  Diagnosis:PTSD, Bi-Polar D/O  Plan: F/U/ appointments scheduled weekly.  Cecile Coder, [facility]                                                                                                                   Cecile Coder, [facility]               Cecile Coder, [facility]                Cecile Coder, [facility]  [facility] Counselor/Therapist Progress Note  Patient ID: Habeeb Puertas, MRN: [id number],     Date: 04/01/2024 This session was held via video teletherapy. The patient consented to the video teletherapy and was located in his home during this session. He is aware it is the responsibility of the patient to secure confidentiality on his end of the session. The provider was in a private home office for the duration of this session.   The patient arrived on time for her Caregility session  Time Spent: 1:05 PM until 2 PM, 55  minutes Treatment Type: Individual Therapy Reported Symptoms: anxiety, panic attacks The patient said he came in from his walk this morning and saw what he thought was some sort of tic from his wife.  It was not the first time it happened and he wonders if it might be tardive dyskinesia because of some new medications that she is on.  She has a call into the doctor right now and hopes to hear something back soon.  They have narrowed  down to the fact they think most of her discomfort in breathing is coming from the diaphragm and they are looking for alternatives for treatment.  The patient continues to do those  things which help him cope including walking, fishing when he can, spending time with his son and daughter-in-law and grandson.  He also has a way of continued coping with grief he is writing letters to his daughters as a way of expressing his feelings and  says that is very beneficial.  He appears to be grieving in a healthy way but he knows that he to year anniversary of her death is coming up and there is some grief there but says it is not overwhelming.  We will continue to work on coping skills for helping with his anxiety and the progression of his Lewy body dementia diagnosis as well as processing his grief. 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 Speech/Language:  Normal Rate Affect: Appropriate Mood: normal Thought process: normal Thought content:  WNL Sensory/Perceptual disturbances:  WNL Orientation: oriented to person, place, and situation Attention: Good Concentration: Good Memory: Impaired short term Fund of knowledge:  Good Insight:  Good Judgment:  Good Impulse Control: Good  Risk Assess ment: Danger to Self: No Self-injurious Behavior: No Danger to Others: No Duty to Warn:no Physical Aggression / Violence:No  Access to Firearms a concern: No  Gang Involvement:No  Treatment plan: Will employ cognitive behavioral therapy as well as grief and loss therapy for relief of anxiety and grief.  Goals of grief therapy or to have a healthier response to the loss of his daughter and less sadness as evidenced by patient re port in therapy notes as well as to help him feel less overwhelmed by her loss.  Goals are to see a 50% reduction in grief symptoms over the next 6 months.  I will encourage the use of the patient telling his grief story about his daughter including encouraging him to bring pictures and memorabilia to help process his feelings including any feelings of guilt  associated with her loss.  We will use cognitive behavioral therapy to help ide ntify and change anxiety producing thoughts and behavior patterns so as to improve his ability to better manage anxiety and stress, and manage thoughts and worrisome thinking contributing to anxiety.  We will also refresh and encourage dialectical behavior therapy distress tolerance  and mindfulness skills with the intention of reducing anxiety by 50% over the next 6 months. Progress: 30% Interventions: Cognitive Behavioral Therapy   Diagnosis:PTSD, Bi-Polar D/O  Plan: F/U/ appointments scheduled weekly.  Cecile Coder, [facility]                                                                                                                   Cecile Coder, [facility]  [facility] Counselor/Therapist Progress Note  Patient ID: Landers Prajapati, MRN: [id number],    Date: 04/01/2024 This session was held via video teletherapy. The patient consented to t he video teletherapy and was located in his home during this session. He is aware it is the responsibility of the patient to secure confidentiality on his end of the session. The provider was in a private home office for the duration of this session.   The patient arrived on time for her Caregility session  Time Spent: 59 minutes, 2 PM to 2:59 PM  Treatment Type: Individual Therapy Reported Symptoms: anxiety, panic attacks The p atient continues to present with physical pain and both his back and his knee.  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  He has continued to walk because he knows that is good for him in various ways but says it has been painful.  His wife also  was not feeling well but she is going back to the doctor next week to have an ablation and hopes that will bring her some relief.  For the most part he has been at the last couple weeks reaching back out and hearing from some old friends as well as time with his son and son's family.  He says that his wife's cousin is coming down for a weekend soon and they enjoy time with her and that he will go to his brother's house sometime in the n ext few weeks.  He recognizes the need for people that he cares about in helping with his depression. We will continue to work on coping skills for helping with his anxiety and the  progression of his Lewy body dementia diagnosis as well as processing his grief. 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 Speech/Language:  Normal Rate Affect:  Appropriate Mood: normal Thought process: normal Thought content:  WNL Sensory/Perceptual disturbances:  WNL Orientation: oriented to person, place, and situation Attention: Good Concentration: Good Memory: Impaired short term Fund of knowledge:  Good Insight:  Good Judgment:  Good Impulse Control: Good  Risk Assessment: Danger to Self: No Self-injurious Behavior: No Danger to Others: No Duty to Warn:no Physical  Aggression / Violence:No  Access to Firearms a concern: No  Gang Involvement:No  Treatment plan: Will employ cognitive behavioral therapy as well as grief and loss therapy for relief of anxiety and grief.  Goals of grief therapy or to have a healthier response to the loss of his daughter and less sadness as evidenced by patient report in therapy notes as well as to help him feel less overwhelmed by her loss.  Goals are to see a 50% r eduction in grief symptoms over the next 6 months.  I will encourage the use of the patient telling his grief story about his daughter including encouraging him to bring pictures and memorabilia to help process his feelings including any feelings of guilt associated with her loss.  We will use cognitive behavioral therapy to help identify and change anxiety producing thoughts and behavior patterns so as to improve his ability to better  manage anxiety and stress, and manage thoughts and worrisome thinking contributing to anxiety.  We will also  refresh and encourage dialectical behavior therapy distress tolerance and mindfulness skills with the intention of reducing anxiety by 50% over the next 6 months. Progress: 30% Interventions: Cognitive Behavioral Therapy  Diagnosis:PTSD, Bi-Polar  D/O  Plan: F/U/ appointments scheduled weekly.  Cecile Coder, [facility]                                                                                                                    Cecile Coder, [facility]               Cecile Coder, [facility]               Cecile Coder, [facility]               Cecile Coder, [facility]  Cecile Coder,  [facility]  [facility] Counselor/Therapist Progress Note  Patient ID: Delane Wessinger, MRN: [id number],    Date: 04/01/2024 This session was held via video teletherapy. The patient consented to the video teletherapy and was located in his home during this session. He is aware it is the responsibility of the patient to secure confidentiality on his end of the session. The provider was in a private home office for the duration of this session.   The patient arrived on time for her Caregility session   Time Spent: 58 minutes, 2 PM to 2:58 PM  Treatment Type: Individual Therapy Reported Symptoms: anxiety, panic attacks A fairly difficult week because it was his daughter's birthday.  He said this year was more  difficult than last year but he could not pinpoint why.  They did remember her about going out to a nice restaurant which is something her daughter always wanted to do.  He said they were able to laugh and joke about funny thi ngs that his daughter had said or done which is not something they were able to do this time last year so he saw that as healthy grieving.  He knows that the time change threw things off a little bit for everybody and his wife still is not feeling well and his back has been bothering him.  He was not able to go to his first round of physical therapy this week because his back was bothering him more.  He has been thinking a lot more abou t his daughter which he feels comfortable with.  He is thankful that it is getting warmer and staying light longer so he can get back to some of the coping skills such as fishing that he has done in the past.   We will continue to work on coping skills for helping with his anxiety and the progression of his Lewy body dementia diagnosis as well as processing his grief. Mental Status Exam: Appearance:  Well Groomed  Behavior: Appr opriate Motor: Pt. Reports some instability due to Lewey Body dementia Speech/Language:  Normal Rate Affect: Appropriate Mood: normal Thought process: normal Thought content:  WNL Sensory/Perceptual disturbances:  WNL Orientation: oriented to person, place, and situation Attention: Good Concentration: Good Memory: Impaired short term Fund of knowledge:  Good Insight:  Good Judgment:  Good Impulse Control: Good  Risk  Assessment: Danger to Self: No Self-injurious Behavior: No Danger to Others: No Duty to Warn:no Physical Aggression / Violence:No  Access to Firearms a concern: No  Gang Involvement:No  Treatment plan: Will employ cognitive behavioral therapy as well as grief and loss therapy for relief of anxiety and grief.  Goals of grief therapy or to have a healthier response to the loss of his  daughter and less sadness as evidenced by pati ent report in therapy notes as well as to help him feel less overwhelmed by her loss.  Goals are to see a 50% reduction in grief symptoms over the next 6 months.  I will encourage the use of the patient telling his grief story about his daughter including encouraging him to bring pictures and memorabilia to help process his feelings including any feelings of guilt associated with her loss.  We will use cognitive behavioral therapy to he lp identify and change anxiety producing thoughts and behavior patterns so as to improve his ability to better manage anxiety and stress, and manage thoughts and worrisome thinking contributing to anxiety.  We will also refresh and encourage dialectical behavior therapy  distress tolerance and mindfulness skills with the intention of reducing anxiety by 50% over the next 6 months. Progress: 30% Interventions: Cognitive Behavioral Thera py  Diagnosis:PTSD, Bi-Polar D/O  Plan: F/U/ appointments scheduled weekly.  Cecile Coder, [facility]                                                                                                                   Cecile Coder, [facility]               Cecile Coder, [facility]               Crai g Kelly Patient, [facility]  [facility] Counselor/Therapist Progress Note  Patient ID: Philipe Laswell, MRN: [id number],    Date: 04/01/2024 This session was held via video teletherapy. The patient consented to the video teletherapy and was located in his home during this session. He is aware it is the responsibility of the patient to secure confidentiality on his end of the session. The provider was in a private home office for the duration of this session.   The patient arrived on time for her Caregility session  Time Spent: 57 minutes, 2 PM  to 2:57 PM  Treatment Type: Individual Therapy Reported Symptoms: anxiety, panic attacks The patient is still having significant back pain.  He cannot sit anywhere for very long and has to be careful where he sits.  There is still some unsteadiness although he did not report any falls over the past 2 weeks.  His biggest concern now is for his wife.  She has been having some difficulty breathing and they did a test so they found a sm all hole on the back side of her heart.  She is meeting with her cardiologist again next week to see what possible treatment is for her.  He still thinks there may be some issue with  her diaphragm but she is meeting with the pulmonologist also.  He is doing what he can to help her.  His daughter's birthday is March 11 so he knows that is a difficult time for both he and his wife next week.  Typically they do something to remember her  but because of the wife's current health issues they cannot go to [place] as they had planned so they are talking about what to do to remember and celebrate her birthday.  We will continue to work on coping skills for helping with his anxiety and the progression of his Lewy body dementia diagnosis as well as processing his grief. Mental Status Exam: Appearance:  Well Groomed  Behavior: Appropriate Motor: Pt. Reports some i nstability due to Lewey Body dementia Speech/Language:  Normal Rate Affect: Appropriate Mood: normal Thought process: normal Thought content:  WNL Sensory/Perceptual disturbances:  WNL Orientation: oriented to person, place, and situation Attention: Good Concentration: Good Memory: Impaired short term Fund of knowledge:  Good Insight:  Good Judgment:  Good Impulse Control: Good  Risk Assessment: Danger to Self: No  Self-injurious Behavior: No Danger to Others: No Duty to Warn:no Physical Aggression / Violence:No  Access to Firearms a concern: No  Gang Involvement:No  Treatment plan: Will employ cognitive behavioral therapy as well as grief and loss therapy for relief of anxiety and grief.  Goals of grief therapy or to have a healthier response to the loss of his daughter and less sadness as evidenced by patient report in therapy notes as wel l as to help him feel less overwhelmed by her loss.  Goals are to see a 50% reduction in grief symptoms over the next 6 months.  I will encourage the use of the patient telling his grief story about his daughter including encouraging him to bring pictures and memorabilia to help process his feelings including any feelings of guilt associated with her loss.  We will  use cognitive behavioral therapy to help identify and change anxiety pro ducing thoughts and behavior patterns so as to improve his ability to better manage anxiety and stress, and manage thoughts and worrisome thinking contributing to anxiety.  We will also refresh and encourage dialectical behavior therapy distress tolerance and mindfulness  skills with the intention of reducing anxiety by 50% over the next 6 months. Progress: 30% Interventions: Cognitive Behavioral Therapy  Diagnosis:PTSD, Bi-Polar D/O   Plan: F/U/ appointments scheduled weekly.  Cecile Coder, [facility]                                                                                                                   Cecile Coder, [facility]  [place] [place] [facility] Counselor/Therapist Progress Note  Patient ID: Milton Sagona, MRN: [id number],    Date: 04/01/2024 This session was held via video teletherapy. The patient consented to the video teletherapy and was located in  his home during this session. He is aware it is the responsibility of the patient to secure confidentiality on his end of the session. The provider was in a private home office for the duration of this session.   The patient arrived on time for her Caregility session  Time Spent: 57 minutes, 2 PM to 2:57 PM  Treatment Type: Individual Therapy Reported Symptoms: anxiety, panic attacks  The patient's wife continues to heal and im prove.  For the most part physically he has done fairly well this week.  The hardest part has been that thy had tp put their  had to put their dog to sleep. Aaron AasHe feels that he is coping well but indicated that it did stir up some feelings  He had been in failing health for a while and was about 63 years old. It did bring sadness to him.  For the most part it has been a good week. He is still walking. He is writing in his journal about  his daughter. Hi anxiety in manageable and depression mild.  He does contract for safety. 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 Speech/Language:  Normal Rate Affect: Appropriate Mood: normal Thought process: normal Thought content:  WNL Sensory/Perceptual disturbances:  WNL Orientation: oriented to person, place, and situation  Attention: Good Concentration: Good Memory: Impaired short term Fund of knowledge:  Good Insight:  Good Judgment:  Good Impulse Control: Good  Risk Assessment: Danger to  Self: No Self-injurious Behavior: No Danger to Others: No Duty to Warn:no Physical Aggression / Violence:No  Access to Firearms a concern: No  Gang Involvement:No  Treatment plan: Will employ cognitive behavioral therapy as well as grief and loss ther apy for relief of anxiety and grief.  Goals of grief therapy or to have a healthier response to the loss of his daughter and less sadness as evidenced by patient report in therapy notes as well as to help him feel less overwhelmed by her loss.  Goals are to see a 50% reduction in grief symptoms over the next 6 months.  I will encourage the use of the patient telling his grief story about his daughter including encouraging him to bring p ictures and memorabilia to help process his feelings including any feelings of guilt associated with her loss.  We will use cognitive behavioral therapy to help identify and change anxiety producing thoughts and behavior patterns so as to improve his ability to better manage anxiety and stress, and manage thoughts and worrisome thinking contributing to anxiety.  We will also refresh and encourage dialectical behavior therapy distress to lerance and mindfulness skills with the intention of reducing anxiety by 50% over the next 6 months. Progress: 35% Reviewed target goals with a new target date of October 31st, 2025. Interventions: Cognitive Behavioral Therapy  Diagnosis:PTSD, Bi-Polar D/O  Plan: F/U/ appointments scheduled weekly.  Cecile Coder, [facility]  Cecile Coder, [facility]                   Cecile Coder, [facility]               Cecile Coder, [facility]               Cecile Coder, [facility]               Cecile Coder,  [facility]  [facility] Counselor/Therapist Progress Note  Patient ID: Sherlock Nancarrow, MRN: [id number],    Date: 04/01/2024 This session was held via video teletherapy. The patient consented to the video telethera py and was located in his home during this session. He is aware it is the responsibility of the patient to secure confidentiality on his end of the session. The provider was in a private home office for the duration of this session.   The patient arrived on time for her Caregility session  Time Spent: 59 minutes, 2 PM to 2:59 PM  Treatment Type: Individual Therapy Reported Symptoms: anxiety, panic attacks The patient said he jok ingly said to his wife that he had not had any pain issues in the cervical area and then a  few days ago he started having pain in his neck and shoulders.  He now sits related to degenerative disk and has some consistent pain somewhere in his spine with that but jokingly says it just depends on Wednesday which part of his body is decides to show up and hurt.  He is doing the best that he can.  He is still walking which he knows is good f or him but does not have right now the strength to do as much in the morning as he has done in the past so he is denying that to 3 times a day for consistency.  He is still doing some positive things and that he is staying in touch with his brother and other family members.  He is reconnecting with friends from high school and he is which she is enjoying.  One of them has had a similar experience in that she lost her son in the same way  the patient lost his daughter.  He said they did not think and Easter in which they set a place for his daughter at the table and put her picture there.  He initially had reservations about doing that he and his wife had made everyone feel as if she was really there with him.  He still feels that he is grieving in a healthy way.  He and his wife have always taken a trip around both of their birthdays which are 2 days apart.  That is no w close to his daughter died so they have started taking some of her rashes with him and sprinkling them where they go as a way of remembrance.  They are deciding what that looks like this year in July.  He does contract for safety having no thoughts of hurting himself or anyone else. We will continue to work on coping skills for helping with his anxiety and the progression of his Lewy body dementia diagnosis as well as processing hi s grief. 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 Speech/Language:  Normal Rate Affect: Appropriate Mood: normal Thought process: normal Thought content:   WNL Sensory/Perceptual disturbances:  WNL Orientation: oriented to person, place, and situation Attention: Good Concentration: Good Memory: Impaired short term Fund of knowled ge:  Good Insight:  Good Judgment:  Good Impulse Control: Good  Risk Assessment: Danger to Self: No Self-injurious Behavior: No Danger to Others: No Duty to Warn:no Physical Aggression / Violence:No  Access to Firearms a concern: No  Gang Involvement:No  Treatment plan: Will employ cognitive behavioral therapy as well as grief and loss therapy for relief of anxiety and grief.  Goals of grief therapy  or to have a healthier  response to the loss of his daughter and less sadness as evidenced by patient report in therapy notes as well as to help him feel less overwhelmed by her loss.  Goals are to see a 50% reduction in grief symptoms over the next 6 months.  I will encourage the use of the patient telling his grief story about his daughter including encouraging him to bring pictures and memorabilia to help process his feelings including any feelings of guil t associated with her loss.  We will use cognitive behavioral therapy to help identify and change anxiety producing thoughts and behavior patterns so as to improve his ability to better manage anxiety and stress, and manage thoughts and worrisome thinking contributing to anxiety.  We will also refresh and encourage dialectical behavior therapy distress tolerance and mindfulness skills with the intention of reducing anxiety by 50% over t he next 6 months. Progress: 30% Interventions: Cognitive Behavioral Therapy  Diagnosis:PTSD, Bi-Polar D/O  Plan: F/U/ appointments scheduled weekly.  Cecile Coder, [facility]                                                                                                                   Cecile Coder, [facility]  [facility] Counselor/Therapist Progress Note  Patient ID: Glynn Yepes, MRN: [id number],    Date: 5 /30/2025 This session was held via video teletherapy. The patient consented to the video teletherapy and was located in his home during this session. He is aware it is the responsibility of the patient to secure confidentiality on his end of the session. The provider was in a private home office for the duration of this session.   The patient arrived on time for her Caregility session  Time Spent: 58 minutes, 2 PM to 2:58 PM  Tre atment Type: Individual Therapy Reported Symptoms: anxiety, panic attacks A fairly difficult week because it was his daughter's birthday.  He said this year was more difficult than  last year but he could not pinpoint why.  They did remember her about going out to a nice restaurant which is something her daughter always wanted to do.  He said they were able to laugh and joke about funny things that his daughter had said or done which i s not something they were able to do this time last year so he saw that as healthy grieving.  He knows that the time change threw things off a little bit for everybody and his wife still is not feeling well and his back has been bothering him.  He was not able to go to his first round of physical therapy this week because his back was bothering him more.  He has been thinking a lot more about his daughter which he feels comfortable with .  He is thankful that it is getting warmer and staying light longer so he can get back to some of the coping skills such as fishing that he has done in the past.   We will continue to work on coping skills for helping with his anxiety and the progression of his Lewy body dementia diagnosis as well as processing his grief. Mental Status Exam: Appearance:  Well Groomed  Behavior: Appropriate Motor: Pt. Reports some instability d ue to Lewey Body dementia Speech/Language:  Normal Rate Affect: Appropriate Mood: normal Thought process: normal Thought content:  WNL Sensory/Perceptual disturbances:  WNL Orientation: oriented to person, place, and situation Attention: Good Concentration: Good Memory: Impaired short term Fund of knowledge:  Good Insight:  Good Judgment:  Good Impulse Control: Good  Risk Assessment: Danger to Self: No Self-injurio us  Behavior: No Danger to Others: No Duty to Warn:no Physical Aggression / Violence:No  Access to Firearms a concern: No  Gang Involvement:No  Treatment plan: Will employ cognitive behavioral therapy as well as grief and loss therapy for relief of anxiety and grief.  Goals of grief therapy or to have a healthier response to the loss of his daughter and less  sadness as evidenced by patient report in therapy notes as well as to help  him feel less overwhelmed by her loss.  Goals are to see a 50% reduction in grief symptoms over the next 6 months.  I will encourage the use of the patient telling his grief story about his daughter including encouraging him to bring pictures and memorabilia to help process his feelings including any feelings of guilt associated with her loss.  We will use cognitive behavioral therapy to help identify and change anxiety producing thoug hts and behavior patterns so as to improve his ability to better manage anxiety and stress, and manage thoughts and worrisome thinking contributing to anxiety.  We will also refresh and encourage dialectical behavior therapy  distress tolerance and mindfulness skills with the intention of reducing anxiety by 50% over the next 6 months. Progress: 30% Interventions: Cognitive Behavioral Therapy  Diagnosis:PTSD, Bi-Polar D/O  Plan: F/ U/ appointments scheduled weekly.  Cecile Coder, [facility]                                                                                                                   Cecile Coder, [facility]               Cecile Coder, [facility]               Cecile Coder, [facility]  [place] [facility] Counselor/Therapist Progress Note  Patient ID: Pattrick Bady, MRN: [id number],    Date: 04/01/2024 This session was held via video  teletherapy. The patient consented to the video teletherapy and was located in his home during this session. He is aware it is the responsibility of the patient to secure confidentiality on his end of the session. The provider was in a private home office for the duration of this session.   The patient arrived on time for her Caregility session  Time Spent: 1:05 PM until 2 PM, 55 minutes Treatment Type: Individual Therapy Report ed Symptoms: anxiety, panic attacks The patient said he came in from his walk this morning and saw what he thought was some sort of tic from his wife.  It was not the first time it happened and he wonders if it might be tardive dyskinesia because of some new medications that she is on.  She has a call into the doctor right now and hopes to hear something back soon.  They have narrowed down to the fact they think most of her discomfort  in breathing is coming from the diaphragm and they are looking for alternatives for treatment.  The patient continues to do those things which help him cope including walking, fishing  when he can, spending time with his son and daughter-in-law and grandson.  He also has a way of continued coping with grief he is writing letters to his daughters as a way of expressing his feelings and says that is very beneficial.  He appears to be gri eving in a healthy way but he knows that he to year anniversary of her death is coming up and there is some grief there but says it is not overwhelming.  We will continue to work on coping skills for helping with his anxiety and the progression of his Lewy body dementia diagnosis as well as processing his grief. Mental Status Exam: Appearance:  Well Groomed  Behavior: Appropriate Motor: Pt. Reports some instability due to Lewey  Body dementia Speech/Language:  Normal Rate Affect: Appropriate Mood: normal Thought process: normal Thought content:  WNL Sensory/Perceptual disturbances:  WNL Orientation: oriented to person, place, and situation Attention: Good Concentration: Good Memory: Impaired short term Fund of knowledge:  Good Insight:  Good Judgment:  Good Impulse Control: Good  Risk Assessment: Danger to Self: No Self-injurious Behavior:  No Danger to Others: No Duty to Warn:no Physical Aggression / Violence:No  Access to Firearms a concern: No  Gang Involvement:No  Treatment plan: Will employ cognitive behavioral therapy as well as grief and loss therapy for relief of anxiety and grief.  Goals of grief therapy or to have a healthier response to the loss of his daughter and less sadness as evidenced by patient report in therapy notes as well as to help him feel le ss overwhelmed by her loss.  Goals are to see a 50% reduction in grief symptoms over the next 6 months.  I will encourage the use of the patient telling his grief story about his daughter including encouraging him to bring pictures and memorabilia to help process his feelings including any feelings of guilt associated with her loss.  We will use cognitive behavioral  therapy to help identify and change anxiety producing thoughts and beha vior patterns so as to improve his ability to better manage anxiety and stress, and manage thoughts and worrisome thinking contributing to anxiety.  We will also refresh and encourage dialectical behavior therapy distress tolerance  and mindfulness skills with the intention of reducing anxiety by 50% over the next 6 months. Progress: 30% Interventions: Cognitive Behavioral Therapy  Diagnosis:PTSD, Bi-Polar D/O  Plan: F/U/ appointme nts scheduled weekly.  Cecile Coder, [facility]                                                                                                                   Cecile Coder, [facility]  [place] Counselor/Therapist Progress Note  Patient ID: Kastin Cerda, MRN: [id number],    Date: 04/01/2024 This session was held via video teletherapy. The patient consented to the video teletherapy and was located in his home dur ing this session. He is aware it is the responsibility of the patient to secure confidentiality on his end of the session. The provider was in a private home office for the duration of this session.   The patient arrived on time for her Caregility session  Time Spent: 59 minutes, 2 PM to 2:59 PM  Treatment Type: Individual Therapy Reported Symptoms: anxiety, panic attacks The patient continues to present with physical pain and b oth his back and his knee.  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  He has continued to walk because he knows that is good for him in various ways but says it has been painful.  His wife also was not feeling well but she is going back to the do ctor next week to have an ablation and hopes that will bring her some relief.  For the most part he has been at the last couple weeks reaching back out and hearing from some old friends as well as time with his son and son's family.  He says that his wife's cousin is coming down for a weekend soon and they enjoy time with her and that he will go to his brother's house sometime in the next few weeks.  He recognizes the need for people th at he cares about in helping with his depression. We will continue to work on coping skills for helping with his anxiety and the progression of his Lewy body dementia diagnosis as well  as processing his grief. 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 Speech/Language:  Normal Rate Affect: Appropriate Mood: normal Thought process: normal  Thought content:  WNL Sensory/Perceptual disturbances:  WNL Orientation: oriented to person, place, and situation Attention: Good Concentration: Good Memory: Impaired short term Fund of knowledge:  Good Insight:  Good Judgment:  Good Impulse Control: Good  Risk Assessment: Danger to Self: No Self-injurious Behavior: No Danger to Others: No Duty to Warn:no Physical Aggression / Violence:No  Access to Firearms a conc ern: No  Gang Involvement:No  Treatment plan: Will employ cognitive behavioral therapy as well as grief and loss therapy for relief of anxiety and grief.  Goals of grief therapy or to have a healthier response to the loss of his daughter and less sadness as evidenced by patient report in therapy notes as well as to help him feel less overwhelmed by her loss.  Goals are to see a 50% reduction in grief symptoms over the next 6 months.   I will encourage the use of the patient telling his grief story about his daughter including encouraging him to bring pictures and memorabilia to help process his feelings including any feelings of guilt associated with her loss.  We will use cognitive behavioral therapy to help identify and change anxiety producing thoughts and behavior patterns so as to improve his ability to better manage anxiety and stress, and manage thoughts and w orrisome thinking contributing to anxiety.  We will also  refresh and encourage dialectical behavior therapy distress tolerance and mindfulness skills with the intention of reducing anxiety by 50% over the next 6 months. Progress: 30% Interventions: Cognitive Behavioral Therapy  Diagnosis:PTSD, Bi-Polar D/O  Plan: F/U/ appointments scheduled weekly.  Cecile Coder, [facility]                                                                                                                    Cecile Coder, [facility]               Cecile Coder, [facility]               Cecile Coder, [facility]               Cecile Coder, [facility]  [place] [facility] Counselor/Therapist Progress Note  Patient ID: Mandel Seiden, MRN: [id number],    Date: 04/01/2024 This session was held via video teletherapy. The patient consented to the video telet herapy and was located in his home during this session. He is aware it is the responsibility of the patient to  secure confidentiality on his end of the session. The provider was in a private home office for the duration of this session.   The patient arrived on time for her Caregility session  Time Spent: 57 minutes, 2 PM to 2:57 PM  Treatment Type: Individual Therapy Reported Symptoms: anxiety, panic attacks The patient is stil l having significant back pain.  He cannot sit anywhere for very long and has to be careful where he sits.  There is still some unsteadiness although he did not report any falls over the past 2 weeks.  His biggest concern now is for his wife.  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  She is meeting with her cardiologist again next week to see what possib le treatment is for her.  He still thinks there may be some issue with her diaphragm but she is meeting with the pulmonologist also.  He is doing what he can to help her.  His daughter's birthday is March 11 so he knows that is a difficult time for both he and his wife next week.  Typically they do something to remember her but because of the wife's current health issues they cannot go to [place] as they had planned so they are  talking about what to do to remember and celebrate her birthday.  We will continue to work on coping skills for helping with his anxiety and the progression of his Lewy body dementia diagnosis as well as processing his grief. 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 Speech/Language:  Normal Rate Affect: Appropriate Mood: normal Thought  process: normal Thought content:  WNL Sensory/Perceptual disturbances:  WNL Orientation: oriented to person, place, and situation Attention: Good Concentration: Good Memory: Impaired short term Fund of knowledge:  Good Insight:  Good Judgment:  Good Impulse Control: Good  Risk  Assessment: Danger to Self: No Self-injurious Behavior: No Danger to Others: No Duty to Warn:no Physical Aggression / Violence:No  Access t o Firearms a concern: No  Gang Involvement:No  Treatment plan: Will employ cognitive behavioral therapy as well as grief and loss therapy for relief of anxiety and grief.  Goals of grief therapy or to have a healthier response to the loss of his daughter and less sadness as evidenced by patient report in therapy notes as well as to help him feel less overwhelmed by her loss.  Goals are to see a 50% reduction in grief symptoms over the  next 6 months.  I will encourage the use of the patient telling his grief story about his daughter including encouraging him to bring pictures and memorabilia to help process his feelings including any feelings of guilt associated with her loss.  We will use cognitive behavioral therapy to help identify and change anxiety producing thoughts and behavior patterns so as to improve his ability to better manage anxiety and stress, and mana ge thoughts and worrisome thinking contributing to anxiety.  We will also refresh and encourage dialectical behavior therapy distress tolerance and mindfulness  skills with the intention of reducing anxiety by 50% over the next 6 months. Progress: 30% Interventions: Cognitive Behavioral Therapy  Diagnosis:PTSD, Bi-Polar D/O  Plan: F/U/ appointments scheduled weekly.  Cecile Coder, [facility]                                                                                                                    Cecile Coder, [facility]  [place] [facility] Counselor/Therapist Progress Note  Patient ID: Josias Tomerlin, MRN: [id number],    Date: 04/01/2024 This session was held via video teletherapy. The patient consented to the video teletherapy and was located in his home during this session. He is aware it is the responsibility of the patient to secure confiden tiality on his end of the session. The provider was in a private home office for the duration of this session.   The patient arrived on time for her Caregility session  Time Spent: 58 minutes, 2 PM to 2:58 PM  Treatment Type: Individual Therapy Reported Symptoms: anxiety, panic attacks A fairly difficult week because it was his daughter's birthday.  He said this year was more difficult than last year but he could not pinpoint wh y.  They did remember her about going out to a nice restaurant which is something her daughter always wanted to do.  He said they were able to laugh and joke about funny things that his  daughter had said or done which is not something they were able to do this time last year so he saw that as healthy grieving.  He knows that the time change threw things off a little bit for everybody and his wife still is not feeling well and his back h as been bothering him.  He was not able to go to his first round of physical therapy this week because his back was bothering him more.  He has been thinking a lot more about his daughter which he feels comfortable with.  He is thankful that it is getting warmer and staying light longer so he can get back to some of the coping skills such as fishing that he has done in the past.   We will continue to work on coping skills for helping  with his anxiety and the progression of his Lewy body dementia diagnosis as well as processing his grief. 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 Speech/Language:  Normal Rate Affect: Appropriate Mood: normal Thought process: normal Thought content:  WNL Sensory/Perceptual disturbances:  WNL Orientation: oriented to person, place,  and situation Attention: Good Concentration: Good Memory: Impaired short term Fund of knowledge:  Good Insight:  Good Judgment:  Good Impulse Control: Good  Risk Assessment: Danger to Self: No Self-injurious Behavior: No Danger to Others: No Duty to Warn:no Physical Aggression / Violence:No  Access to Firearms a concern: No  Gang Involvement:No  Treatment plan: Will employ cognitive behavioral therapy as well as grie f and loss therapy for relief of anxiety and grief.  Goals of grief therapy or to have a healthier response to the loss of his daughter and less sadness as evidenced by patient report in therapy notes as well as to help him feel less overwhelmed by her loss.  Goals are to see a 50% reduction in grief symptoms over the next 6 months.  I will encourage the use of the  patient telling his grief story about his daughter including encouraging  him to bring pictures and memorabilia to help process his feelings including any feelings of guilt associated with her loss.  We will use cognitive behavioral therapy to help identify and change anxiety producing thoughts and behavior patterns so as to improve his ability to better manage anxiety and stress, and manage thoughts and worrisome thinking contributing to anxiety.  We will also refresh and encourage dialectical behavior ther apy  distress tolerance and mindfulness skills with the intention of reducing anxiety by 50% over the next 6 months. Progress: 30% Interventions: Cognitive Behavioral Therapy  Diagnosis:PTSD, Bi-Polar D/O  Plan: F/U/ appointments scheduled weekly.  Cecile Coder, [facility]                                                                                                                    Cecile Coder, [facility]               Cecile Coder, [facility]               Cecile Coder, [facility]  [facility] Counselor/Therapist Progress Note  Patient ID: Nahom Carfagno, MRN: [id number],    Date: 04/01/2024 This session was held via video teletherapy. The patient consented to the video teletherapy and was located in his home during this session. He is aware it is the responsibility of the patient to secure confidentiality on his end of the ses sion. The provider was in a private home office for the duration of this session.   The patient arrived on time for her Caregility session  Time Spent: 57 minutes, 2 PM to 2:57 PM  Treatment Type: Individual Therapy Reported Symptoms: anxiety, panic attacks The patient is still having significant back pain.  He cannot sit anywhere for very long and has to be careful where he sits.  There is still some unsteadiness although he di d not report any falls over the past 2 weeks.  His biggest concern now is for his wife.  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  She is meeting with her cardiologist again next week to see what possible treatment is for her.  He still thinks there may be some issue with her diaphragm but she is meeting with the pulmonologist also.  He is doing what he c an to help her.  His daughter's birthday is March 11 so he knows that is a difficult time for both he and his wife next week.  Typically they do something to remember her but because  of the wife's current health issues they cannot go to [place] as they had planned so they are talking about what to do to remember and celebrate her birthday.  We will continue to work on coping skills for helping with his anxiety and the progress ion of his Lewy body dementia diagnosis as well as processing his grief. 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 Speech/Language:  Normal Rate Affect: Appropriate Mood: normal Thought process: normal Thought content:  WNL Sensory/Perceptual disturbances:  WNL Orientation: oriented to person, place, and situation Attention: Good C oncentration: Good Memory: Impaired short term Fund of knowledge:  Good Insight:  Good Judgment:  Good Impulse Control: Good  Risk Assessment: Danger to Self: No Self-injurious Behavior: No Danger to Others: No Duty to Warn:no Physical Aggression / Violence:No  Access to Firearms a concern: No  Gang Involvement:No  Treatment plan: Will employ cognitive behavioral therapy as well as grief and loss therapy for relief of a nxiety and grief.  Goals of grief therapy or to have a healthier response to the loss of his daughter and less sadness as evidenced by patient report in therapy notes as well as to help him feel less overwhelmed by her loss.  Goals are to see a 50% reduction in grief symptoms over the next 6 months.  I will encourage the use of the patient telling his grief story about his daughter including encouraging him to bring pictures and memorab ilia to help process his feelings including any feelings of guilt associated with her loss.  We will use cognitive behavioral therapy to help identify and change anxiety producing thoughts and behavior patterns so as to improve his ability to better manage anxiety and stress, and manage thoughts and worrisome thinking contributing to anxiety.  We will also refresh and  encourage dialectical behavior therapy distress tolerance and mindful ness  skills with the intention of reducing anxiety by 50% over the next 6 months. Progress: 30% Interventions: Cognitive Behavioral Therapy  Diagnosis:PTSD, Bi-Polar D/O  Plan: F/U/ appointments scheduled weekly.  Cecile Coder, [facility]                                                                                                                    Cecile Coder, [facility]  [place] [facility] Counselor/Therapist Progress Not e  Patient ID: Sherrill Mckamie, MRN: [id number],    Date: 04/01/2024 This session was held via video  teletherapy. The patient consented to the video teletherapy and was located in his home during this session. He is aware it is the responsibility of the patient to secure confidentiality on his end of the session. The provider was in a private home office for the duration of this session.   The patient arrived on time for her Car egility session  Time Spent: 57 minutes, 2 PM to 2:57 PM  Treatment Type: Individual Therapy Reported Symptoms: anxiety, panic attacks  We will continue to work on coping skills for helping with his anxiety and the progression of his Lewy body dementia diagnosis as well as processing his grief. 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  Speech/Language:  Normal Rate Affect: Appropriate Mood: normal Thought process: normal Thought content:  WNL Sensory/Perceptual disturbances:  WNL Orientation: oriented to person, place, and situation Attention: Good Concentration: Good Memory: Impaired short term Fund of knowledge:  Good Insight:  Good Judgment:  Good Impulse Control: Good  Risk Assessment: Danger to Self: No Self-injurious Behavior: No Danger to  Others: No Duty to Warn:no Physical Aggression / Violence:No  Access to Firearms a concern: No  Gang Involvement:No  Treatment plan: Will employ cognitive behavioral therapy as well as grief and loss therapy for relief of anxiety and grief.  Goals of grief therapy or to have a healthier response to the loss of his daughter and less sadness as evidenced by patient report in therapy notes as well as to help him feel less overwhelmed  by her loss.  Goals are to see a 50% reduction in grief symptoms over the next 6 months.  I will encourage the use of the patient telling his grief story about his daughter including encouraging him to bring pictures and memorabilia to help process his feelings including any feelings of  guilt associated with her loss.  We will use cognitive behavioral therapy to help identify and change anxiety producing thoughts and behavior patterns  so as to improve his ability to better manage anxiety and stress, and manage thoughts and worrisome thinking contributing to anxiety.  We will also refresh and encourage dialectical behavior therapy distress tolerance and mindfulness skills with the intention of reducing anxiety by 50% over the next 6 months. Progress: 30% Interventions: Cognitive Behavioral Therapy  Diagnosis:PTSD, Bi-Polar D/O  Plan: F/U/ appointments scheduled  weekly.  Cecile Coder, [facility]                                                                                                                   Cecile Coder, [facility]               Cecile Coder, [facility]               Cecile Coder, [facility]  Cecile Coder, [facility]                Cecile Coder, [name]               Cecile Coder, [facility]               Cecile Coder, [facility]  [facility] Counselor/Therapist Progress Note  Patient ID: Raymundo Rout, MRN: [id number],    Date: 04/01/2024 This session was held via video teletherapy. The patient consented to the video teletherapy and was located in his home during this session. He is aware it is the responsibility of the patient to secure confidentiality on his end of the session. The provider was in a private home  office for the duration of this session.   The patient arrived on time for her Caregility session  Time Spent: 39 minutes, 2 PM to 2:39 PM  Treatment Type: Individual Therapy Reported Symptoms: anxiety, panic attacks The patient was not feeling well today.  He started feeling really earlier in the week having a fever bad headaches body aches.  He felt a little better yesterday but feels a little bit of a resurgence in headaches  and body aches and is running a low-grade fever.  His wife is feeling some of the same symptoms.  For the most part he says he is doing well.  They did increase his memory medication because they were starting to see some increasing decline in short-term memory and he said until recently his long-term memory had been good but he was starting to see a difference in how he used to markers to remember what happened at certain times histori cally.  He feels the medication has helped and has brightened his mood a little also.  He is still trying to walk every day and fish when he can and has enjoyed watching the [employer] as a good distraction.  He reports that he feels he is managing mood fairly well.  He has an appointment in 2 weeks.  He does contract for safety having no thoughts of hurting himself or anyone else. We will continue to work on coping skil ls for helping with his anxiety and the progression of his Lewy body dementia diagnosis as well as processing his grief. 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 Speech/Language:  Normal Rate Affect: Appropriate Mood: normal Thought process: normal Thought content:  WNL Sensory/Perceptual disturbances:  WNL Orientation: oriented to  person, place, and situation Attention: Good Concentration: Good Memory: Impaired short term Fund of knowledge:  Good Insight:  Good Judgment:  Good Impulse Control: Good  Risk Assessment: Danger to Self: No Self-injurious Behavior: No Danger to Others: No Duty to Warn:no Physical Aggression / Violence:No  Access to Firearms a concern: No  Gang Involvement:No  Treatment plan: Will employ cognitive behavioral therapy a s well as grief and loss therapy for relief of anxiety and grief.  Goals of grief therapy or to have a healthier response to the loss of his daughter and less sadness as evidenced by patient report in therapy notes as well as to help him feel less overwhelmed by her loss.  Goals are to see a 50% reduction in grief symptoms over the next 6 months.  I will encourage the use of the patient telling his grief story about his daughter includi ng encouraging him to bring pictures and memorabilia to help process his feelings including any feelings of guilt associated with her loss.  We will use cognitive behavioral therapy to help identify and change anxiety producing thoughts and behavior patterns so as to improve his ability to better manage anxiety and stress, and manage thoughts and worrisome thinking  contributing to anxiety.  We will also refresh and  encourage dialectical  behavior therapy distress tolerance and mindfulness skills with the intention of reducing anxiety by 50% over the next 6 months. Progress: 30% Interventions: Cognitive Behavioral Therapy  Diagnosis:PTSD, Bi-Polar D/O  Plan: F/U/ appointments scheduled weekly.  Cecile Coder, [facility]                                                                                                                    Cecile Coder, [facility]  [place] [facility] Counselor/Therapist Progress Note  Patient ID: Noal Abshier, MRN: [id number],    Date:  04/01/2024 This session was held via video teletherapy. The patient consented to the video teletherapy and was located in his home during this session. He is aware it is the responsibility of the patient to secure confidentiality on his end of the session. The provider was in a private home office for the duration of this sessi on.   The patient arrived on time for her Caregility session  Time Spent: 58 minutes, 2 PM to 2:58 PM  Treatment Type: Individual Therapy Reported Symptoms: anxiety, panic attacks A fairly difficult week because it was his daughter's birthday.  He said this year was more difficult than last year but he could not pinpoint why.  They did remember her about going out to a nice restaurant which is something her daughter always wante d to do.  He said they were able to laugh and joke about funny things that his daughter had said or done which is not something they were able to do this time last year so he saw that as healthy grieving.  He knows that the time change threw things off a little bit for everybody and his wife still is not feeling well and his back has been bothering him.  He was not able to go to his first round of physical therapy this week because his  back was bothering him more.  He has been thinking a lot more about his daughter which he feels comfortable with.  He is thankful that it is getting warmer and staying light longer so he can get back to some of the coping skills such as fishing that he has done in the past.   We will continue to work on coping skills for helping with his anxiety and the progression of his Lewy body dementia diagnosis as well as processing his grief.  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 Speech/Language:  Normal Rate Affect: Appropriate Mood: normal Thought process: normal Thought content:  WNL Sensory/Perceptual disturbances:   WNL Orientation: oriented to person, place, and situation Attention: Good Concentration: Good Memory: Impaired short term Fund of knowledge:  Good  Insight:  Good Judgment:  Good Impulse Control: Good  Risk Assessment: Danger to Self: No Self-injurious Behavior: No Danger to Others: No Duty to Warn:no Physical Aggression / Violence:No  Access to Firearms a concern: No  Gang Involvement:No  Treatment plan: Will employ cognitive behavioral therapy as well as grief and loss therapy for relief of anxiety and grief.  Goals of grief therapy or to have a healthier response  to the loss of his daughter and less sadness as evidenced by patient report in therapy notes as well as to help him feel less overwhelmed by her loss.  Goals are to see a 50% reduction in grief symptoms over the next 6 months.  I will encourage the use of the patient telling his grief story about his daughter including encouraging him to bring pictures and memorabilia to help process his feelings including any feelings of guilt associa ted with her loss.  We will use cognitive behavioral therapy to help identify and change anxiety producing thoughts and behavior patterns so as to improve his ability to better manage anxiety and stress, and manage thoughts and worrisome thinking contributing to anxiety.  We will also refresh and encourage dialectical behavior therapy  distress tolerance and mindfulness skills with the intention of reducing anxiety by 50% over the next 6  months. Progress: 30% Interventions: Cognitive Behavioral Therapy  Diagnosis:PTSD, Bi-Polar D/O  Plan: F/U/ appointments scheduled weekly.  Cecile Coder, [facility]                                                                                                                   Cecile Coder, [facility]                Cecile Coder,  [facility]               Cecile Coder, [facility]  [place] [facility] Counselor/Therapist Progr ess Note  Patient ID: Musab Wingard, MRN: [id number],    Date: 04/01/2024 This session was held via video teletherapy. The patient consented to the video teletherapy and was located in his home during this session. He is aware it is the responsibility of the patient to secure confidentiality on his end of the session. The provider was in a private home office for the duration of this session.   The patient arrived on time for  her Caregility session  Time Spent: 1:05 PM until 2 PM, 55 minutes Treatment Type: Individual Therapy Reported Symptoms: anxiety, panic attacks The patient said he came in from his walk this morning and saw  what he thought was some sort of tic from his wife.  It was not the first time it happened and he wonders if it might be tardive dyskinesia because of some new medications that she is on.  She has a call into the doctor right no w and hopes to hear something back soon.  They have narrowed down to the fact they think most of her discomfort in breathing is coming from the diaphragm and they are looking for alternatives for treatment.  The patient continues to do those things which help him cope including walking, fishing when he can, spending time with his son and daughter-in-law and grandson.  He also has a way of continued coping with grief he is writing lett ers to his daughters as a way of expressing his feelings and says that is very beneficial.  He appears to be grieving in a healthy way but he knows that he to year anniversary of her death is coming up and there is some grief there but says it is not overwhelming.  We will continue to work on coping skills for helping with his anxiety and the progression of his Lewy body dementia diagnosis as well as processing his grief. Mental Stat us  Exam: Appearance:  Well Groomed  Behavior: Appropriate Motor: Pt. Reports some instability due to Lewey Body dementia Speech/Language:  Normal Rate Affect: Appropriate Mood: normal Thought process: normal Thought content:  WNL Sensory/Perceptual disturbances:  WNL Orientation: oriented to person, place, and situation Attention: Good Concentration: Good Memory: Impaired short term Fund of knowledge:  Good Insight:   Good Judgment:  Good Impulse Control: Good  Risk Assessment: Danger to Self: No Self-injurious Behavior: No Danger to Others: No Duty to Warn:no Physical Aggression / Violence:No  Access to Firearms a concern: No  Gang Involvement:No  Treatment plan: Will employ cognitive behavioral therapy as well as grief and loss therapy for relief of anxiety and grief.  Goals of grief therapy  or to have a healthier response to the loss  of his daughter and less sadness as evidenced by patient report in therapy notes as well as to help him feel less overwhelmed by her loss.  Goals are to see a 50% reduction in grief symptoms over the next 6 months.  I will encourage the use of the patient telling his grief story about his daughter including encouraging him to bring pictures and memorabilia to help process his feelings including any feelings of guilt associated with her  loss.  We will use cognitive behavioral therapy to help identify and change anxiety producing thoughts and behavior patterns so as to improve his ability to better manage anxiety and stress, and manage thoughts and worrisome thinking contributing to anxiety.  We will also refresh and encourage dialectical behavior therapy distress tolerance  and mindfulness skills with the intention of reducing anxiety by 50% over the next 6 months. Pr ogress: 30% Interventions: Cognitive Behavioral Therapy  Diagnosis:PTSD, Bi-Polar D/O  Plan: F/U/ appointments scheduled weekly.  Cecile Coder, [facility]                                                                                                                   Cecile Coder, [facility]   [facility] Counselor/Therapist Progress Note  Patient ID: Lytle Malburg, MRN: [id number],    Date: 04/01/2024 This sessio n was held via video teletherapy. The patient consented to the video teletherapy and was located in his home during this session. He is aware it is the responsibility of the patient to secure confidentiality on his end of the session. The provider was in a private home office for the duration of this session.   The patient arrived on time for her Caregility session  Time Spent: 59 minutes, 2 PM to 2:59 PM  Treatment Type: Individu al Therapy Reported Symptoms: anxiety, panic attacks The patient continues to present with physical pain and both his back and his knee.  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  He has continued to walk because he knows that is good for him i n various ways but says it has been painful.  His wife also was not feeling well but she is going back to the doctor next week to have an ablation and hopes that will bring her some  relief.  For the most part he has been at the last couple weeks reaching back out and hearing from some old friends as well as time with his son and son's family.  He says that his wife's cousin is coming down for a weekend soon and they enjoy time with her  and that he will go to his brother's house sometime in the next few weeks.  He recognizes the need for people that he cares about in helping with his depression. We will continue to work on coping skills for helping with his anxiety and the progression of his Lewy body dementia diagnosis as well as processing his grief. Mental Status Exam: Appearance:  Well Groomed  Behavior: Appropriate Motor: Pt. Reports some instability due to  Lewey Body dementia Speech/Language:  Normal Rate Affect: Appropriate Mood: normal Thought process: normal Thought content:  WNL Sensory/Perceptual disturbances:  WNL Orientation: oriented to person, place, and situation Attention: Good Concentration: Good Memory: Impaired short term Fund of knowledge:  Good Insight:  Good Judgment:  Good Impulse Control: Good  Risk Assessment: Danger to Self: No Self-injurious Be havior: No Danger to Others: No Duty to Warn:no Physical Aggression / Violence:No  Access to Firearms a concern: No  Gang Involvement:No  Treatment plan: Will employ cognitive behavioral therapy as well as grief and loss therapy for relief of anxiety and grief.  Goals of grief therapy or to have a healthier response to the loss of his daughter and less sadness as evidenced by patient report in therapy notes as well as to help him  feel less overwhelmed by her loss.  Goals are to see a 50% reduction in grief symptoms over the next 6 months.  I will encourage the use of the patient telling his grief story about his daughter including encouraging him to bring pictures and memorabilia to help process his feelings including any feelings of guilt associated with her loss.  We will use cognitive  behavioral therapy to help identify and change anxiety producing thoughts a nd behavior patterns so as to improve his ability to better manage anxiety and stress, and manage thoughts and worrisome thinking contributing to anxiety.  We will  also refresh and encourage dialectical behavior therapy distress tolerance and mindfulness skills with the intention of reducing anxiety by 50% over the next 6 months. Progress: 30% Interventions: Cognitive Behavioral Therapy  Diagnosis:PTSD, Bi-Polar D/O  Plan: F/U/ ap pointments scheduled weekly.  Cecile Coder, [facility]                                                                                                                   Cecile Coder, [facility]               Cecile Coder, [facility]               Cecile Coder, [facility]               Crai g Kelly Patient, [facility]  [place] [facility] Counselor/Therapist Progress Note  Patient ID: Welton Bord, MRN: [id number],    Date: 04/01/2024 This session was held via video teletherapy. The patient consented to the video teletherapy and was located in his home during this session. He is aware it is the responsibility of the patient to secure confidentiality on his end of the session. The provider was in a private home office for the duration of this session.   The patient arrived on time for her Caregility session  Time Spent: 57 minutes, 2 PM  to 2:57 PM  Treatment Type: Individual Therapy Reported Symptoms: anxiety, panic attacks The patient is still having significant back pain.  He cannot sit anywhere for very long and has to be careful where he sits.  There is still some unsteadiness although he did not report any falls over the past 2 weeks.  His biggest concern now is for his wife.  She has been having some difficulty breathing and they did a test so they found a sm all hole on the back side of her heart.  She is meeting with her cardiologist again next week to see what possible treatment is for her.  He still thinks there may be some issue with her diaphragm but she is meeting with the pulmonologist also.  He is doing what he can to help her.  His daughter's birthday is March 11 so he knows that is a difficult time for both he and his wife next week.  Typically they do something to remember her  but because of the wife's current health issues they cannot go to [place] as they had planned so they are talking about what to do to remember and celebrate her birthday.  We  will continue to work on coping skills for helping with his anxiety and the progression of his Lewy body dementia diagnosis as well as processing his grief. Mental Status Exam: Appearance:  Well Groomed  Behavior: Appropriate Motor: Pt. Reports some i nstability due to Lewey Body dementia Speech/Language:  Normal Rate Affect: Appropriate Mood: normal Thought process: normal Thought content:  WNL Sensory/Perceptual disturbances:  WNL Orientation: oriented to person, place, and situation Attention: Good Concentration: Good Memory: Impaired short term Fund of knowledge:  Good Insight:  Good Judgment:  Good Impulse Control: Good  Risk Assessment: Danger to Self: No  Self-injurious Behavior: No Danger to Others: No Duty to Warn:no Physical Aggression / Violence:No  Access to Firearms a concern: No  Gang Involvement:No  Treatment plan: Will employ cognitive behavioral therapy as well as grief and loss therapy for relief of anxiety and grief.  Goals of grief therapy or to have a healthier response to the loss of his daughter and less sadness as evidenced by patient report in therapy notes as wel l as to help him feel less overwhelmed by her loss.  Goals are to see a 50% reduction in grief symptoms over the next 6 months.  I will encourage the use of the patient telling his grief story about his daughter including encouraging him to bring pictures and memorabilia to help process his feelings including any feelings of guilt associated with her loss.  We will use cognitive behavioral therapy to help identify and change anxiety pro ducing thoughts and behavior patterns so as to improve his ability to better manage anxiety and stress, and manage thoughts and worrisome thinking contributing to anxiety.  We will also refresh and encourage dialectical behavior therapy distress tolerance and mindfulness skills  with the intention of reducing anxiety by 50% over the next 6 months. Progress:  30% Interventions: Cognitive Behavioral Therapy  Diagnosis:PTSD, Bi-Polar D/O   Plan: F/U/ appointments scheduled weekly.  Cecile Coder, [facility]                                                                                                                   Cecile Coder, [facility]  [place] [facility] Counselor/Therapist Progress Note  Patient ID: Talmadge Ganas, MRN: [id number],    Date: 04/01/2024 This session was held via video teletherapy. The patient consented to the video teletherapy and was  located in his home during this session. He is aware it is the responsibility of the patient to secure  confidentiality on his end of the session. The provider was in a private home office for the duration of this session.   The patient arrived on time for her Caregility session  Time Spent: 58 minutes, 2 PM to 2:58 PM  Treatment Type: Individual Therapy Reported Symptoms: anxiety, panic attacks A fairly difficult week because i t was his daughter's birthday.  He said this year was more difficult than last year but he could not pinpoint why.  They did remember her about going out to a nice restaurant which is something her daughter always wanted to do.  He said they were able to laugh and joke about funny things that his daughter had said or done which is not something they were able to do this time last year so he saw that as healthy grieving.  He knows that t he time change threw things off a little bit for everybody and his wife still is not feeling well and his back has been bothering him.  He was not able to go to his first round of physical therapy this week because his back was bothering him more.  He has been thinking a lot more about his daughter which he feels comfortable with.  He is thankful that it is getting warmer and staying light longer so he can get back to some of the coping  skills such as fishing that he has done in the past.   We will continue to work on coping skills for helping with his anxiety and the progression of his Lewy body dementia diagnosis as well as processing his grief. 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 Speech/Language:  Normal Rate Affect: Appropriate Mood: normal Thought process:  normal Thought content:  WNL Sensory/Perceptual disturbances:  WNL Orientation: oriented to person, place, and situation Attention: Good Concentration: Good Memory: Impaired short term Fund of knowledge:  Good Insight:  Good Judgment:  Good Impulse Control: Good  Risk  Assessment: Danger to Self: No Self-injurious Behavior: No Danger to Others: No Duty to Warn:no Physical Aggression / Violence:No  Access to Firearm s a concern: No  Gang Involvement:No  Treatment plan: Will employ cognitive behavioral therapy as well as grief and loss therapy for relief of anxiety and grief.  Goals of grief therapy or to have a healthier response to the loss of his daughter and less sadness as evidenced by patient report in therapy notes as well as to help him feel less overwhelmed by her loss.  Goals are to see a 50% reduction in grief symptoms over the next 6 m onths.  I will encourage the use of the patient telling his grief story about his daughter including encouraging him to bring pictures and memorabilia to help process his feelings including any feelings of guilt associated with her loss.  We will use cognitive behavioral therapy to help identify and change anxiety producing thoughts and behavior patterns so as to improve his ability to better manage anxiety and stress, and manage though ts and worrisome thinking contributing to anxiety.  We will also refresh and encourage dialectical behavior therapy  distress tolerance and mindfulness skills with the intention of reducing anxiety by 50% over the next 6 months. Progress: 30% Interventions: Cognitive Behavioral Therapy  Diagnosis:PTSD, Bi-Polar D/O  Plan: F/U/ appointments scheduled weekly.  Cecile Coder, [facility]                                                                                                                    Cecile Coder, [facility]               Cecile Coder, [facility]               Cecile Coder, [facility]  [place] [facility] Counselor/Therapist Progress Note  Patient ID: Barrie Wale, MRN: [id number],    Date: 04/01/2024 This session was held via video teletherapy. The patient consented to the video teletherapy and was located in his home during t his session. He is aware it is the responsibility of the patient to secure confidentiality on his end of the session. The provider was in a private home office for the duration of this session.   The patient arrived on time for her Caregility session  Time Spent: 57 minutes, 2 PM to 2:57 PM  Treatment Type: Individual Therapy Reported Symptoms: anxiety, panic attacks The patient is still having significant back pain.  He cannot  sit anywhere for very long and has to be careful where he sits.  There is still some unsteadiness although he did not report any falls over the past 2 weeks.  His biggest concern now is  for his wife.  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  She is meeting with her cardiologist again next week to see what possible treatment is for her.  He still thinks t here may be some issue with her diaphragm but she is meeting with the pulmonologist also.  He is doing what he can to help her.  His daughter's birthday is March 11 so he knows that is a difficult time for both he and his wife next week.  Typically they do something to remember her but because of the wife's current health issues they cannot go to [place] as they had planned so they are talking about what to do to remember and ce lebrate her birthday.  We will continue to work on coping skills for helping with his anxiety and the progression of his Lewy body dementia diagnosis as well as processing his grief. 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 Speech/Language:  Normal Rate Affect: Appropriate Mood: normal Thought process: normal Thought content:  WNL S ensory/Perceptual disturbances:  WNL Orientation: oriented to person, place, and situation Attention: Good Concentration: Good Memory: Impaired short term Fund of knowledge:  Good Insight:  Good Judgment:  Good Impulse Control: Good  Risk Assessment: Danger to Self: No Self-injurious Behavior: No Danger to Others: No Duty to Warn:no Physical Aggression / Violence:No  Access to Firearms a concern: No  Gang Involvement :No  Treatment plan: Will employ cognitive behavioral therapy as well as grief and loss therapy for relief of anxiety and grief.  Goals of grief therapy or to have a healthier response to the loss of his daughter and less sadness as evidenced by patient report in therapy notes as well as to help him feel less overwhelmed by her loss.  Goals are to see a 50% reduction  in grief symptoms over the next 6 months.  I will encourage the use o f the patient telling his grief story about his daughter including encouraging him to bring pictures and memorabilia to help process his feelings including any feelings of guilt associated with her loss.  We will use cognitive behavioral therapy to help identify and change anxiety producing thoughts and behavior patterns so as to improve his ability to better manage anxiety and stress, and manage thoughts and worrisome thinking contribu ting to anxiety.  We will also refresh and encourage dialectical behavior therapy distress tolerance and mindfulness  skills with the intention of reducing anxiety by 50% over the next 6 months. Progress: 30% Interventions: Cognitive Behavioral Therapy  Diagnosis:PTSD, Bi-Polar D/O  Plan: F/U/ appointments scheduled weekly.  Cecile Coder, [facility]                                                                                                                    Cecile Coder, [facility]  [place] [facility] Counselor/Therapist Progress Note  Patient ID: Deanta Mincey, MRN: [id number],    Date: 04/01/2024 This session was held via video teletherapy. The patient consented to the video teletherapy and was located in his home during this session. He is aware it is the responsibility of the patient to secure confidentiality on his end of the session. The prov ider was in a private home office for the duration of this session.   The patient arrived on time for her Caregility session  Time Spent: 57 minutes, 2 PM to 2:57 PM  Treatment Type: Individual Therapy Reported Symptoms: anxiety, panic attacks  The patient reported that he had not been sleeping well so he did speak with his psychiatrist about adjusting some meds.  Initially they adjusted too much up and he said he felt groggy f or the entire next day but they have found a combination now where he feels like he is getting more rested and only feels a little drowsy shortly after waking up..  There have been a couple times where he stumbled and one of the times he felt down but did not get hurt the other times he was able to catch himself.  He is trying to stay as active as he can walking daily and fishing with his wife when he can.  His biggest anxiety now is hi s wife has an upcoming surgery on her diaphragm in 2 weeks so he knows that will be a fairly significant recovery time but also sees the difficulties she is having now and wants her to  get some relief..  His son and daughter-in-law will be supports for them also.  He continues to speak with friends and family members as encouragement.  He also feels that he is grieving appropriately writing to his daughter on a regular basis about the t hings that are going on in his and his wife's lives.  We will continue to work on coping skills for helping with his anxiety and the progression of his Lewy body dementia diagnosis as well as processing his grief. 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 Speech/Language:  Normal Rate Affect: Appropriate Mood: normal Thought process: no rmal Thought content:  WNL Sensory/Perceptual disturbances:  WNL Orientation: oriented to person, place, and situation Attention: Good Concentration: Good Memory: Impaired short term Fund of knowledge:  Good Insight:  Good Judgment:  Good Impulse Control: Good  Risk Assessment: Danger to Self: No Self-injurious Behavior: No Danger to Others: No Duty to Warn:no Physical Aggression / Violence:No  Access to Firearms a  concern: No  Gang Involvement:No  Treatment plan: Will employ cognitive behavioral therapy as well as grief and loss therapy for relief of anxiety and grief.  Goals of grief therapy or to have a healthier response to the loss of his daughter and less sadness as evidenced by patient report in therapy notes as well as to help him feel less overwhelmed by her loss.  Goals are to see a 50% reduction in grief symptoms over the next 6 mont hs.  I will encourage the use of the patient telling his grief story about his daughter including encouraging him to bring pictures and memorabilia to help process his feelings including any feelings of guilt associated with her loss.  We will use cognitive behavioral therapy to help identify and change anxiety producing thoughts and behavior patterns so as to improve  his ability to  better manage anxiety and stress, and manage thoughts  and worrisome thinking contributing to anxiety.  We will also refresh and encourage dialectical behavior therapy distress tolerance and mindfulness skills with the intention of reducing anxiety by 50% over the next 6 months. Progress: 30% Interventions: Cognitive Behavioral Therapy  Diagnosis:PTSD, Bi-Polar D/O  Plan: F/U/ appointments scheduled weekly.  Cecile Coder, [facility]                                                                                                                    Cecile Coder, [facility]               Cecile Coder, [facility]               Cecile Coder, [facility]               Cecile Coder, [facility]               Cecile Coder, [facility]               [address] [place], [facility]               Cecile Coder, [facility]               Cecile Coder, [facility]  [place]  Behavioral Health Counselor/Therapist Progress Note  Patient ID: Kai Calico, MRN: [id number],    Date: 04/01/2024 This session was held via video teletherapy. The patient consented to the video teletherapy and was located in his home during this session. He is aware it is the responsibility of the patient to secure confidentiality on his end of the session. The provider was in a private home office for the duration of this ses sion.   The patient arrived on time for her Caregility session  Time Spent: 59 minutes, 2 PM to 2:59 PM  Treatment Type: Individual Therapy Reported Symptoms: anxiety, panic attacks The patient said he jokingly said to his wife that he had not had any pain issues in the cervical area and then a few days ago he started having pain in his neck and shoulders.  He now sits related to degenerative disk and has some consistent pain so mewhere in his spine with that but jokingly says it just depends on Wednesday which part of his body is decides to show up and hurt.  He is doing the best that he can.  He is still walking which he knows is good for him but does not have right now the strength to do as much in the morning as he has done in the past so he is denying that to 3 times a day for consistency.  He is still doing some positive things and that he is staying in t ouch with his brother and other family members.  He is reconnecting with friends from high school and he is which she is enjoying.  One of them has had a similar experience in that she  lost her son in the same way the patient lost his daughter.  He said they did not think and Easter in which they set a place for his daughter at the table and put her picture there.  He initially had reservations about doing that he and his wife had made  everyone feel as if she was really there with him.  He still feels that he is grieving in a healthy way.  He and his wife have always taken a trip around both of their birthdays which are 2 days apart.  That is now close to his daughter died so they have started taking some of her rashes with him and sprinkling them where they go as a way of remembrance.  They are deciding what that looks like this year in July.  He does contract for  safety having no thoughts of hurting himself or anyone else. We will continue to work on coping skills for helping with his anxiety and the progression of his Lewy body dementia diagnosis as well as processing his grief. 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 Speech/Language:  Normal Rate Affect: Appropriate Mood: normal Thought proce ss: normal Thought content:  WNL Sensory/Perceptual disturbances:  WNL Orientation: oriented to person, place, and situation Attention: Good Concentration: Good Memory: Impaired short term Fund of knowledge:  Good Insight:  Good Judgment:  Good Impulse Control: Good  Risk Assessment: Danger to Self: No Self-injurious Behavior: No Danger to Others: No Duty to Warn:no Physical Aggression / Violence:No  Access to Fire arms a concern: No  Gang Involvement:No  Treatment plan: Will employ cognitive behavioral therapy as well as grief and loss therapy for relief of anxiety and grief.  Goals of grief therapy  or to have a healthier response to the loss of his daughter and less sadness as evidenced by patient report in therapy notes as well as to help him feel less overwhelmed by her  loss.  Goals are to see a 50% reduction in grief symptoms over the next  6 months.  I will encourage the use of the patient telling his grief story about his daughter including encouraging him to bring pictures and memorabilia to help process his feelings including any feelings of guilt associated with her loss.  We will use cognitive behavioral therapy to help identify and change anxiety producing thoughts and behavior patterns so as to improve his ability to better manage anxiety and stress, and manage tho ughts and worrisome thinking contributing to anxiety.  We will also refresh and encourage dialectical behavior therapy distress tolerance and mindfulness skills with the intention of reducing anxiety by 50% over the next 6 months. Progress: 30% Interventions: Cognitive Behavioral Therapy  Diagnosis:PTSD, Bi-Polar D/O  Plan: F/U/ appointments scheduled weekly.  Cecile Coder, [facility]                                                                                                                    Cecile Coder, [facility]  [place] [facility] Counselor/Therapist Progress Note  Patient ID: Jourdin Connors, MRN: [id number],    Date: 04/01/2024 This session was held via video teletherapy. The patient consented to the video teletherapy and was located in his home during this session. He is aware it is the responsibility of the patient to secure confidentialit y on his end of the session. The provider was in a private home office for the duration of this session.   The patient arrived on time for her Caregility session  Time Spent: 58 minutes, 2 PM to 2:58 PM  Treatment Type: Individual Therapy Reported Symptoms: anxiety, panic attacks A fairly difficult week because it was his daughter's birthday.  He said this year was more difficult than last year but he could not pinpoint why.  Th ey did remember her about going out to a nice restaurant which is something her daughter always wanted to do.  He said they were able to laugh and joke about funny things that his daughter had said or done which is not something they were able to do this time last year so he saw that as healthy grieving.  He knows that the time change threw things off a little bit for everybody and his wife still is not feeling well and his back has bee n bothering him.  He was not able to go to his first round of physical therapy this week because his back was bothering him more.  He has been thinking a lot more about his daughter  which he feels comfortable with.  He is thankful that it is getting warmer and staying light longer so he can get back to some of the coping skills such as fishing that he has done in the past.   We will continue to work on coping skills for helping with  his anxiety and the progression of his Lewy body dementia diagnosis as well as processing his grief. 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 Speech/Language:  Normal Rate Affect: Appropriate Mood: normal Thought process: normal Thought content:  WNL Sensory/Perceptual disturbances:  WNL Orientation: oriented to person, place, and s ituation Attention: Good Concentration: Good Memory: Impaired short term Fund of knowledge:  Good Insight:  Good Judgment:  Good Impulse Control: Good  Risk Assessment: Danger to Self: No Self-injurious Behavior: No Danger to Others: No Duty to Warn:no Physical Aggression / Violence:No  Access to Firearms a concern: No  Gang Involvement:No  Treatment plan: Will employ cognitive behavioral therapy as well as grief and  loss therapy for relief of anxiety and grief.  Goals of grief therapy or to have a healthier response to the loss of his daughter and less sadness as evidenced by patient report in therapy notes as well as to help him feel less overwhelmed by her loss.  Goals are to see a 50% reduction in grief symptoms over the next 6 months.  I will encourage the use of the patient telling his grief story about his daughter including encouraging him t o bring pictures and memorabilia to help process his feelings including any feelings of guilt associated with her loss.  We will use cognitive behavioral therapy to help identify and change anxiety producing thoughts and behavior patterns so as to improve his ability to better manage anxiety and stress, and manage thoughts and worrisome thinking contributing to  anxiety.  We will also refresh and encourage dialectical behavior therapy di  stress tolerance and mindfulness skills with the intention of reducing anxiety by 50% over the next 6 months. Progress: 30% Interventions: Cognitive Behavioral Therapy  Diagnosis:PTSD, Bi-Polar D/O  Plan: F/U/ appointments scheduled weekly.  Cecile Coder, [facility]                                                                                                                    Cecile Coder, [facility]               Cecile Coder, [facility]               Cecile Coder, [facility]  [place] [facility] Counselor/Therapist Progress  Note  Patient ID: Granger Chui, MRN: [id number],    Date: 04/01/2024 This session was held via video teletherapy. The patient consented to the video teletherapy and was located in his home during this session. He is aware it is the responsibility of the patient to secure confidentiality on his end of the session. The provider  was in a private home office for the duration of this session.   The patient arrived on time for her Caregility session  Time Spent: 1:05 PM until 2 PM, 55 minutes Treatment Type: Individual Therapy Reported Symptoms: anxiety, panic attacks The patient said he came in from his walk this morning and saw what he thought was some sort of tic from his wife.  It was not the first time it happened and he wonders if it might be tardiv e dyskinesia because of some new medications that she is on.  She has a call into the doctor right now and hopes to hear something back soon.  They have narrowed down to the fact they think most of her discomfort in breathing is coming from the diaphragm and they are looking for alternatives for treatment.  The patient continues to do those things which help him cope including walking, fishing when he can, spending time with his son a nd daughter-in-law and grandson.  He also has a way of continued coping with grief he is writing letters to his daughters as a way of expressing his feelings and says that is very beneficial.  He appears to be grieving in a healthy way but he knows that he to year anniversary of her death is coming up and there is some grief there but says it is not overwhelming.  We will continue to work on coping skills for helping with his anxiety  and the progression of his Lewy body dementia diagnosis as well as processing his grief. 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 Speech/Language:  Normal  Rate Affect: Appropriate Mood: normal Thought process: normal Thought content:  WNL Sensory/Perceptual disturbances:  WNL Orientation: oriented to person, place, and situation At tention: Good Concentration: Good Memory: Impaired short term Fund of knowledge:  Good Insight:  Good Judgment:  Good Impulse Control: Good  Risk Assessment: Danger to Self: No Self-injurious Behavior: No Danger to Others: No Duty to Warn:no Physical Aggression / Violence:No  Access to Firearms a concern: No  Gang Involvement:No  Treatment plan: Will employ cognitive behavioral therapy as well as grief and loss therapy  for relief of anxiety and grief.  Goals of grief therapy or to have a healthier response to the loss of his daughter and less sadness as evidenced by patient report in therapy notes as well as to help him feel less overwhelmed by her loss.  Goals are to see a 50% reduction in grief symptoms over the next 6 months.  I will encourage the use of the patient telling his grief story about his daughter including encouraging him to bring pict ures and memorabilia to help process his feelings including any feelings of guilt associated with her loss.  We will use cognitive behavioral therapy to help identify and change anxiety producing thoughts and behavior patterns so as to improve his ability to better manage anxiety and stress, and manage thoughts and worrisome thinking contributing to anxiety.  We will also refresh and encourage dialectical behavior therapy distress toler ance  and mindfulness skills with the intention of reducing anxiety by 50% over the next 6 months. Progress: 30% Interventions: Cognitive Behavioral Therapy  Diagnosis:PTSD, Bi-Polar D/O  Plan: F/U/ appointments scheduled weekly.  Cecile Coder,  [facility]                                                                                                                    Cecile Coder, [facility]  [place] [facility] Counselor/Therap ist Progress Note  Patient ID: Sayge Salvato, MRN: [id number],    Date: 04/01/2024 This session was held via video teletherapy. The patient consented to the video teletherapy and was located in his home during this session. He is aware it is the responsibility of the patient to secure confidentiality on his end of the session. The provider was in a private home office for the duration of this session.   The patient arrived on   time for her Caregility session  Time Spent: 59 minutes, 2 PM to 2:59 PM  Treatment Type: Individual Therapy Reported Symptoms: anxiety, panic attacks The patient continues to present with physical pain and both his back and his knee.  He started with a new physical therapist and told him that there was a lot of arthritis in both knees and or certain things he cannot do but they had him do that anyway and now for the past 3 days he  has been in significant knee pain.  He has continued to walk because he knows that is good for him in various ways but says it has been painful.  His wife also was not feeling well but she is going back to the doctor next week to have an ablation and hopes that will bring her some relief.  For the most part he has been at the last couple weeks reaching back out and hearing from some old friends as well as time with his son and son's fa mily.  He says that his wife's cousin is coming down for a weekend soon and they enjoy time with her and that he will go to his brother's house sometime in the next few weeks.  He recognizes the need for people that he cares about in helping with his depression. We will continue to work on coping skills for helping with his anxiety and the progression of his Lewy body dementia diagnosis as well as processing his grief. Mental Status E xam: Appearance:  Well Groomed  Behavior: Appropriate Motor: Pt. Reports some instability due to Lewey Body dementia Speech/Language:  Normal Rate Affect: Appropriate Mood: normal Thought process: normal Thought content:  WNL Sensory/Perceptual disturbances:  WNL Orientation: oriented to person, place, and situation Attention: Good Concentration: Good Memory: Impaired short term Fund of knowledge:  Good Insight:  Goo d Judgment:  Good Impulse Control: Good  Risk Assessment: Danger to Self: No Self-injurious Behavior: No Danger to Others: No Duty to Warn:no Physical Aggression /  Violence:No  Access to Firearms a concern: No  Gang Involvement:No  Treatment plan: Will employ cognitive behavioral therapy as well as grief and loss therapy for relief of anxiety and grief.  Goals of grief therapy or to have a healthier response to the loss of  his daughter and less sadness as evidenced by patient report in therapy notes as well as to help him feel less overwhelmed by her loss.  Goals are to see a 50% reduction in grief symptoms over the next 6 months.  I will encourage the use of the patient telling his grief story about his daughter including encouraging him to bring pictures and memorabilia to help process his feelings including any feelings of guilt associated with her los s.  We will use cognitive behavioral therapy to help identify and change anxiety producing thoughts and behavior patterns so as to improve his ability to better manage anxiety and stress, and manage thoughts and worrisome thinking contributing to anxiety.  We will  also refresh and encourage dialectical behavior therapy distress tolerance and mindfulness skills with the intention of reducing anxiety by 50% over the next 6 months. Progre ss: 30% Interventions: Cognitive Behavioral Therapy  Diagnosis:PTSD, Bi-Polar D/O  Plan: F/U/ appointments scheduled weekly.  Cecile Coder, [facility]                                                                                                                   Cecile Coder, [facility]               Cecile Coder, [facility]               Cecile Coder, [facility]               Cecile Coder,  [facility]  [place] [facility] Counselor/Therapist Progress Note  Patient ID: Traevon Meiring, MRN: [id number],    Date: 04/01/2024 This session was held via video teletherapy. The patient consented to the video teletherapy and was located in his home during this session. He is aware it is the responsibility of the patient to secure confidentiality on his end of the session. The provider was in a private home office for the duration of this  session.   The patient arrived on time for her Caregility session  Time Spent: 57 minutes, 2 PM to 2:57 PM  Treatment Type: Individual Therapy Reported Symptoms: anxiety, panic attacks The patient is still having significant back pain.  He cannot sit anywhere for very long and has to be  careful where he sits.  There is still some unsteadiness although he did not report any falls over the past 2 weeks.  His biggest concern now i s for his wife.  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  She is meeting with her cardiologist again next week to see what possible treatment is for her.  He still thinks there may be some issue with her diaphragm but she is meeting with the pulmonologist also.  He is doing what he can to help her.  His daughter's birthday is March 11 so he knows that  is a difficult time for both he and his wife next week.  Typically they do something to remember her but because of the wife's current health issues they cannot go to [place] as they had planned so they are talking about what to do to remember and celebrate her birthday.  We will continue to work on coping skills for helping with his anxiety and the progression of his Lewy body dementia diagnosis as well as processing his grief.  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 Speech/Language:  Normal Rate Affect: Appropriate Mood: normal Thought process: normal Thought content:  WNL Sensory/Perceptual disturbances:  WNL Orientation: oriented to person, place, and situation Attention: Good Concentration: Good Memory: Impaired short term Fund of knowledge:  Goo d Insight:  Good Judgment:  Good Impulse Control: Good  Risk Assessment: Danger to Self: No Self-injurious Behavior: No Danger to Others: No Duty to Warn:no Physical Aggression / Violence:No  Access to Firearms a concern: No  Gang Involvement:No  Treatment plan: Will employ cognitive behavioral therapy as well as grief and loss therapy for relief of anxiety and grief.  Goals of grief therapy or to have a healthier respons e to the loss of his daughter and less  sadness as evidenced by patient report in therapy notes as well as to help him feel less overwhelmed by her loss.  Goals are to see a 50% reduction in grief symptoms over the next 6 months.  I will encourage the use of the patient telling his grief story about his daughter including encouraging him to bring pictures and memorabilia to help process his feelings including any feelings of guilt associ ated with her loss.  We will use cognitive behavioral therapy to help identify and change anxiety producing thoughts and behavior patterns so as to improve his ability to better manage anxiety and stress, and manage thoughts and worrisome thinking contributing to anxiety.  We will also refresh and encourage dialectical behavior therapy distress tolerance and mindfulness  skills with the intention of reducing anxiety by 50% over the next  6 months. Progress: 30% Interventions: Cognitive Behavioral Therapy  Diagnosis:PTSD, Bi-Polar D/O  Plan: F/U/ appointments scheduled weekly.  Cecile Coder, [facility]                                                                                                                   Cecile Coder, [facility]  [place] [facility] Counselor/Therapist Progress Note  Patient ID: Loring Liskey, MRN: [id number],    Date: 04/01/2024  This session was held via video teletherapy. The patient consented to the video teletherapy and was located in his home during this session. He is aware it is the responsibility of the patient to secure confidentiality on his end of the session. The provider was in a private home office for the duration of this session.   The patient arrived on time for her Caregility session  Time Spent: 58 minutes, 2 PM to 2:58 PM  Treatment T ype: Individual Therapy Reported Symptoms: anxiety, panic attacks A fairly difficult week because it was his daughter's birthday.  He said this year was more difficult than last year but he could not pinpoint why.  They did remember her about going out to a nice restaurant which is something her daughter always wanted to do.  He said they were able to laugh and joke about funny things that his daughter had said or done which is not so mething they were able to do this time last year so he saw that as healthy grieving.  He knows that the time change threw things off a little bit for everybody and his wife still is not  feeling well and his back has been bothering him.  He was not able to go to his first round of physical therapy this week because his back was bothering him more.  He has been thinking a lot more about his daughter which he feels comfortable with.  He is  thankful that it is getting warmer and staying light longer so he can get back to some of the coping skills such as fishing that he has done in the past.   We will continue to work on coping skills for helping with his anxiety and the progression of his Lewy body dementia diagnosis as well as processing his grief. Mental Status Exam: Appearance:  Well Groomed  Behavior: Appropriate Motor: Pt. Reports some instability due to Le wey Body dementia Speech/Language:  Normal Rate Affect: Appropriate Mood: normal Thought process: normal Thought content:  WNL Sensory/Perceptual disturbances:  WNL Orientation: oriented to person, place, and situation Attention: Good Concentration: Good Memory: Impaired short term Fund of knowledge:  Good Insight:  Good Judgment:  Good Impulse Control: Good  Risk Assessment: Danger to Self: No Self-injurious Behav ior: No Danger to Others: No Duty to Warn:no Physical Aggression / Violence:No  Access to Firearms a concern: No  Gang Involvement:No  Treatment plan: Will employ cognitive behavioral therapy as well as grief and loss therapy for relief of anxiety and grief.  Goals of grief therapy or to have a healthier response to the loss of his daughter and less sadness as evidenced by patient report in therapy notes as well as to help him fee l less overwhelmed by her loss.  Goals are to see a 50% reduction in grief symptoms over the next 6 months.  I will encourage the use of the patient telling his grief story about his daughter including encouraging him to bring pictures and memorabilia to help process his feelings including any feelings of guilt associated with her loss.  We will use cognitive  behavioral therapy to help identify and change anxiety producing thoughts and  behavior patterns so as to improve his ability to better manage anxiety and stress, and manage thoughts and worrisome thinking contributing to anxiety.  We will also refresh and encourage dialectical behavior therapy  distress tolerance and mindfulness skills with the intention of reducing anxiety by 50% over the next 6 months. Progress: 30% Interventions: Cognitive Behavioral Therapy  Diagnosis:PTSD, Bi-Polar D/O  Plan: F/U/ appoi ntments scheduled weekly.  Cecile Coder, [facility]                                                                                                                   Cecile Coder, [facility]               Cecile Coder, [facility]               Cecile Coder, [facility]  [place] [facility] Counselor/Therapist Progress Note  Patient ID: Kongmeng Santoro, MRN: [id number],    Date: 04/01/2024 This session was held via v ideo teletherapy. The patient consented to the video teletherapy and was located in his home during this session. He is aware it is the responsibility of the patient to secure confidentiality on his end of the session. The provider was in a private home office for the duration of this session.   The patient arrived on time for her Caregility session  Time Spent: 57 minutes, 2 PM to 2:57 PM  Treatment Type: Individual Therapy Repo rted Symptoms: anxiety, panic attacks The patient is still having significant back pain.  He cannot sit anywhere for very long and has to be careful where he sits.  There is still some unsteadiness although he did not report any falls over the past 2 weeks.  His biggest concern now is for his wife.  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  She is meetin g with her cardiologist again next week to see what possible treatment is for her.  He still thinks there may be some issue with her diaphragm but she is meeting with the pulmonologist also.  He is doing what he can to help her.  His daughter's birthday is March 11 so he knows that is a difficult time for both he and his wife next week.  Typically they do something to remember her but because of the wife's current health issues they c annot go to [place] as they had planned so they are talking about what to do to remember and celebrate her birthday.  We will continue to work on coping skills for helping with  his anxiety and the progression of his Lewy body dementia diagnosis as well as processing his grief. 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 Speech/Language :  Normal Rate Affect: Appropriate Mood: normal Thought process: normal Thought content:  WNL Sensory/Perceptual disturbances:  WNL Orientation: oriented to person, place, and situation Attention: Good Concentration: Good Memory: Impaired short term Fund of knowledge:  Good Insight:  Good Judgment:  Good Impulse Control: Good  Risk Assessment: Danger to Self: No Self-injurious Behavior: No Danger to Others: No Dut y to Warn:no Physical Aggression / Violence:No  Access to Firearms a concern: No  Gang Involvement:No  Treatment plan: Will employ cognitive behavioral therapy as well as grief and loss therapy for relief of anxiety and grief.  Goals of grief therapy or to have a healthier response to the loss of his daughter and less sadness as evidenced by patient report in therapy notes as well as to help him feel less overwhelmed by her loss.  G oals are to see a 50% reduction in grief symptoms over the next 6 months.  I will encourage the use of the patient telling his grief story about his daughter including encouraging him to bring pictures and memorabilia to help process his feelings including any feelings of guilt associated with her loss.  We will use cognitive behavioral therapy to help identify and change anxiety producing thoughts and behavior patterns so as to improve  his ability to better manage anxiety and stress, and manage thoughts and worrisome thinking contributing to anxiety.  We will also refresh and encourage dialectical behavior therapy distress tolerance and mindfulness skills  with the intention of reducing anxiety by 50% over the next 6 months. Progress: 30% Interventions: Cognitive Behavioral  Therapy  Diagnosis:PTSD, Bi-Polar D/O  Plan: F/U/ appointments scheduled weekly.  Cecile Coder, [facility]                                                                                                                   Cecile Coder, [facility]   Behavioral Health Counselor/Therapist Progress Note  Patient ID: Josue Falconi, MRN: [id number],    Date: 04/01/2024 This session was held via video teletherapy. The patient consented to the video teletherapy and was located in his home during this session. He is aware  it is the responsibility of the patient to secure confidentiality on his end of the  session. The provider was in a private home office for the duration of this session.   The patient arrived on time for her Caregility session  Time Spent: 57 minutes, 2 PM to 2:57 PM  Treatment Type: Individual Therapy Reported Symptoms: anxiety, panic attacks  We will continue to work on coping skills for helping with his anxiety and the progre ssion of his Lewy body dementia diagnosis as well as processing his grief. 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 Speech/Language:  Normal Rate Affect: Appropriate Mood: normal Thought process: normal Thought content:  WNL Sensory/Perceptual disturbances:  WNL Orientation: oriented to person, place, and situation Attention: Good  Concentration: Good Memory: Impaired short term Fund of knowledge:  Good Insight:  Good Judgment:  Good Impulse Control: Good  Risk Assessment: Danger to Self: No Self-injurious Behavior: No Danger to Others: No Duty to Warn:no Physical Aggression / Violence:No  Access to Firearms a concern: No  Gang Involvement:No  Treatment plan: Will employ cognitive behavioral therapy as well as grief and loss therapy for relief of  anxiety and grief.  Goals of grief therapy or to have a healthier response to the loss of his daughter and less sadness as evidenced by patient report in therapy notes as well as to help him feel less overwhelmed by her loss.  Goals are to see a 50% reduction in grief symptoms over the next 6 months.  I will encourage the use of the patient telling his grief story about his daughter including encouraging him to bring pictures and memor abilia to help process his feelings including any feelings of guilt associated with her loss.  We will use cognitive behavioral therapy to help identify and change anxiety producing thoughts and behavior patterns so as to improve his ability to better manage anxiety  and stress, and manage thoughts and worrisome thinking contributing to anxiety.  We will also refresh and encourage dialectical behavior therapy distress tolerance and mindf ulness skills with the intention of reducing anxiety by 50% over the next 6 months. Progress: 30% Interventions: Cognitive Behavioral Therapy  Diagnosis:PTSD, Bi-Polar D/O  Plan: F/U/ appointments scheduled weekly.  Cecile Coder, [facility]                                                                                                                    Cecile Coder, [facility]               Cecile Coder, [facility]               Cecile Coder, [facility]  Cecile Coder, [facility]               Cecile Coder, [facility]               Cecile Coder, [facility]               Cecile Coder, [facility]  [facility] Counselor/Therapist Progress Note  Patient ID: Deone Leifheit, MRN: [id number],    Date: 04/01/2024 This session was held via video teletherapy. The patient consented t o the video teletherapy and was located in his home during this session. He is aware it is the responsibility of the patient to secure confidentiality on his end of the session. The provider was in a private home office for the duration of this session.   The patient arrived on time for her Caregility session  Time Spent: 39 minutes, 2 PM to 2:39 PM  Treatment Type: Individual Therapy Reported Symptoms: anxiety, panic attacks Th e patient was not feeling well today.  He started feeling really earlier in the week having a fever bad headaches body aches.  He felt a little better yesterday but feels a little bit of a resurgence in headaches and body aches and is running a low-grade fever.  His wife is feeling some of the same symptoms.  For the most part he says he is doing well.  They did increase his memory medication because they were starting to see some incre asing decline in short-term memory and he said until recently his long-term memory had been good but he was starting to see a difference in how he used to markers to remember what happened at certain times historically.  He feels the medication has helped and has brightened his mood a little also.  He is still trying to walk every day and fish when he can and has enjoyed watching the [employer] as a good distraction.  He re ports that he feels he is managing mood fairly well.  He has an appointment in 2 weeks.  He does contract for safety having no thoughts of hurting himself or anyone else. We will  continue to work on coping skills for helping with his anxiety and the progression of his Lewy body dementia diagnosis as well as processing his grief. Mental Status Exam: Appearance:  Well Groomed  Behavior: Appropriate Motor: Pt. Reports some instabi lity due to Lewey Body dementia Speech/Language:  Normal Rate Affect: Appropriate Mood: normal Thought process: normal Thought content:  WNL Sensory/Perceptual disturbances:  WNL Orientation: oriented to person, place, and situation Attention: Good Concentration: Good Memory: Impaired short term Fund of knowledge:  Good Insight:  Good Judgment:  Good Impulse Control: Good  Risk Assessment: Danger to Self: No Self-i njurious Behavior: No Danger to Others: No Duty to Warn:no Physical Aggression / Violence:No  Access to Firearms a concern: No  Gang Involvement:No  Treatment plan: Will employ cognitive behavioral therapy as well as grief and loss therapy for relief of anxiety and grief.  Goals of grief therapy or to have a healthier response to the loss of his daughter and less sadness as evidenced by patient report in therapy notes as well as t o help him feel less overwhelmed by her loss.  Goals are to see a 50% reduction in grief symptoms over the next 6 months.  I will encourage the use of the patient telling his grief story about his daughter including encouraging him to bring pictures and memorabilia to help process his feelings including any feelings of guilt associated with her loss.  We will use cognitive behavioral therapy to help identify and change anxiety producing  thoughts and behavior patterns so as to improve his ability to better manage anxiety and stress, and manage thoughts and worrisome thinking contributing to anxiety.  We will also refresh and  encourage dialectical behavior therapy distress tolerance and mindfulness skills with the intention of reducing anxiety by 50% over the next 6 months. Progress:  30% Interventions: Cognitive Behavioral Therapy  Diagnosis:PTSD, Bi-Polar D/O  Pl an: F/U/ appointments scheduled weekly.  Cecile Coder, [facility]                                                                                                                   Cecile Coder, [facility]  [facility] Counselor/Therapist Progress Note  Patient ID: Daine Gunther, MRN: [id number],    Date: 04/01/2024 This session was held via video teletherapy. The patient consented to the video teletherapy and was locat ed in his home during this session. He is aware it is the responsibility of the patient to secure  confidentiality on his end of the session. The provider was in a private home office for the duration of this session.   The patient arrived on time for her Caregility session  Time Spent: 58 minutes, 2 PM to 2:58 PM  Treatment Type: Individual Therapy Reported Symptoms: anxiety, panic attacks A fairly difficult week because it was  his daughter's birthday.  He said this year was more difficult than last year but he could not pinpoint why.  They did remember her about going out to a nice restaurant which is something her daughter always wanted to do.  He said they were able to laugh and joke about funny things that his daughter had said or done which is not something they were able to do this time last year so he saw that as healthy grieving.  He knows that the tim e change threw things off a little bit for everybody and his wife still is not feeling well and his back has been bothering him.  He was not able to go to his first round of physical therapy this week because his back was bothering him more.  He has been thinking a lot more about his daughter which he feels comfortable with.  He is thankful that it is getting warmer and staying light longer so he can get back to some of the coping skill s such as fishing that he has done in the past.   We will continue to work on coping skills for helping with his anxiety and the progression of his Lewy body dementia diagnosis as well as processing his grief. 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 Speech/Language:  Normal Rate Affect: Appropriate Mood: normal Thought process: norma l Thought content:  WNL Sensory/Perceptual disturbances:  WNL Orientation: oriented to person, place, and situation Attention: Good Concentration: Good Memory: Impaired short term Fund of knowledge:  Good Insight:  Good Judgment:  Good Impulse Control: Good  Risk  Assessment: Danger to Self: No Self-injurious Behavior: No Danger to Others: No Duty to Warn:no Physical Aggression / Violence:No  Access to Firearms a co ncern: No  Gang Involvement:No  Treatment plan: Will employ cognitive behavioral therapy as well as grief and loss therapy for relief of anxiety and grief.  Goals of grief therapy or to have a healthier response to the loss of his daughter and less sadness as evidenced by patient report in therapy notes as well as to help him feel less overwhelmed by her loss.  Goals are to see a 50% reduction in grief symptoms over the next 6 months.   I will encourage the use of the patient telling his grief story about his daughter including encouraging him to bring pictures and memorabilia to help process his feelings including any feelings of guilt associated with her loss.  We will use cognitive behavioral therapy to help identify and change anxiety producing thoughts and behavior patterns so as to improve his ability to better manage anxiety and stress, and manage thoughts and  worrisome thinking contributing to anxiety.  We will also refresh and encourage dialectical behavior therapy  distress tolerance and mindfulness skills with the intention of reducing anxiety by 50% over the next 6 months. Progress: 30% Interventions: Cognitive Behavioral Therapy  Diagnosis:PTSD, Bi-Polar D/O  Plan: F/U/ appointments scheduled weekly.  Cecile Coder, [facility]                                                                                                                    Cecile Coder, [facility]               Cecile Coder, [facility]               Cecile Coder, [facility]  [facility] Counselor/Therapist Progress Note  Patient ID: Dornell Grasmick, MRN: [id number],    Date: 04/01/2024 This session was held via video teletherapy. The patient consented to the video teletherapy and was located in his home during this session. He is  aware it is the responsibility of the patient to secure confidentiality on his end of the session. The provider was in a private home office for the duration of this session.   The patient arrived on time for her Caregility session  Time Spent: 1:05 PM until 2 PM, 55 minutes Treatment Type: Individual Therapy Reported Symptoms: anxiety, panic attacks The patient said he came in from his walk this morning and saw what he thought  was some sort of tic from his wife.  It was not the first time it happened and he wonders if it might be tardive dyskinesia because of some new medications that she is on.  She has a  call into the doctor right now and hopes to hear something back soon.  They have narrowed down to the fact they think most of her discomfort in breathing is coming from the diaphragm and they are looking for alternatives for treatment.  The patient conti nues to do those things which help him cope including walking, fishing when he can, spending time with his son and daughter-in-law and grandson.  He also has a way of continued coping with grief he is writing letters to his daughters as a way of expressing his feelings and says that is very beneficial.  He appears to be grieving in a healthy way but he knows that he to year anniversary of her death is coming up and there is some grief t here but says it is not overwhelming.  We will continue to work on coping skills for helping with his anxiety and the progression of his Lewy body dementia diagnosis as well as processing his grief. 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 Speech/Language:  Normal Rate Affect: Appropriate Mood: normal Thought process: normal Thought c ontent:  WNL Sensory/Perceptual disturbances:  WNL Orientation: oriented to person, place, and situation Attention: Good Concentration: Good Memory: Impaired short term Fund of knowledge:  Good Insight:  Good Judgment:  Good Impulse Control: Good  Risk Assessment: Danger to Self: No Self-injurious Behavior: No Danger to Others: No Duty to Warn:no Physical Aggression / Violence:No  Access to Firearms a concern: No   Gang Involvement:No  Treatment plan: Will employ cognitive behavioral therapy as well as grief and loss therapy for relief of anxiety and grief.  Goals of grief therapy or to have a healthier response to the loss of his daughter and less sadness as evidenced by patient report in therapy notes as well as to help him feel less overwhelmed by her loss.  Goals are to  see a 50% reduction in grief symptoms over the next 6 months.  I will enc ourage the use of the patient telling his grief story about his daughter including encouraging him to bring pictures and memorabilia to help process his feelings including any feelings of guilt associated with her loss.  We will use cognitive behavioral therapy to help identify and change anxiety producing thoughts and behavior patterns so as to improve his ability to better manage anxiety and stress, and manage thoughts and worrisome t hinking contributing to anxiety.  We will also refresh and encourage dialectical behavior therapy distress tolerance  and mindfulness skills with the intention of reducing anxiety by 50% over the next 6 months. Progress: 30% Interventions: Cognitive Behavioral Therapy  Diagnosis:PTSD, Bi-Polar D/O  Plan: F/U/ appointments scheduled weekly.  Cecile Coder, [facility]                                                                                                                    Cecile Coder, [facility]  [facility] Counselor/Therapist Progress Note  Patient ID: Lumir Demetriou, MRN: [id number],    Date: 04/01/2024 This session was held via video teletherapy. The patient consented to the video teletherapy and was located in his home during this session. He is aware it is the responsibility of the patient to secure confidentiality on his end of the s ession. The provider was in a private home office for the duration of this session.   The patient arrived on time for her Caregility session  Time Spent: 59 minutes, 2 PM to 2:59 PM  Treatment Type: Individual Therapy Reported Symptoms: anxiety, panic attacks The patient continues to present with physical pain and both his back and his knee.  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  He has continued to walk because he knows that is good for him in various ways but says it has been painful.  His wife also was not feeling well but she is going back to the doctor next week to have an ablation and hopes that will bring her some relief.  For the most part he has been at the  last couple weeks reaching back out and hearing from some old friends as well as time with his son and son's family.  He says that his wife's cousin is coming down for a weekend soon  and they enjoy time with her and that he will go to his brother's house sometime in the next few weeks.  He recognizes the need for people that he cares about in helping with his depression. We will continue to work on coping skills for helping with his a nxiety and the progression of his Lewy body dementia diagnosis as well as processing his grief. 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 Speech/Language:  Normal Rate Affect: Appropriate Mood: normal Thought process: normal Thought content:  WNL Sensory/Perceptual disturbances:  WNL Orientation: oriented to person, place, and situat ion Attention: Good Concentration: Good Memory: Impaired short term Fund of knowledge:  Good Insight:  Good Judgment:  Good Impulse Control: Good  Risk Assessment: Danger to Self: No Self-injurious Behavior: No Danger to Others: No Duty to Warn:no Physical Aggression / Violence:No  Access to Firearms a concern: No  Gang Involvement:No  Treatment plan: Will employ cognitive behavioral therapy as well as grief and loss  therapy for relief of anxiety and grief.  Goals of grief therapy or to have a healthier response to the loss of his daughter and less sadness as evidenced by patient report in therapy notes as well as to help him feel less overwhelmed by her loss.  Goals are to see a 50% reduction in grief symptoms over the next 6 months.  I will encourage the use of the patient telling his grief story about his daughter including encouraging him to bri ng pictures and memorabilia to help process his feelings including any feelings of guilt associated with her loss.  We will use cognitive behavioral therapy to help identify and change anxiety producing thoughts and behavior patterns so as to improve his ability to better manage anxiety and stress, and manage thoughts and worrisome thinking contributing to anxiety.   We will also  refresh and encourage dialectical behavior therapy distres s tolerance and mindfulness skills with the intention of reducing anxiety by 50% over the next 6 months. Progress: 30% Interventions: Cognitive Behavioral Therapy  Diagnosis:PTSD, Bi-Polar D/O  Plan: F/U/ appointments scheduled weekly.  Cecile Coder, [facility]                                                                                                                    Cecile Coder, [facility]               Cecile Coder, [facility]               Cecile Coder, [facility]               Cecile Coder, [facility]  [facility] Counselor/Therapist Progress Note  Patient ID: Clester Chlebowski, MRN: [id number],    Date: 04/01/2024 This session was held via video teletherapy. The patient consented to the video teletherapy and was located in his home during this session. He is aware it is the responsibility of the patient to secu re confidentiality on his end of the session. The provider was in a private home office for the duration of this session.   The patient arrived on time for her Caregility session  Time Spent: 57 minutes, 2 PM to 2:57 PM  Treatment Type: Individual Therapy Reported Symptoms: anxiety, panic attacks The patient is still having significant back pain.  He cannot sit anywhere for very long and has to be careful where he sits.  There i s still some unsteadiness although he did not report any falls over the past 2 weeks.  His biggest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

## 2024-04-01 NOTE — Progress Notes (Incomplete)
                                                                                                                                                                                                                                                                                                                                                                                                                                                                                                                                                                                                                                [facility] Counselor/Therapist Progress Note  Patient ID: Kodee Ravert, MRN: [id number],     Date: 04/01/2024 This session was held via video teletherapy. The patient consented to the video teletherapy and was located in his home during this session. He is aware it is the responsibility of the patient to secure confidentiality on his end of the session. The provider was in a private home office for the duration of this session.   The patient arrived on time for her Caregility session  Time Spent: 59 minutes, 2 PM to 2 :59 PM  Treatment Type: Individual Therapy Reported Symptoms: anxiety, panic attacks The patient said he jokingly said to his wife that he had not had any pain issues in the cervical  area and then a few days ago he started having pain in his neck and shoulders.  He now sits related to degenerative disk and has some consistent pain somewhere in his spine with that but jokingly says it just depends on Wednesday which part of his body i s decides to show up and hurt.  He is doing the best that he can.  He is still walking which he knows is good for him but does not have right now the strength to do as much in the morning as he has done in the past so he is denying that to 3 times a day for consistency.  He is still doing some positive things and that he is staying in touch with his brother and other family members.  He is reconnecting with friends from high school and  he is which she is enjoying.  One of them has had a similar experience in that she lost her son in the same way the patient lost his daughter.  He said they did not think and Easter in which they set a place for his daughter at the table and put her picture there.  He initially had reservations about doing that he and his wife had made everyone feel as if she was really there with him.  He still feels that he is grieving in a healthy wa y.  He and his wife have always taken a trip around both of their birthdays which are 2 days apart.  That is now close to his daughter died so they have started taking some of her rashes with him and sprinkling them where they go as a way of remembrance.  They are deciding what that looks like this year in July.  He does contract for safety having no thoughts of hurting himself or anyone else. We will continue to work on coping skill s for helping with his anxiety and the progression of his Lewy body dementia diagnosis as well as processing his grief. 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 Speech/Language:  Normal Rate Affect: Appropriate Mood: normal Thought process: normal Thought content:   WNL Sensory/Perceptual disturbances:  WNL Orientation: oriented to p erson, place, and situation Attention: Good Concentration: Good Memory: Impaired short term Fund of knowledge:  Good Insight:  Good Judgment:  Good Impulse Control: Good  Risk Assessment: Danger to Self: No Self-injurious Behavior: No Danger to Others: No Duty to Warn:no Physical Aggression / Violence:No  Access to Firearms a concern: No  Gang Involvement:No  Treatment plan: Will employ cognitive behavioral therapy as  well as grief and loss therapy for relief of anxiety and grief.  Goals of grief  therapy or to have a healthier response to the loss of his daughter and less sadness as evidenced by patient report in therapy notes as well as to help him feel less overwhelmed by her loss.  Goals are to see a 50% reduction in grief symptoms over the next 6 months.  I will encourage the use of the patient telling his grief story about his daughter includin g encouraging him to bring pictures and memorabilia to help process his feelings including any feelings of guilt associated with her loss.  We will use cognitive behavioral therapy to help identify and change anxiety producing thoughts and behavior patterns so as to improve his ability to better manage anxiety and stress, and manage thoughts and worrisome thinking contributing to anxiety.  We will also refresh and encourage dialectical  behavior therapy distress tolerance and mindfulness skills with the intention of reducing anxiety by 50% over the next 6 months. Progress: 30% Interventions: Cognitive Behavioral Therapy  Diagnosis:PTSD, Bi-Polar D/O  Plan: F/U/ appointments scheduled weekly.  Cecile Coder, [facility]                                                                                                                    Cecile Coder,  [facility]  [place] [facility] Counselor/Therapist Progress Note  Patient ID: Janiel Crisostomo, MRN: [id number],    Date: 04/01/2024 This session was held via video teletherapy. The patient consented to the video teletherapy and was located in his home during this session. He is aware it is the responsibility of the patient to secure confidentiality on his end of the session. The provider was in a private home office for the duration of this sessio n.   The patient arrived on time for her Caregility session  Time Spent: 58 minutes, 2 PM to 2:58 PM  Treatment Type: Individual Therapy Reported Symptoms: anxiety, panic attacks A fairly difficult week because it was his daughter's birthday.  He said this year was more  difficult than last year but he could not pinpoint why.  They did remember her about going out to a nice restaurant which is something her daughter always wanted  to do.  He said they were able to laugh and joke about funny things that his daughter had said or done which is not something they were able to do this time last year so he saw that as healthy grieving.  He knows that the time change threw things off a little bit for everybody and his wife still is not feeling well and his back has been bothering him.  He was not able to go to his first round of physical therapy this week because his b ack was bothering him more.  He has been thinking a lot more about his daughter which he feels comfortable with.  He is thankful that it is getting warmer and staying light longer so he can get back to some of the coping skills such as fishing that he has done in the past.   We will continue to work on coping skills for helping with his anxiety and the progression of his Lewy body dementia diagnosis as well as processing his grief.  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 Speech/Language:  Normal Rate Affect: Appropriate Mood: normal Thought process: normal Thought content:  WNL Sensory/Perceptual disturbances:  WNL Orientation: oriented to person, place, and situation Attention: Good Concentration: Good Memory: Impaired short term Fund of knowledge:  Good  Insight:  Good Judgment:  Good Impulse Control: Good  Risk Assessment: Danger to Self: No Self-injurious Behavior: No Danger to Others: No Duty to Warn:no Physical Aggression / Violence:No  Access to Firearms a concern: No  Gang Involvement:No  Treatment plan: Will employ cognitive behavioral therapy as well as grief and loss therapy for relief of anxiety and grief.  Goals of grief therapy or to have a healthier response  to the loss of his  daughter and less sadness as evidenced by patient report in therapy notes as well as to help him feel less overwhelmed by her loss.  Goals are to see a 50% reduction in grief symptoms over the next 6 months.  I will encourage the use of the patient telling his grief story about his daughter including encouraging him to bring pictures and memorabilia to help process his feelings including any feelings of guilt associat ed with her loss.  We will use cognitive behavioral therapy to help identify and change anxiety producing thoughts and behavior patterns so as to improve his ability to better manage anxiety and stress, and manage thoughts and worrisome thinking contributing to anxiety.  We will also refresh and encourage dialectical behavior therapy  distress tolerance and mindfulness skills with the intention of reducing anxiety by 50% over the next 6  months. Progress: 30% Interventions: Cognitive Behavioral Therapy  Diagnosis:PTSD, Bi-Polar D/O  Plan: F/U/ appointments scheduled weekly.  Cecile Coder, [facility]                                                                                                                   Cecile Coder, [facility]                Cecile Coder, [facility]               Cecile Coder, [facility]  [place] Counselor/Therapist Progre ss Note  Patient ID: Nazir Hacker, MRN: [id number],    Date: 04/01/2024 This session was held via video teletherapy. The patient consented to the video teletherapy and was located in his home during this session. He is aware it is the responsibility of the patient to secure confidentiality on his end of the session. The provider was in a private home office for the duration of this session.   The patient arrived on time for h er Caregility session  Time Spent: 1:05 PM until 2 PM, 55 minutes Treatment Type: Individual Therapy Reported Symptoms: anxiety, panic attacks The patient said he came in from his walk this morning and saw what he thought was some sort of tic from his wife.  It was not the first time it happened and he wonders if it might be tardive dyskinesia because of some new medications that she is on.  She has a call into the doctor right now  and hopes to hear something back soon.  They have narrowed down to the fact they think most of her discomfort in breathing is coming from the diaphragm and they are looking for  alternatives for treatment.  The patient continues to do those things which help him cope including walking, fishing when he can, spending time with his son and daughter-in-law and grandson.  He also has a way of continued coping with grief he is writing lette rs to his daughters as a way of expressing his feelings and says that is very beneficial.  He appears to be grieving in a healthy way but he knows that he to year anniversary of her death is coming up and there is some grief there but says it is not overwhelming.  We will continue to work on coping skills for helping with his anxiety and the progression of his Lewy body dementia diagnosis as well as processing his grief. Mental Statu s Exam: Appearance:  Well Groomed  Behavior: Appropriate Motor: Pt. Reports some instability due to Lewey Body dementia Speech/Language:  Normal Rate Affect: Appropriate Mood: normal Thought process: normal Thought content:  WNL Sensory/Perceptual disturbances:  WNL Orientation: oriented to person, place, and situation Attention: Good Concentration: Good Memory: Impaired short term Fund of knowledge:  Good Insight:   Good Judgment:  Good Impulse Control: Good  Risk Assessment: Danger to Self: No Self-injurious Behavior: No Danger to Others: No Duty to Warn:no Physical Aggression / Violence:No  Access to Firearms a concern: No  Gang Involvement:No  Treatment plan: Will employ cognitive behavioral therapy as well as grief and loss therapy for relief of anxiety and grief.  Goals of grief therapy or to have a healthier response to the loss  of his daughter and less sadness as evidenced by patient report in therapy notes as well as to help him feel less overwhelmed by her loss.  Goals are to see a 50% reduction in grief symptoms over the next 6 months.  I will encourage the use of the patient telling his grief story about his daughter including encouraging him to bring pictures and memorabilia to  help process his feelings including any feelings of guilt associated with her  loss.  We will use cognitive behavioral therapy to help identify and change anxiety producing thoughts and behavior patterns so as to improve his ability to better manage anxiety and stress, and manage thoughts and worrisome thinking contributing to anxiety.  We will also refresh and encourage dialectical behavior therapy distress tolerance  and mindfulness skills with the intention of reducing anxiety by 50% over the next 6 months. Pro gress: 30% Interventions: Cognitive Behavioral Therapy  Diagnosis:PTSD, Bi-Polar D/O  Plan: F/U/ appointments scheduled weekly.  Cecile Coder, [facility]                                                                                                                   Cecile Coder, [facility]  [facility] Counselor/Therapist Progress Note  Patient ID: Bruin Bolger, MRN: [id number],    Date: 04/01/2024 This session  was held via video teletherapy. The patient consented to the video teletherapy and was located in his home during this session. He is aware it is the responsibility of the patient to secure confidentiality on his end of the session. The provider was in a private home office for the duration of this session.   The patient arrived on time for her Caregility session  Time Spent: 59 minutes, 2 PM to 2:59 PM  Treatment Type: Individua l Therapy Reported Symptoms: anxiety, panic attacks The patient continues to present with physical pain and both his back and his knee.  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  He has continued to walk because he knows that is good for him in  various ways but says it has been painful.  His wife also was not feeling well but she is going back to the doctor next week to have an ablation and hopes that will bring her some relief.  For the most part he has been at the last couple weeks reaching back out and hearing from some old friends as well as time with his son and son's family.  He says that his wife's cousin is coming down for a weekend soon and they enjoy time with her a nd that he will go to his brother's house sometime in the next few weeks.  He recognizes the need for people that he cares about in helping with his depression. We will continue to  work on coping skills for helping with his anxiety and the progression of his Lewy body dementia diagnosis as well as processing his grief. Mental Status Exam: Appearance:  Well Groomed  Behavior: Appropriate Motor: Pt. Reports some instability due to  Lewey Body dementia Speech/Language:  Normal Rate Affect: Appropriate Mood: normal Thought process: normal Thought content:  WNL Sensory/Perceptual disturbances:  WNL Orientation: oriented to person, place, and situation Attention: Good Concentration: Good Memory: Impaired short term Fund of knowledge:  Good Insight:  Good Judgment:  Good Impulse Control: Good  Risk Assessment: Danger to Self: No Self-injurious Beh avior: No Danger to Others: No Duty to Warn:no Physical Aggression / Violence:No  Access to Firearms a concern: No  Gang Involvement:No  Treatment plan: Will employ cognitive behavioral therapy as well as grief and loss therapy for relief of anxiety and grief.  Goals of grief therapy or to have a healthier response to the loss of his daughter and less sadness as evidenced by patient report in therapy notes as well as to help him f eel less overwhelmed by her loss.  Goals are to see a 50% reduction in grief symptoms over the next 6 months.  I will encourage the use of the patient telling his grief story about his daughter including encouraging him to bring pictures and memorabilia to help process his feelings including any feelings of guilt associated with her loss.  We will use cognitive behavioral therapy to help identify and change anxiety producing thoughts an d behavior patterns so as to improve his ability to better manage anxiety and stress, and manage thoughts and worrisome thinking contributing to anxiety.  We will also  refresh and encourage dialectical behavior therapy distress tolerance and mindfulness skills with the intention of reducing anxiety by 50% over the next 6 months. Progress: 30% Interventions:  Cognitive Behavioral Therapy  Diagnosis:PTSD, Bi-Polar D/O  Plan: F/U/ app ointments scheduled weekly.  Cecile Coder, [facility]                                                                                                                   Cecile Coder, [facility]               Cecile Coder, [facility]               Cecile Coder, [facility]               Cecile Coder, [facility]  [place] [facility] Counselor/Therapist Progress Note  Patient ID: Conlee Sliter, MRN: [id number],    Date: 04/01/2024 This session was held via video teletherapy. The patient consented to the video  teletherapy and was located in his home during this session. He is aware it is the responsibility of the patient to secure confidentiality on his end of the session. The provider was in a private home office for the duration of this session.   The patient arrived on time for her Caregility session  Time Spent: 57 minutes, 2 PM  to 2:57 PM  Treatment Type: Individual Therapy Reported Symptoms: anxiety, panic attacks The patient is still having significant back pain.  He cannot sit anywhere for very long and has to be careful where he sits.  There is still some unsteadiness although he did not report any falls over the past 2 weeks.  His biggest concern now is for his wife.  She has been having some difficulty breathing and they did a test so they found a sma ll hole on the back side of her heart.  She is meeting with her cardiologist again next week to see what possible treatment is for her.  He still thinks there may be some issue with her diaphragm but she is meeting with the pulmonologist also.  He is doing what he can to help her.  His daughter's birthday is March 11 so he knows that is a difficult time for both he and his wife next week.  Typically they do something to remember her b ut because of the wife's current health issues they cannot go to [place] as they had planned so they are talking about what to do to remember and celebrate her birthday.  We will continue to work on coping skills for helping with his anxiety and the progression of his Lewy body dementia diagnosis as well as processing his grief. Mental Status Exam: Appearance:  Well Groomed  Behavior: Appropriate Motor: Pt. Reports some in stability due to Lewey Body dementia Speech/Language:  Normal Rate Affect: Appropriate Mood: normal Thought process: normal Thought content:  WNL Sensory/Perceptual disturbances:  WNL Orientation: oriented to person, place, and  situation Attention: Good Concentration: Good Memory: Impaired short term Fund of knowledge:  Good Insight:  Good Judgment:  Good Impulse Control: Good  Risk Assessment: Danger to Self: No S elf-injurious Behavior: No Danger to Others: No Duty to Warn:no Physical Aggression / Violence:No  Access to Firearms a concern: No  Gang Involvement:No  Treatment plan: Will employ cognitive behavioral therapy as well as grief and loss therapy for relief of anxiety and grief.  Goals of grief therapy or to have a healthier response to the loss of his daughter and less sadness as evidenced by patient report in therapy notes as well  as to help him feel less overwhelmed by her loss.  Goals are to see a 50% reduction in grief symptoms over the next 6 months.  I will encourage the use of the patient telling his grief story about his daughter including encouraging him to bring pictures and memorabilia to help process his feelings including any feelings of guilt associated with her loss.  We will use cognitive behavioral therapy to help identify and change anxiety prod ucing thoughts and behavior patterns so as to improve his ability to better manage anxiety and stress, and manage thoughts and worrisome thinking contributing to anxiety.  We will also refresh and encourage dialectical behavior therapy distress tolerance and mindfulness  skills with the intention of reducing anxiety by 50% over the next 6 months. Progress: 30% Interventions: Cognitive Behavioral Therapy  Diagnosis:PTSD, Bi-Polar D/O   Plan: F/U/ appointments scheduled weekly.  Cecile Coder, [facility]                                                                                                                   Cecile Coder,  [facility]  [place] [facility] Counselor/Therapist Progress Note  Patient ID: Gracyn Santillanes, MRN: [id number],    Date: 04/01/2024 This session was held via video teletherapy. The patient consented to the video teletherapy and was  located in his home during this session. He is aware it is the responsibility of the patient to secure confidentiality on his end of the session. The provider was in a private home office for the duration of this session.   The patient arrived on time for her Caregility session  Time Spent: 58 minutes, 2 PM to 2:58 PM  Treatment Type: Individual Therapy Reported Symptoms: anxiety, panic attacks A fairly difficult week because it  was his daughter's birthday.  He said this year was more  difficult than last year but he could not pinpoint why.  They did remember her about going out to a nice restaurant which is something her daughter always wanted to do.  He said they were able to laugh and joke about funny things that his daughter had said or done which is not something they were able to do this time last year so he saw that as healthy grieving.  He knows that th e time change threw things off a little bit for everybody and his wife still is not feeling well and his back has been bothering him.  He was not able to go to his first round of physical therapy this week because his back was bothering him more.  He has been thinking a lot more about his daughter which he feels comfortable with.  He is thankful that it is getting warmer and staying light longer so he can get back to some of the coping  skills such as fishing that he has done in the past.   We will continue to work on coping skills for helping with his anxiety and the progression of his Lewy body dementia diagnosis as well as processing his grief. 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 Speech/Language:  Normal Rate Affect: Appropriate Mood: normal Thought process:  normal Thought content:  WNL Sensory/Perceptual disturbances:  WNL Orientation: oriented to person, place, and situation Attention: Good Concentration: Good Memory: Impaired short term Fund of knowledge:  Good Insight:  Good Judgment:  Good Impulse Control: Good  Risk Assessment: Danger to Self: No Self-injurious Behavior: No Danger to Others: No Duty to Warn:no Physical Aggression / Violence:No  Access to Firearms  a concern: No  Gang Involvement:No  Treatment plan: Will employ cognitive behavioral therapy as well as grief and loss therapy for relief of anxiety and grief.  Goals of grief therapy or to have a healthier response to the loss of his  daughter and less sadness as evidenced by patient report in therapy notes as well as to help him feel less overwhelmed by her loss.  Goals are to see a 50% reduction in grief symptoms over the next 6 mo nths.  I will encourage the use of the patient telling his grief story about his daughter including encouraging him to bring pictures and memorabilia to help process his feelings including any feelings of guilt associated with her loss.  We will use cognitive behavioral therapy to help identify and change anxiety producing thoughts and behavior patterns so as to improve his ability to better manage anxiety and stress, and manage thought s and worrisome thinking contributing to anxiety.  We will also refresh and encourage dialectical behavior therapy  distress tolerance and mindfulness skills with the intention of reducing anxiety by 50% over the next 6 months. Progress: 30% Interventions: Cognitive Behavioral Therapy  Diagnosis:PTSD, Bi-Polar D/O  Plan: F/U/ appointments scheduled weekly.  Cecile Coder, [facility]                                                                                                                    Cecile Coder, [facility]               Cecile Coder, [facility]               Cecile Coder, [facility]  [place] [facility] Counselor/Therapist Progress Note  Patient ID: Shyam Dawson, MRN: [id number],    Date: 04/01/2024 This session was held via video teletherapy. The patient consented to the video teletherapy and was located in his home during th is session. He is aware it is the responsibility of the patient to secure confidentiality on his end of the session. The provider was in a private home office for the duration of this session.   The patient arrived on time for her Caregility session  Time Spent: 57 minutes, 2 PM to 2:57 PM  Treatment Type: Individual Therapy Reported Symptoms: anxiety, panic attacks The patient is still having significant back pain.  He cannot s it anywhere for very long and has to be careful where he sits.  There is still some unsteadiness although he did not report any falls over the past 2 weeks.  His biggest concern now is  for his wife.  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  She is meeting with her cardiologist again next week to see what possible treatment is for her.  He still thinks th ere may be some issue with her diaphragm but she is meeting with the pulmonologist also.  He is doing what he can to help her.  His daughter's birthday is March 11 so he knows that is a difficult time for both he and his wife next week.  Typically they do something to remember her but because of the wife's current health issues they cannot go to [place] as they had planned so they are talking about what to do to remember and cel ebrate her birthday.  We will continue to work on coping skills for helping with his anxiety and the progression of his Lewy body dementia diagnosis as well as processing his grief. 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 Speech/Language:  Normal Rate Affect: Appropriate Mood: normal Thought process: normal Thought content:  WNL Se nsory/Perceptual disturbances:  WNL Orientation: oriented to person, place, and situation Attention: Good Concentration: Good Memory: Impaired short term Fund of knowledge:  Good Insight:  Good Judgment:  Good Impulse Control: Good  Risk Assessment: Danger to Self: No Self-injurious Behavior: No Danger to Others: No Duty to Warn:no Physical Aggression / Violence:No  Access to Firearms a concern: No  Gang Involvement: No  Treatment plan: Will employ cognitive behavioral therapy as well as grief and loss therapy for relief of anxiety and grief.  Goals of grief therapy or to have a healthier response to the loss of his daughter and less sadness as evidenced by patient report in therapy notes as well as to help him feel less overwhelmed by her loss.  Goals are to see a 50% reduction  in grief symptoms over the next 6 months.  I will encourage the use of  the patient telling his grief story about his daughter including encouraging him to bring pictures and memorabilia to help process his feelings including any feelings of guilt associated with her loss.  We will use cognitive behavioral therapy to help identify and change anxiety producing thoughts and behavior patterns so as to improve his ability to better manage anxiety and stress, and manage thoughts and worrisome thinking contribut ing to anxiety.  We will also refresh and encourage dialectical behavior therapy distress tolerance and mindfulness  skills with the intention of reducing anxiety by 50% over the next 6 months. Progress: 30% Interventions: Cognitive Behavioral Therapy  Diagnosis:PTSD, Bi-Polar D/O  Plan: F/U/ appointments scheduled weekly.  Cecile Coder, [facility]                                                                                                                    Cecile Coder, [facility]  [facility] [facility] Counselor/Therapist Progress Note  Patient ID: Freedom Peddy, MRN: [id number],    Date: 04/01/2024 This session was held via video teletherapy. The patient consented to the video teletherapy and was located in his home during this session. He is aware it is the responsibility of the patient to secure confidentiality on his end of the session. The provi der was in a private home office for the duration of this session.   The patient arrived on time for her Caregility session  Time Spent: 57 minutes, 2 PM to 2:57 PM  Treatment Type: Individual Therapy Reported Symptoms: anxiety, panic attacks  We will continue to work on coping skills for helping with his anxiety and the progression of his Lewy body dementia diagnosis as well as processing his grief. Mental Status Exam: Appea rance:  Well Groomed  Behavior: Appropriate Motor: Pt. Reports some instability due to Lewey Body dementia Speech/Language:  Normal Rate Affect: Appropriate Mood: normal Thought process: normal Thought content:  WNL Sensory/Perceptual disturbances:  WNL Orientation: oriented to person, place, and situation Attention: Good Concentration: Good Memory: Impaired short term Fund of knowledge:  Good Insight:  Good Judgment :  Good Impulse Control: Good  Risk Assessment: Danger to Self: No Self-injurious Behavior: No Danger to Others: No Duty to Warn:no Physical Aggression / Violence:No  Access  to Firearms a concern: No  Gang Involvement:No  Treatment plan: Will employ cognitive behavioral therapy as well as grief and loss therapy for relief of anxiety and grief.  Goals of grief therapy or to have a healthier response to the loss of his daughte r and less sadness as evidenced by patient report in therapy notes as well as to help him feel less overwhelmed by her loss.  Goals are to see a 50% reduction in grief symptoms over the next 6 months.  I will encourage the use of the patient telling his grief story about his daughter including encouraging him to bring pictures and memorabilia to help process his feelings including any feelings of guilt associated with her loss.  We will  use cognitive behavioral therapy to help identify and change anxiety producing thoughts and behavior patterns so as to improve his ability to better manage anxiety and stress, and manage thoughts and worrisome thinking contributing to anxiety.  We will also refresh and encourage dialectical behavior therapy distress tolerance and mindfulness skills with the intention of reducing anxiety by 50% over the next 6 months. Progress: 30% In terventions: Cognitive Behavioral Therapy  Diagnosis:PTSD, Bi-Polar D/O  Plan: F/U/ appointments scheduled weekly.  Cecile Coder, [facility]                                                                                                                   Cecile Coder, [facility]               Cecile Coder,  [facility]               Cecile Coder, [facility]  Cecile Coder, [facility]               Cecile Coder, [facility]               Cecile Coder, [facility]               Cecile Coder,  [facility]  [facility] Counselor/Therapist Progress Note  Patient ID: Montrae Braithwaite, MRN: [id number],    Date: 04/01/2024 This session was held via video teletherapy. The patient consented to the video teletherapy and was located in his home during this session. He is aware it is the respons ibility of the patient to secure confidentiality on his end of the session. The provider was in a private home office for the duration of this session.   The patient arrived on time for her Caregility session  Time Spent: 39 minutes, 2 PM to 2:39 PM  Treatment Type: Individual Therapy Reported Symptoms: anxiety, panic attacks The patient was not feeling well today.  He started feeling really earlier in the week having a fever ba  d headaches body aches.  He felt a little better yesterday but feels a little bit of a resurgence in headaches and body aches and is running a low-grade fever.  His wife is feeling some of the same symptoms.  For the most part he says he is doing well.  They did increase his memory medication because they were starting to see some increasing decline in short-term memory and he said until recently his long-term memory had been good but h e was starting to see a difference in how he used to markers to remember what happened at certain times historically.  He feels the medication has helped and has brightened his mood a little also.  He is still trying to walk every day and fish when he can and has enjoyed watching the [employer] as a good distraction.  He reports that he feels he is managing mood fairly well.  He has an appointment in 2 weeks.  He does con tract for safety having no thoughts of hurting himself or anyone else. We will continue to work on coping skills for helping with his anxiety and the progression of his Lewy body dementia diagnosis as well as processing his grief. 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 Speech/Language:  Normal Rate Affect: Appropriate Mood: normal Tho ught process: normal Thought content:  WNL Sensory/Perceptual disturbances:  WNL Orientation: oriented to person, place, and situation Attention: Good Concentration: Good Memory: Impaired short term Fund of knowledge:  Good Insight:  Good Judgment:  Good Impulse Control: Good  Risk Assessment: Danger to Self: No Self-injurious Behavior: No Danger to Others: No Duty to Warn:no Physical Aggression / Violence:No  Acce ss to Firearms a concern: No  Gang Involvement:No  Treatment plan: Will employ cognitive behavioral therapy as well as grief and loss therapy for relief of anxiety and grief.  Goals  of grief therapy or to have a healthier response to the loss of his daughter and less sadness as evidenced by patient report in therapy notes as well as to help him feel less overwhelmed by her loss.  Goals are to see a 50% reduction in grief symptoms over  the next 6 months.  I will encourage the use of the patient telling his grief story about his daughter including encouraging him to bring pictures and memorabilia to help process his feelings including any feelings of guilt associated with her loss.  We will use cognitive behavioral therapy to help identify and change anxiety producing thoughts and behavior patterns so as to improve his ability to better manage anxiety and stress, and  manage thoughts and worrisome thinking contributing to anxiety.  We will also refresh  and encourage dialectical behavior therapy distress tolerance and mindfulness skills with the intention of reducing anxiety by 50% over the next 6 months. Progress: 30% Interventions: Cognitive Behavioral Therapy  Diagnosis:PTSD, Bi-Polar D/O  Plan: F/U/ appointments scheduled weekly.  Cecile Coder, [facility]                                                                                                                    Cecile Coder, [facility]  [place] [facility] Counselor/Therapist Progress Note  Patient ID: Lalo Tromp, MRN: [id number],    Date: 04/01/2024 This session was held via video teletherapy. The patient consented to the video teletherapy and was located in his home during this session. He is aware it is the responsibility of the patient to secure conf identiality on his end of the session. The provider was in a private home office for the duration of this session.   The patient arrived on time for her Caregility session  Time Spent: 58 minutes, 2 PM to 2:58 PM  Treatment Type: Individual Therapy Reported Symptoms: anxiety, panic attacks A fairly difficult week because it was his daughter's birthday.  He said this year was more difficult than last year but he could not pinpoin t why.  They did remember her about going out to a nice restaurant which is something her daughter always wanted to do.  He said they were able to laugh and joke about funny things that  his daughter had said or done which is not something they were able to do this time last year so he saw that as healthy grieving.  He knows that the time change threw things off a little bit for everybody and his wife still is not feeling well and his ba ck has been bothering him.  He was not able to go to his first round of physical therapy this week because his back was bothering him more.  He has been thinking a lot more about his daughter which he feels comfortable with.  He is thankful that it is getting warmer and staying light longer so he can get back to some of the coping skills such as fishing that he has done in the past.   We will continue to work on coping skills for hel ping with his anxiety and the progression of his Lewy body dementia diagnosis as well as processing his grief. 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 Speech/Language:  Normal Rate Affect: Appropriate Mood: normal Thought process: normal Thought content:  WNL Sensory/Perceptual disturbances:  WNL Orientation: oriented to person, pl ace, and situation Attention: Good Concentration: Good Memory: Impaired short term Fund of knowledge:  Good Insight:  Good Judgment:  Good Impulse Control: Good  Risk Assessment: Danger to Self: No Self-injurious Behavior: No Danger to Others: No Duty to Warn:no Physical Aggression / Violence:No  Access to Firearms a concern: No  Gang Involvement:No  Treatment plan: Will employ cognitive behavioral therapy as well as  grief and loss therapy for relief of anxiety and grief.  Goals of grief therapy or to have a healthier response to the loss of his daughter and less sadness as evidenced by patient report in therapy notes as well as to help him feel less overwhelmed by her loss.  Goals are to see a 50% reduction in grief symptoms over the next 6 months.  I will encourage the use of the  patient telling his grief story about his daughter including encoura ging him to bring pictures and memorabilia to help process his feelings including any feelings of guilt associated with her loss.  We will use cognitive behavioral therapy to help identify and change anxiety producing thoughts and behavior patterns so as to improve his ability to better manage anxiety and stress, and manage thoughts and worrisome thinking contributing to anxiety.  We will also refresh and encourage dialectical behavior  therapy  distress tolerance and mindfulness skills with the intention of reducing anxiety by 50% over the next 6 months. Progress: 30% Interventions: Cognitive Behavioral Therapy  Diagnosis:PTSD, Bi-Polar D/O  Plan: F/U/ appointments scheduled weekly.  Cecile Coder, [facility]                                                                                                                    Cecile Coder, [facility]               Cecile Coder, [facility]               Cecile Coder, [facility]  [place] [facility] Counselor/Therapist Progress Note  Patient ID: Olis Viverette, MRN: [id number],    Date: 04/01/2024 This session was held via video teletherapy. The patient consented to the video teletherapy and was located in his home during this session. He is aware it is the responsibility of the patient to secure confidentiality on his end of the session. Th e provider was in a private home office for the duration of this session.   The patient arrived on time for her Caregility session  Time Spent: 1:05 PM until 2 PM, 55 minutes Treatment Type: Individual Therapy Reported Symptoms: anxiety, panic attacks The patient said he came in from his walk this morning and saw what he thought was some sort of tic from his wife.  It was not the first time it happened and he wonders if it might  be tardive dyskinesia because of some new medications that she is on.  She has a call into the doctor right now and hopes to hear something back soon.  They have narrowed down to the fact they think most of her discomfort in breathing is coming from the diaphragm and they are looking for alternatives for treatment.  The patient continues to do those things which help him cope including walking, fishing when he can, spending time with  his son and daughter-in-law and grandson.  He also has a way of continued coping with grief he is writing letters to his daughters as a way of expressing his feelings and says that is  very beneficial.  He appears to be grieving in a healthy way but he knows that he to year anniversary of her death is coming up and there is some grief there but says it is not overwhelming.  We will continue to work on coping skills for helping with hi s anxiety and the progression of his Lewy body dementia diagnosis as well as processing his grief. 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 Speech/Language:  Normal Rate Affect: Appropriate Mood: normal Thought process: normal Thought content:  WNL Sensory/Perceptual disturbances:  WNL Orientation: oriented to person, place, and sit uation Attention: Good Concentration: Good Memory: Impaired short term Fund of knowledge:  Good Insight:  Good Judgment:  Good Impulse Control: Good  Risk Assessment: Danger to Self: No Self-injurious Behavior: No Danger to Others: No Duty to Warn:no Physical Aggression / Violence:No  Access to Firearms a concern: No  Gang Involvement:No  Treatment plan: Will employ cognitive behavioral therapy as well as grief and lo ss therapy for relief of anxiety and grief.  Goals of grief therapy or to have a healthier response to the loss of his daughter and less sadness as evidenced by patient report in therapy notes as well as to help him feel less overwhelmed by her loss.  Goals are to see a 50% reduction in grief symptoms over the next 6 months.  I will encourage the use of the patient telling his grief story about his daughter including encouraging him to  bring pictures and memorabilia to help process his feelings including any feelings of guilt associated with her loss.  We will use cognitive behavioral therapy to help identify and change anxiety producing thoughts and behavior patterns so as to improve his ability to better manage anxiety and stress, and manage thoughts and worrisome thinking contributing to anxiety.   We will also refresh and encourage dialectical behavior therapy dist ress tolerance  and mindfulness skills with the intention of reducing anxiety by 50% over the next 6 months. Progress: 30% Interventions: Cognitive Behavioral Therapy  Diagnosis:PTSD, Bi-Polar D/O  Plan: F/U/ appointments scheduled weekly.  Cecile Coder, [facility]                                                                                                                    Cecile Coder, [facility]  [place] [facility] Counse lor/Therapist Progress Note  Patient ID: Woods Gangemi, MRN: [id number],    Date: 04/01/2024 This session  was held via video teletherapy. The patient consented to the video teletherapy and was located in his home during this session. He is aware it is the responsibility of the patient to secure confidentiality on his end of the session. The provider was in a private home office for the duration of this session.   The patient a rrived on time for her Caregility session  Time Spent: 59 minutes, 2 PM to 2:59 PM  Treatment Type: Individual Therapy Reported Symptoms: anxiety, panic attacks The patient continues to present with physical pain and both his back and his knee.  He started with a new physical therapist and told him that there was a lot of arthritis in both knees and or certain things he cannot do but they had him do that anyway and now for the past  3 days he has been in significant knee pain.  He has continued to walk because he knows that is good for him in various ways but says it has been painful.  His wife also was not feeling well but she is going back to the doctor next week to have an ablation and hopes that will bring her some relief.  For the most part he has been at the last couple weeks reaching back out and hearing from some old friends as well as time with his son an d son's family.  He says that his wife's cousin is coming down for a weekend soon and they enjoy time with her and that he will go to his brother's house sometime in the next few weeks.  He recognizes the need for people that he cares about in helping with his depression. We will continue to work on coping skills for helping with his anxiety and the progression of his Lewy body dementia diagnosis as well as processing his grief. Menta l Status Exam: Appearance:  Well Groomed  Behavior: Appropriate Motor: Pt. Reports some instability due to Lewey Body dementia Speech/Language:  Normal Rate Affect: Appropriate Mood: normal Thought process: normal Thought content:  WNL Sensory/Perceptual disturbances:   WNL Orientation: oriented to person, place, and situation Attention: Good Concentration: Good Memory: Impaired short term Fund of knowledge:  Good Insi ght:  Good Judgment:  Good Impulse Control: Good  Risk Assessment: Danger to Self: No Self-injurious Behavior: No Danger to Others: No Duty to Warn:no Physical Aggression / Violence:No  Access to Firearms a concern: No  Gang Involvement:No  Treatment plan: Will employ cognitive behavioral therapy as well as grief and loss therapy for relief of anxiety and grief.  Goals of grief therapy or to have a healthier response to th e loss of his daughter and less sadness as evidenced by patient report in therapy notes as well as to help him feel less overwhelmed by her loss.  Goals are to see a 50% reduction in grief symptoms over the next 6 months.  I will encourage the use of the patient telling his grief story about his daughter including encouraging him to bring pictures and memorabilia to help process his feelings including any feelings of guilt associated wi th her loss.  We will use cognitive behavioral therapy to help identify and change anxiety producing thoughts and behavior patterns so as to improve his ability to better manage anxiety and stress, and manage thoughts and worrisome thinking contributing to anxiety.  We will  also refresh and encourage dialectical behavior therapy distress tolerance and mindfulness skills with the intention of reducing anxiety by 50% over the next 6 month s. Progress: 30% Interventions: Cognitive Behavioral Therapy  Diagnosis:PTSD, Bi-Polar D/O  Plan: F/U/ appointments scheduled weekly.  Cecile Coder, [facility]                                                                                                                   Cecile Coder, [facility]                Cecile Coder,  [facility]               Cecile Coder, [facility]               Cecile Coder, [facility]  [place] [facility] Counselor/Therapist Progress Note  Patient ID: Ivo Moga, MRN: [id number],    Date: 04/01/2024 This session was held via video teletherapy. The patient consented to the video teletherapy and was located in his home during this session. He is aware it is the responsibility of the patient to secure confidentiality on his end of the session. The provider was in a private home office for the durati on of this session.   The patient arrived on time for her Caregility session  Time Spent: 57 minutes, 2 PM to 2:57 PM  Treatment Type: Individual Therapy Reported Symptoms: anxiety, panic  attacks The patient is still having significant back pain.  He cannot sit anywhere for very long and has to be careful where he sits.  There is still some unsteadiness although he did not report any falls over the past 2 weeks.  His biggest con cern now is for his wife.  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  She is meeting with her cardiologist again next week to see what possible treatment is for her.  He still thinks there may be some issue with her diaphragm but she is meeting with the pulmonologist also.  He is doing what he can to help her.  His daughter's birthday is March 11 so he k nows that is a difficult time for both he and his wife next week.  Typically they do something to remember her but because of the wife's current health issues they cannot go to [place] as they had planned so they are talking about what to do to remember and celebrate her birthday.  We will continue to work on coping skills for helping with his anxiety and the progression of his Lewy body dementia diagnosis as well as processing  his grief. 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 Speech/Language:  Normal Rate Affect: Appropriate Mood: normal Thought process: normal Thought content:  WNL Sensory/Perceptual disturbances:  WNL Orientation: oriented to person, place, and situation Attention: Good Concentration: Good Memory: Impaired short term Fund of knowl edge:  Good Insight:  Good Judgment:  Good Impulse Control: Good  Risk Assessment: Danger to Self: No Self-injurious Behavior: No Danger to Others: No Duty to Warn:no Physical Aggression / Violence:No  Access to Firearms a concern: No  Gang Involvement:No  Treatment plan: Will employ cognitive behavioral therapy as well as grief and loss therapy for relief of  anxiety and grief.  Goals of grief therapy or to have a healthi er response to the loss of his daughter and less sadness as evidenced by patient report in therapy notes as well as to help him feel less overwhelmed by her loss.  Goals are to see a 50% reduction in grief symptoms over the next 6 months.  I will encourage the use of the patient telling his grief story about his daughter including encouraging him to bring pictures and memorabilia to help process his feelings including any feelings of gu ilt associated with her loss.  We will use cognitive behavioral therapy to help identify and change anxiety producing thoughts and behavior patterns so as to improve his ability to better manage anxiety and stress, and manage thoughts and worrisome thinking contributing to anxiety.  We will also refresh and encourage dialectical behavior therapy distress tolerance and mindfulness skills  with the intention of reducing anxiety by 50% over  the next 6 months. Progress: 30% Interventions: Cognitive Behavioral Therapy  Diagnosis:PTSD, Bi-Polar D/O  Plan: F/U/ appointments scheduled weekly.  Cecile Coder, [facility]                                                                                                                   Cecile Coder, [facility]  [place] [facility] Counselor/Therapist Progress Note  Patient ID: Rip Hawes, MRN: [id number],    Date:  04/01/2024 This session was held via video teletherapy. The patient consented to the video teletherapy and was located in his home during this session. He is aware it is the responsibility of the patient to secure confidentiality on his end of the session. The provider was in a private home office for the duration of this session.   The patient arrived on time for her Caregility session  Time Spent: 58 minutes, 2 PM to 2:58 PM  T reatment Type: Individual Therapy Reported Symptoms: anxiety, panic attacks A fairly difficult week because it was his daughter's birthday.  He said this year was more difficult than last year but he could not pinpoint why.  They did remember her about going out to a nice restaurant which is something her daughter always wanted to do.  He said they were able to laugh and joke about funny things that his daughter had said or done which  is not something they were able to do this time last year so he saw that as healthy grieving.  He knows that the time change threw things off a little bit for everybody and his wife  still is not feeling well and his back has been bothering him.  He was not able to go to his first round of physical therapy this week because his back was bothering him more.  He has been thinking a lot more about his daughter which he feels comfortable wi th.  He is thankful that it is getting warmer and staying light longer so he can get back to some of the coping skills such as fishing that he has done in the past.   We will continue to work on coping skills for helping with his anxiety and the progression of his Lewy body dementia diagnosis as well as processing his grief. Mental Status Exam: Appearance:  Well Groomed  Behavior: Appropriate Motor: Pt. Reports some instability  due to Lewey Body dementia Speech/Language:  Normal Rate Affect: Appropriate Mood: normal Thought process: normal Thought content:  WNL Sensory/Perceptual disturbances:  WNL Orientation: oriented to person, place, and situation Attention: Good Concentration: Good Memory: Impaired short term Fund of knowledge:  Good Insight:  Good Judgment:  Good Impulse Control: Good  Risk Assessment: Danger to Self: No Self-injur ious Behavior: No Danger to Others: No Duty to Warn:no Physical Aggression / Violence:No  Access to Firearms a concern: No  Gang Involvement:No  Treatment plan: Will employ cognitive behavioral therapy as well as grief and loss therapy for relief of anxiety and grief.  Goals of grief therapy or to have a healthier response to the loss of his daughter and less sadness as evidenced by patient report in therapy notes as well as to he lp him feel less overwhelmed by her loss.  Goals are to see a 50% reduction in grief symptoms over the next 6 months.  I will encourage the use of the patient telling his grief story about his daughter including encouraging him to bring pictures and memorabilia to help process his feelings including any feelings of guilt associated with her loss.  We will use  cognitive behavioral therapy to help identify and change anxiety producing tho ughts and behavior patterns so as to improve his ability to better manage anxiety and stress, and manage thoughts and worrisome thinking contributing to anxiety.  We will also refresh and encourage dialectical behavior therapy  distress tolerance and mindfulness skills with the intention of reducing anxiety by 50% over the next 6 months. Progress: 30% Interventions: Cognitive Behavioral Therapy  Diagnosis:PTSD, Bi-Polar D/O  Plan:  F/U/ appointments scheduled weekly.  Cecile Coder, [facility]                                                                                                                   Cecile Coder, [facility]               Cecile Coder, [facility]               Cecile Coder, [facility]  [place] [facility] Counselor/Therapist Progress Note  Patient ID: Chee Kinslow, MRN: [id number],    Date: 04/01/2024 This session was  held via video teletherapy. The patient consented to the video teletherapy and was located in his home during this session. He is aware it is the responsibility of the patient to secure confidentiality on his end of the session. The provider was in a private home office for the duration of this session.   The patient arrived on time for her Caregility session  Time Spent: 57 minutes, 2 PM to 2:57 PM  Treatment Type: Individual The rapy Reported Symptoms: anxiety, panic attacks The patient is still having significant back pain.  He cannot sit anywhere for very long and has to be careful where he sits.  There is still some unsteadiness although he did not report any falls over the past 2 weeks.  His biggest concern now is for his wife.  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  She  is meeting with her cardiologist again next week to see what possible treatment is for her.  He still thinks there may be some issue with her diaphragm but she is meeting with the pulmonologist also.  He is doing what he can to help her.  His daughter's birthday is March 11 so he knows that is a difficult time for both he and his wife next week.  Typically they do something to remember her but because of the wife's current health iss ues they cannot go to [place] as they had planned so they are talking about what to do to remember and celebrate her birthday.  We will continue to work on coping skills for  helping with his anxiety and the progression of his Lewy body dementia diagnosis as well as processing his grief. 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 Speec h/Language:  Normal Rate Affect: Appropriate Mood: normal Thought process: normal Thought content:  WNL Sensory/Perceptual disturbances:  WNL Orientation: oriented to person, place, and situation Attention: Good Concentration: Good Memory: Impaired short term Fund of knowledge:  Good Insight:  Good Judgment:  Good Impulse Control: Good  Risk Assessment: Danger to Self: No Self-injurious Behavior: No Danger to Other s: No Duty to Warn:no Physical Aggression / Violence:No  Access to Firearms a concern: No  Gang Involvement:No  Treatment plan: Will employ cognitive behavioral therapy as well as grief and loss therapy for relief of anxiety and grief.  Goals of grief therapy or to have a healthier response to the loss of his daughter and less sadness as evidenced by patient report in therapy notes as well as to help him feel less overwhelmed by he r loss.  Goals are to see a 50% reduction in grief symptoms over the next 6 months.  I will encourage the use of the patient telling his grief story about his daughter including encouraging him to bring pictures and memorabilia to help process his feelings including any feelings of guilt associated with her loss.  We will use cognitive behavioral therapy to help identify and change anxiety producing thoughts and behavior patterns so as  to improve his ability to better manage anxiety and stress, and manage thoughts and worrisome thinking contributing to anxiety.  We will also refresh and encourage dialectical behavior therapy distress tolerance and mindfulness skills  with the intention of reducing anxiety by 50% over the next 6 months. Progress: 30% Interventions: Cognitive Behavioral  Therapy  Diagnosis:PTSD, Bi-Polar D/O  Plan: F/U/ appointments scheduled weekly .  Cecile Coder, [facility]                                                                                                                   Cecile Coder, [facility]  [place] [facility] Counselor/Therapist Progress Note  Patient ID: Devereaux Grayson, MRN: [id number],    Date: 04/01/2024 This session was held via video teletherapy. The patient consented to the video teletherapy and was located in his home during this session. He  is aware it is the responsibility of the patient to secure confidentiality on his end of the  session. The provider was in a private home office for the duration of this session.   The patient arrived on time for her Caregility session  Time Spent: 57 minutes, 2 PM to 2:57 PM  Treatment Type: Individual Therapy Reported Symptoms: anxiety, panic attacks  The patient reported that he had not been sleeping well so he did speak wit h his psychiatrist about adjusting some meds.  Initially they adjusted too much up and he said he felt groggy for the entire next day but they have found a combination now where he feels like he is getting more rested and only feels a little drowsy shortly after waking up..  There have been a couple times where he stumbled and one of the times he felt down but did not get hurt the other times he was able to catch himself.  He is trying  to stay as active as he can walking daily and fishing with his wife when he can.  His biggest anxiety now is his wife has an upcoming surgery on her diaphragm in 2 weeks so he knows that will be a fairly significant recovery time but also sees the difficulties she is having now and wants her to get some relief..  His son and daughter-in-law will be supports for them also.  He continues to speak with friends and family members as encoura gement.  He also feels that he is grieving appropriately writing to his daughter on a regular basis about the things that are going on in his and his wife's lives.  We will continue to work on coping skills for helping with his anxiety and the progression of his Lewy body dementia diagnosis as well as processing his grief. Mental Status Exam: Appearance:  Well Groomed  Behavior: Appropriate Motor: Pt. Reports some instability du e to [employer] dementia Speech/Language:  Normal Rate Affect: Appropriate Mood: normal Thought process: normal Thought content:  WNL Sensory/Perceptual disturbances:  WNL Orientation: oriented to person, place, and  situation Attention: Good Concentration: Good Memory: Impaired short term Fund of knowledge:  Good Insight:  Good Judgment:  Good Impulse Control: Good  Risk Assessment: Danger to Self: No Self-injuriou s Behavior: No Danger to Others: No Duty to Warn:no Physical Aggression / Violence:No  Access to Firearms a concern: No  Gang Involvement:No  Treatment plan: Will employ cognitive behavioral therapy as well as grief and loss therapy for relief of anxiety and grief.  Goals of grief therapy or to have a healthier response to the loss of his daughter and less sadness as evidenced by patient report in therapy notes as well as to help  him feel less overwhelmed by her loss.  Goals are to see a 50% reduction in grief symptoms over the next 6 months.  I will encourage the use of the patient telling his grief story about his daughter including encouraging him to bring pictures and memorabilia to help process his feelings including any feelings of guilt associated with her loss.  We will use cognitive behavioral therapy to help identify and change anxiety producing though ts and behavior patterns so as to improve his ability  to better manage anxiety and stress, and manage thoughts and worrisome thinking contributing to anxiety.  We will also refresh and encourage dialectical behavior therapy distress tolerance and mindfulness skills with the intention of reducing anxiety by 50% over the next 6 months. Progress: 30% Interventions: Cognitive Behavioral Therapy  Diagnosis:PTSD, Bi-Polar D/O  Plan: F/U / appointments scheduled weekly.  Cecile Coder, [facility]                                                                                                                   Cecile Coder, [facility]               Cecile Coder, [facility]               Cecile Coder,  [facility]                Cecile Coder, [facility]               Cecile Coder, [facility]               Cecile Coder, [facility]               Cecile Coder, [facility]               Cecile Coder, [facility]  [place] [facility] Counselor/Therapist Progress Note  Patient ID: Eisa Necaise, MRN: [id number],    Date: 04/01/2024 This session was held via video teletherapy. The patient consented to the video teletherapy and was located in his home during this session. He is aware it is the responsibility of the patient to secure co nfidentiality on his end of the session. The provider was in a private home office for the duration of this session.   The patient arrived on time  for her Caregility session  Time Spent: 59 minutes, 2 PM to 2:59 PM  Treatment Type: Individual Therapy Reported Symptoms: anxiety, panic attacks The patient said he jokingly said to his wife that he had not had any pain issues in the cervical area and then a few days ago he started h aving pain in his neck and shoulders.  He now sits related to degenerative disk and has some consistent pain somewhere in his spine with that but jokingly says it just depends on Wednesday which part of his body is decides to show up and hurt.  He is doing the best that he can.  He is still walking which he knows is good for him but does not have right now the strength to do as much in the morning as he has done in the past so he is den ying that to 3 times a day for consistency.  He is still doing some positive things and that he is staying in touch with his brother and other family members.  He is reconnecting with friends from high school and he is which she is enjoying.  One of them has had a similar experience in that she lost her son in the same way the patient lost his daughter.  He said they did not think and Easter in which they set a place for his daughter at  the table and put her picture there.  He initially had reservations about doing that he and his wife had made everyone feel as if she was really there with him.  He still feels that he is grieving in a healthy way.  He and his wife have always taken a trip around both of their birthdays which are 2 days apart.  That is now close to his daughter died so they have started taking some of her rashes with him and sprinkling them where they  go as a way of remembrance.  They are deciding what that looks like this year in July.  He does contract for safety having no thoughts of hurting himself or anyone else. We will continue to work on coping skills for helping with his anxiety and the progression of his Lewy body dementia diagnosis as well as processing his  grief. Mental Status Exam: Appearance:  Well Groomed  Behavior: Appropriate Motor: Pt. Reports some instabil ity due to Lewey Body dementia Speech/Language:  Normal Rate Affect: Appropriate Mood: normal Thought process: normal Thought content:  WNL Sensory/Perceptual disturbances:  WNL Orientation: oriented to person, place, and situation Attention: Good Concentration: Good Memory: Impaired short term Fund of knowledge:  Good Insight:  Good Judgment:  Good Impulse Control: Good  Risk Assessment: Danger to Self: No Self-in jurious Behavior: No Danger to Others: No Duty to Warn:no Physical Aggression / Violence:No  Access to Firearms a concern: No  Gang Involvement:No  Treatment plan: Will employ cognitive behavioral therapy as well as grief and loss therapy for relief of anxiety and grief.  Goals of grief therapy  or to have a healthier response to the loss of his daughter and less sadness as evidenced by patient report in therapy notes as well as to  help him feel less overwhelmed by her loss.  Goals are to see a 50% reduction in grief symptoms over the next 6 months.  I will encourage the use of the patient telling his grief story about his daughter including encouraging him to bring pictures and memorabilia to help process his feelings including any feelings of guilt associated with her loss.  We will use cognitive behavioral therapy to help identify and change anxiety producing  thoughts and behavior patterns so as to improve his ability to better manage anxiety and stress, and manage thoughts and worrisome thinking contributing to anxiety.  We will also refresh and encourage dialectical behavior therapy distress tolerance and mindfulness skills with the intention of reducing anxiety by 50% over the next 6 months. Progress: 30% Interventions: Cognitive Behavioral Therapy  Diagnosis:PTSD, Bi-Polar D/O  Pla n: F/U/ appointments scheduled weekly.  Cecile Coder,  [facility]                                                                                                                   Cecile Coder, [facility]  [place] [facility] Counselor/Therapist Progress Note  Patient ID: Naol Ontiveros, MRN: [id number],    Date: 04/01/2024 This session was held via video teletherapy. The patient consented to the video teletherapy and was locate d in his home during this session. He is aware it is the responsibility of the patient to secure confidentiality on his end of the session. The provider was in a private home office for the duration of this session.   The patient arrived on  time for her Caregility session  Time Spent: 58 minutes, 2 PM to 2:58 PM  Treatment Type: Individual Therapy Reported Symptoms: anxiety, panic attacks A fairly difficult week because it was h is daughter's birthday.  He said this year was more difficult than last year but he could not pinpoint why.  They did remember her about going out to a nice restaurant which is something her daughter always wanted to do.  He said they were able to laugh and joke about funny things that his daughter had said or done which is not something they were able to do this time last year so he saw that as healthy grieving.  He knows that the time  change threw things off a little bit for everybody and his wife still is not feeling well and his back has been bothering him.  He was not able to go to his first round of physical therapy this week because his back was bothering him more.  He has been thinking a lot more about his daughter which he feels comfortable with.  He is thankful that it is getting warmer and staying light longer so he can get back to some of the coping skills  such as fishing that he has done in the past.   We will continue to work on coping skills for helping with his anxiety and the progression of his Lewy body dementia diagnosis as well as processing his grief. 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 Speech/Language:  Normal Rate Affect: Appropriate Mood: normal Thought process: normal  Thought content:  WNL Sensory/Perceptual disturbances:  WNL Orientation: oriented to person, place, and situation Attention: Good Concentration: Good Memory: Impaired short term Fund of knowledge:  Good Insight:  Good Judgment:  Good Impulse Control: Good  Risk Assessment: Danger to Self: No Self-injurious Behavior: No Danger to Others: No Duty to Warn:no Physical Aggression / Violence:No  Access to  Firearms a con cern: No  Gang Involvement:No  Treatment plan: Will employ cognitive behavioral therapy as well as grief and loss therapy for relief of anxiety and grief.  Goals of grief therapy or to have a healthier response to the loss of his daughter and less sadness as evidenced by patient report in therapy notes as well as to help him feel less overwhelmed by her loss.  Goals are to see a 50% reduction in grief symptoms over the next 6 months.   I will encourage the use of the patient telling his grief story about his daughter including encouraging him to bring pictures and memorabilia to help process his feelings including any feelings of guilt associated with her loss.  We will use cognitive behavioral therapy to help identify and change anxiety producing thoughts and behavior patterns so as to improve his ability to better manage anxiety and stress, and manage thoughts and  worrisome thinking contributing to anxiety.  We will also refresh and encourage dialectical behavior therapy  distress tolerance and mindfulness skills with the intention of reducing anxiety by 50% over the next 6 months. Progress: 30% Interventions: Cognitive Behavioral Therapy  Diagnosis:PTSD, Bi-Polar D/O  Plan: F/U/ appointments scheduled weekly.  Cecile Coder, [facility]                                                                                                                    Cecile Coder, [facility]               Cecile Coder, [facility]               Cecile Coder, [facility]  [facility] Counselor/Therapist Progress Note  Patient ID: Benoit Meech, MRN: [id number],    Date: 04/01/2024 This session was held via video teletherapy. The patient consented to the video teletherapy and was located in his home during this session. He is  aware it is the responsibility of the patient to secure confidentiality on his end of the session. The provider was in a private home office for the duration of this session.   The patient arrived on time for her Caregility session  Time Spent: 1:05 PM until 2 PM, 55 minutes Treatment Type: Individual Therapy Reported Symptoms: anxiety, panic attacks The patient said he came in from his walk this morning and saw what he thought  was some sort of tic from his wife.  It was not the first time it happened and he wonders if it might be tardive dyskinesia because of some new medications that she is on.  She has a  call into the doctor right now and hopes to hear something back soon.  They have narrowed down to the fact they think most of her discomfort in breathing is coming from the diaphragm and they are looking for alternatives for treatment.  The patient contin ues to do those things which help him cope including walking, fishing when he can, spending time with his son and daughter-in-law and grandson.  He also has a way of continued coping with grief he is writing letters to his daughters as a way of expressing his feelings and says that is very beneficial.  He appears to be grieving in a healthy way but he knows that he to year anniversary of her death is coming up and there is some grief th ere but says it is not overwhelming.  We will continue to work on coping skills for helping with his anxiety and the progression of his Lewy body dementia diagnosis as well as processing his grief. 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 Speech/Language:  Normal Rate Affect: Appropriate Mood: normal Thought process: normal Thought co ntent:  WNL Sensory/Perceptual disturbances:  WNL Orientation: oriented to person, place, and situation Attention: Good Concentration: Good Memory: Impaired short term Fund of knowledge:  Good Insight:  Good Judgment:  Good Impulse Control: Good  Risk Assessment: Danger to Self: No Self-injurious Behavior: No Danger to Others: No Duty to Warn:no Physical Aggression / Violence:No  Access to Firearms a concern: No  G ang Involvement:No  Treatment plan: Will employ cognitive behavioral therapy as well as grief and loss therapy for relief of anxiety and grief.  Goals of grief therapy or to have a healthier response to the loss of his daughter and less sadness as evidenced by patient report in therapy notes as well as to help him feel less overwhelmed by her loss.  Goals are to  see a 50% reduction in grief symptoms over the next 6 months.  I will enco urage the use of the patient telling his grief story about his daughter including encouraging him to bring pictures and memorabilia to help process his feelings including any feelings of guilt associated with her loss.  We will use cognitive behavioral therapy to help identify and change anxiety producing thoughts and behavior patterns so as to improve his ability to better manage anxiety and stress, and manage thoughts and worrisome th inking contributing to anxiety.  We will also refresh and encourage dialectical behavior therapy distress tolerance  and mindfulness skills with the intention of reducing anxiety by 50% over the next 6 months. Progress: 30% Interventions: Cognitive Behavioral Therapy  Diagnosis:PTSD, Bi-Polar D/O  Plan: F/U/ appointments scheduled weekly.  Cecile Coder, [facility]                                                                                                                    Cecile Coder, [facility]  [facility] Counselor/Therapist Progress Note  Patient ID: Lindon Kiel, MRN: [id number],    Date: 04/01/2024 This session was held via video teletherapy. The patient consented to the video teletherapy and was located in his home during this session. He is aware it is the responsibility of the patient to secure confidentiality on his end of the se ssion. The provider was in a private home office for the duration of this session.   The patient arrived on time for her Caregility session  Time Spent: 59 minutes, 2 PM to 2:59 PM  Treatment Type: Individual Therapy Reported Symptoms: anxiety, panic attacks The patient continues to present with physical pain and both his back and his knee.  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  He has continued to walk because he knows that is good for him in various ways but says it has been painful.  His wife also was not feeling well but she is going back to the doctor next week to have an ablation and hopes that will bring her some relief.  For the most part he has been at the  last couple weeks reaching back out and hearing from some old friends as well as time with his son and son's family.  He says that his wife's cousin is coming down for a weekend soon  and they enjoy time with her and that he will go to his brother's house sometime in the next few weeks.  He recognizes the need for people that he cares about in helping with his depression. We will continue to work on coping skills for helping with his an xiety and the progression of his Lewy body dementia diagnosis as well as processing his grief. 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 Speech/Language:  Normal Rate Affect: Appropriate Mood: normal Thought process: normal Thought content:  WNL Sensory/Perceptual disturbances:  WNL Orientation: oriented to person, place, and situati on Attention: Good Concentration: Good Memory: Impaired short term Fund of knowledge:  Good Insight:  Good Judgment:  Good Impulse Control: Good  Risk Assessment: Danger to Self: No Self-injurious Behavior: No Danger to Others: No Duty to Warn:no Physical Aggression / Violence:No  Access to Firearms a concern: No  Gang Involvement:No  Treatment plan: Will employ cognitive behavioral therapy as well as grief and loss t herapy for relief of anxiety and grief.  Goals of grief therapy or to have a healthier response to the loss of his daughter and less sadness as evidenced by patient report in therapy notes as well as to help him feel less overwhelmed by her loss.  Goals are to see a 50% reduction in grief symptoms over the next 6 months.  I will encourage the use of the patient telling his grief story about his daughter including encouraging him to brin g pictures and memorabilia to help process his feelings including any feelings of guilt associated with her loss.  We will use cognitive behavioral therapy to help identify and change anxiety producing thoughts and behavior patterns so as to improve his ability to better manage anxiety and stress, and manage thoughts and worrisome thinking contributing to anxiety.   We will also  refresh and encourage dialectical behavior therapy distress  tolerance and mindfulness skills with the intention of reducing anxiety by 50% over the next 6 months. Progress: 30% Interventions: Cognitive Behavioral Therapy  Diagnosis:PTSD, Bi-Polar D/O  Plan: F/U/ appointments scheduled weekly.  Cecile Coder, [facility]                                                                                                                    Cecile Coder, [facility]               Cecile Coder, [facility]               Cecile Coder, [facility]               Cecile Coder, [facility]  [facility] Counselor/Therapist Progress Note  Patient ID: Blayn Whetsell, MRN: [id number],    Date: 04/01/2024 This session was held via video teletherapy. The patient consented to the video teletherapy and was located in his home during this session. He is aware it is the responsibility of the patient to secur e confidentiality on his end of the session. The provider was in a private home office for the duration of this session.   The patient arrived on time for her Caregility session  Time Spent: 57 minutes, 2 PM to 2:57 PM  Treatment Type: Individual Therapy Reported Symptoms: anxiety, panic attacks The patient is still having significant back pain.  He cannot sit anywhere for very long and has to be careful where he sits.  There is  still some unsteadiness although he did not report any falls over the past 2 weeks.  His biggest concern now is for his wife.  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  She is meeting with her cardiologist again next week to see what possible treatment is for her.  He still thinks there may be some issue with her diaphragm but she is meeting with the pul monologist also.  He is doing what he can to help her.  His daughter's birthday is March 11 so he knows that is a difficult time for both he and his wife next week.  Typically they do something to remember her but because of the wife's current health issues they cannot go to [place] as they had planned so they are talking about what to do to remember and celebrate her birthday.  We will continue to work on coping skills for he lping with his anxiety and the progression of his Lewy body dementia diagnosis as well as processing his grief. 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 Speech/Language:  Normal Rate Affect: Appropriate Mood: normal Thought process: normal Thought content:  WNL Sensory/Perceptual disturbances:  WNL Orientation: oriented to person, p lace, and situation Attention: Good Concentration: Good Memory: Impaired short term Fund of knowledge:  Good Insight:  Good Judgment:  Good Impulse Control: Good  Risk Assessment: Danger to Self: No Self-injurious Behavior: No Danger to Others: No Duty to Warn:no Physical Aggression / Violence:No  Access to Firearms a concern: No  Gang Involvement:No  Treatment plan: Will employ cognitive behavioral therapy as well as  grief and loss therapy for relief of anxiety and grief.  Goals of grief therapy or to have a healthier response to the loss of his daughter and less sadness as evidenced by patient report in therapy notes as well as to help him feel less overwhelmed by her loss.  Goals are to see a 50% reduction in grief symptoms over the next 6 months.  I will encourage the use of the patient telling his grief story about his daughter including encour aging him to bring pictures and memorabilia to help process his feelings including any feelings of guilt associated with her loss.  We will use cognitive behavioral therapy to help identify and change anxiety producing thoughts and behavior patterns so as to improve his ability to better manage anxiety and stress, and manage thoughts and worrisome thinking contributing to anxiety.  We will also refresh and encourage dialectical behavior  therapy distress tolerance and mindfulness skills  with the intention of reducing anxiety by 50% over the next 6 months. Progress: 30% Interventions: Cognitive Behavioral Therapy  Diagnosis:PTSD, Bi-Polar D/O  Plan: F/U/ appointments scheduled weekly.  Cecile Coder,  [facility]                                                                                                                    Cecile Coder, [facility]  [facility] Counselor/Therapist Progress Note  Patient ID: Yoan Sallade, MRN: [id number],    Date: 04/01/2024 This session was held via video teletherapy. The patient consented to the video teletherapy and was located in his home during this session. He is aware it is the responsibility of the patient to secure confidentiality on his end of the session. The provider was in a private home office for the duration of this session.    The patient arrived  on time for her Caregility session  Time Spent: 58 minutes, 2 PM to 2:58 PM  Treatment Type: Individual Therapy Reported Symptoms: anxiety, panic attacks A fairly difficult week because it was his daughter's birthday.  He said this year was more difficult than last year but he could not pinpoint why.  They did remember her about going out to a nice restaurant which is something her daughter always wanted to do.   He said they were able to laugh and joke about funny things that his daughter had said or done which is not something they were able to do this time last year so he saw that as healthy grieving.  He knows that the time change threw things off a little bit for everybody and his wife still is not feeling well and his back has been bothering him.  He was not able to go to his first round of physical therapy this week because his back was  bothering him more.  He has been thinking a lot more about his daughter which he feels comfortable with.  He is thankful that it is getting warmer and staying light longer so he can get back to some of the coping skills such as fishing that he has done in the past.   We will continue to work on coping skills for helping with his anxiety and the progression of his Lewy body dementia diagnosis as well as processing his grief. Mental S tatus Exam: Appearance:  Well Groomed  Behavior: Appropriate Motor: Pt. Reports some instability due to Lewey Body dementia Speech/Language:  Normal Rate Affect: Appropriate Mood: normal Thought process: normal Thought content:  WNL Sensory/Perceptual disturbances:  WNL Orientation: oriented to person, place, and situation Attention: Good Concentration: Good Memory: Impaired short term Fund of knowledge:  Good Insight :  Good Judgment:  Good Impulse Control: Good  Risk Assessment: Danger to Self: No Self-injurious Behavior: No Danger to Others: No Duty to Warn:no Physical Aggression / Violence:No  Access to  Firearms a concern: No  Gang Involvement:No  Treatment plan: Will employ cognitive behavioral therapy as well as grief and loss therapy for relief of anxiety and grief.  Goals of grief therapy or to have a healthier response to the l oss of his daughter and less sadness as evidenced by patient report in therapy notes as well as to help him feel less overwhelmed by her loss.  Goals are to see a 50% reduction in grief symptoms over the next 6 months.  I will encourage the use of the patient telling his grief story about his daughter including encouraging him to bring pictures and memorabilia to help process his feelings including any feelings of guilt associated with  her loss.  We will use cognitive behavioral therapy to help identify and change anxiety producing thoughts and behavior patterns so as to improve his ability to better manage anxiety and stress, and manage thoughts and worrisome thinking contributing to anxiety.  We will also refresh and encourage dialectical behavior therapy  distress tolerance and mindfulness skills with the intention of reducing anxiety by 50% over the next 6 months.  Progress: 30% Interventions: Cognitive Behavioral Therapy  Diagnosis:PTSD, Bi-Polar D/O  Plan: F/U/ appointments scheduled weekly.  Cecile Coder, [facility]                                                                                                                   Cecile Coder, [facility]                Cecile Coder, [facility]               Cecile Coder,  [facility]   Behavioral Health Counselor/Therapist Pr ogress Note  Patient ID: Jaquis Picklesimer, MRN: [id number],    Date: 04/01/2024 This session was held via video teletherapy. The patient consented to the video teletherapy and was located in his home during this session. He is aware it is the responsibility of the patient to secure confidentiality on his end of the session. The provider was in a private home office for the duration of this session.   The patient arrived on time f or her Caregility session  Time Spent: 57 minutes, 2 PM to 2:57 PM  Treatment Type: Individual Therapy Reported Symptoms: anxiety, panic attacks The patient is still having significant back pain.  He cannot sit anywhere for very long and has to be  careful where he sits.  There is still some unsteadiness although he did not report any falls over the past 2 weeks.  His biggest concern now is for his wife.  She has been having some di fficulty breathing and they did a test so they found a small hole on the back side of her heart.  She is meeting with her cardiologist again next week to see what possible treatment is for her.  He still thinks there may be some issue with her diaphragm but she is meeting with the pulmonologist also.  He is doing what he can to help her.  His daughter's birthday is March 11 so he knows that is a difficult time for both he and his wife  next week.  Typically they do something to remember her but because of the wife's current health issues they cannot go to [place] as they had planned so they are talking about what to do to remember and celebrate her birthday.  We will continue to work on coping skills for helping with his anxiety and the progression of his Lewy body dementia diagnosis as well as processing his grief. 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 Speech/Language:  Normal Rate Affect: Appropriate Mood: normal Thought process: normal Thought content:  WNL Sensory/Perceptual disturbances:  WNL Orientation: oriented to person, place, and situation Attention: Good Concentration: Good Memory: Impaired short term Fund of knowledge:  Good Insight:  Good Judgment:  Good Impulse  Control: Good  Risk Assessment: Danger to Self: No Self-injurious Behavior: No Danger to Others: No Duty to Warn:no Physical Aggression / Violence:No  Access to Firearms a concern: No  Gang Involvement:No  Treatment plan: Will employ cognitive behavioral therapy as well as grief and loss therapy for relief of anxiety and grief.  Goals of grief therapy or to have a healthier response to the loss of his daughter and less  sadne ss as evidenced by patient report in therapy notes as well as to help him feel less overwhelmed by her loss.  Goals are to see a 50% reduction in grief symptoms over the next 6 months.  I will encourage the use of the patient telling his grief story about his daughter including encouraging him to bring pictures and memorabilia to help process his feelings including any feelings of guilt associated with her loss.  We will use cognitive b ehavioral therapy to help identify and change anxiety producing thoughts and behavior patterns so as to improve his ability to better manage anxiety and stress, and manage thoughts and worrisome thinking contributing to anxiety.  We will also refresh and encourage dialectical behavior therapy distress tolerance and mindfulness  skills with the intention of reducing anxiety by 50% over the next 6 months. Progress: 30% Interventions: Cog nitive Behavioral Therapy  Diagnosis:PTSD, Bi-Polar D/O  Plan: F/U/ appointments scheduled weekly.  Cecile Coder, [facility]                                                                                                                   Cecile Coder, [facility]  [place] [facility] Counselor/Therapist Progress Note  Patient ID: Jaecob Lowden, MRN: [id number],    Date: 04/01/2024 This session was held via video teletherap y. The patient consented to the video teletherapy and was located in his home during this session. He is aware it is the responsibility of the patient to secure confidentiality on his end of the session. The provider was in a private home office for the duration of this session.   The patient arrived on time for her Caregility session  Time Spent: 57 minutes, 2 PM to 2:57 PM  Treatment Type: Individual Therapy Reported Symptoms:  anxiety, panic attacks  We will continue to work on coping skills for helping with his anxiety and the progression of his Lewy body dementia diagnosis as well as processing his grief. 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 Speech/Language:  Normal Rate Affect: Appropriate Mood: normal Thought process: normal Thought content:  WNL  Sensory/Perceptual disturbances:  WNL Orientation: oriented to person, place, and situation Attention: Good Concentration: Good Memory: Impaired short term Fund of knowledge:   Good Insight:  Good Judgment:  Good Impulse Control: Good  Risk Assessment: Danger to Self: No Self-injurious Behavior: No Danger to Others: No Duty to Warn:no Physical Aggression / Violence:No  Access to Firearms a concern: No  Gang Involvemen t:No  Treatment plan: Will employ cognitive behavioral therapy as well as grief and loss therapy for relief of anxiety and grief.  Goals of grief therapy or to have a healthier response to the loss of his daughter and less sadness as evidenced by patient report in therapy notes as well as to help him feel less overwhelmed by her loss.  Goals are to see a 50% reduction in grief symptoms over the next 6 months.  I will encourage the use  of the patient telling his grief story about his daughter including encouraging him to bring pictures and memorabilia to help process his feelings including any feelings of guilt associated with her loss.  We will use cognitive behavioral therapy to help identify and change anxiety producing thoughts and behavior patterns so as to improve his ability to better manage anxiety and stress, and manage thoughts and worrisome thinking contrib uting to anxiety.  We will also refresh and encourage dialectical behavior therapy distress tolerance and mindfulness skills with the intention of reducing anxiety by 50% over the next 6 months. Progress: 30% Interventions: Cognitive Behavioral Therapy  Diagnosis:PTSD, Bi-Polar D/O  Plan: F/U/ appointments scheduled weekly.  Cecile Coder, [facility]                                                                                                                    Cecile Coder, [facility]               Cecile Coder, [facility]               Cecile Coder, [facility]  Cecile Coder, [facility]               Cecile Coder,  [facility]               Cecile Coder, [facility]                Cecile Coder, [facility]  [place] [facility] Counselor/Therapist Progress Note  Patient ID: Cayleb Jarnigan, MRN: [id number],    Date: 04/01/2024 This session was held via video teletherapy. The patient consented to the video teletherapy and was located in his home during this session. He is aware it is the responsibility of the patient to secure confidentiality on his end of the session. The provider was in a private home office for the duration of this session.   The patient arrived on time for her Caregility session  Time Spent:  39 minutes, 2 PM to 2:39 PM  Treatment Type: Individual Therapy Reported Symptoms: anxiety, panic  attacks The patient was not feeling well today.  He started feeling really earlier in the week having a fever bad headaches body aches.  He felt a little better yesterday but feels a little bit of a resurgence in headaches and body aches and is running a low-grade fever.  His wife is feeling some of the same symptoms.  For the most part  he says he is doing well.  They did increase his memory medication because they were starting to see some increasing decline in short-term memory and he said until recently his long-term memory had been good but he was starting to see a difference in how he used to markers to remember what happened at certain times historically.  He feels the medication has helped and has brightened his mood a little also.  He is still trying to walk ev ery day and fish when he can and has enjoyed watching the [employer] as a good distraction.  He reports that he feels he is managing mood fairly well.  He has an appointment in 2 weeks.  He does contract for safety having no thoughts of hurting himself or anyone else. We will continue to work on coping skills for helping with his anxiety and the progression of his Lewy body dementia diagnosis as well as processing his gr ief. 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 Speech/Language:  Normal Rate Affect: Appropriate Mood: normal Thought process: normal Thought content:  WNL Sensory/Perceptual disturbances:  WNL Orientation: oriented to person, place, and situation Attention: Good Concentration: Good Memory: Impaired short term Fund of knowledge:   Good Insight:  Good Judgment:  Good Impulse Control: Good  Risk Assessment: Danger to Self: No Self-injurious Behavior: No Danger to Others: No Duty to Warn:no Physical Aggression / Violence:No  Access to Firearms a concern: No  Gang Involvement:No  Treatment plan:  Will employ cognitive behavioral therapy as well as grief and loss therapy for relief of anxiety and grief.  Goals of grief therapy or to have a healthier res ponse to the loss of his daughter and less sadness as evidenced by patient report in therapy notes as well as to help him feel less overwhelmed by her loss.  Goals are to see a 50% reduction in grief symptoms over the next 6 months.  I will encourage the use of the patient telling his grief story about his daughter including encouraging him to bring pictures and memorabilia to help process his feelings including any feelings of guilt as sociated with her loss.  We will use cognitive behavioral therapy to help identify and change anxiety producing thoughts and behavior patterns so as to improve his ability to better manage anxiety and stress, and manage thoughts and worrisome thinking contributing to anxiety.  We will also refresh  and encourage dialectical behavior therapy distress tolerance and mindfulness skills with the intention of reducing anxiety by 50% over the n ext 6 months. Progress: 30% Interventions: Cognitive Behavioral Therapy  Diagnosis:PTSD, Bi-Polar D/O  Plan: F/U/ appointments scheduled weekly.  Cecile Coder, [facility]                                                                                                                   Cecile Coder, [facility]  [place] [facility] Counselor/Therapist Progress Note  Patient ID: Abdulrahim Siddiqi, MRN: [id number],    Date: 5/30/ 2025 This session was held via video teletherapy. The patient consented to the video teletherapy and was located in his home during this session. He is aware it is the responsibility of the patient to secure confidentiality on his end of the session. The provider was in a private home office for the duration of this session.   The patient arrived on time for her Caregility session  Time Spent: 58 minutes, 2 PM to 2:58 PM  Treatme nt Type: Individual Therapy Reported Symptoms: anxiety, panic attacks A fairly difficult week because it was his daughter's birthday.  He said this year was more difficult than last year but he could not pinpoint why.  They did remember her about going out to a nice restaurant which is something her daughter always wanted to do.  He said they were able to laugh and joke about funny things that his daughter had said or done which is no t something they were able to do this time last year so he saw that as healthy grieving.  He knows that the time change threw things off a little bit for everybody and his wife still is  not feeling well and his back has been bothering him.  He was not able to go to his first round of physical therapy this week because his back was bothering him more.  He has been thinking a lot more about his daughter which he feels comfortable with.  H e is thankful that it is getting warmer and staying light longer so he can get back to some of the coping skills such as fishing that he has done in the past.   We will continue to work on coping skills for helping with his anxiety and the progression of his Lewy body dementia diagnosis as well as processing his grief. Mental Status Exam: Appearance:  Well Groomed  Behavior: Appropriate Motor: Pt. Reports some instability due t o Lewey Body dementia Speech/Language:  Normal Rate Affect: Appropriate Mood: normal Thought process: normal Thought content:  WNL Sensory/Perceptual disturbances:  WNL Orientation: oriented to person, place, and situation Attention: Good Concentration: Good Memory: Impaired short term Fund of knowledge:  Good Insight:  Good Judgment:  Good Impulse Control: Good  Risk Assessment: Danger to Self: No Self-injurious B ehavior: No Danger to Others: No Duty to Warn:no Physical Aggression / Violence:No  Access to Firearms a concern: No  Gang Involvement:No  Treatment plan: Will employ cognitive behavioral therapy as well as grief and loss therapy for relief of anxiety and grief.  Goals of grief therapy or to have a healthier response to the loss of his daughter and less sadness as evidenced by patient report in therapy notes as well as to help him  feel less overwhelmed by her loss.  Goals are to see a 50% reduction in grief symptoms over the next 6 months.  I will encourage the use of the patient telling his grief story about his daughter including encouraging him to bring pictures and memorabilia to help process his feelings including any feelings of guilt associated with her loss.  We will use cognitive  behavioral therapy to help identify and change anxiety producing thoughts  and behavior patterns so as to improve his ability to better manage anxiety and stress, and manage thoughts and worrisome thinking contributing to anxiety.  We will also refresh and encourage dialectical behavior therapy  distress tolerance and mindfulness skills with the intention of reducing anxiety by 50% over the next 6 months. Progress: 30% Interventions: Cognitive Behavioral Therapy  Diagnosis:PTSD, Bi-Polar D/O  Plan: F/U/ a ppointments scheduled weekly.  Cecile Coder, [facility]                                                                                                                   Cecile Coder, [facility]               Cecile Coder, [facility]               Cecile Coder, [facility]  [place] [facility] Counselor/Therapist Progress Note  Patient ID: Raffaele Derise, MRN: [id number],    Date: 04/01/2024 This session was held via video tel etherapy. The patient consented to the video teletherapy and was located in his home during this session. He is aware it is the responsibility of the patient to secure confidentiality on his end of the session. The provider was in a private home office for the duration of this session.   The patient arrived on time for her Caregility session  Time Spent: 1:05 PM until 2 PM, 55 minutes Treatment Type: Individual Therapy Reported S ymptoms: anxiety, panic attacks The patient said he came in from his walk this morning and saw what he thought was some sort of tic from his wife.  It was not the first time it happened and he wonders if it might be tardive dyskinesia because of some new medications that she is on.  She has a call into the doctor right now and hopes to hear something back soon.  They have narrowed down to the fact they think most of her discomfort in b reathing is coming from the diaphragm and they are looking for alternatives for treatment.  The patient continues to do those things which help him cope including walking, fishing when he can, spending time with his son and daughter-in-law and grandson.  He also has a way of continued coping with grief he is writing letters to his daughters as a way of expressing his feelings and says that is very beneficial.  He appears to be grievin g in a healthy way but he knows that he to year anniversary of her death is coming up and there is some grief there but says it is not overwhelming.  We will continue to work on  coping skills for helping with his anxiety and the progression of his Lewy body dementia diagnosis as well as processing his grief. Mental Status Exam: Appearance:  Well Groomed  Behavior: Appropriate Motor: Pt. Reports some instability due to Lewey Body  dementia Speech/Language:  Normal Rate Affect: Appropriate Mood: normal Thought process: normal Thought content:  WNL Sensory/Perceptual disturbances:  WNL Orientation: oriented to person, place, and situation Attention: Good Concentration: Good Memory: Impaired short term Fund of knowledge:  Good Insight:  Good Judgment:  Good Impulse Control: Good  Risk Assessment: Danger to Self: No Self-injurious Behavior: No  Danger to Others: No Duty to Warn:no Physical Aggression / Violence:No  Access to Firearms a concern: No  Gang Involvement:No  Treatment plan: Will employ cognitive behavioral therapy as well as grief and loss therapy for relief of anxiety and grief.  Goals of grief therapy or to have a healthier response to the loss of his daughter and less sadness as evidenced by patient report in therapy notes as well as to help him feel less o verwhelmed by her loss.  Goals are to see a 50% reduction in grief symptoms over the next 6 months.  I will encourage the use of the patient telling his grief story about his daughter including encouraging him to bring pictures and memorabilia to help process his feelings including any feelings of guilt associated with her loss.  We will use cognitive behavioral therapy to help identify and change anxiety producing thoughts and behavior  patterns so as to improve his ability to better manage anxiety and stress, and manage thoughts and worrisome thinking contributing to anxiety.  We will also refresh and encourage dialectical behavior therapy distress tolerance and  mindfulness skills with the intention of reducing anxiety by 50% over the next 6 months. Progress: 30% Interventions: Cognitive  Behavioral Therapy  Diagnosis:PTSD, Bi-Polar D/O  Plan: F/U/ appointments  scheduled weekly.  Cecile Coder, [facility]                                                                                                                   Cecile Coder, [facility]  [place] [facility] Counselor/Therapist Progress Note  Patient ID: Abid Bolla, MRN: [id number],    Date: 04/01/2024 This session was held via video teletherapy. The patient consented to the video teletherapy and was located in his home during  this session. He is aware it is the responsibility of the patient to secure confidentiality on his end of  the session. The provider was in a private home office for the duration of this session.   The patient arrived on time for her Caregility session  Time Spent: 59 minutes, 2 PM to 2:59 PM  Treatment Type: Individual Therapy Reported Symptoms: anxiety, panic attacks The patient continues to present with physical pain and both  his back and his knee.  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  He has continued to walk because he knows that is good for him in various ways but says it has been painful.  His wife also was not feeling well but she is going back to the doctor  next week to have an ablation and hopes that will bring her some relief.  For the most part he has been at the last couple weeks reaching back out and hearing from some old friends as well as time with his son and son's family.  He says that his wife's cousin is coming down for a weekend soon and they enjoy time with her and that he will go to his brother's house sometime in the next few weeks.  He recognizes the need for people that h e cares about in helping with his depression. We will continue to work on coping skills for helping with his anxiety and the progression of his Lewy body dementia diagnosis as well as processing his grief. 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 Speech/Language:  Normal Rate Affect: Appropriate Mood: normal Thought process: normal Tho ught content:  WNL Sensory/Perceptual disturbances:  WNL Orientation: oriented to person, place, and situation Attention: Good Concentration: Good Memory: Impaired short term Fund of knowledge:  Good Insight:  Good Judgment:  Good Impulse Control: Good  Risk  Assessment: Danger to Self: No Self-injurious Behavior: No Danger to Others: No Duty to Warn:no Physical Aggression / Violence:No  Access to Firearms a concern:  No  Gang Involvement:No  Treatment plan: Will employ cognitive behavioral therapy as well as grief and loss therapy for relief of anxiety and grief.  Goals of grief therapy or to have a healthier response to the loss of his daughter and less sadness as evidenced by patient report in therapy notes as well as to help him feel less overwhelmed by her loss.  Goals are to see a 50% reduction in grief symptoms over the next 6 months.  I wi ll encourage the use of the patient telling his grief story about his daughter including encouraging him to bring pictures and memorabilia to help process his feelings including any feelings of guilt associated with her loss.  We will use cognitive behavioral therapy to help identify and change anxiety producing thoughts and behavior patterns so as to improve his ability to better manage anxiety and stress, and manage thoughts and worri some thinking contributing to anxiety.  We will  also refresh and encourage dialectical behavior therapy distress tolerance and mindfulness skills with the intention of reducing anxiety by 50% over the next 6 months. Progress: 30% Interventions: Cognitive Behavioral Therapy  Diagnosis:PTSD, Bi-Polar D/O  Plan: F/U/ appointments scheduled weekly.  Cecile Coder, [facility]                                                                                                                    Cecile Coder, [facility]               Cecile Coder, [facility]               Cecile Coder, [facility]               Cecile Coder, [facility]  [place] [facility] Counselor/Therapist Progress Note  Patient ID: Treavor Blomquist, MRN: [id number],    Date: 04/01/2024 This session was held via video teletherapy. The patient consented to the video telethera py and was located in his home during this session. He is aware it is the responsibility of the patient to secure confidentiality on his end of the session. The provider was in a private home office for the duration of this session.   The patient arrived on time for her Caregility session  Time Spent: 57 minutes, 2 PM to 2:57 PM  Treatment Type: Individual Therapy Reported Symptoms: anxiety, panic attacks The patient is still ha ving significant back pain.  He cannot sit anywhere for very long and has to be careful where he sits.  There is still some unsteadiness although he did not report any falls over the  past 2 weeks.  His biggest concern now is for his wife.  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  She is meeting with her cardiologist again next week to see what possible t reatment is for her.  He still thinks there may be some issue with her diaphragm but she is meeting with the pulmonologist also.  He is doing what he can to help her.  His daughter's birthday is March 11 so he knows that is a difficult time for both he and his wife next week.  Typically they do something to remember her but because of the wife's current health issues they cannot go to [place] as they had planned so they are talk ing about what to do to remember and celebrate her birthday.  We will continue to work on coping skills for helping with his anxiety and the progression of his Lewy body dementia diagnosis as well as processing his grief. 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 Speech/Language:  Normal Rate Affect: Appropriate Mood: normal Thought pro cess: normal Thought content:  WNL Sensory/Perceptual disturbances:  WNL Orientation: oriented to person, place, and situation Attention: Good Concentration: Good Memory: Impaired short term Fund of knowledge:  Good Insight:  Good Judgment:  Good Impulse Control: Good  Risk Assessment: Danger to Self: No Self-injurious Behavior: No Danger to Others: No Duty to Warn:no Physical Aggression / Violence:No  Access to Fi rearms a concern: No  Gang Involvement:No  Treatment plan: Will employ cognitive behavioral therapy as well as grief and loss therapy for relief of anxiety and grief.  Goals of grief therapy or to have a healthier response to the loss of his daughter and less sadness as evidenced by patient report in therapy notes as well as to help him feel less overwhelmed by  her loss.  Goals are to see a 50% reduction in grief symptoms over the nex t 6 months.  I will encourage the use of the patient telling his grief story about his daughter including encouraging him to bring pictures and memorabilia to help process his feelings including any feelings of guilt associated with her loss.  We will use cognitive behavioral therapy to help identify and change anxiety producing thoughts and behavior patterns so as to improve his ability to better manage anxiety and stress, and manage t houghts and worrisome thinking contributing to anxiety.  We will also refresh and encourage dialectical behavior therapy distress tolerance and mindfulness  skills with the intention of reducing anxiety by 50% over the next 6 months. Progress: 30% Interventions: Cognitive Behavioral Therapy  Diagnosis:PTSD, Bi-Polar D/O  Plan: F/U/ appointments scheduled weekly.  Cecile Coder, [facility]                                                                                                                    Cecile Coder, [facility]  [place] [facility] Counselor/Therapist Progress Note  Patient ID: Sarvesh Meddaugh, MRN: [id number],    Date: 04/01/2024 This session was held via video teletherapy. The patient consented to the video teletherapy and was located in his home during this session. He is aware it is the responsibility of the patient to secure confidential ity on his end of the session. The provider was in a private home office for the duration of this session.   The patient arrived on time for her Caregility session  Time Spent: 58 minutes, 2 PM to 2:58 PM  Treatment Type: Individual Therapy Reported Symptoms: anxiety, panic attacks A fairly difficult week because it was his daughter's birthday.  He said this year was more difficult than last year but he could not pinpoint why.   They did remember her about going out to a nice restaurant which is something her daughter always wanted to do.  He said they were able to laugh and joke about funny things that his daughter had said or done which is not something they were able to do this time last year so he saw that as healthy grieving.  He knows that the time change threw things off a little bit for everybody and his wife still is not feeling well and his back has b een bothering him.  He was not able to go to his first round of physical therapy this week because his back was bothering him more.  He has been thinking a lot more about his daughter  which he feels comfortable with.  He is thankful that it is getting warmer and staying light longer so he can get back to some of the coping skills such as fishing that he has done in the past.   We will continue to work on coping skills for helping wit h his anxiety and the progression of his Lewy body dementia diagnosis as well as processing his grief. 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 Speech/Language:  Normal Rate Affect: Appropriate Mood: normal Thought process: normal Thought content:  WNL Sensory/Perceptual disturbances:  WNL Orientation: oriented to person, place, and  situation Attention: Good Concentration: Good Memory: Impaired short term Fund of knowledge:  Good Insight:  Good Judgment:  Good Impulse Control: Good  Risk Assessment: Danger to Self: No Self-injurious Behavior: No Danger to Others: No Duty to Warn:no Physical Aggression / Violence:No  Access to Firearms a concern: No  Gang Involvement:No  Treatment plan: Will employ cognitive behavioral therapy as well as grief an d loss therapy for relief of anxiety and grief.  Goals of grief therapy or to have a healthier response to the loss of his daughter and less sadness as evidenced by patient report in therapy notes as well as to help him feel less overwhelmed by her loss.  Goals are to see a 50% reduction in grief symptoms over the next 6 months.  I will encourage the use of the patient telling his grief story about his daughter including encouraging him  to bring pictures and memorabilia to help process his feelings including any feelings of guilt associated with her loss.  We will use cognitive behavioral therapy to help identify and change anxiety producing thoughts and behavior patterns so as to improve his ability to better manage anxiety and stress, and manage thoughts and worrisome thinking contributing to  anxiety.  We will also refresh and encourage dialectical behavior therapy  distress tolerance and mindfulness skills with the intention of reducing anxiety by 50% over the next 6 months. Progress: 30% Interventions: Cognitive Behavioral Therapy  Diagnosis:PTSD, Bi-Polar D/O  Plan: F/U/ appointments scheduled weekly.  Cecile Coder, [facility]                                                                                                                    Cecile Coder, [facility]               Cecile Coder, [facility]               Cecile Coder, [facility]  [place] [facility] Counselor/Therapist  Progress Note  Patient ID: Denim Kalmbach, MRN: [id number],    Date: 04/01/2024 This session was held via video teletherapy. The patient consented to the video teletherapy and was located in his home during this session. He is aware it is the responsibility of the patient to secure confidentiality on his end of the session . The provider was in a private home office for the duration of this session.   The patient arrived on time for her Caregility session  Time Spent: 57 minutes, 2 PM to 2:57 PM  Treatment Type: Individual Therapy Reported Symptoms: anxiety, panic attacks The patient is still having significant back pain.  He cannot sit anywhere for very long and has to be careful where he sits.  There is still some unsteadiness although he did no t report any falls over the past 2 weeks.  His biggest concern now is for his wife.  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  She is meeting with her cardiologist again next week to see what possible treatment is for her.  He still thinks there may be some issue with her diaphragm but she is meeting with the pulmonologist also.  He is doing what he can t o help her.  His daughter's birthday is [birth year] 11 so he knows that is a difficult time for both he and his wife next week.  Typically they do something to remember her but because of the wife's current health issues they cannot go to [place] as they had planned so they are talking about what to do to remember and celebrate her birthday.  We will continue to work on coping skills for helping with his anxiety and the progression  of his Lewy body dementia diagnosis as well as processing his grief. 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 Speech/Language:  Normal Rate Affect: Appropriate Mood: normal Thought  process: normal Thought content:  WNL Sensory/Perceptual disturbances:  WNL Orientation: oriented to person, place, and situation Attention: Good Conce ntration: Good Memory: Impaired short term Fund of knowledge:  Good Insight:  Good Judgment:  Good Impulse Control: Good  Risk Assessment: Danger to Self: No Self-injurious Behavior: No Danger to Others: No Duty to Warn:no Physical Aggression / Violence:No  Access to Firearms a concern: No  Gang Involvement:No  Treatment plan: Will employ cognitive behavioral therapy as well as grief and loss therapy for relief of anxie ty and grief.  Goals of grief therapy or to have a healthier response to the loss of his daughter and less sadness as evidenced by patient report in therapy notes as well as to help him feel less overwhelmed by her loss.  Goals are to see a 50% reduction in grief symptoms over the next 6 months.  I will encourage the use of the patient telling his grief story about his daughter including encouraging him to bring pictures and memorabilia  to help process his feelings including any feelings of guilt associated with her loss.  We will use cognitive behavioral therapy to help identify and change anxiety producing thoughts and behavior patterns so as to improve his ability to better manage anxiety and stress, and manage thoughts and worrisome thinking contributing to anxiety.  We will also refresh and encourage dialectical behavior therapy distress tolerance and mindfulness  skills with the intention of reducing anxiety by 50% over the next 6 months. Progress: 30% Interventions: Cognitive Behavioral Therapy  Diagnosis:PTSD, Bi-Polar D/O  Plan: F/U/ appointments scheduled weekly.  Cecile Coder, [facility]                                                                                                                     Cecile Coder, [facility]  [place] [facility] Counselor/Therapist Progress Note  Patient ID:  Seger Jani, MRN: [id number],    Date: 04/01/2024 This session was held via video teletherapy. The patient consented to the video teletherapy and was located in his home during this session. He is aware it is the responsibility of the patient to secure confidentiality on his end of the session. The provider was in a private home office for the duration of this session.   The patient arrived on time for her Caregility session   Time Spent: 57 minutes, 2 PM to 2:57 PM  Treatment Type: Individual Therapy Reported Symptoms: anxiety, panic attacks  The patient's wife continues to heal and improve.  For the  most part physically he has done fairly well this week.  The hardest part has been that thy had tp put their  had to put their dog to sleep. Aaron AasHe feels that he is coping well but indicated that it did stir up some feelings  He had been in failing health fo r a while and was about 63 years old. It did bring sadness to him.  For the most part it has been a good week. He is still walking. He is writing in his journal about his daughter. Hi anxiety in manageable and depression mild.  He does contract for safety. 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 Speech/Language:  Normal Rate Affect: A ppropriate Mood: normal Thought process: normal Thought content:  WNL Sensory/Perceptual disturbances:  WNL Orientation: oriented to person, place, and situation Attention: Good Concentration: Good Memory: Impaired short term Fund of knowledge:  Good Insight:  Good Judgment:  Good Impulse Control: Good  Risk Assessment: Danger to Self: No Self-injurious Behavior: No Danger to Others: No Duty to Warn:no Physical Ag gression / Violence:No  Access to Firearms a concern: No  Gang Involvement:No  Treatment plan: Will employ cognitive behavioral therapy as well as grief and loss therapy for relief of anxiety and grief.  Goals of grief therapy or to have a healthier response to the loss of his daughter and less sadness as evidenced by patient report in therapy notes as well as to help him feel less overwhelmed by her loss.  Goals are to see a 50% red uction in grief symptoms over the next 6 months.  I will encourage the use of the patient telling his grief story about his daughter including encouraging him to bring pictures and memorabilia to help process his feelings including any feelings of guilt associated with her loss.  We will use cognitive behavioral therapy to help identify and change anxiety producing  thoughts and behavior patterns so as to improve his ability to better ma nage anxiety and stress, and manage thoughts and worrisome thinking contributing to anxiety.  We will also refresh and encourage dialectical behavior therapy distress tolerance and mindfulness skills with the intention of reducing anxiety by 50% over the next 6 months. Progress: 35% Reviewed target goals with a new target date of October 31st, 2025. Interventions: Cognitive Behavioral Therapy  Diagnosis:PTSD, Bi-Polar D/O  Plan: F /U/ appointments scheduled weekly.  Cecile Coder, [facility]  Cecile Coder, [facility]                   Cecile Coder, [facility]               Cecile Coder, [facility]                Cecile Coder, [facility]               Cecile Coder, [facility]   Behavioral Health Counselor/Therapist Progre ss Note  Patient ID: Graeme Menees, MRN: [id number],    Date: 04/01/2024 This session was held via video teletherapy. The patient consented to the video teletherapy and was located in his home during this session. He is aware it is the responsibility of the patient to secure confidentiality on his end of the session. The provider was in a private home office for the duration of this session.   The patient arrived on time for h er Caregility session  Time Spent: 59 minutes, 2 PM to 2:59 PM  Treatment Type: Individual Therapy Reported Symptoms: anxiety, panic attacks The patient said he jokingly said to his wife that he had not had any pain issues in the cervical area and then a few days ago he started having pain in his neck and shoulders.  He now sits related to degenerative disk and has some consistent pain somewhere in his spine with that but jokingly  says it just depends on Wednesday which part of his body is decides to show up and hurt.  He is doing the best that he can.  He is still walking which he knows is good for him but does not have right now the strength to do as much in the morning as he has done in the past so he is denying that to 3 times a day for consistency.  He is still doing some positive things and that he is staying in touch with his brother and other family membe rs.  He is reconnecting with friends from high school and he is which she is enjoying.  One of them has had a similar experience in that she lost her son in the same way the patient  lost his daughter.  He said they did not think and Easter in which they set a place for his daughter at the table and put her picture there.  He initially had reservations about doing that he and his wife had made everyone feel as if she was really there wit h him.  He still feels that he is grieving in a healthy way.  He and his wife have always taken a trip around both of their birthdays which are 2 days apart.  That is now close to his daughter died so they have started taking some of her rashes with him and sprinkling them where they go as a way of remembrance.  They are deciding what that looks like this year in July.  He does contract for safety having no thoughts of hurting himself  or anyone else. We will continue to work on coping skills for helping with his anxiety and the progression of his Lewy body dementia diagnosis as well as processing his grief. 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 Speech/Language:  Normal Rate Affect: Appropriate Mood: normal Thought process: normal Thought content:  WNL Sensory/ Perceptual disturbances:  WNL Orientation: oriented to person, place, and situation Attention: Good Concentration: Good Memory: Impaired short term Fund of knowledge:  Good Insight:  Good Judgment:  Good Impulse Control: Good  Risk Assessment: Danger to Self: No Self-injurious Behavior: No Danger to Others: No Duty to Warn:no Physical Aggression / Violence:No  Access to Firearms a concern: No  Gang Involvement:No  T reatment plan: Will employ cognitive behavioral therapy as well as grief and loss therapy for relief of anxiety and grief.  Goals of grief therapy  or to have a healthier response to the loss of his daughter and less sadness as evidenced by patient report in therapy notes as well as to help him feel less overwhelmed by her loss.  Goals are to see a 50% reduction in  grief symptoms over the next 6 months.  I will encourage the use of the p atient telling his grief story about his daughter including encouraging him to bring pictures and memorabilia to help process his feelings including any feelings of guilt associated with her loss.  We will use cognitive behavioral therapy to help identify and change anxiety producing thoughts and behavior patterns so as to improve his ability to better manage anxiety and stress, and manage thoughts and worrisome thinking contributing to  anxiety.  We will also refresh and encourage dialectical behavior therapy distress tolerance and mindfulness skills with the intention of reducing anxiety by 50% over the next 6 months. Progress: 30% Interventions: Cognitive Behavioral Therapy  Diagnosis:PTSD, Bi-Polar D/O  Plan: F/U/ appointments scheduled weekly.  Cecile Coder, [facility]                                                                                                                    Cecile Coder, [facility]  [place] [facility] Counselor/Therapist Progress Note  Patient ID: Jadrien Narine, MRN: [id number],    Date: 04/01/2024 This session was held via video teletherapy. The patient consented to the video teletherapy and was located in his home during this session. He is aware it is the responsibility of the patient to secure confidentiality on his end of the session. The provider wa s in a private home office for the duration of this session.   The patient arrived on time for her Caregility session  Time Spent: 58 minutes, 2 PM to 2:58 PM  Treatment Type: Individual Therapy Reported Symptoms: anxiety, panic attacks A fairly difficult week because it was his daughter's birthday.  He said this year was more difficult than last year but he could not pinpoint why.  They did remember her about going out to a nic [employer] which is something her daughter always wanted to do.  He said they were able to laugh and joke about funny things that his daughter had said or done which is not something they were able to do this time last year so he saw that as healthy grieving.  He knows that the time change threw things off a little bit for everybody and his wife still is not feeling well and his back has been bothering him.  He was not able to go to h is first round of physical therapy this week because his back was bothering him more.  He has been thinking a lot more about his daughter which he feels comfortable with.  He is  thankful that it is getting warmer and staying light longer so he can get back to some of the coping skills such as fishing that he has done in the past.   We will continue to work on coping skills for helping with his anxiety and the progression of his Lewy  body dementia diagnosis as well as processing his grief. 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 Speech/Language:  Normal Rate Affect: Appropriate Mood: normal Thought process: normal Thought content:  WNL Sensory/Perceptual disturbances:  WNL Orientation: oriented to person, place, and situation Attention: Good Concentration: Go od Memory: Impaired short term Fund of knowledge:  Good Insight:  Good Judgment:  Good Impulse Control: Good  Risk Assessment: Danger to Self: No Self-injurious Behavior: No Danger to Others: No Duty to Warn:no Physical Aggression / Violence:No  Access to Firearms a concern: No  Gang Involvement:No  Treatment plan: Will employ cognitive behavioral therapy as well as grief and loss therapy for relief of anxiety and grief .  Goals of grief therapy or to have a healthier response to the loss of his daughter and less sadness as evidenced by patient report in therapy notes as well as to help him feel less overwhelmed by her loss.  Goals are to see a 50% reduction in grief symptoms over the next 6 months.  I will encourage the use of the patient telling his grief story about his daughter including encouraging him to bring pictures and memorabilia to help pro cess his feelings including any feelings of guilt associated with her loss.  We will use cognitive behavioral therapy to help identify and change anxiety producing thoughts and behavior patterns so as to improve his ability to better manage anxiety and stress, and manage thoughts and worrisome thinking contributing to anxiety.  We will also refresh and  encourage dialectical behavior therapy distress  tolerance and mindfulness skills with  the intention of reducing anxiety by 50% over the next 6 months. Progress: 30% Interventions: Cognitive Behavioral Therapy  Diagnosis:PTSD, Bi-Polar D/O  Plan: F/U/ appointments scheduled weekly.  Cecile Coder, [facility]                                                                                                                    Cecile Coder, [facility]               Cecile Coder, [facility]               Cecile Coder, [facility]  [place] [facility] Counselor/Therapist Progress Note  Patient ID: Daiton Cowles,  MRN: [id number],    Date: 04/01/2024 This session was held via video teletherapy. The patient consented to the video teletherapy and was located in his home during this session. He is aware it is the responsibility of the patient to secure confidentiality on his end of the session. The provider was in a private home office for the durati on of this session.   The patient arrived on time for her Caregility session  Time Spent: 1:05 PM until 2 PM, 55 minutes Treatment Type: Individual Therapy Reported Symptoms: anxiety, panic attacks The patient said he came in from his walk this morning and saw what he thought was some sort of tic from his wife.  It was not the first time it happened and he wonders if it might be tardive dyskinesia because of some new medications  that she is on.  She has a call into the doctor right now and hopes to hear something back soon.  They have narrowed down to the fact they think most of her discomfort in breathing is coming from the diaphragm and they are looking for alternatives for treatment.  The patient continues to do those things which help him cope including walking, fishing when he can, spending time with his son and daughter-in-law and grandson.  He also ha s a way of continued coping with grief he is writing letters to his daughters as a way of expressing his feelings and says that is very beneficial.  He appears to be grieving in a healthy way but he knows that he to year anniversary of her death is coming up and there is some grief there but says it is not overwhelming.  We will continue to work on coping skills for helping with his anxiety and the progression of his Lewy body dementi a diagnosis as well as processing his grief. 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 Speech/Language:  Normal Rate Affect: Appropriate Mood: normal Thought process: normal Thought  content:  WNL Sensory/Perceptual disturbances:  WNL Orientation: oriented to person, place, and situation Attention: Good Concentration: Good Memory:  Impaired short term Fund of knowledge:  Good Insight:  Good Judgment:  Good Impulse Control: Good  Risk Assessment: Danger to Self: No Self-injurious Behavior: No Danger to Others: No Duty to Warn:no Physical Aggression / Violence:No  Access to Firearms a concern: No  Gang Involvement:No  Treatment plan: Will employ cognitive behavioral therapy as well as grief and loss therapy for relief of anxiety and grief.  Goals of  grief therapy or to have a healthier response to the loss of his daughter and less sadness as evidenced by patient report in therapy notes as well as to help him feel less overwhelmed by her loss.  Goals are to see a 50% reduction in grief symptoms over the next 6 months.  I will encourage the use of the patient telling his grief story about his daughter including encouraging him to bring pictures and memorabilia to help process his fee lings including any feelings of guilt associated with her loss.  We will use cognitive behavioral therapy to help identify and change anxiety producing thoughts and behavior patterns so as to improve his ability to better manage anxiety and stress, and manage thoughts and worrisome thinking contributing to anxiety.  We will also refresh and encourage dialectical behavior therapy distress tolerance and  mindfulness skills with the intenti on of reducing anxiety by 50% over the next 6 months. Progress: 30% Interventions: Cognitive Behavioral Therapy  Diagnosis:PTSD, Bi-Polar D/O  Plan: F/U/ appointments scheduled weekly.  Cecile Coder, [facility]                                                                                                                    Cecile Coder,  [facility]  [place] [facility] Counselor/Therapist Progress Note  Patient ID: Devarion Mcclanahan, MRN: [id number],    Date: 04/01/2024 This session was held via video teletherapy. The patient consented to the video teletherapy and was located in his home during this session. He is aware it is the responsibility of the patient to secure confidentiality on his end of the session. The provider was in a private home office for the duration of this session.   The patient arrived on time for her Caregility session  Time Spen t: 59 minutes, 2 PM to 2:59 PM  Treatment Type: Individual Therapy Reported Symptoms: anxiety, panic attacks The patient continues to present with physical pain and both his back and his knee.  He  started with a new physical therapist and told him that there was a lot of arthritis in both knees and or certain things he cannot do but they had him do that anyway and now for the past 3 days he has been in significant knee pain.  He has  continued to walk because he knows that is good for him in various ways but says it has been painful.  His wife also was not feeling well but she is going back to the doctor next week to have an ablation and hopes that will bring her some relief.  For the most part he has been at the last couple weeks reaching back out and hearing from some old friends as well as time with his son and son's family.  He says that his wife's cousin is com ing down for a weekend soon and they enjoy time with her and that he will go to his brother's house sometime in the next few weeks.  He recognizes the need for people that he cares about in helping with his depression. We will continue to work on coping skills for helping with his anxiety and the progression of his Lewy body dementia diagnosis as well as processing his grief. 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 Speech/Language:  Normal Rate Affect: Appropriate Mood: normal Thought process: normal Thought content:  WNL Sensory/Perceptual disturbances:  WNL Orientation: oriented to person, place, and situation Attention: Good Concentration: Good Memory: Impaired short term Fund of knowledge:  Good Insight:  Good Judgment:  Good Impulse Control: Good   Risk Assessment: Danger to Self: No Self-injurious Behavior: No Danger to Others: No Duty to Warn:no Physical Aggression / Violence:No  Access to Firearms a concern: No  Gang Involvement:No  Treatment plan: Will employ cognitive behavioral therapy as well as grief and loss therapy for relief of anxiety and grief.  Goals of grief therapy or to have a healthier response to  the loss of his daughter and less sadness as evidenced b y patient report in therapy notes as well as to help him feel less overwhelmed by her loss.  Goals are to see a 50% reduction in grief symptoms over the next 6 months.  I will encourage the use of the patient telling his grief story about his daughter including encouraging him to bring pictures and memorabilia to help process his feelings including any feelings of guilt associated with her loss.  We will use cognitive behavioral therapy  to help identify and change anxiety producing thoughts and behavior patterns so as to improve his ability to better manage anxiety and stress, and manage thoughts and worrisome thinking contributing to anxiety.  We will  also refresh and encourage dialectical behavior therapy distress tolerance and mindfulness skills with the intention of reducing anxiety by 50% over the next 6 months. Progress: 30% Interventions: Cognitive Behavioral  Therapy  Diagnosis:PTSD, Bi-Polar D/O  Plan: F/U/ appointments scheduled weekly.  Cecile Coder, [facility]                                                                                                                   Cecile Coder, [facility]               Cecile Coder, [facility]                Cecile Coder, [facility]               Cecile Coder, [facility]  [place] [facility] Counselor/Therapist Pr ogress Note  Patient ID: Hilario Robarts, MRN: [id number],    Date: 04/01/2024 This session was held via video teletherapy. The patient consented to the video teletherapy and was located in his home during this session. He is aware it is the responsibility of the patient to secure confidentiality on his end of the session. The provider was in a private home office for the duration of this session.   The patient arrived on time f or her Caregility session  Time Spent: 57 minutes, 2 PM to 2:57 PM  Treatment Type: Individual Therapy Reported Symptoms: anxiety, panic attacks The patient is still having significant back pain.  He cannot sit anywhere for very long and has to be careful where he sits.  There is still some unsteadiness although he did not report any falls over the past 2 weeks.  His biggest concern now is for his wife.  She has been having some di fficulty breathing and they did a test so they found a small hole on the back side of her heart.  She is meeting with her cardiologist again next week to see what possible treatment is  for her.  He still thinks there may be some issue with her diaphragm but she is meeting with the pulmonologist also.  He is doing what he can to help her.  His daughter's birthday is March 11 so he knows that is a difficult time for both he and his wife  next week.  Typically they do something to remember her but because of the wife's current health issues they cannot go to [place] as they had planned so they are talking about what to do to remember and celebrate her birthday.  We will continue to work on coping skills for helping with his anxiety and the progression of his Lewy body dementia diagnosis as well as processing his grief. 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 Speech/Language:  Normal Rate Affect: Appropriate Mood: normal Thought process: normal Thought content:  WNL Sensory/Perceptual disturbances:  WNL Orientation: oriented to person, place, and situation Attention: Good Concentration: Good Memory: Impaired short term Fund of knowledge:  Good Insight:  Good Judgment:  Good Impulse  Control: Good  Risk Assessment: Danger to Self: No Self-injurious Behavior: No Danger to Others: No Duty to Warn:no Physical Aggression / Violence:No  Access to Firearms a concern: No  Gang Involvement:No  Treatment plan: Will employ cognitive behavioral therapy as well as grief and loss therapy for relief of anxiety and grief.  Goals of grief therapy or to have a healthier response to the loss of his daughter and less sadne ss as evidenced by patient report in therapy notes as well as to help him feel less overwhelmed by her loss.  Goals are to see a 50% reduction in grief symptoms over the next 6 months.  I will encourage the use of the patient telling his grief story about his daughter including encouraging him to bring pictures and memorabilia to help process his feelings including  any feelings of guilt associated with her loss.  We will use cognitive b ehavioral therapy to help identify and change anxiety producing thoughts and behavior patterns so as to improve his ability to better manage anxiety and stress, and manage thoughts and worrisome thinking contributing to anxiety.  We will also refresh and encourage dialectical behavior therapy distress tolerance and mindfulness  skills with the intention of reducing anxiety by 50% over the next 6 months. Progress: 30% Interventions: Cog nitive Behavioral Therapy  Diagnosis:PTSD, Bi-Polar D/O  Plan: F/U/ appointments scheduled weekly.  Cecile Coder, [facility]                                                                                                                   Cecile Coder, [facility]  [place] [facility] Counselor/Therapist Progress Note  Patient ID: Ziyad Dyar, MRN: [id number],    Date: 04/01/2024 This session was held via video teletherap y. The patient consented to the video teletherapy and was located in his home during this session. He is aware it is the responsibility of the patient to secure confidentiality on his end of the session. The provider was in a private home office for the duration of this session.   The patient arrived on time for her Caregility session  Time Spent: 58 minutes, 2 PM to 2:58 PM  Treatment Type: Individual Therapy Reported Symptoms:  anxiety, panic attacks A fairly difficult week because it was his daughter's birthday.  He said this year was more difficult than last year but he could not pinpoint why.  They did remember her about going out to a nice restaurant which is something her daughter always wanted to do.  He said they were able to laugh and joke about funny things that his daughter had said or done which is not something they were able to do this time last  year so he saw that as healthy grieving.  He knows that the time change threw things off a little bit for everybody and his wife still is not feeling well and his back has been bothering him.  He was not able to go to his first round of physical therapy this week because his back was bothering him more.  He has been thinking a lot more about his daughter which he feels comfortable with.  He is thankful that it is getting warmer and stay ing light longer so he can get back to some of the coping skills such as fishing that he has done in the past.   We will continue to work on coping skills for helping with his  anxiety and the progression of his Lewy body dementia diagnosis as well as processing his grief. 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 Speech/Language:  Normal  Rate Affect: Appropriate Mood: normal Thought process: normal Thought content:  WNL Sensory/Perceptual disturbances:  WNL Orientation: oriented to person, place, and situation Attention: Good Concentration: Good Memory: Impaired short term Fund of knowledge:  Good Insight:  Good Judgment:  Good Impulse Control: Good  Risk Assessment: Danger to Self: No Self-injurious Behavior: No Danger to Others: No Duty to Warn: no Physical Aggression / Violence:No  Access to Firearms a concern: No  Gang Involvement:No  Treatment plan: Will employ cognitive behavioral therapy as well as grief and loss therapy for relief of anxiety and grief.  Goals of grief therapy or to have a healthier response to the loss of his daughter and less sadness as evidenced by patient report in therapy notes as well as to help him feel less overwhelmed by her loss.  Goals are t o see a 50% reduction in grief symptoms over the next 6 months.  I will encourage the use of the patient telling his grief story about his daughter including encouraging him to bring pictures and memorabilia to help process his feelings including any feelings of guilt associated with her loss.  We will use cognitive behavioral therapy to help identify and change anxiety producing thoughts and behavior patterns so as to improve his abili ty to better manage anxiety and stress, and manage thoughts and worrisome thinking contributing to anxiety.  We will also refresh and encourage dialectical behavior therapy distress  tolerance and mindfulness skills with the intention of reducing anxiety by 50% over the next 6 months. Progress: 30% Interventions: Cognitive Behavioral Therapy  Diagnosis:PTSD,  Bi-Polar D/O  Plan: F/U/ appointments scheduled weekly.  Cecile Coder,  [facility]                                                                                                                   Cecile Coder, [facility]               Cecile Coder, [facility]               Cecile Coder, [facility]  [place] [facility] Counselor/Therapist Progress Note  Patient ID: Rafael Salway, MRN: [id number],    Date: 04/01/2024 This session was held via video teletherapy. The patient consented to t he video teletherapy and was located in his home during this session. He is aware it is the responsibility  of the patient to secure confidentiality on his end of the session. The provider was in a private home office for the duration of this session.   The patient arrived on time for her Caregility session  Time Spent: 57 minutes, 2 PM to 2:57 PM  Treatment Type: Individual Therapy Reported Symptoms: anxiety, panic attacks The p atient is still having significant back pain.  He cannot sit anywhere for very long and has to be careful where he sits.  There is still some unsteadiness although he did not report any falls over the past 2 weeks.  His biggest concern now is for his wife.  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  She is meeting with her cardiologist again next week to s ee what possible treatment is for her.  He still thinks there may be some issue with her diaphragm but she is meeting with the pulmonologist also.  He is doing what he can to help her.  His daughter's birthday is March 11 so he knows that is a difficult time for both he and his wife next week.  Typically they do something to remember her but because of the wife's current health issues they cannot go to [place] as they had planne d so they are talking about what to do to remember and celebrate her birthday.  We will continue to work on coping skills for helping with his anxiety and the progression of his Lewy body dementia diagnosis as well as processing his grief. 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 Speech/Language:  Normal Rate Affect: Appropriate Mood: n ormal Thought process: normal Thought content:  WNL Sensory/Perceptual disturbances:  WNL Orientation: oriented to person, place, and situation Attention: Good Concentration: Good Memory: Impaired short term Fund of knowledge:  Good Insight:  Good Judgment:  Good Impulse  Control: Good  Risk Assessment: Danger to Self: No Self-injurious Behavior: No Danger to Others: No Duty to Warn:no Physical Aggression / Violence :No  Access to Firearms a concern: No  Gang Involvement:No  Treatment plan: Will employ cognitive behavioral therapy as well as grief and loss therapy for relief of anxiety and grief.  Goals of grief therapy or to have a healthier response to the loss of his daughter and less sadness as evidenced by patient report in therapy notes as well as to help him feel less overwhelmed by her loss.  Goals are to see a 50% reduction in grief sym ptoms over the next 6 months.  I will encourage the use of the patient telling his grief story about his daughter including encouraging him to bring pictures and memorabilia to help process his feelings including any feelings of guilt associated with her loss.  We will use cognitive behavioral therapy to help identify and change anxiety producing thoughts and behavior patterns so as to improve his ability to better manage anxiety and st ress, and manage thoughts and worrisome thinking contributing to anxiety.  We will also refresh and encourage dialectical behavior therapy distress tolerance and mindfulness  skills with the intention of reducing anxiety by 50% over the next 6 months. Progress: 30% Interventions: Cognitive Behavioral Therapy  Diagnosis:PTSD, Bi-Polar D/O  Plan: F/U/ appointments scheduled weekly.  Cecile Coder, [facility]                                                                                                                    Cecile Coder, [facility]  [place] [facility] Counselor/Therapist Progress Note  Patient ID: Bodee Lafoe, MRN: [id number],    Date: 04/01/2024 This session was held via video teletherapy. The patient consented to the video teletherapy and was located in his home during this session. He is aware it is the responsibility of the patient to s ecure confidentiality on his end of the session. The provider was in a private home office for the duration of this session.   The patient arrived on time for her Caregility session  Time Spent: 57 minutes, 2 PM to 2:57 PM  Treatment Type: Individual Therapy Reported Symptoms: anxiety, panic attacks  We will continue to work on coping skills for helping with his anxiety and the progression of his Lewy body dementia diagnosis as  well as processing his grief. Mental Status Exam: Appearance:  Well Groomed  Behavior: Appropriate Motor: Pt. Reports some instability due to Lewey Body  dementia Speech/Language:  Normal Rate Affect: Appropriate Mood: normal Thought process: normal Thought content:  WNL Sensory/Perceptual disturbances:  WNL Orientation: oriented to person, place, and situation Attention: Good Concentration: Good Memory: Impaired short  term Fund of knowledge:  Good Insight:  Good Judgment:  Good Impulse Control: Good  Risk Assessment: Danger to Self: No Self-injurious Behavior: No Danger to Others: No Duty to Warn:no Physical Aggression / Violence:No  Access to Firearms a concern: No  Gang Involvement:No  Treatment plan: Will employ cognitive behavioral therapy as well as grief and loss therapy for relief of anxiety and grief.  Goals of grief therapy  or to have a healthier response to the loss of his daughter and less sadness as evidenced by patient report in therapy notes as well as to help him feel less overwhelmed by her loss.  Goals are to see a 50% reduction in grief symptoms over the next 6 months.  I will encourage the use of the patient telling his grief story about his daughter including encouraging him to bring pictures and memorabilia to help process his feelings includin g any feelings of guilt associated with her loss.  We will use cognitive behavioral therapy to help identify and change anxiety producing thoughts and behavior patterns so as to improve his ability to better manage anxiety and stress, and manage thoughts and worrisome thinking contributing to anxiety.  We will also refresh and encourage dialectical behavior therapy distress tolerance and mindfulness skills with the intention of reducing  anxiety by 50% over the next 6 months. Progress: 30% Interventions: Cognitive Behavioral Therapy  Diagnosis:PTSD, Bi-Polar D/O  Plan: F/U/ appointments scheduled weekly.  Cecile Coder,  [facility]                                                                                                                   [address] [place]               Cecile Coder, [facility]               Cecile Coder, [facility]  Cecile Coder, [facility]               Cecile Coder, [facility]               Cecile Coder, [facility]               Cecile Coder, [facility]  [place] [facility] Counselor/Therapist Progress Note  Patient ID: Antonis Lor, MRN: [id number],    Date: 04/01/2024 This session was held via video teletherapy. The  patient consented to the video teletherapy and was located in h is home during this session. He is aware it is the responsibility of the patient to secure confidentiality on his end of the session. The provider was in a private home office for the duration of this session.   The patient arrived on time for her Caregility session  Time Spent: 39 minutes, 2 PM to 2:39 PM  Treatment Type: Individual Therapy Reported Symptoms: anxiety, panic attacks The patient was not feeling well today.  He st arted feeling really earlier in the week having a fever bad headaches body aches.  He felt a little better yesterday but feels a little bit of a resurgence in headaches and body aches and is running a low-grade fever.  His wife is feeling some of the same symptoms.  For the most part he says he is doing well.  They did increase his memory medication because they were starting to see some increasing decline in short-term memory and he sa id until recently his long-term memory had been good but he was starting to see a difference in how he used to markers to remember what happened at certain times historically.  He feels the medication has helped and has brightened his mood a little also.  He is still trying to walk every day and fish when he can and has enjoyed watching the [employer] as a good distraction.  He reports that he feels he is managing mood fair ly well.  He has an appointment in 2 weeks.  He does contract for safety having no thoughts of hurting himself or anyone else. We will continue to work on coping skills for helping with his anxiety and the progression of his Lewy body dementia diagnosis as well as processing his grief. 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 Speech/Lang uage:  Normal Rate Affect: Appropriate Mood: normal Thought process: normal Thought content:   WNL Sensory/Perceptual disturbances:  WNL Orientation: oriented to person, place, and situation Attention: Good Concentration: Good Memory: Impaired short term Fund of knowledge:  Good Insight:  Good Judgment:  Good Impulse Control: Good  Risk Assessment: Danger to Self: No Self-injurious Behavior: No Danger to Others: No  Duty to Warn:no Physical Aggression / Violence:No  Access to Firearms a concern: No  Gang Involvement:No  Treatment plan: Will employ cognitive behavioral therapy as well as grief and loss therapy for relief of anxiety and grief.  Goals of grief therapy or to have a healthier response to the loss of his daughter and less sadness as evidenced by patient report in therapy notes as well as to help him feel less overwhelmed by her loss .  Goals are to see a 50% reduction in grief symptoms over the next 6 months.  I will encourage the use of the patient telling his grief story about his daughter including encouraging him to bring pictures and memorabilia to help process his feelings including any feelings of guilt associated with her loss.  We will use cognitive behavioral therapy to help identify and change anxiety producing thoughts and behavior patterns so as to imp rove his ability to better manage anxiety and stress, and manage thoughts and worrisome thinking contributing to anxiety.  We will also refresh  and encourage dialectical behavior therapy distress tolerance and mindfulness skills with the intention of reducing anxiety by 50% over the next 6 months. Progress: 30% Interventions: Cognitive Behavioral Therapy  Diagnosis:PTSD, Bi-Polar D/O  Plan: F/U/ appointments scheduled weekly.  C raig Kelly Patient, [facility]                                                                                                                   Cecile Coder,  [facility]  [place] [facility] Counselor/Therapist Progress Note  Patient ID: Dirck Butch, MRN: [id number],    Date: 04/01/2024 This session was held via video teletherapy. The patient consented to the video teletherapy and was located in his home during this session. He is aw are it is the responsibility of the patient to secure confidentiality on his end of the session. The provider was in a private home office for the duration of this session.   The patient arrived on time for her Caregility session  Time Spent: 58 minutes, 2 PM to 2:58 PM  Treatment Type: Individual Therapy Reported Symptoms: anxiety, panic attacks A fairly difficult week because it was his daughter's birthday.  He said this year  was more  difficult than last year but he could not pinpoint why.  They did remember her about going out to a nice restaurant which is something her daughter always wanted to do.  He said they were able to laugh and joke about funny things that his daughter had said or done which is not something they were able to do this time last year so he saw that as healthy grieving.  He knows that the time change threw things off a little bit for e verybody and his wife still is not feeling well and his back has been bothering him.  He was not able to go to his first round of physical therapy this week because his back was bothering him more.  He has been thinking a lot more about his daughter which he feels comfortable with.  He is thankful that it is getting warmer and staying light longer so he can get back to some of the coping skills such as [place] that [place] has [place]   We will continue to work on coping skills for helping with his anxiety and the progression of his Lewy body dementia diagnosis as well as processing his grief. 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 Speech/Language:  Normal Rate Affect: Appropriate Mood: normal Thought process: normal Thought content:  WNL Sensory/Perceptua l disturbances:  WNL Orientation: oriented to person, place, and situation Attention: Good Concentration: Good Memory: Impaired short term Fund of knowledge:  Good Insight:  Good Judgment:  Good Impulse Control: Good  Risk Assessment: Danger to Self: No Self-injurious Behavior: No Danger to Others: No Duty to Warn:no Physical Aggression / Violence:No  Access to Firearms a concern: No  Gang Involvement:No  Treatment  plan: Will employ cognitive behavioral therapy as well as grief and loss therapy for relief of anxiety and grief.  Goals of grief therapy or to have a healthier response to the loss of his  daughter and less sadness as evidenced by patient report in therapy notes as well as to help him feel less overwhelmed by her loss.  Goals are to see a 50% reduction in grief symptoms over the next 6 months.  I will encourage the use of the patient te lling his grief story about his daughter including encouraging him to bring pictures and memorabilia to help process his feelings including any feelings of guilt associated with her loss.  We will use cognitive behavioral therapy to help identify and change anxiety producing thoughts and behavior patterns so as to improve his ability to better manage anxiety and stress, and manage thoughts and worrisome thinking contributing to anxiety.   We will also refresh and encourage dialectical behavior therapy  distress tolerance and mindfulness skills with the intention of reducing anxiety by 50% over the next 6 months. Progress: 30% Interventions: Cognitive Behavioral Therapy  Diagnosis:PTSD, Bi-Polar D/O  Plan: F/U/ appointments scheduled weekly.  Cecile Coder, [facility]                                                                                                                    Cecile Coder, [facility]               Cecile Coder, [facility]               Cecile Coder, [facility]  [place] [facility] Counselor/Therapist Progress Note  Patient ID: Zerick Prevette, MRN: [id number],    Date: 04/01/2024 This session was held via video teletherapy. The patient consented to the video teletherapy and was located in his home during this session. He is aware it is the responsibility of the patie nt to secure confidentiality on his end of the session. The provider was in a private home office for the duration of this session.   The patient arrived on time for her Caregility session  Time Spent: 1:05 PM until 2 PM, 55 minutes Treatment Type: Individual Therapy Reported Symptoms: anxiety, panic attacks The patient said he came in from his walk this morning and saw what he thought was some sort of tic from his wife.  It was  not the first time it happened and he wonders if it might be tardive dyskinesia because of some new medications that she is on.  She has a call into the doctor right now and hopes to  hear something back soon.  They have narrowed down to the fact they think most of her discomfort in breathing is coming from the diaphragm and they are looking for alternatives for treatment.  The patient continues to do those things which help him cope  including walking, fishing when he can, spending time with his son and daughter-in-law and grandson.  He also has a way of continued coping with grief he is writing letters to his daughters as a way of expressing his feelings and says that is very beneficial.  He appears to be grieving in a healthy way but he knows that he to year anniversary of her death is coming up and there is some grief there but says it is not overwhelming.  We  will continue to work on coping skills for helping with his anxiety and the progression of his Lewy body dementia diagnosis as well as processing his grief. 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 Speech/Language:  Normal Rate Affect: Appropriate Mood: normal Thought process: normal Thought content:  WNL Sensory/Perceptual disturbanc es:  WNL Orientation: oriented to person, place, and situation Attention: Good Concentration: Good Memory: Impaired short term Fund of knowledge:  Good Insight:  Good Judgment:  Good Impulse Control: Good  Risk Assessment: Danger to Self: No Self-injurious Behavior: No Danger to Others: No Duty to Warn:no Physical Aggression / Violence:No  Access to Firearms a concern: No  Gang Involvement:No  Treatment plan: Will e mploy cognitive behavioral therapy as well as grief and loss therapy for relief of anxiety and grief.  Goals of grief therapy or to have a healthier response to the loss of his daughter and less sadness as evidenced by patient report in therapy notes as well as to help him feel less overwhelmed by her loss.  Goals are to see a 50% reduction in grief symptoms over the  next 6 months.  I will encourage the use of the patient telling his gr ief story about his daughter including encouraging him to bring pictures and memorabilia to help process his feelings including any feelings of guilt associated with her loss.  We will use cognitive behavioral therapy to help identify and change anxiety producing thoughts and behavior patterns so as to improve his ability to better manage anxiety and stress, and manage thoughts and worrisome thinking contributing to anxiety.  We will al so refresh and encourage dialectical behavior therapy distress tolerance  and mindfulness skills with the intention of reducing anxiety by 50% over the next 6 months. Progress: 30% Interventions: Cognitive Behavioral Therapy  Diagnosis:PTSD, Bi-Polar D/O  Plan: F/U/ appointments scheduled weekly.  Cecile Coder, [facility]                                                                                                                    Cecile Coder, [facility]  [place] [facility] Counselor/Therapist Progress Note  Patient ID: Rakeem Colley, MRN: [id number],    Date: 04/01/2024 This session was held via video teletherapy. The patient consented to the video teletherapy and was located in his home during this session. He is aware it is the responsibility of the patient to secure confidentiality on his end of the session. The provider was in a private home o ffice for the duration of this session.   The patient arrived on time for her Caregility session  Time Spent: 59 minutes, 2 PM to 2:59 PM  Treatment Type: Individual Therapy Reported Symptoms: anxiety, panic attacks The patient continues to present with physical pain and both his back and his knee.  He started with a new physical therapist and told him that there was a lot of arthritis in both knees and or certain things he cann ot do but they had him do that anyway and now for the past 3 days he has been in significant knee pain.  He has continued to walk because he knows that is good for him in various ways but says it has been painful.  His wife also was not feeling well but she is going back to the doctor next week to have an ablation and hopes that will bring her some relief.  For the most part he has been at the last couple weeks reaching back out and hea ring from some old friends as well as time with his son and son's family.  He says that his wife's cousin is coming down for a weekend soon and they enjoy time with her and that he will  go to his brother's house sometime in the next few weeks.  He recognizes the need for people that he cares about in helping with his depression. We will continue to work on coping skills for helping with his anxiety and the progression of his Lewy body  dementia diagnosis as well as processing his grief. 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 Speech/Language:  Normal Rate Affect: Appropriate Mood: normal Thought process: normal Thought content:  WNL Sensory/Perceptual disturbances:  WNL Orientation: oriented to person, place, and situation Attention: Good Concentration: Good M emory: Impaired short term Fund of knowledge:  Good Insight:  Good Judgment:  Good Impulse Control: Good  Risk Assessment: Danger to Self: No Self-injurious Behavior: No Danger to Others: No Duty to Warn:no Physical Aggression / Violence:No  Access to Firearms a concern: No  Gang Involvement:No  Treatment plan: Will employ cognitive behavioral therapy as well as grief and loss therapy for relief of anxiety and grief.  Go als of grief therapy or to have a healthier response to the loss of his daughter and less sadness as evidenced by patient report in therapy notes as well as to help him feel less overwhelmed by her loss.  Goals are to see a 50% reduction in grief symptoms over the next 6 months.  I will encourage the use of the patient telling his grief story about his daughter including encouraging him to bring pictures and memorabilia to help process  his feelings including any feelings of guilt associated with her loss.  We will use cognitive behavioral therapy to help identify and change anxiety producing thoughts and behavior patterns so as to improve his ability to better manage anxiety and stress, and manage thoughts and worrisome thinking contributing to anxiety.  We will also refresh  and encourage dialectical  behavior therapy distress tolerance and mindfulness skills with the  intention of reducing anxiety by 50% over the next 6 months. Progress: 30% Interventions: Cognitive Behavioral Therapy  Diagnosis:PTSD, Bi-Polar D/O  Plan: F/U/ appointments scheduled weekly.  Cecile Coder, [facility]                                                                                                                    Cecile Coder, [facility]               Cecile Coder, [facility]               Cecile Coder, [facility]               Cecile Coder, [facility]  [place] [facility] Counselor/Therapist  Progress Note  Patient ID: Sayed Apostol, MRN: [id number],    Date: 04/01/2024 This session was held via video teletherapy. The patient consented to the video teletherapy and was located in his home during this session. He is aware it is the responsibility of the patient to secure confidentiality on his end of the session . The provider was in a private home office for the duration of this session.   The patient arrived on time for her Caregility session  Time Spent: 57 minutes, 2 PM to 2:57 PM  Treatment Type: Individual Therapy Reported Symptoms: anxiety, panic attacks The patient is still having significant back pain.  He cannot sit anywhere for very long and has to be careful where he sits.  There is still some unsteadiness although he did no t report any falls over the past 2 weeks.  His biggest concern now is for his wife.  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  She is meeting with her cardiologist again next week to see what possible treatment is for her.  He still thinks there may be some issue with her diaphragm but she is meeting with the pulmonologist also.  He is doing what he can t o help her.  His daughter's birthday is March 11 so he knows that is a difficult time for both he and his wife next week.  Typically they do something to remember her but because of the wife's current health issues they cannot go to [place] as they had planned so they are talking about what to do to remember and celebrate her birthday.  We will continue to work on coping skills for helping with his anxiety and the progression  of his Lewy body dementia diagnosis as well as processing his grief. 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 Speech/Language:  Normal Rate Affect: Appropriate Mood: normal Thought  process: normal Thought content:  WNL Sensory/Perceptual disturbances:  WNL Orientation: oriented to person, place, and situation Attention: Good Conce ntration: Good Memory: Impaired short term Fund of knowledge:  Good Insight:  Good Judgment:  Good Impulse Control: Good  Risk Assessment: Danger to Self: No Self-injurious Behavior: No Danger to Others: No Duty to Warn:no Physical Aggression / Violence:No  Access to Firearms a concern: No  Gang Involvement:No  Treatment plan: Will employ cognitive behavioral therapy as well as grief and loss therapy for relief of anxie ty and grief.  Goals of grief therapy or to have a healthier response to the loss of his daughter and less sadness as evidenced by patient report in therapy notes as well as to help him feel less overwhelmed by her loss.  Goals are to see a 50% reduction in grief symptoms over the next 6 months.  I will encourage the use of the patient telling his grief story about his daughter including encouraging him to bring pictures and memorabilia  to help process his feelings including any feelings of guilt associated with her loss.  We will use cognitive behavioral therapy to help identify and change anxiety producing thoughts and behavior patterns so as to improve his ability to better manage anxiety and stress, and manage thoughts and worrisome thinking contributing to anxiety.  We will also refresh and encourage dialectical behavior therapy distress tolerance and mindfulness  skills with the intention of reducing anxiety by 50% over the next 6 months. Progress: 30% Interventions: Cognitive Behavioral Therapy  Diagnosis:PTSD, Bi-Polar D/O  Plan: F/U/ appointments scheduled weekly.  Cecile Coder, [facility]                                                                                                                     Cecile Coder, [facility]  [place] [facility] Counselor/Therapist Progress Note   Patient ID: Brevyn Ring, MRN: [id number],    Date: 04/01/2024 This session was held via video teletherapy. The patient consented to the video teletherapy and was located in his home during this session. He is aware it is the responsibility of the patient to secure confidentiality on his end of the session. The provider was in a private home office for the duration of this session.   The patient arrived on time for her Caregil ity session  Time Spent: 58 minutes, 2 PM to 2:58 PM  Treatment Type: Individual Therapy Reported Symptoms: anxiety, panic attacks A fairly difficult week because it was his  daughter's birthday.  He said this year was more difficult than last year but he could not pinpoint why.  They did remember her about going out to a nice restaurant which is something her daughter always wanted to do.  He said they were able to laugh and joke a bout funny things that his daughter had said or done which is not something they were able to do this time last year so he saw that as healthy grieving.  He knows that the time change threw things off a little bit for everybody and his wife still is not feeling well and his back has been bothering him.  He was not able to go to his first round of physical therapy this week because his back was bothering him more.  He has been thinking a  lot more about his daughter which he feels comfortable with.  He is thankful that it is getting warmer and staying light longer so he can get back to some of the coping skills such as fishing that he has done in the past.   We will continue to work on coping skills for helping with his anxiety and the progression of his Lewy body dementia diagnosis as well as processing his grief. 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 Speech/Language:  Normal Rate Affect: Appropriate Mood: normal Thought process: normal Thought content:  WNL Sensory/Perceptual disturbances:  WNL Orientation: oriented to person, place, and situation Attention: Good Concentration: Good Memory: Impaired short term Fund of knowledge:  Good Insight:  Good Judgment:  Good Impulse Control:  Good  Risk Assessment: Danger to Self: No Self-injurious Behavior: No Danger to Others: No Duty to Warn:no Physical Aggression / Violence:No  Access to Firearms a concern: No  Gang Involvement:No  Treatment plan: Will employ cognitive behavioral therapy as well as grief and loss therapy for relief of anxiety and grief.  Goals of grief therapy or to  have a healthier response to the loss of his daughter and less sadness as evi denced by patient report in therapy notes as well as to help him feel less overwhelmed by her loss.  Goals are to see a 50% reduction in grief symptoms over the next 6 months.  I will encourage the use of the patient telling his grief story about his daughter including encouraging him to bring pictures and memorabilia to help process his feelings including any feelings of guilt associated with her loss.  We will use cognitive behavioral  therapy to help identify and change anxiety producing thoughts and behavior patterns so as to improve his ability to better manage anxiety and stress, and manage thoughts and worrisome thinking contributing to anxiety.  We will also refresh and encourage dialectical behavior therapy  distress tolerance and mindfulness skills with the intention of reducing anxiety by 50% over the next 6 months. Progress: 30% Interventions: Cognitive Be havioral Therapy  Diagnosis:PTSD, Bi-Polar D/O  Plan: F/U/ appointments scheduled weekly.  Cecile Coder, [facility]                                                                                                                   Cecile Coder, [facility]               Cecile Coder, [facility]                Cecile Coder, [facility]  [place] [facility] Counselor/Therapist Progress Note  Patient ID: Kip Cropp d, MRN: [id number],    Date: 04/01/2024 This session was held via video teletherapy. The patient consented to the video teletherapy and was located in his home during this session. He is aware it is the responsibility of the patient to secure confidentiality on his end of the session. The provider was in a private home office for the duration of this session.   The patient arrived on time for her Caregility session  Time Spent: 57  minutes, 2 PM to 2:57 PM  Treatment Type: Individual Therapy Reported Symptoms: anxiety, panic attacks The patient is still having significant back pain.  He cannot sit anywhere for very long and has to be careful where he sits.  There is still some unsteadiness although he did not report any falls over the past 2 weeks.  His biggest concern now is for his wife.  She has been having some difficulty breathing and they did a test so t hey found a small hole on the back side of her heart.  She is meeting with her cardiologist again next week to see what possible treatment is for her.  He still thinks there may be some  issue with her diaphragm but she is meeting with the pulmonologist also.  He is doing what he can to help her.  His daughter's birthday is March 11 so he knows that is a difficult time for both he and his wife next week.  Typically they do something to  remember her but because of the wife's current health issues they cannot go to [place] as they had planned so they are talking about what to do to remember and celebrate her birthday.  We will continue to work on coping skills for helping with his anxiety and the progression of his Lewy body dementia diagnosis as well as processing his grief. Mental Status Exam: Appearance:  Well Groomed  Behavior: Appropriate Motor: Pt.  Reports some instability due to Lewey Body dementia Speech/Language:  Normal Rate Affect: Appropriate Mood: normal Thought process: normal Thought content:  WNL Sensory/Perceptual disturbances:  WNL Orientation: oriented to person, place, and situation Attention: Good Concentration: Good Memory: Impaired short term Fund of knowledge:  Good Insight:  Good Judgment:  Good Impulse Control: Good  Risk Assessment: Danger  to Self: No Self-injurious Behavior: No Danger to Others: No Duty to Warn:no Physical Aggression / Violence:No  Access to Firearms a concern: No  Gang Involvement:No  Treatment plan: Will employ cognitive behavioral therapy as well as grief and loss therapy for relief of anxiety and grief.  Goals of grief therapy or to have a healthier response to the loss of his daughter and less sadness as evidenced by patient report in therap y notes as well as to help him feel less overwhelmed by her loss.  Goals are to see a 50% reduction in grief symptoms over the next 6 months.  I will encourage the use of the patient telling his grief story about his daughter including encouraging him to bring pictures and memorabilia to help process his feelings including any feelings of guilt associated with her  loss.  We will use cognitive behavioral therapy to help identify and chan ge anxiety producing thoughts and behavior patterns so as to improve his ability to better manage anxiety and stress, and manage thoughts and worrisome thinking contributing to anxiety.  We will also refresh and encourage dialectical behavior therapy distress tolerance and mindfulness  skills with the intention of reducing anxiety by 50% over the next 6 months. Progress: 30% Interventions: Cognitive Behavioral Therapy  Diagnosis:PTSD , Bi-Polar D/O  Plan: F/U/ appointments scheduled weekly.  Cecile Coder, [facility]                                                                                                                   Cecile Coder, [facility]  [place] [facility] Counselor/Therapist Progress Note  Patient ID: Travelle Mcclimans, MRN: [id number],    Date: 04/01/2024 This session was held via video teletherapy. The patient consented to the video telet herapy and was located in his home during this session. He is aware it is the responsibility of the patient to secure confidentiality on his end of the session. The provider was in a private home office for the duration of this session.   The patient arrived on time for her Caregility session  Time Spent: 57 minutes, 2 PM to 2:57 PM  Treatment Type: Individual Therapy Reported Symptoms: anxiety, panic attacks  The patient repor ted that he had not been sleeping well so he did speak with his psychiatrist about adjusting some meds.  Initially they adjusted too much up and he said he felt groggy for the entire next day but they have found a combination now where he feels like he is getting more rested and only feels a little drowsy shortly after waking up..  There have been a couple times where he stumbled and one of the times he felt down but did not get hurt th e other times he was able to catch himself.  He is trying to stay as active as he can walking daily and fishing with his wife when he can.  His biggest anxiety now is his wife has an upcoming surgery on her diaphragm in 2 weeks so he knows that will be a fairly significant recovery time but also sees the difficulties she is having now and wants her to get some relief..  His son and daughter-in-law will be supports for them also.  He con tinues to speak with friends and family members as encouragement.  He also feels that he is grieving appropriately writing to his daughter on a regular basis about the things that are  going on in his and his wife's lives.  We will continue to work on coping skills for helping with his anxiety and the progression of his Lewy body dementia diagnosis as well as processing his grief. Mental Status Exam: Appearance:  Well Groomed  Beh avior: Appropriate Motor: Pt. Reports some instability due to Lewey Body dementia Speech/Language:  Normal Rate Affect: Appropriate Mood: normal Thought process: normal Thought content:  WNL Sensory/Perceptual disturbances:  WNL Orientation: oriented to person, place, and situation Attention: Good Concentration: Good Memory: Impaired short term Fund of knowledge:  Good Insight:  Good Judgment:  Good Impulse Control: Go od  Risk Assessment: Danger to Self: No Self-injurious Behavior: No Danger to Others: No Duty to Warn:no Physical Aggression / Violence:No  Access to Firearms a concern: No  Gang Involvement:No  Treatment plan: Will employ cognitive behavioral therapy as well as grief and loss therapy for relief of anxiety and grief.  Goals of grief therapy or to have a healthier response to the loss of his daughter and less sadness as eviden ced by patient report in therapy notes as well as to help him feel less overwhelmed by her loss.  Goals are to see a 50% reduction in grief symptoms over the next 6 months.  I will encourage the use of the patient telling his grief story about his daughter including encouraging him to bring pictures and memorabilia to help process his feelings including any feelings of guilt associated with her loss.  We will use cognitive behavioral th erapy to help identify and change anxiety producing thoughts and behavior patterns so as to improve his ability to  better manage anxiety and stress, and manage thoughts and worrisome thinking contributing to anxiety.  We will also refresh and encourage dialectical behavior therapy distress tolerance and mindfulness skills with the intention of reducing anxiety by 50%  over the next 6 months. Progress: 30% Interventions: Cognitive Behav ioral Therapy  Diagnosis:PTSD, Bi-Polar D/O  Plan: F/U/ appointments scheduled weekly.  Cecile Coder, [facility]                                                                                                                   Cecile Coder, [facility]               Cecile Coder, [facility]                Cecile Coder, [facility]               Cecile Coder, [facility]               Cecile Coder, [facility]               Cecile Coder, [facility]               Cecile Coder, [facility]               Cecile Coder, [facility]  [place] [facility] Counselor/Therapist Progress Note  Patient ID: Coral Soler, MRN: [id number],    Date: 04/01/2024 This session was held via video teletherapy. The patient consented to the video teletherapy and was located in his home during this session. He is  aware it is the responsibility of the patient to secure confidentiality on his end of the session. The provider was in a private home office for the duration of this session.   The patient arrived on time for her Caregility session  Time Spent: 59 minutes, 2 PM to 2:59 PM  Treatment Type: Individual Therapy Reported Symptoms: anxiety, panic attacks The patient said he jokingly said to his wife that he had not had any pain issues  in the cervical area and then a few days ago he started having pain in his neck and shoulders.  He now sits related to degenerative disk and has some consistent pain somewhere in his spine with that but jokingly says it just depends on Wednesday which part of his body is decides to show up and hurt.  He is doing the best that he can.  He is still walking which he knows is good for him but does not have right now the strength to do as m uch in the morning as he has done in the past so he is denying that to 3 times a day for consistency.  He is still doing some positive things and that he is staying in touch with his brother and other family members.  He is reconnecting with friends from high school and he is which she is enjoying.  One of them has had a similar experience in that she lost her son in the same way the patient lost his daughter.  He said they did not thin k and Easter in which they set a place for his daughter at the table and put her picture there.  He initially had reservations about doing that he and his wife had made everyone feel as  if she was really there with him.  He still feels that he is grieving in a healthy way.  He and his wife have always taken a trip around both of their birthdays which are 2 days apart.  That is now close to his daughter died so they have started taking s ome of her rashes with him and sprinkling them where they go as a way of remembrance.  They are deciding what that looks like this year in July.  He does contract for safety having no thoughts of hurting himself or anyone else. We will continue to work on coping skills for helping with his anxiety and the progression of his Lewy body dementia diagnosis as well as processing his grief. 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 Speech/Language:  Normal Rate Affect: Appropriate Mood: normal Thought process: normal Thought content:  WNL Sensory/Perceptual disturbances:  WNL Orientation: oriented to person, place, and situation Attention: Good Concentration: Good Memory: Impaired short term Fund of knowledge:  Good Insight:  Good Judgment:  Good Impulse Contr ol: Good  Risk Assessment: Danger to Self: No Self-injurious Behavior: No Danger to Others: No Duty to Warn:no Physical Aggression / Violence:No  Access to Firearms a concern: No  Gang Involvement:No  Treatment plan: Will employ cognitive behavioral therapy as well as grief and loss therapy for relief of anxiety and grief.  Goals of grief therapy or  to have a healthier response to the loss of his daughter and less sadness as  evidenced by patient report in therapy notes as well as to help him feel less overwhelmed by her loss.  Goals are to see a 50% reduction in grief symptoms over the next 6 months.  I will encourage the use of the patient telling his grief story about his daughter including encouraging him to bring pictures and memorabilia to help process his feelings including any  feelings of guilt associated with her loss.  We will use cognitive behavio ral therapy to help identify and change anxiety producing thoughts and behavior patterns so as to improve his ability to better manage anxiety and stress, and manage thoughts and worrisome thinking contributing to anxiety.  We will also refresh and encourage dialectical behavior therapy distress tolerance and mindfulness skills with the intention of reducing anxiety by 50% over the next 6 months. Progress: 30% Interventions: Cognitive  Behavioral Therapy  Diagnosis:PTSD, Bi-Polar D/O  Plan: F/U/ appointments scheduled weekly.  Cecile Coder, [facility]                                                                                                                   Cecile Coder, [facility]  [place] [facility] Counselor/Therapist Progress Note  Patient ID: Haim Hansson, MRN: [id number],    Date: 04/01/2024 This session was held via video teletherapy. The  patient consented to the video teletherapy and was located in his home during this session. He is aware it is the responsibility of the patient to secure confidentiality on his end of the session. The provider was in a private home office for the duration of this session.   The patient arrived on time for her Caregility session  Time Spent: 58 minutes, 2 PM to 2:58 PM  Treatment Type: Individual Therapy Reported Symptoms: anxiet y, panic attacks A fairly difficult week because it was his daughter's birthday.  He said this year was more difficult than last year but he could not pinpoint why.  They did remember her about going out to a nice restaurant which is something her daughter always wanted to do.  He said they were able to laugh and joke about funny things that his daughter had said or done which is not something they were able to do this time last year s o he saw that as healthy grieving.  He knows that the time change threw things off a little bit for everybody and his wife still is not feeling well and his back has been bothering him.  He was not able to go to his first round of physical therapy this week because his back was bothering him more.  He has been thinking a lot more about his daughter which he feels comfortable with.  He is thankful that it is getting warmer and staying li ght longer so he can get back to some of the coping skills such as fishing that he has done in the past.   We will continue to work on coping skills for helping with his anxiety and  the progression of his Lewy body dementia diagnosis as well as processing his grief. 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 Speech/Language:  Normal Rate  Affect: Appropriate Mood: normal Thought process: normal Thought content:  WNL Sensory/Perceptual disturbances:  WNL Orientation: oriented to person, place, and situation Attention: Good Concentration: Good Memory: Impaired short term Fund of knowledge:  Good Insight:  Good Judgment:  Good Impulse Control: Good  Risk Assessment: Danger to Self: No Self-injurious Behavior: No Danger to Others: No Duty to Warn:no Ph ysical Aggression / Violence:No  Access to Firearms a concern: No  Gang Involvement:No  Treatment plan: Will employ cognitive behavioral therapy as well as grief and loss therapy for relief of anxiety and grief.  Goals of grief therapy or to have a healthier response to the loss of his daughter and less sadness as evidenced by patient report in therapy notes as well as to help him feel less overwhelmed by her loss.  Goals are to see  a 50% reduction in grief symptoms over the next 6 months.  I will encourage the use of the patient telling his grief story about his daughter including encouraging him to bring pictures and memorabilia to help process his feelings including any feelings of guilt associated with her loss.  We will use cognitive behavioral therapy to help identify and change anxiety producing thoughts and behavior patterns so as to improve his ability to  better manage anxiety and stress, and manage thoughts and worrisome thinking contributing to anxiety.  We will also refresh and encourage dialectical behavior therapy distress  tolerance and mindfulness skills with the intention of reducing anxiety by 50% over the next 6 months. Progress: 30% Interventions: Cognitive Behavioral Therapy  Diagnosis:PTSD, Bi-Polar  D/O  Plan: F/U/ appointments scheduled weekly.  Cecile Coder, [facility]                                                                                                                    Cecile Coder, [facility]               Cecile Coder, [facility]               Cecile Coder, [facility]  [place] [facility] Counselor/Therapist Progress Note  Patient ID: Dontario Evetts, MRN: [id number],    Date: 04/01/2024 This session was held via video teletherapy. The patient consented to the video telethera py and was located in his home during this session. He is aware it is the responsibility of the patient to  secure confidentiality on his end of the session. The provider was in a private home office for the duration of this session.   The patient arrived on time for her Caregility session  Time Spent: 1:05 PM until 2 PM, 55 minutes Treatment Type: Individual Therapy Reported Symptoms: anxiety, panic attacks The patient said he c ame in from his walk this morning and saw what he thought was some sort of tic from his wife.  It was not the first time it happened and he wonders if it might be tardive dyskinesia because of some new medications that she is on.  She has a call into the doctor right now and hopes to hear something back soon.  They have narrowed down to the fact they think most of her discomfort in breathing is coming from the diaphragm and they are loo king for alternatives for treatment.  The patient continues to do those things which help him cope including walking, fishing when he can, spending time with his son and daughter-in-law and grandson.  He also has a way of continued coping with grief he is writing letters to his daughters as a way of expressing his feelings and says that is very beneficial.  He appears to be grieving in a healthy way but he knows that he to year annive rsary of her death is coming up and there is some grief there but says it is not overwhelming.  We will continue to work on coping skills for helping with his anxiety and the progression of his Lewy body dementia diagnosis as well as processing his grief. 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 Speech/Language:  Normal Rate Affect: Appr opriate Mood: normal Thought process: normal Thought content:  WNL Sensory/Perceptual disturbances:  WNL Orientation: oriented to person, place, and situation Attention: Good Concentration: Good Memory: Impaired short term Fund of knowledge:  Good Insight:  Good Judgment:   Good Impulse Control: Good  Risk Assessment: Danger to Self: No Self-injurious Behavior: No Danger to Others: No Duty to Warn:no Physical Aggre ssion / Violence:No  Access to Firearms a concern: No  Gang Involvement:No  Treatment plan: Will employ cognitive behavioral therapy as well as grief and loss therapy for relief of anxiety and grief.  Goals of grief therapy or to have a healthier response to the loss of his daughter and less sadness as evidenced by patient report in therapy notes as well as to help him feel less overwhelmed by her loss.  Goals are to see a 50% reduct ion in grief symptoms over the next 6 months.  I will encourage the use of the patient telling his grief story about his daughter including encouraging him to bring pictures and memorabilia to help process his feelings including any feelings of guilt associated with her loss.  We will use cognitive behavioral therapy to help identify and change anxiety producing thoughts and behavior patterns so as to improve his ability to better manag e anxiety and stress, and manage thoughts and worrisome thinking contributing to anxiety.  We will also refresh and encourage dialectical behavior therapy distress tolerance  and mindfulness skills with the intention of reducing anxiety by 50% over the next 6 months. Progress: 30% Interventions: Cognitive Behavioral Therapy  Diagnosis:PTSD, Bi-Polar D/O  Plan: F/U/ appointments scheduled weekly.  Cecile Coder, [facility]                                                                                                                    Cecile Coder, [facility]  [place] [facility] Counselor/Therapist Progress Note  Patient ID: Adalid Beckmann, MRN: [id number],    Date: 04/01/2024 This session was held via video teletherapy. The patient consented to the video teletherapy and was located in his home during this session. He is aware it is the responsibility of  the patient to secure confidentiality on his end of the session. The provider was in a private home office for the duration of this session.   The patient arrived on time for her Caregility session  Time Spent: 59 minutes, 2 PM to 2:59 PM  Treatment Type: Individual Therapy Reported Symptoms: anxiety, panic attacks The patient continues to present with physical pain and both his back and his knee.  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  He has continued to walk because he knows that is good for him in various ways but says it has been painful.  His wife also was not feeling well but she is going back to the doctor next week to have an ablation and hopes that will bri ng her some relief.  For the most part he has been at the last couple weeks reaching back out and hearing from some old friends as well as time with his son and son's family.  He says that his wife's cousin is coming down for a weekend soon and they enjoy time with her and that he will go to his brother's house sometime in the next few weeks.  He recognizes the need for people that he cares about in helping with his depression. We will  continue to work on coping skills for helping with his anxiety and the progression of his Lewy body dementia diagnosis as well as processing his grief. 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 Speech/Language:  Normal Rate Affect: Appropriate Mood: normal Thought process: normal Thought content:  WNL Sensory/Perceptual disturbances:   WNL Orientation: oriented to person, place, and situation Attention: Good Concentration: Good Memory: Impaired short term Fund of knowledge:  Good Insight:  Good Judgment:  Good Impulse Control: Good  Risk Assessment: Danger to Self: No Self-injurious Behavior: No Danger to Others: No Duty to Warn:no Physical Aggression / Violence:No  Access to Firearms a concern: No  Gang Involvement:No  Treatment plan: Will emplo y cognitive behavioral therapy as well as grief and loss therapy for relief of anxiety and grief.  Goals of grief therapy or to have a healthier response to the loss of his daughter and less sadness as evidenced by patient report in therapy notes as well as to help him feel less overwhelmed by her loss.  Goals are to see a 50% reduction in grief symptoms over the  next 6 months.  I will encourage the use of the patient telling his grief  story about his daughter including encouraging him to bring pictures and memorabilia to help process his feelings including any feelings of guilt associated with her loss.  We will use cognitive behavioral therapy to help identify and change anxiety producing thoughts and behavior patterns so as to improve his ability to better manage anxiety and stress, and manage thoughts and worrisome thinking contributing to anxiety.  We will also  r efresh and encourage dialectical behavior therapy distress tolerance and mindfulness skills with the intention of reducing anxiety by 50% over the next 6 months. Progress: 30% Interventions: Cognitive Behavioral Therapy  Diagnosis:PTSD, Bi-Polar D/O  Plan: F/U/ appointments scheduled weekly.  Cecile Coder, [facility]                                                                                                                    Cecile Coder, [facility]               Cecile Coder, [facility]               Cecile Coder, [facility]               Cecile Coder, [facility]  [place] [facility] Counselor/Therapist Progress Note  Patient ID: Jemarcus Dougal, MRN: [id number],    Date: 04/01/2024 This session was held via video teletherapy. The patient consented to the video teletherapy and was located in his home during this session. He  is aware it is the responsibility of the patient to secure confidentiality on his end of the session. The provider was in a private home office for the duration of this session.   The patient arrived on time for her Caregility session  Time Spent: 57 minutes, 2 PM to 2:57 PM  Treatment Type: Individual Therapy Reported Symptoms: anxiety, panic attacks The patient is still having significant back pain.  He cannot sit anywhere fo r very long and has to be careful where he sits.  There is still some unsteadiness although he did not report any falls over the past 2 weeks.  His biggest concern now is for his wife.  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  She is meeting with her cardiologist again next week to see what possible treatment is for her.  He still thinks there may be som e issue with her diaphragm but she is meeting with the pulmonologist also.  He is doing what he can to help her.  His daughter's birthday is March 11 so he knows that is a difficult  time for both he and his wife next week.  Typically they do something to remember her but because of the wife's current health issues they cannot go to [place] as they had planned so they are talking about what to do to remember and celebrate her bir thday.  We will continue to work on coping skills for helping with his anxiety and the progression of his Lewy body dementia diagnosis as well as processing his grief. 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 Speech/Language:  Normal Rate Affect: Appropriate Mood: normal Thought process: normal Thought content:  WNL Sensory/Perceptu al disturbances:  WNL Orientation: oriented to person, place, and situation Attention: Good Concentration: Good Memory: Impaired short term Fund of knowledge:  Good Insight:  Good Judgment:  Good Impulse Control: Good  Risk Assessment: Danger to Self: No Self-injurious Behavior: No Danger to Others: No Duty to Warn:no Physical Aggression / Violence:No  Access to Firearms a concern: No  Gang Involvement:No  Treatment  plan: Will employ cognitive behavioral therapy as well as grief and loss therapy for relief of anxiety and grief.  Goals of grief therapy or to have a healthier response to the loss of his daughter and less sadness as evidenced by patient report in therapy notes as well as to help him feel less overwhelmed by her loss.  Goals are to see a 50% reduction in grief symptoms over the next 6 months.  I will encourage the use of the patient t elling his grief story about his daughter including encouraging him to bring pictures and memorabilia to help process his feelings including any feelings of guilt associated with her loss.  We will use cognitive behavioral therapy to help identify and change anxiety producing thoughts and behavior patterns so as to improve his ability to better manage anxiety and  stress, and manage thoughts and worrisome thinking contributing to anxiety .  We will also refresh and encourage dialectical behavior therapy distress tolerance and mindfulness  skills with the intention of reducing anxiety by 50% over the next 6 months. Progress: 30% Interventions: Cognitive Behavioral Therapy  Diagnosis:PTSD, Bi-Polar D/O  Plan: F/U/ appointments scheduled weekly.  Cecile Coder, [facility]                                                                                                                    Cecile Coder, [facility]  [place] [facility] Counselor/Therapist Progress Note  Patient  ID: Marinus Eicher, MRN: [id number],    Date: 04/01/2024 This session was held via video teletherapy. The patient consented to the video teletherapy and was located in his home during this session. He is aware it is the responsibility of the patient to secure confidentiality on his end of the session. The provider was in a p rivate home office for the duration of this session.   The patient arrived on time for her Caregility session  Time Spent: 58 minutes, 2 PM to 2:58 PM  Treatment Type: Individual Therapy Reported Symptoms: anxiety, panic attacks A fairly difficult week because it was his daughter's birthday.  He said this year was more difficult than last year but he could not pinpoint why.  They did remember her about going out to a nice restau rant which is something her daughter always wanted to do.  He said they were able to laugh and joke about funny things that his daughter had said or done which is not something they were able to do this time last year so he saw that as healthy grieving.  He knows that the time change threw things off a little bit for everybody and his wife still is not feeling well and his back has been bothering him.  He was not able to go to his first  round of physical therapy this week because his back was bothering him more.  He has been thinking a lot more about his daughter which he feels comfortable with.  He is thankful that it is getting warmer and staying light longer so he can get back to some of the coping skills such as fishing that he has done in the past.   We will continue to work on coping skills for helping with his anxiety and the progression of his Lewy body dem entia diagnosis as well as processing his grief. 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 Speech/Language:  Normal Rate Affect: Appropriate Mood: normal Thought process: normal Thought content:   WNL Sensory/Perceptual disturbances:  WNL Orientation: oriented to person, place, and situation Attention: Good Concentration: Good Memo ry: Impaired short term Fund of knowledge:  Good Insight:  Good Judgment:  Good Impulse Control: Good  Risk Assessment: Danger to Self: No Self-injurious Behavior: No Danger to Others: No Duty to Warn:no Physical Aggression / Violence:No  Access to Firearms a concern: No  Gang Involvement:No  Treatment plan: Will employ cognitive behavioral therapy as well as grief and loss therapy for relief of anxiety and grief.  Goals  of grief therapy or to have a healthier response to the loss of his daughter and less sadness as evidenced by patient report in therapy notes as well as to help him feel less overwhelmed by her loss.  Goals are to see a 50% reduction in grief symptoms over the next 6 months.  I will encourage the use of the patient telling his grief story about his daughter including encouraging him to bring pictures and memorabilia to help process his  feelings including any feelings of guilt associated with her loss.  We will use cognitive behavioral therapy to help identify and change anxiety producing thoughts and behavior patterns so as to improve his ability to better manage anxiety and stress, and manage thoughts and worrisome thinking contributing to anxiety.  We will also refresh and encourage dialectical behavior therapy distress  tolerance and mindfulness skills with the int ention of reducing anxiety by 50% over the next 6 months. Progress: 30% Interventions: Cognitive Behavioral Therapy  Diagnosis:PTSD, Bi-Polar D/O  Plan: F/U/ appointments scheduled weekly.  Cecile Coder, [facility]                                                                                                                    Cecile Coder,  [facility]               Cecile Coder, [facility]               Cecile Coder, [facility]   Behavioral Health Counselor/Therapist Progress Note  Patient ID: Renard Caperton, MRN: [id number],    Date: 04/01/2024 This session was held via video teletherapy. The patient consented to the video teletherapy and was located in his home during this session. He is aware it is the responsibility of the patient to secure confidentiality on his end of the session. The provider was in a private home office for the du ration of this session.   The patient arrived on time for her Caregility session  Time Spent: 57 minutes, 2 PM to 2:57 PM  Treatment Type: Individual Therapy Reported Symptoms: anxiety, panic  attacks The patient is still having significant back pain.  He cannot sit anywhere for very long and has to be careful where he sits.  There is still some unsteadiness although he did not report any falls over the past 2 weeks.  His biggest  concern now is for his wife.  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  She is meeting with her cardiologist again next week to see what possible treatment is for her.  He still thinks there may be some issue with her diaphragm but she is meeting with the pulmonologist also.  He is doing what he can to help her.  His daughter's birthday is March 11 so  he knows that is a difficult time for both he and his wife next week.  Typically they do something to remember her but because of the wife's current health issues they cannot go to [place] as they had planned so they are talking about what to do to remember and celebrate her birthday.  We will continue to work on coping skills for helping with his anxiety and the progression of his Lewy body dementia diagnosis as well as process ing his grief. 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 Speech/Language:  Normal Rate Affect: Appropriate Mood: normal Thought process: normal Thought content:  WNL Sensory/Perceptual disturbances:  WNL Orientation: oriented to person, place, and situation Attention: Good Concentration: Good Memory: Impaired short term Fund of k nowledge:  Good Insight:  Good Judgment:  Good Impulse Control: Good  Risk Assessment: Danger to Self: No Self-injurious Behavior: No Danger to Others: No Duty to Warn:no Physical Aggression / Violence:No  Access to Firearms a concern: No  Gang Involvement:No  Treatment plan: Will employ cognitive behavioral therapy as well as grief and loss therapy for relief of  anxiety and grief.  Goals of grief therapy or to have a hea lthier response to the loss of his daughter and less sadness as evidenced by patient report in therapy notes as well as to help him feel less overwhelmed by her loss.  Goals are to see a 50% reduction in grief symptoms over the next 6 months.  I will encourage the use of the patient telling his grief story about his daughter including encouraging him to bring pictures and memorabilia to help process his feelings including any feelings o f guilt associated with her loss.  We will use cognitive behavioral therapy to help identify and change anxiety producing thoughts and behavior patterns so as to improve his ability to better manage anxiety and stress, and manage thoughts and worrisome thinking contributing to anxiety.  We will also refresh and encourage dialectical behavior therapy distress tolerance and mindfulness skills  with the intention of reducing anxiety by 50%  over the next 6 months. Progress: 30% Interventions: Cognitive Behavioral Therapy  Diagnosis:PTSD, Bi-Polar D/O  Plan: F/U/ appointments scheduled weekly.  Cecile Coder, [facility]                                                                                                                   Cecile Coder, [facility]  [facility] Counselor/Therapist Progress Note  Patient ID: Saunders [place], MRN: [id number],    D ate: 04/01/2024 This session was held via video teletherapy. The patient consented to the video teletherapy and was located in his home during this session. He is aware it is the responsibility of the patient to secure confidentiality on his end of the session. The provider was in a private home office for the duration of this session.   The patient arrived on time for her Caregility session  Time Spent: 57 minutes, 2 PM to 2:57 PM   Treatment Type: Individual Therapy Reported Symptoms: anxiety, panic attacks  We will continue to work on coping skills for helping with his anxiety and the progression of his Lewy body dementia diagnosis as well as processing his grief. 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 Speech/Language:  Normal Rate Affect: Appropriate Mood:  normal Thought process: normal Thought content:  WNL Sensory/Perceptual disturbances:  WNL Orientation: oriented to person, place, and  situation Attention: Good Concentration: Good Memory: Impaired short term Fund of knowledge:  Good Insight:  Good Judgment:  Good Impulse Control: Good  Risk Assessment: Danger to Self: No Self-injurious Behavior: No Danger to Others: No Duty to Warn:no Physical Aggression / Violen ce:No  Access to Firearms a concern: No  Gang Involvement:No  Treatment plan: Will employ cognitive behavioral therapy as well as grief and loss therapy for relief of anxiety and grief.  Goals of grief therapy or to have a healthier response to the loss of his daughter and less sadness as evidenced by patient report in therapy notes as well as to help him feel less overwhelmed by her loss.  Goals are to see a 50% reduction in grief s ymptoms over the next 6 months.  I will encourage the use of the patient telling his grief story about his daughter including encouraging him to bring pictures and memorabilia to help process his feelings including any feelings of guilt associated with her loss.  We will use cognitive behavioral therapy to help identify and change anxiety producing thoughts and behavior patterns so as to improve his ability to better manage anxiety and  stress, and manage thoughts and worrisome thinking contributing to anxiety.  We will also refresh and encourage dialectical behavior therapy distress tolerance and mindfulness skills with the intention of reducing anxiety by 50% over the next 6 months. Progress: 30% Interventions: Cognitive Behavioral Therapy  Diagnosis:PTSD, Bi-Polar D/O  Plan: F/U/ appointments scheduled weekly.  Cecile Coder, [facility]                                                                                                                    Cecile Coder, [facility]               Cecile Coder, [facility]               Cecile Coder,  [facility]  Cecile Coder, [facility]               Cecile Coder, [facility]                Cecile Coder, [facility]               Cecile Coder, [facility]  [place] [place] [facility] elor/Therapist Progress Note  Patient ID: Kier Smead, MRN: [id number],    Date: 04/01/2024 This session was held via video teletherapy. The patient consented to the video teletherapy and was located in his home during this session. He is aware it is the responsibility of the patient to secure confidentiality on his end of the session. The provider was in a private home office for the duration of this session.   The patient  arrived on time for her Caregility session  Time Spent: 39  minutes, 2 PM to 2:39 PM  Treatment Type: Individual Therapy Reported Symptoms: anxiety, panic attacks The patient was not feeling well today.  He started feeling really earlier in the week having a fever bad headaches body aches.  He felt a little better yesterday but feels a little bit of a resurgence in headaches and body aches and is running a low-grade fever.  His wife  is feeling some of the same symptoms.  For the most part he says he is doing well.  They did increase his memory medication because they were starting to see some increasing decline in short-term memory and he said until recently his long-term memory had been good but he was starting to see a difference in how he used to markers to remember what happened at certain times historically.  He feels the medication has helped and has brighte ned his mood a little also.  He is still trying to walk every day and fish when he can and has enjoyed watching the [employer] as a good distraction.  He reports that he feels he is managing mood fairly well.  He has an appointment in 2 weeks.  He does contract for safety having no thoughts of hurting himself or anyone else. We will continue to work on coping skills for helping with his anxiety and the progression of his  Lewy body dementia diagnosis as well as processing his grief. 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 Speech/Language:  Normal Rate Affect: Appropriate Mood: normal Thought process: normal Thought content:  WNL Sensory/Perceptual disturbances:  WNL Orientation: oriented to person, place, and situation Attention: Good Concentrati on: Good Memory: Impaired short term Fund of knowledge:  Good Insight:  Good Judgment:  Good Impulse Control: Good  Risk Assessment: Danger to Self: No Self-injurious Behavior: No Danger to Others: No Duty to Warn:no Physical  Aggression / Violence:No  Access to Firearms a concern: No  Gang Involvement:No  Treatment plan: Will employ cognitive behavioral therapy as well as grief and loss therapy for relief of anxiety and  grief.  Goals of grief therapy or to have a healthier response to the loss of his daughter and less sadness as evidenced by patient report in therapy notes as well as to help him feel less overwhelmed by her loss.  Goals are to see a 50% reduction in grief symptoms over the next 6 months.  I will encourage the use of the patient telling his grief story about his daughter including encouraging him to bring pictures and memorabilia to he lp process his feelings including any feelings of guilt associated with her loss.  We will use cognitive behavioral therapy to help identify and change anxiety producing thoughts and behavior patterns so as to improve his ability to better manage anxiety and stress, and manage thoughts and worrisome thinking contributing to anxiety.  We will also refresh  and encourage dialectical behavior therapy distress tolerance and mindfulness skill s with the intention of reducing anxiety by 50% over the next 6 months. Progress: 30% Interventions: Cognitive Behavioral Therapy  Diagnosis:PTSD, Bi-Polar D/O  Plan: F/U/ appointments scheduled weekly.  Cecile Coder, [facility]                                                                                                                    Cecile Coder,  [facility]  [place] Counselor/Therapist Progress Note  Patie nt ID: Farris Geiman, MRN: [id number],    Date: 04/01/2024 This session was held via video teletherapy. The patient consented to the video teletherapy and was located in his home during this session. He is aware it is the responsibility of the patient to secure confidentiality on his end of the session. The provider was in a private home office for the duration of this session.   The patient arrived on time for her Caregility se ssion  Time Spent: 58 minutes, 2 PM to 2:58 PM  Treatment Type: Individual Therapy Reported Symptoms: anxiety, panic attacks A fairly difficult week because it was his daughter's birthday.  He said this year was more  difficult than last year but he could not pinpoint why.  They did remember her about going out to a nice restaurant which is something her daughter always wanted to do.  He said they were able to laugh and joke about f unny things that his daughter had said or done which is not something they were able to do this time last year so he saw that as healthy grieving.  He knows that the time change threw things off a little bit for everybody and his wife still is not feeling well and his back has been bothering him.  He was not able to go to his first round of physical therapy this week because his back was bothering him more.  He has been thinking a lot m ore about his daughter which he feels comfortable with.  He is thankful that it is getting warmer and staying light longer so he can get back to some of the coping skills such as fishing that he has done in the past.   We will continue to work on coping skills for helping with his anxiety and the progression of his Lewy body dementia diagnosis as well as processing his grief. Mental Status Exam: Appearance:  Well Groomed  Behavi or: Appropriate Motor: Pt. Reports some instability due to Lewey Body dementia Speech/Language:  Normal Rate Affect: Appropriate Mood: normal Thought process: normal Thought content:  WNL Sensory/Perceptual disturbances:  WNL Orientation: oriented to person, place, and situation Attention: Good Concentration: Good Memory: Impaired short term Fund of knowledge:  Good Insight:  Good Judgment:  Good Impulse Control: Good   Risk Assessment: Danger to Self: No Self-injurious Behavior: No Danger to Others: No Duty to Warn:no Physical Aggression / Violence:No  Access to Firearms a concern: No  Gang Involvement:No  Treatment plan: Will employ cognitive behavioral therapy as well as grief and loss therapy for relief of anxiety and grief.  Goals of grief therapy or to have a healthier response to the loss of his  daughter and less sadness as evidenced  by patient report in therapy notes as well as to help him feel less overwhelmed by her loss.  Goals are to see a 50% reduction in grief symptoms over the next 6 months.  I will encourage the use of the patient telling his grief story about his daughter including encouraging him to bring pictures and memorabilia to help process his feelings including any feelings of guilt associated with her loss.  We will use cognitive behavioral thera py to help identify and change anxiety producing thoughts and behavior patterns so as to improve his ability to better manage anxiety and stress, and manage thoughts and worrisome thinking contributing to anxiety.  We will also refresh and encourage dialectical behavior therapy  distress tolerance and mindfulness skills with the intention of reducing anxiety by 50% over the next 6 months. Progress: 30% Interventions: Cognitive Behavior al Therapy  Diagnosis:PTSD, Bi-Polar D/O  Plan: F/U/ appointments scheduled weekly.  Cecile Coder, [facility]                                                                                                                   Cecile Coder, [facility]               Cecile Coder, [facility]                Cecile Coder, [facility]  [facility] [facility] Counselor/Therapist Progress Note  Patient ID: Kazi Reppond, MRN: [id number],     Date: 04/01/2024 This session was held via video teletherapy. The patient consented to the video teletherapy and was located in his home during this session. He is aware it is the responsibility of the patient to secure confidentiality on his end of the session. The provider was in a private home office for the duration of this session.   The patient arrived on time for her Caregility session  Time Spent: 1:05 PM until 2 PM,  55 minutes Treatment Type: Individual Therapy Reported Symptoms: anxiety, panic attacks The patient said he came in from his walk this morning and saw what he thought was some sort of tic from his wife.  It was not the first time it happened and he wonders if it might be tardive dyskinesia because of some new medications that she is on.  She has a call into the doctor right now and hopes to hear something back soon.  They have narrow ed down to the fact they think most of her discomfort in breathing is coming from the diaphragm and they are looking for alternatives for treatment.  The patient continues to do those  things which help him cope including walking, fishing when he can, spending time with his son and daughter-in-law and grandson.  He also has a way of continued coping with grief he is writing letters to his daughters as a way of expressing his feelings a nd says that is very beneficial.  He appears to be grieving in a healthy way but he knows that he to year anniversary of her death is coming up and there is some grief there but says it is not overwhelming.  We will continue to work on coping skills for helping with his anxiety and the progression of his Lewy body dementia diagnosis as well as processing his grief. Mental Status Exam: Appearance:  Well Groomed  Behavior: Appropri ate Motor: Pt. Reports some instability due to Lewey Body dementia Speech/Language:  Normal Rate Affect: Appropriate Mood: normal Thought process: normal Thought content:  WNL Sensory/Perceptual disturbances:  WNL Orientation: oriented to person, place, and situation Attention: Good Concentration: Good Memory: Impaired short term Fund of knowledge:  Good Insight:  Good Judgment:  Good Impulse Control: Good  Risk Asse ssment: Danger to Self: No Self-injurious Behavior: No Danger to Others: No Duty to Warn:no Physical Aggression / Violence:No  Access to Firearms a concern: No  Gang Involvement:No  Treatment plan: Will employ cognitive behavioral therapy as well as grief and loss therapy for relief of anxiety and grief.  Goals of grief therapy or to have a healthier response to the loss of his daughter and less sadness as evidenced by patient  report in therapy notes as well as to help him feel less overwhelmed by her loss.  Goals are to see a 50% reduction in grief symptoms over the next 6 months.  I will encourage the use of the patient telling his grief story about his daughter including encouraging him to bring pictures and memorabilia to help process his feelings including any feelings of guilt  associated with her loss.  We will use cognitive behavioral therapy to help i dentify and change anxiety producing thoughts and behavior patterns so as to improve his ability to better manage anxiety and stress, and manage thoughts and worrisome thinking contributing to anxiety.  We will also refresh and encourage dialectical behavior therapy distress tolerance  and mindfulness skills with the intention of reducing anxiety by 50% over the next 6 months. Progress: 30% Interventions: Cognitive Behavioral Therapy   Diagnosis:PTSD, Bi-Polar D/O  Plan: F/U/ appointments scheduled weekly.  Cecile Coder, [facility]                                                                                                                   Cecile Coder, [facility]  [facility] [facility] Counselor/Therapist Progress Note  Patient ID: Kalyb Pemble, MRN: [id number],    Date: 04/01/2024 This session was held via video teletherapy. The patient consented to  the video teletherapy and was located in his home during this session. He is aware it is the responsibility of the patient to secure confidentiality on his end of the session. The provider was in a private home office for the duration of this session.   The patient arrived on time for her Caregility session  Time Spent: 59 minutes, 2 PM to 2:59 PM  Treatment Type: Individual Therapy Reported Symptoms: anxiety, panic attacks The  patient continues to present with physical pain and both his back and his knee.  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  He has continued to walk because he knows that is good for him in various ways but says it has been painful.  His wife als o was not feeling well but she is going back to the doctor next week to have an ablation and hopes that will bring her some relief.  For the most part he has been at the last couple weeks reaching back out and hearing from some old friends as well as time with his son and son's family.  He says that his wife's cousin is coming down for a weekend soon and they enjoy time with her and that he will go to his brother's house sometime in the  next few weeks.  He recognizes the need for people that he cares about in helping with his depression. We will continue to work on coping skills for helping with his anxiety and the  progression of his Lewy body dementia diagnosis as well as processing his grief. 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 Speech/Language:  Normal Rate Affec t: Appropriate Mood: normal Thought process: normal Thought content:  WNL Sensory/Perceptual disturbances:  WNL Orientation: oriented to person, place, and situation Attention: Good Concentration: Good Memory: Impaired short term Fund of knowledge:  Good Insight:  Good Judgment:  Good Impulse Control: Good  Risk Assessment: Danger to Self: No Self-injurious Behavior: No Danger to Others: No Duty to Warn:no Physica l Aggression / Violence:No  Access to Firearms a concern: No  Gang Involvement:No  Treatment plan: Will employ cognitive behavioral therapy as well as grief and loss therapy for relief of anxiety and grief.  Goals of grief therapy or to have a healthier response to the loss of his daughter and less sadness as evidenced by patient report in therapy notes as well as to help him feel less overwhelmed by her loss.  Goals are to see a 50%  reduction in grief symptoms over the next 6 months.  I will encourage the use of the patient telling his grief story about his daughter including encouraging him to bring pictures and memorabilia to help process his feelings including any feelings of guilt associated with her loss.  We will use cognitive behavioral therapy to help identify and change anxiety producing thoughts and behavior patterns so as to improve his ability to bette r manage anxiety and stress, and manage thoughts and worrisome thinking contributing to anxiety.  We will also  refresh and encourage dialectical behavior therapy distress tolerance and mindfulness skills with the intention of reducing anxiety by 50% over the next 6 months. Progress: 30% Interventions: Cognitive Behavioral Therapy  Diagnosis:PTSD, Bi-Polar  D/O  Plan: F/U/ appointments scheduled weekly.  Cecile Coder, [facility]                                                                                                                    Cecile Coder, [facility]               Cecile Coder, [facility]               Cecile Coder, [facility]               Cecile Coder, [facility]  [place] [facility] Counselor/Therapist Progress Note  Patient ID: Johnmark Geiger, MRN: [id number],    Date: 04/01/2024 This session was held via video  teletherapy. The patient consented to the video teletherapy and was located in his home during this  session. He is aware it is the responsibility of the patient to secure confidentiality on his end of the session. The provider was in a private home office for the duration of this session.   The patient arrived on time for her Caregility session  Time Spent: 57 minutes, 2 PM to 2:57 PM  Treatment Type: Individual Therapy Reported  Symptoms: anxiety, panic attacks The patient is still having significant back pain.  He cannot sit anywhere for very long and has to be careful where he sits.  There is still some unsteadiness although he did not report any falls over the past 2 weeks.  His biggest concern now is for his wife.  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  She is meeting wi th her cardiologist again next week to see what possible treatment is for her.  He still thinks there may be some issue with her diaphragm but she is meeting with the pulmonologist also.  He is doing what he can to help her.  His daughter's birthday is March 11 so he knows that is a difficult time for both he and his wife next week.  Typically they do something to remember her but because of the wife's current health issues they canno t go to [place] as they had planned so they are talking about what to do to remember and celebrate her birthday.  We will continue to work on coping skills for helping with his anxiety and the progression of his Lewy body dementia diagnosis as well as processing his grief. 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 Speech/Language:  N ormal Rate Affect: Appropriate Mood: normal Thought process: normal Thought content:  WNL Sensory/Perceptual disturbances:  WNL Orientation: oriented to person, place, and situation Attention: Good Concentration: Good Memory: Impaired short term Fund of knowledge:   Good Insight:  Good Judgment:  Good Impulse Control: Good  Risk Assessment: Danger to Self: No Self-injurious Behavior: No Danger to Others: No Duty to  Warn:no Physical Aggression / Violence:No  Access to Firearms a concern: No  Gang Involvement:No  Treatment plan: Will employ cognitive behavioral therapy as well as grief and loss therapy for relief of anxiety and grief.  Goals of grief therapy or to have a healthier response to the loss of his daughter and less sadness as evidenced by patient report in therapy notes as well as to help him feel less overwhelmed by her loss.  Goals  are to see a 50% reduction in grief symptoms over the next 6 months.  I will encourage the use of the patient telling his grief story about his daughter including encouraging him to bring pictures and memorabilia to help process his feelings including any feelings of guilt associated with her loss.  We will use cognitive behavioral therapy to help identify and change anxiety producing thoughts and behavior patterns so as to improve his  ability to better manage anxiety and stress, and manage thoughts and worrisome thinking contributing to anxiety.  We will also refresh and encourage dialectical behavior therapy distress tolerance and mindfulness  skills with the intention of reducing anxiety by 50% over the next 6 months. Progress: 30% Interventions: Cognitive Behavioral Therapy  Diagnosis:PTSD, Bi-Polar D/O  Plan: F/U/ appointments scheduled weekly.  Jone Neither P eters, [facility]                                                                                                                   Cecile Coder, [facility]  [facility] Counselor/Therapist Progress Note  Patient ID: Merced Hanners, MRN: [id number],    Date: 04/01/2024 This session was held via video teletherapy. The patient consented to the video teletherapy and was located in his home during this session. He is aware it i s the responsibility of the patient to secure confidentiality on his end of the session. The provider was in a private home office for the duration of this session.   The patient arrived on time for her Caregility session  Time Spent: 58 minutes, 2 PM to 2:58 PM  Treatment Type: Individual Therapy Reported Symptoms: anxiety, panic attacks A fairly difficult week because it was his daughter's birthday.  He said this year was more  difficult than last year but he could not pinpoint why.  They did remember her about going out to a nice restaurant which is something her daughter always wanted to do.  He said they  were able to laugh and joke about funny things that his daughter had said or done which is not something they were able to do this time last year so he saw that as healthy grieving.  He knows that the time change threw things off a little bit for everybody  and his wife still is not feeling well and his back has been bothering him.  He was not able to go to his first round of physical therapy this week because his back was bothering him more.  He has been thinking a lot more about his daughter which he feels comfortable with.  He is thankful that it is getting warmer and staying light longer so he can get back to some of the coping skills such as fishing that he has done in the past.    We will continue to work on coping skills for helping with his anxiety and the progression of his Lewy body dementia diagnosis as well as processing his grief. 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 Speech/Language:  Normal Rate Affect: Appropriate Mood: normal Thought process: normal Thought content:  WNL Sensory/Perceptual distur bances:  WNL Orientation: oriented to person, place, and situation Attention: Good Concentration: Good Memory: Impaired short term Fund of knowledge:  Good Insight:  Good Judgment:  Good Impulse Control: Good  Risk Assessment: Danger to Self: No Self-injurious Behavior: No Danger to Others: No Duty to Warn:no Physical Aggression / Violence:No  Access to Firearms a concern: No  Gang Involvement:No  Treatment plan: Wi ll employ cognitive behavioral therapy as well as grief and loss therapy for relief of anxiety and grief.  Goals of grief therapy or to have a healthier response to the loss of his daughter and less sadness as evidenced by patient report in therapy notes as well as to help him feel less overwhelmed by her loss.  Goals are to see a 50% reduction in grief symptoms over  the next 6 months.  I will encourage the use of the patient telling hi s grief story about his daughter including encouraging him to bring pictures and memorabilia to help process his feelings including any feelings of guilt associated with her loss.  We will use cognitive behavioral therapy to help identify and change anxiety producing thoughts and behavior patterns so as to improve his ability to better manage anxiety and stress, and manage thoughts and worrisome thinking contributing to anxiety.  We wil l also refresh and encourage dialectical behavior therapy  distress tolerance and mindfulness skills with the intention of reducing anxiety by 50% over the next 6 months. Progress: 30% Interventions: Cognitive Behavioral Therapy  Diagnosis:PTSD, Bi-Polar D/O  Plan: F/U/ appointments scheduled weekly.  Cecile Coder, [facility]                                                                                                                    Cecile Coder, [facility]               Cecile Coder, [facility]               Cecile Coder, [facility]  [facility] Counselor/Therapist Progress Note  Patient ID: Xxavier Noon, MRN: [id number],    Date: 04/01/2024 This session was held via video teletherapy. The patient consented to the video teletherapy and was located in his home during this session. He is aware it is the responsibility of the p atient to secure confidentiality on his end of the session. The provider was in a private home office for the duration of this session.   The patient arrived on time for her Caregility session  Time Spent: 57 minutes, 2 PM to 2:57 PM  Treatment Type: Individual Therapy Reported Symptoms: anxiety, panic attacks The patient is still having significant back pain.  He cannot sit anywhere for very long and has to be careful where he  sits.  There is still some unsteadiness although he did not report any falls over the past 2 weeks.  His biggest concern now is for his wife.  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  She is meeting with her cardiologist again next week to see what possible treatment is for her.  He still thinks there may be some issue with her diaphragm but she is meeti ng with the pulmonologist also.  He is doing what he can to help her.  His daughter's birthday is March 11 so he knows that is a difficult time for both he and his wife next week.   Typically they do something to remember her but because of the wife's current health issues they cannot go to [place] as they had planned so they are talking about what to do to remember and celebrate her birthday.  We will continue to work on copin g skills for helping with his anxiety and the progression of his Lewy body dementia diagnosis as well as processing his grief. 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

## 2024-04-08 ENCOUNTER — Ambulatory Visit (INDEPENDENT_AMBULATORY_CARE_PROVIDER_SITE_OTHER): Admitting: Behavioral Health

## 2024-04-08 ENCOUNTER — Encounter: Payer: Self-pay | Admitting: Behavioral Health

## 2024-04-08 DIAGNOSIS — F319 Bipolar disorder, unspecified: Secondary | ICD-10-CM

## 2024-04-08 DIAGNOSIS — F3181 Bipolar II disorder: Secondary | ICD-10-CM

## 2024-04-08 DIAGNOSIS — F4310 Post-traumatic stress disorder, unspecified: Secondary | ICD-10-CM

## 2024-04-08 DIAGNOSIS — F4321 Adjustment disorder with depressed mood: Secondary | ICD-10-CM

## 2024-04-08 NOTE — Progress Notes (Deleted)
                                                                                                                                                                                                                                                                                                                                                                                                                                                                                                                                                                                                                          [facility] Counselor/Therapist Progress Note  Patient ID: Stephen Myers, MRN: [id number],    Date: 6 /04/2024 This session was held via video teletherapy. The patient consented to the video teletherapy and was located in his home during this session. He is aware it is the responsibility of the patient to secure confidentiality on his end of the session. The provider was in a private home office for the duration of this session.   The patient arrived on time for her Caregility session  Time Spent: 59 minutes, 2 PM to 2:59 PM  Trea tment Type: Individual Therapy Reported Symptoms: anxiety, panic attacks The patient said he jokingly said to his wife that he had not had any pain issues in the cervical area and  then a few days ago he started having pain in his neck and shoulders.  He now sits related to degenerative disk and has some consistent pain somewhere in his spine with that but jokingly says it just depends on Wednesday which part of his body is decides to  show up and hurt.  He is doing the best that he can.  He is still walking which he knows is good for him but does not have right now the strength to do as much in the morning as he has done in the past so he is denying that to 3 times a day for consistency.  He is still doing some positive things and that he is staying in touch with his brother and other family members.  He is reconnecting with friends from high school and he is which s he is enjoying.  One of them has had a similar experience in that she lost her son in the same way the patient lost his daughter.  He said they did not think and Easter in which they set a place for his daughter at the table and put her picture there.  He initially had reservations about doing that he and his wife had made everyone feel as if she was really there with him.  He still feels that he is grieving in a healthy way.  He and hi s wife have always taken a trip around both of their birthdays which are 2 days apart.  That is now close to his daughter died so they have started taking some of her rashes with him and sprinkling them where they go as a way of remembrance.  They are deciding what that looks like this year in July.  He does contract for safety having no thoughts of hurting himself or anyone else. We will continue to work on coping skills for helping  with his anxiety and the progression of his Lewy body dementia diagnosis as well as processing his grief. 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 Speech/Language:  Normal Rate Affect: Appropriate Mood: normal Thought process: normal Thought content:   WNL Sensory/Perceptual disturbances:  WNL Orientation: oriented to person, place,  and situation Attention: Good Concentration: Good Memory: Impaired short term Fund of knowledge:  Good Insight:  Good Judgment:  Good Impulse Control: Good  Risk Assessment: Danger to Self: No Self-injurious Behavior: No Danger to Others: No Duty to Warn:no Physical Aggression / Violence:No  Access to Firearms a concern: No  Gang Involvement:No  Treatment plan: Will employ cognitive behavioral therapy as well as grie f and loss therapy for relief of anxiety and grief.  Goals of grief  therapy or to have a healthier response to the loss of his daughter and less sadness as evidenced by patient report in therapy notes as well as to help him feel less overwhelmed by her loss.  Goals are to see a 50% reduction in grief symptoms over the next 6 months.  I will encourage the use of the patient telling his grief story about his daughter including encouraging  him to bring pictures and memorabilia to help process his feelings including any feelings of guilt associated with her loss.  We will use cognitive behavioral therapy to help identify and change anxiety producing thoughts and behavior patterns so as to improve his ability to better manage anxiety and stress, and manage thoughts and worrisome thinking contributing to anxiety.  We will also refresh and encourage dialectical behavior ther apy distress tolerance and mindfulness skills with the intention of reducing anxiety by 50% over the next 6 months. Progress: 30% Interventions: Cognitive Behavioral Therapy  Diagnosis:PTSD, Bi-Polar D/O  Plan: F/U/ appointments scheduled weekly.  Cecile Coder, [facility]                                                                                                                    Cecile Coder,  [facility]  [facility] Counselor/Therapist Progress Note  Patient ID: Stephen Myers, MRN: [id number],    Date: 04/08/2024 This session was held via video teletherapy. The patient consented to the video teletherapy and was located in his home during this session. He is aware it is the responsibility of the patient to secure confidentiality on his end of the session. The provider was in a private home office for the duration of this session.   The pa tient arrived on time for her Caregility session  Time Spent: 58 minutes, 2 PM to 2:58 PM  Treatment Type: Individual Therapy Reported Symptoms: anxiety, panic attacks A fairly difficult week because it was his daughter's birthday.  He said this year was more  difficult than last year but he could not pinpoint why.  They did remember her about going out to a nice restaurant which is something her daughter always wanted to do.  He sa id they were able to laugh and joke about funny things that his daughter had said or done which is not something they were able to do this time last year so he saw that as healthy grieving.  He knows that the time change threw things off a little bit for everybody and his wife still is not feeling well and his back has been bothering him.  He was not able to go to his first round of physical therapy this week because his back was bother ing him more.  He has been thinking a lot more about his daughter which he feels comfortable with.  He is thankful that it is getting warmer and staying light longer so he can get back to some of the coping skills such as fishing that he has done in the past.   We will continue to work on coping skills for helping with his anxiety and the progression of his Lewy body dementia diagnosis as well as processing his grief. Mental Status  Exam: Appearance:  Well Groomed  Behavior: Appropriate Motor: Pt. Reports some instability due to Lewey Body dementia Speech/Language:  Normal Rate Affect: Appropriate Mood: normal Thought process: normal Thought content:  WNL Sensory/Perceptual disturbances:  WNL Orientation: oriented to person, place, and situation Attention: Good Concentration: Good Memory: Impaired short term Fund of knowledge:  Good Insight:  Go od Judgment:  Good Impulse Control: Good  Risk Assessment: Danger to Self: No Self-injurious Behavior: No Danger to Others: No Duty to Warn:no Physical Aggression / Violence:No  Access to Firearms a concern: No  Gang Involvement:No  Treatment plan: Will employ cognitive behavioral therapy as well as grief and loss therapy for relief of anxiety and grief.  Goals of grief therapy or to have a healthier response to the loss of  his  daughter and less sadness as evidenced by patient report in therapy notes as well as to help him feel less overwhelmed by her loss.  Goals are to see a 50% reduction in grief symptoms over the next 6 months.  I will encourage the use of the patient telling his grief story about his daughter including encouraging him to bring pictures and memorabilia to help process his feelings including any feelings of guilt associated with her lo ss.  We will use cognitive behavioral therapy to help identify and change anxiety producing thoughts and behavior patterns so as to improve his ability to better manage anxiety and stress, and manage thoughts and worrisome thinking contributing to anxiety.  We will also refresh and encourage dialectical behavior therapy  distress tolerance and mindfulness skills with the intention of reducing anxiety by 50% over the next 6 months. Progr ess: 30% Interventions: Cognitive Behavioral Therapy  Diagnosis:PTSD, Bi-Polar D/O  Plan: F/U/ appointments scheduled weekly.  Cecile Coder, [facility]                                                                                                                   Cecile Coder, [facility]               Crai g Kelly Patient, [facility]               Cecile Coder, [facility]  [facility] Counselor/Therapist Progress Note  Pat ient ID: Stephen Myers, MRN: [id number],    Date: 04/08/2024 This session was held via video teletherapy. The patient consented to the video teletherapy and was located in his home during this session. He is aware it is the responsibility of the patient to secure confidentiality on his end of the session. The provider was in a private home office for the duration of this session.   The patient arrived on time for her Caregility s ession  Time Spent: 1:05 PM until 2 PM, 55 minutes Treatment Type: Individual Therapy Reported Symptoms: anxiety, panic attacks The patient said he came in from his walk this morning and saw what he thought was some sort of tic from his wife.  It was not the first time it happened and he wonders if it might be tardive dyskinesia because of some new medications that she is on.  She has a call into the doctor right now and hopes to h ear something back soon.  They have narrowed down to the fact they think most of her discomfort in breathing is coming from the diaphragm and they are looking for alternatives for  treatment.  The patient continues to do those things which help him cope including walking, fishing when he can, spending time with his son and daughter-in-law and grandson.  He also has a way of continued coping with grief he is writing letters to his daugh ters as a way of expressing his feelings and says that is very beneficial.  He appears to be grieving in a healthy way but he knows that he to year anniversary of her death is coming up and there is some grief there but says it is not overwhelming.  We will continue to work on coping skills for helping with his anxiety and the progression of his Lewy body dementia diagnosis as well as processing his grief. Mental Status Exam: Appear ance:  Well Groomed  Behavior: Appropriate Motor: Pt. Reports some instability due to Lewey Body dementia Speech/Language:  Normal Rate Affect: Appropriate Mood: normal Thought process: normal Thought content:  WNL Sensory/Perceptual disturbances:  WNL Orientation: oriented to person, place, and situation Attention: Good Concentration: Good Memory: Impaired short term Fund of knowledge:  Good Insight:  Good Judgment:   Good Impulse Control: Good  Risk Assessment: Danger to Self: No Self-injurious Behavior: No Danger to Others: No Duty to Warn:no Physical Aggression / Violence:No  Access to Firearms a concern: No  Gang Involvement:No  Treatment plan: Will employ cognitive behavioral therapy as well as grief and loss therapy for relief of anxiety and grief.  Goals of grief therapy or to have a healthier response to the loss of his daughter  and less sadness as evidenced by patient report in therapy notes as well as to help him feel less overwhelmed by her loss.  Goals are to see a 50% reduction in grief symptoms over the next 6 months.  I will encourage the use of the patient telling his grief story about his daughter including encouraging him to bring pictures and memorabilia to help process his  feelings including any feelings of guilt associated with her loss.  We will  use cognitive behavioral therapy to help identify and change anxiety producing thoughts and behavior patterns so as to improve his ability to better manage anxiety and stress, and manage thoughts and worrisome thinking contributing to anxiety.  We will also refresh and encourage dialectical behavior therapy distress tolerance  and mindfulness skills with the intention of reducing anxiety by 50% over the next 6 months. Progress: 30% Int erventions: Cognitive Behavioral Therapy  Diagnosis:PTSD, Bi-Polar D/O  Plan: F/U/ appointments scheduled weekly.  Cecile Coder, [facility]                                                                                                                   Cecile Coder, [facility]  [facility] Counselor/Therapist Progress Note  Patient ID: Stephen Myers, MRN: [id number],    Date: 04/08/2024 This session was held via vi deo teletherapy. The patient consented to the video teletherapy and was located in his home during this session. He is aware it is the responsibility of the patient to secure confidentiality on his end of the session. The provider was in a private home office for the duration of this session.   The patient arrived on time for her Caregility session  Time Spent: 59 minutes, 2 PM to 2:59 PM  Treatment Type: Individual Therapy Repor ted Symptoms: anxiety, panic attacks The patient continues to present with physical pain and both his back and his knee.  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  He has continued to walk because he knows that is good for him in various ways bu Stephen Myers says it has been painful.  His wife also was not feeling well but she is going back to the doctor next week to have an ablation and hopes that will bring her some relief.  For the most part he has been at the last couple weeks reaching back out and hearing from some old friends as well as time with his son and son's family.  He says that his wife's cousin is coming down for a weekend soon and they enjoy time with her and that he will  go to his brother's house sometime in the next few weeks.  He recognizes the need for people that he cares about in helping with his depression. We will continue to work on coping  skills for helping with his anxiety and the progression of his Lewy body dementia diagnosis as well as processing his grief. Mental Status Exam: Appearance:  Well Groomed  Behavior: Appropriate Motor: Pt. Reports some instability due to Lewey Body demen tia Speech/Language:  Normal Rate Affect: Appropriate Mood: normal Thought process: normal Thought content:  WNL Sensory/Perceptual disturbances:  WNL Orientation: oriented to person, place, and situation Attention: Good Concentration: Good Memory: Impaired short term Fund of knowledge:  Good Insight:  Good Judgment:  Good Impulse Control: Good  Risk Assessment: Danger to Self: No Self-injurious Behavior: No Dange r to Others: No Duty to Warn:no Physical Aggression / Violence:No  Access to Firearms a concern: No  Gang Involvement:No  Treatment plan: Will employ cognitive behavioral therapy as well as grief and loss therapy for relief of anxiety and grief.  Goals of grief therapy or to have a healthier response to the loss of his daughter and less sadness as evidenced by patient report in therapy notes as well as to help him feel less overwhe lmed by her loss.  Goals are to see a 50% reduction in grief symptoms over the next 6 months.  I will encourage the use of the patient telling his grief story about his daughter including encouraging him to bring pictures and memorabilia to help process his feelings including any feelings of guilt associated with her loss.  We will use cognitive behavioral therapy to help identify and change anxiety producing thoughts and behavior patte rns so as to improve his ability to better manage anxiety and stress, and manage thoughts and worrisome thinking contributing to anxiety.  We will also  refresh and encourage dialectical behavior therapy distress tolerance and mindfulness skills with the intention of reducing anxiety by 50% over the next 6 months. Progress: 30% Interventions: Cognitive Behavioral  Therapy  Diagnosis:PTSD, Bi-Polar D/O  Plan: F/U/ appointments schedu led weekly.  Cecile Coder, [facility]                                                                                                                   Cecile Coder, [facility]               Cecile Coder, [facility]               Cecile Coder, [facility]               Cecile Coder, [facility]  [facility] Counselor/Therapist Progress Note  Patient ID: Stephen Myers, MRN: [id number],    Date: 6 /04/2024 This session was held via video teletherapy. The patient consented to the video teletherapy and was  located in his home during this session. He is aware it is the responsibility of the patient to secure confidentiality on his end of the session. The provider was in a private home office for the duration of this session.   The patient arrived on time for her Caregility session  Time Spent: 57 minutes, 2 PM to 2:57 PM  Trea tment Type: Individual Therapy Reported Symptoms: anxiety, panic attacks The patient is still having significant back pain.  He cannot sit anywhere for very long and has to be careful where he sits.  There is still some unsteadiness although he did not report any falls over the past 2 weeks.  His biggest concern now is for his wife.  She has been having some difficulty breathing and they did a test so they found a small hole on the ba ck side of her heart.  She is meeting with her cardiologist again next week to see what possible treatment is for her.  He still thinks there may be some issue with her diaphragm but she is meeting with the pulmonologist also.  He is doing what he can to help her.  His daughter's birthday is March 11 so he knows that is a difficult time for both he and his wife next week.  Typically they do something to remember her but because of the  wife's current health issues they cannot go to [place] as they had planned so they are talking about what to do to remember and celebrate her birthday.  We will continue to work on coping skills for helping with his anxiety and the progression of his Lewy body dementia diagnosis as well as processing his grief. Mental Status Exam: Appearance:  Well Groomed  Behavior: Appropriate Motor: Pt. Reports some instability due to  Lewey Body dementia Speech/Language:  Normal Rate Affect: Appropriate Mood: normal Thought process: normal Thought content:  WNL Sensory/Perceptual disturbances:  WNL Orientation: oriented to person, place, and situation Attention: Good Concentration: Good Memory: Impaired short  term Fund of knowledge:  Good Insight:  Good Judgment:  Good Impulse Control: Good  Risk Assessment: Danger to Self: No Self-injurious Beh avior: No Danger to Others: No Duty to Warn:no Physical Aggression / Violence:No  Access to Firearms a concern: No  Gang Involvement:No  Treatment plan: Will employ cognitive behavioral therapy as well as grief and loss therapy for relief of anxiety and grief.  Goals of grief therapy or to have a healthier response to the loss of his daughter and less sadness as evidenced by patient report in therapy notes as well as to help him f eel less overwhelmed by her loss.  Goals are to see a 50% reduction in grief symptoms over the next 6 months.  I will encourage the use of the patient telling his grief story about his daughter including encouraging him to bring pictures and memorabilia to help process his feelings including any feelings of guilt associated with her loss.  We will use cognitive behavioral therapy to help identify and change anxiety producing thoughts an d behavior patterns so as to improve his ability to better manage anxiety and stress, and manage thoughts and worrisome thinking contributing to anxiety.  We will also refresh and encourage dialectical behavior therapy distress tolerance and mindfulness  skills with the intention of reducing anxiety by 50% over the next 6 months. Progress: 30% Interventions: Cognitive Behavioral Therapy  Diagnosis:PTSD, Bi-Polar D/O  Plan: F/U/ app ointments scheduled weekly.  Cecile Coder, [facility]                                                                                                                   Cecile Coder, [facility]  [facility] Counselor/Therapist Progress Note  Patient ID: Stephen Myers, MRN: [id number],    Date: 04/08/2024 This session was held via video teletherapy. The patient consented to the video teletherapy and was located in his hom e during this session. He is aware it is the responsibility of the patient to secure confidentiality on his end of the session. The provider was in a private home office for the duration of this session.   The patient arrived on time for her Caregility session  Time Spent: 58 minutes, 2 PM to 2:58 PM  Treatment Type: Individual Therapy Reported Symptoms: anxiety, panic attacks A fairly difficult week because it was his daughter' s birthday.  He said this year was more difficult than last year but he could not pinpoint why.  They did remember her about going out to a nice restaurant which is something her  daughter always wanted to do.  He said they were able to laugh and joke about funny things that his daughter had said or done which is not something they were able to do this time last year so he saw that as healthy grieving.  He knows that the time change thre w things off a little bit for everybody and his wife still is not feeling well and his back has been bothering him.  He was not able to go to his first round of physical therapy this week because his back was bothering him more.  He has been thinking a lot more about his daughter which he feels comfortable with.  He is thankful that it is getting warmer and staying light longer so he can get back to some of the coping skills such as fis hing that he has done in the past.   We will continue to work on coping skills for helping with his anxiety and the progression of his Lewy body dementia diagnosis as well as processing his grief. 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 Speech/Language:  Normal Rate Affect: Appropriate Mood: normal Thought process: normal Thought co ntent:  WNL Sensory/Perceptual disturbances:  WNL Orientation: oriented to person, place, and situation Attention: Good Concentration: Good Memory: Impaired short term Fund of knowledge:  Good Insight:  Good Judgment:  Good Impulse Control: Good  Risk Assessment: Danger to Self: No Self-injurious Behavior: No Danger to Others: No Duty to Warn:no Physical Aggression / Violence:No  Access to Firearms a concern: No  G ang Involvement:No  Treatment plan: Will employ cognitive behavioral therapy as well as grief and loss therapy for relief of anxiety and grief.  Goals of grief therapy or to have a healthier response to the loss of his daughter and less sadness as evidenced by patient report in therapy notes as well as to help him feel less overwhelmed by her loss.  Goals are  to see a 50% reduction in grief symptoms over the next 6 months.  I will enco urage the use of the patient telling his grief story about his daughter including encouraging him to bring pictures and memorabilia to help process his feelings including any feelings of guilt associated with her loss.  We will use cognitive behavioral therapy to help identify and change anxiety producing thoughts and behavior patterns so as to improve his ability to better manage anxiety and stress, and manage thoughts and worrisome th inking contributing to anxiety.  We will also refresh and encourage dialectical behavior therapy  distress tolerance and mindfulness skills with the intention of reducing anxiety by 50% over the next 6 months. Progress: 30% Interventions: Cognitive Behavioral Therapy  Diagnosis:PTSD, Bi-Polar D/O  Plan: F/U/ appointments scheduled weekly.  Cecile Coder, [facility]                                                                                                                    Cecile Coder, [facility]               Cecile Coder, [facility]               Cecile Coder, [facility]  [place] [facility] Counselor/Therapist Progress Note  Patient ID: Stephen Myers, MRN: [id number],    Date: 04/08/2024 This session was held via video teletherapy. The patient consented to the video teletherapy and was located in his home during this session. He is a ware it is the responsibility of the patient to secure confidentiality on his end of the session. The provider was in a private home office for the duration of this session.   The patient arrived on time for her Caregility session  Time Spent: 57 minutes, 2 PM to 2:57 PM  Treatment Type: Individual Therapy Reported Symptoms: anxiety, panic attacks The patient is still having significant back pain.  He cannot sit anywhere for ver y long and has to be careful where he sits.  There is still some unsteadiness although he did not report any falls over the past 2 weeks.  His biggest concern now is for his wife.  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  She is meeting with her cardiologist again next week to see what possible treatment is for her.  He still thinks there may be some iss ue with her diaphragm but she is meeting with the pulmonologist also.  He is doing what he can to help her.  His daughter's birthday is March 11 so he knows that is a difficult time  for both he and his wife next week.  Typically they do something to remember her but because of the wife's current health issues they cannot go to [place] as they had planned so they are talking about what to do to remember and celebrate her birthday .  We will continue to work on coping skills for helping with his anxiety and the progression of his Lewy body dementia diagnosis as well as processing his grief. 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 Speech/Language:  Normal Rate Affect: Appropriate Mood: normal Thought process: normal Thought content:  WNL Sensory/Perceptual di sturbances:  WNL Orientation: oriented to person, place, and situation Attention: Good Concentration: Good Memory: Impaired short term Fund of knowledge:  Good Insight:  Good Judgment:  Good Impulse Control: Good  Risk Assessment: Danger to Self: No Self-injurious Behavior: No Danger to Others: No Duty to Warn:no Physical Aggression / Violence:No  Access to Firearms a concern: No  Gang Involvement:No  Treatment plan : Will employ cognitive behavioral therapy as well as grief and loss therapy for relief of anxiety and grief.  Goals of grief therapy or to have a healthier response to the loss of his daughter and less sadness as evidenced by patient report in therapy notes as well as to help him feel less overwhelmed by her loss.  Goals are to see a 50% reduction in grief symptoms over the next 6 months.  I will encourage the use of the patient tellin g his grief story about his daughter including encouraging him to bring pictures and memorabilia to help process his feelings including any feelings of guilt associated with her loss.  We will use cognitive behavioral therapy to help identify and change anxiety producing thoughts and behavior patterns so as to improve his ability to better manage anxiety and stress,  and manage thoughts and worrisome thinking contributing to anxiety.  We  will also refresh and encourage dialectical behavior therapy distress tolerance and mindfulness  skills with the intention of reducing anxiety by 50% over the next 6 months. Progress: 30% Interventions: Cognitive Behavioral Therapy  Diagnosis:PTSD, Bi-Polar D/O  Plan: F/U/ appointments scheduled weekly.  Cecile Coder, [facility]                                                                                                                    Cecile Coder, [facility]  [place] [facility] Counselor/Therapist Progress Note  Patient ID:  Stephen Myers, MRN: [id number],    Date: 04/08/2024 This session was held via video teletherapy. The patient consented to the video teletherapy and was located in his home during this session. He is aware it is the responsibility of the patient to secure confidentiality on his end of the session. The provider was in a private  home office for the duration of this session.   The patient arrived on time for her Caregility session  Time Spent: 57 minutes, 2 PM to 2:57 PM  Treatment Type: Individual Therapy Reported Symptoms: anxiety, panic attacks  We will continue to work on coping skills for helping with his anxiety and the progression of his Lewy body dementia diagnosis as well as processing his grief. 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 Speech/Language:  Normal Rate Affect: Appropriate Mood: normal Thought process: normal Thought content:  WNL Sensory/Perceptual disturbances:  WNL Orientation: oriented to person, place, and situation Attention: Good Concentration: Good Memory: Impaired short term Fund of knowledge:  Good Insight:  Good Judgment:  Good Impulse Con trol: Good  Risk Assessment: Danger to Self: No Self-injurious Behavior: No Danger to Others: No Duty to Warn:no Physical Aggression / Violence:No  Access to Firearms a concern: No  Gang Involvement:No  Treatment plan: Will employ cognitive behavioral therapy as well as grief and loss therapy for relief of anxiety and grief.  Goals of grief therapy or to have a healthier response to the loss of his daughter and less sadness a s evidenced by patient report in therapy notes as well as to help him feel less overwhelmed by her loss.  Goals are to see a 50% reduction in grief symptoms over the next 6 months.  I will encourage the use of the patient telling his grief story about his daughter including encouraging him to  bring pictures and memorabilia to help process his feelings including any feelings of guilt associated with her loss.  We will use cognitive behav ioral therapy to help identify and change anxiety producing thoughts and behavior patterns so as to improve his ability to better manage anxiety and stress, and manage thoughts and worrisome thinking contributing to anxiety.  We will also refresh and encourage dialectical behavior therapy distress tolerance and mindfulness skills with the intention of reducing anxiety by 50% over the next 6 months. Progress: 30% Interventions: Cogniti ve Behavioral Therapy  Diagnosis:PTSD, Bi-Polar D/O  Plan: F/U/ appointments scheduled weekly.  Cecile Coder, [facility]                                                                                                                   Cecile Coder, [facility]               Cecile Coder, [facility]                Cecile Coder, [name]  Cecile Coder, [facility]               Cecile Coder, [facility]               Cecile Coder, [facility]               Cecile Coder, [facility]  [facility] Counselor/Therapist Progress Note  Patient ID: Stephen Myers, MRN: [id number],    Date: 04/08/2024 This session was held via video teletherapy. The patient consented to the video teletherapy and was located in his home during this session. He is aware it is the responsibility of the patien Stephen Myers to secure confidentiality on his end of the session. The provider was in a private home office for the duration of this session.   The patient arrived on time for her Caregility session  Time Spent: 39 minutes, 2 PM to 2:39 PM  Treatment Type: Individual Therapy Reported Symptoms: anxiety, panic attacks The patient was not feeling well today.  He started feeling really earlier in the week having a fever bad headaches body ache s.  He felt a little better yesterday but feels a little bit of a resurgence in headaches and body aches and is running a low-grade fever.  His wife is feeling some of the same symptoms.  For the most part he says he is doing well.  They did increase his memory medication because they were starting to see some increasing decline in short-term memory and he said until recently his long-term memory had been good but he was starting to see  a difference in how he used to markers to remember what happened at certain times historically.  He feels the medication has helped and has brightened his mood a little also.  He is  still trying to walk every day and fish when he can and has enjoyed watching the [employer] as a good distraction.  He reports that he feels he is managing mood fairly well.  He has an appointment in 2 weeks.  He does contract for safety havi ng no thoughts of hurting himself or anyone else. We will continue to work on coping skills for helping with his anxiety and the progression of his Lewy body dementia diagnosis as well as processing his grief. 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 Speech/Language:  Normal Rate Affect: Appropriate Mood: normal Thought process: normal  Thought content:  WNL Sensory/Perceptual disturbances:  WNL Orientation: oriented to person, place, and situation Attention: Good Concentration: Good Memory: Impaired short term Fund of knowledge:  Good Insight:  Good Judgment:  Good Impulse Control: Good  Risk Assessment: Danger to Self: No Self-injurious Behavior: No Danger to Others: No Duty to Warn:no Physical Aggression / Violence:No  Access to Firearms a conc ern: No  Gang Involvement:No  Treatment plan: Will employ cognitive behavioral therapy as well as grief and loss therapy for relief of anxiety and grief.  Goals of grief therapy or to have a healthier response to the loss of his daughter and less sadness as evidenced by patient report in therapy notes as well as to help him feel less overwhelmed by her loss.  Goals are to see a 50% reduction in grief symptoms over the next 6 months.   I will encourage the use of the patient telling his grief story about his daughter including encouraging him to bring pictures and memorabilia to help process his feelings including any feelings of guilt associated with her loss.  We will use cognitive behavioral therapy to help identify and change anxiety producing thoughts and behavior patterns so as to improve  his ability to better manage anxiety and stress, and manage thoughts and w orrisome thinking contributing to anxiety.  We will also refresh  and encourage dialectical behavior therapy distress tolerance and mindfulness skills with the intention of reducing anxiety by 50% over the next 6 months. Progress: 30% Interventions: Cognitive Behavioral Therapy  Diagnosis:PTSD, Bi-Polar D/O  Plan: F/U/ appointments scheduled weekly.  Cecile Coder, [facility]                                                                                                                    Cecile Coder, [facility]  [facility]  Counselor/Therapist Progress Note  Patient ID: Herrick Hartog, MRN: [id number],    Date: 04/08/2024 This session was held via video teletherapy. The patient consented to the video teletherapy and was located in his home during this session. He is aware it is the responsibility of the patient to secure confidentiality on his end  of the session. The provider was in a private home office for the duration of this session.   The patient arrived on time for her Caregility session  Time Spent: 58 minutes, 2 PM to 2:58 PM  Treatment Type: Individual Therapy Reported Symptoms: anxiety, panic attacks A fairly difficult week because it was his daughter's birthday.  He said this year was more difficult than last year but he could not pinpoint why.  They did remem ber her about going out to a nice restaurant which is something her daughter always wanted to do.  He said they were able to laugh and joke about funny things that his daughter had said or done which is not something they were able to do this time last year so he saw that as healthy grieving.  He knows that the time change threw things off a little bit for everybody and his wife still is not feeling well and his back has been bothering  him.  He was not able to go to his first round of physical therapy this week because his back was bothering him more.  He has been thinking a lot more about his daughter which he feels comfortable with.  He is thankful that it is getting warmer and staying light longer so he can get back to some of the coping skills such as fishing that he has done in the past.   We will continue to work on coping skills for helping with his anxiety  and the progression of his Lewy body dementia diagnosis as well as processing his grief. 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 Speech/Language:  Normal  Rate Affect: Appropriate Mood: normal Thought process: normal Thought content:  WNL Sensory/Perceptual disturbances:  WNL Orientation: oriented to person, place, and situation At tention: Good Concentration: Good Memory: Impaired short term Fund of knowledge:  Good Insight:  Good Judgment:  Good Impulse Control: Good  Risk Assessment: Danger to Self: No Self-injurious Behavior: No Danger to Others: No Duty to Warn:no Physical Aggression / Violence:No  Access to Firearms a concern: No  Gang Involvement:No  Treatment plan: Will employ cognitive behavioral therapy as well as grief and loss therapy  for relief of anxiety and grief.  Goals of grief therapy or to have a healthier response to the loss of his daughter and less sadness as evidenced by patient report in therapy notes as well as to help him feel less overwhelmed by her loss.  Goals are to see a 50% reduction in grief symptoms over the next 6 months.  I will encourage the use of the patient telling his grief story about his daughter including encouraging him to bring pict ures and memorabilia to help process his feelings including any feelings of guilt associated with her loss.  We will use cognitive behavioral therapy to help identify and change anxiety producing thoughts and behavior patterns so as to improve his ability to better manage anxiety and stress, and manage thoughts and worrisome thinking contributing to anxiety.  We will also refresh and encourage dialectical behavior therapy distress  toler ance and mindfulness skills with the intention of reducing anxiety by 50% over the next 6 months. Progress: 30% Interventions: Cognitive Behavioral Therapy  Diagnosis:PTSD, Bi-Polar D/O  Plan: F/U/ appointments scheduled weekly.  Cecile Coder,  [name]                                                                                                                    Cecile Coder, [facility]               Cecile Coder, [facility]               Cecile Coder, [facility]  [place] [facility] Counselor/Therapist Progress Note  Patient ID: Stephen Myers, MRN: [id number],    Date: 04/08/2024 This session was held via video teletherapy. The patient consented to the video teletherapy and was located in his home during this session. He is aware it is the responsibility of the patient to secure confidentiality on his end of the session. The provider was in a  pri vate home office for the duration of this session.   The patient arrived on time for her Caregility session  Time Spent: 1:05 PM until 2 PM, 55 minutes Treatment Type: Individual Therapy Reported Symptoms: anxiety, panic attacks The patient said he came in from his walk this morning and saw what he thought was some sort of tic from his wife.  It was not the first time it happened and he wonders if it might be tardive dyskinesia  because of some new medications that she is on.  She has a call into the doctor right now and hopes to hear something back soon.  They have narrowed down to the fact they think most of her discomfort in breathing is coming from the diaphragm and they are looking for alternatives for treatment.  The patient continues to do those things which help him cope including walking, fishing when he can, spending time with his son and daughter-i n-law and grandson.  He also has a way of continued coping with grief he is writing letters to his daughters as a way of expressing his feelings and says that is very beneficial.  He appears to be grieving in a healthy way but he knows that he to year anniversary of her death is coming up and there is some grief there but says it is not overwhelming.  We will continue to work on coping skills for helping with his anxiety and the progr ession of his Lewy body dementia diagnosis as well as processing his grief. 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 Speech/Language:  Normal Rate Affect: Appropriate Mood: normal Thought process: normal Thought content:  WNL Sensory/Perceptual disturbances:  WNL Orientation: oriented to person, place, and situation Attention: Good  Concentration: Good Memory: Impaired short term Fund of knowledge:  Good Insight:  Good Judgment:  Good Impulse Control: Good  Risk Assessment: Danger to Self:  No Self-injurious Behavior: No Danger to Others: No Duty to Warn:no Physical Aggression / Violence:No  Access to Firearms a concern: No  Gang Involvement:No  Treatment plan: Will employ cognitive behavioral therapy as well as grief and loss therapy for relief o f anxiety and grief.  Goals of grief therapy or to have a healthier response to the loss of his daughter and less sadness as evidenced by patient report in therapy notes as well as to help him feel less overwhelmed by her loss.  Goals are to see a 50% reduction in grief symptoms over the next 6 months.  I will encourage the use of the patient telling his grief story about his daughter including encouraging him to bring pictures and memo rabilia to help process his feelings including any feelings of guilt associated with her loss.  We will use cognitive behavioral therapy to help identify and change anxiety producing thoughts and behavior patterns so as to improve his ability to better manage anxiety and stress, and manage thoughts and worrisome thinking contributing to anxiety.  We will also refresh and encourage dialectical behavior therapy distress tolerance and  mind fulness skills with the intention of reducing anxiety by 50% over the next 6 months. Progress: 30% Interventions: Cognitive Behavioral Therapy  Diagnosis:PTSD, Bi-Polar D/O  Plan: F/U/ appointments scheduled weekly.  Cecile Coder, [facility]                                                                                                                    Cecile Coder,  [facility]  [place] [facility] Counselor/Therapist Progress  Note  Patient ID: Stephen Myers, MRN: [id number],    Date: 04/08/2024 This session was held via video teletherapy. The patient consented to the video teletherapy and was located in his home during this session. He is aware it is the responsibility of the patient to secure confidentiality on his end of the session. The provider was in a private home office for the duration of this session.   The patient arrived on time for her C aregility session  Time Spent: 59 minutes, 2 PM to 2:59 PM  Treatment Type: Individual Therapy Reported Symptoms: anxiety, panic attacks The patient continues to present with physical pain and both his back and his knee.  He started  with a new physical therapist and told him that there was a lot of arthritis in both knees and or certain things he cannot do but they had him do that anyway and now for the past 3 days he has been in s ignificant knee pain.  He has continued to walk because he knows that is good for him in various ways but says it has been painful.  His wife also was not feeling well but she is going back to the doctor next week to have an ablation and hopes that will bring her some relief.  For the most part he has been at the last couple weeks reaching back out and hearing from some old friends as well as time with his son and son's family.  He says  that his wife's cousin is coming down for a weekend soon and they enjoy time with her and that he will go to his brother's house sometime in the next few weeks.  He recognizes the need for people that he cares about in helping with his depression. We will continue to work on coping skills for helping with his anxiety and the progression of his Lewy body dementia diagnosis as well as processing his grief. Mental Status Exam: Appearan ce:  Well Groomed  Behavior: Appropriate Motor: Pt. Reports some instability due to Lewey Body dementia Speech/Language:  Normal Rate Affect: Appropriate Mood: normal Thought process: normal Thought content:  WNL Sensory/Perceptual disturbances:  WNL Orientation: oriented to person, place, and situation Attention: Good Concentration: Good Memory: Impaired short term Fund of knowledge:  Good Insight:  Good Judgment:   Good Impulse Control: Good  Risk Assessment: Danger to Self: No Self-injurious Behavior: No Danger to Others: No Duty to Warn:no Physical Aggression / Violence:No  Access to Firearms a concern: No  Gang Involvement:No  Treatment plan: Will employ cognitive behavioral therapy as well as grief and loss therapy for relief of anxiety and grief.  Goals of grief therapy or to have a healthier response to the loss  of his daughter a nd less sadness as evidenced by patient report in therapy notes as well as to help him feel less overwhelmed by her loss.  Goals are to see a 50% reduction in grief symptoms over the next 6 months.  I will encourage the use of the patient telling his grief story about his daughter including encouraging him to bring pictures and memorabilia to help process his feelings including any feelings of guilt associated with her loss.  We will us  e cognitive behavioral therapy to help identify and change anxiety producing thoughts and behavior patterns so as to improve his ability to better manage anxiety and stress, and manage thoughts and worrisome thinking contributing to anxiety.  We will  also refresh and encourage dialectical behavior therapy distress tolerance and mindfulness skills with the intention of reducing anxiety by 50% over the next 6 months. Progress: 30% Inter ventions: Cognitive Behavioral Therapy  Diagnosis:PTSD, Bi-Polar D/O  Plan: F/U/ appointments scheduled weekly.  Cecile Coder, [facility]                                                                                                                   Cecile Coder, [facility]               Cecile Coder, [facility]               Cecile Coder, [facility]               Cecile Coder, [facility]  [place] [facility] Counselor/Therapist Progress Note  Patient ID: Stephen Myers, MRN: [id number],    Date: 04/08/2024 This session was held via video teletherapy. The patient consented to the video teletherapy and was located in his home during this session. He is aware it is the responsibility of the patient to secure confidentiality on his end of the session. The provider was in a private home office for the duration of this session.    The patient arrived on time for her Caregility session  Time Spent: 57 minutes, 2 PM to 2:57 PM  Treatment Type: Individual Therapy Reported Symptoms: anxiety, panic attacks The patient is still having significant back pain.  He cannot sit anywhere for very long and has to be careful where he sits.  There is still some unsteadiness although he did not report any falls over the past 2 weeks.  His biggest concern now is for his wife.   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  She is meeting with her cardiologist again next week to  see what possible treatment is for her.  He still thinks there may be some issue with her diaphragm but she is meeting with the pulmonologist also.  He is doing what he can to help her.  His daughter's birthday is March 11 so he knows that is a difficult  time for both he and his wife next week.  Typically they do something to remember her but because of the wife's current health issues they cannot go to [place] as they had planned so they are talking about what to do to remember and celebrate her birthday.  We will continue to work on coping skills for helping with his anxiety and the progression of his Lewy body dementia diagnosis as well as processing his grief. Mental Status  Exam: Appearance:  Well Groomed  Behavior: Appropriate Motor: Pt. Reports some instability due to Lewey Body dementia Speech/Language:  Normal Rate Affect: Appropriate Mood: normal Thought process: normal Thought content:  WNL Sensory/Perceptual disturbances:  WNL Orientation: oriented to person, place, and situation Attention: Good Concentration: Good Memory: Impaired short term Fund of knowledge:  Good Insight:  G ood Judgment:  Good Impulse Control: Good  Risk Assessment: Danger to Self: No Self-injurious Behavior: No Danger to Others: No Duty to Warn:no Physical Aggression / Violence:No  Access to Firearms a concern: No  Gang Involvement:No  Treatment plan: Will employ cognitive behavioral therapy as well as grief and loss therapy for relief of anxiety and grief.  Goals of grief therapy or to have a healthier response to the loss o f his daughter and less sadness as evidenced by patient report in therapy notes as well as to help him feel less overwhelmed by her loss.  Goals are to see a 50% reduction in grief symptoms over the next 6 months.  I will encourage the use of the patient telling his grief story about his daughter including encouraging him to bring pictures and memorabilia to help  process his feelings including any feelings of guilt associated with her l oss.  We will use cognitive behavioral therapy to help identify and change anxiety producing thoughts and behavior patterns so as to improve his ability to better manage anxiety and stress, and manage thoughts and worrisome thinking contributing to anxiety.  We will also refresh and encourage dialectical behavior therapy distress tolerance and mindfulness  skills with the intention of reducing anxiety by 50% over the next 6 months. Prog ress: 30% Interventions: Cognitive Behavioral Therapy  Diagnosis:PTSD, Bi-Polar D/O  Plan: F/U/ appointments scheduled weekly.  Cecile Coder, [facility]                                                                                                                   Cecile Coder, [facility]  [place] [facility] Counselor/Therapist Progress Note  Patient ID: Kshawn Canal, MRN: [id number],    Date: 04/08/2024 This session w as held via video teletherapy. The patient consented to the video teletherapy and was located in his home during this session. He is aware it is the responsibility of the patient to secure confidentiality on his end of the session. The provider was in a private home office for the duration of this session.   The patient arrived on time for her Caregility session  Time Spent: 58 minutes, 2 PM to 2:58 PM  Treatment Type: Individual  Therapy Reported Symptoms: anxiety, panic attacks A fairly difficult week because it was his daughter's birthday.  He said this year was more difficult than last year but he could not pinpoint why.  They did remember her about going out to a nice restaurant which is something her daughter always wanted to do.  He said they were able to laugh and joke about funny things that his daughter had said or done which is not something they wer e able to do this time last year so he saw that as healthy grieving.  He knows that the time change threw things off a little bit for everybody and his wife still is not feeling well and his back has been bothering him.  He was not able to go to his first round of physical therapy this week because his back was bothering him more.  He has been thinking a lot more about his daughter which he feels comfortable with.  He is thankful that i [place] is getting warmer and staying light longer so he can get back to some of the coping skills such as fishing that he has done in the past.   We will continue to work on coping skills  for helping with his anxiety and the progression of his Lewy body dementia diagnosis as well as processing his grief. Mental Status Exam: Appearance:  Well Groomed  Behavior: Appropriate Motor: Pt. Reports some instability due to Lewey Body dementi a Speech/Language:  Normal Rate Affect: Appropriate Mood: normal Thought process: normal Thought content:  WNL Sensory/Perceptual disturbances:  WNL Orientation: oriented to person, place, and situation Attention: Good Concentration: Good Memory: Impaired short term Fund of knowledge:  Good Insight:  Good Judgment:  Good Impulse Control: Good  Risk Assessment: Danger to Self: No Self-injurious Behavior: No Danger  to Others: No Duty to Warn:no Physical Aggression / Violence:No  Access to Firearms a concern: No  Gang Involvement:No  Treatment plan: Will employ cognitive behavioral therapy as well as grief and loss therapy for relief of anxiety and grief.  Goals of grief therapy or to have a healthier response to the loss of his daughter and less sadness as evidenced by patient report in therapy notes as well as to help him feel less overwhelm ed by her loss.  Goals are to see a 50% reduction in grief symptoms over the next 6 months.  I will encourage the use of the patient telling his grief story about his daughter including encouraging him to bring pictures and memorabilia to help process his feelings including any feelings of guilt associated with her loss.  We will use cognitive behavioral therapy to help identify and change anxiety producing thoughts and behavior pattern s so as to improve his ability to better manage anxiety and stress, and manage thoughts and worrisome thinking contributing to anxiety.  We will also refresh and encourage dialectical behavior therapy distress  tolerance and mindfulness skills with the intention of reducing anxiety by 50% over the next 6 months. Progress: 30% Interventions: Cognitive Behavioral  Therapy  Diagnosis:PTSD, Bi-Polar D/O  Plan: F/U/ appointments schedule d weekly.  Cecile Coder, [facility]                                                                                                                   Cecile Coder, [facility]               Cecile Coder, [facility]               Cecile Coder, [facility]  [place] [facility] Counselor/Therapist Progress Note  Patient ID: Stephen Myers, MRN: [id number],    Date: 04/08/2024 This session was held via video teletherapy.  The patient consented to the video teletherapy and was located in his home during this session. He is aware  it is the responsibility of the patient to secure confidentiality on his end of the session. The provider was in a private home office for the duration of this session.   The patient arrived on time for her Caregility session  Time Spent: 57 minutes, 2 PM to 2:57 PM  Treatment Type: Individual Therapy Reported Symptoms: an xiety, panic attacks The patient is still having significant back pain.  He cannot sit anywhere for very long and has to be careful where he sits.  There is still some unsteadiness although he did not report any falls over the past 2 weeks.  His biggest concern now is for his wife.  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  She is meeting with her cardio logist again next week to see what possible treatment is for her.  He still thinks there may be some issue with her diaphragm but she is meeting with the pulmonologist also.  He is doing what he can to help her.  His daughter's birthday is March 11 so he knows that is a difficult time for both he and his wife next week.  Typically they do something to remember her but because of the wife's current health issues [place] [facility] [place] they had planned so they are talking about what to do to remember and celebrate her birthday.  We will continue to work on coping skills for helping with his anxiety and the progression of his Lewy body dementia diagnosis as well as processing his grief. 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 Speech/Language:  Normal Rate A ffect: Appropriate Mood: normal Thought process: normal Thought content:  WNL Sensory/Perceptual disturbances:  WNL Orientation: oriented to person, place, and situation Attention: Good Concentration: Good Memory: Impaired short term Fund of knowledge:  Good Insight:   Good Judgment:  Good Impulse Control: Good  Risk Assessment: Danger to Self: No Self-injurious Behavior: No Danger to Others: No Duty to Warn:no Phy sical Aggression / Violence:No  Access to Firearms a concern: No  Gang Involvement:No  Treatment plan: Will employ cognitive behavioral therapy as well as grief and loss therapy for relief of anxiety and grief.  Goals of grief therapy or to have a healthier response to the loss of his daughter and less sadness as evidenced by patient report in therapy notes as well as to help him feel less overwhelmed by her loss.  Goals are to see a  50% reduction in grief symptoms over the next 6 months.  I will encourage the use of the patient telling his grief story about his daughter including encouraging him to bring pictures and memorabilia to help process his feelings including any feelings of guilt associated with her loss.  We will use cognitive behavioral therapy to help identify and change anxiety producing thoughts and behavior patterns so as to improve his ability to b etter manage anxiety and stress, and manage thoughts and worrisome thinking contributing to anxiety.  We will also refresh and encourage dialectical behavior therapy distress tolerance and mindfulness  skills with the intention of reducing anxiety by 50% over the next 6 months. Progress: 30% Interventions: Cognitive Behavioral Therapy  Diagnosis:PTSD, Bi-Polar D/O  Plan: F/U/ appointments scheduled weekly.  Cecile Coder, [facility]                                                                                                                    Cecile Coder, [facility]  [place] [facility] Counselor/Therapist Progress Note  Patient ID: Matisse Salais, MRN: [id number],    Date: 04/08/2024 This session was held via video teletherapy. The patient consented to the video teletherapy and was located in his home during this session. He is aware it is the responsi bility of the patient to secure confidentiality on his end of the session. The provider was in a private home office for the duration of this session.   The patient arrived on time for her Caregility session  Time Spent: 57 minutes, 2 PM to 2:57 PM  Treatment Type: Individual Therapy Reported Symptoms: anxiety, panic attacks  The patient reported that he had not been sleeping well so he did speak with his psychiatrist about adj usting some meds.  Initially they adjusted too much up and he said he felt groggy for the entire next day but they have found a combination now where he feels like he is getting more  rested and only feels a little drowsy shortly after waking up..  There have been a couple times where he stumbled and one of the times he felt down but did not get hurt the other times he was able to catch himself.  He is trying to stay as active as he can  walking daily and fishing with his wife when he can.  His biggest anxiety now is his wife has an upcoming surgery on her diaphragm in 2 weeks so he knows that will be a fairly significant recovery time but also sees the difficulties she is having now and wants her to get some relief..  His son and daughter-in-law will be supports for them also.  He continues to speak with friends and family members as encouragement.  He also feels that  he is grieving appropriately writing to his daughter on a regular basis about the things that are going on in his and his wife's lives.  We will continue to work on coping skills for helping with his anxiety and the progression of his Lewy body dementia diagnosis as well as processing his grief. 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 Sp eech/Language:  Normal Rate Affect: Appropriate Mood: normal Thought process: normal Thought content:  WNL Sensory/Perceptual disturbances:  WNL Orientation: oriented to person, place, and situation Attention: Good Concentration: Good Memory: Impaired short term Fund of knowledge:  Good Insight:  Good Judgment:  Good Impulse Control: Good  Risk Assessment: Danger to Self: No Self-injurious Behavior: No Danger to Ot hers: No Duty to Warn:no Physical Aggression / Violence:No  Access to Firearms a concern: No  Gang Involvement:No  Treatment plan: Will employ cognitive behavioral therapy as well as grief and loss therapy for relief of anxiety and grief.  Goals of grief therapy or to have a healthier response to the loss of his daughter and less sadness as evidenced by patient  report in therapy notes as well as to help him feel less overwhelmed by  her loss.  Goals are to see a 50% reduction in grief symptoms over the next 6 months.  I will encourage the use of the patient telling his grief story about his daughter including encouraging him to bring pictures and memorabilia to help process his feelings including any feelings of guilt associated with her loss.  We will use cognitive behavioral therapy to help identify and change anxiety producing thoughts and behavior patterns so  as to improve his ability  to better manage anxiety and stress, and manage thoughts and worrisome thinking contributing to anxiety.  We will also refresh and encourage dialectical behavior therapy distress tolerance and mindfulness skills with the intention of reducing anxiety by 50% over the next 6 months. Progress: 30% Interventions: Cognitive Behavioral Therapy  Diagnosis:PTSD, Bi-Polar D/O  Plan: F/U/ appointments scheduled wee kly.  Cecile Coder, [facility]                                                                                                                   Cecile Coder, [facility]               Cecile Coder, [facility]               Cecile Coder, [facility]               Cecile Coder, [facility]                Cecile Coder, [facility]               Cecile Coder, [facility]               Cecile Coder, [facility]               Cecile Coder, [facility]  [place] [facility] Counselor/Therapist Progress Note  Patient ID: Kaevion Sinclair, MRN: [id number],    Date: 04/08/2024 This session was held via video teletherapy. The patient consented to the video teletherapy and was located in his home during this session. He is aware it is the responsibility of the patient to secure confidentiality on his end of Stephen Myers he session. The provider was in a private home office for the duration of this session.   The patient arrived on time for her Caregility session  Time Spent: 59 minutes, 2 PM to 2:59 PM  Treatment Type: Individual Therapy Reported Symptoms: anxiety, panic attacks The patient said he jokingly said to his wife that he had not had any pain issues in the cervical area and then a few days ago he started having pain in his neck and sh oulders.  He now sits related to degenerative disk and has some consistent pain somewhere in his spine with that but jokingly says it just depends on Wednesday which part of his body is  decides to show up and hurt.  He is doing the best that he can.  He is still walking which he knows is good for him but does not have right now the strength to do as much in the morning as he has done in the past so he is denying that to 3 times a day fo r consistency.  He is still doing some positive things and that he is staying in touch with his brother and other family members.  He is reconnecting with friends from high school and he is which she is enjoying.  One of them has had a similar experience in that she lost her son in the same way the patient lost his daughter.  He said they did not think and Easter in which they set a place for his daughter at the table and put her pictur e there.  He initially had reservations about doing that he and his wife had made everyone feel as if she was really there with him.  He still feels that he is grieving in a healthy way.  He and his wife have always taken a trip around both of their birthdays which are 2 days apart.  That is now close to his daughter died so they have started taking some of her rashes with him and sprinkling them where they go as a way of remembrance.   They are deciding what that looks like this year in July.  He does contract for safety having no thoughts of hurting himself or anyone else. We will continue to work on coping skills for helping with his anxiety and the progression of his Lewy body dementia diagnosis as well as processing his grief. Mental Status Exam: Appearance:  Well Groomed  Behavior: Appropriate Motor: Pt. Reports some instability due to Lewey Body dementi a Speech/Language:  Normal Rate Affect: Appropriate Mood: normal Thought process: normal Thought content:  WNL Sensory/Perceptual disturbances:  WNL Orientation: oriented to person, place, and situation Attention: Good Concentration: Good Memory: Impaired short term Fund of knowledge:  Good Insight:  Good Judgment:  Good Impulse Control: Good  Risk  Assessment: Danger to Self: No Self-injurious Behavior: No Danger  to Others: No Duty to Warn:no Physical Aggression / Violence:No  Access to Firearms a concern: No  Gang Involvement:No  Treatment plan: Will employ cognitive behavioral therapy as well as grief and loss therapy for relief of anxiety and grief.  Goals of grief therapy  or to have a healthier response to the loss of his daughter and less sadness as evidenced by patient report in therapy notes as well as to help him feel less overwhelm ed by her loss.  Goals are to see a 50% reduction in grief symptoms over the next 6 months.  I will encourage the use of the patient telling his grief story about his daughter including encouraging him to bring pictures and memorabilia to help process his feelings including any feelings of guilt associated with her loss.  We will use cognitive behavioral therapy to help identify and change anxiety producing thoughts and behavior pattern s so as to improve his ability to better manage anxiety and stress, and manage thoughts and worrisome thinking contributing to anxiety.  We will also refresh and encourage dialectical behavior therapy distress tolerance and mindfulness skills with the intention of reducing anxiety by 50% over the next 6 months. Progress: 30% Interventions: Cognitive Behavioral Therapy  Diagnosis:PTSD, Bi-Polar D/O  Plan: F/U/ appointments schedule d weekly.  Cecile Coder, [facility]                                                                                                                   Cecile Coder, [facility]  [place] [facility] Counselor/Therapist Progress Note  Patient ID: Jamonte Curfman, MRN: [id number],    Date: 04/08/2024 This session was held via video teletherapy. The patient consented to the video teletherapy and was located in his home during this sess ion. He is aware it is the responsibility of the patient to secure confidentiality on his end of the session. The provider was in a private home office for the duration of this session.   The patient arrived on time for her Caregility session  Time Spent: 58 minutes, 2 PM to 2:58 PM  Treatment Type: Individual Therapy Reported Symptoms: anxiety, panic attacks A fairly difficult week because it was his daughter's birthday.  He sa id this year was more difficult than last year but he could not pinpoint why.  They did remember her about going out to a nice restaurant which is something her daughter always wanted  to do.  He said they were able to laugh and joke about funny things that his daughter had said or done which is not something they were able to do this time last year so he saw that as healthy grieving.  He knows that the time change threw things off a lit tle bit for everybody and his wife still is not feeling well and his back has been bothering him.  He was not able to go to his first round of physical therapy this week because his back was bothering him more.  He has been thinking a lot more about his daughter which he feels comfortable with.  He is thankful that it is getting warmer and staying light longer so he can get back to some of the coping skills such as fishing that he has d one in the past.   We will continue to work on coping skills for helping with his anxiety and the progression of his Lewy body dementia diagnosis as well as processing his grief. 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 Speech/Language:  Normal Rate Affect: Appropriate Mood: normal Thought process: normal Thought content:  WNL Sens ory/Perceptual disturbances:  WNL Orientation: oriented to person, place, and situation Attention: Good Concentration: Good Memory: Impaired short term Fund of knowledge:  Good Insight:  Good Judgment:  Good Impulse Control: Good  Risk Assessment: Danger to Self: No Self-injurious Behavior: No Danger to Others: No Duty to Warn:no Physical Aggression / Violence:No  Access to Firearms a concern: No  Gang Involvement:No   Treatment plan: Will employ cognitive behavioral therapy as well as grief and loss therapy for relief of anxiety and grief.  Goals of grief therapy or to have a healthier response to the loss of his daughter and less sadness as evidenced by patient report in therapy notes as well as to help him feel less overwhelmed by her loss.  Goals are to see a 50% reduction  in grief symptoms over the next 6 months.  I will encourage the use of [place] he patient telling his grief story about his daughter including encouraging him to bring pictures and memorabilia to help process his feelings including any feelings of guilt associated with her loss.  We will use cognitive behavioral therapy to help identify and change anxiety producing thoughts and behavior patterns so as to improve his ability to better manage anxiety and stress, and manage thoughts and worrisome thinking contributin g to anxiety.  We will also refresh and encourage dialectical behavior therapy  distress tolerance and mindfulness skills with the intention of reducing anxiety by 50% over the next 6 months. Progress: 30% Interventions: Cognitive Behavioral Therapy  Diagnosis:PTSD, Bi-Polar D/O  Plan: F/U/ appointments scheduled weekly.  Cecile Coder, [facility]                                                                                                                    Cecile Coder, [facility]               Cecile Coder, [facility]               Cecile Coder, [facility]  [facility] Counselor/Therapist Progress Note  Patient ID: Fielding Mault, MRN: [id number],    Date: 04/08/2024 This session was held via video teletherapy. The patient consented to the video teletherapy and was located in his home during this session. He is aware it is the responsibility  of the patient to secure confidentiality on his end of the session. The provider was in a private home office for the duration of this session.   The patient arrived on time for her Caregility session  Time Spent: 1:05 PM until 2 PM, 55 minutes Treatment Type: Individual Therapy Reported Symptoms: anxiety, panic attacks The patient said he came in from his walk this morning and saw what he thought was some sort of tic from his w ife.  It was not the first time it happened and he wonders if it might be tardive dyskinesia because of some new medications that she is on.  She has a call into the doctor right now and hopes to hear something back soon.  They have narrowed down to the fact they think most of her discomfort in breathing is coming from the diaphragm and they are looking for alternatives for treatment.  The patient continues to do those things which he lp him cope including walking, fishing when he can, spending time with his son and daughter-in-law and grandson.  He also has a way of continued coping with grief he is writing letters  to his daughters as a way of expressing his feelings and says that is very beneficial.  He appears to be grieving in a healthy way but he knows that he to year anniversary of her death is coming up and there is some grief there but says it is not overwhel ming.  We will continue to work on coping skills for helping with his anxiety and the progression of his Lewy body dementia diagnosis as well as processing his grief. 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 Speech/Language:  Normal Rate Affect: Appropriate Mood: normal Thought process: normal Thought content:  WNL Sensory/Perceptua l disturbances:  WNL Orientation: oriented to person, place, and situation Attention: Good Concentration: Good Memory: Impaired short term Fund of knowledge:  Good Insight:  Good Judgment:  Good Impulse Control: Good  Risk Assessment: Danger to Self: No Self-injurious Behavior: No Danger to Others: No Duty to Warn:no Physical Aggression / Violence:No  Access to Firearms a concern: No  Gang Involvement:No  Treatment  plan: Will employ cognitive behavioral therapy as well as grief and loss therapy for relief of anxiety and grief.  Goals of grief therapy or to have a healthier response to the loss of his daughter and less sadness as evidenced by patient report in therapy notes as well as to help him feel less overwhelmed by her loss.  Goals are to see a 50% reduction in grief symptoms over the next 6 months.  I will encourage the use of the patient te lling his grief story about his daughter including encouraging him to bring pictures and memorabilia to help process his feelings including any feelings of guilt associated with her loss.  We will use cognitive behavioral therapy to help identify and change anxiety producing thoughts and behavior patterns so as to improve his ability to better manage anxiety and  stress, and manage thoughts and worrisome thinking contributing to anxiety.   We will also refresh and encourage dialectical behavior therapy distress tolerance  and mindfulness skills with the intention of reducing anxiety by 50% over the next 6 months. Progress: 30% Interventions: Cognitive Behavioral Therapy  Diagnosis:PTSD, Bi-Polar D/O  Plan: F/U/ appointments scheduled weekly.  Cecile Coder, [facility]                                                                                                                    Cecile Coder, [facility]  [facility] Counselor/Therapist Progress Note  Patient  ID: Stephen Myers, MRN: [id number],    Date: 04/08/2024 This session was held via video teletherapy. The patient consented to the video teletherapy and was located in his home during this session. He is aware it is the responsibility of the patient to secure confidentiality on his end of the session. The provider was in a pri vate home office for the duration of this session.   The patient arrived on time for her Caregility session  Time Spent: 59 minutes, 2 PM to 2:59 PM  Treatment Type: Individual Therapy Reported Symptoms: anxiety, panic attacks The patient continues to present with physical pain and both his back and his knee.  He started with a new physical therapist and told him that there was a lot of arthritis in both knees and or certain thi ngs he cannot do but they had him do that anyway and now for the past 3 days he has been in significant knee pain.  He has continued to walk because he knows that is good for him in various ways but says it has been painful.  His wife also was not feeling well but she is going back to the doctor next week to have an ablation and hopes that will bring her some relief.  For the most part he has been at the last couple weeks reaching back  out and hearing from some old friends as well as time with his son and son's family.  He says that his wife's cousin is coming down for a weekend soon and they enjoy time with her and that he will go to his brother's house sometime in the next few weeks.  He recognizes the need for people that he cares about in helping with his depression. We will continue to work on coping skills for helping with his anxiety and the progression of his  Lewy body dementia diagnosis as well as processing his grief. 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 Speech/Language:  Normal Rate Affect: Appropriate Mood: normal Thought  process: normal Thought content:  WNL Sensory/Perceptual disturbances:  WNL Orientation: oriented to person, place, and situation Attention: Good Concentrati on: Good Memory: Impaired short term Fund of knowledge:  Good Insight:  Good Judgment:  Good Impulse Control: Good  Risk Assessment: Danger to Self: No Self-injurious Behavior: No Danger to Others: No Duty to Warn:no Physical Aggression / Violence:No  Access to Firearms a concern: No  Gang Involvement:No  Treatment plan: Will employ cognitive behavioral therapy as well as grief and loss therapy for relief of anxiety and  grief.  Goals of grief therapy or to have a healthier response to the loss of his daughter and less sadness as evidenced by patient report in therapy notes as well as to help him feel less overwhelmed by her loss.  Goals are to see a 50% reduction in grief symptoms over the next 6 months.  I will encourage the use of the patient telling his grief story about his daughter including encouraging him to bring pictures and memorabilia to he lp process his feelings including any feelings of guilt associated with her loss.  We will use cognitive behavioral therapy to help identify and change anxiety producing thoughts and behavior patterns so as to improve his ability to better manage anxiety and stress, and manage thoughts and worrisome thinking contributing to anxiety.  We will also  refresh and encourage dialectical behavior therapy distress tolerance and mindfulness skill s with the intention of reducing anxiety by 50% over the next 6 months. Progress: 30% Interventions: Cognitive Behavioral Therapy  Diagnosis:PTSD, Bi-Polar D/O  Plan: F/U/ appointments scheduled weekly.  Cecile Coder, [facility]                                                                                                                     Cecile Coder, [facility]               Cecile Coder, [facility]               Cecile Coder, [facility]               Cecile Coder, [facility]  [place] [facility] Counselor/Therapist Progress Note  Patient ID: Stephen Myers, MRN: [id number],    Date: 04/08/2024 This session was held via video teletherapy. The patient consented to the video teletherapy and was located in his home during this session. He is aware it is the responsibility of the patient to secure confidentiality on his end of Stephen Myers he session. The provider was in a private home office for the duration of this session.   The patient arrived on time for her Caregility session  Time Spent: 57 minutes, 2 PM to  2:57 PM  Treatment Type: Individual Therapy Reported Symptoms: anxiety, panic attacks The patient is still having significant back pain.  He cannot sit anywhere for very long and has to be careful where he sits.  There is still some unsteadiness although  he did not report any falls over the past 2 weeks.  His biggest concern now is for his wife.  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  She is meeting with her cardiologist again next week to see what possible treatment is for her.  He still thinks there may be some issue with her diaphragm but she is meeting with the pulmonologist also.  He is doing wha Stephen Myers he can to help her.  His daughter's birthday is March 11 so he knows that is a difficult time for both he and his wife next week.  Typically they do something to remember her but because of the wife's current health issues they cannot go to [place] as they had planned so they are talking about what to do to remember and celebrate her birthday.  We will continue to work on coping skills for helping with his anxiety and the pr ogression of his Lewy body dementia diagnosis as well as processing his grief. 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 Speech/Language:  Normal Rate Affect: Appropriate Mood: normal Thought process: normal Thought content:  WNL Sensory/Perceptual disturbances:  WNL Orientation: oriented to person, place, and situation Attention: G ood Concentration: Good Memory: Impaired short term Fund of knowledge:  Good Insight:  Good Judgment:  Good Impulse Control: Good  Risk Assessment: Danger to Self: No Self-injurious Behavior: No Danger to Others: No Duty to Warn:no Physical Aggression / Violence:No  Access to Firearms a concern: No  Gang Involvement:No  Treatment plan: Will employ  cognitive behavioral therapy as well as grief and loss therapy for relie f of anxiety and grief.  Goals of grief therapy or to have a healthier response to the loss of his daughter and less sadness as evidenced by patient report in therapy notes as well as to help him feel less overwhelmed by her loss.  Goals are to see a 50% reduction in grief symptoms over the next 6 months.  I will encourage the use of the patient telling his grief story about his daughter including encouraging him to bring pictures and m emorabilia to help process his feelings including any feelings of guilt associated with her loss.  We will use cognitive behavioral therapy to help identify and change anxiety producing thoughts and behavior patterns so as to improve his ability to better manage anxiety and stress, and manage thoughts and worrisome thinking contributing to anxiety.  We will also refresh and encourage dialectical behavior therapy distress tolerance and m indfulness  skills with the intention of reducing anxiety by 50% over the next 6 months. Progress: 30% Interventions: Cognitive Behavioral Therapy  Diagnosis:PTSD, Bi-Polar D/O  Plan: F/U/ appointments scheduled weekly.  Cecile Coder, [facility]                                                                                                                    Cecile Coder, [facility]  [facility] Counselor/Therapist Progre ss Note  Patient ID: Nephi Savage, MRN: [id number],    Date: 04/08/2024 This session was held via video teletherapy. The patient consented to the video teletherapy and was located in his home during this session. He is aware it is the responsibility of the patient to secure confidentiality on his end of the session. The provider was in a private home office for the duration of this session.   The patient arrived on time for he r Caregility session  Time Spent: 58 minutes, 2 PM to 2:58 PM  Treatment Type: Individual Therapy Reported Symptoms: anxiety, panic attacks A fairly difficult week because it was his daughter's birthday.  He said this year was more difficult than last year but he could not pinpoint why.  They did remember her about going out to a nice restaurant which is something her daughter always wanted to do.  He said they were able to laugh a nd joke about funny things that his daughter had said or done which is not something they were able to do this time last year so he saw that as healthy grieving.  He knows that the time  change threw things off a little bit for everybody and his wife still is not feeling well and his back has been bothering him.  He was not able to go to his first round of physical therapy this week because his back was bothering him more.  He has been Stephen Myers hinking a lot more about his daughter which he feels comfortable with.  He is thankful that it is getting warmer and staying light longer so he can get back to some of the coping skills such as fishing that he has done in the past.   We will continue to work on coping skills for helping with his anxiety and the progression of his Lewy body dementia diagnosis as well as processing his grief. 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 Speech/Language:  Normal Rate Affect: Appropriate Mood: normal Thought process: normal Thought content:  WNL Sensory/Perceptual disturbances:  WNL Orientation: oriented to person, place, and situation Attention: Good Concentration: Good Memory: Impaired short term Fund of knowledge:  Good Insight:  Good Judgment:  Good Impulse  Control: Good  Risk Assessment: Danger to Self: No Self-injurious Behavior: No Danger to Others: No Duty to Warn:no Physical Aggression / Violence:No  Access to Firearms a concern: No  Gang Involvement:No  Treatment plan: Will employ cognitive behavioral therapy as well as grief and loss therapy for relief of anxiety and grief.  Goals of grief therapy or to have a healthier response to the loss of his daughter and less sadne ss as evidenced by patient report in therapy notes as well as to help him feel less overwhelmed by her loss.  Goals are to see a 50% reduction in grief symptoms over the next 6 months.  I will encourage the use of the patient telling his grief story about his daughter including encouraging him to bring pictures and memorabilia to help process his feelings including any  feelings of guilt associated with her loss.  We will use cognitive b ehavioral therapy to help identify and change anxiety producing thoughts and behavior patterns so as to improve his ability to better manage anxiety and stress, and manage thoughts and worrisome thinking contributing to anxiety.  We will also refresh and encourage dialectical behavior therapy  distress tolerance and mindfulness skills with the intention of reducing anxiety by 50% over the next 6 months. Progress: 30% Interventions: Cog nitive Behavioral Therapy  Diagnosis:PTSD, Bi-Polar D/O  Plan: F/U/ appointments scheduled weekly.  Cecile Coder, [facility]                                                                                                                   Cecile Coder, [facility]               Cecile Coder, [facility]                Cecile Coder, [facility]  [place] [facility] Counselor/Therapist Progress Note  Patient ID: Ayan Heffington, MRN: [id number],    Date: 04/08/2024 This session was held via video teletherapy. The patient consented to the video teletherapy and was located in his home during this session. He is aware it is the responsibility of the patient to secure confidentiality on his end of the session. The provider was in a private home office for the duration of this session.   The patient arrived on time for her Caregility session  Time S pent: 57 minutes, 2 PM to 2:57 PM  Treatment Type: Individual Therapy Reported Symptoms: anxiety, panic attacks The patient is still having significant back pain.  He cannot sit anywhere for very long and has to be careful where he sits.  There is still some unsteadiness although he did not report any falls over the past 2 weeks.  His biggest concern now is for his wife.  She has been having some difficulty breathing and they did a Stephen Myers est so they found a small hole on the back side of her heart.  She is meeting with her cardiologist again next week to see what possible treatment is for her.  He still thinks there may be some issue with her diaphragm but she is meeting with the pulmonologist also.  He is doing what he can to help her.  His daughter's birthday is March 11 so he knows that is a difficult time for both he and his wife next week.  Typically they do some thing to remember her but because of the wife's current health issues they cannot go to [place] as they had planned so they are talking about what to do to remember and celebrate  her birthday.  We will continue to work on coping skills for helping with his anxiety and the progression of his Lewy body dementia diagnosis as well as processing his grief. Mental Status Exam: Appearance:  Well Groomed  Behavior: Appropriate Mot or: Pt. Reports some instability due to Lewey Body dementia Speech/Language:  Normal Rate Affect: Appropriate Mood: normal Thought process: normal Thought content:  WNL Sensory/Perceptual disturbances:  WNL Orientation: oriented to person, place, and situation Attention: Good Concentration: Good Memory: Impaired short term Fund of knowledge:  Good Insight:  Good Judgment:  Good Impulse Control: Good  Risk Assessment:  Danger to Self: No Self-injurious Behavior: No Danger to Others: No Duty to Warn:no Physical Aggression / Violence:No  Access to Firearms a concern: No  Gang Involvement:No  Treatment plan: Will employ cognitive behavioral therapy as well as grief and loss therapy for relief of anxiety and grief.  Goals of grief therapy or to have a healthier response to the loss of his daughter and less sadness as evidenced by patient report i n therapy notes as well as to help him feel less overwhelmed by her loss.  Goals are to see a 50% reduction in grief symptoms over the next 6 months.  I will encourage the use of the patient telling his grief story about his daughter including encouraging him to bring pictures and memorabilia to help process his feelings including any feelings of guilt associated with her loss.  We will use cognitive behavioral therapy to help identify  and change anxiety producing thoughts and behavior patterns so as to improve his ability to better manage anxiety and stress, and manage thoughts and worrisome thinking contributing to anxiety.  We will also refresh and encourage dialectical behavior therapy distress tolerance and mindfulness skills  with the intention of reducing anxiety by 50% over the next 6  months. Progress: 30% Interventions: Cognitive Behavioral Therapy  Diagno GNF:AOZH, Bi-Polar D/O  Plan: F/U/ appointments scheduled weekly.  Cecile Coder, [facility]                                                                                                                   Cecile Coder, [facility]  [place] [facility] Counselor/Therapist Progress Note  Patient ID: Schuyler Olden, MRN: [id number],    Date: 04/08/2024 This session was held via video teletherapy. The patient consented to the vide o teletherapy and was located in his home during this session. He is aware it is the responsibility of the  patient to secure confidentiality on his end of the session. The provider was in a private home office for the duration of this session.   The patient arrived on time for her Caregility session  Time Spent: 57 minutes, 2 PM to 2:57 PM  Treatment Type: Individual Therapy Reported Symptoms: anxiety, panic attacks  We will co ntinue to work on coping skills for helping with his anxiety and the progression of his Lewy body dementia diagnosis as well as processing his grief. 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 Speech/Language:  Normal Rate Affect: Appropriate Mood: normal Thought process: normal Thought content:  WNL Sensory/Perceptual disturbances:  W NL Orientation: oriented to person, place, and situation Attention: Good Concentration: Good Memory: Impaired short term Fund of knowledge:  Good Insight:  Good Judgment:  Good Impulse Control: Good  Risk Assessment: Danger to Self: No Self-injurious Behavior: No Danger to Others: No Duty to Warn:no Physical Aggression / Violence:No  Access to Firearms a concern: No  Gang Involvement:No  Treatment plan: Will employ c ognitive behavioral therapy as well as grief and loss therapy for relief of anxiety and grief.  Goals of grief therapy or to have a healthier response to the loss of his daughter and less sadness as evidenced by patient report in therapy notes as well as to help him feel less overwhelmed by her loss.  Goals are to see a 50% reduction in grief symptoms over the next 6 months.  I will encourage the use of the patient telling his grief sto ry about his daughter including encouraging him to bring pictures and memorabilia to help process his feelings including any feelings of guilt associated with her loss.  We will use cognitive behavioral therapy to help identify and change anxiety producing thoughts and behavior patterns so  as to improve his ability to better manage anxiety and stress, and manage thoughts and worrisome thinking contributing to anxiety.  We will also refr esh and encourage dialectical behavior therapy distress tolerance and mindfulness skills with the intention of reducing anxiety by 50% over the next 6 months. Progress: 30% Interventions: Cognitive Behavioral Therapy  Diagnosis:PTSD, Bi-Polar D/O  Plan: F/U/ appointments scheduled weekly.  Cecile Coder, [facility]                                                                                                                    Cecile Coder, [facility]               Cecile Coder, [facility]               Cecile Coder, [facility]  Cecile Coder, [facility]               Cecile Coder, [facility]               Cecile Coder, [facility]               Cecile Coder, [facility]  [place] [facility] Counselor/Therapist Progress Note  Patient ID: Eathon Valade, MRN: [id number],    Date: 6/6/ 2025 This session was held via video teletherapy. The patient consented to the video teletherapy and was located in his home during this session. He is aware it is the responsibility of the patient to secure confidentiality on his end of the session. The provider was in a private home office for the duration of this session.   The patient arrived on time for her Caregility session  Time Spent: 39 minutes, 2 PM to 2:39 PM  Treatme nt Type: Individual Therapy Reported Symptoms: anxiety, panic attacks The patient was not feeling well today.  He started feeling really earlier in the week having a fever bad headaches body aches.  He felt a little better yesterday but feels a little bit of a resurgence in headaches and body aches and is running a low-grade fever.  His wife is feeling some of the same symptoms.  For the most part he says he is doing well.  They did i ncrease his memory medication because they were starting to see some increasing decline in short-term memory and he said until recently his long-term memory had been good but he was starting to see a difference in how he used to markers to remember what happened at certain times historically.  He feels the medication has helped and has brightened his mood a little also.  He is still trying to walk every day and fish when he can and has  enjoyed watching the [employer] as a good distraction.  He reports that he feels he is managing mood fairly well.  He has an appointment in 2 weeks.  He does contract for  safety having no thoughts of hurting himself or anyone else. We will continue to work on coping skills for helping with his anxiety and the progression of his Lewy body dementia diagnosis as well as processing his grief. 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 Speech/Language:  Normal Rate Affect: Appropriate Mood: normal Thought process: normal Thought content:  WNL Sensory/Perceptual disturbances:  WNL Orientation: oriented to person, place, and situation Attention: Good Concentration: Good Memory: Impaired short term Fund of knowledge:  Good Insight:  Good Judgment:  Go od Impulse Control: Good  Risk Assessment: Danger to Self: No Self-injurious Behavior: No Danger to Others: No Duty to Warn:no Physical Aggression / Violence:No  Access to Firearms a concern: No  Gang Involvement:No  Treatment plan: Will employ cognitive behavioral therapy as well as grief and loss therapy for relief of anxiety and grief.  Goals of grief therapy or to have a healthier response to the loss of his daughter and  less sadness as evidenced by patient report in therapy notes as well as to help him feel less overwhelmed by her loss.  Goals are to see a 50% reduction in grief symptoms over the next 6 months.  I will encourage the use of the patient telling his grief story about his daughter including encouraging him to bring pictures and memorabilia to help process his feelings including any feelings of guilt associated with her loss.  We will use  cognitive behavioral therapy to help identify and change anxiety producing thoughts and behavior patterns so as to improve his ability to better manage anxiety and stress, and manage thoughts and worrisome thinking contributing to anxiety.  We will also refresh and  encourage dialectical behavior therapy distress tolerance and mindfulness skills with the intention of  reducing anxiety by 50% over the next 6 months. Progress: 30% Interve ntions: Cognitive Behavioral Therapy  Diagnosis:PTSD, Bi-Polar D/O  Plan: F/U/ appointments scheduled weekly.  Cecile Coder, [facility]                                                                                                                   Cecile Coder, [facility]  [place] [facility] Counselor/Therapist Progress Note  Patient ID: Sava Proby, MRN: [id number],    Date: 04/08/2024 This session was held via video  teletherapy. The patient consented to the video teletherapy and was located in his home during this session. He  is aware it is the responsibility of the patient to secure confidentiality on his end of the session. The provider was in a private home office for the duration of this session.   The patient arrived on time for her Caregility session  Time Spent: 58 minutes, 2 PM to 2:58 PM  Treatment Type: Individual Therapy Reported  Symptoms: anxiety, panic attacks A fairly difficult week because it was his daughter's birthday.  He said this year was more difficult than last year but he could not pinpoint why.  They did remember her about going out to a nice restaurant which is something her daughter always wanted to do.  He said they were able to laugh and joke about funny things that his daughter had said or done which is not something they were able to do this  time last year so he saw that as healthy grieving.  He knows that the time change threw things off a little bit for everybody and his wife still is not feeling well and his back has been bothering him.  He was not able to go to his first round of physical therapy this week because his back was bothering him more.  He has been thinking a lot more about his daughter which he feels comfortable with.  He is thankful that it is getting warme r and staying light longer so he can get back to some of the coping skills such as fishing that he has done in the past.   We will continue to work on coping skills for helping with his anxiety and the progression of his Lewy body dementia diagnosis as well as processing his grief. 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 Speech/Language :  Normal Rate Affect: Appropriate Mood: normal Thought process: normal Thought content:  WNL Sensory/Perceptual disturbances:  WNL Orientation: oriented to person, place, and situation Attention: Good Concentration: Good Memory: Impaired short term Fund of knowledge:  Good Insight:   Good Judgment:  Good Impulse Control: Good  Risk Assessment: Danger to Self: No Self-injurious Behavior: No Danger to Others: No Dut y to Warn:no Physical Aggression / Violence:No  Access to Firearms a concern: No  Gang Involvement:No  Treatment plan: Will employ cognitive behavioral therapy as well as grief and loss therapy for relief of anxiety and grief.  Goals of grief therapy or to have a healthier response to the loss of his daughter and less sadness as evidenced by patient report in therapy notes as well as to help him feel less overwhelmed by her loss.  G oals are to see a 50% reduction in grief symptoms over the next 6 months.  I will encourage the use of the patient telling his grief story about his daughter including encouraging him to bring pictures and memorabilia to help process his feelings including any feelings of guilt associated with her loss.  We will use cognitive behavioral therapy to help identify and change anxiety producing thoughts and behavior patterns so as to improve  his ability to better manage anxiety and stress, and manage thoughts and worrisome thinking contributing to anxiety.  We will also refresh and encourage dialectical behavior therapy  distress tolerance and mindfulness skills with the intention of reducing anxiety by 50% over the next 6 months. Progress: 30% Interventions: Cognitive Behavioral Therapy  Diagnosis:PTSD, Bi-Polar D/O  Plan: F/U/ appointments scheduled weekly.  Cecile Coder, [facility]                                                                                                                   Cecile Coder, [facility]               Cecile Coder, [facility]               Cecile Coder, [facility]  [place] [facility] Counselor/Therapist Progress Note  Patient ID: Phi Avans, MRN: [id number],    Date: 04/08/2024 This session was held via video teletherapy. The patient consented to the  video teletherapy and was located in his home during this session. He is aware it is the responsibility of the patient to secure confidentiality on his end of the session. The provider was in a private home office for the duration of this session.   The patient arrived on time for her Caregility session  Time Spent: 1:05 PM until 2 PM, 55 minutes Treatment Type: Individual Therapy Reported Symptoms: anxiety, panic attacks The pa tient said he came in from his walk this morning and saw what he thought was some sort of tic from his wife.  It was not the first time it happened and he wonders if it might be tardive  dyskinesia because of some new medications that she is on.  She has a call into the doctor right now and hopes to hear something back soon.  They have narrowed down to the fact they think most of her discomfort in breathing is coming from the diaphragm a nd they are looking for alternatives for treatment.  The patient continues to do those things which help him cope including walking, fishing when he can, spending time with his son and daughter-in-law and grandson.  He also has a way of continued coping with grief he is writing letters to his daughters as a way of expressing his feelings and says that is very beneficial.  He appears to be grieving in a healthy way but he knows that he  to year anniversary of her death is coming up and there is some grief there but says it is not overwhelming.  We will continue to work on coping skills for helping with his anxiety and the progression of his Lewy body dementia diagnosis as well as processing his grief. 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 Speech/Language:  Normal Rat e Affect: Appropriate Mood: normal Thought process: normal Thought content:  WNL Sensory/Perceptual disturbances:  WNL Orientation: oriented to person, place, and situation Attention: Good Concentration: Good Memory: Impaired short term Fund of knowledge:  Good Insight:  Good Judgment:  Good Impulse Control: Good  Risk Assessment: Danger to Self: No Self-injurious Behavior: No Danger to Others: No Duty to Warn:no  Physical Aggression / Violence:No  Access to Firearms a concern: No  Gang Involvement:No  Treatment plan: Will employ cognitive behavioral therapy as well as grief and loss therapy for relief of anxiety and grief.  Goals of grief therapy or to have a healthier response to the loss of his daughter and less sadness as evidenced by patient report in therapy notes as  well as to help him feel less overwhelmed by her loss.  Goals are to s ee a 50% reduction in grief symptoms over the next 6 months.  I will encourage the use of the patient telling his grief story about his daughter including encouraging him to bring pictures and memorabilia to help process his feelings including any feelings of guilt associated with her loss.  We will use cognitive behavioral therapy to help identify and change anxiety producing thoughts and behavior patterns so as to improve his ability  to better manage anxiety and stress, and manage thoughts and worrisome thinking contributing to anxiety.  We will also refresh and encourage dialectical behavior therapy distress tolerance  and mindfulness skills with the intention of reducing anxiety by 50% over the next 6 months. Progress: 30% Interventions: Cognitive Behavioral Therapy  Diagnosis:PTSD, Bi-Polar D/O  Plan: F/U/ appointments scheduled weekly.  Cecile Coder, [facility]                                                                                                                   Cecile Coder, [facility]  [place] [facility] Counselor/Therapist Progress Note  Patient ID: Cristofher Livecchi, MRN: [id number],    Date: 04/08/2024 This session was held via video teletherapy. The patient consented to the video teletherapy and was located in his home during this session. He is aware it is the resp onsibility of the patient to secure confidentiality on his end of the session. The provider was in a private home office for the duration of this session.   The patient arrived on time for her Caregility session  Time Spent: 59 minutes, 2 PM to 2:59 PM  Treatment Type: Individual Therapy Reported Symptoms: anxiety, panic attacks The patient continues to present with physical pain and both his back and his knee.  He started with  a new physical therapist and told him that there was a lot of arthritis in both knees and or certain things he cannot do but they had him do that anyway and now for the past 3 days he has been in significant knee pain.  He has continued to walk because he knows that is good for him in various ways but says it has been painful.  His wife also was not feeling well but she is going back to the doctor next week to have an ablation and hopes  that will bring her some relief.  For the most part he has been at the last couple weeks reaching back out and hearing from some old friends as well as time with his son and son's  family.  He says that his wife's cousin is coming down for a weekend soon and they enjoy time with her and that he will go to his brother's house sometime in the next few weeks.  He recognizes the need for people that he cares about in helping with his depres sion. We will continue to work on coping skills for helping with his anxiety and the progression of his Lewy body dementia diagnosis as well as processing his grief. 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 Speech/Language:  Normal Rate Affect: Appropriate Mood: normal Thought process: normal Thought content:  WNL Sensory/Perceptual  disturbances:  WNL Orientation: oriented to person, place, and situation Attention: Good Concentration: Good Memory: Impaired short term Fund of knowledge:  Good Insight:  Good Judgment:  Good Impulse Control: Good  Risk Assessment: Danger to Self: No Self-injurious Behavior: No Danger to Others: No Duty to Warn:no Physical Aggression / Violence:No  Access to Firearms a concern: No  Gang Involvement:No  Treatment pl an: Will employ cognitive behavioral therapy as well as grief and loss therapy for relief of anxiety and grief.  Goals of grief therapy or to have a healthier response to the loss of his daughter and less sadness as evidenced by patient report in therapy notes as well as to help him feel less overwhelmed by her loss.  Goals are to see a 50% reduction in grief symptoms over the next 6 months.  I will encourage the use of the patient tell ing his grief story about his daughter including encouraging him to bring pictures and memorabilia to help process his feelings including any feelings of guilt associated with her loss.  We will use cognitive behavioral therapy to help identify and change anxiety producing thoughts and behavior patterns so as to improve his ability to better manage anxiety and  stress, and manage thoughts and worrisome thinking contributing to anxiety.   We will  also refresh and encourage dialectical behavior therapy distress tolerance and mindfulness skills with the intention of reducing anxiety by 50% over the next 6 months. Progress: 30% Interventions: Cognitive Behavioral Therapy  Diagnosis:PTSD, Bi-Polar D/O  Plan: F/U/ appointments scheduled weekly.  Cecile Coder, [facility]                                                                                                                    Cecile Coder, [facility]               Cecile Coder, [facility]               Cecile Coder, [facility]               Cecile Coder, [facility]  [place] [facility] Counselor/Therapist Progress Note  Patient ID: Othello Dickenson, MRN: [id number],    Date: 04/08/2024 This session was held via video teletherapy. The patient consented to the video teletherapy and was located in his home during thi s session. He is aware it is the responsibility of the patient to secure confidentiality on his end of the session. The provider was in a private home office for the duration of this session.   The patient arrived on time for her Caregility session  Time Spent: 57 minutes, 2 PM to 2:57 PM  Treatment Type: Individual Therapy Reported Symptoms: anxiety, panic attacks The patient is still having significant back pain.  He cannot si Stephen Myers anywhere for very long and has to be careful where he sits.  There is still some unsteadiness although he did not report any falls over the past 2 weeks.  His biggest concern now is for his wife.  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  She is meeting with her cardiologist again next week to see what possible treatment is for her.  He still thinks the re may be some issue with her diaphragm but she is meeting with the pulmonologist also.  He is doing what he can to help her.  His daughter's birthday is March 11 so he knows that is a difficult time for both he and his wife next week.  Typically they do something to remember her but because of the wife's current health issues they cannot go to [place] as they had planned so they are talking about what to do to remember and cele brate her birthday.  We will continue to work on coping skills for helping with his anxiety and the progression of his Lewy body dementia diagnosis as well as processing his  grief. 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 Speech/Language:  Normal Rate Affect: Appropriate Mood: normal Thought process: normal Thought content:  WNL Sen sory/Perceptual disturbances:  WNL Orientation: oriented to person, place, and situation Attention: Good Concentration: Good Memory: Impaired short term Fund of knowledge:  Good Insight:  Good Judgment:  Good Impulse Control: Good  Risk Assessment: Danger to Self: No Self-injurious Behavior: No Danger to Others: No Duty to Warn:no Physical Aggression / Violence:No  Access to Firearms a concern: No  Gang Involvement:N o  Treatment plan: Will employ cognitive behavioral therapy as well as grief and loss therapy for relief of anxiety and grief.  Goals of grief therapy or to have a healthier response to the loss of his daughter and less sadness as evidenced by patient report in therapy notes as well as to help him feel less overwhelmed by her loss.  Goals are to see a 50% reduction in grief symptoms over the next 6 months.  I will encourage the use of  the patient telling his grief story about his daughter including encouraging him to bring pictures and memorabilia to help process his feelings including any feelings of guilt associated with her loss.  We will use cognitive behavioral therapy to help identify and change anxiety producing thoughts and behavior patterns so as to improve his ability to better manage anxiety and stress, and manage thoughts and worrisome thinking contributi ng to anxiety.  We will also refresh and encourage dialectical behavior therapy distress tolerance and mindfulness skills  with the intention of reducing anxiety by 50% over the next 6 months. Progress: 30% Interventions: Cognitive Behavioral Therapy  Diagnosis:PTSD, Bi-Polar D/O  Plan: F/U/ appointments scheduled weekly.  Cecile Coder,  [facility]                                                                                                                    Cecile Coder, [facility]  [place] [facility] Counselor/Therapist Progress Note  Patient ID: Constantino Starace, MRN: [id number],    Date: 04/08/2024 This session was held via video teletherapy. The patient consented to the video teletherapy and was located in his home during this session. He is aware it is the responsibility of the patient to secure confidentiality on his end of the session. The provide r was in a private home office for the duration of this session.   The patient arrived on  time for her Caregility session  Time Spent: 58 minutes, 2 PM to 2:58 PM  Treatment Type: Individual Therapy Reported Symptoms: anxiety, panic attacks A fairly difficult week because it was his daughter's birthday.  He said this year was more difficult than last year but he could not pinpoint why.  They did remember her about going out to a  nice restaurant which is something her daughter always wanted to do.  He said they were able to laugh and joke about funny things that his daughter had said or done which is not something they were able to do this time last year so he saw that as healthy grieving.  He knows that the time change threw things off a little bit for everybody and his wife still is not feeling well and his back has been bothering him.  He was not able to go  to his first round of physical therapy this week because his back was bothering him more.  He has been thinking a lot more about his daughter which he feels comfortable with.  He is thankful that it is getting warmer and staying light longer so he can get back to some of the coping skills such as fishing that he has done in the past.   We will continue to work on coping skills for helping with his anxiety and the progression of his L ewy body dementia diagnosis as well as processing his grief. 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 Speech/Language:  Normal Rate Affect: Appropriate Mood: normal Thought process: normal Thought content:  WNL Sensory/Perceptual disturbances:  WNL Orientation: oriented to person, place, and situation Attention: Good Concentration : Good Memory: Impaired short term Fund of knowledge:  Good Insight:  Good Judgment:  Good Impulse Control: Good  Risk Assessment: Danger to Self: No Self-injurious Behavior: No Danger to Others: No Duty to Warn:no Physical Aggression / Violence:No  Access to  Firearms a concern: No  Gang Involvement:No  Treatment plan: Will employ cognitive behavioral therapy as well as grief and loss therapy for relief of anxiety and g rief.  Goals of grief therapy or to have a healthier response to the loss of his daughter and less sadness as evidenced by patient report in therapy notes as well as to help him feel less overwhelmed by her loss.  Goals are to see a 50% reduction in grief symptoms over the next 6 months.  I will encourage the use of the patient telling his grief story about his daughter including encouraging him to bring pictures and memorabilia to help  process his feelings including any feelings of guilt associated with her loss.  We will use cognitive behavioral therapy to help identify and change anxiety producing thoughts and behavior patterns so as to improve his ability to better manage anxiety and stress, and manage thoughts and worrisome thinking contributing to anxiety.  We will also refresh and encourage dialectical behavior therapy distress  tolerance and mindfulness skills  with the intention of reducing anxiety by 50% over the next 6 months. Progress: 30% Interventions: Cognitive Behavioral Therapy  Diagnosis:PTSD, Bi-Polar D/O  Plan: F/U/ appointments scheduled weekly.  Cecile Coder, [facility]                                                                                                                    Cecile Coder, [facility]               Cecile Coder, [facility]               Cecile Coder, [facility]  [place] [facility] Counselor/Therapist Progress Note  Patient ID: Mostyn Varnell, MRN: [id number],    Date: 04/08/2024 This session was held via video teletherapy. The patient consented to the video teletherapy and was located in his home during this session. He is aware it is the responsibility of the patient to secure confidentiality on his end of the session. The provider was in a private home office  for the duration of this session.   The patient arrived on time for her Caregility session  Time Spent: 57 minutes, 2 PM to 2:57 PM  Treatment Type: Individual Therapy Reported Symptoms: anxiety, panic attacks The patient is still having significant back pain.  He cannot sit anywhere for very long and has to be careful where he sits.  There is still some unsteadiness although he did not report any falls over the past 2 weeks.   His biggest concern now is for his wife.  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  She is meeting  with her cardiologist again next week to see what possible treatment is for her.  He still thinks there may be some issue with her diaphragm but she is meeting with the pulmonologist also.  He is doing what he can to help her.  His daughter's birthday is [birth year] so he knows that is a difficult time for both he and his wife next week.  Typically they do something to remember her but because of the wife's current health issues they cannot go to [place] as they had planned so they are talking about what to do to remember and celebrate her birthday.  We will continue to work on coping skills for helping with his anxiety and the progression of his Lewy body dementia diagnosis as well  as processing his grief. 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 Speech/Language:  Normal Rate Affect: Appropriate Mood: normal Thought process: normal Thought content:  WNL Sensory/Perceptual disturbances:  WNL Orientation: oriented to person, place, and situation Attention: Good Concentration: Good Memory: Impaired short term  Fund of knowledge:  Good Insight:  Good Judgment:  Good Impulse Control: Good  Risk Assessment: Danger to Self: No Self-injurious Behavior: No Danger to Others: No Duty to Warn:no Physical Aggression / Violence:No  Access to Firearms a concern: No  Gang Involvement:No  Treatment plan: Will employ cognitive behavioral therapy as well as grief and loss therapy for relief of anxiety and grief.  Goals of grief therapy or to  have a healthier response to the loss of his daughter and less sadness as evidenced by patient report in therapy notes as well as to help him feel less overwhelmed by her loss.  Goals are to see a 50% reduction in grief symptoms over the next 6 months.  I will encourage the use of the patient telling his grief story about his daughter including encouraging him to  bring pictures and memorabilia to help process his feelings including any  feelings of guilt associated with her loss.  We will use cognitive behavioral therapy to help identify and change anxiety producing thoughts and behavior patterns so as to improve his ability to better manage anxiety and stress, and manage thoughts and worrisome thinking contributing to anxiety.  We will also refresh and encourage dialectical behavior therapy distress tolerance and mindfulness skills  with the intention of reducing anxi ety by 50% over the next 6 months. Progress: 30% Interventions: Cognitive Behavioral Therapy  Diagnosis:PTSD, Bi-Polar D/O  Plan: F/U/ appointments scheduled weekly.  Cecile Coder, [facility]                                                                                                                   Cecile Coder , [facility]  [place] [facility] Counselor/Therapist Progress Note  Patient ID: Arwin Bisceglia, MRN: [id number],    Date: 04/08/2024 This session was held via video teletherapy. The patient consented to the video teletherapy and was located in his home during this session. He is aware it is the responsibility of the patient to secure confidentiality on his end of the session. The provider was in a private home office for the duration of this session.   The patient arrived on time for her Caregility session  Time Spent: 57 minutes, 2 PM  to 2:57 PM  Treatment Type: Individual Therapy Reported Symptoms: anxiety, panic attacks That was a situation earlier in the week when the patient's neighbor was banging on his back door and window.  There had been situations where the same neighbor had said things and use profanity directed at his wife feeling that the patient and his wife and let their dog use the bathroom in his yard.  The patient said they are very careful becaus e her dog is old and did not think that happen but did not appreciate how they were handled things.  He said he got angry very quickly and did not handle it the way that he wanted to.  The situation has been resolved but the patient has had very clear boundaries with his neighbor. His wife continues to progress well from her surgery.  Physically the patient is doing fairly well.  He had to see one of his brothers this week.  He is spen ding time with his son and son's family.  He has gotten some retirement/insurance things works out for the most part and is glad to have that settled. We will continue to work on  coping skills for helping with his anxiety and the progression of his Lewy body dementia diagnosis as well as processing his grief. Mental Status Exam: Appearance:  Well Groomed  Behavior: Appropriate Motor: Pt. Reports some instability due to Lewey Body  dementia Speech/Language:  Normal Rate Affect: Appropriate Mood: normal Thought process: normal Thought content:  WNL Sensory/Perceptual disturbances:  WNL Orientation: oriented to person, place, and situation Attention: Good Concentration: Good Memory: Impaired short term Fund of knowledge:  Good Insight:  Good Judgment:  Good Impulse Control: Good  Risk Assessment: Danger to Self: No Self-injurious Behavior: No  Danger to Others: No Duty to Warn:no Physical Aggression / Violence:No  Access to Firearms a concern: No  Gang Involvement:No  Treatment plan: Will employ cognitive behavioral therapy as well as grief and loss therapy for relief of anxiety and grief.  Goals of grief therapy or to have a healthier response to the loss of his daughter and less sadness as evidenced by patient report in therapy notes as well as to help him feel less o verwhelmed by her loss.  Goals are to see a 50% reduction in grief symptoms over the next 6 months.  I will encourage the use of the patient telling his grief story about his daughter including encouraging him to bring pictures and memorabilia to help process his feelings including any feelings of guilt associated with her loss.  We will use cognitive behavioral therapy to help identify and change anxiety producing thoughts and behavior  patterns so as to improve his ability to better manage anxiety and stress, and manage thoughts and worrisome thinking contributing to anxiety.  We will also refresh and encourage dialectical behavior therapy distress tolerance and mindfulness skills with the intention of reducing anxiety by 50% over the  next 6 months. Progress: 30% Interventions: Cognitive  Behavioral Therapy  Diagnosis:PTSD, Bi-Polar D/O  Plan: F/U/ appointments  scheduled weekly.  Cecile Coder, [facility]                                                                                                                   Cecile Coder, [facility]               Cecile Coder, [facility]               Cecile Coder, [facility]               Cecile Coder,  [facility]               Cecile Coder, [facility]               Cecile Coder, [facility]               Cecile Coder, [facility]               Cecile Coder, [facility]               Cecile Coder, [facility]               Cecile Coder, [facility]  [place] [facility] Counselor/Therapist Progress Note  Patient ID: Radek Carnero, MRN: [id number],    Date: 04/01/2024 This session was held via video teletherapy. The patient consented to the video teletherapy and was located in his hom e during this session. He is aware it is the responsibility of the patient to secure confidentiality on his end of the session. The provider was in a private home office for the duration of this session.   The patient arrived on time for her Caregility session  Time Spent: 59 minutes, 2 PM to 2:59 PM  Treatment Type: Individual Therapy Reported Symptoms: anxiety, panic attacks The patient said he jokingly said to his wife that h e had not had any pain issues in the cervical area and then a few days ago he started having pain in his neck and shoulders.  He now sits related to degenerative disk and has some consistent pain somewhere in his spine with that but jokingly says it just depends on Wednesday which part of his body is decides to show up and hurt.  He is doing the best that he can.  He is still walking which he knows is good for him but does not have righ Stephen Myers now the strength to do as much in the morning as he has done in the past so he is denying that to 3 times a day for consistency.  He is still doing some positive things and that he is staying in touch with his brother and other family members.  He is reconnecting with friends from high school and he is which she is enjoying.  One of them has had a similar experience in that she lost her son in the same way the patient lost his daughte r.  He said they did not think and Easter in which they set a place for his daughter at the table and put her picture there.  He initially had reservations about doing that he and his  wife had made everyone feel as if she was really there with him.  He still feels that he is grieving in a healthy way.  He and his wife have always taken a trip around both of their birthdays which are 2 days apart.  That is now close to his daughter died  so they have started taking some of her rashes with him and sprinkling them where they go as a way of remembrance.  They are deciding what that looks like this year in July.  He does contract for safety having no thoughts of hurting himself or anyone else. We will continue to work on coping skills for helping with his anxiety and the progression of his Lewy body dementia diagnosis as well as processing his grief. Mental Status Exam:  Appearance:  Well Groomed  Behavior: Appropriate Motor: Pt. Reports some instability due to Lewey Body dementia Speech/Language:  Normal Rate Affect: Appropriate Mood: normal Thought process: normal Thought content:  WNL Sensory/Perceptual disturbances:  WNL Orientation: oriented to person, place, and situation Attention: Good Concentration: Good Memory: Impaired short term Fund of knowledge:  Good Insight:  Good J udgment:  Good Impulse Control: Good  Risk Assessment: Danger to Self: No Self-injurious Behavior: No Danger to Others: No Duty to Warn:no Physical Aggression / Violence:No  Access to Firearms a concern: No  Gang Involvement:No  Treatment plan: Will employ cognitive behavioral therapy as well as grief and loss therapy for relief of anxiety and grief.  Goals of grief therapy or  to have a healthier response to the loss of his  daughter and less sadness as evidenced by patient report in therapy notes as well as to help him feel less overwhelmed by her loss.  Goals are to see a 50% reduction in grief symptoms over the next 6 months.  I will encourage the use of the patient telling his grief story about his daughter including encouraging him to bring pictures and memorabilia to help process  his feelings including any feelings of guilt associated with her loss.   We will use cognitive behavioral therapy to help identify and change anxiety producing thoughts and behavior patterns so as to improve his ability to better manage anxiety and stress, and manage thoughts and worrisome thinking contributing to anxiety.  We will also refresh and encourage dialectical behavior therapy distress tolerance and mindfulness skills with the intention of reducing anxiety by 50% over the next 6 months. Progress:  30% Interventions: Cognitive Behavioral Therapy  Diagnosis:PTSD, Bi-Polar D/O  Plan: F/U/ appointments scheduled weekly.  Cecile Coder, [facility]                                                                                                                   Cecile Coder, [facility]  [place] [facility] Counselor/Therapist Progress Note  Patient ID: Dorsel Flinn, MRN: [id number],    Date: 04/01/2024 This session was he ld via video teletherapy. The patient consented to the video teletherapy and was located in his home during this session. He is aware it is the responsibility of the patient to secure confidentiality on his end of the session. The provider was in a private home office for the duration of this session.   The patient arrived on time for her Caregility session  Time Spent: 58 minutes, 2 PM to 2:58 PM  Treatment Type: Individual Thera py Reported Symptoms: anxiety, panic attacks A fairly difficult week because it was his daughter's birthday.  He said this year was more difficult than last year but he could not pinpoint why.  They did remember her about going out to a nice restaurant which is something her daughter always wanted to do.  He said they were able to laugh and joke about funny things that his daughter had said or done which is not something they were abl e to do this time last year so he saw that as healthy grieving.  He knows that the time change threw things off a little bit for everybody and his wife still is not feeling well and his back has been bothering him.  He was not able to go to his first round of physical therapy this week because his back was bothering him more.  He has been thinking a lot more about his daughter which he feels comfortable with.  He is thankful that it is  getting warmer and staying light longer so he can get back to some of the coping skills such as fishing that he has done in the past.   We will continue to work on coping skills for  helping with his anxiety and the progression of his Lewy body dementia diagnosis as well as processing his grief. 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 Sp eech/Language:  Normal Rate Affect: Appropriate Mood: normal Thought process: normal Thought content:  WNL Sensory/Perceptual disturbances:  WNL Orientation: oriented to person, place, and situation Attention: Good Concentration: Good Memory: Impaired short term Fund of knowledge:  Good Insight:  Good Judgment:  Good Impulse Control: Good  Risk Assessment: Danger to Self: No Self-injurious Behavior: No Danger to Ot hers: No Duty to Warn:no Physical Aggression / Violence:No  Access to Firearms a concern: No  Gang Involvement:No  Treatment plan: Will employ cognitive behavioral therapy as well as grief and loss therapy for relief of anxiety and grief.  Goals of grief therapy or to have a healthier response to the loss of his daughter and less sadness as evidenced by patient report in therapy notes as well as to help him feel less overwhelmed by  her loss.  Goals are to see a 50% reduction in grief symptoms over the next 6 months.  I will encourage the use of the patient telling his grief story about his daughter including encouraging him to bring pictures and memorabilia to help process his feelings including any feelings of guilt associated with her loss.  We will use cognitive behavioral therapy to help identify and change anxiety producing thoughts and behavior patterns so  as to improve his ability to better manage anxiety and stress, and manage thoughts and worrisome thinking contributing to anxiety.  We will also refresh and encourage dialectical behavior therapy distress  tolerance and mindfulness skills with the intention of reducing anxiety by 50% over the next 6 months. Progress: 30% Interventions: Cognitive Behavioral  Therapy  Diagnosis:PTSD, Bi-Polar D/O  Plan: F/U/ appointments scheduled wee kly.  Cecile Coder, [facility]                                                                                                                   Cecile Coder, [facility]               Cecile Coder, [facility]               Cecile Coder, [facility]  [place] [facility] Counselor/Therapist Progress Note  Patient ID: Rashan Rounsaville, MRN: [id number],    Date: 04/01/2024 This session was held via video teletherapy. The patient con sented to the video teletherapy and was located in his home during this session. He is aware it is the  responsibility of the patient to secure confidentiality on his end of the session. The provider was in a private home office for the duration of this session.   The patient arrived on time for her Caregility session  Time Spent: 1:05 PM until 2 PM, 55 minutes Treatment Type: Individual Therapy Reported Symptoms: anxiety, panic a ttacks The patient said he came in from his walk this morning and saw what he thought was some sort of tic from his wife.  It was not the first time it happened and he wonders if it might be tardive dyskinesia because of some new medications that she is on.  She has a call into the doctor right now and hopes to hear something back soon.  They have narrowed down to the fact they think most of her discomfort in breathing is coming from Stephen Myers he diaphragm and they are looking for alternatives for treatment.  The patient continues to do those things which help him cope including walking, fishing when he can, spending time with his son and daughter-in-law and grandson.  He also has a way of continued coping with grief he is writing letters to his daughters as a way of expressing his feelings and says that is very beneficial.  He appears to be grieving in a healthy way but he  knows that he to year anniversary of her death is coming up and there is some grief there but says it is not overwhelming.  We will continue to work on coping skills for helping with his anxiety and the progression of his Lewy body dementia diagnosis as well as processing his grief. 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 Speech/Languag e:  Normal Rate Affect: Appropriate Mood: normal Thought process: normal Thought content:  WNL Sensory/Perceptual disturbances:  WNL Orientation: oriented to person, place, and situation Attention: Good Concentration: Good Memory: Impaired short term Fund of knowledge:   Good Insight:  Good Judgment:  Good Impulse Control: Good  Risk Assessment: Danger to Self: No Self-injurious Behavior: No Danger to Others: No Du ty to Warn:no Physical Aggression / Violence:No  Access to Firearms a concern: No  Gang Involvement:No  Treatment plan: Will employ cognitive behavioral therapy as well as grief and loss therapy for relief of anxiety and grief.  Goals of grief therapy or to have a healthier response to the loss of his daughter and less sadness as evidenced by patient report in therapy notes as well as to help him feel less overwhelmed by her loss.   Goals are to see a 50% reduction in grief symptoms over the next 6 months.  I will encourage the use of the patient telling his grief story about his daughter including encouraging him to bring pictures and memorabilia to help process his feelings including any feelings of guilt associated with her loss.  We will use cognitive behavioral therapy to help identify and change anxiety producing thoughts and behavior patterns so as to improv e his ability to better manage anxiety and stress, and manage thoughts and worrisome thinking contributing to anxiety.  We will also refresh and encourage dialectical behavior therapy distress tolerance  and mindfulness skills with the intention of reducing anxiety by 50% over the next 6 months. Progress: 30% Interventions: Cognitive Behavioral Therapy  Diagnosis:PTSD, Bi-Polar D/O  Plan: F/U/ appointments scheduled weekly.  Crai g Kelly Patient, [facility]                                                                                                                   Cecile Coder, [facility]  [place] [facility] Counselor/Therapist Progress Note  Patient ID: Eros Montour, MRN: [id number],    Date: 04/01/2024 This session was held via video teletherapy. The patient consented to the video teletherapy and was located in his home during this session. He is aware  it is the responsibility of the patient to secure confidentiality on his end of the session. The provider was in a private home office for the duration of this session.   The patient arrived on time for her Caregility session  Time Spent: 59 minutes, 2 PM to 2:59 PM  Treatment Type: Individual Therapy Reported Symptoms: anxiety, panic attacks The patient continues to present with physical pain and both his back and his knee.  H e started with a new physical therapist and told him that there was a lot of arthritis in both knees and or certain things he cannot do but they had him do that anyway and now for the  past 3 days he has been in significant knee pain.  He has continued to walk because he knows that is good for him in various ways but says it has been painful.  His wife also was not feeling well but she is going back to the doctor next week to have an abl ation and hopes that will bring her some relief.  For the most part he has been at the last couple weeks reaching back out and hearing from some old friends as well as time with his son and son's family.  He says that his wife's cousin is coming down for a weekend soon and they enjoy time with her and that he will go to his brother's house sometime in the next few weeks.  He recognizes the need for people that he cares about in helping  with his depression. We will continue to work on coping skills for helping with his anxiety and the progression of his Lewy body dementia diagnosis as well as processing his grief. 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 Speech/Language:  Normal Rate Affect: Appropriate Mood: normal Thought process: normal Thought content:  WNL Sens ory/Perceptual disturbances:  WNL Orientation: oriented to person, place, and situation Attention: Good Concentration: Good Memory: Impaired short term Fund of knowledge:  Good Insight:  Good Judgment:  Good Impulse Control: Good  Risk Assessment: Danger to Self: No Self-injurious Behavior: No Danger to Others: No Duty to Warn:no Physical Aggression / Violence:No  Access to Firearms a concern: No  Gang Involvement:No   Treatment plan: Will employ cognitive behavioral therapy as well as grief and loss therapy for relief of anxiety and grief.  Goals of grief therapy or to have a healthier response to the loss of his daughter and less sadness as evidenced by patient report in therapy notes as well as to help him feel less overwhelmed by her loss.  Goals are to see a 50% reduction  in grief symptoms over the next 6 months.  I will encourage the use of Stephen Myers he patient telling his grief story about his daughter including encouraging him to bring pictures and memorabilia to help process his feelings including any feelings of guilt associated with her loss.  We will use cognitive behavioral therapy to help identify and change anxiety producing thoughts and behavior patterns so as to improve his ability to better manage anxiety and stress, and manage thoughts and worrisome thinking contributin g to anxiety.  We will  also refresh and encourage dialectical behavior therapy distress tolerance and mindfulness skills with the intention of reducing anxiety by 50% over the next 6 months. Progress: 30% Interventions: Cognitive Behavioral Therapy  Diagnosis:PTSD, Bi-Polar D/O  Plan: F/U/ appointments scheduled weekly.  Cecile Coder, [facility]                                                                                                                    Cecile Coder, [facility]               Cecile Coder, [facility]               Cecile Coder, [facility]               Cecile Coder, [facility]  [place] [facility] Counselor/Therapist Progress Note  Patient ID: Bram Hottel, MRN: [id number],    Date: 04/01/2024 This session was held via video teletherapy. The patient consented to the video teletherapy and was located in his  home during this session. He is aware it is the responsibility of the patient to secure confidentiality on his end of the session. The provider was in a private home office for the duration of this session.   The patient arrived on time for her Caregility session  Time Spent: 57 minutes, 2 PM to 2:57 PM  Treatment Type: Individual Therapy Reported Symptoms: anxiety, panic attacks The patient is still having significant back pai n.  He cannot sit anywhere for very long and has to be careful where he sits.  There is still some unsteadiness although he did not report any falls over the past 2 weeks.  His biggest concern now is for his wife.  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  She is meeting with her cardiologist again next week to see what possible treatment is for her.  He  still thinks there may be some issue with her diaphragm but she is meeting with the pulmonologist also.  He is doing what he can to help her.  His daughter's birthday is March 11 so  he knows that is a difficult time for both he and his wife next week.  Typically they do something to remember her but because of the wife's current health issues they cannot go to [place] as they had planned so they are talking about what to do to r emember and celebrate her birthday.  We will continue to work on coping skills for helping with his anxiety and the progression of his Lewy body dementia diagnosis as well as processing his grief. 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 Speech/Language:  Normal Rate Affect: Appropriate Mood: normal Thought process: normal Thought con tent:  WNL Sensory/Perceptual disturbances:  WNL Orientation: oriented to person, place, and situation Attention: Good Concentration: Good Memory: Impaired short term Fund of knowledge:  Good Insight:  Good Judgment:  Good Impulse Control: Good  Risk Assessment: Danger to Self: No Self-injurious Behavior: No Danger to Others: No Duty to Warn:no Physical Aggression / Violence:No  Access to Firearms a concern: No  Ga ng Involvement:No  Treatment plan: Will employ cognitive behavioral therapy as well as grief and loss therapy for relief of anxiety and grief.  Goals of grief therapy or to have a healthier response to the loss of his daughter and less sadness as evidenced by patient report in therapy notes as well as to help him feel less overwhelmed by her loss.  Goals are to see a 50% reduction in grief symptoms over the next 6 months.  I will encou rage the use of the patient telling his grief story about his daughter including encouraging him to bring pictures and memorabilia to help process his feelings including any feelings of guilt associated with her loss.  We will use cognitive behavioral therapy to help identify and change anxiety producing thoughts and behavior patterns so as to improve his ability to  better manage anxiety and stress, and manage thoughts and worrisome thi nking contributing to anxiety.  We will also refresh and encourage dialectical behavior therapy distress tolerance and mindfulness  skills with the intention of reducing anxiety by 50% over the next 6 months. Progress: 30% Interventions: Cognitive Behavioral Therapy  Diagnosis:PTSD, Bi-Polar D/O  Plan: F/U/ appointments scheduled weekly.  Cecile Coder, [facility]                                                                                                                    Cecile Coder, [facility]  [facility] Counselor/Therapist  Progress Note  Patient ID: Coron Rossano, MRN: [id number],    Date: 04/01/2024 This session was held via video teletherapy. The patient consented to the video teletherapy and was located in his home during this session. He is aware it is the responsibility of the patient to secure confidentiality on his end of the ses sion. The provider was in a private home office for the duration of this session.   The patient arrived on time for her Caregility session  Time Spent: 58 minutes, 2 PM to 2:58 PM  Treatment Type: Individual Therapy Reported Symptoms: anxiety, panic attacks A fairly difficult week because it was his daughter's birthday.  He said this year was more difficult than last year but he could not pinpoint why.  They did remember her abo ut going out to a nice restaurant which is something her daughter always wanted to do.  He said they were able to laugh and joke about funny things that his daughter had said or done which is not something they were able to do this time last year so he saw that as healthy grieving.  He knows that the time change threw things off a little bit for everybody and his wife still is not feeling well and his back has been bothering him.  He wa s not able to go to his first round of physical therapy this week because his back was bothering him more.  He has been thinking a lot more about his daughter which he feels comfortable with.  He is thankful that it is getting warmer and staying light longer so he can get back to some of the coping skills such as fishing that he has done in the past.   We will continue to work on coping skills for helping with his anxiety and the pro gression of his Lewy body dementia diagnosis as well as processing his grief. 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 Speech/Language:  Normal Rate Affect: Appropriate Mood: normal Thought  process: normal Thought content:  WNL Sensory/Perceptual disturbances:  WNL Orientation: oriented to person, place, and situation Attention: Go od Concentration: Good Memory: Impaired short term Fund of knowledge:  Good Insight:  Good Judgment:  Good Impulse Control: Good  Risk Assessment: Danger to Self: No Self-injurious Behavior: No Danger to Others: No Duty to Warn:no Physical Aggression / Violence:No  Access to Firearms a concern: No  Gang Involvement:No  Treatment plan: Will employ cognitive behavioral therapy as well as grief and loss therapy for relief  of anxiety and grief.  Goals of grief therapy or to have a healthier response to the loss of his daughter and less sadness as evidenced by patient report in therapy notes as well as to help him feel less overwhelmed by her loss.  Goals are to see a 50% reduction in grief symptoms over the next 6 months.  I will encourage the use of the patient telling his grief story about his daughter including encouraging him to bring pictures and me morabilia to help process his feelings including any feelings of guilt associated with her loss.  We will use cognitive behavioral therapy to help identify and change anxiety producing thoughts and behavior patterns so as to improve his ability to better manage anxiety and stress, and manage thoughts and worrisome thinking contributing to anxiety.  We will also refresh and encourage dialectical behavior therapy distress  tolerance and mi ndfulness skills with the intention of reducing anxiety by 50% over the next 6 months. Progress: 30% Interventions: Cognitive Behavioral Therapy  Diagnosis:PTSD, Bi-Polar D/O  Plan: F/U/ appointments scheduled weekly.  Cecile Coder, [facility]                                                                                                                     Cecile Coder, [facility]               Cecile Coder, [facility]               Cecile Coder, [facility]  [place] [facility] Counselor/Therapist Progress Note  Patient ID: Gable Odonohue, MRN: [id number],    Date: 04/01/2024 This session was held via video teletherapy. The patient consented to the video teletherapy and was located in his home during this session. He is aware it is the responsibility of the patient to secure confidentiality on his end of the session. The provider was in a p rivate home office for the duration of this session.   The patient arrived on time for her Caregility session  Time Spent: 57 minutes, 2 PM to 2:57 PM  Treatment Type: Individual  Therapy Reported Symptoms: anxiety, panic attacks The patient is still having significant back pain.  He cannot sit anywhere for very long and has to be careful where he sits.  There is still some unsteadiness although he did not report any falls over [place] he past 2 weeks.  His biggest concern now is for his wife.  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  She is meeting with her cardiologist again next week to see what possible treatment is for her.  He still thinks there may be some issue with her diaphragm but she is meeting with the pulmonologist also.  He is doing what he can to help her.  His daught er's birthday is March 11 so he knows that is a difficult time for both he and his wife next week.  Typically they do something to remember her but because of the wife's current health issues they cannot go to [place] as they had planned so they are talking about what to do to remember and celebrate her birthday.  We will continue to work on coping skills for helping with his anxiety and the progression of his Lewy body dementia  diagnosis as well as processing his grief. 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 Speech/Language:  Normal Rate Affect: Appropriate Mood: normal Thought process: normal Thought content:  WNL Sensory/Perceptual disturbances:  WNL Orientation: oriented to person, place, and situation Attention: Good Concentration: Good Memory: I mpaired short term Fund of knowledge:  Good Insight:  Good Judgment:  Good Impulse Control: Good  Risk Assessment: Danger to Self: No Self-injurious Behavior: No Danger to Others: No Duty to Warn:no Physical Aggression / Violence:No  Access to Firearms a concern: No  Gang Involvement:No  Treatment plan: Will employ cognitive behavioral therapy as well  as grief and loss therapy for relief of anxiety and grief.  Goals of g rief therapy or to have a healthier response to the loss of his daughter and less sadness as evidenced by patient report in therapy notes as well as to help him feel less overwhelmed by her loss.  Goals are to see a 50% reduction in grief symptoms over the next 6 months.  I will encourage the use of the patient telling his grief story about his daughter including encouraging him to bring pictures and memorabilia to help process his feel ings including any feelings of guilt associated with her loss.  We will use cognitive behavioral therapy to help identify and change anxiety producing thoughts and behavior patterns so as to improve his ability to better manage anxiety and stress, and manage thoughts and worrisome thinking contributing to anxiety.  We will also refresh and encourage dialectical behavior therapy distress tolerance and mindfulness skills  with the intentio n of reducing anxiety by 50% over the next 6 months. Progress: 30% Interventions: Cognitive Behavioral Therapy  Diagnosis:PTSD, Bi-Polar D/O  Plan: F/U/ appointments scheduled weekly.  Cecile Coder, [facility]                                                                                                                    Cecile Coder, [facility]  [place] [facility] Counselor/Therapist Progress Note  Patient ID: Muriel Wilber, MRN: [id number],    Date: 04/01/2024 This session was held via video teletherapy. The patient consented to the video teletherapy and was located in his home during this session. He is aware it is the responsibility of the patient to secure confidentiality on his end of the session. The provider was in a private home office for the duration of this session.   The patient arrived on time for her Caregility session  Time Spent : 57 minutes, 2 PM to 2:57 PM  Treatment Type: Individual Therapy Reported Symptoms: anxiety, panic attacks  We will continue to work on coping skills for helping with his anxiety and the progression of his Lewy body dementia diagnosis as well as processing his grief. 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 Speech/Language:  Normal Rat e Affect: Appropriate Mood: normal Thought process: normal Thought content:  WNL Sensory/Perceptual disturbances:  WNL Orientation: oriented to person, place, and  situation Attention: Good Concentration: Good Memory: Impaired short term Fund of knowledge:  Good Insight:  Good Judgment:  Good Impulse Control: Good  Risk Assessment: Danger to Self: No Self-injurious Behavior: No Danger to Others: No Duty to Warn:no  Physical Aggression / Violence:No  Access to Firearms a concern: No  Gang Involvement:No  Treatment plan: Will employ cognitive behavioral therapy as well as grief and loss therapy for relief of anxiety and grief.  Goals of grief therapy or to have a healthier response to the loss of his daughter and less sadness as evidenced by patient report in therapy notes as well as to help him feel less overwhelmed by her loss.  Goals are to s ee a 50% reduction in grief symptoms over the next 6 months.  I will encourage the use of the patient telling his grief story about his daughter including encouraging him to bring pictures and memorabilia to help process his feelings including any feelings of guilt associated with her loss.  We will use cognitive behavioral therapy to help identify and change anxiety producing thoughts and behavior patterns so as to improve his ability  to better manage anxiety and stress, and manage thoughts and worrisome thinking contributing to anxiety.  We will also refresh and encourage dialectical behavior therapy distress tolerance and mindfulness skills with the intention of reducing anxiety by 50% over the next 6 months. Progress: 30% Interventions: Cognitive Behavioral Therapy  Diagnosis:PTSD, Bi-Polar D/O  Plan: F/U/ appointments scheduled weekly.  Cecile Coder, [facility]                                                                                                                   Cecile Coder, [facility]               Cecile Coder, [facility]               Cecile Coder,  [facility]  Cecile Coder, [facility]               [place] ig Kelly Patient, [facility]               Cecile Coder, [facility]               Cecile Coder, [facility]  [facility] Counselor/Therapist Progress Note  Patient ID: Manish Ruggiero, MRN: [id number],    Date: 04/01/2024 This session was held via video teletherapy. The patient consented to the video teletherapy and was located in his home during this session. He is aware it is the responsibility of the patient to secure confidentiality on his end of the session. The provider was in a private home office for the duration of th is session.   The patient arrived on time for her Caregility session  Time Spent: 39  minutes, 2 PM to 2:39 PM  Treatment Type: Individual Therapy Reported Symptoms: anxiety, panic attacks The patient was not feeling well today.  He started feeling really earlier in the week having a fever bad headaches body aches.  He felt a little better yesterday but feels a little bit of a resurgence in headaches and body aches and is running  a low-grade fever.  His wife is feeling some of the same symptoms.  For the most part he says he is doing well.  They did increase his memory medication because they were starting to see some increasing decline in short-term memory and he said until recently his long-term memory had been good but he was starting to see a difference in how he used to markers to remember what happened at certain times historically.  He feels the [facility] on has helped and has brightened his mood a little also.  He is still trying to walk every day and fish when he can and has enjoyed watching the [employer] as a good distraction.  He reports that he feels he is managing mood fairly well.  He has an appointment in 2 weeks.  He does contract for safety having no thoughts of hurting himself or anyone else. We will continue to work on coping skills for helping with his anxie ty and the progression of his Lewy body dementia diagnosis as well as processing his grief. 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 Speech/Language:  Normal Rate Affect: Appropriate Mood: normal Thought process: normal Thought content:  WNL Sensory/Perceptual disturbances:  WNL Orientation: oriented to person, place, and situation  Attention: Good Concentration: Good Memory: Impaired short term Fund of knowledge:  Good Insight:  Good Judgment:  Good Impulse Control: Good  Risk Assessment: Danger to Self: No Self-injurious Behavior: No Danger to Others: No Duty to Warn:no Physical  Aggression / Violence:No  Access to Firearms a concern: No  Gang Involvement:No  Treatment plan: Will employ cognitive behavioral therapy as well as grief and loss ther apy for relief of anxiety and grief.  Goals of grief therapy or to have a healthier response to the loss of his daughter and less sadness as evidenced by patient report in therapy notes as well as to help him feel less overwhelmed by her loss.  Goals are to see a 50% reduction in grief symptoms over the next 6 months.  I will encourage the use of the patient telling his grief story about his daughter including encouraging him to bring p ictures and memorabilia to help process his feelings including any feelings of guilt associated with her loss.  We will use cognitive behavioral therapy to help identify and change anxiety producing thoughts and behavior patterns so as to improve his ability to better manage anxiety and stress, and manage thoughts and worrisome thinking contributing to anxiety.  We will also refresh  and encourage dialectical behavior therapy distress to lerance and mindfulness skills with the intention of reducing anxiety by 50% over the next 6 months. Progress: 30% Interventions: Cognitive Behavioral Therapy  Diagnosis:PTSD, Bi-Polar D/O  Plan: F/U/ appointments scheduled weekly.  Cecile Coder, [facility]                                                                                                                    Cecile Coder, [facility]  [place] [facility] Counselor/The rapist Progress Note  Patient ID: Mckoy Bhakta, MRN: [id number],    Date: 04/01/2024 This session was held via video teletherapy. The patient consented to the video teletherapy and was located in his home during this session. He is aware it is the responsibility of the patient to secure confidentiality on his end of the session. The provider was in a private home office for the duration of this session.   The patient arrived  on time for her Caregility session  Time Spent: 58 minutes, 2 PM to 2:58 PM  Treatment Type: Individual Therapy Reported Symptoms: anxiety, panic attacks A fairly difficult week because it was his daughter's birthday.  He said this year was more difficult than last year but he could not pinpoint why.  They did remember her about going out to a nice restaurant which is something her daughter always wanted to do.  He said they were a ble to laugh and joke about funny things that his daughter had said or done which is not something they were able to do this time last year so he saw that as healthy grieving.  He knows  that the time change threw things off a little bit for everybody and his wife still is not feeling well and his back has been bothering him.  He was not able to go to his first round of physical therapy this week because his back was bothering him more.   He has been thinking a lot more about his daughter which he feels comfortable with.  He is thankful that it is getting warmer and staying light longer so he can get back to some of the coping skills such as fishing that he has done in the past.   We will continue to work on coping skills for helping with his anxiety and the progression of his Lewy body dementia diagnosis as well as processing his grief. Mental Status Exam: Appeara nce:  Well Groomed  Behavior: Appropriate Motor: Pt. Reports some instability due to Lewey Body dementia Speech/Language:  Normal Rate Affect: Appropriate Mood: normal Thought process: normal Thought content:  WNL Sensory/Perceptual disturbances:  WNL Orientation: oriented to person, place, and situation Attention: Good Concentration: Good Memory: Impaired short term Fund of knowledge:  Good Insight:  Good Judgment:   Good Impulse Control: Good  Risk Assessment: Danger to Self: No Self-injurious Behavior: No Danger to Others: No Duty to Warn:no Physical Aggression / Violence:No  Access to Firearms a concern: No  Gang Involvement:No  Treatment plan: Will employ cognitive behavioral therapy as well as grief and loss therapy for relief of anxiety and grief.  Goals of grief therapy or to have a healthier response to the loss of his daughter  and less sadness as evidenced by patient report in therapy notes as well as to help him feel less overwhelmed by her loss.  Goals are to see a 50% reduction in grief symptoms over the next 6 months.  I will encourage the use of the patient telling his grief story about his daughter including encouraging him to bring pictures and memorabilia to help process his feelings  including any feelings of guilt associated with her loss.  We will u se cognitive behavioral therapy to help identify and change anxiety producing thoughts and behavior patterns so as to improve his ability to better manage anxiety and stress, and manage thoughts and worrisome thinking contributing to anxiety.  We will also refresh and encourage dialectical behavior therapy  distress tolerance and mindfulness skills with the intention of reducing anxiety by 50% over the next 6 months. Progress: 30% Inte rventions: Cognitive Behavioral Therapy  Diagnosis:PTSD, Bi-Polar D/O  Plan: F/U/ appointments scheduled weekly.  Cecile Coder, [facility]                                                                                                                   Cecile Coder, [facility]               Cecile Coder, [facility]               Cecile Coder, [facility]   Behavioral Health Counselor/Therapist Progress Note  Patient ID: Ruthine Cowper am Mosie Angus, MRN: [id number],    Date: 04/01/2024 This session was held via video teletherapy. The patient consented to the video teletherapy and was located in his home during this session. He is aware it is the responsibility of the patient to secure confidentiality on his end of the session. The provider was in a private home office for the duration of this session.   The patient arrived on time for her Caregility session  [name] e Spent: 1:05 PM until 2 PM, 55 minutes Treatment Type: Individual Therapy Reported Symptoms: anxiety, panic attacks The patient said he came in from his walk this morning and saw what he thought was some sort of tic from his wife.  It was not the first time it happened and he wonders if it might be tardive dyskinesia because of some new medications that she is on.  She has a call into the doctor right now and hopes to hear something  back soon.  They have narrowed down to the fact they think most of her discomfort in breathing is coming from the diaphragm and they are looking for alternatives for treatment.  The patient continues to do those things which help him cope including walking, fishing when he can, spending time with his son and daughter-in-law and grandson.  He also has a way of continued coping with grief he is writing letters to his daughters as a way  of expressing his feelings and says that is very beneficial.  He appears to be grieving in a healthy way but he knows that he to year anniversary of her death is coming up and there is  some grief there but says it is not overwhelming.  We will continue to work on coping skills for helping with his anxiety and the progression of his Lewy body dementia diagnosis as well as processing his grief. Mental Status Exam: Appearance:  Well G roomed  Behavior: Appropriate Motor: Pt. Reports some instability due to Lewey Body dementia Speech/Language:  Normal Rate Affect: Appropriate Mood: normal Thought process: normal Thought content:  WNL Sensory/Perceptual disturbances:  WNL Orientation: oriented to person, place, and situation Attention: Good Concentration: Good Memory: Impaired short term Fund of knowledge:  Good Insight:  Good Judgment:  Good Impul se Control: Good  Risk Assessment: Danger to Self: No Self-injurious Behavior: No Danger to Others: No Duty to Warn:no Physical Aggression / Violence:No  Access to Firearms a concern: No  Gang Involvement:No  Treatment plan: Will employ cognitive behavioral therapy as well as grief and loss therapy for relief of anxiety and grief.  Goals of grief therapy or to have a healthier response to the loss of his daughter and less sad ness as evidenced by patient report in therapy notes as well as to help him feel less overwhelmed by her loss.  Goals are to see a 50% reduction in grief symptoms over the next 6 months.  I will encourage the use of the patient telling his grief story about his daughter including encouraging him to bring pictures and memorabilia to help process his feelings including any feelings of guilt associated with her loss.  We will use cognitive  behavioral therapy to help identify and change anxiety producing thoughts and behavior patterns so as to improve his ability to better manage anxiety and stress, and manage thoughts and worrisome thinking contributing to anxiety.  We will also refresh and encourage dialectical behavior therapy distress tolerance and  mindfulness skills with the intention of reducing  anxiety by 50% over the next 6 months. Progress: 30% Interventions: C ognitive Behavioral Therapy  Diagnosis:PTSD, Bi-Polar D/O  Plan: F/U/ appointments scheduled weekly.  Cecile Coder, [facility]                                                                                                                   Cecile Coder, [facility]  [facility] Counselor/Therapist Progress Note  Patient ID: Tayton Decaire, MRN: [id number],    Date: 04/01/2024 This session was held via video telether apy. The patient consented to the video teletherapy and was located in his home during this session. He is aware  it is the responsibility of the patient to secure confidentiality on his end of the session. The provider was in a private home office for the duration of this session.   The patient arrived on time for her Caregility session  Time Spent: 59 minutes, 2 PM to 2:59 PM  Treatment Type: Individual Therapy Reported Symptoms : anxiety, panic attacks The patient continues to present with physical pain and both his back and his knee.  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  He has continued to walk because he knows that is good for him in various ways but says it ha s been painful.  His wife also was not feeling well but she is going back to the doctor next week to have an ablation and hopes that will bring her some relief.  For the most part he has been at the last couple weeks reaching back out and hearing from some old friends as well as time with his son and son's family.  He says that his wife's cousin is coming down for a weekend soon and they enjoy time with her and that he will go to his br other's house sometime in the next few weeks.  He recognizes the need for people that he cares about in helping with his depression. We will continue to work on coping skills for helping with his anxiety and the progression of his Lewy body dementia diagnosis as well as processing his grief. 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 Speech/ Language:  Normal Rate Affect: Appropriate Mood: normal Thought process: normal Thought content:  WNL Sensory/Perceptual disturbances:  WNL Orientation: oriented to person, place, and situation Attention: Good Concentration: Good Memory: Impaired short term Fund of knowledge:   Good Insight:  Good Judgment:  Good Impulse Control: Good  Risk Assessment: Danger to Self: No Self-injurious Behavior: No Danger to Others:  No Duty to Warn:no Physical Aggression / Violence:No  Access to Firearms a concern: No  Gang Involvement:No  Treatment plan: Will employ cognitive behavioral therapy as well as grief and loss therapy for relief of anxiety and grief.  Goals of grief therapy or to have a healthier response to the loss of his daughter and less sadness as evidenced by patient report in therapy notes as well as to help him feel less overwhelmed by her  loss.  Goals are to see a 50% reduction in grief symptoms over the next 6 months.  I will encourage the use of the patient telling his grief story about his daughter including encouraging him to bring pictures and memorabilia to help process his feelings including any feelings of guilt associated with her loss.  We will use cognitive behavioral therapy to help identify and change anxiety producing thoughts and behavior patterns so as to  improve his ability to better manage anxiety and stress, and manage thoughts and worrisome thinking contributing to anxiety.  We will  also refresh and encourage dialectical behavior therapy distress tolerance and mindfulness skills with the intention of reducing anxiety by 50% over the next 6 months. Progress: 30% Interventions: Cognitive Behavioral Therapy  Diagnosis:PTSD, Bi-Polar D/O  Plan: F/U/ appointments scheduled weekly.   Cecile Coder, [facility]                                                                                                                   Cecile Coder, [facility]               Cecile Coder, [facility]               Cecile Coder, [facility]               Cecile Coder, [facility]  [facility] Counselor/Therapist Progress Note  Patient ID: Garnett Nunziata, MRN: [id number],    Date: 04/01/2024 Ole Berkeley is session was held via video teletherapy. The patient consented to the video teletherapy and was located in his home during this session. He is aware it is the responsibility of the patient to secure confidentiality on his end of the session. The provider was in a private home office for the duration of this session.   The patient arrived on time for her Caregility session  Time Spent: 57 minutes, 2 PM to 2:57 PM  Treatment Type:  Individual Therapy Reported Symptoms: anxiety, panic attacks The patient is still having significant back pain.  He cannot sit anywhere for very long and has to be careful where he  sits.  There is still some unsteadiness although he did not report any falls over the past 2 weeks.  His biggest concern now is for his wife.  She has been having some difficulty breathing and they did a test so they found a small hole on the back side of  her heart.  She is meeting with her cardiologist again next week to see what possible treatment is for her.  He still thinks there may be some issue with her diaphragm but she is meeting with the pulmonologist also.  He is doing what he can to help her.  His daughter's birthday is March 11 so he knows that is a difficult time for both he and his wife next week.  Typically they do something to remember her but because of the wife's cur rent health issues they cannot go to [place] as they had planned so they are talking about what to do to remember and celebrate her birthday.  We will continue to work on coping skills for helping with his anxiety and the progression of his Lewy body dementia diagnosis as well as processing his grief. Mental Status Exam: Appearance:  Well Groomed  Behavior: Appropriate Motor: Pt. Reports some instability due to Lewey Body  dementia Speech/Language:  Normal Rate Affect: Appropriate Mood: normal Thought process: normal Thought content:  WNL Sensory/Perceptual disturbances:  WNL Orientation: oriented to person, place, and situation Attention: Good Concentration: Good Memory: Impaired short term Fund of knowledge:  Good Insight:  Good Judgment:  Good Impulse Control: Good  Risk Assessment: Danger to Self: No Self-injurious Behavior: No  Danger to Others: No Duty to Warn:no Physical Aggression / Violence:No  Access to Firearms a concern: No  Gang Involvement:No  Treatment plan: Will employ cognitive behavioral therapy as well as grief and loss therapy for relief of anxiety and grief.  Goals of grief therapy or to have a healthier response to the loss of his daughter and less sadness as evidenced  by patient report in therapy notes as well as to help him feel less ov erwhelmed by her loss.  Goals are to see a 50% reduction in grief symptoms over the next 6 months.  I will encourage the use of the patient telling his grief story about his daughter including encouraging him to bring pictures and memorabilia to help process his feelings including any feelings of guilt associated with her loss.  We will use cognitive behavioral therapy to help identify and change anxiety producing thoughts and behavior  patterns so as to improve his ability to better manage anxiety and stress, and manage thoughts and worrisome thinking contributing to anxiety.  We will also refresh and encourage dialectical behavior therapy distress tolerance and mindfulness  skills with the intention of reducing anxiety by 50% over the next 6 months. Progress: 30% Interventions: Cognitive Behavioral Therapy  Diagnosis:PTSD, Bi-Polar D/O  Plan: F/U/ appointments s cheduled weekly.  Cecile Coder, [facility]                                                                                                                   Cecile Coder, [facility]  [facility] Counselor/Therapist Progress Note  Patient ID: Seferino Oscar, MRN: [id number],    Date: 04/01/2024 This session was held via video teletherapy. The patient consented to the video teletherapy and was located in his home during Stephen Myers his session. He is aware it is the responsibility of the patient to secure confidentiality on his end of the session. The provider was in a private home office for the duration of this session.   The patient arrived on time for her Caregility session  Time Spent: 58 minutes, 2 PM to 2:58 PM  Treatment Type: Individual Therapy Reported Symptoms: anxiety, panic attacks A fairly difficult week because it was his daughter's birthday .  He said this year was more difficult than last year but he could not pinpoint why.  They did remember her about going out to a nice restaurant which is something her daughter always wanted to do.  He said they were able to laugh and joke about funny things that his daughter had said or done which is not something they were able to do this time last year so he saw that as healthy grieving.  He knows that the time change threw things o ff a little bit for everybody and his wife still is not feeling well and his back has been bothering him.  He was not able to go to his first round of physical therapy this week because  his back was bothering him more.  He has been thinking a lot more about his daughter which he feels comfortable with.  He is thankful that it is getting warmer and staying light longer so he can get back to some of the coping skills such as fishing that  he has done in the past.   We will continue to work on coping skills for helping with his anxiety and the progression of his Lewy body dementia diagnosis as well as processing his grief. 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 Speech/Language:  Normal Rate Affect: Appropriate Mood: normal Thought process: normal Thought content:  W NL Sensory/Perceptual disturbances:  WNL Orientation: oriented to person, place, and situation Attention: Good Concentration: Good Memory: Impaired short term Fund of knowledge:  Good Insight:  Good Judgment:  Good Impulse Control: Good  Risk Assessment: Danger to Self: No Self-injurious Behavior: No Danger to Others: No Duty to Warn:no Physical Aggression / Violence:No  Access to Firearms a concern: No  Lesia Raring ement:No  Treatment plan: Will employ cognitive behavioral therapy as well as grief and loss therapy for relief of anxiety and grief.  Goals of grief therapy or to have a healthier response to the loss of his daughter and less sadness as evidenced by patient report in therapy notes as well as to help him feel less overwhelmed by her loss.  Goals are to see a 50% reduction in grief symptoms over the next 6 months.  I will encourage the  use of the patient telling his grief story about his daughter including encouraging him to bring pictures and memorabilia to help process his feelings including any feelings of guilt associated with her loss.  We will use cognitive behavioral therapy to help identify and change anxiety producing thoughts and behavior patterns so as to improve his ability to better  manage anxiety and stress, and manage thoughts and worrisome thinking con tributing to anxiety.  We will also refresh and encourage dialectical behavior therapy  distress tolerance and mindfulness skills with the intention of reducing anxiety by 50% over the next 6 months. Progress: 30% Interventions: Cognitive Behavioral Therapy  Diagnosis:PTSD, Bi-Polar D/O  Plan: F/U/ appointments scheduled weekly.  Cecile Coder, [facility]                                                                                                                    Cecile Coder, [facility]               Cecile Coder, [facility]               Cecile Coder, [facility]  [facility] Counselor/Therapist Progress Note  Patient ID: Jaeden Messer, MRN: [id number],    Date: 04/01/2024 This session was held via video teletherapy. The patient consented to the video teletherapy and was located in his home during this session. He is aware it i s the responsibility of the patient to secure confidentiality on his end of the session. The provider was in a private home office for the duration of this session.   The patient arrived on time for her Caregility session  Time Spent: 57 minutes, 2 PM to 2:57 PM  Treatment Type: Individual Therapy Reported Symptoms: anxiety, panic attacks The patient is still having significant back pain.  He cannot sit anywhere for very long an d has to be careful where he sits.  There is still some unsteadiness although he did not report any falls over the past 2 weeks.  His biggest concern now is for his wife.  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  She is meeting with her cardiologist again next week to see what possible treatment is for her.  He still thinks there may be some issue with h er diaphragm but she is meeting with the pulmonologist also.  He is doing what he can to help her.  His daughter's birthday is March 11 so he knows that is a difficult time for both he and his wife next week.  Typically they do something to remember her but because of the wife's current health issues they cannot go to [place] as they had planned so they are talking about what to do to remember and celebrate her birthday.  We w ill continue to work on coping skills for helping with his anxiety and the progression of his Lewy body dementia diagnosis as well as processing his grief. Mental Status  Exam: Appearance:  Well Groomed  Behavior: Appropriate Motor: Pt. Reports some instability due to Lewey Body dementia Speech/Language:  Normal Rate Affect: Appropriate Mood: normal Thought process: normal Thought content:  WNL Sensory/Perceptual disturbance s:  WNL Orientation: oriented to person, place, and situation Attention: Good Concentration: Good Memory: Impaired short term Fund of knowledge:  Good Insight:  Good Judgment:  Good Impulse Control: Good  Risk Assessment: Danger to Self: No Self-injurious Behavior: No Danger to Others: No Duty to Warn:no Physical Aggression / Violence:No  Access to Firearms a concern: No  Gang Involvement:No  Treatment plan: Will em ploy cognitive behavioral therapy as well as grief and loss therapy for relief of anxiety and grief.  Goals of grief therapy or to have a healthier response to the loss of his daughter and less sadness as evidenced by patient report in therapy notes as well as to help him feel less overwhelmed by her loss.  Goals are to see a 50% reduction in grief symptoms over the next 6 months.  I will encourage the use of the patient telling his Ceclia Cohens story about his daughter including encouraging him to bring pictures and memorabilia to help process his feelings including any feelings of guilt associated with her loss.  We will use cognitive behavioral therapy to help identify and change anxiety producing thoughts and behavior patterns so as to improve his ability to better manage anxiety and stress, and manage thoughts and worrisome thinking contributing to anxiety.  We will als o refresh and encourage dialectical behavior therapy distress tolerance and mindfulness skills  with the intention of reducing anxiety by 50% over the next 6 months. Progress: 30% Interventions: Cognitive Behavioral Therapy  Diagnosis:PTSD, Bi-Polar D/O  Plan: F/U/ appointments scheduled weekly.  Cecile Coder,  [facility]                                                                                                                    Cecile Coder, [facility]  [facility] Counselor/Therapist Progress Note  Patient ID: Fabyan Loughmiller, MRN: [id number],    Date: 04/01/2024 This session was held via video teletherapy. The patient consented to the video teletherapy and was located in his home during this session. He is aware it is the responsibility of the patient to secure confidentiality on his end of the session. The provider was in a private home of fice for the duration of this session.   The patient arrived  on time for her Caregility session  Time Spent: 57 minutes, 2 PM to 2:57 PM  Treatment Type: Individual Therapy Reported Symptoms: anxiety, panic attacks  The patient reported that he had not been sleeping well so he did speak with his psychiatrist about adjusting some meds.  Initially they adjusted too much up and he said he felt groggy for the entire next day but th ey have found a combination now where he feels like he is getting more rested and only feels a little drowsy shortly after waking up..  There have been a couple times where he stumbled and one of the times he felt down but did not get hurt the other times he was able to catch himself.  He is trying to stay as active as he can walking daily and fishing with his wife when he can.  His biggest anxiety now is his wife has an Chief Executive Officer y on her diaphragm in 2 weeks so he knows that will be a fairly significant recovery time but also sees the difficulties she is having now and wants her to get some relief..  His son and daughter-in-law will be supports for them also.  He continues to speak with friends and family members as encouragement.  He also feels that he is grieving appropriately writing to his daughter on a regular basis about the things that are going on in hi s and his wife's lives.  We will continue to work on coping skills for helping with his anxiety and the progression of his Lewy body dementia diagnosis as well as processing his grief. 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 Speech/Language:  Normal Rate Affect: Appropriate Mood: normal Thought process: normal Thought content:  WNL  Sensory/Perceptual disturbances:  WNL Orientation: oriented to person, place, and situation Attention: Good Concentration: Good Memory: Impaired short term Fund of knowledge:  Good Insight:  Good Judgment:  Good Impulse  Control: Good  Risk Assessment: Danger to Self: No Self-injurious Behavior: No Danger to Others: No Duty to Warn:no Physical Aggression / Violence:No  Access to Firearms a concern: No  Arle[employer] nt:No  Treatment plan: Will employ cognitive behavioral therapy as well as grief and loss therapy for relief of anxiety and grief.  Goals of grief therapy or to have a healthier response to the loss of his daughter and less sadness as evidenced by patient report in therapy notes as well as to help him feel less overwhelmed by her loss.  Goals are to see a 50% reduction in grief symptoms over the next 6 months.  I will encourage the use  of the patient telling his grief story about his daughter including encouraging him to bring pictures and memorabilia to help process his feelings including any feelings of guilt associated with her loss.  We will use cognitive behavioral therapy to help identify and change anxiety producing thoughts and behavior patterns so as to improve his ability to  better manage anxiety and stress, and manage thoughts and worrisome thinking contri buting to anxiety.  We will also refresh and encourage dialectical behavior therapy distress tolerance and mindfulness skills with the intention of reducing anxiety by 50% over the next 6 months. Progress: 30% Interventions: Cognitive Behavioral Therapy  Diagnosis:PTSD, Bi-Polar D/O  Plan: F/U/ appointments scheduled weekly.  Cecile Coder, [facility]                                                                                                                    Cecile Coder, [facility]               Cecile Coder, [facility]               Cecile Coder, [facility]               Cecile Coder, [facility]               Cecile Coder, [facility]               Cecile Coder, [facility]                Cecile Coder, [facility]               Cecile Coder, [facility]  [facility] Counselor/Stephen Myers herapist Progress Note  Patient ID: Quinnton Bury, MRN: [id number],    Date: 04/01/2024 This session was held via video teletherapy. The patient consented to the video teletherapy and was located in his home during this session. He is aware it is the responsibility of the patient to secure confidentiality on his end of the session. The provider was in a private home office for the duration of this session.   The patient arrive d on time for her Caregility session  Time Spent: 59 minutes, 2 PM to 2:59 PM  Treatment Type: Individual Therapy Reported Symptoms: anxiety, panic  attacks The patient said he jokingly said to his wife that he had not had any pain issues in the cervical area and then a few days ago he started having pain in his neck and shoulders.  He now sits related to degenerative disk and has some consistent pain somewhere in his spine with tha Stephen Myers but jokingly says it just depends on Wednesday which part of his body is decides to show up and hurt.  He is doing the best that he can.  He is still walking which he knows is good for him but does not have right now the strength to do as much in the morning as he has done in the past so he is denying that to 3 times a day for consistency.  He is still doing some positive things and that he is staying in touch with his brother and oth er family members.  He is reconnecting with friends from high school and he is which she is enjoying.  One of them has had a similar experience in that she lost her son in the same way the patient lost his daughter.  He said they did not think and Easter in which they set a place for his daughter at the table and put her picture there.  He initially had reservations about doing that he and his wife had made everyone feel as if she was r eally there with him.  He still feels that he is grieving in a healthy way.  He and his wife have always taken a trip around both of their birthdays which are 2 days apart.  That is now close to his daughter died so they have started taking some of her rashes with him and sprinkling them where they go as a way of remembrance.  They are deciding what that looks like this year in July.  He does contract for safety having no thoughts of  hurting himself or anyone else. We will continue to work on coping skills for helping with his anxiety and the progression of his Lewy body dementia diagnosis as well as processing his grief. Mental Status Exam: Appearance:  Well Groomed  Behavior: Appropriate Motor: Pt. Reports some instability due to Lewey Body  dementia Speech/Language:  Normal Rate Affect: Appropriate Mood: normal Thought process: normal Thought content:   WNL Sensory/Perceptual disturbances:  WNL Orientation: oriented to person, place, and situation Attention: Good Concentration: Good Memory: Impaired short term Fund of knowledge:  Good Insight:  Good Judgment:  Good Impulse Control: Good  Risk Assessment: Danger to Self: No Self-injurious Behavior: No Danger to Others: No Duty to Warn:no Physical Aggression / Violence:No  Access to Firearms a concern: No  Gang Inv olvement:No  Treatment plan: Will employ cognitive behavioral therapy as well as grief and loss therapy for relief of anxiety and grief.  Goals of grief  therapy or to have a healthier response to the loss of his daughter and less sadness as evidenced by patient report in therapy notes as well as to help him feel less overwhelmed by her loss.  Goals are to see a 50% reduction in grief symptoms over the next 6 months.  I will encourage Stephen Myers he use of the patient telling his grief story about his daughter including encouraging him to bring pictures and memorabilia to help process his feelings including any feelings of guilt associated with her loss.  We will use cognitive behavioral therapy to help identify and change anxiety producing thoughts and behavior patterns so as to improve his ability to better manage anxiety and stress, and manage thoughts and worrisome thinking  contributing to anxiety.  We will also refresh and encourage dialectical behavior therapy distress tolerance and mindfulness skills with the intention of reducing anxiety by 50% over the next 6 months. Progress: 30% Interventions: Cognitive Behavioral Therapy  Diagnosis:PTSD, Bi-Polar D/O  Plan: F/U/ appointments scheduled weekly.  Cecile Coder, [facility]                                                                                                                     Cecile Coder, [facility]  [place] Counselor/Therapist Progress Note  Patient ID: Gale Hulse, MRN: [id number],    Date: 04/01/2024 This session was held via video teletherapy. The patient consented to the video teletherapy and was located in his home during this session. He is aware it is the responsibility of the patient to secure confidentiality on his end of the session.  The provider was in a private home office for the duration of this session.   The patient arrived on time for her Caregility session  Time Spent: 58 minutes, 2 PM to 2:58 PM   Treatment Type: Individual Therapy Reported Symptoms: anxiety, panic attacks A fairly difficult week because it was his daughter's birthday.  He said this year was more difficult than last year but he could not pinpoint why.  They did remember her about goi ng out to a nice restaurant which is something her daughter always wanted to do.  He said they were able to laugh and joke about funny things that his daughter had said or done which is not something they were able to do this time last year so he saw that as healthy grieving.  He knows that the time change threw things off a little bit for everybody and his wife still is not feeling well and his back has been bothering him.  He was not  able to go to his first round of physical therapy this week because his back was bothering him more.  He has been thinking a lot more about his daughter which he feels comfortable with.  He is thankful that it is getting warmer and staying light longer so he can get back to some of the coping skills such as fishing that he has done in the past.   We will continue to work on coping skills for helping with his anxiety and the progressi on of his Lewy body dementia diagnosis as well as processing his grief. 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 Speech/Language:  Normal Rate Affect: Appropriate Mood: normal Thought process: normal Thought content:  WNL Sensory/Perceptual disturbances:  WNL Orientation: oriented to person, place, and situation Attention: Good Co ncentration: Good Memory: Impaired short term Fund of knowledge:  Good Insight:  Good Judgment:  Good Impulse Control: Good  Risk Assessment: Danger to Self: No Self-injurious Behavior: No Danger to Others: No Duty to Warn:no Physical Aggression / Violence:No  Access to Firearms a concern: No  Gang Involvement:No  Treatment plan: Will employ  cognitive behavioral therapy as well as grief and loss therapy for relief of an xiety and grief.  Goals of grief therapy or to have a healthier response to the loss of his daughter and less sadness as evidenced by patient report in therapy notes as well as to help him feel less overwhelmed by her loss.  Goals are to see a 50% reduction in grief symptoms over the next 6 months.  I will encourage the use of the patient telling his grief story about his daughter including encouraging him to bring pictures and memorabi lia to help process his feelings including any feelings of guilt associated with her loss.  We will use cognitive behavioral therapy to help identify and change anxiety producing thoughts and behavior patterns so as to improve his ability to better manage anxiety and stress, and manage thoughts and worrisome thinking contributing to anxiety.  We will also refresh and encourage dialectical behavior therapy distress  tolerance and mindfuln ess skills with the intention of reducing anxiety by 50% over the next 6 months. Progress: 30% Interventions: Cognitive Behavioral Therapy  Diagnosis:PTSD, Bi-Polar D/O  Plan: F/U/ appointments scheduled weekly.  Cecile Coder, [facility]                                                                                                                    Cecile Coder, [facility]               Cecile Coder, [facility]               Cecile Coder, [facility]  [place] [facility] Counselor/Therapist Progress Note  Patient ID: Zylen Wenig, MRN: [id number],    Date: 04/01/2024 This session was held via video teletherapy. The patient consented to the video teletherapy and was located in his home during this session. He is aware it is the responsibility of the patient to secure confidentiality on his end of the session. The provider was in a private home office  for the duration of this session.   The patient arrived on time for her Caregility session  Time Spent: 1:05 PM until 2 PM, 55 minutes Treatment Type: Individual Therapy Reported Symptoms: anxiety, panic attacks The patient said he came in from his walk this morning and saw what he thought was some sort of tic from his wife.  It was not the first time it happened and he wonders if it might be tardive dyskinesia because of some  new medications that she is on.  She has a call into the doctor right now and hopes to hear something back soon.  They have narrowed down to the fact they think most of her discomfort  in breathing is coming from the diaphragm and they are looking for alternatives for treatment.  The patient continues to do those things which help him cope including walking, fishing when he can, spending time with his son and daughter-in-law and grands on.  He also has a way of continued coping with grief he is writing letters to his daughters as a way of expressing his feelings and says that is very beneficial.  He appears to be grieving in a healthy way but he knows that he to year anniversary of her death is coming up and there is some grief there but says it is not overwhelming.  We will continue to work on coping skills for helping with his anxiety and the progression of his Le wy body dementia diagnosis as well as processing his grief. 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 Speech/Language:  Normal Rate Affect: Appropriate Mood: normal Thought process: normal Thought content:  WNL Sensory/Perceptual disturbances:  WNL Orientation: oriented to person, place, and situation Attention: Good Concentration:  Good Memory: Impaired short term Fund of knowledge:  Good Insight:  Good Judgment:  Good Impulse Control: Good  Risk Assessment: Danger to Self: No Self-injurious Behavior: No Danger to Others: No Duty to Warn:no Physical Aggression / Violence:No  Access to Firearms a concern: No  Gang Involvement:No  Treatment plan: Will employ cognitive behavioral therapy as well as grief and loss therapy for relief of anxiety and gr ief.  Goals of grief therapy or to have a healthier response to the loss of his daughter and less sadness as evidenced by patient report in therapy notes as well as to help him feel less overwhelmed by her loss.  Goals are to see a 50% reduction in grief symptoms over the next 6 months.  I will encourage the use of the patient telling his grief story about his  daughter including encouraging him to bring pictures and memorabilia to help  process his feelings including any feelings of guilt associated with her loss.  We will use cognitive behavioral therapy to help identify and change anxiety producing thoughts and behavior patterns so as to improve his ability to better manage anxiety and stress, and manage thoughts and worrisome thinking contributing to anxiety.  We will also refresh and encourage dialectical behavior therapy distress tolerance and  mindfulness skills w ith the intention of reducing anxiety by 50% over the next 6 months. Progress: 30% Interventions: Cognitive Behavioral Therapy  Diagnosis:PTSD, Bi-Polar D/O  Plan: F/U/ appointments scheduled weekly.  Cecile Coder, [facility]                                                                                                                    Cecile Coder, [facility]  [place] [facility] Counselor/Therapist Progress Note  Patient  ID: Jeziel Hoffmann, MRN: [id number],    Date: 04/01/2024 This session was held via video teletherapy. The patient consented to the video teletherapy and was located in his home during this session. He is aware it is the responsibility of the patient to secure confidentiality on his end of the session. The provider was in a private home office for the duration of this session.   The patient arrived on time for her Caregility sessi on  Time Spent: 59 minutes, 2 PM to 2:59 PM  Treatment Type: Individual Therapy Reported Symptoms: anxiety, panic attacks The patient continues to present with physical pain and both his back and his knee.  He started with a new physical therapist and told him that there was a lot of arthritis in both knees and or certain things he cannot do but they had him do that anyway and now for the past 3 days he has been in significant knee  pain.  He has continued to walk because he knows that is good for him in various ways but says it has been painful.  His wife also was not feeling well but she is going back to the doctor next week to have an ablation and hopes that will bring her some relief.  For the most part he has been at the last couple weeks reaching back out and hearing from some old friends as well as time with his son and son's family.  He says that his wife' s cousin is coming down for a weekend soon and they enjoy time with her and that he will go to his brother's house sometime in the next few weeks.  He recognizes the need for people  that he cares about in helping with his depression. We will continue to work on coping skills for helping with his anxiety and the progression of his Lewy body dementia diagnosis as well as processing his grief. Mental Status Exam: Appearance:  Well Groom ed  Behavior: Appropriate Motor: Pt. Reports some instability due to Lewey Body dementia Speech/Language:  Normal Rate Affect: Appropriate Mood: normal Thought process: normal Thought content:  WNL Sensory/Perceptual disturbances:  WNL Orientation: oriented to person, place, and situation Attention: Good Concentration: Good Memory: Impaired short term Fund of knowledge:  Good Insight:  Good Judgment:  Good Impulse C ontrol: Good  Risk Assessment: Danger to Self: No Self-injurious Behavior: No Danger to Others: No Duty to Warn:no Physical Aggression / Violence:No  Access to Firearms a concern: No  Gang Involvement:No  Treatment plan: Will employ cognitive behavioral therapy as well as grief and loss therapy for relief of anxiety and grief.  Goals of grief therapy or to have a healthier response to the loss of his daughter and less sadness  as evidenced by patient report in therapy notes as well as to help him feel less overwhelmed by her loss.  Goals are to see a 50% reduction in grief symptoms over the next 6 months.  I will encourage the use of the patient telling his grief story about his daughter including encouraging him to bring pictures and memorabilia to help process his feelings including any feelings of guilt associated with her loss.  We will use cognitive beh avioral therapy to help identify and change anxiety producing thoughts and behavior patterns so as to improve his ability to better manage anxiety and stress, and manage thoughts and worrisome thinking contributing to anxiety.  We will also  refresh and encourage dialectical behavior therapy distress tolerance and mindfulness skills with the intention of reducing  anxiety by 50% over the next 6 months. Progress: 30% Interventions: Cogni tive Behavioral Therapy  Diagnosis:PTSD, Bi-Polar D/O  Plan: F/U/ appointments scheduled weekly.  Cecile Coder, [facility]                                                                                                                   Cecile Coder, [facility]               Cecile Coder, [facility]                Cecile Coder, [facility]               Cecile Coder, [facility]  [place] [facility] Counsel or/Therapist Progress Note  Patient ID: Judd Mccubbin, MRN: [id number],    Date: 04/01/2024 This  session was held via video teletherapy. The patient consented to the video teletherapy and was located in his home during this session. He is aware it is the responsibility of the patient to secure confidentiality on his end of the session. The provider was in a private home office for the duration of this session.   The patient ar rived on time for her Caregility session  Time Spent: 57 minutes, 2 PM to 2:57 PM  Treatment Type: Individual Therapy Reported Symptoms: anxiety, panic attacks The patient is still having significant back pain.  He cannot sit anywhere for very long and has to be careful where he sits.  There is still some unsteadiness although he did not report any falls over the past 2 weeks.  His biggest concern now is for his wife.  She has been  having some difficulty breathing and they did a test so they found a small hole on the back side of her heart.  She is meeting with her cardiologist again next week to see what possible treatment is for her.  He still thinks there may be some issue with her diaphragm but she is meeting with the pulmonologist also.  He is doing what he can to help her.  His daughter's birthday is March 11 so he knows that is a difficult time for both  he and his wife next week.  Typically they do something to remember her but because of the wife's current health issues they cannot go to [place] as they had planned so they are talking about what to do to remember and celebrate her birthday.  We will continue to work on coping skills for helping with his anxiety and the progression of his Lewy body dementia diagnosis as well as processing his grief. Mental Status Exam: Appear ance:  Well Groomed  Behavior: Appropriate Motor: Pt. Reports some instability due to Lewey Body dementia Speech/Language:  Normal Rate Affect: Appropriate Mood: normal Thought process: normal Thought content:  WNL Sensory/Perceptual disturbances:  WNL Orientation: oriented  to person, place, and situation Attention: Good Concentration: Good Memory: Impaired short term Fund of knowledge:  Good Insight:  Good Judgment:   Good Impulse Control: Good  Risk Assessment: Danger to Self: No Self-injurious Behavior: No Danger to Others: No Duty to Warn:no Physical Aggression / Violence:No  Access to Firearms a concern: No  Gang Involvement:No  Treatment plan: Will employ cognitive behavioral therapy as well as grief and loss therapy for relief of anxiety and grief.  Goals of grief therapy or to have a healthier response to the loss of his daughter  and less sadness as evidenced by patient report in therapy notes as well as to help him feel less overwhelmed by her loss.  Goals are to see a 50% reduction in grief symptoms over the next 6 months.  I will encourage the use of the patient telling his grief story about his daughter including encouraging him to bring pictures and memorabilia to help process his feelings including any feelings of guilt associated with her loss.  We will  use cognitive behavioral therapy to help identify and change anxiety producing thoughts and behavior patterns so as to improve his ability to better manage anxiety and stress, and manage thoughts and worrisome thinking contributing to anxiety.  We will also refresh and encourage dialectical behavior therapy distress tolerance and mindfulness  skills with the intention of reducing anxiety by 50% over the next 6 months. Progress: 30% Int erventions: Cognitive Behavioral Therapy  Diagnosis:PTSD, Bi-Polar D/O  Plan: F/U/ appointments scheduled weekly.  Cecile Coder, [facility]                                                                                                                   Cecile Coder, [facility]  [place] [facility] Counselor/Therapist Progress Note  Patient ID: Adyn Serna, MRN: [id number],    Date: 04/01/2024 This session was held via v ideo teletherapy. The patient consented to the video teletherapy and was located in his home during this session. He is aware it is the responsibility of the patient to secure confidentiality on his end of the session. The provider was in a private home office for the duration of this session.   The patient arrived on time for her Caregility session  Time Spent: 58 minutes, 2 PM to 2:58 PM  Treatment Type: Individual Therapy Repo rted Symptoms: anxiety, panic attacks A fairly difficult week because it was his daughter's birthday.  He said this year was more difficult than last year but he could not pinpoint  why.  They did remember her about going out to a nice restaurant which is something her daughter always wanted to do.  He said they were able to laugh and joke about funny things that his daughter had said or done which is not something they were able to do  this time last year so he saw that as healthy grieving.  He knows that the time change threw things off a little bit for everybody and his wife still is not feeling well and his back has been bothering him.  He was not able to go to his first round of physical therapy this week because his back was bothering him more.  He has been thinking a lot more about his daughter which he feels comfortable with.  He is thankful that it is getting  warmer and staying light longer so he can get back to some of the coping skills such as fishing that he has done in the past.   We will continue to work on coping skills for helping with his anxiety and the progression of his Lewy body dementia diagnosis as well as processing his grief. 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 Speech/Lan guage:  Normal Rate Affect: Appropriate Mood: normal Thought process: normal Thought content:  WNL Sensory/Perceptual disturbances:  WNL Orientation: oriented to person, place, and situation Attention: Good Concentration: Good Memory: Impaired short term Fund of knowledge:  Good Insight:  Good Judgment:  Good Impulse Control: Good  Risk Assessment: Danger to Self: No Self-injurious Behavior: No Danger to Others: No  Duty to Warn:no Physical Aggression / Violence:No  Access to Firearms a concern: No  Gang Involvement:No  Treatment plan: Will employ cognitive behavioral therapy as well as grief and loss therapy for relief of anxiety and grief.  Goals of grief therapy or to have a healthier response to the loss of his daughter and less sadness as evidenced by patient report  in therapy notes as well as to help him feel less overwhelmed by her los s.  Goals are to see a 50% reduction in grief symptoms over the next 6 months.  I will encourage the use of the patient telling his grief story about his daughter including encouraging him to bring pictures and memorabilia to help process his feelings including any feelings of guilt associated with her loss.  We will use cognitive behavioral therapy to help identify and change anxiety producing thoughts and behavior patterns so as to im prove his ability to better manage anxiety and stress, and manage thoughts and worrisome thinking contributing to anxiety.  We will also refresh and encourage dialectical behavior therapy  distress tolerance and mindfulness skills with the intention of reducing anxiety by 50% over the next 6 months. Progress: 30% Interventions: Cognitive Behavioral Therapy  Diagnosis:PTSD, Bi-Polar D/O  Plan: F/U/ appointments scheduled weekly.   Cecile Coder, [facility]                                                                                                                   Cecile Coder, [facility]               Cecile Coder, [facility]               Cecile Coder, [facility]  [place] [facility] Counselor/Therapist Progress Note  Patient ID: Talor Cheema, MRN: [id number],    Date: 04/01/2024 This session was held via video teletherapy. The patient  consented to the video teletherapy and was located in his home during this session. He is aware it is the responsibility of the patient to secure confidentiality on his end of the session. The provider was in a private home office for the duration of this session.   The patient arrived on time for her Caregility session  Time Spent: 57 minutes, 2 PM to 2:57 PM  Treatment Type: Individual Therapy Reported Symptoms: anxiety, panic  attacks The patient is still having significant back pain.  He cannot sit anywhere for very long and has to be careful where he sits.  There is still some unsteadiness although he did not report any falls over the past 2 weeks.  His biggest concern now is for his wife.  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  She is meeting with her cardiologist again  next week to see what possible treatment is for her.  He still thinks there may be some issue with her diaphragm but she is meeting with the pulmonologist also.  He is doing what he  can to help her.  His daughter's birthday is March 11 so he knows that is a difficult time for both he and his wife next week.  Typically they do something to remember her but because of the wife's current health issues they cannot go to [place] as  they had planned so they are talking about what to do to remember and celebrate her birthday.  We will continue to work on coping skills for helping with his anxiety and the progression of his Lewy body dementia diagnosis as well as processing his grief. 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 Speech/Language:  Normal Rate Affect: Appro priate Mood: normal Thought process: normal Thought content:  WNL Sensory/Perceptual disturbances:  WNL Orientation: oriented to person, place, and situation Attention: Good Concentration: Good Memory: Impaired short term Fund of knowledge:  Good Insight:  Good Judgment:  Good Impulse Control: Good  Risk Assessment: Danger to Self: No Self-injurious Behavior: No Danger to Others: No Duty to Warn:no Physical Aggres sion / Violence:No  Access to Firearms a concern: No  Gang Involvement:No  Treatment plan: Will employ cognitive behavioral therapy as well as grief and loss therapy for relief of anxiety and grief.  Goals of grief therapy or to have a healthier response to the loss of his daughter and less sadness as evidenced by patient report in therapy notes as well as to help him feel less overwhelmed by her loss.  Goals are to see a 50% reducti on in grief symptoms over the next 6 months.  I will encourage the use of the patient telling his grief story about his daughter including encouraging him to bring pictures and memorabilia to help process his feelings including any feelings of guilt associated with her loss.  We will use cognitive behavioral therapy to help identify and change anxiety producing  thoughts and behavior patterns so as to improve his ability to better manage  anxiety and stress, and manage thoughts and worrisome thinking contributing to anxiety.  We will also refresh and encourage dialectical behavior therapy distress tolerance and mindfulness  skills with the intention of reducing anxiety by 50% over the next 6 months. Progress: 30% Interventions: Cognitive Behavioral Therapy  Diagnosis:PTSD, Bi-Polar D/O  Plan: F/U/ appointments scheduled weekly.  Cecile Coder, [facility]                                                                                                                    Cecile Coder, [facility]  [facility] Counselor/Therapist Progress Note  Patient ID: Jerin Franzel, MRN: [id number],    Date: 04/01/2024 This session was held via video teletherapy. The patient consented to the video teletherapy and was located in his home during this session. He is aware it is the responsibility of Stephen Myers he patient to secure confidentiality on his end of the session. The provider was in a private home office for the duration of this session.   The patient arrived on time for her Caregility session  Time Spent: 57 minutes, 2 PM to 2:57 PM  Treatment Type: Individual Therapy Reported Symptoms: anxiety, panic attacks  We will continue to work on coping skills for helping with his anxiety and the progression of his Lewy body dement ia diagnosis as well as processing his grief. 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 Speech/Language:  Normal Rate Affect: Appropriate Mood: normal Thought process: normal Thought content:  WNL Sensory/Perceptual disturbances:  WNL Orientation: oriented to person, place, and situation Attention: Good Concentration: Good Memory:  Impaired short term Fund of knowledge:  Good Insight:  Good Judgment:  Good Impulse Control: Good  Risk Assessment: Danger to Self: No Self-injurious Behavior: No Danger to Others: No Duty to Warn:no Physical Aggression / Violence:No  Access to Firearms a concern: No  Gang Involvement:No  Treatment plan: Will employ cognitive behavioral therapy as well as grief and loss therapy for relief of anxiety and grief.  Goals of  grief therapy or to have a healthier response to the loss of his daughter and less sadness as evidenced by patient report in therapy notes as well as to help him feel less overwhelmed  by her loss.  Goals are to see a 50% reduction in grief symptoms over the next 6 months.  I will encourage the use of the patient telling his grief story about his daughter including encouraging him to bring pictures and memorabilia to help process his fe elings including any feelings of guilt associated with her loss.  We will use cognitive behavioral therapy to help identify and change anxiety producing thoughts and behavior patterns so as to improve his ability to better manage anxiety and stress, and manage thoughts and worrisome thinking contributing to anxiety.  We will also refresh and encourage dialectical behavior therapy distress tolerance and mindfulness skills with the intent ion of reducing anxiety by 50% over the next 6 months. Progress: 30% Interventions: Cognitive Behavioral Therapy  Diagnosis:PTSD, Bi-Polar D/O  Plan: F/U/ appointments scheduled weekly.  Cecile Coder, [facility]                                                                                                                    Cecile Coder, [facility]               Cecile Coder, [facility]               Cecile Coder, [facility]  Cecile Coder, [facility]               Cecile Coder, [facility]               Cecile Coder, [facility]               Cecile Coder, [facility]  [facility] Counselor/Therapist Progress Note  Patient ID: Juandiego Kolenovic, MRN: [id number],    Date: 04/01/2024 This session was held via video teletherapy. The patient consented to the video teletherapy and w as located in his home during this session. He is aware it is the responsibility of the patient to secure confidentiality on his end of the session. The provider was in a private home office for the duration of this session.   The patient arrived on time for her Caregility session  Time Spent: 39 minutes, 2 PM to 2:39 PM  Treatment Type: Individual Therapy Reported Symptoms: anxiety, panic attacks The patient was not feeling wel l today.  He started feeling really earlier in the week having a fever bad headaches body aches.  He felt a little better yesterday but feels a little bit of a resurgence in headaches  and body aches and is running a low-grade fever.  His wife is feeling some of the same symptoms.  For the most part he says he is doing well.  They did increase his memory medication because they were starting to see some increasing decline in short-term m emory and he said until recently his long-term memory had been good but he was starting to see a difference in how he used to markers to remember what happened at certain times historically.  He feels the medication has helped and has brightened his mood a little also.  He is still trying to walk every day and fish when he can and has enjoyed watching the [employer] as a good distraction.  He reports that he feels he is man aging mood fairly well.  He has an appointment in 2 weeks.  He does contract for safety having no thoughts of hurting himself or anyone else. We will continue to work on coping skills for helping with his anxiety and the progression of his Lewy body dementia diagnosis as well as processing his grief. Mental Status Exam: Appearance:  Well Groomed  Behavior: Appropriate Motor: Pt. Reports some instability due to Lewey Body dement ia Speech/Language:  Normal Rate Affect: Appropriate Mood: normal Thought process: normal Thought content:  WNL Sensory/Perceptual disturbances:  WNL Orientation: oriented to person, place, and situation Attention: Good Concentration: Good Memory: Impaired short term Fund of knowledge:  Good Insight:  Good Judgment:  Good Impulse Control: Good  Risk Assessment: Danger to Self: No Self-injurious Behavior: No Danger  to Others: No Duty to Warn:no Physical Aggression / Violence:No  Access to Firearms a concern: No  Gang Involvement:No  Treatment plan: Will employ cognitive behavioral therapy as well as grief and loss therapy for relief of anxiety and grief.  Goals of grief therapy or to have a healthier response to the loss of his daughter and less sadness as evidenced by  patient report in therapy notes as well as to help him feel less overwhel med by her loss.  Goals are to see a 50% reduction in grief symptoms over the next 6 months.  I will encourage the use of the patient telling his grief story about his daughter including encouraging him to bring pictures and memorabilia to help process his feelings including any feelings of guilt associated with her loss.  We will use cognitive behavioral therapy to help identify and change anxiety producing thoughts and behavior patter ns so as to improve his ability to better manage anxiety and stress, and manage thoughts and worrisome thinking contributing to anxiety.  We will also refresh  and encourage dialectical behavior therapy distress tolerance and mindfulness skills with the intention of reducing anxiety by 50% over the next 6 months. Progress: 30% Interventions: Cognitive Behavioral Therapy  Diagnosis:PTSD, Bi-Polar D/O  Plan: F/U/ appointments schedul ed weekly.  Cecile Coder, [facility]                                                                                                                   Cecile Coder, [facility]  [facility] Counselor/Therapist Progress Note  Patient ID: Tanvir Hipple, MRN: [id number],    Date: 04/01/2024 This session was held via video teletherapy. The patient consented to the video teletherapy and was located in his home during this se ssion. He is aware it is the responsibility of the patient to secure confidentiality on his end of the session. The provider was in a private home office for the duration of this session.   The patient arrived on time for her Caregility session  Time Spent: 58 minutes, 2 PM to 2:58 PM  Treatment Type: Individual Therapy Reported Symptoms: anxiety, panic attacks A fairly difficult week because it was his daughter's birthday.  He  said this year was more difficult than last year but he could not pinpoint why.  They did remember her about going out to a nice restaurant which is something her daughter always wanted to do.  He said they were able to laugh and joke about funny things that his daughter had said or done which is not something they were able to do this time last year so he saw that as healthy grieving.  He knows that the time change threw things off a l ittle bit for everybody and his wife still is not feeling well and his back has been bothering him.  He was not able to go to his first round of physical therapy this week because his  back was bothering him more.  He has been thinking a lot more about his daughter which he feels comfortable with.  He is thankful that it is getting warmer and staying light longer so he can get back to some of the coping skills such as fishing that he has  done in the past.   We will continue to work on coping skills for helping with his anxiety and the progression of his Lewy body dementia diagnosis as well as processing his grief. 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 Speech/Language:  Normal Rate Affect: Appropriate Mood: normal Thought process: normal Thought content:  WNL Se nsory/Perceptual disturbances:  WNL Orientation: oriented to person, place, and situation Attention: Good Concentration: Good Memory: Impaired short term Fund of knowledge:  Good Insight:  Good Judgment:  Good Impulse Control: Good  Risk Assessment: Danger to Self: No Self-injurious Behavior: No Danger to Others: No Duty to Warn:no Physical Aggression / Violence:No  Access to Firearms a concern: No  Gang Involvement: No  Treatment plan: Will employ cognitive behavioral therapy as well as grief and loss therapy for relief of anxiety and grief.  Goals of grief therapy or to have a healthier response to the loss of his daughter and less sadness as evidenced by patient report in therapy notes as well as to help him feel less overwhelmed by her loss.  Goals are to see a 50% reduction in grief symptoms over the next 6 months.  I will encourage the use of  the patient telling his grief story about his daughter including encouraging him to bring pictures and memorabilia to help process his feelings including any feelings of guilt associated with her loss.  We will use cognitive behavioral therapy to help identify and change anxiety producing thoughts and behavior patterns so as to improve his ability to better manage  anxiety and stress, and manage thoughts and worrisome thinking contribut ing to anxiety.  We will also refresh and encourage dialectical behavior therapy  distress tolerance and mindfulness skills with the intention of reducing anxiety by 50% over the next 6 months. Progress: 30% Interventions: Cognitive Behavioral Therapy  Diagnosis:PTSD, Bi-Polar D/O  Plan: F/U/ appointments scheduled weekly.  Cecile Coder, [facility]                                                                                                                    Cecile Coder, [facility]               Cecile Coder, [facility]               Cecile Coder, [facility]  [facility] Counselor/Therapist Progress Note  Patient ID: Latif Nazareno, MRN: [id number],    Date: 04/01/2024 This session was held via video teletherapy. The patient consented to the video teletherapy and was located in his home during this session. He is aware it is the responsibili ty of the patient to secure confidentiality on his end of the session. The provider was in a private home office for the duration of this session.   The patient arrived on time for her Caregility session  Time Spent: 1:05 PM until 2 PM, 55 minutes Treatment Type: Individual Therapy Reported Symptoms: anxiety, panic attacks The patient said he came in from his walk this morning and saw what he thought was some sort of tic from hi s wife.  It was not the first time it happened and he wonders if it might be tardive dyskinesia because of some new medications that she is on.  She has a call into the doctor right now and hopes to hear something back soon.  They have narrowed down to the fact they think most of her discomfort in breathing is coming from the diaphragm and they are looking for alternatives for treatment.  The patient continues to do those things which  help him cope including walking, fishing when he can, spending time with his son and daughter-in-law and grandson.  He also has a way of continued coping with grief he is writing letters to his daughters as a way of expressing his feelings and says that is very beneficial.  He appears to be grieving in a healthy way but he knows that he to year anniversary of her death is coming up and there is some grief there but says it is not overw helming.  We will continue to work on coping skills for helping with his anxiety and the progression of his Lewy body dementia diagnosis as well as processing his grief. Mental  Status Exam: Appearance:  Well Groomed  Behavior: Appropriate Motor: Pt. Reports some instability due to Lewey Body dementia Speech/Language:  Normal Rate Affect: Appropriate Mood: normal Thought process: normal Thought content:  WNL Sensory/Percep tual disturbances:  WNL Orientation: oriented to person, place, and situation Attention: Good Concentration: Good Memory: Impaired short term Fund of knowledge:  Good Insight:  Good Judgment:  Good Impulse Control: Good  Risk Assessment: Danger to Self: No Self-injurious Behavior: No Danger to Others: No Duty to Warn:no Physical Aggression / Violence:No  Access to Firearms a concern: No  Gang Involvement:No  Treatme nt plan: Will employ cognitive behavioral therapy as well as grief and loss therapy for relief of anxiety and grief.  Goals of grief therapy or to have a healthier response to the loss of his daughter and less sadness as evidenced by patient report in therapy notes as well as to help him feel less overwhelmed by her loss.  Goals are to see a 50% reduction in grief symptoms over the next 6 months.  I will encourage the use of the patient  telling his grief story about his daughter including encouraging him to bring pictures and memorabilia to help process his feelings including any feelings of guilt associated with her loss.  We will use cognitive behavioral therapy to help identify and change anxiety producing thoughts and behavior patterns so as to improve his ability to better manage anxiety and stress, and manage thoughts and worrisome thinking contributing to anxie ty.  We will also refresh and encourage dialectical behavior therapy distress tolerance and  mindfulness skills with the intention of reducing anxiety by 50% over the next 6 months. Progress: 30% Interventions: Cognitive Behavioral Therapy  Diagnosis:PTSD, Bi-Polar D/O  Plan: F/U/ appointments scheduled weekly.  Cecile Coder,  [facility]                                                                                                                    Cecile Coder, [facility]  [facility] Counselor/Therapist Progress Note  Patient ID: Ethel Veronica, MRN: [id number],    Date: 04/01/2024 This session was held via video teletherapy. The patient consented to the video teletherapy and was located in his home during this session. He is aware it is the responsibility of the patient to secure confidentiality on his end of the session. The provider was in a  private home office for the duration of this session.   The patient arrived  on time for her Caregility session  Time Spent: 59 minutes, 2 PM to 2:59 PM  Treatment Type: Individual Therapy Reported Symptoms: anxiety, panic attacks The patient continues to present with physical pain and both his back and his knee.  He started with a new physical therapist and told him that there was a lot of arthritis in both knees and or certain  things he cannot do but they had him do that anyway and now for the past 3 days he has been in significant knee pain.  He has continued to walk because he knows that is good for him in various ways but says it has been painful.  His wife also was not feeling well but she is going back to the doctor next week to have an ablation and hopes that will bring her some relief.  For the most part he has been at the last couple weeks reaching b ack out and hearing from some old friends as well as time with his son and son's family.  He says that his wife's cousin is coming down for a weekend soon and they enjoy time with her and that he will go to his brother's house sometime in the next few weeks.  He recognizes the need for people that he cares about in helping with his depression. We will continue to work on coping skills for helping with his anxiety and the progression of  his Lewy body dementia diagnosis as well as processing his grief. Mental Status Exam: Appearance:  Well Groomed  Behavior: Appropriate Motor: Pt. Reports some instability due to Lewey Body dementia Speech/Language:  Normal Rate Affect: Appropriate Mood: normal Thought process: normal Thought content:  WNL Sensory/Perceptual disturbances:  WNL Orientation: oriented to person, place, and situation Attention: Good Concent ration: Good Memory: Impaired short term Fund of knowledge:  Good Insight:  Good Judgment:  Good Impulse Control: Good  Risk Assessment: Danger to Self: No Self-injurious Behavior: No Danger to Others: No Duty to Warn:no Physical Aggression /  Violence:No  Access to Firearms a concern: No  Gang Involvement:No  Treatment plan: Will employ cognitive behavioral therapy as well as grief and loss therapy for relief of anxiety  and grief.  Goals of grief therapy or to have a healthier response to the loss of his daughter and less sadness as evidenced by patient report in therapy notes as well as to help him feel less overwhelmed by her loss.  Goals are to see a 50% reduction in grief symptoms over the next 6 months.  I will encourage the use of the patient telling his grief story about his daughter including encouraging him to bring pictures and memorabilia Stephen Myers o help process his feelings including any feelings of guilt associated with her loss.  We will use cognitive behavioral therapy to help identify and change anxiety producing thoughts and behavior patterns so as to improve his ability to better manage anxiety and stress, and manage thoughts and worrisome thinking contributing to anxiety.  We will also  refresh and encourage dialectical behavior therapy distress tolerance and mindfulness s kills with the intention of reducing anxiety by 50% over the next 6 months. Progress: 30% Interventions: Cognitive Behavioral Therapy  Diagnosis:PTSD, Bi-Polar D/O  Plan: F/U/ appointments scheduled weekly.  Cecile Coder, [facility]                                                                                                                    Cecile Coder, [facility]               Cecile Coder, [facility]               Cecile Coder, [employer]               Cecile Coder, [facility]  [facility] Counselor/Therapist Progress Note  Patient ID: Payson Evrard, MRN: [id number],    Date: 04/01/2024 This session was held via video teletherapy. The patient consented to the video teletherapy and was located in his home during this session. He is aware it is the responsibility of the patient to secure confidentiality on his end  of the session. The provider was in a private home office for the duration of this session.   The patient arrived on time for her Caregility session  Time Spent: 57 minutes, 2 PM to 2:57 PM  Treatment Type: Individual Therapy Reported Symptoms: anxiety, panic attacks The patient is still having significant back pain.  He cannot sit anywhere for very long and has to be careful where he sits.  There is still some unsteadiness alt hough he did not report any falls over the past 2 weeks.  His biggest concern now is for his wife.  She has been having some difficulty breathing and they did a test so they found a  small hole on the back side of her heart.  She is meeting with her cardiologist again next week to see what possible treatment is for her.  He still thinks there may be some issue with her diaphragm but she is meeting with the pulmonologist also.  He is doin g what he can to help her.  His daughter's birthday is March 11 so he knows that is a difficult time for both he and his wife next week.  Typically they do something to remember her but because of the wife's current health issues they cannot go to [place] as they had planned so they are talking about what to do to remember and celebrate her birthday.  We will continue to work on coping skills for helping with his anxiety and Stephen Myers he progression of his Lewy body dementia diagnosis as well as processing his grief. Mental Status Exam: Appearance:  Well Groomed  Behavior: Appropriate Motor: Pt. Reports some instability due to Lewey Body dementia Speech/Language:  Normal Rate Affect: Appropriate Mood: normal Thought process: normal Thought content:  WNL Sensory/Perceptual disturbances:  WNL Orientation: oriented to person, place, and situation Attenti on: Good Concentration: Good Memory: Impaired short term Fund of knowledge:  Good Insight:  Good Judgment:  Good Impulse Control: Good  Risk Assessment: Danger to Self: No Self-injurious Behavior: No Danger to Others: No Duty to Warn:no Physical Aggression / Violence:No  Access to Firearms a concern: No  Gang Involvement:No  Treatment plan: Will employ cognitive behavioral therapy as well as grief and loss therapy for  relief of anxiety and grief.  Goals of grief therapy or to have a healthier response to the loss of his daughter and less sadness as evidenced by patient report in therapy notes as well as to help him feel less overwhelmed by her loss.  Goals are to see a 50% reduction in grief symptoms over the next 6 months.  I will encourage the use of the patient telling his  grief story about his daughter including encouraging him to bring pictures  and memorabilia to help process his feelings including any feelings of guilt associated with her loss.  We will use cognitive behavioral therapy to help identify and change anxiety producing thoughts and behavior patterns so as to improve his ability to better manage anxiety and stress, and manage thoughts and worrisome thinking contributing to anxiety.  We will also refresh and encourage dialectical behavior therapy distress tolerance  and mindfulness  skills with the intention of reducing anxiety by 50% over the next 6 months. Progress: 30% Interventions: Cognitive Behavioral Therapy  Diagnosis:PTSD, Bi-Polar D/O  Plan: F/U/ appointments scheduled weekly.  Cecile Coder, [facility]                                                                                                                    Cecile Coder, [facility]  [facility] Counselor/Therapist P rogress Note  Patient ID: Fate Caster, MRN: [id number],    Date: 04/01/2024 This session was held via video teletherapy. The patient consented to the video teletherapy and was located in his home during this session. He is aware it is the responsibility of the patient to secure confidentiality on his end of the session. The provider was in a private home office for the duration of this session.   The patient arrived on time  for her Caregility session  Time Spent: 58 minutes, 2 PM to 2:58 PM  Treatment Type: Individual Therapy Reported Symptoms: anxiety, panic attacks A fairly difficult week because it was his daughter's birthday.  He said this year was more difficult than last year but he could not pinpoint why.  They did remember her about going out to a nice restaurant which is something her daughter always wanted to do.  He said they were able to l augh and joke about funny things that his daughter had said or done which is not something they were able to do this time last year so he saw that as healthy grieving.  He knows that the time change threw things off a little bit for everybody and his wife still is not feeling well and his back has been bothering him.  He was not able to go to his first round of physical therapy this week because his back was bothering him more.  He has  been thinking a lot more about his daughter which he feels comfortable with.  He is thankful that it is getting warmer and staying light longer so he can get back to some of the coping  skills such as fishing that he has done in the past.   We will continue to work on coping skills for helping with his anxiety and the progression of his Lewy body dementia diagnosis as well as processing his grief. Mental Status Exam: Appearance:  We ll Groomed  Behavior: Appropriate Motor: Pt. Reports some instability due to Lewey Body dementia Speech/Language:  Normal Rate Affect: Appropriate Mood: normal Thought process: normal Thought content:  WNL Sensory/Perceptual disturbances:  WNL Orientation: oriented to person, place, and situation Attention: Good Concentration: Good Memory: Impaired short term Fund of knowledge:  Good Insight:  Good Judgment:  Good I mpulse Control: Good  Risk Assessment: Danger to Self: No Self-injurious Behavior: No Danger to Others: No Duty to Warn:no Physical Aggression / Violence:No  Access to Firearms a concern: No  Gang Involvement:No  Treatment plan: Will employ cognitive behavioral therapy as well as grief and loss therapy for relief of anxiety and grief.  Goals of grief therapy or to have a healthier response to the loss of his daughter and less  sadness as evidenced by patient report in therapy notes as well as to help him feel less overwhelmed by her loss.  Goals are to see a 50% reduction in grief symptoms over the next 6 months.  I will encourage the use of the patient telling his grief story about his daughter including encouraging him to bring pictures and memorabilia to help process his feelings including any feelings of guilt associated with her loss.  We will use cogni tive behavioral therapy to help identify and change anxiety producing thoughts and behavior patterns so as to improve his ability to better manage anxiety and stress, and manage thoughts and worrisome thinking contributing to anxiety.  We will also refresh and encourage dialectical behavior therapy distress  tolerance and mindfulness skills with the intention of  reducing anxiety by 50% over the next 6 months. Progress: 30% Intervention s: Cognitive Behavioral Therapy  Diagnosis:PTSD, Bi-Polar D/O  Plan: F/U/ appointments scheduled weekly.  Cecile Coder, [facility]                                                                                                                   Cecile Coder, [facility]               Cecile Coder, [facility]                Cecile Coder, [facility]  [facility] Counselor/Therapist Progress Note  Patient ID: Miller Limehouse, MRN: [id number],    Date: 04/01/2024 This session was held via video teletherapy. The  patient consented to the video teletherapy and was located in his home during this session. He is aware it is the responsibility of the patient to secure confidentiality on his end of the session. The provider was in a private home office for the duration of this session.   The patient arrived on time for her Caregility session   Time Spent: 57 minutes, 2 PM to 2:57 PM  Treatment Type: Individual Therapy Reported Symptoms: anxiety, panic attacks The patient is still having significant back pain.  He cannot sit anywhere for very long and has to be careful where he sits.  There is still some unsteadiness although he did not report any falls over the past 2 weeks.  His biggest concern now is for his wife.  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  She is meeting with her cardiologist again next week to see what possible treatment is for her.  He still thinks there may be some issue with her diaphragm but she is meeting with the pulmonologist also.  He is doing what he can to help her.  His daughter's birthday is March 11 so he knows that is a difficult time for both he and his wife next week.  Typically they  do something to remember her but because of the wife's current health issues they cannot go to [place] as they had planned so they are talking about what to do to remember and celebrate her birthday.  We will continue to work on coping skills for helping with his anxiety and the progression of his Lewy body dementia diagnosis as well as processing his grief. Mental Status Exam: Appearance:  Well Groomed  Behavior: Appropria te Motor: Pt. Reports some instability due to Lewey Body dementia Speech/Language:  Normal Rate Affect: Appropriate Mood: normal Thought process: normal Thought content:  WNL Sensory/Perceptual disturbances:  WNL Orientation: oriented to person, place, and  situation Attention: Good Concentration: Good Memory: Impaired short term Fund of knowledge:  Good Insight:  Good Judgment:  Good Impulse Control: Good  Risk Asses sment: Danger to Self: No Self-injurious Behavior: No Danger to Others: No Duty to Warn:no Physical Aggression / Violence:No  Access to Firearms a concern: No  Gang Involvement:No  Treatment plan: Will employ cognitive behavioral therapy as well as grief and loss therapy for relief of anxiety and grief.  Goals of grief therapy or to have a healthier response to the loss of his daughter and less sadness as evidenced by patient r eport in therapy notes as well as to help him feel less overwhelmed by her loss.  Goals are to see a 50% reduction in grief symptoms over the next 6 months.  I will encourage the use of the patient telling his grief story about his daughter including encouraging him to bring pictures and memorabilia to help process his feelings including any feelings of guilt associated with her loss.  We will use cognitive behavioral therapy to help id entify and change anxiety producing thoughts and behavior patterns so as to improve his ability to better manage anxiety and stress, and manage thoughts and worrisome thinking contributing to anxiety.  We will also refresh and encourage dialectical behavior therapy distress tolerance and mindfulness  skills with the intention of reducing anxiety by 50% over the next 6 months. Progress: 30% Interventions: Cognitive Behavioral Therapy   Diagnosis:PTSD, Bi-Polar D/O  Plan: F/U/ appointments scheduled weekly.  Cecile Coder, [facility]                                                                                                                   Cecile Coder, [facility]  [facility] Counselor/Therapist Progress Note  Patient ID: Cezar Misiaszek, MRN: [id number],    Date: 04/01/2024 This session was held via video teletherapy. The patient consented to  the video teletherapy and was located in his home during this session. He is aware it is the responsibility of the patient to secure confidentiality on his end of the session. The provider was in a private home office for the duration of this session.   The patient arrived on time for her Caregility session  Time Spent: 57 minutes, 2 PM to 2:57 PM  Treatment Type: Individual Therapy Reported Symptoms: anxiety, panic attacks That  was a situation earlier in the week when the patient's neighbor was banging on his back door and window.  There had been situations where the same neighbor had said things and use  profanity directed at his wife feeling that the patient and his wife and let their dog use the bathroom in his yard.  The patient said they are very careful because her dog is old and did not think that happen but did not appreciate how they were handled thin gs.  He said he got angry very quickly and did not handle it the way that he wanted to.  The situation has been resolved but the patient has had very clear boundaries with his neighbor. His wife continues to progress well from her surgery.  Physically the patient is doing fairly well.  He had to see one of his brothers this week.  He is spending time with his son and son's family.  He has gotten some retirement/insurance things works o ut for the most part and is glad to have that settled. We will continue to work on coping skills for helping with his anxiety and the progression of his Lewy body dementia diagnosis as well as processing his grief. Mental Status Exam: Appearance:  Well Groomed  Behavior: Appropriate Motor: Pt. Reports some instability due to Lewey Body dementia Speech/Language:  Normal Rate Affect: Appropriate Mood: normal Thought process: no rmal Thought content:  WNL Sensory/Perceptual disturbances:  WNL Orientation: oriented to person, place, and situation Attention: Good Concentration: Good Memory: Impaired short term Fund of knowledge:  Good Insight:  Good Judgment:  Good Impulse Control: Good  Risk Assessment: Danger to Self: No Self-injurious Behavior: No Danger to Others: No Duty to Warn:no Physical Aggression / Violence:No  Access to Firearms a  concern: No  Gang Involvement:No  Treatment plan: Will employ cognitive behavioral therapy as well as grief and loss therapy for relief of anxiety and grief.  Goals of grief therapy or to have a healthier response to the loss of his daughter and less sadness as evidenced by patient report in therapy notes as well as to help him feel less overwhelmed by her  loss.  Goals are to see a 50% reduction in grief symptoms over the next 6 mont hs.  I will encourage the use of the patient telling his grief story about his daughter including encouraging him to bring pictures and memorabilia to help process his feelings including any feelings of guilt associated with her loss.  We will use cognitive behavioral therapy to help identify and change anxiety producing thoughts and behavior patterns so as to improve his ability to better manage anxiety and stress, and manage thoughts  and worrisome thinking contributing to anxiety.  We will also refresh and encourage dialectical behavior therapy distress tolerance and mindfulness skills with the intention of reducing anxiety by 50% over  the next 6 months. Progress: 30% Interventions: Cognitive Behavioral Therapy  Diagnosis:PTSD, Bi-Polar D/O  Plan: F/U/ appointments scheduled weekly.  Cecile Coder, [facility]                                                                                                                    Cecile Coder, [facility]               Cecile Coder, [facility]               Cecile Coder, [facility]               Cecile Coder, [facility]               Cecile Coder, [facility]               [address] [place], [facility]               Cecile Coder, [facility]               Cecile Coder, [facility]               Cecile Coder, [facility]               Cecile Coder, [facility]  [facility] Counselor/Therapist Progress Note  Patient ID: Indiana Pechacek, MRN: [id number],    Date: 04/01/2024 This session was held via video teletherapy. The patient consented to the video teletherapy and was located in his home during this session. He is aware it is the responsibility of the patient to secure confidentia lity on his end of the session. The provider was in a private home office for the duration of this session.   The patient arrived on time for her Caregility session  Time Spent: 59 minutes, 2 PM to 2:59 PM  Treatment Type: Individual Therapy Reported Symptoms: anxiety, panic attacks The patient said he jokingly said to his wife that he had not had any pain issues in the cervical area and then a few days ago he started having pai n in his neck and shoulders.  He now sits related to degenerative disk and has some consistent pain somewhere in his spine with that but jokingly says it just depends on Wednesday which  part of his body is decides to show up and hurt.  He is doing the best that he can.  He is still walking which he knows is good for him but does not have right now the strength to do as much in the morning as he has done in the past so he is denying that  to 3 times a day for consistency.  He is still doing some positive things and that he is staying in touch with his brother and other family members.  He is reconnecting with friends from high school and he is which she is enjoying.  One of them has had a similar experience in that she lost her son in the same way the patient lost his daughter.  He said they did not think and Easter in which they set a place for his daughter at the tabl e and put her picture there.  He initially had reservations about doing that he and his wife had made everyone feel as if she was really there with him.  He still feels that he is grieving in a healthy way.  He and his wife have always taken a trip around both of their birthdays which are 2 days apart.  That is now close to his daughter died so they have started taking some of her rashes with him and sprinkling them where they go as a w ay of remembrance.  They are deciding what that looks like this year in July.  He does contract for safety having no thoughts of hurting himself or anyone else. We will continue to work on coping skills for helping with his anxiety and the progression of his Lewy body dementia diagnosis as well as processing his grief. Mental Status Exam: Appearance:  Well Groomed  Behavior: Appropriate Motor: Pt. Reports some instability due Stephen Myers o Lewey Body dementia Speech/Language:  Normal Rate Affect: Appropriate Mood: normal Thought process: normal Thought content:  WNL Sensory/Perceptual disturbances:  WNL Orientation: oriented to person, place, and situation Attention: Good Concentration: Good Memory: Impaired short term Fund of knowledge:  Good Insight:  Good Judgment:  Good Impulse  Control: Good  Risk Assessment: Danger to Self: No Self-injurious B ehavior: No Danger to Others: No Duty to Warn:no Physical Aggression / Violence:No  Access to Firearms a concern: No  Gang Involvement:No  Treatment plan: Will employ cognitive behavioral therapy as well as grief and loss therapy for relief of anxiety and grief.  Goals of grief therapy  or to have a healthier response to the loss of his daughter and less sadness as evidenced by patient report in therapy notes as well as to help him  feel less overwhelmed by her loss.  Goals are to see a 50% reduction in grief symptoms over the next 6 months.  I will encourage the use of the patient telling his grief story about his daughter including encouraging him to bring pictures and memorabilia to help process his feelings including any feelings of guilt associated with her loss.  We will use cognitive behavioral therapy to help identify and change anxiety producing thoughts  and behavior patterns so as to improve his ability to better manage anxiety and stress, and manage thoughts and worrisome thinking contributing to anxiety.  We will also refresh and encourage dialectical behavior therapy distress tolerance and mindfulness skills with the intention of reducing anxiety by 50% over the next 6 months. Progress: 30% Interventions: Cognitive Behavioral Therapy  Diagnosis:PTSD, Bi-Polar D/O  Plan: F/U/ a ppointments scheduled weekly.  Cecile Coder, [facility]                                                                                                                   Cecile Coder, [facility]  [facility] Counselor/Therapist Progress Note  Patient ID: Jarick Harkins, MRN: [id number],    Date: 04/01/2024 This session was held via video teletherapy. The patient consented to the video teletherapy and was located in his  home during this session. He is aware it is the responsibility of the patient to secure confidentiality on his end of the session. The provider was in a private home office for the duration of this session.   The patient arrived on time for her Caregility session  Time Spent: 58 minutes, 2 PM to 2:58 PM  Treatment Type: Individual Therapy Reported Symptoms: anxiety, panic attacks A fairly difficult week because it was his daught er's birthday.  He said this year was more difficult than last year but he could not pinpoint why.  They did remember her about going out to a nice restaurant which is something her  daughter always wanted to do.  He said they were able to laugh and joke about funny things that his daughter had said or done which is not something they were able to do this time last year so he saw that as healthy grieving.  He knows that the time change Stephen Myers hrew things off a little bit for everybody and his wife still is not feeling well and his back has been bothering him.  He was not able to go to his first round of physical therapy this week because his back was bothering him more.  He has been thinking a lot more about his daughter which he feels comfortable with.  He is thankful that it is getting warmer and staying light longer so he can get back to some of the coping skills such as  fishing that he has done in the past.   We will continue to work on coping skills for helping with his anxiety and the progression of his Lewy body dementia diagnosis as well as processing his grief. Mental Status Exam: Appearance:  Well Groomed  Behavior: Appropriate Motor: Pt. Reports some instability due to Lewey Body dementia Speech/Language:  Normal Rate Affect: Appropriate Mood: normal Thought process: normal Thought  content:  WNL Sensory/Perceptual disturbances:  WNL Orientation: oriented to person, place, and situation Attention: Good Concentration: Good Memory: Impaired short term Fund of knowledge:  Good Insight:  Good Judgment:  Good Impulse Control: Good  Risk Assessment: Danger to Self: No Self-injurious Behavior: No Danger to Others: No Duty to Warn:no Physical Aggression / Violence:No  Access to Firearms a concern: No   Gang Involvement:No  Treatment plan: Will employ cognitive behavioral therapy as well as grief and loss therapy for relief of anxiety and grief.  Goals of grief therapy or to have a healthier response to the loss of his daughter and less sadness as evidenced by patient report in therapy notes as well as to help him feel less overwhelmed by her loss.  Goals are  to see a 50% reduction in grief symptoms over the next 6 months.  I will e ncourage the use of the patient telling his grief story about his daughter including encouraging him to bring pictures and memorabilia to help process his feelings including any feelings of guilt associated with her loss.  We will use cognitive behavioral therapy to help identify and change anxiety producing thoughts and behavior patterns so as to improve his ability to better manage anxiety and stress, and manage thoughts and worrisome  thinking contributing to anxiety.  We will also refresh and encourage dialectical behavior therapy  distress tolerance and mindfulness skills with the intention of reducing anxiety by 50% over the next 6 months. Progress: 30% Interventions: Cognitive Behavioral Therapy  Diagnosis:PTSD, Bi-Polar D/O  Plan: F/U/ appointments scheduled weekly.  Cecile Coder, [facility]                                                                                                                    Cecile Coder, [facility]               Cecile Coder, [facility]               Cecile Coder, [facility]  [facility] Counselor/Therapist Progress Note  Patient ID: Ozias Dicenzo, MRN: [id number],    Date: 04/01/2024 This session was held via video teletherapy. The patient consented to the video teletherapy and was located in his home during this session. He is aware it  is the responsibility of the patient to secure confidentiality on his end of the session. The provider was in a private home office for the duration of this session.   The patient arrived on time for her Caregility session  Time Spent: 1:05 PM until 2 PM, 55 minutes Treatment Type: Individual Therapy Reported Symptoms: anxiety, panic attacks The patient said he came in from his walk this morning and saw what he thought was some  sort of tic from his wife.  It was not the first time it happened and he wonders if it might be tardive dyskinesia because of some new medications that she is on.  She has a call into the doctor right now and hopes to hear something back soon.  They have narrowed down to the fact they think most of her discomfort in breathing is coming from the diaphragm and they are looking for alternatives for treatment.  The patient continues to do  those things which help him cope including walking, fishing when he can, spending time with his son and daughter-in-law and grandson.  He also has a way of continued coping with grief  he is writing letters to his daughters as a way of expressing his feelings and says that is very beneficial.  He appears to be grieving in a healthy way but he knows that he to year anniversary of her death is coming up and there is some grief there but s ays it is not overwhelming.  We will continue to work on coping skills for helping with his anxiety and the progression of his Lewy body dementia diagnosis as well as processing his grief. Mental Status Exam: Appearance:  Well Groomed  Behavior: Appropriate Motor: Pt. Reports some instability due to Lewey Body dementia Speech/Language:  Normal Rate Affect: Appropriate Mood: normal Thought process: normal Thought content:   WNL Sensory/Perceptual disturbances:  WNL Orientation: oriented to person, place, and situation Attention: Good Concentration: Good Memory: Impaired short term Fund of knowledge:  Good Insight:  Good Judgment:  Good Impulse Control: Good  Risk Assessment: Danger to Self: No Self-injurious Behavior: No Danger to Others: No Duty to Warn:no Physical Aggression / Violence:No  Access to Firearms a concern: No  [place] vement:No  Treatment plan: Will employ cognitive behavioral therapy as well as grief and loss therapy for relief of anxiety and grief.  Goals of grief therapy or to have a healthier response to the loss of his daughter and less sadness as evidenced by patient report in therapy notes as well as to help him feel less overwhelmed by her loss.  Goals are to see a 50% reduction in grief symptoms over the next 6 months.  I will encourage the  use of the patient telling his grief story about his daughter including encouraging him to bring pictures and memorabilia to help process his feelings including any feelings of guilt associated with her loss.  We will use cognitive behavioral therapy to help identify and change anxiety producing thoughts and behavior patterns so as to improve his ability to better  manage anxiety and stress, and manage thoughts and worrisome thinking co ntributing to anxiety.  We will also refresh and encourage dialectical behavior therapy distress tolerance  and mindfulness skills with the intention of reducing anxiety by 50% over the next 6 months. Progress: 30% Interventions: Cognitive Behavioral Therapy  Diagnosis:PTSD, Bi-Polar D/O  Plan: F/U/ appointments scheduled weekly.  Cecile Coder, [facility]                                                                                                                    Cecile Coder, [facility]  [place] [facility] Counselor/Therapist  Progress Note  Patient ID: Devaughn Savant, MRN: [id number],    Date: 04/01/2024 This session was held via video teletherapy. The patient consented to the video teletherapy and was located in his home during this session. He is aware it is the responsibility of the patient to secure confidentiality on his end of the session. Th e provider was in a private home office for the duration of this session.   The patient arrived on time for her Caregility session  Time Spent: 59 minutes, 2 PM to 2:59 PM  Treatment Type: Individual Therapy Reported Symptoms: anxiety, panic attacks The patient continues to present with physical pain and both his back and his knee.  He started with a new physical therapist and told him that there was a lot of arthritis in both k nees and or certain things he cannot do but they had him do that anyway and now for the past 3 days he has been in significant knee pain.  He has continued to walk because he knows that is good for him in various ways but says it has been painful.  His wife also was not feeling well but she is going back to the doctor next week to have an ablation and hopes that will bring her some relief.  For the most part he has been at the last coup le weeks reaching back out and hearing from some old friends as well as time with his son and son's family.  He says that his wife's cousin is coming down for a weekend soon and they enjoy time with her and that he will go to his brother's house sometime in the next few weeks.  He recognizes the need for people that he cares about in helping with his depression. We will continue to work on coping skills for helping with his anxiety and  the progression of his Lewy body dementia diagnosis as well as processing his grief. Mental Status Exam: Appearance:  Well Groomed  Behavior: Appropriate Motor: Pt. Reports some instability due to Lewey Body dementia Speech/Language:  Normal  Rate Affect: Appropriate Mood: normal Thought process: normal Thought content:  WNL Sensory/Perceptual disturbances:  WNL Orientation: oriented to person, place, and situation Atten tion: Good Concentration: Good Memory: Impaired short term Fund of knowledge:  Good Insight:  Good Judgment:  Good Impulse Control: Good  Risk Assessment: Danger to Self: No Self-injurious Behavior: No Danger to Others: No Duty to Warn:no Physical Aggression / Violence:No  Access to Firearms a concern: No  Gang Involvement:No  Treatment plan: Will employ cognitive behavioral therapy as well as grief and loss therapy fo r relief of anxiety and grief.  Goals of grief therapy or to have a healthier response to the loss of his daughter and less sadness as evidenced by patient report in therapy notes as well as to help him feel less overwhelmed by her loss.  Goals are to see a 50% reduction in grief symptoms over the next 6 months.  I will encourage the use of the patient telling his grief story about his daughter including encouraging him to bring picture s and memorabilia to help process his feelings including any feelings of guilt associated with her loss.  We will use cognitive behavioral therapy to help identify and change anxiety producing thoughts and behavior patterns so as to improve his ability to better manage anxiety and stress, and manage thoughts and worrisome thinking contributing to anxiety.  We will also  refresh and encourage dialectical behavior therapy distress toleranc e and mindfulness skills with the intention of reducing anxiety by 50% over the next 6 months. Progress: 30% Interventions: Cognitive Behavioral Therapy  Diagnosis:PTSD, Bi-Polar D/O  Plan: F/U/ appointments scheduled weekly.  Cecile Coder,  [facility]                                                                                                                    Cecile Coder, [facility]               Cecile Coder, [facility]               Cecile Coder, [facility]               Cecile Coder, [facility]  [place] [facility] Counselor/Therapist Progress Note  Patient ID: Aurel Nguyen, MRN: [id number],    Date: 04/01/2024 This session was held via video teletherapy. The patient consented to the video teletherapy and was located in his home during this session. He is aware it is the responsibility of the patient to secure  confide ntiality on his end of the session. The provider was in a private home office for the duration of this session.   The patient arrived on time for her Caregility session  Time Spent: 57 minutes, 2 PM to 2:57 PM  Treatment Type: Individual Therapy Reported Symptoms: anxiety, panic attacks The patient is still having significant back pain.  He cannot sit anywhere for very long and has to be careful where he sits.  There is still so me unsteadiness although he did not report any falls over the past 2 weeks.  His biggest concern now is for his wife.  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  She is meeting with her cardiologist again next week to see what possible treatment is for her.  He still thinks there may be some issue with her diaphragm but she is meeting with the pulmonologis Stephen Myers also.  He is doing what he can to help her.  His daughter's birthday is March 11 so he knows that is a difficult time for both he and his wife next week.  Typically they do something to remember her but because of the wife's current health issues they cannot go to [place] as they had planned so they are talking about what to do to remember and celebrate her birthday.  We will continue to work on coping skills for helping wit h his anxiety and the progression of his Lewy body dementia diagnosis as well as processing his grief. Mental Status Exam: Appearance:  Well Groomed  Behavior: Appropriate Motor: Pt. Reports some instability due to Lewey Body dementia Speech/Language:  Normal Rate Affect: Appropriate Mood: normal Thought process: normal Thought content:  WNL Sensory/Perceptual disturbances:  WNL Orientation: oriented to person, place, and  situation Attention: Good Concentration: Good Memory: Impaired short term Fund of knowledge:  Good Insight:  Good Judgment:  Good Impulse Control: Good  Risk  Assessment: Danger to Self: No Self-injurious Behavior: No Danger to Others: No Duty to Warn:no Physical Aggression / Violence:No  Access to Firearms a concern: No  Gang Involvement:No  Treatment plan: Will employ cognitive behavioral therapy as well as grief an d loss therapy for relief of anxiety and grief.  Goals of grief therapy or to have a healthier response to the loss of his daughter and less sadness as evidenced by patient report in therapy notes as well as to help him feel less overwhelmed by her loss.  Goals are to see a 50% reduction in grief symptoms over the next 6 months.  I will encourage the use of the patient telling his grief story about his daughter including encouraging him  to bring pictures and memorabilia to help process his feelings including any feelings of guilt associated with her loss.  We will use cognitive behavioral therapy to help identify and change anxiety producing thoughts and behavior patterns so as to improve his ability to better manage anxiety and stress, and manage thoughts and worrisome thinking contributing to anxiety.  We will also refresh and encourage dialectical behavior therapy  distress tolerance and mindfulness  skills with the intention of reducing anxiety by 50% over the next 6 months. Progress: 30% Interventions: Cognitive Behavioral Therapy  Diagnosis:PTSD, Bi-Polar D/O  Plan: F/U/ appointments scheduled weekly.  Cecile Coder, [facility]                                                                                                                    Cecile Coder,  [facility]  [place] [facility] unselor/Therapist Progress Note  Patient ID: Kimberley Dastrup, MRN: [id number],    Date: 04/01/2024 This session was held via video teletherapy. The patient consented to the video teletherapy and was located in his home during this session. He is aware it is the responsibility of the patient to secure confidentiality on his end of the session. The provider was in a private home office for the duration of this session.   The patie nt arrived on time for her Caregility session  Time Spent: 58 minutes, 2 PM to 2:58 PM  Treatment Type: Individual Therapy Reported Symptoms: anxiety, panic attacks A fairly difficult week because it was his daughter's birthday.  He said this year was more  difficult than last year but he could not pinpoint why.  They did remember her about going out to a nice restaurant which is something her daughter always wanted to do.  He said  they were able to laugh and joke about funny things that his daughter had said or done which is not something they were able to do this time last year so he saw that as healthy grieving.  He knows that the time change threw things off a little bit for everybody and his wife still is not feeling well and his back has been bothering him.  He was not able to go to his first round of physical therapy this week because his back was bothering  him more.  He has been thinking a lot more about his daughter which he feels comfortable with.  He is thankful that it is getting warmer and staying light longer so he can get back to some of the coping skills such as fishing that he has done in the past.   We will continue to work on coping skills for helping with his anxiety and the progression of his Lewy body dementia diagnosis as well as processing his grief. 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 Speech/Language:  Normal Rate Affect: Appropriate Mood: normal Thought process: normal Thought content:  WNL Sensory/Perceptual disturbances:  WNL Orientation: oriented to person, place, and situation Attention: Good Concentration: Good Memory: Impaired short term Fund of knowledge:  Good Insight:  Good  Judgment:  Good Impulse Control: Good  Risk Assessment: Danger to Self: No Self-injurious Behavior: No Danger to Others: No Duty to Warn:no Physical Aggression / Violence:No  Access to Firearms a concern: No  Gang Involvement:No  Treatment plan: Will employ cognitive behavioral therapy as well as grief and loss therapy for relief of anxiety and grief.  Goals of grief therapy or to have a healthier response to the loss of hi s  daughter and less sadness as evidenced by patient report in therapy notes as well as to help him feel less overwhelmed by her loss.  Goals are to see a 50% reduction in grief symptoms over the next 6 months.  I will encourage the use of the patient telling his grief story about his daughter including encouraging him to bring pictures and memorabilia to help process his feelings including any feelings of guilt associated with her loss.   We will use cognitive behavioral therapy to help identify and change anxiety producing thoughts and behavior patterns so as to improve his ability to better manage anxiety and stress, and manage thoughts and worrisome thinking contributing to anxiety.  We will also refresh and encourage dialectical behavior therapy  distress tolerance and mindfulness skills with the intention of reducing anxiety by 50% over the next 6 months. Progress : 30% Interventions: Cognitive Behavioral Therapy  Diagnosis:PTSD, Bi-Polar D/O  Plan: F/U/ appointments scheduled weekly.  Cecile Coder, [facility]                                                                                                                   Cecile Coder, [facility]               Cecile Coder, [facility]               Cecile Coder, [facility]  [place] [facility] Counselor/Therapist Progress No te  Patient ID: Herson Prichard, MRN: [id number],    Date: 04/01/2024 This session was held via video teletherapy. The patient consented to the video teletherapy and was located in his home during this session. He is aware it is the responsibility of the patient to secure confidentiality on his end of the session. The provider was in a private home office for the duration of this session.   The patient arrived on time for her Ca regility session  Time Spent: 57 minutes, 2 PM to 2:57 PM  Treatment Type: Individual Therapy Reported Symptoms: anxiety, panic attacks The patient is still having significant back pain.  He cannot sit anywhere for very long and has to be careful where he sits.  There is still some unsteadiness although he did not report any falls over the past 2 weeks.  His biggest concern now is for his wife.  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  She is meeting with her cardiologist again next week to see what possible treatment is for her.   He still thinks there may be some issue with her diaphragm but she is meeting with the pulmonologist also.  He is doing what he can to help her.  His daughter's birthday is March 11 so he knows that is a difficult time for both he and his wife next wee k.  Typically they do something to remember her but because of the wife's current health issues they cannot go to [place] as they had planned so they are talking about what to do to remember and celebrate her birthday.  We will continue to work on coping skills for helping with his anxiety and the progression of his Lewy body dementia diagnosis as well as processing his grief. 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 Speech/Language:  Normal Rate Affect: Appropriate Mood: normal Thought process: normal Thought content:  WNL Sensory/Perceptual disturbances:  WNL Orientation: oriented to person, place, and situation Attention: Good Concentration: Good Memory: Impaired short term Fund of knowledge:  Good Insight:  Good Judgment:  Good Impulse Control:  Good  Risk Assessment: Danger to Self: No Self-injurious Behavior: No Danger to Others: No Duty to Warn:no Physical Aggression / Violence:No  Access to Firearms a concern: No  Gang Involvement:No  Treatment plan: Will employ cognitive behavioral therapy as well as grief and loss therapy for relief of anxiety and grief.  Goals of grief therapy or to have a healthier response to the loss of his daughter and less sadness as evi denced by patient report in therapy notes as well as to help him feel less overwhelmed by her loss.  Goals are to see a 50% reduction in grief symptoms over the next 6 months.  I will encourage the use of the patient telling his grief story about his daughter including encouraging him to bring pictures and memorabilia to help process his feelings including any feelings  of guilt associated with her loss.  We will use cognitive behavioral  therapy to help identify and change anxiety producing thoughts and behavior patterns so as to improve his ability to better manage anxiety and stress, and manage thoughts and worrisome thinking contributing to anxiety.  We will also refresh and encourage dialectical behavior therapy distress tolerance and mindfulness  skills with the intention of reducing anxiety by 50% over the next 6 months. Progress: 30% Interventions: Cognitive Be havioral Therapy  Diagnosis:PTSD, Bi-Polar D/O  Plan: F/U/ appointments scheduled weekly.  Cecile Coder, [facility]                                                                                                                   Cecile Coder, [facility]  [place] [facility] Counselor/Therapist Progress Note  Patient ID: Kosei Rhodes, MRN: [id number],    Date: 04/01/2024 This session was held via video teletherapy. The pa tient consented to the video teletherapy and was located in his home during this session. He is aware it is the responsibility of the patient to secure confidentiality on his end of the session. The provider was in a private home office for the duration of this session.   The patient arrived on time for her Caregility session  Time Spent: 57 minutes, 2 PM to 2:57 PM  Treatment Type: Individual Therapy Reported Symptoms: anxiety,  panic attacks  We will continue to work on coping skills for helping with his anxiety and the progression of his Lewy body dementia diagnosis as well as processing his grief. Mental Status Exam: Appearance:  Well Groomed  Behavior: Appropriate Motor: Pt. Reports some instability due to Lewey Body dementia Speech/Language:  Normal Rate Affect: Appropriate Mood: normal Thought process: normal Thought content:  WNL Sensory/P erceptual disturbances:  WNL Orientation: oriented to person, place, and situation Attention: Good Concentration: Good Memory: Impaired short term Fund of knowledge:  Good Insight:  Good Judgment:  Good Impulse Control: Good  Risk Assessment: Danger to Self: No Self-injurious Behavior: No Danger to Others: No Duty to Warn:no Physical Aggression / Violence:No  Access to Firearms a concern: No  Gang Involvement:No  Tr eatment plan: Will employ cognitive behavioral therapy as well as grief and loss therapy for relief of anxiety and grief.  Goals of grief therapy or to have a healthier response to the  loss of his daughter and less sadness as evidenced by patient report in therapy notes as well as to help him feel less overwhelmed by her loss.  Goals are to see a 50% reduction in grief symptoms over the next 6 months.  I will encourage the use of the pa tient telling his grief story about his daughter including encouraging him to bring pictures and memorabilia to help process his feelings including any feelings of guilt associated with her loss.  We will use cognitive behavioral therapy to help identify and change anxiety producing thoughts and behavior patterns so as to improve his ability to better manage anxiety and stress, and manage thoughts and worrisome thinking contributing to  anxiety.  We will also refresh and encourage dialectical behavior therapy distress tolerance and mindfulness skills with the intention of reducing anxiety by 50% over the next 6 months. Progress: 30% Interventions: Cognitive Behavioral Therapy  Diagnosis:PTSD, Bi-Polar D/O  Plan: F/U/ appointments scheduled weekly.  Cecile Coder, [facility]                                                                                                                    Cecile Coder, [facility]               Cecile Coder, [facility]               Cecile Coder, [facility]  Cecile Coder, [facility]               Cecile Coder, [facility]               Cecile Coder, [facility]                Cecile Coder,  [facility]  [place] [facility] Counselor/Therapist Progress Note  Patient ID: Twain Stenseth, MRN:  [id number],    Date: 04/01/2024 This session was held via video teletherapy. The patient consented to the video teletherapy and was located in his home during this session. He is aware it is the responsibility of the patient to secure confidentiality on his end of the session. The provider was in a private home office for the duration of this session.   The patient arrived on time for her Caregility session  Time Spent: 39 minute s, 2 PM to 2:39 PM  Treatment Type: Individual Therapy Reported Symptoms: anxiety, panic attacks The patient was not feeling well today.  He started feeling really earlier in the week having a fever  bad headaches body aches.  He felt a little better yesterday but feels a little bit of a resurgence in headaches and body aches and is running a low-grade fever.  His wife is feeling some of the same symptoms.  For the most part he says h e is doing well.  They did increase his memory medication because they were starting to see some increasing decline in short-term memory and he said until recently his long-term memory had been good but he was starting to see a difference in how he used to markers to remember what happened at certain times historically.  He feels the medication has helped and has brightened his mood a little also.  He is still trying to walk every day a nd fish when he can and has enjoyed watching the [employer] as a good distraction.  He reports that he feels he is managing mood fairly well.  He has an appointment in 2 weeks.  He does contract for safety having no thoughts of hurting himself or anyone else. We will continue to work on coping skills for helping with his anxiety and the progression of his Lewy body dementia diagnosis as well as processing his grief. Men tal Status Exam: Appearance:  Well Groomed  Behavior: Appropriate Motor: Pt. Reports some instability due to Lewey Body dementia Speech/Language:  Normal Rate Affect: Appropriate Mood: normal Thought process: normal Thought content:  WNL Sensory/Perceptual disturbances:  WNL Orientation: oriented to person, place, and situation Attention: Good Concentration: Good Memory: Impaired short term Fund of knowledge:  Good In sight:  Good Judgment:  Good Impulse Control: Good  Risk Assessment: Danger to Self: No Self-injurious Behavior: No Danger to Others: No Duty to Warn:no Physical Aggression / Violence:No  Access to Firearms a concern: No  Gang Involvement:No  Treatment plan: Will employ cognitive behavioral therapy as well as grief and loss therapy for relief of anxiety and grief.  Goals  of grief therapy or to have a healthier response to  the loss of his daughter and less sadness as evidenced by patient report in therapy notes as well as to help him feel less overwhelmed by her loss.  Goals are to see a 50% reduction in grief symptoms over the next 6 months.  I will encourage the use of the patient telling his grief story about his daughter including encouraging him to bring pictures and memorabilia to help process his feelings including any feelings of guilt associated  with her loss.  We will use cognitive behavioral therapy to help identify and change anxiety producing thoughts and behavior patterns so as to improve his ability to better manage anxiety and stress, and manage thoughts and worrisome thinking contributing to anxiety.  We will also refresh  and encourage dialectical behavior therapy distress tolerance and mindfulness skills with the intention of reducing anxiety by 50% over the next 6 mon ths. Progress: 30% Interventions: Cognitive Behavioral Therapy  Diagnosis:PTSD, Bi-Polar D/O  Plan: F/U/ appointments scheduled weekly.  Cecile Coder, [facility]                                                                                                                   Cecile Coder, [facility]  [place] [facility] Counselor/Therapist Progress Note  Patient ID: Nell Schrack, MRN: [id number],    Date: 04/01/2024 Thi s session was held via video teletherapy. The patient consented to the video teletherapy and was located in his home during this session. He is aware it is the responsibility of the patient to secure confidentiality on his end of the session. The provider was in a private home office for the duration of this session.   The patient arrived on time for her Caregility session  Time Spent: 58 minutes, 2 PM to 2:58 PM  Treatment Type:  Individual Therapy Reported Symptoms: anxiety, panic attacks A fairly difficult week because it was his daughter's birthday.  He said this year was more difficult than last year but he could not pinpoint why.  They did remember her about going out to a nice restaurant which is something her daughter always wanted to do.  He said they were able to laugh and joke about funny things that his daughter had said or done which is not somethi ng they were able to do this time last year so he saw that as healthy grieving.  He knows that the time change threw things off a little bit for everybody and his wife still is not  feeling well and his back has been bothering him.  He was not able to go to his first round of physical therapy this week because his back was bothering him more.  He has been thinking a lot more about his daughter which he feels comfortable with.  He is than kful that it is getting warmer and staying light longer so he can get back to some of the coping skills such as fishing that he has done in the past.   We will continue to work on coping skills for helping with his anxiety and the progression of his Lewy body dementia diagnosis as well as processing his grief. Mental Status Exam: Appearance:  Well Groomed  Behavior: Appropriate Motor: Pt. Reports some instability due to Lewey B ody dementia Speech/Language:  Normal Rate Affect: Appropriate Mood: normal Thought process: normal Thought content:  WNL Sensory/Perceptual disturbances:  WNL Orientation: oriented to person, place, and situation Attention: Good Concentration: Good Memory: Impaired short term Fund of knowledge:  Good Insight:  Good Judgment:  Good Impulse Control: Good  Risk Assessment: Danger to Self: No Self-injurious Behavior:  No Danger to Others: No Duty to Warn:no Physical Aggression / Violence:No  Access to Firearms a concern: No  Gang Involvement:No  Treatment plan: Will employ cognitive behavioral therapy as well as grief and loss therapy for relief of anxiety and grief.  Goals of grief therapy or to have a healthier response to the loss of his daughter and less sadness as evidenced by patient report in therapy notes as well as to help him feel les s overwhelmed by her loss.  Goals are to see a 50% reduction in grief symptoms over the next 6 months.  I will encourage the use of the patient telling his grief story about his daughter including encouraging him to bring pictures and memorabilia to help process his feelings including any feelings of guilt associated with her loss.  We will use cognitive  behavioral therapy to help identify and change anxiety producing thoughts and behav ior patterns so as to improve his ability to better manage anxiety and stress, and manage thoughts and worrisome thinking contributing to anxiety.  We will also refresh and encourage dialectical behavior therapy  distress tolerance and mindfulness skills with the intention of reducing anxiety by 50% over the next 6 months. Progress: 30% Interventions: Cognitive Behavioral Therapy  Diagnosis:PTSD, Bi-Polar D/O  Plan: F/U/ appointmen ts scheduled weekly.  Cecile Coder, [facility]                                                                                                                   Cecile Coder, [facility]               Cecile Coder, [facility]               Cecile Coder, [facility]  [place] [facility] Counselor/Therapist Progress Note  Patient ID: Isais Klipfel, MRN: [id number],    Date: 04/01/2024 This session was held via video teletherapy.  The patient consented to the video teletherapy and was located in his home during this session. He is aware it is the responsibility of the patient to secure confidentiality on his end of the session. The provider was in a private home office for the duration of this session.   The patient arrived on time for her Caregility session  Time Spent: 1:05 PM until 2 PM, 55 minutes Treatment Type: Individual Therapy Reported Symptoms:  anxiety, panic attacks The patient said he came in from his walk this morning and saw what he thought was some sort of tic from his wife.  It was not the first time it happened and he wonders if it might be tardive dyskinesia because of some new medications that she is on.  She has a call into the doctor right now and hopes to hear something back soon.  They have narrowed down to the fact they think most of her discomfort in breathing  is coming from the diaphragm and they are looking for alternatives for treatment.  The patient continues to do those things which help him cope including walking, fishing when he can, spending time with his son and daughter-in-law and grandson.  He also has a way of continued coping with grief he is writing letters to his daughters as a way of expressing his feelings and says that is very beneficial.  He appears to be grieving in a he althy way but he knows that he to year anniversary of her death is coming up and there is some grief there but says it is not overwhelming.  We will continue to work on coping skills  for helping with his anxiety and the progression of his Lewy body dementia diagnosis as well as processing his grief. Mental Status Exam: Appearance:  Well Groomed  Behavior: Appropriate Motor: Pt. Reports some instability due to Lewey Body dementia  Speech/Language:  Normal Rate Affect: Appropriate Mood: normal Thought process: normal Thought content:  WNL Sensory/Perceptual disturbances:  WNL Orientation: oriented to person, place, and situation Attention: Good Concentration: Good Memory: Impaired short term Fund of knowledge:  Good Insight:  Good Judgment:  Good Impulse Control: Good  Risk Assessment: Danger to Self: No Self-injurious Behavior: No Danger Stephen Myers o Others: No Duty to Warn:no Physical Aggression / Violence:No  Access to Firearms a concern: No  Gang Involvement:No  Treatment plan: Will employ cognitive behavioral therapy as well as grief and loss therapy for relief of anxiety and grief.  Goals of grief therapy or to have a healthier response to the loss of his daughter and less sadness as evidenced by patient report in therapy notes as well as to help him feel less overwhelme d by her loss.  Goals are to see a 50% reduction in grief symptoms over the next 6 months.  I will encourage the use of the patient telling his grief story about his daughter including encouraging him to bring pictures and memorabilia to help process his feelings including any feelings of guilt associated with her loss.  We will use cognitive behavioral therapy to help identify and change anxiety producing thoughts and behavior patterns  so as to improve his ability to better manage anxiety and stress, and manage thoughts and worrisome thinking contributing to anxiety.  We will also refresh and encourage dialectical behavior therapy distress tolerance and  mindfulness skills with the intention of reducing anxiety by 50% over the next 6 months. Progress: 30% Interventions: Cognitive Behavioral  Therapy  Diagnosis:PTSD, Bi-Polar D/O  Plan: F/U/ appointments scheduled  weekly.  Cecile Coder, [facility]                                                                                                                   Cecile Coder, [facility]   Behavioral Health Counselor/Therapist Progress Note  Patient ID: Candido Flott, MRN: [id number],    Date: 04/01/2024 This session was held via video teletherapy. The patient consented to the video teletherapy and was located in his home during this sess ion. He is aware it is the responsibility of the patient to secure confidentiality on his end of the  session. The provider was in a private home office for the duration of this session.   The patient arrived on time for her Caregility session  Time Spent: 59 minutes, 2 PM to 2:59 PM  Treatment Type: Individual Therapy Reported Symptoms: anxiety, panic attacks The patient continues to present with physical pain and both his back  and his knee.  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  He has continued to walk because he knows that is good for him in various ways but says it has been painful.  His wife also was not feeling well but she is going back to the doctor next wee k to have an ablation and hopes that will bring her some relief.  For the most part he has been at the last couple weeks reaching back out and hearing from some old friends as well as time with his son and son's family.  He says that his wife's cousin is coming down for a weekend soon and they enjoy time with her and that he will go to his brother's house sometime in the next few weeks.  He recognizes the need for people that he cares a bout in helping with his depression. We will continue to work on coping skills for helping with his anxiety and the progression of his Lewy body dementia diagnosis as well as processing his grief. Mental Status Exam: Appearance:  Well Groomed  Behavior: Appropriate Motor: Pt. Reports some instability due to Lewey Body dementia Speech/Language:  Normal Rate Affect: Appropriate Mood: normal Thought process: normal Thought cont ent:  WNL Sensory/Perceptual disturbances:  WNL Orientation: oriented to person, place, and situation Attention: Good Concentration: Good Memory: Impaired short term Fund of knowledge:  Good Insight:  Good Judgment:  Good Impulse Control: Good  Risk Assessment: Danger to  Self: No Self-injurious Behavior: No Danger to Others: No Duty to Warn:no Physical Aggression / Violence:No  Access to Firearms a concern: No  Gan g Involvement:No  Treatment plan: Will employ cognitive behavioral therapy as well as grief and loss therapy for relief of anxiety and grief.  Goals of grief therapy or to have a healthier response to the loss of his daughter and less sadness as evidenced by patient report in therapy notes as well as to help him feel less overwhelmed by her loss.  Goals are to see a 50% reduction in grief symptoms over the next 6 months.  I will encour age the use of the patient telling his grief story about his daughter including encouraging him to bring pictures and memorabilia to help process his feelings including any feelings of guilt associated with her loss.  We will use cognitive behavioral therapy to help identify and change anxiety producing thoughts and behavior patterns so as to improve his ability to better manage anxiety and stress, and manage thoughts and worrisome thin king contributing to anxiety.  We will  also refresh and encourage dialectical behavior therapy distress tolerance and mindfulness skills with the intention of reducing anxiety by 50% over the next 6 months. Progress: 30% Interventions: Cognitive Behavioral Therapy  Diagnosis:PTSD, Bi-Polar D/O  Plan: F/U/ appointments scheduled weekly.  Cecile Coder, [facility]                                                                                                                    Cecile Coder, [facility]               Cecile Coder, [facility]               Cecile Coder, [facility]               Cecile Coder, [facility]   Behavioral Health Counselor/Therapist Progress Note  Patient ID: Glennie Rodda, MRN: [id number],    Date: 04/01/2024 This session was held via video teletherapy. The patient consented to the video teletherapy and wa s located in his home during this session. He is aware it is the responsibility of the patient to secure confidentiality on his end of the session. The provider was in a private home office for the duration of this session.   The patient arrived on time for her Caregility session  Time Spent: 57 minutes, 2 PM to 2:57 PM  Treatment Type: Individual Therapy Reported Symptoms: anxiety, panic attacks The patient is still having sign ificant back pain.  He cannot sit anywhere for very long and has to be careful where he sits.  There is still some unsteadiness although he did not report any falls over the past 2  weeks.  His biggest concern now is for his wife.  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  She is meeting with her cardiologist again next week to see what possible treatment  is for her.  He still thinks there may be some issue with her diaphragm but she is meeting with the pulmonologist also.  He is doing what he can to help her.  His daughter's birthday is March 11 so he knows that is a difficult time for both he and his wife next week.  Typically they do something to remember her but because of the wife's current health issues they cannot go to [place] as they had planned so they are talking about  what to do to remember and celebrate her birthday.  We will continue to work on coping skills for helping with his anxiety and the progression of his Lewy body dementia diagnosis as well as processing his grief. Mental Status Exam: Appearance:  Well Groomed  Behavior: Appropriate Motor: Pt. Reports some instability due to Lewey Body dementia Speech/Language:  Normal Rate Affect: Appropriate Mood: normal Thought process: nor mal Thought content:  WNL Sensory/Perceptual disturbances:  WNL Orientation: oriented to person, place, and situation Attention: Good Concentration: Good Memory: Impaired short term Fund of knowledge:  Good Insight:  Good Judgment:  Good Impulse Control: Good  Risk Assessment: Danger to Self: No Self-injurious Behavior: No Danger to Others: No Duty to Warn:no Physical Aggression / Violence:No  Access to Firearms a  concern: No  Gang Involvement:No  Treatment plan: Will employ cognitive behavioral therapy as well as grief and loss therapy for relief of anxiety and grief.  Goals of grief therapy or to have a healthier response to the loss of his daughter and less sadness as evidenced by patient report in therapy notes as well as to help him feel less overwhelmed by her loss.   Goals are to see a 50% reduction in grief symptoms over the next 6 month s.  I will encourage the use of the patient telling his grief story about his daughter including encouraging him to bring pictures and memorabilia to help process his feelings including any feelings of guilt associated with her loss.  We will use cognitive behavioral therapy to help identify and change anxiety producing thoughts and behavior patterns so as to improve his ability to better manage anxiety and stress, and manage thoughts a nd worrisome thinking contributing to anxiety.  We will also refresh and encourage dialectical behavior therapy distress tolerance and mindfulness  skills with the intention of reducing anxiety by 50% over the next 6 months. Progress: 30% Interventions: Cognitive Behavioral Therapy  Diagnosis:PTSD, Bi-Polar D/O  Plan: F/U/ appointments scheduled weekly.  Cecile Coder, [facility]                                                                                                                    Cecile Coder, [facility]  [place] [facility] Counselor/Therapist Progress Note  Patient ID: Governor Matos, MRN: [id number],    Date: 04/01/2024 This session was held via video teletherapy. The patient consented to the video teletherapy and was located in his home during this session. He is aware it is the responsibility of the patient to secure confidentiality on hi s end of the session. The provider was in a private home office for the duration of this session.   The patient arrived on time for her Caregility session  Time Spent: 58 minutes, 2 PM to 2:58 PM  Treatment Type: Individual Therapy Reported Symptoms: anxiety, panic attacks A fairly difficult week because it was his daughter's birthday.  He said this year was more difficult than last year but he could not pinpoint why.  They did  remember her about going out to a nice restaurant which is something her daughter always wanted to do.  He said they were able to laugh and joke about funny things that his daughter had said or done which is not something they were able to do this time last year so he saw that as healthy grieving.  He knows that the time change threw things off a little bit for everybody and his wife still is not feeling well and his back has been bothe ring him.  He was not able to go to his first round of physical therapy this week because his back was bothering him more.  He has been thinking a lot more about his daughter which he  feels comfortable with.  He is thankful that it is getting warmer and staying light longer so he can get back to some of the coping skills such as fishing that he has done in the past.   We will continue to work on coping skills for helping with his anx iety and the progression of his Lewy body dementia diagnosis as well as processing his grief. Mental Status Exam: Appearance:  Well Groomed  Behavior: Appropriate Motor: Pt. Reports some instability due to Lewey Body dementia Speech/Language:  Normal Rate Affect: Appropriate Mood: normal Thought process: normal Thought content:  WNL Sensory/Perceptual disturbances:  WNL Orientation: oriented to person, place, and situatio n Attention: Good Concentration: Good Memory: Impaired short term Fund of knowledge:  Good Insight:  Good Judgment:  Good Impulse Control: Good  Risk Assessment: Danger to Self: No Self-injurious Behavior: No Danger to Others: No Duty to Warn:no Physical Aggression / Violence:No  Access to Firearms a concern: No  Gang Involvement:No  Treatment plan: Will employ cognitive behavioral therapy as well as grief and loss th erapy for relief of anxiety and grief.  Goals of grief therapy or to have a healthier response to the loss of his daughter and less sadness as evidenced by patient report in therapy notes as well as to help him feel less overwhelmed by her loss.  Goals are to see a 50% reduction in grief symptoms over the next 6 months.  I will encourage the use of the patient telling his grief story about his daughter including encouraging him to bring  pictures and memorabilia to help process his feelings including any feelings of guilt associated with her loss.  We will use cognitive behavioral therapy to help identify and change anxiety producing thoughts and behavior patterns so as to improve his ability to better manage anxiety and stress, and manage thoughts and worrisome thinking contributing to anxiety.  We  will also refresh and encourage dialectical behavior therapy distress  tolerance and mindfulness skills with the intention of reducing anxiety by 50% over the next 6 months. Progress: 30% Interventions: Cognitive Behavioral Therapy  Diagnosis:PTSD, Bi-Polar D/O  Plan: F/U/ appointments scheduled weekly.  Cecile Coder, [facility]                                                                                                                    Cecile Coder, [facility]               Cecile Coder, [facility]               Cecile Coder, [facility]  [place] [facility] Counselor/Therapist Progress  Note  Patient ID: Awais Cobarrubias, MRN: [id number],    Date: 04/01/2024 This session was held via video teletherapy. The patient consented to the video teletherapy and was located in his home during this session. He is aware it is the responsibility of the patient to secure confidentiality on his end of the session. The pro vider was in a private home office for the duration of this session.   The patient arrived on time for her Caregility session  Time Spent: 57 minutes, 2 PM to 2:57 PM  Treatment Type: Individual Therapy Reported Symptoms: anxiety, panic attacks The patient is still having significant back pain.  He cannot sit anywhere for very long and has to be careful where he sits.  There is still some unsteadiness although he did not report  any falls over the past 2 weeks.  His biggest concern now is for his wife.  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  She is meeting with her cardiologist again next week to see what possible treatment is for her.  He still thinks there may be some issue with her diaphragm but she is meeting with the pulmonologist also.  He is doing what he can to help he r.  His daughter's birthday is March 11 so he knows that is a difficult time for both he and his wife next week.  Typically they do something to remember her but because of the wife's current health issues they cannot go to [place] as they had planned so they are talking about what to do to remember and celebrate her birthday.  We will continue to work on coping skills for helping with his anxiety and the progression of his Le wy body dementia diagnosis as well as processing his grief. Mental Status Exam: Appearance:  Well Groomed  Behavior: Appropriate Motor: Pt. Reports some instability due to Lewey Body dementia Speech/Language:  Normal Rate Affect: Appropriate Mood: normal Thought  process: normal Thought content:  WNL Sensory/Perceptual disturbances:  WNL Orientation: oriented to person, place, and situation Attention: Good Concentration:  Good Memory: Impaired short term Fund of knowledge:  Good Insight:  Good Judgment:  Good Impulse Control: Good  Risk Assessment: Danger to Self: No Self-injurious Behavior: No Danger to Others: No Duty to Warn:no Physical Aggression / Violence:No  Access to Firearms a concern: No  Gang Involvement:No  Treatment plan: Will employ cognitive behavioral therapy as well as grief and loss therapy for relief of anxiety and gr ief.  Goals of grief therapy or to have a healthier response to the loss of his daughter and less sadness as evidenced by patient report in therapy notes as well as to help him feel less overwhelmed by her loss.  Goals are to see a 50% reduction in grief symptoms over the next 6 months.  I will encourage the use of the patient telling his grief story about his daughter including encouraging him to bring pictures and memorabilia to help  process his feelings including any feelings of guilt associated with her loss.  We will use cognitive behavioral therapy to help identify and change anxiety producing thoughts and behavior patterns so as to improve his ability to better manage anxiety and stress, and manage thoughts and worrisome thinking contributing to anxiety.  We will also refresh and encourage dialectical behavior therapy distress tolerance and mindfulness skills  w ith the intention of reducing anxiety by 50% over the next 6 months. Progress: 30% Interventions: Cognitive Behavioral Therapy  Diagnosis:PTSD, Bi-Polar D/O  Plan: F/U/ appointments scheduled weekly.  Cecile Coder, [facility]                                                                                                                     Cecile Coder, [facility]  [place] [facility] Counselor/Therapist Progress Note  Patient  ID: Taavi Hoose, MRN: [id number],    Date: 04/01/2024 This session was held via video teletherapy. The patient consented to the video teletherapy and was located in his home during this session. He is aware it is the responsibility of the patient to secure confidentiality on his end of the session. The provider was in a private home office for the duration of this session.   The patient arrived on time for her Caregility sessi on  Time Spent: 57 minutes, 2 PM to 2:57 PM  Treatment Type: Individual Therapy Reported Symptoms: anxiety, panic attacks  The patient reported that he had not been sleeping well  so he did speak with his psychiatrist about adjusting some meds.  Initially they adjusted too much up and he said he felt groggy for the entire next day but they have found a combination now where he feels like he is getting more rested and only feels a l ittle drowsy shortly after waking up..  There have been a couple times where he stumbled and one of the times he felt down but did not get hurt the other times he was able to catch himself.  He is trying to stay as active as he can walking daily and fishing with his wife when he can.  His biggest anxiety now is his wife has an upcoming surgery on her diaphragm in 2 weeks so he knows that will be a fairly significant recovery time but al so sees the difficulties she is having now and wants her to get some relief..  His son and daughter-in-law will be supports for them also.  He continues to speak with friends and family members as encouragement.  He also feels that he is grieving appropriately writing to his daughter on a regular basis about the things that are going on in his and his wife's lives.  We will continue to work on coping skills for helping with his anxiet y and the progression of his Lewy body dementia diagnosis as well as processing his grief. Mental Status Exam: Appearance:  Well Groomed  Behavior: Appropriate Motor: Pt. Reports some instability due to Lewey Body dementia Speech/Language:  Normal Rate Affect: Appropriate Mood: normal Thought process: normal Thought content:  WNL Sensory/Perceptual disturbances:  WNL Orientation: oriented to person, place, and situation  Attention: Good Concentration: Good Memory: Impaired short term Fund of knowledge:  Good Insight:  Good Judgment:  Good Impulse Control: Good  Risk Assessment: Danger to Self: No Self-injurious Behavior: No Danger to Others: No Duty to Warn:no Physical Aggression / Violence:No  Access to Firearms a concern: No  Gang Involvement:No  Treatment plan:  Will employ cognitive behavioral therapy as well as grief and loss thera py for relief of anxiety and grief.  Goals of grief therapy or to have a healthier response to the loss of his daughter and less sadness as evidenced by patient report in therapy notes as well as to help him feel less overwhelmed by her loss.  Goals are to see a 50% reduction in grief symptoms over the next 6 months.  I will encourage the use of the patient telling his grief story about his daughter including encouraging him to bring pi ctures and memorabilia to help process his feelings including any feelings of guilt associated with her loss.  We will use cognitive behavioral therapy to help identify and change anxiety producing thoughts and behavior patterns so as to improve his ability  to better manage anxiety and stress, and manage thoughts and worrisome thinking contributing to anxiety.  We will also refresh and encourage dialectical behavior therapy distress tol erance and mindfulness skills with the intention of reducing anxiety by 50% over the next 6 months. Progress: 30% Interventions: Cognitive Behavioral Therapy  Diagnosis:PTSD, Bi-Polar D/O  Plan: F/U/ appointments scheduled weekly.  Cecile Coder, [facility]                                                                                                                    Cecile Coder, [facility]               Cecile Coder, [facility]               Cecile Coder, [facility]               Cecile Coder, [facility]               Cecile Coder, [facility]               Cecile Coder, [facility]               Cecile Coder, [facility]               Cecile Coder, [facility]  [place] [facility] Counselor/Therapist Progress Note  Patient ID: Rhone Ozaki, MRN: [id number],    Date: 04/01/2024 Stephen Myers his session was held via video teletherapy. The patient consented to the video teletherapy and was located in his home during this session. He is aware it is the responsibility of the patient to secure confidentiality on his end of the session. The provider was in a private home office for the duration of this session.   The patient arrived on time for her Caregility session  Time Spent: 59 minutes, 2 PM to 2:59 PM  Treatment Type : Individual Therapy Reported Symptoms: anxiety, panic attacks The patient said he jokingly said to his wife that he had not had any pain issues in the cervical area and then a few  days ago he started having pain in his neck and shoulders.  He now sits related to degenerative disk and has some consistent pain somewhere in his spine with that but jokingly says it just depends on Wednesday which part of his body is decides to show up an d hurt.  He is doing the best that he can.  He is still walking which he knows is good for him but does not have right now the strength to do as much in the morning as he has done in the past so he is denying that to 3 times a day for consistency.  He is still doing some positive things and that he is staying in touch with his brother and other family members.  He is reconnecting with friends from high school and he is which she is enjo Producer, television/film/video.  One of them has had a similar experience in that she lost her son in the same way the patient lost his daughter.  He said they did not think and Easter in which they set a place for his daughter at the table and put her picture there.  He initially had reservations about doing that he and his wife had made everyone feel as if she was really there with him.  He still feels that he is grieving in a healthy way.  He and his wife hav e always taken a trip around both of their birthdays which are 2 days apart.  That is now close to his daughter died so they have started taking some of her rashes with him and sprinkling them where they go as a way of remembrance.  They are deciding what that looks like this year in July.  He does contract for safety having no thoughts of hurting himself or anyone else. We will continue to work on coping skills for helping with his  anxiety and the progression of his Lewy body dementia diagnosis as well as processing his grief. Mental Status Exam: Appearance:  Well Groomed  Behavior: Appropriate Motor: Pt. Reports some instability due to Lewey Body dementia Speech/Language:  Normal Rate Affect: Appropriate Mood: normal Thought process: normal Thought content:   WNL Sensory/Perceptual disturbances:  WNL Orientation: oriented to person, place, and situa tion Attention: Good Concentration: Good Memory: Impaired short term Fund of knowledge:  Good Insight:  Good Judgment:  Good Impulse Control: Good  Risk Assessment: Danger to Self: No Self-injurious Behavior: No Danger to Others: No Duty to Warn:no Physical Aggression / Violence:No  Access to Firearms a concern: No  Gang Involvement:No  Treatment plan: Will employ cognitive behavioral therapy as well as grief and loss  therapy for relief of anxiety and grief.  Goals of grief  therapy or to have a healthier response to the loss of his daughter and less sadness as evidenced by patient report in therapy notes as well as to help him feel less overwhelmed by her loss.  Goals are to see a 50% reduction in grief symptoms over the next 6 months.  I will encourage the use of the patient telling his grief story about his daughter including encouraging him to br ing pictures and memorabilia to help process his feelings including any feelings of guilt associated with her loss.  We will use cognitive behavioral therapy to help identify and change anxiety producing thoughts and behavior patterns so as to improve his ability to better manage anxiety and stress, and manage thoughts and worrisome thinking contributing to anxiety.  We will also refresh and encourage dialectical behavior therapy distre ss tolerance and mindfulness skills with the intention of reducing anxiety by 50% over the next 6 months. Progress: 30% Interventions: Cognitive Behavioral Therapy  Diagnosis:PTSD, Bi-Polar D/O  Plan: F/U/ appointments scheduled weekly.  Cecile Coder, [facility]                                                                                                                    Cecile Coder,  [facility]  [place] [facility] Counselo r/Therapist Progress Note  Patient ID: Fredderick Swanger, MRN: [id number],    Date: 04/01/2024 This session was held via video teletherapy. The patient consented to the video teletherapy and was located in his home during this session. He is aware it is the responsibility of the patient to secure confidentiality on his end of the session. The provider was in a private home office for the duration of this session.   The patient arr ived on time for her Caregility session  Time Spent: 58 minutes, 2 PM to 2:58 PM  Treatment Type: Individual Therapy Reported Symptoms: anxiety, panic attacks A fairly difficult week because it was his daughter's birthday.  He said this year was more  difficult than last year but he could not pinpoint why.  They did remember her about going out to a nice restaurant which is something her daughter always wanted to do.  He said they w ere able to laugh and joke about funny things that his daughter had said or done which is not something they were able to do this time last year so he saw that as healthy grieving.  He knows that the time change threw things off a little bit for everybody and his wife still is not feeling well and his back has been bothering him.  He was not able to go to his first round of physical therapy this week because his back was bothering him m ore.  He has been thinking a lot more about his daughter which he feels comfortable with.  He is thankful that it is getting warmer and staying light longer so he can get back to some of the coping skills such as fishing that he has done in the past.   We will continue to work on coping skills for helping with his anxiety and the progression of his Lewy body dementia diagnosis as well as processing his grief. Mental Status Exam: Ap pearance:  Well Groomed  Behavior: Appropriate Motor: Pt. Reports some instability due to Lewey Body dementia Speech/Language:  Normal Rate Affect: Appropriate Mood: normal Thought process: normal Thought content:  WNL Sensory/Perceptual disturbances:  WNL Orientation: oriented to person, place, and situation Attention: Good Concentration: Good Memory: Impaired short term Fund of knowledge:  Good Insight:  Good Judgm ent:  Good Impulse Control: Good  Risk Assessment: Danger to Self: No Self-injurious Behavior: No Danger to Others: No Duty to Warn:no Physical Aggression / Violence:No  Access to Firearms a concern: No  Gang Involvement:No  Treatment plan: Will employ cognitive behavioral therapy as well as grief and loss therapy for relief of anxiety and grief.  Goals of grief therapy or to have a healthier response to the loss of his daug  hter and less sadness as evidenced by patient report in therapy notes as well as to help him feel less overwhelmed by her loss.  Goals are to see a 50% reduction in grief symptoms over the next 6 months.  I will encourage the use of the patient telling his grief story about his daughter including encouraging him to bring pictures and memorabilia to help process his feelings including any feelings of guilt associated with her loss.  We w ill use cognitive behavioral therapy to help identify and change anxiety producing thoughts and behavior patterns so as to improve his ability to better manage anxiety and stress, and manage thoughts and worrisome thinking contributing to anxiety.  We will also refresh and encourage dialectical behavior therapy  distress tolerance and mindfulness skills with the intention of reducing anxiety by 50% over the next 6 months. Progress: 30%  Interventions: Cognitive Behavioral Therapy  Diagnosis:PTSD, Bi-Polar D/O  Plan: F/U/ appointments scheduled weekly.  Cecile Coder, [facility]                                                                                                                   Cecile Coder, [facility]               Scarlett Current s, [facility]               Cecile Coder, [facility]  [place] [facility] Counselor/Therapist Progress Note  Patient ID:  Brevin Mcfadden, MRN: [id number],    Date: 04/01/2024 This session was held via video teletherapy. The patient consented to the video teletherapy and was located in his home during this session. He is aware it is the responsibility of the patient to secure confidentiality on his end of the session. The provider was in a private home office for the duration of this session.   The patient arrived on time for her Caregility session   Time Spent: 1:05 PM until 2 PM, 55 minutes Treatment Type: Individual Therapy Reported Symptoms: anxiety, panic attacks The patient said he came in from his walk this morning and saw what he thought was some sort of tic from his wife.  It was not the first time it happened and he wonders if it might be tardive dyskinesia because of some new medications that she is on.  She has a call into the doctor right now and hopes to hear some thing back soon.  They have narrowed down to the fact they think most of her discomfort in breathing is coming from the diaphragm and they are looking for alternatives for  treatment.  The patient continues to do those things which help him cope including walking, fishing when he can, spending time with his son and daughter-in-law and grandson.  He also has a way of continued coping with grief he is writing letters to his daughters as  a way of expressing his feelings and says that is very beneficial.  He appears to be grieving in a healthy way but he knows that he to year anniversary of her death is coming up and there is some grief there but says it is not overwhelming.  We will continue to work on coping skills for helping with his anxiety and the progression of his Lewy body dementia diagnosis as well as processing his grief. Mental Status Exam: Appearance:  W ell Groomed  Behavior: Appropriate Motor: Pt. Reports some instability due to Lewey Body dementia Speech/Language:  Normal Rate Affect: Appropriate Mood: normal Thought process: normal Thought content:  WNL Sensory/Perceptual disturbances:  WNL Orientation: oriented to person, place, and situation Attention: Good Concentration: Good Memory: Impaired short term Fund of knowledge:  Good Insight:  Good Judgment:  Good  Impulse Control: Good  Risk Assessment: Danger to Self: No Self-injurious Behavior: No Danger to Others: No Duty to Warn:no Physical Aggression / Violence:No  Access to Firearms a concern: No  Gang Involvement:No  Treatment plan: Will employ cognitive behavioral therapy as well as grief and loss therapy for relief of anxiety and grief.  Goals of grief therapy or to have a healthier response to the loss of his daughter and les s sadness as evidenced by patient report in therapy notes as well as to help him feel less overwhelmed by her loss.  Goals are to see a 50% reduction in grief symptoms over the next 6 months.  I will encourage the use of the patient telling his grief story about his daughter including encouraging him to bring pictures and memorabilia to help process his  feelings including any feelings of guilt associated with her loss.  We will use cogn itive behavioral therapy to help identify and change anxiety producing thoughts and behavior patterns so as to improve his ability to better manage anxiety and stress, and manage thoughts and worrisome thinking contributing to anxiety.  We will also refresh and encourage dialectical behavior therapy distress tolerance  and mindfulness skills with the intention of reducing anxiety by 50% over the next 6 months. Progress: 30% Interventio ns: Cognitive Behavioral Therapy  Diagnosis:PTSD, Bi-Polar D/O  Plan: F/U/ appointments scheduled weekly.  Cecile Coder, [facility]                                                                                                                   Cecile Coder, [facility]  [place] [facility] Counselor/Therapist Progress Note  Patient ID: Armonie Staten, MRN: [id number],    Date: 04/01/2024 This session was held via video tel etherapy. The patient consented to the video teletherapy and was located in his home during this session. He is aware it is the responsibility of the patient to secure confidentiality on his end of the session. The provider was in a private home office for the duration of this session.   The patient arrived on time for her Caregility session  Time Spent: 59 minutes, 2 PM to 2:59 PM  Treatment Type: Individual Therapy Reported Sym ptoms: anxiety, panic attacks The patient continues to present with physical pain and both his back and his knee.  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  He has continued to walk because he knows that is good for him in various ways but says  it has been painful.  His wife also was not feeling well but she is going back to the doctor next week to have an ablation and hopes that will bring her some relief.  For the most part he has been at the last couple weeks reaching back out and hearing from some old friends as well as time with his son and son's family.  He says that his wife's cousin is coming down for a weekend soon and they enjoy time with her and that he will go to h is brother's house sometime in the next few weeks.  He recognizes the need for people that he cares about in helping with his depression. We will continue to work on coping skills for  helping with his anxiety and the progression of his Lewy body dementia diagnosis as well as processing his grief. Mental Status Exam: Appearance:  Well Groomed  Behavior: Appropriate Motor: Pt. Reports some instability due to Lewey Body dementia Sp eech/Language:  Normal Rate Affect: Appropriate Mood: normal Thought process: normal Thought content:  WNL Sensory/Perceptual disturbances:  WNL Orientation: oriented to person, place, and situation Attention: Good Concentration: Good Memory: Impaired short term Fund of knowledge:  Good Insight:  Good Judgment:  Good Impulse Control: Good  Risk Assessment: Danger to Self: No Self-injurious Behavior: No Danger to Ot hers: No Duty to Warn:no Physical Aggression / Violence:No  Access to Firearms a concern: No  Gang Involvement:No  Treatment plan: Will employ cognitive behavioral therapy as well as grief and loss therapy for relief of anxiety and grief.  Goals of grief therapy or to have a healthier response to the loss of his daughter and less sadness as evidenced by patient report in therapy notes as well as to help him feel less overwhelmed by  her loss.  Goals are to see a 50% reduction in grief symptoms over the next 6 months.  I will encourage the use of the patient telling his grief story about his daughter including encouraging him to bring pictures and memorabilia to help process his feelings including any feelings of guilt associated with her loss.  We will use cognitive behavioral therapy to help identify and change anxiety producing thoughts and behavior patterns so  as to improve his ability to better manage anxiety and stress, and manage thoughts and worrisome thinking contributing to anxiety.  We will also  refresh and encourage dialectical behavior therapy distress tolerance and mindfulness skills with the intention of reducing anxiety by 50% over the next 6 months. Progress: 30% Interventions: Cognitive Behavioral  Therapy  Diagnosis:PTSD, Bi-Polar D/O  Plan: F/U/ appointments scheduled wee kly.  Cecile Coder, [facility]                                                                                                                   Cecile Coder, [facility]               Cecile Coder, [facility]               Cecile Coder, [facility]               Cecile Coder, [facility]  [place] [facility] Counselor/Therapist Progress Note  Patient ID: Zain Lankford, MRN: [id number],    Date: 5/30/202 5 This session was held via video teletherapy. The patient consented to the video teletherapy and was  located in his home during this session. He is aware it is the responsibility of the patient to secure confidentiality on his end of the session. The provider was in a private home office for the duration of this session.   The patient arrived on time for her Caregility session  Time Spent: 57 minutes, 2 PM to 2:57 PM  Treatment  Type: Individual Therapy Reported Symptoms: anxiety, panic attacks The patient is still having significant back pain.  He cannot sit anywhere for very long and has to be careful where he sits.  There is still some unsteadiness although he did not report any falls over the past 2 weeks.  His biggest concern now is for his wife.  She has been having some difficulty breathing and they did a test so they found a small hole on the back sid e of her heart.  She is meeting with her cardiologist again next week to see what possible treatment is for her.  He still thinks there may be some issue with her diaphragm but she is meeting with the pulmonologist also.  He is doing what he can to help her.  His daughter's birthday is March 11 so he knows that is a difficult time for both he and his wife next week.  Typically they do something to remember her but because of the wife' s current health issues they cannot go to [place] as they had planned so they are talking about what to do to remember and celebrate her birthday.  We will continue to work on coping skills for helping with his anxiety and the progression of his Lewy body dementia diagnosis as well as processing his grief. Mental Status Exam: Appearance:  Well Groomed  Behavior: Appropriate Motor: Pt. Reports some instability due to Lewey  Body dementia Speech/Language:  Normal Rate Affect: Appropriate Mood: normal Thought process: normal Thought content:  WNL Sensory/Perceptual disturbances:  WNL Orientation: oriented to person, place, and situation Attention: Good Concentration: Good Memory: Impaired short  term Fund of knowledge:  Good Insight:  Good Judgment:  Good Impulse Control: Good  Risk Assessment: Danger to Self: No Self-injurious Behavior:  No Danger to Others: No Duty to Warn:no Physical Aggression / Violence:No  Access to Firearms a concern: No  Gang Involvement:No  Treatment plan: Will employ cognitive behavioral therapy as well as grief and loss therapy for relief of anxiety and grief.  Goals of grief therapy or to have a healthier response to the loss of his daughter and less sadness as evidenced by patient report in therapy notes as well as to help him feel le ss overwhelmed by her loss.  Goals are to see a 50% reduction in grief symptoms over the next 6 months.  I will encourage the use of the patient telling his grief story about his daughter including encouraging him to bring pictures and memorabilia to help process his feelings including any feelings of guilt associated with her loss.  We will use cognitive behavioral therapy to help identify and change anxiety producing thoughts and beha vior patterns so as to improve his ability to better manage anxiety and stress, and manage thoughts and worrisome thinking contributing to anxiety.  We will also refresh and encourage dialectical behavior therapy distress tolerance and mindfulness  skills with the intention of reducing anxiety by 50% over the next 6 months. Progress: 30% Interventions: Cognitive Behavioral Therapy  Diagnosis:PTSD, Bi-Polar D/O  Plan: F/U/ appointme nts scheduled weekly.  Cecile Coder, [facility]                                                                                                                   Cecile Coder, [facility]  [place] [facility] Counselor/Therapist Progress Note  Patient ID: Dalin Caldera, MRN: [id number],    Date: 04/01/2024 This session was held via video teletherapy. The patient consented to the video teletherapy and was located in his home dur ing this session. He is aware it is the responsibility of the patient to secure confidentiality on his end of the session. The provider was in a private home office for the duration of this session.   The patient arrived on time for her Caregility session  Time Spent: 58 minutes, 2 PM to 2:58 PM  Treatment Type: Individual Therapy Reported Symptoms: anxiety, panic attacks A fairly difficult week because it was his daughter's bir thday.  He said this year was more difficult than last year but he could not pinpoint why.  They did remember her about going out to a nice restaurant which is something her daughter  always wanted to do.  He said they were able to laugh and joke about funny things that his daughter had said or done which is not something they were able to do this time last year so he saw that as healthy grieving.  He knows that the time change threw thi ngs off a little bit for everybody and his wife still is not feeling well and his back has been bothering him.  He was not able to go to his first round of physical therapy this week because his back was bothering him more.  He has been thinking a lot more about his daughter which he feels comfortable with.  He is thankful that it is getting warmer and staying light longer so he can get back to some of the coping skills such as fishing  that he has done in the past.   We will continue to work on coping skills for helping with his anxiety and the progression of his Lewy body dementia diagnosis as well as processing his grief. Mental Status Exam: Appearance:  Well Groomed  Behavior: Appropriate Motor: Pt. Reports some instability due to Lewey Body dementia Speech/Language:  Normal Rate Affect: Appropriate Mood: normal Thought process: normal Thought content :  WNL Sensory/Perceptual disturbances:  WNL Orientation: oriented to person, place, and situation Attention: Good Concentration: Good Memory: Impaired short term Fund of knowledge:  Good Insight:  Good Judgment:  Good Impulse Control: Good  Risk Assessment: Danger to Self: No Self-injurious Behavior: No Danger to Others: No Duty to Warn:no Physical Aggression / Violence:No  Access to Firearms a concern: No  Gang I nvolvement:No  Treatment plan: Will employ cognitive behavioral therapy as well as grief and loss therapy for relief of anxiety and grief.  Goals of grief therapy or to have a healthier response to the loss of his daughter and less sadness as evidenced by patient report in therapy notes as well as to help him feel less overwhelmed by her loss.  Goals are to see a  50% reduction in grief symptoms over the next 6 months.  I will encourage  the use of the patient telling his grief story about his daughter including encouraging him to bring pictures and memorabilia to help process his feelings including any feelings of guilt associated with her loss.  We will use cognitive behavioral therapy to help identify and change anxiety producing thoughts and behavior patterns so as to improve his ability to better manage anxiety and stress, and manage thoughts and worrisome thinkin g contributing to anxiety.  We will also refresh and encourage dialectical behavior therapy  distress tolerance and mindfulness skills with the intention of reducing anxiety by 50% over the next 6 months. Progress: 30% Interventions: Cognitive Behavioral Therapy  Diagnosis:PTSD, Bi-Polar D/O  Plan: F/U/ appointments scheduled weekly.  Cecile Coder, [facility]                                                                                                                    Cecile Coder, [facility]               Cecile Coder, [facility]               Cecile Coder, [facility]  [place] [facility] Counselor/Therapist Progress Note  Patient ID: Gibbs Naugle, MRN: [id number],    Date: 04/01/2024 This session was held via video teletherapy. The patient consented to the video teletherapy and was located in his home during this session. He is aware  it is the responsibility of the patient to secure confidentiality on his end of the session. The provider was in a private home office for the duration of this session.   The patient arrived on time for her Caregility session  Time Spent: 57 minutes, 2 PM to 2:57 PM  Treatment Type: Individual Therapy Reported Symptoms: anxiety, panic attacks The patient is still having significant back pain.  He cannot sit anywhere for very lo ng and has to be careful where he sits.  There is still some unsteadiness although he did not report any falls over the past 2 weeks.  His biggest concern now is for his wife.  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  She is meeting with her cardiologist again next week to see what possible treatment is for her.  He still thinks there may be some issue w ith her diaphragm but she is meeting with the pulmonologist also.  He is doing what he can to help her.  His daughter's birthday is March 11 so he knows that is a difficult time for  both he and his wife next week.  Typically they do something to remember her but because of the wife's current health issues they cannot go to [place] as they had planned so they are talking about what to do to remember and celebrate her birthday.   We will continue to work on coping skills for helping with his anxiety and the progression of his Lewy body dementia diagnosis as well as processing his grief. Mental Status Exam: Appearance:  Well Groomed  Behavior: Appropriate Motor: Pt. Reports some instability due to Lewey Body dementia Speech/Language:  Normal Rate Affect: Appropriate Mood: normal Thought process: normal Thought content:  WNL Sensory/Perceptual distur bances:  WNL Orientation: oriented to person, place, and situation Attention: Good Concentration: Good Memory: Impaired short term Fund of knowledge:  Good Insight:  Good Judgment:  Good Impulse Control: Good  Risk Assessment: Danger to Self: No Self-injurious Behavior: No Danger to Others: No Duty to Warn:no Physical Aggression / Violence:No  Access to Firearms a concern: No  Gang Involvement:No  Treatment plan: Wi ll employ cognitive behavioral therapy as well as grief and loss therapy for relief of anxiety and grief.  Goals of grief therapy or to have a healthier response to the loss of his daughter and less sadness as evidenced by patient report in therapy notes as well as to help him feel less overwhelmed by her loss.  Goals are to see a 50% reduction in grief symptoms over the next 6 months.  I will encourage the use of the patient telling hi s grief story about his daughter including encouraging him to bring pictures and memorabilia to help process his feelings including any feelings of guilt associated with her loss.  We will use cognitive behavioral therapy to help identify and change anxiety producing thoughts and behavior patterns so as to improve his ability to better manage anxiety and stress, and  manage thoughts and worrisome thinking contributing to anxiety.  We wil l also refresh and encourage dialectical behavior therapy distress tolerance and mindfulness  skills with the intention of reducing anxiety by 50% over the next 6 months. Progress: 30% Interventions: Cognitive Behavioral Therapy  Diagnosis:PTSD, Bi-Polar D/O  Plan: F/U/ appointments scheduled weekly.  Cecile Coder, [facility]                                                                                                                    Cecile Coder, [facility]   Behavioral Health Counselor/Therapist Progress Note  Patient ID: Yusuke Beza, MRN: [id number],    Date: 04/01/2024 This session was held via video teletherapy. The patient consented to the video teletherapy and was located in his home during this session. He is aware it is the responsibility of the patient to secure confidentiality on his end of the session. The provider was in a private ho me office for the duration of this session.   The patient arrived on time for her Caregility session  Time Spent: 57 minutes, 2 PM to 2:57 PM  Treatment Type: Individual Therapy Reported Symptoms: anxiety, panic attacks  We will continue to work on coping skills for helping with his anxiety and the progression of his Lewy body dementia diagnosis as well as processing his grief. 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 Speech/Language:  Normal Rate Affect: Appropriate Mood: normal Thought process: normal Thought content:  WNL Sensory/Perceptual disturbances:  WNL Orientation: oriented to person, place, and situation Attention: Good Concentration: Good Memory: Impaired short term Fund of knowledge:  Good Insight:  Good Judgment:  Good Impulse Contro l: Good  Risk Assessment: Danger to Self: No Self-injurious Behavior: No Danger to Others: No Duty to Warn:no Physical Aggression / Violence:No  Access to Firearms a concern: No  Gang Involvement:No  Treatment plan: Will employ cognitive behavioral therapy as well as grief and loss therapy for relief of anxiety and grief.  Goals of grief therapy or to have a healthier response to the loss of his daughter and less sadness as e videnced by patient report in therapy notes as well as to help him feel less overwhelmed by her loss.  Goals are to see a 50% reduction in grief symptoms over the next 6 months.  I will encourage the use of the patient telling his grief story about his daughter including encouraging him to bring  pictures and memorabilia to help process his feelings including any feelings of guilt associated with her loss.  We will use cognitive behavior al therapy to help identify and change anxiety producing thoughts and behavior patterns so as to improve his ability to better manage anxiety and stress, and manage thoughts and worrisome thinking contributing to anxiety.  We will also refresh and encourage dialectical behavior therapy distress tolerance and mindfulness skills with the intention of reducing anxiety by 50% over the next 6 months. Progress: 30% Interventions: Cognitive  Behavioral Therapy  Diagnosis:PTSD, Bi-Polar D/O  Plan: F/U/ appointments scheduled weekly.  Cecile Coder, [facility]                                                                                                                   Cecile Coder, [facility]               Cecile Coder, [facility]                Cecile Coder, [facility]  Cecile Coder, [facility]               Cecile Coder, [facility]               Cecile Coder, [facility]               Cecile Coder, [facility]  [place] [facility] Counselor/Therapist Progress Note  Patient ID: Prakash Kimberling, MRN: [id number],    Date: 04/01/2024 This session was held via video teletherapy. The patient consented to the video teletherapy and was located in his home during this session. He is aware it is the responsibility of the patient  to secure confidentiality on his end of the session. The provider was in a private home office for the duration of this session.   The patient arrived on time for her Caregility session  Time Spent: 39 minutes, 2 PM to 2:39 PM  Treatment Type: Individual Therapy Reported Symptoms: anxiety, panic attacks The patient was not feeling well today.  He started feeling really earlier in the week having a fever bad headaches body aches.   He felt a little better yesterday but feels a little bit of a resurgence in headaches and body aches and is running a low-grade fever.  His wife is feeling some of the same symptoms.  For the most part he says he is doing well.  They did increase his memory medication because they were starting to see some increasing decline in short-term memory and he said until recently his long-term memory had been good but he was starting to see a  difference in how he used to markers to remember what happened at certain times historically.  He feels the medication has helped and has brightened his mood a little also.  He is  still trying to walk every day and fish when he can and has enjoyed watching the [employer] as a good distraction.  He reports that he feels he is managing mood fairly well.  He has an appointment in 2 weeks.  He does contract for safety having  no thoughts of hurting himself or anyone else. We will continue to work on coping skills for helping with his anxiety and the progression of his Lewy body dementia diagnosis as well as processing his grief. Mental Status Exam: Appearance:  Well Groomed  Behavior: Appropriate Motor: Pt. Reports some instability due to Lewey Body dementia Speech/Language:  Normal Rate Affect: Appropriate Mood: normal Thought process: normal Stephen Myers hought content:  WNL Sensory/Perceptual disturbances:  WNL Orientation: oriented to person, place, and situation Attention: Good Concentration: Good Memory: Impaired short term Fund of knowledge:  Good Insight:  Good Judgment:  Good Impulse Control: Good  Risk Assessment: Danger to Self: No Self-injurious Behavior: No Danger to Others: No Duty to Warn:no Physical Aggression / Violence:No  Access to Firearms a concer n: No  Gang Involvement:No  Treatment plan: Will employ cognitive behavioral therapy as well as grief and loss therapy for relief of anxiety and grief.  Goals of grief therapy or to have a healthier response to the loss of his daughter and less sadness as evidenced by patient report in therapy notes as well as to help him feel less overwhelmed by her loss.  Goals are to see a 50% reduction in grief symptoms over the next 6 months.  I  will encourage the use of the patient telling his grief story about his daughter including encouraging him to bring pictures and memorabilia to help process his feelings including any feelings of guilt associated with her loss.  We will use cognitive behavioral therapy to help identify and change anxiety producing thoughts and behavior patterns so as to improve  his ability to better manage anxiety and stress, and manage thoughts and wor risome thinking contributing to anxiety.  We will also refresh  and encourage dialectical behavior therapy distress tolerance and mindfulness skills with the intention of reducing anxiety by 50% over the next 6 months. Progress: 30% Interventions: Cognitive Behavioral Therapy  Diagnosis:PTSD, Bi-Polar D/O  Plan: F/U/ appointments scheduled weekly.  Cecile Coder, [facility]                                                                                                                    Cecile Coder, [facility]  [place] [facility]  Counselor/Therapist Progress Note  Patient ID: Geoge Lawrance, MRN: [id number],    Date: 04/01/2024 This session was held via video teletherapy. The patient consented to the video teletherapy and was located in his home during this session. He is aware it is the responsibility of the patient to secure confidentiality on his end  of the session. The provider was in a private home office for the duration of this session.   The patient arrived on time for her Caregility session  Time Spent: 58 minutes, 2 PM to 2:58 PM  Treatment Type: Individual Therapy Reported Symptoms: anxiety, panic attacks A fairly difficult week because it was his daughter's birthday.  He said this year was more difficult than last year but he could not pinpoint why.  They did rememb er her about going out to [place] [employer]video which is something her daughter always wanted to do.  He said they were able to laugh and joke about funny things that his daughter had said or done which is not something they were able to do this time last year so he saw that as healthy grieving.  He knows that the time change threw things off a little bit for everybody and his wife still is not feeling well and his back has been bothering h im.  He was not able to go to his first round of physical therapy this week because his back was bothering him more.  He has been thinking a lot more about his daughter which he feels comfortable with.  He is thankful that it is getting warmer and staying light longer so he can get back to some of the coping skills such as fishing that he has done in the past.   We will continue to work on coping skills for helping with his anxiety a nd the progression of his Lewy body dementia diagnosis as well as processing his grief. Mental Status Exam: Appearance:  Well Groomed  Behavior: Appropriate Motor: Pt. Reports some instability due to Lewey Body dementia Speech/Language:  Normal  Rate Affect: Appropriate Mood: normal Thought process: normal Thought content:  WNL Sensory/Perceptual disturbances:  WNL Orientation: oriented to person, place, and situation Att ention: Good Concentration: Good Memory: Impaired short term Fund of knowledge:  Good Insight:  Good Judgment:  Good Impulse Control: Good  Risk Assessment: Danger to Self: No Self-injurious Behavior: No Danger to Others: No Duty to Warn:no Physical Aggression / Violence:No  Access to Firearms a concern: No  Gang Involvement:No  Treatment plan: Will employ cognitive behavioral therapy as well as grief and loss therapy  for relief of anxiety and grief.  Goals of grief therapy or to have a healthier response to the loss of his daughter and less sadness as evidenced by patient report in therapy notes as well as to help him feel less overwhelmed by her loss.  Goals are to see a 50% reduction in grief symptoms over the next 6 months.  I will encourage the use of the patient telling his grief story about his daughter including encouraging him to bring pictu res and memorabilia to help process his feelings including any feelings of guilt associated with her loss.  We will use cognitive behavioral therapy to help identify and change anxiety producing thoughts and behavior patterns so as to improve his ability to better manage anxiety and stress, and manage thoughts and worrisome thinking contributing to anxiety.  We will also refresh and encourage dialectical behavior therapy distress  tolera nce and mindfulness skills with the intention of reducing anxiety by 50% over the next 6 months. Progress: 30% Interventions: Cognitive Behavioral Therapy  Diagnosis:PTSD, Bi-Polar D/O  Plan: F/U/ appointments scheduled weekly.  Cecile Coder,  [facility]                                                                                                                    Cecile Coder, [facility]               Cecile Coder, [facility]               Cecile Coder, [facility]   Behavioral Health Counselor/Therapist Progress Note  Patient ID: Doye Montilla, MRN: [id number],    Date: 04/01/2024 This session was held via video teletherapy. The patient consented to the video teletherapy and was located in his home during this session. He is aware it is the responsibility of the patient to secure confidentiality on his end of the session. The provider was in a  pri vate home office for the duration of this session.   The patient arrived on time for her Caregility session  Time Spent: 1:05 PM until 2 PM, 55 minutes Treatment Type: Individual Therapy Reported Symptoms: anxiety, panic attacks The patient said he came in from his walk this morning and saw what he thought was some sort of tic from his wife.  It was not the first time it happened and he wonders if it might be tardive dyskinesia  because of some new medications that she is on.  She has a call into the doctor right now and hopes to hear something back soon.  They have narrowed down to the fact they think most of her discomfort in breathing is coming from the diaphragm and they are looking for alternatives for treatment.  The patient continues to do those things which help him cope including walking, fishing when he can, spending time with his son and daughter-i n-law and grandson.  He also has a way of continued coping with grief he is writing letters to his daughters as a way of expressing his feelings and says that is very beneficial.  He appears to be grieving in a healthy way but he knows that he to year anniversary of her death is coming up and there is some grief there but says it is not overwhelming.  We will continue to work on coping skills for helping with his anxiety and the progr ession of his Lewy body dementia diagnosis as well as processing his grief. Mental Status Exam: Appearance:  Well Groomed  Behavior: Appropriate Motor: Pt. Reports some instability due to Lewey Body dementia Speech/Language:  Normal Rate Affect: Appropriate Mood: normal Thought process: normal Thought content:  WNL Sensory/Perceptual disturbances:  WNL Orientation: oriented to person, place, and situation Attention: Good  Concentration: Good Memory: Impaired short term Fund of knowledge:  Good Insight:  Good Judgment:  Good Impulse Control: Good  Risk Assessment: Danger to Self:  No Self-injurious Behavior: No Danger to Others: No Duty to Warn:no Physical Aggression / Violence:No  Access to Firearms a concern: No  Gang Involvement:No  Treatment plan: Will employ cognitive behavioral therapy as well as grief and loss therapy for relief o f anxiety and grief.  Goals of grief therapy or to have a healthier response to the loss of his daughter and less sadness as evidenced by patient report in therapy notes as well as to help him feel less overwhelmed by her loss.  Goals are to see a 50% reduction in grief symptoms over the next 6 months.  I will encourage the use of the patient telling his grief story about his daughter including encouraging him to bring pictures and memo rabilia to help process his feelings including any feelings of guilt associated with her loss.  We will use cognitive behavioral therapy to help identify and change anxiety producing thoughts and behavior patterns so as to improve his ability to better manage anxiety and stress, and manage thoughts and worrisome thinking contributing to anxiety.  We will also refresh and encourage dialectical behavior therapy distress tolerance and  mind fulness skills with the intention of reducing anxiety by 50% over the next 6 months. Progress: 30% Interventions: Cognitive Behavioral Therapy  Diagnosis:PTSD, Bi-Polar D/O  Plan: F/U/ appointments scheduled weekly.  Cecile Coder, [facility]                                                                                                                    Cecile Coder,  [facility]  [place] [facility] Counselor/Therapist Progress  Note  Patient ID: Kainalu Heggs, MRN: [id number],    Date: 04/01/2024 This session was held via video teletherapy. The patient consented to the video teletherapy and was located in his home during this session. He is aware it is the responsibility of the patient to secure confidentiality on his end of the session. The provider was in a private home office for the duration of this session.   The patient arrived on time for her  Caregility session  Time Spent: 59 minutes, 2 PM to 2:59 PM  Treatment Type: Individual Therapy Reported Symptoms: anxiety, panic attacks The patient continues to present with physical pain and both his back and his knee.  He  started with a new physical therapist and told him that there was a lot of arthritis in both knees and or certain things he cannot do but they had him do that anyway and now for the past 3 days he has been in  significant knee pain.  He has continued to walk because he knows that is good for him in various ways but says it has been painful.  His wife also was not feeling well but she is going back to the doctor next week to have an ablation and hopes that will bring her some relief.  For the most part he has been at the last couple weeks reaching back out and hearing from some old friends as well as time with his son and son's family.  He say s that his wife's cousin is coming down for a weekend soon and they enjoy time with her and that he will go to his brother's house sometime in the next few weeks.  He recognizes the need for people that he cares about in helping with his depression. We will continue to work on coping skills for helping with his anxiety and the progression of his Lewy body dementia diagnosis as well as processing his grief. Mental Status Exam: Appeara nce:  Well Groomed  Behavior: Appropriate Motor: Pt. Reports some instability due to Lewey Body dementia Speech/Language:  Normal Rate Affect: Appropriate Mood: normal Thought process: normal Thought content:  WNL Sensory/Perceptual disturbances:  WNL Orientation: oriented to person, place, and situation Attention: Good Concentration: Good Memory: Impaired short term Fund of knowledge:  Good Insight:  Good Judgment:   Good Impulse Control: Good  Risk Assessment: Danger to Self: No Self-injurious Behavior: No Danger to Others: No Duty to Warn:no Physical Aggression / Violence:No  Access to Firearms a concern: No  Gang Involvement:No  Treatment plan: Will employ cognitive behavioral therapy as well as grief and loss therapy for relief of anxiety and grief.  Goals of grief therapy or to have a healthier response to  the loss of his daughter  and less sadness as evidenced by patient report in therapy notes as well as to help him feel less overwhelmed by her loss.  Goals are to see a 50% reduction in grief symptoms over the next 6 months.  I will encourage the use of the patient telling his grief story about his daughter including encouraging him to bring pictures and memorabilia to help process his feelings including any feelings of guilt associated with her loss.  We will u se cognitive behavioral therapy to help identify and change anxiety producing thoughts and behavior patterns so as to improve his ability to better manage anxiety and stress, and manage thoughts and worrisome thinking contributing to anxiety.  We will  also refresh and encourage dialectical behavior therapy distress tolerance and mindfulness skills with the intention of reducing anxiety by 50% over the next 6 months. Progress: 30% Inte rventions: Cognitive Behavioral Therapy  Diagnosis:PTSD, Bi-Polar D/O  Plan: F/U/ appointments scheduled weekly.  Cecile Coder, [facility]                                                                                                                   Cecile Coder, [facility]               Cecile Coder, [place] [facility]               Cecile Coder, [facility]               Cecile Coder, [facility]  [facility] Counselor/Therapist Progress Note  Patient ID: Verle Wheeling, MRN: [id number],    Date: 04/01/2024 This session was held via video teletherapy. The patient consented to the video teletherapy and was located in his home during this session. He is aware it is the responsibility of the patient to secure confidentiality on his end of the session. The provider was in a private home office for the duration of this session.    The patient arrived on time for her Caregility session  Time Spent: 57 minutes, 2 PM to 2:57 PM  Treatment Type: Individual Therapy Reported Symptoms: anxiety, panic attacks The patient is still having significant back pain.  He cannot sit anywhere for very long and has to be careful where he sits.  There is still some unsteadiness although he did not report any falls over the past 2 weeks.  His biggest concern now is for his wif e.  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  She is meeting with her cardiologist again next week to  see what possible treatment is for her.  He still thinks there may be some issue with her diaphragm but she is meeting with the pulmonologist also.  He is doing what he can to help her.  His daughter's birthday is March 11 so he knows that is a difficul [place] time for both he and his wife next week.  Typically they do something to remember her but because of the wife's current health issues they cannot go to [place] as they had planned so they are talking about what to do to remember and celebrate her birthday.  We will continue to work on coping skills for helping with his anxiety and the progression of his Lewy body dementia diagnosis as well as processing his grief. Mental Stat us  Exam: Appearance:  Well Groomed  Behavior: Appropriate Motor: Pt. Reports some instability due to Lewey Body dementia Speech/Language:  Normal Rate Affect: Appropriate Mood: normal Thought process: normal Thought content:  WNL Sensory/Perceptual disturbances:  WNL Orientation: oriented to person, place, and situation Attention: Good Concentration: Good Memory: Impaired short term Fund of knowledge:  Good Insight:   Good Judgment:  Good Impulse Control: Good  Risk Assessment: Danger to Self: No Self-injurious Behavior: No Danger to Others: No Duty to Warn:no Physical Aggression / Violence:No  Access to Firearms a concern: No  Gang Involvement:No  Treatment plan: Will employ cognitive behavioral therapy as well as grief and loss therapy for relief of anxiety and grief.  Goals of grief therapy or to have a healthier response to the loss  of his daughter and less sadness as evidenced by patient report in therapy notes as well as to help him feel less overwhelmed by her loss.  Goals are to see a 50% reduction in grief symptoms over the next 6 months.  I will encourage the use of the patient telling his grief story about his daughter including encouraging him to bring pictures and memorabilia to help  process his feelings including any feelings of guilt associated with her  loss.  We will use cognitive behavioral therapy to help identify and change anxiety producing thoughts and behavior patterns so as to improve his ability to better manage anxiety and stress, and manage thoughts and worrisome thinking contributing to anxiety.  We will also refresh and encourage dialectical behavior therapy distress tolerance and mindfulness  skills with the intention of reducing anxiety by 50% over the next 6 months. Pr ogress: 30% Interventions: Cognitive Behavioral Therapy  Diagnosis:PTSD, Bi-Polar D/O  Plan: F/U/ appointments scheduled weekly.  Cecile Coder, [facility]                                                                                                                   Cecile Coder, [facility]   Behavioral Health Counselor/Therapist Progress Note  Patient ID: Caydence Enck, MRN: [id number],    Date: 04/01/2024 This sessio n was held via video teletherapy. The patient consented to the video teletherapy and was located in his home during this session. He is aware it is the responsibility of the patient to secure confidentiality on his end of the session. The provider was in a private home office for the duration of this session.   The patient arrived on time for her Caregility session  Time Spent: 58 minutes, 2 PM to 2:58 PM  Treatment Type: Individu al Therapy Reported Symptoms: anxiety, panic attacks A fairly difficult week because it was his daughter's birthday.  He said this year was more difficult than last year but he could not pinpoint why.  They did remember her about going out to a nice restaurant which is something her daughter always wanted to do.  He said they were able to laugh and joke about funny things that his daughter had said or done which is not something they  were able to do this time last year so he saw that as healthy grieving.  He knows that the time change threw things off a little bit for everybody and his wife still is not feeling well and his back has been bothering him.  He was not able to go to his first round of physical therapy this week because his back was bothering him more.  He has been thinking a lot more about his daughter which he feels comfortable with.  He is thankful tha [place] it is getting warmer and staying light longer so he can get back to some of the coping skills such as fishing that he has done in the past.   We will continue to work on coping  skills for helping with his anxiety and the progression of his Lewy body dementia diagnosis as well as processing his grief. Mental Status Exam: Appearance:  Well Groomed  Behavior: Appropriate Motor: Pt. Reports some instability due to Lewey Body deme ntia Speech/Language:  Normal Rate Affect: Appropriate Mood: normal Thought process: normal Thought content:  WNL Sensory/Perceptual disturbances:  WNL Orientation: oriented to person, place, and situation Attention: Good Concentration: Good Memory: Impaired short term Fund of knowledge:  Good Insight:  Good Judgment:  Good Impulse Control: Good  Risk Assessment: Danger to Self: No Self-injurious Behavior: No Marikay Show er to Others: No Duty to [employer] Physical Aggression / Violence:No  Access to Firearms a concern: No  Gang Involvement:No  Treatment plan: Will employ cognitive behavioral therapy as well as grief and loss therapy for relief of anxiety and grief.  Goals of grief therapy or to have a healthier response to the loss of his daughter and less sadness as evidenced by patient report in therapy notes as well as to help him feel less overwh elmed by her loss.  Goals are to see a 50% reduction in grief symptoms over the next 6 months.  I will encourage the use of the patient telling his grief story about his daughter including encouraging him to bring pictures and memorabilia to help process his feelings including any feelings of guilt associated with her loss.  We will use cognitive behavioral therapy to help identify and change anxiety producing thoughts and behavior patt erns so as to improve his ability to better manage anxiety and stress, and manage thoughts and worrisome thinking contributing to anxiety.  We will also refresh and encourage dialectical behavior therapy distress  tolerance and mindfulness skills with the intention of reducing anxiety by 50% over the next 6 months. Progress: 30% Interventions: Cognitive Behavioral  Therapy  Diagnosis:PTSD, Bi-Polar D/O  Plan: F/U/ appointments sched uled weekly.  Cecile Coder, [facility]                                                                                                                   Cecile Coder, [facility]               Cecile Coder, [facility]               Cecile Coder, [facility]  [place] [facility] Counselor/Therapist Progress Note  Patient ID: Onofre Gains, MRN: [id number],    Date: 04/01/2024 This session was held via video telether apy. The patient consented to the video teletherapy and was located in his home during this session. He is aware  it is the responsibility of the patient to secure confidentiality on his end of the session. The provider was in a private home office for the duration of this session.   The patient arrived on time for her Caregility session  Time Spent: 57 minutes, 2 PM to 2:57 PM  Treatment Type: Individual Therapy Reported Symptoms : anxiety, panic attacks The patient is still having significant back pain.  He cannot sit anywhere for very long and has to be careful where he sits.  There is still some unsteadiness although he did not report any falls over the past 2 weeks.  His biggest concern now is for his wife.  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  She is meeting with her ca rdiologist again next week to see what possible treatment is for her.  He still thinks there may be some issue with her diaphragm but she is meeting with the pulmonologist also.  He is doing what he can to help her.  His daughter's birthday is March 11 so he knows that is a difficult time for both he and his wife next week.  Typically they do something to remember her but because of the wife's current health issues they cannot go to [place] as they had planned so they are talking about what to do to remember and celebrate her birthday.  We will continue to work on coping skills for helping with his anxiety and the progression of his Lewy body dementia diagnosis as well as processing his grief. Mental Status Exam: Appearance:  Well Groomed  Behavior: Appropriate Motor: Pt. Reports some instability due to Lewey Body dementia Speech/Language:  Normal Rat e Affect: Appropriate Mood: normal Thought process: normal Thought content:  WNL Sensory/Perceptual disturbances:  WNL Orientation: oriented to person, place, and situation Attention: Good Concentration: Good Memory: Impaired short term Fund of knowledge:  Good Insight:   Good Judgment:  Good Impulse Control: Good  Risk Assessment: Danger to Self: No Self-injurious Behavior: No Danger to Others: No Duty to Warn:no  Physical Aggression / Violence:No  Access to Firearms a concern: No  Gang Involvement:No  Treatment plan: Will employ cognitive behavioral therapy as well as grief and loss therapy for relief of anxiety and grief.  Goals of grief therapy or to have a healthier response to the loss of his daughter and less sadness as evidenced by patient report in therapy notes as well as to help him feel less overwhelmed by her loss.  Goals are to s ee a 50% reduction in grief symptoms over the next 6 months.  I will encourage the use of the patient telling his grief story about his daughter including encouraging him to bring pictures and memorabilia to help process his feelings including any feelings of guilt associated with her loss.  We will use cognitive behavioral therapy to help identify and change anxiety producing thoughts and behavior patterns so as to improve his ability  to better manage anxiety and stress, and manage thoughts and worrisome thinking contributing to anxiety.  We will also refresh and encourage dialectical behavior therapy distress tolerance and mindfulness  skills with the intention of reducing anxiety by 50% over the next 6 months. Progress: 30% Interventions: Cognitive Behavioral Therapy  Diagnosis:PTSD, Bi-Polar D/O  Plan: F/U/ appointments scheduled weekly.  Cecile Coder, [facility]                                                                                                                   Cecile Coder, [facility]  [place] [facility] Counselor/Therapist Progress Note  Patient ID: Tavius Turgeon, MRN: [id number],    Date: 04/01/2024 This session was held via video teletherapy. The patient consented to the video teletherapy and was located in his home during this session. He is aware it is the res ponsibility of the patient to secure confidentiality on his end of the session. The provider was in a private home office for the duration of this session.   The patient arrived on time for her Caregility session  Time Spent: 57 minutes, 2 PM to 2:57 PM  Treatment Type: Individual Therapy Reported Symptoms: anxiety, panic attacks That was a situation earlier in the week when the patient's neighbor was banging on his back door an d window.  There had been situations where the same neighbor had said things and use profanity directed at his wife feeling that the patient and his wife and let their dog use the  bathroom in his yard.  The patient said they are very careful because her dog is old and did not think that happen but did not appreciate how they were handled things.  He said he got angry very quickly and did not handle it the way that he wanted to.  The sit uation has been resolved but the patient has had very clear boundaries with his neighbor. His wife continues to progress well from her surgery.  Physically the patient is doing fairly well.  He had to see one of his brothers this week.  He is spending time with his son and son's family.  He has gotten some retirement/insurance things works out for the most part and is glad to have that settled. We will continue to work on coping skill s for helping with his anxiety and the progression of his Lewy body dementia diagnosis as well as processing his grief. Mental Status Exam: Appearance:  Well Groomed  Behavior: Appropriate Motor: Pt. Reports some instability due to Lewey Body dementia Speech/Language:  Normal Rate Affect: Appropriate Mood: normal Thought process: normal Thought content:  WNL Sensory/Perceptual disturbances:  WNL Orientation: oriented to p erson, place, and situation Attention: Good Concentration: Good Memory: Impaired short term Fund of knowledge:  Good Insight:  Good Judgment:  Good Impulse Control: Good  Risk Assessment: Danger to Self: No Self-injurious Behavior: No Danger to Others: No Duty to Warn:no Physical Aggression / Violence:No  Access to Firearms a concern: No  Gang Involvement:No  Treatment plan: Will employ cognitive behavioral therapy as  well as grief and loss therapy for relief of anxiety and grief.  Goals of grief therapy or to have a healthier response to the loss of his daughter and less sadness as evidenced by patient report in therapy notes as well as to help him feel less overwhelmed by her loss.  Goals are to see a 50% reduction in grief symptoms over the next 6 months.  I will encourage  the use of the patient telling his grief story about his daughter includin g encouraging him to bring pictures and memorabilia to help process his feelings including any feelings of guilt associated with her loss.  We will use cognitive behavioral therapy to help identify and change anxiety producing thoughts and behavior patterns so as to improve his ability to better manage anxiety and stress, and manage thoughts and worrisome thinking contributing to anxiety.  We will also refresh and encourage dialectical  behavior therapy distress tolerance and mindfulness skills with the intention of reducing anxiety by 50% over  the next 6 months. Progress: 30% Interventions: Cognitive Behavioral Therapy  Diagnosis:PTSD, Bi-Polar D/O  Plan: F/U/ appointments scheduled weekly.  Cecile Coder, [facility]                                                                                                                    Cecile Coder, [facility]               Cecile Coder, [facility]               Cecile Coder, [facility]               Cecile Coder, [facility]               Cecile Coder, [facility]               Cecile Coder, [facility]               Cecile Coder, [facility]               Cra ig Kelly Patient, [facility]               Cecile Coder, [facility]               Cecile Coder, [facility]  [facility] Counselor/Therapist Progress Note  Patient ID: Braydan Marriott, MRN: [id number],    Date: 04/01/2024 This session was held via video teletherapy. The patient consented to the video teletherapy and was located in his home during this session. He is aware it is the responsibility of the patient to secure confidentiality on his end of the session. The provider was in a private home office for the duration of th is session.   The patient arrived on time for her Caregility session  Time Spent: 59 minutes, 2 PM to 2:59 PM  Treatment Type: Individual Therapy Reported Symptoms: anxiety, panic attacks The patient said he jokingly said to his wife that he had not had any pain issues in the cervical area and then a few days ago he started having pain in his neck and shoulders.  He now sits related to degenerative disk and has some consistent p ain somewhere in his spine with that but jokingly says it just depends on Wednesday which part of his body is decides to show up and hurt.  He is doing the best that he can.  He is  still walking which he knows is good for him but does not have right now the strength to do as much in the morning as he has done in the past so he is denying that to 3 times a day for consistency.  He is still doing some positive things and that he is stayin g in touch with his brother and other family members.  He is reconnecting with friends from high school and he is which she is enjoying.  One of them has had a similar experience in that she lost her son in the same way the patient lost his daughter.  He said they did not think and Easter in which they set a place for his daughter at the table and put her picture there.  He initially had reservations about doing that he and his wife had  made everyone feel as if she was really there with him.  He still feels that he is grieving in a healthy way.  He and his wife have always taken a trip around both of their birthdays which are 2 days apart.  That is now close to his daughter died so they have started taking some of her rashes with him and sprinkling them where they go as a way of remembrance.  They are deciding what that looks like this year in July.  He does contrac [place] for safety having no thoughts of hurting himself or anyone else. We will continue to work on coping skills for helping with his anxiety and the progression of his Lewy body dementia diagnosis as well as processing his grief. Mental Status Exam: Appearance:  Well Groomed  Behavior: Appropriate Motor: Pt. Reports some instability due to Lewey Body dementia Speech/Language:  Normal Rate Affect: Appropriate Mood: normal Thought  process: normal Thought content:  WNL Sensory/Perceptual disturbances:  WNL Orientation: oriented to person, place, and situation Attention: Good Concentration: Good Memory: Impaired short term Fund of knowledge:  Good Insight:  Good Judgment:  Good Impulse Control: Good  Risk Assessment: Danger to Self: No Self-injurious Behavior: No Danger to  Others: No Duty to Warn:no Physical Aggression / Violence:No  Access [place] o Firearms a concern: No  Gang Involvement:No  Treatment plan: Will employ cognitive behavioral therapy as well as grief and loss therapy for relief of anxiety and grief.  Goals of grief  therapy or to have a healthier response to the loss of his daughter and less sadness as evidenced by patient report in therapy notes as well as to help him feel less overwhelmed by her loss.  Goals are to see a 50% reduction in grief symptoms over the  next 6 months.  I will encourage the use of the patient telling his grief story about his daughter including encouraging him to bring pictures and memorabilia to help process his feelings including any feelings of guilt associated with her loss.  We will use cognitive behavioral therapy to help identify and change anxiety producing thoughts and behavior patterns so as to improve his ability to better manage anxiety and stress, and mana ge thoughts and worrisome thinking contributing to anxiety.  We will also refresh and encourage dialectical behavior therapy distress tolerance and mindfulness skills with the intention of reducing anxiety by 50% over the next 6 months. Progress: 30% Interventions: Cognitive Behavioral Therapy  Diagnosis:PTSD, Bi-Polar D/O  Plan: F/U/ appointments scheduled weekly.  Cecile Coder, [facility]                                                                                                                    Cecile Coder, [facility]  [facility] Counselor/Therapist Progress Note  Patient ID: Mj Willis, MRN: [id number],    Date: 04/01/2024 This session was held via video teletherapy. The patient consented to the video teletherapy and was located in his home during this session. He is aware it is the responsibility of the patient to secure confiden tiality on his end of the session. The provider was in a private home office for the duration of this session.   The patient arrived on time for her Caregility session  Time Spent: 58 minutes, 2 PM to 2:58 PM  Treatment Type: Individual Therapy Reported Symptoms: anxiety, panic attacks A fairly difficult week because it was his daughter's birthday.  He said this year was more difficult than last year but he could not pinpoint wh y.  They did remember her about going out to a nice restaurant which is something her daughter always wanted to do.  He said they were able to laugh and joke about funny things that his  daughter had said or done which is not something they were able to do this time last year so he saw that as healthy grieving.  He knows that the time change threw things off a little bit for everybody and his wife still is not feeling well and his back h as been bothering him.  He was not able to go to his first round of physical therapy this week because his back was bothering him more.  He has been thinking a lot more about his daughter which he feels comfortable with.  He is thankful that it is getting warmer and staying light longer so he can get back to some of the coping skills such as fishing that he has done in the past.   We will continue to work on coping skills for helping  with his anxiety and the progression of his Lewy body dementia diagnosis as well as processing his grief. Mental Status Exam: Appearance:  Well Groomed  Behavior: Appropriate Motor: Pt. Reports some instability due to Lewey Body dementia Speech/Language:  Normal Rate Affect: Appropriate Mood: normal Thought process: normal Thought content:  WNL Sensory/Perceptual disturbances:  WNL Orientation: oriented to person, place,  and situation Attention: Good Concentration: Good Memory: Impaired short term Fund of knowledge:  Good Insight:  Good Judgment:  Good Impulse Control: Good  Risk Assessment: Danger to Self: No Self-injurious Behavior: No Danger to Others: No Duty to Warn:no Physical Aggression / Violence:No  Access to Firearms a concern: No  Gang Involvement:No  Treatment plan: Will employ cognitive behavioral therapy as well as grie f and loss therapy for relief of anxiety and grief.  Goals of grief therapy or to have a healthier response to the loss of his daughter and less sadness as evidenced by patient report in therapy notes as well as to help him feel less overwhelmed by her loss.  Goals are to see a 50% reduction in grief symptoms over the next 6 months.  I will encourage the use of the  patient telling his grief story about his daughter including encouraging  him to bring pictures and memorabilia to help process his feelings including any feelings of guilt associated with her loss.  We will use cognitive behavioral therapy to help identify and change anxiety producing thoughts and behavior patterns so as to improve his ability to better manage anxiety and stress, and manage thoughts and worrisome thinking contributing to anxiety.  We will also refresh and encourage dialectical behavior ther apy  distress tolerance and mindfulness skills with the intention of reducing anxiety by 50% over the next 6 months. Progress: 30% Interventions: Cognitive Behavioral Therapy  Diagnosis:PTSD, Bi-Polar D/O  Plan: F/U/ appointments scheduled weekly.  Cecile Coder, [facility]                                                                                                                    Cecile Coder, [facility]               Cecile Coder, [facility]               Cecile Coder, [facility]  [place] [facility] Counselor/Therapist Progress Note  Patient ID: Baldemar Dady, MRN: [id number],    Date: 04/01/2024 This session was held via video teletherapy. The patient consented to the video teletherapy and was located in his home during this session. He is aware it is the responsibility of the patient to secure confidentiality on his end of the session. The pr ovider was in a private home office for the duration of this session.   The patient arrived on time for her Caregility session  Time Spent: 1:05 PM until 2 PM, 55 minutes Treatment Type: Individual Therapy Reported Symptoms: anxiety, panic attacks The patient said he came in from his walk this morning and saw what he thought was some sort of tic from his wife.  It was not the first time it happened and he wonders if it might be  tardive dyskinesia because of some new medications that she is on.  She has a call into the doctor right now and hopes to hear something back soon.  They have narrowed down to the fact they think most of her discomfort in breathing is coming from the diaphragm and they are looking for alternatives for treatment.  The patient continues to do those things which help him cope including walking, fishing when he can, spending time with his  son and daughter-in-law and grandson.  He also has a way of continued coping with grief he is writing letters to his daughters as a way of expressing his feelings and says that is very  beneficial.  He appears to be grieving in a healthy way but he knows that he to year anniversary of her death is coming up and there is some grief there but says it is not overwhelming.  We will continue to work on coping skills for helping with his an xiety and the progression of his Lewy body dementia diagnosis as well as processing his grief. Mental Status Exam: Appearance:  Well Groomed  Behavior: Appropriate Motor: Pt. Reports some instability due to Lewey Body dementia Speech/Language:  Normal Rate Affect: Appropriate Mood: normal Thought process: normal Thought content:  WNL Sensory/Perceptual disturbances:  WNL Orientation: oriented to person, place, and situati on Attention: Good Concentration: Good Memory: Impaired short term Fund of knowledge:  Good Insight:  Good Judgment:  Good Impulse Control: Good  Risk Assessment: Danger to Self: No Self-injurious Behavior: No Danger to Others: No Duty to Warn:no Physical Aggression / Violence:No  Access to Firearms a concern: No  Gang Involvement:No  Treatment plan: Will employ cognitive behavioral therapy as well as grief and loss [facility] herapy for relief of anxiety and grief.  Goals of grief therapy or to have a healthier response to the loss of his daughter and less sadness as evidenced by patient report in therapy notes as well as to help him feel less overwhelmed by her loss.  Goals are to see a 50% reduction in grief symptoms over the next 6 months.  I will encourage the use of the patient telling his grief story about his daughter including encouraging him to brin g pictures and memorabilia to help process his feelings including any feelings of guilt associated with her loss.  We will use cognitive behavioral therapy to help identify and change anxiety producing thoughts and behavior patterns so as to improve his ability to better manage anxiety and stress, and manage thoughts and worrisome thinking contributing to anxiety.  We  will also refresh and encourage dialectical behavior therapy distress  tolerance  and mindfulness skills with the intention of reducing anxiety by 50% over the next 6 months. Progress: 30% Interventions: Cognitive Behavioral Therapy  Diagnosis:PTSD, Bi-Polar D/O  Plan: F/U/ appointments scheduled weekly.  Cecile Coder, [facility]                                                                                                                    Cecile Coder, [facility]  [facility] Counselor/ Therapist Progress Note  Patient ID: Nader Boys, MRN: [id number],    Date: 04/01/2024 This session was  held via video teletherapy. The patient consented to the video teletherapy and was located in his home during this session. He is aware it is the responsibility of the patient to secure confidentiality on his end of the session. The provider was in a private home office for the duration of this session.   The patient arriv ed on time for her Caregility session  Time Spent: 59 minutes, 2 PM to 2:59 PM  Treatment Type: Individual Therapy Reported Symptoms: anxiety, panic attacks The patient continues to present with physical pain and both his back and his knee.  He started with a new physical therapist and told him that there was a lot of arthritis in both knees and or certain things he cannot do but they had him do that anyway and now for the past 3 d ays he has been in significant knee pain.  He has continued to walk because he knows that is good for him in various ways but says it has been painful.  His wife also was not feeling well but she is going back to the doctor next week to have an ablation and hopes that will bring her some relief.  For the most part he has been at the last couple weeks reaching back out and hearing from some old friends as well as time with his son and so n's family.  He says that his wife's cousin is coming down for a weekend soon and they enjoy time with her and that he will go to his brother's house sometime in the next few weeks.  He recognizes the need for people that he cares about in helping with his depression. We will continue to work on coping skills for helping with his anxiety and the progression of his Lewy body dementia diagnosis as well as processing his grief. Mental St atus Exam: Appearance:  Well Groomed  Behavior: Appropriate Motor: Pt. Reports some instability due to Lewey Body dementia Speech/Language:  Normal Rate Affect: Appropriate Mood: normal Thought process: normal Thought content:  WNL Sensory/Perceptual disturbances:   WNL Orientation: oriented to person, place, and situation Attention: Good Concentration: Good Memory: Impaired short term Fund of knowledge:  Good Insight:   Good Judgment:  Good Impulse Control: Good  Risk Assessment: Danger to Self: No Self-injurious Behavior: No Danger to Others: No Duty to Warn:no Physical Aggression / Violence:No  Access to Firearms a concern: No  Gang Involvement:No  Treatment plan: Will employ cognitive behavioral therapy as well as grief and loss therapy for relief of anxiety and grief.  Goals of grief therapy or to have a healthier response to the lo ss of his daughter and less sadness as evidenced by patient report in therapy notes as well as to help him feel less overwhelmed by her loss.  Goals are to see a 50% reduction in grief symptoms over the next 6 months.  I will encourage the use of the patient telling his grief story about his daughter including encouraging him to bring pictures and memorabilia to help process his feelings including any feelings of guilt associated with h er loss.  We will use cognitive behavioral therapy to help identify and change anxiety producing thoughts and behavior patterns so as to improve his ability to better manage anxiety and stress, and manage thoughts and worrisome thinking contributing to anxiety.  We will  also refresh and encourage dialectical behavior therapy distress tolerance and mindfulness skills with the intention of reducing anxiety by 50% over the next 6 months.  Progress: 30% Interventions: Cognitive Behavioral Therapy  Diagnosis:PTSD, Bi-Polar D/O  Plan: F/U/ appointments scheduled weekly.  Cecile Coder, [facility]                                                                                                                   Cecile Coder, [facility]                Cecile Coder,  [facility]               Cecile Coder, [facility]               Cecile Coder, [facility]  [place] [facility] Counselor/Therapist Progress Note  Patient ID: Tres Grzywacz, MRN: [id number],    Date: 04/01/2024 This session was held via video teletherapy. The patient consented to the video teletherapy and was located in his home during this session. He is aware it is the responsibility of the patient to secure confidentiality on his end of the session. The provider was in a private home office for the duration o f this session.   The patient arrived on time for her Caregility session  Time Spent: 57 minutes, 2 PM to 2:57 PM  Treatment Type: Individual Therapy Reported Symptoms: anxiety, panic  attacks The patient is still having significant back pain.  He cannot sit anywhere for very long and has to be careful where he sits.  There is still some unsteadiness although he did not report any falls over the past 2 weeks.  His biggest concern  now is for his wife.  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  She is meeting with her cardiologist again next week to see what possible treatment is for her.  He still thinks there may be some issue with her diaphragm but she is meeting with the pulmonologist also.  He is doing what he can to help her.  His daughter's birthday is March 11 so he knows  that is a difficult time for both he and his wife next week.  Typically they do something to remember her but because of the wife's current health issues they cannot go to [place] as they had planned so they are talking about what to do to remember and celebrate her birthday.  We will continue to work on coping skills for helping with his anxiety and the progression of his Lewy body dementia diagnosis as well as processing his  grief. Mental Status Exam: Appearance:  Well Groomed  Behavior: Appropriate Motor: Pt. Reports some instability due to Lewey Body dementia Speech/Language:  Normal Rate Affect: Appropriate Mood: normal Thought process: normal Thought content:  WNL Sensory/Perceptual disturbances:  WNL Orientation: oriented to person, place, and situation Attention: Good Concentration: Good Memory: Impaired short term Fund of knowledge :  Good Insight:  Good Judgment:  Good Impulse Control: Good  Risk Assessment: Danger to Self: No Self-injurious Behavior: No Danger to Others: No Duty to Warn:no Physical Aggression / Violence:No  Access to Firearms a concern: No  Gang Involvement:No  Treatment plan: Will employ cognitive behavioral therapy as well as grief and loss therapy for relief of  anxiety and grief.  Goals of grief therapy or to have a healthier r esponse to the loss of his daughter and less sadness as evidenced by patient report in therapy notes as well as to help him feel less overwhelmed by her loss.  Goals are to see a 50% reduction in grief symptoms over the next 6 months.  I will encourage the use of the patient telling his grief story about his daughter including encouraging him to bring pictures and memorabilia to help process his feelings including any feelings of guilt  associated with her loss.  We will use cognitive behavioral therapy to help identify and change anxiety producing thoughts and behavior patterns so as to improve his ability to better manage anxiety and stress, and manage thoughts and worrisome thinking contributing to anxiety.  We will also refresh and encourage dialectical behavior therapy distress tolerance and mindfulness skills  with the intention of reducing anxiety by 50% over the  next 6 months. Progress: 30% Interventions: Cognitive Behavioral Therapy  Diagnosis:PTSD, Bi-Polar D/O  Plan: F/U/ appointments scheduled weekly.  Cecile Coder, [facility]                                                                                                                   Cecile Coder, [facility]  [facility] Counselor/Therapist Progress Note  Patient ID: Hernan Turnage, MRN: [id number],    Date: 5/3 0/2025 This session was held via video teletherapy. The patient consented to the video teletherapy and was located in his home during this session. He is aware it is the responsibility of the patient to secure confidentiality on his end of the session. The provider was in a private home office for the duration of this session.   The patient arrived on time for her Caregility session  Time Spent: 58 minutes, 2 PM to 2:58 PM  Treat ment Type: Individual Therapy Reported Symptoms: anxiety, panic attacks A fairly difficult week because it was his daughter's birthday.  He said this year was more difficult than last year but he could not pinpoint why.  They did remember her about going out to a nice restaurant which is something her daughter always wanted to do.  He said they were able to laugh and joke about funny things that his daughter had said or done which is  not something they were able to do this time last year so he saw that as healthy grieving.  He knows that the time change threw things off a little bit for everybody and his wife still  is not feeling well and his back has been bothering him.  He was not able to go to his first round of physical therapy this week because his back was bothering him more.  He has been thinking a lot more about his daughter which he feels comfortable with.   He is thankful that it is getting warmer and staying light longer so he can get back to some of the coping skills such as fishing that he has done in the past.   We will continue to work on coping skills for helping with his anxiety and the progression of his Lewy body dementia diagnosis as well as processing his grief. Mental Status Exam: Appearance:  Well Groomed  Behavior: Appropriate Motor: Pt. Reports some instability due  to Lewey Body dementia Speech/Language:  Normal Rate Affect: Appropriate Mood: normal Thought process: normal Thought content:  WNL Sensory/Perceptual disturbances:  WNL Orientation: oriented to person, place, and situation Attention: Good Concentration: Good Memory: Impaired short term Fund of knowledge:  Good Insight:  Good Judgment:  Good Impulse Control: Good  Risk Assessment: Danger to Self: No Self-injurious  Behavior: No Danger to Others: No Duty to Warn:no Physical Aggression / Violence:No  Access to Firearms a concern: No  Gang Involvement:No  Treatment plan: Will employ cognitive behavioral therapy as well as grief and loss therapy for relief of anxiety and grief.  Goals of grief therapy or to have a healthier response to the loss of his daughter and less sadness as evidenced by patient report in therapy notes as well as to help h im feel less overwhelmed by her loss.  Goals are to see a 50% reduction in grief symptoms over the next 6 months.  I will encourage the use of the patient telling his grief story about his daughter including encouraging him to bring pictures and memorabilia to help process his feelings including any feelings of guilt associated with her loss.  We will use cognitive  behavioral therapy to help identify and change anxiety producing thought s and behavior patterns so as to improve his ability to better manage anxiety and stress, and manage thoughts and worrisome thinking contributing to anxiety.  We will also refresh and encourage dialectical behavior therapy  distress tolerance and mindfulness skills with the intention of reducing anxiety by 50% over the next 6 months. Progress: 30% Interventions: Cognitive Behavioral Therapy  Diagnosis:PTSD, Bi-Polar D/O  Plan: F/U/  appointments scheduled weekly.  Cecile Coder, [facility]                                                                                                                   Cecile Coder, [facility]               Cecile Coder, [facility]               Cecile Coder, [facility]  [place] [facility] Counselor/Therapist Progress Note  Patient ID: Aqeel Norgaard, MRN: [id number],    Date: 04/01/2024 This session was held  via video teletherapy. The patient consented to the video teletherapy and was located in his home during this session. He is aware it is the responsibility of the patient to secure confidentiality on his end of the session. The provider was in a private home office for the duration of this session.   The patient arrived on time for her Caregility session  Time Spent: 57 minutes, 2 PM to 2:57 PM  Treatment Type: Individual Therapy  Reported Symptoms: anxiety, panic attacks The patient is still having significant back pain.  He cannot sit anywhere for very long and has to be careful where he sits.  There is still some unsteadiness although he did not report any falls over the past 2 weeks.  His biggest concern now is for his wife.  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  She is  meeting with her cardiologist again next week to see what possible treatment is for her.  He still thinks there may be some issue with her diaphragm but she is meeting with the pulmonologist also.  He is doing what he can to help her.  His daughter's birthday is March 11 so he knows that is a difficult time for both he and his wife next week.  Typically they do something to remember her but because of the wife's current health issues  they cannot go to [place] as they had planned so they are talking about what to do to remember and celebrate her birthday.  We will continue to work on coping skills for helping  with his anxiety and the progression of his Lewy body dementia diagnosis as well as processing his grief. Mental Status Exam: Appearance:  Well Groomed  Behavior: Appropriate Motor: Pt. Reports some instability due to Lewey Body dementia Speech/La nguage:  Normal Rate Affect: Appropriate Mood: normal Thought process: normal Thought content:  WNL Sensory/Perceptual disturbances:  WNL Orientation: oriented to person, place, and situation Attention: Good Concentration: Good Memory: Impaired short term Fund of knowledge:  Good Insight:  Good Judgment:  Good Impulse Control: Good  Risk Assessment: Danger to Self: No Self-injurious Behavior: No Danger to Others: N o Duty to Warn:no Physical Aggression / Violence:No  Access to Firearms a concern: No  Gang Involvement:No  Treatment plan: Will employ cognitive behavioral therapy as well as grief and loss therapy for relief of anxiety and grief.  Goals of grief therapy or to have a healthier response to the loss of his daughter and less sadness as evidenced by patient report in therapy notes as well as to help him feel less overwhelmed by her lo ss.  Goals are to see a 50% reduction in grief symptoms over the next 6 months.  I will encourage the use of the patient telling his grief story about his daughter including encouraging him to bring pictures and memorabilia to help process his feelings including any feelings of guilt associated with her loss.  We will use cognitive behavioral therapy to help identify and change anxiety producing thoughts and behavior patterns so as to i mprove his ability to better manage anxiety and stress, and manage thoughts and worrisome thinking contributing to anxiety.  We will also refresh and encourage dialectical behavior therapy distress tolerance and mindfulness skills  with the intention of reducing anxiety by 50% over the next 6 months. Progress: 30% Interventions: Cognitive Behavioral  Therapy  Diagnosis:PTSD, Bi-Polar D/O  Plan: F/U/ appointments scheduled weekly.   Cecile Coder, [facility]                                                                                                                   Cecile Coder, [facility]   Behavioral Health Counselor/Therapist Progress Note  Patient ID: Travonta Gill, MRN: [id number],    Date: 04/01/2024 This session was held via video teletherapy. The patient consented to the video teletherapy and was located in his home during this session. He is  aware it is the responsibility of the patient to secure confidentiality on his end of the  session. The provider was in a private home office for the duration of this session.   The patient arrived on time for her Caregility session  Time Spent: 57 minutes, 2 PM to 2:57 PM  Treatment Type: Individual Therapy Reported Symptoms: anxiety, panic attacks  We will continue to work on coping skills for helping with his anxiety and the  progression of his Lewy body dementia diagnosis as well as processing his grief. Mental Status Exam: Appearance:  Well Groomed  Behavior: Appropriate Motor: Pt. Reports some instability due to Lewey Body dementia Speech/Language:  Normal Rate Affect: Appropriate Mood: normal Thought process: normal Thought content:  WNL Sensory/Perceptual disturbances:  WNL Orientation: oriented to person, place, and situation Attention:  Good Concentration: Good Memory: Impaired short term Fund of knowledge:  Good Insight:  Good Judgment:  Good Impulse Control: Good  Risk Assessment: Danger to Self: No Self-injurious Behavior: No Danger to Others: No Duty to Warn:no Physical Aggression / Violence:No  Access to Firearms a concern: No  Gang Involvement:No  Treatment plan: Will employ cognitive behavioral therapy as well as grief and loss therapy for rel ief of anxiety and grief.  Goals of grief therapy or to have a healthier response to the loss of his daughter and less sadness as evidenced by patient report in therapy notes as well as to help him feel less overwhelmed by her loss.  Goals are to see a 50% reduction in grief symptoms over the next 6 months.  I will encourage the use of the patient telling his grief story about his daughter including encouraging him to bring pictures and  memorabilia to help process his feelings including any feelings of guilt associated with her loss.  We will use cognitive behavioral therapy to help identify and change anxiety producing thoughts and behavior patterns so as to improve his ability to better manage anxiety  and stress, and manage thoughts and worrisome thinking contributing to anxiety.  We will also refresh and encourage dialectical behavior therapy distress tolerance and  mindfulness skills with the intention of reducing anxiety by 50% over the next 6 months. Progress: 30% Interventions: Cognitive Behavioral Therapy  Diagnosis:PTSD, Bi-Polar D/O  Plan: F/U/ appointments scheduled weekly.  Cecile Coder, [facility]                                                                                                                    Cecile Coder, [facility]               Cecile Coder, [facility]               Cecile Coder, [facility]  Cecile Coder, [facility]               Cecile Coder, [facility]               Cecile Coder, [facility]               Cecile Coder, [facility]  [facility] Counselor/Therapist Progress Note  Patient ID: Antwaun Buth, MRN: [id number],    Date: 04/01/2024 This session was held via video teletherapy. The patient conse nted to the video teletherapy and was located in his home during this session. He is aware it is the responsibility of the patient to secure confidentiality on his end of the session. The provider was in a private home office for the duration of this session.   The patient arrived on time for her Caregility session  Time Spent: 39 minutes, 2 PM to 2:39 PM  Treatment Type: Individual Therapy Reported Symptoms: anxiety, panic attac ks The patient was not feeling well today.  He started feeling really earlier in the week having a fever bad headaches body aches.  He felt a little better yesterday but feels a little bit of a resurgence in headaches and body aches and is running a low-grade fever.  His wife is feeling some of the same symptoms.  For the most part he says he is doing well.  They did increase his memory medication because they were starting to see some  increasing decline in short-term memory and he said until recently his long-term memory had been good but he was starting to see a difference in how he used to markers to remember what happened at certain times historically.  He feels the medication has helped and has brightened his mood a little also.  He is still trying to walk every day and fish when he can and has enjoyed watching the [employer] as a good distraction.   He reports that he feels he is managing mood fairly well.  He has an appointment in 2 weeks.  He does contract for safety having no thoughts of hurting himself or anyone else. We  will continue to work on coping skills for helping with his anxiety and the progression of his Lewy body dementia diagnosis as well as processing his grief. Mental Status Exam: Appearance:  Well Groomed  Behavior: Appropriate Motor: Pt. Reports some i nstability due to Lewey Body dementia Speech/Language:  Normal Rate Affect: Appropriate Mood: normal Thought process: normal Thought content:  WNL Sensory/Perceptual disturbances:  WNL Orientation: oriented to person, place, and situation Attention: Good Concentration: Good Memory: Impaired short term Fund of knowledge:  Good Insight:  Good Judgment:  Good Impulse Control: Good  Risk Assessment: Danger to Self: No  Self-injurious Behavior: No Danger to Others: No Duty to Warn:no Physical Aggression / Violence:No  Access to Firearms a concern: No  Gang Involvement:No  Treatment plan: Will employ cognitive behavioral therapy as well as grief and loss therapy for relief of anxiety and grief.  Goals of grief therapy or to have a healthier response to the loss of his daughter and less sadness as evidenced by patient report in therapy notes as wel l as to help him feel less overwhelmed by her loss.  Goals are to see a 50% reduction in grief symptoms over the next 6 months.  I will encourage the use of the patient telling his grief story about his daughter including encouraging him to bring pictures and memorabilia to help process his feelings including any feelings of guilt associated with her loss.  We will use cognitive behavioral therapy to help identify and change anxiety pro ducing thoughts and behavior patterns so as to improve his ability to better manage anxiety and stress, and manage thoughts and worrisome thinking contributing to anxiety.  We will also refresh and  encourage dialectical behavior therapy distress tolerance and mindfulness skills with the intention of reducing anxiety by 50% over the next 6 months. Progress:  30% Interventions: Cognitive Behavioral Therapy  Diagnosis:PTSD, Bi-Polar D/O   Plan: F/U/ appointments scheduled weekly.  Cecile Coder, [facility]                                                                                                                   Cecile Coder, [facility]  [place] [facility] Counselor/Therapist Progress Note  Patient ID: Yuuki Skeens, MRN: [id number],    Date: 04/01/2024 This session was held via video teletherapy. The patient consented to the video teletherapy and was  located in his home during this session. He is aware it is the responsibility of the patient to secure  confidentiality on his end of the session. The provider was in a private home office for the duration of this session.   The patient arrived on time for her Caregility session  Time Spent: 58 minutes, 2 PM to 2:58 PM  Treatment Type: Individual Therapy Reported Symptoms: anxiety, panic attacks A fairly difficult week because i Stephen Myers was his daughter's birthday.  He said this year was more difficult than last year but he could not pinpoint why.  They did remember her about going out to a nice restaurant which is something her daughter always wanted to do.  He said they were able to laugh and joke about funny things that his daughter had said or done which is not something they were able to do this time last year so he saw that as healthy grieving.  He knows that Stephen Myers he time change threw things off a little bit for everybody and his wife still is not feeling well and his back has been bothering him.  He was not able to go to his first round of physical therapy this week because his back was bothering him more.  He has been thinking a lot more about his daughter which he feels comfortable with.  He is thankful that it is getting warmer and staying light longer so he can get back to some of the coping  skills such as fishing that he has done in the past.   We will continue to work on coping skills for helping with his anxiety and the progression of his Lewy body dementia diagnosis as well as processing his grief. Mental Status Exam: Appearance:  Well Groomed  Behavior: Appropriate Motor: Pt. Reports some instability due to Lewey Body dementia Speech/Language:  Normal Rate Affect: Appropriate Mood: normal Thought process:  normal Thought content:  WNL Sensory/Perceptual disturbances:  WNL Orientation: oriented to person, place, and situation Attention: Good Concentration: Good Memory: Impaired short term Fund of knowledge:  Good Insight:  Good Judgment:  Good Impulse Control: Good  Risk  Assessment: Danger to Self: No Self-injurious Behavior: No Danger to Others: No Duty to Warn:no Physical Aggression / Violence:No  Access to Firearm s a concern: No  Gang Involvement:No  Treatment plan: Will employ cognitive behavioral therapy as well as grief and loss therapy for relief of anxiety and grief.  Goals of grief therapy or to have a healthier response to the loss of his daughter and less sadness as evidenced by patient report in therapy notes as well as to help him feel less overwhelmed by her loss.  Goals are to see a 50% reduction in grief symptoms over the next 6 m onths.  I will encourage the use of the patient telling his grief story about his daughter including encouraging him to bring pictures and memorabilia to help process his feelings including any feelings of guilt associated with her loss.  We will use cognitive behavioral therapy to help identify and change anxiety producing thoughts and behavior patterns so as to improve his ability to better manage anxiety and stress, and manage though ts and worrisome thinking contributing to anxiety.  We will also refresh and encourage dialectical behavior therapy  distress tolerance and mindfulness skills with the intention of reducing anxiety by 50% over the next 6 months. Progress: 30% Interventions: Cognitive Behavioral Therapy  Diagnosis:PTSD, Bi-Polar D/O  Plan: F/U/ appointments scheduled weekly.  Cecile Coder, [facility]                                                                                                                    Cecile Coder, [facility]               Cecile Coder, [facility]               Cecile Coder, [facility]  [place] [facility] Counselor/Therapist Progress Note  Patient ID: Kaelem Brach, MRN: [id number],    Date: 04/01/2024 This session was held via video teletherapy. The patient consented to the video teletherapy and was located in his home during this session.  He is aware it is the responsibility of the patient to secure confidentiality on his end of the session. The provider was in a private home office for the duration of this session.   The patient arrived on time for her Caregility session  Time Spent: 1:05 PM until 2 PM, 55 minutes Treatment Type: Individual Therapy Reported Symptoms: anxiety, panic attacks The patient said he came in from his walk this morning and saw what he Stephen Myers hought was some sort of tic from his wife.  It was not the first time it happened and he wonders if it might be tardive dyskinesia because of some new medications that she is on.  She  has a call into the doctor right now and hopes to hear something back soon.  They have narrowed down to the fact they think most of her discomfort in breathing is coming from the diaphragm and they are looking for alternatives for treatment.  The patient  continues to do those things which help him cope including walking, fishing when he can, spending time with his son and daughter-in-law and grandson.  He also has a way of continued coping with grief he is writing letters to his daughters as a way of expressing his feelings and says that is very beneficial.  He appears to be grieving in a healthy way but he knows that he to year anniversary of her death is coming up and there is some g rief there but says it is not overwhelming.  We will continue to work on coping skills for helping with his anxiety and the progression of his Lewy body dementia diagnosis as well as processing his grief. Mental Status Exam: Appearance:  Well Groomed  Behavior: Appropriate Motor: Pt. Reports some instability due to Lewey Body dementia Speech/Language:  Normal Rate Affect: Appropriate Mood: normal Thought process: normal Tho ught content:  WNL Sensory/Perceptual disturbances:  WNL Orientation: oriented to person, place, and situation Attention: Good Concentration: Good Memory: Impaired short term Fund of knowledge:  Good Insight:  Good Judgment:  Good Impulse Control: Good  Risk Assessment: Danger to Self: No Self-injurious Behavior: No Danger to Others: No Duty to Warn:no Physical Aggression / Violence:No  Access to Firearms a concern:  No  Gang Involvement:No  Treatment plan: Will employ cognitive behavioral therapy as well as grief and loss therapy for relief of anxiety and grief.  Goals of grief therapy or to have a healthier response to the loss of his daughter and less sadness as evidenced by patient report in therapy notes as well as to help him feel less overwhelmed by her loss.  Goals are  to see a 50% reduction in grief symptoms over the next 6 months.  I wi ll encourage the use of the patient telling his grief story about his daughter including encouraging him to bring pictures and memorabilia to help process his feelings including any feelings of guilt associated with her loss.  We will use cognitive behavioral therapy to help identify and change anxiety producing thoughts and behavior patterns so as to improve his ability to better manage anxiety and stress, and manage thoughts and worri some thinking contributing to anxiety.  We will also refresh and encourage dialectical behavior therapy distress tolerance  and mindfulness skills with the intention of reducing anxiety by 50% over the next 6 months. Progress: 30% Interventions: Cognitive Behavioral Therapy  Diagnosis:PTSD, Bi-Polar D/O  Plan: F/U/ appointments scheduled weekly.  Cecile Coder, [facility]                                                                                                                    Cecile Coder, [facility]   Behavioral Health Counselor/Therapist Progress Note  Patient ID: Sneijder Bernards, MRN: [id number],    Date: 04/01/2024 This session was held via video teletherapy. The patient consented to the video teletherapy and was located in his home during this session. He is aware it is the responsibility of the patient to secure confidentiality on his end of  the session. The provider was in a private home office for the duration of this session.   The patient arrived on time for her Caregility session  Time Spent: 59 minutes, 2 PM to 2:59 PM  Treatment Type: Individual Therapy Reported Symptoms: anxiety, panic attacks The patient continues to present with physical pain and both his back and his knee.  He started with a new physical therapist and told him that there was a lot of art hritis in both knees and or certain things he cannot do but they had him do that anyway and now for the past 3 days he has been in significant knee pain.  He has continued to walk because he knows that is good for him in various ways but says it has been painful.  His wife also was not feeling well but she is going back to the doctor next week to have an ablation and hopes that will bring her some relief.  For the most part he has been  at the last couple weeks reaching back out and hearing from some old friends as well as time with his son and son's family.  He says that his wife's cousin is coming down for a weekend  soon and they enjoy time with her and that he will go to his brother's house sometime in the next few weeks.  He recognizes the need for people that he cares about in helping with his depression. We will continue to work on coping skills for helping with  his anxiety and the progression of his Lewy body dementia diagnosis as well as processing his grief. Mental Status Exam: Appearance:  Well Groomed  Behavior: Appropriate Motor: Pt. Reports some instability due to Lewey Body dementia Speech/Language:  Normal Rate Affect: Appropriate Mood: normal Thought process: normal Thought content:  WNL Sensory/Perceptual disturbances:  WNL Orientation: oriented to person, place, and  situation Attention: Good Concentration: Good Memory: Impaired short term Fund of knowledge:  Good Insight:  Good Judgment:  Good Impulse Control: Good  Risk Assessment: Danger to Self: No Self-injurious Behavior: No Danger to Others: No Duty to Warn:no Physical Aggression / Violence:No  Access to Firearms a concern: No  Gang Involvement:No  Treatment plan: Will employ cognitive behavioral therapy as well as grief and  loss therapy for relief of anxiety and grief.  Goals of grief therapy or to have a healthier response to the loss of his daughter and less sadness as evidenced by patient report in therapy notes as well as to help him feel less overwhelmed by her loss.  Goals are to see a 50% reduction in grief symptoms over the next 6 months.  I will encourage the use of the patient telling his grief story about his daughter including encouraging him  to bring pictures and memorabilia to help process his feelings including any feelings of guilt associated with her loss.  We will use cognitive behavioral therapy to help identify and change anxiety producing thoughts and behavior patterns so as to improve his ability to better manage anxiety and stress, and manage thoughts and worrisome thinking contributing to  anxiety.  We will also  refresh and encourage dialectical behavior therapy d istress tolerance and mindfulness skills with the intention of reducing anxiety by 50% over the next 6 months. Progress: 30% Interventions: Cognitive Behavioral Therapy  Diagnosis:PTSD, Bi-Polar D/O  Plan: F/U/ appointments scheduled weekly.  Cecile Coder, [facility]                                                                                                                    Cecile Coder, [facility]               Cecile Coder, [facility]               Cecile Coder, [facility]               Cecile Coder, [facility]  [place] [facility] Counselor/Therapist Progress Note  Patient ID: Rodrigo Mcgranahan, MRN: [id number],    Date: 04/01/2024 This session was held via video teletherapy. The patient consented to the video teletherapy and was located in his home during this session. He is aware it is the responsibility of the patient Stephen Myers o secure confidentiality on his end of the session. The provider was in a private home office for the duration of this session.   The patient arrived on time for her Caregility session  Time Spent: 57 minutes, 2 PM to 2:57 PM  Treatment Type: Individual Therapy Reported Symptoms: anxiety, panic attacks The patient is still having significant back pain.  He cannot sit anywhere for very long and has to be careful where he sits.  Stephen Myers here is still some unsteadiness although he did not report any falls over the past 2 weeks.  His biggest concern now is for his wife.  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  She is meeting with her cardiologist again next week to see what possible treatment is for her.  He still thinks there may be some issue with her diaphragm but she is meeting with  the pulmonologist also.  He is doing what he can to help her.  His daughter's birthday is March 11 so he knows that is a difficult time for both he and his wife next week.  Typically they do something to remember her but because of the wife's current health issues they cannot go to [place] as they had planned so they are talking about what to do to remember and celebrate her birthday.  We will continue to work on coping skills  for helping with his anxiety and the progression of his Lewy body dementia diagnosis as well as processing his grief. 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 Speech/Language:  Normal Rate Affect: Appropriate Mood: normal Thought process: normal Thought content:  WNL Sensory/Perceptual disturbances:  WNL Orientation: oriented to pe rson, place, and situation Attention: Good Concentration: Good Memory: Impaired short term Fund of knowledge:  Good Insight:  Good Judgment:  Good Impulse Control: Good  Risk Assessment: Danger to Self: No Self-injurious Behavior: No Danger to Others: No Duty to Warn:no Physical Aggression / Violence:No  Access to Firearms a concern: No  Gang Involvement:No  Treatment plan: Will employ cognitive behavioral therapy as  well as grief and loss therapy for relief of anxiety and grief.  Goals of grief therapy or to have a healthier response to the loss of his daughter and less sadness as evidenced by patient report in therapy notes as well as to help him feel less overwhelmed by her loss.  Goals are to see a 50% reduction in grief symptoms over the next 6 months.  I will encourage the use of the patient telling his grief story about his daughter including  encouraging him to bring pictures and memorabilia to help process his feelings including any feelings of guilt associated with her loss.  We will use cognitive behavioral therapy to help identify and change anxiety producing thoughts and behavior patterns so as to improve his ability to better manage anxiety and stress, and manage thoughts and worrisome thinking contributing to anxiety.  We will also refresh and encourage dialectical b ehavior therapy distress tolerance and mindfulness skills  with the intention of reducing anxiety by 50% over the next 6 months. Progress: 30% Interventions: Cognitive Behavioral Therapy  Diagnosis:PTSD, Bi-Polar D/O  Plan: F/U/ appointments scheduled weekly.  Cecile Coder,  [facility]                                                                                                                    Cecile Coder, [facility]  [place] [facility] Counselor/Therapist Progress Note  Patient ID: Bobbi Kozakiewicz, MRN: [id number],    Date: 04/01/2024 This session was held via video teletherapy. The patient consented to the video teletherapy and was located in his home during this session. He is aware it is the responsibility of the patient to secure confidentiality on his end of the session. The provider was in a private home office for the duration of this session .   The patient arrived  on time for her Caregility session  Time Spent: 58 minutes, 2 PM to 2:58 PM  Treatment Type: Individual Therapy Reported Symptoms: anxiety, panic attacks A fairly difficult week because it was his daughter's birthday.  He said this year was more difficult than last year but he could not pinpoint why.  They did remember her about going out to a nice restaurant which is something her daughter always wanted  to do.  He said they were able to laugh and joke about funny things that his daughter had said or done which is not something they were able to do this time last year so he saw that as healthy grieving.  He knows that the time change threw things off a little bit for everybody and his wife still is not feeling well and his back has been bothering him.  He was not able to go to his first round of physical therapy this week because his ba ck was bothering him more.  He has been thinking a lot more about his daughter which he feels comfortable with.  He is thankful that it is getting warmer and staying light longer so he can get back to some of the coping skills such as fishing that he has done in the past.   We will continue to work on coping skills for helping with his anxiety and the progression of his Lewy body dementia diagnosis as well as processing his grief. M ental Status Exam: Appearance:  Well Groomed  Behavior: Appropriate Motor: Pt. Reports some instability due to Lewey Body dementia Speech/Language:  Normal Rate Affect: Appropriate Mood: normal Thought process: normal Thought content:  WNL Sensory/Perceptual disturbances:  WNL Orientation: oriented to person, place, and situation Attention: Good Concentration: Good Memory: Impaired short term Fund of knowledge:  Good  Insight:  Good Judgment:  Good Impulse Control: Good  Risk Assessment: Danger to Self: No Self-injurious Behavior: No Danger to Others: No Duty to Warn:no Physical Aggression / Violence:No  Access to  Firearms a concern: No  Gang Involvement:No  Treatment plan: Will employ cognitive behavioral therapy as well as grief and loss therapy for relief of anxiety and grief.  Goals of grief therapy or to have a healthier response Stephen Myers o the loss of his daughter and less sadness as evidenced by patient report in therapy notes as well as to help him feel less overwhelmed by her loss.  Goals are to see a 50% reduction in grief symptoms over the next 6 months.  I will encourage the use of the patient telling his grief story about his daughter including encouraging him to bring pictures and memorabilia to help process his feelings including any feelings of guilt associate d with her loss.  We will use cognitive behavioral therapy to help identify and change anxiety producing thoughts and behavior patterns so as to improve his ability to better manage anxiety and stress, and manage thoughts and worrisome thinking contributing to anxiety.  We will also refresh and encourage dialectical behavior therapy  distress tolerance and mindfulness skills with the intention of reducing anxiety by 50% over the next 6 m onths. Progress: 30% Interventions: Cognitive Behavioral Therapy  Diagnosis:PTSD, Bi-Polar D/O  Plan: F/U/ appointments scheduled weekly.  Cecile Coder, [facility]                                                                                                                   Cecile Coder, [facility]                Cecile Coder, [facility]               Cecile Coder,  [facility]  [facility] Counselor/Thera pist Progress Note  Patient ID: Travontae Freiberger, MRN: [id number],    Date: 04/01/2024 This session was held via video teletherapy. The patient consented to the video teletherapy and was located in his home during this session. He is aware it is the responsibility of the patient to secure confidentiality on his end of the session. The provider was in a private home office for the duration of this session.   The patient arrived on  time for her Caregility session  Time Spent: 57 minutes, 2 PM to 2:57 PM  Treatment Type: Individual Therapy Reported Symptoms: anxiety, panic attacks The patient is still having significant back pain.  He cannot sit anywhere for very long and has to be  careful where he sits.  There is still some unsteadiness although he did not report any falls over the past 2 weeks.  His biggest concern now is for his wife.  She has been having  some difficulty breathing and they did a test so they found a small hole on the back side of her heart.  She is meeting with her cardiologist again next week to see what possible treatment is for her.  He still thinks there may be some issue with her diaphragm but she is meeting with the pulmonologist also.  He is doing what he can to help her.  His daughter's birthday is March 11 so he knows that is a difficult time for both he and h is wife next week.  Typically they do something to remember her but because of the wife's current health issues they cannot go to [place] as they had planned so they are talking about what to do to remember and celebrate her birthday.  We will continue to work on coping skills for helping with his anxiety and the progression of his Lewy body dementia diagnosis as well as processing his grief. Mental Status Exam: Appearance:  W ell Groomed  Behavior: Appropriate Motor: Pt. Reports some instability due to Lewey Body dementia Speech/Language:  Normal Rate Affect: Appropriate Mood: normal Thought process: normal Thought content:  WNL Sensory/Perceptual disturbances:  WNL Orientation: oriented to person, place, and situation Attention: Good Concentration: Good Memory: Impaired short term Fund of knowledge:  Good Insight:  Good Judgment:  Good  Impulse Control: Good  Risk Assessment: Danger to Self: No Self-injurious Behavior: No Danger to Others: No Duty to Warn:no Physical Aggression / Violence:No  Access to Firearms a concern: No  Gang Involvement:No  Treatment plan: Will employ cognitive behavioral therapy as well as grief and loss therapy for relief of anxiety and grief.  Goals of grief therapy or to have a healthier response to the loss of his daughter and les s  sadness as evidenced by patient report in therapy notes as well as to help him feel less overwhelmed by her loss.  Goals are to see a 50% reduction in grief symptoms over the next 6 months.  I will encourage the use of the patient telling his grief story about his daughter including encouraging him to bring pictures and memorabilia to help process his feelings including any feelings of guilt associated with her loss.  We will use cogn itive behavioral therapy to help identify and change anxiety producing thoughts and behavior patterns so as to improve his ability to better manage anxiety and stress, and manage thoughts and worrisome thinking contributing to anxiety.  We will also refresh and encourage dialectical behavior therapy distress tolerance and mindfulness  skills with the intention of reducing anxiety by 50% over the next 6 months. Progress: 30% Interventio ns: Cognitive Behavioral Therapy  Diagnosis:PTSD, Bi-Polar D/O  Plan: F/U/ appointments scheduled weekly.  Cecile Coder, [facility]                                                                                                                   Cecile Coder, [facility]  [facility] [facility] Counselor/Therapist Progress Note  Patient ID: Riot Waterworth, MRN: [id number],    Date: 04/01/2024 This session was held via video tel etherapy. The patient consented to the video teletherapy and was located in his home during this session. He is aware it is the responsibility of the patient to secure confidentiality on his end of the session. The provider was in a private home office for the duration of this session.   The patient arrived on time for her Caregility session  Time Spent: 57 minutes, 2 PM to 2:57 PM  Treatment Type: Individual Therapy Reported Sym ptoms: anxiety, panic attacks  The patient reported that he had not been sleeping well so he did speak with his psychiatrist about adjusting some meds.  Initially they adjusted too much up and he said he felt groggy for the entire next day but they have found a combination now where he feels like he is getting more rested and only feels a little drowsy shortly after waking up..  There have been a couple times where he stumbled and one  of the times he felt down but did not get hurt the other times he was able to catch himself.  He is trying to stay as active as he can walking daily and fishing with his wife when he  can.  His biggest anxiety now is his wife has an upcoming surgery on her diaphragm in 2 weeks so he knows that will be a fairly significant recovery time but also sees the difficulties she is having now and wants her to get some relief..  His son and daugh ter-in-law will be supports for them also.  He continues to speak with friends and family members as encouragement.  He also feels that he is grieving appropriately writing to his daughter on a regular basis about the things that are going on in his and his wife's lives.  We will continue to work on coping skills for helping with his anxiety and the progression of his Lewy body dementia diagnosis as well as processing his grief. Ment al Status Exam: Appearance:  Well Groomed  Behavior: Appropriate Motor: Pt. Reports some instability due to Lewey Body dementia Speech/Language:  Normal Rate Affect: Appropriate Mood: normal Thought process: normal Thought content:  WNL Sensory/Perceptual disturbances:  WNL Orientation: oriented to person, place, and situation Attention: Good Concentration: Good Memory: Impaired short term Fund of knowledge:  Good Ins ight:  Good Judgment:  Good Impulse Control: Good  Risk Assessment: Danger to Self: No Self-injurious Behavior: No Danger to Others: No Duty to Warn:no Physical Aggression / Violence:No  Access to Firearms a concern: No  Gang Involvement:No  Treatment plan: Will employ cognitive behavioral therapy as well as grief and loss therapy for relief of anxiety and grief.  Goals of grief therapy or to have a healthier response to Stephen Myers he loss of his daughter and less sadness as evidenced by patient report in therapy notes as well as to help him feel less overwhelmed by her loss.  Goals are to see a 50% reduction in grief symptoms over the next 6 months.  I will encourage the use of the patient telling his grief story about his daughter including encouraging him to bring pictures and memorabilia to  help process his feelings including any feelings of guilt associated w ith her loss.  We will use cognitive behavioral therapy to help identify and change anxiety producing thoughts and behavior patterns so as to improve his ability  to better manage anxiety and stress, and manage thoughts and worrisome thinking contributing to anxiety.  We will also refresh and encourage dialectical behavior therapy distress tolerance and mindfulness skills with the intention of reducing anxiety by 50% over the next 6 mont hs. Progress: 30% Interventions: Cognitive Behavioral Therapy  Diagnosis:PTSD, Bi-Polar D/O  Plan: F/U/ appointments scheduled weekly.  Cecile Coder, [facility]                                                                                                                   Cecile Coder, [facility]                Cecile Coder, [facility]               Cecile Coder, [facility]               Cecile Coder, [facility]               Cecile Coder, [facility]               Cecile Coder, [facility]               Cecile Coder, [facility]               Cecile Coder, [facility]  [place] [facility] Counselor/Therapist Progress Note  Patient ID: Marti Acebo, MRN: [id number],    Date: 04/01/2024 This session was held via video teletherapy. The patient consented to the video teletherapy and w as located in his home during this session. He is aware it is the responsibility of the patient to secure confidentiality on his end of the session. The provider was in a private home office for the duration of this session.   The patient arrived on time for her Caregility session  Time Spent: 59 minutes, 2 PM to 2:59 PM  Treatment Type: Individual Therapy Reported Symptoms: anxiety, panic attacks The patient said he jokingly sa id to his wife that he had not had any pain issues in the cervical area and then a few days ago he started having pain in his neck and shoulders.  He now sits related to degenerative disk and has some consistent pain somewhere in his spine with that but jokingly says it just depends on Wednesday which part of his body is decides to show up and hurt.  He is doing the best that he can.  He is still walking which he knows is good for him b ut does not have right now the strength to do as much in the morning as he has done in the past so he is denying that to 3 times a day for consistency.  He is still doing some positive  things and that he is staying in touch with his brother and other family members.  He is reconnecting with friends from high school and he is which she is enjoying.  One of them has had a similar experience in that she lost her son in the same way the pat ient lost his daughter.  He said they did not think and Easter in which they set a place for his daughter at the table and put her picture there.  He initially had reservations about doing that he and his wife had made everyone feel as if she was really there with him.  He still feels that he is grieving in a healthy way.  He and his wife have always taken a trip around both of their birthdays which are 2 days apart.  That is now close  to his daughter died so they have started taking some of her rashes with him and sprinkling them where they go as a way of remembrance.  They are deciding what that looks like this year in July.  He does contract for safety having no thoughts of hurting himself or anyone else. We will continue to work on coping skills for helping with his anxiety and the progression of his Lewy body dementia diagnosis as well as processing his grief.  Mental Status Exam: Appearance:  Well Groomed  Behavior: Appropriate Motor: Pt. Reports some instability due to Lewey Body dementia Speech/Language:  Normal Rate Affect: Appropriate Mood: normal Thought process: normal Thought content:  WNL Sensory/Perceptual disturbances:  WNL Orientation: oriented to person, place, and situation Attention: Good Concentration: Good Memory: Impaired short term Fund of knowledge:  Goo d Insight:  Good Judgment:  Good Impulse Control: Good  Risk Assessment: Danger to Self: No Self-injurious Behavior: No Danger to Others: No Duty to Warn:no Physical Aggression / Violence:No  Access to Firearms a concern: No  Gang Involvement:No  Treatment plan: Will employ cognitive behavioral therapy as well as grief and loss therapy for relief of  anxiety and grief.  Goals of grief therapy  or to have a healthier respons e to the loss of his daughter and less sadness as evidenced by patient report in therapy notes as well as to help him feel less overwhelmed by her loss.  Goals are to see a 50% reduction in grief symptoms over the next 6 months.  I will encourage the use of the patient telling his grief story about his daughter including encouraging him to bring pictures and memorabilia to help process his feelings including any feelings of guilt associ ated with her loss.  We will use cognitive behavioral therapy to help identify and change anxiety producing thoughts and behavior patterns so as to improve his ability to better manage anxiety and stress, and manage thoughts and worrisome thinking contributing to anxiety.  We will also refresh and encourage dialectical behavior therapy distress tolerance and mindfulness skills with the intention of reducing anxiety by 50% over the next  6 months. Progress: 30% Interventions: Cognitive Behavioral Therapy  Diagnosis:PTSD, Bi-Polar D/O  Plan: F/U/ appointments scheduled weekly.  Cecile Coder, [facility]                                                                                                                   Cecile Coder, [facility]  [place] [facility] Counselor/Therapist Progress Note  Patient ID: Isiaih Hollenbach, MRN: [id number],    Date: 04/01/2024  This session was held via video teletherapy. The patient consented to the video teletherapy and was located in his home during this session. He is aware it is the responsibility of the patient to secure confidentiality on his end of the session. The provider was in a private home office for the duration of this session.   The patient arrived on time for her Caregility session  Time Spent: 58 minutes, 2 PM to 2:58 PM  Treatment Stephen Myers ype: Individual Therapy Reported Symptoms: anxiety, panic attacks A fairly difficult week because it was his daughter's birthday.  He said this year was more difficult than last year but he could not pinpoint why.  They did remember her about going out to a nice restaurant which is something her daughter always wanted to do.  He said they were able to laugh and joke about funny things that his daughter had said or done which is not so mething they were able to do this time last year so he saw that as healthy grieving.  He knows that the time change threw things off a little bit for everybody and his wife still is not  feeling well and his back has been bothering him.  He was not able to go to his first round of physical therapy this week because his back was bothering him more.  He has been thinking a lot more about his daughter which he feels comfortable with.  He is  thankful that it is getting warmer and staying light longer so he can get back to some of the coping skills such as fishing that he has done in the past.   We will continue to work on coping skills for helping with his anxiety and the progression of his Lewy body dementia diagnosis as well as processing his grief. Mental Status Exam: Appearance:  Well Groomed  Behavior: Appropriate Motor: Pt. Reports some instability due to Le wey Body dementia Speech/Language:  Normal Rate Affect: Appropriate Mood: normal Thought process: normal Thought content:  WNL Sensory/Perceptual disturbances:  WNL Orientation: oriented to person, place, and situation Attention: Good Concentration: Good Memory: Impaired short term Fund of knowledge:  Good Insight:  Good Judgment:  Good Impulse Control: Good  Risk Assessment: Danger to Self: No Self-injurious Behav ior: No Danger to Others: No Duty to Warn:no Physical Aggression / Violence:No  Access to Firearms a concern: No  Gang Involvement:No  Treatment plan: Will employ cognitive behavioral therapy as well as grief and loss therapy for relief of anxiety and grief.  Goals of grief therapy or to have a healthier response to the loss of his daughter and less sadness as evidenced by patient report in therapy notes as well as to help him fee l less overwhelmed by her loss.  Goals are to see a 50% reduction in grief symptoms over the next 6 months.  I will encourage the use of the patient telling his grief story about his daughter including encouraging him to bring pictures and memorabilia to help process his feelings including any feelings of guilt associated with her loss.  We will use cognitive  behavioral therapy to help identify and change anxiety producing thoughts and  behavior patterns so as to improve his ability to better manage anxiety and stress, and manage thoughts and worrisome thinking contributing to anxiety.  We will also refresh and encourage dialectical behavior therapy  distress tolerance and mindfulness skills with the intention of reducing anxiety by 50% over the next 6 months. Progress: 30% Interventions: Cognitive Behavioral Therapy  Diagnosis:PTSD, Bi-Polar D/O  Plan: F/U/ appoi ntments scheduled weekly.  Cecile Coder, [facility]                                                                                                                   Cecile Coder, [facility]               Cecile Coder, [facility]               Cecile Coder, [facility]  [place] [facility] Counselor/Therapist Progress Note  Patient ID: Casyn Becvar, MRN: [id number],    Date: 04/01/2024 This session was held via video telethe rapy. The patient consented to the video teletherapy and was located in his home during this session. He is aware it is the responsibility of the patient to secure confidentiality on his end of the session. The provider was in a private home office for the duration of this session.   The patient arrived on time for her Caregility session  Time Spent: 1:05 PM until 2 PM, 55 minutes Treatment Type: Individual Therapy Reported Sympt oms: anxiety, panic attacks The patient said he came in from his walk this morning and saw what he thought was some sort of tic from his wife.  It was not the first time it happened and he wonders if it might be tardive dyskinesia because of some new medications that she is on.  She has a call into the doctor right now and hopes to hear something back soon.  They have narrowed down to the fact they think most of her discomfort in breat hing is coming from the diaphragm and they are looking for alternatives for treatment.  The patient continues to do those things which help him cope including walking, fishing when he can, spending time with his son and daughter-in-law and grandson.  He also has a way of continued coping with grief he is writing letters to his daughters as a way of expressing his feelings and says that is very beneficial.  He appears to be grieving in  a healthy way but he knows that he to year anniversary of her death is coming up and there is some grief there but says it is not overwhelming.  We will continue to work on coping  skills for helping with his anxiety and the progression of his Lewy body dementia diagnosis as well as processing his grief. Mental Status Exam: Appearance:  Well Groomed  Behavior: Appropriate Motor: Pt. Reports some instability due to Lewey Body dem entia Speech/Language:  Normal Rate Affect: Appropriate Mood: normal Thought process: normal Thought content:  WNL Sensory/Perceptual disturbances:  WNL Orientation: oriented to person, place, and situation Attention: Good Concentration: Good Memory: Impaired short term Fund of knowledge:  Good Insight:  Good Judgment:  Good Impulse Control: Good  Risk Assessment: Danger to Self: No Self-injurious Behavior: No Dan ger to Others: No Duty to Warn:no Physical Aggression / Violence:No  Access to Firearms a concern: No  Gang Involvement:No  Treatment plan: Will employ cognitive behavioral therapy as well as grief and loss therapy for relief of anxiety and grief.  Goals of grief therapy or to have a healthier response to the loss of his daughter and less sadness as evidenced by patient report in therapy notes as well as to help him feel less overw helmed by her loss.  Goals are to see a 50% reduction in grief symptoms over the next 6 months.  I will encourage the use of the patient telling his grief story about his daughter including encouraging him to bring pictures and memorabilia to help process his feelings including any feelings of guilt associated with her loss.  We will use cognitive behavioral therapy to help identify and change anxiety producing thoughts and behavior pat terns so as to improve his ability to better manage anxiety and stress, and manage thoughts and worrisome thinking contributing to anxiety.  We will also refresh and encourage dialectical behavior therapy distress tolerance and  mindfulness skills with the intention of reducing anxiety by 50% over the next 6 months. Progress: 30% Interventions: Cognitive Behavioral  Therapy  Diagnosis:PTSD, Bi-Polar D/O  Plan: F/U/ appointments sche duled weekly.  Cecile Coder, [facility]                                                                                                                   Cecile Coder, [facility]  [place] [facility] Counselor/Therapist Progress Note  Patient ID: Bain Whichard, MRN: [id number],    Date: 04/01/2024 This session was held via video teletherapy. The patient consented to the video teletherapy and was located in his home during this  session. He is aware it is the responsibility of the patient to secure confidentiality on his end of the  session. The provider was in a private home office for the duration of this session.   The patient arrived on time for her Caregility session  Time Spent: 59 minutes, 2 PM to 2:59 PM  Treatment Type: Individual Therapy Reported Symptoms: anxiety, panic attacks The patient continues to present with physical pain and both his  back and his knee.  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  He has continued to walk because he knows that is good for him in various ways but says it has been painful.  His wife also was not feeling well but she is going back to the doctor nex [place] week to have an ablation and hopes that will bring her some relief.  For the most part he has been at the last couple weeks reaching back out and hearing from some old friends as well as time with his son and son's family.  He says that his wife's cousin is coming down for a weekend soon and they enjoy time with her and that he will go to his brother's house sometime in the next few weeks.  He recognizes the need for people that he ca res about in helping with his depression. We will continue to work on coping skills for helping with his anxiety and the progression of his Lewy body dementia diagnosis as well as processing his grief. Mental Status Exam: Appearance:  Well Groomed  Behavior: Appropriate Motor: Pt. Reports some instability due to Lewey Body dementia Speech/Language:  Normal Rate Affect: Appropriate Mood: normal Thought process: normal Thought  content:  WNL Sensory/Perceptual disturbances:  WNL Orientation: oriented to person, place, and situation Attention: Good Concentration: Good Memory: Impaired short term Fund of knowledge:  Good Insight:  Good Judgment:  Good Impulse Control: Good  Risk Assessment: Danger to  Self: No Self-injurious Behavior: No Danger to Others: No Duty to Warn:no Physical Aggression / Violence:No  Access to Firearms a concern: No   Gang Involvement:No  Treatment plan: Will employ cognitive behavioral therapy as well as grief and loss therapy for relief of anxiety and grief.  Goals of grief therapy or to have a healthier response to the loss of his daughter and less sadness as evidenced by patient report in therapy notes as well as to help him feel less overwhelmed by her loss.  Goals are to see a 50% reduction in grief symptoms over the next 6 months.  I will e ncourage the use of the patient telling his grief story about his daughter including encouraging him to bring pictures and memorabilia to help process his feelings including any feelings of guilt associated with her loss.  We will use cognitive behavioral therapy to help identify and change anxiety producing thoughts and behavior patterns so as to improve his ability to better manage anxiety and stress, and manage thoughts and worrisome  thinking contributing to anxiety.  We will  also refresh and encourage dialectical behavior therapy distress tolerance and mindfulness skills with the intention of reducing anxiety by 50% over the next 6 months. Progress: 30% Interventions: Cognitive Behavioral Therapy  Diagnosis:PTSD, Bi-Polar D/O  Plan: F/U/ appointments scheduled weekly.  Cecile Coder, [facility]                                                                                                                    Cecile Coder, [facility]               Cecile Coder, [facility]               Cecile Coder, [facility]               Cecile Coder, [facility]  [facility] Counselor/Therapist Progress Note  Patient ID: Dorrien Grunder, MRN: [id number],    Date: 04/01/2024 This session was held via video teletherapy. The patient consented to the video teletherapy a nd was located in his home during this session. He is aware it is the responsibility of the patient to secure confidentiality on his end of the session. The provider was in a private home office for the duration of this session.   The patient arrived on time for her Caregility session  Time Spent: 57 minutes, 2 PM to 2:57 PM  Treatment Type: Individual Therapy Reported Symptoms: anxiety, panic attacks The patient is still having  significant back pain.  He cannot sit anywhere for very long and has to be careful where he sits.  There is still some unsteadiness although he did not report any falls over the past 2  weeks.  His biggest concern now is for his wife.  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  She is meeting with her cardiologist again next week to see what possible treat ment is for her.  He still thinks there may be some issue with her diaphragm but she is meeting with the pulmonologist also.  He is doing what he can to help her.  His daughter's birthday is March 11 so he knows that is a difficult time for both he and his wife next week.  Typically they do something to remember her but because of the wife's current health issues they cannot go to [place] as they had planned so they are talking  about what to do to remember and celebrate her birthday.  We will continue to work on coping skills for helping with his anxiety and the progression of his Lewy body dementia diagnosis as well as processing his grief. Mental Status Exam: Appearance:  Well Groomed  Behavior: Appropriate Motor: Pt. Reports some instability due to Lewey Body dementia Speech/Language:  Normal Rate Affect: Appropriate Mood: normal Thought process : normal Thought content:  WNL Sensory/Perceptual disturbances:  WNL Orientation: oriented to person, place, and situation Attention: Good Concentration: Good Memory: Impaired short term Fund of knowledge:  Good Insight:  Good Judgment:  Good Impulse Control: Good  Risk Assessment: Danger to Self: No Self-injurious Behavior: No Danger to Others: No Duty to Warn:no Physical Aggression / Violence:No  Access to [employer] a concern: No  Gang Involvement:No  Treatment plan: Will employ cognitive behavioral therapy as well as grief and loss therapy for relief of anxiety and grief.  Goals of grief therapy or to have a healthier response to the loss of his daughter and less sadness as evidenced by patient report in therapy notes as well as to help him feel less overwhelmed by her loss.   Goals are to see a 50% reduction in grief symptoms over the next 6  months.  I will encourage the use of the patient telling his grief story about his daughter including encouraging him to bring pictures and memorabilia to help process his feelings including any feelings of guilt associated with her loss.  We will use cognitive behavioral therapy to help identify and change anxiety producing thoughts and behavior patterns so as to improve his ability to better manage anxiety and stress, and manage thoug hts and worrisome thinking contributing to anxiety.  We will also refresh and encourage dialectical behavior therapy distress tolerance and mindfulness  skills with the intention of reducing anxiety by 50% over the next 6 months. Progress: 30% Interventions: Cognitive Behavioral Therapy  Diagnosis:PTSD, Bi-Polar D/O  Plan: F/U/ appointments scheduled weekly.  Cecile Coder, [facility]                                                                                                                    Cecile Coder, [facility]  [facility] Counselor/Therapist Progress Note  Patient ID: Wallice Granville, MRN: [id number],    Date: 04/01/2024 This session was held via video teletherapy. The patient consented to the video teletherapy and was located in his home during this session. He is aware it is the responsibility of the patient to secure confidentiality  on his end of the session. The provider was in a private home office for the duration of this session.   The patient arrived on time for her Caregility session  Time Spent: 58 minutes, 2 PM to 2:58 PM  Treatment Type: Individual Therapy Reported Symptoms: anxiety, panic attacks A fairly difficult week because it was his daughter's birthday.  He said this year was more difficult than last year but he could not pinpoint why.  They  did remember her about going out to a nice restaurant which is something her daughter always wanted to do.  He said they were able to laugh and joke about funny things that his daughter had said or done which is not something they were able to do this time last year so he saw that as healthy grieving.  He knows that the time change threw things off a little bit for everybody and his wife still is not feeling well and his back has been  bothering him.  He was not able to go to his first round of physical therapy this week because his back was bothering him more.  He has been thinking a lot more about his daughter which  he feels comfortable with.  He is thankful that it is getting warmer and staying light longer so he can get back to some of the coping skills such as fishing that he has done in the past.   We will continue to work on coping skills for helping with hi s anxiety and the progression of his Lewy body dementia diagnosis as well as processing his grief. Mental Status Exam: Appearance:  Well Groomed  Behavior: Appropriate Motor: Pt. Reports some instability due to Lewey Body dementia Speech/Language:  Normal Rate Affect: Appropriate Mood: normal Thought process: normal Thought content:  WNL Sensory/Perceptual disturbances:  WNL Orientation: oriented to person, place, and sit uation Attention: Good Concentration: Good Memory: Impaired short term Fund of knowledge:  Good Insight:  Good Judgment:  Good Impulse Control: Good  Risk Assessment: Danger to Self: No Self-injurious Behavior: No Danger to Others: No Duty to Warn:no Physical Aggression / Violence:No  Access to Firearms a concern: No  Gang Involvement:No  Treatment plan: Will employ cognitive behavioral therapy as well as grief and lo ss therapy for relief of anxiety and grief.  Goals of grief therapy or to have a healthier response to the loss of his daughter and less sadness as evidenced by patient report in therapy notes as well as to help him feel less overwhelmed by her loss.  Goals are to see a 50% reduction in grief symptoms over the next 6 months.  I will encourage the use of the patient telling his grief story about his daughter including encouraging him to  bring pictures and memorabilia to help process his feelings including any feelings of guilt associated with her loss.  We will use cognitive behavioral therapy to help identify and change anxiety producing thoughts and behavior patterns so as to improve his ability to better manage anxiety and stress, and manage thoughts and worrisome thinking contributing to anxiety.   We will also refresh and encourage dialectical behavior therapy dist  ress tolerance and mindfulness skills with the intention of reducing anxiety by 50% over the next 6 months. Progress: 30% Interventions: Cognitive Behavioral Therapy  Diagnosis:PTSD, Bi-Polar D/O  Plan: F/U/ appointments scheduled weekly.  Cecile Coder, [facility]                                                                                                                    Cecile Coder, [facility]               Cecile Coder, [facility]               Cecile Coder, [facility]  [facility] Counselor/Therapist Progress  Note  Patient ID: Micco Bourbeau, MRN: [id number],    Date: 04/01/2024 This session was held via video teletherapy. The patient consented to the video teletherapy and was located in his home during this session. He is aware it is the responsibility of the patient to secure confidentiality on his end of the session. Th e provider was in a private home office for the duration of this session.   The patient arrived on time for her Caregility session  Time Spent: 57 minutes, 2 PM to 2:57 PM  Treatment Type: Individual Therapy Reported Symptoms: anxiety, panic attacks The patient is still having significant back pain.  He cannot sit anywhere for very long and has to be careful where he sits.  There is still some unsteadiness although he did not re port any falls over the past 2 weeks.  His biggest concern now is for his wife.  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  She is meeting with her cardiologist again next week to see what possible treatment is for her.  He still thinks there may be some issue with her diaphragm but she is meeting with the pulmonologist also.  He is doing what he can to he lp her.  His daughter's birthday is March 11 so he knows that is a difficult time for both he and his wife next week.  Typically they do something to remember her but because of the wife's current health issues they cannot go to [place] as they had planned so they are talking about what to do to remember and celebrate her birthday.  We will continue to work on coping skills for helping with his anxiety and the progression of h is Lewy body dementia diagnosis as well as processing his grief. Mental Status Exam: Appearance:  Well Groomed  Behavior: Appropriate Motor: Pt. Reports some instability due to Lewey Body dementia Speech/Language:  Normal Rate Affect: Appropriate Mood: normal Thought  process: normal Thought content:  WNL Sensory/Perceptual disturbances:  WNL Orientation: oriented to person, place, and situation Attention: Good Concentra tion: Good Memory: Impaired short term Fund of knowledge:  Good Insight:  Good Judgment:  Good Impulse Control: Good  Risk Assessment: Danger to Self: No Self-injurious Behavior: No Danger to Others: No Duty to Warn:no Physical Aggression / Violence:No  Access to Firearms a concern: No  Gang Involvement:No  Treatment plan: Will employ cognitive behavioral therapy as well as grief and loss therapy for relief of anxiety a nd grief.  Goals of grief therapy or to have a healthier response to the loss of his daughter and less sadness as evidenced by patient report in therapy notes as well as to help him feel less overwhelmed by her loss.  Goals are to see a 50% reduction in grief symptoms over the next 6 months.  I will encourage the use of the patient telling his grief story about his daughter including encouraging him to bring pictures and memorabilia to  help process his feelings including any feelings of guilt associated with her loss.  We will use cognitive behavioral therapy to help identify and change anxiety producing thoughts and behavior patterns so as to improve his ability to better manage anxiety and stress, and manage thoughts and worrisome thinking contributing to anxiety.  We will also refresh and encourage dialectical behavior therapy distress tolerance and mindfulness ski  lls with the intention of reducing anxiety by 50% over the next 6 months. Progress: 30% Interventions: Cognitive Behavioral Therapy  Diagnosis:PTSD, Bi-Polar D/O  Plan: F/U/ appointments scheduled weekly.  Cecile Coder, [facility]                                                                                                                     Cecile Coder, [facility]  [facility] Counselor/Therapist Progress Note  Pat ient ID: Gumaro Brightbill, MRN: [id number],    Date: 04/01/2024 This session was held via video teletherapy. The patient consented to the video teletherapy and was located in his home during this session. He is aware it is the responsibility of the patient to secure confidentiality on his end of the session. The provider was in a private home office for the duration of this session.   The patient arrived on time for her Caregility  session  Time Spent: 57 minutes, 2 PM to 2:57 PM  Treatment Type: Individual Therapy Reported Symptoms: anxiety, panic attacks  We will continue to work on coping skills for  helping with his anxiety and the progression of his Lewy body dementia diagnosis as well as processing his grief. Mental Status Exam: Appearance:  Well Groomed  Behavior: Appropriate Motor: Pt. Reports some instability due to Lewey Body dementia Speech/ Language:  Normal Rate Affect: Appropriate Mood: normal Thought process: normal Thought content:  WNL Sensory/Perceptual disturbances:  WNL Orientation: oriented to person, place, and situation Attention: Good Concentration: Good Memory: Impaired short term Fund of knowledge:  Good Insight:  Good Judgment:  Good Impulse Control: Good  Risk Assessment: Danger to Self: No Self-injurious Behavior: No Danger to Others:  No Duty to Warn:no Physical Aggression / Violence:No  Access to Firearms a concern: No  Gang Involvement:No  Treatment plan: Will employ cognitive behavioral therapy as well as grief and loss therapy for relief of anxiety and grief.  Goals of grief therapy or to have a healthier response to the loss of his daughter and less sadness as evidenced by patient report in therapy notes as well as to help him feel less overwhelmed by her  loss.  Goals are to see a 50% reduction in grief symptoms over the next 6 months.  I will encourage the use of the patient telling his grief story about his daughter including encouraging him to bring pictures and memorabilia to help process his feelings including any feelings of guilt associated with her loss.  We will use cognitive behavioral therapy to help identify and change anxiety producing thoughts and behavior patterns so as to  improve his ability to better manage anxiety and stress, and manage thoughts and worrisome thinking contributing to anxiety.  We will also refresh and encourage dialectical behavior therapy distress tolerance and mindfulness skills with the intention of reducing anxiety by 50% over the next 6 months. Progress: 30% Interventions: Cognitive Behavioral  Therapy  Diagnosis:PTSD, Bi-Polar D/O  Plan: F/U/ appointments scheduled weekly.   Cecile Coder, [facility]                                                                                                                   Cecile Coder, [facility]               Cecile Coder, [facility]               Cecile Coder, [facility]  Cecile Coder, [facility]                Cecile Coder, [facility]               Cecile Coder, [facility]               Cecile Coder, [facility]  [facility] Counselor/Therapist Progress Note  Patient ID:  Kamaree Wheatley, MRN: [id number],    Date: 04/01/2024 This session was held via video teletherapy. The patient consented to the video teletherapy and was located in his home during this session. He is aware it is the responsibility of the patient to secure confidentiality on his end of the session. The provider was in a private home office f or the duration of this session.   The patient arrived on time for her Caregility session  Time Spent: 39 minutes, 2 PM to 2:39 PM  Treatment Type: Individual Therapy Reported Symptoms: anxiety, panic attacks The patient was not feeling well today.  He started feeling really earlier in the week having a fever bad headaches body aches.  He felt a little better yesterday but feels a little bit of a resurgence in headaches and body  aches and is running a low-grade fever.  His wife is feeling some of the same symptoms.  For the most part he says he is doing well.  They did increase his memory medication because they were starting to see some increasing decline in short-term memory and he said until recently his long-term memory had been good but he was starting to see a difference in how he used to markers to remember what happened at certain times historically.   He feels the medication has helped and has brightened his mood a little also.  He is still trying to walk every day and fish when he can and has enjoyed watching the [employer] as a good distraction.  He reports that he feels he is managing mood fairly well.  He has an appointment in 2 weeks.  He does contract for safety having no thoughts of hurting himself or anyone else. We will continue to work on coping skills for h elping with his anxiety and the progression of his Lewy body dementia diagnosis as well as processing his grief. Mental Status Exam: Appearance:  Well Groomed  Behavior: Appropriate Motor: Pt. Reports some instability due to Lewey Body dementia Speech/Language:  Normal  Rate Affect: Appropriate Mood: normal Thought process: normal Thought content:  WNL Sensory/Perceptual disturbances:  WNL Orientation: oriented to person,  place, and situation Attention: Good Concentration: Good Memory: Impaired short term Fund of knowledge:  Good Insight:  Good Judgment:  Good Impulse Control: Good  Risk Assessment: Danger to Self: No Self-injurious Behavior: No Danger to Others: No Duty to Warn:no Physical Aggression / Violence:No  Access to Firearms a concern: No  Gang Involvement:No  Treatment plan: Will employ cognitive behavioral therapy as well a s grief and loss therapy for relief of anxiety and grief.  Goals of grief therapy or to have a healthier response to the loss of his daughter and less sadness as evidenced by patient report in therapy notes as well as to help him feel less overwhelmed by her loss.  Goals are to see a 50% reduction in grief symptoms over the next 6 months.  I will encourage the use of the patient telling his grief story about his daughter including encou raging him to bring pictures and memorabilia to help process his feelings including any feelings of guilt associated with her loss.  We will use cognitive behavioral therapy to help identify and change anxiety producing thoughts and behavior patterns so as to improve his ability to better manage anxiety and stress, and manage thoughts and worrisome thinking contributing to anxiety.  We will also refresh and  encourage dialectical behavio r therapy distress tolerance and mindfulness skills with the intention of reducing anxiety by 50% over the next 6 months. Progress: 30% Interventions: Cognitive Behavioral Therapy  Diagnosis:PTSD, Bi-Polar D/O  Plan: F/U/ appointments scheduled weekly.  Cecile Coder, [facility]                                                                                                                     Cecile Coder, [facility]  [place] [facility] Counselor/Therapist Progress Note  Patient ID: Merric Yost, MRN: [id number],    Date: 04/01/2024 This session was held via video teletherapy. The patient consented to the video teletherapy and was located in his home during this session. He is aware it is the responsibility of the patient to secure confidentiality on his end of the session. The provider was in a private home office for the duration of this session.    The patient arrived on time for her Caregility session  Time Spent: 58 minutes, 2 PM to 2:58 PM  Treatment Type: Individual Therapy Reported Symptoms: anxiety, panic attacks A  fairly difficult week because it was his daughter's birthday.  He said this year was more difficult than last year but he could not pinpoint why.  They did remember her about going out to a nice restaurant which is something her daughter always wanted to do.   He said they were able to laugh and joke about funny things that his daughter had said or done which is not something they were able to do this time last year so he saw that as healthy grieving.  He knows that the time change threw things off a little bit for everybody and his wife still is not feeling well and his back has been bothering him.  He was not able to go to his first round of physical therapy this week because his back was  bothering him more.  He has been thinking a lot more about his daughter which he feels comfortable with.  He is thankful that it is getting warmer and staying light longer so he can get back to some of the coping skills such as fishing that he has done in the past.   We will continue to work on coping skills for helping with his anxiety and the progression of his Lewy body dementia diagnosis as well as processing his grief. Mental  Status Exam: Appearance:  Well Groomed  Behavior: Appropriate Motor: Pt. Reports some instability due to Lewey Body dementia Speech/Language:  Normal Rate Affect: Appropriate Mood: normal Thought process: normal Thought content:  WNL Sensory/Perceptual disturbances:  WNL Orientation: oriented to person, place, and situation Attention: Good Concentration: Good Memory: Impaired short term Fund of knowledge:  Good Insigh Stephen Myers:  Good Judgment:  Good Impulse Control: Good  Risk Assessment: Danger to Self: No Self-injurious Behavior: No Danger to Others: No Duty to Warn:no Physical Aggression / Violence:No  Access to Firearms a concern: No  Gang Involvement:No  Treatment plan: Will employ cognitive behavioral therapy as well as grief and loss therapy for relief of anxiety  and grief.  Goals of grief therapy or to have a healthier response to the  loss of his daughter and less sadness as evidenced by patient report in therapy notes as well as to help him feel less overwhelmed by her loss.  Goals are to see a 50% reduction in grief symptoms over the next 6 months.  I will encourage the use of the patient telling his grief story about his daughter including encouraging him to bring pictures and memorabilia to help process his feelings including any feelings of guilt associated with  her loss.  We will use cognitive behavioral therapy to help identify and change anxiety producing thoughts and behavior patterns so as to improve his ability to better manage anxiety and stress, and manage thoughts and worrisome thinking contributing to anxiety.  We will also refresh and encourage dialectical behavior therapy  distress tolerance and mindfulness skills with the intention of reducing anxiety by 50% over the next 6 months.  Progress: 30% Interventions: Cognitive Behavioral Therapy  Diagnosis:PTSD, Bi-Polar D/O  Plan: F/U/ appointments scheduled weekly.  Cecile Coder, [facility]                                                                                                                   Cecile Coder, [facility]                Cecile Coder, [facility]               Cecile Coder, [facility]  [facility] Counselor/Therapist Progress Note   Patient ID: Fredrick Geoghegan, MRN: [id number],    Date: 04/01/2024 This session was held via video teletherapy. The patient consented to the video teletherapy and was located in his home during this session. He is aware it is the responsibility of the patient to secure confidentiality on his end of the session. The provider was in a private home office for the duration of this session.   The patient arrived on time for her Care gility session  Time Spent: 1:05 PM until 2 PM, 55 minutes Treatment Type: Individual Therapy Reported Symptoms: anxiety, panic attacks The patient said he came in from his walk this morning and saw what he thought was some sort of tic from his wife.  It was not the first time it happened and he wonders if it might be tardive dyskinesia because of some new medications that she is on.  She has a call into the doctor right now and ho pes to hear something back soon.  They have narrowed down to the fact they think most of her discomfort in breathing is coming from the diaphragm and they are looking for alternatives  for treatment.  The patient continues to do those things which help him cope including walking, fishing when he can, spending time with his son and daughter-in-law and grandson.  He also has a way of continued coping with grief he is writing letters to h is daughters as a way of expressing his feelings and says that is very beneficial.  He appears to be grieving in a healthy way but he knows that he to year anniversary of her death is coming up and there is some grief there but says it is not overwhelming.  We will continue to work on coping skills for helping with his anxiety and the progression of his Lewy body dementia diagnosis as well as processing his grief. Mental Status Exam:  Appearance:  Well Groomed  Behavior: Appropriate Motor: Pt. Reports some instability due to Lewey Body dementia Speech/Language:  Normal Rate Affect: Appropriate Mood: normal Thought process: normal Thought content:  WNL Sensory/Perceptual disturbances:  WNL Orientation: oriented to person, place, and situation Attention: Good Concentration: Good Memory: Impaired short term Fund of knowledge:  Good Insight:  Good J udgment:  Good Impulse Control: Good  Risk Assessment: Danger to Self: No Self-injurious Behavior: No Danger to Others: No Duty to Warn:no Physical Aggression / Violence:No  Access to Firearms a concern: No  Gang Involvement:No  Treatment plan: Will employ cognitive behavioral therapy as well as grief and loss therapy for relief of anxiety and grief.  Goals of grief therapy or to have a healthier response to the loss of his  daughter and less sadness as evidenced by patient report in therapy notes as well as to help him feel less overwhelmed by her loss.  Goals are to see a 50% reduction in grief symptoms over the next 6 months.  I will encourage the use of the patient telling his grief story about his daughter including encouraging him to bring pictures and memorabilia to help process  his feelings including any feelings of guilt associated with her loss.   We will use cognitive behavioral therapy to help identify and change anxiety producing thoughts and behavior patterns so as to improve his ability to better manage anxiety and stress, and manage thoughts and worrisome thinking contributing to anxiety.  We will also refresh and encourage dialectical behavior therapy distress tolerance  and mindfulness skills with the intention of reducing anxiety by 50% over the next 6 months. Progress:  30% Interventions: Cognitive Behavioral Therapy  Diagnosis:PTSD, Bi-Polar D/O  Plan: F/U/ appointments scheduled weekly.  Cecile Coder, [facility]                                                                                                                   Cecile Coder, [facility]  [facility] Counselor/Therapist Progress Note  Patient ID: Cleven Jansma, MRN: [id number],    Date: 04/01/2024 This session was he ld via video teletherapy. The patient consented to the video teletherapy and was located in his home during this session. He is aware it is the responsibility of the patient to secure confidentiality on his end of the session. The provider was in a private home office for the duration of this session.   The patient arrived on time for her Caregility session  Time Spent: 59 minutes, 2 PM to 2:59 PM  Treatment Type: Individual Thera py Reported Symptoms: anxiety, panic attacks The patient continues to present with physical pain and both his back and his knee.  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  He has continued to walk because he knows that is good for him in variou s ways but says it has been painful.  His wife also was not feeling well but she is going back to the doctor next week to have an ablation and hopes that will bring her some relief.  For the most part he has been at the last couple weeks reaching back out and hearing from some old friends as well as time with his son and son's family.  He says that his wife's cousin is coming down for a weekend soon and they enjoy time with her and that  he will go to his brother's house sometime in the next few weeks.  He recognizes the need for people that he cares about in helping with his depression. We will continue to work on  coping skills for helping with his anxiety and the progression of his Lewy body dementia diagnosis as well as processing his grief. Mental Status Exam: Appearance:  Well Groomed  Behavior: Appropriate Motor: Pt. Reports some instability due to Lewey B ody dementia Speech/Language:  Normal Rate Affect: Appropriate Mood: normal Thought process: normal Thought content:  WNL Sensory/Perceptual disturbances:  WNL Orientation: oriented to person, place, and situation Attention: Good Concentration: Good Memory: Impaired short term Fund of knowledge:  Good Insight:  Good Judgment:  Good Impulse Control: Good  Risk Assessment: Danger to Self: No Self-injurious Behavior:  No Danger to Others: No Duty to Warn:no Physical Aggression / Violence:No  Access to Firearms a concern: No  Gang Involvement:No  Treatment plan: Will employ cognitive behavioral therapy as well as grief and loss therapy for relief of anxiety and grief.  Goals of grief therapy or to have a healthier response to the loss of his daughter and less sadness as evidenced by patient report in therapy notes as well as to help him feel les s overwhelmed by her loss.  Goals are to see a 50% reduction in grief symptoms over the next 6 months.  I will encourage the use of the patient telling his grief story about his daughter including encouraging him to bring pictures and memorabilia to help process his feelings including any feelings of guilt associated with her loss.  We will use cognitive behavioral therapy to help identify and change anxiety producing thoughts and behav ior patterns so as to improve his ability to better manage anxiety and stress, and manage thoughts and worrisome thinking contributing to anxiety.  We will also  refresh and encourage dialectical behavior therapy distress tolerance and mindfulness skills with the intention of reducing anxiety by 50% over the next 6 months. Progress: 30% Interventions: Cognitive  Behavioral Therapy  Diagnosis:PTSD, Bi-Polar D/O  Plan: F/U/ appointmen ts scheduled weekly.  Cecile Coder, [facility]                                                                                                                   Cecile Coder, [facility]               Cecile Coder, [facility]               Cecile Coder, [facility]               [address], [facility]  [place] [facility] Counselor/Therapist Progress Note  Patient ID: Blayke Cordrey, MRN: [id number],     Date: 04/01/2024 This session was held via video teletherapy. The patient consented to the video  teletherapy and was located in his home during this session. He is aware it is the responsibility of the patient to secure confidentiality on his end of the session. The provider was in a private home office for the duration of this session.   The patient arrived on time for her Caregility session  Time Spent: 57 minutes, 2 PM to 2:57  PM  Treatment Type: Individual Therapy Reported Symptoms: anxiety, panic attacks The patient is still having significant back pain.  He cannot sit anywhere for very long and has to be careful where he sits.  There is still some unsteadiness although he did not report any falls over the past 2 weeks.  His biggest concern now is for his wife.  She has been having some difficulty breathing and they did a test so they found a small hole  on the back side of her heart.  She is meeting with her cardiologist again next week to see what possible treatment is for her.  He still thinks there may be some issue with her diaphragm but she is meeting with the pulmonologist also.  He is doing what he can to help her.  His daughter's birthday is March 11 so he knows that is a difficult time for both he and his wife next week.  Typically they do something to remember her but beca use of the wife's current health issues they cannot go to [place] as they had planned so they are talking about what to do to remember and celebrate her birthday.  We will continue to work on coping skills for helping with his anxiety and the progression of his Lewy body dementia diagnosis as well as processing his grief. Mental Status Exam: Appearance:  Well Groomed  Behavior: Appropriate Motor: Pt. Reports some instabili ty due to Lewey Body dementia Speech/Language:  Normal Rate Affect: Appropriate Mood: normal Thought process: normal Thought content:  WNL Sensory/Perceptual disturbances:  WNL Orientation: oriented to person, place, and  situation Attention: Good Concentration: Good Memory: Impaired short term Fund of knowledge:  Good Insight:  Good Judgment:  Good Impulse Control: Good  Risk Assessment: Danger to Self: No Self-inj urious Behavior: No Danger to Others: No Duty to Warn:no Physical Aggression / Violence:No  Access to Firearms a concern: No  Gang Involvement:No  Treatment plan: Will employ cognitive behavioral therapy as well as grief and loss therapy for relief of anxiety and grief.  Goals of grief therapy or to have a healthier response to the loss of his daughter and less sadness as evidenced by patient report in therapy notes as well as to  help him feel less overwhelmed by her loss.  Goals are to see a 50% reduction in grief symptoms over the next 6 months.  I will encourage the use of the patient telling his grief story about his daughter including encouraging him to bring pictures and memorabilia to help process his feelings including any feelings of guilt associated with her loss.  We will use cognitive behavioral therapy to help identify and change anxiety producing Stephen Myers houghts and behavior patterns so as to improve his ability to better manage anxiety and stress, and manage thoughts and worrisome thinking contributing to anxiety.  We will also refresh and encourage dialectical behavior therapy distress tolerance and mindfulness  skills with the intention of reducing anxiety by 50% over the next 6 months. Progress: 30% Interventions: Cognitive Behavioral Therapy  Diagnosis:PTSD, Bi-Polar D/O  Plan : F/U/ appointments scheduled weekly.  Cecile Coder, [facility]                                                                                                                   Cecile Coder,  [facility]  [place] [facility] Counselor/Therapist Progress Note  Patient ID: Jaiceon Collister, MRN: [id number],    Date: 04/01/2024 This session was held via video teletherapy. The patient consented to the video teletherapy and was located  in his home during this session. He is aware it is the responsibility of the patient to secure confidentiality on his end of the session. The provider was in a private home office for the duration of this session.   The patient arrived on time for her Caregility session  Time Spent: 58 minutes, 2 PM to 2:58 PM  Treatment Type: Individual Therapy Reported Symptoms: anxiety, panic attacks A fairly difficult week because it was hi s daughter's birthday.  He said this year was more  difficult than last year but he could not pinpoint why.  They did remember her about going out to a nice restaurant which is something her daughter always wanted to do.  He said they were able to laugh and joke about funny things that his daughter had said or done which is not something they were able to do this time last year so he saw that as healthy grieving.  He knows that the time  change threw things off a little bit for everybody and his wife still is not feeling well and his back has been bothering him.  He was not able to go to his first round of physical therapy this week because his back was bothering him more.  He has been thinking a lot more about his daughter which he feels comfortable with.  He is thankful that it is getting warmer and staying light longer so he can get back to some of the coping skills  such as fishing that he has done in the past.   We will continue to work on coping skills for helping with his anxiety and the progression of his Lewy body dementia diagnosis as well as processing his grief. Mental Status Exam: Appearance:  Well Groomed  Behavior: Appropriate Motor: Pt. Reports some instability due to Lewey Body dementia Speech/Language:  Normal Rate Affect: Appropriate Mood: normal Thought process: normal  Thought content:  WNL Sensory/Perceptual disturbances:  WNL Orientation: oriented to person, place, and situation Attention: Good Concentration: Good Memory: Impaired short term Fund of knowledge:  Good Insight:  Good Judgment:  Good Impulse Control: Good  Risk Assessment: Danger to Self: No Self-injurious Behavior: No Danger to Others: No Duty to Warn:no Physical Aggression / Violence:No  Access to Firearms a conc ern: No  Gang Involvement:No  Treatment plan: Will employ cognitive behavioral therapy as well as grief and loss therapy for relief of anxiety and grief.  Goals of grief therapy or to have a healthier response to the loss of his  daughter and less sadness as evidenced by patient report in therapy notes as well as to help him feel less overwhelmed by her loss.  Goals are to see a 50% reduction in grief symptoms over the next 6 months.   I will encourage the use of the patient telling his grief story about his daughter including encouraging him to bring pictures and memorabilia to help process his feelings including any feelings of guilt associated with her loss.  We will use cognitive behavioral therapy to help identify and change anxiety producing thoughts and behavior patterns so as to improve his ability to better manage anxiety and stress, and manage thoughts and w orrisome thinking contributing to anxiety.  We will also refresh and encourage dialectical behavior therapy  distress tolerance and mindfulness skills with the intention of reducing anxiety by 50% over the next 6 months. Progress: 30% Interventions: Cognitive Behavioral Therapy  Diagnosis:PTSD, Bi-Polar D/O  Plan: F/U/ appointments scheduled weekly.  Cecile Coder, [facility]                                                                                                                    Cecile Coder, [facility]               Cecile Coder, [facility]               Cecile Coder, [facility]  [place] [facility] Counselor/Therapist Progress Note  Patient ID: Tremell Reimers, MRN: [id number],    Date: 04/01/2024 This session was held via video teletherapy. The patient consented to the video teletherapy and was located in his home during this sess ion. He is aware it is the responsibility of the patient to secure confidentiality on his end of the session. The provider was in a private home office for the duration of this session.   The patient arrived on time for her Caregility session  Time Spent: 57 minutes, 2 PM to 2:57 PM  Treatment Type: Individual Therapy Reported Symptoms: anxiety, panic attacks The patient is still having significant back pain.  He cannot sit anyw here for very long and has to be careful where he sits.  There is still some unsteadiness although he did not report any falls over the past 2 weeks.  His biggest concern now is for his  wife.  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  She is meeting with her cardiologist again next week to see what possible treatment is for her.  He still thinks there may  be some issue with her diaphragm but she is meeting with the pulmonologist also.  He is doing what he can to help her.  His daughter's birthday is March 11 so he knows that is a difficult time for both he and his wife next week.  Typically they do something to remember her but because of the wife's current health issues they cannot go to [place] as they had planned so they are talking about what to do to remember and celebrate  her birthday.  We will continue to work on coping skills for helping with his anxiety and the progression of his Lewy body dementia diagnosis as well as processing his grief. Mental Status Exam: Appearance:  Well Groomed  Behavior: Appropriate Motor: Pt. Reports some instability due to Lewey Body dementia Speech/Language:  Normal Rate Affect: Appropriate Mood: normal Thought process: normal Thought content:  WNL Sensory/P erceptual disturbances:  WNL Orientation: oriented to person, place, and situation Attention: Good Concentration: Good Memory: Impaired short term Fund of knowledge:  Good Insight:  Good Judgment:  Good Impulse Control: Good  Risk Assessment: Danger to Self: No Self-injurious Behavior: No Danger to Others: No Duty to Warn:no Physical Aggression / Violence:No  Access to Firearms a concern: No  Gang Involvement:No  Tr eatment plan: Will employ cognitive behavioral therapy as well as grief and loss therapy for relief of anxiety and grief.  Goals of grief therapy or to have a healthier response to the loss of his daughter and less sadness as evidenced by patient report in therapy notes as well as to help him feel less overwhelmed by her loss.  Goals are to see a 50% reduction in grief  symptoms over the next 6 months.  I will encourage the use of the pa tient telling his grief story about his daughter including encouraging him to bring pictures and memorabilia to help process his feelings including any feelings of guilt associated with her loss.  We will use cognitive behavioral therapy to help identify and change anxiety producing thoughts and behavior patterns so as to improve his ability to better manage anxiety and stress, and manage thoughts and worrisome thinking contributing to  anxiety.  We will also refresh and encourage dialectical behavior therapy distress tolerance and mindfulness  skills with the intention of reducing anxiety by 50% over the next 6 months. Progress: 30% Interventions: Cognitive Behavioral Therapy  Diagnosis:PTSD, Bi-Polar D/O  Plan: F/U/ appointments scheduled weekly.  Cecile Coder, [facility]                                                                                                                    Cecile Coder, [facility]  [place] [facility] Counselor/Therapist Progress Note  Patient ID: Devon Pretty, MRN: [id number],    Date: 04/01/2024 This session was held via video teletherapy. The patient consented to the video teletherapy and was located in his home during this session. He is aware it is the responsibility of the patient to secure confidentiality on his end of the session. The provider was  in a private home office for the duration of this session.   The patient arrived on time for her Caregility session  Time Spent: 57 minutes, 2 PM to 2:57 PM  Treatment Type: Individual Therapy Reported Symptoms: anxiety, panic attacks That was a situation earlier in the week when the patient's neighbor was banging on his back door and window.  There had been situations where the same neighbor had said things and use profanity d irected at his wife feeling that the patient and his wife and let their dog use the bathroom in his yard.  The patient said they are very careful because her dog is old and did not think that happen but did not appreciate how they were handled things.  He said he got angry very quickly and did not handle it the way that he wanted to.  The situation has been resolved but the patient has had very clear boundaries with his neighbor. His w ife continues to progress well from her surgery.  Physically the patient is doing fairly well.  He had to see one of his brothers this week.  He is spending time with his son and son's  family.  He has gotten some retirement/insurance things works out for the most part and is glad to have that settled. We will continue to work on coping skills for helping with his anxiety and the progression of his Lewy body dementia diagnosis as well a s processing his grief. Mental Status Exam: Appearance:  Well Groomed  Behavior: Appropriate Motor: Pt. Reports some instability due to Lewey Body dementia Speech/Language:  Normal Rate Affect: Appropriate Mood: normal Thought process: normal Thought content:  WNL Sensory/Perceptual disturbances:  WNL Orientation: oriented to person, place, and situation Attention: Good Concentration: Good Memory: Impaired short term  Fund of knowledge:  Good Insight:  Good Judgment:  Good Impulse Control: Good  Risk Assessment: Danger to Self: No Self-injurious Behavior: No Danger to Others: No Duty to Warn:no Physical Aggression / Violence:No  Access to Firearms a concern: No  Gang Involvement:No  Treatment plan: Will employ cognitive behavioral therapy as well as grief and loss therapy for relief of anxiety and grief.  Goals of grief therapy or to h ave a healthier response to the loss of his daughter and less sadness as evidenced by patient report in therapy notes as well as to help him feel less overwhelmed by her loss.  Goals are to see a 50% reduction in grief symptoms over the next 6 months.  I will encourage the use of the patient telling his grief story about his daughter including encouraging him to bring pictures and memorabilia to help process his feelings including any f eelings of guilt associated with her loss.  We will use cognitive behavioral therapy to help identify and change anxiety producing thoughts and behavior patterns so as to improve his ability to better manage anxiety and stress, and manage thoughts and worrisome thinking contributing to anxiety.  We will also refresh and encourage dialectical behavior therapy distress  tolerance and mindfulness skills with the intention of reducing anxiet y by 50% over  the next 6 months. Progress: 30% Interventions: Cognitive Behavioral Therapy  Diagnosis:PTSD, Bi-Polar D/O  Plan: F/U/ appointments scheduled weekly.  Cecile Coder, [facility]                                                                                                                   Cecile Coder,  [facility]               Cecile Coder, [facility]               Cecile Coder, [facility]               Cecile Coder, [facility]               Cecile Coder, [facility]               Cecile Coder, [facility]               Cecile Coder, [facility]               Cecile Coder, [facility]               Cecile Coder, [facility]                Cecile Coder, [facility]  [facility] Counselor/Therapist Progress Note  Patient ID: Kebin Maye, MRN: [id number],    Date: 04/01/2024 This session was held via video teletherapy. The patient consented to the video teletherapy and was located in his home during this session. He is aware it is the responsibility of the patient to secure confidentiality on his end of the session. The provider was in a private home office for the duration of this session.   The patient arrived on time for her Caregility session  Time Spen Stephen Myers: 59 minutes, 2 PM to 2:59 PM  Treatment Type: Individual Therapy Reported Symptoms: anxiety, panic attacks The patient said he jokingly said to his wife that he had not had any pain issues in the cervical area and then a few days ago he started having pain in his neck and shoulders.  He now sits related to degenerative disk and has some consistent pain somewhere in his spine with that but jokingly says it just depends on Wednesday  which part of his body is decides to show up and hurt.  He is doing the best that he can.  He is still walking which he knows is good for him but does not have right now the strength to do as much in the morning as he has done in the past so he is denying that to 3 times a day for consistency.  He is still doing some positive things and that he is staying in touch with his brother and other family members.  He is reconnecting with frien ds from high school and he is which she is enjoying.  One of them has had a similar experience in that she lost her son in the same way the patient lost his daughter.  He said they did  not think and Easter in which they set a place for his daughter at the table and put her picture there.  He initially had reservations about doing that he and his wife had made everyone feel as if she was really there with him.  He still feels that he is  grieving in a healthy way.  He and his wife have always taken a trip around both of their birthdays which are 2 days apart.  That is now close to his daughter died so they have started taking some of her rashes with him and sprinkling them where they go as a way of remembrance.  They are deciding what that looks like this year in July.  He does contract for safety having no thoughts of hurting himself or anyone else. We will continue  to work on coping skills for helping with his anxiety and the progression of his Lewy body dementia diagnosis as well as processing his grief. Mental Status Exam: Appearance:  Well Groomed  Behavior: Appropriate Motor: Pt. Reports some instability due to Lewey Body dementia Speech/Language:  Normal Rate Affect: Appropriate Mood: normal Thought process: normal Thought content:  WNL Sensory/Perceptual disturbances:  WNL Or ientation: oriented to person, place, and situation Attention: Good Concentration: Good Memory: Impaired short term Fund of knowledge:  Good Insight:  Good Judgment:  Good Impulse Control: Good  Risk Assessment: Danger to Self: No Self-injurious Behavior: No Danger to Others: No Duty to Warn:no Physical Aggression / Violence:No  Access to Firearms a concern: No  Gang Involvement:No  Treatment plan: Will employ cogniti ve behavioral therapy as well as grief and loss therapy for relief of anxiety and grief.  Goals of grief therapy  or to have a healthier response to the loss of his daughter and less sadness as evidenced by patient report in therapy notes as well as to help him feel less overwhelmed by her loss.  Goals are to see a 50% reduction in grief symptoms over the next 6  months.  I will encourage the use of the patient telling his grief story abo ut his daughter including encouraging him to bring pictures and memorabilia to help process his feelings including any feelings of guilt associated with her loss.  We will use cognitive behavioral therapy to help identify and change anxiety producing thoughts and behavior patterns so as to improve his ability to better manage anxiety and stress, and manage thoughts and worrisome thinking contributing to anxiety.  We will also refresh an d encourage dialectical behavior therapy distress tolerance and mindfulness skills with the intention of reducing anxiety by 50% over the next 6 months. Progress: 30% Interventions: Cognitive Behavioral Therapy  Diagnosis:PTSD, Bi-Polar D/O  Plan: F/U/ appointments scheduled weekly.  Cecile Coder, [facility]                                                                                                                    Cecile Coder, [facility]  [facility] Counselor/Therapist Progress Note  Patient ID: Alice Vitelli, MRN: [id number],    Date: 04/01/2024 This session was held via video teletherapy. The patient consented to the video teletherapy and was located in his home during this session. He is aware it is the responsibility of the patient to secure confidentiality on his end of the session. The provider was in a private home office for the  duration of this session.   The patient arrived on time for her Caregility session  Time Spent: 58 minutes, 2 PM to 2:58 PM  Treatment Type: Individual Therapy Reported Symptoms: anxiety, panic attacks A fairly difficult week because it was his daughter's birthday.  He said this year was more difficult than last year but he could not pinpoint why.  They did remember her about going out to a nice restaurant which is something he r daughter always wanted to do.  He said they were able to laugh and joke about funny things that his daughter had said or done which is not something they were able to do this time last year so he saw that as healthy grieving.  He knows that the time change threw things off a little bit for everybody and his wife still is not feeling well and his back has been bothering him.  He was not able to go to his first round of physical therapy  this week because his back was bothering him more.  He has been thinking a lot more about his daughter which he feels comfortable with.  He is thankful that it is getting warmer and  staying light longer so he can get back to some of the coping skills such as fishing that he has done in the past.   We will continue to work on coping skills for helping with his anxiety and the progression of his Lewy body dementia diagnosis as well as  processing his grief. Mental Status Exam: Appearance:  Well Groomed  Behavior: Appropriate Motor: Pt. Reports some instability due to Lewey Body dementia Speech/Language:  Normal Rate Affect: Appropriate Mood: normal Thought process: normal Thought content:  WNL Sensory/Perceptual disturbances:  WNL Orientation: oriented to person, place, and situation Attention: Good Concentration: Good Memory: Impaired short term F und of knowledge:  Good Insight:  Good Judgment:  Good Impulse Control: Good  Risk Assessment: Danger to Self: No Self-injurious Behavior: No Danger to Others: No Duty to Warn:no Physical Aggression / Violence:No  Access to Firearms a concern: No  Gang Involvement:No  Treatment plan: Will employ cognitive behavioral therapy as well as grief and loss therapy for relief of anxiety and grief.  Goals of grief therapy or to ha ve a healthier response to the loss of his daughter and less sadness as evidenced by patient report in therapy notes as well as to help him feel less overwhelmed by her loss.  Goals are to see a 50% reduction in grief symptoms over the next 6 months.  I will encourage the use of the patient telling his grief story about his daughter including encouraging him to bring pictures and memorabilia to help process his feelings including any fe elings of guilt associated with her loss.  We will use cognitive behavioral therapy to help identify and change anxiety producing thoughts and behavior patterns so as to improve his ability to better manage anxiety and stress, and manage thoughts and worrisome thinking contributing to anxiety.  We will also refresh and encourage dialectical behavior therapy distress  tolerance and mindfulness skills with the intention of reducing anxiety  by 50% over the next 6 months. Progress: 30% Interventions: Cognitive Behavioral Therapy  Diagnosis:PTSD, Bi-Polar D/O  Plan: F/U/ appointments scheduled weekly.  Cecile Coder, [facility]                                                                                                                   Cecile Coder, [facility]               Cecile Coder, [facility]               Cecile Coder, [facility]  [facility] unselor/Therapist Progress Note  Patient ID: Kameryn Tisdel, MRN: [id number],    Date: 04/01/2024 This  session was held via video teletherapy. The patient consented to the video teletherapy and was located in his home during this session. He is aware it is the responsibility of the patient to secure confidentiality on his end of the session. The provider was in a private home office for the duration of this session.   The patie nt arrived on time for her Caregility session  Time Spent: 1:05 PM until 2 PM, 55 minutes Treatment Type: Individual Therapy Reported Symptoms: anxiety, panic attacks The patient said he came in from his walk this morning and saw what he thought was some sort of tic from his wife.  It was not the first time it happened and he wonders if it might be tardive dyskinesia because of some new medications that she is on.  She has a call i nto the doctor right now and hopes to hear something back soon.  They have narrowed down to the fact they think most of her discomfort in breathing is coming from the diaphragm and they are looking for alternatives for treatment.  The patient continues to do those things which help him cope including walking, fishing when he can, spending time with his son and daughter-in-law and grandson.  He also has a way of continued coping with g rief he is writing letters to his daughters as a way of expressing his feelings and says that is very beneficial.  He appears to be grieving in a healthy way but he knows that he to year anniversary of her death is coming up and there is some grief there but says it is not overwhelming.  We will continue to work on coping skills for helping with his anxiety and the progression of his Lewy body dementia diagnosis as well as processing  his grief. Mental Status Exam: Appearance:  Well Groomed  Behavior: Appropriate Motor: Pt. Reports some instability due to Lewey Body dementia Speech/Language:  Normal Rate Affect: Appropriate Mood: normal Thought process: normal Thought content:  WNL Sensory/Perceptual  disturbances:  WNL Orientation: oriented to person, place, and situation Attention: Good Concentration: Good Memory: Impaired short term Fund of knowl edge:  Good Insight:  Good Judgment:  Good Impulse Control: Good  Risk Assessment: Danger to Self: No Self-injurious Behavior: No Danger to Others: No Duty to Warn:no Physical Aggression / Violence:No  Access to Firearms a concern: No  Gang Involvement:No  Treatment plan: Will employ cognitive behavioral therapy as well as grief and loss therapy for relief of anxiety and grief.  Goals of grief therapy or to have a healthi er response to the loss of his daughter and less sadness as evidenced by patient report in therapy notes as well as to help him feel less overwhelmed by her loss.  Goals are to see a 50% reduction in grief symptoms over the next 6 months.  I will encourage the use of the patient telling his grief story about his daughter including encouraging him to bring pictures and memorabilia to help process his feelings including any feelings of gu ilt associated with her loss.  We will use cognitive behavioral therapy to help identify and change anxiety producing thoughts and behavior patterns so as to improve his ability to better manage anxiety and stress, and manage thoughts and worrisome thinking contributing to anxiety.  We will also refresh and encourage dialectical behavior therapy distress tolerance  and mindfulness skills with the intention of reducing anxiety by 50% over  the next 6 months. Progress: 30% Interventions: Cognitive Behavioral Therapy  Diagnosis:PTSD, Bi-Polar D/O  Plan: F/U/ appointments scheduled weekly.  Cecile Coder, [facility]                                                                                                                   Cecile Coder, [facility]  [facility] Counselor/Therapist Progress Note  Patient ID: Legend Tumminello, MRN: [id number],    Date:  04/01/2024 This session was held via video teletherapy. The patient consented to the video teletherapy and was located in his home during this session. He is aware it is the responsibility of the patient to secure confidentiality on his end of the session. The provider was in a private home office for the duration of this session.   The patient arrived on time for her Caregility session  Time Spent: 59 minutes, 2 PM to 2:59 PM  Stephen Myers reatment Type: Individual Therapy Reported Symptoms: anxiety, panic attacks The patient continues to present with physical pain and both his back and his knee.  He started with a new  physical therapist and told him that there was a lot of arthritis in both knees and or certain things he cannot do but they had him do that anyway and now for the past 3 days he has been in significant knee pain.  He has continued to walk because he knows  that is good for him in various ways but says it has been painful.  His wife also was not feeling well but she is going back to the doctor next week to have an ablation and hopes that will bring her some relief.  For the most part he has been at the last couple weeks reaching back out and hearing from some old friends as well as time with his son and son's family.  He says that his wife's cousin is coming down for a weekend soon and th ey enjoy time with her and that he will go to his brother's house sometime in the next few weeks.  He recognizes the need for people that he cares about in helping with his depression. We will continue to work on coping skills for helping with his anxiety and the progression of his Lewy body dementia diagnosis as well as processing his grief. Mental Status Exam: Appearance:  Well Groomed  Behavior: Appropriate Motor: Pt. Reports  some instability due to Lewey Body dementia Speech/Language:  Normal Rate Affect: Appropriate Mood: normal Thought process: normal Thought content:  WNL Sensory/Perceptual disturbances:  WNL Orientation: oriented to person, place, and situation Attention: Good Concentration: Good Memory: Impaired short term Fund of knowledge:  Good Insight:  Good Judgment:  Good Impulse Control: Good  Risk Assessment: Danger to Self:  No Self-injurious Behavior: No Danger to Others: No Duty to Warn:no Physical Aggression / Violence:No  Access to Firearms a concern: No  Gang Involvement:No  Treatment plan: Will employ cognitive behavioral therapy as well as grief and loss therapy for relief of anxiety and grief.  Goals of grief therapy or to have a healthier response to the loss of his  daughter and less sadness as evidenced by patient report in therapy notes  as well as to help him feel less overwhelmed by her loss.  Goals are to see a 50% reduction in grief symptoms over the next 6 months.  I will encourage the use of the patient telling his grief story about his daughter including encouraging him to bring pictures and memorabilia to help process his feelings including any feelings of guilt associated with her loss.  We will use cognitive behavioral therapy to help identify and change anxie ty producing thoughts and behavior patterns so as to improve his ability to better manage anxiety and stress, and manage thoughts and worrisome thinking contributing to anxiety.  We will  also refresh and encourage dialectical behavior therapy distress tolerance and mindfulness skills with the intention of reducing anxiety by 50% over the next 6 months. Progress: 30% Interventions: Cognitive Behavioral Therapy  Diagnosis:PTSD, Bi-Pol ar D/O  Plan: F/U/ appointments scheduled weekly.  Cecile Coder, [facility]                                                                                                                   Cecile Coder, [facility]               Cecile Coder, [facility]               Cecile Coder, [facility]                Cecile Coder, [facility]  [facility] Counselor/Therapist Progress Note  Patient ID: Koray Soter, MRN: [id number],    Date: 04/01/2024 This session was held via video teletherapy. The patient consented to the video teletherapy and was located in his home during this session. He is aware it is the responsibility of the patient to secure confidentiality on his end of the session. The provider was in a private home office for the duration of this session.   The patient arrived on time for her Caregility session  Time  Spent: 57 minutes, 2 PM to 2:57 PM  Treatment Type: Individual Therapy Reported Symptoms: anxiety, panic attacks The patient is still having significant back pain.  He cannot sit anywhere for very long and has to be careful where he sits.  There is still some unsteadiness although he did not report any falls over the past 2 weeks.  His biggest concern now is for his wife.  She has been having some difficulty breathing and they did a  test so they found a small hole on the back side of her heart.  She is meeting with her cardiologist again next week to see what possible treatment is for her.  He still thinks there  may be some issue with her diaphragm but she is meeting with the pulmonologist also.  He is doing what he can to help her.  His daughter's birthday is March 11 so he knows that is a difficult time for both he and his wife next week.  Typically they do som ething to remember her but because of the wife's current health issues they cannot go to [place] as they had planned so they are talking about what to do to remember and celebrate her birthday.  We will continue to work on coping skills for helping with his anxiety and the progression of his Lewy body dementia diagnosis as well as processing his grief. Mental Status Exam: Appearance:  Well Groomed  Behavior: Appropriate Mo tor: Pt. Reports some instability due to Lewey Body dementia Speech/Language:  Normal Rate Affect: Appropriate Mood: normal Thought process: normal Thought content:  WNL Sensory/Perceptual disturbances:  WNL Orientation: oriented to person, place, and situation Attention: Good Concentration: Good Memory: Impaired short term Fund of knowledge:  Good Insight:  Good Judgment:  Good Impulse Control: Good  Risk Assessment:  Danger to Self: No Self-injurious Behavior: No Danger to Others: No Duty to Warn:no Physical Aggression / Violence:No  Access to Firearms a concern: No  Gang Involvement:No  Treatment plan: Will employ cognitive behavioral therapy as well as grief and loss therapy for relief of anxiety and grief.  Goals of grief therapy or to have a healthier response to the loss of his daughter and less sadness as evidenced by patient report  in therapy notes as well as to help him feel less overwhelmed by her loss.  Goals are to see a 50% reduction in grief symptoms over the next 6 months.  I will encourage the use of the patient telling his grief story about his daughter including encouraging him to bring pictures and memorabilia to help process his feelings including any feelings of guilt associated  with her loss.  We will use cognitive behavioral therapy to help identify  and change anxiety producing thoughts and behavior patterns so as to improve his ability to better manage anxiety and stress, and manage thoughts and worrisome thinking contributing to anxiety.  We will also refresh and encourage dialectical behavior therapy distress tolerance and mindfulness  skills with the intention of reducing anxiety by 50% over the next 6 months. Progress: 30% Interventions: Cognitive Behavioral Therapy  Diagn osis:PTSD, Bi-Polar D/O  Plan: F/U/ appointments scheduled weekly.  Cecile Coder, [facility]                                                                                                                   Cecile Coder, [facility]  [facility] Counselor/Therapist Progress Note  Patient ID: Joshia Kitchings, MRN: [id number],    Date: 04/01/2024 This session was held via video teletherapy. The patient consented to the vi deo teletherapy and was located in his home during this session. He is aware it is the responsibility of the patient to secure confidentiality on his end of the session. The provider was in a private home office for the duration of this session.   The patient arrived on time for her Caregility session  Time Spent: 58 minutes, 2 PM to 2:58 PM  Treatment Type: Individual Therapy Reported Symptoms: anxiety, panic attacks A fairly d ifficult week because it was his daughter's birthday.  He said this year was more difficult than last year but he could not pinpoint why.  They did remember her about going out to a nice restaurant which is something her daughter always wanted to do.  He said they were able to laugh and joke about funny things that his daughter had said or done which is not something they were able to do this time last year so he saw that as healthy gri eving.  He knows that the time change threw things off a little bit for everybody and his wife still is not feeling well and his back has been bothering him.  He was not able to go to his first round of physical therapy this week because his back was bothering him more.  He has been thinking a lot more about his daughter which he feels comfortable with.  He is thankful that it is getting warmer and staying light longer so he can get bac k to some of the coping skills such as fishing that he has done in the past.   We will continue to work on coping skills for helping with his anxiety and the progression of his Lewy  body dementia diagnosis as well as processing his grief. Mental Status Exam: Appearance:  Well Groomed  Behavior: Appropriate Motor: Pt. Reports some instability due to Lewey Body dementia Speech/Language:  Normal Rate Affect: Appropriate Mood: n ormal Thought process: normal Thought content:  WNL Sensory/Perceptual disturbances:  WNL Orientation: oriented to person, place, and situation Attention: Good Concentration: Good Memory: Impaired short term Fund of knowledge:  Good Insight:  Good Judgment:  Good Impulse Control: Good  Risk Assessment: Danger to Self: No Self-injurious Behavior: No Danger to Others: No Duty to Warn:no Physical Aggression / Violence :No  Access to Firearms a concern: No  Gang Involvement:No  Treatment plan: Will employ cognitive behavioral therapy as well as grief and loss therapy for relief of anxiety and grief.  Goals of grief therapy or to have a healthier response to the loss of his daughter and less sadness as evidenced by patient report in therapy notes as well as to help him feel less overwhelmed by her loss.  Goals are to see a 50% reduction in grief sym ptoms over the next 6 months.  I will encourage the use of the patient telling his grief story about his daughter including encouraging him to bring pictures and memorabilia to help process his feelings including any feelings of guilt associated with her loss.  We will use cognitive behavioral therapy to help identify and change anxiety producing thoughts and behavior patterns so as to improve his ability to better manage anxiety and st ress, and manage thoughts and worrisome thinking contributing to anxiety.  We will also refresh and encourage dialectical behavior therapy distress  tolerance and mindfulness skills with the intention of reducing anxiety by 50% over the next 6 months. Progress: 30% Interventions: Cognitive Behavioral Therapy  Diagnosis:PTSD, Bi-Polar D/O  Plan: F/U/ appointments  scheduled weekly.  Cecile Coder, [facility]                                                                                                                    Cecile Coder, [facility]               Cecile Coder, [facility]               Cecile Coder, [facility]  [facility] Counselor/Therapist Progress Note  Patient ID: Angus Amini, MRN: [id number],    Date: 04/01/2024 This session was held via video teletherapy. The patient consented to the video teletherapy and was locat ed in his home during this session. He is aware it is the responsibility of the patient to secure confidentiality  on his end of the session. The provider was in a private home office for the duration of this session.   The patient arrived on time for her Caregility session  Time Spent: 57 minutes, 2 PM to 2:57 PM  Treatment Type: Individual Therapy Reported Symptoms: anxiety, panic attacks The patient is still having significant  back pain.  He cannot sit anywhere for very long and has to be careful where he sits.  There is still some unsteadiness although he did not report any falls over the past 2 weeks.  His biggest concern now is for his wife.  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  She is meeting with her cardiologist again next week to see what possible treatment is for  her.  He still thinks there may be some issue with her diaphragm but she is meeting with the pulmonologist also.  He is doing what he can to help her.  His daughter's birthday is March 11 so he knows that is a difficult time for both he and his wife next week.  Typically they do something to remember her but because of the wife's current health issues they cannot go to [place] as they had planned so they are talking about what Stephen Myers o do to remember and celebrate her birthday.  We will continue to work on coping skills for helping with his anxiety and the progression of his Lewy body dementia diagnosis as well as processing his grief. Mental Status Exam: Appearance:  Well Groomed  Behavior: Appropriate Motor: Pt. Reports some instability due to Lewey Body dementia Speech/Language:  Normal Rate Affect: Appropriate Mood: normal Thought process: normal Th ought content:  WNL Sensory/Perceptual disturbances:  WNL Orientation: oriented to person, place, and situation Attention: Good Concentration: Good Memory: Impaired short term Fund of knowledge:  Good Insight:  Good Judgment:  Good Impulse Control: Good  Risk Assessment: Danger to  Self: No Self-injurious Behavior: No Danger to Others: No Duty to Warn:no Physical Aggression / Violence:No  Access to Firearms a concern : No  Gang Involvement:No  Treatment plan: Will employ cognitive behavioral therapy as well as grief and loss therapy for relief of anxiety and grief.  Goals of grief therapy or to have a healthier response to the loss of his daughter and less sadness as evidenced by patient report in therapy notes as well as to help him feel less overwhelmed by her loss.  Goals are to see a 50% reduction in grief symptoms over the next 6 months.  I w ill encourage the use of the patient telling his grief story about his daughter including encouraging him to bring pictures and memorabilia to help process his feelings including any feelings of guilt associated with her loss.  We will use cognitive behavioral therapy to help identify and change anxiety producing thoughts and behavior patterns so as to improve his ability to better manage anxiety and stress, and manage thoughts and worr isome thinking contributing to anxiety.  We will also refresh and encourage dialectical behavior therapy distress tolerance and mindfulness  skills with the intention of reducing anxiety by 50% over the next 6 months. Progress: 30% Interventions: Cognitive Behavioral Therapy  Diagnosis:PTSD, Bi-Polar D/O  Plan: F/U/ appointments scheduled weekly.  Cecile Coder, [facility]                                                                                                                    Cecile Coder, [facility]  [facility] Counselor/Therapist Progress Note  Patient ID: Azell Bill, MRN: [id number],    Date: 04/01/2024 This session was held via video teletherapy. The patient consented to the video teletherapy and was located in his home during this session. He is aware it is the responsibility of the patient to secure confidentiality on his end o f the session. The provider was in a private home office for the duration of this session.   The patient arrived on time for her Caregility session  Time Spent: 57 minutes, 2 PM to 2:57 PM  Treatment Type: Individual Therapy Reported Symptoms: anxiety, panic attacks  We will continue to work on coping skills for helping with his anxiety and the progression of his Lewy body dementia diagnosis as well as processing his grief. Me ntal Status Exam: Appearance:  Well Groomed  Behavior: Appropriate Motor: Pt. Reports some instability due to Lewey Body dementia Speech/Language:  Normal  Rate Affect: Appropriate Mood: normal Thought process: normal Thought content:  WNL Sensory/Perceptual disturbances:  WNL Orientation: oriented to person, place, and situation Attention: Good Concentration: Good Memory: Impaired short term Fund of knowledge:  Good I nsight:  Good Judgment:  Good Impulse Control: Good  Risk Assessment: Danger to Self: No Self-injurious Behavior: No Danger to Others: No Duty to Warn:no Physical Aggression / Violence:No  Access to Firearms a concern: No  Gang Involvement:No  Treatment plan: Will employ cognitive behavioral therapy as well as grief and loss therapy for relief of anxiety and grief.  Goals of grief therapy or to have a healthier response to  the loss of his daughter and less sadness as evidenced by patient report in therapy notes as well as to help him feel less overwhelmed by her loss.  Goals are to see a 50% reduction in grief symptoms over the next 6 months.  I will encourage the use of the patient telling his grief story about his daughter including encouraging him to bring pictures and memorabilia to help process his feelings including any feelings of guilt associated  with her loss.  We will use cognitive behavioral therapy to help identify and change anxiety producing thoughts and behavior patterns so as to improve his ability to better manage anxiety and stress, and manage thoughts and worrisome thinking contributing to anxiety.  We will also refresh and encourage dialectical behavior therapy distress tolerance and mindfulness skills with the intention of reducing anxiety by 50% over the next 6 mo nths. Progress: 30% Interventions: Cognitive Behavioral Therapy  Diagnosis:PTSD, Bi-Polar D/O  Plan: F/U/ appointments scheduled weekly.  Cecile Coder,  [facility]                                                                                                                   Cecile Coder, [facility]                Cecile Coder, [facility]               Cecile Coder, [facility]  Cecile Coder, [facility]               Cecile Coder, [facility]               Cecile Coder, [facility]               Cecile Coder, [facility]  [facility] Counselor/Therapist Progress Note  Patient ID: Anav Lammert, MRN: [id number],    Date: 04/01/2024 This session was held via video teletherapy. The  patient consented to the video teletherapy and was located in his home during this session. He is  aware it is the responsibility of the patient to secure confidentiality on his end of the session. The provider was in a private home office for the duration of this session.   The patient arrived on time for her Caregility session  Time Spent: 39 minutes, 2 PM to 2:39 PM  Treatment Type: Individual Therapy Reported Symptoms: anxiety, panic attacks The patient was not feeling well today.  He started feeling really earlier in th e week having a fever bad headaches body aches.  He felt a little better yesterday but feels a little bit of a resurgence in headaches and body aches and is running a low-grade fever.  His wife is feeling some of the same symptoms.  For the most part he says he is doing well.  They did increase his memory medication because they were starting to see some increasing decline in short-term memory and he said until recently his long-term me mory had been good but he was starting to see a difference in how he used to markers to remember what happened at certain times historically.  He feels the medication has helped and has brightened his mood a little also.  He is still trying to walk every day and fish when he can and has enjoyed watching the [employer] as a good distraction.  He reports that he feels he is managing mood fairly well.  He has an appointment in  2 weeks.  He does contract for safety having no thoughts of hurting himself or anyone else. We will continue to work on coping skills for helping with his anxiety and the progression of his Lewy body dementia diagnosis as well as processing his grief. Mental Status Exam: Appearance:  Well Groomed  Behavior: Appropriate Motor: Pt. Reports some instability due to Lewey Body dementia Speech/Language:  Normal Rate Affect: Approp riate Mood: normal Thought process: normal Thought content:   WNL Sensory/Perceptual disturbances:  WNL Orientation: oriented to person, place, and situation Attention: Good Concentration: Good Memory: Impaired short term Fund of knowledge:  Good Insight:  Good Judgment:  Good Impulse Control: Good  Risk Assessment: Danger to Self: No Self-injurious Behavior: No Danger to Others: No Duty to Warn:no Physical Aggress ion / Violence:No  Access to Firearms a concern: No  Gang Involvement:No  Treatment plan: Will employ cognitive behavioral therapy as well as grief and loss therapy for relief of anxiety and grief.  Goals of grief therapy or to have a healthier response to the loss of his daughter and less sadness as evidenced by patient report in therapy notes as well as to help him feel less overwhelmed by her loss.  Goals are to see a 50% reductio n in grief symptoms over the next 6 months.  I will encourage the use of the patient telling his grief story about his daughter including encouraging him to bring pictures and memorabilia to help process his feelings including any feelings of guilt associated with her loss.  We will use cognitive behavioral therapy to help identify and change anxiety producing thoughts and behavior patterns so as to improve his ability to better manage  anxiety and stress, and manage thoughts and worrisome thinking contributing to anxiety.  We will also refresh  and encourage dialectical behavior therapy distress tolerance and mindfulness skills with the intention of reducing anxiety by 50% over the next 6 months. Progress: 30% Interventions: Cognitive Behavioral Therapy  Diagnosis:PTSD, Bi-Polar D/O  Plan: F/U/ appointments scheduled weekly.  Cecile Coder, [facility]                                                                                                                    Cecile Coder,  [facility]  [facility] Counselor/Therapist Progress Note  Patient ID: Dusten Ellinwood, MRN: [id number],    Date: 04/01/2024 This session was held via video teletherapy. The patient consented to the video teletherapy and was located in his home during this session. He is aware it is the responsibility of th e patient to secure confidentiality on his end of the session. The provider was in a private home office for the duration of this session.   The patient arrived on time for her Caregility session  Time Spent: 58 minutes, 2 PM to 2:58 PM  Treatment Type: Individual Therapy Reported Symptoms: anxiety, panic attacks A fairly difficult week because it was his daughter's birthday.  He said this year was more  difficult than last year  but he could not pinpoint why.  They did remember her about going out to a nice restaurant which is something her daughter always wanted to do.  He said they were able to laugh and joke about funny things that his daughter had said or done which is not something they were able to do this time last year so he saw that as healthy grieving.  He knows that the time change threw things off a little bit for everybody and his wife still is not  feeling well and his back has been bothering him.  He was not able to go to his first round of physical therapy this week because his back was bothering him more.  He has been thinking a lot more about his daughter which he feels comfortable with.  He is thankful that it is getting warmer and staying light longer so he can get back to some of the coping skills such as fishing that he has done in the past.   We will continue to work  on coping skills for helping with his anxiety and the progression of his Lewy body dementia diagnosis as well as processing his grief. Mental Status Exam: Appearance:  Well Groomed  Behavior: Appropriate Motor: Pt. Reports some instability due to Lewey Body dementia Speech/Language:  Normal Rate Affect: Appropriate Mood: normal Thought process: normal Thought content:  WNL Sensory/Perceptual disturbances:  WNL Orientation : oriented to person, place, and situation Attention: Good Concentration: Good Memory: Impaired short term Fund of knowledge:  Good Insight:  Good Judgment:  Good Impulse Control: Good  Risk Assessment: Danger to Self: No Self-injurious Behavior: No Danger to Others: No Duty to Warn:no Physical Aggression / Violence:No  Access to Firearms a concern: No  Gang Involvement:No  Treatment plan: Will employ cognitive behavi oral therapy as well as grief and loss therapy for relief of anxiety and grief.  Goals of grief therapy or to have a healthier response to the loss of his  daughter and less sadness as evidenced by patient report in therapy notes as well as to help him feel less overwhelmed by her loss.  Goals are to see a 50% reduction in grief symptoms over the next 6 months.  I will encourage the use of the patient telling his grief story about his da ughter including encouraging him to bring pictures and memorabilia to help process his feelings including any feelings of guilt associated with her loss.  We will use cognitive behavioral therapy to help identify and change anxiety producing thoughts and behavior patterns so as to improve his ability to better manage anxiety and stress, and manage thoughts and worrisome thinking contributing to anxiety.  We will also refresh and encoura ge dialectical behavior therapy  distress tolerance and mindfulness skills with the intention of reducing anxiety by 50% over the next 6 months. Progress: 30% Interventions: Cognitive Behavioral Therapy  Diagnosis:PTSD, Bi-Polar D/O  Plan: F/U/ appointments scheduled weekly.  Cecile Coder, [facility]                                                                                                                    Cecile Coder, [facility]               Cecile Coder, [facility]               Cecile Coder, [facility]  [facility] Counselor/Therapist Progress Note  Patient ID: Frederik Standley, MRN: [id number],    Date: 04/01/2024 This session was held via video teletherapy. The patient consented to the video teletherapy and was located in his home during this session. He is aware it is the responsibility of the patient to secure confidentiality on hi s end of the session. The provider was in a private home office for the duration of this session.   The patient arrived on time for her Caregility session  Time Spent: 1:05 PM until 2 PM, 55 minutes Treatment Type: Individual Therapy Reported Symptoms: anxiety, panic attacks The patient said he came in from his walk this morning and saw what he thought was some sort of tic from his wife.  It was not the first time it happened an d he wonders if it might be tardive dyskinesia because of some new medications that she is on.  She has a call into the doctor right now and hopes to hear something back soon.  They  have narrowed down to the fact they think most of her discomfort in breathing is coming from the diaphragm and they are looking for alternatives for treatment.  The patient continues to do those things which help him cope including walking, fishing when he  can, spending time with his son and daughter-in-law and grandson.  He also has a way of continued coping with grief he is writing letters to his daughters as a way of expressing his feelings and says that is very beneficial.  He appears to be grieving in a healthy way but he knows that he to year anniversary of her death is coming up and there is some grief there but says it is not overwhelming.  We will continue to work on coping sk ills for helping with his anxiety and the progression of his Lewy body dementia diagnosis as well as processing his grief. Mental Status Exam: Appearance:  Well Groomed  Behavior: Appropriate Motor: Pt. Reports some instability due to Lewey Body dementia Speech/Language:  Normal Rate Affect: Appropriate Mood: normal Thought process: normal Thought content:  WNL Sensory/Perceptual disturbances:  WNL Orientation: oriented Stephen Myers o person, place, and situation Attention: Good Concentration: Good Memory: Impaired short term Fund of knowledge:  Good Insight:  Good Judgment:  Good Impulse Control: Good  Risk Assessment: Danger to Self: No Self-injurious Behavior: No Danger to Others: No Duty to Warn:no Physical Aggression / Violence:No  Access to Firearms a concern: No  Gang Involvement:No  Treatment plan: Will employ cognitive behavioral therapy  as well as grief and loss therapy for relief of anxiety and grief.  Goals of grief therapy or to have a healthier response to the loss of his daughter and less sadness as evidenced by patient report in therapy notes as well as to help him feel less overwhelmed by her loss.  Goals are to see a 50% reduction in grief symptoms over the next 6 months.  I will  encourage the use of the patient telling his grief story about his daughter inclu ding encouraging him to bring pictures and memorabilia to help process his feelings including any feelings of guilt associated with her loss.  We will use cognitive behavioral therapy to help identify and change anxiety producing thoughts and behavior patterns so as to improve his ability to better manage anxiety and stress, and manage thoughts and worrisome thinking contributing to anxiety.  We will also refresh and encourage dialectic al behavior therapy distress tolerance  and mindfulness skills with the intention of reducing anxiety by 50% over the next 6 months. Progress: 30% Interventions: Cognitive Behavioral Therapy  Diagnosis:PTSD, Bi-Polar D/O  Plan: F/U/ appointments scheduled weekly.  Cecile Coder, [facility]                                                                                                                    Cecile Coder, [facility]  [place]  [facility] Counselor/Therapist Progress Note  Patient ID: Trew Sunde, MRN: [id number],    Date: 04/01/2024 This session was held via video teletherapy. The patient consented to the video teletherapy and was located in his home during this session. He is aware it is the responsibility of the patient to secure confidentiality on his end of the session. The provider was in a private home office for the duration of this ses sion.   The patient arrived on time for her Caregility session  Time Spent: 59 minutes, 2 PM to 2:59 PM  Treatment Type: Individual Therapy Reported Symptoms: anxiety, panic attacks The patient continues to present with physical pain and both his back and his knee.  He started with a new physical therapist and told him that there was a lot of arthritis in both knees and or certain things he cannot do but they had him do that any way and now for the past 3 days he has been in significant knee pain.  He has continued to walk because he knows that is good for him in various ways but says it has been painful.  His wife also was not feeling well but she is going back to the doctor next week to have an ablation and hopes that will bring her some relief.  For the most part he has been at the last couple weeks reaching back out and hearing from some old friends as well  as time with his son and son's family.  He says that his wife's cousin is coming down for a weekend soon and they enjoy time with her and that he will go to his brother's house  sometime in the next few weeks.  He recognizes the need for people that he cares about in helping with his depression. We will continue to work on coping skills for helping with his anxiety and the progression of his Lewy body dementia diagnosis as well as proc essing his grief. Mental Status Exam: Appearance:  Well Groomed  Behavior: Appropriate Motor: Pt. Reports some instability due to Lewey Body dementia Speech/Language:  Normal Rate Affect: Appropriate Mood: normal Thought process: normal Thought content:  WNL Sensory/Perceptual disturbances:  WNL Orientation: oriented to person, place, and situation Attention: Good Concentration: Good Memory: Impaired short term Fund o f knowledge:  Good Insight:  Good Judgment:  Good Impulse Control: Good  Risk Assessment: Danger to Self: No Self-injurious Behavior: No Danger to Others: No Duty to Warn:no Physical Aggression / Violence:No  Access to Firearms a concern: No  Gang Involvement:No  Treatment plan: Will employ cognitive behavioral therapy as well as grief and loss therapy for relief of anxiety and grief.  Goals of grief therapy or to have a  healthier response to the loss of his daughter and less sadness as evidenced by patient report in therapy notes as well as to help him feel less overwhelmed by her loss.  Goals are to see a 50% reduction in grief symptoms over the next 6 months.  I will encourage the use of the patient telling his grief story about his daughter including encouraging him to bring pictures and memorabilia to help process his feelings including any feeling s of guilt associated with her loss.  We will use cognitive behavioral therapy to help identify and change anxiety producing thoughts and behavior patterns so as to improve his ability to better manage anxiety and stress, and manage thoughts and worrisome thinking contributing to anxiety.  We will also  refresh and encourage dialectical behavior therapy distress  tolerance and mindfulness skills with the intention of reducing anxiety by 5 0% over the next 6 months. Progress: 30% Interventions: Cognitive Behavioral Therapy  Diagnosis:PTSD, Bi-Polar D/O  Plan: F/U/ appointments scheduled weekly.  Cecile Coder, [facility]                                                                                                                   Cecile Coder, [facility]                Cecile Coder, [facility]               Cecile Coder, [facility]               Cecile Coder, [facility]  [facility] Counselor/Therapist Progress Note  Patient ID:  Jordon Kristiansen, MRN: [id number],    Date: 04/01/2024 This session was held via video teletherapy. The patient consented to the video teletherapy and was located in his home during this session. He is aware it is the responsibility of the patient to secure confidentiality on his end of the session. The provider was in a private ho me office for the duration of this session.   The patient arrived on time for her Caregility session  Time Spent: 57 minutes, 2 PM to 2:57 PM  Treatment Type: Individual Therapy Reported Symptoms: anxiety, panic attacks The patient is still having significant back pain.  He cannot sit anywhere for very long and has to be careful where he sits.  There is still some unsteadiness although he did not report any falls over the past 2  weeks.  His biggest concern now is for his wife.  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  She is meeting with her cardiologist again next week to see what possible treatment is for her.  He still thinks there may be some issue with her diaphragm but she is meeting with the pulmonologist also.  He is doing what he can to help her.  His daughter's birt hday is March 11 so he knows that is a difficult time for both he and his wife next week.  Typically they do something to remember her but because of the wife's current health issues they cannot go to [place] as they had planned so they are talking about what to do to remember and celebrate her birthday.  We will continue to work on coping skills for helping with his anxiety and the progression of his Lewy body dementia diagnosi s as well as processing his grief. Mental Status Exam: Appearance:  Well Groomed  Behavior: Appropriate Motor: Pt. Reports some instability due to Lewey Body dementia Speech/Language:  Normal Rate Affect: Appropriate Mood: normal Thought process: normal Thought content:   WNL Sensory/Perceptual disturbances:  WNL Orientation: oriented to person, place, and situation Attention: Good Concentration: Good Memory: Impaired s hort term Fund of knowledge:  Good Insight:  Good Judgment:  Good Impulse Control: Good  Risk Assessment: Danger to Self: No Self-injurious Behavior: No Danger to Others: No Duty to Warn:no Physical Aggression / Violence:No  Access to Firearms a concern: No  Gang Involvement:No  Treatment plan: Will employ cognitive behavioral therapy as well as grief and loss therapy for relief of anxiety and grief.  Goals of grief ther apy or to have a healthier response to the loss of his daughter and less sadness as evidenced by patient report in therapy notes as well as to help him feel less overwhelmed by her loss.  Goals are to see a 50% reduction in grief symptoms over the next 6 months.  I will encourage the use of the patient telling his grief story about his daughter including encouraging him to bring pictures and memorabilia to help process his feelings incl uding any feelings of guilt associated with her loss.  We will use cognitive behavioral therapy to help identify and change anxiety producing thoughts and behavior patterns so as to improve his ability to better manage anxiety and stress, and manage thoughts and worrisome thinking contributing to anxiety.  We will also refresh and encourage dialectical behavior therapy distress tolerance and mindfulness skills  with the intention of redu cing anxiety by 50% over the next 6 months. Progress: 30% Interventions: Cognitive Behavioral Therapy  Diagnosis:PTSD, Bi-Polar D/O  Plan: F/U/ appointments scheduled weekly.  Cecile Coder, [facility]                                                                                                                   Cecile Coder,  [facility]  [facility] Counselor/Therapist Progress Note  Patient ID: Rollan Roger, MR N: [id number],    Date: 04/01/2024 This session was held via video teletherapy. The patient consented to the video teletherapy and was located in his home during this session. He is aware it is the responsibility of the patient to secure confidentiality on his end of the session. The provider was in a private home office for the duration of this session.   The patient arrived on time for her Caregility session  Time Spent: 58 minu tes, 2 PM to 2:58 PM  Treatment Type: Individual Therapy Reported Symptoms: anxiety, panic attacks A fairly difficult week because it was his daughter's birthday.  He said this year was more  difficult than last year but he could not pinpoint why.  They did remember her about going out to a nice restaurant which is something her daughter always wanted to do.  He said they were able to laugh and joke about funny things that his daughte r had said or done which is not something they were able to do this time last year so he saw that as healthy grieving.  He knows that the time change threw things off a little bit for everybody and his wife still is not feeling well and his back has been bothering him.  He was not able to go to his first round of physical therapy this week because his back was bothering him more.  He has been thinking a lot more about his daughter which  he feels comfortable with.  He is thankful that it is getting warmer and staying light longer so he can get back to some of the coping skills such as fishing that he has done in the past.   We will continue to work on coping skills for helping with his anxiety and the progression of his Lewy body dementia diagnosis as well as processing his grief. Mental Status Exam: Appearance:  Well Groomed  Behavior: Appropriate Motor: Pt.  Reports some instability due to Lewey Body dementia Speech/Language:  Normal Rate Affect: Appropriate Mood: normal Thought process: normal Thought content:  WNL Sensory/Perceptual disturbances:  WNL Orientation: oriented to person, place, and situation Attention: Good Concentration: Good Memory: Impaired short term Fund of knowledge:  Good Insight:  Good Judgment:  Good Impulse Control: Good  Risk Assessment: Danger  to Self: No Self-injurious Behavior: No Danger to Others: No Duty to Warn:no Physical Aggression / Violence:No  Access to Firearms a concern: No  Gang Involvement:No  Treatment plan: Will employ cognitive behavioral therapy as well as grief and loss therapy for relief of anxiety and grief.  Goals of grief therapy or to have a healthier response to the loss of his  daughter and less sadness as evidenced by patient report in therap y notes as well as to help him feel less overwhelmed by her loss.  Goals are to see a 50% reduction in grief symptoms over the next 6 months.  I will encourage the use of the patient telling his grief story about his daughter including encouraging him to bring pictures and memorabilia to help process his feelings including any feelings of guilt associated with her loss.  We will use cognitive behavioral therapy to help identify and chan ge anxiety producing thoughts and behavior patterns so as to improve his ability to better manage anxiety and stress, and manage thoughts and worrisome thinking contributing to anxiety.  We will also refresh and encourage dialectical behavior therapy  distress tolerance and mindfulness skills with the intention of reducing anxiety by 50% over the next 6 months. Progress: 30% Interventions: Cognitive Behavioral Therapy  Diagnosis:PTSD , Bi-Polar D/O  Plan: F/U/ appointments scheduled weekly.  Cecile Coder, [facility]                                                                                                                   Cecile Coder, [facility]               Cecile Coder, [facility]               Cecile Coder, [facility]  [facility] Counselor/Therapist Progress Note  Patient ID: Jahquez Steffler, MRN: [id number],    Date: 5/30 /2025 This session was held via video teletherapy. The patient consented to the video teletherapy and was located in his home during this session. He is aware it is the responsibility of the patient to secure confidentiality on his end of the session. The provider was in a private home office for the duration of this session.   The patient arrived on time for her Caregility session  Time Spent: 57 minutes, 2 PM to 2:57 PM  Treatm ent Type: Individual Therapy Reported Symptoms: anxiety, panic attacks The patient is still having significant back pain.  He cannot sit anywhere for very long and has to be careful where he sits.  There is still some unsteadiness although he did not report any falls over the past 2 weeks.  His biggest concern now is for his wife.  She has been having some difficulty breathing and they did a test so they found a small hole on the back  side of her heart.  She is meeting with her cardiologist again next week to see what possible treatment is for her.  He still thinks there may be some issue with her diaphragm but she  is meeting with the pulmonologist also.  He is doing what he can to help her.  His daughter's birthday is March 11 so he knows that is a difficult time for both he and his wife next week.  Typically they do something to remember her but because of the w ife's current health issues they cannot go to [place] as they had planned so they are talking about what to do to remember and celebrate her birthday.  We will continue to work on coping skills for helping with his anxiety and the progression of his Lewy body dementia diagnosis as well as processing his grief. Mental Status Exam: Appearance:  Well Groomed  Behavior: Appropriate Motor: Pt. Reports some instability due to Le wey Body dementia Speech/Language:  Normal Rate Affect: Appropriate Mood: normal Thought process: normal Thought content:  WNL Sensory/Perceptual disturbances:  WNL Orientation: oriented to person, place, and situation Attention: Good Concentration: Good Memory: Impaired short term Fund of knowledge:  Good Insight:  Good Judgment:  Good Impulse Control: Good  Risk Assessment: Danger to Self: No Self-injurious Behav ior: No Danger to Others: No Duty to Warn:no Physical Aggression / Violence:No  Access to Firearms a concern: No  Gang Involvement:No  Treatment plan: Will employ cognitive behavioral therapy as well as grief and loss therapy for relief of anxiety and grief.  Goals of grief therapy or to have a healthier response to the loss of his daughter and less sadness as evidenced by patient report in therapy notes as well as to help him fee l less overwhelmed by her loss.  Goals are to see a 50% reduction in grief symptoms over the next 6 months.  I will encourage the use of the patient telling his grief story about his daughter including encouraging him to bring pictures and memorabilia to help process his feelings including any feelings of guilt associated with her loss.  We will use cognitive  behavioral therapy to help identify and change anxiety producing thoughts and  behavior patterns so as to improve his ability to better manage anxiety and stress, and manage thoughts and worrisome thinking contributing to anxiety.  We will also refresh and encourage dialectical behavior therapy distress tolerance and mindfulness  skills with the intention of reducing anxiety by 50% over the next 6 months. Progress: 30% Interventions: Cognitive Behavioral Therapy  Diagnosis:PTSD, Bi-Polar D/O  Plan: F/U/ appoi ntments scheduled weekly.  Cecile Coder, [facility]                                                                                                                   Cecile Coder, [facility]  [facility] Counselor/Therapist Progress Note  Patient ID: Jakari Sada, MRN: [id number],    Date: 04/01/2024 This session was held via video teletherapy. The patient consented to the video teletherapy and was located in his home  during this session. He is aware it is the responsibility of the patient to secure confidentiality on his end of the session. The provider was in a private home office for the duration of this session.   The patient arrived on time for her Caregility session  Time Spent: 57 minutes, 2 PM to 2:57 PM  Treatment Type: Individual Therapy Reported Symptoms: anxiety, panic attacks  The patient reported that he had not been sleeping  well so he did speak with his psychiatrist about adjusting some meds.  Initially they adjusted too much up and he said he felt groggy for the entire next day but they have found a combination now where he feels like he is getting more rested and only feels a little drowsy shortly after waking up..  There have been a couple times where he stumbled and one of the times he felt down but did not get hurt the other times he was able to catch  himself.  He is trying to stay as active as he can walking daily and fishing with his wife when he can.  His biggest anxiety now is his wife has an upcoming surgery on her diaphragm in 2 weeks so he knows that will be a fairly significant recovery time but also sees the difficulties she is having now and wants her to get some relief..  His son and daughter-in-law will be supports for them also.  He continues to speak with friends and f amily members as encouragement.  He also feels that he is grieving appropriately writing to his daughter on a regular basis about the things that are going on in his and his wife's  lives.  We will continue to work on coping skills for helping with his anxiety and the progression of his Lewy body dementia diagnosis as well as processing his grief. Mental Status Exam: Appearance:  Well Groomed  Behavior: Appropriate Motor: Pt. Rep orts some instability due to Lewey Body dementia Speech/Language:  Normal Rate Affect: Appropriate Mood: normal Thought process: normal Thought content:  WNL Sensory/Perceptual disturbances:  WNL Orientation: oriented to person, place, and situation Attention: Good Concentration: Good Memory: Impaired short term Fund of knowledge:  Good Insight:  Good Judgment:  Good Impulse Control: Good  Risk Assessment: Danger to  Self: No Self-injurious Behavior: No Danger to Others: No Duty to Warn:no Physical Aggression / Violence:No  Access to Firearms a concern: No  Gang Involvement:No  Treatment plan: Will employ cognitive behavioral therapy as well as grief and loss therapy for relief of anxiety and grief.  Goals of grief therapy or to have a healthier response to the loss of his daughter and less sadness as evidenced by patient report in therapy n otes as well as to help him feel less overwhelmed by her loss.  Goals are to see a 50% reduction in grief symptoms over the next 6 months.  I will encourage the use of the patient telling his grief story about his daughter including encouraging him to bring pictures and memorabilia to help process his feelings including any feelings of guilt associated with her loss.  We will use cognitive behavioral therapy to help identify and change  anxiety producing thoughts and behavior patterns so as to improve his ability to  better manage anxiety and stress, and manage thoughts and worrisome thinking contributing to anxiety.  We will also refresh and encourage dialectical behavior therapy distress tolerance and mindfulness skills with the intention of reducing anxiety by 50% over the next 6  months. Progress: 30% Interventions: Cognitive Behavioral Therapy  Diagnosis:PTSD, B i-Polar D/O  Plan: F/U/ appointments scheduled weekly.  Cecile Coder, [facility]                                                                                                                   Cecile Coder, [facility]               Cecile Coder, [facility]               Cecile Coder, [facility]                Cecile Coder, [facility]               Cecile Coder, [facility]               Cecile Coder, [facility]               Cecile Coder, [facility]               Cecile Coder, [facility]  [place] [facility] Counselor/Therapist Progress Note  Patient ID: Flynn Gwyn, MRN: [id number],    Date: 04/01/2024 This session was held via video teletherapy. The patient consented to the video teletherapy and was located in his home during this session. He is aware it is the responsibility of  the patient to secure confidentiality on his end of the session. The provider was in a private home office for the duration of this session.   The patient arrived on time for her Caregility session  Time Spent: 59 minutes, 2 PM to 2:59 PM  Treatment Type: Individual Therapy Reported Symptoms: anxiety, panic attacks The patient said he jokingly said to his wife that he had not had any pain issues in the cervical area and then a f ew days ago he started having pain in his neck and shoulders.  He now sits related to degenerative disk and has some consistent pain somewhere in his spine with that but jokingly says it just depends on Wednesday which part of his body is decides to show up and hurt.  He is doing the best that he can.  He is still walking which he knows is good for him but does not have right now the strength to do as much in the morning as he has done  in the past so he is denying that to 3 times a day for consistency.  He is still doing some positive things and that he is staying in touch with his brother and other family members.  He is reconnecting with friends from high school and he is which she is enjoying.  One of them has had a similar experience in that she lost her son in the same way the patient lost his daughter.  He said they did not think and Easter in which they set a p lace for his daughter at the table and put her picture there.  He initially had reservations about doing that he and his wife had made everyone feel as if she was really there with him.   He still feels that he is grieving in a healthy way.  He and his wife have always taken a trip around both of their birthdays which are 2 days apart.  That is now close to his daughter died so they have started taking some of her rashes with him and spr inkling them where they go as a way of remembrance.  They are deciding what that looks like this year in July.  He does contract for safety having no thoughts of hurting himself or anyone else. We will continue to work on coping skills for helping with his anxiety and the progression of his Lewy body dementia diagnosis as well as processing his grief. Mental Status Exam: Appearance:  Well Groomed  Behavior: Appropriate Motor: P Stephen Myers. Reports some instability due to Lewey Body dementia Speech/Language:  Normal Rate Affect: Appropriate Mood: normal Thought process: normal Thought content:  WNL Sensory/Perceptual disturbances:  WNL Orientation: oriented to person, place, and situation Attention: Good Concentration: Good Memory: Impaired short term Fund of knowledge:  Good Insight:  Good Judgment:  Good Impulse Control: Good  Risk Assessment: Marikay Show er to Self: No Self-injurious Behavior: No Danger to Others: No Duty to Warn:no Physical Aggression / Violence:No  Access to Firearms a concern: No  Gang Involvement:No  Treatment plan: Will employ cognitive behavioral therapy as well as grief and loss therapy for relief of anxiety and grief.  Goals of grief therapy or  to have a healthier response to the loss of his daughter and less sadness as evidenced by patient report in the rapy notes as well as to help him feel less overwhelmed by her loss.  Goals are to see a 50% reduction in grief symptoms over the next 6 months.  I will encourage the use of the patient telling his grief story about his daughter including encouraging him to bring pictures and memorabilia to help process his feelings including any feelings of guilt associated with her  loss.  We will use cognitive behavioral therapy to help identify and c hange anxiety producing thoughts and behavior patterns so as to improve his ability to better manage anxiety and stress, and manage thoughts and worrisome thinking contributing to anxiety.  We will also refresh and encourage dialectical behavior therapy distress tolerance and mindfulness skills with the intention of reducing anxiety by 50% over the next 6 months. Progress: 30% Interventions: Cognitive Behavioral Therapy  Diagnosis:P TSD, Bi-Polar D/O  Plan: F/U/ appointments scheduled weekly.  Cecile Coder, [facility]                                                                                                                   Cecile Coder, [facility]  [place] [facility] Counselor/Therapist Progress Note  Patient ID: Tirso Laws, MRN: [id number],    Date: 04/01/2024 This session was held via video teletherapy. The patient consented to the video te letherapy and was located in his home during this session. He is aware it is the responsibility of the patient to secure confidentiality on his end of the session. The provider was in a private home office for the duration of this session.   The patient arrived on time for her Caregility session  Time Spent: 58 minutes, 2 PM to 2:58 PM  Treatment Type: Individual Therapy Reported Symptoms: anxiety, panic attacks A fairly difficu lt week because it was his daughter's birthday.  He said this year was more difficult than last year but he could not pinpoint why.  They did remember her about going out to a nice restaurant which is something her daughter always wanted to do.  He said they were able to laugh and joke about funny things that his daughter had said or done which is not something they were able to do this time last year so he saw that as healthy grieving.   He knows that the time change threw things off a little bit for everybody and his wife still is not feeling well and his back has been bothering him.  He was not able to go to his first round of physical therapy this week because his back was bothering him more.  He has been thinking a lot more about his daughter which he feels comfortable with.  He is thankful that it is getting warmer and staying light longer so he can get back to s ome of the coping skills such as fishing that he has done in the past.   We will continue to work on coping skills for helping with his anxiety and the progression of his Lewy body  dementia diagnosis as well as processing his grief. Mental Status Exam: Appearance:  Well Groomed  Behavior: Appropriate Motor: Pt. Reports some instability due to Lewey Body dementia Speech/Language:  Normal Rate Affect: Appropriate Mood: normal  Thought process: normal Thought content:  WNL Sensory/Perceptual disturbances:  WNL Orientation: oriented to person, place, and situation Attention: Good Concentration: Good Memory: Impaired short term Fund of knowledge:  Good Insight:  Good Judgment:  Good Impulse Control: Good  Risk Assessment: Danger to Self: No Self-injurious Behavior: No Danger to Others: No Duty to Warn:no Physical Aggression / Violence:No   Access to Firearms a concern: No  Gang Involvement:No  Treatment plan: Will employ cognitive behavioral therapy as well as grief and loss therapy for relief of anxiety and grief.  Goals of grief therapy or to have a healthier response to the loss of his daughter and less sadness as evidenced by patient report in therapy notes as well as to help him feel less overwhelmed by her loss.  Goals are to see a 50% reduction in grief symptoms  over the next 6 months.  I will encourage the use of the patient telling his grief story about his daughter including encouraging him to bring pictures and memorabilia to help process his feelings including any feelings of guilt associated with her loss.  We will use cognitive behavioral therapy to help identify and change anxiety producing thoughts and behavior patterns so as to improve his ability to better manage anxiety and stress,  and manage thoughts and worrisome thinking contributing to anxiety.  We will also refresh and encourage dialectical behavior therapy distress  tolerance and mindfulness skills with the intention of reducing anxiety by 50% over the next 6 months. Progress: 30% Interventions: Cognitive Behavioral Therapy  Diagnosis:PTSD, Bi-Polar D/O  Plan: F/U/ appointments  scheduled weekly.  Cecile Coder, [facility]                                                                                                                    Cecile Coder, [facility]               Cecile Coder, [facility]               Cecile Coder, [facility]  [place] [facility] Counselor/Therapist Progress Note  Patient ID: Fedrick Cefalu, MRN: [id number],    Date: 04/01/2024 This session was held via video teletherapy. The patient consented to the video teletherapy and was located in his home dur ing this session. He is aware it is the responsibility of the patient to secure confidentiality on his end  of the session. The provider was in a private home office for the duration of this session.   The patient arrived on time for her Caregility session  Time Spent: 1:05 PM until 2 PM, 55 minutes Treatment Type: Individual Therapy Reported Symptoms: anxiety, panic attacks The patient said he came in from his walk this morning  and saw what he thought was some sort of tic from his wife.  It was not the first time it happened and he wonders if it might be tardive dyskinesia because of some new medications that she is on.  She has a call into the doctor right now and hopes to hear something back soon.  They have narrowed down to the fact they think most of her discomfort in breathing is coming from the diaphragm and they are looking for alternatives for treatmen Stephen Myers.  The patient continues to do those things which help him cope including walking, fishing when he can, spending time with his son and daughter-in-law and grandson.  He also has a way of continued coping with grief he is writing letters to his daughters as a way of expressing his feelings and says that is very beneficial.  He appears to be grieving in a healthy way but he knows that he to year anniversary of her death is coming up an d there is some grief there but says it is not overwhelming.  We will continue to work on coping skills for helping with his anxiety and the progression of his Lewy body dementia diagnosis as well as processing his grief. Mental Status Exam: Appearance:  Well Groomed  Behavior: Appropriate Motor: Pt. Reports some instability due to Lewey Body dementia Speech/Language:  Normal Rate Affect: Appropriate Mood: normal Thought pro cess: normal Thought content:  WNL Sensory/Perceptual disturbances:  WNL Orientation: oriented to person, place, and situation Attention: Good Concentration: Good Memory: Impaired short term Fund of knowledge:  Good Insight:  Good Judgment:  Good Impulse Control: Good  Risk  Assessment: Danger to Self: No Self-injurious Behavior: No Danger to Others: No Duty to Warn:no Physical Aggression / Violence:No  Access to Fi rearms a concern: No  Gang Involvement:No  Treatment plan: Will employ cognitive behavioral therapy as well as grief and loss therapy for relief of anxiety and grief.  Goals of grief therapy or to have a healthier response to the loss of his daughter and less sadness as evidenced by patient report in therapy notes as well as to help him feel less overwhelmed by her loss.  Goals are to see a 50% reduction in grief symptoms over the nex Stephen Myers 6 months.  I will encourage the use of the patient telling his grief story about his daughter including encouraging him to bring pictures and memorabilia to help process his feelings including any feelings of guilt associated with her loss.  We will use cognitive behavioral therapy to help identify and change anxiety producing thoughts and behavior patterns so as to improve his ability to better manage anxiety and stress, and manage Stephen Myers houghts and worrisome thinking contributing to anxiety.  We will also refresh and encourage dialectical behavior therapy distress tolerance  and mindfulness skills with the intention of reducing anxiety by 50% over the next 6 months. Progress: 30% Interventions: Cognitive Behavioral Therapy  Diagnosis:PTSD, Bi-Polar D/O  Plan: F/U/ appointments scheduled weekly.  Cecile Coder, [facility]                                                                                                                    Cecile Coder, [facility]  [place] [facility] Counselor/Therapist Progress Note  Patient ID: Markeese Boyajian, MRN: [id number],    Date: 04/01/2024 This session was held via video teletherapy. The patient consented to the video teletherapy and was located in his home during this session. He is aware it is the responsibility of the patient to secure confidential ity on his end of the session. The provider was in a private home office for the duration of this session.   The patient arrived on time for her Caregility session  Time Spent: 59 minutes, 2 PM to 2:59 PM  Treatment Type: Individual Therapy Reported Symptoms: anxiety, panic attacks The patient continues to present with physical pain and both his back and his knee.  He started with a new physical therapist and told him that there  was a lot of arthritis in both knees and or certain things he cannot do but they had him do that anyway and now for the past 3 days he has been in significant knee pain.  He has  continued to walk because he knows that is good for him in various ways but says it has been painful.  His wife also was not feeling well but she is going back to the doctor next week to have an ablation and hopes that will bring her some relief.  For the most  part he has been at the last couple weeks reaching back out and hearing from some old friends as well as time with his son and son's family.  He says that his wife's cousin is coming down for a weekend soon and they enjoy time with her and that he will go to his brother's house sometime in the next few weeks.  He recognizes the need for people that he cares about in helping with his depression. We will continue to work on coping skills  for helping with his anxiety and the progression of his Lewy body dementia diagnosis as well as processing his grief. Mental Status Exam: Appearance:  Well Groomed  Behavior: Appropriate Motor: Pt. Reports some instability due to Lewey Body dementia Speech/Language:  Normal Rate Affect: Appropriate Mood: normal Thought process: normal Thought content:  WNL Sensory/Perceptual disturbances:  WNL Orientation: oriented to pe rson, place, and situation Attention: Good Concentration: Good Memory: Impaired short term Fund of knowledge:  Good Insight:  Good Judgment:  Good Impulse Control: Good  Risk Assessment: Danger to Self: No Self-injurious Behavior: No Danger to Others: No Duty to Warn:no Physical Aggression / Violence:No  Access to Firearms a concern: No  Gang Involvement:No  Treatment plan: Will employ cognitive behavioral therapy as  well as grief and loss therapy for relief of anxiety and grief.  Goals of grief therapy or to have a healthier response to the loss of his daughter and less sadness as evidenced by patient report in therapy notes as well as to help him feel less overwhelmed by her loss.  Goals are to see a 50% reduction in grief symptoms over the next 6 months.  I will encourage  the use of the patient telling his grief story about his daughter including  encouraging him to bring pictures and memorabilia to help process his feelings including any feelings of guilt associated with her loss.  We will use cognitive behavioral therapy to help identify and change anxiety producing thoughts and behavior patterns so as to improve his ability to better manage anxiety and stress, and manage thoughts and worrisome thinking contributing to anxiety.  We will also  refresh and encourage dialectical b ehavior therapy distress tolerance and mindfulness skills with the intention of reducing anxiety by 50% over the next 6 months. Progress: 30% Interventions: Cognitive Behavioral Therapy  Diagnosis:PTSD, Bi-Polar D/O  Plan: F/U/ appointments scheduled weekly.  Cecile Coder, [facility]                                                                                                                    Cecile Coder, [facility]               Cecile Coder, [facility]               Cecile Coder, [facility]               Cecile Coder, [facility]  [place] [facility] Counselor/Therapist Progress Note  Patient ID: Kailash Hinze, MRN: [id number],    Date: 04/01/2024 This session was held via video teletherapy. The patient consented to the video teletherapy and was located in his home during this session. He is aware it is the responsibility  of the patient to secure confidentiality on his end of the session. The provider was in a private home office for the duration of this session.   The patient arrived on time for her Caregility session  Time Spent: 57 minutes, 2 PM to 2:57 PM  Treatment Type: Individual Therapy Reported Symptoms: anxiety, panic attacks The patient is still having significant back pain.  He cannot sit anywhere for very long and has to be careful  where he sits.  There is still some unsteadiness although he did not report any falls over the past 2 weeks.  His biggest concern now is for his wife.  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  She is meeting with her cardiologist again next week to see what possible treatment is for her.  He still thinks there may be some issue with her diaphragm but she  is meeting with the pulmonologist also.  He is doing what he can to help her.  His daughter's birthday is March 11 so he knows that is a difficult time for both he and his wife next  week.  Typically they do something to remember her but because of the wife's current health issues they cannot go to [place] as they had planned so they are talking about what to do to remember and celebrate her birthday.  We will continue to work  on coping skills for helping with his anxiety and the progression of his Lewy body dementia diagnosis as well as processing his grief. Mental Status Exam: Appearance:  Well Groomed  Behavior: Appropriate Motor: Pt. Reports some instability due to Lewey Body dementia Speech/Language:  Normal Rate Affect: Appropriate Mood: normal Thought process: normal Thought content:  WNL Sensory/Perceptual disturbances:  WNL Orientatio n: oriented to person, place, and situation Attention: Good Concentration: Good Memory: Impaired short term Fund of knowledge:  Good Insight:  Good Judgment:  Good Impulse Control: Good  Risk Assessment: Danger to Self: No Self-injurious Behavior: No Danger to Others: No Duty to Warn:no Physical Aggression / Violence:No  Access to Firearms a concern: No  Gang Involvement:No  Treatment plan: Will employ cognitive behav ioral therapy as well as grief and loss therapy for relief of anxiety and grief.  Goals of grief therapy or to have a healthier response to the loss of his daughter and less sadness as evidenced by patient report in therapy notes as well as to help him feel less overwhelmed by her loss.  Goals are to see a 50% reduction in grief symptoms over the next 6 months.  I will encourage the use of the patient telling his grief story about his d aughter including encouraging him to bring pictures and memorabilia to help process his feelings including any feelings of guilt associated with her loss.  We will use cognitive behavioral therapy to help identify and change anxiety producing thoughts and behavior patterns so as to improve his ability to better manage anxiety and stress, and manage thoughts and  worrisome thinking contributing to anxiety.  We will also refresh and encour age dialectical behavior therapy distress tolerance and mindfulness  skills with the intention of reducing anxiety by 50% over the next 6 months. Progress: 30% Interventions: Cognitive Behavioral Therapy  Diagnosis:PTSD, Bi-Polar D/O  Plan: F/U/ appointments scheduled weekly.  Cecile Coder, [facility]                                                                                                                    Cecile Coder, [facility]  [place] [facility] Counselor/Therapist Progress Note  Patient ID: Yoniel Arkwright, MRN:  [id number],    Date: 04/01/2024 This session was held via video teletherapy. The patient consented to the video teletherapy and was located in his home during this session. He is aware it is the responsibility of the patient to secure confidentiality on his end of the session. The provider was in a private home office for the duratio n of this session.   The patient arrived on time for her Caregility session  Time Spent: 58 minutes, 2 PM to 2:58 PM  Treatment Type: Individual Therapy Reported Symptoms: anxiety, panic attacks A fairly difficult week because it was his daughter's birthday.  He said this year was more difficult than last year but he could not pinpoint why.  They did remember her about going out to a nice restaurant which is something her daught er always wanted to do.  He said they were able to laugh and joke about funny things that his daughter had said or done which is not something they were able to do this time last year so he saw that as healthy grieving.  He knows that the time change threw things off a little bit for everybody and his wife still is not feeling well and his back has been bothering him.  He was not able to go to his first round of physical therapy this we ek because his back was bothering him more.  He has been thinking a lot more about his daughter which he feels comfortable with.  He is thankful that it is getting warmer and staying light longer so he can get back to some of the coping skills such as fishing that he has done in the past.   We will continue to work on coping skills for helping with his anxiety and the progression of his Lewy body dementia diagnosis as well as process ing his grief. Mental Status Exam: Appearance:  Well Groomed  Behavior: Appropriate Motor: Pt. Reports some instability due to Lewey Body dementia Speech/Language:  Normal Rate Affect: Appropriate Mood: normal Thought process: normal Thought content:  WNL Sensory/Perceptual  disturbances:  WNL Orientation: oriented to person, place, and situation Attention: Good Concentration: Good Memory: Impaired short term Fund of k nowledge:  Good Insight:  Good Judgment:  Good Impulse Control: Good  Risk Assessment: Danger to Self: No Self-injurious Behavior: No Danger to Others: No Duty to Warn:no Physical Aggression / Violence:No  Access to Firearms a concern: No  Gang Involvement:No  Treatment plan: Will employ cognitive behavioral therapy as well as grief and loss therapy for relief of anxiety and grief.  Goals of grief therapy or to have a hea lthier response to the loss of his daughter and less sadness as evidenced by patient report in therapy notes as well as to help him feel less overwhelmed by her loss.  Goals are to see a 50% reduction in grief symptoms over the next 6 months.  I will encourage the use of the patient telling his grief story about his daughter including encouraging him to bring pictures and memorabilia to help process his feelings including any feelings o f guilt associated with her loss.  We will use cognitive behavioral therapy to help identify and change anxiety producing thoughts and behavior patterns so as to improve his ability to better manage anxiety and stress, and manage thoughts and worrisome thinking contributing to anxiety.  We will also refresh and encourage dialectical behavior therapy distress  tolerance and mindfulness skills with the intention of reducing anxiety by 50%  over the next 6 months. Progress: 30% Interventions: Cognitive Behavioral Therapy  Diagnosis:PTSD, Bi-Polar D/O  Plan: F/U/ appointments scheduled weekly.  Cecile Coder, [facility]                                                                                                                   Cecile Coder, [facility]                Cecile Coder, [facility]               Cecile Coder, [facility]  [place] [facility] Counselor/Therapist Progress Note  Patient ID: Syd Newsome, MRN: [id number],    Date: 04/01/2024 This session was held via video teletherapy. The patient consented to the video teletherapy and was located in his home during this session. He is aware it is the responsibility of the patient to secure confidentiality on his end of the session. The provider was in a private home office for the duration of this session.   The p atient arrived on time for her Caregility session  Time Spent: 57 minutes, 2 PM to 2:57 PM  Treatment Type: Individual Therapy Reported Symptoms: anxiety, panic attacks The patient is still having significant  back pain.  He cannot sit anywhere for very long and has to be careful where he sits.  There is still some unsteadiness although he did not report any falls over the past 2 weeks.  His biggest concern now is for his wife.  She  has been having some difficulty breathing and they did a test so they found a small hole on the back side of her heart.  She is meeting with her cardiologist again next week to see what possible treatment is for her.  He still thinks there may be some issue with her diaphragm but she is meeting with the pulmonologist also.  He is doing what he can to help her.  His daughter's birthday is March 11 so he knows that is a difficult time  for both he and his wife next week.  Typically they do something to remember her but because of the wife's current health issues they cannot go to [place] as they had planned so they are talking about what to do to remember and celebrate her birthday.  We will continue to work on coping skills for helping with his anxiety and the progression of his Lewy body dementia diagnosis as well as processing his grief. Mental Status Exam : Appearance:  Well Groomed  Behavior: Appropriate Motor: Pt. Reports some instability due to Lewey Body dementia Speech/Language:  Normal Rate Affect: Appropriate Mood: normal Thought process: normal Thought content:  WNL Sensory/Perceptual disturbances:  WNL Orientation: oriented to person, place, and situation Attention: Good Concentration: Good Memory: Impaired short term Fund of knowledge:  Good Insight:  Good  Judgment:  Good Impulse Control: Good  Risk Assessment: Danger to Self: No Self-injurious Behavior: No Danger to Others: No Duty to Warn:no Physical Aggression / Violence:No  Access to Firearms a concern: No  Gang Involvement:No  Treatment plan: Will employ cognitive behavioral therapy as well as grief and loss therapy for relief of anxiety and grief.  Goals of grief therapy or to  have a healthier response to the loss of his  daughter and less sadness as evidenced by patient report in therapy notes as well as to help him feel less overwhelmed by her loss.  Goals are to see a 50% reduction in grief symptoms over the next 6 months.  I will encourage the use of the patient telling his grief story about his daughter including encouraging him to bring pictures and memorabilia to help process his feelings including any feelings of guilt associated with her loss.   We will use cognitive behavioral therapy to help identify and change anxiety producing thoughts and behavior patterns so as to improve his ability to better manage anxiety and stress, and manage thoughts and worrisome thinking contributing to anxiety.  We will also refresh and encourage dialectical behavior therapy distress tolerance and mindfulness  skills with the intention of reducing anxiety by 50% over the next 6 months. Progress:  30% Interventions: Cognitive Behavioral Therapy  Diagnosis:PTSD, Bi-Polar D/O  Plan: F/U/ appointments scheduled weekly.  Cecile Coder, [facility]                                                                                                                   Cecile Coder, [facility]  [place] [facility] Counselor/Therapist Progress Note  Patient ID: Sahib Pella, MRN: [id number],    Date: 04/01/2024 This session was h eld via video teletherapy. The patient consented to the video teletherapy and was located in his home during this session. He is aware it is the responsibility of the patient to secure confidentiality on his end of the session. The provider was in a private home office for the duration of this session.   The patient arrived on time for her Caregility session  Time Spent: 57 minutes, 2 PM to 2:57 PM  Treatment Type: Individual Ther apy Reported Symptoms: anxiety, panic attacks  We will continue to work on coping skills for helping with his anxiety and the progression of his Lewy body dementia diagnosis as well as processing his grief. Mental Status Exam: Appearance:  Well Groomed  Behavior: Appropriate Motor: Pt. Reports some instability due to Lewey Body dementia Speech/Language:  Normal Rate Affect: Appropriate Mood: normal Thought process: normal  Thought content:  WNL Sensory/Perceptual disturbances:  WNL Orientation: oriented to person, place, and situation Attention: Good Concentration: Good Memory: Impaired short  term Fund of knowledge:  Good Insight:  Good Judgment:  Good Impulse Control: Good  Risk Assessment: Danger to Self: No Self-injurious Behavior: No Danger to Others: No Duty to Warn:no Physical Aggression / Violence:No  Access to Firearms a conce rn: No  Gang Involvement:No  Treatment plan: Will employ cognitive behavioral therapy as well as grief and loss therapy for relief of anxiety and grief.  Goals of grief therapy or to have a healthier response to the loss of his daughter and less sadness as evidenced by patient report in therapy notes as well as to help him feel less overwhelmed by her loss.  Goals are to see a 50% reduction in grief symptoms over the next 6 months.  I  will encourage the use of the patient telling his grief story about his daughter including encouraging him to bring pictures and memorabilia to help process his feelings including any feelings of guilt associated with her loss.  We will use cognitive behavioral therapy to help identify and change anxiety producing thoughts and behavior patterns so as to improve his ability to better manage anxiety and stress, and manage thoughts and wo rrisome thinking contributing to anxiety.  We will also refresh and encourage dialectical behavior therapy distress tolerance and mindfulness skills with the intention of reducing anxiety by 50% over the next 6 months. Progress: 30% Interventions: Cognitive Behavioral Therapy  Diagnosis:PTSD, Bi-Polar D/O  Plan: F/U/ appointments scheduled weekly.  Cecile Coder, [facility]                                                                                                                    Cecile Coder, [facility]               Cecile Coder, [facility]               Cecile Coder, [facility]  Cecile Coder,  [facility]               Cecile Coder, [facility]               Cecile Coder,  [facility]               Cecile Coder, [facility]  [facility] Counselor/Therapist Progress Note  Pa tient ID: Hoyle Barkdull, MRN: [id number],    Date: 04/01/2024 This session was held via video teletherapy. The patient consented to the video teletherapy and was located in his home during this session. He is aware it is the responsibility of the patient to secure confidentiality on his end of the session. The provider was in a private home office for the duration of this session.   The patient arrived on time for her Caregility  session  Time Spent: 39 minutes, 2 PM to 2:39 PM  Treatment Type: Individual  Therapy Reported Symptoms: anxiety, panic attacks The patient was not feeling well today.  He started feeling really earlier in the week having a fever bad headaches body aches.  He felt a little better yesterday but feels a little bit of a resurgence in headaches and body aches and is running a low-grade fever.  His wife is feeling some of the same sympt oms.  For the most part he says he is doing well.  They did increase his memory medication because they were starting to see some increasing decline in short-term memory and he said until recently his long-term memory had been good but he was starting to see a difference in how he used to markers to remember what happened at certain times historically.  He feels the medication has helped and has brightened his mood a little also.  He is  still trying to walk every day and fish when he can and has enjoyed watching the [employer] as a good distraction.  He reports that he feels he is managing mood fairly well.  He has an appointment in 2 weeks.  He does contract for safety having no thoughts of hurting himself or anyone else. We will continue to work on coping skills for helping with his anxiety and the progression of his Lewy body dementia diagnosis as w ell as processing his grief. Mental Status Exam: Appearance:  Well Groomed  Behavior: Appropriate Motor: Pt. Reports some instability due to Lewey Body dementia Speech/Language:  Normal Rate Affect: Appropriate Mood: normal Thought process: normal Thought content:  WNL Sensory/Perceptual disturbances:  WNL Orientation: oriented to person, place, and situation Attention: Good Concentration: Good Memory: Impaired short Stephen Myers erm Fund of knowledge:  Good Insight:  Good Judgment:  Good Impulse Control: Good  Risk Assessment: Danger to Self: No Self-injurious Behavior: No Danger to Others: No Duty to Warn:no Physical Aggression / Violence:No  Access to Firearms a concern: No   Gang Involvement:No  Treatment plan: Will employ cognitive behavioral therapy as well as grief and loss therapy for relief of anxiety and grief.  Goals of grief therapy or  to have a healthier response to the loss of his daughter and less sadness as evidenced by patient report in therapy notes as well as to help him feel less overwhelmed by her loss.  Goals are to see a 50% reduction in grief symptoms over the next 6 months.  I will encourage the use of the patient telling his grief story about his daughter including encouraging him to bring pictures and memorabilia to help process his feelings including  any feelings of guilt associated with her loss.  We will use cognitive behavioral therapy to help identify and change anxiety producing thoughts and behavior patterns so as to improve his ability to better manage anxiety and stress, and manage thoughts and worrisome thinking contributing to anxiety.  We will also refresh  and encourage dialectical behavior therapy distress tolerance and mindfulness skills with the intention of reducing a nxiety by 50% over the next 6 months. Progress: 30% Interventions: Cognitive Behavioral Therapy  Diagnosis:PTSD, Bi-Polar D/O  Plan: F/U/ appointments scheduled weekly.  Cecile Coder, [facility]                                                                                                                   [address] [place], [facility]  [facility] Counselor/Therapist Progress Note  Patient ID: Nazario Russom, MRN: [id number],    Date: 04/01/2024 This session was held via video teletherapy. The patient consented to the video teletherapy and was located in his home during this session. He is aware it is the responsibility of the patient to secure confidentiality on his end of the session. The provider was in a private home office for the duration of this session.   The patient arrived on time for her Caregility session  Time Spent: 58 minutes, 2  PM to 2:58 PM  Treatment Type: Individual Therapy Reported Symptoms: anxiety, panic attacks A fairly difficult week because it was his daughter's birthday.  He said this year was more difficult than last year but he could not pinpoint why.  They did remember her about going out to a nice restaurant which is something her daughter always wanted to do.  He said they were able to laugh and joke about funny things that his daughter had  said or done which is not something they were able to do this time last year so he saw that as healthy grieving.  He knows that the time change threw things off a little bit for  everybody and his wife still is not feeling well and his back has been bothering him.  He was not able to go to his first round of physical therapy this week because his back was bothering him more.  He has been thinking a lot more about his daughter which he fe els comfortable with.  He is thankful that it is getting warmer and staying light longer so he can get back to some of the coping skills such as fishing that he has done in the past.   We will continue to work on coping skills for helping with his anxiety and the progression of his Lewy body dementia diagnosis as well as processing his grief. Mental Status Exam: Appearance:  Well Groomed  Behavior: Appropriate Motor: Pt. Report s some instability due to Lewey Body dementia Speech/Language:  Normal Rate Affect: Appropriate Mood: normal Thought process: normal Thought content:  WNL Sensory/Perceptual disturbances:  WNL Orientation: oriented to person, place, and situation Attention: Good Concentration: Good Memory: Impaired short term Fund of knowledge:  Good Insight:  Good Judgment:  Good Impulse Control: Good  Risk Assessment: Danger to Sel f: No Self-injurious Behavior: No Danger to Others: No Duty to Warn:no Physical Aggression / Violence:No  Access to Firearms a concern: No  Gang Involvement:No  Treatment plan: Will employ cognitive behavioral therapy as well as grief and loss therapy for relief of anxiety and grief.  Goals of grief therapy or to have a healthier response to the loss of his daughter and less sadness as evidenced by patient report in therapy note s as well as to help him feel less overwhelmed by her loss.  Goals are to see a 50% reduction in grief symptoms over the next 6 months.  I will encourage the use of the patient telling his grief story about his daughter including encouraging him to bring pictures and memorabilia to help process his feelings including any feelings of guilt associated with her  loss.  We will use cognitive behavioral therapy to help identify and change anx iety producing thoughts and behavior patterns so as to improve his ability to better manage anxiety and stress, and manage thoughts and worrisome thinking contributing to anxiety.  We will also refresh and encourage dialectical behavior therapy  distress tolerance and mindfulness skills with the intention of reducing anxiety by 50% over the next 6 months. Progress: 30% Interventions: Cognitive Behavioral Therapy  Diagnosis:PTSD, Bi-P olar D/O  Plan: F/U/ appointments scheduled weekly.  Cecile Coder, [facility]                                                                                                                   Cecile Coder, [facility]               Cecile Coder, [facility]               Cecile Coder, [facility]  [facility] Counselor/Therapist Progress Note  Patient ID: Kele Barthelemy, MRN: [id number],    Date: 04/01/2024 This sessio n was held via video teletherapy. The patient consented to the video teletherapy and was located in his home during this session. He is aware it is the responsibility of the patient to secure confidentiality on his end of the session. The provider was in a private home office for the duration of this session.   The patient arrived on time for her Caregility session  Time Spent: 1:05 PM until 2 PM, 55 minutes Treatment Type: Indivi dual Therapy Reported Symptoms: anxiety, panic attacks The patient said he came in from his walk this morning and saw what he thought was some sort of tic from his wife.  It was not the first time it happened and he wonders if it might be tardive dyskinesia because of some new medications that she is on.  She has a call into the doctor right now and hopes to hear something back soon.  They have narrowed down to the fact they think mos Stephen Myers of her discomfort in breathing is coming from the diaphragm and they are looking for alternatives for treatment.  The patient continues to do those things which help him cope including walking, fishing when he can, spending time with his son and daughter-in-law and grandson.  He also has a way of continued coping with grief he is writing letters to his daughters as a way of expressing his feelings and says that is very beneficial.   He appears to be grieving in a healthy way but he knows that he to year anniversary of her death is coming up and there is some grief there but says it is not overwhelming.  We will  continue to work on coping skills for helping with his anxiety and the progression of his Lewy body dementia diagnosis as well as processing his grief. Mental Status Exam: Appearance:  Well Groomed  Behavior: Appropriate Motor: Pt. Reports some insta bility due to Lewey Body dementia Speech/Language:  Normal Rate Affect: Appropriate Mood: normal Thought process: normal Thought content:  WNL Sensory/Perceptual disturbances:  WNL Orientation: oriented to person, place, and situation Attention: Good Concentration: Good Memory: Impaired short term Fund of knowledge:  Good Insight:  Good Judgment:  Good Impulse Control: Good  Risk Assessment: Danger to Self: No Self -injurious Behavior: No Danger to Others: No Duty to Warn:no Physical Aggression / Violence:No  Access to Firearms a concern: No  Gang Involvement:No  Treatment plan: Will employ cognitive behavioral therapy as well as grief and loss therapy for relief of anxiety and grief.  Goals of grief therapy or to have a healthier response to the loss of his daughter and less sadness as evidenced by patient report in therapy notes as well as  to help him feel less overwhelmed by her loss.  Goals are to see a 50% reduction in grief symptoms over the next 6 months.  I will encourage the use of the patient telling his grief story about his daughter including encouraging him to bring pictures and memorabilia to help process his feelings including any feelings of guilt associated with her loss.  We will use cognitive behavioral therapy to help identify and change anxiety produci ng thoughts and behavior patterns so as to improve his ability to better manage anxiety and stress, and manage thoughts and worrisome thinking contributing to anxiety.  We will also refresh and encourage dialectical behavior therapy distress tolerance and  mindfulness skills with the intention of reducing anxiety by 50% over the next 6 months. Progress:  30% Interventions: Cognitive Behavioral Therapy  Diagnosis:PTSD, Bi-Polar D/O   Plan: F/U/ appointments scheduled weekly.  Cecile Coder, [facility]                                                                                                                   Cecile Coder, [facility]  [facility] Counselor/Therapist Progress Note  Patient ID: Zaylyn Bergdoll, MRN: [id number],    Date: 04/01/2024 This session was held via video teletherapy. The patient consented to the video teletherapy and was loc ated in his home during this session. He is aware it is the responsibility of the patient to secure  confidentiality on his end of the session. The provider was in a private home office for the duration of this session.   The patient arrived on time for her Caregility session  Time Spent: 59 minutes, 2 PM to 2:59 PM  Treatment Type: Individual Therapy Reported Symptoms: anxiety, panic attacks The patient continues to present with  physical pain and both his back and his knee.  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  He has continued to walk because he knows that is good for him in various ways but says it has been painful.  His wife also was not feeling well but she is  going back to the doctor next week to have an ablation and hopes that will bring her some relief.  For the most part he has been at the last couple weeks reaching back out and hearing from some old friends as well as time with his son and son's family.  He says that his wife's cousin is coming down for a weekend soon and they enjoy time with her and that he will go to his brother's house sometime in the next few weeks.  He recognizes th e need for people that he cares about in helping with his depression. We will continue to work on coping skills for helping with his anxiety and the progression of his Lewy body dementia diagnosis as well as processing his grief. Mental Status Exam: Appearance:  Well Groomed  Behavior: Appropriate Motor: Pt. Reports some instability due to Lewey Body dementia Speech/Language:  Normal Rate Affect: Appropriate Mood: normal Thou ght process: normal Thought content:  WNL Sensory/Perceptual disturbances:  WNL Orientation: oriented to person, place, and situation Attention: Good Concentration: Good Memory: Impaired short term Fund of knowledge:  Good Insight:  Good Judgment:  Good Impulse  Control: Good  Risk Assessment: Danger to Self: No Self-injurious Behavior: No Danger to Others: No Duty to Warn:no Physical Aggression / Violence:No  Acces s to Firearms a concern: No  Gang Involvement:No  Treatment plan: Will employ cognitive behavioral therapy as well as grief and loss therapy for relief of anxiety and grief.  Goals of grief therapy or to have a healthier response to the loss of his daughter and less sadness as evidenced by patient report in therapy notes as well as to help him feel less overwhelmed by her loss.  Goals are to see a 50% reduction in grief symptoms over  the next 6 months.  I will encourage the use of the patient telling his grief story about his daughter including encouraging him to bring pictures and memorabilia to help process his feelings including any feelings of guilt associated with her loss.  We will use cognitive behavioral therapy to help identify and change anxiety producing thoughts and behavior patterns so as to improve his ability to better manage anxiety and stress, and m anage thoughts and worrisome thinking contributing to anxiety.  We will  also refresh and encourage dialectical behavior therapy distress tolerance and mindfulness skills with the intention of reducing anxiety by 50% over the next 6 months. Progress: 30% Interventions: Cognitive Behavioral Therapy  Diagnosis:PTSD, Bi-Polar D/O  Plan: F/U/ appointments scheduled weekly.  Cecile Coder, [facility]                                                                                                                    Cecile Coder, [facility]               Cecile Coder, [facility]               Cecile Coder, [facility]               Cecile Coder, [facility]  Cecile Coder, [facility]  [place] [facility] Counselor/Therapist Progress Note  Patient ID: Izel Eisenhardt, MRN: [id number] ,    Date: 04/01/2024 This session was held via video teletherapy. The patient consented to the video teletherapy and was located in his home during this session. He is aware it is the responsibility of the patient to secure confidentiality on his end of the session. The provider was in a private home office for the duration of this session.   The patient arrived on time for her Caregility session  Time Spent: 58 minutes, 2 PM to  2:58 PM  Treatment Type: Individual Therapy Reported Symptoms: anxiety, panic attacks A fairly difficult week because it was his daughter's birthday.  He said this year was more difficult than last year but he could not pinpoint why.  They did remember her about going out to a nice restaurant which is something her daughter always wanted to do.  He said they were able to laugh and joke about funny things that his daughter had said o r done which is not something they were able to do this time last year so he saw that as healthy grieving.  He knows that the time change threw things off a little bit for everybody and  his wife still is not feeling well and his back has been bothering him.  He was not able to go to his first round of physical therapy this week because his back was bothering him more.  He has been thinking a lot more about his daughter which he feels co mfortable with.  He is thankful that it is getting warmer and staying light longer so he can get back to some of the coping skills such as fishing that he has done in the past.   We will continue to work on coping skills for helping with his anxiety and the progression of his Lewy body dementia diagnosis as well as processing his grief. Mental Status Exam: Appearance:  Well Groomed  Behavior: Appropriate Motor: Pt. Reports some  instability due to Lewey Body dementia Speech/Language:  Normal Rate Affect: Appropriate Mood: normal Thought process: normal Thought content:  WNL Sensory/Perceptual disturbances:  WNL Orientation: oriented to person, place, and situation Attention: Good Concentration: Good Memory: Impaired short term Fund of knowledge:  Good Insight:  Good Judgment:  Good Impulse Control: Good  Risk Assessment: Danger to Self: No  Self-injurious Behavior: No Danger to Others: No Duty to Warn:no Physical Aggression / Violence:No  Access to Firearms a concern: No  Gang Involvement:No  Treatment plan: Will employ cognitive behavioral therapy as well as grief and loss therapy for relief of anxiety and grief.  Goals of grief therapy or to have a healthier response to the loss of his daughter and less sadness as evidenced by patient report in therapy notes as w ell as to help him feel less overwhelmed by her loss.  Goals are to see a 50% reduction in grief symptoms over the next 6 months.  I will encourage the use of the patient telling his grief story about his daughter including encouraging him to bring pictures and memorabilia to help process his feelings including any feelings of guilt associated with her loss.  We will  use cognitive behavioral therapy to help identify and change anxiety p roducing thoughts and behavior patterns so as to improve his ability to better manage anxiety and stress, and manage thoughts and worrisome thinking contributing to anxiety.  We will also refresh and encourage dialectical behavior therapy  distress tolerance and mindfulness skills with the intention of reducing anxiety by 50% over the next 6 months. Progress: 30% Interventions: Cognitive Behavioral Therapy  Diagnosis:PTSD, Bi-Polar D /O  Plan: F/U/ appointments scheduled weekly.  Cecile Coder, [facility]                                                                                                                   Cecile Coder, [facility]               Cecile Coder, [facility]               Cecile Coder, [facility]  [facility] Counselor/Therapist Progress Note  Patient ID: Xzaiver Vayda, MRN: [id number],    Date: 04/01/2024 This  session was held via video teletherapy. The patient consented to the video teletherapy and was located in his home during this session. He is aware it is the responsibility of the patient to secure confidentiality on his end of the session. The provider was in a private home office for the duration of this session.   The patient arrived on time for her Caregility session  Time Spent: 57 minutes, 2 PM to 2:57 PM  Treatment Type: In dividual Therapy Reported Symptoms: anxiety, panic attacks The patient is still having significant back pain.  He cannot sit anywhere for very long and has to be careful where he sits.  There is still some unsteadiness although he did not report any falls over the past 2 weeks.  His biggest concern now is for his wife.  She has been having some difficulty breathing and they did a test so they found a small hole on the back side of her  heart.  She is meeting with her cardiologist again next week to see what possible treatment is for her.  He still thinks there may be some issue with her diaphragm but she is meeting with the pulmonologist also.  He is doing what he can to help her.  His daughter's birthday is March 11 so he knows that is a difficult time for both he and his wife next week.  Typically they do something to remember her but because of the wife's curren Stephen Myers health issues they cannot go to [place] as they had planned so they are talking about what to do to remember and celebrate her birthday.  We will continue to work on coping  skills for helping with his anxiety and the progression of his Lewy body dementia diagnosis as well as processing his grief. Mental Status Exam: Appearance:  Well Groomed  Behavior: Appropriate Motor: Pt. Reports some instability due to Lewey Body dem entia Speech/Language:  Normal Rate Affect: Appropriate Mood: normal Thought process: normal Thought content:  WNL Sensory/Perceptual disturbances:  WNL Orientation: oriented to person, place, and situation Attention: Good Concentration: Good Memory: Impaired short term Fund of knowledge:  Good Insight:  Good Judgment:  Good Impulse Control: Good  Risk Assessment: Danger to Self: No Self-injurious Behavior: No Dan ger to Others: No Duty to Warn:no Physical Aggression / Violence:No  Access to Firearms a concern: No  Gang Involvement:No  Treatment plan: Will employ cognitive behavioral therapy as well as grief and loss therapy for relief of anxiety and grief.  Goals of grief therapy or to have a healthier response to the loss of his daughter and less sadness as evidenced by patient report in therapy notes as well as to help him feel less overw helmed by her loss.  Goals are to see a 50% reduction in grief symptoms over the next 6 months.  I will encourage the use of the patient telling his grief story about his daughter including encouraging him to bring pictures and memorabilia to help process his feelings including any feelings of guilt associated with her loss.  We will use cognitive behavioral therapy to help identify and change anxiety producing thoughts and behavior pat terns so as to improve his ability to better manage anxiety and stress, and manage thoughts and worrisome thinking contributing to anxiety.  We will also refresh and encourage dialectical behavior therapy distress tolerance and mindfulness skills  with the intention of reducing anxiety by 50% over the next 6 months. Progress: 30% Interventions: Cognitive Behavioral  Therapy  Diagnosis:PTSD, Bi-Polar D/O  Plan: F/U/ appointments sche duled weekly.  Cecile Coder, [facility]                                                                                                                   Cecile Coder, [facility]  [facility] Counselor/Therapist Progress Note  Patient ID: Casson Catena, MRN: [id number],    Date: 04/01/2024 This session was held via video teletherapy. The patient consented to the video teletherapy and was located in his home during this session. He  is aware it is the responsibility of the patient to secure confidentiality on his end of the session. The  provider was in a private home office for the duration of this session.   The patient arrived on time for her Caregility session  Time Spent: 57 minutes, 2 PM to 2:57 PM  Treatment Type: Individual Therapy Reported Symptoms: anxiety, panic attacks  The patient's wife continues to heal and improve.  For the most part physic ally he has done fairly well this week.  The hardest part has been that thy had tp put their  had to put their dog to sleep. Aaron AasHe feels that he is coping well but indicated that it did stir up some feelings  He had been in failing health for a while and was about 63 years old. It did bring sadness to him.  For the most part it has been a good week. He is still walking. He is writing in his journal about his daughter. Hi anxiety in mana geable and depression mild.  He does contract for safety. Mental Status Exam: Appearance:  Well Groomed  Behavior: Appropriate Motor: Pt. Reports some instability due to Lewey Body dementia Speech/Language:  Normal Rate Affect: Appropriate Mood: normal Thought process: normal Thought content:  WNL Sensory/Perceptual disturbances:  WNL Orientation: oriented to person, place, and situation Attention: Good Concentration:  Good Memory: Impaired short term Fund of knowledge:  Good Insight:  Good Judgment:  Good Impulse Control: Good  Risk Assessment: Danger to Self: No Self-injurious Behavior: No Danger to Others: No Duty to Warn:no Physical Aggression / Violence:No  Access to Firearms a concern: No  Gang Involvement:No  Treatment plan: Will employ cognitive behavioral therapy as well as grief and loss therapy for relief of anxiety and gr ief.  Goals of grief therapy or to have a healthier response to the loss of his daughter and less sadness as evidenced by patient report in therapy notes as well as to help him feel less overwhelmed by her loss.  Goals are to see a 50% reduction in grief symptoms over the next 6 months.  I  will encourage the use of the patient telling his grief story about his daughter including encouraging him to bring pictures and memorabilia to help  process his feelings including any feelings of guilt associated with her loss.  We will use cognitive behavioral therapy to help identify and change anxiety producing thoughts and behavior patterns so as to improve his ability to better manage anxiety and stress, and manage thoughts and worrisome thinking contributing to anxiety.  We will also refresh and encourage dialectical behavior therapy distress tolerance and mindfulness skills w ith the intention of reducing anxiety by 50% over the next 6 months. Progress: 35% Reviewed target goals with a new target date of October 31st, 2025. Interventions: Cognitive Behavioral Therapy  Diagnosis:PTSD, Bi-Polar D/O  Plan: F/U/ appointments scheduled weekly.  Cecile Coder, [facility]  Cecile Coder, [facility]                   Cecile Coder, [facility]               Cecile Coder, [facility]               Cecile Coder, [facility]               Cecile Coder, [facility]  [place] [facility] Counselor/Therapist Progress Note  Patient ID: Zahki Hoogendoorn, MRN: [id number],    Date: 04/01/2024 This session was held via video teletherapy. The patient consented to the video teletherapy and was located in his home d uring this session. He is aware it is the responsibility of the patient to secure confidentiality on his end of the session. The provider was in a private home office for the duration of this session.   The patient arrived on time for her Caregility session  Time Spent: 59 minutes, 2 PM to 2:59 PM  Treatment Type: Individual Therapy Reported Symptoms: anxiety, panic attacks The patient said he jokingly said to his wife that he h ad not had any pain issues in the cervical area and then a few days ago he started having pain in his neck and shoulders.  He now sits related to degenerative disk and has some consistent pain somewhere in his spine with that but jokingly says it just depends on Wednesday which part of his body is decides to show up and hurt.  He is doing the best that he can.  He is still walking which he knows is good for him but does not have right n ow the strength to do as much in the morning as he has done in the past so he is denying that to 3 times a day for consistency.  He is still doing some positive things and that he is  staying in touch with his brother and other family members.  He is reconnecting with friends from high school and he is which she is enjoying.  One of them has had a similar experience in that she lost her son in the same way the patient lost his daughter.   He said they did not think and Easter in which they set a place for his daughter at the table and put her picture there.  He initially had reservations about doing that he and his wife had made everyone feel as if she was really there with him.  He still feels that he is grieving in a healthy way.  He and his wife have always taken a trip around both of their birthdays which are 2 days apart.  That is now close to his daughter died so  they have started taking some of her rashes with him and sprinkling them where they go as a way of remembrance.  They are deciding what that looks like this year in July.  He does contract for safety having no thoughts of hurting himself or anyone else. We will continue to work on coping skills for helping with his anxiety and the progression of his Lewy body dementia diagnosis as well as processing his grief. 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 Speech/Language:  Normal Rate Affect: Appropriate Mood: normal Thought process: normal Thought content:  WNL Sensory/Perceptual disturbances:  WNL Orientation: oriented to person, place, and situation Attention: Good Concentration: Good Memory: Impaired short term Fund of knowledge:  Good Insight:  Good Judg ment:  Good Impulse Control: Good  Risk Assessment: Danger to Self: No Self-injurious Behavior: No Danger to Others: No Duty to Warn:no Physical Aggression / Violence:No  Access to Firearms a concern: No  Gang Involvement:No  Treatment plan: Will employ cognitive behavioral therapy as well as grief and loss therapy for relief of anxiety and grief.  Goals  of grief therapy  or to have a healthier response to the loss of his dau ghter and less sadness as evidenced by patient report in therapy notes as well as to help him feel less overwhelmed by her loss.  Goals are to see a 50% reduction in grief symptoms over the next 6 months.  I will encourage the use of the patient telling his grief story about his daughter including encouraging him to bring pictures and memorabilia to help process his feelings including any feelings of guilt associated with her loss.  We  will use cognitive behavioral therapy to help identify and change anxiety producing thoughts and behavior patterns so as to improve his ability to better manage anxiety and stress, and manage thoughts and worrisome thinking contributing to anxiety.  We will also refresh and encourage dialectical behavior therapy distress tolerance and mindfulness skills with the intention of reducing anxiety by 50% over the next 6 months. Progress: 30%  Interventions: Cognitive Behavioral Therapy  Diagnosis:PTSD, Bi-Polar D/O  Plan: F/U/ appointments scheduled weekly.  Cecile Coder, [facility]                                                                                                                   Cecile Coder, [facility]  [place] [facility] Counselor/Therapist Progress Note  Patient ID: Naveen Clardy, MRN: [id number],    Date: 04/01/2024 This session was held  via video teletherapy. The patient consented to the video teletherapy and was located in his home during this session. He is aware it is the responsibility of the patient to secure confidentiality on his end of the session. The provider was in a private home office for the duration of this session.   The patient arrived on time for her Caregility session  Time Spent: 58 minutes, 2 PM to 2:58 PM  Treatment Type: Individual Therapy  Reported Symptoms: anxiety, panic attacks A fairly difficult week because it was his daughter's birthday.  He said this year was more difficult than last year but he could not pinpoint why.  They did remember her about going out to a nice restaurant which is something her daughter always wanted to do.  He said they were able to laugh and joke about funny things that his daughter had said or done which is not something they were able Stephen Myers o do this time last year so he saw that as healthy grieving.  He knows that the time change threw things off a little bit for everybody and his wife still is not feeling well and his  back has been bothering him.  He was not able to go to his first round of physical therapy this week because his back was bothering him more.  He has been thinking a lot more about his daughter which he feels comfortable with.  He is thankful that it is get ting warmer and staying light longer so he can get back to some of the coping skills such as fishing that he has done in the past.   We will continue to work on coping skills for helping with his anxiety and the progression of his Lewy body dementia diagnosis as well as processing his grief. Mental Status Exam: Appearance:  Well Groomed  Behavior: Appropriate Motor: Pt. Reports some instability due to Lewey Body dementia Speec h/Language:  Normal Rate Affect: Appropriate Mood: normal Thought process: normal Thought content:  WNL Sensory/Perceptual disturbances:  WNL Orientation: oriented to person, place, and situation Attention: Good Concentration: Good Memory: Impaired short term Fund of knowledge:  Good Insight:  Good Judgment:  Good Impulse Control: Good  Risk Assessment: Danger to Self: No Self-injurious Behavior: No Danger to Other s: No Duty to Warn:no Physical Aggression / Violence:No  Access to Firearms a concern: No  Gang Involvement:No  Treatment plan: Will employ cognitive behavioral therapy as well as grief and loss therapy for relief of anxiety and grief.  Goals of grief therapy or to have a healthier response to the loss of his daughter and less sadness as evidenced by patient report in therapy notes as well as to help him feel less overwhelmed by he r loss.  Goals are to see a 50% reduction in grief symptoms over the next 6 months.  I will encourage the use of the patient telling his grief story about his daughter including encouraging him to bring pictures and memorabilia to help process his feelings including any feelings of guilt associated with her loss.  We will use cognitive behavioral therapy to help  identify and change anxiety producing thoughts and behavior patterns so as  to improve his ability to better manage anxiety and stress, and manage thoughts and worrisome thinking contributing to anxiety.  We will also refresh and encourage dialectical behavior therapy  distress tolerance and mindfulness skills with the intention of reducing anxiety by 50% over the next 6 months. Progress: 30% Interventions: Cognitive Behavioral Therapy  Diagnosis:PTSD, Bi-Polar D/O  Plan: F/U/ appointments scheduled weekly .  Cecile Coder, [facility]                                                                                                                   Cecile Coder, [facility]               Cecile Coder, [facility]               Cecile Coder, [facility]  [place] [facility] Counselor/Therapist Progress Note  Patient ID: Kiyoshi Schaab, MRN: [id number],    Date: 04/01/2024 This session was held via video teletherapy. The patient consen ted to the video teletherapy and was located in his home during this session. He is aware it is the responsibility of the patient to secure confidentiality on his end of the session. The provider was in a private home office for the duration of this session.   The patient arrived on time for her Caregility session  Time Spent: 1:05 PM until 2 PM, 55 minutes Treatment Type: Individual Therapy Reported Symptoms: anxiety, panic atta cks The patient said he came in from his walk this morning and saw what he thought was some sort of tic from his wife.  It was not the first time it happened and he wonders if it might be tardive dyskinesia because of some new medications that she is on.  She has a call into the doctor right now and hopes to hear something back soon.  They have narrowed down to the fact they think most of her discomfort in breathing is coming from the  diaphragm and they are looking for alternatives for treatment.  The patient continues to do those things which help him cope including walking, fishing when he can, spending time with his son and daughter-in-law and grandson.  He also has a way of continued coping with grief he is writing letters to his daughters as a way of expressing his feelings and says that is very beneficial.  He appears to be grieving in a healthy way but he kn ows that he to year anniversary of her death is coming up and there is some grief there but says it is not overwhelming.  We will continue to work on coping skills for helping with  his anxiety and the progression of his Lewy body dementia diagnosis as well as processing his grief. Mental Status Exam: Appearance:  Well Groomed  Behavior: Appropriate Motor: Pt. Reports some instability due to Lewey Body dementia Speech/Language:   Normal Rate Affect: Appropriate Mood: normal Thought process: normal Thought content:  WNL Sensory/Perceptual disturbances:  WNL Orientation: oriented to person, place, and situation Attention: Good Concentration: Good Memory: Impaired short term Fund of knowledge:  Good Insight:  Good Judgment:  Good Impulse Control: Good  Risk Assessment: Danger to Self: No Self-injurious Behavior: No Danger to Others: No Duty  to Warn:no Physical Aggression / Violence:No  Access to Firearms a concern: No  Gang Involvement:No  Treatment plan: Will employ cognitive behavioral therapy as well as grief and loss therapy for relief of anxiety and grief.  Goals of grief therapy or to have a healthier response to the loss of his daughter and less sadness as evidenced by patient report in therapy notes as well as to help him feel less overwhelmed by her loss.  [place] are to see a 50% reduction in grief symptoms over the next 6 months.  I will encourage the use of the patient telling his grief story about his daughter including encouraging him to bring pictures and memorabilia to help process his feelings including any feelings of guilt associated with her loss.  We will use cognitive behavioral therapy to help identify and change anxiety producing thoughts and behavior patterns so as to improve h is ability to better manage anxiety and stress, and manage thoughts and worrisome thinking contributing to anxiety.  We will also refresh and encourage dialectical behavior therapy distress tolerance and  mindfulness skills with the intention of reducing anxiety by 50% over the next 6 months. Progress: 30% Interventions: Cognitive Behavioral  Therapy  Diagnosis:PTSD, Bi-Polar D/O  Plan: F/U/ appointments scheduled weekly.  Cecile Coder, [facility]                                                                                                                   Cecile Coder, [facility]  [place] [facility] Counselor/Therapist Progress Note  Patient ID: Cincere Zorn, MRN: [id number],    Date: 04/01/2024 This session was held via video teletherapy. The patient consented to the video teletherapy and was located in his home during this session. He is aware it  is the responsibility of the patient to secure confidentiality on his end of the  session. The provider was in a private home office for the duration of this session.   The patient arrived on time for her Caregility session  Time Spent: 59 minutes, 2 PM to 2:59 PM  Treatment Type: Individual Therapy Reported Symptoms: anxiety, panic attacks The patient continues to present with physical pain and both his back and his knee.  He s tarted with a new physical therapist and told him that there was a lot of arthritis in both knees and or certain things he cannot do but they had him do that anyway and now for the past 3 days he has been in significant knee pain.  He has continued to walk because he knows that is good for him in various ways but says it has been painful.  His wife also was not feeling well but she is going back to the doctor next week to have an ablati on and hopes that will bring her some relief.  For the most part he has been at the last couple weeks reaching back out and hearing from some old friends as well as time with his son and son's family.  He says that his wife's cousin is coming down for a weekend soon and they enjoy time with her and that he will go to his brother's house sometime in the next few weeks.  He recognizes the need for people that he cares about in helping wit h his depression. We will continue to work on coping skills for helping with his anxiety and the progression of his Lewy body dementia diagnosis as well as processing his grief. Mental Status Exam: Appearance:  Well Groomed  Behavior: Appropriate Motor: Pt. Reports some instability due to Lewey Body dementia Speech/Language:  Normal Rate Affect: Appropriate Mood: normal Thought process: normal Thought content:  WNL Sensory /Perceptual disturbances:  WNL Orientation: oriented to person, place, and situation Attention: Good Concentration: Good Memory: Impaired short term Fund of knowledge:  Good Insight:  Good Judgment:  Good Impulse Control: Good  Risk Assessment: Danger to  Self: No Self-injurious Behavior: No Danger to Others: No Duty to Warn:no Physical Aggression / Violence:No  Access to Firearms a concern: No  Gang Involvement:No   Treatment plan: Will employ cognitive behavioral therapy as well as grief and loss therapy for relief of anxiety and grief.  Goals of grief therapy or to have a healthier response to the loss of his daughter and less sadness as evidenced by patient report in therapy notes as well as to help him feel less overwhelmed by her loss.  Goals are to see a 50% reduction in grief symptoms over the next 6 months.  I will encourage the use of the  patient telling his grief story about his daughter including encouraging him to bring pictures and memorabilia to help process his feelings including any feelings of guilt associated with her loss.  We will use cognitive behavioral therapy to help identify and change anxiety producing thoughts and behavior patterns so as to improve his ability to better manage anxiety and stress, and manage thoughts and worrisome thinking contributing Stephen Myers o anxiety.  We will  also refresh and encourage dialectical behavior therapy distress tolerance and mindfulness skills with the intention of reducing anxiety by 50% over the next 6 months. Progress: 30% Interventions: Cognitive Behavioral Therapy  Diagnosis:PTSD, Bi-Polar D/O  Plan: F/U/ appointments scheduled weekly.  Cecile Coder, [facility]                                                                                                                    Cecile Coder, [facility]               Cecile Coder, [facility]               Cecile Coder, [facility]               Cecile Coder, [facility]  [place] [facility] Counselor/Therapist Progress Note  Patient ID: Daksh Coates, MRN: [id number],    Date: 04/01/2024 This session was held via video teletherapy. The patient consented to the video teletherapy and was located in his ho me during this session. He is aware it is the responsibility of the patient to secure confidentiality on his end of the session. The provider was in a private home office for the duration of this session.   The patient arrived on time for her Caregility session  Time Spent: 57 minutes, 2 PM to 2:57 PM  Treatment Type: Individual Therapy Reported Symptoms: anxiety, panic attacks The patient is still having significant back pain.   He cannot sit anywhere for very long and has to be careful where he sits.  There is still some unsteadiness although he did not report any falls over the past 2 weeks.  His biggest  concern now is for his wife.  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  She is meeting with her cardiologist again next week to see what possible treatment is for her.  He sti ll thinks there may be some issue with her diaphragm but she is meeting with the pulmonologist also.  He is doing what he can to help her.  His daughter's birthday is March 11 so he knows that is a difficult time for both he and his wife next week.  Typically they do something to remember her but because of the wife's current health issues they cannot go to [place] as they had planned so they are talking about what to do to reme mber and celebrate her birthday.  We will continue to work on coping skills for helping with his anxiety and the progression of his Lewy body dementia diagnosis as well as processing his grief. Mental Status Exam: Appearance:  Well Groomed  Behavior: Appropriate Motor: Pt. Reports some instability due to Lewey Body dementia Speech/Language:  Normal Rate Affect: Appropriate Mood: normal Thought process: normal Thought conten Stephen Myers:  WNL Sensory/Perceptual disturbances:  WNL Orientation: oriented to person, place, and situation Attention: Good Concentration: Good Memory: Impaired short term Fund of knowledge:  Good Insight:  Good Judgment:  Good Impulse Control: Good  Risk Assessment: Danger to Self: No Self-injurious Behavior: No Danger to Others: No Duty to Warn:no Physical Aggression / Violence:No  Access to Firearms a concern: No  Gang  Involvement:No  Treatment plan: Will employ cognitive behavioral therapy as well as grief and loss therapy for relief of anxiety and grief.  Goals of grief therapy or to have a healthier response to the loss of his daughter and less sadness as evidenced by patient report in therapy notes as well as to help him feel less overwhelmed by her loss.  Goals are to see a  50% reduction in grief symptoms over the next 6 months.  I will encourag e the use of the patient telling his grief story about his daughter including encouraging him to bring pictures and memorabilia to help process his feelings including any feelings of guilt associated with her loss.  We will use cognitive behavioral therapy to help identify and change anxiety producing thoughts and behavior patterns so as to improve his ability to better manage anxiety and stress, and manage thoughts and worrisome thinki ng contributing to anxiety.  We will also refresh and encourage dialectical behavior therapy distress tolerance and mindfulness  skills with the intention of reducing anxiety by 50% over the next 6 months. Progress: 30% Interventions: Cognitive Behavioral Therapy  Diagnosis:PTSD, Bi-Polar D/O  Plan: F/U/ appointments scheduled weekly.  Cecile Coder, [facility]                                                                                                                    Cecile Coder, [facility]  [place] [facility] Counselor/Therapist Progress Note  Patient ID: Casson Catena, MRN: [id number],    Date: 04/01/2024 This session was held via video teletherapy. The patient consented to the video teletherapy and was located in his home during this session. He is aware it is the responsibility of the patient to secure confidentiality on his end of the sessio n. The provider was in a private home office for the duration of this session.   The patient arrived on time for her Caregility session  Time Spent: 58 minutes, 2 PM to 2:58 PM  Treatment Type: Individual Therapy Reported Symptoms: anxiety, panic attacks A fairly difficult week because it was his daughter's birthday.  He said this year was more difficult than last year but he could not pinpoint why.  They did remember her about  going out to a nice restaurant which is something her daughter always wanted to do.  He said they were able to laugh and joke about funny things that his daughter had said or done which is not something they were able to do this time last year so he saw that as healthy grieving.  He knows that the time change threw things off a little bit for everybody and his wife still is not feeling well and his back has been bothering him.  He was n ot able to go to his first round of physical therapy this week because his back was bothering him more.  He has been thinking a lot more about his daughter which he feels comfortable  with.  He is thankful that it is getting warmer and staying light longer so he can get back to some of the coping skills such as fishing that he has done in the past.   We will continue to work on coping skills for helping with his anxiety and the progre ssion of his Lewy body dementia diagnosis as well as processing his grief. Mental Status Exam: Appearance:  Well Groomed  Behavior: Appropriate Motor: Pt. Reports some instability due to Lewey Body dementia Speech/Language:  Normal Rate Affect: Appropriate Mood: normal Thought process: normal Thought content:  WNL Sensory/Perceptual disturbances:  WNL Orientation: oriented to person, place, and situation Attention: Good  Concentration: Good Memory: Impaired short term Fund of knowledge:  Good Insight:  Good Judgment:  Good Impulse Control: Good  Risk Assessment: Danger to Self: No Self-injurious Behavior: No Danger to Others: No Duty to Warn:no Physical Aggression / Violence:No  Access to Firearms a concern: No  Gang Involvement:No  Treatment plan: Will employ cognitive behavioral therapy as well as grief and loss therapy for relief of  anxiety and grief.  Goals of grief therapy or to have a healthier response to the loss of his daughter and less sadness as evidenced by patient report in therapy notes as well as to help him feel less overwhelmed by her loss.  Goals are to see a 50% reduction in grief symptoms over the next 6 months.  I will encourage the use of the patient telling his grief story about his daughter including encouraging him to bring pictures and memor abilia to help process his feelings including any feelings of guilt associated with her loss.  We will use cognitive behavioral therapy to help identify and change anxiety producing thoughts and behavior patterns so as to improve his ability to better manage anxiety and stress, and manage thoughts and worrisome thinking contributing to anxiety.  We will also refresh  and encourage dialectical behavior therapy distress  tolerance and mindf ulness skills with the intention of reducing anxiety by 50% over the next 6 months. Progress: 30% Interventions: Cognitive Behavioral Therapy  Diagnosis:PTSD, Bi-Polar D/O  Plan: F/U/ appointments scheduled weekly.  Cecile Coder, [facility]                                                                                                                    Cecile Coder, [facility]               Cecile Coder, [facility]               Cecile Coder, [facility]  [place] [facility] Counselor/Therapist Progress Note  Patient ID:  Edinson Domeier, MRN: [id number],    Date: 04/01/2024 This session was held via video teletherapy. The patient consented to the video teletherapy and was located in his home during this session. He is aware it is the responsibility of the patient to secure confidentiality on his end of the session. The provider was in a priv ate home office for the duration of this session.   The patient arrived on time for her Caregility session  Time Spent: 57 minutes, 2 PM to 2:57 PM  Treatment Type: Individual Therapy Reported Symptoms: anxiety, panic attacks The patient is still having significant back pain.  He cannot sit anywhere for very long and has to be careful where he sits.  There is still some unsteadiness although he did not report any falls over the  past 2 weeks.  His biggest concern now is for his wife.  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  She is meeting with her cardiologist again next week to see what possible treatment is for her.  He still thinks there may be some issue with her diaphragm but she is meeting with the pulmonologist also.  He is doing what he can to help her.  His daughter' s birthday is March 11 so he knows that is a difficult time for both he and his wife next week.  Typically they do something to remember her but because of the wife's current health issues they cannot go to [place] as they had planned so they are talking about what to do to remember and celebrate her birthday.  We will continue to work on coping skills for helping with his anxiety and the progression of his Lewy body dementia di agnosis as well as processing his grief. Mental Status Exam: Appearance:  Well Groomed  Behavior: Appropriate Motor: Pt. Reports some instability due to Lewey Body dementia Speech/Language:  Normal Rate Affect: Appropriate Mood: normal Thought process: normal Thought content:   WNL Sensory/Perceptual disturbances:  WNL Orientation: oriented to person, place, and situation Attention: Good Concentration: Good Memory: Impa ired short term Fund of knowledge:  Good Insight:  Good Judgment:  Good Impulse Control: Good  Risk Assessment: Danger to Self: No Self-injurious Behavior: No Danger to Others: No Duty to Warn:no Physical Aggression / Violence:No  Access to Firearms a concern: No  Gang Involvement:No  Treatment plan: Will employ cognitive behavioral therapy as well as grief and loss therapy for relief of anxiety and grief.  Goals of grie f therapy or to have a healthier response to the loss of his daughter and less sadness as evidenced by patient report in therapy notes as well as to help him feel less overwhelmed by her loss.  Goals are to see a 50% reduction in grief symptoms over the next 6 months.  I will encourage the use of the patient telling his grief story about his daughter including encouraging him to bring pictures and memorabilia to help process his feeling s including any feelings of guilt associated with her loss.  We will use cognitive behavioral therapy to help identify and change anxiety producing thoughts and behavior patterns so as to improve his ability to better manage anxiety and stress, and manage thoughts and worrisome thinking contributing to anxiety.  We will also refresh and encourage dialectical behavior therapy distress tolerance and mindfulness skills  with the intention o f reducing anxiety by 50% over the next 6 months. Progress: 30% Interventions: Cognitive Behavioral Therapy  Diagnosis:PTSD, Bi-Polar D/O  Plan: F/U/ appointments scheduled weekly.  Cecile Coder, [facility]                                                                                                                    Cecile Coder,  [facility]  [place] [facility] Counselor/Therapist Progress Note  Patient ID: Rajat Staver By rd, MRN: [id number],    Date: 04/01/2024 This session was held via video teletherapy. The patient consented to the video teletherapy and was located in his home during this session. He is aware it is the responsibility of the patient to secure confidentiality on his end of the session. The provider was in a private home office for the duration of this session.   The patient arrived on time for her Caregility session  Time Spent: 5 7 minutes, 2 PM to 2:57 PM  Treatment Type: Individual Therapy Reported Symptoms: anxiety, panic attacks  We will continue to work on coping skills for helping with his anxiety and the progression  of his Lewy body dementia diagnosis as well as processing his grief. Mental Status Exam: Appearance:  Well Groomed  Behavior: Appropriate Motor: Pt. Reports some instability due to Lewey Body dementia Speech/Language:  Normal Rate  Affect: Appropriate Mood: normal Thought process: normal Thought content:  WNL Sensory/Perceptual disturbances:  WNL Orientation: oriented to person, place, and situation Attention: Good Concentration: Good Memory: Impaired short term Fund of knowledge:  Good Insight:  Good Judgment:  Good Impulse Control: Good  Risk Assessment: Danger to Self: No Self-injurious Behavior: No Danger to Others: No Duty to Warn:no Ph ysical Aggression / Violence:No  Access to Firearms a concern: No  Gang Involvement:No  Treatment plan: Will employ cognitive behavioral therapy as well as grief and loss therapy for relief of anxiety and grief.  Goals of grief therapy or to have a healthier response to the loss of his daughter and less sadness as evidenced by patient report in therapy notes as well as to help him feel less overwhelmed by her loss.  Goals are to see  a 50% reduction in grief symptoms over the next 6 months.  I will encourage the use of the patient telling his grief story about his daughter including encouraging him to bring pictures and memorabilia to help process his feelings including any feelings of guilt associated with her loss.  We will use cognitive behavioral therapy to help identify and change anxiety producing thoughts and behavior patterns so as to improve his ability to  better manage anxiety and stress, and manage thoughts and worrisome thinking contributing to anxiety.  We will also refresh and encourage dialectical behavior therapy distress tolerance and mindfulness skills with the intention of reducing anxiety by 50% over the next 6 months. Progress: 30% Interventions: Cognitive Behavioral Therapy  Diagnosis:PTSD, Bi-Polar D/O  Plan: F/U/  appointments scheduled weekly.  Cecile Coder, [facility]                                                                                                                    Cecile Coder, [facility]               Cecile Coder, [facility]               Cecile Coder, [facility]  Cecile Coder, [facility]               Cecile Coder, [facility]               Cecile Coder, [facility]               Cecile Coder, [facility]  [place] [facility] Counselor/Therapist Progress Note  Patient ID: Shoua Ressler, MRN: [id number],    Date:  04/01/2024 This session was held via video teletherapy. The patient consented to the video teletherapy and was located in his home during this session. He is aware it is the responsibility of the patient to secure confidentiality on his end of the session. The provider was in a private home office for the duration of this  session.   The patient arrived on time for her Caregility session  Time Spent: 39 minutes, 2 PM to 2:39 PM  Treatment Type: Individual Therapy Reported Symptoms: anxiety, panic attacks The patient was not feeling well today.  He started feeling really earlier in the week having a fever bad headaches body aches.  He felt a little better yesterday but feels a little bit of a resurgence in headaches and body aches and is running a  low-grade fever.  His wife is feeling some of the same symptoms.  For the most part he says he is doing well.  They did increase his memory medication because they were starting to see some increasing decline in short-term memory and he said until recently his long-term memory had been good but he was starting to see a difference in how he used to markers to remember what happened at certain times historically.  He feels the medication  has helped and has brightened his mood a little also.  He is still trying to walk every day and fish when he can and has enjoyed watching the [employer] as a good distraction.  He reports that he feels he is managing mood fairly well.  He has an appointment in 2 weeks.  He does contract for safety having no thoughts of hurting himself or anyone else. We will continue to work on coping skills for helping with his anxiety  and the progression of his Lewy body dementia diagnosis as well as processing his grief. Mental Status Exam: Appearance:  Well Groomed  Behavior: Appropriate Motor: Pt. Reports some instability due to Lewey Body dementia Speech/Language:  Normal Rate Affect: Appropriate Mood: normal Thought  process: normal Thought content:  WNL Sensory/Perceptual disturbances:  WNL Orientation: oriented to person, place, and situation At tention: Good Concentration: Good Memory: Impaired short term Fund of knowledge:  Good Insight:  Good Judgment:  Good Impulse Control: Good  Risk Assessment: Danger to Self: No Self-injurious Behavior: No Danger to Others: No Duty to Warn:no Physical Aggression / Violence:No  Access to Firearms a concern: No  Gang Involvement:No  Treatment plan: Will employ cognitive behavioral therapy as well as grief and loss therapy  for relief of anxiety and grief.  Goals of grief therapy or to have a healthier response to the loss of his daughter and less sadness as evidenced by patient report in therapy notes as well as to help him feel less overwhelmed by her loss.  Goals are to see a 50% reduction in grief symptoms over the next 6 months.  I will encourage the use of the patient telling his grief story about his daughter including encouraging him to bring pict ures and memorabilia to help process his feelings including any feelings of guilt associated with her loss.  We will use cognitive behavioral therapy to help identify and change anxiety producing thoughts and behavior patterns so as to improve his ability to better manage anxiety and stress, and manage thoughts and worrisome thinking contributing to anxiety.  We will also refresh  and encourage dialectical behavior therapy distress toler ance and mindfulness skills with the intention of reducing anxiety by 50% over the next 6 months. Progress: 30% Interventions: Cognitive Behavioral Therapy  Diagnosis:PTSD, Bi-Polar D/O  Plan: F/U/ appointments scheduled weekly.  Cecile Coder, [facility]                                                                                                                     Cecile Coder, [facility]  [place] [facility] Counselor/Therap ist Progress Note  Patient ID: Breeze Angell, MRN: [id number],    Date: 04/01/2024 This session was held via video teletherapy. The patient consented to the video teletherapy and was located in his home during this session. He is aware it is the responsibility of the patient to secure confidentiality on his end of the session. The provider was in a private home office for the duration of this session.   The patient arrived on  time for her Caregility session  Time Spent: 58 minutes, 2 PM to 2:58 PM  Treatment Type: Individual Therapy Reported Symptoms: anxiety, panic attacks A fairly difficult week  because it was his daughter's birthday.  He said this year was more difficult than last year but he could not pinpoint why.  They did remember her about going out to a nice restaurant which is something her daughter always wanted to do.  He said they were able  to laugh and joke about funny things that his daughter had said or done which is not something they were able to do this time last year so he saw that as healthy grieving.  He knows that the time change threw things off a little bit for everybody and his wife still is not feeling well and his back has been bothering him.  He was not able to go to his first round of physical therapy this week because his back was bothering him more.  He  has been thinking a lot more about his daughter which he feels comfortable with.  He is thankful that it is getting warmer and staying light longer so he can get back to some of the coping skills such as fishing that he has done in the past.   We will continue to work on coping skills for helping with his anxiety and the progression of his Lewy body dementia diagnosis as well as processing his grief. 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 Speech/Language:  Normal Rate Affect: Appropriate Mood: normal Thought process: normal Thought content:  WNL Sensory/Perceptual disturbances:  WNL Orientation: oriented to person, place, and situation Attention: Good Concentration: Good Memory: Impaired short term Fund of knowledge:  Good Insight:  Good Judgment:  Go od Impulse Control: Good  Risk Assessment: Danger to Self: No Self-injurious Behavior: No Danger to Others: No Duty to Warn:no Physical Aggression / Violence:No  Access to Firearms a concern: No  Gang Involvement:No  Treatment plan: Will employ cognitive behavioral therapy as well as grief and loss therapy for relief of anxiety and grief.  Goals of  grief therapy or to have a healthier response to the loss of his daughter and  less sadness as evidenced by patient report in therapy notes as well as to help him feel less overwhelmed by her loss.  Goals are to see a 50% reduction in grief symptoms over the next 6 months.  I will encourage the use of the patient telling his grief story about his daughter including encouraging him to bring pictures and memorabilia to help process his feelings including any feelings of guilt associated with her loss.  We will use  cognitive behavioral therapy to help identify and change anxiety producing thoughts and behavior patterns so as to improve his ability to better manage anxiety and stress, and manage thoughts and worrisome thinking contributing to anxiety.  We will also refresh and encourage dialectical behavior therapy  distress tolerance and mindfulness skills with the intention of reducing anxiety by 50% over the next 6 months. Progress: 30% Interve ntions: Cognitive Behavioral Therapy  Diagnosis:PTSD, Bi-Polar D/O  Plan: F/U/ appointments scheduled weekly.  Cecile Coder, [facility]                                                                                                                   Cecile Coder, [facility]               Cecile Coder, [facility]                Cecile Coder, [facility]  [place] [facility] Counselor/Therapist Progress Note  Patient ID: Giacomo Valone, MRN: [id number],    Date: 04/01/2024 This session was held via video teletherapy. The patient consented to the video teletherapy and was located in his home during this session. He is aware it is the responsibility of the patient to secure confidentiality on his end of the session. The provider was in a private home office for the duration of this session.   The patient arrived on time for her Caregility session  Time S pent: 1:05 PM until 2 PM, 55 minutes Treatment Type: Individual Therapy Reported Symptoms: anxiety, panic attacks The patient said he came in from his walk this morning and saw what he thought was some sort of tic from his wife.  It was not the first time it happened and he wonders if it might be tardive dyskinesia because of some new medications that she is on.  She has a call into the doctor right now and hopes to hear something ba ck soon.  They have narrowed down to the fact they think most of her discomfort in breathing is coming from the diaphragm and they are looking for alternatives for treatment.  The  patient continues to do those things which help him cope including walking, fishing when he can, spending time with his son and daughter-in-law and grandson.  He also has a way of continued coping with grief he is writing letters to his daughters as a way of  expressing his feelings and says that is very beneficial.  He appears to be grieving in a healthy way but he knows that he to year anniversary of her death is coming up and there is some grief there but says it is not overwhelming.  We will continue to work on coping skills for helping with his anxiety and the progression of his Lewy body dementia diagnosis as well as processing his grief. Mental Status Exam: Appearance:  Well Groo med  Behavior: Appropriate Motor: Pt. Reports some instability due to Lewey Body dementia Speech/Language:  Normal Rate Affect: Appropriate Mood: normal Thought process: normal Thought content:  WNL Sensory/Perceptual disturbances:  WNL Orientation: oriented to person, place, and situation Attention: Good Concentration: Good Memory: Impaired short term Fund of knowledge:  Good Insight:  Good Judgment:  Good Impulse  Control: Good  Risk Assessment: Danger to Self: No Self-injurious Behavior: No Danger to Others: No Duty to Warn:no Physical Aggression / Violence:No  Access to Firearms a concern: No  Gang Involvement:No  Treatment plan: Will employ cognitive behavioral therapy as well as grief and loss therapy for relief of anxiety and grief.  Goals of grief therapy or to have a healthier response to the loss of his daughter and less sadnes s as evidenced by patient report in therapy notes as well as to help him feel less overwhelmed by her loss.  Goals are to see a 50% reduction in grief symptoms over the next 6 months.  I will encourage the use of the patient telling his grief story about his daughter including encouraging him to bring pictures and memorabilia to help process his feelings including  any feelings of guilt associated with her loss.  We will use cognitive be havioral therapy to help identify and change anxiety producing thoughts and behavior patterns so as to improve his ability to better manage anxiety and stress, and manage thoughts and worrisome thinking contributing to anxiety.  We will also refresh and encourage dialectical behavior therapy distress tolerance  and mindfulness skills with the intention of reducing anxiety by 50% over the next 6 months. Progress: 30% Interventions: Cogn itive Behavioral Therapy  Diagnosis:PTSD, Bi-Polar D/O  Plan: F/U/ appointments scheduled weekly.  Cecile Coder, [facility]                                                                                                                   Cecile Coder, [facility]  [place] [facility] Counselor/Therapist Progress Note  Patient ID: Kariem Wolfson, MRN: [id number],    Date: 04/01/2024 This session was held via video teletherapy . The patient consented to the video teletherapy and was located in his home during this session. He is aware it is the responsibility of the patient to secure confidentiality on his end of the session. The provider was in a private home office for the duration of this session.   The patient arrived on time for her Caregility session  Time Spent: 59 minutes, 2 PM to 2:59 PM  Treatment Type: Individual Therapy Reported Symptoms: a nxiety, panic attacks The patient continues to present with physical pain and both his back and his knee.  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  He has continued to walk because he knows that is good for him in various ways but says it has b een painful.  His wife also was not feeling well but she is going back to the doctor next week to have an ablation and hopes that will bring her some relief.  For the most part he has been at the last couple weeks reaching back out and hearing from some old friends as well as time with his son and son's family.  He says that his wife's cousin is coming down for a weekend soon and they enjoy time with her and that he will go to his broth er's house sometime in the next few weeks.  He recognizes the need for people that he cares about in helping with his depression. We will continue to work on coping skills for helping  with his anxiety and the progression of his Lewy body dementia diagnosis as well as processing his grief. Mental Status Exam: Appearance:  Well Groomed  Behavior: Appropriate Motor: Pt. Reports some instability due to Lewey Body dementia Speech/Lan guage:  Normal Rate Affect: Appropriate Mood: normal Thought process: normal Thought content:  WNL Sensory/Perceptual disturbances:  WNL Orientation: oriented to person, place, and situation Attention: Good Concentration: Good Memory: Impaired short term Fund of knowledge:  Good Insight:  Good Judgment:  Good Impulse Control: Good  Risk Assessment: Danger to Self: No Self-injurious Behavior: No Danger to Others: No  Duty to Warn:no Physical Aggression / Violence:No  Access to Firearms a concern: No  Gang Involvement:No  Treatment plan: Will employ cognitive behavioral therapy as well as grief and loss therapy for relief of anxiety and grief.  Goals of grief therapy or to have a healthier response to the loss of his daughter and less sadness as evidenced by patient report in therapy notes as well as to help him feel less overwhelmed by her los s.  Goals are to see a 50% reduction in grief symptoms over the next 6 months.  I will encourage the use of the patient telling his grief story about his daughter including encouraging him to bring pictures and memorabilia to help process his feelings including any feelings of guilt associated with her loss.  We will use cognitive behavioral therapy to help identify and change anxiety producing thoughts and behavior patterns so as to im prove his ability to better manage anxiety and stress, and manage thoughts and worrisome thinking contributing to anxiety.  We will also  refresh and encourage dialectical behavior therapy distress tolerance and mindfulness skills with the intention of reducing anxiety by 50% over the next 6 months. Progress: 30% Interventions: Cognitive Behavioral  Therapy  Diagnosis:PTSD, Bi-Polar D/O  Plan: F/U/ appointments scheduled weekly.   Cecile Coder, [facility]                                                                                                                   Cecile Coder, [facility]               Cecile Coder, [facility]               Cecile Coder, [facility]               Cecile Coder, [facility]  [facility] Counselor/Therapist Progress Note  Patient ID: Handsome Anglin, MRN: [id number],    Date: 04/01/2024 This  session was held via video teletherapy. The patient consented to the video teletherapy and was  located in his home during this session. He is aware it is the responsibility of the patient to secure confidentiality on his end of the session. The provider was in a private home office for the duration of this session.   The patient arrived on time for her Caregility session  Time Spent: 57 minutes, 2 PM to 2:57 PM  Treatment Type: In dividual Therapy Reported Symptoms: anxiety, panic attacks The patient is still having significant back pain.  He cannot sit anywhere for very long and has to be careful where he sits.  There is still some unsteadiness although he did not report any falls over the past 2 weeks.  His biggest concern now is for his wife.  She has been having some difficulty breathing and they did a test so they found a small hole on the back side of her  heart.  She is meeting with her cardiologist again next week to see what possible treatment is for her.  He still thinks there may be some issue with her diaphragm but she is meeting with the pulmonologist also.  He is doing what he can to help her.  His daughter's birthday is March 11 so he knows that is a difficult time for both he and his wife next week.  Typically they do something to remember her but because of the wife's curren Stephen Myers health issues they cannot go to [place] as they had planned so they are talking about what to do to remember and celebrate her birthday.  We will continue to work on coping skills for helping with his anxiety and the progression of his Lewy body dementia diagnosis as well as processing his grief. Mental Status Exam: Appearance:  Well Groomed  Behavior: Appropriate Motor: Pt. Reports some instability due to Lewey Body dem entia Speech/Language:  Normal Rate Affect: Appropriate Mood: normal Thought process: normal Thought content:  WNL Sensory/Perceptual disturbances:  WNL Orientation: oriented to person, place, and situation Attention: Good Concentration: Good Memory: Impaired short  term Fund of knowledge:  Good Insight:  Good Judgment:  Good Impulse Control: Good  Risk Assessment: Danger to Self: No Self-injurious Behavior: No Dan ger to Others: No Duty to Warn:no Physical Aggression / Violence:No  Access to Firearms a concern: No  Gang Involvement:No  Treatment plan: Will employ cognitive behavioral therapy as well as grief and loss therapy for relief of anxiety and grief.  Goals of grief therapy or to have a healthier response to the loss of his daughter and less sadness as evidenced by patient report in therapy notes as well as to help him feel less overw helmed by her loss.  Goals are to see a 50% reduction in grief symptoms over the next 6 months.  I will encourage the use of the patient telling his grief story about his daughter including encouraging him to bring pictures and memorabilia to help process his feelings including any feelings of guilt associated with her loss.  We will use cognitive behavioral therapy to help identify and change anxiety producing thoughts and behavior pat terns so as to improve his ability to better manage anxiety and stress, and manage thoughts and worrisome thinking contributing to anxiety.  We will also refresh and encourage dialectical behavior therapy distress tolerance and mindfulness  skills with the intention of reducing anxiety by 50% over the next 6 months. Progress: 30% Interventions: Cognitive Behavioral Therapy  Diagnosis:PTSD, Bi-Polar D/O  Plan: F/U/ appointments sche duled weekly.  Cecile Coder, [facility]                                                                                                                   Cecile Coder, [facility]  [facility] Counselor/Therapist Progress Note  Patient ID: Raynold Blankenbaker, MRN: [id number],    Date: 04/01/2024 This session was held via video teletherapy. The patient consented to the video teletherapy and was located in his home during this  session. He is aware it is the responsibility of the patient to secure confidentiality on his end of the session. The provider was in a private home office for the duration of this session.   The patient arrived on time for her Caregility session  Time Spent: 58 minutes, 2 PM to 2:58 PM  Treatment Type: Individual Therapy Reported Symptoms: anxiety, panic attacks A fairly difficult week because it was his daughter's birthday.   He said this year was more difficult than last year but he could not pinpoint why.  They did remember her about going out to a nice restaurant which is something her daughter always  wanted to do.  He said they were able to laugh and joke about funny things that his daughter had said or done which is not something they were able to do this time last year so he saw that as healthy grieving.  He knows that the time change threw things off  a little bit for everybody and his wife still is not feeling well and his back has been bothering him.  He was not able to go to his first round of physical therapy this week because his back was bothering him more.  He has been thinking a lot more about his daughter which he feels comfortable with.  He is thankful that it is getting warmer and staying light longer so he can get back to some of the coping skills such as fishing that he  has done in the past.   We will continue to work on coping skills for helping with his anxiety and the progression of his Lewy body dementia diagnosis as well as processing his grief. Mental Status Exam: Appearance:  Well Groomed  Behavior: Appropriate Motor: Pt. Reports some instability due to Lewey Body dementia Speech/Language:  Normal Rate Affect: Appropriate Mood: normal Thought process: normal Thought content:  WNL  Sensory/Perceptual disturbances:  WNL Orientation: oriented to person, place, and situation Attention: Good Concentration: Good Memory: Impaired short term Fund of knowledge:  Good Insight:  Good Judgment:  Good Impulse Control: Good  Risk Assessment: Danger to Self: No Self-injurious Behavior: No Danger to Others: No Duty to Warn:no Physical Aggression / Violence:No  Access to Firearms a concern: No  Arlean Labs nt:No  Treatment plan: Will employ cognitive behavioral therapy as well as grief and loss therapy for relief of anxiety and grief.  Goals of grief therapy or to have a healthier response to the loss of his daughter and less sadness as evidenced by patient report in therapy notes as well as to help him feel less overwhelmed by her loss.  Goals are to see a 50%  reduction in grief symptoms over the next 6 months.  I will encourage the use  of the patient telling his grief story about his daughter including encouraging him to bring pictures and memorabilia to help process his feelings including any feelings of guilt associated with her loss.  We will use cognitive behavioral therapy to help identify and change anxiety producing thoughts and behavior patterns so as to improve his ability to better manage anxiety and stress, and manage thoughts and worrisome thinking contri buting to anxiety.  We will also refresh and encourage dialectical behavior therapy  distress tolerance and mindfulness skills with the intention of reducing anxiety by 50% over the next 6 months. Progress: 30% Interventions: Cognitive Behavioral Therapy  Diagnosis:PTSD, Bi-Polar D/O  Plan: F/U/ appointments scheduled weekly.  Cecile Coder, [facility]                                                                                                                    Cecile Coder, [facility]               Cecile Coder, [facility]               Cecile Coder, [facility]  [facility] Counselor/Therapist Progress Note  Patient ID: Emric Kowalewski, MRN: [id number],    Date: 04/01/2024 This session was held via video teletherapy. The patient consented to the video teletherapy and was located in his home during this session. He is aware it is Stephen Myers he responsibility of the patient to secure confidentiality on his end of the session. The provider was in a private home office for the duration of this session.   The patient arrived on time for her Caregility session  Time Spent: 57 minutes, 2 PM to 2:57 PM  Treatment Type: Individual Therapy Reported Symptoms: anxiety, panic attacks The patient is still having significant back pain.  He cannot sit anywhere for very long and h as to be careful where he sits.  There is still some unsteadiness although he did not report any falls over the past 2 weeks.  His biggest concern now is for his wife.  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  She is meeting with her cardiologist again next week to see what possible treatment is for her.  He still thinks there may be some issue with her  diaphragm but she is meeting with the pulmonologist also.  He is doing what he can to help her.  His daughter's birthday is March 11 so he knows that is a difficult time for both he  and his wife next week.  Typically they do something to remember her but because of the wife's current health issues they cannot go to [place] as they had planned so they are talking about what to do to remember and celebrate her birthday.  We will  continue to work on coping skills for helping with his anxiety and the progression of his Lewy body dementia diagnosis as well as processing his grief. Mental Status Exam: Appearance:  Well Groomed  Behavior: Appropriate Motor: Pt. Reports some instability due to Lewey Body dementia Speech/Language:  Normal Rate Affect: Appropriate Mood: normal Thought process: normal Thought content:  WNL Sensory/Perceptual disturbances:   WNL Orientation: oriented to person, place, and situation Attention: Good Concentration: Good Memory: Impaired short term Fund of knowledge:  Good Insight:  Good Judgment:  Good Impulse Control: Good  Risk Assessment: Danger to Self: No Self-injurious Behavior: No Danger to Others: No Duty to Warn:no Physical Aggression / Violence:No  Access to Firearms a concern: No  Gang Involvement:No  Treatment plan: Will emplo y cognitive behavioral therapy as well as grief and loss therapy for relief of anxiety and grief.  Goals of grief therapy or to have a healthier response to the loss of his daughter and less sadness as evidenced by patient report in therapy notes as well as to help him feel less overwhelmed by her loss.  Goals are to see a 50% reduction in grief symptoms over the next 6 months.  I will encourage the use of the patient telling his grief  story about his daughter including encouraging him to bring pictures and memorabilia to help process his feelings including any feelings of guilt associated with her loss.  We will use cognitive behavioral therapy to help identify and change anxiety producing thoughts and behavior patterns so as to improve his ability to better manage anxiety and stress, and manage  thoughts and worrisome thinking contributing to anxiety.  We will also r efresh and encourage dialectical behavior therapy distress tolerance and mindfulness  skills with the intention of reducing anxiety by 50% over the next 6 months. Progress: 30% Interventions: Cognitive Behavioral Therapy  Diagnosis:PTSD, Bi-Polar D/O  Plan: F/U/ appointments scheduled weekly.  Cecile Coder, [facility]                                                                                                                    Cecile Coder, [facility]  [facility] Counselor/Therapist Progress Note  Patient ID: Javarus Dorner, MRN: [id number],    Date: 04/01/2024 This session was held via video teletherapy. The patient consented to the video teletherapy and was located in his home during this session. He is aware it is the responsibility of the patient to secure confidentiality on his end of the session. The provider was in a private home offic e for the duration of this session.   The patient arrived on time for her Caregility session  Time Spent: 57 minutes, 2 PM to 2:57 PM  Treatment Type: Individual Therapy Reported Symptoms: anxiety, panic attacks  The patient reported that he had not been sleeping well so he did speak with his psychiatrist about adjusting some meds.  Initially they adjusted too much up and he said he felt groggy for the entire next day but they  have found a combination now where he feels like he is getting more rested and only feels a little drowsy shortly after waking up..  There have been a couple times where he stumbled and one of the times he felt down but did not get hurt the other times he was able to catch himself.  He is trying to stay as active as he can walking daily and fishing with his wife when he can.  His biggest anxiety now is his wife has an upcoming surgery o n her diaphragm in 2 weeks so he knows that will be a fairly significant recovery time but also sees the difficulties she is having now and wants her to get some relief..  His son and daughter-in-law will be supports for them also.  He continues to speak with friends and family members as encouragement.  He also feels that he is grieving appropriately writing to his daughter on a regular basis about the things that are going on in his a nd his wife's lives.  We will continue to work on coping skills for helping with his anxiety and the progression of his Lewy body dementia diagnosis as well as processing his grief. Mental Status Exam: Appearance:  Well Groomed  Behavior: Appropriate Motor: Pt. Reports some instability  due to Lewey Body dementia Speech/Language:  Normal Rate Affect: Appropriate Mood: normal Thought process: normal Thought content:  WNL Se nsory/Perceptual disturbances:  WNL Orientation: oriented to person, place, and situation Attention: Good Concentration: Good Memory: Impaired short term Fund of knowledge:  Good Insight:  Good Judgment:  Good Impulse Control: Good  Risk Assessment: Danger to Self: No Self-injurious Behavior: No Danger to Others: No Duty to Warn:no Physical Aggression / Violence:No  Access to Firearms a concern: No  Gang Involvement: No  Treatment plan: Will employ cognitive behavioral therapy as well as grief and loss therapy for relief of anxiety and grief.  Goals of grief therapy or to have a healthier response to the loss of his daughter and less sadness as evidenced by patient report in therapy notes as well as to help him feel less overwhelmed by her loss.  Goals are to see a 50% reduction in grief symptoms over the next 6 months.  I will encourage the use of  the patient telling his grief story about his daughter including encouraging him to bring pictures and memorabilia to help process his feelings including any feelings of guilt associated with her loss.  We will use cognitive behavioral therapy to help identify and change anxiety producing thoughts and behavior patterns so as to improve his ability to  better manage anxiety and stress, and manage thoughts and worrisome thinking contribut ing to anxiety.  We will also refresh and encourage dialectical behavior therapy distress tolerance and mindfulness skills with the intention of reducing anxiety by 50% over the next 6 months. Progress: 30% Interventions: Cognitive Behavioral Therapy  Diagnosis:PTSD, Bi-Polar D/O  Plan: F/U/ appointments scheduled weekly.  Cecile Coder, [facility]                                                                                                                     Cecile Coder, [facility]               Cecile Coder, [facility]               Cecile Coder, [facility]               Cecile Coder, [facility]               Cecile Coder, [facility]               Cecile Coder, [employer] [facility]                Cecile Coder, [facility]               Cecile Coder, [facility]  [facility] Counselor/Ther apist Progress Note  Patient ID: Ameir Faria, MRN: [id number],    Date: 04/01/2024 This session was held via video teletherapy. The patient consented to the video teletherapy  and was located in his home during this session. He is aware it is the responsibility of the patient to secure confidentiality on his end of the session. The provider was in a private home office for the duration of this session.   The patient arrived o n time for her Caregility session  Time Spent: 59 minutes, 2 PM to 2:59 PM  Treatment Type: Individual Therapy Reported Symptoms: anxiety, panic attacks The patient said he jokingly said to his wife that he had not had any pain issues in the cervical area and then a few days ago he started having pain in his neck and shoulders.  He now sits related to degenerative disk and has some consistent pain somewhere in his spine with that b ut jokingly says it just depends on Wednesday which part of his body is decides to show up and hurt.  He is doing the best that he can.  He is still walking which he knows is good for him but does not have right now the strength to do as much in the morning as he has done in the past so he is denying that to 3 times a day for consistency.  He is still doing some positive things and that he is staying in touch with his brother and other  family members.  He is reconnecting with friends from high school and he is which she is enjoying.  One of them has had a similar experience in that she lost her son in the same way the patient lost his daughter.  He said they did not think and Easter in which they set a place for his daughter at the table and put her picture there.  He initially had reservations about doing that he and his wife had made everyone feel as if she was real ly there with him.  He still feels that he is grieving in a healthy way.  He and his wife have always taken a trip around both of their birthdays which are 2 days apart.  That is now close to his daughter died so they have started taking some of her rashes with him and sprinkling them where they go as a way of remembrance.  They are deciding what that looks like this  year in July.  He does contract for safety having no thoughts of hur ting himself or anyone else. We will continue to work on coping skills for helping with his anxiety and the progression of his Lewy body dementia diagnosis as well as processing his grief. Mental Status Exam: Appearance:  Well Groomed  Behavior: Appropriate Motor: Pt. Reports some instability due to Lewey Body dementia Speech/Language:  Normal Rate Affect: Appropriate Mood: normal Thought process: normal Thought content:  W NL Sensory/Perceptual disturbances:  WNL Orientation: oriented to person, place, and situation Attention: Good Concentration: Good Memory: Impaired short term Fund of knowledge:  Good Insight:  Good Judgment:  Good Impulse Control: Good  Risk Assessment: Danger to Self: No Self-injurious Behavior: No Danger to Others: No Duty to Warn:no Physical Aggression / Violence:No  Access to Firearms a concern: No  Lesia Raring ement:No  Treatment plan: Will employ cognitive behavioral therapy as well as grief and loss therapy for relief of anxiety and grief.  Goals of grief  therapy or to have a healthier response to the loss of his daughter and less sadness as evidenced by patient report in therapy notes as well as to help him feel less overwhelmed by her loss.  Goals are to see a 50% reduction in grief symptoms over the next 6 months.  I will encourage the  use of the patient telling his grief story about his daughter including encouraging him to bring pictures and memorabilia to help process his feelings including any feelings of guilt associated with her loss.  We will use cognitive behavioral therapy to help identify and change anxiety producing thoughts and behavior patterns so as to improve his ability to better manage anxiety and stress, and manage thoughts and worrisome thinking con tributing to anxiety.  We will also refresh and encourage dialectical behavior therapy distress tolerance and  mindfulness skills with the intention of reducing anxiety by 50% over the next 6 months. Progress: 30% Interventions: Cognitive Behavioral Therapy  Diagnosis:PTSD, Bi-Polar D/O  Plan: F/U/ appointments scheduled weekly.  Cecile Coder, [facility]                                                                                                                    Cecile Coder, [facility]  [facility] Counselor/Therapist Progress Note  Patient ID: Avion Kutzer, MRN: [id number],    Date: 04/01/2024 This session was held via video teletherapy. The patient consented to the video teletherapy and was  located in his home during this session. He is aware it is the responsibility of the patient to secure confidentiality on his end of the session. The  provider was in a private home office for the duration of this session.   The patient arrived on time for her Caregility session  Time Spent: 58 minutes, 2 PM to 2:58 PM  Treatment Type: Individual Therapy Reported Symptoms: anxiety, panic attacks A fairly difficult week because it was his daughter's birthday.  He said this year was more difficult than last year but he could not pinpoint why.  They did remember her about going  out to a nice restaurant which is something her daughter always wanted to do.  He said they were able to laugh and joke about funny things that his daughter had said or done which is not something they were able to do this time last year so he saw that as healthy grieving.  He knows that the time change threw things off a little bit for everybody and his wife still is not feeling well and his back has been bothering him.  He was not abl e to go to his first round of physical therapy this week because his back was bothering him more.  He has been thinking a lot more about his daughter which he feels comfortable with.  He is thankful that it is getting warmer and staying light longer so he can get back to some of the coping skills such as fishing that he has done in the past.   We will continue to work on coping skills for helping with his anxiety and the progression  of his Lewy body dementia diagnosis as well as processing his grief. Mental Status Exam: Appearance:  Well Groomed  Behavior: Appropriate Motor: Pt. Reports some instability due to Lewey Body dementia Speech/Language:  Normal Rate Affect: Appropriate Mood: normal Thought process: normal Thought content:  WNL Sensory/Perceptual disturbances:  WNL Orientation: oriented to person, place, and situation Attention: Good Conce ntration: Good Memory: Impaired  short term Fund of knowledge:  Good Insight:  Good Judgment:  Good Impulse Control: Good  Risk Assessment: Danger to Self: No Self-injurious Behavior: No Danger to Others: No Duty to Warn:no Physical Aggression / Violence:No  Access to Firearms a concern: No  Gang Involvement:No  Treatment plan: Will employ cognitive behavioral therapy as well as grief and loss therapy for relief of anxie ty and grief.  Goals of grief therapy or to have a healthier response to the loss of his daughter and less sadness as evidenced by patient report in therapy notes as well as to help him feel less overwhelmed by her loss.  Goals are to see a 50% reduction in grief symptoms over the next 6 months.  I will encourage the use of the patient telling his grief story about his daughter including encouraging him to bring pictures and memorabilia  to help process his feelings including any feelings of guilt associated with her loss.  We will use cognitive behavioral therapy to help identify and change anxiety producing thoughts and behavior patterns so as to improve his ability to better manage anxiety and stress, and manage thoughts and worrisome thinking contributing to anxiety.  We will also refresh and encourage dialectical behavior therapy distress  tolerance and mindfulness  skills with the intention of reducing anxiety by 50% over the next 6 months. Progress: 30% Interventions: Cognitive Behavioral Therapy  Diagnosis:PTSD, Bi-Polar D/O  Plan: F/U/ appointments scheduled weekly.  Cecile Coder, [facility]                                                                                                                    Cecile Coder, [facility]               Cecile Coder, [facility]               Cecile Coder,  [facility]  [place] [facility] Counselor/Therapist Progress Note  Patient ID: Devaunte Gasparini, MRN: [id number],    Date: 04/01/2024 This session was held via video teletherapy. The patient consented to the video teletherapy and was located in his home during this session. He is aware it is the responsibility of the patient to secure confidentiality on his end of the session. The provider was in a private home office fo r the duration of this session.   The patient arrived on time for her Caregility session  Time Spent: 1:05 PM until 2 PM, 55 minutes Treatment Type: Individual Therapy Reported Symptoms: anxiety, panic attacks The patient said he came in from his walk this morning and saw what he thought was some sort of tic from his wife.   It was not the first time it happened and he wonders if it might be tardive dyskinesia because of some new  medications that she is on.  She has a call into the doctor right now and hopes to hear something back soon.  They have narrowed down to the fact they think most of her discomfort in breathing is coming from the diaphragm and they are looking for alternatives for treatment.  The patient continues to do those things which help him cope including walking, fishing when he can, spending time with his son and daughter-in-law and grandson.   He also has a way of continued coping with grief he is writing letters to his daughters as a way of expressing his feelings and says that is very beneficial.  He appears to be grieving in a healthy way but he knows that he to year anniversary of her death is coming up and there is some grief there but says it is not overwhelming.  We will continue to work on coping skills for helping with his anxiety and the progression of his Lewy  body dementia diagnosis as well as processing his grief. Mental Status Exam: Appearance:  Well Groomed  Behavior: Appropriate Motor: Pt. Reports some instability due to Lewey Body dementia Speech/Language:  Normal Rate Affect: Appropriate Mood: normal Thought process: normal Thought content:  WNL Sensory/Perceptual disturbances:  WNL Orientation: oriented to person, place, and situation Attention: Good Concentration: Go od Memory: Impaired short term Fund of knowledge:  Good Insight:  Good Judgment:  Good Impulse Control: Good  Risk Assessment: Danger to Self: No Self-injurious Behavior: No Danger to Others: No Duty to Warn:no Physical Aggression / Violence:No  Access to Firearms a concern: No  Gang Involvement:No  Treatment plan: Will employ cognitive behavioral therapy as well as grief and loss therapy for relief of anxiety and grief .  Goals of grief therapy or to have a healthier response to the loss of his  daughter and less sadness as evidenced by patient report in therapy notes as well as to help him feel less overwhelmed by her loss.  Goals are to see a 50% reduction in grief symptoms over the next 6 months.  I will encourage the use of the patient telling his grief story about his daughter including encouraging him to bring pictures and memorabilia to help pro cess his feelings including any feelings of guilt associated with her loss.  We will use cognitive behavioral therapy to help identify and change anxiety producing thoughts and behavior patterns so as to improve his ability to better manage anxiety and stress, and manage thoughts and worrisome thinking contributing to anxiety.  We will also refresh and encourage dialectical behavior therapy distress tolerance and  mindfulness skills with  the intention of reducing anxiety by 50% over the next 6 months. Progress: 30% Interventions: Cognitive Behavioral Therapy  Diagnosis:PTSD, Bi-Polar D/O  Plan: F/U/ appointments scheduled weekly.  Cecile Coder, [facility]                                                                                                                    Cecile Coder, [facility]  [place] [facility] Counselor/Therapist Progress Note  Patient ID:  Kason Benak, MRN: [id number],    Date: 04/01/2024 This session was held via video teletherapy. The patient consented to the video teletherapy and was located in his home during this session. He is aware it is the responsibility of the patient to secure confidentiality on his end of the session. The provider was in a private home office for the duration of this session.   The patient arrived on time for her Caregility session   Time Spent: 59 minutes, 2 PM to 2:59 PM  Treatment Type: Individual Therapy Reported Symptoms: anxiety, panic attacks The patient continues to present with physical pain and both his back and his knee.  He started with a new physical therapist and told him that there was a lot of arthritis in both knees and or certain things he cannot do but they had him do that anyway and now for the past 3 days he has been in significant knee pa in.  He has continued to walk because he knows that is good for him in various ways but says it has been painful.  His wife also was not feeling well but she is going back to the doctor  next week to have an ablation and hopes that will bring her some relief.  For the most part he has been at the last couple weeks reaching back out and hearing from some old friends as well as time with his son and son's family.  He says that his wife's c ousin is coming down for a weekend soon and they enjoy time with her and that he will go to his brother's house sometime in the next few weeks.  He recognizes the need for people that he cares about in helping with his depression. We will continue to work on coping skills for helping with his anxiety and the progression of his Lewy body dementia diagnosis as well as processing his grief. 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 Speech/Language:  Normal Rate Affect: Appropriate Mood: normal Thought process: normal Thought content:  WNL Sensory/Perceptual disturbances:  WNL Orientation: oriented to person, place, and situation Attention: Good Concentration: Good Memory: Impaired short term Fund of knowledge:  Good Insight:  Good Judgment:  Good Impulse Cont rol: Good  Risk Assessment: Danger to Self: No Self-injurious Behavior: No Danger to Others: No Duty to Warn:no Physical Aggression / Violence:No  Access to Firearms a concern: No  Gang Involvement:No  Treatment plan: Will employ cognitive behavioral therapy as well as grief and loss therapy for relief of anxiety and grief.  Goals of grief therapy or to have a healthier response to the loss of his daughter and less sadness as  evidenced by patient report in therapy notes as well as to help him feel less overwhelmed by her loss.  Goals are to see a 50% reduction in grief symptoms over the next 6 months.  I will encourage the use of the patient telling his grief story about his daughter including encouraging him to bring pictures and memorabilia to help process his feelings including any  feelings of guilt associated with her loss.  We will use cognitive behavi oral therapy to help identify and change anxiety producing thoughts and behavior patterns so as to improve his ability to better manage anxiety and stress, and manage thoughts and worrisome thinking contributing to anxiety.  We will  also refresh and encourage dialectical behavior therapy distress tolerance and mindfulness skills with the intention of reducing anxiety by 50% over the next 6 months. Progress: 30% Interventions: Cognitiv e Behavioral Therapy  Diagnosis:PTSD, Bi-Polar D/O  Plan: F/U/ appointments scheduled weekly.  Cecile Coder, [facility]                                                                                                                   Cecile Coder, [facility]               Cecile Coder, [facility]                Cecile Coder, [facility]               Cecile Coder, [facility]  [facility] Counselor/ Therapist Progress Note  Patient ID: Perle Gibbon, MRN: [id number],    Date: 04/01/2024 This session was held via video teletherapy. The patient consented to the video teletherapy and was located in his home during this session. He is aware it is the responsibility of the patient to secure confidentiality on his end of the session. The provider was in a private home office for the duration of this session.   The patient arriv ed on time for her Caregility session  Time Spent: 57 minutes, 2 PM to 2:57 PM  Treatment Type: Individual Therapy Reported Symptoms: anxiety, panic attacks The patient is still having significant back pain.  He cannot sit anywhere for very long and has to be careful where he sits.  There is still some unsteadiness although he did not report any falls over the past 2 weeks.  His biggest concern now is for his wife.  She has been ha ving some difficulty breathing and they did a test so they found a small hole on the back side of her heart.  She is meeting with her cardiologist again next week to see what possible treatment is for her.  He still thinks there may be some issue with her diaphragm but she is meeting with the pulmonologist also.  He is doing what he can to help her.  His daughter's birthday is March 11 so he knows that is a difficult time for both he  and his wife next week.  Typically they do something to remember her but because of the wife's current health issues they cannot go to [place] as they had planned so they are  talking about what to do to remember and celebrate her birthday.  We will continue to work on coping skills for helping with his anxiety and the progression of his Lewy body dementia diagnosis as well as processing his grief. Mental Status Exam: Appearanc e:  Well Groomed  Behavior: Appropriate Motor: Pt. Reports some instability due to Lewey Body dementia Speech/Language:  Normal Rate Affect: Appropriate Mood: normal Thought process: normal Thought content:  WNL Sensory/Perceptual disturbances:  WNL Orientation: oriented to person, place, and situation Attention: Good Concentration: Good Memory: Impaired short term Fund of knowledge:  Good Insight:  Good Judgment:  G ood Impulse Control: Good  Risk Assessment: Danger to Self: No Self-injurious Behavior: No Danger to Others: No Duty to Warn:no Physical Aggression / Violence:No  Access to Firearms a concern: No  Gang Involvement:No  Treatment plan: Will employ cognitive behavioral therapy as well as grief and loss therapy for relief of anxiety and grief.  Goals of grief therapy or to have a healthier response to the loss of his daughter an d less sadness as evidenced by patient report in therapy notes as well as to help him feel less overwhelmed by her loss.  Goals are to see a 50% reduction in grief symptoms over the next 6 months.  I will encourage the use of the patient telling his grief story about his daughter including encouraging him to bring pictures and memorabilia to help process his feelings including any feelings of guilt associated with her loss.  We will use  cognitive behavioral therapy to help identify and change anxiety producing thoughts and behavior patterns so as to improve his ability to better manage anxiety and stress, and manage thoughts and worrisome thinking contributing to anxiety.  We will also refresh and encourage dialectical behavior therapy distress tolerance and mindfulness skills  with the intention  of reducing anxiety by 50% over the next 6 months. Progress: 30% Interv entions: Cognitive Behavioral Therapy  Diagnosis:PTSD, Bi-Polar D/O  Plan: F/U/ appointments scheduled weekly.  Cecile Coder, [facility]                                                                                                                   Cecile Coder, [facility]  [place] [facility] Counselor/Therapist Progress Note  Patient ID: Jadyn Barge, MRN: [id number],    Date: 04/01/2024 This session was held via vide o teletherapy. The patient consented to the video teletherapy and was located in his home during this session.  He is aware it is the responsibility of the patient to secure confidentiality on his end of the session. The provider was in a private home office for the duration of this session.   The patient arrived on time for her Caregility session  Time Spent: 58 minutes, 2 PM to 2:58 PM  Treatment Type: Individual Therapy Reporte d Symptoms: anxiety, panic attacks A fairly difficult week because it was his daughter's birthday.  He said this year was more difficult than last year but he could not pinpoint why.  They did remember her about going out to a nice restaurant which is something her daughter always wanted to do.  He said they were able to laugh and joke about funny things that his daughter had said or done which is not something they were able to do thi s time last year so he saw that as healthy grieving.  He knows that the time change threw things off a little bit for everybody and his wife still is not feeling well and his back has been bothering him.  He was not able to go to his first round of physical therapy this week because his back was bothering him more.  He has been thinking a lot more about his daughter which he feels comfortable with.  He is thankful that it is getting war mer and staying light longer so he can get back to some of the coping skills such as fishing that he has done in the past.   We will continue to work on coping skills for helping with his anxiety and the progression of his Lewy body dementia diagnosis as well as processing his grief. Mental Status Exam: Appearance:  Well Groomed  Behavior: Appropriate Motor: Pt. Reports some instability due to Lewey Body dementia Speech/Langua ge:  Normal Rate Affect: Appropriate Mood: normal Thought process: normal Thought content:  WNL Sensory/Perceptual disturbances:  WNL Orientation: oriented to person, place, and situation Attention: Good Concentration: Good Memory: Impaired short term Fund of knowledge:   Good Insight:  Good Judgment:  Good Impulse Control: Good  Risk Assessment: Danger to Self: No Self-injurious Behavior: No Danger to Others: No D uty to Warn:no Physical Aggression / Violence:No  Access to Firearms a concern: No  Gang Involvement:No  Treatment plan: Will employ cognitive behavioral therapy as well as grief and loss therapy for relief of anxiety and grief.  Goals of grief therapy or to have a healthier response to the loss of his daughter and less sadness as evidenced by patient report in therapy notes as well as to help him feel less overwhelmed by her loss.   Goals are to see a 50% reduction in grief symptoms over the next 6 months.  I will encourage the use of the patient telling his grief story about his daughter including encouraging him to bring pictures and memorabilia to help process his feelings including any feelings of guilt associated with her loss.  We will use cognitive behavioral therapy to help identify and change anxiety producing thoughts and behavior patterns so as to impro ve his ability to better manage anxiety and stress, and manage thoughts and worrisome thinking contributing to anxiety.  We will also refresh and encourage dialectical behavior therapy  distress tolerance and mindfulness skills with the intention of reducing anxiety by 50% over the next 6 months. Progress: 30% Interventions: Cognitive Behavioral Therapy  Diagnosis:PTSD, Bi-Polar D/O  Plan: F/U/ appointments scheduled weekly.  Cra ig Kelly Patient, [facility]                                                                                                                   Cecile Coder, [facility]               Cecile Coder, [facility]               Cecile Coder, [facility]  [place] [facility] Counselor/Therapist Progress Note  Patient ID: Santez Woodcox, MRN: [id number],    Date: 04/01/2024 This session was held via video teletherapy. The patient co nsented to the video teletherapy and was located in his home during this session. He is aware it is the responsibility of the patient to secure confidentiality on his end of the session. The provider was in a private home office for the duration of this session.   The patient arrived on time for her Caregility session  Time Spent: 57 minutes, 2 PM to 2:57 PM  Treatment Type: Individual Therapy Reported Symptoms: anxiety, panic at tacks The patient is still having significant back pain.  He cannot sit anywhere for very long and has to be careful where he sits.  There is still some unsteadiness although he did  not report any falls over the past 2 weeks.  His biggest concern now is for his wife.  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  She is meeting with her cardiologist again ne xt week to see what possible treatment is for her.  He still thinks there may be some issue with her diaphragm but she is meeting with the pulmonologist also.  He is doing what he can to help her.  His daughter's birthday is March 11 so he knows that is a difficult time for both he and his wife next week.  Typically they do something to remember her but because of the wife's current health issues they cannot go to [place] as the y had planned so they are talking about what to do to remember and celebrate her birthday.  We will continue to work on coping skills for helping with his anxiety and the progression of his Lewy body dementia diagnosis as well as processing his grief. Mental Status Exam: Appearance:  Well Groomed  Behavior: Appropriate Motor: Pt. Reports some instability due to Lewey Body dementia Speech/Language:  Normal Rate Affect: Appropri ate Mood: normal Thought process: normal Thought content:  WNL Sensory/Perceptual disturbances:  WNL Orientation: oriented to person, place, and situation Attention: Good Concentration: Good Memory: Impaired short term Fund of knowledge:  Good Insight:  Good Judgment:  Good Impulse Control: Good  Risk Assessment: Danger to Self: No Self-injurious Behavior: No Danger to Others: No Duty to Warn:no Physical Aggressio n / Violence:No  Access to Firearms a concern: No  Gang Involvement:No  Treatment plan: Will employ cognitive behavioral therapy as well as grief and loss therapy for relief of anxiety and grief.  Goals of grief therapy or to have a healthier response to the loss of his daughter and less sadness as evidenced by patient report in therapy notes as well as to help  him feel less overwhelmed by her loss.  Goals are to see a 50% reduction  in grief symptoms over the next 6 months.  I will encourage the use of the patient telling his grief story about his daughter including encouraging him to bring pictures and memorabilia to help process his feelings including any feelings of guilt associated with her loss.  We will use cognitive behavioral therapy to help identify and change anxiety producing thoughts and behavior patterns so as to improve his ability to better manage an xiety and stress, and manage thoughts and worrisome thinking contributing to anxiety.  We will also refresh and encourage dialectical behavior therapy distress tolerance and mindfulness  skills with the intention of reducing anxiety by 50% over the next 6 months. Progress: 30% Interventions: Cognitive Behavioral Therapy  Diagnosis:PTSD, Bi-Polar D/O  Plan: F/U/ appointments scheduled weekly.  Cecile Coder, [facility]                                                                                                                    Cecile Coder, [facility]  [place] [facility] Counselor/Therapist Progress Note  Patient ID: Jamian Andujo, MRN: [id number],    Date: 04/01/2024 This session was held via video teletherapy. The patient consented to the video teletherapy and was located in his home during this session. He is aware it is the responsibility of the  patient to secure confidentiality on his end of the session. The provider was in a private home office for the duration of this session.   The patient arrived on time for her Caregility session  Time Spent: 57 minutes, 2 PM to 2:57 PM  Treatment Type: Individual Therapy Reported Symptoms: anxiety, panic attacks  We will continue to work on coping skills for helping with his anxiety and the progression of his Lewy body dementia  diagnosis as well as processing his grief. Mental Status Exam: Appearance:  Well Groomed  Behavior: Appropriate Motor: Pt. Reports some instability due to Lewey Body dementia Speech/Language:  Normal Rate Affect: Appropriate Mood: normal Thought process: normal Thought content:  WNL Sensory/Perceptual disturbances:  WNL Orientation: oriented to person, place, and situation Attention: Good Concentration: Good Memory: Im paired short term Fund of knowledge:  Good Insight:  Good Judgment:  Good Impulse Control: Good  Risk Assessment: Danger to Self: No Self-injurious Behavior: No Danger to  Others: No Duty to Warn:no Physical Aggression / Violence:No  Access to Firearms a concern: No  Gang Involvement:No  Treatment plan: Will employ cognitive behavioral therapy as well as grief and loss therapy for relief of anxiety and grief.  Goals of gr ief therapy or to have a healthier response to the loss of his daughter and less sadness as evidenced by patient report in therapy notes as well as to help him feel less overwhelmed by her loss.  Goals are to see a 50% reduction in grief symptoms over the next 6 months.  I will encourage the use of the patient telling his grief story about his daughter including encouraging him to bring pictures and memorabilia to help process his feeli ngs including any feelings of guilt associated with her loss.  We will use cognitive behavioral therapy to help identify and change anxiety producing thoughts and behavior patterns so as to improve his ability to better manage anxiety and stress, and manage thoughts and worrisome thinking contributing to anxiety.  We will also refresh and encourage dialectical behavior therapy distress tolerance and mindfulness skills with the intention  of reducing anxiety by 50% over the next 6 months. Progress: 30% Interventions: Cognitive Behavioral Therapy  Diagnosis:PTSD, Bi-Polar D/O  Plan: F/U/ appointments scheduled weekly.  Cecile Coder, [facility]                                                                                                                    Cecile Coder, [facility]               Cecile Coder, [facility]               Cecile Coder, [facility]  Cecile Coder, [facility]               Cecile Coder, [facility]               Cecile Coder, [facility]               Cecile Coder,  [facility]  [place] [facility] Counselor/Therapist Progress Note  Patient ID: Nahsir Venezia, MRN: [id number],    Date: 04/01/2024 This session was held via video teletherapy. The patient consented to the video teletherapy and was  located in his home during this session. He is aware it is the responsibility of the patient to secure confidentiality on his end of the session. The provider was in a private home office for the duration of this session.   The patient arrived on time for her Caregility session  Time Spent: 39 minutes, 2 PM to 2:39 PM  Treatment Type: Individual Therapy Reported Symptoms: anxiety, panic attacks The patient was not feeling well Stephen Myers oday.  He started feeling really earlier in the week having a fever  bad headaches body aches.  He felt a little better yesterday but feels a little bit of a resurgence in headaches and body aches and is running a low-grade fever.  His wife is feeling some of the same symptoms.  For the most part he says he is doing well.  They did increase his memory medication because they were starting to see some increasing decline in short-term memo ry and he said until recently his long-term memory had been good but he was starting to see a difference in how he used to markers to remember what happened at certain times historically.  He feels the medication has helped and has brightened his mood a little also.  He is still trying to walk every day and fish when he can and has enjoyed watching the [employer] as a good distraction.  He reports that he feels he is managi ng mood fairly well.  He has an appointment in 2 weeks.  He does contract for safety having no thoughts of hurting himself or anyone else. We will continue to work on coping skills for helping with his anxiety and the progression of his Lewy body dementia diagnosis as well as processing his grief. Mental Status Exam: Appearance:  Well Groomed  Behavior: Appropriate Motor: Pt. Reports some instability due to Lewey Body dementia  Speech/Language:  Normal Rate Affect: Appropriate Mood: normal Thought process: normal Thought content:  WNL Sensory/Perceptual disturbances:  WNL Orientation: oriented to person, place, and situation Attention: Good Concentration: Good Memory: Impaired short term Fund of knowledge:  Good Insight:  Good Judgment:  Good Impulse Control: Good  Risk Assessment: Danger to Self: No Self-injurious Behavior: No Danger to  Others: No Duty to Warn:no Physical Aggression / Violence:No  Access to Firearms a concern: No  Gang Involvement:No  Treatment plan: Will employ cognitive behavioral therapy as well as grief and loss therapy for relief of anxiety and grief.  Goals  of grief therapy or to have a healthier response to the loss of his daughter and less sadness as evidenced by patient report in therapy notes as well as to help him feel less overwhelmed  by her loss.  Goals are to see a 50% reduction in grief symptoms over the next 6 months.  I will encourage the use of the patient telling his grief story about his daughter including encouraging him to bring pictures and memorabilia to help process his feelings including any feelings of guilt associated with her loss.  We will use cognitive behavioral therapy to help identify and change anxiety producing thoughts and behavior patterns  so as to improve his ability to better manage anxiety and stress, and manage thoughts and worrisome thinking contributing to anxiety.  We will also refresh  and encourage dialectical behavior therapy distress tolerance and mindfulness skills with the intention of reducing anxiety by 50% over the next 6 months. Progress: 30% Interventions: Cognitive Behavioral Therapy  Diagnosis:PTSD, Bi-Polar D/O  Plan: F/U/ appointments scheduled  weekly.  Cecile Coder, [facility]                                                                                                                   Cecile Coder, [facility]  [place] [facility] Counselor/Therapist Progress Note  Patient ID: Dre Gamino, MRN: [id number],    Date: 04/01/2024 This session was held via video teletherapy. The patient consented to the video teletherapy and was located in his home during this sessi on. He is aware it is the responsibility of the patient to secure confidentiality on his end of the session. The provider was in a private home office for the duration of this session.   The patient arrived on time for her Caregility session  Time Spent: 58 minutes, 2 PM to 2:58 PM  Treatment Type: Individual Therapy Reported Symptoms: anxiety, panic attacks A fairly difficult week because it was his daughter's birthday.  He sai d this year was more difficult than last year but he could not pinpoint why.  They did remember her about going out to a nice restaurant which is something her daughter always wanted to  do.  He said they were able to laugh and joke about funny things that his daughter had said or done which is not something they were able to do this time last year so he saw that as healthy grieving.  He knows that the time change threw things off a litt le bit for everybody and his wife still is not feeling well and his back has been bothering him.  He was not able to go to his first round of physical therapy this week because his back was bothering him more.  He has been thinking a lot more about his daughter which he feels comfortable with.  He is thankful that it is getting warmer and staying light longer so he can get back to some of the coping skills such as fishing that he has do ne in the past.   We will continue to work on coping skills for helping with his anxiety and the progression of his Lewy body dementia diagnosis as well as processing his grief. Mental Status Exam: Appearance:  Well Groomed  Behavior: Appropriate Motor: Pt. Reports some instability due to Lewey Body dementia Speech/Language:  Normal Rate Affect: Appropriate Mood: normal Thought process: normal Thought content:  WNL Senso ry/Perceptual disturbances:  WNL Orientation: oriented to person, place, and situation Attention: Good Concentration: Good Memory: Impaired short term Fund of knowledge:  Good Insight:  Good Judgment:  Good Impulse Control: Good  Risk Assessment: Danger to Self: No Self-injurious Behavior: No Danger to Others: No Duty to Warn:no Physical Aggression / Violence:No  Access to Firearms a concern: No  Gang Involvement:No   Treatment plan: Will employ cognitive behavioral therapy as well as grief and loss therapy for relief of anxiety and grief.  Goals of grief therapy or to have a healthier response to the loss of his daughter and less sadness as evidenced by patient report in therapy notes as well as to help him feel less overwhelmed by her loss.  Goals are to see a 50% reduction in  grief symptoms over the next 6 months.  I will encourage the use of th e patient telling his grief story about his daughter including encouraging him to bring pictures and memorabilia to help process his feelings including any feelings of guilt associated with her loss.  We will use cognitive behavioral therapy to help identify and change anxiety producing thoughts and behavior patterns so as to improve his ability to better manage anxiety and stress, and manage thoughts and worrisome thinking contributing  to anxiety.  We will also refresh and encourage dialectical behavior therapy  distress tolerance and mindfulness skills with the intention of reducing anxiety by 50% over the next 6 months. Progress: 30% Interventions: Cognitive Behavioral Therapy  Diagnosis:PTSD, Bi-Polar D/O  Plan: F/U/ appointments scheduled weekly.  Cecile Coder, [facility]                                                                                                                    Cecile Coder, [facility]               Cecile Coder, [facility]               Cecile Coder, [facility]   Behavioral Health Counselor/Therapist Progress Note  Patient ID: Rafay Dahan, MRN: [id number],    Date: 04/01/2024 This session was held via video teletherapy. The patient consented to the video teletherapy and was located in his home during this session. He is aware it is the responsibility  of the patient to secure confidentiality on his end of the session. The provider was in a private home office for the duration of this session.   The patient arrived on time for her Caregility session  Time Spent: 1:05 PM until 2 PM, 55 minutes Treatment Type: Individual Therapy Reported Symptoms: anxiety, panic attacks The patient said he came in from his walk this morning and saw what he thought was some sort of tic from his w ife.  It was not the first time it happened and he wonders if it might be tardive dyskinesia because of some new medications that she is on.  She has a call into the doctor right now and hopes to hear something back soon.  They have narrowed down to the fact they think most of her discomfort in breathing is coming from the diaphragm and they are looking for alternatives for treatment.  The patient continues to do those things which he lp him cope including walking, fishing when he can, spending time with his son and daughter-in-law and grandson.  He also has a way of continued coping with grief he is writing letters  to his daughters as a way of expressing his feelings and says that is very beneficial.  He appears to be grieving in a healthy way but he knows that he to year anniversary of her death is coming up and there is some grief there but says it is not overwhel ming.  We will continue to work on coping skills for helping with his anxiety and the progression of his Lewy body dementia diagnosis as well as processing his grief. Mental Status Exam: Appearance:  Well Groomed  Behavior: Appropriate Motor: Pt. Reports some instability due to Lewey Body dementia Speech/Language:  Normal Rate Affect: Appropriate Mood: normal Thought process: normal Thought content:  WNL Sensory/Perceptua l disturbances:  WNL Orientation: oriented to person, place, and situation Attention: Good Concentration: Good Memory: Impaired short term Fund of knowledge:  Good Insight:  Good Judgment:  Good Impulse Control: Good  Risk Assessment: Danger to Self: No Self-injurious Behavior: No Danger to Others: No Duty to Warn:no Physical Aggression / Violence:No  Access to Firearms a concern: No  Gang Involvement:No  Treatment  plan: Will employ cognitive behavioral therapy as well as grief and loss therapy for relief of anxiety and grief.  Goals of grief therapy or to have a healthier response to the loss of his daughter and less sadness as evidenced by patient report in therapy notes as well as to help him feel less overwhelmed by her loss.  Goals are to see a 50% reduction in grief symptoms over the next 6 months.  I will encourage the use of the patient te lling his grief story about his daughter including encouraging him to bring pictures and memorabilia to help process his feelings including any feelings of guilt associated with her loss.  We will use cognitive behavioral therapy to help identify and change anxiety producing thoughts and behavior patterns so as to improve his ability to better manage anxiety and  stress, and manage thoughts and worrisome thinking contributing to anxiety.   We will also refresh and encourage dialectical behavior therapy distress tolerance  and mindfulness skills with the intention of reducing anxiety by 50% over the next 6 months. Progress: 30% Interventions: Cognitive Behavioral Therapy  Diagnosis:PTSD, Bi-Polar D/O  Plan: F/U/ appointments scheduled weekly.  Cecile Coder, [facility]                                                                                                                    Cecile Coder, [facility]  [place] [facility] Counselor/Therapist Progress Note  Patient  ID: Auren Valdes, MRN: [id number],    Date: 04/01/2024 This session was held via video teletherapy. The patient consented to the video teletherapy and was located in his home during this session. He is aware it is the responsibility of the patient to secure confidentiality on his end of the session. The provider was in a pr ivate home office for the duration of this session.   The patient arrived on time for her Caregility session  Time Spent: 59 minutes, 2 PM to 2:59 PM  Treatment Type: Individual Therapy Reported Symptoms: anxiety, panic attacks The patient continues to present with physical pain and both his back and his knee.  He started with a new physical therapist and told him that there was a lot of arthritis in both knees and or certain th ings he cannot do but they had him do that anyway and now for the past 3 days he has been in significant knee pain.  He has continued to walk because he knows that is good for him in various ways but says it has been painful.  His wife also was not feeling well but she is going back to the doctor next week to have an ablation and hopes that will bring her some relief.  For the most part he has been at the last couple weeks reaching back  out and hearing from some old friends as well as time with his son and son's family.  He says that his wife's cousin is coming down for a weekend soon and they enjoy time with her and that he will go to his brother's house sometime in the next few weeks.  He recognizes the need for people that he cares about in helping with his depression. We will continue to work on coping skills for helping with his anxiety and the progression of hi s Lewy body dementia diagnosis as well as processing his grief. Mental Status Exam: Appearance:  Well Groomed  Behavior: Appropriate Motor: Pt. Reports some instability due to Lewey Body dementia Speech/Language:  Normal Rate Affect: Appropriate Mood: normal Thought  process: normal Thought content:  WNL Sensory/Perceptual disturbances:  WNL Orientation: oriented to person, place, and situation Attention: Good Concentrat ion: Good Memory: Impaired short term Fund of knowledge:  Good Insight:  Good Judgment:  Good Impulse Control: Good  Risk Assessment: Danger to Self: No Self-injurious Behavior: No Danger to Others: No Duty to Warn:no Physical Aggression / Violence:No  Access to Firearms a concern: No  Gang Involvement:No  Treatment plan: Will employ cognitive behavioral therapy as well as grief and loss therapy for relief of anxiety an d grief.  Goals of grief therapy or to have a healthier response to the loss of his daughter and less sadness as evidenced by patient report in therapy notes as well as to help him feel less overwhelmed by her loss.  Goals are to see a 50% reduction in grief symptoms over the next 6 months.  I will encourage the use of the patient telling his grief story about his daughter including encouraging him to bring pictures and memorabilia to h elp process his feelings including any feelings of guilt associated with her loss.  We will use cognitive behavioral therapy to help identify and change anxiety producing thoughts and behavior patterns so as to improve his ability to better manage anxiety and stress, and manage thoughts and worrisome thinking contributing to anxiety.  We will also  refresh and encourage dialectical behavior therapy distress tolerance and mindfulness skil ls with the intention of reducing anxiety by 50% over the next 6 months. Progress: 30% Interventions: Cognitive Behavioral Therapy  Diagnosis:PTSD, Bi-Polar D/O  Plan: F/U/ appointments scheduled weekly.  Cecile Coder, [facility]                                                                                                                     Cecile Coder, [facility]               Cecile Coder, [facility]               Cecile Coder, [facility]               Cecile Coder, [facility]  [place] [facility] Counselor/Therapist Progress Note  Patient ID: Sally Reimers, MRN: [id number],    Date: 04/01/2024 This session was held via video teletherapy. The patient consented to the video teletherapy and was located in his home during this session. He is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 private home office for the duration of this session.   The patient arrived on time for her Caregility session  Time Spent: 1:05 PM until 2 PM, 55 minutes Treatment Type: Individual Therapy Reported Symptoms: anxiety, panic attacks The patient said he came in from his walk this morning and saw what he thought w as some sort of tic from his wife.  It was not the first time it happened and he wonders if it might be tardive dyskinesia because of some new medications that she is on.  She has a  call into the doctor right now and hopes to hear something back soon.  They have narrowed down to the fact they think most of her discomfort in breathing is coming from the diaphragm and they are looking for alternatives for treatment.  The patient continu es to do those things which help him cope including walking, fishing when he can, spending time with his son and daughter-in-law and grandson.  He also has a way of continued coping with grief he is writing letters to his daughters as a way of expressing his feelings and says that is very beneficial.  He appears to be grieving in a healthy way but he knows that he to year anniversary of her death is coming up and there is some grief the re but says it is not overwhelming.  We will continue to work on coping skills for helping with his anxiety and the progression of his Lewy body dementia diagnosis as well as processing his grief. Mental Status Exam: Appearance:  Well Groomed  Behavior: Appropriate Motor: Pt. Reports some instability due to Lewey Body dementia Speech/Language:  Normal Rate Affect: Appropriate Mood: normal Thought process: normal Thought con tent:  WNL Sensory/Perceptual disturbances:  WNL Orientation: oriented to person, place, and situation Attention: Good Concentration: Good Memory: Impaired short term Fund of knowledge:  Good Insight:  Good Judgment:  Good Impulse Control: Good  Risk Assessment: Danger to Self: No Self-injurious Behavior: No Danger to Others: No Duty to Warn:no Physical Aggression / Violence:No  Access to Firearms a concern: No  Ga ng Involvement:No  Treatment plan: Will employ cognitive behavioral therapy as well as grief and loss therapy for relief of anxiety and grief.  Goals of grief therapy or to have a healthier response to the loss of his daughter and less sadness as evidenced by patient report in therapy notes as well as to help him feel less overwhelmed by her loss.  Goals are to  see a 50% reduction in grief symptoms over the next 6 months.  I will encou rage the use of the patient telling his grief story about his daughter including encouraging him to bring pictures and memorabilia to help process his feelings including any feelings of guilt associated with her loss.  We will use cognitive behavioral therapy to help identify and change anxiety producing thoughts and behavior patterns so as to improve his ability to better manage anxiety and stress, and manage thoughts and worrisome thi nking contributing to anxiety.  We will also refresh and encourage dialectical behavior therapy distress tolerance  and mindfulness skills with the intention of reducing anxiety by 50% over the next 6 months. Progress: 30% Interventions: Cognitive Behavioral Therapy  Diagnosis:PTSD, Bi-Polar D/O  Plan: F/U/ appointments scheduled weekly.  Cecile Coder, [facility]                                                                                                                    Cecile Coder, [facility]  [place] [facility] Counselor/Therapist Progress Note  Patient ID: Rodger Giangregorio, MRN: [id number],    Date: 04/08/2024 This session was held via video teletherapy. The patient consented to the video teletherapy and was located in his home during this session. He is aware it is the responsibility of the patient to secure confidentiality on his end of the sess ion. The provider was in a private home office for the duration of this session.   The patient arrived on time for her Caregility session  Time Spent: 59 minutes, 2 PM to 2:59 PM  Treatment Type: Individual Therapy Reported Symptoms: anxiety, panic attacks The patient continues to present with physical pain and both his back and his knee.  He started with a new physical therapist and told him that there was a lot of arthritis in  both knees and or certain things he cannot do but they had him do that anyway and now for the past 3 days he has been in significant knee pain.  He has continued to walk because he knows that is good for him in various ways but says it has been painful.  His wife also was not feeling well but she is going back to the doctor next week to have an ablation and hopes that will bring her some relief.  For the most part he has been at the [place] couple weeks reaching back out and hearing from some old friends as well as time with his son and son's family.  He says that his wife's cousin is coming down for a weekend soon and  they enjoy time with her and that he will go to his brother's house sometime in the next few weeks.  He recognizes the need for people that he cares about in helping with his depression. We will continue to work on coping skills for helping with his anxi ety and the progression of his Lewy body dementia diagnosis as well as processing his grief. Mental Status Exam: Appearance:  Well Groomed  Behavior: Appropriate Motor: Pt. Reports some instability due to Lewey Body dementia Speech/Language:  Normal Rate Affect: Appropriate Mood: normal Thought process: normal Thought content:  WNL Sensory/Perceptual disturbances:  WNL Orientation: oriented to person, place, and situation  Attention: Good Concentration: Good Memory: Impaired short term Fund of knowledge:  Good Insight:  Good Judgment:  Good Impulse Control: Good  Risk Assessment: Danger to Self: No Self-injurious Behavior: No Danger to Others: No Duty to Warn:no Physical Aggression / Violence:No  Access to Firearms a concern: No  Gang Involvement:No  Treatment plan: Will employ cognitive behavioral therapy as well as grief and loss the rapy for relief of anxiety and grief.  Goals of grief therapy or to have a healthier response to the loss of his daughter and less sadness as evidenced by patient report in therapy notes as well as to help him feel less overwhelmed by her loss.  Goals are to see a 50% reduction in grief symptoms over the next 6 months.  I will encourage the use of the patient telling his grief story about his daughter including encouraging him to bring  pictures and memorabilia to help process his feelings including any feelings of guilt associated with her loss.  We will use cognitive behavioral therapy to help identify and change anxiety producing thoughts and behavior patterns so as to improve his ability to better manage anxiety and stress, and manage thoughts and worrisome thinking contributing to anxiety.  We  will also  refresh and encourage dialectical behavior therapy distress Stephen Myers olerance and mindfulness skills with the intention of reducing anxiety by 50% over the next 6 months. Progress: 30% Interventions: Cognitive Behavioral Therapy  Diagnosis:PTSD, Bi-Polar D/O  Plan: F/U/ appointments scheduled weekly.  Cecile Coder, [facility]                                                                                                                    Cecile Coder, [facility]               Cecile Coder, [facility]               Cecile Coder, [facility]               Cecile Coder, [facility]  [place]  Behavioral Health Counselor/Therapist Progress Note  Patient ID: Khallid Pasillas, MRN: [id number],    Date: 04/08/2024 This session was held via video teletherapy. The patient consented to the video teletherapy and was located in his home during this session. He is aware it is the responsibility of the patient to secure c onfidentiality on his end of the session. The provider was in a private home office for the duration of this session.   The patient arrived on time for her Caregility session  Time Spent: 57 minutes, 2 PM to 2:57 PM  Treatment Type: Individual Therapy Reported Symptoms: anxiety, panic attacks The patient is still having significant back pain.  He cannot sit anywhere for very long and has to be careful where he sits.  There is st ill some unsteadiness although he did not report any falls over the past 2 weeks.  His biggest concern now is for his wife.  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  She is meeting with her cardiologist again next week to see what possible treatment is for her.  He still thinks there may be some issue with her diaphragm but she is meeting with the pulmon ologist also.  He is doing what he can to help her.  His daughter's birthday is March 11 so he knows that is a difficult time for both he and his wife next week.  Typically they do something to remember her but because of the wife's current health issues they cannot go to [place] as they had planned so they are talking about what to do to remember and celebrate her birthday.  We will continue to work on coping skills for helpi ng with his anxiety and the progression of his Lewy body dementia diagnosis as well as processing his grief. Mental Status Exam: Appearance:  Well Groomed  Behavior: Appropriate Motor: Pt. Reports some instability due to Lewey Body dementia Speech/Language:  Normal  Rate Affect: Appropriate Mood: normal Thought process: normal Thought content:  WNL Sensory/Perceptual disturbances:  WNL Orientation: oriented to person, plac e, and situation Attention: Good Concentration: Good Memory: Impaired short term Fund of knowledge:  Good Insight:  Good Judgment:  Good Impulse Control: Good  Risk Assessment: Danger to Self: No Self-injurious Behavior: No Danger to Others: No Duty to Warn:no Physical Aggression / Violence:No  Access to Firearms a concern: No  Gang Involvement:No  Treatment plan: Will employ cognitive behavioral therapy as well as gr ief and loss therapy for relief of anxiety and grief.  Goals of grief therapy or to have a healthier response to the loss of his daughter and less sadness as evidenced by patient report in therapy notes as well as to help him feel less overwhelmed by her loss.  Goals are to see a 50% reduction in grief symptoms over the next 6 months.  I will encourage the use of the patient telling his grief story about his daughter including encouragi ng him to bring pictures and memorabilia to help process his feelings including any feelings of guilt associated with her loss.  We will use cognitive behavioral therapy to help identify and change anxiety producing thoughts and behavior patterns so as to improve his ability to better manage anxiety and stress, and manage thoughts and worrisome thinking contributing to anxiety.  We will also refresh and encourage dialectical behavior th erapy distress tolerance and mindfulness  skills with the intention of reducing anxiety by 50% over the next 6 months. Progress: 30% Interventions: Cognitive Behavioral Therapy  Diagnosis:PTSD, Bi-Polar D/O  Plan: F/U/ appointments scheduled weekly.  Cecile Coder, [facility]                                                                                                                     Cecile Coder, [facility]  [place] [facility] Counselor/Therapist Progress Note  Patient ID: Syaire Saber, MRN: [id number],    Date: 04/08/2024 This session was held via video teletherapy. The patient consented to the video teletherapy and was located in his home during this session. He is aware it is the responsibility of the patient to secure confidentiality on his end of the session. The provider was in a private home office for the duration of this session.   The  patient arrived on time for her Caregility session  Time Spent: 58 minutes, 2 PM to 2:58 PM  Treatment Type: Individual Therapy Reported Symptoms: anxiety, panic attacks A fairly  difficult week because it was his daughter's birthday.  He said this year was more difficult than last year but he could not pinpoint why.  They did remember her about going out to a nice restaurant which is something her daughter always wanted to do.  He  said they were able to laugh and joke about funny things that his daughter had said or done which is not something they were able to do this time last year so he saw that as healthy grieving.  He knows that the time change threw things off a little bit for everybody and his wife still is not feeling well and his back has been bothering him.  He was not able to go to his first round of physical therapy this week because his back was both ering him more.  He has been thinking a lot more about his daughter which he feels comfortable with.  He is thankful that it is getting warmer and staying light longer so he can get back to some of the coping skills such as fishing that he has done in the past.   We will continue to work on coping skills for helping with his anxiety and the progression of his Lewy body dementia diagnosis as well as processing his grief. Mental Statu s Exam: Appearance:  Well Groomed  Behavior: Appropriate Motor: Pt. Reports some instability due to Lewey Body dementia Speech/Language:  Normal Rate Affect: Appropriate Mood: normal Thought process: normal Thought content:  WNL Sensory/Perceptual disturbances:  WNL Orientation: oriented to person, place, and situation Attention: Good Concentration: Good Memory: Impaired short term Fund of knowledge:  Good Insight:   Good Judgment:  Good Impulse Control: Good  Risk Assessment: Danger to Self: No Self-injurious Behavior: No Danger to Others: No Duty to Warn:no Physical Aggression / Violence:No  Access to Firearms a concern: No  Gang Involvement:No  Treatment plan: Will employ cognitive behavioral therapy as well as grief and loss therapy for relief of anxiety and  grief.  Goals of grief therapy or to have a healthier response to the loss  of his daughter and less sadness as evidenced by patient report in therapy notes as well as to help him feel less overwhelmed by her loss.  Goals are to see a 50% reduction in grief symptoms over the next 6 months.  I will encourage the use of the patient telling his grief story about his daughter including encouraging him to bring pictures and memorabilia to help process his feelings including any feelings of guilt associated with her  loss.  We will use cognitive behavioral therapy to help identify and change anxiety producing thoughts and behavior patterns so as to improve his ability to better manage anxiety and stress, and manage thoughts and worrisome thinking contributing to anxiety.  We will also refresh and encourage dialectical behavior therapy  distress tolerance and mindfulness skills with the intention of reducing anxiety by 50% over the next 6 months. Pro gress: 30% Interventions: Cognitive Behavioral Therapy  Diagnosis:PTSD, Bi-Polar D/O  Plan: F/U/ appointments scheduled weekly.  Cecile Coder, [facility]                                                                                                                   Cecile Coder, [facility]               Cr aig Kelly Patient, [facility]               Cecile Coder, [facility]  [place] [facility] Counselor/Therapist Progre ss Note  Patient ID: Rodolphe Edmonston, MRN: [id number],    Date: 04/08/2024 This session was held via video teletherapy. The patient consented to the video teletherapy and was located in his home during this session. He is aware it is the responsibility of the patient to secure confidentiality on his end of the session. The provider was in a private home office for the duration of this session.   The patient arrived on time for he r Caregility session  Time Spent: 57 minutes, 2 PM to 2:57 PM  Treatment Type: Individual Therapy Reported Symptoms: anxiety, panic attacks The patient is still having significant back pain.  He cannot sit anywhere for very long and has to be careful where he sits.  There is still some unsteadiness although he did not report any falls over the past 2 weeks.  His biggest concern now is for his wife.  She has been having some difficu lty breathing and they did a test so they found a small hole on the back side of her heart.  She is meeting with her cardiologist again next week to see what possible treatment is for  her.  He still thinks there may be some issue with her diaphragm but she is meeting with the pulmonologist also.  He is doing what he can to help her.  His daughter's birthday is March 11 so he knows that is a difficult time for both he and his wife next  week.  Typically they do something to remember her but because of the wife's current health issues they cannot go to [place] as they had planned so they are talking about what to do to remember and celebrate her birthday.  We will continue to work on coping skills for helping with his anxiety and the progression of his Lewy body dementia diagnosis as well as processing his grief. 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 Speech/Language:  Normal Rate Affect: Appropriate Mood: normal Thought process: normal Thought content:  WNL Sensory/Perceptual disturbances:  WNL Orientation: oriented to person, place, and situation Attention: Good Concentration: Good Memory: Impaired short term Fund of knowledge:  Good Insight:  Good Judgment:  Good Impulse Cont rol: Good  Risk Assessment: Danger to Self: No Self-injurious Behavior: No Danger to Others: No Duty to Warn:no Physical Aggression / Violence:No  Access to Firearms a concern: No  Gang Involvement:No  Treatment plan: Will employ cognitive behavioral therapy as well as grief and loss therapy for relief of anxiety and grief.  Goals of grief therapy or to have a healthier response to the loss of his daughter and less sadness as  evidenced by patient report in therapy notes as well as to help him feel less overwhelmed by her loss.  Goals are to see a 50% reduction in grief symptoms over the next 6 months.  I will encourage the use of the patient telling his grief story about his daughter including encouraging him to bring pictures and memorabilia to help process his feelings including any  feelings of guilt associated with her loss.  We will use cognitive behavi oral therapy to help identify and change anxiety producing thoughts and behavior patterns so as to improve his ability to better manage anxiety and stress, and manage thoughts and worrisome thinking contributing to anxiety.  We will also refresh and encourage dialectical behavior therapy distress tolerance and mindfulness  skills with the intention of reducing anxiety by 50% over the next 6 months. Progress: 30% Interventions: Cognitiv e Behavioral Therapy  Diagnosis:PTSD, Bi-Polar D/O  Plan: F/U/ appointments scheduled weekly.  Cecile Coder, [facility]                                                                                                                   Cecile Coder, [facility]  [place] [facility] Counselor/Therapist Progress Note  Patient ID: Jeffre Enriques, MRN: [id number],    Date: 04/08/2024 This session was held via video teletherapy. The  patient consented to the video teletherapy and was located in his home during this session. He is aware it is the responsibility of the patient to secure confidentiality on his end of the session. The provider was in a private home office for the duration of this session.   The patient arrived on time for her Caregility session  Time Spent: 57 minutes, 2 PM to 2:57 PM  Treatment Type: Individual Therapy Reported Symptoms: anxiet y, panic attacks  We will continue to work on coping skills for helping with his anxiety and the progression of his Lewy body dementia diagnosis as well as processing his grief. Mental Status Exam: Appearance:  Well Groomed  Behavior: Appropriate Motor: Pt. Reports some instability due to Lewey Body dementia Speech/Language:  Normal Rate Affect: Appropriate Mood: normal Thought process: normal Thought content:  WNL Sensor y/Perceptual disturbances:  WNL Orientation: oriented to person, place, and situation Attention: Good Concentration: Good Memory: Impaired short term Fund of knowledge:  Good Insight:  Good Judgment:  Good Impulse Control: Good  Risk Assessment: Danger to Self: No Self-injurious Behavior: No Danger to Others: No Duty to Warn:no Physical Aggression / Violence:No  Access to Firearms a concern: No  Gang Involvement:No   Treatment plan: Will employ cognitive behavioral therapy as well as grief and loss therapy for relief of anxiety and grief.  Goals of grief therapy or to have a healthier response to  the loss of his daughter and less sadness as evidenced by patient report in therapy notes as well as to help him feel less overwhelmed by her loss.  Goals are to see a 50% reduction in grief symptoms over the next 6 months.  I will encourage the use of the  patient telling his grief story about his daughter including encouraging him to bring pictures and memorabilia to help process his feelings including any feelings of guilt associated with her loss.  We will use cognitive behavioral therapy to help identify and change anxiety producing thoughts and behavior patterns so as to improve his ability to better manage anxiety and stress, and manage thoughts and worrisome thinking contributing  to anxiety.  We will also refresh and encourage dialectical behavior therapy distress tolerance and mindfulness skills with the intention of reducing anxiety by 50% over the next 6 months. Progress: 30% Interventions: Cognitive Behavioral Therapy  Diagnosis:PTSD, Bi-Polar D/O  Plan: F/U/ appointments scheduled weekly.  Cecile Coder, [facility]                                                                                                                    Cecile Coder, [facility]               Cecile Coder, [facility]               Cecile Coder, [facility]  Cecile Coder, [facility]               Cecile Coder, [facility]               Cecile Coder, [facility]                Cecile Coder,  [facility]  [place] [facility] Counselor/Therapist Progress Note  Patient ID: Binyamin, Nelis RN: [id number],    Date: 04/08/2024 This session was held via video teletherapy. The patient consented to the video teletherapy and was located in his home during this session. He is aware it is the responsibility of the patient to secure confidentiality on his end of the session. The provider was in a private home office for the duration of this session.   The patient arrived on time for her Caregility session  Time Spent: 39 minu tes, 2 PM to 2:39 PM  Treatment Type: Individual Therapy Reported Symptoms: anxiety, panic attacks The patient was not feeling well today.  He started feeling really earlier in the week having a fever bad  headaches body aches.  He felt a little better yesterday but feels a little bit of a resurgence in headaches and body aches and is running a low-grade fever.  His wife is feeling some of the same symptoms.  For the most part he says  he is doing well.  They did increase his memory medication because they were starting to see some increasing decline in short-term memory and he said until recently his long-term memory had been good but he was starting to see a difference in how he used to markers to remember what happened at certain times historically.  He feels the medication has helped and has brightened his mood a little also.  He is still trying to walk every day  and fish when he can and has enjoyed watching the [employer] as a good distraction.  He reports that he feels he is managing mood fairly well.  He has an appointment in 2 weeks.  He does contract for safety having no thoughts of hurting himself or anyone else. We will continue to work on coping skills for helping with his anxiety and the progression of his Lewy body dementia diagnosis as well as processing his grief. M ental Status Exam: Appearance:  Well Groomed  Behavior: Appropriate Motor: Pt. Reports some instability due to Lewey Body dementia Speech/Language:  Normal Rate Affect: Appropriate Mood: normal Thought process: normal Thought content:  WNL Sensory/Perceptual disturbances:  WNL Orientation: oriented to person, place, and situation Attention: Good Concentration: Good Memory: Impaired short term Fund of knowledge:  Good  Insight:  Good Judgment:  Good Impulse Control: Good  Risk Assessment: Danger to Self: No Self-injurious Behavior: No Danger to Others: No Duty to Warn:no Physical Aggression / Violence:No  Access to Firearms a concern: No  Gang Involvement:No  Treatment plan: Will employ cognitive behavioral therapy as well as grief and loss therapy for relief of anxiety and grief.  Goals of  grief therapy or to have a healthier response Stephen Myers o the loss of his daughter and less sadness as evidenced by patient report in therapy notes as well as to help him feel less overwhelmed by her loss.  Goals are to see a 50% reduction in grief symptoms over the next 6 months.  I will encourage the use of the patient telling his grief story about his daughter including encouraging him to bring pictures and memorabilia to help process his feelings including any feelings of guilt associate d with her loss.  We will use cognitive behavioral therapy to help identify and change anxiety producing thoughts and behavior patterns so as to improve his ability to better manage anxiety and stress, and manage thoughts and worrisome thinking contributing to anxiety.  We will also refresh  and encourage dialectical behavior therapy distress tolerance and mindfulness skills with the intention of reducing anxiety by 50% over the next 6 m onths. Progress: 30% Interventions: Cognitive Behavioral Therapy  Diagnosis:PTSD, Bi-Polar D/O  Plan: F/U/ appointments scheduled weekly.  Cecile Coder, [facility]                                                                                                                   Cecile Coder, [facility]

## 2024-04-15 ENCOUNTER — Encounter: Payer: Self-pay | Admitting: Behavioral Health

## 2024-04-15 ENCOUNTER — Ambulatory Visit (INDEPENDENT_AMBULATORY_CARE_PROVIDER_SITE_OTHER): Admitting: Behavioral Health

## 2024-04-15 DIAGNOSIS — F319 Bipolar disorder, unspecified: Secondary | ICD-10-CM

## 2024-04-15 DIAGNOSIS — F3181 Bipolar II disorder: Secondary | ICD-10-CM

## 2024-04-15 DIAGNOSIS — F4310 Post-traumatic stress disorder, unspecified: Secondary | ICD-10-CM

## 2024-04-15 DIAGNOSIS — F4321 Adjustment disorder with depressed mood: Secondary | ICD-10-CM

## 2024-04-15 NOTE — Progress Notes (Incomplete)
                                                                                                                                                                                                                                                                                                                                                                                                                                                                                                                                                                                                                           Behavioral Health Counselor/Therapist Progress Note  Patient ID: Stephen Myers, MRN: [id number],    Date: 6 /04/2024 This session was held via video teletherapy. The patient consented to the video teletherapy and was located in his home during this session. He is aware it is the responsibility of the patient to secure confidentiality on his end of the session. The provider was in a private home office for the duration of this session.   The patient arrived on time for her Caregility session  Time Spent: 59 minutes, 2 PM to 2:59 PM  Tre atment Type: Individual Therapy Reported Symptoms: anxiety, panic attacks The patient said he jokingly said to his wife that he had not had any pain issues in the cervical area and  then a few days ago he started having pain in his neck and shoulders.  He now sits related to degenerative disk and has some consistent pain somewhere in his spine with that but jokingly says it just depends on Wednesday which part of his body is decides to  show up and hurt.  He is doing the best that he can.  He is still walking which he knows is good for him but does not have right now the strength to do as much in the morning as he has done in the past so he is denying that to 3 times a day for consistency.  He is still doing some positive things and that he is staying in touch with his brother and other family members.  He is reconnecting with friends from high school and he is which  she is enjoying.  One of them has had a similar experience in that she lost her son in the same way the patient lost his daughter.  He said they did not think and Easter in which they set a place for his daughter at the table and put her picture there.  He initially had reservations about doing that he and his wife had made everyone feel as if she was really there with him.  He still feels that he is grieving in a healthy way.  He and h is wife have always taken a trip around both of their birthdays which are 2 days apart.  That is now close to his daughter died so they have started taking some of her rashes with him and sprinkling them where they go as a way of remembrance.  They are deciding what that looks like this year in July.  He does contract for safety having no thoughts of hurting himself or anyone else. We will continue to work on coping skills for helpin g with his anxiety and the progression of his Lewy body dementia diagnosis as well as processing his grief. Mental Status Exam: Appearance:  Well Groomed  Behavior: Appropriate Motor: Pt. Reports some instability due to Lewey Body dementia Speech/Language:  Normal Rate Affect: Appropriate Mood: normal Thought process: normal Thought content:   WNL Sensory/Perceptual disturbances:  WNL Orientation: oriented to person, p lace, and situation Attention: Good Concentration: Good Memory: Impaired short term Fund of knowledge:  Good Insight:  Good Judgment:  Good Impulse Control: Good  Risk Assessment: Danger to Self: No Self-injurious Behavior: No Danger to Others: No Duty to Warn:no Physical Aggression / Violence:No  Access to Firearms a concern: No  Gang Involvement:No  Treatment plan: Will employ cognitive behavioral therapy as well  as grief and loss therapy for relief of anxiety and grief.  Goals of grief  therapy or to have a healthier response to the loss of his daughter and less sadness as evidenced by patient report in therapy notes as well as to help him feel less overwhelmed by her loss.  Goals are to see a 50% reduction in grief symptoms over the next 6 months.  I will encourage the use of the patient telling his grief story about his daughter including enco uraging him to bring pictures and memorabilia to help process his feelings including any feelings of guilt associated with her loss.  We will use cognitive behavioral therapy to help identify and change anxiety producing thoughts and behavior patterns so as to improve his ability to better manage anxiety and stress, and manage thoughts and worrisome thinking contributing to anxiety.  We will also refresh and encourage dialectical behavi or therapy distress tolerance and mindfulness skills with the intention of reducing anxiety by 50% over the next 6 months. Progress: 30% Interventions: Cognitive Behavioral Therapy  Diagnosis:PTSD, Bi-Polar D/O  Plan: F/U/ appointments scheduled weekly.  Lorrene CHRISTELLA Hasten, [facility]                                                                                                                    Lorrene CHRISTELLA Hasten,  [facility]  [facility] Counselor/Therapist Progress Note  Patient ID: Stephen Myers, MRN: [id number],    Date: 04/08/2024 This session was held via video teletherapy. The patient consented to the video teletherapy and was located in his home during this session. He is aware it is the responsibility of the patient to secure confidentiality on his end of the session. The provider was in a private home office for the duration of this session.    The patient arrived on time for her Caregility session  Time Spent: 58 minutes, 2 PM to 2:58 PM  Treatment Type: Individual Therapy Reported Symptoms: anxiety, panic attacks A fairly difficult week because it was his daughter's birthday.  He said this year was more  difficult than last year but he could not pinpoint why.  They did remember her about going out to a nice restaurant which is something her daughter always wanted to do .  He said they were able to laugh and joke about funny things that his daughter had said or done which is not something they were able to do this time last year so he saw that as healthy grieving.  He knows that the time change threw things off a little bit for everybody and his wife still is not feeling well and his back has been bothering him.  He was not able to go to his first round of physical therapy this week because his back wa s bothering him more.  He has been thinking a lot more about his daughter which he feels comfortable with.  He is thankful that it is getting warmer and staying light longer so he can get back to some of the coping skills such as fishing that he has done in the past.   We will continue to work on coping skills for helping with his anxiety and the progression of his Lewy body dementia diagnosis as well as processing his grief. Mental  Status Exam: Appearance:  Well Groomed  Behavior: Appropriate Motor: Pt. Reports some instability due to Lewey Body dementia Speech/Language:  Normal Rate Affect: Appropriate Mood: normal Thought process: normal Thought content:  WNL Sensory/Perceptual disturbances:  WNL Orientation: oriented to person, place, and situation Attention: Good Concentration: Good Memory: Impaired short term Fund of knowledge:  Good I nsight:  Good Judgment:  Good Impulse Control: Good  Risk Assessment: Danger to Self: No Self-injurious Behavior: No Danger to Others: No Duty to Warn:no Physical Aggression / Violence:No  Access to Firearms a concern: No  Gang Involvement:No  Treatment plan: Will employ cognitive behavioral therapy as well as grief and loss therapy for relief of anxiety and grief.  Goals of grief therapy or to have a healthier response  to the loss of  his daughter and less sadness as evidenced by patient report in therapy notes as well as to help him feel less overwhelmed by her loss.  Goals are to see a 50% reduction in grief symptoms over the next 6 months.  I will encourage the use of the patient telling his grief story about his daughter including encouraging him to bring pictures and memorabilia to help process his feelings including any feelings of guilt associat ed with her loss.  We will use cognitive behavioral therapy to help identify and change anxiety producing thoughts and behavior patterns so as to improve his ability to better manage anxiety and stress, and manage thoughts and worrisome thinking contributing to anxiety.  We will also refresh and encourage dialectical behavior therapy  distress tolerance and mindfulness skills with the intention of reducing anxiety by 50% over the next 6  months. Progress: 30% Interventions: Cognitive Behavioral Therapy  Diagnosis:PTSD, Bi-Polar D/O  Plan: F/U/ appointments scheduled weekly.  Lorrene CHRISTELLA Hasten, [facility]                                                                                                                   Lorrene CHRISTELLA Hasten, [facility]                Lorrene CHRISTELLA Hasten, [facility]               Lorrene CHRISTELLA Hasten, [facility]  [place] [facility] Counselor/Therapist Progre ss Note  Patient ID: Stephen Myers, MRN: [id number],    Date: 04/08/2024 This session was held via video teletherapy. The patient consented to the video teletherapy and was located in his home during this session. He is aware it is the responsibility of the patient to secure confidentiality on his end of the session. The provider was in a private home office for the duration of this session.   The patient arrived on time for h er Caregility session  Time Spent: 1:05 PM until 2 PM, 55 minutes Treatment Type: Individual Therapy Reported Symptoms: anxiety, panic attacks The patient said he came in from his walk this morning and saw what he thought was some sort of tic from his wife.  It was not the first time it happened and he wonders if it might be tardive dyskinesia because of some new medications that she is on.  She has a call into the doctor right now  and hopes to hear something back soon.  They have narrowed down to the fact they think most of her discomfort in breathing is coming from the diaphragm and they are looking for  alternatives for treatment.  The patient continues to do those things which help him cope including walking, fishing when he can, spending time with his son and daughter-in-law and grandson.  He also has a way of continued coping with grief he is writing lette rs to his daughters as a way of expressing his feelings and says that is very beneficial.  He appears to be grieving in a healthy way but he knows that he to year anniversary of her death is coming up and there is some grief there but says it is not overwhelming.  We will continue to work on coping skills for helping with his anxiety and the progression of his Lewy body dementia diagnosis as well as processing his grief. Mental Statu s Exam: Appearance:  Well Groomed  Behavior: Appropriate Motor: Pt. Reports some instability due to Lewey Body dementia Speech/Language:  Normal Rate Affect: Appropriate Mood: normal Thought process: normal Thought content:  WNL Sensory/Perceptual disturbances:  WNL Orientation: oriented to person, place, and situation Attention: Good Concentration: Good Memory: Impaired short term Fund of knowledge:  Good Insight :  Good Judgment:  Good Impulse Control: Good  Risk Assessment: Danger to Self: No Self-injurious Behavior: No Danger to Others: No Duty to Warn:no Physical Aggression / Violence:No  Access to Firearms a concern: No  Gang Involvement:No  Treatment plan: Will employ cognitive behavioral therapy as well as grief and loss therapy for relief of anxiety and grief.  Goals of grief therapy or to have a healthier response to the  loss of his daughter and less sadness as evidenced by patient report in therapy notes as well as to help him feel less overwhelmed by her loss.  Goals are to see a 50% reduction in grief symptoms over the next 6 months.  I will encourage the use of the patient telling his grief story about his daughter including encouraging him to bring pictures and  memorabilia to help process his feelings including any feelings of guilt associated wit h her loss.  We will use cognitive behavioral therapy to help identify and change anxiety producing thoughts and behavior patterns so as to improve his ability to better manage anxiety and stress, and manage thoughts and worrisome thinking contributing to anxiety.  We will also refresh and encourage dialectical behavior therapy distress tolerance  and mindfulness skills with the intention of reducing anxiety by 50% over the next 6 months . Progress: 30% Interventions: Cognitive Behavioral Therapy  Diagnosis:PTSD, Bi-Polar D/O  Plan: F/U/ appointments scheduled weekly.  Lorrene CHRISTELLA Hasten, [facility]                                                                                                                   Lorrene CHRISTELLA Hasten, [facility]  [place] [facility] Counselor/Therapist Progress Note  Patient ID: Stephen Myers, MRN: [id number],    Date: 04/08/2024 This se ssion was held via video teletherapy. The patient consented to the video teletherapy and was located in his home during this session. He is aware it is the responsibility of the patient to secure confidentiality on his end of the session. The provider was in a private home office for the duration of this session.   The patient arrived on time for her Caregility session  Time Spent: 59 minutes, 2 PM to 2:59 PM  Treatment Type: Ind ividual Therapy Reported Symptoms: anxiety, panic attacks The patient continues to present with physical pain and both his back and his knee.  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  He has continued to walk because he knows that is good for  him in various ways but says it has been painful.  His wife also was not feeling well but she is going back to the doctor next week to have an ablation and hopes that will bring her some relief.  For the most part he has been at the last couple weeks reaching back out and hearing from some old friends as well as time with his son and son's family.  He says that his wife's cousin is coming down for a weekend soon and they enjoy time with  her and that he will go to his brother's house sometime in the next few weeks.  He recognizes the need for people that he cares about in helping with his depression. We will continue  to work on coping skills for helping with his anxiety and the progression of his Lewy body dementia diagnosis as well as processing his grief. Mental Status Exam: Appearance:  Well Groomed  Behavior: Appropriate Motor: Pt. Reports some instability  due to Lewey Body dementia Speech/Language:  Normal Rate Affect: Appropriate Mood: normal Thought process: normal Thought content:  WNL Sensory/Perceptual disturbances:  WNL Orientation: oriented to person, place, and situation Attention: Good Concentration: Good Memory: Impaired short term Fund of knowledge:  Good Insight:  Good Judgment:  Good Impulse Control: Good  Risk Assessment: Danger to Self: No Self-i njurious Behavior: No Danger to Others: No Duty to Warn:no Physical Aggression / Violence:No  Access to Firearms a concern: No  Gang Involvement:No  Treatment plan: Will employ cognitive behavioral therapy as well as grief and loss therapy for relief of anxiety and grief.  Goals of grief therapy or to have a healthier response to the loss of his daughter and less sadness as evidenced by patient report in therapy notes as well as t o help him feel less overwhelmed by her loss.  Goals are to see a 50% reduction in grief symptoms over the next 6 months.  I will encourage the use of the patient telling his grief story about his daughter including encouraging him to bring pictures and memorabilia to help process his feelings including any feelings of guilt associated with her loss.  We will use cognitive behavioral therapy to help identify and change anxiety producing  thoughts and behavior patterns so as to improve his ability to better manage anxiety and stress, and manage thoughts and worrisome thinking contributing to anxiety.  We will also  refresh and encourage dialectical behavior therapy distress tolerance and mindfulness skills with the intention of reducing anxiety by 50% over the next 6 months. Progress:  30% Interventions: Cognitive Behavioral Therapy  Diagnosis:PTSD, Bi-Polar D/O  Pl an: F/U/ appointments scheduled weekly.  Lorrene CHRISTELLA Hasten, [facility]                                                                                                                   Lorrene CHRISTELLA Hasten, [facility]               Lorrene CHRISTELLA Hasten, [facility]               Lorrene CHRISTELLA Hasten, [facility]                Lorrene CHRISTELLA Hasten, [facility]  [place] [facility] Counselor/Therapist Progress Note  Patient ID: Stephen Myers,  MRN: [id number],    Date: 04/08/2024 This session was held via video teletherapy. The patient  consented to the video teletherapy and was located in his home during this session. He is aware it is the responsibility of the patient to secure confidentiality on his end of the session. The provider was in a private home office for the duration of this session.   The patient arrived on time for her Caregility session  Time Spent: 49 mi nutes, 2 PM to 2:57 PM  Treatment Type: Individual Therapy Reported Symptoms: anxiety, panic attacks The patient is still having significant back pain.  He cannot sit anywhere for very long and has to be careful where he sits.  There is still some unsteadiness although he did not report any falls over the past 2 weeks.  His biggest concern now is for his wife.  She has been having some difficulty breathing and they did a test so they  found a small hole on the back side of her heart.  She is meeting with her cardiologist again next week to see what possible treatment is for her.  He still thinks there may be some issue with her diaphragm but she is meeting with the pulmonologist also.  He is doing what he can to help her.  His daughter's birthday is March 11 so he knows that is a difficult time for both he and his wife next week.  Typically they do something to re member her but because of the wife's current health issues they cannot go to [place] as they had planned so they are talking about what to do to remember and celebrate her birthday.  We will continue to work on coping skills for helping with his anxiety and the progression of his Lewy body dementia diagnosis as well as processing his grief. Mental Status Exam: Appearance:  Well Groomed  Behavior: Appropriate Motor: Pt. R eports some instability due to Lewey Body dementia Speech/Language:  Normal Rate Affect: Appropriate Mood: normal Thought process: normal Thought content:  WNL Sensory/Perceptual disturbances:  WNL Orientation: oriented to person, place, and  situation Attention: Good Concentration: Good Memory: Impaired short term Fund of knowledge:  Good Insight:  Good Judgment:  Good Impulse Control: Good  Risk Assessment: Dange r to Self: No Self-injurious Behavior: No Danger to Others: No Duty to Warn:no Physical Aggression / Violence:No  Access to Firearms a concern: No  Gang Involvement:No  Treatment plan: Will employ cognitive behavioral therapy as well as grief and loss therapy for relief of anxiety and grief.  Goals of grief therapy or to have a healthier response to the loss of his daughter and less sadness as evidenced by patient report in the rapy notes as well as to help him feel less overwhelmed by her loss.  Goals are to see a 50% reduction in grief symptoms over the next 6 months.  I will encourage the use of the patient telling his grief story about his daughter including encouraging him to bring pictures and memorabilia to help process his feelings including any feelings of guilt associated with her loss.  We will use cognitive behavioral therapy to help identify and c hange anxiety producing thoughts and behavior patterns so as to improve his ability to better manage anxiety and stress, and manage thoughts and worrisome thinking contributing to anxiety.  We will also refresh and encourage dialectical behavior therapy distress tolerance and mindfulness  skills with the intention of reducing anxiety by 50% over the next 6 months. Progress: 30% Interventions: Cognitive Behavioral Therapy  Diagnosis:P TSD, Bi-Polar D/O  Plan: F/U/ appointments scheduled weekly.  Lorrene CHRISTELLA Hasten, [facility]                                                                                                                   Lorrene CHRISTELLA Hasten,  [facility]  [place] [facility] Counselor/Therapist Progress Note  Patient ID: Stephen Myers, MRN: [id number],    Date: 04/08/2024 This session was held via video teletherapy. The patient consented to the video tel etherapy and was located in his home during this session. He is aware it is the responsibility of the patient to secure confidentiality on his end of the session. The provider was in a private home office for the duration of this session.   The patient arrived on time for her Caregility session  Time Spent: 58 minutes, 2 PM to 2:58 PM  Treatment Type: Individual Therapy Reported Symptoms: anxiety, panic attacks A fairly difficu lt week because it was his daughter's birthday.  He said this year was more  difficult than last year but he could not pinpoint why.  They did remember her about going out to a nice restaurant which is something her daughter always wanted to do.  He said they were able to laugh and joke about funny things that his daughter had said or done which is not something they were able to do this time last year so he saw that as healthy grieving.   He knows that the time change threw things off a little bit for everybody and his wife still is not feeling well and his back has been bothering him.  He was not able to go to his first round of physical therapy this week because his back was bothering him more.  He has been thinking a lot more about his daughter which he feels comfortable with.  He is thankful that it is getting warmer and staying light longer so he can get back to s ome of the coping skills such as fishing that he has done in the past.   We will continue to work on coping skills for helping with his anxiety and the progression of his Lewy body dementia diagnosis as well as processing his grief. Mental Status Exam: Appearance:  Well Groomed  Behavior: Appropriate Motor: Pt. Reports some instability due to Lewey Body dementia Speech/Language:  Normal Rate Affect: Appropriate Mood: norma l Thought process: normal Thought content:  WNL Sensory/Perceptual disturbances:  WNL Orientation: oriented to person, place, and situation Attention: Good Concentration: Good Memory: Impaired short term Fund of knowledge:  Good Insight:  Good Judgment:  Good Impulse Control: Good  Risk Assessment: Danger to Self: No Self-injurious Behavior: No Danger to Others: No Duty to Warn:no Physical Aggression / Violence :No  Access to Firearms a concern: No  Gang Involvement:No  Treatment plan: Will employ cognitive behavioral therapy as well as grief and loss therapy for relief of anxiety and grief.  Goals of grief therapy or to have a healthier response to the loss of his  daughter and less sadness as evidenced by patient report in therapy notes as well as to help him feel less overwhelmed by her loss.  Goals are to see a 50% reduction in grief sym ptoms over the next 6 months.  I will encourage the use of the patient telling his grief story about his daughter including encouraging him to bring pictures and memorabilia to help process his feelings including any feelings of guilt associated with her loss.  We will use cognitive behavioral therapy to help identify and change anxiety producing thoughts and behavior patterns so as to improve his ability to better manage anxiety and st ress, and manage thoughts and worrisome thinking contributing to anxiety.  We will also refresh and encourage dialectical behavior therapy  distress tolerance and mindfulness skills with the intention of reducing anxiety by 50% over the next 6 months. Progress: 30% Interventions: Cognitive Behavioral Therapy  Diagnosis:PTSD, Bi-Polar D/O  Plan: F/U/ appointments scheduled weekly.  Lorrene CHRISTELLA Hasten, [facility]                                                                                                                    Lorrene CHRISTELLA Hasten, [facility]               Lorrene CHRISTELLA Hasten, [facility]               Lorrene CHRISTELLA Hasten, [facility]   Behavioral Health Counselor/Therapist Progress Note  Patient ID: Stephen Myers, MRN: [id number],    Date: 04/08/2024 This session was held via video teletherapy. The patient consented to the video teletherapy and was locate d in his home during this session. He is aware it is the responsibility of the patient to secure confidentiality on his end of the session. The provider was in a private home office for the duration of this session.   The patient arrived on time for her Caregility session  Time Spent: 57 minutes, 2 PM to 2:57 PM  Treatment Type: Individual Therapy Reported Symptoms: anxiety, panic attacks The patient is still having significant  back pain.  He cannot sit anywhere for very long and has to be careful where he sits.  There is still some unsteadiness although he did not report any falls over the past 2 weeks.  His biggest concern now is for his wife.  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  She is meeting with her cardiologist again next week to see what possible treatment is for  her.  He still thinks there may be some issue with her diaphragm but she is meeting with the pulmonologist also.  He is doing what he can to help her.  His daughter's birthday is  March 11 so he knows that is a difficult time for both he and his wife next week.  Typically they do something to remember her but because of the wife's current health issues they cannot go to [place] as they had planned so they are talking about what t o do to remember and celebrate her birthday.  We will continue to work on coping skills for helping with his anxiety and the progression of his Lewy body dementia diagnosis as well as processing his grief. Mental Status Exam: Appearance:  Well Groomed  Behavior: Appropriate Motor: Pt. Reports some instability due to Lewey Body dementia Speech/Language:  Normal Rate Affect: Appropriate Mood: normal Thought process: normal  Thought content:  WNL Sensory/Perceptual disturbances:  WNL Orientation: oriented to person, place, and situation Attention: Good Concentration: Good Memory: Impaired short term Fund of knowledge:  Good Insight:  Good Judgment:  Good Impulse Control: Good  Risk Assessment: Danger to Self: No Self-injurious Behavior: No Danger to Others: No Duty to Warn:no Physical Aggression / Violence:No  Access to Firearms a c oncern: No  Gang Involvement:No  Treatment plan: Will employ cognitive behavioral therapy as well as grief and loss therapy for relief of anxiety and grief.  Goals of grief therapy or to have a healthier response to the loss of his daughter and less sadness as evidenced by patient report in therapy notes as well as to help him feel less overwhelmed by her loss.  Goals are to see a 50% reduction in grief symptoms over the next 6 months .  I will encourage the use of the patient telling his grief story about his daughter including encouraging him to bring pictures and memorabilia to help process his feelings including any feelings of guilt associated with her loss.  We will use cognitive behavioral therapy to help identify and change anxiety producing thoughts and behavior patterns so as to  improve his ability to better manage anxiety and stress, and manage thoughts an d worrisome thinking contributing to anxiety.  We will also refresh and encourage dialectical behavior therapy distress tolerance and mindfulness  skills with the intention of reducing anxiety by 50% over the next 6 months. Progress: 30% Interventions: Cognitive Behavioral Therapy  Diagnosis:PTSD, Bi-Polar D/O  Plan: F/U/ appointments scheduled weekly.  Lorrene CHRISTELLA Hasten, [facility]                                                                                                                    Lorrene CHRISTELLA Hasten, [facility]  [place] [facility] Counselor/Therapist Progress Note  Patient ID: Stephen Myers, MRN: [id number],    Date: 04/08/2024 This session was held via video teletherapy. The patient consented to the video teletherapy and was located in his home during this session. He is aware it is the responsibility of the patient to secure confidentiality on his  end of the session. The provider was in a private home office for the duration of this session.   The patient arrived on time for her Caregility session  Time Spent: 57 minutes, 2 PM to 2:57 PM  Treatment Type: Individual Therapy Reported Symptoms: anxiety, panic attacks  We will continue to work on coping skills for helping with his anxiety and the progression of his Lewy body dementia diagnosis as well as processing his grie f. Mental Status Exam: Appearance:  Well Groomed  Behavior: Appropriate Motor: Pt. Reports some instability due to Lewey Body dementia Speech/Language:  Normal Rate Affect: Appropriate Mood: normal Thought process: normal Thought content:  WNL Sensory/Perceptual disturbances:  WNL Orientation: oriented to person, place, and situation Attention: Good Concentration: Good Memory: Impaired short term Fund of knowledge :  Good Insight:  Good Judgment:  Good Impulse Control: Good  Risk Assessment: Danger to Self: No Self-injurious Behavior: No Danger to Others: No Duty to Warn:no Physical Aggression / Violence:No  Access to Firearms a concern: No  Gang Involvement:No  Treatment plan: Will employ cognitive behavioral therapy as well as grief and loss therapy for relief of anxiety and grief.  Goals of grief therapy or to have a healthier  response to the loss of his daughter and less sadness as evidenced by patient report in therapy notes as well as to help him feel less overwhelmed by her loss.  Goals are to see a 50% reduction in grief symptoms over the next 6 months.  I will encourage the use of the patient telling  his grief story about his daughter including encouraging him to bring pictures and memorabilia to help process his feelings including any feelings of guil t associated with her loss.  We will use cognitive behavioral therapy to help identify and change anxiety producing thoughts and behavior patterns so as to improve his ability to better manage anxiety and stress, and manage thoughts and worrisome thinking contributing to anxiety.  We will also refresh and encourage dialectical behavior therapy distress tolerance and mindfulness skills with the intention of reducing anxiety by 50% over t he next 6 months. Progress: 30% Interventions: Cognitive Behavioral Therapy  Diagnosis:PTSD, Bi-Polar D/O  Plan: F/U/ appointments scheduled weekly.  Lorrene CHRISTELLA Hasten, [facility]                                                                                                                   Lorrene CHRISTELLA Hasten, [facility]                Lorrene CHRISTELLA Hasten, [facility]               Lorrene CHRISTELLA Hasten, [facility]  Lorrene CHRISTELLA Hasten, [facility]               Lorrene CHRISTELLA Hasten, [facility]               Lorrene CHRISTELLA Hasten, [facility]               Lorrene CHRISTELLA Hasten, [facility]  [place] [facility] Counselor/Therapist Progress Note  Patient ID: Stephen Myers, MRN: [id number],    Date: 04/08/2024 This session was held via video teletherapy. The patient consented to the video teletherapy and was located in his home during this ses sion. He is aware it is the responsibility of the patient to secure confidentiality on his end of the session. The provider was in a private home office for the duration of this session.   The patient arrived on time for her Caregility session  Time Spent: 39 minutes, 2 PM to 2:39 PM  Treatment Type: Individual Therapy Reported Symptoms: anxiety, panic attacks The patient was not feeling well today.  He started feeling really e arlier in the week having a fever bad headaches body aches.  He felt a little better yesterday but feels a little bit of a resurgence in headaches and body aches and is running a low-grade fever.  His wife is feeling some of the same symptoms.  For the most part he says he is doing well.  They did increase his memory medication because they were starting to see some increasing decline in short-term memory and he said until recently his  long-term memory had been good but he was starting to see a difference in how he used to markers to remember what happened at certain times historically.  He feels the medication has  helped and has brightened his mood a little also.  He is still trying to walk every day and fish when he can and has enjoyed watching the [employer] as a good distraction.  He reports that he feels he is managing mood fairly well.  He has an ap pointment in 2 weeks.  He does contract for safety having no thoughts of hurting himself or anyone else. We will continue to work on coping skills for helping with his anxiety and the progression of his Lewy body dementia diagnosis as well as processing his grief. Mental Status Exam: Appearance:  Well Groomed  Behavior: Appropriate Motor: Pt. Reports some instability due to Lewey Body dementia Speech/Language:  Normal Rate  Affect: Appropriate Mood: normal Thought process: normal Thought content:  WNL Sensory/Perceptual disturbances:  WNL Orientation: oriented to person, place, and situation Attention: Good Concentration: Good Memory: Impaired short term Fund of knowledge:  Good Insight:  Good Judgment:  Good Impulse Control: Good  Risk Assessment: Danger to Self: No Self-injurious Behavior: No Danger to Others: No Duty to Warn:no  Physical Aggression / Violence:No  Access to Firearms a concern: No  Gang Involvement:No  Treatment plan: Will employ cognitive behavioral therapy as well as grief and loss therapy for relief of anxiety and grief.  Goals of grief therapy or to have a healthier response to the loss of his daughter and less sadness as evidenced by patient report in therapy notes as well as to help him feel less overwhelmed by her loss.  Goals are to  see a 50% reduction in grief symptoms over the next 6 months.  I will encourage the use of the patient telling his grief story about his daughter including encouraging him to bring pictures and memorabilia to help process his feelings including any feelings of guilt associated with her loss.  We will use cognitive behavioral therapy to help identify and change  anxiety producing thoughts and behavior patterns so as to improve his ability  to better manage anxiety and stress, and manage thoughts and worrisome thinking contributing to anxiety.  We will also refresh  and encourage dialectical behavior therapy distress tolerance and mindfulness skills with the intention of reducing anxiety by 50% over the next 6 months. Progress: 30% Interventions: Cognitive Behavioral Therapy  Diagnosis:PTSD, Bi-Polar D/O  Plan: F/U/ appointments scheduled weekly.  Lorrene CHRISTELLA Hasten, [facility]                                                                                                                   Lorrene CHRISTELLA Hasten, [facility]  [place] [facility] Counselor/Therapist Progress Note  Patient ID: Darran Gabay, MRN: [id number],    Date: 04/08/2024 This session was held via video teletherapy. The patient consented to the video teletherapy and was located in his home during this session. He is aware it is the res ponsibility of the patient to secure confidentiality on his end of the session. The provider was in a private home office for the duration of this session.   The patient arrived on time for her Caregility session  Time Spent: 58 minutes, 2 PM to 2:58 PM  Treatment Type: Individual Therapy Reported Symptoms: anxiety, panic attacks A fairly difficult week because it was his daughter's birthday.  He said this year was more difficu lt than last year but he could not pinpoint why.  They did remember her about going out to a nice restaurant which is something her daughter always wanted to do.  He said they were able to laugh and joke about funny things that his daughter had said or done which is not something they were able to do this time last year so he saw that as healthy grieving.  He knows that the time change threw things off a little bit for everybody and his  wife still is not feeling well and his back has been bothering him.  He was not able to go to his first round of physical therapy this week because his back was bothering him more.  He has been thinking a lot more about his daughter which he feels comfortable with.  He is thankful that it is getting warmer and staying light longer so he can get back to some of the coping skills such as fishing that he has done in the past.   We will  continue to work on coping skills for helping with his anxiety and the progression of his Lewy body dementia diagnosis as well as processing his grief. Mental Status  Exam: Appearance:  Well Groomed  Behavior: Appropriate Motor: Pt. Reports some instability due to Lewey Body dementia Speech/Language:  Normal Rate Affect: Appropriate Mood: normal Thought process: normal Thought content:  WNL Sensory/Perceptual disturbance s:  WNL Orientation: oriented to person, place, and situation Attention: Good Concentration: Good Memory: Impaired short term Fund of knowledge:  Good Insight:  Good Judgment:  Good Impulse Control: Good  Risk Assessment: Danger to Self: No Self-injurious Behavior: No Danger to Others: No Duty to Warn:no Physical Aggression / Violence:No  Access to Firearms a concern: No  Gang Involvement:No  Treatment plan: Will  employ cognitive behavioral therapy as well as grief and loss therapy for relief of anxiety and grief.  Goals of grief therapy or to have a healthier response to the loss of his daughter and less sadness as evidenced by patient report in therapy notes as well as to help him feel less overwhelmed by her loss.  Goals are to see a 50% reduction in grief symptoms over the next 6 months.  I will encourage the use of the patient telling his  grief story about his daughter including encouraging him to bring pictures and memorabilia to help process his feelings including any feelings of guilt associated with her loss.  We will use cognitive behavioral therapy to help identify and change anxiety producing thoughts and behavior patterns so as to improve his ability to better manage anxiety and stress, and manage thoughts and worrisome thinking contributing to anxiety.  We will  also refresh and encourage dialectical behavior therapy distress  tolerance and mindfulness skills with the intention of reducing anxiety by 50% over the next 6 months. Progress: 30% Interventions: Cognitive Behavioral Therapy  Diagnosis:PTSD, Bi-Polar D/O  Plan: F/U/ appointments scheduled weekly.  Lorrene CHRISTELLA Hasten,  [facility]                                                                                                                    Lorrene CHRISTELLA Hasten, [facility]               Lorrene CHRISTELLA Hasten, [facility]               Lorrene CHRISTELLA Hasten, [facility]   Behavioral Health Counselor/Therapist Progress Note  Patient ID: Stephen Myers, MRN: [id number],    Date: 04/08/2024 This session was held via video teletherapy. The patient consented to the video teletherapy and was located in his home during this session. He is aware it is the responsibility of the patient to secur e confidentiality on his end of the session. The provider was in a  private home office for the duration of this session.   The patient arrived on time for her Caregility session  Time Spent: 1:05 PM until 2 PM, 55 minutes Treatment Type: Individual Therapy Reported Symptoms: anxiety, panic attacks The patient said he came in from his walk this morning and saw what he thought was some sort of tic from his wife.  It was not the f irst time it happened and he wonders if it might be tardive dyskinesia because of some new medications that she is on.  She has a call into the doctor right now and hopes to hear something back soon.  They have narrowed down to the fact they think most of her discomfort in breathing is coming from the diaphragm and they are looking for alternatives for treatment.  The patient continues to do those things which help him cope including  walking, fishing when he can, spending time with his son and daughter-in-law and grandson.  He also has a way of continued coping with grief he is writing letters to his daughters as a way of expressing his feelings and says that is very beneficial.  He appears to be grieving in a healthy way but he knows that he to year anniversary of her death is coming up and there is some grief there but says it is not overwhelming.  We will conti nue to work on Pharmacologist for helping with his anxiety and the progression of his Lewy body dementia diagnosis as well as processing his grief. Mental Status Exam: Appearance:  Well Groomed  Behavior: Appropriate Motor: Pt. Reports some instability due to Lewey Body dementia Speech/Language:  Normal Rate Affect: Appropriate Mood: normal Thought process: normal Thought content:  WNL Sensory/Perceptual disturbances:   WNL Orientation: oriented to person, place, and situation Attention: Good Concentration: Good Memory: Impaired short term Fund of knowledge:  Good Insight:  Good Judgment:  Good Impulse Control: Good  Risk Assessment: Danger to Self:  No Self-injurious Behavior: No Danger to Others: No Duty to Warn:no Physical Aggression / Violence:No  Access to Firearms a concern: No  Gang Involvement:No  Treatment plan: Will emplo y cognitive behavioral therapy as well as grief and loss therapy for relief of anxiety and grief.  Goals of grief therapy or to have a healthier response to the loss of his daughter and less sadness as evidenced by patient report in therapy notes as well as to help him feel less overwhelmed by her loss.  Goals are to see a 50% reduction in grief symptoms over the next 6 months.  I will encourage the use of the patient telling his grief  story about his daughter including encouraging him to bring pictures and memorabilia to help process his feelings including any feelings of guilt associated with her loss.  We will use cognitive behavioral therapy to help identify and change anxiety producing thoughts and behavior patterns so as to improve his ability to better manage anxiety and stress, and manage thoughts and worrisome thinking contributing to anxiety.  We will also r efresh and encourage dialectical behavior therapy distress tolerance  and mindfulness skills with the intention of reducing anxiety by 50% over the next 6 months. Progress: 30% Interventions: Cognitive Behavioral Therapy  Diagnosis:PTSD, Bi-Polar D/O  Plan: F/U/ appointments scheduled weekly.  Lorrene CHRISTELLA Hasten, [facility]                                                                                                                    Lorrene CHRISTELLA Hasten,  [facility]  [place] [facility] Counselor/Therapist Progress Note  Patient ID: Stephen Myers, MRN: [id number],    Date: 04/08/2024 This session was held via video teletherapy. The patient consented to the video teletherapy and was located in his home during this session. He is aware it is the responsibility of the patient to secure confidentiality on his end of the session. The provider was in a private home office  for the duration of this session.   The patient arrived on time for her Caregility session  Time Spent: 59 minutes, 2 PM to 2:59 PM  Treatment Type: Individual Therapy Reported Symptoms: anxiety, panic attacks The patient continues to present with physical pain and both his back and his knee.  He  started with a new physical therapist and told him that there was a lot of arthritis in both knees and or certain things he cannot d o but they had him do that anyway and now for the past 3 days he has been in significant knee pain.  He has continued to walk because he knows that is good for him in various ways but says it has been painful.  His wife also was not feeling well but she is going back to the doctor next week to have an ablation and hopes that will bring her some relief.  For the most part he has been at the last couple weeks reaching back out and hearing  from some old friends as well as time with his son and son's family.  He says that his wife's cousin is coming down for a weekend soon and they enjoy time with her and that he will go to his brother's house sometime in the next few weeks.  He recognizes the need for people that he cares about in helping with his depression. We will continue to work on coping skills for helping with his anxiety and the progression of his Lewy body deme ntia diagnosis as well as processing his grief. Mental Status Exam: Appearance:  Well Groomed  Behavior: Appropriate Motor: Pt. Reports some instability due to Lewey Body dementia Speech/Language:  Normal Rate Affect: Appropriate Mood: normal Thought process: normal Thought content:  WNL Sensory/Perceptual disturbances:  WNL Orientation: oriented to person, place, and situation Attention: Good Concentration: Good M emory: Impaired short term Fund of knowledge:  Good Insight:  Good Judgment:  Good Impulse Control: Good  Risk Assessment: Danger to Self: No Self-injurious Behavior: No Danger to Others: No Duty to Warn:no Physical Aggression / Violence:No  Access to Firearms a concern: No  Gang Involvement:No  Treatment plan: Will employ cognitive behavioral therapy as well as grief and loss therapy for relief of anxiety and grief.   Goals of grief therapy or to have a healthier  response to the loss of his daughter and less sadness as evidenced by patient report in therapy notes as well as to help him feel less overwhelmed by her loss.  Goals are to see a 50% reduction in grief symptoms over the next 6 months.  I will encourage the use of the patient telling his grief story about his daughter including encouraging him to bring pictures and memorabilia to help proces s his feelings including any feelings of guilt associated with her loss.  We will use cognitive behavioral therapy to help identify and change anxiety producing thoughts and behavior patterns so as to improve his ability to better manage anxiety and stress, and manage thoughts and worrisome thinking contributing to anxiety.  We will also  refresh and encourage dialectical behavior therapy distress tolerance and mindfulness skills with th e intention of reducing anxiety by 50% over the next 6 months. Progress: 30% Interventions: Cognitive Behavioral Therapy  Diagnosis:PTSD, Bi-Polar D/O  Plan: F/U/ appointments scheduled weekly.  Lorrene CHRISTELLA Hasten, [facility]                                                                                                                    Lorrene CHRISTELLA Hasten, [facility]               Lorrene CHRISTELLA Hasten, [facility] [facility]               Lorrene CHRISTELLA Hasten, [facility]               Lorrene CHRISTELLA Hasten, [facility]  [facility] Counselor/Therapist Progress Note  Patient ID: Declan Mier, MRN: [id number],    Date: 04/08/2024 This session was held via video teletherapy. The patient consented to the video teletherapy and was located in his home during this session. He is aware it is the responsibility of the patient to secure confidentiality on his end of the sessio n. The provider was in a private home office for the duration of this session.   The patient arrived on time for her Caregility session  Time Spent: 57 minutes, 2 PM to 2:57 PM  Treatment Type: Individual Therapy Reported Symptoms: anxiety, panic attacks The patient is still having significant back pain.  He cannot sit anywhere for very long and has to be careful where he sits.  There is still some unsteadiness although he did  not report any falls over the past 2 weeks.  His biggest concern now is for his wife.  She has been having some difficulty breathing and they did a test so they found a small hole on  the back side of her heart.  She is meeting with her cardiologist again next week to see what possible treatment is for her.  He still thinks there may be some issue with her diaphragm but she is meeting with the pulmonologist also.  He is doing what he can  to help her.  His daughter's birthday is March 11 so he knows that is a difficult time for both he and his wife next week.  Typically they do something to remember her but because of the wife's current health issues they cannot go to [place] as they had planned so they are talking about what to do to remember and celebrate her birthday.  We will continue to work on coping skills for helping with his anxiety and the progressio n of his Lewy body dementia diagnosis as well as processing his grief. Mental Status Exam: Appearance:  Well Groomed  Behavior: Appropriate Motor: Pt. Reports some instability due to Lewey Body dementia Speech/Language:  Normal Rate Affect: Appropriate Mood: normal Thought process: normal Thought content:  WNL Sensory/Perceptual disturbances:  WNL Orientation: oriented to person, place, and situation Attention: Good  Concentration: Good Memory: Impaired short term Fund of knowledge:  Good Insight:  Good Judgment:  Good Impulse Control: Good  Risk Assessment: Danger to Self: No Self-injurious Behavior: No Danger to Others: No Duty to Warn:no Physical Aggression / Violence:No  Access to Firearms a concern: No  Gang Involvement:No  Treatment plan: Will employ cognitive behavioral therapy as well as grief and loss therapy for relief  of anxiety and grief.  Goals of grief therapy or to have a healthier response to the loss of his daughter and less sadness as evidenced by patient report in therapy notes as well as to help him feel less overwhelmed by her loss.  Goals are to see a 50% reduction in grief symptoms over the next 6 months.  I will encourage the use of the patient telling his grief  story about his daughter including encouraging him to bring pictures and mem orabilia to help process his feelings including any feelings of guilt associated with her loss.  We will use cognitive behavioral therapy to help identify and change anxiety producing thoughts and behavior patterns so as to improve his ability to better manage anxiety and stress, and manage thoughts and worrisome thinking contributing to anxiety.  We will also refresh and encourage dialectical behavior therapy distress tolerance and min dfulness  skills with the intention of reducing anxiety by 50% over the next 6 months. Progress: 30% Interventions: Cognitive Behavioral Therapy  Diagnosis:PTSD, Bi-Polar D/O  Plan: F/U/ appointments scheduled weekly.  Lorrene CHRISTELLA Hasten, [facility]                                                                                                                    Lorrene CHRISTELLA Hasten, [facility]  [facility] Counselor/Therapist Progress  Note  Patient ID: Stephen Myers, MRN: [id number],    Date: 04/08/2024 This session was held via video teletherapy. The patient consented to the video teletherapy and was located in his home during this session. He is aware it is the responsibility of the patient to secure confidentiality on his end of the session. The provider was in a private home office for the duration of this session.   The patient arrived on time for her  Caregility session  Time Spent: 58 minutes, 2 PM to 2:58 PM  Treatment Type: Individual Therapy Reported Symptoms: anxiety, panic attacks A fairly difficult week because it was his daughter's birthday.  He said this year was more difficult than last year but he could not pinpoint why.  They did remember her about going out to a nice restaurant which is something her daughter always wanted to do.  He said they were able to laugh an d joke about funny things that his daughter had said or done which is not something they were able to do this time last year so he saw that as healthy grieving.  He knows that the time change threw things off a little bit for everybody and his wife still is not feeling well and his back has been bothering him.  He was not able to go to his first round of physical therapy this week because his back was bothering him more.  He has been th inking a lot more about his daughter which he feels comfortable with.  He is thankful that it is getting warmer and staying light longer so he can get back to some of the coping skills  such as fishing that he has done in the past.   We will continue to work on coping skills for helping with his anxiety and the progression of his Lewy body dementia diagnosis as well as processing his grief. Mental Status Exam: Appearance:  Well Groo med  Behavior: Appropriate Motor: Pt. Reports some instability due to Lewey Body dementia Speech/Language:  Normal Rate Affect: Appropriate Mood: normal Thought process: normal Thought content:  WNL Sensory/Perceptual disturbances:  WNL Orientation: oriented to person, place, and situation Attention: Good Concentration: Good Memory: Impaired short term Fund of knowledge:  Good Insight:  Good Judgment:  Good Imp ulse Control: Good  Risk Assessment: Danger to Self: No Self-injurious Behavior: No Danger to Others: No Duty to Warn:no Physical Aggression / Violence:No  Access to Firearms a concern: No  Gang Involvement:No  Treatment plan: Will employ cognitive behavioral therapy as well as grief and loss therapy for relief of anxiety and grief.  Goals of grief therapy or to have a healthier response to the loss of his daughter and less  sadness as evidenced by patient report in therapy notes as well as to help him feel less overwhelmed by her loss.  Goals are to see a 50% reduction in grief symptoms over the next 6 months.  I will encourage the use of the patient telling his grief story about his daughter including encouraging him to bring pictures and memorabilia to help process his feelings including any feelings of guilt associated with her loss.  We will use cognit ive behavioral therapy to help identify and change anxiety producing thoughts and behavior patterns so as to improve his ability to better manage anxiety and stress, and manage thoughts and worrisome thinking contributing to anxiety.  We will also refresh and encourage dialectical behavior therapy distress  tolerance and mindfulness skills with the intention of  reducing anxiety by 50% over the next 6 months. Progress: 30% Interventions : Cognitive Behavioral Therapy  Diagnosis:PTSD, Bi-Polar D/O  Plan: F/U/ appointments scheduled weekly.  Lorrene CHRISTELLA Hasten, [facility]                                                                                                                   Lorrene CHRISTELLA Hasten, [facility]               Lorrene CHRISTELLA Hasten, [facility]                Lorrene CHRISTELLA Hasten, [facility]  [facility] Counselor/Therapist Progress Note  Patient ID: Wi lliam Hershal Eriksson, MRN: [id number],    Date: 04/08/2024 This session was held via video teletherapy. The  patient consented to the video teletherapy and was located in his home during this session. He is aware it is the responsibility of the patient to secure confidentiality on his end of the session. The provider was in a private home office for the duration of this session.   The patient arrived on time for her Caregility session   Time Spent: 57 minutes, 2 PM to 2:57 PM  Treatment Type: Individual Therapy Reported Symptoms: anxiety, panic attacks The patient is still having significant back pain.  He cannot sit anywhere for very long and has to be careful where he sits.  There is still some unsteadiness although he did not report any falls over the past 2 weeks.  His biggest concern now is for his wife.  She has been having some difficulty breathing and they d id a test so they found a small hole on the back side of her heart.  She is meeting with her cardiologist again next week to see what possible treatment is for her.  He still thinks there may be some issue with her diaphragm but she is meeting with the pulmonologist also.  He is doing what he can to help her.  His daughter's birthday is March 11 so he knows that is a difficult time for both he and his wife next week.  Typically they d o something to remember her but because of the wife's current health issues they cannot go to [place] as they had planned so they are talking about what to do to remember and celebrate her birthday.  We will continue to work on coping skills for helping with his anxiety and the progression of his Lewy body dementia diagnosis as well as processing his grief. Mental Status Exam: Appearance:  Well Groomed  Behavior: Appropri ate Motor: Pt. Reports some instability due to Lewey Body dementia Speech/Language:  Normal Rate Affect: Appropriate Mood: normal Thought process: normal Thought content:  WNL Sensory/Perceptual disturbances:  WNL Orientation: oriented to person, place, and  situation Attention: Good Concentration: Good Memory: Impaired short term Fund of knowledge:  Good Insight:  Good Judgment:  Good Impulse Control: Good  Risk A ssessment: Danger to Self: No Self-injurious Behavior: No Danger to Others: No Duty to Warn:no Physical Aggression / Violence:No  Access to Firearms a concern: No  Gang Involvement:No  Treatment plan: Will employ cognitive behavioral therapy as well as grief and loss therapy for relief of anxiety and grief.  Goals of grief therapy or to have a healthier response to the loss of his daughter and less sadness as evidenced by pati ent report in therapy notes as well as to help him feel less overwhelmed by her loss.  Goals are to see a 50% reduction in grief symptoms over the next 6 months.  I will encourage the use of the patient telling his grief story about his daughter including encouraging him to bring pictures and memorabilia to help process his feelings including any feelings of guilt associated with her loss.  We will use cognitive behavioral therapy to he lp identify and change anxiety producing thoughts and behavior patterns so as to improve his ability to better manage anxiety and stress, and manage thoughts and worrisome thinking contributing to anxiety.  We will also refresh and encourage dialectical behavior therapy distress tolerance and mindfulness  skills with the intention of reducing anxiety by 50% over the next 6 months. Progress: 30% Interventions: Cognitive Behavioral Thera py  Diagnosis:PTSD, Bi-Polar D/O  Plan: F/U/ appointments scheduled weekly.  Lorrene CHRISTELLA Hasten, [facility]                                                                                                                   Lorrene CHRISTELLA Hasten, [facility]  [facility] Counselor/Therapist Progress Note  Patient ID: Keats Kingry, MRN: [id number],    Date: 04/08/2024 This session was held via video teletherapy. The patient consented  to the video teletherapy and was located in his home during this session. He is aware it is the responsibility of the patient to secure confidentiality on his end of the session. The provider was in a private home office for the duration of this session.   The patient arrived on time for her Caregility session  Time Spent: 57 minutes, 2 PM to 2:57 PM  Treatment Type: Individual Therapy Reported Symptoms: anxiety, panic attacks   The patient reported that he had not been sleeping well so he did speak with his psychiatrist about adjusting some meds.  Initially they adjusted too much up and he said he felt  groggy for the entire next day but they have found a combination now where he feels like he is getting more rested and only feels a little drowsy shortly after waking up..  There have been a couple times where he stumbled and one of the times he felt down but  did not get hurt the other times he was able to catch himself.  He is trying to stay as active as he can walking daily and fishing with his wife when he can.  His biggest anxiety now is his wife has an upcoming surgery on her diaphragm in 2 weeks so he knows that will be a fairly significant recovery time but also sees the difficulties she is having now and wants her to get some relief..  His son and daughter-in-law will be supports fo r them also.  He continues to speak with friends and family members as encouragement.  He also feels that he is grieving appropriately writing to his daughter on a regular basis about the things that are going on in his and his wife's lives.  We will continue to work on coping skills for helping with his anxiety and the progression of his Lewy body dementia diagnosis as well as processing his grief. Mental Status Exam: Appearance:   Well Groomed  Behavior: Appropriate Motor: Pt. Reports some instability due to Lewey Body dementia Speech/Language:  Normal Rate Affect: Appropriate Mood: normal Thought process: normal Thought content:  WNL Sensory/Perceptual disturbances:  WNL Orientation: oriented to person, place, and situation Attention: Good Concentration: Good Memory: Impaired short term Fund of knowledge:  Good Insight:  Good Judgment:   Good Impulse Control: Good  Risk Assessment: Danger to Self: No Self-injurious Behavior: No Danger to Others: No Duty to Warn:no Physical Aggression / Violence:No  Access to Firearms a concern: No  Gang Involvement:No  Treatment plan: Will employ cognitive behavioral therapy as well as grief and loss therapy for relief of anxiety and grief.  Goals of  grief therapy or to have a healthier response to the loss of his daughter  and less sadness as evidenced by patient report in therapy notes as well as to help him feel less overwhelmed by her loss.  Goals are to see a 50% reduction in grief symptoms over the next 6 months.  I will encourage the use of the patient telling his grief story about his daughter including encouraging him to bring pictures and memorabilia to help process his feelings including any feelings of guilt associated with her loss.  We will u se cognitive behavioral therapy to help identify and change anxiety producing thoughts and behavior patterns so as to improve his ability  to better manage anxiety and stress, and manage thoughts and worrisome thinking contributing to anxiety.  We will also refresh and encourage dialectical behavior therapy distress tolerance and mindfulness skills with the intention of reducing anxiety by 50% over the next 6 months. Progress: 30% Inte rventions: Cognitive Behavioral Therapy  Diagnosis:PTSD, Bi-Polar D/O  Plan: F/U/ appointments scheduled weekly.  Lorrene CHRISTELLA Hasten, [facility]                                                                                                                   Lorrene CHRISTELLA Hasten, [facility]               Lorrene CHRISTELLA Hasten, [facility]               Lorrene CHRISTELLA Hasten, [facility]               Lorrene CHRISTELLA Hasten, [facility]               Lorrene CHRISTELLA Hasten, [facility]               Lorrene CHRISTELLA Hasten, [facility]               Lorrene CHRISTELLA Hasten, [facility]               Lorrene CHRISTELLA Hasten, [facility]  [place] [facility] Counselor/Therapist Progress Note  Patient ID: Baltazar Pekala, MRN: [id number],    Date: 04/08/2024 This session was held via video teletherapy. The patient consented to the video teletherapy and was located in his home du ring this session. He is aware it is the responsibility of the patient to secure confidentiality on his end of the session. The provider was in a private home office for the duration of this session.   The patient arrived on time for her Caregility session  Time Spent: 59 minutes, 2 PM to 2:59 PM  Treatment Type: Individual Therapy Reported Symptoms: anxiety, panic attacks The patient said he jokingly said to his wife that he h ad not had any pain issues in the cervical area and then a few days ago he started having pain in his neck and shoulders.  He now sits related to degenerative disk and has some  consistent pain somewhere in his spine with that but jokingly says it just depends on Wednesday which part of his body is decides to show up and hurt.  He is doing the best that he can.  He is still walking which he knows is good for him but does not have right n ow the strength to do as much in the morning as he has done in the past so he is denying that to 3 times a day for consistency.  He is still doing some positive things and that he is staying in touch with his brother and other family members.  He is reconnecting with friends from high school and he is which she is enjoying.  One of them has had a similar experience in that she lost her son in the same way the patient lost his daughter.   He said they did not think and Easter in which they set a place for his daughter at the table and put her picture there.  He initially had reservations about doing that he and his wife had made everyone feel as if she was really there with him.  He still feels that he is grieving in a healthy way.  He and his wife have always taken a trip around both of their birthdays which are 2 days apart.  That is now close to his daughter died so  they have started taking some of her rashes with him and sprinkling them where they go as a way of remembrance.  They are deciding what that looks like this year in July.  He does contract for safety having no thoughts of hurting himself or anyone else. We will continue to work on coping skills for helping with his anxiety and the progression of his Lewy body dementia diagnosis as well as processing his grief. 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 Speech/Language:  Normal Rate Affect: Appropriate Mood: normal Thought process: normal Thought content:  WNL Sensory/Perceptual disturbances:  WNL Orientation: oriented to person, place, and  situation Attention: Good Concentration: Good Memory: Impaired short term Fund of knowledge:  Good Insight:  Good  Judgment:  Good Impulse Control: Good  Risk Assessment: Danger to Self: No Self-injurious Behavior: No Danger to Others: No Duty to Warn:no Physical Aggression / Violence:No  Access to Firearms a concern: No  Gang Involvement:No  Treatment plan: Will employ cognitive behavioral therapy as well as grief and loss therapy for relief of anxiety and grief.  Goals of grief therapy  or to have a healthier response to the loss of h is daughter and less sadness as evidenced by patient report in therapy notes as well as to help him feel less overwhelmed by her loss.  Goals are to see a 50% reduction in grief symptoms over the next 6 months.  I will encourage the use of the patient telling his grief story about his daughter including encouraging him to bring pictures and memorabilia to help process his feelings including any feelings of guilt associated with her loss .  We will use cognitive behavioral therapy to help identify and change anxiety producing thoughts and behavior patterns so as to improve his ability to better manage anxiety and stress, and manage thoughts and worrisome thinking contributing to anxiety.  We will also refresh and encourage dialectical behavior therapy distress tolerance and mindfulness skills with the intention of reducing anxiety by 50% over the next 6 months. Progres s: 30% Interventions: Cognitive Behavioral Therapy  Diagnosis:PTSD, Bi-Polar D/O  Plan: F/U/ appointments scheduled weekly.  Lorrene CHRISTELLA Hasten, [facility]                                                                                                                   Lorrene CHRISTELLA Hasten, [facility]  [place] [facility] Counselor/Therapist Progress Note  Patient ID: Stephen Myers, MRN: [id number],    Date: 04/08/2024 This session was  held via video teletherapy. The patient consented to the video teletherapy and was located in his home during this session. He is aware it is the responsibility of the patient to secure confidentiality on his end of the session. The provider was in a private home office for the duration of this session.   The patient arrived on time for her Caregility session  Time Spent: 58 minutes, 2 PM to 2:58 PM  Treatment Type: Individual Th erapy Reported Symptoms: anxiety, panic attacks A fairly difficult week because it was his daughter's birthday.  He said this year was more difficult than last year but he could not  pinpoint why.  They did remember her about going out to a nice restaurant which is something her daughter always wanted to do.  He said they were able to laugh and joke about funny things that his daughter had said or done which is not something they were  able to do this time last year so he saw that as healthy grieving.  He knows that the time change threw things off a little bit for everybody and his wife still is not feeling well and his back has been bothering him.  He was not able to go to his first round of physical therapy this week because his back was bothering him more.  He has been thinking a lot more about his daughter which he feels comfortable with.  He is thankful that it  is getting warmer and staying light longer so he can get back to some of the coping skills such as fishing that he has done in the past.   We will continue to work on coping skills for helping with his anxiety and the progression of his Lewy body dementia diagnosis as well as processing his grief. Mental Status Exam: Appearance:  Well Groomed  Behavior: Appropriate Motor: Pt. Reports some instability due to Lewey Body dementi a Speech/Language:  Normal Rate Affect: Appropriate Mood: normal Thought process: normal Thought content:  WNL Sensory/Perceptual disturbances:  WNL Orientation: oriented to person, place, and situation Attention: Good Concentration: Good Memory: Impaired short term Fund of knowledge:  Good Insight:  Good Judgment:  Good Impulse Control: Good  Risk Assessment: Danger to Self: No Self-injurious Behavior: No Dan ger to Others: No Duty to Warn:no Physical Aggression / Violence:No  Access to Firearms a concern: No  Gang Involvement:No  Treatment plan: Will employ cognitive behavioral therapy as well as grief and loss therapy for relief of anxiety and grief.  Goals of grief therapy or to have a healthier response to the loss of his daughter and less sadness as evidenced by  patient report in therapy notes as well as to help him feel less overw helmed by her loss.  Goals are to see a 50% reduction in grief symptoms over the next 6 months.  I will encourage the use of the patient telling his grief story about his daughter including encouraging him to bring pictures and memorabilia to help process his feelings including any feelings of guilt associated with her loss.  We will use cognitive behavioral therapy to help identify and change anxiety producing thoughts and behavior pat terns so as to improve his ability to better manage anxiety and stress, and manage thoughts and worrisome thinking contributing to anxiety.  We will also refresh and encourage dialectical behavior therapy  distress tolerance and mindfulness skills with the intention of reducing anxiety by 50% over the next 6 months. Progress: 30% Interventions: Cognitive Behavioral Therapy  Diagnosis:PTSD, Bi-Polar D/O  Plan: F/U/ appointments sche duled weekly.  Lorrene CHRISTELLA Hasten, [facility]                                                                                                                   Lorrene CHRISTELLA Hasten, [facility]               Lorrene CHRISTELLA Hasten, [facility]               Lorrene CHRISTELLA Hasten, [facility]  [place] [facility] Counselor/Therapist Progress Note  Patient ID: Anquan Azzarello, MRN: [id number],    Date: 04/08/2024 This session was held via video teletherapy. The pat ient consented to the video teletherapy and was located in his home during this session. He is aware it is the responsibility of the patient to secure confidentiality on his end of the session. The provider was in a private home office for the duration of this session.   The patient arrived on time for her Caregility session  Time Spent: 1:05 PM until 2 PM, 55 minutes Treatment Type: Individual Therapy Reported Symptoms: anxiety , panic attacks The patient said he came in from his walk this morning and saw what he thought was some sort of tic from his wife.  It was not the first time it happened and he wonders if it might be tardive dyskinesia because of some new medications that she is on.  She has a call into the doctor right now and hopes to hear something back soon.  They have narrowed down to the fact they think most of her discomfort in breathing is comi ng from the diaphragm and they are looking for alternatives for treatment.  The patient continues to do those things which help him cope including walking, fishing when he can,  spending time with his son and daughter-in-law and grandson.  He also has a way of continued coping with grief he is writing letters to his daughters as a way of expressing his feelings and says that is very beneficial.  He appears to be grieving in a healthy w ay but he knows that he to year anniversary of her death is coming up and there is some grief there but says it is not overwhelming.  We will continue to work on coping skills for helping with his anxiety and the progression of his Lewy body dementia diagnosis as well as processing his grief. Mental Status Exam: Appearance:  Well Groomed  Behavior: Appropriate Motor: Pt. Reports some instability due to Lewey Body dementia Spe ech/Language:  Normal Rate Affect: Appropriate Mood: normal Thought process: normal Thought content:  WNL Sensory/Perceptual disturbances:  WNL Orientation: oriented to person, place, and situation Attention: Good Concentration: Good Memory: Impaired short term Fund of knowledge:  Good Insight:  Good Judgment:  Good Impulse Control: Good  Risk Assessment: Danger to Self: No Self-injurious Behavior: No Danger to  Others: No Duty to Warn:no Physical Aggression / Violence:No  Access to Firearms a concern: No  Gang Involvement:No  Treatment plan: Will employ cognitive behavioral therapy as well as grief and loss therapy for relief of anxiety and grief.  Goals of grief therapy or to have a healthier response to the loss of his daughter and less sadness as evidenced by patient report in therapy notes as well as to help him feel less overwhelmed  by her loss.  Goals are to see a 50% reduction in grief symptoms over the next 6 months.  I will encourage the use of the patient telling his grief story about his daughter including encouraging him to bring pictures and memorabilia to help process his feelings including any feelings of guilt associated with her loss.  We will use cognitive behavioral therapy  to help identify and change anxiety producing thoughts and behavior patterns  so as to improve his ability to better manage anxiety and stress, and manage thoughts and worrisome thinking contributing to anxiety.  We will also refresh and encourage dialectical behavior therapy distress tolerance  and mindfulness skills with the intention of reducing anxiety by 50% over the next 6 months. Progress: 30% Interventions: Cognitive Behavioral Therapy  Diagnosis:PTSD, Bi-Polar D/O  Plan: F/U/ appointments scheduled  weekly.  Lorrene CHRISTELLA Hasten, [facility]                                                                                                                   Lorrene CHRISTELLA Hasten, [facility]  [place] [facility] Counselor/Therapist Progress Note  Patient ID: Coty Larsh, MRN: [id number],    Date: 04/08/2024 This session was held via video teletherapy. The patient consented to the video teletherapy and was located in his home during this sessio n. He is aware it is the responsibility of the patient to secure confidentiality on his end of the session. The provider was in a private home office for the duration of this session.   The patient arrived on time for her Caregility session  Time Spent: 59 minutes, 2 PM to 2:59 PM  Treatment Type: Individual Therapy Reported Symptoms: anxiety, panic attacks The patient continues to present with physical pain and both his back a nd his knee.  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  He has continued to walk because he knows that is good for him in various ways but says it has been painful.  His wife also was not feeling well but she is going back to the doctor next week  to have an ablation and hopes that will bring her some relief.  For the most part he has been at the last couple weeks reaching back out and hearing from some old friends as well as time with his son and son's family.  He says that his wife's cousin is coming down for a weekend soon and they enjoy time with her and that he will go to his brother's house sometime in the next few weeks.  He recognizes the need for people that he cares ab out in helping with his depression. We will continue to work on coping skills for helping with his anxiety and the progression of his Lewy body dementia diagnosis as well as processing  his grief. Mental Status Exam: Appearance:  Well Groomed  Behavior: Appropriate Motor: Pt. Reports some instability due to Lewey Body dementia Speech/Language:  Normal Rate Affect: Appropriate Mood: normal Thought process: normal Thought con tent:  WNL Sensory/Perceptual disturbances:  WNL Orientation: oriented to person, place, and situation Attention: Good Concentration: Good Memory: Impaired short term Fund of knowledge:  Good Insight:  Good Judgment:  Good Impulse Control: Good  Risk Assessment: Danger to Self: No Self-injurious Behavior: No Danger to Others: No Duty to Warn:no Physical Aggression / Violence:No  Access to Firearms a concern: No   Gang Involvement:No  Treatment plan: Will employ cognitive behavioral therapy as well as grief and loss therapy for relief of anxiety and grief.  Goals of grief therapy or to have a healthier response to the loss of his daughter and less sadness as evidenced by patient report in therapy notes as well as to help him feel less overwhelmed by her loss.  Goals are to see a 50% reduction in grief symptoms over the next 6 months.  I will e ncourage the use of the patient telling his grief story about his daughter including encouraging him to bring pictures and memorabilia to help process his feelings including any feelings of guilt associated with her loss.  We will use cognitive behavioral therapy to help identify and change anxiety producing thoughts and behavior patterns so as to improve his ability to better manage anxiety and stress, and manage thoughts and worrisome  thinking contributing to anxiety.  We will also  refresh and encourage dialectical behavior therapy distress tolerance and mindfulness skills with the intention of reducing anxiety by 50% over the next 6 months. Progress: 30% Interventions: Cognitive Behavioral Therapy  Diagnosis:PTSD, Bi-Polar D/O  Plan: F/U/ appointments scheduled weekly.  Lorrene CHRISTELLA Hasten,  [facility]                                                                                                                    Lorrene CHRISTELLA Hasten, [facility]               Lorrene CHRISTELLA Hasten, [facility]               Lorrene CHRISTELLA Hasten, [facility]               Lorrene CHRISTELLA Hasten, [facility]  [place] [facility] Counselor/Therapist Progress Note  Patient ID: Saintclair Schroader, MRN: [id number],    Date: 04/08/2024 This session was held via video teletherapy. The patient consented to the video teletherapy an d was located in his home during this session. He is aware it is the responsibility of the patient to secure  confidentiality on his end of the session. The provider was in a private home office for the duration of this session.   The patient arrived on time for her Caregility session  Time Spent: 57 minutes, 2 PM to 2:57 PM  Treatment Type: Individual Therapy Reported Symptoms: anxiety, panic attacks The patient is still having  significant back pain.  He cannot sit anywhere for very long and has to be careful where he sits.  There is still some unsteadiness although he did not report any falls over the past 2 weeks.  His biggest concern now is for his wife.  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  She is meeting with her cardiologist again next week to see what possible treat ment is for her.  He still thinks there may be some issue with her diaphragm but she is meeting with the pulmonologist also.  He is doing what he can to help her.  His daughter's birthday is March 11 so he knows that is a difficult time for both he and his wife next week.  Typically they do something to remember her but because of the wife's current health issues they cannot go to [place] as they had planned so they are talking  about what to do to remember and celebrate her birthday.  We will continue to work on coping skills for helping with his anxiety and the progression of his Lewy body dementia diagnosis as well as processing his grief. Mental Status Exam: Appearance:  Well Groomed  Behavior: Appropriate Motor: Pt. Reports some instability due to Lewey Body dementia Speech/Language:  Normal Rate Affect: Appropriate Mood: normal Thought proce ss: normal Thought content:  WNL Sensory/Perceptual disturbances:  WNL Orientation: oriented to person, place, and situation Attention: Good Concentration: Good Memory: Impaired short term Fund of knowledge:  Good Insight:  Good Judgment:  Good Impulse Control: Good  Risk  Assessment: Danger to Self: No Self-injurious Behavior: No Danger to Others: No Duty to Warn:no Physical Aggression / Violence:No  Access to  Firearms a concern: No  Gang Involvement:No  Treatment plan: Will employ cognitive behavioral therapy as well as grief and loss therapy for relief of anxiety and grief.  Goals of grief therapy or to have a healthier response to the loss of his daughter and less sadness as evidenced by patient report in therapy notes as well as to help him feel less overwhelmed by her loss.  Goals are to see a 50% reduction in grief symptoms over the n ext 6 months.  I will encourage the use of the patient telling his grief story about his daughter including encouraging him to bring pictures and memorabilia to help process his feelings including any feelings of guilt associated with her loss.  We will use cognitive behavioral therapy to help identify and change anxiety producing thoughts and behavior patterns so as to improve his ability to better manage anxiety and stress, and manage  thoughts and worrisome thinking contributing to anxiety.  We will also refresh and encourage dialectical behavior therapy distress tolerance and mindfulness  skills with the intention of reducing anxiety by 50% over the next 6 months. Progress: 30% Interventions: Cognitive Behavioral Therapy  Diagnosis:PTSD, Bi-Polar D/O  Plan: F/U/ appointments scheduled weekly.  Lorrene CHRISTELLA Hasten, [facility]                                                                                                                    Lorrene CHRISTELLA Hasten, [facility]  [place] [facility] Counselor/Therapist Progress Note  Patient ID: Yonathan Perrow, MRN: [id number],    Date: 04/08/2024 This session was held via video teletherapy. The patient consented to the video teletherapy and was located in his home during this session. He is aware it is the responsibility of the patient to secure confidentia lity on his end of the session. The provider was in a private home office for the duration of this session.   The patient arrived on time for her Caregility session  Time Spent: 58 minutes, 2 PM to 2:58 PM  Treatment Type: Individual Therapy Reported Symptoms: anxiety, panic attacks A fairly difficult week because it was his daughter's birthday.  He said this year was more difficult than last year but he could not pinpoint why.   They did remember her about going out to a nice restaurant which is something her daughter always wanted to do.  He said they were able to laugh and joke about funny things that his  daughter had said or done which is not something they were able to do this time last year so he saw that as healthy grieving.  He knows that the time change threw things off a little bit for everybody and his wife still is not feeling well and his back has  been bothering him.  He was not able to go to his first round of physical therapy this week because his back was bothering him more.  He has been thinking a lot more about his daughter which he feels comfortable with.  He is thankful that it is getting warmer and staying light longer so he can get back to some of the coping skills such as fishing that he has done in the past.   We will continue to work on coping skills for helping w ith his anxiety and the progression of his Lewy body dementia diagnosis as well as processing his grief. Mental Status Exam: Appearance:  Well Groomed  Behavior: Appropriate Motor: Pt. Reports some instability due to Lewey Body dementia Speech/Language:  Normal Rate Affect: Appropriate Mood: normal Thought process: normal Thought content:  WNL Sensory/Perceptual disturbances:  WNL Orientation: oriented to person, plac e, and situation Attention: Good Concentration: Good Memory: Impaired short term Fund of knowledge:  Good Insight:  Good Judgment:  Good Impulse Control: Good  Risk Assessment: Danger to Self: No Self-injurious Behavior: No Danger to Others: No Duty to Warn:no Physical Aggression / Violence:No  Access to Firearms a concern: No  Gang Involvement:No  Treatment plan: Will employ cognitive behavioral therapy as well as  grief and loss therapy for relief of anxiety and grief.  Goals of grief therapy or to have a healthier response to the loss of his daughter and less sadness as evidenced by patient report in therapy notes as well as to help him feel less overwhelmed by her loss.  Goals are to see a 50% reduction in grief symptoms over the next 6 months.  I will encourage the use of  the patient telling his grief story about his daughter including encoura ging him to bring pictures and memorabilia to help process his feelings including any feelings of guilt associated with her loss.  We will use cognitive behavioral therapy to help identify and change anxiety producing thoughts and behavior patterns so as to improve his ability to better manage anxiety and stress, and manage thoughts and worrisome thinking contributing to anxiety.  We will also refresh and encourage dialectical behavior  therapy  distress tolerance and mindfulness skills with the intention of reducing anxiety by 50% over the next 6 months. Progress: 30% Interventions: Cognitive Behavioral Therapy  Diagnosis:PTSD, Bi-Polar D/O  Plan: F/U/ appointments scheduled weekly.  Lorrene CHRISTELLA Hasten, [facility]                                                                                                                    Lorrene CHRISTELLA Hasten, [facility]               Lorrene CHRISTELLA Hasten, [facility]               Lorrene CHRISTELLA Hasten, [facility]   Behavioral Health Counselor/Therapist Progress Note  Patient ID: Laksh Hinners, MRN: [id number],    Date: 04/08/2024 This session was held via video teletherapy. The patient consented to the video teletherapy and was located in his home during this session. He is aware it is the responsibility of the patient to secure confidentiality on his end of the  session. The provider was in a private home office for the duration of this session.   The patient arrived on time for her Caregility session  Time Spent: 57 minutes, 2 PM to 2:57 PM  Treatment Type: Individual Therapy Reported Symptoms: anxiety, panic attacks The patient is still having significant back pain.  He cannot sit anywhere for very long and has to be careful where he sits.  There is still some unsteadiness although h e did not report any falls over the past 2 weeks.  His biggest concern now is for his wife.  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  She is meeting with her cardiologist again next week to see what possible treatment is for her.  He still thinks there may be some issue with her diaphragm but she is meeting with the pulmonologist also.  He is doing what  he can to help her.  His daughter's birthday is March 11 so he knows that is a difficult time for both he and his wife next week.  Typically they do something to remember her but  because of the wife's current health issues they cannot go to [place] as they had planned so they are talking about what to do to remember and celebrate her birthday.  We will continue to work on coping skills for helping with his anxiety and the prog ression of his Lewy body dementia diagnosis as well as processing his grief. Mental Status Exam: Appearance:  Well Groomed  Behavior: Appropriate Motor: Pt. Reports some instability due to Lewey Body dementia Speech/Language:  Normal Rate Affect: Appropriate Mood: normal Thought process: normal Thought content:  WNL Sensory/Perceptual disturbances:  WNL Orientation: oriented to person, place, and situation Attention:  Good Concentration: Good Memory: Impaired short term Fund of knowledge:  Good Insight:  Good Judgment:  Good Impulse Control: Good  Risk Assessment: Danger to Self: No Self-injurious Behavior: No Danger to Others: No Duty to Warn:no Physical Aggression / Violence:No  Access to Firearms a concern: No  Gang Involvement:No  Treatment plan: Will employ cognitive behavioral therapy as well as grief and loss therapy for r elief of anxiety and grief.  Goals of grief therapy or to have a healthier response to the loss of his daughter and less sadness as evidenced by patient report in therapy notes as well as to help him feel less overwhelmed by her loss.  Goals are to see a 50% reduction in grief symptoms over the next 6 months.  I will encourage the use of the patient telling his grief story about his daughter including encouraging him to bring pictures a nd memorabilia to help process his feelings including any feelings of guilt associated with her loss.  We will use cognitive behavioral therapy to help identify and change anxiety producing thoughts and behavior patterns so as to improve his ability to better manage anxiety and stress, and manage thoughts and worrisome thinking contributing to anxiety.  We will  also refresh and encourage dialectical behavior therapy distress tolerance a nd mindfulness  skills with the intention of reducing anxiety by 50% over the next 6 months. Progress: 30% Interventions: Cognitive Behavioral Therapy  Diagnosis:PTSD, Bi-Polar D/O  Plan: F/U/ appointments scheduled weekly.  Lorrene CHRISTELLA Hasten, [facility]                                                                                                                    Lorrene CHRISTELLA Hasten, [facility]  [place] [facility] Counselor/Therapist Pr ogress Note  Patient ID: Deaunte Dente, MRN: [id number],    Date: 04/08/2024 This session was held via  video teletherapy. The patient consented to the video teletherapy and was located in his home during this session. He is aware it is the responsibility of the patient to secure confidentiality on his end of the session. The provider was in a private home office for the duration of this session.   The patient arrived on time f or her Caregility session  Time Spent: 57 minutes, 2 PM to 2:57 PM  Treatment Type: Individual Therapy Reported Symptoms: anxiety, panic attacks  We will continue to work on coping skills for helping with his anxiety and the progression of his Lewy body dementia diagnosis as well as processing his grief. Mental Status Exam: Appearance:  Well Groomed  Behavior: Appropriate Motor: Pt. Reports some instability due to Rodolfo Forehand dy dementia Speech/Language:  Normal Rate Affect: Appropriate Mood: normal Thought process: normal Thought content:  WNL Sensory/Perceptual disturbances:  WNL Orientation: oriented to person, place, and situation Attention: Good Concentration: Good Memory: Impaired short term Fund of knowledge:  Good Insight:  Good Judgment:  Good Impulse Control: Good  Risk Assessment: Danger to Self: No Self-injurious Behavio r: No Danger to Others: No Duty to Warn:no Physical Aggression / Violence:No  Access to Firearms a concern: No  Gang Involvement:No  Treatment plan: Will employ cognitive behavioral therapy as well as grief and loss therapy for relief of anxiety and grief.  Goals of grief therapy or to have a healthier response to the loss of his daughter and less sadness as evidenced by patient report in therapy notes as well as to help him feel  less overwhelmed by her loss.  Goals are to see a 50% reduction in grief symptoms over the next 6 months.  I will encourage the use of the patient telling his grief story about his daughter including encouraging him to bring pictures and memorabilia to help process his feelings including any  feelings of guilt associated with her loss.  We will use cognitive behavioral therapy to help identify and change anxiety producing thoughts and be havior patterns so as to improve his ability to better manage anxiety and stress, and manage thoughts and worrisome thinking contributing to anxiety.  We will also refresh and encourage dialectical behavior therapy distress tolerance and mindfulness skills with the intention of reducing anxiety by 50% over the next 6 months. Progress: 30% Interventions: Cognitive Behavioral Therapy  Diagnosis:PTSD, Bi-Polar D/O  Plan: F/U/ appoint ments scheduled weekly.  Lorrene CHRISTELLA Hasten, [facility]                                                                                                                   Lorrene CHRISTELLA Hasten, [facility]               Lorrene CHRISTELLA Hasten, [facility]               Lorrene CHRISTELLA Hasten, [facility]  [name] P eters, [facility]               [name] Hasten, [facility]               [name] Hasten, [facility] [facility]               [name] Hasten, [facility]  [facility] Counselor/Therapist Progress Note  Patient ID: Laakea Pereira, MRN: [id number],    Date: 04/08/2024 This session was held via video teletherapy. The patient consented to the video teletherapy and was located in his home during this session. He is aware it is the responsibility of the patient to secure confidentiality on his end of the session. The provider was in  a private home office for the duration of this session.   The patient arrived on time for her Caregility session  Time Spent: 39 minutes, 2 PM to 2:39 PM  Treatment Type: Individual Therapy Reported Symptoms: anxiety, panic attacks The patient was not feeling well today.  He started feeling really earlier in the week having a fever bad headaches body aches.  He felt a little better yesterday but feels a little bit of a resurgen ce in headaches and body aches and is running a low-grade fever.  His wife is feeling some of the same symptoms.  For the most part he says he is doing well.  They did increase his memory medication because they were starting to see some increasing decline in short-term memory and he said until recently his long-term memory had been good but he was starting to see a difference in how he used to markers to remember what happened at certa in times historically.  He feels the medication has helped and has brightened his mood a little also.  He is still trying to walk every day and fish when he can and has enjoyed watching  the [employer] as a good distraction.  He reports that he feels he is managing mood fairly well.  He has an appointment in 2 weeks.  He does contract for safety having no thoughts of hurting himself or anyone else. We will continue to wor k on coping skills for helping with his anxiety and the progression of his Lewy body dementia diagnosis as well as processing his grief. Mental Status Exam: Appearance:  Well Groomed  Behavior: Appropriate Motor: Pt. Reports some instability due to Lewey Body dementia Speech/Language:  Normal Rate Affect: Appropriate Mood: normal Thought process: normal Thought content:  WNL Sensory/Perceptual disturbances:  WNL Orien tation: oriented to person, place, and situation Attention: Good Concentration: Good Memory: Impaired short term Fund of knowledge:  Good Insight:  Good Judgment:  Good Impulse Control: Good  Risk Assessment: Danger to Self: No Self-injurious Behavior: No Danger to Others: No Duty to Warn:no Physical Aggression / Violence:No  Access to Firearms a concern: No  Gang Involvement:No  Treatment plan: Will employ cognitiv e behavioral therapy as well as grief and loss therapy for relief of anxiety and grief.  Goals of grief therapy or to have a healthier response to the loss of his daughter and less sadness as evidenced by patient report in therapy notes as well as to help him feel less overwhelmed by her loss.  Goals are to see a 50% reduction in grief symptoms over the next 6 months.  I will encourage the use of the patient telling his grief story abou t his daughter including encouraging him to bring pictures and memorabilia to help process his feelings including any feelings of guilt associated with her loss.  We will use cognitive behavioral therapy to help identify and change anxiety producing thoughts and behavior patterns so as to improve his ability to better manage anxiety and stress, and manage thoughts and  worrisome thinking contributing to anxiety.  We will also refresh and  encourage dialectical behavior therapy distress tolerance and mindfulness skills with the intention of reducing anxiety by 50% over the next 6 months. Progress: 30% Interventions: Cognitive Behavioral Therapy  Diagnosis:PTSD, Bi-Polar D/O  Plan: F/U/ appointments scheduled weekly.  Lorrene CHRISTELLA Hasten, [facility]                                                                                                                    Lorrene CHRISTELLA Hasten, [facility]  [place] [facility] Counselor/Therapist Progress Note  Patient ID: Collen Vincent, MRN:  [id number],    Date: 04/08/2024 This session was held via video teletherapy. The patient consented to the video teletherapy and was located in his home during this session. He is aware it is the responsibility of the patient to secure confidentiality on his end of the session. The provider was in a private home office for the d uration of this session.   The patient arrived on time for her Caregility session  Time Spent: 58 minutes, 2 PM to 2:58 PM  Treatment Type: Individual Therapy Reported Symptoms: anxiety, panic attacks A fairly difficult week because it was his daughter's birthday.  He said this year was more difficult than last year but he could not pinpoint why.  They did remember her about going out to a nice restaurant which is something her  daughter always wanted to do.  He said they were able to laugh and joke about funny things that his daughter had said or done which is not something they were able to do this time last year so he saw that as healthy grieving.  He knows that the time change threw things off a little bit for everybody and his wife still is not feeling well and his back has been bothering him.  He was not able to go to his first round of physical therapy  this week because his back was bothering him more.  He has been thinking a lot more about his daughter which he feels comfortable with.  He is thankful that it is getting warmer and staying light longer so he can get back to some of the coping skills such as fishing that he has done in the past.   We will continue to work on coping skills for helping with his anxiety and the progression of his Lewy body dementia diagnosis as well as  processing his grief. Mental Status Exam: Appearance:  Well Groomed  Behavior: Appropriate Motor: Pt. Reports some instability due to Lewey Body dementia Speech/Language:  Normal Rate Affect: Appropriate Mood: normal Thought process: normal Thought content:   WNL Sensory/Perceptual disturbances:  WNL Orientation: oriented to person, place, and situation Attention: Good Concentration: Good Memory: Impaired short term  Fund of knowledge:  Good Insight:  Good Judgment:  Good Impulse Control: Good  Risk Assessment: Danger to Self: No Self-injurious Behavior: No Danger to Others: No Duty to Warn:no Physical Aggression / Violence:No  Access to Firearms a concern: No  Gang Involvement:No  Treatment plan: Will employ cognitive behavioral therapy as well as grief and loss therapy for relief of anxiety and grief.  Goals of grief therapy or  to have a healthier response to the loss of his daughter and less sadness as evidenced by patient report in therapy notes as well as to help him feel less overwhelmed by her loss.  Goals are to see a 50% reduction in grief symptoms over the next 6 months.  I will encourage the use of the patient telling his grief story about his daughter including encouraging him to bring pictures and memorabilia to help process his feelings including a ny feelings of guilt associated with her loss.  We will use cognitive behavioral therapy to help identify and change anxiety producing thoughts and behavior patterns so as to improve his ability to better manage anxiety and stress, and manage thoughts and worrisome thinking contributing to anxiety.  We will also refresh and encourage dialectical behavior therapy distress  tolerance and mindfulness skills with the intention of reducing an xiety by 50% over the next 6 months. Progress: 30% Interventions: Cognitive Behavioral Therapy  Diagnosis:PTSD, Bi-Polar D/O  Plan: F/U/ appointments scheduled weekly.  Lorrene CHRISTELLA Hasten, [facility]                                                                                                                   [address],  [facility]               Lorrene CHRISTELLA Hasten, [facility]               Lorrene CHRISTELLA Hasten, [facility]  [place] [facility] Counselor/Therapist Progress Note  Patient ID: Wladyslaw Henrichs, MRN: [id number],    Date: 04/08/2024 This session was held via video teletherapy. The patient consented to the video teletherapy and was located in his home during this session. He is aware it is the responsibility of the patient to secure confidentiality on his end of the session. The provider was in a private home office for the duration of this session.   The  patient arrived on time for her Caregility session  Time Spent: 1:05 PM until 2 PM, 55 minutes Treatment Type: Individual Therapy Reported Symptoms: anxiety, panic attacks The patient  said he came in from his walk this morning and saw what he thought was some sort of tic from his wife.  It was not the first time it happened and he wonders if it might be tardive dyskinesia because of some new medications that she is on.  She has a c all into the doctor right now and hopes to hear something back soon.  They have narrowed down to the fact they think most of her discomfort in breathing is coming from the diaphragm and they are looking for alternatives for treatment.  The patient continues to do those things which help him cope including walking, fishing when he can, spending time with his son and daughter-in-law and grandson.  He also has a way of continued coping w ith grief he is writing letters to his daughters as a way of expressing his feelings and says that is very beneficial.  He appears to be grieving in a healthy way but he knows that he to year anniversary of her death is coming up and there is some grief there but says it is not overwhelming.  We will continue to work on coping skills for helping with his anxiety and the progression of his Lewy body dementia diagnosis as well as proces sing his grief. Mental Status Exam: Appearance:  Well Groomed  Behavior: Appropriate Motor: Pt. Reports some instability due to Lewey Body dementia Speech/Language:  Normal Rate Affect: Appropriate Mood: normal Thought process: normal Thought content:  WNL Sensory/Perceptual disturbances:  WNL Orientation: oriented to person, place, and situation Attention: Good Concentration: Good Memory: Impaired short term Fund  of knowledge:  Good Insight:  Good Judgment:  Good Impulse Control: Good  Risk Assessment: Danger to Self: No Self-injurious Behavior: No Danger to Others: No Duty to Warn:no Physical Aggression / Violence:No  Access to Firearms a concern: No  Gang Involvement:No  Treatment plan: Will employ cognitive behavioral therapy as well as grief and loss therapy  for relief of anxiety and grief.  Goals of grief therapy or to hav e a healthier response to the loss of his daughter and less sadness as evidenced by patient report in therapy notes as well as to help him feel less overwhelmed by her loss.  Goals are to see a 50% reduction in grief symptoms over the next 6 months.  I will encourage the use of the patient telling his grief story about his daughter including encouraging him to bring pictures and memorabilia to help process his feelings including any fee lings of guilt associated with her loss.  We will use cognitive behavioral therapy to help identify and change anxiety producing thoughts and behavior patterns so as to improve his ability to better manage anxiety and stress, and manage thoughts and worrisome thinking contributing to anxiety.  We will also refresh and encourage dialectical behavior therapy distress tolerance  and mindfulness skills with the intention of reducing anxiety  by 50% over the next 6 months. Progress: 30% Interventions: Cognitive Behavioral Therapy  Diagnosis:PTSD, Bi-Polar D/O  Plan: F/U/ appointments scheduled weekly.  Lorrene CHRISTELLA Hasten, [facility]                                                                                                                   Lorrene CHRISTELLA Hasten, [facility]  [place] [facility] Counselor/Therapist Progress Note  Patient ID: Stephen Myers, MRN: [id number],     Date: 04/08/2024 This session was held via video teletherapy. The patient consented to the video teletherapy and was located in his home during this session. He is aware it is the responsibility of the patient to secure confidentiality on his end of the session. The provider was in a private home office for the duration of this session.   The patient arrived on time for her Caregility session  Time Spent: 59 minutes, 2 PM to  2:59 PM  Treatment Type: Individual Therapy Reported Symptoms: anxiety, panic attacks The patient continues to present with physical pain and both his back and his knee.  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  He has continued to walk becau se he knows that is good for him in various ways but says it has been painful.  His wife also was not feeling well but she is going back to the doctor next week to have an ablation and  hopes that will bring her some relief.  For the most part he has been at the last couple weeks reaching back out and hearing from some old friends as well as time with his son and son's family.  He says that his wife's cousin is coming down for a weekend  soon and they enjoy time with her and that he will go to his brother's house sometime in the next few weeks.  He recognizes the need for people that he cares about in helping with his depression. We will continue to work on coping skills for helping with his anxiety and the progression of his Lewy body dementia diagnosis as well as processing his grief. Mental Status Exam: Appearance:  Well Groomed  Behavior: Appropriate Motor:  Pt. Reports some instability due to Lewey Body dementia Speech/Language:  Normal Rate Affect: Appropriate Mood: normal Thought process: normal Thought content:  WNL Sensory/Perceptual disturbances:  WNL Orientation: oriented to person, place, and situation Attention: Good Concentration: Good Memory: Impaired short term Fund of knowledge:  Good Insight:  Good Judgment:  Good Impulse Control: Good  Risk Assessment:  Danger to Self: No Self-injurious Behavior: No Danger to Others: No Duty to Warn:no Physical Aggression / Violence:No  Access to Firearms a concern: No  Gang Involvement:No  Treatment plan: Will employ cognitive behavioral therapy as well as grief and loss therapy for relief of anxiety and grief.  Goals of grief therapy or to have a healthier response to the loss of his daughter and less sadness as evidenced by patient report  in therapy notes as well as to help him feel less overwhelmed by her loss.  Goals are to see a 50% reduction in grief symptoms over the next 6 months.  I will encourage the use of the patient telling his grief story about his daughter including encouraging him to bring pictures and memorabilia to help process his feelings including any feelings of guilt associated  with her loss.  We will use cognitive behavioral therapy to help identify  and change anxiety producing thoughts and behavior patterns so as to improve his ability to better manage anxiety and stress, and manage thoughts and worrisome thinking contributing to anxiety.  We will  also refresh and encourage dialectical behavior therapy distress tolerance and mindfulness skills with the intention of reducing anxiety by 50% over the next 6 months. Progress: 30% Interventions: Cognitive Behavioral Therapy  Diagn osis:PTSD, Bi-Polar D/O  Plan: F/U/ appointments scheduled weekly.  Lorrene CHRISTELLA Hasten, [facility]                                                                                                                   Lorrene CHRISTELLA Hasten, [facility]               Lorrene CHRISTELLA Hasten, [facility]               Lorrene CHRISTELLA Hasten,  [facility]               Lorrene CHRISTELLA Hasten, [facility]  [place] [facility] Counselor/Therapist Progress Note  Pa tient ID: Tamel Abel, MRN: [id number],    Date: 04/08/2024 This session was held via video teletherapy. The patient consented to the video teletherapy and was located in his home during this session. He is aware it is the responsibility of the patient to secure confidentiality on his end of the session. The provider was in a private home office for the duration of this session.   The patient arrived on time for her Caregility  session  Time Spent: 57 minutes, 2 PM to 2:57 PM  Treatment Type: Individual Therapy Reported Symptoms: anxiety, panic attacks The patient is still having significant back pain.  He cannot sit anywhere for very long and has to be careful where he sits.  There is still some unsteadiness although he did not report any falls over the past 2 weeks.  His biggest concern now is for his wife.  She has been having some difficulty breathin g and they did a test so they found a small hole on the back side of her heart.  She is meeting with her cardiologist again next week to see what possible treatment is for her.  He still thinks there may be some issue with her diaphragm but she is meeting with the pulmonologist also.  He is doing what he can to help her.  His daughter's birthday is March 11 so he knows that is a difficult time for both he and his wife next week.  Typi cally they do something to remember her but because of the wife's current health issues they cannot go to [place] as they had planned so they are talking about what to do to  remember and celebrate her birthday.  We will continue to work on coping skills for helping with his anxiety and the progression of his Lewy body dementia diagnosis as well as processing his grief. Mental Status Exam: Appearance:  Well Groomed  Behavi or: Appropriate Motor: Pt. Reports some instability due to Lewey Body dementia Speech/Language:  Normal Rate Affect: Appropriate Mood: normal Thought process: normal Thought content:  WNL Sensory/Perceptual disturbances:  WNL Orientation: oriented to person, place, and situation Attention: Good Concentration: Good Memory: Impaired short term Fund of knowledge:  Good Insight:  Good Judgment:  Good Impulse Control: Go od  Risk Assessment: Danger to Self: No Self-injurious Behavior: No Danger to Others: No Duty to Warn:no Physical Aggression / Violence:No  Access to Firearms a concern: No  Gang Involvement:No  Treatment plan: Will employ cognitive behavioral therapy as well as grief and loss therapy for relief of anxiety and grief.  Goals of grief therapy or to have a healthier response to the loss of his daughter and less sadness as evide nced by patient report in therapy notes as well as to help him feel less overwhelmed by her loss.  Goals are to see a 50% reduction in grief symptoms over the next 6 months.  I will encourage the use of the patient telling his grief story about his daughter including encouraging him to bring pictures and memorabilia to help process his feelings including any feelings of guilt associated with her loss.  We will use cognitive behavioral t herapy to help identify and change anxiety producing thoughts and behavior patterns so as to improve his ability to better manage anxiety and stress, and manage thoughts and worrisome thinking contributing to anxiety.  We will also refresh and encourage dialectical behavior therapy distress tolerance and mindfulness skills  with the intention of reducing anxiety  by 50% over the next 6 months. Progress: 30% Interventions: Cognitive Beha vioral Therapy  Diagnosis:PTSD, Bi-Polar D/O  Plan: F/U/ appointments scheduled weekly.  Lorrene CHRISTELLA Hasten, [facility]                                                                                                                   Lorrene CHRISTELLA Hasten, [facility]  [place] [facility] Counselor/Therapist Progress Note  Patient ID: Melissa Pulido, MRN: [id number],    Date: 04/08/2024 This session was held via video teletherapy. The patie nt consented to the video teletherapy and was located in his home during this session. He is aware it is the  responsibility of the patient to secure confidentiality on his end of the session. The provider was in a private home office for the duration of this session.   The patient arrived on time for her Caregility session  Time Spent: 58 minutes, 2 PM to 2:58 PM  Treatment Type: Individual Therapy Reported Symptoms: anxiety, pa nic attacks A fairly difficult week because it was his daughter's birthday.  He said this year was more difficult than last year but he could not pinpoint why.  They did remember her about going out to a nice restaurant which is something her daughter always wanted to do.  He said they were able to laugh and joke about funny things that his daughter had said or done which is not something they were able to do this time last year so he  saw that as healthy grieving.  He knows that the time change threw things off a little bit for everybody and his wife still is not feeling well and his back has been bothering him.  He was not able to go to his first round of physical therapy this week because his back was bothering him more.  He has been thinking a lot more about his daughter which he feels comfortable with.  He is thankful that it is getting warmer and staying light l onger so he can get back to some of the coping skills such as fishing that he has done in the past.   We will continue to work on coping skills for helping with his anxiety and the progression of his Lewy body dementia diagnosis as well as processing his grief. Mental Status Exam: Appearance:  Well Groomed  Behavior: Appropriate Motor: Pt. Reports some instability due to Lewey Body dementia Speech/Language:  Normal Rate Aff ect: Appropriate Mood: normal Thought process: normal Thought content:  WNL Sensory/Perceptual disturbances:  WNL Orientation: oriented to person, place, and situation Attention: Good Concentration: Good Memory: Impaired short term Fund of knowledge:  Good Insight:   Good Judgment:  Good Impulse Control: Good  Risk Assessment: Danger to Self: No Self-injurious Behavior: No Danger to Others: No Duty to Warn:no P hysical Aggression / Violence:No  Access to Firearms a concern: No  Gang Involvement:No  Treatment plan: Will employ cognitive behavioral therapy as well as grief and loss therapy for relief of anxiety and grief.  Goals of grief therapy or to have a healthier response to the loss of his daughter and less sadness as evidenced by patient report in therapy notes as well as to help him feel less overwhelmed by her loss.  Goals are to see  a 50% reduction in grief symptoms over the next 6 months.  I will encourage the use of the patient telling his grief story about his daughter including encouraging him to bring pictures and memorabilia to help process his feelings including any feelings of guilt associated with her loss.  We will use cognitive behavioral therapy to help identify and change anxiety producing thoughts and behavior patterns so as to improve his ability to  better manage anxiety and stress, and manage thoughts and worrisome thinking contributing to anxiety.  We will also refresh and encourage dialectical behavior therapy  distress tolerance and mindfulness skills with the intention of reducing anxiety by 50% over the next 6 months. Progress: 30% Interventions: Cognitive Behavioral Therapy  Diagnosis:PTSD, Bi-Polar D/O  Plan: F/U/ appointments scheduled weekly.  Lorrene CHRISTELLA Hasten, [facility]                                                                                                                   Lorrene CHRISTELLA Hasten, [facility]               Lorrene CHRISTELLA Hasten, [facility]               Lorrene CHRISTELLA Hasten, [facility]  [place] [facility] Counselor/Therapist Progress Note  Patient ID: Carolos Fecher, MRN: [id number],    Date: 04/08/2024 This session was held via video teletherapy. The patient consented to the vid eo teletherapy and was located in his home during this session. He is aware it is the responsibility of the patient to secure confidentiality on his end of the session. The provider was in a private home office for the duration of this session.   The patient arrived on time for her Caregility session  Time Spent: 57 minutes, 2 PM to 2:57 PM  Treatment Type: Individual Therapy Reported Symptoms: anxiety, panic attacks The patien t is still having significant back pain.  He cannot sit anywhere for very long and has to be careful where he sits.  There is still some unsteadiness although he did not report any  falls over the past 2 weeks.  His biggest concern now is for his wife.  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  She is meeting with her cardiologist again next week to see wh at possible treatment is for her.  He still thinks there may be some issue with her diaphragm but she is meeting with the pulmonologist also.  He is doing what he can to help her.  His daughter's birthday is March 11 so he knows that is a difficult time for both he and his wife next week.  Typically they do something to remember her but because of the wife's current health issues they cannot go to [place] as they had planned so  they are talking about what to do to remember and celebrate her birthday.  We will continue to work on coping skills for helping with his anxiety and the progression of his Lewy body dementia diagnosis as well as processing his grief. Mental Status Exam: Appearance:  Well Groomed  Behavior: Appropriate Motor: Pt. Reports some instability due to Lewey Body dementia Speech/Language:  Normal Rate Affect: Appropriate Mood: norm al Thought process: normal Thought content:  WNL Sensory/Perceptual disturbances:  WNL Orientation: oriented to person, place, and situation Attention: Good Concentration: Good Memory: Impaired short term Fund of knowledge:  Good Insight:  Good Judgment:  Good Impulse Control: Good  Risk Assessment: Danger to Self: No Self-injurious Behavior: No Danger to Others: No Duty to Warn:no Physical Aggression / Violenc e:No  Access to Firearms a concern: No  Gang Involvement:No  Treatment plan: Will employ cognitive behavioral therapy as well as grief and loss therapy for relief of anxiety and grief.  Goals of grief therapy or to have a healthier response to the loss of his daughter and less sadness as evidenced by patient report in therapy notes as well as to help him feel  less overwhelmed by her loss.  Goals are to see a 50% reduction in grief sy mptoms over the next 6 months.  I will encourage the use of the patient telling his grief story about his daughter including encouraging him to bring pictures and memorabilia to help process his feelings including any feelings of guilt associated with her loss.  We will use cognitive behavioral therapy to help identify and change anxiety producing thoughts and behavior patterns so as to improve his ability to better manage anxiety and s tress, and manage thoughts and worrisome thinking contributing to anxiety.  We will also refresh and encourage dialectical behavior therapy distress tolerance and mindfulness  skills with the intention of reducing anxiety by 50% over the next 6 months. Progress: 30% Interventions: Cognitive Behavioral Therapy  Diagnosis:PTSD, Bi-Polar D/O  Plan: F/U/ appointments scheduled weekly.  Lorrene CHRISTELLA Hasten, [facility]                                                                                                                    Lorrene CHRISTELLA Hasten, [facility]  [place] [facility] [facility] Counselor/Therapist Progress Note  Patient ID: Carles Florea, MRN: [id number],    Date: 04/08/2024 This session was held via video teletherapy. The patient consented to the video teletherapy and was located in his home during this session. He is aware it is the responsibility of the patient to s ecure confidentiality on his end of the session. The provider was in a private home office for the duration of this session.   The patient arrived on time for her Caregility session  Time Spent: 57 minutes, 2 PM to 2:57 PM  Treatment Type: Individual Therapy Reported Symptoms: anxiety, panic attacks That was a situation earlier in the week when the patient's neighbor was banging on his back door and window.  There had been situ ations where the same neighbor had said things and use profanity directed at his wife feeling that the patient and his wife and let their dog use the bathroom in his yard.  The patient said they are very careful because her dog is old and did not think that happen but did not appreciate how they were handled things.  He said he got angry very quickly and did not handle it the way that he wanted to.  The situation has been resolved but t he patient has had very clear boundaries with his neighbor. His wife continues to progress well from her surgery.  Physically the patient is doing fairly well.  He had to see one of  his brothers this week.  He is spending time with his son and son's family.  He has gotten some retirement/insurance things works out for the most part and is glad to have that settled. We will continue to work on coping skills for helping with his anxiety  and the progression of his Lewy body dementia diagnosis as well as processing his grief. Mental Status Exam: Appearance:  Well Groomed  Behavior: Appropriate Motor: Pt. Reports some instability due to Lewey Body dementia Speech/Language:  Normal Rate Affect: Appropriate Mood: normal Thought process: normal Thought content:  WNL Sensory/Perceptual disturbances:  WNL Orientation: oriented to person, place, and situatio n Attention: Good Concentration: Good Memory: Impaired short term Fund of knowledge:  Good Insight:  Good Judgment:  Good Impulse Control: Good  Risk Assessment: Danger to Self: No Self-injurious Behavior: No Danger to Others: No Duty to Warn:no Physical Aggression / Violence:No  Access to Firearms a concern: No  Gang Involvement:No  Treatment plan: Will employ cognitive behavioral therapy as well as grief and loss  therapy for relief of anxiety and grief.  Goals of grief therapy or to have a healthier response to the loss of his daughter and less sadness as evidenced by patient report in therapy notes as well as to help him feel less overwhelmed by her loss.  Goals are to see a 50% reduction in grief symptoms over the next 6 months.  I will encourage the use of the patient telling his grief story about his daughter including encouraging him to bri ng pictures and memorabilia to help process his feelings including any feelings of guilt associated with her loss.  We will use cognitive behavioral therapy to help identify and change anxiety producing thoughts and behavior patterns so as to improve his ability to better manage anxiety and stress, and manage thoughts and worrisome thinking contributing to anxiety.   We will also refresh and encourage dialectical behavior therapy distres s tolerance and mindfulness skills with the intention of reducing anxiety by 50% over  the next 6 months. Progress: 30% Interventions: Cognitive Behavioral Therapy  Diagnosis:PTSD, Bi-Polar D/O  Plan: F/U/ appointments scheduled weekly.  Lorrene CHRISTELLA Hasten, [facility]                                                                                                                    Lorrene CHRISTELLA Hasten, [facility]               Lorrene CHRISTELLA Hasten, [facility]               Lorrene CHRISTELLA Hasten, [facility]               Lorrene CHRISTELLA Hasten, [facility]               Lorrene CHRISTELLA Hasten, [facility]               Lorrene CHRISTELLA Hasten, [facility]               Lorrene CHRISTELLA Hasten, [facility]               Lorrene CHRISTELLA Hasten, [facility]                Lorrene CHRISTELLA Hasten, [facility]               Lorrene CHRISTELLA Hasten, [facility]  [place] [facility]  Counselor/Therapist Progress Note  Patient ID: Saulo Anthis, MRN: [id number],    Date: 04/01/2024 This session was held via video teletherapy. The patient consented to the video teletherapy and was located in his home during this session. He is aware it is the responsibility of the patient to secure confidentiality on his end of the session. The provider was in a private home office for the duration of this session.   The pa tient arrived on time for her Caregility session  Time Spent: 59 minutes, 2 PM to 2:59 PM  Treatment Type: Individual Therapy Reported Symptoms: anxiety, panic attacks The patient said he jokingly said to his wife that he had not had any pain issues in the cervical area and then a few days ago he started having pain in his neck and shoulders.  He now sits related to degenerative disk and has some consistent pain somewhere in his sp ine with that but jokingly says it just depends on Wednesday which part of his body is decides to show up and hurt.  He is doing the best that he can.  He is still walking which he knows is good for him but does not have right now the strength to do as much in the morning as he has done in the past so he is denying that to 3 times a day for consistency.  He is still doing some positive things and that he is staying in touch with his bro ther and other family members.  He is reconnecting with friends from high school and he is which she is enjoying.  One of them has had a similar experience in that she lost her son in  the same way the patient lost his daughter.  He said they did not think and Easter in which they set a place for his daughter at the table and put her picture there.  He initially had reservations about doing that he and his wife had made everyone feel as  if she was really there with him.  He still feels that he is grieving in a healthy way.  He and his wife have always taken a trip around both of their birthdays which are 2 days apart.  That is now close to his daughter died so they have started taking some of her rashes with him and sprinkling them where they go as a way of remembrance.  They are deciding what that looks like this year in July.  He does contract for safety having no  thoughts of hurting himself or anyone else. We will continue to work on coping skills for helping with his anxiety and the progression of his Lewy body dementia diagnosis as well as processing his grief. Mental Status Exam: Appearance:  Well Groomed  Behavior: Appropriate Motor: Pt. Reports some instability due to Lewey Body dementia Speech/Language:  Normal Rate Affect: Appropriate Mood: normal Thought process: normal Tho ught content:  WNL Sensory/Perceptual disturbances:  WNL Orientation: oriented to person, place, and situation Attention: Good Concentration: Good Memory: Impaired short term Fund of knowledge:  Good Insight:  Good Judgment:  Good Impulse Control: Good  Risk Assessment: Danger to Self: No Self-injurious Behavior: No Danger to Others: No Duty to Warn:no Physical Aggression / Violence:No  Access to Firearms a conc ern: No  Gang Involvement:No  Treatment plan: Will employ cognitive behavioral therapy as well as grief and loss therapy for relief of anxiety and grief.  Goals of grief therapy  or to have a healthier response to the loss of his daughter and less sadness as evidenced by patient report in therapy notes as well as to help him feel less overwhelmed by her loss.  Goals  are to see a 50% reduction in grief symptoms over the next 6 months.   I will encourage the use of the patient telling his grief story about his daughter including encouraging him to bring pictures and memorabilia to help process his feelings including any feelings of guilt associated with her loss.  We will use cognitive behavioral therapy to help identify and change anxiety producing thoughts and behavior patterns so as to improve his ability to better manage anxiety and stress, and manage thoughts and w orrisome thinking contributing to anxiety.  We will also refresh and encourage dialectical behavior therapy distress tolerance and mindfulness skills with the intention of reducing anxiety by 50% over the next 6 months. Progress: 30% Interventions: Cognitive Behavioral Therapy  Diagnosis:PTSD, Bi-Polar D/O  Plan: F/U/ appointments scheduled weekly.  Lorrene CHRISTELLA Hasten, [facility]                                                                                                                    Lorrene CHRISTELLA Hasten, [facility]  [place] [facility] Counselor/Therapist Progress Note  Patient ID: Mylik Pro, MRN: [id number],    Date: 04/01/2024 This session was held via video teletherapy. The patient consented to the video teletherapy and was located in his home during this session. He is aware it is the responsibility of the patient to secure confidentiality on his en d of the session. The provider was in a private home office for the duration of this session.   The patient arrived on time for her Caregility session  Time Spent: 58 minutes, 2 PM to 2:58 PM  Treatment Type: Individual Therapy Reported Symptoms: anxiety, panic attacks A fairly difficult week because it was his daughter's birthday.  He said this year was more difficult than last year but he could not pinpoint why.  They did rem ember her about going out to a nice restaurant which is something her daughter always wanted to do.  He said they were able to laugh and joke about funny things that his daughter had said or done which is not something they were able to do this time last year so he saw that as healthy grieving.  He knows that the time change threw things off a little bit for everybody and his wife still is not feeling well and his back has been botherin g him.  He was not able to go to his first round of physical therapy this week because his back was bothering him more.  He has been thinking a lot more about his daughter which he  feels comfortable with.  He is thankful that it is getting warmer and staying light longer so he can get back to some of the coping skills such as fishing that he has done in the past.   We will continue to work on coping skills for helping with his anxiet y and the progression of his Lewy body dementia diagnosis as well as processing his grief. Mental Status Exam: Appearance:  Well Groomed  Behavior: Appropriate Motor: Pt. Reports some instability due to Lewey Body dementia Speech/Language:  Normal Rate Affect: Appropriate Mood: normal Thought process: normal Thought content:  WNL Sensory/Perceptual disturbances:  WNL Orientation: oriented to person, place, and situati on Attention: Good Concentration: Good Memory: Impaired short term Fund of knowledge:  Good Insight:  Good Judgment:  Good Impulse Control: Good  Risk Assessment: Danger to Self: No Self-injurious Behavior: No Danger to Others: No Duty to Warn:no Physical Aggression / Violence:No  Access to Firearms a concern: No  Gang Involvement:No  Treatment plan: Will employ cognitive behavioral therapy as well as grief and loss  therapy for relief of anxiety and grief.  Goals of grief therapy or to have a healthier response to the loss of his daughter and less sadness as evidenced by patient report in therapy notes as well as to help him feel less overwhelmed by her loss.  Goals are to see a 50% reduction in grief symptoms over the next 6 months.  I will encourage the use of the patient telling his grief story about his daughter including encouraging him to br ing pictures and memorabilia to help process his feelings including any feelings of guilt associated with her loss.  We will use cognitive behavioral therapy to help identify and change anxiety producing thoughts and behavior patterns so as to improve his ability to better manage anxiety and stress, and manage thoughts and worrisome thinking contributing to  anxiety.  We will also refresh and encourage dialectical behavior therapy distre  ss tolerance and mindfulness skills with the intention of reducing anxiety by 50% over the next 6 months. Progress: 30% Interventions: Cognitive Behavioral Therapy  Diagnosis:PTSD, Bi-Polar D/O  Plan: F/U/ appointments scheduled weekly.  Lorrene CHRISTELLA Hasten, [facility]                                                                                                                    Lorrene CHRISTELLA Hasten, [facility]               Lorrene CHRISTELLA Hasten, [facility]               Lorrene CHRISTELLA Hasten, [facility]  [place] [facility] Counselor/Therapist Progress  Note  Patient ID: Antoinette Haskett, MRN: [id number],    Date: 04/01/2024 This session was held via video teletherapy. The patient consented to the video teletherapy and was located in his home during this session. He is aware it is the responsibility of the patient to secure confidentiality on his end of the session. The provider was  in a private home office for the duration of this session.   The patient arrived on time for her Caregility session  Time Spent: 1:05 PM until 2 PM, 55 minutes Treatment Type: Individual Therapy Reported Symptoms: anxiety, panic attacks The patient said he came in from his walk this morning and saw what he thought was some sort of tic from his wife.  It was not the first time it happened and he wonders if it might be tardive d yskinesia because of some new medications that she is on.  She has a call into the doctor right now and hopes to hear something back soon.  They have narrowed down to the fact they think most of her discomfort in breathing is coming from the diaphragm and they are looking for alternatives for treatment.  The patient continues to do those things which help him cope including walking, fishing when he can, spending time with his son and  daughter-in-law and grandson.  He also has a way of continued coping with grief he is writing letters to his daughters as a way of expressing his feelings and says that is very beneficial.  He appears to be grieving in a healthy way but he knows that he to year anniversary of her death is coming up and there is some grief there but says it is not overwhelming.  We will continue to work on coping skills for helping with his anxiety and  the progression of his Lewy body dementia diagnosis as well as processing his grief. Mental Status Exam: Appearance:  Well Groomed  Behavior: Appropriate Motor: Pt. Reports some instability due to Lewey Body dementia Speech/Language:  Normal  Rate Affect: Appropriate Mood: normal Thought process: normal Thought content:  WNL Sensory/Perceptual disturbances:  WNL Orientation: oriented to person, place, and situation A ttention: Good Concentration: Good Memory: Impaired short term Fund of knowledge:  Good Insight:  Good Judgment:  Good Impulse Control: Good  Risk Assessment: Danger to Self: No Self-injurious Behavior: No Danger to Others: No Duty to Warn:no Physical Aggression / Violence:No  Access to Firearms a concern: No  Gang Involvement:No  Treatment plan: Will employ cognitive behavioral therapy as well as grief and loss ther apy for relief of anxiety and grief.  Goals of grief therapy or to have a healthier response to the loss of his daughter and less sadness as evidenced by patient report in therapy notes as well as to help him feel less overwhelmed by her loss.  Goals are to see a 50% reduction in grief symptoms over the next 6 months.  I will encourage the use of the patient telling his grief story about his daughter including encouraging him to bring p ictures and memorabilia to help process his feelings including any feelings of guilt associated with her loss.  We will use cognitive behavioral therapy to help identify and change anxiety producing thoughts and behavior patterns so as to improve his ability to better manage anxiety and stress, and manage thoughts and worrisome thinking contributing to anxiety.  We will also refresh and encourage dialectical behavior therapy distress to lerance  and mindfulness skills with the intention of reducing anxiety by 50% over the next 6 months. Progress: 30% Interventions: Cognitive Behavioral Therapy  Diagnosis:PTSD, Bi-Polar D/O  Plan: F/U/ appointments scheduled weekly.  Lorrene CHRISTELLA Hasten,  [facility]                                                                                                                    Lorrene CHRISTELLA Hasten, [facility]  [place] [facility] Counselor/The rapist Progress Note  Patient ID: Watson Robarge, MRN: [id number],    Date: 04/01/2024 This session was held via video teletherapy. The patient consented to the video teletherapy and was located in his home during this session. He is aware it is the responsibility of the patient to secure confidentiality on his end of the session. The provider was in a private home office for the duration of this session.   The patient arrived   on time for her Caregility session  Time Spent: 59 minutes, 2 PM to 2:59 PM  Treatment Type: Individual Therapy Reported Symptoms: anxiety, panic attacks The patient continues to present with physical pain and both his back and his knee.  He started with a new physical therapist and told him that there was a lot of arthritis in both knees and or certain things he cannot do but they had him do that anyway and now for the past 3 day s he has been in significant knee pain.  He has continued to walk because he knows that is good for him in various ways but says it has been painful.  His wife also was not feeling well but she is going back to the doctor next week to have an ablation and hopes that will bring her some relief.  For the most part he has been at the last couple weeks reaching back out and hearing from some old friends as well as time with his son and son' s family.  He says that his wife's cousin is coming down for a weekend soon and they enjoy time with her and that he will go to his brother's house sometime in the next few weeks.  He recognizes the need for people that he cares about in helping with his depression. We will continue to work on coping skills for helping with his anxiety and the progression of his Lewy body dementia diagnosis as well as processing his grief. Mental Stat us  Exam: Appearance:  Well Groomed  Behavior: Appropriate Motor: Pt. Reports some instability due to Lewey Body dementia Speech/Language:  Normal Rate Affect: Appropriate Mood: normal Thought process: normal Thought content:  WNL Sensory/Perceptual disturbances:  WNL Orientation: oriented to person, place, and situation Attention: Good Concentration: Good Memory: Impaired short term Fund of knowledge:  Good Insigh t:  Good Judgment:  Good Impulse Control: Good  Risk Assessment: Danger to Self: No Self-injurious Behavior: No Danger to Others: No Duty to Warn:no Physical Aggression /  Violence:No  Access to Firearms a concern: No  Gang Involvement:No  Treatment plan: Will employ cognitive behavioral therapy as well as grief and loss therapy for relief of anxiety and grief.  Goals of grief therapy or to have a healthier response to th e loss of his daughter and less sadness as evidenced by patient report in therapy notes as well as to help him feel less overwhelmed by her loss.  Goals are to see a 50% reduction in grief symptoms over the next 6 months.  I will encourage the use of the patient telling his grief story about his daughter including encouraging him to bring pictures and memorabilia to help process his feelings including any feelings of guilt associated wi th her loss.  We will use cognitive behavioral therapy to help identify and change anxiety producing thoughts and behavior patterns so as to improve his ability to better manage anxiety and stress, and manage thoughts and worrisome thinking contributing to anxiety.  We will  also refresh and encourage dialectical behavior therapy distress tolerance and mindfulness skills with the intention of reducing anxiety by 50% over the next 6 month s. Progress: 30% Interventions: Cognitive Behavioral Therapy  Diagnosis:PTSD, Bi-Polar D/O  Plan: F/U/ appointments scheduled weekly.  Lorrene CHRISTELLA Hasten, [facility]                                                                                                                   Lorrene CHRISTELLA Hasten, [facility]                Lorrene CHRISTELLA Hasten, [facility]               Lorrene CHRISTELLA Hasten, [facility]               Lorrene CHRISTELLA Hasten,  [facility]  [place] [facility] Counselor/Therapist Progress Note  Patient ID: Christion Leonhard, MRN: [id number],    Date: 04/01/2024 This session was held via video teletherapy. The patient consented to the video teletherapy and was located in his home during this session. He is aware it is the responsibility of the patient to secure confidentiality on his end of the session. The provider was in a private home office for the durati on of this session.   The patient arrived on time for her Caregility session  Time Spent: 57 minutes, 2 PM to 2:57 PM  Treatment Type: Individual Therapy Reported Symptoms: anxiety, panic attacks The patient is still having significant back pain.  He cannot sit anywhere for very long and has to be  careful where he sits.  There is still some unsteadiness although he did not report any falls over the past 2 weeks.  His biggest co ncern now is for his wife.  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  She is meeting with her cardiologist again next week to see what possible treatment is for her.  He still thinks there may be some issue with her diaphragm but she is meeting with the pulmonologist also.  He is doing what he can to help her.  His daughter's birthday is March 11 so he  knows that is a difficult time for both he and his wife next week.  Typically they do something to remember her but because of the wife's current health issues they cannot go to [place] as they had planned so they are talking about what to do to remember and celebrate her birthday.  We will continue to work on coping skills for helping with his anxiety and the progression of his Lewy body dementia diagnosis as well as processing  his grief. Mental Status Exam: Appearance:  Well Groomed  Behavior: Appropriate Motor: Pt. Reports some instability due to Lewey Body dementia Speech/Language:  Normal Rate Affect: Appropriate Mood: normal Thought process: normal Thought content:  WNL Sensory/Perceptual disturbances:  WNL Orientation: oriented to person, place, and situation Attention: Good Concentration: Good Memory: Impaired short term Fund of  knowledge:  Good Insight:  Good Judgment:  Good Impulse Control: Good  Risk Assessment: Danger to Self: No Self-injurious Behavior: No Danger to Others: No Duty to Warn:no Physical Aggression / Violence:No  Access to Firearms a concern: No  Gang Involvement:No  Treatment plan: Will employ cognitive behavioral therapy as well as grief and loss therapy for relief of anxiety and grief.  Goals of grief therapy or to have a  healthier response to the loss of his daughter and  less sadness as evidenced by patient report in therapy notes as well as to help him feel less overwhelmed by her loss.  Goals are to see a 50% reduction in grief symptoms over the next 6 months.  I will encourage the use of the patient telling his grief story about his daughter including encouraging him to bring pictures and memorabilia to help process his feelings including any feeling s of guilt associated with her loss.  We will use cognitive behavioral therapy to help identify and change anxiety producing thoughts and behavior patterns so as to improve his ability to better manage anxiety and stress, and manage thoughts and worrisome thinking contributing to anxiety.  We will also refresh and encourage dialectical behavior therapy distress tolerance and mindfulness skills  with the intention of reducing anxiety by 5 0% over the next 6 months. Progress: 30% Interventions: Cognitive Behavioral Therapy  Diagnosis:PTSD, Bi-Polar D/O  Plan: F/U/ appointments scheduled weekly.  Lorrene CHRISTELLA Hasten, [facility]                                                                                                                   Lorrene CHRISTELLA Hasten, [facility]  [place] [facility] Counselor/Therapist Progress Note  Patient ID: Jurgen Groeneveld, MRN: [id number],     Date: 04/01/2024 This session was held via video teletherapy. The patient consented to the video teletherapy and was located in his home during this session. He is aware it is the responsibility of the patient to secure confidentiality on his end of the session. The provider was in a private home office for the duration of this session.   The patient arrived on time for her Caregility session  Time Spent: 58 minutes, 2 PM to 2:5 8 PM  Treatment Type: Individual Therapy Reported Symptoms: anxiety, panic attacks A fairly difficult week because it was his daughter's birthday.  He said this year was more difficult than last year but he could not pinpoint why.  They did remember her about going out to a nice restaurant which is something her daughter always wanted to do.  He said they were able to laugh and joke about funny things that his daughter had said or do ne which is not something they were able to do this time last year so he saw that as healthy grieving.  He knows that the time change threw things off a little bit for everybody and his  wife still is not feeling well and his back has been bothering him.  He was not able to go to his first round of physical therapy this week because his back was bothering him more.  He has been thinking a lot more about his daughter which he feels comfor table with.  He is thankful that it is getting warmer and staying light longer so he can get back to some of the coping skills such as fishing that he has done in the past.   We will continue to work on coping skills for helping with his anxiety and the progression of his Lewy body dementia diagnosis as well as processing his grief. Mental Status Exam: Appearance:  Well Groomed  Behavior: Appropriate Motor: Pt. Reports some i nstability due to Lewey Body dementia Speech/Language:  Normal Rate Affect: Appropriate Mood: normal Thought process: normal Thought content:  WNL Sensory/Perceptual disturbances:  WNL Orientation: oriented to person, place, and situation Attention: Good Concentration: Good Memory: Impaired short term Fund of knowledge:  Good Insight:  Good Judgment:  Good Impulse Control: Good  Risk Assessment: Danger to Self:  No Self-injurious Behavior: No Danger to Others: No Duty to Warn:no Physical Aggression / Violence:No  Access to Firearms a concern: No  Gang Involvement:No  Treatment plan: Will employ cognitive behavioral therapy as well as grief and loss therapy for relief of anxiety and grief.  Goals of grief therapy or to have a healthier response to the loss of his daughter and less sadness as evidenced by patient report in therapy notes as  well as to help him feel less overwhelmed by her loss.  Goals are to see a 50% reduction in grief symptoms over the next 6 months.  I will encourage the use of the patient telling his grief story about his daughter including encouraging him to bring pictures and memorabilia to help process his feelings including any feelings of guilt associated with her loss.  We will  use cognitive behavioral therapy to help identify and change anxiety  producing thoughts and behavior patterns so as to improve his ability to better manage anxiety and stress, and manage thoughts and worrisome thinking contributing to anxiety.  We will also refresh and encourage dialectical behavior therapy  distress tolerance and mindfulness skills with the intention of reducing anxiety by 50% over the next 6 months. Progress: 30% Interventions: Cognitive Behavioral Therapy  Diagnosis:PTSD, Bi-Polar  D/O  Plan: F/U/ appointments scheduled weekly.  Lorrene CHRISTELLA Hasten, [facility]                                                                                                                   Lorrene CHRISTELLA Hasten, [facility]               Lorrene CHRISTELLA Hasten, [facility]               Lorrene CHRISTELLA Hasten, [facility]  [place] [facility] Counselor/Therapist Progress Note  Patient ID: Antuane Eastridge, MRN: [id number],    Date: 04/01/2024 Thi s session was held via video teletherapy. The patient consented to the video teletherapy and was located in his home during this session. He is aware it is the responsibility of the patient to secure confidentiality on his end of the session. The provider was in a private home office for the duration of this session.   The patient arrived on time for her Caregility session  Time Spent: 57 minutes, 2 PM to 2:57 PM  Treatment Type:  Individual Therapy Reported Symptoms: anxiety, panic attacks The patient is still having significant back pain.  He cannot sit anywhere for very long and has to be careful where he sits.  There is still some unsteadiness although he did not report any falls over the past 2 weeks.  His biggest concern now is for his wife.  She has been having some difficulty breathing and they did a test so they found a small hole on the back side of  her heart.  She is meeting with her cardiologist again next week to see what possible treatment is for her.  He still thinks there may be some issue with her diaphragm but she is meeting with the pulmonologist also.  He is doing what he can to help her.  His daughter's birthday is March 11 so he knows that is a difficult time for both he and his wife next week.  Typically they do something to remember her but because of the wife's cur rent health issues they cannot go to [place] as they had planned so they are talking about what to do to remember and celebrate her birthday.  We will continue to work on coping  skills for helping with his anxiety and the progression of his Lewy body dementia diagnosis as well as processing his grief. Mental Status Exam: Appearance:  Well Groomed  Behavior: Appropriate Motor: Pt. Reports some instability due to [employer] y dementia Speech/Language:  Normal Rate Affect: Appropriate Mood: normal Thought process: normal Thought content:  WNL Sensory/Perceptual disturbances:  WNL Orientation: oriented to person, place, and situation Attention: Good Concentration: Good Memory: Impaired short term Fund of knowledge:  Good Insight:  Good Judgment:  Good Impulse Control: Good  Risk Assessment: Danger to Self: No Self-injurious Behavior : No Danger to Others: No Duty to Warn:no Physical Aggression / Violence:No  Access to Firearms a concern: No  Gang Involvement:No  Treatment plan: Will employ cognitive behavioral therapy as well as grief and loss therapy for relief of anxiety and grief.  Goals of grief therapy or to have a healthier response to the loss of his daughter and less sadness as evidenced by patient report in therapy notes as well as to help him feel l ess overwhelmed by her loss.  Goals are to see a 50% reduction in grief symptoms over the next 6 months.  I will encourage the use of the patient telling his grief story about his daughter including encouraging him to bring pictures and memorabilia to help process his feelings including any feelings of guilt associated with her loss.  We will use cognitive behavioral therapy to help identify and change anxiety producing thoughts and beh avior patterns so as to improve his ability to better manage anxiety and stress, and manage thoughts and worrisome thinking contributing to anxiety.  We will also refresh and encourage dialectical behavior therapy distress tolerance and mindfulness skills  with the intention of reducing anxiety by 50% over the next 6 months. Progress: 30% Interventions: Cognitive  Behavioral Therapy  Diagnosis:PTSD, Bi-Polar D/O  Plan: F/U/ appointm ents scheduled weekly.  Lorrene CHRISTELLA Hasten, [facility]                                                                                                                   Lorrene CHRISTELLA Hasten, [facility]  [place] [facility] Counselor/Therapist Progress Note  Patient ID: Anes Rigel, MRN: [id number],    Date: 04/01/2024 This session was held via video teletherapy. The patient consented to the video teletherapy and was located in his home du ring this session. He is aware it is the responsibility of the patient to secure confidentiality on his end of  the session. The provider was in a private home office for the duration of this session.   The patient arrived on time for her Caregility session  Time Spent: 57 minutes, 2 PM to 2:57 PM  Treatment Type: Individual Therapy Reported Symptoms: anxiety, panic attacks  We will continue to work on coping skills for helping  with his anxiety and the progression of his Lewy body dementia diagnosis as well as processing his grief. Mental Status Exam: Appearance:  Well Groomed  Behavior: Appropriate Motor: Pt. Reports some instability due to Lewey Body dementia Speech/Language:  Normal Rate Affect: Appropriate Mood: normal Thought process: normal Thought content:  WNL Sensory/Perceptual disturbances:  WNL Orientation: oriented to person, pl ace, and situation Attention: Good Concentration: Good Memory: Impaired short term Fund of knowledge:  Good Insight:  Good Judgment:  Good Impulse Control: Good  Risk Assessment: Danger to Self: No Self-injurious Behavior: No Danger to Others: No Duty to Warn:no Physical Aggression / Violence:No  Access to Firearms a concern: No  Gang Involvement:No  Treatment plan: Will employ cognitive behavioral therapy as well a s grief and loss therapy for relief of anxiety and grief.  Goals of grief therapy or to have a healthier response to the loss of his daughter and less sadness as evidenced by patient report in therapy notes as well as to help him feel less overwhelmed by her loss.  Goals are to see a 50% reduction in grief symptoms over the next 6 months.  I will encourage the use of the patient telling his grief story about his daughter including encou raging him to bring pictures and memorabilia to help process his feelings including any feelings of guilt associated with her loss.  We will use cognitive behavioral therapy to help identify and change anxiety producing thoughts and behavior patterns so as to improve his ability to better manage  anxiety and stress, and manage thoughts and worrisome thinking contributing to anxiety.  We will also refresh and encourage dialectical behavio r therapy distress tolerance and mindfulness skills with the intention of reducing anxiety by 50% over the next 6 months. Progress: 30% Interventions: Cognitive Behavioral Therapy  Diagnosis:PTSD, Bi-Polar D/O  Plan: F/U/ appointments scheduled weekly.  Lorrene CHRISTELLA Hasten, [facility]                                                                                                                    Lorrene CHRISTELLA Hasten, [facility]               Lorrene CHRISTELLA Hasten, [facility]               Lorrene CHRISTELLA Hasten, [facility]  Lorrene CHRISTELLA Hasten, [facility]               Lorrene CHRISTELLA Hasten, [facility]               Lorrene CHRISTELLA Hasten, [facility]               Lorrene CHRISTELLA Hasten, [facility]  [place] [facility] Counselor/Therapist Progress Note  Patient ID: Jaymon Dudek, MRN: [id number],    Date: 04/01/2024 This session was held via vide o teletherapy. The patient consented to the video teletherapy and was located in his home during this session. He is aware it is the responsibility of the patient to secure confidentiality on his end of the session. The provider was in a private home office for the duration of this session.   The patient arrived on time for her Caregility session  Time Spent: 39 minutes, 2 PM to 2:39 PM  Treatment Type: Individual Therapy Report ed Symptoms: anxiety, panic attacks The patient was not feeling well today.  He started feeling really earlier in the week having a fever bad headaches body aches.  He felt a little better yesterday but feels a little bit of a resurgence in headaches and body aches and is running a low-grade fever.  His wife is feeling some of the same symptoms.  For the most part he says he is doing well.  They did increase his memory medication becau se they were starting to see some increasing decline in short-term memory and he said until recently his long-term memory had been good but he was starting to see a difference in how he used to markers to remember what happened at certain times historically.  He feels the medication has helped and has brightened his mood a little also.  He is still trying to walk every day and fish when he can and has enjoyed watching the [employer] t ournament as a good distraction.  He reports that he feels he is managing mood fairly well.  He has an appointment in 2 weeks.  He does contract for safety having no thoughts of  hurting himself or anyone else. We will continue to work on coping skills for helping with his anxiety and the progression of his Lewy body dementia diagnosis as well as processing his grief. Mental Status Exam: Appearance:  Well Groomed  Behavior: App ropriate Motor: Pt. Reports some instability due to Lewey Body dementia Speech/Language:  Normal Rate Affect: Appropriate Mood: normal Thought process: normal Thought content:  WNL Sensory/Perceptual disturbances:  WNL Orientation: oriented to person, place, and situation Attention: Good Concentration: Good Memory: Impaired short term Fund of knowledge:  Good Insight:  Good Judgment:  Good Impulse Control: Good  R isk Assessment: Danger to Self: No Self-injurious Behavior: No Danger to Others: No Duty to Warn:no Physical Aggression / Violence:No  Access to Firearms a concern: No  Gang Involvement:No  Treatment plan: Will employ cognitive behavioral therapy as well as grief and loss therapy for relief of anxiety and grief.  Goals of grief therapy or to have a healthier response to the loss of his daughter and less sadness as evidenced by  patient report in therapy notes as well as to help him feel less overwhelmed by her loss.  Goals are to see a 50% reduction in grief symptoms over the next 6 months.  I will encourage the use of the patient telling his grief story about his daughter including encouraging him to bring pictures and memorabilia to help process his feelings including any feelings of guilt associated with her loss.  We will use cognitive behavioral therapy  to help identify and change anxiety producing thoughts and behavior patterns so as to improve his ability to better manage anxiety and stress, and manage thoughts and worrisome thinking contributing to anxiety.  We will also refresh and  encourage dialectical behavior therapy distress tolerance and mindfulness skills with the intention of reducing anxiety by 50%  over the next 6 months. Progress: 30% Interventions: Cognitive Behavioral  Therapy  Diagnosis:PTSD, Bi-Polar D/O  Plan: F/U/ appointments scheduled weekly.  Lorrene CHRISTELLA Hasten, [facility]                                                                                                                   Lorrene CHRISTELLA Hasten, [facility]  [place] [facility] Counselor/Therapist Progress Note  Patient ID: Jen Eppinger, MRN: [id number],    Date: 04/01/2024 This session was held via video teletherapy. The patient con sented to the video teletherapy and was located in his home during this session. He is aware it is the  responsibility of the patient to secure confidentiality on his end of the session. The provider was in a private home office for the duration of this session.   The patient arrived on time for her Caregility session  Time Spent: 58 minutes, 2 PM to 2:58 PM  Treatment Type: Individual Therapy Reported Symptoms: anxiety, panic at tacks A fairly difficult week because it was his daughter's birthday.  He said this year was more difficult than last year but he could not pinpoint why.  They did remember her about going out to a nice restaurant which is something her daughter always wanted to do.  He said they were able to laugh and joke about funny things that his daughter had said or done which is not something they were able to do this time last year so he saw th at as healthy grieving.  He knows that the time change threw things off a little bit for everybody and his wife still is not feeling well and his back has been bothering him.  He was not able to go to his first round of physical therapy this week because his back was bothering him more.  He has been thinking a lot more about his daughter which he feels comfortable with.  He is thankful that it is getting warmer and staying light longer  so he can get back to some of the coping skills such as fishing that he has done in the past.   We will continue to work on coping skills for helping with his anxiety and the progression of his Lewy body dementia diagnosis as well as processing his grief. Mental Status Exam: Appearance:  Well Groomed  Behavior: Appropriate Motor: Pt. Reports some instability due to Lewey Body dementia Speech/Language:  Normal Rate Affect: A ppropriate Mood: normal Thought process: normal Thought content:  WNL Sensory/Perceptual disturbances:  WNL Orientation: oriented to person, place, and situation Attention: Good Concentration: Good Memory: Impaired short term Fund of knowledge:  Good Insight:   Good Judgment:  Good Impulse Control: Good  Risk Assessment: Danger to Self: No Self-injurious Behavior: No Danger to Others: No Duty to Warn:no Physica l Aggression / Violence:No  Access to Firearms a concern: No  Gang Involvement:No  Treatment plan: Will employ cognitive behavioral therapy as well as grief and loss therapy for relief of anxiety and grief.  Goals of grief therapy or to have a healthier response to the loss of his daughter and less sadness as evidenced by patient report in therapy notes as well as to help him feel less overwhelmed by her loss.  Goals are to see a 50%  reduction in grief symptoms over the next 6 months.  I will encourage the use of the patient telling his grief story about his daughter including encouraging him to bring pictures and memorabilia to help process his feelings including any feelings of guilt associated with her loss.  We will use cognitive behavioral therapy to help identify and change anxiety producing thoughts and behavior patterns so as to improve his ability to bette r manage anxiety and stress, and manage thoughts and worrisome thinking contributing to anxiety.  We will also refresh and encourage dialectical behavior therapy  distress tolerance and mindfulness skills with the intention of reducing anxiety by 50% over the next 6 months. Progress: 30% Interventions: Cognitive Behavioral Therapy  Diagnosis:PTSD, Bi-Polar D/O  Plan: F/U/ appointments scheduled weekly.  Lorrene CHRISTELLA Hasten, [facility]                                                                                                                    Lorrene CHRISTELLA Hasten, [facility]               Lorrene CHRISTELLA Hasten, [facility]               Lorrene CHRISTELLA Hasten, [facility]  [place] [facility] Counselor/Therapist Progress Note  Patient ID: Kavaughn Faucett, MRN: [id number],    Date: 04/01/2024 This session was held via video teletherapy. The patient consented to the video teletherapy an d was located in his home during this session. He is aware it is the responsibility of the patient to secure confidentiality on his end of the session. The provider was in a private home office for the duration of this session.   The patient arrived on time for her Caregility session  Time Spent: 1:05 PM until 2 PM, 55 minutes Treatment Type: Individual Therapy Reported Symptoms: anxiety, panic attacks The patient said he came  in from his walk this morning and saw what he thought was some sort of tic from his wife.  It was not the first time it happened and he wonders if it might be tardive dyskinesia because  of some new medications that she is on.  She has a call into the doctor right now and hopes to hear something back soon.  They have narrowed down to the fact they think most of her discomfort in breathing is coming from the diaphragm and they are looking  for alternatives for treatment.  The patient continues to do those things which help him cope including walking, fishing when he can, spending time with his son and daughter-in-law and grandson.  He also has a way of continued coping with grief he is writing letters to his daughters as a way of expressing his feelings and says that is very beneficial.  He appears to be grieving in a healthy way but he knows that he to year anniversar y of her death is coming up and there is some grief there but says it is not overwhelming.  We will continue to work on coping skills for helping with his anxiety and the progression of his Lewy body dementia diagnosis as well as processing his grief. Mental Status Exam: Appearance:  Well Groomed  Behavior: Appropriate Motor: Pt. Reports some instability due to Lewey Body dementia Speech/Language:  Normal Rate Affect: Approp riate Mood: normal Thought process: normal Thought content:  WNL Sensory/Perceptual disturbances:  WNL Orientation: oriented to person, place, and situation Attention: Good Concentration: Good Memory: Impaired short term Fund of knowledge:  Good Insight:  Good Judgment:  Good Impulse Control: Good  Risk Assessment: Danger to Self: No Self-injurious Behavior: No Danger to Others: No Duty to Warn:no Physical Agg ression / Violence:No  Access to Firearms a concern: No  Gang Involvement:No  Treatment plan: Will employ cognitive behavioral therapy as well as grief and loss therapy for relief of anxiety and grief.  Goals of grief therapy or to have a healthier response to the loss of his daughter and less sadness as evidenced by patient report in therapy notes as well as to  help him feel less overwhelmed by her loss.  Goals are to see a 50% redu ction in grief symptoms over the next 6 months.  I will encourage the use of the patient telling his grief story about his daughter including encouraging him to bring pictures and memorabilia to help process his feelings including any feelings of guilt associated with her loss.  We will use cognitive behavioral therapy to help identify and change anxiety producing thoughts and behavior patterns so as to improve his ability to better man age anxiety and stress, and manage thoughts and worrisome thinking contributing to anxiety.  We will also refresh and encourage dialectical behavior therapy distress tolerance  and mindfulness skills with the intention of reducing anxiety by 50% over the next 6 months. Progress: 30% Interventions: Cognitive Behavioral Therapy  Diagnosis:PTSD, Bi-Polar D/O  Plan: F/U/ appointments scheduled weekly.  Lorrene CHRISTELLA Hasten, [facility]                                                                                                                    Lorrene CHRISTELLA Hasten, [facility]  [facility] Counselor/Therapist Progress Note  Patient ID: Jayvier Burgher, MRN: [id number],    Date: 04/01/2024 This session was held via video teletherapy. The patient consented to the video teletherapy and was located in his home during this session. He is aware it is the responsibility o f the patient to secure confidentiality on his end of the session. The provider was in a private home office for the duration of this session.   The patient arrived on time for her Caregility session  Time Spent: 59 minutes, 2 PM to 2:59 PM  Treatment Type: Individual Therapy Reported Symptoms: anxiety, panic attacks The patient continues to present with physical pain and both his back and his knee.  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  He has continued to walk because he knows that is good for him in various ways but says it has been painful.  His wife also was not feeling well but she is going back to the doctor next week to have an ablation and hopes that will  bring her some relief.  For the most part he has been at the last couple weeks reaching back out and hearing from some old friends as well as time with his son and son's family.  He  says that his wife's cousin is coming down for a weekend soon and they enjoy time with her and that he will go to his brother's house sometime in the next few weeks.  He recognizes the need for people that he cares about in helping with his depression. We w ill continue to work on coping skills for helping with his anxiety and the progression of his Lewy body dementia diagnosis as well as processing his grief. Mental Status Exam: Appearance:  Well Groomed  Behavior: Appropriate Motor: Pt. Reports some instability due to Lewey Body dementia Speech/Language:  Normal Rate Affect: Appropriate Mood: normal Thought process: normal Thought content:  WNL Sensory/Perceptual disturba nces:  WNL Orientation: oriented to person, place, and situation Attention: Good Concentration: Good Memory: Impaired short term Fund of knowledge:  Good Insight:  Good Judgment:  Good Impulse Control: Good  Risk Assessment: Danger to Self: No Self-injurious Behavior: No Danger to Others: No Duty to Warn:no Physical Aggression / Violence:No  Access to Firearms a concern: No  Gang Involvement:No  Treatment plan: W ill employ cognitive behavioral therapy as well as grief and loss therapy for relief of anxiety and grief.  Goals of grief therapy or to have a healthier response to the loss of his daughter and less sadness as evidenced by patient report in therapy notes as well as to help him feel less overwhelmed by her loss.  Goals are to see a 50% reduction in grief symptoms over the next 6 months.  I will encourage the use of the patient telling h is grief story about his daughter including encouraging him to bring pictures and memorabilia to help process his feelings including any feelings of guilt associated with her loss.  We will use cognitive behavioral therapy to help identify and change anxiety producing thoughts and behavior patterns so as to improve his ability to better manage anxiety and stress,  and manage thoughts and worrisome thinking contributing to anxiety.  We wi ll  also refresh and encourage dialectical behavior therapy distress tolerance and mindfulness skills with the intention of reducing anxiety by 50% over the next 6 months. Progress: 30% Interventions: Cognitive Behavioral Therapy  Diagnosis:PTSD, Bi-Polar D/O  Plan: F/U/ appointments scheduled weekly.  Lorrene CHRISTELLA Hasten, [facility]                                                                                                                    Lorrene CHRISTELLA Hasten, [facility]               Lorrene CHRISTELLA Hasten, [facility]               Lorrene CHRISTELLA Hasten, [facility]               Lorrene CHRISTELLA Hasten, [facility]  [facility] Counselor/Therapist Progress Note  Patient ID: Ilhan Madan, MRN: [id number],    Date: 04/01/2024 This session was held via video teletherapy. The patient consented to the video teletherapy and was located in his home during this se ssion. He is aware it is the responsibility of the patient to secure confidentiality on his end of the session. The provider was in a private home office for the duration of this session.   The patient arrived on time for her Caregility session  Time Spent: 57 minutes, 2 PM to 2:57 PM  Treatment Type: Individual Therapy Reported Symptoms: anxiety, panic attacks The patient is still having significant back pain.  He cannot sit a nywhere for very long and has to be careful where he sits.  There is still some unsteadiness although he did not report any falls over the past 2 weeks.  His biggest concern now is for his wife.  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  She is meeting with her cardiologist again next week to see what possible treatment is for her.  He still thinks there  may be some issue with her diaphragm but she is meeting with the pulmonologist also.  He is doing what he can to help her.  His daughter's birthday is March 11 so he knows that is a difficult time for both he and his wife next week.  Typically they do something to remember her but because of the wife's current health issues they cannot go to [place] as they had planned so they are talking about what to do to remember and celebra te her birthday.  We will continue to work on coping skills for helping with his anxiety and the progression of his Lewy body dementia diagnosis as well as processing his  grief. Mental Status Exam: Appearance:  Well Groomed  Behavior: Appropriate Motor: Pt. Reports some instability due to Lewey Body dementia Speech/Language:  Normal Rate Affect: Appropriate Mood: normal Thought process: normal Thought content:  WNL Sen sory/Perceptual disturbances:  WNL Orientation: oriented to person, place, and situation Attention: Good Concentration: Good Memory: Impaired short term Fund of knowledge:  Good Insight:  Good Judgment:  Good Impulse Control: Good  Risk Assessment: Danger to Self: No Self-injurious Behavior: No Danger to Others: No Duty to Warn:no Physical Aggression / Violence:No  Access to Firearms a concern: No  Gang Involvemen t:No  Treatment plan: Will employ cognitive behavioral therapy as well as grief and loss therapy for relief of anxiety and grief.  Goals of grief therapy or to have a healthier response to the loss of his daughter and less sadness as evidenced by patient report in therapy notes as well as to help him feel less overwhelmed by her loss.  Goals are to see a 50% reduction in grief symptoms over the next 6 months.  I will encourage the use  of the patient telling his grief story about his daughter including encouraging him to bring pictures and memorabilia to help process his feelings including any feelings of guilt associated with her loss.  We will use cognitive behavioral therapy to help identify and change anxiety producing thoughts and behavior patterns so as to improve his ability to better manage anxiety and stress, and manage thoughts and worrisome thinking contrib uting to anxiety.  We will also refresh and encourage dialectical behavior therapy distress tolerance and mindfulness skills  with the intention of reducing anxiety by 50% over the next 6 months. Progress: 30% Interventions: Cognitive Behavioral Therapy  Diagnosis:PTSD, Bi-Polar D/O  Plan: F/U/ appointments scheduled weekly.  Lorrene CHRISTELLA Hasten,  [facility]                                                                                                                    Lorrene CHRISTELLA Hasten, [facility]  [facility] Counselor/Therapist Progress Note  Patient ID: Kalen Ratajczak, MRN: [id number],    Date: 04/01/2024 This session was held via video teletherapy. The patient consented to the video teletherapy and was located in his home during this session. He is aware it is the responsibility of the patient to secure confidentiality on his end of the session. The pro vider was in a private home office for the duration of this session.   The patient arrived  on time for her Caregility session  Time Spent: 58 minutes, 2 PM to 2:58 PM  Treatment Type: Individual Therapy Reported Symptoms: anxiety, panic attacks A fairly difficult week because it was his daughter's birthday.  He said this year was more difficult than last year but he could not pinpoint why.  They did remember her about going out  to a nice restaurant which is something her daughter always wanted to do.  He said they were able to laugh and joke about funny things that his daughter had said or done which is not something they were able to do this time last year so he saw that as healthy grieving.  He knows that the time change threw things off a little bit for everybody and his wife still is not feeling well and his back has been bothering him.  He was not able t o go to his first round of physical therapy this week because his back was bothering him more.  He has been thinking a lot more about his daughter which he feels comfortable with.  He is thankful that it is getting warmer and staying light longer so he can get back to some of the coping skills such as fishing that he has done in the past.   We will continue to work on coping skills for helping with his anxiety and the progression of  his Lewy body dementia diagnosis as well as processing his grief. Mental Status Exam: Appearance:  Well Groomed  Behavior: Appropriate Motor: Pt. Reports some instability due to Lewey Body dementia Speech/Language:  Normal Rate Affect: Appropriate Mood: normal Thought process: normal Thought content:  WNL Sensory/Perceptual disturbances:  WNL Orientation: oriented to person, place, and situation Attention: Good Conc entration: Good Memory: Impaired short term Fund of knowledge:  Good Insight:  Good Judgment:  Good Impulse Control: Good  Risk Assessment: Danger to Self: No Self-injurious Behavior: No Danger to Others: No Duty to Warn:no Physical Aggression / Violence:No   Access to Firearms a concern: No  Gang Involvement:No  Treatment plan: Will employ cognitive behavioral therapy as well as grief and loss therapy for relief of an xiety and grief.  Goals of grief therapy or to have a healthier response to the loss of his daughter and less sadness as evidenced by patient report in therapy notes as well as to help him feel less overwhelmed by her loss.  Goals are to see a 50% reduction in grief symptoms over the next 6 months.  I will encourage the use of the patient telling his grief story about his daughter including encouraging him to bring pictures and memorabi lia to help process his feelings including any feelings of guilt associated with her loss.  We will use cognitive behavioral therapy to help identify and change anxiety producing thoughts and behavior patterns so as to improve his ability to better manage anxiety and stress, and manage thoughts and worrisome thinking contributing to anxiety.  We will also refresh and encourage dialectical behavior therapy distress  tolerance and mindfuln ess skills with the intention of reducing anxiety by 50% over the next 6 months. Progress: 30% Interventions: Cognitive Behavioral Therapy  Diagnosis:PTSD, Bi-Polar D/O  Plan: F/U/ appointments scheduled weekly.  Lorrene CHRISTELLA Hasten, [facility]                                                                                                                    Lorrene CHRISTELLA Hasten, [facility]               Lorrene CHRISTELLA Hasten, [facility]               Lorrene CHRISTELLA Hasten, [facility]  [facility] Counselor/Therapist Progress Note  Patient ID: Haruo Stepanek, MRN: [id number],    Date: 04/01/2024 This session was held via video teletherapy. The patient consented to the video teletherapy and was located in his home during this session. He is aware it is the responsibility of the patient to secure confidentiality on his end of the session. The provider was in a private  home office for the duration of this session.   The patient arrived on time for her Caregility session  Time Spent: 57 minutes, 2 PM to 2:57 PM  Treatment Type: Individual Therapy Reported Symptoms: anxiety, panic attacks The patient is still having significant back pain.  He cannot sit anywhere for very long and has to be careful where he sits.  There is still some unsteadiness although he did not report any falls over the pa st 2 weeks.  His biggest concern now is for his wife.  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  She  is meeting with her cardiologist again next week to see what possible treatment is for her.  He still thinks there may be some issue with her diaphragm but she is meeting with the pulmonologist also.  He is doing what he can to help her.  His daughter's  birthday is March 11 so he knows that is a difficult time for both he and his wife next week.  Typically they do something to remember her but because of the wife's current health issues they cannot go to [place] as they had planned so they are talking about what to do to remember and celebrate her birthday.  We will continue to work on coping skills for helping with his anxiety and the progression of his Lewy body dementia diag nosis as well as processing his grief. Mental Status Exam: Appearance:  Well Groomed  Behavior: Appropriate Motor: Pt. Reports some instability due to Lewey Body dementia Speech/Language:  Normal Rate Affect: Appropriate Mood: normal Thought process: normal Thought content:  WNL Sensory/Perceptual disturbances:  WNL Orientation: oriented to person, place, and situation Attention: Good Concentration: Good Memory: Im paired short term Fund of knowledge:  Good Insight:  Good Judgment:  Good Impulse Control: Good  Risk Assessment: Danger to Self: No Self-injurious Behavior: No Danger to Others: No Duty to Warn:no Physical Aggression / Violence:No  Access to Firearms a concern: No  Gang Involvement:No  Treatment plan: Will employ cognitive behavioral therapy as well as grief and loss therapy for relief of anxiety and grief.  Goals of  grief therapy or to have a healthier response to the loss of his daughter and less sadness as evidenced by patient report in therapy notes as well as to help him feel less overwhelmed by her loss.  Goals are to see a 50% reduction in grief symptoms over the next 6 months.  I will encourage the use of the patient telling his grief story about his daughter including  encouraging him to bring pictures and memorabilia to help process his fee lings including any feelings of guilt associated with her loss.  We will use cognitive behavioral therapy to help identify and change anxiety producing thoughts and behavior patterns so as to improve his ability to better manage anxiety and stress, and manage thoughts and worrisome thinking contributing to anxiety.  We will also refresh and encourage dialectical behavior therapy distress tolerance and mindfulness skills  with the intenti on of reducing anxiety by 50% over the next 6 months. Progress: 30% Interventions: Cognitive Behavioral Therapy  Diagnosis:PTSD, Bi-Polar D/O  Plan: F/U/ appointments scheduled weekly.  Lorrene CHRISTELLA Hasten, [facility]                                                                                                                    Lorrene CHRISTELLA Hasten, [facility]  [facility] Counselor/Therapist Progress Note  Patient ID: Sonya Pucci, MRN: [id number],    Date: 04/01/2024 This session was held via video teletherapy. The patient consented to the video teletherapy and was located in his home during this session. He is aware it is the responsibility of the patient to secure confidentiality on his end of the session. The provider was in a private home office for the duration of this session.   The patient arrived on time for her Caregility session  Time Spe nt: 57 minutes, 2 PM to 2:57 PM  Treatment Type: Individual Therapy Reported Symptoms: anxiety, panic attacks  The patient reported that he had not been sleeping well so he did speak with his psychiatrist about adjusting some meds.  Initially they adjusted too much up and he said he felt groggy for the entire next day but they have found a combination now where he feels like he is getting more rested and only feels a little drowsy s hortly after waking up..  There have been a couple times where he stumbled and one of the times he felt down but did not get hurt the other times he was able to catch himself.  He is trying to stay as active as he can walking daily and fishing with his wife when he can.  His biggest anxiety now is his wife has an upcoming surgery on her diaphragm in 2 weeks so he knows that will be a fairly significant recovery time but also sees the [facility] fficulties she is having now and wants her to get some relief..  His son and daughter-in-law will be supports for them also.  He continues to speak with friends and family members as  encouragement.  He also feels that he is grieving appropriately writing to his daughter on a regular basis about the things that are going on in his and his wife's lives.  We will continue to work on coping skills for helping with his anxiety and the prog ression of his Lewy body dementia diagnosis as well as processing his grief. Mental Status Exam: Appearance:  Well Groomed  Behavior: Appropriate Motor: Pt. Reports some instability due to Lewey Body dementia Speech/Language:  Normal Rate Affect: Appropriate Mood: normal Thought process: normal Thought content:  WNL Sensory/Perceptual disturbances:  WNL Orientation: oriented to person, place, and situation Attention:  Good Concentration: Good Memory: Impaired short term Fund of knowledge:  Good Insight:  Good Judgment:  Good Impulse Control: Good  Risk Assessment: Danger to Self: No Self-injurious Behavior: No Danger to Others: No Duty to Warn:no Physical Aggression / Violence:No  Access to Firearms a concern: No  Gang Involvement:No  Treatment plan: Will employ cognitive behavioral therapy as well as grief and loss therapy for r elief of anxiety and grief.  Goals of grief therapy or to have a healthier response to the loss of his daughter and less sadness as evidenced by patient report in therapy notes as well as to help him feel less overwhelmed by her loss.  Goals are to see a 50% reduction in grief symptoms over the next 6 months.  I will encourage the use of the patient telling his grief story about his daughter including encouraging him to bring pictures a nd memorabilia to help process his feelings including any feelings of guilt associated with her loss.  We will use cognitive behavioral therapy to help identify and change anxiety producing thoughts and behavior patterns so as to improve his ability to  better manage anxiety and stress, and manage thoughts and worrisome thinking contributing to anxiety.  We will also  refresh and encourage dialectical behavior therapy distress tolerance a nd mindfulness skills with the intention of reducing anxiety by 50% over the next 6 months. Progress: 30% Interventions: Cognitive Behavioral Therapy  Diagnosis:PTSD, Bi-Polar D/O  Plan: F/U/ appointments scheduled weekly.  Lorrene CHRISTELLA Hasten, [facility]                                                                                                                    Lorrene CHRISTELLA Hasten, [facility]               Lorrene CHRISTELLA Hasten, [facility]               Lorrene CHRISTELLA Hasten, [facility]               Lorrene CHRISTELLA Hasten, [facility]               Lorrene CHRISTELLA Hasten, [facility]               Lorrene CHRISTELLA Hasten, [facility]               Lorrene CHRISTELLA Hasten, [facility]               Lorrene CHRISTELLA Hasten, [facility]  [facility] Counselor/Therapist Progress Note  Patient ID: Easten Maceachern, MRN: [id number],    Date: 04/01/2024 This sess ion was held via video teletherapy. The patient consented to the video teletherapy and was located in his home during this session. He is aware it is the responsibility of the patient to secure confidentiality on his end of the session. The provider was in a private home office for the duration of this session.   The patient arrived on time for her Caregility session  Time Spent: 59 minutes, 2 PM to 2:59 PM  Treatment Type: Indiv idual Therapy Reported Symptoms: anxiety, panic attacks The patient said he jokingly said to his wife that he had not had any pain issues in the cervical area and then a few days ago he started having pain in his neck and shoulders.  He now sits related to degenerative disk and has some consistent pain somewhere in his spine with that but jokingly says it just depends on Wednesday which part of his body is decides to show up and hurt.   He is doing the best that he can.  He is still walking which he knows is good for him but does not have right now the strength to do as much in the morning as he has done in the past so he is denying that to 3 times a day for consistency.  He is still doing some positive things and that he is staying in touch with his brother and other family members.  He is reconnecting with friends from high school and he is which she is enjoying.   One of them has had a similar experience in that she lost her son in the same way the patient lost his daughter.  He said they did not think and Easter in which they set a place for his  daughter at the table and put her picture there.  He initially had reservations about doing that he and his wife had made everyone feel as if she was really there with him.  He still feels that he is grieving in a healthy way.  He and his wife have alway s taken a trip around both of their birthdays which are 2 days apart.  That is now close to his daughter died so they have started taking some of her rashes with him and sprinkling them where they go as a way of remembrance.  They are deciding what that looks like this year in July.  He does contract for safety having no thoughts of hurting himself or anyone else. We will continue to work on coping skills for helping with his anxiety  and the progression of his Lewy body dementia diagnosis as well as processing his grief. Mental Status Exam: Appearance:  Well Groomed  Behavior: Appropriate Motor: Pt. Reports some instability due to Lewey Body dementia Speech/Language:  Normal Rate Affect: Appropriate Mood: normal Thought process: normal Thought content:  WNL Sensory/Perceptual disturbances:  WNL Orientation: oriented to person, place, and situatio n Attention: Good Concentration: Good Memory: Impaired short term Fund of knowledge:  Good Insight:  Good Judgment:  Good Impulse Control: Good  Risk Assessment: Danger to Self: No Self-injurious Behavior: No Danger to Others: No Duty to Warn:no Physical Aggression / Violence:No  Access to Firearms a concern: No  Gang Involvement:No  Treatment plan: Will employ cognitive behavioral therapy as well as grief and loss  therapy for relief of anxiety and grief.  Goals of grief therapy  or to have a healthier response to the loss of his daughter and less sadness as evidenced by patient report in therapy notes as well as to help him feel less overwhelmed by her loss.  Goals are to see a 50% reduction in grief symptoms over the next 6 months.  I will encourage the use of the patient  telling his grief story about his daughter including encouraging him to bri ng pictures and memorabilia to help process his feelings including any feelings of guilt associated with her loss.  We will use cognitive behavioral therapy to help identify and change anxiety producing thoughts and behavior patterns so as to improve his ability to better manage anxiety and stress, and manage thoughts and worrisome thinking contributing to anxiety.  We will also refresh and encourage dialectical behavior therapy distres s tolerance and mindfulness skills with the intention of reducing anxiety by 50% over the next 6 months. Progress: 30% Interventions: Cognitive Behavioral Therapy  Diagnosis:PTSD, Bi-Polar D/O  Plan: F/U/ appointments scheduled weekly.  Lorrene CHRISTELLA Hasten, [facility]                                                                                                                    Lorrene CHRISTELLA Hasten, [facility]  [facility] Counselor Marisa Progress Note  Patient ID: Romar Woodrick, MRN: [id number],    Date: 04/01/2024 This session was held via video teletherapy. The patient consented to the video teletherapy and was located in his home during this session. He is aware it is the responsibility of the patient to secure confidentiality on his end of the session. The provider was in a private home office for the duration of this session.   The patient arr ived on time for her Caregility session  Time Spent: 58 minutes, 2 PM to 2:58 PM  Treatment Type: Individual Therapy Reported Symptoms: anxiety, panic attacks A fairly difficult week because it was his daughter's birthday.  He said this year was more difficult than last year but he could not pinpoint why.  They did remember her about going out to a nice restaurant which is something her daughter always wanted to do.  He said they w ere able to laugh and joke about funny things that his daughter had said or done which is not something they were able to do this time last year so he saw that as healthy grieving.  He knows that the time change threw things off a little bit for everybody and his wife still is not feeling well and his back has been bothering him.  He was not able to go to his first round of physical therapy this week because his back was bothering him m ore.  He has been thinking a lot more about his daughter which he feels comfortable with.  He is thankful that it is getting warmer and staying light longer so he can get back to some  of the coping skills such as fishing that he has done in the past.   We will continue to work on coping skills for helping with his anxiety and the progression of his Lewy body dementia diagnosis as well as processing his grief. Mental Status Exam: Ap pearance:  Well Groomed  Behavior: Appropriate Motor: Pt. Reports some instability due to Lewey Body dementia Speech/Language:  Normal Rate Affect: Appropriate Mood: normal Thought process: normal Thought content:  WNL Sensory/Perceptual disturbances:  WNL Orientation: oriented to person, place, and situation Attention: Good Concentration: Good Memory: Impaired short term Fund of knowledge:  Good Insight:  Good  Judgment:  Good Impulse Control: Good  Risk Assessment: Danger to Self: No Self-injurious Behavior: No Danger to Others: No Duty to Warn:no Physical Aggression / Violence:No  Access to Firearms a concern: No  Gang Involvement:No  Treatment plan: Will employ cognitive behavioral therapy as well as grief and loss therapy for relief of anxiety and grief.  Goals of grief therapy or to have a healthier response to the loss of hi s daughter and less sadness as evidenced by patient report in therapy notes as well as to help him feel less overwhelmed by her loss.  Goals are to see a 50% reduction in grief symptoms over the next 6 months.  I will encourage the use of the patient telling his grief story about his daughter including encouraging him to bring pictures and memorabilia to help process his feelings including any feelings of guilt associated with her loss.   We will use cognitive behavioral therapy to help identify and change anxiety producing thoughts and behavior patterns so as to improve his ability to better manage anxiety and stress, and manage thoughts and worrisome thinking contributing to anxiety.  We will also refresh and encourage dialectical behavior therapy distress  tolerance and mindfulness skills with the  intention of reducing anxiety by 50% over the next 6 months. Progress : 30% Interventions: Cognitive Behavioral Therapy  Diagnosis:PTSD, Bi-Polar D/O  Plan: F/U/ appointments scheduled weekly.  Lorrene CHRISTELLA Hasten, [facility]                                                                                                                   Lorrene CHRISTELLA Hasten, [facility]               Lorrene CHRISTELLA Hasten, [facility]               Lorrene CHRISTELLA Hasten, [facility]  [facility] Counselor/Therapist Progress Note  Patien t ID: Vaishnav Demartin, MRN: [id number],    Date: 04/01/2024 This session was held via video teletherapy. The  patient consented to the video teletherapy and was located in his home during this session. He is aware it is the responsibility of the patient to secure confidentiality on his end of the session. The provider was in a private home office for the duration of this session.   The patient arrived on time for her Caregility se ssion  Time Spent: 1:05 PM until 2 PM, 55 minutes Treatment Type: Individual Therapy Reported Symptoms: anxiety, panic attacks The patient said he came in from his walk this morning and saw what he thought was some sort of tic from his wife.  It was not the first time it happened and he wonders if it might be tardive dyskinesia because of some new medications that she is on.  She has a call into the doctor right now and hopes to he ar something back soon.  They have narrowed down to the fact they think most of her discomfort in breathing is coming from the diaphragm and they are looking for alternatives for treatment.  The patient continues to do those things which help him cope including walking, fishing when he can, spending time with his son and daughter-in-law and grandson.  He also has a way of continued coping with grief he is writing letters to his daught ers as a way of expressing his feelings and says that is very beneficial.  He appears to be grieving in a healthy way but he knows that he to year anniversary of her death is coming up and there is some grief there but says it is not overwhelming.  We will continue to work on coping skills for helping with his anxiety and the progression of his Lewy body dementia diagnosis as well as processing his grief. Mental Status Exam: Appeara nce:  Well Groomed  Behavior: Appropriate Motor: Pt. Reports some instability due to Lewey Body dementia Speech/Language:  Normal Rate Affect: Appropriate Mood: normal Thought process: normal Thought content:  WNL Sensory/Perceptual disturbances:  WNL Orientation: oriented to  person, place, and situation Attention: Good Concentration: Good Memory: Impaired short term Fund of knowledge:  Good Insight:  Good Judgm ent:  Good Impulse Control: Good  Risk Assessment: Danger to Self: No Self-injurious Behavior: No Danger to Others: No Duty to Warn:no Physical Aggression / Violence:No  Access to Firearms a concern: No  Gang Involvement:No  Treatment plan: Will employ cognitive behavioral therapy as well as grief and loss therapy for relief of anxiety and grief.  Goals of grief therapy or to have a healthier response to the loss of his dau ghter and less sadness as evidenced by patient report in therapy notes as well as to help him feel less overwhelmed by her loss.  Goals are to see a 50% reduction in grief symptoms over the next 6 months.  I will encourage the use of the patient telling his grief story about his daughter including encouraging him to bring pictures and memorabilia to help process his feelings including any feelings of guilt associated with her loss.  We  will use cognitive behavioral therapy to help identify and change anxiety producing thoughts and behavior patterns so as to improve his ability to better manage anxiety and stress, and manage thoughts and worrisome thinking contributing to anxiety.  We will also refresh and encourage dialectical behavior therapy distress tolerance  and mindfulness skills with the intention of reducing anxiety by 50% over the next 6 months. Progress: 30%  Interventions: Cognitive Behavioral Therapy  Diagnosis:PTSD, Bi-Polar D/O  Plan: F/U/ appointments scheduled weekly.  Lorrene CHRISTELLA Hasten, [facility]                                                                                                                   Lorrene CHRISTELLA Hasten, [facility]  [place] [facility] Counselor/Therapist Progress Note  Patient ID: Malique Driskill, MRN: [id number],    Date: 04/01/2024 This session was held  via video teletherapy. The patient consented to the video teletherapy and was located in his home during this session. He is aware it is the responsibility of the patient to secure confidentiality on his end of the session. The provider was in a private home office for the duration of this session.   The patient arrived on time for her Caregility session  Time Spent: 59 minutes, 2 PM to 2:59 PM  Treatment Type: Individual Therapy  Reported Symptoms: anxiety, panic attacks The patient continues to present with physical pain and both his back and his knee.  He started with a new physical therapist and told him  that there was a lot of arthritis in both knees and or certain things he cannot do but they had him do that anyway and now for the past 3 days he has been in significant knee pain.  He has continued to walk because he knows that is good for him in various  ways but says it has been painful.  His wife also was not feeling well but she is going back to the doctor next week to have an ablation and hopes that will bring her some relief.  For the most part he has been at the last couple weeks reaching back out and hearing from some old friends as well as time with his son and son's family.  He says that his wife's cousin is coming down for a weekend soon and they enjoy time with her and that h e will go to his brother's house sometime in the next few weeks.  He recognizes the need for people that he cares about in helping with his depression. We will continue to work on coping skills for helping with his anxiety and the progression of his Lewy body dementia diagnosis as well as processing his grief. Mental Status Exam: Appearance:  Well Groomed  Behavior: Appropriate Motor: Pt. Reports some instability due to Lewey B ody dementia Speech/Language:  Normal Rate Affect: Appropriate Mood: normal Thought process: normal Thought content:  WNL Sensory/Perceptual disturbances:  WNL Orientation: oriented to person, place, and situation Attention: Good Concentration: Good Memory: Impaired short term Fund of knowledge:  Good Insight:  Good Judgment:  Good Impulse Control: Good  Risk Assessment: Danger to Self: No Self-injurious Behavi or: No Danger to Others: No Duty to Warn:no Physical Aggression / Violence:No  Access to Firearms a concern: No  Gang Involvement:No  Treatment plan: Will employ cognitive behavioral therapy as well as grief and loss therapy for relief of anxiety and grief.  Goals of grief therapy or to have a healthier response to the loss of his daughter and less sadness as  evidenced by patient report in therapy notes as well as to help him feel  less overwhelmed by her loss.  Goals are to see a 50% reduction in grief symptoms over the next 6 months.  I will encourage the use of the patient telling his grief story about his daughter including encouraging him to bring pictures and memorabilia to help process his feelings including any feelings of guilt associated with her loss.  We will use cognitive behavioral therapy to help identify and change anxiety producing thoughts and b ehavior patterns so as to improve his ability to better manage anxiety and stress, and manage thoughts and worrisome thinking contributing to anxiety.  We will  also refresh and encourage dialectical behavior therapy distress tolerance and mindfulness skills with the intention of reducing anxiety by 50% over the next 6 months. Progress: 30% Interventions: Cognitive Behavioral Therapy  Diagnosis:PTSD, Bi-Polar D/O  Plan: F/U/ appoin tments scheduled weekly.  Lorrene CHRISTELLA Hasten, [facility]                                                                                                                   Lorrene CHRISTELLA Hasten, [facility]               Lorrene CHRISTELLA Hasten, [facility]               Lorrene CHRISTELLA Hasten, [facility]               Lorrene CHRISTELLA Hasten, [facility]  [place] [place] [facility] Counselor/Therapist Progress Note  Patient ID: Aqeel Norgaard, MRN: [id number],     Date: 04/01/2024 This session was held via video teletherapy. The patient consented to the video teletherapy and was located in his home during this session. He is aware it is the responsibility of the patient to secure confidentiality on his end of the session. The provider was in a private home office for the duration of this session.   The patient arrived on time for her Caregility session  Time Spent: 57 minutes, 2 PM to  2:57 PM  Treatment Type: Individual Therapy Reported Symptoms: anxiety, panic attacks The patient is still having significant back pain.  He cannot sit anywhere for very long and has to be careful where he sits.  There is still some unsteadiness although he did not report any falls over the past 2 weeks.  His biggest concern now is for his wife.  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  She is meeting with her cardiologist again next week to see what possible treatment is for her.  He still thinks there may be some issue with her  diaphragm but she is meeting with the pulmonologist also.  He is doing what he can to help her.  His daughter's birthday is March 11 so he knows that is a difficult time for both he and his wife next week.  Typically they do something to remember her but  because of the wife's current health issues they cannot go to [place] as they had planned so they are talking about what to do to remember and celebrate her birthday.  We will continue to work on coping skills for helping with his anxiety and the progression of his Lewy body dementia diagnosis as well as processing his grief. Mental Status Exam: Appearance:  Well Groomed  Behavior: Appropriate Motor: Pt. Reports some in stability due to Lewey Body dementia Speech/Language:  Normal Rate Affect: Appropriate Mood: normal Thought process: normal Thought content:  WNL Sensory/Perceptual disturbances:  WNL Orientation: oriented to person, place, and situation Attention: Good Concentration: Good Memory: Impaired short term Fund of knowledge:  Good Insight:  Good Judgment:  Good Impulse Control: Good  Risk Assessment: Danger to Self: N o Self-injurious Behavior: No Danger to Others: No Duty to Warn:no Physical Aggression / Violence:No  Access to Firearms a concern: No  Gang Involvement:No  Treatment plan: Will employ cognitive behavioral therapy as well as grief and loss therapy for relief of anxiety and grief.  Goals of grief therapy or to have a healthier response to the loss of his daughter and less sadness as evidenced by patient report in therapy notes as  well as to help him feel less overwhelmed by her loss.  Goals are to see a 50% reduction in grief symptoms over the next 6 months.  I will encourage the use of the patient telling his grief story about his daughter including encouraging him to bring pictures and memorabilia to help process his feelings including any feelings of guilt associated with her loss.  We  will use cognitive behavioral therapy to help identify and change anxiety  producing thoughts and behavior patterns so as to improve his ability to better manage anxiety and stress, and manage thoughts and worrisome thinking contributing to anxiety.  We will also refresh and encourage dialectical behavior therapy distress tolerance and mindfulness  skills with the intention of reducing anxiety by 50% over the next 6 months. Progress: 30% Interventions: Cognitive Behavioral Therapy  Diagnosis:PTSD, Bi-Polar  D/O  Plan: F/U/ appointments scheduled weekly.  Lorrene CHRISTELLA Hasten, [facility]                                                                                                                   Lorrene CHRISTELLA Hasten, [facility]  [place] [facility] Counselor/Therapist Progress Note  Patient ID: Berthold Glace, MRN: [id number],    Date: 04/01/2024 This session was held via video teletherapy. The patient consented to the video teletherapy and  was located in his home during this session. He is aware it is the responsibility of the patient to secure confidentiality on his end of the session. The provider was in a private home office for the duration of this session.   The patient arrived on time for her Caregility session  Time Spent: 58 minutes, 2 PM to 2:58 PM  Treatment Type: Individual Therapy Reported Symptoms: anxiety, panic attacks A fairly difficult week becau se it was his daughter's birthday.  He said this year was more difficult than last year but he could not pinpoint why.  They did remember her about going out to a nice restaurant which is something her daughter always wanted to do.  He said they were able to laugh and joke about funny things that his daughter had said or done which is not something they were able to do this time last year so he saw that as healthy grieving.  He knows th at the time change threw things off a little bit for everybody and his wife still is not feeling well and his back has been bothering him.  He was not able to go to his first round of physical therapy this week because his back was bothering him more.  He has been thinking a lot more about his daughter which he feels comfortable with.  He is thankful that it is getting warmer and staying light longer so he can get back to some of the co ping skills such as fishing that he has done in the past.   We will continue to work on coping skills for helping with his anxiety and the progression of his Lewy body dementia  diagnosis as well as processing his grief. Mental Status Exam: Appearance:  Well Groomed  Behavior: Appropriate Motor: Pt. Reports some instability due to Lewey Body dementia Speech/Language:  Normal Rate Affect: Appropriate Mood: normal Thought pr ocess: normal Thought content:  WNL Sensory/Perceptual disturbances:  WNL Orientation: oriented to person, place, and situation Attention: Good Concentration: Good Memory: Impaired short term Fund of knowledge:  Good Insight:  Good Judgment:  Good Impulse Control: Good  Risk Assessment: Danger to Self: No Self-injurious Behavior: No Danger to Others: No Duty to Warn:no Physical Aggression / Violence:No  Access  to Firearms a concern: No  Gang Involvement:No  Treatment plan: Will employ cognitive behavioral therapy as well as grief and loss therapy for relief of anxiety and grief.  Goals of grief therapy or to have a healthier response to the loss of his daughter and less sadness as evidenced by patient report in therapy notes as well as to help him feel less overwhelmed by her loss.  Goals are to see a 50% reduction in grief symptoms over th e next 6 months.  I will encourage the use of the patient telling his grief story about his daughter including encouraging him to bring pictures and memorabilia to help process his feelings including any feelings of guilt associated with her loss.  We will use cognitive behavioral therapy to help identify and change anxiety producing thoughts and behavior patterns so as to improve his ability to better manage anxiety and stress, and man age thoughts and worrisome thinking contributing to anxiety.  We will also refresh and encourage dialectical behavior therapy distress  tolerance and mindfulness skills with the intention of reducing anxiety by 50% over the next 6 months. Progress: 30% Interventions: Cognitive Behavioral Therapy  Diagnosis:PTSD, Bi-Polar D/O  Plan: F/U/ appointments  scheduled weekly.  Lorrene CHRISTELLA Hasten, [facility]                                                                                                                    Lorrene CHRISTELLA Hasten, [facility]               Lorrene CHRISTELLA Hasten, [facility]               Lorrene CHRISTELLA Hasten, [facility]  [facility] Counselor/Therapist Progress Note  Patient ID: Jarrad Mclees, MRN: [id number],    Date: 04/01/2024 This session was held via video teletherapy. The patient consented to the video teletherapy and was located in his hom e during this session. He is aware it is the responsibility of the patient to secure confidentiality  on his end of the session. The provider was in a private home office for the duration of this session.   The patient arrived on time for her Caregility session  Time Spent: 57 minutes, 2 PM to 2:57 PM  Treatment Type: Individual Therapy Reported Symptoms: anxiety, panic attacks The patient is still having significant back pain.   He cannot sit anywhere for very long and has to be careful where he sits.  There is still some unsteadiness although he did not report any falls over the past 2 weeks.  His biggest concern now is for his wife.  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  She is meeting with her cardiologist again next week to see what possible treatment is for her.  He sti ll thinks there may be some issue with her diaphragm but she is meeting with the pulmonologist also.  He is doing what he can to help her.  His daughter's birthday is March 11 so he knows that is a difficult time for both he and his wife next week.  Typically they do something to remember her but because of the wife's current health issues they cannot go to [place] as they had planned so they are talking about what to do to reme mber and celebrate her birthday.  We will continue to work on coping skills for helping with his anxiety and the progression of his Lewy body dementia diagnosis as well as processing his grief. Mental Status Exam: Appearance:  Well Groomed  Behavior: Appropriate Motor: Pt. Reports some instability due to Lewey Body dementia Speech/Language:  Normal Rate Affect: Appropriate Mood: normal Thought process: normal Thought cont ent:  WNL Sensory/Perceptual disturbances:  WNL Orientation: oriented to person, place, and situation Attention: Good Concentration: Good Memory: Impaired short term Fund of knowledge:  Good Insight:  Good Judgment:  Good Impulse Control: Good  Risk Assessment: Danger  to Self: No Self-injurious Behavior: No Danger to Others: No Duty to Warn:no Physical Aggression / Violence:No  Access to Firearms a concern: No   Gang Involvement:No  Treatment plan: Will employ cognitive behavioral therapy as well as grief and loss therapy for relief of anxiety and grief.  Goals of grief therapy or to have a healthier response to the loss of his daughter and less sadness as evidenced by patient report in therapy notes as well as to help him feel less overwhelmed by her loss.  Goals are to see a 50% reduction in grief symptoms over the next 6 months.  I will en courage the use of the patient telling his grief story about his daughter including encouraging him to bring pictures and memorabilia to help process his feelings including any feelings of guilt associated with her loss.  We will use cognitive behavioral therapy to help identify and change anxiety producing thoughts and behavior patterns so as to improve his ability to better manage anxiety and stress, and manage thoughts and worrisome  thinking contributing to anxiety.  We will also refresh and encourage dialectical behavior therapy distress tolerance and mindfulness  skills with the intention of reducing anxiety by 50% over the next 6 months. Progress: 30% Interventions: Cognitive Behavioral Therapy  Diagnosis:PTSD, Bi-Polar D/O  Plan: F/U/ appointments scheduled weekly.  Lorrene CHRISTELLA Hasten, [facility]                                                                                                                    Lorrene CHRISTELLA Hasten, [facility]  [facility] Counselor/Therapist Progress Note  Patient ID: Lamario Mani, MRN: [id number],    Date: 04/01/2024 This session was held via video teletherapy. The patient consented to the video teletherapy and was located in his home during this session. He is aware it is the responsibility of the patient to secure confidentiality on his end of the  session. The provider was in a private home office for the duration of this session.   The patient arrived on time for her Caregility session  Time Spent: 57 minutes, 2 PM to 2:57 PM  Treatment Type: Individual Therapy Reported Symptoms: anxiety, panic attacks  We will continue to work on coping skills for helping with his anxiety and the progression of his Lewy body dementia diagnosis as well as processing his grief. Mental  Status Exam: Appearance:  Well Groomed  Behavior: Appropriate Motor: Pt. Reports some instability due to Lewey Body dementia Speech/Language:  Normal  Rate Affect: Appropriate Mood: normal Thought process: normal Thought content:  WNL Sensory/Perceptual disturbances:  WNL Orientation: oriented to person, place, and situation Attention: Good Concentration: Good Memory: Impaired short term Fund of knowledge:  Good In sight:  Good Judgment:  Good Impulse Control: Good  Risk Assessment: Danger to Self: No Self-injurious Behavior: No Danger to Others: No Duty to Warn:no Physical Aggression / Violence:No  Access to Firearms a concern: No  Gang Involvement:No  Treatment plan: Will employ cognitive behavioral therapy as well as grief and loss therapy for relief of anxiety and grief.  Goals of grief therapy or to have a healthier response t o the loss of his daughter and less sadness as evidenced by patient report in therapy notes as well as to help him feel less overwhelmed by her loss.  Goals are to see a 50% reduction in grief symptoms over the next 6 months.  I will encourage the use of the patient telling his grief story about his daughter including encouraging him to bring pictures and memorabilia to help process his feelings including any feelings of guilt associate d with her loss.  We will use cognitive behavioral therapy to help identify and change anxiety producing thoughts and behavior patterns so as to improve his ability to better manage anxiety and stress, and manage thoughts and worrisome thinking contributing to anxiety.  We will also refresh and encourage dialectical behavior therapy distress tolerance and mindfulness skills with the intention of reducing anxiety by 50% over the next 6 m onths. Progress: 30% Interventions: Cognitive Behavioral Therapy  Diagnosis:PTSD, Bi-Polar D/O  Plan: F/U/ appointments scheduled weekly.  Lorrene CHRISTELLA Hasten,  [facility]                                                                                                                   Lorrene CHRISTELLA Hasten, [facility]                Lorrene CHRISTELLA Hasten, [facility]               Lorrene CHRISTELLA Hasten, [facility]  Lorrene CHRISTELLA Hasten, [facility]               Lorrene CHRISTELLA Hasten, [facility]               Lorrene CHRISTELLA Hasten, [facility]               Lorrene CHRISTELLA Hasten, [facility]  [facility] Counselor/Therapist Progress Note  Patient ID: Alik Mawson, MRN: [id number],    Date: 04/01/2024 This session was held via video teletherapy. The  patient consented to the video teletherapy and was located in his home during this session. He i s aware it is the responsibility of the patient to secure confidentiality on his end of the session. The provider was in a private home office for the duration of this session.   The patient arrived on time for her Caregility session  Time Spent: 39 minutes, 2 PM to 2:39 PM  Treatment Type: Individual Therapy Reported Symptoms: anxiety, panic attacks The patient was not feeling well today.  He started feeling really earlier in  the week having a fever bad headaches body aches.  He felt a little better yesterday but feels a little bit of a resurgence in headaches and body aches and is running a low-grade fever.  His wife is feeling some of the same symptoms.  For the most part he says he is doing well.  They did increase his memory medication because they were starting to see some increasing decline in short-term memory and he said until recently his long-term  memory had been good but he was starting to see a difference in how he used to markers to remember what happened at certain times historically.  He feels the medication has helped and has brightened his mood a little also.  He is still trying to walk every day and fish when he can and has enjoyed watching the [employer] as a good distraction.  He reports that he feels he is managing mood fairly well.  He has an appointment  in 2 weeks.  He does contract for safety having no thoughts of hurting himself or anyone else. We will continue to work on coping skills for helping with his anxiety and the progression of his Lewy body dementia diagnosis as well as processing his grief. Mental Status Exam: Appearance:  Well Groomed  Behavior: Appropriate Motor: Pt. Reports some instability due to Lewey Body dementia Speech/Language:  Normal Rate Affect: Ap propriate Mood: normal Thought process: normal Thought content:   WNL Sensory/Perceptual disturbances:  WNL Orientation: oriented to person, place, and situation Attention: Good Concentration: Good Memory: Impaired short term Fund of knowledge:  Good Insight:  Good Judgment:  Good Impulse Control: Good  Risk Assessment: Danger to Self: No Self-injurious Behavior: No Danger to Others: No Duty to Warn:no Physical  Aggression / Violence:No  Access to Firearms a concern: No  Gang Involvement:No  Treatment plan: Will employ cognitive behavioral therapy as well as grief and loss therapy for relief of anxiety and grief.  Goals of grief therapy or to have a healthier response to the loss of his daughter and less sadness as evidenced by patient report in therapy notes as well as to help him feel less overwhelmed by her loss.  Goals are to see a 50%  reduction in grief symptoms over the next 6 months.  I will encourage the use of the patient telling his grief story about his daughter including encouraging him to bring pictures and memorabilia to help process his feelings including any feelings of guilt associated with her loss.  We will use cognitive behavioral therapy to help identify and change anxiety producing thoughts and behavior patterns so as to improve his ability to better  manage anxiety and stress, and manage thoughts and worrisome thinking contributing to anxiety.  We will also refresh  and encourage dialectical behavior therapy distress tolerance and mindfulness skills with the intention of reducing anxiety by 50% over the next 6 months. Progress: 30% Interventions: Cognitive Behavioral Therapy  Diagnosis:PTSD, Bi-Polar D/O  Plan: F/U/ appointments scheduled weekly.  Lorrene CHRISTELLA Hasten, [facility]                                                                                                                    Lorrene CHRISTELLA Hasten,  [facility]  [facility] Counselor/Therapist Progress Note  Patient ID: Hamid Brookens, MRN: [id number],    Date: 04/01/2024 This session was held via video teletherapy. The patient consented to the video teletherapy and was located in his home during this session. He is aware it is the responsibili ty of the patient to secure confidentiality on his end of the session. The provider was in a private home office for the duration of this session.   The patient arrived on time for her Caregility session  Time Spent: 58 minutes, 2 PM to 2:58 PM  Treatment Type: Individual Therapy Reported Symptoms: anxiety, panic attacks A fairly difficult week because it was his daughter's birthday.  He said this year was more  difficult than l ast year but he could not pinpoint why.  They did remember her about going out to a nice restaurant which is something her daughter always wanted to do.  He said they were able to laugh and joke about funny things that his daughter had said or done which is not something they were able to do this time last year so he saw that as healthy grieving.  He knows that the time change threw things off a little bit for everybody and his wife sti ll is not feeling well and his back has been bothering him.  He was not able to go to his first round of physical therapy this week because his back was bothering him more.  He has been thinking a lot more about his daughter which he feels comfortable with.  He is thankful that it is getting warmer and staying light longer so he can get back to some of the coping skills such as fishing that he has done in the past.   We will continue  to work on coping skills for helping with his anxiety and the progression of his Lewy body dementia diagnosis as well as processing his grief. Mental Status Exam: Appearance:  Well Groomed  Behavior: Appropriate Motor: Pt. Reports some instability due to Lewey Body dementia Speech/Language:  Normal Rate Affect: Appropriate Mood: normal Thought process: normal Thought content:  WNL Sensory/Perceptual disturbances:  WNL  Orientation: oriented to person, place, and situation Attention: Good Concentration: Good Memory: Impaired short term Fund of knowledge:  Good Insight:  Good Judgment:  Good Impulse Control: Good  Risk Assessment: Danger to Self: No Self-injurious Behavior: No Danger to Others: No Duty to Warn:no Physical Aggression / Violence:No  Access to Firearms a concern: No  Gang Involvement:No  Treatment plan: Will employ c ognitive behavioral therapy as well as grief and loss therapy for relief of anxiety and grief.  Goals of grief therapy or to have a healthier response to the loss of  his daughter and less sadness as evidenced by patient report in therapy notes as well as to help him feel less overwhelmed by her loss.  Goals are to see a 50% reduction in grief symptoms over the next 6 months.  I will encourage the use of the patient telling his grief sto ry about his daughter including encouraging him to bring pictures and memorabilia to help process his feelings including any feelings of guilt associated with her loss.  We will use cognitive behavioral therapy to help identify and change anxiety producing thoughts and behavior patterns so as to improve his ability to better manage anxiety and stress, and manage thoughts and worrisome thinking contributing to anxiety.  We will also refr esh and encourage dialectical behavior therapy  distress tolerance and mindfulness skills with the intention of reducing anxiety by 50% over the next 6 months. Progress: 30% Interventions: Cognitive Behavioral Therapy  Diagnosis:PTSD, Bi-Polar D/O  Plan: F/U/ appointments scheduled weekly.  Lorrene CHRISTELLA Hasten, [facility]                                                                                                                    Lorrene CHRISTELLA Hasten, [facility]               Lorrene CHRISTELLA Hasten, [facility]               Lorrene CHRISTELLA Hasten, [facility]  [facility] Counselor/Therapist Progress Note  Patient ID: Vedder Brittian, MRN: [id number],    Date: 04/01/2024 This session was held via video teletherapy. The patient consented to the video teletherapy and was located in his home during this session. He is aware it is the responsibility of the patient to secure confid entiality on his end of the session. The provider was in a private home office for the duration of this session.   The patient arrived on time for her Caregility session  Time Spent: 1:05 PM until 2 PM, 55 minutes Treatment Type: Individual Therapy Reported Symptoms: anxiety, panic attacks The patient said he came in from his walk this morning and saw what he thought was some sort of tic from his wife.  It was not the first tim e it happened and he wonders if it might be tardive dyskinesia because of some new medications that she is on.  She has a call into the doctor right now and hopes to hear something back  soon.  They have narrowed down to the fact they think most of her discomfort in breathing is coming from the diaphragm and they are looking for alternatives for treatment.  The patient continues to do those things which help him cope including walking,  fishing when he can, spending time with his son and daughter-in-law and grandson.  He also has a way of continued coping with grief he is writing letters to his daughters as a way of expressing his feelings and says that is very beneficial.  He appears to be grieving in a healthy way but he knows that he to year anniversary of her death is coming up and there is some grief there but says it is not overwhelming.  We will continue to w ork on coping skills for helping with his anxiety and the progression of his Lewy body dementia diagnosis as well as processing his grief. Mental Status Exam: Appearance:  Well Groomed  Behavior: Appropriate Motor: Pt. Reports some instability due to Lewey Body dementia Speech/Language:  Normal Rate Affect: Appropriate Mood: normal Thought process: normal Thought content:  WNL Sensory/Perceptual disturbances:  WNL Ori entation: oriented to person, place, and situation Attention: Good Concentration: Good Memory: Impaired short term Fund of knowledge:  Good Insight:  Good Judgment:  Good Impulse Control: Good  Risk Assessment: Danger to Self: No Self-injurious Behavior: No Danger to Others: No Duty to Warn:no Physical Aggression / Violence:No  Access to Firearms a concern: No  Gang Involvement:No  Treatment plan: Will employ cognit ive behavioral therapy as well as grief and loss therapy for relief of anxiety and grief.  Goals of grief therapy or to have a healthier response to the loss of his daughter and less sadness as evidenced by patient report in therapy notes as well as to help him feel less overwhelmed by her loss.  Goals are to see a 50% reduction in grief symptoms over the next 6  months.  I will encourage the use of the patient telling his grief story ab out his daughter including encouraging him to bring pictures and memorabilia to help process his feelings including any feelings of guilt associated with her loss.  We will use cognitive behavioral therapy to help identify and change anxiety producing thoughts and behavior patterns so as to improve his ability to better manage anxiety and stress, and manage thoughts and worrisome thinking contributing to anxiety.  We will also refresh a nd encourage dialectical behavior therapy distress tolerance  and mindfulness skills with the intention of reducing anxiety by 50% over the next 6 months. Progress: 30% Interventions: Cognitive Behavioral Therapy  Diagnosis:PTSD, Bi-Polar D/O  Plan: F/U/ appointments scheduled weekly.  Lorrene CHRISTELLA Hasten, [facility]                                                                                                                    Lorrene CHRISTELLA Hasten, [facility]   Behavioral Health Counselor/Therapist Progress Note  Patient ID: Ahmet Schank, MRN: [id number],    Date: 04/01/2024 This session was held via video teletherapy. The patient consented to the video teletherapy and was located in his home during this session. He is aware it is the responsibility of the patient to secure confidentiality on his end of the session. The provider was in a private home office for th e duration of this session.   The patient arrived on time for her Caregility session  Time Spent: 59 minutes, 2 PM to 2:59 PM  Treatment Type: Individual Therapy Reported Symptoms: anxiety, panic attacks The patient continues to present with physical pain and both his back and his knee.  He started with a new physical therapist and told him that there was a lot of arthritis in both knees and or certain things he cannot do but t hey had him do that anyway and now for the past 3 days he has been in significant knee pain.  He has continued to walk because he knows that is good for him in various ways but says it has been painful.  His wife also was not feeling well but she is going back to the doctor next week to have an ablation and hopes that will bring her some relief.  For the most part he has been at the last couple weeks reaching back out and hearing from s ome old friends as well as time with his son and son's family.  He says that his wife's cousin is coming down for a weekend soon and they enjoy time with her and that he will go to his  brother's house sometime in the next few weeks.  He recognizes the need for people that he cares about in helping with his depression. We will continue to work on coping skills for helping with his anxiety and the progression of his Lewy body dementia di agnosis as well as processing his grief. Mental Status Exam: Appearance:  Well Groomed  Behavior: Appropriate Motor: Pt. Reports some instability due to Lewey Body dementia Speech/Language:  Normal Rate Affect: Appropriate Mood: normal Thought process: normal Thought content:  WNL Sensory/Perceptual disturbances:  WNL Orientation: oriented to person, place, and situation Attention: Good Concentration: Good Memory:  Impaired short term Fund of knowledge:  Good Insight:  Good Judgment:  Good Impulse Control: Good  Risk Assessment: Danger to Self: No Self-injurious Behavior: No Danger to Others: No Duty to Warn:no Physical Aggression / Violence:No  Access to Firearms a concern: No  Gang Involvement:No  Treatment plan: Will employ cognitive behavioral therapy as well as grief and loss therapy for relief of anxiety and grief.  Goals o f grief therapy or to have a healthier response to the loss of his daughter and less sadness as evidenced by patient report in therapy notes as well as to help him feel less overwhelmed by her loss.  Goals are to see a 50% reduction in grief symptoms over the next 6 months.  I will encourage the use of the patient telling his grief story about his daughter including encouraging him to bring pictures and memorabilia to help process his f eelings including any feelings of guilt associated with her loss.  We will use cognitive behavioral therapy to help identify and change anxiety producing thoughts and behavior patterns so as to improve his ability to better manage anxiety and stress, and manage thoughts and worrisome thinking contributing to anxiety.  We will also refresh  and encourage dialectical  behavior therapy distress tolerance and mindfulness skills with the inten tion of reducing anxiety by 50% over the next 6 months. Progress: 30% Interventions: Cognitive Behavioral Therapy  Diagnosis:PTSD, Bi-Polar D/O  Plan: F/U/ appointments scheduled weekly.  Lorrene CHRISTELLA Hasten, [facility]                                                                                                                    Lorrene CHRISTELLA Hasten, [facility]               Lorrene CHRISTELLA Hasten, [facility]               Lorrene CHRISTELLA Hasten, [facility]               Lorrene CHRISTELLA Hasten, [facility]  [facility] Counselor/Therapist  Progress Note  Patient ID: Deshay Blumenfeld, MRN: [id number],    Date: 04/01/2024 This session was held via video teletherapy. The patient consented to the video teletherapy and was located in his home during this session. He is aware it is the responsibility of the patient to secure confidentiality on his end of the session. The  provider was in a private home office for the duration of this session.   The patient arrived on time for her Caregility session  Time Spent: 57 minutes, 2 PM to 2:57 PM  Treatment Type: Individual Therapy Reported Symptoms: anxiety, panic attacks The patient is still having significant back pain.  He cannot sit anywhere for very long and has to be careful where he sits.  There is still some unsteadiness although he did not re port any falls over the past 2 weeks.  His biggest concern now is for his wife.  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  She is meeting with her cardiologist again next week to see what possible treatment is for her.  He still thinks there may be some issue with her diaphragm but she is meeting with the pulmonologist also.  He is doing what he can to he lp her.  His daughter's birthday is March 11 so he knows that is a difficult time for both he and his wife next week.  Typically they do something to remember her but because of the wife's current health issues they cannot go to [place] as they had planned so they are talking about what to do to remember and celebrate her birthday.  We will continue to work on coping skills for helping with his anxiety and the progression of h is Lewy body dementia diagnosis as well as processing his grief. Mental Status Exam: Appearance:  Well Groomed  Behavior: Appropriate Motor: Pt. Reports some instability due to Lewey Body dementia Speech/Language:  Normal Rate Affect: Appropriate Mood: normal Thought  process: normal Thought content:  WNL Sensory/Perceptual disturbances:  WNL Orientation: oriented to person, place, and situation Attention: Good Conce ntration: Good Memory: Impaired short term Fund of knowledge:  Good Insight:  Good Judgment:  Good Impulse Control: Good  Risk Assessment: Danger to Self: No Self-injurious Behavior: No Danger to Others: No Duty to Warn:no Physical Aggression / Violence:No  Access to Firearms a concern: No  Gang Involvement:No  Treatment plan: Will employ cognitive behavioral therapy as well as grief and loss therapy for relief of anx iety and grief.  Goals of grief therapy or to have a healthier response to the loss of his daughter and less sadness as evidenced by patient report in therapy notes as well as to help him feel less overwhelmed by her loss.  Goals are to see a 50% reduction in grief symptoms over the next 6 months.  I will encourage the use of the patient telling his grief story about his daughter including encouraging him to bring pictures and memorabil ia to help process his feelings including any feelings of guilt associated with her loss.  We will use cognitive behavioral therapy to help identify and change anxiety producing thoughts and behavior patterns so as to improve his ability to better manage anxiety and stress, and manage thoughts and worrisome thinking contributing to anxiety.  We will also refresh and encourage dialectical behavior therapy distress tolerance and mindfulne ss  skills with the intention of reducing anxiety by 50% over the next 6 months. Progress: 30% Interventions: Cognitive Behavioral Therapy  Diagnosis:PTSD, Bi-Polar D/O  Plan: F/U/ appointments scheduled weekly.  Lorrene CHRISTELLA Hasten, [facility]                                                                                                                     Lorrene CHRISTELLA Hasten, [facility]  [place] [facility] Counselor/Therapist Progress Note   Patient ID: Ramaj Frangos, MRN: [id number],    Date: 04/01/2024 This session was held via video teletherapy. The patient consented to the video teletherapy and was located in his home during this session. He is aware it is the responsibility of the patient to secure confidentiality on his end of the session. The provider was in a private home office for the duration of this session.   The patient arrived on time for her Care gility session  Time Spent: 58 minutes, 2 PM to 2:58 PM  Treatment Type: Individual Therapy Reported Symptoms: anxiety, panic attacks A fairly difficult week because it was his  daughter's birthday.  He said this year was more difficult than last year but he could not pinpoint why.  They did remember her about going out to a nice restaurant which is something her daughter always wanted to do.  He said they were able to laugh and jok e about funny things that his daughter had said or done which is not something they were able to do this time last year so he saw that as healthy grieving.  He knows that the time change threw things off a little bit for everybody and his wife still is not feeling well and his back has been bothering him.  He was not able to go to his first round of physical therapy this week because his back was bothering him more.  He has been thinkin g a lot more about his daughter which he feels comfortable with.  He is thankful that it is getting warmer and staying light longer so he can get back to some of the coping skills such as fishing that he has done in the past.   We will continue to work on coping skills for helping with his anxiety and the progression of his Lewy body dementia diagnosis as well as processing his grief. 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 Speech/Language:  Normal Rate Affect: Appropriate Mood: normal Thought process: normal Thought content:  WNL Sensory/Perceptual disturbances:  WNL Orientation: oriented to person, place, and situation Attention: Good Concentration: Good Memory: Impaired short term Fund of knowledge:  Good Insight:  Good Judgment:  Good Impulse  Control: Good  Risk Assessment: Danger to Self: No Self-injurious Behavior: No Danger to Others: No Duty to Warn:no Physical Aggression / Violence:No  Access to Firearms a concern: No  Gang Involvement:No  Treatment plan: Will employ cognitive behavioral therapy as well as grief and loss therapy for relief of anxiety and grief.  Goals of grief therapy  or to have a healthier response to the loss of his daughter and less sadne ss as evidenced by patient report in therapy notes as well as to help him feel less overwhelmed by her loss.  Goals are to see a 50% reduction in grief symptoms over the next 6 months.  I will encourage the use of the patient telling his grief story about his daughter including encouraging him to bring pictures and memorabilia to help process his feelings including any feelings of guilt associated with her loss.  We will use cognitive b ehavioral therapy to help identify and change anxiety producing thoughts and behavior patterns so as to improve his ability to better manage anxiety and stress, and manage thoughts and worrisome thinking contributing to anxiety.  We will also refresh and encourage dialectical behavior therapy  distress tolerance and mindfulness skills with the intention of reducing anxiety by 50% over the next 6 months. Progress: 30% Interventions: Cog nitive Behavioral Therapy  Diagnosis:PTSD, Bi-Polar D/O  Plan: F/U/ appointments scheduled weekly.  Lorrene CHRISTELLA Hasten, [place]                                                                                                                   Lorrene CHRISTELLA Hasten, [facility]               Lorrene CHRISTELLA Hasten, [facility]                Lorrene CHRISTELLA Hasten, [facility]  [place] [facility] Counselor/Therapist Progress Note  Patient ID: Vasilis Luhman, MRN: [id number],    Date: 04/01/2024 This session was held via video teletherapy. The patient consented to the video teletherapy and was located in his home during this session. He is aware it is the responsibility of the patient to secure confidentiality on his end of the session. The provider was in a private home office for the duration of this session.   The patient arrived on time for her Caregility session  Time  Spent: 57 minutes, 2 PM to 2:57 PM  Treatment Type: Individual Therapy Reported Symptoms: anxiety, panic attacks The patient is still having significant back pain.  He cannot sit anywhere for very long and has to be careful where he sits.  There is still some unsteadiness although he did not report any falls over the past 2 weeks.  His biggest concern now is for his wife.  She has been having some difficulty breathing and they did a  test so they found a small hole on the back side of her heart.  She is meeting with her cardiologist again next week to see what possible treatment is for her.  He still thinks there  may be some issue with her diaphragm but she is meeting with the pulmonologist also.  He is doing what he can to help her.  His daughter's birthday is March 11 so he knows that is a difficult time for both he and his wife next week.  Typically they do so mething to remember her but because of the wife's current health issues they cannot go to [place] as they had planned so they are talking about what to do to remember and celebrate her birthday.  We will continue to work on coping skills for helping with his anxiety and the progression of his Lewy body dementia diagnosis as well as processing his grief. Mental Status Exam: Appearance:  Well Groomed  Behavior: Appropriate  Motor: Pt. Reports some instability due to Lewey Body dementia Speech/Language:  Normal Rate Affect: Appropriate Mood: normal Thought process: normal Thought content:  WNL Sensory/Perceptual disturbances:  WNL Orientation: oriented to person, place, and situation Attention: Good Concentration: Good Memory: Impaired short term Fund of knowledge:  Good Insight:  Good Judgment:  Good Impulse Control: Good  Risk Asses sment: Danger to Self: No Self-injurious Behavior: No Danger to Others: No Duty to Warn:no Physical Aggression / Violence:No  Access to Firearms a concern: No  Gang Involvement:No  Treatment plan: Will employ cognitive behavioral therapy as well as grief and loss therapy for relief of anxiety and grief.  Goals of grief therapy or to have a healthier response to the loss of his daughter and less sadness as evidenced by patient  report in therapy notes as well as to help him feel less overwhelmed by her loss.  Goals are to see a 50% reduction in grief symptoms over the next 6 months.  I will encourage the use of the patient telling his grief story about his daughter including encouraging him to bring pictures and memorabilia to help process his feelings including any feelings of guilt  associated with her loss.  We will use cognitive behavioral therapy to help i dentify and change anxiety producing thoughts and behavior patterns so as to improve his ability to better manage anxiety and stress, and manage thoughts and worrisome thinking contributing to anxiety.  We will also refresh and encourage dialectical behavior therapy distress tolerance and mindfulness  skills with the intention of reducing anxiety by 50% over the next 6 months. Progress: 30% Interventions: Cognitive Behavioral Therapy   Diagnosis:PTSD, Bi-Polar D/O  Plan: F/U/ appointments scheduled weekly.  Lorrene CHRISTELLA Hasten, [facility]                                                                                                                   Lorrene CHRISTELLA Hasten, [facility]  [place] [facility] Counselor/Therapist Progress Note  Patient ID: Clary Meeker, MRN: [id number],    Date: 04/01/2024 This session was held via video teletherapy. The patient consented to  the video teletherapy and was located in his home during this session. He is aware it is the responsibility of the patient to secure confidentiality on his end of the session. The provider was in a private home office for the duration of this session.   The patient arrived on time for her Caregility session  Time Spent: 57 minutes, 2 PM to 2:57 PM  Treatment Type: Individual Therapy Reported Symptoms: anxiety, panic attacks Th at was a situation earlier in the week when the patient's neighbor was banging on his back door and window.  There had been situations where the same neighbor had said things and use profanity directed at his wife feeling that the patient and his wife and let their dog use the bathroom in his yard.  The patient said they are very careful because her dog is old and did not think that happen but did not appreciate how they were handled th ings.  He said he got angry very quickly and did not handle it the way that he wanted to.  The situation has been resolved but the patient has had very clear boundaries with his neighbor. His wife continues to progress well from her surgery.  Physically the patient is doing fairly well.  He had to see one of his brothers this week.  He is spending time with his son and son's family.  He has gotten some retirement/insurance things works  out for the most part and is glad to have that settled. We will continue to work on coping skills for helping with his anxiety and the progression of his Lewy body dementia diagnosis  as well as processing his grief. Mental Status Exam: Appearance:  Well Groomed  Behavior: Appropriate Motor: Pt. 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 Speech/Language:  Normal Rate Affect: Appropriate Mood: normal Thought process : normal Thought content:  WNL Sensory/Perceptual disturbances:  WNL Orientation: oriented to person, place, and situation Attention: Good Concentration: Good Memory: Impaired short term Fund of knowledge:  Good Insight:  Good Judgment:  Good Impulse Control: Good  Risk Assessment: Danger to Self: No Self-injurious Behavior: No Danger to Others: No Duty to Warn:no Physical Aggression / Violence:No  Access to Fi rearms a concern: No  Gang Involvement:No  Treatment plan: Will employ cognitive behavioral therapy as well as grief and loss therapy for relief of anxiety and grief.  Goals of grief therapy or to have a healthier response to the loss of his daughter and less sadness as evidenced by patient report in therapy notes as well as to help him feel less overwhelmed by her loss.  Goals are to see a 50% reduction in grief symptoms over the nex t 6 months.  I will encourage the use of the patient telling his grief story about his daughter including encouraging him to bring pictures and memorabilia to help process his feelings including any feelings of guilt associated with her loss.  We will use cognitive behavioral therapy to help identify and change anxiety producing thoughts and behavior patterns so as to improve his ability to better manage anxiety and stress, and manage t houghts and worrisome thinking contributing to anxiety.  We will also refresh and encourage dialectical behavior therapy distress tolerance and mindfulness skills with the intention of reducing anxiety by 50% over the  next 6 months. Progress: 30% Interventions: Cognitive Behavioral Therapy  Diagnosis:PTSD, Bi-Polar D/O  Plan: F/U/ appointments scheduled  weekly.  Lorrene CHRISTELLA Hasten, [facility]                                                                                                                    Lorrene CHRISTELLA Hasten, [facility]               Lorrene CHRISTELLA Hasten, [facility]               Lorrene CHRISTELLA Hasten, [facility]               Lorrene CHRISTELLA Hasten, [facility]               Lorrene CHRISTELLA Hasten, [facility]               C raig CHRISTELLA Hasten, [facility]               Lorrene CHRISTELLA Hasten, [facility]               Lorrene CHRISTELLA Hasten, [facility]               Lorrene CHRISTELLA Hasten, [facility]               Lorrene CHRISTELLA Hasten, [facility]   Behavioral Health Counselor/Therapist Progress Note  Patient ID: Raequon Catanzaro, MRN: [id number],    Date: 04/01/2024 This session was held via video teletherapy. The patient consented to the video teletherapy and was located in his home during this session. He is aware it is the responsibility of the patient to secure con fidentiality on his end of the session. The provider was in a private home office for the duration of this session.   The patient arrived on time for her Caregility session  Time Spent: 59 minutes, 2 PM to 2:59 PM  Treatment Type: Individual Therapy Reported Symptoms: anxiety, panic attacks The patient said he jokingly said to his wife that he had not had any pain issues in the cervical area and then a few days ago he started h aving pain in his neck and shoulders.  He now sits related to degenerative disk and has some consistent pain somewhere in his spine with that but jokingly says it just depends on Wednesday which part of his body is decides to show up and hurt.  He is doing the best that he can.  He is still walking which he knows is good for him but does not have right now the strength to do as much in the morning as he has done in the past so he is den ying that to 3 times a day for consistency.  He is still doing some positive things and that he is staying in touch with his brother and other family members.  He is reconnecting with friends from high school and he is which she is enjoying.  One of them has had a similar experience in that she lost her son in the same way the patient lost his daughter.  He said they did not think and Easter in which they set a place for his daughter at  the table and put her picture there.  He initially had reservations about doing that he and his wife had made everyone feel as if she was really there with him.  He still feels that he  is grieving in a healthy way.  He and his wife have always taken a trip around both of their birthdays which are 2 days apart.  That is now close to his daughter died so they have started taking some of her rashes with him and sprinkling them where they  go as a way of remembrance.  They are deciding what that looks like this year in July.  He does contract for safety having no thoughts of hurting himself or anyone else. We will continue to work on coping skills for helping with his anxiety and the progression of his Lewy body dementia diagnosis as well as processing his grief. Mental Status Exam: Appearance:  Well Groomed  Behavior: Appropriate Motor: Pt. Reports some instab ility due to Lewey Body dementia Speech/Language:  Normal Rate Affect: Appropriate Mood: normal Thought process: normal Thought content:  WNL Sensory/Perceptual disturbances:  WNL Orientation: oriented to person, place, and situation Attention: Good Concentration: Good Memory: Impaired short term Fund of knowledge:  Good Insight:  Good Judgment:  Good Impulse Control: Good  Risk Assessment: Danger to Self: No S elf-injurious Behavior: No Danger to Others: No Duty to Warn:no Physical Aggression / Violence:No  Access to Firearms a concern: No  Gang Involvement:No  Treatment plan: Will employ cognitive behavioral therapy as well as grief and loss therapy for relief of anxiety and grief.  Goals of grief therapy or  to have a healthier response to the loss of his daughter and less sadness as evidenced by patient report in therapy notes as well  as to help him feel less overwhelmed by her loss.  Goals are to see a 50% reduction in grief symptoms over the next 6 months.  I will encourage the use of the patient telling his grief story about his daughter including encouraging him to bring pictures and memorabilia to help process his feelings including any feelings of guilt associated with her loss.  We will use  cognitive behavioral therapy to help identify and change anxiety prod ucing thoughts and behavior patterns so as to improve his ability to better manage anxiety and stress, and manage thoughts and worrisome thinking contributing to anxiety.  We will also refresh and encourage dialectical behavior therapy distress tolerance and mindfulness skills with the intention of reducing anxiety by 50% over the next 6 months. Progress: 30% Interventions: Cognitive Behavioral Therapy  Diagnosis:PTSD, Bi-Polar D/O   Plan: F/U/ appointments scheduled weekly.  Lorrene CHRISTELLA Hasten, [facility]                                                                                                                   Lorrene CHRISTELLA Hasten, [facility]  [place] [facility] Counselor/Therapist Progress Note  Patient ID: Colden Samaras, MRN: [id number],    Date: 04/01/2024 This session was held via video teletherapy. The patient consented to the video teletherapy and was  located in his home during this session. He is aware it is the responsibility of the patient to secure confidentiality on his end of the session. The provider was in a private home office for the duration of this session.   The patient arrived on time for her Caregility session  Time Spent: 58 minutes, 2 PM to 2:58 PM  Treatment Type: Individual Therapy Reported Symptoms: anxiety, panic attacks A fairly difficult week because i t was his daughter's birthday.  He said this year was more difficult than last year but he could not pinpoint why.  They did remember her about going out to a nice restaurant which is something her daughter always wanted to do.  He said they were able to laugh and joke about funny things that his daughter had said or done which is not something they were able to do this time last year so he saw that as healthy grieving.  He knows that t he time change threw things off a little bit for everybody and his wife still is not feeling well and his back has been bothering him.  He was not able to go to his first round of physical therapy this week because his back was bothering him more.  He has been thinking a lot more about his daughter which he feels comfortable with.  He is thankful that it is getting warmer and staying light longer so he can get back to some of the coping  skills such as fishing that he has done in the past.   We will continue to work on coping skills for helping with his anxiety and the progression of his Lewy body dementia diagnosis  as well as processing his grief. Mental Status Exam: Appearance:  Well Groomed  Behavior: Appropriate Motor: Pt. 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 Speech/Language:  Normal Rate Affect: Appropriate Mood: normal Thought proces s: normal Thought content:  WNL Sensory/Perceptual disturbances:  WNL Orientation: oriented to person, place, and situation Attention: Good Concentration: Good Memory: Impaired short term Fund of knowledge:  Good Insight:  Good Judgment:  Good Impulse Control: Good  Risk Assessment: Danger to Self: No Self-injurious Behavior: No Danger to Others: No Duty to Warn:no Physical Aggression / Violence:No  Access to F irearms a concern: No  Gang Involvement:No  Treatment plan: Will employ cognitive behavioral therapy as well as grief and loss therapy for relief of anxiety and grief.  Goals of grief therapy or to have a healthier response to the loss of his daughter and less sadness as evidenced by patient report in therapy notes as well as to help him feel less overwhelmed by her loss.  Goals are to see a 50% reduction in grief symptoms over the ne xt 6 months.  I will encourage the use of the patient telling his grief story about his daughter including encouraging him to bring pictures and memorabilia to help process his feelings including any feelings of guilt associated with her loss.  We will use cognitive behavioral therapy to help identify and change anxiety producing thoughts and behavior patterns so as to improve his ability to better manage anxiety and stress, and manage  thoughts and worrisome thinking contributing to anxiety.  We will also refresh and encourage dialectical behavior therapy distress  tolerance and mindfulness skills with the intention of reducing anxiety by 50% over the next 6 months. Progress: 30% Interventions: Cognitive Behavioral Therapy  Diagnosis:PTSD, Bi-Polar D/O  Plan: F/U/ appointments scheduled  weekly.  Lorrene CHRISTELLA Hasten, [facility]                                                                                                                    Lorrene CHRISTELLA Hasten, [facility]               Lorrene CHRISTELLA Hasten, [facility]               Lorrene CHRISTELLA Hasten, [facility]  [place] [facility] Counselor/Therapist Progress Note  Patient ID: Amear Myers, MRN: [id number],    Date: 04/01/2024 This session was held via video teletherapy. The patient consented to the video teletherapy and was located in his home during this se ssion. He is aware it is the responsibility of the patient to secure confidentiality on his end of the  session. The provider was in a private home office for the duration of this session.   The patient arrived on time for her Caregility session  Time Spent: 1:05 PM until 2 PM, 55 minutes Treatment Type: Individual Therapy Reported Symptoms: anxiety, panic attacks The patient said he came in from his walk this morning and saw wh at he thought was some sort of tic from his wife.  It was not the first time it happened and he wonders if it might be tardive dyskinesia because of some new medications that she is on.  She has a call into the doctor right now and hopes to hear something back soon.  They have narrowed down to the fact they think most of her discomfort in breathing is coming from the diaphragm and they are looking for alternatives for treatment.  The  patient continues to do those things which help him cope including walking, fishing when he can, spending time with his son and daughter-in-law and grandson.  He also has a way of continued coping with grief he is writing letters to his daughters as a way of expressing his feelings and says that is very beneficial.  He appears to be grieving in a healthy way but he knows that he to year anniversary of her death is coming up and there is  some grief there but says it is not overwhelming.  We will continue to work on coping skills for helping with his anxiety and the progression of his Lewy body dementia diagnosis as well as processing his grief. Mental Status Exam: Appearance:  Well Groomed  Behavior: Appropriate Motor: Pt. Reports some instability due to Lewey Body dementia Speech/Language:  Normal Rate Affect: Appropriate Mood: normal Thought process: no rmal Thought content:  WNL Sensory/Perceptual disturbances:  WNL Orientation: oriented to person, place, and situation Attention: Good Concentration: Good Memory: Impaired short term Fund of knowledge:  Good Insight:  Good Judgment:  Good Impulse Control: Good  Risk  Assessment: Danger to Self: No Self-injurious Behavior: No Danger to Others: No Duty to Warn:no Physical Aggression / Violence:No  Access to [employer] a concern: No  Gang Involvement:No  Treatment plan: Will employ cognitive behavioral therapy as well as grief and loss therapy for relief of anxiety and grief.  Goals of grief therapy or to have a healthier response to the loss of his daughter and less sadness as evidenced by patient report in therapy notes as well as to help him feel less overwhelmed by her loss.  Goals are to see a 50% reduction in grief symptoms over the next 6  months.  I will encourage the use of the patient telling his grief story about his daughter including encouraging him to bring pictures and memorabilia to help process his feelings including any feelings of guilt associated with her loss.  We will use cognitive behavioral therapy to help identify and change anxiety producing thoughts and behavior patterns so as to improve his ability to better manage anxiety and stress, and manage thoug hts and worrisome thinking contributing to anxiety.  We will also refresh and encourage dialectical behavior therapy distress tolerance  and mindfulness skills with the intention of reducing anxiety by 50% over the next 6 months. Progress: 30% Interventions: Cognitive Behavioral Therapy  Diagnosis:PTSD, Bi-Polar D/O  Plan: F/U/ appointments scheduled weekly.  Lorrene CHRISTELLA Hasten, [facility]                                                                                                                    Lorrene CHRISTELLA Hasten, [facility] [place]  [place] [facility] Counselor/Therapist Progress Note  Patient ID: Jaecion Dempster, MRN: [id number],    Date: 04/01/2024 This session was held via video teletherapy. The patient consented to the video teletherapy and was located in his home during this session. He is aware it is the responsibility of the patient to secure confidentiality  on his end of the session. The provider was in a private home office for the duration of this session.   The patient arrived on time for her Caregility session  Time Spent: 59 minutes, 2 PM to 2:59 PM  Treatment Type: Individual Therapy Reported Symptoms: anxiety, panic attacks The patient continues to present with physical pain and both his back and his knee.  He started with a new physical therapist and told him that there wa s a lot of arthritis in both knees and or certain things he cannot do but they had him do that anyway and now for the past 3 days he has been in significant knee pain.  He has continued  to walk because he knows that is good for him in various ways but says it has been painful.  His wife also was not feeling well but she is going back to the doctor next week to have an ablation and hopes that will bring her some relief.  For the most par t he has been at the last couple weeks reaching back out and hearing from some old friends as well as time with his son and son's family.  He says that his wife's cousin is coming down for a weekend soon and they enjoy time with her and that he will go to his brother's house sometime in the next few weeks.  He recognizes the need for people that he cares about in helping with his depression. We will continue to work on coping skills fo r helping with his anxiety and the progression of his Lewy body dementia diagnosis as well as processing his grief. 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 Speech/Language:  Normal Rate Affect: Appropriate Mood: normal Thought process: normal Thought content:  WNL Sensory/Perceptual disturbances:  WNL Orientation: oriented to p erson, place, and situation Attention: Good Concentration: Good Memory: Impaired short term Fund of knowledge:  Good Insight:  Good Judgment:  Good Impulse Control: Good  Risk Assessment: Danger to Self: No Self-injurious Behavior: No Danger to Others: No Duty to Warn:no Physical Aggression / Violence:No  Access to Firearms a concern: No  Gang Involvement:No  Treatment plan: Will employ cognitive behavioral therapy  as well as grief and loss therapy for relief of anxiety and grief.  Goals of grief therapy or to have a healthier response to the loss of his daughter and less sadness as evidenced by patient report in therapy notes as well as to help him feel less overwhelmed by her loss.  Goals are to see a 50% reduction in grief symptoms over the next 6 months.  I will encourage the  use of the patient telling his grief story about his daughter includ ing encouraging him to bring pictures and memorabilia to help process his feelings including any feelings of guilt associated with her loss.  We will use cognitive behavioral therapy to help identify and change anxiety producing thoughts and behavior patterns so as to improve his ability to better manage anxiety and stress, and manage thoughts and worrisome thinking contributing to anxiety.  We will also  refresh and encourage dialectica l behavior therapy distress tolerance and mindfulness skills with the intention of reducing anxiety by 50% over the next 6 months. Progress: 30% Interventions: Cognitive Behavioral Therapy  Diagnosis:PTSD, Bi-Polar D/O  Plan: F/U/ appointments scheduled weekly.  Lorrene CHRISTELLA Hasten, [facility]                                                                                                                    Lorrene CHRISTELLA Hasten, [facility]               Lorrene CHRISTELLA Hasten, [facility]               Lorrene CHRISTELLA Hasten, [facility]               Lorrene CHRISTELLA Hasten, [facility]  [place] [facility] Counselor/Therapist Progress Note  Patient ID: Xyler Terpening, MRN: [id number],    Date: 04/01/2024 This session was held via video teletherapy. The patient consented to the video teletherapy and was located in his home during this session. He is aware it is the responsibil ity of the patient to secure confidentiality on his end of the session. The provider was in a private home office for the duration of this session.   The patient arrived on time for her Caregility session  Time Spent: 57 minutes, 2 PM to 2:57 PM  Treatment Type: Individual Therapy Reported Symptoms: anxiety, panic attacks The patient is still having significant back pain.  He cannot sit anywhere for very long and has to be care ful where he sits.  There is still some unsteadiness although he did not report any falls over the past 2 weeks.  His biggest concern now is for his wife.  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  She is meeting with her cardiologist again next week to see what possible treatment is for her.  He still thinks there may be some issue with her diaphragm but  she is meeting with the pulmonologist also.  He is doing what he can to help her.  His daughter's birthday is March 11 so he knows that is a difficult time for both he and his wife  next week.  Typically they do something to remember her but because of the wife's current health issues they cannot go to [place] as they had planned so they are talking about what to do to remember and celebrate her birthday.  We will continue to  work on coping skills for helping with his anxiety and the progression of his Lewy body dementia diagnosis as well as processing his grief. 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 Speech/Language:  Normal Rate Affect: Appropriate Mood: normal Thought process: normal Thought content:  WNL Sensory/Perceptual disturbances:  WNL Or ientation: oriented to person, place, and situation Attention: Good Concentration: Good Memory: Impaired short term Fund of knowledge:  Good Insight:  Good Judgment:  Good Impulse Control: Good  Risk Assessment: Danger to Self: No Self-injurious Behavior: No Danger to Others: No Duty to Warn:no Physical Aggression / Violence:No  Access to Firearms a concern: No  Gang Involvement:No  Treatment plan: Will employ cogni tive behavioral therapy as well as grief and loss therapy for relief of anxiety and grief.  Goals of grief therapy or to have a healthier response to the loss of his daughter and less sadness as evidenced by patient report in therapy notes as well as to help him feel less overwhelmed by her loss.  Goals are to see a 50% reduction in grief symptoms over the next 6 months.  I will encourage the use of the patient telling his grief story a bout his daughter including encouraging him to bring pictures and memorabilia to help process his feelings including any feelings of guilt associated with her loss.  We will use cognitive behavioral therapy to help identify and change anxiety producing thoughts and behavior patterns so as to improve his ability to better manage anxiety and stress, and manage thoughts  and worrisome thinking contributing to anxiety.  We will also refresh  and encourage dialectical behavior therapy distress tolerance and mindfulness  skills with the intention of reducing anxiety by 50% over the next 6 months. Progress: 30% Interventions: Cognitive Behavioral Therapy  Diagnosis:PTSD, Bi-Polar D/O  Plan: F/U/ appointments scheduled weekly.  Lorrene CHRISTELLA Hasten, [facility]                                                                                                                    Lorrene CHRISTELLA Hasten, [facility]  [place] [facility] Counselor/Therapist Progress Note  Patient ID: Jesus Nevills, MRN:  [id number],    Date: 04/01/2024 This session was held via video teletherapy. The patient consented to the video teletherapy and was located in his home during this session. He is aware it is the responsibility of the patient to secure confidentiality on his end of the session. The provider was in a private home office for t he duration of this session.   The patient arrived on time for her Caregility session  Time Spent: 58 minutes, 2 PM to 2:58 PM  Treatment Type: Individual Therapy Reported Symptoms: anxiety, panic attacks A fairly difficult week because it was his daughter's birthday.  He said this year was more difficult than last year but he could not pinpoint why.  They did remember her about going out to a nice restaurant which is something  her daughter always wanted to do.  He said they were able to laugh and joke about funny things that his daughter had said or done which is not something they were able to do this time last year so he saw that as healthy grieving.  He knows that the time change threw things off a little bit for everybody and his wife still is not feeling well and his back has been bothering him.  He was not able to go to his first round of physical ther apy this week because his back was bothering him more.  He has been thinking a lot more about his daughter which he feels comfortable with.  He is thankful that it is getting warmer and staying light longer so he can get back to some of the coping skills such as fishing that he has done in the past.   We will continue to work on coping skills for helping with his anxiety and the progression of his Lewy body dementia diagnosis as well  as processing his grief. 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 Speech/Language:  Normal Rate Affect: Appropriate Mood: normal Thought process: normal Thought content:   WNL Sensory/Perceptual disturbances:  WNL Orientation: oriented to person, place, and situation Attention: Good Concentration: Good Memory: Impaired short  term Fund of knowledge:  Good Insight:  Good Judgment:  Good Impulse Control: Good  Risk Assessment: Danger to Self: No Self-injurious Behavior: No Danger to Others: No Duty to Warn:no Physical Aggression / Violence:No  Access to Firearms a concern: No  Gang Involvement:No  Treatment plan: Will employ cognitive behavioral therapy as well as grief and loss therapy for relief of anxiety and grief.  Goals of grief therapy  or to have a healthier response to the loss of his daughter and less sadness as evidenced by patient report in therapy notes as well as to help him feel less overwhelmed by her loss.  Goals are to see a 50% reduction in grief symptoms over the next 6 months.  I will encourage the use of the patient telling his grief story about his daughter including encouraging him to bring pictures and memorabilia to help process his feelings includi ng any feelings of guilt associated with her loss.  We will use cognitive behavioral therapy to help identify and change anxiety producing thoughts and behavior patterns so as to improve his ability to better manage anxiety and stress, and manage thoughts and worrisome thinking contributing to anxiety.  We will also refresh and encourage dialectical behavior therapy distress  tolerance and mindfulness skills with the intention of reducin g anxiety by 50% over the next 6 months. Progress: 30% Interventions: Cognitive Behavioral Therapy  Diagnosis:PTSD, Bi-Polar D/O  Plan: F/U/ appointments scheduled weekly.  Lorrene CHRISTELLA Hasten, [facility]                                                                                                                   Lorrene CHRISTELLA Hasten,  [facility]               Lorrene CHRISTELLA Hasten, [facility]               Lorrene CHRISTELLA Hasten, [facility]  [place]  [facility] Counselor/Therapist Progress Note  Patient ID: Williams Dietrick, MRN: [id number],    Date: 04/01/2024 This session was held via video teletherapy. The patient consented to the video teletherapy and was located in his home during this session. He is aware it is the responsibility of the patient to secure confidentiality on his end of the session. The provider was in a private home office for the duration of this se ssion.   The patient arrived on time for her Caregility session  Time Spent: 57 minutes, 2 PM to 2:57 PM  Treatment Type: Individual Therapy Reported Symptoms: anxiety, panic  attacks The patient is still having significant back pain.  He cannot sit anywhere for very long and has to be careful where he sits.  There is still some unsteadiness although he did not report any falls over the past 2 weeks.  His biggest concern now is  for his wife.  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  She is meeting with her cardiologist again next week to see what possible treatment is for her.  He still thinks there may be some issue with her diaphragm but she is meeting with the pulmonologist also.  He is doing what he can to help her.  His daughter's birthday is March 11 so he knows that is  a difficult time for both he and his wife next week.  Typically they do something to remember her but because of the wife's current health issues they cannot go to [place] as they had planned so they are talking about what to do to remember and celebrate her birthday.  We will continue to work on coping skills for helping with his anxiety and the progression of his Lewy body dementia diagnosis as well as processing his grief.  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 Speech/Language:  Normal Rate Affect: Appropriate Mood: normal Thought process: normal Thought content:  WNL Sensory/Perceptual disturbances:  WNL Orientation: oriented to person, place, and situation Attention: Good Concentration: Good Memory: Impaired short term Fund of knowledge:  G ood Insight:  Good Judgment:  Good Impulse Control: Good  Risk Assessment: Danger to Self: No Self-injurious Behavior: No Danger to Others: No Duty to Warn:no Physical Aggression / Violence:No  Access to Firearms a concern: No  Gang Involvement:No  Treatment plan: Will employ cognitive behavioral therapy as well as grief and loss therapy for relief  of anxiety and grief.  Goals of grief therapy or to have a healthier res ponse to the loss of his daughter and less sadness as evidenced by patient report in therapy notes as well as to help him feel less overwhelmed by her loss.  Goals are to see a 50% reduction in grief symptoms over the next 6 months.  I will encourage the use of the patient telling his grief story about his daughter including encouraging him to bring pictures and memorabilia to help process his feelings including any feelings of guilt as sociated with her loss.  We will use cognitive behavioral therapy to help identify and change anxiety producing thoughts and behavior patterns so as to improve his ability to better manage anxiety and stress, and manage thoughts and worrisome thinking contributing to anxiety.  We will also refresh and encourage dialectical behavior therapy distress tolerance and mindfulness  skills with the intention of reducing anxiety by 50% over the n ext 6 months. Progress: 30% Interventions: Cognitive Behavioral Therapy  Diagnosis:PTSD, Bi-Polar D/O  Plan: F/U/ appointments scheduled weekly.  Lorrene CHRISTELLA Hasten, [facility]                                                                                                                   Lorrene CHRISTELLA Hasten, [facility]  [place] [facility] Counselor/Therapist Progress Note  Patient ID: Siris Hoos, MRN: [id number],    Date: 5/30/ 2025 This session was held via video teletherapy. The patient consented to the video teletherapy and was located in his home during this session. He is aware it is the responsibility of the patient to secure confidentiality on his end of the session. The provider was in a private home office for the duration of this session.   The patient arrived on time for her Caregility session  Time Spent: 57 minutes, 2 PM to 2:57 PM  Treatm ent Type: Individual Therapy Reported Symptoms: anxiety, panic attacks  We will continue to work on coping skills for helping with his anxiety and the progression of his Lewy body dementia diagnosis as well as processing his grief. 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 Speech/Language:  Normal Rate Affect: Appropriate Mood: normal  Thought process: normal Thought content:  WNL Sensory/Perceptual disturbances:  WNL Orientation: oriented to person, place, and  situation Attention: Good Concentration: Good Memory: Impaired short term Fund of knowledge:  Good Insight:  Good Judgment:  Good Impulse Control: Good  Risk Assessment: Danger to Self: No Self-injurious Behavior: No Danger to Others: No Duty to Warn:no Physical Aggression / Violence: No  Access to Firearms a concern: No  Gang Involvement:No  Treatment plan: Will employ cognitive behavioral therapy as well as grief and loss therapy for relief of anxiety and grief.  Goals of grief therapy or to have a healthier response to the loss of his daughter and less sadness as evidenced by patient report in therapy notes as well as to help him feel less overwhelmed by her loss.  Goals are to see a 50% reduction in grief symp toms over the next 6 months.  I will encourage the use of the patient telling his grief story about his daughter including encouraging him to bring pictures and memorabilia to help process his feelings including any feelings of guilt associated with her loss.  We will use cognitive behavioral therapy to help identify and change anxiety producing thoughts and behavior patterns so as to improve his ability to better manage anxiety and str ess, and manage thoughts and worrisome thinking contributing to anxiety.  We will also refresh and encourage dialectical behavior therapy distress tolerance and mindfulness skills with the intention of reducing anxiety by 50% over the next 6 months. Progress: 30% Interventions: Cognitive Behavioral Therapy  Diagnosis:PTSD, Bi-Polar D/O  Plan: F/U/ appointments scheduled weekly.  Lorrene CHRISTELLA Hasten, [facility]                                                                                                                    Lorrene CHRISTELLA Hasten, [facility]               Lorrene CHRISTELLA Hasten, [facility]               Lorrene CHRISTELLA Hasten,  [facility]  Lorrene CHRISTELLA Hasten, [facility]               Lorrene CHRISTELLA Hasten, [facility]                Lorrene CHRISTELLA Hasten, [facility]               Lorrene CHRISTELLA Hasten, [facility]  [place] [facility] Counselo r/Therapist Progress Note  Patient ID: Colman Birdwell, MRN: [id number],    Date: 04/01/2024 This session was held via video teletherapy. The patient consented to the video teletherapy and was located in his home during this session. He is aware it is the responsibility of the patient to secure confidentiality on his end of the session. The provider was in a private home office for the duration of this session.   The patient ar rived on time for her Caregility session  Time Spent: 39  minutes, 2 PM to 2:39 PM  Treatment Type: Individual Therapy Reported Symptoms: anxiety, panic attacks The patient was not feeling well today.  He started feeling really earlier in the week having a fever bad headaches body aches.  He felt a little better yesterday but feels a little bit of a resurgence in headaches and body aches and is running a low-grade fever.  His wife i s feeling some of the same symptoms.  For the most part he says he is doing well.  They did increase his memory medication because they were starting to see some increasing decline in short-term memory and he said until recently his long-term memory had been good but he was starting to see a difference in how he used to markers to remember what happened at certain times historically.  He feels the medication has helped and has brightene d his mood a little also.  He is still trying to walk every day and fish when he can and has enjoyed watching the [employer] as a good distraction.  He reports that he feels he is managing mood fairly well.  He has an appointment in 2 weeks.  He does contract for safety having no thoughts of hurting himself or anyone else. We will continue to work on coping skills for helping with his anxiety and the progression of his L ewy body dementia diagnosis as well as processing his grief. 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 Speech/Language:  Normal Rate Affect: Appropriate Mood: normal Thought process: normal Thought content:  WNL Sensory/Perceptual disturbances:  WNL Orientation: oriented to person, place, and situation Attention: Good Concentra tion: Good Memory: Impaired short term Fund of knowledge:  Good Insight:  Good Judgment:  Good Impulse Control: Good  Risk Assessment: Danger to Self: No Self-injurious Behavior: No Danger to Others: No Duty to Warn:no Physical  Aggression / Violence:No  Access to Firearms a concern: No  Gang Involvement:No  Treatment plan: Will employ cognitive behavioral therapy as well as grief and loss therapy for relief of anxiety  and grief.  Goals of grief therapy or to have a healthier response to the loss of his daughter and less sadness as evidenced by patient report in therapy notes as well as to help him feel less overwhelmed by her loss.  Goals are to see a 50% reduction in grief symptoms over the next 6 months.  I will encourage the use of the patient telling his grief story about his daughter including encouraging him to bring pictures and memorabilia t o help process his feelings including any feelings of guilt associated with her loss.  We will use cognitive behavioral therapy to help identify and change anxiety producing thoughts and behavior patterns so as to improve his ability to better manage anxiety and stress, and manage thoughts and worrisome thinking contributing to anxiety.  We will also refresh  and encourage dialectical behavior therapy distress tolerance and mindfulness s kills with the intention of reducing anxiety by 50% over the next 6 months. Progress: 30% Interventions: Cognitive Behavioral Therapy  Diagnosis:PTSD, Bi-Polar D/O  Plan: F/U/ appointments scheduled weekly.  Lorrene CHRISTELLA Hasten, [facility]                                                                                                                    Lorrene CHRISTELLA Hasten,  [facility]  [facility] Counselor/Therapist Progress Note  P atient ID: Deandrae Wajda, MRN: [id number],    Date: 04/01/2024 This session was held via video teletherapy. The patient consented to the video teletherapy and was located in his home during this session. He is aware it is the responsibility of the patient to secure confidentiality on his end of the session. The provider was in a private home office for the duration of this session.   The patient arrived on time for her Caregili ty session  Time Spent: 58 minutes, 2 PM to 2:58 PM  Treatment Type: Individual Therapy Reported Symptoms: anxiety, panic attacks A fairly difficult week because it was his daughter's birthday.  He said this year was more  difficult than last year but he could not pinpoint why.  They did remember her about going out to a nice restaurant which is something her daughter always wanted to do.  He said they were able to laugh and joke ab out funny things that his daughter had said or done which is not something they were able to do this time last year so he saw that as healthy grieving.  He knows that the time change threw things off a little bit for everybody and his wife still is not feeling well and his back has been bothering him.  He was not able to go to his first round of physical therapy this week because his back was bothering him more.  He has been thinking a  lot more about his daughter which he feels comfortable with.  He is thankful that it is getting warmer and staying light longer so he can get back to some of the coping skills such as fishing that he has done in the past.   We will continue to work on coping skills for helping with his anxiety and the progression of his Lewy body dementia diagnosis as well as processing his grief. 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 Speech/Language:  Normal Rate Affect: Appropriate Mood: normal Thought process: normal Thought content:  WNL Sensory/Perceptual disturbances:  WNL Orientation: oriented to person, place, and situation Attention: Good Concentration: Good Memory: Impaired short term Fund of knowledge:  Good Insight:  Good Judgment:  Good Impulse Cont rol: Good  Risk Assessment: Danger to Self: No Self-injurious Behavior: No Danger to Others: No Duty to Warn:no Physical Aggression / Violence:No  Access to Firearms a concern: No  Gang Involvement:No  Treatment plan: Will employ cognitive behavioral therapy as well as grief and loss therapy for relief of anxiety and grief.  Goals of grief therapy or to have a healthier response to the loss of his  daughter and less sadness a s evidenced by patient report in therapy notes as well as to help him feel less overwhelmed by her loss.  Goals are to see a 50% reduction in grief symptoms over the next 6 months.  I will encourage the use of the patient telling his grief story about his daughter including encouraging him to bring pictures and memorabilia to help process his feelings including any feelings of guilt associated with her loss.  We will use cognitive behav ioral therapy to help identify and change anxiety producing thoughts and behavior patterns so as to improve his ability to better manage anxiety and stress, and manage thoughts and worrisome thinking contributing to anxiety.  We will also refresh and encourage dialectical behavior therapy  distress tolerance and mindfulness skills with the intention of reducing anxiety by 50% over the next 6 months. Progress: 30% Interventions: Cogniti ve Behavioral Therapy  Diagnosis:PTSD, Bi-Polar D/O  Plan: F/U/ appointments scheduled weekly.  Lorrene CHRISTELLA Hasten, [facility]                                                                                                                   Lorrene CHRISTELLA Hasten, [facility]               Lorrene CHRISTELLA Hasten, [facility]                Lorrene CHRISTELLA Hasten, [facility]  [facility] Counselor/Therapist Progress Note  Patient ID: Niam Nepomuceno, MRN:  [id number],    Date: 04/01/2024 This session was held via video teletherapy. The patient consented to the video teletherapy and was located in his home during this session. He is aware it is the responsibility of the patient to secure confidentiality on his end of the session. The provider was in a private home office for the duration of this session.   The patient arrived on time for her Caregility session  Time Spent: 1:05 PM  until 2 PM, 55 minutes Treatment Type: Individual Therapy Reported Symptoms: anxiety, panic attacks The patient said he came in from his walk this morning and saw what he thought was some sort of tic from his wife.  It was not the first time it happened and he wonders if it might be tardive dyskinesia because of some new medications that she is on.  She has a call into the doctor right now and hopes to hear something back soon.  They  have narrowed down to the fact they think most of her discomfort in breathing is coming from the diaphragm and they are looking for alternatives for treatment.  The patient continues  to do those things which help him cope including walking, fishing when he can, spending time with his son and daughter-in-law and grandson.  He also has a way of continued coping with grief he is writing letters to his daughters as a way of expressing hi s feelings and says that is very beneficial.  He appears to be grieving in a healthy way but he knows that he to year anniversary of her death is coming up and there is some grief there but says it is not overwhelming.  We will continue to work on coping skills for helping with his anxiety and the progression of his Lewy body dementia diagnosis as well as processing his grief. Mental Status Exam: Appearance:  Well Groomed  Beha vior: Appropriate Motor: Pt. Reports some instability due to Lewey Body dementia Speech/Language:  Normal Rate Affect: Appropriate Mood: normal Thought process: normal Thought content:  WNL Sensory/Perceptual disturbances:  WNL Orientation: oriented to person, place, and situation Attention: Good Concentration: Good Memory: Impaired short term Fund of knowledge:  Good Insight:  Good Judgment:  Good Impulse Control:  Good  Risk Assessment: Danger to Self: No Self-injurious Behavior: No Danger to Others: No Duty to Warn:no Physical Aggression / Violence:No  Access to Firearms a concern: No  Gang Involvement:No  Treatment plan: Will employ cognitive behavioral therapy as well as grief and loss therapy for relief of anxiety and grief.  Goals of grief therapy or to have a healthier response to the loss of his daughter and less sadness as evi denced by patient report in therapy notes as well as to help him feel less overwhelmed by her loss.  Goals are to see a 50% reduction in grief symptoms over the next 6 months.  I will encourage the use of the patient telling his grief story about his daughter including encouraging him to bring pictures and memorabilia to help process his feelings including any feelings  of guilt associated with her loss.  We will use cognitive behavioral  therapy to help identify and change anxiety producing thoughts and behavior patterns so as to improve his ability to better manage anxiety and stress, and manage thoughts and worrisome thinking contributing to anxiety.  We will also refresh and encourage dialectical behavior therapy distress tolerance  and mindfulness skills with the intention of reducing anxiety by 50% over the next 6 months. Progress: 30% Interventions: Cognitive Be havioral Therapy  Diagnosis:PTSD, Bi-Polar D/O  Plan: F/U/ appointments scheduled weekly.  Lorrene CHRISTELLA Hasten, [facility]                                                                                                                   Lorrene CHRISTELLA Hasten, [facility]  [place] [facility] Counselor/Therapist Progress Note  Patient ID: Hasnain Manheim, MRN: [id number],    Date: 04/01/2024 This session was held via video teletherapy. The pa tient consented to the video teletherapy and was located in his home during this session. He is aware it is the responsibility of the patient to secure confidentiality on his end of the session. The provider was in a private home office for the duration of this session.   The patient arrived on time for her Caregility session  Time Spent: 59 minutes, 2 PM to 2:59 PM  Treatment Type: Individual Therapy Reported Symptoms: anxiety,  panic attacks The patient continues to present with physical pain and both his back and his knee.  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  He has continued to walk because he knows that is good for him in various ways but says it has been pai nful.  His wife also was not feeling well but she is going back to the doctor next week to have an ablation and hopes that will bring her some relief.  For the most part he has been at the last couple weeks reaching back out and hearing from some old friends as well as time with his son and son's family.  He says that his wife's cousin is coming down for a weekend soon and they enjoy time with her and that he will go to his brother's ho use sometime in the next few weeks.  He recognizes the need for people that he cares about in helping with his depression. We will continue to work on coping skills for helping with  his anxiety and the progression of his Lewy body dementia diagnosis as well as processing his grief. Mental Status Exam: Appearance:  Well Groomed  Behavior: Appropriate Motor: Pt. Reports some instability due to Lewey Body dementia Speech/Language :  Normal Rate Affect: Appropriate Mood: normal Thought process: normal Thought content:  WNL Sensory/Perceptual disturbances:  WNL Orientation: oriented to person, place, and situation Attention: Good Concentration: Good Memory: Impaired short term Fund of knowledge:  Good Insight:  Good Judgment:  Good Impulse Control: Good  Risk Assessment: Danger to Self: No Self-injurious Behavior: No Danger to Others: No  Duty to Warn:no Physical Aggression / Violence:No  Access to Firearms a concern: No  Gang Involvement:No  Treatment plan: Will employ cognitive behavioral therapy as well as grief and loss therapy for relief of anxiety and grief.  Goals of grief therapy or to have a healthier response to the loss of his daughter and less sadness as evidenced by patient report in therapy notes as well as to help him feel less overwhelmed by her loss .  Goals are to see a 50% reduction in grief symptoms over the next 6 months.  I will encourage the use of the patient telling his grief story about his daughter including encouraging him to bring pictures and memorabilia to help process his feelings including any feelings of guilt associated with her loss.  We will use cognitive behavioral therapy to help identify and change anxiety producing thoughts and behavior patterns so as to imp rove his ability to better manage anxiety and stress, and manage thoughts and worrisome thinking contributing to anxiety.  We will also  refresh and encourage dialectical behavior therapy distress tolerance and mindfulness skills with the intention of reducing anxiety by 50% over the next 6 months. Progress: 30% Interventions: Cognitive Behavioral  Therapy  Diagnosis:PTSD, Bi-Polar D/O  Plan: F/U/ appointments scheduled weekly.  C raig CHRISTELLA Hasten, [facility]                                                                                                                   Lorrene CHRISTELLA Hasten, [facility]               Lorrene CHRISTELLA Hasten, [facility]               Lorrene CHRISTELLA Hasten, [facility]               Lorrene CHRISTELLA Hasten, [facility]  [place] [facility] Counselor/Therapist Progress Note  Patient ID: Eligh Rybacki, MRN: [id number],    Date: 04/01/2024 This s ession was held via video teletherapy. The patient consented to the video teletherapy and was  located in his home during this session. He is aware it is the responsibility of the patient to secure confidentiality on his end of the session. The provider was in a private home office for the duration of this session.   The patient arrived on time for her Caregility session  Time Spent: 57 minutes, 2 PM to 2:57 PM  Treatment Type: In dividual Therapy Reported Symptoms: anxiety, panic attacks The patient is still having significant back pain.  He cannot sit anywhere for very long and has to be careful where he sits.  There is still some unsteadiness although he did not report any falls over the past 2 weeks.  His biggest concern now is for his wife.  She has been having some difficulty breathing and they did a test so they found a small hole on the back side of her  heart.  She is meeting with her cardiologist again next week to see what possible treatment is for her.  He still thinks there may be some issue with her diaphragm but she is meeting with the pulmonologist also.  He is doing what he can to help her.  His daughter's birthday is March 11 so he knows that is a difficult time for both he and his wife next week.  Typically they do something to remember her but because of the wife's curren t health issues they cannot go to [place] as they had planned so they are talking about what to do to remember and celebrate her birthday.  We will continue to work on coping skills for helping with his anxiety and the progression of his Lewy body dementia diagnosis as well as processing his grief. Mental Status Exam: Appearance:  Well Groomed  Behavior: Appropriate Motor: Pt. Reports some instability due to Lewey Body d ementia Speech/Language:  Normal Rate Affect: Appropriate Mood: normal Thought process: normal Thought content:  WNL Sensory/Perceptual disturbances:  WNL Orientation: oriented to person, place, and situation Attention: Good Concentration: Good Memory: Impaired  short term Fund of knowledge:  Good Insight:  Good Judgment:  Good Impulse Control: Good  Risk Assessment: Danger to Self: No Self-injurious Behavior: N o Danger to Others: No Duty to Warn:no Physical Aggression / Violence:No  Access to Firearms a concern: No  Gang Involvement:No  Treatment plan: Will employ cognitive behavioral therapy as well as grief and loss therapy for relief of anxiety and grief.  Goals of grief therapy or to have a healthier response to the loss of his daughter and less sadness as evidenced by patient report in therapy notes as well as to help him feel less  overwhelmed by her loss.  Goals are to see a 50% reduction in grief symptoms over the next 6 months.  I will encourage the use of the patient telling his grief story about his daughter including encouraging him to bring pictures and memorabilia to help process his feelings including any feelings of guilt associated with her loss.  We will use cognitive behavioral therapy to help identify and change anxiety producing thoughts and behavi or patterns so as to improve his ability to better manage anxiety and stress, and manage thoughts and worrisome thinking contributing to anxiety.  We will also refresh and encourage dialectical behavior therapy distress tolerance and mindfulness  skills with the intention of reducing anxiety by 50% over the next 6 months. Progress: 30% Interventions: Cognitive Behavioral Therapy  Diagnosis:PTSD, Bi-Polar D/O  Plan: F/U/ appointment s scheduled weekly.  Lorrene CHRISTELLA Hasten, [facility]                                                                                                                   Lorrene CHRISTELLA Hasten, [facility]  [place] [facility] Counselor/Therapist Progress Note  Patient ID: Treyvion Durkee, MRN: [id number],    Date: 04/01/2024 This session was held via video teletherapy. The patient consented to the video teletherapy and was located in his home durin g this session. He is aware it is the responsibility of the patient to secure confidentiality on his end of the session. The provider was in a private home office for the duration of this session.   The patient arrived on time for her Caregility session  Time Spent: 58 minutes, 2 PM to 2:58 PM  Treatment Type: Individual Therapy Reported Symptoms: anxiety, panic attacks A fairly difficult week because it was his daughter's birt hday.  He said this year was more difficult than last year but he could not pinpoint why.  They did remember her about going out to a nice restaurant which is something her daughter  always wanted to do.  He said they were able to laugh and joke about funny things that his daughter had said or done which is not something they were able to do this time last year so he saw that as healthy grieving.  He knows that the time change threw thin gs off a little bit for everybody and his wife still is not feeling well and his back has been bothering him.  He was not able to go to his first round of physical therapy this week because his back was bothering him more.  He has been thinking a lot more about his daughter which he feels comfortable with.  He is thankful that it is getting warmer and staying light longer so he can get back to some of the coping skills such as fishing t hat he has done in the past.   We will continue to work on coping skills for helping with his anxiety and the progression of his Lewy body dementia diagnosis as well as processing his grief. Mental Status Exam: Appearance:  Well Groomed  Behavior: Appropriate Motor: Pt. Reports some instability due to Lewey Body dementia Speech/Language:  Normal Rate Affect: Appropriate Mood: normal Thought process: normal Thought conten t:  WNL Sensory/Perceptual disturbances:  WNL Orientation: oriented to person, place, and situation Attention: Good Concentration: Good Memory: Impaired short term Fund of knowledge:  Good Insight:  Good Judgment:  Good Impulse Control: Good  Risk Assessment: Danger to Self: No Self-injurious Behavior: No Danger to Others: No Duty to Warn:no Physical Aggression / Violence:No  Access to Firearms a concern: No  G ang Involvement:No  Treatment plan: Will employ cognitive behavioral therapy as well as grief and loss therapy for relief of anxiety and grief.  Goals of grief therapy or to have a healthier response to the loss of his daughter and less sadness as evidenced by patient report in therapy notes as well as to help him feel less overwhelmed by her loss.  Goals are to  see a 50% reduction in grief symptoms over the next 6 months.  I will enco urage the use of the patient telling his grief story about his daughter including encouraging him to bring pictures and memorabilia to help process his feelings including any feelings of guilt associated with her loss.  We will use cognitive behavioral therapy to help identify and change anxiety producing thoughts and behavior patterns so as to improve his ability to better manage anxiety and stress, and manage thoughts and worrisome th inking contributing to anxiety.  We will also refresh and encourage dialectical behavior therapy  distress tolerance and mindfulness skills with the intention of reducing anxiety by 50% over the next 6 months. Progress: 30% Interventions: Cognitive Behavioral Therapy  Diagnosis:PTSD, Bi-Polar D/O  Plan: F/U/ appointments scheduled weekly.  Lorrene CHRISTELLA Hasten, [facility]                                                                                                                    Lorrene CHRISTELLA Hasten, [facility]               Lorrene CHRISTELLA Hasten, [facility]               Lorrene CHRISTELLA Hasten, [facility]  [place] [facility] Counselor/Therapist Progress Note  Patient ID: Clance Baquero, MRN: [id number],    Date: 04/01/2024 This session was held via video teletherapy. The patient consented to the video teletherapy and was located in his home during this session. He is  aware it is the responsibility of the patient to secure confidentiality on his end of the session. The provider was in a private home office for the duration of this session.   The patient arrived on time for her Caregility session  Time Spent: 57 minutes, 2 PM to 2:57 PM  Treatment Type: Individual Therapy Reported Symptoms: anxiety, panic attacks The patient is still having significant back pain.  He cannot sit anywhere for v ery long and has to be careful where he sits.  There is still some unsteadiness although he did not report any falls over the past 2 weeks.  His biggest concern now is for his wife.  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  She is meeting with her cardiologist again next week to see what possible treatment is for her.  He still thinks there may be some i ssue with her diaphragm but she is meeting with the pulmonologist also.  He is doing what he can to help her.  His daughter's birthday is March 11 so he knows that is a difficult time  for both he and his wife next week.  Typically they do something to remember her but because of the wife's current health issues they cannot go to [place] as they had planned so they are talking about what to do to remember and celebrate her birthd ay.  We will continue to work on coping skills for helping with his anxiety and the progression of his Lewy body dementia diagnosis as well as processing his grief. Mental Status Exam: Appearance:  Well Groomed  Behavior: Appropriate Motor: Pt. Reports some instability due to Lewey Body dementia Speech/Language:  Normal Rate Affect: Appropriate Mood: normal Thought process: normal Thought content:  WNL Sensory/Perceptu al disturbances:  WNL Orientation: oriented to person, place, and situation Attention: Good Concentration: Good Memory: Impaired short term Fund of knowledge:  Good Insight:  Good Judgment:  Good Impulse Control: Good  Risk Assessment: Danger to Self: No Self-injurious Behavior: No Danger to Others: No Duty to Warn:no Physical Aggression / Violence:No  Access to Firearms a concern: No  Gang Involvement:No  Treatm ent plan: Will employ cognitive behavioral therapy as well as grief and loss therapy for relief of anxiety and grief.  Goals of grief therapy or to have a healthier response to the loss of his daughter and less sadness as evidenced by patient report in therapy notes as well as to help him feel less overwhelmed by her loss.  Goals are to see a 50% reduction in grief symptoms over the next 6 months.  I will encourage the use of the patien t telling his grief story about his daughter including encouraging him to bring pictures and memorabilia to help process his feelings including any feelings of guilt associated with her loss.  We will use cognitive behavioral therapy to help identify and change anxiety producing thoughts and behavior patterns so as to improve his ability to better manage anxiety and  stress, and manage thoughts and worrisome thinking contributing to anxi ety.  We will also refresh and encourage dialectical behavior therapy distress tolerance and mindfulness  skills with the intention of reducing anxiety by 50% over the next 6 months. Progress: 30% Interventions: Cognitive Behavioral Therapy  Diagnosis:PTSD, Bi-Polar D/O  Plan: F/U/ appointments scheduled weekly.  Lorrene CHRISTELLA Hasten, [facility]                                                                                                                    Lorrene CHRISTELLA Hasten, [facility]  [facility] Counselor/Therapist Progress Note  Patient  ID: Romaldo Saville, MRN: [id number],    Date: 04/01/2024 This session was held via video teletherapy. The patient consented to the video teletherapy and was located in his home during this session. He is aware it is the responsibility of the patient to secure confidentiality on his end of the session. The provider was in  a private home office for the duration of this session.   The patient arrived on time for her Caregility session  Time Spent: 57 minutes, 2 PM to 2:57 PM  Treatment Type: Individual Therapy Reported Symptoms: anxiety, panic attacks  The patient reported that he had not been sleeping well so he did speak with his psychiatrist about adjusting some meds.  Initially they adjusted too much up and he said he felt groggy for the enti re next day but they have found a combination now where he feels like he is getting more rested and only feels a little drowsy shortly after waking up..  There have been a couple times where he stumbled and one of the times he felt down but did not get hurt the other times he was able to catch himself.  He is trying to stay as active as he can walking daily and fishing with his wife when he can.  His biggest anxiety now is his wife has  an upcoming surgery on her diaphragm in 2 weeks so he knows that will be a fairly significant recovery time but also sees the difficulties she is having now and wants her to get some relief..  His son and daughter-in-law will be supports for them also.  He continues to speak with friends and family members as encouragement.  He also feels that he is grieving appropriately writing to his daughter on a regular basis about the things that  are going on in his and his wife's lives.  We will continue to work on coping skills for helping with his anxiety and the progression of his Lewy body dementia diagnosis as well as processing his grief. Mental Status Exam: Appearance:  Well Groomed  Behavior: Appropriate Motor: Pt.  Reports some instability due to Lewey Body dementia Speech/Language:  Normal Rate Affect: Appropriate Mood: normal Thought process: normal Tho ught content:  WNL Sensory/Perceptual disturbances:  WNL Orientation: oriented to person, place, and situation Attention: Good Concentration: Good Memory: Impaired short term Fund of knowledge:  Good Insight:  Good Judgment:  Good Impulse Control: Good  Risk Assessment: Danger to Self: No Self-injurious Behavior: No Danger to Others: No Duty to Warn:no Physical Aggression / Violence:No  Access to Firearms a conc ern: No  Gang Involvement:No  Treatment plan: Will employ cognitive behavioral therapy as well as grief and loss therapy for relief of anxiety and grief.  Goals of grief therapy or to have a healthier response to the loss of his daughter and less sadness as evidenced by patient report in therapy notes as well as to help him feel less overwhelmed by her loss.  Goals are to see a 50% reduction in grief symptoms over the next 6 months.   I will encourage the use of the patient telling his grief story about his daughter including encouraging him to bring pictures and memorabilia to help process his feelings including any feelings of guilt associated with her loss.  We will use cognitive behavioral therapy to help identify and change anxiety producing thoughts and behavior patterns so as to improve his ability to  better manage anxiety and stress, and manage thoughts and w orrisome thinking contributing to anxiety.  We will also refresh and encourage dialectical behavior therapy distress tolerance and mindfulness skills with the intention of reducing anxiety by 50% over the next 6 months. Progress: 30% Interventions: Cognitive Behavioral Therapy  Diagnosis:PTSD, Bi-Polar D/O  Plan: F/U/ appointments scheduled weekly.  Lorrene CHRISTELLA Hasten, [facility]                                                                                                                     Lorrene CHRISTELLA Hasten, [facility]               Lorrene CHRISTELLA Hasten, [facility]               Lorrene CHRISTELLA Hasten, [facility]               Lorrene CHRISTELLA Hasten, [facility]               Lorrene CHRISTELLA Hasten, [facility]               Lorrene CHRISTELLA Hasten , [facility]               Lorrene CHRISTELLA Hasten, [facility]               Lorrene CHRISTELLA Hasten, [facility]  [facility] Counselor/Therapist Progress Note  Patient ID: Treon Kehl, MRN: [id number],    Date: 04/01/2024 This session was held via video teletherapy. The patient consented  to the video teletherapy and was located in his home during this session. He is aware it is the responsibility of the patient to secure confidentiality on his end of the session. The provider was in a private home office for the duration of this session.    The patient arrived on time for her Caregility session  Time Spent: 59 minutes, 2 PM to 2:59 PM  Treatment Type: Individual Therapy Reported Symptoms: anxiety, panic attacks The patient said he jokingly said to his wife that he had not had any pain issues in the cervical area and then a few days ago he started having pain in his neck and shoulders.  He now sits related to degenerative disk and has some consistent pain somewh ere in his spine with that but jokingly says it just depends on Wednesday which part of his body is decides to show up and hurt.  He is doing the best that he can.  He is still walking which he knows is good for him but does not have right now the strength to do as much in the morning as he has done in the past so he is denying that to 3 times a day for consistency.  He is still doing some positive things and that he is staying in touch  with his brother and other family members.  He is reconnecting with friends from high school and he is which she is enjoying.  One of them has had a similar experience in that she lost her son in the same way the patient lost his daughter.  He said they did not think and Easter in which they set a place for his daughter at the table and put her picture there.  He initially had reservations about doing that he and his wife had made ever yone feel as if she was really there with him.  He still feels that he is grieving in a healthy way.  He and his wife have always taken a trip around both of their birthdays which are 2 days apart.  That is now close to his daughter died so they have started taking some of her rashes with him and sprinkling them where they go as a way of remembrance.  They are deciding  what that looks like this year in July.  He does contract for safe ty having no thoughts of hurting himself or anyone else. We will continue to work on coping skills for helping with his anxiety and the progression of his Lewy body dementia diagnosis as well as processing his grief. Mental Status Exam: Appearance:  Well Groomed  Behavior: Appropriate Motor: Pt. Reports some instability due to Lewey Body dementia Speech/Language:  Normal Rate Affect: Appropriate Mood: normal Thought process : normal Thought content:  WNL Sensory/Perceptual disturbances:  WNL Orientation: oriented to person, place, and situation Attention: Good Concentration: Good Memory: Impaired short term Fund of knowledge:  Good Insight:  Good Judgment:  Good Impulse Control: Good  Risk Assessment: Danger to Self: No Self-injurious Behavior: No Danger to Others: No Duty to Warn:no Physical Aggression / Violence:No  Access to Fi rearms a concern: No  Gang Involvement:No  Treatment plan: Will employ cognitive behavioral therapy as well as grief and loss therapy for relief of anxiety and grief.  Goals of grief  therapy or to have a healthier response to the loss of his daughter and less sadness as evidenced by patient report in therapy notes as well as to help him feel less overwhelmed by her loss.  Goals are to see a 50% reduction in grief symptoms over the nex t 6 months.  I will encourage the use of the patient telling his grief story about his daughter including encouraging him to bring pictures and memorabilia to help process his feelings including any feelings of guilt associated with her loss.  We will use cognitive behavioral therapy to help identify and change anxiety producing thoughts and behavior patterns so as to improve his ability to better manage anxiety and stress, and manage t houghts and worrisome thinking contributing to anxiety.  We will also refresh and encourage dialectical behavior  therapy distress tolerance and mindfulness skills with the intention of reducing anxiety by 50% over the next 6 months. Progress: 30% Interventions: Cognitive Behavioral Therapy  Diagnosis:PTSD, Bi-Polar D/O  Plan: F/U/ appointments scheduled weekly.  Lorrene CHRISTELLA Hasten, [facility]                                                                                                                    Lorrene CHRISTELLA Hasten, [facility]  [facility] Counselor/Therapist Progress Note  Patient ID: Jayme Cham, MRN: [id number],    Date: 04/01/2024 This session was held via video teletherapy. The patient consented to the  video teletherapy and was located in his home during this session. He is aware it is the responsibility of the patient to secure confidential ity on his end of the session. The provider was in a private home office for the duration of this session.   The patient arrived on time for her Caregility session  Time Spent: 58 minutes, 2 PM to 2:58 PM  Treatment Type: Individual Therapy Reported Symptoms: anxiety, panic attacks A fairly difficult week because it was his daughter's birthday.  He said this year was more difficult than last year but he could not pinpoint why.   They did remember her about going out to a nice restaurant which is something her daughter always wanted to do.  He said they were able to laugh and joke about funny things that his daughter had said or done which is not something they were able to do this time last year so he saw that as healthy grieving.  He knows that the time change threw things off a little bit for everybody and his wife still is not feeling well and his back has  been bothering him.  He was not able to go to his first round of physical therapy this week because his back was bothering him more.  He has been thinking a lot more about his daughter which he feels comfortable with.  He is thankful that it is getting warmer and staying light longer so he can get back to some of the coping skills such as fishing that he has done in the past.   We will continue to work on coping skills for helping wi th his anxiety and the progression of his Lewy body dementia diagnosis as well as processing his grief. Mental Status Exam: Appearance:  Well Groomed  Behavior: Appropriate Motor: Pt. Reports some instability due to Lewey Body dementia Speech/Language:  Normal Rate Affect: Appropriate Mood: normal Thought process: normal Thought content:  WNL Sensory/Perceptual disturbances:  WNL Orientation: oriented to person, place , and  situation Attention: Good Concentration: Good Memory: Impaired short term Fund of knowledge:  Good Insight:  Good Judgment:  Good Impulse Control: Good  Risk Assessment: Danger to Self: No Self-injurious Behavior: No Danger to Others: No Duty to Warn:no Physical Aggression / Violence:No  Access to Firearms a concern: No  Gang Involvement:No  Treatment plan: Will employ cognitive behavioral therapy as well as g rief and loss therapy for relief of anxiety and grief.  Goals of grief therapy or to have a healthier response to the loss of his daughter and less sadness as evidenced by patient report in therapy notes as well as to help him feel less overwhelmed by her loss.  Goals are to see a 50% reduction in grief symptoms over the next 6 months.  I will encourage the use of the patient telling his grief story about his daughter including encourag ing him to bring pictures and memorabilia to help process his feelings including any feelings of guilt associated with her loss.  We will use cognitive behavioral therapy to help identify and change anxiety producing thoughts and behavior patterns so as to improve his ability to better manage anxiety and stress, and manage thoughts and worrisome thinking contributing to anxiety.  We will also refresh and encourage dialectical behavior t herapy  distress tolerance and mindfulness skills with the intention of reducing anxiety by 50% over the next 6 months. Progress: 30% Interventions: Cognitive Behavioral Therapy  Diagnosis:PTSD, Bi-Polar D/O  Plan: F/U/ appointments scheduled weekly.  Lorrene CHRISTELLA Hasten, [facility]                                                                                                                    Lorrene CHRISTELLA Hasten, [facility]               Lorrene CHRISTELLA Hasten, [facility]               Lorrene CHRISTELLA Hasten,  [facility]  [facility] Counselor/Therapist Progress Note  Patient ID: Amante Fomby, MRN: [id number],    Date: 04/01/2024 This session was held via video teletherapy. The patient consented to the video teletherapy and was located in his home during this session. He is aware it is the responsibility of the patient to secure confidentiality on his end of the session. The  provider was in a private home office for the duration of this session.   The patient arrived on time for her Caregility session  Time Spent: 1:05 PM until 2 PM, 55 minutes Treatment Type: Individual Therapy Reported Symptoms: anxiety, panic attacks The patient said he came in from his walk this morning and saw what he thought was some sort of tic from his  wife.  It was not the first time it happened and he wonders if it might  be tardive dyskinesia because of some new medications that she is on.  She has a call into the doctor right now and hopes to hear something back soon.  They have narrowed down to the fact they think most of her discomfort in breathing is coming from the diaphragm and they are looking for alternatives for treatment.  The patient continues to do those things which help him cope including walking, fishing when he can, spending time with  his son and daughter-in-law and grandson.  He also has a way of continued coping with grief he is writing letters to his daughters as a way of expressing his feelings and says that is very beneficial.  He appears to be grieving in a healthy way but he knows that he to year anniversary of her death is coming up and there is some grief there but says it is not overwhelming.  We will continue to work on coping skills for helping with hi s anxiety and the progression of his Lewy body dementia diagnosis as well as processing his grief. Mental Status Exam: Appearance:  Well Groomed  Behavior: Appropriate Motor: Pt. Reports some instability due to Lewey Body dementia Speech/Language:  Normal Rate Affect: Appropriate Mood: normal Thought process: normal Thought content:  WNL Sensory/Perceptual disturbances:  WNL Orientation: oriented to person, place, and  situation Attention: Good Concentration: Good Memory: Impaired short term Fund of knowledge:  Good Insight:  Good Judgment:  Good Impulse Control: Good  Risk Assessment: Danger to Self: No Self-injurious Behavior: No Danger to Others: No Duty to Warn:no Physical Aggression / Violence:No  Access to Firearms a concern: No  Gang Involvement:No  Treatment plan: Will employ cognitive behavioral therapy as well as grief  and loss therapy for relief of anxiety and grief.  Goals of grief therapy or to have a healthier response to the  loss of his daughter and less sadness as evidenced by patient report in therapy notes as well as to help him feel less overwhelmed by her loss.  Goals are to see a 50% reduction in grief symptoms over the next 6 months.  I will encourage the use of the patient telling his grief story about his daughter including encouraging h im to bring pictures and memorabilia to help process his feelings including any feelings of guilt associated with her loss.  We will use cognitive behavioral therapy to help identify and change anxiety producing thoughts and behavior patterns so as to improve his ability to better manage anxiety and stress, and manage thoughts and worrisome thinking contributing to anxiety.  We will also refresh and encourage dialectical behavior therap y distress tolerance  and mindfulness skills with the intention of reducing anxiety by 50% over the next 6 months. Progress: 30% Interventions: Cognitive Behavioral Therapy  Diagnosis:PTSD, Bi-Polar D/O  Plan: F/U/ appointments scheduled weekly.  Lorrene CHRISTELLA Hasten, [facility]                                                                                                                    Lorrene CHRISTELLA Hasten, [facility]  [facility]  Counselor/Therapist Progress Note  Patient ID: Jerauld Bostwick, MRN: [id number],    Date: 04/01/2024 This session was held via video teletherapy. The patient consented to the video teletherapy and was located in his home during this session. He is aware it is the responsibility of the patient to secure confidentiality on his end of the session. The provider was in a private home office for the duration of this session.   The pa tient arrived on time for her Caregility session  Time Spent: 59 minutes, 2 PM to 2:59 PM  Treatment Type: Individual Therapy Reported Symptoms: anxiety, panic attacks The patient continues to present with physical pain and both his back and his knee.  He started with a new physical therapist and told him that there was a lot of arthritis in both knees and or certain things he cannot do but they had him do that anyway and now for t he past 3 days he has been in significant knee pain.  He has continued to walk because he knows that is good for him in various ways but says it has been painful.  His wife also was not  feeling well but she is going back to the doctor next week to have an ablation and hopes that will bring her some relief.  For the most part he has been at the last couple weeks reaching back out and hearing from some old friends as well as time with his  son and son's family.  He says that his wife's cousin is coming down for a weekend soon and they enjoy time with her and that he will go to his brother's house sometime in the next few weeks.  He recognizes the need for people that he cares about in helping with his depression. We will continue to work on coping skills for helping with his anxiety and the progression of his Lewy body dementia diagnosis as well as processing his grief.  Mental Status Exam: Appearance:  Well Groomed  Behavior: Appropriate Motor: Pt. Reports some instability due to Lewey Body dementia Speech/Language:  Normal Rate Affect: Appropriate Mood: normal Thought process: normal Thought content:  WNL Sensory/Perceptual disturbances:  WNL Orientation: oriented to person, place, and situation Attention: Good Concentration: Good Memory: Impaired short term Fund of knowledge:   Good Insight:  Good Judgment:  Good Impulse Control: Good  Risk Assessment: Danger to Self: No Self-injurious Behavior: No Danger to Others: No Duty to Warn:no Physical Aggression / Violence:No  Access to Firearms a concern: No  Gang Involvement:No  Treatment plan: Will employ cognitive behavioral therapy as well as grief and loss therapy for relief of anxiety and grief.  Goals of grief therapy or to have a healthier r esponse to the loss of his daughter and less sadness as evidenced by patient report in therapy notes as well as to help him feel less overwhelmed by her loss.  Goals are to see a 50% reduction in grief symptoms over the next 6 months.  I will encourage the use of the patient telling his grief story about his daughter including encouraging him to bring pictures and  memorabilia to help process his feelings including any feelings of guilt  associated with her loss.  We will use cognitive behavioral therapy to help identify and change anxiety producing thoughts and behavior patterns so as to improve his ability to better manage anxiety and stress, and manage thoughts and worrisome thinking contributing to anxiety.  We will  also refresh and encourage dialectical behavior therapy distress tolerance and mindfulness skills with the intention of reducing anxiety by 50% over the  next 6 months. Progress: 30% Interventions: Cognitive Behavioral Therapy  Diagnosis:PTSD, Bi-Polar D/O  Plan: F/U/ appointments scheduled weekly.  Lorrene CHRISTELLA Hasten, [facility]                                                                                                                   Lorrene CHRISTELLA Hasten, [facility]                Lorrene CHRISTELLA Hasten, [facility]               Lorrene CHRISTELLA Hasten, [facility]               Lorrene CHRISTELLA Hasten, [facility]  [place] [facility] Counselor/Therapist Progress Note  Patient ID: Jacody Beneke, MRN: [id number],    Date: 04/01/2024 This session was held via video teletherapy. The patient consented to the video teletherapy and was located in his home during this session. He is aware it is the responsibility of the patient to secure confidentiality on his end of the session. The provider was in a private home office f or the duration of this session.   The patient arrived on time for her Caregility session  Time Spent: 57 minutes, 2 PM to 2:57 PM  Treatment Type: Individual Therapy Reported Symptoms: anxiety, panic attacks The patient is still having significant back pain.  He cannot sit anywhere for very long and has to be careful where he sits.  There is still some unsteadiness although he did not report any falls over the past 2 weeks.  H is biggest concern now is for his wife.  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  She is meeting with her cardiologist again next week to see what possible treatment is for her.  He still thinks there may be some issue with her diaphragm but she is meeting with the pulmonologist also.  He is doing what he can to help her.  His daughter's birthday is [birth year] r[birth year] 15 so he knows that is a difficult time for both he and his wife next week.  Typically they do something to remember her but because of the wife's current health issues they cannot  go to [place] as they had planned so they are talking about what to do to remember and celebrate her birthday.  We will continue to work on coping skills for helping with his anxiety and the progression of his Lewy body dementia diagnosis as well  as processing his grief. Mental Status Exam: Appearance:  Well Groomed  Behavior: Appropriate Motor: Pt. Reports some instability due to Lewey Body dementia Speech/Language:  Normal Rate Affect: Appropriate Mood: normal Thought process: normal Thought content:  WNL Sensory/Perceptual disturbances:  WNL Orientation: oriented to person, place, and situation Attention: Good Concentration: Good Memory: Impaired short t erm Fund of knowledge:  Good Insight:  Good Judgment:  Good Impulse Control: Good  Risk Assessment: Danger to Self: No Self-injurious Behavior: No Danger to Others: No Duty to Warn:no Physical Aggression / Violence:No  Access to Firearms a concern: No  Gang Involvement:No  Treatment plan: Will employ cognitive behavioral therapy as well as grief and loss therapy for relief of anxiety and grief.  Goals of grief therapy  or to have a healthier response to the loss of his daughter and less sadness as evidenced by patient report in therapy notes as well as to help him feel less overwhelmed by her loss.  Goals are to see a 50% reduction in grief symptoms over the next 6 months.  I will encourage the use of the patient telling his grief story about his daughter including encouraging him to bring pictures and memorabilia to help process his feelings includin g any feelings of guilt associated with her loss.  We will use cognitive behavioral therapy to help identify and change anxiety producing thoughts and behavior patterns so as to improve his ability to better manage anxiety and stress, and manage thoughts and worrisome thinking contributing to anxiety.  We will also refresh and encourage dialectical behavior therapy  distress tolerance and mindfulness skills  with the intention of reducing  anxiety by 50% over the next 6 months. Progress: 30% Interventions: Cognitive Behavioral Therapy  Diagnosis:PTSD, Bi-Polar D/O  Plan: F/U/ appointments scheduled weekly.  Lorrene CHRISTELLA Hasten, [facility]                                                                                                                   Lorrene CHRISTELLA SHAUNNA dyana, [facility]  [place] [facility] Counselor/Therapist Progress Note  Patient ID: Theopolis Sloop, MRN: [id number],    Date: 04/01/2024 This session was held via video teletherapy. The patient consented to the video  teletherapy and was located in his home during this session. He is aware it is the responsibility of the patient to secure confidentiality on his end of the session. The provider was in a private home office for the duration of this session.   The patient arrived on time for her Caregility session  Time Spent: 58 minutes , 2 PM to 2:58 PM  Treatment Type: Individual Therapy Reported Symptoms: anxiety, panic attacks A fairly difficult week because it was his daughter's birthday.  He said this year was more difficult than last year but he could not pinpoint why.  They did remember her about going out to a nice restaurant which is something her daughter always wanted to do.  He said they were able to laugh and joke about funny things that his daughter h ad said or done which is not something they were able to do this time last year so he saw that as healthy grieving.  He knows that the time change threw things off a little bit for everybody and his wife still is not feeling well and his back has been bothering him.  He was not able to go to his first round of physical therapy this week because his back was bothering him more.  He has been thinking a lot more about his daughter which he  feels comfortable with.  He is thankful that it is getting warmer and staying light longer so he can get back to some of the coping skills such as fishing that he has done in the past.   We will continue to work on coping skills for helping with his anxiety and the progression of his Lewy body dementia diagnosis as well as processing his grief. Mental Status Exam: Appearance:  Well Groomed  Behavior: Appropriate Motor: Pt. R eports some instability due to Lewey Body dementia Speech/Language:  Normal Rate Affect: Appropriate Mood: normal Thought process: normal Thought content:  WNL Sensory/Perceptual disturbances:  WNL Orientation: oriented to person, place, and  situation Attention: Good Concentration: Good Memory: Impaired short term Fund of knowledge:  Good Insight:  Good Judgment:  Good Impulse Control: Good  Risk Assessment: Dange r to Self: No Self-injurious Behavior: No Danger to Others: No Duty to Warn:no Physical Aggression / Violence:No  Access to Firearms a concern: No  Gang Involvement:No  Treatment plan: Will employ cognitive behavioral therapy as well as grief and loss therapy for relief of anxiety and grief.  Goals of grief therapy or to have a healthier response to the loss of his daughter and less sadness as evidenced by patient report in the rapy notes as well as to help him feel less overwhelmed by her loss.  Goals are to see a 50% reduction in grief symptoms over the next 6 months.  I will encourage the use of the patient telling his grief story about his daughter including encouraging him to bring pictures and memorabilia to help process his feelings including any feelings of guilt associated with her loss.  We will use cognitive behavioral therapy to help identify and c hange anxiety producing thoughts and behavior patterns so as to improve his ability to better manage anxiety and stress, and manage thoughts and worrisome thinking contributing to anxiety.  We will also refresh and encourage dialectical behavior therapy  distress tolerance and mindfulness skills with the intention of reducing anxiety by 50% over the next 6 months. Progress: 30% Interventions: Cognitive Behavioral Therapy  Diagnosis:P TSD, Bi-Polar D/O  Plan: F/U/ appointments scheduled weekly.  Lorrene CHRISTELLA Hasten, [facility]                                                                                                                   Lorrene CHRISTELLA Hasten, [facility]               Lorrene CHRISTELLA Hasten, [facility]               Lorrene CHRISTELLA Hasten,  [facility]  [place] [facility] Counselor/Therapist Progress Note  Patient ID: Jasiel Belisle, MRN: [id number],    Date: 5 /30/2025 This session was held via video teletherapy. The patient consented to the video teletherapy and was located in his home during this session. He is aware it is the responsibility of the patient to secure confidentiality on his end of the session. The provider was in a private home office for the duration of this session.   The patient arrived on time for her Caregility session  Time Spent: 57 minutes, 2 PM to 2:57 PM  Tr eatment Type: Individual Therapy Reported Symptoms: anxiety, panic attacks The patient is still having significant back pain.  He cannot sit anywhere for very long and has to be  careful where he sits.  There is still some unsteadiness although he did not report any falls over the past 2 weeks.  His biggest concern now is for his wife.  She has been having some difficulty breathing and they did a test so they found a small hole on the  back side of her heart.  She is meeting with her cardiologist again next week to see what possible treatment is for her.  He still thinks there may be some issue with her diaphragm but she is meeting with the pulmonologist also.  He is doing what he can to help her.  His daughter's birthday is March 11 so he knows that is a difficult time for both he and his wife next week.  Typically they do something to remember her but because of t he wife's current health issues they cannot go to [place] as they had planned so they are talking about what to do to remember and celebrate her birthday.  We will continue to work on coping skills for helping with his anxiety and the progression of his Lewy body dementia diagnosis as well as processing his grief. Mental Status Exam: Appearance:  Well Groomed  Behavior: Appropriate Motor: Pt. Reports some instability due  to Lewey Body dementia Speech/Language:  Normal Rate Affect: Appropriate Mood: normal Thought process: normal Thought content:  WNL Sensory/Perceptual disturbances:  WNL Orientation: oriented to person, place, and situation Attention: Good Concentration: Good Memory: Impaired short term Fund of knowledge:  Good Insight:  Good Judgment:  Good Impulse Control: Good  Risk Assessment: Danger to Self: No Self-injur ious Behavior: No Danger to Others: No Duty to Warn:no Physical Aggression / Violence:No  Access to Firearms a concern: No  Gang Involvement:No  Treatment plan: Will employ cognitive behavioral therapy as well as grief and loss therapy for relief of anxiety and grief.  Goals of grief therapy or to have a healthier response to the loss of his daughter and  less sadness as evidenced by patient report in therapy notes as well as to he lp him feel less overwhelmed by her loss.  Goals are to see a 50% reduction in grief symptoms over the next 6 months.  I will encourage the use of the patient telling his grief story about his daughter including encouraging him to bring pictures and memorabilia to help process his feelings including any feelings of guilt associated with her loss.  We will use cognitive behavioral therapy to help identify and change anxiety producing tho ughts and behavior patterns so as to improve his ability to better manage anxiety and stress, and manage thoughts and worrisome thinking contributing to anxiety.  We will also refresh and encourage dialectical behavior therapy distress tolerance and mindfulness  skills with the intention of reducing anxiety by 50% over the next 6 months. Progress: 30% Interventions: Cognitive Behavioral Therapy  Diagnosis:PTSD, Bi-Polar D/O  Plan:  F/U/ appointments scheduled weekly.  Lorrene CHRISTELLA Hasten, [facility]                                                                                                                   Lorrene CHRISTELLA Hasten, [facility]  [place] [facility] Counselor/Therapist Progress Note  Patient ID: Fawaz Borquez, MRN: [id number],    Date: 04/01/2024 This session was held via video teletherapy. The patient consented to the video teletherapy and was located i n his home during this session. He is aware it is the responsibility of the patient to secure confidentiality on his end of the session. The provider was in a private home office for the duration of this session.   The patient arrived on time for her Caregility session  Time Spent: 57 minutes, 2 PM to 2:57 PM  Treatment Type: Individual Therapy Reported Symptoms: anxiety, panic attacks  We will continue to work on coping skill s for helping with his anxiety and the progression of his Lewy body dementia diagnosis as well as processing his grief. Mental Status Exam: Appearance:  Well Groomed  Behavior: Appropriate Motor: Pt. Reports some instability due to Lewey Body dementia Speech/Language:  Normal Rate Affect: Appropriate Mood: normal Thought process: normal Thought content:  WNL Sensory/Perceptual disturbances:  WNL Orientation: oriented  to person, place, and situation Attention: Good Concentration: Good Memory: Impaired short term Fund of knowledge:  Good Insight:  Good Judgment:  Good Impulse  Control: Good  Risk Assessment: Danger to Self: No Self-injurious Behavior: No Danger to Others: No Duty to Warn:no Physical Aggression / Violence:No  Access to Firearms a concern: No  Gang Involvement:No  Treatment plan: Will employ cognitive behavioral ther apy as well as grief and loss therapy for relief of anxiety and grief.  Goals of grief therapy or to have a healthier response to the loss of his daughter and less sadness as evidenced by patient report in therapy notes as well as to help him feel less overwhelmed by her loss.  Goals are to see a 50% reduction in grief symptoms over the next 6 months.  I will encourage the use of the patient telling his grief story about his daughter in cluding encouraging him to bring pictures and memorabilia to help process his feelings including any feelings of guilt associated with her loss.  We will use cognitive behavioral therapy to help identify and change anxiety producing thoughts and behavior patterns so as to improve his ability to better manage anxiety and stress, and manage thoughts and worrisome thinking contributing to anxiety.  We will also refresh and encourage dialec tical behavior therapy distress tolerance and mindfulness skills with the intention of reducing anxiety by 50% over the next 6 months. Progress: 30% Interventions: Cognitive Behavioral Therapy  Diagnosis:PTSD, Bi-Polar D/O  Plan: F/U/ appointments scheduled weekly.  Lorrene CHRISTELLA Hasten, [facility]                                                                                                                    Lorrene CHRISTELLA Hasten, [facility]               Lorrene CHRISTELLA Hasten, [facility]               Lorrene CHRISTELLA Hasten, [facility]  Lorrene CHRISTELLA Hasten, [facility]               Lorrene CHRISTELLA Hasten, [facility]               Lorrene CHRISTELLA Hasten, [facility]               Lorrene CHRISTELLA Hasten, [facility]  [place] [facility] Counselor/Therapist Progress Note  Patient ID: Zaccheus Edmister, MRN: [id number],    Date: 04/01/2024 This session was  held via video teletherapy. The patient consented to the video teletherapy and was located in his home during this session. He is aware it is the responsibility of the patient to secure confidentiality on his end of the session. The provider was in a private home office for the duration of this session.   The patient arrived on time for her Caregility session  Time Spent: 39 minutes, 2 PM to 2:39 PM  Treatment Type: Individual Th erapy Reported Symptoms: anxiety, panic attacks The patient was not feeling well today.  He  started feeling really earlier in the week having a fever bad headaches body aches.  He felt a little better yesterday but feels a little bit of a resurgence in headaches and body aches and is running a low-grade fever.  His wife is feeling some of the same symptoms.  For the most part he says he is doing well.  They did increase his memory med ication because they were starting to see some increasing decline in short-term memory and he said until recently his long-term memory had been good but he was starting to see a difference in how he used to markers to remember what happened at certain times historically.  He feels the medication has helped and has brightened his mood a little also.  He is still trying to walk every day and fish when he can and has enjoyed watching the [employer] golf tournament as a good distraction.  He reports that he feels he is managing mood fairly well.  He has an appointment in 2 weeks.  He does contract for safety having no thoughts of hurting himself or anyone else. We will continue to work on coping skills for helping with his anxiety and the progression of his Lewy body dementia diagnosis as well as processing his grief. 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 Speech/Language:  Normal Rate Affect: Appropriate Mood: normal Thought process: normal Thought content:  WNL Sensory/Perceptual disturbances:  WNL Orientation: oriented to person, place, and situation Attention: Good Concentration: Good Memory: Impaired short term Fund of knowledge:  Good Insight:  Good Judgment:  Good Impulse Contr ol: Good  Risk Assessment: Danger to Self: No Self-injurious Behavior: No Danger to Others: No Duty to Warn:no Physical Aggression / Violence:No  Access to Firearms a concern: No  Gang Involvement:No  Treatment plan: Will employ cognitive behavioral therapy as well  as grief and loss therapy for relief of anxiety and grief.  Goals of grief therapy or to have a healthier response to the loss of his daughter and less sadness as  evidenced by patient report in therapy notes as well as to help him feel less overwhelmed by her loss.  Goals are to see a 50% reduction in grief symptoms over the next 6 months.  I will encourage the use of the patient telling his grief story about his daughter including encouraging him to bring pictures and memorabilia to help process his feelings including any feelings of guilt associated with her loss.  We will use cognitive behavi oral therapy to help identify and change anxiety producing thoughts and behavior patterns so as to improve his ability to better manage anxiety and stress, and manage thoughts and worrisome thinking contributing to anxiety.  We will also refresh  and encourage dialectical behavior therapy distress tolerance and mindfulness skills with the intention of reducing anxiety by 50% over the next 6 months. Progress: 30% Interventions: Cognitiv e Behavioral Therapy  Diagnosis:PTSD, Bi-Polar D/O  Plan: F/U/ appointments scheduled weekly.  Lorrene CHRISTELLA Hasten, [facility]                                                                                                                   Lorrene CHRISTELLA Hasten, [facility]  [place] [facility] Counselor/Therapist Progress Note  Patient ID: Hooper Petteway, MRN: [id number],    Date: 04/01/2024 This session was held via video teletherapy. Th e patient consented to the video teletherapy and was located in his home during this session. He is aware it is the responsibility of the patient to secure confidentiality on his end of the session. The provider was in a private home office for the duration of this session.   The patient arrived on time for her Caregility session  Time Spent: 58 minutes, 2 PM to 2:58 PM  Treatment Type: Individual Therapy Reported Symptoms: anxi ety, panic attacks A fairly difficult week because it was his daughter's birthday.  He said this year was more difficult than last year but he could not pinpoint why.  They did remember her about going out to a nice restaurant which is something her daughter always wanted to do.  He said they were able to laugh and joke about funny things that his daughter had said or done which is not something they were able to do this time last year  so he saw that as healthy grieving.  He knows that the time change threw things off a little bit for everybody and his wife still is not feeling well and his back has been bothering  him.  He was not able to go to his first round of physical therapy this week because his back was bothering him more.  He has been thinking a lot more about his daughter which he feels comfortable with.  He is thankful that it is getting warmer and staying  light longer so he can get back to some of the coping skills such as fishing that he has done in the past.   We will continue to work on coping skills for helping with his anxiety and the progression of his Lewy body dementia diagnosis as well as processing his grief. Mental Status Exam: Appearance:  Well Groomed  Behavior: Appropriate Motor: Pt. Reports some instability due to Lewey Body dementia Speech/Language:  Normal Ra te Affect: Appropriate Mood: normal Thought process: normal Thought content:  WNL Sensory/Perceptual disturbances:  WNL Orientation: oriented to person, place, and situation Attention: Good Concentration: Good Memory: Impaired short term Fund of knowledge:  Good Insight:  Good Judgment:  Good Impulse Control: Good  Risk Assessment: Danger to Self: No Self-injurious Behavior: No Danger to Others: No Duty to War n:no Physical Aggression / Violence:No  Access to Firearms a concern: No  Gang Involvement:No  Treatment plan: Will employ cognitive behavioral therapy as well as grief and loss therapy for relief of anxiety and grief.  Goals of grief therapy or to have a healthier response to the loss of his daughter and less sadness as evidenced by patient report in therapy notes as well as to help him feel less overwhelmed by her loss.  Goals are  to see a 50% reduction in grief symptoms over the next 6 months.  I will encourage the use of the patient telling his grief story about his daughter including encouraging him to bring pictures and memorabilia to help process his feelings including any feelings of guilt associated with her loss.  We will use cognitive behavioral therapy to help identify and change  anxiety producing thoughts and behavior patterns so as to improve his abi lity to better manage anxiety and stress, and manage thoughts and worrisome thinking contributing to anxiety.  We will also refresh and encourage dialectical behavior therapy  distress tolerance and mindfulness skills with the intention of reducing anxiety by 50% over the next 6 months. Progress: 30% Interventions: Cognitive Behavioral Therapy  Diagnosis:PTSD, Bi-Polar D/O  Plan: F/U/ appointments scheduled weekly.  Lorrene CHRISTELLA Coy s, [facility]                                                                                                                   Lorrene CHRISTELLA Hasten, [facility]               Lorrene CHRISTELLA Hasten, [facility]               Lorrene CHRISTELLA Hasten, [facility]  [place] [facility] Counselor/Therapist Progress Note  Patient ID: Domanic Matusek, MRN: [id number],    Date: 04/01/2024 This session was held via video teletherapy. The patient consented to the video t eletherapy and was located in his home during this session. He is aware it is the responsibility of the patient to secure confidentiality on his end of the session. The provider was in a private home office for the duration of this session.   The patient arrived on time for her Caregility session  Time Spent: 1:05 PM until 2 PM, 55 minutes Treatment Type: Individual Therapy Reported Symptoms: anxiety, panic attacks The patient  said he came in from his walk this morning and saw what he thought was some sort of tic from his wife.  It was not the first time it happened and he wonders if it might be tardive dyskinesia because of some new medications that she is on.  She has a call into the doctor right now and hopes to hear something back soon.  They have narrowed down to the fact they think most of her discomfort in breathing is coming from the diaphragm and the y are looking for alternatives for treatment.  The patient continues to do those things which help him cope including walking, fishing when he can, spending time with his son and daughter-in-law and grandson.  He also has a way of continued coping with grief he is writing letters to his daughters as a way of expressing his feelings and says that is very beneficial.  He appears to be grieving in a healthy way but he knows that he to ye ar anniversary of her death is coming up and there is some grief there but says it is not overwhelming.  We will continue to work on coping skills for helping with his anxiety and the  progression of his Lewy body dementia diagnosis as well as processing his grief. Mental Status Exam: Appearance:  Well Groomed  Behavior: Appropriate Motor: Pt. Reports some instability due to Lewey Body dementia Speech/Language:  Normal Rate A ffect: Appropriate Mood: normal Thought process: normal Thought content:  WNL Sensory/Perceptual disturbances:  WNL Orientation: oriented to person, place, and situation Attention: Good Concentration: Good Memory: Impaired short term Fund of knowledge:  Good Insight:  Good Judgment:  Good Impulse Control: Good  Risk Assessment: Danger to Self: No Self-injurious Behavior: No Danger to Others: No Duty to Warn:no  Physical Aggression / Violence:No  Access to Firearms a concern: No  Gang Involvement:No  Treatment plan: Will employ cognitive behavioral therapy as well as grief and loss therapy for relief of anxiety and grief.  Goals of grief therapy or to have a healthier response to the loss of his daughter and less sadness as evidenced by patient report in therapy notes as well as to help him feel less overwhelmed by her loss.  Goals are to s ee a 50% reduction in grief symptoms over the next 6 months.  I will encourage the use of the patient telling his grief story about his daughter including encouraging him to bring pictures and memorabilia to help process his feelings including any feelings of guilt associated with her loss.  We will use cognitive behavioral therapy to help identify and change anxiety producing thoughts and behavior patterns so as to improve his ability  to better manage anxiety and stress, and manage thoughts and worrisome thinking contributing to anxiety.  We will also refresh and encourage dialectical behavior therapy distress tolerance and  mindfulness skills with the intention of reducing anxiety by 50% over the next 6 months. Progress: 30% Interventions: Cognitive Behavioral Therapy  Diagnosis:PTSD, Bi-Polar  D/O  Plan: F/U/ appointments scheduled weekly.  Lorrene CHRISTELLA Hasten, [facility]                                                                                                                   Lorrene CHRISTELLA Hasten, [facility]  [place] [facility] Counselor/Therapist Progress Note  Patient ID: Bearl Talarico, MRN: [id number],    Date: 04/01/2024 This session was held via video teletherapy. The patient consented to the video teletherapy and was located in his home during this session. He is aware it is the res ponsibility of the patient to secure confidentiality on his end of the session. The provider was in a private home  office for the duration of this session.   The patient arrived on time for her Caregility session  Time Spent: 59 minutes, 2 PM to 2:59 PM  Treatment Type: Individual Therapy Reported Symptoms: anxiety, panic attacks The patient continues to present with physical pain and both his back and his knee.  He started wit h a new physical therapist and told him that there was a lot of arthritis in both knees and or certain things he cannot do but they had him do that anyway and now for the past 3 days he has been in significant knee pain.  He has continued to walk because he knows that is good for him in various ways but says it has been painful.  His wife also was not feeling well but she is going back to the doctor next week to have an ablation and hop es that will bring her some relief.  For the most part he has been at the last couple weeks reaching back out and hearing from some old friends as well as time with his son and son's family.  He says that his wife's cousin is coming down for a weekend soon and they enjoy time with her and that he will go to his brother's house sometime in the next few weeks.  He recognizes the need for people that he cares about in helping with his depr ession. We will continue to work on coping skills for helping with his anxiety and the progression of his Lewy body dementia diagnosis as well as processing his grief. Mental Status Exam: Appearance:  Well Groomed  Behavior: Appropriate Motor: Pt. Reports some instability due to Lewey Body dementia Speech/Language:  Normal Rate Affect: Appropriate Mood: normal Thought process: normal Thought content:  WNL Sensory/Percep tual disturbances:  WNL Orientation: oriented to person, place, and situation Attention: Good Concentration: Good Memory: Impaired short term Fund of knowledge:  Good Insight:  Good Judgment:  Good Impulse Control: Good  Risk Assessment: Danger to Self: No Self-injurious Behavior:  No Danger to Others: No Duty to Warn:no Physical Aggression / Violence:No  Access to Firearms a concern: No  Gang Involvement:No  Trea tment plan: Will employ cognitive behavioral therapy as well as grief and loss therapy for relief of anxiety and grief.  Goals of grief therapy or to have a healthier response to the loss of his daughter and less sadness as evidenced by patient report in therapy notes as well as to help him feel less overwhelmed by her loss.  Goals are to see a 50% reduction in grief symptoms over the next 6 months.  I will encourage the use of the pati ent telling his grief story about his daughter including encouraging him to bring pictures and memorabilia to help process his feelings including any feelings of guilt associated with her loss.  We will use cognitive behavioral therapy to help identify and change anxiety producing thoughts and behavior patterns so as to improve his ability to better manage anxiety and stress, and manage thoughts and worrisome thinking contributing to an [place].  We will  also refresh and encourage dialectical behavior therapy distress tolerance and mindfulness skills with the intention of reducing anxiety by 50% over the next 6 months. Progress: 30% Interventions: Cognitive Behavioral Therapy  Diagnosis:PTSD, Bi-Polar D/O  Plan: F/U/ appointments scheduled weekly.  Lorrene CHRISTELLA Hasten, [facility]                                                                                                                    Lorrene CHRISTELLA Hasten, [facility]               Lorrene CHRISTELLA Hasten, [facility]               Lorrene CHRISTELLA Hasten, [facility]               Lorrene CHRISTELLA Hasten, [facility]  [place] [facility] Counselor/Therapist Progress Note  Patient ID: Jai Steil, MRN: [id number],    Date: 04/01/2024 This session was held via video teletherapy. The patient consented to the video teletherapy and was located in his home d uring this session. He is aware it is the responsibility of the patient to secure confidentiality on his end of the session. The provider was in a private home office for the duration of this session.   The patient arrived on time for her Caregility session  Time Spent: 57 minutes, 2 PM to 2:57 PM  Treatment Type: Individual Therapy Reported Symptoms: anxiety, panic attacks The patient is still having significant back pain.  He  cannot sit anywhere for very long and has to be careful where he sits.  There is still some unsteadiness although he did not report any falls over the past 2 weeks.  His biggest  concern now is for his wife.  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  She is meeting with her cardiologist again next week to see what possible treatment is for her.  He still  thinks there may be some issue with her diaphragm but she is meeting with the pulmonologist also.  He is doing what he can to help her.  His daughter's birthday is March 11 so he knows that is a difficult time for both he and his wife next week.  Typically they do something to remember her but because of the wife's current health issues they cannot go to [place] as they had planned so they are talking about what to do to remembe r and celebrate her birthday.  We will continue to work on coping skills for helping with his anxiety and the progression of his Lewy body dementia diagnosis as well as processing his grief. Mental Status Exam: Appearance:  Well Groomed  Behavior: Appropriate Motor: Pt. Reports some instability due to Lewey Body dementia Speech/Language:  Normal Rate Affect: Appropriate Mood: normal Thought process: normal Thought content :  WNL Sensory/Perceptual disturbances:  WNL Orientation: oriented to person, place, and situation Attention: Good Concentration: Good Memory: Impaired short term Fund of knowledge:  Good Insight:  Good Judgment:  Good Impulse Control: Good  Risk Assessment: Danger to Self: No Self-injurious Behavior: No Danger to Others: No Duty to Warn:no Physical Aggression / Violence:No  Access to Firearms a concern: No  Ga ng Involvement:No  Treatment plan: Will employ cognitive behavioral therapy as well as grief and loss therapy for relief of anxiety and grief.  Goals of grief therapy or to have a healthier response to the loss of his daughter and less sadness as evidenced by patient report in therapy notes as well as to help him feel less overwhelmed by her loss.  Goals are to  see a 50% reduction in grief symptoms over the next 6 months.  I will encou rage the use of the patient telling his grief story about his daughter including encouraging him to bring pictures and memorabilia to help process his feelings including any feelings of guilt associated with her loss.  We will use cognitive behavioral therapy to help identify and change anxiety producing thoughts and behavior patterns so as to improve his ability to better manage anxiety and stress, and manage thoughts and worrisome thi nking contributing to anxiety.  We will also refresh and encourage dialectical behavior therapy distress tolerance and mindfulness  skills with the intention of reducing anxiety by 50% over the next 6 months. Progress: 30% Interventions: Cognitive Behavioral Therapy  Diagnosis:PTSD, Bi-Polar D/O  Plan: F/U/ appointments scheduled weekly.  Lorrene CHRISTELLA Hasten, [facility]                                                                                                                    Lorrene CHRISTELLA Hasten, [facility]  [place] [facility] Counselor/Therapist Progress Note  Patient ID: Adison Jerger, MRN: [id number],    Date: 04/01/2024 This session was held via video teletherapy. The patient consented to the video teletherapy and was located in his home during this session. He is aware it is the responsibility of the patient to secure confidentiality on his end of the ses sion. The provider was in a private home office for the duration of this session.   The patient arrived on time for her Caregility session  Time Spent: 58 minutes, 2 PM to 2:58 PM  Treatment Type: Individual Therapy Reported Symptoms: anxiety, panic attacks A fairly difficult week because it was his daughter's birthday.  He said this year was more difficult than last year but he could not pinpoint why.  They did remember her ab out going out to a nice restaurant which is something her daughter always wanted to do.  He said they were able to laugh and joke about funny things that his daughter had said or done which is not something they were able to do this time last year so he saw that as healthy grieving.  He knows that the time change threw things off a little bit for everybody and his wife still is not feeling well and his back has been bothering him.  He w as not able to go to his first round of physical therapy this week because his back was bothering him more.  He has been thinking a lot more about his daughter which he feels  comfortable with.  He is thankful that it is getting warmer and staying light longer so he can get back to some of the coping skills such as fishing that he has done in the past.   We will continue to work on coping skills for helping with his anxiety and the pr ogression of his Lewy body dementia diagnosis as well as processing his grief. Mental Status Exam: Appearance:  Well Groomed  Behavior: Appropriate Motor: Pt. Reports some instability due to Lewey Body dementia Speech/Language:  Normal Rate Affect: Appropriate Mood: normal Thought process: normal Thought content:  WNL Sensory/Perceptual disturbances:  WNL Orientation: oriented to person, place, and situation Attentio n: Good Concentration: Good Memory: Impaired short term Fund of knowledge:  Good Insight:  Good Judgment:  Good Impulse Control: Good  Risk Assessment: Danger to Self: No Self-injurious Behavior: No Danger to Others: No Duty to Warn:no Physical Aggression / Violence:No  Access to Firearms a concern: No  Gang Involvement:No  Treatment plan: Will employ cognitive behavioral therapy as well as grief and loss therapy for  relief of anxiety and grief.  Goals of grief therapy or to have a healthier response to the loss of his daughter and less sadness as evidenced by patient report in therapy notes as well as to help him feel less overwhelmed by her loss.  Goals are to see a 50% reduction in grief symptoms over the next 6 months.  I will encourage the use of the patient telling his grief story about his daughter including encouraging him to bring pictures  and memorabilia to help process his feelings including any feelings of guilt associated with her loss.  We will use cognitive behavioral therapy to help identify and change anxiety producing thoughts and behavior patterns so as to improve his ability to better manage anxiety and stress, and manage thoughts and worrisome thinking contributing to anxiety.  We  will also refresh and encourage dialectical behavior therapy distress  tolerance  and mindfulness skills with the intention of reducing anxiety by 50% over the next 6 months. Progress: 30% Interventions: Cognitive Behavioral Therapy  Diagnosis:PTSD, Bi-Polar D/O  Plan: F/U/ appointments scheduled weekly.  Lorrene CHRISTELLA Hasten, [facility]                                                                                                                    Lorrene CHRISTELLA Hasten, [facility]               Lorrene CHRISTELLA Hasten, [facility]               Lorrene CHRISTELLA Hasten, [facility]  [place] [facility] Counselor/Therapist Progress  Note  Patient ID: Laray Corbit, MRN: [id number],    Date: 04/01/2024 This session was held via video teletherapy. The patient consented to the video teletherapy and was located in his home during this session. He is aware it is the responsibility of the patient to secure confidentiality on his end of the session. The provider was  in a private home office for the duration of this session.   The patient arrived on time for her Caregility session  Time Spent: 57 minutes, 2 PM to 2:57 PM  Treatment Type: Individual Therapy Reported Symptoms: anxiety, panic attacks The patient is still having significant back pain.  He cannot sit anywhere for very long and has to be careful where he sits.  There is still some unsteadiness although he did not report any fall s over the past 2 weeks.  His biggest concern now is for his wife.  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  She is meeting with her cardiologist again next week to see what possible treatment is for her.  He still thinks there may be some issue with her diaphragm but she is meeting with the pulmonologist also.  He is doing what he can to help her.  Hi s daughter's birthday is March 11 so he knows that is a difficult time for both he and his wife next week.  Typically they do something to remember her but because of the wife's current health issues they cannot go to [place] as they had planned so they are talking about what to do to remember and celebrate her birthday.  We will continue to work on coping skills for helping with his anxiety and the progression of his Lewy body  dementia diagnosis as well as processing his grief. Mental Status Exam: Appearance:  Well Groomed  Behavior: Appropriate Motor: Pt. Reports some instability due to Lewey Body dementia Speech/Language:  Normal Rate Affect: Appropriate Mood: normal Thought  process: normal Thought content:  WNL Sensory/Perceptual disturbances:  WNL Orientation: oriented to person, place, and situation Attention: Good Concentration: Goo d Memory: Impaired short term Fund of knowledge:  Good Insight:  Good Judgment:  Good Impulse Control: Good  Risk Assessment: Danger to Self: No Self-injurious Behavior: No Danger to Others: No Duty to Warn:no Physical Aggression / Violence:No  Access to Firearms a concern: No  Gang Involvement:No  Treatment plan: Will employ cognitive behavioral therapy as well as grief and loss therapy for relief of anxiety and grie f.  Goals of grief therapy or to have a healthier response to the loss of his daughter and less sadness as evidenced by patient report in therapy notes as well as to help him feel less overwhelmed by her loss.  Goals are to see a 50% reduction in grief symptoms over the next 6 months.  I will encourage the use of the patient telling his grief story about his daughter including encouraging him to bring pictures and memorabilia to help pr ocess his feelings including any feelings of guilt associated with her loss.  We will use cognitive behavioral therapy to help identify and change anxiety producing thoughts and behavior patterns so as to improve his ability to better manage anxiety and stress, and manage thoughts and worrisome thinking contributing to anxiety.  We will also refresh and encourage dialectical behavior therapy distress tolerance and mindfulness skills  wit h the intention of reducing anxiety by 50% over the next 6 months. Progress: 30% Interventions: Cognitive Behavioral Therapy  Diagnosis:PTSD, Bi-Polar D/O  Plan: F/U/ appointments scheduled weekly.  Lorrene CHRISTELLA Hasten, [facility]                                                                                                                     Lorrene CHRISTELLA Hasten, [facility]  [place] [facility] Counselor/Therapist Progress Note  Patient ID : Page Lancon, MRN: [id number],    Date: 04/01/2024 This session was held via video teletherapy. The patient consented to the video teletherapy and was located in his home during this session. He is aware it is the responsibility of the patient to secure confidentiality on his end of the session. The provider was in a private home office for the duration of this session.   The patient arrived on time for her Caregility sessio n  Time Spent: 57 minutes, 2 PM to 2:57 PM  Treatment Type: Individual Therapy Reported Symptoms: anxiety, panic attacks That was a situation earlier in the week when the patient's  neighbor was banging on his back door and window.  There had been situations where the same neighbor had said things and use profanity directed at his wife feeling that the patient and his wife and let their dog use the bathroom in his yard.  The patient  said they are very careful because her dog is old and did not think that happen but did not appreciate how they were handled things.  He said he got angry very quickly and did not handle it the way that he wanted to.  The situation has been resolved but the patient has had very clear boundaries with his neighbor. His wife continues to progress well from her surgery.  Physically the patient is doing fairly well.  He had to see one of h is brothers this week.  He is spending time with his son and son's family.  He has gotten some retirement/insurance things works out for the most part and is glad to have that settled. We will continue to work on coping skills for helping with his anxiety and the progression of his Lewy body dementia diagnosis as well as processing his grief. Mental Status Exam: Appearance:  Well Groomed  Behavior: Appropriate Motor: Pt. Report s some instability due to Lewey Body dementia Speech/Language:  Normal Rate Affect: Appropriate Mood: normal Thought process: normal Thought content:  WNL Sensory/Perceptual disturbances:  WNL Orientation: oriented to person, place, and situation Attention: Good Concentration: Good Memory: Impaired short term Fund of knowledge:  Good Insight:  Good Judgment:  Good Impulse Control: Good  Risk Assessment: Danger to  Self: No Self-injurious Behavior: No Danger to Others: No Duty to Warn:no Physical Aggression / Violence:No  Access to Firearms a concern: No  Gang Involvement:No  Treatment plan: Will employ cognitive behavioral therapy as well as grief and loss therapy for relief of anxiety and grief.  Goals of grief therapy or to have a healthier response to the loss of his  daughter and less sadness as evidenced by patient report in therapy  notes as well as to help him feel less overwhelmed by her loss.  Goals are to see a 50% reduction in grief symptoms over the next 6 months.  I will encourage the use of the patient telling his grief story about his daughter including encouraging him to bring pictures and memorabilia to help process his feelings including any feelings of guilt associated with her loss.  We will use cognitive behavioral therapy to help identify and change  anxiety producing thoughts and behavior patterns so as to improve his ability to better manage anxiety and stress, and manage thoughts and worrisome thinking contributing to anxiety.  We will also refresh and encourage dialectical behavior therapy distress tolerance and mindfulness skills with the intention of reducing anxiety by 50% over  the next 6 months. Progress: 30% Interventions: Cognitive Behavioral Therapy  Diagnosis:PTSD,  Bi-Polar D/O  Plan: F/U/ appointments scheduled weekly.  Lorrene CHRISTELLA Hasten, [facility]                                                                                                                   Lorrene CHRISTELLA Hasten, [facility]               Lorrene CHRISTELLA Hasten, [facility]               Lorrene CHRISTELLA Hasten, [facility]                Lorrene CHRISTELLA Hasten, [facility]               Lorrene CHRISTELLA Hasten, [facility]               Lorrene CHRISTELLA Hasten, [facility]               Lorrene CHRISTELLA Hasten, [facility]               Lorrene CHRISTELLA Hasten, [facility]               Lorrene CHRISTELLA Hasten, [facility]               Lorrene CHRISTELLA Hasten, [facility]  [place] [facility] Counselor/Therapist Progress Note  Patient ID: Shooter Tangen, MRN: [id number],    Date: 04/01/2024 This session was held via video teletherapy. The patient consented to the vide o teletherapy and was located in his home during this session. He is aware it is the responsibility of the patient to secure confidentiality on his end of the session. The provider was in a private home office for the duration of this session.   The patient arrived on time for her Caregility session  Time Spent: 59 minutes, 2 PM to 2:59 PM  Treatment Type: Individual Therapy Reported Symptoms: anxiety, panic attacks The patient  said he jokingly said to his wife that he had not had any pain issues in the cervical area and then a few days ago he started having pain in his neck and shoulders.  He now sits  related to degenerative disk and has some consistent pain somewhere in his spine with that but jokingly says it just depends on Wednesday which part of his body is decides to show up and hurt.  He is doing the best that he can.  He is still walking which he kno ws is good for him but does not have right now the strength to do as much in the morning as he has done in the past so he is denying that to 3 times a day for consistency.  He is still doing some positive things and that he is staying in touch with his brother and other family members.  He is reconnecting with friends from high school and he is which she is enjoying.  One of them has had a similar experience in that she lost her son in  the same way the patient lost his daughter.  He said they did not think and Easter in which they set a place for his daughter at the table and put her picture there.  He initially had reservations about doing that he and his wife had made everyone feel as if she was really there with him.  He still feels that he is grieving in a healthy way.  He and his wife have always taken a trip around both of their birthdays which are 2 days apart.   That is now close to his daughter died so they have started taking some of her rashes with him and sprinkling them where they go as a way of remembrance.  They are deciding what that looks like this year in July.  He does contract for safety having no thoughts of hurting himself or anyone else. We will continue to work on coping skills for helping with his anxiety and the progression of his Lewy body dementia diagnosis as well as p rocessing his grief. Mental Status Exam: Appearance:  Well Groomed  Behavior: Appropriate Motor: Pt. Reports some instability due to Lewey Body dementia Speech/Language:  Normal Rate Affect: Appropriate Mood: normal Thought process: normal Thought content:  WNL Sensory/Perceptual disturbances:  WNL Orientation: oriented to person, place, and  situation Attention: Good Concentration: Good Memory: Impaired short term  Fund of knowledge:  Good Insight:  Good Judgment:  Good Impulse Control: Good  Risk Assessment: Danger to Self: No Self-injurious Behavior: No Danger to Others: No Duty to Warn:no Physical Aggression / Violence:No  Access to Firearms a concern: No  Gang Involvement:No  Treatment plan: Will employ cognitive behavioral therapy as well as grief and loss therapy for relief of anxiety and grief.  Goals of grief therapy  or t o have a healthier response to the loss of his daughter and less sadness as evidenced by patient report in therapy notes as well as to help him feel less overwhelmed by her loss.  Goals are to see a 50% reduction in grief symptoms over the next 6 months.  I will encourage the use of the patient telling his grief story about his daughter including encouraging him to bring pictures and memorabilia to help process his feelings including an y feelings of guilt associated with her loss.  We will use cognitive behavioral therapy to help identify and change anxiety producing thoughts and behavior patterns so as to improve his ability to better manage anxiety and stress, and manage thoughts and worrisome thinking contributing to anxiety.  We will also refresh and encourage dialectical behavior therapy distress tolerance and mindfulness skills with the intention of reducing anx iety by 50% over the next 6 months. Progress: 30% Interventions: Cognitive Behavioral Therapy  Diagnosis:PTSD, Bi-Polar D/O  Plan: F/U/ appointments scheduled weekly.  Lorrene CHRISTELLA Hasten, [facility]                                                                                                                   Lorrene CHRISTELLA Coy s,  [facility]  [place] [facility] Counselor/Therapist Progress Note  Patient ID: Suvan Stcyr, MRN: [id number],    Date: 04/01/2024 This session was held via video teletherapy. The patient consented to the video teletherapy and was located in his home during this session. He is aware it is the responsibility of the patient to secure confidentiality on his end of the session. The provider was in a private home office for the duration of this session.   The patient arrived on time for her Caregility session  Time Spent: 58 minutes, 2  PM to 2:58 PM  Treatment Type: Individual Therapy Reported Symptoms: anxiety, panic attacks A fairly difficult week because it was his daughter's birthday.  He said this year was more  difficult than last year but he could not pinpoint why.  They did remember her about going out to a nice restaurant which is something her daughter always wanted to do.  He said they were able to laugh and joke about funny things that his daughter had s aid or done which is not something they were able to do this time last year so he saw that as healthy grieving.  He knows that the time change threw things off a little bit for everybody and his wife still is not feeling well and his back has been bothering him.  He was not able to go to his first round of physical therapy this week because his back was bothering him more.  He has been thinking a lot more about his daughter which he fee ls comfortable with.  He is thankful that it is getting warmer and staying light longer so he can get back to some of the coping skills such as fishing that he has done in the past.   We will continue to work on coping skills for helping with his anxiety and the progression of his Lewy body dementia diagnosis as well as processing his grief. Mental Status Exam: Appearance:  Well Groomed  Behavior: Appropriate Motor: Pt. Repor ts some instability due to Lewey Body dementia Speech/Language:  Normal Rate Affect: Appropriate Mood: normal Thought process: normal Thought content:  WNL Sensory/Perceptual disturbances:  WNL Orientation: oriented to person, place, and situation Attention: Good Concentration: Good Memory: Impaired short term Fund of knowledge:  Good Insight:  Good Judgment:  Good Impulse Control: Good  Risk Assessment: Danger to  Self: No Self-injurious Behavior: No Danger to Others: No Duty to Warn:no Physical Aggression / Violence:No  Access to Firearms a concern: No  Gang Involvement:No  Treatment plan: Will employ cognitive behavioral therapy as well as grief and loss therapy for relief of anxiety and grief.  Goals of grief therapy or to have a healthier response to the loss of his  daughter and less sadness as evidenced by patient report in therapy  notes as well as to help him feel less overwhelmed by her loss.  Goals are to see a 50% reduction in grief symptoms over the next 6 months.  I will encourage the use of the patient telling his grief story about his daughter including encouraging him to bring pictures and memorabilia to help process his feelings including any feelings of guilt associated with her loss.  We will use cognitive behavioral therapy to help identify and chang e anxiety producing thoughts and behavior patterns so as to improve his ability to better manage anxiety and stress, and manage thoughts and worrisome thinking contributing to anxiety.  We will also refresh and encourage dialectical behavior therapy  distress tolerance and mindfulness skills with the intention of reducing anxiety by 50% over the next 6 months. Progress: 30% Interventions: Cognitive Behavioral Therapy  Diagnosis:PTSD,  Bi-Polar D/O  Plan: F/U/ appointments scheduled weekly.  Lorrene CHRISTELLA Hasten, [facility]                                                                                                                   Lorrene CHRISTELLA Hasten, [facility]               Lorrene CHRISTELLA Hasten, [facility]               Lorrene CHRISTELLA Hasten, [facility]  [place] [facility] Counselor/Therapist Progress Note  Patient ID: Gurtej Noyola, MRN: [id number],    Date: 04/01/2024 This s ession was held via video teletherapy. The patient consented to the video teletherapy and was located in his home during this session. He is aware it is the responsibility of the patient to secure confidentiality on his end of the session. The provider was in a private home office for the duration of this session.   The patient arrived on time for her Caregility session  Time Spent: 1:05 PM until 2 PM, 55 minutes Treatment Type:  Individual Therapy Reported Symptoms: anxiety, panic attacks The patient said he came in from his walk this morning and saw what he thought was some sort of tic from his wife.  It was not the first time it happened and he wonders if it might be tardive dyskinesia because of some new medications that she is on.  She has a call into the doctor right now and hopes to hear something back soon.  They have narrowed down to the fact they thi nk most of her discomfort in breathing is coming from the diaphragm and they are looking for alternatives for treatment.  The patient continues to do those things which help him cope  including walking, fishing when he can, spending time with his son and daughter-in-law and grandson.  He also has a way of continued coping with grief he is writing letters to his daughters as a way of expressing his feelings and says that is very benefic ial.  He appears to be grieving in a healthy way but he knows that he to year anniversary of her death is coming up and there is some grief there but says it is not overwhelming.  We will continue to work on coping skills for helping with his anxiety and the progression of his Lewy body dementia diagnosis as well as processing his grief. Mental Status Exam: Appearance:  Well Groomed  Behavior: Appropriate Motor: Pt. Reports so me instability due to Lewey Body dementia Speech/Language:  Normal Rate Affect: Appropriate Mood: normal Thought process: normal Thought content:  WNL Sensory/Perceptual disturbances:  WNL Orientation: oriented to person, place, and situation Attention: Good Concentration: Good Memory: Impaired short term Fund of knowledge:  Good Insight:  Good Judgment:  Good Impulse Control: Good  Risk Assessment: Danger to Self : No Self-injurious Behavior: No Danger to Others: No Duty to Warn:no Physical Aggression / Violence:No  Access to Firearms a concern: No  Gang Involvement:No  Treatment plan: Will employ cognitive behavioral therapy as well as grief and loss therapy for relief of anxiety and grief.  Goals of grief therapy or to have a healthier response to the loss of his daughter and less sadness as evidenced by patient report in therapy note s as well as to help him feel less overwhelmed by her loss.  Goals are to see a 50% reduction in grief symptoms over the next 6 months.  I will encourage the use of the patient telling his grief story about his daughter including encouraging him to bring pictures and memorabilia to help process his feelings including any feelings of guilt associated with her loss.  We  will use cognitive behavioral therapy to help identify and change anx iety producing thoughts and behavior patterns so as to improve his ability to better manage anxiety and stress, and manage thoughts and worrisome thinking contributing to anxiety.  We will also refresh and encourage dialectical behavior therapy distress tolerance  and mindfulness skills with the intention of reducing anxiety by 50% over the next 6 months. Progress: 30% Interventions: Cognitive Behavioral Therapy  Diagnosis:PTSD, Bi-P olar D/O  Plan: F/U/ appointments scheduled weekly.  Lorrene CHRISTELLA Hasten, [facility]                                                                                                                   Lorrene CHRISTELLA Hasten, [facility]  [place] [facility] Counselor/Therapist Progress Note  Patient ID: Jabori Henegar, MRN: [id number],    Date: 04/01/2024 This session was held via video teletherapy. The patient consented to the video teletherapy  and was located in his home during this session. He is aware it is the responsibility of the patient to secure confidentiality on his end of the session. The provider was in a private home office for the duration of this session.   The patient arrived on time for her Caregility session  Time Spent: 59 minutes, 2 PM to 2:59 PM  Treatment Type: Individual Therapy Reported Symptoms: anxiety, panic attacks The patient continues to  present with physical pain and both his back and his knee.  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  He has continued to walk because he knows that is good for him in various ways but says it has been painful.  His wife also was not feeling wel l but she is going back to the doctor next week to have an ablation and hopes that will bring her some relief.  For the most part he has been at the last couple weeks reaching back out and hearing from some old friends as well as time with his son and son's family.  He says that his wife's cousin is coming down for a weekend soon and they enjoy time with her and that he will go to his brother's house sometime in the next few weeks.  He  recognizes the need for people that he cares about in helping with his depression. We will continue to work on coping skills for helping with his anxiety and the progression of his  Lewy body dementia diagnosis as well as processing his grief. Mental Status Exam: Appearance:  Well Groomed  Behavior: Appropriate Motor: Pt. Reports some instability due to Lewey Body dementia Speech/Language:  Normal Rate Affect: Appropriate Moo d: normal Thought process: normal Thought content:  WNL Sensory/Perceptual disturbances:  WNL Orientation: oriented to person, place, and situation Attention: Good Concentration: Good Memory: Impaired short term Fund of knowledge:  Good Insight:  Good Judgment:  Good Impulse Control: Good  Risk Assessment: Danger to Self: No Self-injurious Behavior: No Danger to Others: No Duty to Warn:no Physical Aggression /  Violence:No  Access to Firearms a concern: No  Gang Involvement:No  Treatment plan: Will employ cognitive behavioral therapy as well as grief and loss therapy for relief of anxiety and grief.  Goals of grief therapy or to have a healthier response to the loss of his daughter and less sadness as evidenced by patient report in therapy notes as well as to help him feel less overwhelmed by her loss.  Goals are to see a 50% reduction in g rief symptoms over the next 6 months.  I will encourage the use of the patient telling his grief story about his daughter including encouraging him to bring pictures and memorabilia to help process his feelings including any feelings of guilt associated with her loss.  We will use cognitive behavioral therapy to help identify and change anxiety producing thoughts and behavior patterns so as to improve his ability to better manage anxiet y and stress, and manage thoughts and worrisome thinking contributing to anxiety.  We will also  refresh and encourage dialectical behavior therapy distress tolerance and mindfulness skills with the intention of reducing anxiety by 50% over the next 6 months. Progress: 30% Interventions: Cognitive Behavioral Therapy  Diagnosis:PTSD, Bi-Polar D/O  Plan: F/U/  appointments scheduled weekly.  Lorrene CHRISTELLA Hasten, [facility]                                                                                                                    Lorrene CHRISTELLA Hasten, [facility]               Lorrene CHRISTELLA Hasten, [facility]               Lorrene CHRISTELLA Hasten, [facility]               Lorrene CHRISTELLA Hasten, [facility]  [place] [facility] Counselor/Therapist Progress Note  Patient ID: Tia Gelb, MRN: [id number],    Date: 04/01/2024 This session was held via video teletherapy. T he patient consented to the video teletherapy and was located in his home during this session. He is aware it  is the responsibility of the patient to secure confidentiality on his end of the session. The provider was in a private home office for the duration of this session.   The patient arrived on time for her Caregility session  Time Spent: 57 minutes, 2 PM to 2:57 PM  Treatment Type: Individual Therapy Reported Symptoms: anx iety, panic attacks The patient is still having significant back pain.  He cannot sit anywhere for very long and has to be careful where he sits.  There is still some unsteadiness although he did not report any falls over the past 2 weeks.  His biggest concern now is for his wife.  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  She is meeting with her cardiol ogist again next week to see what possible treatment is for her.  He still thinks there may be some issue with her diaphragm but she is meeting with the pulmonologist also.  He is doing what he can to help her.  His daughter's birthday is March 11 so he knows that is a difficult time for both he and his wife next week.  Typically they do something to remember her but because of the wife's current health issues [place] go to [place] as they had planned so they are talking about what to do to remember and celebrate her birthday.  We will continue to work on coping skills for helping with his anxiety and the progression of his Lewy body dementia diagnosis as well as processing his grief. Mental Status Exam: Appearance:  Well Groomed  Behavior: Appropriate Motor: Pt. Reports some instability due to Lewey Body dementia Speech/Language:  Normal Rate  Affect: Appropriate Mood: normal Thought process: normal Thought content:  WNL Sensory/Perceptual disturbances:  WNL Orientation: oriented to person, place, and situation Attention: Good Concentration: Good Memory: Impaired short term Fund of knowledge:  Good Insight:   Good Judgment:  Good Impulse Control: Good  Risk Assessment: Danger to Self: No Self-injurious Behavior: No Danger to Others: No Duty to Warn:no  Physical Aggression / Violence:No  Access to Firearms a concern: No  Gang Involvement:No  Treatment plan: Will employ cognitive behavioral therapy as well as grief and loss therapy for relief of anxiety and grief.  Goals of grief therapy or to have a healthier response to the loss of his daughter and less sadness as evidenced by patient report in therapy notes as well as to help him feel less overwhelmed by her loss.  Goals are to  see a 50% reduction in grief symptoms over the next 6 months.  I will encourage the use of the patient telling his grief story about his daughter including encouraging him to bring pictures and memorabilia to help process his feelings including any feelings of guilt associated with her loss.  We will use cognitive behavioral therapy to help identify and change anxiety producing thoughts and behavior patterns so as to improve his ability  to better manage anxiety and stress, and manage thoughts and worrisome thinking contributing to anxiety.  We will also refresh and encourage dialectical behavior therapy distress tolerance and mindfulness  skills with the intention of reducing anxiety by 50% over the next 6 months. Progress: 30% Interventions: Cognitive Behavioral Therapy  Diagnosis:PTSD, Bi-Polar D/O  Plan: F/U/ appointments scheduled weekly.  Lorrene CHRISTELLA Hasten, [facility]                                                                                                                   Lorrene CHRISTELLA Hasten, [facility]  [place] [facility] Counselor/Therapist Progress Note  Patient ID: Cristino Degroff, MRN: [id number],    Date: 04/01/2024 This session was held via video teletherapy. The patient consented to the video teletherapy and was located in his home during this session. He is aware it is the re sponsibility of the patient to secure confidentiality on his end of the session. The provider was in a private home office for the duration of this session.   The patient arrived on time for her Caregility session  Time Spent: 58 minutes, 2 PM to 2:58 PM  Treatment Type: Individual Therapy Reported Symptoms: anxiety, panic attacks A fairly difficult week because it was his daughter's birthday.  He said this year was more diffic ult than last year but he could not pinpoint why.  They did remember her about going out to a nice restaurant which is something her daughter always wanted to do.  He said they were  able to laugh and joke about funny things that his daughter had said or done which is not something they were able to do this time last year so he saw that as healthy grieving.  He knows that the time change threw things off a little bit for everybody and hi s wife still is not feeling well and his back has been bothering him.  He was not able to go to his first round of physical therapy this week because his back was bothering him more.  He has been thinking a lot more about his daughter which he feels comfortable with.  He is thankful that it is getting warmer and staying light longer so he can get back to some of the coping skills such as fishing that he has done in the past.   We wil l continue to work on coping skills for helping with his anxiety and the progression of his Lewy body dementia diagnosis as well as processing his grief. Mental Status Exam: Appearance:  Well Groomed  Behavior: Appropriate Motor: Pt. Reports some instability due to Lewey Body dementia Speech/Language:  Normal Rate Affect: Appropriate Mood: normal Thought process: normal Thought content:  WNL Sensory/Perceptual disturbanc es:  WNL Orientation: oriented to person, place, and situation Attention: Good Concentration: Good Memory: Impaired short term Fund of knowledge:  Good Insight:  Good Judgment:  Good Impulse Control: Good  Risk Assessment: Danger to Self: No Self-injurious Behavior: No Danger to Others: No Duty to Warn:no Physical Aggression / Violence:No  Access to Firearms a concern: No  Gang Involvement:No  Treatment plan: Wil l employ cognitive behavioral therapy as well as grief and loss therapy for relief of anxiety and grief.  Goals of grief therapy or to have a healthier response to the loss of his daughter and less sadness as evidenced by patient report in therapy notes as well as to help him feel less overwhelmed by her loss.  Goals are to see a 50% reduction in grief symptoms  over the next 6 months.  I will encourage the use of the patient telling his  grief story about his daughter including encouraging him to bring pictures and memorabilia to help process his feelings including any feelings of guilt associated with her loss.  We will use cognitive behavioral therapy to help identify and change anxiety producing thoughts and behavior patterns so as to improve his ability to better manage anxiety and stress, and manage thoughts and worrisome thinking contributing to anxiety.  We will  also refresh and encourage dialectical behavior therapy  distress tolerance and mindfulness skills with the intention of reducing anxiety by 50% over the next 6 months. Progress: 30% Interventions: Cognitive Behavioral Therapy  Diagnosis:PTSD, Bi-Polar D/O  Plan: F/U/ appointments scheduled weekly.  Lorrene CHRISTELLA Hasten, [facility]                                                                                                                    Lorrene CHRISTELLA Hasten, [facility]               Lorrene CHRISTELLA Hasten, [facility]               Lorrene CHRISTELLA Hasten, [facility]  [place] [facility] Counselor/Therapist Progress Note  Patient ID: Glenroy Crossen, MRN: [id number],    Date: 04/01/2024 This session was held via video teletherapy. The patient consented to the video teletherapy and was located in his home during this session. He is aware it is the responsibility of the pa tient to secure confidentiality on his end of the session. The provider was in a private home office for the duration of this session.   The patient arrived on time for her Caregility session  Time Spent: 57 minutes, 2 PM to 2:57 PM  Treatment Type: Individual Therapy Reported Symptoms: anxiety, panic attacks The patient is still having significant back pain.  He cannot sit anywhere for very long and has to be careful where he  sits.  There is still some unsteadiness although he did not report any falls over the past 2 weeks.  His biggest concern now is for his wife.  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  She is meeting with her cardiologist again next week to see what possible treatment is for her.  He still thinks there may be some issue with her diaphragm but she is meeti ng with the pulmonologist also.  He is doing what he can to help her.  His daughter's birthday is March 11 so he knows that is a difficult time for both he and his wife next week.   Typically they do something to remember her but because of the wife's current health issues they cannot go to [place] as they had planned so they are talking about what to do to remember and celebrate her birthday.  We will continue to work on copin g skills for helping with his anxiety and the progression of his Lewy body dementia diagnosis as well as processing his grief. Mental Status Exam: Appearance:  Well Groomed  Behavior: Appropriate Motor: Pt. Reports some instability due to Lewey Body dementia Speech/Language:  Normal Rate Affect: Appropriate Mood: normal Thought process: normal Thought content:  WNL Sensory/Perceptual disturbances:  WNL Orientation: or iented to person, place, and situation Attention: Good Concentration: Good Memory: Impaired short term Fund of knowledge:  Good Insight:  Good Judgment:  Good Impulse Control: Good  Risk Assessment: Danger to Self: No Self-injurious Behavior: No Danger to Others: No Duty to Warn:no Physical Aggression / Violence:No  Access to Firearms a concern: No  Gang Involvement:No  Treatment plan: Will employ cognitive behavior al therapy as well as grief and loss therapy for relief of anxiety and grief.  Goals of grief therapy or to have a healthier response to the loss of his daughter and less sadness as evidenced by patient report in therapy notes as well as to help him feel less overwhelmed by her loss.  Goals are to see a 50% reduction in grief symptoms over the next 6 months.  I will encourage the use of the patient telling his grief story about his daug hter including encouraging him to bring pictures and memorabilia to help process his feelings including any feelings of guilt associated with her loss.  We will use cognitive behavioral therapy to help identify and change anxiety producing thoughts and behavior patterns so as to improve his ability to better manage anxiety and stress, and manage thoughts and  worrisome thinking contributing to anxiety.  We will also refresh and encourage  dialectical behavior therapy distress tolerance and mindfulness  skills with the intention of reducing anxiety by 50% over the next 6 months. Progress: 30% Interventions: Cognitive Behavioral Therapy  Diagnosis:PTSD, Bi-Polar D/O  Plan: F/U/ appointments scheduled weekly.  Lorrene CHRISTELLA Hasten, [facility]                                                                                                                    Lorrene CHRISTELLA Hasten, [facility]  [place] [facility] Counselor/Therapist Progress Note  Patient ID: Evyn Putzier, MRN:  [id number],    Date: 04/01/2024 This session was held via video teletherapy. The patient consented to the video teletherapy and was located in his home during this session. He is aware it is the responsibility of the patient to secure confidentiality on his end of the session. The provider was in a private home office for the duration o f this session.   The patient arrived on time for her Caregility session  Time Spent: 57 minutes, 2 PM to 2:57 PM  Treatment Type: Individual Therapy Reported Symptoms: anxiety, panic attacks  We will continue to work on coping skills for helping with his anxiety and the progression of his Lewy body dementia diagnosis as well as processing his grief. Mental Status Exam: Appearance:  Well Groomed  Behavior: Appropriate  Motor: Pt. Reports some instability due to Lewey Body dementia Speech/Language:  Normal Rate Affect: Appropriate Mood: normal Thought process: normal Thought content:  WNL Sensory/Perceptual disturbances:  WNL Orientation: oriented to person, place, and situation Attention: Good Concentration: Good Memory: Impaired short term Fund of knowledge:  Good Insight:  Good Judgment:  Good Impulse Control: Good  Risk Assess ment: Danger to Self: No Self-injurious Behavior: No Danger to Others: No Duty to Warn:no Physical Aggression / Violence:No  Access to Firearms a concern: No  Gang Involvement:No  Treatment plan: Will employ cognitive behavioral therapy as well as grief and loss therapy for relief of anxiety and grief.  Goals of grief therapy or to have a healthier response to the loss of his daughter and less sadness as evidenced by patient r eport in therapy notes as well as to help him feel less overwhelmed by her loss.  Goals are to see a 50% reduction in grief symptoms over the next 6 months.  I will encourage the use of the patient telling his grief story about his daughter including encouraging him to bring pictures  and memorabilia to help process his feelings including any feelings of guilt associated with her loss.  We will use cognitive behavioral therapy to help id entify and change anxiety producing thoughts and behavior patterns so as to improve his ability to better manage anxiety and stress, and manage thoughts and worrisome thinking contributing to anxiety.  We will also refresh and encourage dialectical behavior therapy distress tolerance and mindfulness skills with the intention of reducing anxiety by 50% over the next 6 months. Progress: 30% Interventions: Cognitive Behavioral Therapy   Diagnosis:PTSD, Bi-Polar D/O  Plan: F/U/ appointments scheduled weekly.  Lorrene CHRISTELLA Hasten, [facility]                                                                                                                   Lorrene CHRISTELLA Hasten, [facility]               Lorrene CHRISTELLA Hasten, [facility]               Lorrene CHRISTELLA SHAUNNA dyana, [facility]  Lorrene CHRISTELLA Hasten, [facility]               Lorrene CHRISTELLA Hasten, [facility]               Lorrene CHRISTELLA Hasten, [facility]               Lorrene CHRISTELLA Hasten, [facility]  [place] [facility] Counselor/Therapist Progress Note  Patient ID: Makoa Satz, MRN: [id number],    Date: 04/01/2024 This session was held via video teletherapy. The patient consented to the video teletherapy and was located in his home during this session. He is aware it is the responsibility of the patient to secure confidentia lity on his end of the session. The provider was in a private home office for the duration of this session.   The patient arrived on time for her Caregility session  Time Spent: 39 minutes, 2 PM to 2:39 PM  Treatment Type: Individual Therapy Reported Symptoms: anxiety, panic attacks The patient was not feeling well today.  He started feeling really earlier in the week having a fever bad headaches body aches.  He felt a little b etter yesterday but feels a little bit of a resurgence in headaches and body aches and is running a low-grade fever.  His wife is feeling some of the same symptoms.  For the most part he says he is doing well.  They did increase his memory medication because they were starting to see some increasing decline in short-term memory and he said until recently his long-term memory had been good but he was starting to see a difference in how h e used to markers to remember what happened at certain times historically.  He feels the medication has helped and has brightened his mood a little also.  He is still trying to walk  every day and fish when he can and has enjoyed watching the [employer] as a good distraction.  He reports that he feels he is managing mood fairly well.  He has an appointment in 2 weeks.  He does contract for safety having no thoughts of hurt ing himself or anyone else. We will continue to work on coping skills for helping with his anxiety and the progression of his Lewy body dementia diagnosis as well as processing his grief. Mental Status Exam: Appearance:  Well Groomed  Behavior: Appropriate Motor: Pt. Reports some instability due to Lewey Body dementia Speech/Language:  Normal Rate Affect: Appropriate Mood: normal Thought process: normal Thought content:   WNL Sensory/Perceptual disturbances:  WNL Orientation: oriented to person, place, and situation Attention: Good Concentration: Good Memory: Impaired short term Fund of knowledge:  Good Insight:  Good Judgment:  Good Impulse Control: Good  Risk Assessment: Danger to Self: No Self-injurious Behavior: No Danger to Others: No Duty to Warn:no Physical Aggression / Violence:No  Access to Firearms a concern: No  Gang I nvolvement:No  Treatment plan: Will employ cognitive behavioral therapy as well as grief and loss therapy for relief of anxiety and grief.  Goals of grief therapy or to have a healthier response to the loss of his daughter and less sadness as evidenced by patient report in therapy notes as well as to help him feel less overwhelmed by her loss.  Goals are to see a 50% reduction in grief symptoms over the next 6 months.  I will encourage  the use of the patient telling his grief story about his daughter including encouraging him to bring pictures and memorabilia to help process his feelings including any feelings of guilt associated with her loss.  We will use cognitive behavioral therapy to help identify and change anxiety producing thoughts and behavior patterns so as to improve his ability to  better manage anxiety and stress, and manage thoughts and worrisome thinkin g contributing to anxiety.  We will also refresh  and encourage dialectical behavior therapy distress tolerance and mindfulness skills with the intention of reducing anxiety by 50% over the next 6 months. Progress: 30% Interventions: Cognitive Behavioral Therapy  Diagnosis:PTSD, Bi-Polar D/O  Plan: F/U/ appointments scheduled weekly.  Lorrene CHRISTELLA Hasten, [facility]                                                                                                                    Lorrene CHRISTELLA Hasten, [facility]  [place] [facility] Counselor/Therapist  Progress Note  Patient ID: Omarr Hann, MRN: [id number],    Date: 04/01/2024 This session was held via video teletherapy. The patient consented to the video teletherapy and was located in his home during this session. He is aware it is the responsibility of the patient to secure confidentiality on his end of the session . The provider was in a private home office for the duration of this session.   The patient arrived on time for her Caregility session  Time Spent: 58 minutes, 2 PM to 2:58 PM  Treatment Type: Individual Therapy Reported Symptoms: anxiety, panic attacks A fairly difficult week because it was his daughter's birthday.  He said this year was more difficult than last year but he could not pinpoint why.  They did remember her about  going out to a nice restaurant which is something her daughter always wanted to do.  He said they were able to laugh and joke about funny things that his daughter had said or done which is not something they were able to do this time last year so he saw that as healthy grieving.  He knows that the time change threw things off a little bit for everybody and his wife still is not feeling well and his back has been bothering him.  He was n ot able to go to his first round of physical therapy this week because his back was bothering him more.  He has been thinking a lot more about his daughter which he feels comfortable with.  He is thankful that it is getting warmer and staying light longer so he can get back to some of the coping skills such as fishing that he has done in the past.   We will continue to work on coping skills for helping with his anxiety and the progre ssion of his Lewy body dementia diagnosis as well as processing his grief. Mental Status Exam: Appearance:  Well Groomed  Behavior: Appropriate Motor: Pt. Reports some instability due to Lewey Body dementia Speech/Language:  Normal Rate Affect: Appropriate Mood: normal Thought  process: normal Thought content:  WNL Sensory/Perceptual disturbances:  WNL Orientation: oriented to person, place, and situation Attention: G ood Concentration: Good Memory: Impaired short term Fund of knowledge:  Good Insight:  Good Judgment:  Good Impulse Control: Good  Risk Assessment: Danger to Self: No Self-injurious Behavior: No Danger to Others: No Duty to Warn:no Physical Aggression / Violence:No  Access to Firearms a concern: No  Gang Involvement:No  Treatment plan: Will employ cognitive behavioral therapy as well as grief and loss therapy for rel ief of anxiety and grief.  Goals of grief therapy or to have a healthier response to the loss of his daughter and less sadness as evidenced by patient report in therapy notes as well as to help him feel less overwhelmed by her loss.  Goals are to see a 50% reduction in grief symptoms over the next 6 months.  I will encourage the use of the patient telling his grief story about his daughter including encouraging him to bring pictures and  memorabilia to help process his feelings including any feelings of guilt associated with her loss.  We will use cognitive behavioral therapy to help identify and change anxiety producing thoughts and behavior patterns so as to improve his ability to better manage anxiety and stress, and manage thoughts and worrisome thinking contributing to anxiety.  We will also refresh and encourage dialectical behavior therapy distress  tolerance and  mindfulness skills with the intention of reducing anxiety by 50% over the next 6 months. Progress: 30% Interventions: Cognitive Behavioral Therapy  Diagnosis:PTSD, Bi-Polar D/O  Plan: F/U/ appointments scheduled weekly.  Lorrene CHRISTELLA Hasten, [facility]                                                                                                                     Lorrene CHRISTELLA Hasten, [facility]               Lorrene CHRISTELLA Hasten, [facility]               Lorrene CHRISTELLA Hasten, [facility]  [place] [facility] Counselor/Therapist Progress Note  Patient ID: Veto Macqueen, MRN: [id number],    Date: 04/01/2024 This session was held via video teletherapy. The patient consented to the video teletherapy and was located in his home during this session. He is aware it is the responsibility of the patient to secure confidentiality on his end of the session. The provider was in a private ho me office for the duration of this session.   The patient arrived on time for her Caregility session  Time Spent: 1:05 PM until 2 PM, 55 minutes Treatment Type: Individual  Therapy Reported Symptoms: anxiety, panic attacks The patient said he came in from his walk this morning and saw what he thought was some sort of tic from his wife.  It was not the first time it happened and he wonders if it might be tardive dyskinesia becaus e of some new medications that she is on.  She has a call into the doctor right now and hopes to hear something back soon.  They have narrowed down to the fact they think most of her discomfort in breathing is coming from the diaphragm and they are looking for alternatives for treatment.  The patient continues to do those things which help him cope including walking, fishing when he can, spending time with his son and daughter-in-law  and grandson.  He also has a way of continued coping with grief he is writing letters to his daughters as a way of expressing his feelings and says that is very beneficial.  He appears to be grieving in a healthy way but he knows that he to year anniversary of her death is coming up and there is some grief there but says it is not overwhelming.  We will continue to work on coping skills for helping with his anxiety and the progression  of his Lewy body dementia diagnosis as well as processing his grief. Mental Status Exam: Appearance:  Well Groomed  Behavior: Appropriate Motor: Pt. Reports some instability due to Lewey Body dementia Speech/Language:  Normal Rate Affect: Appropriate Mood: normal Thought process: normal Thought content:  WNL Sensory/Perceptual disturbances:  WNL Orientation: oriented to person, place, and situation Attention: Good  Concentration: Good Memory: Impaired short term Fund of knowledge:  Good Insight:  Good Judgment:  Good Impulse Control: Good  Risk Assessment: Danger to Self: No Self-injurious Behavior: No Danger to Others: No Duty to Warn:no Physical Aggression / Violence:No  Access to Firearms a concern: No  Gang Involvement:No  Treatment plan: Will employ  cognitive behavioral therapy as well as grief and loss therapy for relief o f anxiety and grief.  Goals of grief therapy or to have a healthier response to the loss of his daughter and less sadness as evidenced by patient report in therapy notes as well as to help him feel less overwhelmed by her loss.  Goals are to see a 50% reduction in grief symptoms over the next 6 months.  I will encourage the use of the patient telling his grief story about his daughter including encouraging him to bring pictures and memo rabilia to help process his feelings including any feelings of guilt associated with her loss.  We will use cognitive behavioral therapy to help identify and change anxiety producing thoughts and behavior patterns so as to improve his ability to better manage anxiety and stress, and manage thoughts and worrisome thinking contributing to anxiety.  We will also refresh and encourage dialectical behavior therapy distress tolerance and  mind fulness skills with the intention of reducing anxiety by 50% over the next 6 months. Progress: 30% Interventions: Cognitive Behavioral Therapy  Diagnosis:PTSD, Bi-Polar D/O  Plan: F/U/ appointments scheduled weekly.  Lorrene CHRISTELLA Hasten, [facility]                                                                                                                    Lorrene CHRISTELLA Hasten, [facility]  [place] [facility] Counselor/Therapist Progress  Note  Patient ID: Alder Murri, MRN: [id number],    Date: 04/01/2024 This session was held via video teletherapy. The patient consented to the video teletherapy and was located in his home during this session. He is aware it is the responsibility of the patient to secure confidentiality on his end of the session. The provider was in a private home office for the duration of this session.   The patient arrived on time for her  Caregility session  Time Spent: 59 minutes, 2 PM to 2:59 PM  Treatment Type: Individual Therapy Reported Symptoms: anxiety, panic attacks The patient continues to present with physical pain and both his back and his knee.  He started with a new physical therapist and told him that there was a lot of arthritis in both knees and or certain things he cannot do but they had him do that anyway and now for the past 3 days he has been in  significant knee pain.  He has continued to walk because he knows that is good for him in various ways but says it has been painful.  His wife also was not feeling well but she is  going back to the doctor next week to have an ablation and hopes that will bring her some relief.  For the most part he has been at the last couple weeks reaching back out and hearing from some old friends as well as time with his son and son's family.  He sa ys that his wife's cousin is coming down for a weekend soon and they enjoy time with her and that he will go to his brother's house sometime in the next few weeks.  He recognizes the need for people that he cares about in helping with his depression. We will continue to work on coping skills for helping with his anxiety and the progression of his Lewy body dementia diagnosis as well as processing his grief. Mental Status Exam: Appear ance:  Well Groomed  Behavior: Appropriate Motor: Pt. Reports some instability due to Lewey Body dementia Speech/Language:  Normal Rate Affect: Appropriate Mood: normal Thought process: normal Thought content:  WNL Sensory/Perceptual disturbances:  WNL Orientation: oriented to person, place, and situation Attention: Good Concentration: Good Memory: Impaired short term Fund of knowledge:  Good Insight:  Good Judg ment:  Good Impulse Control: Good  Risk Assessment: Danger to Self: No Self-injurious Behavior: No Danger to Others: No Duty to Warn:no Physical Aggression / Violence:No  Access to Firearms a concern: No  Gang Involvement:No  Treatment plan: Will employ cognitive behavioral therapy as well as grief and loss therapy for relief of anxiety and grief.  Goals of grief therapy or to have a healthier response to the loss of his da ughter and less sadness as evidenced by patient report in therapy notes as well as to help him feel less overwhelmed by her loss.  Goals are to see a 50% reduction in grief symptoms over the next 6 months.  I will encourage the use of the patient telling his grief story about his daughter including encouraging him to bring pictures and memorabilia to help process  his feelings including any feelings of guilt associated with her loss.  We  will use cognitive behavioral therapy to help identify and change anxiety producing thoughts and behavior patterns so as to improve his ability to better manage anxiety and stress, and manage thoughts and worrisome thinking contributing to anxiety.  We will  also refresh and encourage dialectical behavior therapy distress tolerance and mindfulness skills with the intention of reducing anxiety by 50% over the next 6 months. Progress: 30 % Interventions: Cognitive Behavioral Therapy  Diagnosis:PTSD, Bi-Polar D/O  Plan: F/U/ appointments scheduled weekly.  Lorrene CHRISTELLA Hasten, [facility]                                                                                                                   Lorrene CHRISTELLA Hasten, [facility]               [address] [place], [facility]               Lorrene CHRISTELLA Hasten, [facility]               Lorrene CHRISTELLA Hasten, [facility]  [place] [facility] Counselor/Therapist Progress Note  Patient ID: Rashon Westrup, MRN: [id number],    Date: 04/01/2024 This session was held via video teletherapy. The patient consented to the video teletherapy and was located in his home during this session. He is aware it is the responsibility of the patient to secure confidentiality on his end of the session. The provider was in a private home office for the duration of this sessi on.   The patient arrived on time for her Caregility session  Time Spent: 57 minutes, 2 PM to 2:57 PM  Treatment Type: Individual Therapy Reported Symptoms: anxiety, panic attacks The patient is still having significant back pain.  He cannot sit anywhere for very long and has to be careful where he sits.  There is still some unsteadiness although he did not report any falls over the past 2 weeks.  His biggest concern now is for  his wife.  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  She is meeting with her cardiologist again next week to see what possible treatment is for her.  He still thinks there may be some issue with her diaphragm but she is meeting with the pulmonologist also.  He is doing what he can to help her.  His daughter's birthday is March 11 so he knows that is a  difficult time for both he and his wife next week.  Typically they do something to remember her but because of the wife's current health issues they cannot go to [place] as they  had planned so they are talking about what to do to remember and celebrate her birthday.  We will continue to work on coping skills for helping with his anxiety and the progression of his Lewy body dementia diagnosis as well as processing his grief. Men tal Status Exam: Appearance:  Well Groomed  Behavior: Appropriate Motor: Pt. Reports some instability due to Lewey Body dementia Speech/Language:  Normal Rate Affect: Appropriate Mood: normal Thought process: normal Thought content:  WNL Sensory/Perceptual disturbances:  WNL Orientation: oriented to person, place, and situation Attention: Good Concentration: Good Memory: Impaired short term Fund of knowledge:  Good  Insight:  Good Judgment:  Good Impulse Control: Good  Risk Assessment: Danger to Self: No Self-injurious Behavior: No Danger to Others: No Duty to Warn:no Physical Aggression / Violence:No  Access to Firearms a concern: No  Gang Involvement:No  Treatment plan: Will employ cognitive behavioral therapy as well as grief and loss therapy for relief of anxiety and grief.  Goals of grief therapy or to have a healthier respon se to the loss of his daughter and less sadness as evidenced by patient report in therapy notes as well as to help him feel less overwhelmed by her loss.  Goals are to see a 50% reduction in grief symptoms over the next 6 months.  I will encourage the use of the patient telling his grief story about his daughter including encouraging him to bring pictures and memorabilia to help process his feelings including any feelings of guilt assoc iated with her loss.  We will use cognitive behavioral therapy to help identify and change anxiety producing thoughts and behavior patterns so as to improve his ability to better manage anxiety and stress, and manage thoughts and worrisome thinking contributing to anxiety.  We will also refresh and encourage dialectical behavior therapy distress tolerance and  mindfulness  skills with the intention of reducing anxiety by 50% over the next  6 months. Progress: 30% Interventions: Cognitive Behavioral Therapy  Diagnosis:PTSD, Bi-Polar D/O  Plan: F/U/ appointments scheduled weekly.  Lorrene CHRISTELLA Hasten, [facility]                                                                                                                   Lorrene CHRISTELLA Hasten, [facility]  [facility] Counselor/Therapist Progress Note  Patient ID: Traeger Sultana, MRN: [id number],    Date: 5/30/202 5 This session was held via video teletherapy. The patient consented to the video teletherapy and was  located in his home during this session. He is aware it is the responsibility of the patient to secure confidentiality on his end of the session. The provider was in a private home office for the duration of this session.   The patient arrived on time for her Caregility session  Time Spent: 58 minutes, 2 PM to 2:58 PM  Treatment  Type: Individual Therapy Reported Symptoms: anxiety, panic attacks A fairly difficult week because it was his daughter's birthday.  He said this year was more difficult than last year but he could not pinpoint why.  They did remember her about going out to a nice restaurant which is something her daughter always wanted to do.  He said they were able to laugh and joke about funny things that his daughter had said or done which is not  something they were able to do this time last year so he saw that as healthy grieving.  He knows that the time change threw things off a little bit for everybody and his wife still is not feeling well and his back has been bothering him.  He was not able to go to his first round of physical therapy this week because his back was bothering him more.  He has been thinking a lot more about his daughter which he feels comfortable with.  He  is thankful that it is getting warmer and staying light longer so he can get back to some of the coping skills such as fishing that he has done in the past.   We will continue to work on coping skills for helping with his anxiety and the progression of his Lewy body dementia diagnosis as well as processing his grief. Mental Status Exam: Appearance:  Well Groomed  Behavior: Appropriate Motor: Pt. Reports some instability due t o Lewey Body dementia Speech/Language:  Normal Rate Affect: Appropriate Mood: normal Thought process: normal Thought content:  WNL Sensory/Perceptual disturbances:  WNL Orientation: oriented to person, place, and  situation Attention: Good Concentration: Good Memory: Impaired short term Fund of knowledge:  Good Insight:  Good Judgment:  Good Impulse Control: Good  Risk Assessment: Danger to Self: No Self-injurio us  Behavior: No Danger to Others: No Duty to Warn:no Physical Aggression / Violence:No  Access to Firearms a concern: No  Gang Involvement:No  Treatment plan: Will employ cognitive behavioral therapy as well as grief and loss therapy for relief of anxiety and grief.  Goals of grief therapy or to have a healthier response to the loss of his daughter and less sadness as evidenced by patient report in therapy notes as well as to help  him feel less overwhelmed by her loss.  Goals are to see a 50% reduction in grief symptoms over the next 6 months.  I will encourage the use of the patient telling his grief story about his daughter including encouraging him to bring pictures and memorabilia to help process his feelings including any feelings of guilt associated with her loss.  We will use cognitive behavioral therapy to help identify and change anxiety producing thoug hts and behavior patterns so as to improve his ability to better manage anxiety and stress, and manage thoughts and worrisome thinking contributing to anxiety.  We will also refresh and encourage dialectical behavior therapy  distress tolerance and mindfulness skills with the intention of reducing anxiety by 50% over the next 6 months. Progress: 30% Interventions: Cognitive Behavioral Therapy  Diagnosis:PTSD, Bi-Polar D/O  Plan: F/ U/ appointments scheduled weekly.  Lorrene CHRISTELLA Hasten, [facility]                                                                                                                   Lorrene CHRISTELLA Hasten, [facility]               Lorrene CHRISTELLA Hasten, [facility]               Lorrene CHRISTELLA Hasten,  [facility]  [facility] Counselor/Therapist Progress Note  Patient ID: Warden Buffa, MRN: [id number],    Date: 04/01/2024 This session was he ld via video teletherapy. The patient consented to the video teletherapy and was located in his home during this session. He is aware it is the responsibility of the patient to secure confidentiality on his end of the session. The provider was in a private home office for the duration of this session.   The patient arrived on time for her Caregility session  Time Spent: 57 minutes, 2 PM to 2:57 PM  Treatment Type: Individual Ther apy Reported Symptoms: anxiety, panic attacks The patient is still having significant back pain.  He cannot sit anywhere for very long and has to be  careful where he sits.  There is still some unsteadiness although he did not report any falls over the past 2 weeks.  His biggest concern now is for his wife.  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  She  is meeting with her cardiologist again next week to see what possible treatment is for her.  He still thinks there may be some issue with her diaphragm but she is meeting with the pulmonologist also.  He is doing what he can to help her.  His daughter's birthday is March 11 so he knows that is a difficult time for both he and his wife next week.  Typically they do something to remember her but because of the wife's current health issu es they cannot go to [place] as they had planned so they are talking about what to do to remember and celebrate her birthday.  We will continue to work on coping skills for helping with his anxiety and the progression of his Lewy body dementia diagnosis as well as processing his grief. Mental Status Exam: Appearance:  Well Groomed  Behavior: Appropriate Motor: Pt. Reports some instability due to Lewey Body dementia Spee ch/Language:  Normal Rate Affect: Appropriate Mood: normal Thought process: normal Thought content:  WNL Sensory/Perceptual disturbances:  WNL Orientation: oriented to person, place, and situation Attention: Good Concentration: Good Memory: Impaired short term Fund of knowledge:  Good Insight:  Good Judgment:  Good Impulse Control: Good  Risk Assessment: Danger to Self: No Self-injurious Behavior: No Danger to  Others: No Duty to Warn:no Physical Aggression / Violence:No  Access to Firearms a concern: No  Gang Involvement:No  Treatment plan: Will employ cognitive behavioral therapy as well as grief and loss therapy for relief of anxiety and grief.  Goals of grief therapy or to have a healthier response to the loss of his daughter and  less sadness as evidenced by patient report in therapy notes as well as to help him feel less overwhelmed  by her loss.  Goals are to see a 50% reduction in grief symptoms over the next 6 months.  I will encourage the use of the patient telling his grief story about his daughter including encouraging him to bring pictures and memorabilia to help process his feelings including any feelings of guilt associated with her loss.  We will use cognitive behavioral therapy to help identify and change anxiety producing thoughts and behavior patterns s o as to improve his ability to better manage anxiety and stress, and manage thoughts and worrisome thinking contributing to anxiety.  We will also refresh and encourage dialectical behavior therapy distress tolerance and mindfulness  skills with the intention of reducing anxiety by 50% over the next 6 months. Progress: 30% Interventions: Cognitive Behavioral Therapy  Diagnosis:PTSD, Bi-Polar D/O  Plan: F/U/ appointments scheduled w eekly.  Lorrene CHRISTELLA Hasten, [facility]                                                                                                                   Lorrene CHRISTELLA Hasten, [facility]  [facility] Counselor/Therapist Progress Note  Patient ID: Amirr Achord, MRN: [id number],    Date: 04/01/2024 This session was held via video teletherapy. The patient consented to the video teletherapy and was located in his home during this sessio n. He is aware it is the responsibility of the patient to secure confidentiality on his end of the session. The provider was in a private home office for the duration of this session.   The patient arrived on time for her Caregility session  Time Spent: 57 minutes, 2 PM to 2:57 PM  Treatment Type: Individual Therapy Reported Symptoms: anxiety, panic attacks  The patient reported that he had not been sleeping well so he did spe ak with his psychiatrist about adjusting some meds.  Initially they adjusted too much up and he said he felt groggy for the entire next day but they have found a combination now where he feels like he is getting more rested and only feels a little drowsy shortly after waking up..  There have been a couple times where he stumbled and one of the times he felt down but did not get hurt the other times he was able to catch himself.  He is t rying to stay as active as he can walking daily and fishing with his wife when he can.  His biggest anxiety now is his wife has an upcoming surgery on her diaphragm in 2 weeks so he  knows that will be a fairly significant recovery time but also sees the difficulties she is having now and wants her to get some relief..  His son and daughter-in-law will be supports for them also.  He continues to speak with friends and family members as e ncouragement.  He also feels that he is grieving appropriately writing to his daughter on a regular basis about the things that are going on in his and his wife's lives.  We will continue to work on coping skills for helping with his anxiety and the progression of his Lewy body dementia diagnosis as well as processing his grief. Mental Status Exam: Appearance:  Well Groomed  Behavior: Appropriate Motor: Pt. Reports some instab ility due to Lewey Body dementia Speech/Language:  Normal Rate Affect: Appropriate Mood: normal Thought process: normal Thought content:  WNL Sensory/Perceptual disturbances:  WNL Orientation: oriented to person, place, and situation Attention: Good Concentration: Good Memory: Impaired short term Fund of knowledge:  Good Insight:  Good Judgment:  Good Impulse Control: Good  Risk Assessment: Danger to Self: No S elf-injurious Behavior: No Danger to Others: No Duty to Warn:no Physical Aggression / Violence:No  Access to Firearms a concern: No  Gang Involvement:No  Treatment plan: Will employ cognitive behavioral therapy as well as grief and loss therapy for relief of anxiety and grief.  Goals of grief therapy or to have a healthier response to the loss of his daughter and less sadness as evidenced by patient report in therapy notes as well  as to help him feel less overwhelmed by her loss.  Goals are to see a 50% reduction in grief symptoms over the next 6 months.  I will encourage the use of the patient telling his grief story about his daughter including encouraging him to bring pictures and memorabilia to help process his feelings including any feelings of guilt associated with her loss.  We will  use cognitive behavioral therapy to help identify and change anxiety prod ucing thoughts and behavior patterns so as to improve his ability  to better manage anxiety and stress, and manage thoughts and worrisome thinking contributing to anxiety.  We will also refresh and encourage dialectical behavior therapy distress tolerance and mindfulness skills with the intention of reducing anxiety by 50% over the next 6 months. Progress: 30% Interventions: Cognitive Behavioral Therapy  Diagnosis:PTSD, Bi-Polar D/O   Plan: F/U/ appointments scheduled weekly.  Lorrene CHRISTELLA Hasten, [facility]                                                                                                                   Lorrene CHRISTELLA Hasten, [facility]               Lorrene CHRISTELLA Hasten, [facility]               Lorrene CHRISTELLA Hasten, [facility]                Lorrene CHRISTELLA Hasten, [facility]               Lorrene CHRISTELLA Hasten, [facility]               Lorrene CHRISTELLA Hasten, [facility]               Lorrene CHRISTELLA Hasten, [facility]               Lorrene CHRISTELLA Hasten, [facility]  [facility] Counselor/Therapist Progress Note  Patient ID: Catherine Oak, MRN: [id number],    Date: 04/01/2024 This session was held via video teletherapy. The patient consented to the video teletherapy and was located in his home during this session. He is aware it is the responsibility of the patient  to secure confidentiality on his end of the session. The provider was in a private home office for the duration of this session.   The patient arrived on time for her Caregility session  Time Spent: 59 minutes, 2 PM to 2:59 PM  Treatment Type: Individual Therapy Reported Symptoms: anxiety, panic attacks The patient said he jokingly said to his wife that he had not had any pain issues in the cervical area and then a few days ago  he started having pain in his neck and shoulders.  He now sits related to degenerative disk and has some consistent pain somewhere in his spine with that but jokingly says it just depends on Wednesday which part of his body is decides to show up and hurt.  He is doing the best that he can.  He is still walking which he knows is good for him but does not have right now the strength to do as much in the morning as he has done in the past  so he is denying that to 3 times a day for consistency.  He is still doing some positive things and that he is staying in touch with his brother and other family members.  He is  reconnecting with friends from high school and he is which she is enjoying.  One of them has had a similar experience in that she lost her son in the same way the patient lost his daughter.  He said they did not think and Easter in which they set a place for hi s daughter at the table and put her picture there.  He initially had reservations about doing that he and his wife had made everyone feel as if she was really there with him.  He still feels that he is grieving in a healthy way.  He and his wife have always taken a trip around both of their birthdays which are 2 days apart.  That is now close to his daughter died so they have started taking some of her rashes with him and sprinkling the m where they go as a way of remembrance.  They are deciding what that looks like this year in July.  He does contract for safety having no thoughts of hurting himself or anyone else. We will continue to work on coping skills for helping with his anxiety and the progression of his Lewy body dementia diagnosis as well as processing his grief. Mental Status Exam: Appearance:  Well Groomed  Behavior: Appropriate Motor: Pt. Report s some instability due to Lewey Body dementia Speech/Language:  Normal Rate Affect: Appropriate Mood: normal Thought process: normal Thought content:  WNL Sensory/Perceptual disturbances:  WNL Orientation: oriented to person, place, and situation Attention: Good Concentration: Good Memory: Impaired short term Fund of knowledge:  Good Insight:  Good Judgment:  Good Impulse Control: Good  Risk Assessment: Danger to  Self: No Self-injurious Behavior: No Danger to Others: No Duty to Warn:no Physical Aggression / Violence:No  Access to Firearms a concern: No  Gang Involvement:No  Treatment plan: Will employ cognitive behavioral therapy as well as grief and loss therapy for relief of anxiety and grief.  Goals of grief therapy or  to have a healthier response to the loss  of his daughter and less sadness as evidenced by patient report in therapy  notes as well as to help him feel less overwhelmed by her loss.  Goals are to see a 50% reduction in grief symptoms over the next 6 months.  I will encourage the use of the patient telling his grief story about his daughter including encouraging him to bring pictures and memorabilia to help process his feelings including any feelings of guilt associated with her loss.  We will use cognitive behavioral therapy to help identify and change  anxiety producing thoughts and behavior patterns so as to improve his ability to better manage anxiety and stress, and manage thoughts and worrisome thinking contributing to anxiety.  We will also refresh and encourage dialectical behavior therapy distress tolerance and mindfulness skills with the intention of reducing anxiety by 50% over the next 6 months. Progress: 30% Interventions: Cognitive Behavioral Therapy  Diagnosis:PTSD,  Bi-Polar D/O  Plan: F/U/ appointments scheduled weekly.  Lorrene CHRISTELLA Hasten, [facility]                                                                                                                   Lorrene CHRISTELLA Hasten, [facility]  [facility] Counselor/Therapist Progress Note  Patient ID: Donzell Coller, MRN: [id number],    Date: 04/01/2024 This session was held via video teletherapy. The patient consented to the video telethe rapy and was located in his home during this session. He is aware it is the responsibility of the patient to secure confidentiality on his end of the session. The provider was in a private home office for the duration of this session.   The patient arrived on time for her Caregility session  Time Spent: 58 minutes, 2 PM to 2:58 PM  Treatment Type: Individual Therapy Reported Symptoms: anxiety, panic attacks A fairly difficult w eek because it was his daughter's birthday.  He said this year was more difficult than last year but he could not pinpoint why.  They did remember her about going out to a nice restaurant which is something her daughter always wanted to do.  He said they were able to laugh and joke about funny things that his daughter had said or done which is not something they were able to do this time last year so he saw that as healthy grieving.  He  knows that the time change threw things off a little bit for everybody and his wife still is not feeling well and his back has been bothering him.  He was not able to go to his first  round of physical therapy this week because his back was bothering him more.  He has been thinking a lot more about his daughter which he feels comfortable with.  He is thankful that it is getting warmer and staying light longer so he can get back to some  of the coping skills such as fishing that he has done in the past.   We will continue to work on coping skills for helping with his anxiety and the progression of his Lewy body dementia diagnosis as well as processing his grief. Mental Status Exam: Appearance:  Well Groomed  Behavior: Appropriate Motor: Pt. Reports some instability due to Lewey Body dementia Speech/Language:  Normal Rate Affect: Appropriate Mood: normal T hought process: normal Thought content:  WNL Sensory/Perceptual disturbances:  WNL Orientation: oriented to person, place, and situation Attention: Good Concentration: Good Memory: Impaired short term Fund of knowledge:  Good Insight:  Good Judgment:  Good Impulse Control: Good  Risk Assessment: Danger to Self: No Self-injurious Behavior: No Danger to Others: No Duty to Warn:no Physical Aggression / Violence:No   Access to Firearms a concern: No  Gang Involvement:No  Treatment plan: Will employ cognitive behavioral therapy as well as grief and loss therapy for relief of anxiety and grief.  Goals of grief therapy or to have a healthier response to the loss of his daughter and less sadness as evidenced by patient report in therapy notes as well as to help him feel less overwhelmed by her loss.  Goals are to see a 50% reduction in grief symptom s over the next 6 months.  I will encourage the use of the patient telling his grief story about his daughter including encouraging him to bring pictures and memorabilia to help process his feelings including any feelings of guilt associated with her loss.  We will use cognitive behavioral therapy to help identify and change anxiety producing thoughts and behavior  patterns so as to improve his ability to better manage anxiety and stress , and manage thoughts and worrisome thinking contributing to anxiety.  We will also refresh and encourage dialectical behavior therapy  distress tolerance and mindfulness skills with the intention of reducing anxiety by 50% over the next 6 months. Progress: 30% Interventions: Cognitive Behavioral Therapy  Diagnosis:PTSD, Bi-Polar D/O  Plan: F/U/ appointments scheduled weekly.  Lorrene CHRISTELLA Hasten, [facility]                                                                                                                    Lorrene CHRISTELLA Hasten, [facility]               Lorrene CHRISTELLA Hasten, [facility]               Lorrene CHRISTELLA Hasten, [facility]  [place] [facility] Counselor/Therapist Progress Note  Patient ID: Artemis Loyal, MRN: [id number],    Date: 04/01/2024 This session was held via video teletherapy. The patient consented to the video teletherapy and was located in his home d uring this session. He is aware it is the responsibility of the patient to secure confidentiality on his end of the session. The provider was in a private home office for the duration of this session.   The patient arrived on time for her Caregility session  Time Spent: 1:05 PM until 2 PM, 55 minutes Treatment Type: Individual Therapy Reported Symptoms: anxiety, panic attacks The patient said he came in from his walk this morni ng and saw what he thought was some sort of tic from his wife.  It was not the first time it happened and he wonders if it might be tardive dyskinesia because of some new medications that she is on.  She has a call into the doctor right now and hopes to hear something back soon.  They have narrowed down to the fact they think most of her discomfort in breathing is coming from the diaphragm and they are looking for alternatives for treat ment.  The patient continues to do those things which help him cope including walking, fishing when he can, spending time with his son and daughter-in-law and grandson.  He also has a way of continued coping with grief he is writing letters to his daughters as a way of expressing his feelings and says that is very beneficial.  He appears to be grieving in a healthy way but he knows that he to year anniversary of her death is coming up  and there is some grief there but says it is not overwhelming.  We will continue to work on coping skills for helping with his anxiety and the progression of his Lewy body dementia  diagnosis as well as processing his grief. Mental Status Exam: Appearance:  Well Groomed  Behavior: Appropriate Motor: Pt. Reports some instability due to Lewey Body dementia Speech/Language:  Normal Rate Affect: Appropriate Mood: normal Though t process: normal Thought content:  WNL Sensory/Perceptual disturbances:  WNL Orientation: oriented to person, place, and situation Attention: Good Concentration: Good Memory: Impaired short term Fund of knowledge:  Good Insight:  Good Judgment:  Good Impulse Control: Good  Risk Assessment: Danger to Self: No Self-injurious Behavior: No Danger to Others: No Duty to Warn:no Physical Aggression / Violence:No  Acc ess to Firearms a concern: No  Gang Involvement:No  Treatment plan: Will employ cognitive behavioral therapy as well as grief and loss therapy for relief of anxiety and grief.  Goals of grief therapy or to have a healthier response to the loss of his daughter and less sadness as evidenced by patient report in therapy notes as well as to help him feel less overwhelmed by her loss.  Goals are to see a 50% reduction in grief symptoms ove r the next 6 months.  I will encourage the use of the patient telling his grief story about his daughter including encouraging him to bring pictures and memorabilia to help process his feelings including any feelings of guilt associated with her loss.  We will use cognitive behavioral therapy to help identify and change anxiety producing thoughts and behavior patterns so as to improve his ability to better manage anxiety and stress, and  manage thoughts and worrisome thinking contributing to anxiety.  We will also refresh and encourage dialectical behavior therapy distress tolerance and  mindfulness skills with the intention of reducing anxiety by 50% over the next 6 months. Progress: 30% Interventions: Cognitive Behavioral Therapy  Diagnosis:PTSD, Bi-Polar D/O  Plan: F/U/ appointments  scheduled weekly.  Lorrene CHRISTELLA Hasten, [facility]                                                                                                                    Lorrene CHRISTELLA Hasten, [facility]  [place] [facility] Counselor/Therapist Progress Note  Patient ID: Georgia Baria, MRN: [id number],    Date: 04/01/2024 This session was held via video teletherapy. The patient consented to the video teletherapy and was located in his home during this session. He is aware it is the responsibility of the patient to secure con fidentiality on his end of the session. The provider was in a private home office for the duration of  this session.   The patient arrived on time for her Caregility session  Time Spent: 59 minutes, 2 PM to 2:59 PM  Treatment Type: Individual Therapy Reported Symptoms: anxiety, panic attacks The patient continues to present with physical pain and both his back and his knee.  He started with a new physical therapist and told him  that there was a lot of arthritis in both knees and or certain things he cannot do but they had him do that anyway and now for the past 3 days he has been in significant knee pain.  He has continued to walk because he knows that is good for him in various ways but says it has been painful.  His wife also was not feeling well but she is going back to the doctor next week to have an ablation and hopes that will bring her some relief.  For  the most part he has been at the last couple weeks reaching back out and hearing from some old friends as well as time with his son and son's family.  He says that his wife's cousin is coming down for a weekend soon and they enjoy time with her and that he will go to his brother's house sometime in the next few weeks.  He recognizes the need for people that he cares about in helping with his depression. We will continue to work on cop ing skills for helping with his anxiety and the progression of his Lewy body dementia diagnosis as well as processing his grief. Mental Status Exam: Appearance:  Well Groomed  Behavior: Appropriate Motor: Pt. Reports some instability due to Lewey Body dementia Speech/Language:  Normal Rate Affect: Appropriate Mood: normal Thought process: normal Thought content:  WNL Sensory/Perceptual disturbances:  WNL Orientation:  oriented to person, place, and situation Attention: Good Concentration: Good Memory: Impaired short term Fund of knowledge:  Good Insight:  Good Judgment:  Good Impulse Control: Good  Risk Assessment: Danger to Self: No Self-injurious Behavior: No Danger to Others:  No Duty to Warn:no Physical Aggression / Violence:No  Access to Firearms a concern: No  Gang Involvement:No  Treatment plan: Will employ cognitive behavi oral therapy as well as grief and loss therapy for relief of anxiety and grief.  Goals of grief therapy or to have a healthier response to the loss of his daughter and less sadness as evidenced by patient report in therapy notes as well as to help him feel less overwhelmed by her loss.  Goals are to see a 50% reduction in grief symptoms over the next 6 months.  I will encourage the use of the patient telling his grief story about his da ughter including encouraging him to bring pictures and memorabilia to help process his feelings including any feelings of guilt associated with her loss.  We will use cognitive behavioral therapy to help identify and change anxiety producing thoughts and behavior patterns so as to improve his ability to better manage anxiety and stress, and manage thoughts and worrisome thinking contributing to anxiety.  We will also  refresh and encoura ge dialectical behavior therapy distress tolerance and mindfulness skills with the intention of reducing anxiety by 50% over the next 6 months. Progress: 30% Interventions: Cognitive Behavioral Therapy  Diagnosis:PTSD, Bi-Polar D/O  Plan: F/U/ appointments scheduled weekly.  Lorrene CHRISTELLA Hasten, [facility]                                                                                                                    Lorrene CHRISTELLA Hasten, [facility]               Lorrene CHRISTELLA Hasten, [facility]               Lorrene CHRISTELLA Hasten, [facility]               Lorrene CHRISTELLA Hasten, [facility]  [place] [facility] Counselor/Therapist Progress Note  Patient ID: Edmar Blankenburg, MRN: [id number],    Date: 04/01/2024 This session was held via video teletherapy. The patient consented to the video teletherapy and was located in his home during this session. He is aware it is th e responsibility of the patient to secure confidentiality on his end of the session. The provider was in a private home office for the duration of this session.   The patient arrived on time for her Caregility session  Time Spent: 57 minutes, 2 PM to 2:57 PM  Treatment Type: Individual Therapy Reported Symptoms: anxiety, panic attacks The patient is still having significant back pain.  He cannot sit anywhere for very long and h as to be careful where he sits.  There is still some unsteadiness although he did not report any falls over the past 2 weeks.  His biggest concern now is for his wife.  She has been  having some difficulty breathing and they did a test so they found a small hole on the back side of her heart.  She is meeting with her cardiologist again next week to see what possible treatment is for her.  He still thinks there may be some issue with her  diaphragm but she is meeting with the pulmonologist also.  He is doing what he can to help her.  His daughter's birthday is March 11 so he knows that is a difficult time for both he and his wife next week.  Typically they do something to remember her but because of the wife's current health issues they cannot go to [place] as they had planned so they are talking about what to do to remember and celebrate her birthday.  We will  continue to work on coping skills for helping with his anxiety and the progression of his Lewy body dementia diagnosis as well as processing his grief. Mental Status Exam: Appearance:  Well Groomed  Behavior: Appropriate Motor: Pt. Reports some instability due to Lewey Body dementia Speech/Language:  Normal Rate Affect: Appropriate Mood: normal Thought process: normal Thought content:  WNL Sensory/Perceptual disturbance s:  WNL Orientation: oriented to person, place, and situation Attention: Good Concentration: Good Memory: Impaired short term Fund of knowledge:  Good Insight:  Good Judgment:  Good Impulse Control: Good  Risk Assessment: Danger to Self: No Self-injurious Behavior: No Danger to Others: No Duty to Warn:no Physical Aggression / Violence:No  Access to Firearms a concern: No  Gang Involvement:No  Treatment plan: Will  employ cognitive behavioral therapy as well as grief and loss therapy for relief of anxiety and grief.  Goals of grief therapy or to have a healthier response to the loss of his daughter and less sadness as evidenced by patient report in therapy notes as well as to help him feel less overwhelmed by her loss.  Goals are to see a 50% reduction in grief symptoms over  the next 6 months.  I will encourage the use of the patient telling his  grief story about his daughter including encouraging him to bring pictures and memorabilia to help process his feelings including any feelings of guilt associated with her loss.  We will use cognitive behavioral therapy to help identify and change anxiety producing thoughts and behavior patterns so as to improve his ability to better manage anxiety and stress, and manage thoughts and worrisome thinking contributing to anxiety.  We will  also refresh and encourage dialectical behavior therapy distress tolerance and mindfulness  skills with the intention of reducing anxiety by 50% over the next 6 months. Progress: 30% Interventions: Cognitive Behavioral Therapy  Diagnosis:PTSD, Bi-Polar D/O  Plan: F/U/ appointments scheduled weekly.  Lorrene CHRISTELLA Hasten, [facility]                                                                                                                    Lorrene CHRISTELLA Hasten, [facility]  [place] [facility] Counselor/Therapist Progress Note  Patient ID: Merland Holness, MRN: [id number],    Date: 04/01/2024 This session was held via video teletherapy. The patient consented to the video teletherapy and was located in his home during this session. He is aware it is the responsibility of the patient to secure confidentiality on his end of the session. The provider was in a private home  office for the duration of this session.   The patient arrived on time for her Caregility session  Time Spent: 58 minutes, 2 PM to 2:58 PM  Treatment Type: Individual Therapy Reported Symptoms: anxiety, panic attacks A fairly difficult week because it was his daughter's birthday.  He said this year was more difficult than last year but he could not pinpoint why.  They did remember her about going out to a nice restaurant which  is something her daughter always wanted to do.  He said they were able to laugh and joke about funny things that his daughter had said or done which is not something they were able to do this time last year so he saw that as healthy grieving.  He knows that the time change threw things off a little bit for everybody and his wife still is not feeling well and his back has been bothering him.  He was not able to go to his first round of  physical therapy this week because his back was bothering him more.  He has been thinking a lot more about his daughter which he feels comfortable with.  He is thankful that it is  getting warmer and staying light longer so he can get back to some of the coping skills such as fishing that he has done in the past.   We will continue to work on coping skills for helping with his anxiety and the progression of his Lewy body dementia diag nosis as well as processing his grief. Mental Status Exam: Appearance:  Well Groomed  Behavior: Appropriate Motor: Pt. Reports some instability due to Lewey Body dementia Speech/Language:  Normal Rate Affect: Appropriate Mood: normal Thought process: normal Thought content:  WNL Sensory/Perceptual disturbances:  WNL Orientation: oriented to person, place, and situation Attention: Good Concentration: Good Memory: Im paired short term Fund of knowledge:  Good Insight:  Good Judgment:  Good Impulse Control: Good  Risk Assessment: Danger to Self: No Self-injurious Behavior: No Danger to Others: No Duty to Warn:no Physical Aggression / Violence:No  Access to Firearms a concern: No  Gang Involvement:No  Treatment plan: Will employ cognitive behavioral therapy as well as grief and loss therapy for relief of anxiety and grief.  Goals of  grief therapy or to have a healthier response to the loss of his daughter and less sadness as evidenced by patient report in therapy notes as well as to help him feel less overwhelmed by her loss.  Goals are to see a 50% reduction in grief symptoms over the next 6 months.  I will encourage the use of the patient telling his grief story about his daughter including encouraging him to bring pictures and memorabilia to help process his fee lings including any feelings of guilt associated with her loss.  We will use cognitive behavioral therapy to help identify and change anxiety producing thoughts and behavior patterns so as to improve his ability to better manage anxiety and stress, and manage thoughts and worrisome thinking contributing to anxiety.  We will also refresh and encourage dialectical  behavior therapy distress  tolerance and mindfulness skills with the intenti on of reducing anxiety by 50% over the next 6 months. Progress: 30% Interventions: Cognitive Behavioral Therapy  Diagnosis:PTSD, Bi-Polar D/O  Plan: F/U/ appointments scheduled weekly.  Lorrene CHRISTELLA Hasten, [facility]                                                                                                                    Lorrene CHRISTELLA Hasten, [facility]               Lorrene CHRISTELLA Hasten, [facility]               Lorrene CHRISTELLA Hasten, [facility]  [place] [facility] Counselor/Therapist Progress Note  Patient ID: Goran Olden, MRN:  [id number],    Date: 04/01/2024 This session was held via video teletherapy. The patient consented to the video teletherapy and was located in his home during this session. He is aware it is the responsibility of the patient to secure confidentiality on his end of the session. The provider was in a private home office for the durati on of this session.   The patient arrived on time for her Caregility session  Time Spent: 57 minutes, 2 PM to 2:57 PM  Treatment Type: Individual Therapy Reported Symptoms: anxiety, panic attacks The patient is still having significant back pain.  He cannot sit anywhere for very long and has to be careful where he sits.  There is still some unsteadiness although he did not report any falls over the past 2 weeks.  His biggest co ncern now is for his wife.  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  She is meeting with her cardiologist again next week to see what possible treatment is for her.  He still thinks there may be some issue with her diaphragm but she is meeting with the pulmonologist also.  He is doing what he can to help her.  His daughter's birthday is March 11 so he  knows that is a difficult time for both he and his wife next week.  Typically they do something to remember her but because of the wife's current health issues they cannot go to [place] as they had planned so they are talking about what to do to remember and celebrate her birthday.  We will continue to work on coping skills for helping with his anxiety and the progression of his Lewy body dementia diagnosis as well as processing  his grief. Mental Status Exam: Appearance:  Well Groomed  Behavior: Appropriate Motor: Pt. Reports some instability due to Lewey Body dementia Speech/Language:  Normal Rate Affect: Appropriate Mood: normal Thought process: normal Thought content:  WNL Sensory/Perceptual  disturbances:  WNL Orientation: oriented to person, place, and situation Attention: Good Concentration: Good Memory: Impaired short term Fund of  knowledge:  Good Insight:  Good Judgment:  Good Impulse Control: Good  Risk Assessment: Danger to Self: No Self-injurious Behavior: No Danger to Others: No Duty to Warn:no Physical Aggression / Violence:No  Access to Firearms a concern: No  Gang Involvement:No  Treatment plan: Will employ cognitive behavioral therapy as well as grief and loss therapy for relief of anxiety and grief.  Goals of grief therapy or to have a  healthier response to the loss of his daughter and less sadness as evidenced by patient report in therapy notes as well as to help him feel less overwhelmed by her loss.  Goals are to see a 50% reduction in grief symptoms over the next 6 months.  I will encourage the use of the patient telling his grief story about his daughter including encouraging him to bring pictures and memorabilia to help process his feelings including any feeling s of guilt associated with her loss.  We will use cognitive behavioral therapy to help identify and change anxiety producing thoughts and behavior patterns so as to improve his ability to better manage anxiety and stress, and manage thoughts and worrisome thinking contributing to anxiety.  We will also refresh and encourage dialectical behavior therapy distress tolerance and mindfulness skills  with the intention of reducing anxiety by 5 0% over the next 6 months. Progress: 30% Interventions: Cognitive Behavioral Therapy  Diagnosis:PTSD, Bi-Polar D/O  Plan: F/U/ appointments scheduled weekly.  Lorrene CHRISTELLA Hasten, [facility]                                                                                                                   Lorrene CHRISTELLA Hasten, [facility]  [place] [facility] Counselor/Therapist Progress Note  Patient ID: Aadith Raudenbush, MRN: [id number],     Date: 04/01/2024 This session was held via video teletherapy. The patient consented to the video teletherapy and was located in his home during this session. He is aware it is the responsibility of the patient to secure confidentiality on his end of the session. The provider was in a private home office for the duration of this session.   The patient arrived on time for her Caregility session  Time Spent: 57 minutes, 2 PM to 2:5 7 PM  Treatment Type: Individual Therapy Reported Symptoms: anxiety, panic attacks  We will continue to work on coping skills for helping with his anxiety and the progression of his  Lewy body dementia diagnosis as well as processing his grief. Mental Status Exam: Appearance:  Well Groomed  Behavior: Appropriate Motor: Pt. Reports some instability due to Lewey Body dementia Speech/Language:  Normal Rate Affect: Appropriate  Mood: normal Thought process: normal Thought content:  WNL Sensory/Perceptual disturbances:  WNL Orientation: oriented to person, place, and situation Attention: Good Concentration: Good Memory: Impaired short term Fund of knowledge:  Good Insight:  Good Judgment:  Good Impulse Control: Good  Risk Assessment: Danger to Self: No Self-injurious Behavior: No Danger to Others: No Duty to Warn:no Physical Aggressio n / Violence:No  Access to Firearms a concern: No  Gang Involvement:No  Treatment plan: Will employ cognitive behavioral therapy as well as grief and loss therapy for relief of anxiety and grief.  Goals of grief therapy or to have a healthier response to the loss of his daughter and less sadness as evidenced by patient report in therapy notes as well as to help him feel less overwhelmed by her loss.  Goals are to see a 50% reduction  in grief symptoms over the next 6 months.  I will encourage the use of the patient telling his grief story about his daughter including encouraging him to bring pictures and memorabilia to help process his feelings including any feelings of guilt associated with her loss.  We will use cognitive behavioral therapy to help identify and change anxiety producing thoughts and behavior patterns so as to improve his ability to better manage an xiety and stress, and manage thoughts and worrisome thinking contributing to anxiety.  We will also refresh and encourage dialectical behavior therapy distress tolerance and mindfulness skills with the intention of reducing anxiety by 50% over the next 6 months. Progress: 30% Interventions: Cognitive Behavioral Therapy  Diagnosis:PTSD, Bi-Polar D/O  Plan: F/U/  appointments scheduled weekly.  Lorrene CHRISTELLA Hasten, [facility]                                                                                                                    Lorrene CHRISTELLA Hasten, [facility]               Lorrene CHRISTELLA Hasten, [facility]               Lorrene CHRISTELLA Hasten, [facility]  Lorrene CHRISTELLA Hasten, [facility]               Lorrene CHRISTELLA Hasten, [facility]                Lorrene CHRISTELLA Hasten, [facility]               Lorrene CHRISTELLA Hasten, [facility]  [place] [facility] Counselor/Therapist Progress Note  Patient ID: Shion Bluestein, MRN: [id number],    Date:  04/01/2024 This session was held via video teletherapy. The patient consented to the video teletherapy and was located in his home during this session. He is aware it is the responsibility of the patient to secure confidentiality on his end of the session. The provider was in a private home office for the duration of this session.   T he patient arrived on time for her Caregility session  Time Spent: 39 minutes, 2 PM to 2:39 PM  Treatment Type: Individual Therapy Reported Symptoms: anxiety, panic attacks The patient was not feeling well today.  He started feeling really earlier in the week having a fever bad headaches body aches.  He felt a little better yesterday but feels a little bit of a resurgence in headaches and body aches and is running a low-grade fever .  His wife is feeling some of the same symptoms.  For the most part he says he is doing well.  They did increase his memory medication because they were starting to see some increasing decline in short-term memory and he said until recently his long-term memory had been good but he was starting to see a difference in how he used to markers to remember what happened at certain times historically.  He feels the medication has helped and  has brightened his mood a little also.  He is still trying to walk every day and fish when he can and has enjoyed watching the [employer] as a good distraction.  He reports that he feels he is managing mood fairly well.  He has an appointment in 2 weeks.  He does contract for safety having no thoughts of hurting himself or anyone else. We will continue to work on coping skills for helping with his anxiety and the progres sion of his Lewy body dementia diagnosis as well as processing his grief. Mental Status Exam: Appearance:  Well Groomed  Behavior: Appropriate Motor: Pt. Reports some instability due to Lewey Body dementia Speech/Language:  Normal  Rate Affect: Appropriate Mood: normal Thought process: normal Thought content:  WNL Sensory/Perceptual disturbances:  WNL Orientation: oriented to person, place, and situation Attention: Go od Concentration: Good Memory: Impaired short term Fund of knowledge:  Good Insight:  Good Judgment:  Good Impulse Control: Good  Risk Assessment: Danger to Self: No Self-injurious Behavior: No Danger to Others: No Duty to Warn:no Physical Aggression / Violence:No  Access to Firearms a concern: No  Gang Involvement:No  Treatment plan: Will employ cognitive behavioral therapy as well as grief and loss therapy for reli ef of anxiety and grief.  Goals of grief therapy or to have a healthier response to the loss of his daughter and less sadness as evidenced by patient report in therapy notes as well as to help him feel less overwhelmed by her loss.  Goals are to see a 50% reduction in grief symptoms over the next 6 months.  I will encourage the use of the patient telling his grief story about his daughter including encouraging him to bring pictures and  memorabilia to help process his feelings including any feelings of guilt associated with her loss.  We will use cognitive behavioral therapy to help identify and change anxiety producing thoughts and behavior patterns so as to improve his ability to better manage anxiety and stress, and manage thoughts and worrisome thinking contributing to anxiety.  We will also refresh  and encourage dialectical behavior therapy distress tolerance and  mindfulness skills with the intention of reducing anxiety by 50% over the next 6 months. Progress: 30% Interventions: Cognitive Behavioral Therapy  Diagnosis:PTSD, Bi-Polar D/O  Plan: F/U/ appointments scheduled weekly.  Lorrene CHRISTELLA Hasten,  [facility]                                                                                                                    Lorrene CHRISTELLA Hasten, [facility]  [facility] Counselor/Therapist Progr ess Note  Patient ID: Nagee Goates, MRN: [id number],    Date: 04/01/2024 This session was held via video teletherapy. The patient consented to the video teletherapy and was located in his home during this session. He is aware it is the responsibility of the patient to secure confidentiality on his end of the session. The provider was in a private home office for the duration of this session.   The patient arrived  on time for  her Caregility session  Time Spent: 58 minutes, 2 PM to 2:58 PM  Treatment Type: Individual Therapy Reported Symptoms: anxiety, panic attacks A fairly difficult week because it was his daughter's birthday.  He said this year was more difficult than last year but he could not pinpoint why.  They did remember her about going out to a nice restaurant which is something her daughter always wanted to do.  He said they were able to laug h and joke about funny things that his daughter had said or done which is not something they were able to do this time last year so he saw that as healthy grieving.  He knows that the time change threw things off a little bit for everybody and his wife still is not feeling well and his back has been bothering him.  He was not able to go to his first round of physical therapy this week because his back was bothering him more.  He has bee n thinking a lot more about his daughter which he feels comfortable with.  He is thankful that it is getting warmer and staying light longer so he can get back to some of the coping skills such as fishing that he has done in the past.   We will continue to work on coping skills for helping with his anxiety and the progression of his Lewy body dementia diagnosis as well as processing his grief. Mental Status Exam: Appearance:  Well  Groomed  Behavior: Appropriate Motor: Pt. Reports some instability due to Lewey Body dementia Speech/Language:  Normal Rate Affect: Appropriate Mood: normal Thought process: normal Thought content:  WNL Sensory/Perceptual disturbances:  WNL Orientation: oriented to person, place, and situation Attention: Good Concentration: Good Memory: Impaired short term Fund of knowledge:  Good Insight:  Good Judgment:  Good  Impulse Control: Good  Risk Assessment: Danger to Self: No Self-injurious Behavior: No Danger to Others: No Duty to Warn:no Physical Aggression / Violence:No   Access to Firearms a concern: No  Gang Involvement:No  Treatment plan: Will employ cognitive behavioral therapy as well as grief and loss therapy for relief of anxiety and grief.  Goals of grief therapy or to have a healthier response to the loss of his daughter and l ess sadness as evidenced by patient report in therapy notes as well as to help him feel less overwhelmed by her loss.  Goals are to see a 50% reduction in grief symptoms over the next 6 months.  I will encourage the use of the patient telling his grief story about his daughter including encouraging him to bring pictures and memorabilia to help process his feelings including any feelings of guilt associated with her loss.  We will use co gnitive behavioral therapy to help identify and change anxiety producing thoughts and behavior patterns so as to improve his ability to better manage anxiety and stress, and manage thoughts and worrisome thinking contributing to anxiety.  We will also refresh and encourage dialectical behavior therapy  distress tolerance and mindfulness skills with the intention of reducing anxiety by 50% over the next 6 months. Progress: 30% Intervent ions: Cognitive Behavioral Therapy  Diagnosis:PTSD, Bi-Polar D/O  Plan: F/U/ appointments scheduled weekly.  Lorrene CHRISTELLA Hasten, [facility]                                                                                                                   Lorrene CHRISTELLA Hasten, [facility]               Lorrene CHRISTELLA Hasten, [facility]                Lorrene CHRISTELLA Hasten,  [facility]  [facility] [facility] Counselor/Therapist Progress Note  Patient ID: Zamar Odwyer, MRN: [id number],    Date: 04/01/2024 This session was held via video teletherapy. The patient consented to the video teletherapy and was located in his home during this session. He is aware it is the responsibility of the patient to secure confidentiality on his end of the session. The provider was in a private home office for the duration of this session.   The patient arrived on time for her Caregility session  Time Sp ent: 1:05 PM until 2 PM, 55 minutes Treatment Type: Individual Therapy Reported Symptoms: anxiety, panic attacks The patient said he came in from his walk this morning and saw what he thought was some sort of tic from his  wife.  It was not the first time it happened and he wonders if it might be tardive dyskinesia because of some new medications that she is on.  She has a call into the doctor right now and hopes to hear something bac k soon.  They have narrowed down to the fact they think most of her discomfort in breathing is coming from the diaphragm and they are looking for alternatives for treatment.  The patient continues to do those things which help him cope including walking, fishing when he can, spending time with his son and daughter-in-law and grandson.  He also has a way of continued coping with grief he is writing letters to his daughters as a way of  expressing his feelings and says that is very beneficial.  He appears to be grieving in a healthy way but he knows that he to year anniversary of her death is coming up and there is some grief there but says it is not overwhelming.  We will continue to work on coping skills for helping with his anxiety and the progression of his Lewy body dementia diagnosis as well as processing his grief. Mental Status Exam: Appearance:  Well Groom ed  Behavior: Appropriate Motor: Pt. Reports some instability due to Lewey Body dementia Speech/Language:  Normal Rate Affect: Appropriate Mood: normal Thought process: normal Thought content:  WNL Sensory/Perceptual disturbances:  WNL Orientation: oriented to person, place, and situation Attention: Good Concentration: Good Memory: Impaired short term Fund of knowledge:  Good Insight:  Good Judgment:  Good Impu lse Control: Good  Risk Assessment: Danger to Self: No Self-injurious Behavior: No Danger to Others: No Duty to Warn:no Physical Aggression / Violence:No  Access to Firearms a concern: No  Gang Involvement:No  Treatment plan: Will employ cognitive behavioral therapy as well as grief and loss therapy for relief of anxiety and grief.  Goals of grief therapy or to have a healthier response to the  loss of his daughter and less s adness as evidenced by patient report in therapy notes as well as to help him feel less overwhelmed by her loss.  Goals are to see a 50% reduction in grief symptoms over the next 6 months.  I will encourage the use of the patient telling his grief story about his daughter including encouraging him to bring pictures and memorabilia to help process his feelings including any feelings of guilt associated with her loss.  We will use cogniti ve behavioral therapy to help identify and change anxiety producing thoughts and behavior patterns so as to improve his ability to better manage anxiety and stress, and manage thoughts and worrisome thinking contributing to anxiety.  We will also refresh and encourage dialectical behavior therapy distress tolerance  and mindfulness skills with the intention of reducing anxiety by 50% over the next 6 months. Progress: 30% Interventions:  Cognitive Behavioral Therapy  Diagnosis:PTSD, Bi-Polar D/O  Plan: F/U/ appointments scheduled weekly.  Lorrene CHRISTELLA Hasten, [facility]                                                                                                                   Lorrene CHRISTELLA Hasten, [facility]  [place] [facility] Counselor/Therapist Progress Note  Patient ID: Garner Dullea, MRN: [id number],    Date: 04/01/2024 This session was held via video teleth erapy. The patient consented to the video teletherapy and was located in his home during this session. He is aware it is the responsibility of the patient to secure confidentiality on his end of the session. The provider was in a private home office for the duration of this session.   The patient arrived on time for her Caregility session  Time Spent: 59 minutes, 2 PM to 2:59 PM  Treatment Type: Individual Therapy Reported Sympt oms: anxiety, panic attacks The patient continues to present with physical pain and both his back and his knee.  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  He has continued to walk because he knows that is good for him in various ways but says it  has been painful.  His wife also was not feeling well but she is going back to the doctor next week to have an ablation and hopes that will bring her some relief.  For the most part he  has been at the last couple weeks reaching back out and hearing from some old friends as well as time with his son and son's family.  He says that his wife's cousin is coming down for a weekend soon and they enjoy time with her and that he will go to his  brother's house sometime in the next few weeks.  He recognizes the need for people that he cares about in helping with his depression. We will continue to work on coping skills for helping with his anxiety and the progression of his Lewy body dementia diagnosis as well as processing his grief. Mental Status Exam: Appearance:  Well Groomed  Behavior: Appropriate Motor: Pt. Reports some instability due to Lewey Body dementia Sp eech/Language:  Normal Rate Affect: Appropriate Mood: normal Thought process: normal Thought content:  WNL Sensory/Perceptual disturbances:  WNL Orientation: oriented to person, place, and situation Attention: Good Concentration: Good Memory: Impaired short term Fund of knowledge:  Good Insight:  Good Judgment:  Good Impulse Control: Good  Risk Assessment: Danger to Self: No Self-injurious Behavior: No Danger t o Others: No Duty to Warn:no Physical Aggression / Violence:No  Access to Firearms a concern: No  Gang Involvement:No  Treatment plan: Will employ cognitive behavioral therapy as well as grief and loss therapy for relief of anxiety and grief.  Goals of grief therapy or to have a healthier response to the loss of his daughter and less sadness as evidenced by patient report in therapy notes as well as to help him feel less overwhelme d by her loss.  Goals are to see a 50% reduction in grief symptoms over the next 6 months.  I will encourage the use of the patient telling his grief story about his daughter including encouraging him to bring pictures and memorabilia to help process his feelings including any feelings of guilt associated with her loss.  We will use cognitive behavioral therapy to help  identify and change anxiety producing thoughts and behavior patterns  so as to improve his ability to better manage anxiety and stress, and manage thoughts and worrisome thinking contributing to anxiety.  We will  also refresh and encourage dialectical behavior therapy distress tolerance and mindfulness skills with the intention of reducing anxiety by 50% over the next 6 months. Progress: 30% Interventions: Cognitive Behavioral Therapy  Diagnosis:PTSD, Bi-Polar D/O  Plan: F/U/ appointments scheduled  weekly.  Lorrene CHRISTELLA Hasten, [facility]                                                                                                                   Lorrene CHRISTELLA Hasten, [facility]               Lorrene CHRISTELLA Hasten, [facility]               Lorrene CHRISTELLA Hasten, [facility]               Lorrene CHRISTELLA Hasten, [facility]  [facility] [facility] Counselor/Therapist Progress Note  Patient ID: Eliott Amparan, MRN: [id number],    Date: 5/30 /2025 This session was held via video teletherapy. The patient consented to the video teletherapy and was located in his home during this session. He is aware it is the responsibility of the patient to secure confidentiality on his end of the session. The provider was in a private home office for the duration of this session.   The patient arrived on time for her Caregility session  Time Spent: 57 minutes, 2 PM to 2:57 PM  Treat ment Type: Individual Therapy Reported Symptoms: anxiety, panic attacks The patient is still having significant back pain.  He cannot sit anywhere for very long and has to be careful where he sits.  There is still some unsteadiness although he did not report any falls over the past 2 weeks.  His biggest concern now is for his wife.  She has been having some difficulty breathing and they did a test so they found a small hole on the bac k side of her heart.  She is meeting with her cardiologist again next week to see what possible treatment is for her.  He still thinks there may be some issue with her diaphragm but she is meeting with the pulmonologist also.  He is doing what he can to help her.  His daughter's birthday is March 11 so he knows that is a difficult time for both he and his wife next week.  Typically they do something to remember her but because of the  wife's current health issues they cannot go to [place] as they had planned so they are talking about what to do to remember and celebrate her birthday.  We will continue to work  on coping skills for helping with his anxiety and the progression of his Lewy body dementia diagnosis as well as processing his grief. Mental Status Exam: Appearance:  Well Groomed  Behavior: Appropriate Motor: Pt. Reports some instability due to  Lewey Body dementia Speech/Language:  Normal Rate Affect: Appropriate Mood: normal Thought process: normal Thought content:  WNL Sensory/Perceptual disturbances:  WNL Orientation: oriented to person, place, and situation Attention: Good Concentration: Good Memory: Impaired short term Fund of knowledge:  Good Insight:  Good Judgment:  Good Impulse Control: Good  Risk Assessment: Danger to Self: No Self-injuriou s Behavior: No Danger to Others: No Duty to Warn:no Physical Aggression / Violence:No  Access to Firearms a concern: No  Gang Involvement:No  Treatment plan: Will employ cognitive behavioral therapy as well as grief and loss therapy for relief of anxiety and grief.  Goals of grief therapy or to have a healthier response to the loss of his daughter and less sadness as evidenced by patient report in therapy notes as well as to help  him feel less overwhelmed by her loss.  Goals are to see a 50% reduction in grief symptoms over the next 6 months.  I will encourage the use of the patient telling his grief story about his daughter including encouraging him to bring pictures and memorabilia to help process his feelings including any feelings of guilt associated with her loss.  We will use cognitive behavioral therapy to help identify and change anxiety producing though ts and behavior patterns so as to improve his ability to better manage anxiety and stress, and manage thoughts and worrisome thinking contributing to anxiety.  We will also refresh and encourage dialectical behavior therapy distress tolerance and mindfulness skills  with the intention of reducing anxiety by 50% over the next 6 months. Progress: 30% Interventions:  Cognitive Behavioral Therapy  Diagnosis:PTSD, Bi-Polar D/O  Plan: F/U / appointments scheduled weekly.  Lorrene CHRISTELLA Hasten, [facility]                                                                                                                   Lorrene CHRISTELLA Hasten, [facility]  [place] [facility] Counselor/Therapist Progress Note  Patient ID: Chukwudi Ewen, MRN: [id number],    Date: 04/01/2024 This session was held via video teletherapy. The patient consented to the video teletherapy and was located in h is home during this session. He is aware it is the responsibility of the patient to secure confidentiality on  his end of the session. The provider was in a private home office for the duration of this session.   The patient arrived on time for her Caregility session  Time Spent: 58 minutes, 2 PM to 2:58 PM  Treatment Type: Individual Therapy Reported Symptoms: anxiety, panic attacks A fairly difficult week because it was his da ughter's birthday.  He said this year was more difficult than last year but he could not pinpoint why.  They did remember her about going out to a nice restaurant which is something her daughter always wanted to do.  He said they were able to laugh and joke about funny things that his daughter had said or done which is not something they were able to do this time last year so he saw that as healthy grieving.  He knows that the time chan ge threw things off a little bit for everybody and his wife still is not feeling well and his back has been bothering him.  He was not able to go to his first round of physical therapy this week because his back was bothering him more.  He has been thinking a lot more about his daughter which he feels comfortable with.  He is thankful that it is getting warmer and staying light longer so he can get back to some of the coping skills such  as fishing that he has done in the past.   We will continue to work on coping skills for helping with his anxiety and the progression of his Lewy body dementia diagnosis as well as processing his grief. Mental Status Exam: Appearance:  Well Groomed  Behavior: Appropriate Motor: Pt. Reports some instability due to Lewey Body dementia Speech/Language:  Normal Rate Affect: Appropriate Mood: normal Thought process: normal T hought content:  WNL Sensory/Perceptual disturbances:  WNL Orientation: oriented to person, place, and situation Attention: Good Concentration: Good Memory: Impaired short term Fund of knowledge:  Good Insight:  Good Judgment:  Good Impulse Control: Good  Risk  Assessment: Danger to Self: No Self-injurious Behavior: No Danger to Others: No Duty to Warn:no Physical Aggression / Violence:No  Access to Firearms a co ncern: No  Gang Involvement:No  Treatment plan: Will employ cognitive behavioral therapy as well as grief and loss therapy for relief of anxiety and grief.  Goals of grief therapy or to have a healthier response to the loss of his daughter and less sadness as evidenced by patient report in therapy notes as well as to help him feel less overwhelmed by her loss.  Goals are to see a 50% reduction in grief symptoms over the next 6 months.   I will encourage the use of the patient telling his grief story about his daughter including encouraging him to bring pictures and memorabilia to help process his feelings including any feelings of guilt associated with her loss.  We will use cognitive behavioral therapy to help identify and change anxiety producing thoughts and behavior patterns so as to improve his ability to better manage anxiety and stress, and manage thoughts and  worrisome thinking contributing to anxiety.  We will also refresh and encourage dialectical behavior therapy  distress tolerance and mindfulness skills with the intention of reducing anxiety by 50% over the next 6 months. Progress: 30% Interventions: Cognitive Behavioral Therapy  Diagnosis:PTSD, Bi-Polar D/O  Plan: F/U/ appointments scheduled weekly.  Lorrene CHRISTELLA Hasten, [facility]                                                                                                                    Lorrene CHRISTELLA Hasten, [facility]               Lorrene CHRISTELLA Hasten, [facility]               Lorrene CHRISTELLA Hasten, [facility]  [place] [facility] Counselor/Therapist Progress Note  Patient ID: Ngoc Daughtridge, MRN: [id number],    Date: 04/01/2024 This session was held via video teletherapy. The patient consented to the video teletherapy and was located in his home during this se ssion. He is aware it is the responsibility of the patient to secure confidentiality on his end of the session. The provider was in a private home office for the duration of this session.   The patient arrived on time for her Caregility session  Time Spent: 57 minutes, 2 PM to 2:57 PM  Treatment Type: Individual Therapy Reported Symptoms: anxiety, panic attacks The patient is still having significant back pain.  He cannot sit a nywhere for very long and has to be careful where he sits.  There is still some unsteadiness although he did not report any falls over the past 2 weeks.  His biggest concern now is for  his wife.  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  She is meeting with her cardiologist again next week to see what possible treatment is for her.  He still thinks there  may be some issue with her diaphragm but she is meeting with the pulmonologist also.  He is doing what he can to help her.  His daughter's birthday is March 11 so he knows that is a difficult time for both he and his wife next week.  Typically they do something to remember her but because of the wife's current health issues they cannot go to [place] as they had planned so they are talking about what to do to remember and celebra te her birthday.  We will continue to work on coping skills for helping with his anxiety and the progression of his Lewy body dementia diagnosis as well as processing his grief. Mental Status Exam: Appearance:  Well Groomed  Behavior: Appropriate Motor: Pt. Reports some instability due to Lewey Body dementia Speech/Language:  Normal Rate Affect: Appropriate Mood: normal Thought process: normal Thought content:  WNL Sen sory/Perceptual disturbances:  WNL Orientation: oriented to person, place, and situation Attention: Good Concentration: Good Memory: Impaired short term Fund of knowledge:  Good Insight:  Good Judgment:  Good Impulse Control: Good  Risk Assessment: Danger to Self: No Self-injurious Behavior: No Danger to Others: No Duty to Warn:no Physical Aggression / Violence:No  Access to Firearms a concern: No  Gang Involvemen t:No  Treatment plan: Will employ cognitive behavioral therapy as well as grief and loss therapy for relief of anxiety and grief.  Goals of grief therapy or to have a healthier response to the loss of his daughter and less sadness as evidenced by patient report in therapy notes as well as to help him feel less overwhelmed by her loss.  Goals are to see a 50%  reduction in grief symptoms over the next 6 months.  I will encourage the use  of the patient telling his grief story about his daughter including encouraging him to bring pictures and memorabilia to help process his feelings including any feelings of guilt associated with her loss.  We will use cognitive behavioral therapy to help identify and change anxiety producing thoughts and behavior patterns so as to improve his ability to better manage anxiety and stress, and manage thoughts and worrisome thinking contrib uting to anxiety.  We will also refresh and encourage dialectical behavior therapy distress tolerance and mindfulness  skills with the intention of reducing anxiety by 50% over the next 6 months. Progress: 30% Interventions: Cognitive Behavioral Therapy  Diagnosis:PTSD, Bi-Polar D/O  Plan: F/U/ appointments scheduled weekly.  Lorrene CHRISTELLA Hasten, [facility]                                                                                                                    Lorrene CHRISTELLA Hasten, [facility]  [facility] Counselor/Therapist Progress Note  Patient ID: Marqual Mi, MRN: [id number],    Date: 04/01/2024 This session was held via video teletherapy. The patient consented to the video teletherapy and was located in his home during this session. He is aware it is the responsibility of the patient to secure confidentiality on his end of the session. The pro vider was in a private home office for the duration of this session.   The patient arrived on time for her Caregility session  Time Spent: 57 minutes, 2 PM to 2:57 PM  Treatment Type: Individual Therapy Reported Symptoms: anxiety, panic attacks That was a situation earlier in the week when the patient's neighbor was banging on his back door and window.  There had been situations where the same neighbor had said things and use p rofanity directed at his wife feeling that the patient and his wife and let their dog use the bathroom in his yard.  The patient said they are very careful because her dog is old and did not think that happen but did not appreciate how they were handled things.  He said he got angry very quickly and did not handle it the way that he wanted to.  The situation has been resolved but the patient has had very clear boundaries with his neighb or. His wife continues to progress well from her surgery.  Physically the patient is doing fairly well.  He had to see one of his brothers this week.  He is spending time with his son  and son's family.  He has gotten some retirement/insurance things works out for the most part and is glad to have that settled. We will continue to work on coping skills for helping with his anxiety and the progression of his Lewy body dementia diagnosis  as well as processing his grief. Mental Status Exam: Appearance:  Well Groomed  Behavior: Appropriate Motor: Pt. Reports some instability due to Lewey Body dementia Speech/Language:  Normal Rate Affect: Appropriate Mood: normal Thought process: normal Thought content:  WNL Sensory/Perceptual disturbances:  WNL Orientation: oriented to person, place, and situation Attention: Good Concentration: Good Memory: Impaire d short term Fund of knowledge:  Good Insight:  Good Judgment:  Good Impulse Control: Good  Risk Assessment: Danger to Self: No Self-injurious Behavior: No Danger to Others: No Duty to Warn:no Physical Aggression / Violence:No  Access to Firearms a concern: No  Gang Involvement:No  Treatment plan: Will employ cognitive behavioral therapy as well as grief and loss therapy for relief of anxiety and grief.  Goals of grief  therapy or to have a healthier response to the loss of his daughter and less sadness as evidenced by patient report in therapy notes as well as to help him feel less overwhelmed by her loss.  Goals are to see a 50% reduction in grief symptoms over the next 6 months.  I will encourage the use of the patient telling his grief story about his daughter including encouraging him to bring pictures and memorabilia to help process his feelings  including any feelings of guilt associated with her loss.  We will use cognitive behavioral therapy to help identify and change anxiety producing thoughts and behavior patterns so as to improve his ability to better manage anxiety and stress, and manage thoughts and worrisome thinking contributing to anxiety.  We will also refresh and encourage dialectical behavior  therapy distress tolerance and mindfulness skills with the intention of  reducing anxiety by 50% over  the next 6 months. Progress: 30% Interventions: Cognitive Behavioral Therapy  Diagnosis:PTSD, Bi-Polar D/O  Plan: F/U/ appointments scheduled weekly.  Lorrene CHRISTELLA Hasten, [facility]                                                                                                                    Lorrene CHRISTELLA Hasten, [facility]               Lorrene CHRISTELLA Hasten, [facility]               Lorrene CHRISTELLA Hasten, [facility]               Lorrene CHRISTELLA Hasten, [facility]               Lorrene CHRISTELLA Hasten, [facility]               Lorrene CHRISTELLA Hasten, [facility]               Lorrene CHRISTELLA Hasten, [facility]               Lorrene CHRISTELLA Hasten, [facility]               Lorrene CHRISTELLA Hasten, [facility]                Lorrene CHRISTELLA Hasten, [facility]  [facility] Counselor/Therapist Progress Note  Patient  ID: Hobie Kohles, MRN: [id number],    Date: 04/01/2024 This session was held via video teletherapy. The patient consented to the video teletherapy and was located in his home during this session. He is aware it is the responsibility of the patient to secure confidentiality on his end of the session. The provider was in a private home office for the duration of this session.   The patient arrived on time for her Caregility ses sion  Time Spent: 59 minutes, 2 PM to 2:59 PM  Treatment Type: Individual Therapy Reported Symptoms: anxiety, panic attacks The patient said he jokingly said to his wife that he had not had any pain issues in the cervical area and then a few days ago he started having pain in his neck and shoulders.  He now sits related to degenerative disk and has some consistent pain somewhere in his spine with that but jokingly says it just depe nds on Wednesday which part of his body is decides to show up and hurt.  He is doing the best that he can.  He is still walking which he knows is good for him but does not have right now the strength to do as much in the morning as he has done in the past so he is denying that to 3 times a day for consistency.  He is still doing some positive things and that he is staying in touch with his brother and other family members.  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  One of them has had a similar experience in that she lost her son in the same way the patient lost his daughter.   He said they did not think and Easter in which they set a place for his daughter at the table and put her picture there.  He initially had reservations about doing that he and his wife had made everyone feel as if she was really there with him.  He still  feels that he is grieving in a healthy way.  He and his wife have always taken a trip around both of their birthdays which are 2 days apart.  That is now close to his daughter died so they have started taking some of her rashes with him and sprinkling them where they go as a way of remembrance.  They are deciding what that looks like this year in July.  He does contract for safety having no thoughts of hurting himself or anyone else.  We will continue to work on coping skills for helping with his anxiety and the progression of his Lewy body dementia diagnosis as well as processing his grief. Mental Status Exam: Appearance:  Well Groomed  Behavior: Appropriate Motor: Pt. Reports some instability due to Lewey Body dementia Speech/Language:  Normal Rate Affect: Appropriate Mood: normal Thought process: normal Thought content:  WNL Sensory/Perceptual dis turbances:  WNL Orientation: oriented to person, place, and situation Attention: Good Concentration: Good Memory: Impaired short term Fund of knowledge:  Good Insight:  Good Judgment:  Good Impulse Control: Good  Risk Assessment: Danger to Self: No Self-injurious Behavior: No Danger to Others: No Duty to Warn:no Physical Aggression / Violence:No  Access to Firearms a concern: No  Gang Involvement:No  Treatment pl an: Will employ cognitive behavioral therapy as well as grief and loss therapy for relief of anxiety and grief.  Goals of grief therapy  or to have a healthier response to the loss of his daughter and less sadness as evidenced by patient report in therapy notes as well as to help him feel less overwhelmed by her loss.  Goals are to see a 50% reduction in grief symptoms  over the next 6 months.  I will encourage the use of the patient tell ing his grief story about his daughter including encouraging him to bring pictures and memorabilia to help process his feelings including any feelings of guilt associated with her loss.  We will use cognitive behavioral therapy to help identify and change anxiety producing thoughts and behavior patterns so as to improve his ability to better manage anxiety and stress, and manage thoughts and worrisome thinking contributing to anxiety.   We will also refresh and encourage dialectical behavior therapy distress tolerance and mindfulness skills with the intention of reducing anxiety by 50% over the next 6 months. Progress: 30% Interventions: Cognitive Behavioral Therapy  Diagnosis:PTSD, Bi-Polar D/O  Plan: F/U/ appointments scheduled weekly.  Lorrene CHRISTELLA Hasten, [facility]                                                                                                                    Lorrene CHRISTELLA Hasten, [facility]  [facility] Counselor/Therapist Progress Note  Patient ID: Ankit Degregorio, MRN: [id number],    Date: 04/01/2024 This session was held via video teletherapy. The patient consented to the video teletherapy and was located in his home during this session. He is aware it is the responsibility of the patient to secure confidentiality on his end of the session. The provider was in a priv ate home office for the duration of this session.   The patient arrived on time for her Caregility session  Time Spent: 58 minutes, 2 PM to 2:58 PM  Treatment Type: Individual Therapy Reported Symptoms: anxiety, panic attacks A fairly difficult week because it was his daughter's birthday.  He said this year was more difficult than last year but he could not pinpoint why.  They did remember her about going out to a nice restaura nt which is something her daughter always wanted to do.  He said they were able to laugh and joke about funny things that his daughter had said or done which is not something they were able to do this time last year so he saw that as healthy grieving.  He knows that the time change threw things off a little bit for everybody and his wife still is not feeling well and his back has been bothering him.  He was not able to go to his first r ound of physical therapy this week because his back was bothering him more.  He has been thinking a lot more about his daughter which he feels comfortable with.  He is thankful that it  is getting warmer and staying light longer so he can get back to some of the coping skills such as fishing that he has done in the past.   We will continue to work on coping skills for helping with his anxiety and the progression of his Lewy body demen tia diagnosis as well as processing his grief. 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 Speech/Language:  Normal Rate Affect: Appropriate Mood: normal Thought process: normal Thought content:  WNL Sensory/Perceptual disturbances:  WNL Orientation: oriented to person, place, and situation Attention: Good Concentration: Good Me mory: Impaired short term Fund of knowledge:  Good Insight:  Good Judgment:  Good Impulse Control: Good  Risk Assessment: Danger to Self: No Self-injurious Behavior: No Danger to Others: No Duty to Warn:no Physical Aggression / Violence:No  Access to Firearms a concern: No  Gang Involvement:No  Treatment plan: Will employ cognitive behavioral therapy as well as grief and loss therapy for relief of anxiety and grief.  G oals of grief therapy or to have a healthier response to the loss of his daughter and less sadness as evidenced by patient report in therapy notes as well as to help him feel less overwhelmed by her loss.  Goals are to see a 50% reduction in grief symptoms over the next 6 months.  I will encourage the use of the patient telling his grief story about his daughter including encouraging him to bring pictures and memorabilia to help process  his feelings including any feelings of guilt associated with her loss.  We will use cognitive behavioral therapy to help identify and change anxiety producing thoughts and behavior patterns so as to improve his ability to better manage anxiety and stress, and manage thoughts and worrisome thinking contributing to anxiety.  We will also refresh and encourage  dialectical behavior therapy distress  tolerance and mindfulness skills with the  intention of reducing anxiety by 50% over the next 6 months. Progress: 30% Interventions: Cognitive Behavioral Therapy  Diagnosis:PTSD, Bi-Polar D/O  Plan: F/U/ appointments scheduled weekly.  Lorrene CHRISTELLA Hasten, [facility]                                                                                                                    Lorrene CHRISTELLA Hasten, [facility]               Lorrene CHRISTELLA Hasten, [facility]               Lorrene CHRISTELLA Hasten, [facility]  [place] [facility] Counselor/Therapist Progress Note  Patient ID: Ivie Maese, MRN:  [id number],    Date: 04/01/2024 This session was held via video teletherapy. The patient consented to the video teletherapy and was located in his home during this session. He is aware it is the responsibility of the patient to secure confidentiality on his end of the session. The provider was in a private home office for the duration o f this session.   The patient arrived on time for her Caregility session  Time Spent: 1:05 PM until 2 PM, 55 minutes Treatment Type: Individual Therapy Reported Symptoms: anxiety, panic attacks The patient said he came in from his walk this morning and saw what he thought was some sort of tic from his wife.  It was not the first time it happened and he wonders if it might be tardive dyskinesia because of some new medications th at she is on.  She has a call into the doctor right now and hopes to hear something back soon.  They have narrowed down to the fact they think most of her discomfort in breathing is coming from the diaphragm and they are looking for alternatives for treatment.  The patient continues to do those things which help him cope including walking, fishing when he can, spending time with his son and daughter-in-law and grandson.  He also has a  way of continued coping with grief he is writing letters to his daughters as a way of expressing his feelings and says that is very beneficial.  He appears to be grieving in a healthy way but he knows that he to year anniversary of her death is coming up and there is some grief there but says it is not overwhelming.  We will continue to work on coping skills for helping with his anxiety and the progression of his Lewy body dementia d iagnosis as well as processing his grief. 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 Speech/Language:  Normal Rate Affect: Appropriate Mood: normal Thought process: normal Thought  content:  WNL Sensory/Perceptual disturbances:  WNL Orientation: oriented to person, place, and situation Attention: Good Concentration: Good Memory:  Impaired short term Fund of knowledge:  Good Insight:  Good Judgment:  Good Impulse Control: Good  Risk Assessment: Danger to Self: No Self-injurious Behavior: No Danger to Others: No Duty to Warn:no Physical Aggression / Violence:No  Access to Firearms a concern: No  Gang Involvement:No  Treatment plan: Will employ cognitive behavioral therapy as well as grief and loss therapy for relief of anxiety and grief.  Goals  of grief therapy or to have a healthier response to the loss of his daughter and less sadness as evidenced by patient report in therapy notes as well as to help him feel less overwhelmed by her loss.  Goals are to see a 50% reduction in grief symptoms over the next 6 months.  I will encourage the use of the patient telling his grief story about his daughter including encouraging him to bring pictures and memorabilia to help process his  feelings including any feelings of guilt associated with her loss.  We will use cognitive behavioral therapy to help identify and change anxiety producing thoughts and behavior patterns so as to improve his ability to better manage anxiety and stress, and manage thoughts and worrisome thinking contributing to anxiety.  We will also refresh and encourage dialectical behavior therapy distress tolerance and  mindfulness skills with the inte ntion of reducing anxiety by 50% over the next 6 months. Progress: 30% Interventions: Cognitive Behavioral Therapy  Diagnosis:PTSD, Bi-Polar D/O  Plan: F/U/ appointments scheduled weekly.  Lorrene CHRISTELLA Hasten, [facility]                                                                                                                    Lorrene CHRISTELLA Hasten,  [facility]  [place] [facility] Counselor/Therapist Progress Note  Patient ID: Tiler Brandis, MRN: [id number],    Date: 04/01/2024 This session was held via video teletherapy. The patient consented to the video teletherapy and was located in his home during this session. He is aware it is the responsibility of the patient to secure confidentiality on his end of the session. The provider was in a private home office for the duration of this session.   The patient arrived on time for her Caregility session  Time  Spent: 59 minutes, 2 PM to 2:59 PM  Treatment Type: Individual Therapy Reported Symptoms: anxiety, panic attacks The patient continues to present with physical pain and both his back and his knee.  He  started with a new physical therapist and told him that there was a lot of arthritis in both knees and or certain things he cannot do but they had him do that anyway and now for the past 3 days he has been in significant knee pain.  He  has continued to walk because he knows that is good for him in various ways but says it has been painful.  His wife also was not feeling well but she is going back to the doctor next week to have an ablation and hopes that will bring her some relief.  For the most part he has been at the last couple weeks reaching back out and hearing from some old friends as well as time with his son and son's family.  He says that his wife's cousin is  coming down for a weekend soon and they enjoy time with her and that he will go to his brother's house sometime in the next few weeks.  He recognizes the need for people that he cares about in helping with his depression. We will continue to work on coping skills for helping with his anxiety and the progression of his Lewy body dementia diagnosis as well as processing his grief. 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 Speech/Language:  Normal Rate Affect: Appropriate Mood: normal Thought process: normal Thought content:  WNL Sensory/Perceptual disturbances:  WNL Orientation: oriented to person, place, and situation Attention: Good Concentration: Good Memory: Impaired short term Fund of knowledge:  Good Insight:  Good Judgment:  Good Impulse Control : Good  Risk Assessment: Danger to Self: No Self-injurious Behavior: No Danger to Others: No Duty to Warn:no Physical Aggression / Violence:No  Access to Firearms a concern: No  Gang Involvement:No  Treatment plan: Will employ cognitive behavioral therapy as well as grief and loss therapy for relief of anxiety and grief.  Goals of grief therapy or to have a healthier  response to the loss of his daughter and less sadness as e videnced by patient report in therapy notes as well as to help him feel less overwhelmed by her loss.  Goals are to see a 50% reduction in grief symptoms over the next 6 months.  I will encourage the use of the patient telling his grief story about his daughter including encouraging him to bring pictures and memorabilia to help process his feelings including any feelings of guilt associated with her loss.  We will use cognitive behavior al therapy to help identify and change anxiety producing thoughts and behavior patterns so as to improve his ability to better manage anxiety and stress, and manage thoughts and worrisome thinking contributing to anxiety.  We will  also refresh and encourage dialectical behavior therapy distress tolerance and mindfulness skills with the intention of reducing anxiety by 50% over the next 6 months. Progress: 30% Interventions: Cognitive  Behavioral Therapy  Diagnosis:PTSD, Bi-Polar D/O  Plan: F/U/ appointments scheduled weekly.  Lorrene CHRISTELLA Hasten, [facility]                                                                                                                   Lorrene CHRISTELLA Hasten, [facility]               Lorrene CHRISTELLA Hasten, [facility]                Lorrene CHRISTELLA Hasten, [facility]               Lorrene CHRISTELLA Hasten, [facility] [facility]  [place] [facility] Counselor/Th erapist Progress Note  Patient ID: Iseah Plouff, MRN: [id number],    Date: 04/01/2024 This session was held via video teletherapy. The patient consented to the video teletherapy and was located in his home during this session. He is aware it is the responsibility of the patient to secure confidentiality on his end of the session. The provider was in a private home office for the duration of this session.   The patient arrive d on time for her Caregility session  Time Spent: 57 minutes, 2 PM to 2:57 PM  Treatment Type: Individual Therapy Reported Symptoms: anxiety, panic attacks The patient is still having significant back pain.  He cannot sit anywhere for very long and has to be careful where he sits.  There is still some unsteadiness although he did not report any falls over the past 2 weeks.  His biggest concern now is for his wife.  She has been hav ing some difficulty breathing and they did a test so they found a small hole on the back side of her heart.  She is meeting with her cardiologist again next week to see what possible  treatment is for her.  He still thinks there may be some issue with her diaphragm but she is meeting with the pulmonologist also.  He is doing what he can to help her.  His daughter's birthday is March 11 so he knows that is a difficult time for both he a nd his wife next week.  Typically they do something to remember her but because of the wife's current health issues they cannot go to [place] as they had planned so they are talking about what to do to remember and celebrate her birthday.  We will continue to work on coping skills for helping with his anxiety and the progression of his Lewy body dementia diagnosis as well as processing his grief. 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 Speech/Language:  Normal Rate Affect: Appropriate Mood: normal Thought process: normal Thought content:  WNL Sensory/Perceptual disturbances:  WNL Orientation: oriented to person, place, and situation Attention: Good Concentration: Good Memory: Impaired short term Fund of knowledge:  Good Insight:  Good Judgment :  Good Impulse Control: Good  Risk Assessment: Danger to Self: No Self-injurious Behavior: No Danger to Others: No Duty to Warn:no Physical Aggression / Violence:No  Access to Firearms a concern: No  Gang Involvement:No  Treatment plan: Will employ cognitive behavioral therapy as well as grief and loss therapy for relief of anxiety and grief.  Goals of grief therapy or to have a healthier response to the loss of his daught er and less sadness as evidenced by patient report in therapy notes as well as to help him feel less overwhelmed by her loss.  Goals are to see a 50% reduction in grief symptoms over the next 6 months.  I will encourage the use of the patient telling his grief story about his daughter including encouraging him to bring pictures and memorabilia to help process his  feelings including any feelings of guilt associated with her loss.  We wil l use cognitive behavioral therapy to help identify and change anxiety producing thoughts and behavior patterns so as to improve his ability to better manage anxiety and stress, and manage thoughts and worrisome thinking contributing to anxiety.  We will also refresh and encourage dialectical behavior therapy distress tolerance and mindfulness  skills with the intention of reducing anxiety by 50% over the next 6 months. Progress: 30% I nterventions: Cognitive Behavioral Therapy  Diagnosis:PTSD, Bi-Polar D/O  Plan: F/U/ appointments scheduled weekly.  Lorrene CHRISTELLA Hasten, [facility]                                                                                                                   Lorrene CHRISTELLA Hasten, [facility]  [place] [facility] Counselor/Therapist Progress Note  Patient ID: Stephen Myers, MRN: [id number],    Date: 04/01/2024 This session was held via  video teletherapy. The patient consented to the video teletherapy and was located in his home during this session. He is aware it is the responsibility of the patient to secure confidentiality on his end of the session. The provider was in a private home office for the duration of this session.   The patient arrived on time for her Caregility session  Time Spent: 58 minutes, 2 PM to 2:58 PM  Treatment Type: Individual Therapy R eported Symptoms: anxiety, panic attacks A fairly difficult week because it was his daughter's birthday.  He said this year was more difficult than last year but he could not pinpoint why.  They did remember her about going out to a nice restaurant which is something her daughter always wanted to do.  He said they were able to laugh and joke about funny things that his daughter had said or done which is not something they were able to  do this time last year so he saw that as healthy grieving.  He knows that the time change threw things off a little bit for everybody and his wife still is not feeling well and his back has been bothering him.  He was not able to go to his first round of physical therapy this week because his back was bothering him more.  He has been thinking a lot more about his daughter which he feels comfortable with.  He is thankful that it is getti ng warmer and staying light longer so he can get back to some of the coping skills such as fishing that he has done in the past.   We will continue to work on coping skills for  helping with his anxiety and the progression of his Lewy body dementia diagnosis as well as processing his grief. 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 Speec h/Language:  Normal Rate Affect: Appropriate Mood: normal Thought process: normal Thought content:  WNL Sensory/Perceptual disturbances:  WNL Orientation: oriented to person, place, and situation Attention: Good Concentration: Good Memory: Impaired short term Fund of knowledge:  Good Insight:  Good Judgment:  Good Impulse Control: Good  Risk Assessment: Danger to Self: No Self-injurious Behavior: No Danger to O thers: No Duty to Warn:no Physical Aggression / Violence:No  Access to Firearms a concern: No  Gang Involvement:No  Treatment plan: Will employ cognitive behavioral therapy as well as grief and loss therapy for relief of anxiety and grief.  Goals of grief therapy or to have a healthier response to the loss of his daughter and less sadness as evidenced by patient report in therapy notes as well as to help him feel less overwhelmed b y her loss.  Goals are to see a 50% reduction in grief symptoms over the next 6 months.  I will encourage the use of the patient telling his grief story about his daughter including encouraging him to bring pictures and memorabilia to help process his feelings including any feelings of guilt associated with her loss.  We will use cognitive behavioral therapy to help identify and change anxiety producing thoughts and behavior patterns so  as to improve his ability to better manage anxiety and stress, and manage thoughts and worrisome thinking contributing to anxiety.  We will also refresh and encourage dialectical behavior therapy distress  tolerance and mindfulness skills with the intention of reducing anxiety by 50% over the next 6 months. Progress: 30% Interventions: Cognitive Behavioral  Therapy  Diagnosis:PTSD, Bi-Polar D/O  Plan: F/U/ appointments scheduled we ekly.  Lorrene CHRISTELLA Hasten, [facility]                                                                                                                   Lorrene CHRISTELLA Hasten, [facility]               Lorrene CHRISTELLA Hasten, [facility]               Lorrene CHRISTELLA Hasten, [facility]  [place] [facility] Counselor/Therapist Progress Note  Patient ID: Serenity Fortner, MRN: [id number],    Date: 04/01/2024 This session was held via video teletherapy. Th e patient consented to the video teletherapy and was located in his home during this session. He is aware  it is the responsibility of the patient to secure confidentiality on his end of the session. The provider was in a private home office for the duration of this session.   The patient arrived on time for her Caregility session  Time Spent: 57 minutes, 2 PM to 2:57 PM  Treatment Type: Individual Therapy Reported Symptoms: anxi ety, panic attacks The patient is still having significant back pain.  He cannot sit anywhere for very long and has to be careful where he sits.  There is still some unsteadiness although he did not report any falls over the past 2 weeks.  His biggest concern now is for his wife.  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  She is meeting with her cardiolo gist again next week to see what possible treatment is for her.  He still thinks there may be some issue with her diaphragm but she is meeting with the pulmonologist also.  He is doing what he can to help her.  His daughter's birthday is March 11 so he knows that is a difficult time for both he and his wife next week.  Typically they do something to remember her but because of the wife's current health issues they cannot go to [place] as they had planned so they are talking about what to do to remember and celebrate her birthday.  We will continue to work on coping skills for helping with his anxiety and the progression of his Lewy body dementia diagnosis as well as processing his grief. 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 Speech/Language:  Normal Rate A ffect: Appropriate Mood: normal Thought process: normal Thought content:  WNL Sensory/Perceptual disturbances:  WNL Orientation: oriented to person, place, and situation Attention: Good Concentration: Good Memory: Impaired short term Fund of knowledge:  Good Insight:   Good Judgment:  Good Impulse Control: Good  Risk Assessment: Danger to Self: No Self-injurious Behavior: No Danger to Others: No Duty to Warn:no  Physical Aggression / Violence:No  Access to Firearms a concern: No  Gang Involvement:No  Treatment plan: Will employ cognitive behavioral therapy as well as grief and loss therapy for relief of anxiety and grief.  Goals of grief therapy or to have a healthier response to the loss of his daughter and less sadness as evidenced by patient report in therapy notes as well as to help him feel less overwhelmed by her loss.  Goals are to s ee a 50% reduction in grief symptoms over the next 6 months.  I will encourage the use of the patient telling his grief story about his daughter including encouraging him to bring pictures and memorabilia to help process his feelings including any feelings of guilt associated with her loss.  We will use cognitive behavioral therapy to help identify and change anxiety producing thoughts and behavior patterns so as to improve his ability  to better manage anxiety and stress, and manage thoughts and worrisome thinking contributing to anxiety.  We will also refresh and encourage dialectical behavior therapy distress tolerance and mindfulness  skills with the intention of reducing anxiety by 50% over the next 6 months. Progress: 30% Interventions: Cognitive Behavioral Therapy  Diagnosis:PTSD, Bi-Polar D/O  Plan: F/U/ appointments scheduled weekly.  Lorrene CHRISTELLA Hasten, [facility]                                                                                                                   Lorrene CHRISTELLA Hasten, [facility]  [place] [facility] Counselor/Therapist Progress Note  Patient ID: Treshon Stannard, MRN: [id number],    Date: 04/01/2024 This session was held via video teletherapy. The patient consented to the video teletherapy and was located in his home during this session. He is aware it is the res ponsibility of the patient to secure confidentiality on his end of the session. The provider was in a private home office for the duration of this session.   The patient arrived on time for her Caregility session  Time Spent: 57 minutes, 2 PM to 2:57 PM  Treatment Type: Individual Therapy Reported Symptoms: anxiety, panic attacks  We will continue to work on coping skills for helping with his anxiety and the progression of his  Lewy body dementia diagnosis as well as processing his grief. Mental Status Exam: Appearance:  Well Groomed  Behavior: Appropriate Motor: Pt. Reports some instability due to  Lewey Body dementia Speech/Language:  Normal Rate Affect: Appropriate Mood: normal Thought process: normal Thought content:  WNL Sensory/Perceptual disturbances:  WNL Orientation: oriented to person, place, and situation Attention: Good Concent ration: Good Memory: Impaired short term Fund of knowledge:  Good Insight:  Good Judgment:  Good Impulse Control: Good  Risk Assessment: Danger to Self: No Self-injurious Behavior: No Danger to Others: No Duty to Warn:no Physical Aggression / Violence:No  Access to Firearms a concern: No  Gang Involvement:No  Treatment plan: Will employ cognitive behavioral therapy as well as grief and loss therapy for relief of anxie ty and grief.  Goals of grief therapy or to have a healthier response to the loss of his daughter and less sadness as evidenced by patient report in therapy notes as well as to help him feel less overwhelmed by her loss.  Goals are to see a 50% reduction in grief symptoms over the next 6 months.  I will encourage the use of the patient telling his grief story about his daughter including encouraging him to bring pictures and memorabilia  to help process his feelings including any feelings of guilt associated with her loss.  We will use cognitive behavioral therapy to help identify and change anxiety producing thoughts and behavior patterns so as to improve his ability to better manage anxiety and stress, and manage thoughts and worrisome thinking contributing to anxiety.  We will also refresh and encourage dialectical behavior therapy distress tolerance and mindfulness  skills with the intention of reducing anxiety by 50% over the next 6 months. Progress: 30% Interventions: Cognitive Behavioral Therapy  Diagnosis:PTSD, Bi-Polar D/O  Plan: F/U/ appointments scheduled weekly.  Lorrene CHRISTELLA Hasten,  [facility]                                                                                                                    Lorrene CHRISTELLA Hasten, [facility]               Lorrene CHRISTELLA Hasten, [facility]               Lorrene CHRISTELLA Hasten, [facility]  Lorrene CHRISTELLA Hasten, [facility]               Lorrene CHRISTELLA Hasten, [facility]               Lorrene CHRISTELLA Hasten, [facility]               Lorrene CHRISTELLA Hasten, [facility]  [place] [facility] Counselor/Therapist Progress Note  Patient ID: Frantz Quattrone, MRN: [id number],    Date: 04/01/2024 This session was held via video teletherapy. The  patient consented to the  video teletherapy and was located in his home during this session. He is aware it is the responsibility of the patient to secure confidentiality on his end of the session. The provider was in a private home office for the duration of this session.   The patient arrived on time for her Caregility session  Time Spent: 39 minutes, 2 PM to 2:39 PM  Treatment Type: Individual Therapy Reported Symptoms: anxiety, panic attacks The pat ient was not feeling well today.  He started feeling really earlier in the week having a fever bad headaches body aches.  He felt a little better yesterday but feels a little bit of a resurgence in headaches and body aches and is running a low-grade fever.  His wife is feeling some of the same symptoms.  For the most part he says he is doing well.  They did increase his memory medication because they were starting to see some increasing  decline in short-term memory and he said until recently his long-term memory had been good but he was starting to see a difference in how he used to markers to remember what happened at certain times historically.  He feels the medication has helped and has brightened his mood a little also.  He is still trying to walk every day and fish when he can and has enjoyed watching the [employer] as a good distraction.  He reports  that he feels he is managing mood fairly well.  He has an appointment in 2 weeks.  He does contract for safety having no thoughts of hurting himself or anyone else. We will continue to work on coping skills for helping with his anxiety and the progression of his Lewy body dementia diagnosis as well as processing his grief. Mental Status Exam: Appearance:  Well Groomed  Behavior: Appropriate Motor: Pt. Reports some instabilit y due to [employer] Body dementia Speech/Language:  Normal Rate Affect: Appropriate Mood: normal Thought process: normal Thought content:   WNL Sensory/Perceptual disturbances:  WNL Orientation: oriented to person, place, and situation Attention: Good Concentration: Good Memory: Impaired short term Fund of knowledge:  Good Insight:  Good Judgment:  Good Impulse Control: Good  Risk Assessment: Danger to Self: No Self- injurious Behavior: No Danger to Others: No Duty to Warn:no Physical Aggression / Violence:No  Access to Firearms a concern: No  Gang Involvement:No  Treatment plan: Will employ cognitive behavioral therapy as well as grief and loss therapy for relief of anxiety and grief.  Goals of grief therapy or to have a healthier response to the loss of his daughter and less sadness as evidenced by patient report in therapy notes as well as  to help him feel less overwhelmed by her loss.  Goals are to see a 50% reduction in grief symptoms over the next 6 months.  I will encourage the use of the patient telling his grief story about his daughter including encouraging him to bring pictures and memorabilia to help process his feelings including any feelings of guilt associated with her loss.  We will use cognitive behavioral therapy to help identify and change anxiety producin g thoughts and behavior patterns so as to improve his ability to better manage anxiety and stress, and manage thoughts and worrisome thinking contributing to anxiety.  We will also refresh  and encourage dialectical behavior therapy distress tolerance and mindfulness skills with the intention of reducing anxiety by 50% over the next 6 months. Progress: 30% Interventions: Cognitive Behavioral Therapy  Diagnosis:PTSD, Bi-Polar D/O  P lan: F/U/ appointments scheduled weekly.  Lorrene CHRISTELLA Hasten, [facility]                                                                                                                   Lorrene CHRISTELLA Hasten,  [facility]  [place] [facility] Counselor/Therapist Progress Note  Patient ID: Foch Rosenwald, MRN: [id number],    Date: 04/01/2024 This session was held via video teletherapy. The patient consented to the video teletherapy and was loca ted in his home during this session. He is aware it is the responsibility of the patient to secure confidentiality on his end of the session. The provider was in a private home office for the duration of this session.   The patient arrived on time for her Caregility session  Time Spent: 58 minutes, 2 PM to 2:58 PM  Treatment Type: Individual Therapy Reported Symptoms: anxiety, panic attacks A fairly difficult week because it wa s his daughter's birthday.  He said this year was more  difficult than last year but he could not pinpoint why.  They did remember her about going out to a nice restaurant which is something her daughter always wanted to do.  He said they were able to laugh and joke about funny things that his daughter had said or done which is not something they were able to do this time last year so he saw that as healthy grieving.  He knows that the t ime change threw things off a little bit for everybody and his wife still is not feeling well and his back has been bothering him.  He was not able to go to his first round of physical therapy this week because his back was bothering him more.  He has been thinking a lot more about his daughter which he feels comfortable with.  He is thankful that it is getting warmer and staying light longer so he can get back to some of the coping ski lls such as fishing that he has done in the past.   We will continue to work on coping skills for helping with his anxiety and the progression of his Lewy body dementia diagnosis as well as processing his grief. Mental Status Exam: Appearance:  Well Groomed  Behavior: Appropriate Motor: Pt. Reports some instability due to Lewey Body dementia Speech/Language:  Normal Rate Affect: Appropriate Mood: normal Thought process: n ormal Thought content:  WNL Sensory/Perceptual disturbances:  WNL Orientation: oriented to person, place, and situation Attention: Good Concentration: Good Memory: Impaired short term Fund of knowledge:  Good Insight:  Good Judgment:  Good Impulse Control: Good  Risk Assessment: Danger to Self: No Self-injurious Behavior: No Danger to Others: No Duty to Warn:no Physical Aggression / Violence:No  Access to [place] rms a concern: No  Gang Involvement:No  Treatment plan: Will employ cognitive behavioral therapy as well as grief and loss therapy for relief of anxiety and grief.  Goals of grief therapy or to have a healthier response to the loss of his  daughter and less sadness as evidenced by patient report in therapy notes as well as to help him feel less overwhelmed by her loss.  Goals are to see a 50% reduction in grief symptoms over the next 6  months.  I will encourage the use of the patient telling his grief story about his daughter including encouraging him to bring pictures and memorabilia to help process his feelings including any feelings of guilt associated with her loss.  We will use cognitive behavioral therapy to help identify and change anxiety producing thoughts and behavior patterns so as to improve his ability to better manage anxiety and stress, and manage thou ghts and worrisome thinking contributing to anxiety.  We will also refresh and encourage dialectical behavior therapy  distress tolerance and mindfulness skills with the intention of reducing anxiety by 50% over the next 6 months. Progress: 30% Interventions: Cognitive Behavioral Therapy  Diagnosis:PTSD, Bi-Polar D/O  Plan: F/U/ appointments scheduled weekly.  Lorrene CHRISTELLA Hasten, [facility]                                                                                                                    Lorrene CHRISTELLA Hasten, [facility]               Lorrene CHRISTELLA Hasten, [facility]               Lorrene CHRISTELLA Hasten, [facility]  [place] [facility] Counselor/Therapist Progress Note  Patient ID: La Dibella, MRN: [id number],    Date: 04/01/2024 This session was held via video teletherapy. The patient consented to the video teletherapy and was located in his home during this sessio n. He is aware it is the responsibility of the patient to secure confidentiality on his end of the session. The provider was in a private home office for the duration of this session.   The patient arrived on time for her Caregility session  Time Spent: 1:05 PM until 2 PM, 55 minutes Treatment Type: Individual Therapy Reported Symptoms: anxiety, panic attacks The patient said he came in from his walk this morning and saw what h e thought was some sort of tic from his wife.  It was not the first time it happened and he wonders if it might be tardive dyskinesia because of some new medications that she is on.   She has a call into the doctor right now and hopes to hear something back soon.  They have narrowed down to the fact they think most of her discomfort in breathing is coming from the diaphragm and they are looking for alternatives for treatment.  The pati ent continues to do those things which help him cope including walking, fishing when he can, spending time with his son and daughter-in-law and grandson.  He also has a way of continued coping with grief he is writing letters to his daughters as a way of expressing his feelings and says that is very beneficial.  He appears to be grieving in a healthy way but he knows that he to year anniversary of her death is coming up and there is som e grief there but says it is not overwhelming.  We will continue to work on coping skills for helping with his anxiety and the progression of his Lewy body dementia diagnosis as well as processing his grief. Mental Status Exam: Appearance:  Well Groomed  Behavior: Appropriate Motor: Pt. Reports some instability due to Lewey Body dementia Speech/Language:  Normal Rate Affect: Appropriate Mood: normal Thought process: normal  Thought content:  WNL Sensory/Perceptual disturbances:  WNL Orientation: oriented to person, place, and situation Attention: Good Concentration: Good Memory: Impaired short term Fund of knowledge:  Good Insight:  Good Judgment:  Good Impulse Control: Good  Risk Assessment: Danger to Self: No Self-injurious Behavior: No Danger to Others: No Duty to Warn:no Physical Aggression / Violence:No  Access to Firearms a  concern: No  Gang Involvement:No  Treatment plan: Will employ cognitive behavioral therapy as well as grief and loss therapy for relief of anxiety and grief.  Goals of grief therapy or to have a healthier response to the loss of his daughter and less sadness as evidenced by patient report in therapy notes as well as to help him feel less overwhelmed by her loss.   Goals are to see a 50% reduction in grief symptoms over the next 6 mont hs.  I will encourage the use of the patient telling his grief story about his daughter including encouraging him to bring pictures and memorabilia to help process his feelings including any feelings of guilt associated with her loss.  We will use cognitive behavioral therapy to help identify and change anxiety producing thoughts and behavior patterns so as to improve his ability to better manage anxiety and stress, and manage thoughts  and worrisome thinking contributing to anxiety.  We will also refresh and encourage dialectical behavior therapy distress tolerance  and mindfulness skills with the intention of reducing anxiety by 50% over the next 6 months. Progress: 30% Interventions: Cognitive Behavioral Therapy  Diagnosis:PTSD, Bi-Polar D/O  Plan: F/U/ appointments scheduled weekly.  Lorrene CHRISTELLA Hasten, [facility]                                                                                                                    Lorrene CHRISTELLA Hasten, [facility]  [place] [facility] Counselor/Therapist Progress Note  Patient ID: Jayvan Mcshan, MRN: [id number],    Date: 04/01/2024 This session was held via video teletherapy. The patient consented to the video teletherapy and was located in his home during this session. He is aware it is the responsibility of the patient to secure confidentiality on h is end of the session. The provider was in a private home office for the duration of this session.   The patient arrived on time for her Caregility session  Time Spent: 59 minutes, 2 PM to 2:59 PM  Treatment Type: Individual Therapy Reported Symptoms: anxiety, panic attacks The patient continues to present with physical pain and both his back and his knee.  He started with a new physical therapist and told him that there was a  lot of arthritis in both knees and or certain things he cannot do but they had him do that anyway and now for the past 3 days he has been in significant knee pain.  He has continued to walk because he knows that is good for him in various ways but says it has been painful.  His wife also was not feeling well but she is going back to the doctor next week to have an ablation and hopes that will bring her some relief.  For the most part he  has been at the last couple weeks reaching back out and hearing from some old friends as well as time with his son and son's family.  He says that his wife's cousin is coming down for  a weekend soon and they enjoy time with her and that he will go to his brother's house sometime in the next few weeks.  He recognizes the need for people that he cares about in helping with his depression. We will continue to work on coping skills for he lping with his anxiety and the progression of his Lewy body dementia diagnosis as well as processing his grief. Mental Status Exam: Appearance:  Well Groomed  Behavior: Appropriate Motor: Pt. Reports some instability due to Lewey Body dementia Speech/Language:  Normal Rate Affect: Appropriate Mood: normal Thought process: normal Thought content:  WNL Sensory/Perceptual disturbances:  WNL Orientation: oriented to perso n, place, and situation Attention: Good Concentration: Good Memory: Impaired short term Fund of knowledge:  Good Insight:  Good Judgment:  Good Impulse Control: Good  Risk Assessment: Danger to Self: No Self-injurious Behavior: No Danger to Others: No Duty to Warn:no Physical Aggression / Violence:No  Access to Firearms a concern: No  Gang Involvement:No  Treatment plan: Will employ cognitive behavioral therapy as w ell as grief and loss therapy for relief of anxiety and grief.  Goals of grief therapy or to have a healthier response to the loss of his daughter and less sadness as evidenced by patient report in therapy notes as well as to help him feel less overwhelmed by her loss.  Goals are to see a 50% reduction in grief symptoms over the next 6 months.  I will encourage the use of the patient telling his grief story about his daughter including  encouraging him to bring pictures and memorabilia to help process his feelings including any feelings of guilt associated with her loss.  We will use cognitive behavioral therapy to help identify and change anxiety producing thoughts and behavior patterns so as to improve his ability to better manage anxiety and stress, and manage thoughts and worrisome thinking  contributing to anxiety.  We will also  refresh and encourage dialectical be havior therapy distress tolerance and mindfulness skills with the intention of reducing anxiety by 50% over the next 6 months. Progress: 30% Interventions: Cognitive Behavioral Therapy  Diagnosis:PTSD, Bi-Polar D/O  Plan: F/U/ appointments scheduled weekly.  Lorrene CHRISTELLA Hasten, [facility]                                                                                                                    Lorrene CHRISTELLA Hasten, [facility]               Lorrene CHRISTELLA Hasten, [facility]               Lorrene CHRISTELLA Hasten, [facility]               Lorrene CHRISTELLA Hasten, [facility]  [place] [facility] Counselor/Therapist Progress Note  Patient ID: Murrell Dome, MRN: [id number],    Date: 04/01/2024 This session was held via video teletherapy. The patient consented to the video teletherapy and was located in his home during this session. He is aware it is the responsibility  of the patient to secure confidentiality on his end of the session. The provider was in a private home office for the duration of this session.   The patient arrived on time for her Caregility session  Time Spent: 57 minutes, 2 PM to 2:57 PM  Treatment Type: Individual Therapy Reported Symptoms: anxiety, panic attacks The patient is still having significant back pain.  He cannot sit anywhere for very long and has to be careful  where he sits.  There is still some unsteadiness although he did not report any falls over the past 2 weeks.  His biggest concern now is for his wife.  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  She is meeting with her cardiologist again next week to see what possible treatment is for her.  He still thinks there may be some issue with her diaphragm but she  is meeting with the pulmonologist also.  He is doing what he can to help her.  His daughter's birthday is March 11 so he knows that is a difficult time for both he and his wife next week.  Typically they do something to remember her but because of the wife's current health issues they cannot go to [place] as they had planned so they are talking about what to do to remember and celebrate her birthday.  We will continue to work  on coping skills for helping with his anxiety and the progression of his Lewy body dementia diagnosis as well as processing his grief. 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 Speech/Language:  Normal Rate Affect: Appropriate Mood: normal Thought process: normal Thought content:  WNL Sensory/Perceptual disturbances:  WNL Orient ation: oriented to person, place, and situation Attention: Good Concentration: Good Memory: Impaired short term Fund of knowledge:  Good Insight:  Good Judgment:  Good Impulse Control: Good  Risk Assessment: Danger to Self: No Self-injurious Behavior: No Danger to Others: No Duty to Warn:no Physical Aggression / Violence:No  Access to Firearms a concern: No  Gang Involvement:No  Treatment plan: Will employ cognitive  behavioral therapy as well as grief and loss therapy for relief of anxiety and grief.  Goals of grief therapy or to have a healthier response to the loss of his daughter and less sadness as evidenced by patient report in therapy notes as well as to help him feel less overwhelmed by her loss.  Goals are to see a 50% reduction in grief symptoms over the next 6 months.  I will encourage the use of the patient telling his grief story about  his daughter including encouraging him to bring pictures and memorabilia to help process his feelings including any feelings of guilt associated with her loss.  We will use cognitive behavioral therapy to help identify and change anxiety producing thoughts and behavior patterns so as to improve his ability to better manage anxiety and stress, and manage thoughts and worrisome thinking contributing to anxiety.  We will also refresh and  encourage dialectical behavior therapy distress tolerance and mindfulness skills  with the intention of reducing anxiety by 50% over the next 6 months. Progress: 30% Interventions: Cognitive Behavioral Therapy  Diagnosis:PTSD, Bi-Polar D/O  Plan: F/U/ appointments scheduled weekly.  Lorrene CHRISTELLA Hasten,  [facility]                                                                                                                    Lorrene CHRISTELLA Hasten, [facility]  [place] [facility] Counselor/Therapist Progress Note  Patient ID: Maclin Guerrette, MRN: [id number],    Date: 04/01/2024 This session was held via video teletherapy. The patient consented to the video teletherapy and was located in his home during this session. He is aware it is the responsibility of the patient to secure confidentiality on his end of the session. The provider was in a private home office for the d uration of this session.   The patient arrived  on time for her Caregility session  Time Spent: 58 minutes, 2 PM to 2:58 PM  Treatment Type: Individual Therapy Reported Symptoms: anxiety, panic attacks A fairly difficult week because it was his daughter's birthday.  He said this year was more difficult than last year but he could not pinpoint why.  They did remember her about going out to a nice restaurant which is something her  daughter always wanted to do.  He said they were able to laugh and joke about funny things that his daughter had said or done which is not something they were able to do this time last year so he saw that as healthy grieving.  He knows that the time change threw things off a little bit for everybody and his wife still is not feeling well and his back has been bothering him.  He was not able to go to his first round of physical therapy  this week because his back was bothering him more.  He has been thinking a lot more about his daughter which he feels comfortable with.  He is thankful that it is getting warmer and staying light longer so he can get back to some of the coping skills such as fishing that he has done in the past.   We will continue to work on coping skills for helping with his anxiety and the progression of his Lewy body dementia diagnosis as well as  processing his grief. Mental Status Exam: Appearance:  Well Groomed  Behavior: Appropriate Motor: Pt. Reports some instability due to Lewey Body dementia Speech/Language:  Normal Rate Affect: Appropriate Mood: normal Thought process: normal Thought content:  WNL Sensory/Perceptual disturbances:  WNL Orientation: oriented to person, place, and situation Attention: Good Concentration: Good Memory: Impaired short term  Fund of knowledge:  Good Insight:  Good Judgment:  Good Impulse Control: Good  Risk Assessment: Danger to Self: No Self-injurious Behavior: No Danger to Others: No Duty to Warn:no Physical Aggression / Violence:No   Access to Firearms a concern: No  Gang Involvement:No  Treatment plan: Will employ cognitive behavioral therapy as well as grief and loss therapy for relief of anxiety and grief.  Goals of grief therapy or  to have a healthier response to the loss of his daughter and less sadness as evidenced by patient report in therapy notes as well as to help him feel less overwhelmed by her loss.  Goals are to see a 50% reduction in grief symptoms over the next 6 months.  I will encourage the use of the patient telling his grief story about his daughter including encouraging him to bring pictures and memorabilia to help process his feelings including a ny feelings of guilt associated with her loss.  We will use cognitive behavioral therapy to help identify and change anxiety producing thoughts and behavior patterns so as to improve his ability to better manage anxiety and stress, and manage thoughts and worrisome thinking contributing to anxiety.  We will also refresh and encourage dialectical behavior therapy distress  tolerance and mindfulness skills with the intention of reducing an xiety by 50% over the next 6 months. Progress: 30% Interventions: Cognitive Behavioral Therapy  Diagnosis:PTSD, Bi-Polar D/O  Plan: F/U/ appointments scheduled weekly.  Lorrene CHRISTELLA Hasten, [facility]                                                                                                                   [address], [facility]               Lorrene CHRISTELLA Hasten, [facility]               Lorrene CHRISTELLA Hasten,  [facility]  [place] [facility] Counselor/Therapist Progress Note  Patient ID: Phoenyx Paulsen, MRN: [id number],    Date: 04/01/2024 This session was held via video teletherapy. The patient consented to the video teletherapy and was located in his home during this session. He is aware it is the responsibility of the patient to secure confidentiality on his end of the session. The provider was in a private home office for the duration of this sessio n.   The patient arrived on time for her Caregility session  Time Spent: 57 minutes, 2 PM to 2:57 PM  Treatment Type: Individual Therapy Reported Symptoms: anxiety, panic attacks The patient is still having significant back pain.  He cannot sit anywhere for very long and has to be  careful where he sits.  There is still some unsteadiness although he did not report any falls over the past 2 weeks.  His biggest concern now is for  his wife.  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  She is meeting with her cardiologist again next week to see what possible treatment is for her.  He still thinks there may be some issue with her diaphragm but she is meeting with the pulmonologist also.  He is doing what he can to help her.  His daughter's birthday is March 11 so he knows that is a d ifficult time for both he and his wife next week.  Typically they do something to remember her but because of the wife's current health issues they cannot go to [place] as they had planned so they are talking about what to do to remember and celebrate her birthday.  We will continue to work on coping skills for helping with his anxiety and the progression of his Lewy body dementia diagnosis as well as processing his grief. Ment al Status Exam: Appearance:  Well Groomed  Behavior: Appropriate Motor: Pt. Reports some instability due to Lewey Body dementia Speech/Language:  Normal Rate Affect: Appropriate Mood: normal Thought process: normal Thought content:  WNL Sensory/Perceptual disturbances:  WNL Orientation: oriented to person, place, and situation Attention: Good Concentration: Good Memory: Impaired short term Fund of knowledge:  Good  Insight:  Good Judgment:  Good Impulse Control: Good  Risk Assessment: Danger to Self: No Self-injurious Behavior: No Danger to Others: No Duty to Warn:no Physical Aggression / Violence:No  Access to Firearms a concern: No  Gang Involvement:No  Treatment plan: Will employ cognitive behavioral therapy as well as grief and loss therapy for relief of anxiety and grief.  Goals of grief therapy or to have a healthier respons e to the loss of his daughter and  less sadness as evidenced by patient report in therapy notes as well as to help him feel less overwhelmed by her loss.  Goals are to see a 50% reduction in grief symptoms over the next 6 months.  I will encourage the use of the patient telling his grief story about his daughter including encouraging him to bring pictures and memorabilia to help process his feelings including any feelings of guilt associ ated with her loss.  We will use cognitive behavioral therapy to help identify and change anxiety producing thoughts and behavior patterns so as to improve his ability to better manage anxiety and stress, and manage thoughts and worrisome thinking contributing to anxiety.  We will also refresh and encourage dialectical behavior therapy distress tolerance and mindfulness  skills with the intention of reducing anxiety by 50% over the next  6 months. Progress: 30% Interventions: Cognitive Behavioral Therapy  Diagnosis:PTSD, Bi-Polar D/O  Plan: F/U/ appointments scheduled weekly.  Lorrene CHRISTELLA Hasten, [facility]                                                                                                                   Lorrene CHRISTELLA Hasten, [facility]  [place] [facility] Counselor/Therapist Progress Note  Patient ID: Kadin Canipe, MRN: [id number],    Date: 04/01/2024  This session was held via video teletherapy. The patient consented to the video teletherapy and was located in his home during this session. He is aware it is the responsibility of the patient to secure confidentiality on his end of the session. The provider was in a private home office for the duration of this session.   The patient arrived on time for her Caregility session  Time Spent: 57 minutes, 2 PM to 2:57 PM  Treatment  Type: Individual Therapy Reported Symptoms: anxiety, panic attacks  The patient reported that he had not been sleeping well so he did speak with his psychiatrist about adjusting some meds.  Initially they adjusted too much up and he said he felt groggy for the entire next day but they have found a combination now where he feels like he is getting more rested and only feels a little drowsy shortly after waking up..  There have been a  couple times where he stumbled and one of the times he felt down but did not get hurt the other times he was able to catch himself.  He is trying to stay as active as he can walking  daily and fishing with his wife when he can.  His biggest anxiety now is his wife has an upcoming surgery on her diaphragm in 2 weeks so he knows that will be a fairly significant recovery time but also sees the difficulties she is having now and wants her t o get some relief..  His son and daughter-in-law will be supports for them also.  He continues to speak with friends and family members as encouragement.  He also feels that he is grieving appropriately writing to his daughter on a regular basis about the things that are going on in his and his wife's lives.  We will continue to work on coping skills for helping with his anxiety and the progression of his Lewy body dementia diagnosis  as well as processing his grief. Mental Status Exam: Appearance:  Well Groomed  Behavior: Appropriate Motor: Pt. Reports some instability due to Lewey Body dementia Speech/Language:  Normal Rate Affect: Appropriate Mood: normal Thought process: normal Thought content:  WNL Sensory/Perceptual disturbances:  WNL Orientation: oriented to person, place, and situation Attention: Good Concentration: Good Memory: Impaired  short term Fund of knowledge:  Good Insight:  Good Judgment:  Good Impulse Control: Good  Risk Assessment: Danger to Self: No Self-injurious Behavior: No Danger to Others: No Duty to Warn:no Physical Aggression / Violence:No  Access to Firearms a concern: No  Gang Involvement:No  Treatment plan: Will employ cognitive behavioral therapy as well as grief and loss therapy for relief of anxiety and grief.  Goals of grief  therapy or to have a healthier response to the loss of his daughter and less sadness as evidenced by patient report in therapy notes as well as to help him feel less overwhelmed by her loss.  Goals are to see a 50% reduction in grief symptoms over the next 6 months.  I will encourage the use of the patient telling his grief story about his daughter including  encouraging him to bring pictures and memorabilia to help process his feelings  including any feelings of guilt associated with her loss.  We will use cognitive behavioral therapy to help identify and change anxiety producing thoughts and behavior patterns so as to improve his ability  to better manage anxiety and stress, and manage thoughts and worrisome thinking contributing to anxiety.  We will also refresh and encourage dialectical behavior therapy distress tolerance and mindfulness skills with the intention of  reducing anxiety by 50% over the next 6 months. Progress: 30% Interventions: Cognitive Behavioral Therapy  Diagnosis:PTSD, Bi-Polar D/O  Plan: F/U/ appointments scheduled weekly.  Lorrene CHRISTELLA Hasten, [facility]                                                                                                                   C raig CHRISTELLA Hasten, [facility]               Lorrene CHRISTELLA Hasten, [facility]               Lorrene CHRISTELLA Hasten, [facility]               Lorrene CHRISTELLA Hasten, [facility]               Lorrene CHRISTELLA Hasten, [facility]               Lorrene CHRISTELLA Hasten, [facility]               Lorrene CHRISTELLA Hasten, [facility]               Lorrene CHRISTELLA Hasten, [facility]  [place] [facility] Counselor/Therapist Progress Note  Patient ID: Elden Brucato, MRN: [id number],    Date: 04/01/2024 This session was held via video teletherapy. The pati ent consented to the video teletherapy and was located in his home during this session. He is aware it is the responsibility of the patient to secure confidentiality on his end of the session. The provider was in a private home office for the duration of this session.   The patient arrived on time for her Caregility session  Time Spent: 59 minutes, 2 PM to 2:59 PM  Treatment Type: Individual Therapy Reported Symptoms: anxiety, p anic attacks The patient said he jokingly said to his wife that he had not had any pain issues in the cervical area and then a few days ago he started having pain in his neck and shoulders.  He now sits related to degenerative disk and has some consistent pain somewhere in his spine with that but jokingly says it just depends on Wednesday which part of his body is decides to show up and hurt.  He is doing the best that he can.  He is s till walking which he knows is good for him but does not have right now the strength to do as much in the morning as he has done in the past so he is denying that to 3 times a day for  consistency.  He is still doing some positive things and that he is staying in touch with his brother and other family members.  He is reconnecting with friends from high school and he is which she is enjoying.  One of them has had a similar experience in  that she lost her son in the same way the patient lost his daughter.  He said they did not think and Easter in which they set a place for his daughter at the table and put her picture there.  He initially had reservations about doing that he and his wife had made everyone feel as if she was really there with him.  He still feels that he is grieving in a healthy way.  He and his wife have always taken a trip around both of their birthday s which are 2 days apart.  That is now close to his daughter died so they have started taking some of her rashes with him and sprinkling them where they go as a way of remembrance.  They are deciding what that looks like this year in July.  He does contract for safety having no thoughts of hurting himself or anyone else. We will continue to work on coping skills for helping with his anxiety and the progression of his Lewy body dement ia diagnosis as well as processing his grief. Mental Status Exam: Appearance:  Well Groomed  Behavior: Appropriate Motor: Pt. Reports some instability due to Lewey Body dementia Speech/Language:  Normal Rate Affect: Appropriate Mood: normal Thought process: normal Thought content:  WNL Sensory/Perceptual disturbances:  WNL Orientation: oriented to person, place, and situation Attention: Good Concentration: Good Mem ory: Impaired short term Fund of knowledge:  Good Insight:  Good Judgment:  Good Impulse Control: Good  Risk Assessment: Danger to Self: No Self-injurious Behavior: No Danger to Others: No Duty to Warn:no Physical Aggression / Violence:No  Access to Firearms a concern: No  Gang Involvement:No  Treatment plan: Will employ cognitive behavioral therapy  as well as grief and loss therapy for relief of anxiety and grief.  Go als of grief  therapy or to have a healthier response to the loss of his daughter and less sadness as evidenced by patient report in therapy notes as well as to help him feel less overwhelmed by her loss.  Goals are to see a 50% reduction in grief symptoms over the next 6 months.  I will encourage the use of the patient telling his grief story about his daughter including encouraging him to bring pictures and memorabilia to help process  his feelings including any feelings of guilt associated with her loss.  We will use cognitive behavioral therapy to help identify and change anxiety producing thoughts and behavior patterns so as to improve his ability to better manage anxiety and stress, and manage thoughts and worrisome thinking contributing to anxiety.  We will also refresh and encourage dialectical behavior therapy distress tolerance and mindfulness skills with the  intention of reducing anxiety by 50% over the next 6 months. Progress: 30% Interventions: Cognitive Behavioral Therapy  Diagnosis:PTSD, Bi-Polar D/O  Plan: F/U/ appointments scheduled weekly.  Lorrene CHRISTELLA Hasten, [facility]                                                                                                                    Lorrene CHRISTELLA Hasten, [facility]  [place] [facility] Counselor/Therapist Progress Note  Patient ID: Will Brenton Joines, MRN: [id number],    Date: 04/01/2024 This session was held via video teletherapy. The patient consented to the video teletherapy and was located in his home during this session. He is aware it is the responsibility of the patient to secure confidentiality on his end of the session. The provider was in a private home office for the duration of this session.   The patient arrived on time for her Caregility session  T ime Spent: 58 minutes, 2 PM to 2:58 PM  Treatment Type: Individual Therapy Reported Symptoms: anxiety, panic attacks A fairly difficult week because it was his daughter's birthday.  He said this year was more difficult than last year but he could not pinpoint why.  They did remember her about going out to a nice restaurant which is something her daughter always wanted to do.  He said they were able to laugh and joke about funny thing s that his daughter had said or done which is not something they were able to do this time last year so he saw that as healthy grieving.  He knows that the time change threw things off  a little bit for everybody and his wife still is not feeling well and his back has been bothering him.  He was not able to go to his first round of physical therapy this week because his back was bothering him more.  He has been thinking a lot more about  his daughter which he feels comfortable with.  He is thankful that it is getting warmer and staying light longer so he can get back to some of the coping skills such as fishing that he has done in the past.   We will continue to work on coping skills for helping with his anxiety and the progression of his Lewy body dementia diagnosis as well as processing his grief. Mental Status Exam: Appearance:  Well Groomed  Behavior: Appr opriate Motor: Pt. Reports some instability due to Lewey Body dementia Speech/Language:  Normal Rate Affect: Appropriate Mood: normal Thought process: normal Thought content:  WNL Sensory/Perceptual disturbances:  WNL Orientation: oriented to person, place, and situation Attention: Good Concentration: Good Memory: Impaired short term Fund of knowledge:  Good Insight:  Good Judgment:  Good Impulse Control: Good  Ri sk Assessment: Danger to Self: No Self-injurious Behavior: No Danger to Others: No Duty to Warn:no Physical Aggression / Violence:No  Access to Firearms a concern: No  Gang Involvement:No  Treatment plan: Will employ cognitive behavioral therapy as well as grief and loss therapy for relief of anxiety and grief.  Goals of grief therapy or to have a healthier response to the loss of his daughter and less sadness as evidenced by  patient report in therapy notes as well as to help him feel less overwhelmed by her loss.  Goals are to see a 50% reduction in grief symptoms over the next 6 months.  I will encourage the use of the patient telling his grief story about his daughter including encouraging him to bring pictures and memorabilia to help process his feelings including any feelings of guilt  associated with her loss.  We will use cognitive behavioral therapy t o help identify and change anxiety producing thoughts and behavior patterns so as to improve his ability to better manage anxiety and stress, and manage thoughts and worrisome thinking contributing to anxiety.  We will also refresh and encourage dialectical behavior therapy  distress tolerance and mindfulness skills with the intention of reducing anxiety by 50% over the next 6 months. Progress: 30% Interventions: Cognitive Behavioral T herapy  Diagnosis:PTSD, Bi-Polar D/O  Plan: F/U/ appointments scheduled weekly.  Lorrene CHRISTELLA Hasten, [facility]                                                                                                                   Lorrene CHRISTELLA Hasten, [facility]               Lorrene CHRISTELLA Hasten, [facility]                Lorrene CHRISTELLA Hasten, [facility]  [place] [facility] Counselor/Therapist Progress Note  Patient ID: Dequon Schnebly, MRN: [id number],     Date: 04/01/2024 This session was held via video teletherapy. The patient consented to the video teletherapy and was located in his home during this session. He is aware it is the responsibility of the patient to secure confidentiality on his end of the session. The provider was in a private home office for the duration of this session.   The patient arrived on time for her Caregility session  Time Spent: 1:05 PM until 2 PM, 55  minutes Treatment Type: Individual Therapy Reported Symptoms: anxiety, panic attacks The patient said he came in from his walk this morning and saw what he thought was some sort of tic from his wife.  It was not the first time it happened and he wonders if it might be tardive dyskinesia because of some new medications that she is on.  She has a call into the doctor right now and hopes to hear something back soon.  They have narrowed  down to the fact they think most of her discomfort in breathing is coming from the diaphragm and they are looking for alternatives for treatment.  The patient continues to do those things which help him cope including walking, fishing when he can, spending time with his son and daughter-in-law and grandson.  He also has a way of continued coping with grief he is writing letters to his daughters as a way of expressing his feelings and  says that is very beneficial.  He appears to be grieving in a healthy way but he knows that he to year anniversary of her death is coming up and there is some grief there but says it is  not overwhelming.  We will continue to work on coping skills for helping with his anxiety and the progression of his Lewy body dementia diagnosis as well as processing his grief. Mental Status Exam: Appearance:  Well Groomed  Behavior: Appropria te Motor: Pt. Reports some instability due to Lewey Body dementia Speech/Language:  Normal Rate Affect: Appropriate Mood: normal Thought process: normal Thought content:  WNL Sensory/Perceptual disturbances:  WNL Orientation: oriented to person, place, and situation Attention: Good Concentration: Good Memory: Impaired short term Fund of knowledge:  Good Insight:  Good Judgment:  Good Impulse Control: Good  Risk As sessment: Danger to Self: No Self-injurious Behavior: No Danger to Others: No Duty to Warn:no Physical Aggression / Violence:No  Access to Firearms a concern: No  Gang Involvement:No  Treatment plan: Will employ cognitive behavioral therapy as well as grief and loss therapy for relief of anxiety and grief.  Goals of grief therapy or to have a healthier response to the loss of his daughter and less sadness as evidenced by patie nt report in therapy notes as well as to help him feel less overwhelmed by her loss.  Goals are to see a 50% reduction in grief symptoms over the next 6 months.  I will encourage the use of the patient telling his grief story about his daughter including encouraging him to bring pictures and memorabilia to help process his feelings including any feelings of guilt associated with her loss.  We will use cognitive behavioral therapy to hel p identify and change anxiety producing thoughts and behavior patterns so as to improve his ability to better manage anxiety and stress, and manage thoughts and worrisome thinking contributing to anxiety.  We will also refresh and encourage dialectical behavior therapy distress tolerance and  mindfulness skills with the intention of reducing anxiety by 50% over the next  6 months. Progress: 30% Interventions: Cognitive Behavioral Therap y  Diagnosis:PTSD, Bi-Polar D/O  Plan: F/U/ appointments scheduled weekly.  Lorrene CHRISTELLA Hasten, [facility]                                                                                                                   Lorrene CHRISTELLA Hasten, [facility]  [place] [facility] Counselor/Therapist Progress Note  Patient ID: Hager Compston, MRN: [id number],    Date: 04/01/2024 This session was held via video teletherapy. The patient consented  to the video teletherapy and was located in his home during this session. He is aware it is the responsibility of  the patient to secure confidentiality on his end of the session. The provider was in a private home office for the duration of this session.   The patient arrived on time for her Caregility session  Time Spent: 59 minutes, 2 PM to 2:59 PM  Treatment Type: Individual Therapy Reported Symptoms: anxiety, panic attacks  The patient continues to present with physical pain and both his back and his knee.  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  He has continued to walk because he knows that is good for him in various ways but says it has been painful.  His wife  also was not feeling well but she is going back to the doctor next week to have an ablation and hopes that will bring her some relief.  For the most part he has been at the last couple weeks reaching back out and hearing from some old friends as well as time with his son and son's family.  He says that his wife's cousin is coming down for a weekend soon and they enjoy time with her and that he will go to his brother's house sometime in  the next few weeks.  He recognizes the need for people that he cares about in helping with his depression. We will continue to work on coping skills for helping with his anxiety and the progression of his Lewy body dementia diagnosis as well as processing his grief. Mental Status Exam: Appearance:  Well Groomed  Behavior: Appropriate Motor: Pt. Reports some instability due to Lewey Body dementia Speech/Language:  Normal Rate  Affect: Appropriate Mood: normal Thought process: normal Thought content:  WNL Sensory/Perceptual disturbances:  WNL Orientation: oriented to person, place, and situation Attention: Good Concentration: Good Memory: Impaired short term Fund of knowledge:  Good Insight:   Good Judgment:  Good Impulse Control: Good  Risk Assessment: Danger to Self: No Self-injurious Behavior: No Danger to Others: No Duty to Warn:n o Physical Aggression / Violence:No  Access to Firearms a concern: No  Gang Involvement:No  Treatment plan: Will employ cognitive behavioral therapy as well as grief and loss therapy for relief of anxiety and grief.  Goals of grief therapy or to have a healthier response to the loss of his daughter and less sadness as evidenced by patient report in therapy notes as well as to help him feel less overwhelmed by her loss.  Goals are to  see a 50% reduction in grief symptoms over the next 6 months.  I will encourage the use of the patient telling his grief story about his daughter including encouraging him to bring pictures and memorabilia to help process his feelings including any feelings of guilt associated with her loss.  We will use cognitive behavioral therapy to help identify and change anxiety producing thoughts and behavior patterns so as to improve his abilit y to better manage anxiety and stress, and manage thoughts and worrisome thinking contributing to anxiety.  We will  also refresh and encourage dialectical behavior therapy distress tolerance and mindfulness skills with the intention of reducing anxiety by 50% over the next 6 months. Progress: 30% Interventions: Cognitive Behavioral Therapy  Diagnosis:PTSD, Bi-Polar D/O  Plan: F/U/ appointments scheduled weekly.  Lorrene CHRISTELLA Hasten,  [facility]                                                                                                                   Lorrene CHRISTELLA Hasten, [facility]               Lorrene CHRISTELLA Hasten, [facility]               Lorrene CHRISTELLA Hasten, [facility]               Lorrene CHRISTELLA Hasten, [facility]  [place] [facility] Counselor/Therapist Progress Note  Patient ID: Keyontae Huckeby, MRN: [id number],    Date: 04/01/2024 This session was held  via video teletherapy. The patient consented to the video teletherapy and was located in his home during this session. He is aware it is the responsibility of the patient to secure confidentiality on his end of the session. The provider was in a private home office for the duration of this session.   The patient arrived on time for her Caregility session  Time Spent: 57 minutes, 2 PM to 2:57 PM  Treatment Type: Individual Therap y Reported Symptoms: anxiety, panic attacks The patient is still having significant back pain.  He cannot sit anywhere for very long and has to be careful where he sits.  There is  still some unsteadiness although he did not report any falls over the past 2 weeks.  His biggest concern now is for his wife.  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  She is  meeting with her cardiologist again next week to see what possible treatment is for her.  He still thinks there may be some issue with her diaphragm but she is meeting with the pulmonologist also.  He is doing what he can to help her.  His daughter's birthday is March 11 so he knows that is a difficult time for both he and his wife next week.  Typically they do something to remember her but because of the wife's current health issues  they cannot go to [place] as they had planned so they are talking about what to do to remember and celebrate her birthday.  We will continue to work on coping skills for helping with his anxiety and the progression of his Lewy body dementia diagnosis as well as processing his grief. Mental Status Exam: Appearance:  Well Groomed  Behavior: Appropriate Motor: Pt. Reports some instability due to Lewey Body dementia Speech /Language:  Normal Rate Affect: Appropriate Mood: normal Thought process: normal Thought content:  WNL Sensory/Perceptual disturbances:  WNL Orientation: oriented to person, place, and situation Attention: Good Concentration: Good Memory: Impaired short term Fund of knowledge:  Good Insight:  Good Judgment:  Good Impulse Control: Good  Risk Assessment: Danger to Self: No Self-injurious Behavior: No Danger to Ot hers: No Duty to Warn:no Physical Aggression / Violence:No  Access to Firearms a concern: No  Gang Involvement:No  Treatment plan: Will employ cognitive behavioral therapy as well as grief and loss therapy for relief of anxiety and grief.  Goals of grief therapy or to have a healthier response to the loss of his daughter and less sadness as evidenced by  patient report in therapy notes as well as to help him feel less overwhelmed by  her loss.  Goals are to see a 50% reduction in grief symptoms over the next 6 months.  I will encourage the use of the patient telling his grief story about his daughter including encouraging him to bring pictures and memorabilia to help process his feelings including any feelings of guilt associated with her loss.  We will use cognitive behavioral therapy to help identify and change anxiety producing thoughts and behavior patterns so  as to improve his ability to better manage anxiety and stress, and manage thoughts and worrisome thinking contributing to anxiety.  We will also refresh and encourage dialectical behavior therapy distress tolerance and mindfulness  skills with the intention of reducing anxiety by 50% over the next 6 months. Progress: 30% Interventions: Cognitive Behavioral Therapy  Diagnosis:PTSD, Bi-Polar D/O  Plan: F/U/ appointments scheduled wee kly.  Lorrene CHRISTELLA Hasten, [facility]                                                                                                                   Lorrene CHRISTELLA Hasten, [facility]   Behavioral Health Counselor/Therapist Progress Note  Patient ID: Kasten Leveque, MRN: [id number],    Date: 04/01/2024 This session was held via video teletherapy. The patient consented to the video teletherapy and was located in his home during this session.  He is aware it is the responsibility of the patient to secure confidentiality on his end of the session. The provider was in a private home office for the duration of this session.   The patient arrived on time for her Caregility session  Time Spent: 58 minutes, 2 PM to 2:58 PM  Treatment Type: Individual Therapy Reported Symptoms: anxiety, panic attacks A fairly difficult week because it was his daughter's birthday.  He said  this year was more difficult than last year but he could not pinpoint why.  They did remember her about going out to a nice restaurant which is something her daughter always wanted to do.  He said they were able to laugh and joke about funny things that his daughter had said or done which is not something they were able to do this time last year so he saw that as healthy grieving.  He knows that the time change threw things off a little  bit for everybody and his wife still is not feeling well and his back has been bothering him.  He was not able to go to his first round of physical therapy this week because his back  was bothering him more.  He has been thinking a lot more about his daughter which he feels comfortable with.  He is thankful that it is getting warmer and staying light longer so he can get back to some of the coping skills such as fishing that he has done  in the past.   We will continue to work on coping skills for helping with his anxiety and the progression of his Lewy body dementia diagnosis as well as processing his grief. Mental Status Exam: Appearance:  Well Groomed  Behavior: Appropriate Motor: Pt. Reports some instability due to Lewey Body dementia Speech/Language:  Normal Rate Affect: Appropriate Mood: normal Thought process: normal Thought content:  WNL Sens ory/Perceptual disturbances:  WNL Orientation: oriented to person, place, and situation Attention: Good Concentration: Good Memory: Impaired short term Fund of knowledge:  Good Insight:  Good Judgment:  Good Impulse Control: Good  Risk Assessment: Danger to Self: No Self-injurious Behavior: No Danger to Others: No Duty to Warn:no Physical Aggression / Violence:No  Access to Firearms a concern: No  Gang Involvement :No  Treatment plan: Will employ cognitive behavioral therapy as well as grief and loss therapy for relief of anxiety and grief.  Goals of grief therapy or to have a healthier response to the loss of his daughter and less sadness as evidenced by patient report in therapy notes as well as to help him feel less overwhelmed by her loss.  Goals are to see a 50% reduction in grief symptoms over the next 6 months.  I will encourage the use o f the patient telling his grief story about his daughter including encouraging him to bring pictures and memorabilia to help process his feelings including any feelings of guilt associated with her loss.  We will use cognitive behavioral therapy to help identify and change anxiety producing thoughts and behavior patterns so as to improve his ability to better manage  anxiety and stress, and manage thoughts and worrisome thinking contribu ting to anxiety.  We will also refresh and encourage dialectical behavior therapy  distress tolerance and mindfulness skills with the intention of reducing anxiety by 50% over the next 6 months. Progress: 30% Interventions: Cognitive Behavioral Therapy  Diagnosis:PTSD, Bi-Polar D/O  Plan: F/U/ appointments scheduled weekly.  Lorrene CHRISTELLA Hasten, [facility]                                                                                                                    Lorrene CHRISTELLA Hasten, [facility]               Lorrene CHRISTELLA Hasten, [facility]               Lorrene CHRISTELLA Hasten, [facility]  [place] [facility] Counselor/Therapist Progress Note  Patient ID: Elisandro Jarrett, MRN: [id number],    Date: 04/01/2024 This session was held via video teletherapy. The patient consented to the video teletherapy and was located in his home during this session. He is aware it is the  responsibility of the patient to secure confidentiality on his end of the session. The provider was in a private home office for the duration of this session.   The patient arrived on time for her Caregility session  Time Spent: 57 minutes, 2 PM to 2:57 PM  Treatment Type: Individual Therapy Reported Symptoms: anxiety, panic attacks The patient is still having significant back pain.  He cannot sit anywhere for very long and ha s to be careful where he sits.  There is still some unsteadiness although he did not report any falls over the past 2 weeks.  His biggest concern now is for his wife.  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  She is meeting with her cardiologist again next week to see what possible treatment is for her.  He still thinks there may be some issue with her d iaphragm but she is meeting with the pulmonologist also.  He is doing what he can to help her.  His daughter's birthday is March 11 so he knows that is a difficult time for both he and his wife next week.  Typically they do something to remember her but because of the wife's current health issues they cannot go to [place] as they had planned so they are talking about what to do to remember and celebrate her birthday.  We will  continue to work on coping skills for helping with his anxiety and the progression of his Lewy body dementia diagnosis as well as processing his grief. Mental Status Exam: Appearance:   Well Groomed  Behavior: Appropriate Motor: Pt. Reports some instability due to Lewey Body dementia Speech/Language:  Normal Rate Affect: Appropriate Mood: normal Thought process: normal Thought content:  WNL Sensory/Perceptual disturbances :  WNL Orientation: oriented to person, place, and situation Attention: Good Concentration: Good Memory: Impaired short term Fund of knowledge:  Good Insight:  Good Judgment:  Good Impulse Control: Good  Risk Assessment: Danger to Self: No Self-injurious Behavior: No Danger to Others: No Duty to Warn:no Physical Aggression / Violence:No  Access to Firearms a concern: No  Gang Involvement:No  Treatment plan: Will  employ cognitive behavioral therapy as well as grief and loss therapy for relief of anxiety and grief.  Goals of grief therapy or to have a healthier response to the loss of his daughter and less sadness as evidenced by patient report in therapy notes as well as to help him feel less overwhelmed by her loss.  Goals are to see a 50% reduction in grief symptoms over the next 6 months.  I will encourage the use of the patient telling his g rief story about his daughter including encouraging him to bring pictures and memorabilia to help process his feelings including any feelings of guilt associated with her loss.  We will use cognitive behavioral therapy to help identify and change anxiety producing thoughts and behavior patterns so as to improve his ability to better manage anxiety and stress, and manage thoughts and worrisome thinking contributing to anxiety.  We will a lso refresh and encourage dialectical behavior therapy distress tolerance and mindfulness skills  with the intention of reducing anxiety by 50% over the next 6 months. Progress: 30% Interventions: Cognitive Behavioral Therapy  Diagnosis:PTSD, Bi-Polar D/O  Plan: F/U/ appointments scheduled weekly.  Lorrene CHRISTELLA Hasten,  [facility]                                                                                                                    Lorrene CHRISTELLA Hasten, [facility]  [place] [facility] Counselor/Therapist Progress Note  Patient ID: Chazz Philson, MRN: [id number],    Date: 04/01/2024 This session was held via video teletherapy. The patient consented to the video teletherapy and was located in his home during this session. He is aware it is the responsibility of the patient to secure confidentiality on his end of the session. The provider was in a private home  office for the duration of this session.   The patient arrived  on time for her Caregility session  Time Spent: 57 minutes, 2 PM to 2:57 PM  Treatment Type: Individual Therapy Reported Symptoms: anxiety, panic attacks  We will continue to work on coping skills for helping with his anxiety and the progression of his Lewy body dementia diagnosis as well as processing his grief. 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 Speech/Language:  Normal Rate Affect: Appropriate Mood: normal Thought process: normal Thought content:  WNL Sensory/Perceptual disturbances:  WNL Orientation: oriented to person, place, and situation Attention: Good Concentration: Good Memory: Impaired short term Fund of knowledge:  Good Insight:  Good Judgment:  Good Impulse Con trol: Good  Risk Assessment: Danger to Self: No Self-injurious Behavior: No Danger to Others: No Duty to Warn:no Physical Aggression / Violence:No  Access to Firearms a concern: No  Gang Involvement:No  Treatment plan: Will employ cognitive behavioral therapy as well as grief and loss therapy for relief of anxiety and grief.  Goals of grief therapy or to have a healthier response to the loss of his daughter and less sadness  as evidenced by patient report in therapy notes as well as to help him feel less overwhelmed by her loss.  Goals are to see a 50% reduction in grief symptoms over the next 6 months.  I will encourage the use of the patient telling his grief story about his daughter including encouraging him to bring pictures and memorabilia to help process his feelings including any feelings of guilt associated with her loss.  We will use cognitive beha vioral therapy to help identify and change anxiety producing thoughts and behavior patterns so as to improve his ability to better manage anxiety and stress, and manage thoughts and worrisome thinking contributing to anxiety.  We will also refresh and  encourage dialectical behavior therapy distress tolerance and mindfulness skills with the intention of reducing anxiety by 50% over the next 6 months. Progress: 30% Interventions: Cognit ive Behavioral Therapy  Diagnosis:PTSD, Bi-Polar D/O  Plan: F/U/ appointments scheduled weekly.  Lorrene CHRISTELLA Hasten, [facility]                                                                                                                   Lorrene CHRISTELLA Hasten, [facility]               Lorrene CHRISTELLA Hasten, [facility]                Lorrene CHRISTELLA Hasten, [facility]  Lorrene CHRISTELLA Hasten, [facility]               Lorrene CHRISTELLA Hasten, [facility]               Lorrene CHRISTELLA Hasten, [facility]               Lorrene CHRISTELLA Hasten, [facility]  [place] [facility] Counselor/Therapist Progress Note  Patient ID: Eliceo Gladu, MRN: [id number],    Date: 04/01/2024 This session was held via video teletherapy. The patient consented to the video teletherapy and was located in his home during this session. He is aware it is the responsibility of the pati ent to secure confidentiality on his end of the session. The provider was in a private home office for the duration of this session.   The patient arrived on time for her Caregility session  Time Spent: 39 minutes, 2 PM to 2:39 PM  Treatment Type: Individual Therapy Reported Symptoms: anxiety, panic attacks The patient was not feeling well today.  He started feeling really earlier in the week having a fever bad headaches body a ches.  He felt a little better yesterday but feels a little bit of a resurgence in headaches and body aches and is running a low-grade fever.  His wife is feeling some of the same symptoms.  For the most part he says he is doing well.  They did increase his memory medication because they were starting to see some increasing decline in short-term memory and he said until recently his long-term memory had been good but he was starting to  see a difference in how he used to markers to remember what happened at certain times historically.  He feels the medication has helped and has brightened his mood a little also.  He is  still trying to walk every day and fish when he can and has enjoyed watching the [employer] as a good distraction.  He reports that he feels he is managing mood fairly well.  He has an appointment in 2 weeks.  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 We will continue to work on coping skills for helping with his anxiety and the progression of his Lewy body dementia diagnosis as well as processing his grief. Mental Status Exam: Appearance:  Well Groomed  Behavior: Appropriate Motor: Pt. Reports some instability due to Lewey Body dementia Speech/Language:  Normal Rate Affect: Appropriate Mood: normal Thought process: no rmal Thought content:  WNL Sensory/Perceptual disturbances:  WNL Orientation: oriented to person, place, and situation Attention: Good Concentration: Good Memory: Impaired short term Fund of knowledge:  Good Insight:  Good Judgment:  Good Impulse Control: Good  Risk Assessment: Danger to Self: No Self-injurious Behavior: No Danger to Others: No Duty to Warn:no Physical Aggression / Violence:No  Access to [employer] ms a concern: No  Gang Involvement:No  Treatment plan: Will employ cognitive behavioral therapy as well as grief and loss therapy for relief of anxiety and grief.  Goals of grief therapy or to have a healthier response to the loss of his daughter and less sadness as evidenced by patient report in therapy notes as well as to help him feel less overwhelmed by her loss.  Goals are to see a 50% reduction in grief symptoms over the next 6  months.  I will encourage the use of the patient telling his grief story about his daughter including encouraging him to bring pictures and memorabilia to help process his feelings including any feelings of guilt associated with her loss.  We will use cognitive behavioral therapy to help identify and change anxiety producing thoughts and behavior patterns so as to  improve his ability to better manage anxiety and stress, and manage thoug hts and worrisome thinking contributing to anxiety.  We will also refresh  and encourage dialectical behavior therapy distress tolerance and mindfulness skills with the intention of reducing anxiety by 50% over the next 6 months. Progress: 30% Interventions: Cognitive Behavioral Therapy  Diagnosis:PTSD, Bi-Polar D/O  Plan: F/U/ appointments scheduled weekly.  Lorrene CHRISTELLA Hasten, [facility]                                                                                                                    Lorrene CHRISTELLA Hasten, [facility]  [place] [facility] Counselor/Therapist Progress Note  Patient ID: Domanik Rainville, MRN: [id number],    Date: 04/01/2024 This session was held via video teletherapy. The patient consented to the video teletherapy and was located in his home during this session. He is aware it is the responsibility of the patient to secure confidentiality  on his end of the session. The provider was in a private home office for the duration of this session.   The patient arrived on time for her Caregility session  Time Spent: 58 minutes, 2 PM to 2:58 PM  Treatment Type: Individual Therapy Reported Symptoms: anxiety, panic attacks A fairly difficult week because it was his daughter's birthday.  He said this year was more difficult than last year but he could not pinpoint why.  The y did remember her about going out to a nice restaurant which is something her daughter always wanted to do.  He said they were able to laugh and joke about funny things that his daughter had said or done which is not something they were able to do this time last year so he saw that as healthy grieving.  He knows that the time change threw things off a little bit for everybody and his wife still is not feeling well and his back has been  bothering him.  He was not able to go to his first round of physical therapy this week because his back was bothering him more.  He has been thinking a lot more about his daughter which he feels comfortable with.  He is thankful that it is getting warmer and staying light longer so he can get back to some of the coping skills such as fishing that he has done in the past.   We will continue to work on coping skills for helping with h is anxiety and the progression of his Lewy body dementia diagnosis as well as processing his grief. Mental Status Exam: Appearance:  Well Groomed  Behavior: Appropriate Motor: Pt. Reports some instability due to Lewey Body dementia Speech/Language:  Normal  Rate Affect: Appropriate Mood: normal Thought process: normal Thought content:  WNL Sensory/Perceptual disturbances:  WNL Orientation: oriented to person, place, an d situation Attention: Good Concentration: Good Memory: Impaired short term Fund of knowledge:  Good Insight:  Good Judgment:  Good Impulse Control: Good  Risk Assessment: Danger to Self: No Self-injurious Behavior: No Danger to Others: No Duty to Warn:no Physical Aggression / Violence:No  Access to Firearms a concern: No  Gang Involvement:No  Treatment plan: Will employ cognitive behavioral therapy as well as grief  and loss therapy for relief of anxiety and grief.  Goals of grief therapy or to have a healthier response to the loss of his daughter and less sadness as evidenced by patient report in therapy notes as well as to help him feel less overwhelmed by her loss.  Goals are to see a 50% reduction in grief symptoms over the next 6 months.  I will encourage the use of the patient telling his grief story about his daughter including encouraging  him to bring pictures and memorabilia to help process his feelings including any feelings of guilt associated with her loss.  We will use cognitive behavioral therapy to help identify and change anxiety producing thoughts and behavior patterns so as to improve his ability to better manage anxiety and stress, and manage thoughts and worrisome thinking contributing to anxiety.  We will also refresh and encourage dialectical behavior thera py  distress tolerance and mindfulness skills with the intention of reducing anxiety by 50% over the next 6 months. Progress: 30% Interventions: Cognitive Behavioral Therapy  Diagnosis:PTSD, Bi-Polar D/O  Plan: F/U/ appointments scheduled weekly.  Lorrene CHRISTELLA Hasten, [facility]                                                                                                                     Lorrene CHRISTELLA Hasten, [facility]               Lorrene CHRISTELLA Hasten, [facility]               Lorrene CHRISTELLA Hasten, [facility]  [place] [facility] Counselor/Therapist Progress Note  Patient ID: Eithen Castiglia, MRN: [id number],    Date: 04/01/2024 This session was held via video teletherapy. The patient consented to the video teletherapy and was located in his home during this session. He is aware it is the responsibility of the patient to secure confidentiality on his end of the session. The pro vider was in a private home office for the duration of this session.   The patient arrived on time for her Caregility session  Time Spent: 1:05 PM until 2 PM, 55  minutes Treatment Type: Individual Therapy Reported Symptoms: anxiety, panic attacks The patient said he came in from his walk this morning and saw what he thought was some sort of tic from his wife.  It was not the first time it happened and he wonders if it might be  tardive dyskinesia because of some new medications that she is on.  She has a call into the doctor right now and hopes to hear something back soon.  They have narrowed down to the fact they think most of her discomfort in breathing is coming from the diaphragm and they are looking for alternatives for treatment.  The patient continues to do those things which help him cope including walking, fishing when he can, spending time with his  son and daughter-in-law and grandson.  He also has a way of continued coping with grief he is writing letters to his daughters as a way of expressing his feelings and says that is very beneficial.  He appears to be grieving in a healthy way but he knows that he to year anniversary of her death is coming up and there is some grief there but says it is not overwhelming.  We will continue to work on coping skills for helping with his an xiety and the progression of his Lewy body dementia diagnosis as well as processing his grief. Mental Status Exam: Appearance:  Well Groomed  Behavior: Appropriate Motor: Pt. Reports some instability due to Lewey Body dementia Speech/Language:  Normal Rate Affect: Appropriate Mood: normal Thought process: normal Thought content:  WNL Sensory/Perceptual disturbances:  WNL Orientation: oriented to person, place, and sit uation Attention: Good Concentration: Good Memory: Impaired short term Fund of knowledge:  Good Insight:  Good Judgment:  Good Impulse Control: Good  Risk Assessment: Danger to Self: No Self-injurious Behavior: No Danger to Others: No Duty to Warn:no Physical Aggression / Violence:No  Access to Firearms a concern: No  Gang  Involvement:No  Treatment plan: Will employ cognitive behavioral therapy as well as grief and  loss therapy for relief of anxiety and grief.  Goals of grief therapy or to have a healthier response to the loss of his daughter and less sadness as evidenced by patient report in therapy notes as well as to help him feel less overwhelmed by her loss.  Goals are to see a 50% reduction in grief symptoms over the next 6 months.  I will encourage the use of the patient telling his grief story about his daughter including encouraging him t o bring pictures and memorabilia to help process his feelings including any feelings of guilt associated with her loss.  We will use cognitive behavioral therapy to help identify and change anxiety producing thoughts and behavior patterns so as to improve his ability to better manage anxiety and stress, and manage thoughts and worrisome thinking contributing to anxiety.  We will also refresh and encourage dialectical behavior therapy di stress tolerance  and mindfulness skills with the intention of reducing anxiety by 50% over the next 6 months. Progress: 30% Interventions: Cognitive Behavioral Therapy  Diagnosis:PTSD, Bi-Polar D/O  Plan: F/U/ appointments scheduled weekly.  Lorrene CHRISTELLA Hasten, [facility]                                                                                                                    Lorrene CHRISTELLA Hasten, [facility]  [place] [facility] Coun selor/Therapist Progress Note  Patient ID: Lisandro Meggett, MRN: [id number],    Date: 04/01/2024 This session was held via video teletherapy. The patient consented to the video teletherapy and was located in his home during this session. He is aware it is the responsibility of the patient to secure confidentiality on his end of the session. The provider was in a private home office for the duration of this session.   The patien t arrived on time for her Caregility session  Time Spent: 59 minutes, 2 PM to 2:59 PM  Treatment Type: Individual Therapy Reported Symptoms: anxiety, panic attacks The patient continues to present with physical pain and both his back and his knee.  He started with a new physical therapist and told him that there was a lot of arthritis in both knees and or certain things he cannot do but they had him do that anyway and now for the p ast 3 days he has been in significant knee pain.  He has continued to walk because he knows that is good for him in various ways but says it has been painful.  His wife also was not  feeling well but she is going back to the doctor next week to have an ablation and hopes that will bring her some relief.  For the most part he has been at the last couple weeks reaching back out and hearing from some old friends as well as time with his son  and son's family.  He says that his wife's cousin is coming down for a weekend soon and they enjoy time with her and that he will go to his brother's house sometime in the next few weeks.  He recognizes the need for people that he cares about in helping with his depression. We will continue to work on coping skills for helping with his anxiety and the progression of his Lewy body dementia diagnosis as well as processing his grief. Me ntal Status Exam: Appearance:  Well Groomed  Behavior: Appropriate Motor: Pt. Reports some instability due to Lewey Body dementia Speech/Language:  Normal Rate Affect: Appropriate Mood: normal Thought process: normal Thought content:  WNL Sensory/Perceptual disturbances:  WNL Orientation: oriented to person, place, and situation Attention: Good Concentration: Good Memory: Impaired short term Fund of knowledge:  Goo d Insight:  Good Judgment:  Good Impulse Control: Good  Risk Assessment: Danger to Self: No Self-injurious Behavior: No Danger to Others: No Duty to Warn:no Physical Aggression / Violence:No  Access to Firearms a concern: No  Gang Involvement:No  Treatment plan: Will employ cognitive behavioral therapy as well as grief and loss therapy for relief of anxiety and grief.  Goals of grief therapy or to have a healthier respo nse to the loss of his daughter and less sadness as evidenced by patient report in therapy notes as well as to help him feel less overwhelmed by her loss.  Goals are to see a 50% reduction in grief symptoms over the next 6 months.  I will encourage the use of the patient telling his grief story about his daughter including encouraging him to bring pictures and  memorabilia to help process his feelings including any feelings of guilt asso ciated with her loss.  We will use cognitive behavioral therapy to help identify and change anxiety producing thoughts and behavior patterns so as to improve his ability to better manage anxiety and stress, and manage thoughts and worrisome thinking contributing to anxiety.  We will  also refresh and encourage dialectical behavior therapy distress tolerance and mindfulness skills with the intention of reducing anxiety by 50% over the nex t 6 months. Progress: 30% Interventions: Cognitive Behavioral Therapy  Diagnosis:PTSD, Bi-Polar D/O  Plan: F/U/ appointments scheduled weekly.  Lorrene CHRISTELLA Hasten, [facility]                                                                                                                   Lorrene CHRISTELLA Hasten, [facility]                Lorrene CHRISTELLA Hasten, [facility]               Lorrene CHRISTELLA Hasten, [facility]               Lorrene CHRISTELLA Hasten, [facility]  Lorrene CHRISTELLA Hasten, [facility]  [place] [facility] Counselor/Therapist Progress Note  Patient ID:  Lynden Flemmer, MRN: [id number],    Date: 04/01/2024 This session was held via video teletherapy. The patient consented to the video teletherapy and was located in his home during this session. He is aware it is the responsibility of the patient to secure c onfidentiality on his end of the session. The provider was in a private home office for the duration of this session.   The patient arrived on time for her Caregility session  Time Spent: 58 minutes, 2 PM to 2:58 PM  Treatment Type: Individual Therapy Reported Symptoms: anxiety, panic attacks A fairly difficult week because it was his daughter's birthday.  He said this year was more difficult than last year but he could not pin point why.  They did remember her about going out to a nice restaurant which is something her daughter always wanted to do.  He said they were able to laugh and joke about funny things that his daughter had said or done which is not something they were able to do this time last year so he saw that as healthy grieving.  He knows that the time change threw things off a little bit for everybody and his wife still is not feeling well and hi s back has been bothering him.  He was not able to go to his first round of physical therapy this week because his back was bothering him more.  He has been thinking a lot more about his daughter which he feels comfortable with.  He is thankful that it is getting warmer and staying light longer so he can get back to some of the coping skills such as fishing that he has done in the past.   We will continue to work on coping skills for  helping with his anxiety and the progression of his Lewy body dementia diagnosis as well as processing his grief. Mental Status Exam: Appearance:  Well Groomed  Behavior: Appropriate Motor: Pt. Reports some instability due to Lewey Body dementia Speech/Language:  Normal Rate Affect: Appropriate Mood: normal Thought process: normal Thought content:   WNL Sensory/Perceptual disturbances:  WNL Orientation: oriented to pe rson, place, and situation Attention: Good Concentration: Good Memory: Impaired short term Fund of knowledge:  Good Insight:  Good Judgment:  Good Impulse Control: Good  Risk Assessment: Danger to Self: No Self-injurious Behavior: No Danger to Others: No Duty to Warn:no Physical Aggression / Violence:No  Access to Firearms a concern: No  Gang Involvement:No  Treatment plan: Will employ cognitive behavioral therapy a s well as grief and loss therapy for relief of anxiety and grief.  Goals of grief therapy or to have a healthier response to the loss of his daughter and less sadness as evidenced by patient report in therapy notes as well as to help him feel less overwhelmed by her loss.  Goals are to see a 50% reduction in grief symptoms over the next 6 months.  I will encourage the use of the patient telling his grief story about his daughter includi ng encouraging him to bring pictures and memorabilia to help process his feelings including any feelings of guilt associated with her loss.  We will use cognitive behavioral therapy to help identify and change anxiety producing thoughts and behavior patterns so as to improve his ability to better manage anxiety and stress, and manage thoughts and worrisome thinking contributing to anxiety.  We will also refresh and encourage dialectical  behavior therapy  distress tolerance and mindfulness skills with the intention of reducing anxiety by 50% over the next 6 months. Progress: 30% Interventions: Cognitive Behavioral Therapy  Diagnosis:PTSD, Bi-Polar D/O  Plan: F/U/ appointments scheduled weekly.  Lorrene CHRISTELLA Hasten, [facility]                                                                                                                    Lorrene CHRISTELLA Hasten,  [facility]               Lorrene CHRISTELLA Hasten, [facility]               Lorrene CHRISTELLA Hasten, [facility]  [facility] Counselor/Therapist Progress Note  Patient ID: Abubakar Crispo, MRN: [id number],    Date: 04/01/2024 This session was held via video teletherapy. The patient consented to the video teletherapy and was located in his home during this session. He is aware it is the responsibility of the patient to secure confidentiality on his  end of the session. The provider was in a private home office for the duration of this session.   The patient arrived on time for her Caregility session  Time Spent: 57 minutes, 2 PM to 2:57 PM  Treatment Type: Individual Therapy Reported Symptoms: anxiety, panic  attacks The patient is still having significant back pain.  He cannot sit anywhere for very long and has to be careful where he sits.  There is still some unsteadiness  although he did not report any falls over the past 2 weeks.  His biggest concern now is for his wife.  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  She is meeting with her cardiologist again next week to see what possible treatment is for her.  He still thinks there may be some issue with her diaphragm but she is meeting with the pulmonologist also.  He is  doing what he can to help her.  His daughter's birthday is March 11 so he knows that is a difficult time for both he and his wife next week.  Typically they do something to remember her but because of the wife's current health issues they cannot go to [place] as they had planned so they are talking about what to do to remember and celebrate her birthday.  We will continue to work on coping skills for helping with his anxiety a nd the progression of his Lewy body dementia diagnosis as well as processing his grief. Mental Status Exam: Appearance:  Well Groomed  Behavior: Appropriate Motor: Pt. Reports some instability due to Lewey Body dementia Speech/Language:  Normal Rate Affect: Appropriate Mood: normal Thought process: normal Thought content:  WNL Sensory/Perceptual disturbances:  WNL Orientation: oriented to person, place, and situation  Attention: Good Concentration: Good Memory: Impaired short term Fund of knowledge:  Good Insight:  Good Judgment:  Good Impulse Control: Good  Risk Assessment: Danger to Self: No Self-injurious Behavior: No Danger to Others: No Duty to Warn:no Physical Aggression / Violence:No  Access to Firearms a concern: No  Gang Involvement:No  Treatment plan: Will employ cognitive behavioral therapy as well as grief and loss th erapy for relief  of anxiety and grief.  Goals of grief therapy or to have a healthier response to the loss of his daughter and less sadness as evidenced by patient report in therapy notes as well as to help him feel less overwhelmed by her loss.  Goals are to see a 50% reduction in grief symptoms over the next 6 months.  I will encourage the use of the patient telling his grief story about his daughter including encouraging him to bring  pictures and memorabilia to help process his feelings including any feelings of guilt associated with her loss.  We will use cognitive behavioral therapy to help identify and change anxiety producing thoughts and behavior patterns so as to improve his ability to better manage anxiety and stress, and manage thoughts and worrisome thinking contributing to anxiety.  We will also refresh and encourage dialectical behavior therapy distress  tolerance and mindfulness  skills with the intention of reducing anxiety by 50% over the next 6 months. Progress: 30% Interventions: Cognitive Behavioral Therapy  Diagnosis:PTSD, Bi-Polar D/O  Plan: F/U/ appointments scheduled weekly.  Lorrene CHRISTELLA Hasten, [facility]                                                                                                                    Lorrene CHRISTELLA Hasten, [facility]  [facility] Counselor/Therapist Pro gress Note  Patient ID: Glennon Kopko, MRN: [id number],    Date: 04/01/2024 This session was held via video teletherapy. The patient consented to the video teletherapy and was located in his home during this session. He is aware it is the responsibility of the patient to secure confidentiality on his end of the session. The provider was in a private home office for the duration of this session.   The patient arrived on time f or her Caregility session  Time Spent: 57 minutes, 2 PM to 2:57 PM  Treatment Type: Individual Therapy Reported Symptoms: anxiety, panic attacks  The patient's wife continues to heal and improve.  For the most part physically he has done fairly well this week.  The hardest part has been that thy had tp put their  had to put their dog to sleep. SABRAHe feels that he is coping well but indicated that it did stir up some feelings  He had  been in failing health for a while and was about 63 years old. It did bring sadness to him.  For the most part it has been a good week. He is still walking. He is writing in his  journal about his daughter. Hi anxiety in manageable and depression mild.  He does contract for safety. Mental Status Exam: Appearance:  Well Groomed  Behavior: Appropriate Motor: Pt. Reports some instability due to Lewey Body dementia Speech/Langu age:  Normal Rate Affect: Appropriate Mood: normal Thought process: normal Thought content:  WNL Sensory/Perceptual disturbances:  WNL Orientation: oriented to person, place, and situation Attention: Good Concentration: Good Memory: Impaired short term Fund of knowledge:  Good Insight:  Good Judgment:  Good Impulse Control: Good  Risk Assessment: Danger to Self: No Self-injurious Behavior: No Danger to Others:  No Duty to Warn:no Physical Aggression / Violence:No  Access to Firearms a concern: No  Gang Involvement:No  Treatment plan: Will employ cognitive behavioral therapy as well as grief and loss therapy for relief of anxiety and grief.  Goals of grief therapy or to have a healthier response to the loss of his daughter and less sadness as evidenced by patient report in therapy notes as well as to help him feel less overwhelmed by her l oss.  Goals are to see a 50% reduction in grief symptoms over the next 6 months.  I will encourage the use of the patient telling his grief story about his daughter including encouraging him to bring pictures and memorabilia to help process his feelings including any feelings of guilt associated with her loss.  We will use cognitive behavioral therapy to help identify and change anxiety producing thoughts and behavior patterns so as to  improve his ability to better manage anxiety and stress, and manage thoughts and worrisome thinking contributing to anxiety.  We will also refresh and encourage dialectical behavior therapy distress tolerance and mindfulness skills with the intention of reducing anxiety by 50% over the next 6 months. Progress: 35% Reviewed target goals with a new target date of  October 31st, 2025. Interventions: Cognitive Behavioral Therapy  Diagnos pd:EUDI, Bi-Polar D/O  Plan: F/U/ appointments scheduled weekly.  Lorrene CHRISTELLA Hasten, [facility]  Lorrene CHRISTELLA Hasten, [facility]                   Lorrene CHRISTELLA Hasten, [facility]               [address] [place], [facility]               Lorrene CHRISTELLA Hasten, [facility]               Lorrene CHRISTELLA Hasten, [facility]  [facility] Counselor/Therapist Progress Note  Patient ID: Padraig Nhan, MRN: [id number],    Date:  04/01/2024 This session was held via video teletherapy. The patient consented to the video teletherapy and was located in his home during this session. He is aware it is the responsibility of the patient to secure confidentiality on his end of the session. The provider was in a private home office for the duration of this session.    The patient arrived on time for her Caregility session  Time Spent: 59 minutes, 2 PM to 2:59 PM  Treatment Type: Individual Therapy Reported Symptoms: anxiety, panic attacks The patient said he jokingly said to his wife that he had not had any pain issues in the cervical area and then a few days ago he started having pain in his neck and shoulders.  He now sits related to degenerative disk and has some consistent pain somewhere in  his spine with that but jokingly says it just depends on Wednesday which part of his body is decides to show up and hurt.  He is doing the best that he can.  He is still walking which he knows is good for him but does not have right now the strength to do as much in the morning as he has done in the past so he is denying that to 3 times a day for consistency.  He is still doing some positive things and that he is staying in touch with h is brother and other family members.  He is reconnecting with friends from high school and he is which she is enjoying.  One of them has had a similar experience in that she lost her son in the same way the patient lost his daughter.  He said they did not think and Easter in which they set a place for his daughter at the table and put her picture there.  He initially had reservations about doing that he and his wife had made everyone fe el as if she was really there with him.  He still feels that he is grieving in a healthy way.  He and his wife have always taken a trip around both of their birthdays which are 2 days apart.  That is now close to his daughter died so they have started taking some of her rashes with him  and sprinkling them where they go as a way of remembrance.  They are deciding what that looks like this year in July.  He does contract for safety havi ng no thoughts of hurting himself or anyone else. We will continue to work on coping skills for helping with his anxiety and the progression of his Lewy body dementia diagnosis as well as processing his grief. Mental Status Exam: Appearance:  Well Groomed  Behavior: Appropriate Motor: Pt. Reports some instability due to Lewey Body dementia Speech/Language:  Normal Rate Affect: Appropriate Mood: normal Thought process: norma l Thought content:  WNL Sensory/Perceptual disturbances:  WNL Orientation: oriented to person, place, and situation Attention: Good Concentration: Good Memory: Impaired short term Fund of knowledge:  Good Insight:  Good Judgment:  Good Impulse Control: Good  Risk Assessment: Danger to Self: No Self-injurious Behavior: No Danger to Others: No Duty to Warn:no Physical Aggression / Violence:No  Access to Firearms  a concern: No  Gang Involvement:No  Treatment plan: Will employ cognitive behavioral therapy as well as grief and loss therapy for relief of anxiety and grief.  Goals of grief  therapy or to have a healthier response to the loss of his daughter and less sadness as evidenced by patient report in therapy notes as well as to help him feel less overwhelmed by her loss.  Goals are to see a 50% reduction in grief symptoms over the next 6 mon ths.  I will encourage the use of the patient telling his grief story about his daughter including encouraging him to bring pictures and memorabilia to help process his feelings including any feelings of guilt associated with her loss.  We will use cognitive behavioral therapy to help identify and change anxiety producing thoughts and behavior patterns so as to improve his ability to better manage anxiety and stress, and manage thoughts  and worrisome thinking  contributing to anxiety.  We will also refresh and encourage dialectical behavior therapy distress tolerance and mindfulness skills with the intention of reducing anxiety by 50% over the next 6 months. Progress: 30% Interventions: Cognitive Behavioral Therapy  Diagnosis:PTSD, Bi-Polar D/O  Plan: F/U/ appointments scheduled weekly.  Lorrene CHRISTELLA Hasten, [facility]                                                                                                                    Lorrene CHRISTELLA Hasten, [facility]   Behavioral Health Counselor/Therapist Progress Note  Patient ID: Tonya Carlile, MRN: [id number],    Date:  04/01/2024 This session was held via video teletherapy. The patient consented to the video teletherapy and was located in his home during this session. He is aware it is the responsibility of the patient to secure confidentiality on  his end of the session. The provider was in a private home office for the duration of this session.   The patient arrived on time for her Caregility session  Time Spent: 58 minutes, 2 PM to 2:58 PM  Treatment Type: Individual Therapy Reported Symptoms: anxiety, panic attacks A fairly difficult week because it was his daughter's birthday.  He said this year was more difficult than last year but he could not pinpoint why.  They d id remember her about going out to a nice restaurant which is something her daughter always wanted to do.  He said they were able to laugh and joke about funny things that his daughter had said or done which is not something they were able to do this time last year so he saw that as healthy grieving.  He knows that the time change threw things off a little bit for everybody and his wife still is not feeling well and his back has been bo thering him.  He was not able to go to his first round of physical therapy this week because his back was bothering him more.  He has been thinking a lot more about his daughter which he feels comfortable with.  He is thankful that it is getting warmer and staying light longer so he can get back to some of the coping skills such as fishing that he has done in the past.   We will continue to work on coping skills for helping with his  anxiety and the progression of his Lewy body dementia diagnosis as well as processing his grief. Mental Status Exam: Appearance:  Well Groomed  Behavior: Appropriate Motor: Pt. Reports some instability due to Lewey Body dementia Speech/Language:  Normal Rate Affect: Appropriate Mood: normal Thought process: normal Thought content:  WNL Sensory/Perceptual disturbances:   WNL Orientation: oriented to person, place, and s ituation Attention: Good Concentration: Good Memory: Impaired short term Fund of knowledge:  Good Insight:  Good Judgment:  Good Impulse Control: Good  Risk Assessment: Danger to Self: No Self-injurious Behavior: No Danger to Others: No Duty to Warn:no Physical Aggression / Violence:No  Access to Firearms a concern: No  Gang Involvement:No  Treatment plan: Will employ cognitive behavioral therapy as well as grief an d loss therapy for relief of anxiety and grief.  Goals of grief therapy or to have a healthier response to the loss of his daughter and less sadness as evidenced by patient report in therapy notes as well as to help him feel less overwhelmed by her loss.  Goals are to see a 50% reduction in grief symptoms over the next 6 months.  I will encourage the use of the patient telling his grief story about his daughter including encouraging him  to bring pictures and memorabilia to help process his feelings including any feelings of guilt associated with her loss.  We will use cognitive behavioral therapy to help identify and change anxiety producing thoughts and behavior patterns so as to improve his ability to better manage anxiety and stress, and manage thoughts and worrisome thinking contributing to anxiety.  We will also refresh and encourage dialectical behavior therapy  distress tolerance and mindfulness skills with the intention of reducing anxiety by 50% over the next 6 months. Progress: 30% Interventions: Cognitive Behavioral Therapy  Diagnosis:PTSD, Bi-Polar D/O  Plan: F/U/ appointments scheduled weekly.  Lorrene CHRISTELLA Hasten, [facility]                                                                                                                    Lorrene CHRISTELLA Hasten, [facility]               Lorrene CHRISTELLA Hasten,  [facility]               Lorrene CHRISTELLA Hasten, [facility]  [facility] [facility] Counselor/Therapist Progress Note  Patient ID: Bobbi Kozakiewicz, MRN: [id number],    Date: 04/01/2024 This session was held via video teletherapy. The patient consented to the video teletherapy and was located in his home during this session. He is aware it is the responsibility of the patient to secure confidentiality on his end of the session. The provid er was in a private home office for the duration of this session.   The patient arrived on time for her Caregility session  Time Spent: 1:05 PM until 2 PM, 55 minutes Treatment Type: Individual Therapy Reported Symptoms: anxiety, panic attacks The patient said he came in from his walk this morning and saw  what he thought was some sort of tic from his wife.  It was not the first time it happened and he wonders if it might be tar dive dyskinesia because of some new medications that she is on.  She has a call into the doctor right now and hopes to hear something back soon.  They have narrowed down to the fact they think most of her discomfort in breathing is coming from the diaphragm and they are looking for alternatives for treatment.  The patient continues to do those things which help him cope including walking, fishing when he can, spending time with his so n and daughter-in-law and grandson.  He also has a way of continued coping with grief he is writing letters to his daughters as a way of expressing his feelings and says that is very beneficial.  He appears to be grieving in a healthy way but he knows that he to year anniversary of her death is coming up and there is some grief there but says it is not overwhelming.  We will continue to work on coping skills for helping with his anxie ty and the progression of his Lewy body dementia diagnosis as well as processing his grief. Mental Status Exam: Appearance:  Well Groomed  Behavior: Appropriate Motor: Pt. Reports some instability due to Lewey Body dementia Speech/Language:  Normal Rate Affect: Appropriate Mood: normal Thought process: normal Thought content:  WNL Sensory/Perceptual disturbances:  WNL Orientation: oriented to person, place, and situat ion Attention: Good Concentration: Good Memory: Impaired short term Fund of knowledge:  Good Insight:  Good Judgment:  Good Impulse Control: Good  Risk Assessment: Danger to Self: No Self-injurious Behavior: No Danger to Others: No Duty to Warn:no Physical Aggression / Violence:No  Access to Firearms a concern: No  Gang Involvement:No  Treatment plan: Will employ cognitive behavioral therapy as well as grief and los s therapy for relief of anxiety and grief.  Goals of grief  therapy or to have a healthier response to the loss of his daughter and less sadness as evidenced by patient report in therapy notes as well as to help him feel less overwhelmed by her loss.  Goals are to see a 50% reduction in grief symptoms over the next 6 months.  I will encourage the use of the patient telling his grief story about his daughter including encouraging him to b ring pictures and memorabilia to help process his feelings including any feelings of guilt associated with her loss.  We will use cognitive behavioral therapy to help identify and change anxiety producing thoughts and behavior patterns so as to improve his ability to better manage anxiety and stress, and manage thoughts and worrisome thinking contributing to anxiety.  We will also refresh and encourage dialectical behavior therapy distr ess tolerance  and mindfulness skills with the intention of reducing anxiety by 50% over the next 6 months. Progress: 30% Interventions: Cognitive Behavioral Therapy  Diagnosis:PTSD, Bi-Polar D/O  Plan: F/U/ appointments scheduled weekly.  Lorrene CHRISTELLA Hasten, [facility]                                                                                                                    Lorrene CHRISTELLA Hasten, [facility]  [facility] Counsel or/Therapist Progress Note  Patient ID: Hammond Obeirne, MRN: [id number],    Date: 04/01/2024 This session was held via video teletherapy. The patient consented to the video teletherapy and was located in his home during this session. He is aware it is the responsibility of the patient to secure confidentiality on his end of the session. The provider was in a private home office for the duration of this session.   The patient a rrived on time for her Caregility session  Time Spent: 59 minutes, 2 PM to 2:59 PM  Treatment Type: Individual Therapy Reported Symptoms: anxiety, panic attacks The patient continues to present with physical pain and both his back and his knee.  He started with a new physical therapist and told him that there was a lot of arthritis in both knees and or certain things he cannot do but they had him do that anyway and now for the past  3 days he has been in significant knee pain.  He has continued to walk because he knows that is good for him in various ways but says it has been painful.  His wife also was not  feeling well but she is going back to the doctor next week to have an ablation and hopes that will bring her some relief.  For the most part he has been at the last couple weeks reaching back out and hearing from some old friends as well as time with his son an d son's family.  He says that his wife's cousin is coming down for a weekend soon and they enjoy time with her and that he will go to his brother's house sometime in the next few weeks.  He recognizes the need for people that he cares about in helping with his depression. We will continue to work on coping skills for helping with his anxiety and the progression of his Lewy body dementia diagnosis as well as processing his grief. Menta l Status Exam: Appearance:  Well Groomed  Behavior: Appropriate Motor: Pt. Reports some instability due to Lewey Body dementia Speech/Language:  Normal Rate Affect: Appropriate Mood: normal Thought process: normal Thought content:  WNL Sensory/Perceptual disturbances:  WNL Orientation: oriented to person, place, and situation Attention: Good Concentration: Good Memory: Impaired short term Fund of knowledge:  Good  Insight:  Good Judgment:  Good Impulse Control: Good  Risk Assessment: Danger to Self: No Self-injurious Behavior: No Danger to Others: No Duty to Warn:no Physical Aggression / Violence:No  Access to Firearms a concern: No  Gang Involvement:No  Treatment plan: Will employ cognitive behavioral therapy as well as grief and loss therapy for relief of anxiety and grief.  Goals of grief therapy or to have a healthier response  to the loss of his daughter and less sadness as evidenced by patient report in therapy notes as well as to help him feel less overwhelmed by her loss.  Goals are to see a 50% reduction in grief symptoms over the next 6 months.  I will encourage the use of the patient telling his grief story about his daughter including encouraging him to bring pictures and  memorabilia to help process his feelings including any feelings of guilt associa ted with her loss.  We will use cognitive behavioral therapy to help identify and change anxiety producing thoughts and behavior patterns so as to improve his ability to better manage anxiety and stress, and manage thoughts and worrisome thinking contributing to anxiety.  We will  also refresh and encourage dialectical behavior therapy distress tolerance and mindfulness skills with the intention of reducing anxiety by 50% over the next 6  months. Progress: 30% Interventions: Cognitive Behavioral Therapy  Diagnosis:PTSD, Bi-Polar D/O  Plan: F/U/ appointments scheduled weekly.  Lorrene CHRISTELLA Hasten, [facility]                                                                                                                   Lorrene CHRISTELLA Hasten, [facility]                Lorrene CHRISTELLA Hasten, [facility]               Lorrene CHRISTELLA Hasten, [facility]               Lorrene CHRISTELLA Hasten, [facility]  [facility] Counselor/Therapist Progress Note  Patient ID: Hervey Wedig, MRN: [id number],    Date: 04/01/2024 This session was held via video teletherapy. The patient consented to the video teletherapy and was located in his home during this session. He is aware it is the responsibility of the patient to secure confidentiality on his end of the session. The provider was in a private home office for the  duration of this session.   The patient arrived on time for her Caregility session  Time Spent: 57 minutes, 2 PM to 2:57 PM  Treatment Type: Individual Therapy Reported Symptoms: anxiety, panic attacks The patient is still having significant back pain.  He cannot sit anywhere for very long and has to be careful where he sits.  There is still some unsteadiness although he did not report any falls over the past 2 weeks.  His bigg est concern now is for his wife.  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  She is meeting with her cardiologist again next week to see what possible treatment is for her.  He still thinks there may be some issue with her diaphragm but she is meeting with the pulmonologist also.  He is doing what he can to help her.  His daughter's birthday is March 11  so he knows that is a difficult time for both he and his wife next week.  Typically they do something to remember her but because of the wife's current health issues they cannot go to  [place] as they had planned so they are talking about what to do to remember and celebrate her birthday.  We will continue to work on coping skills for helping with his anxiety and the progression of his Lewy body dementia diagnosis as well as proc essing his grief. Mental Status Exam: Appearance:  Well Groomed  Behavior: Appropriate Motor: Pt. Reports some instability due to Lewey Body dementia Speech/Language:  Normal Rate Affect: Appropriate Mood: normal Thought process: normal Thought content:  WNL Sensory/Perceptual disturbances:  WNL Orientation: oriented to person, place, and situation Attention: Good Concentration: Good Memory: Impaired short term Fu nd of knowledge:  Good Insight:  Good Judgment:  Good Impulse Control: Good  Risk Assessment: Danger to Self: No Self-injurious Behavior: No Danger to Others: No Duty to Warn:no Physical Aggression / Violence:No  Access to Firearms a concern: No  Gang Involvement:No  Treatment plan: Will employ cognitive behavioral therapy as well as grief and loss therapy for relief of anxiety and grief.  Goals of grief therapy or to h ave a healthier response to the loss of his daughter and less sadness as evidenced by patient report in therapy notes as well as to help him feel less overwhelmed by her loss.  Goals are to see a 50% reduction in grief symptoms over the next 6 months.  I will encourage the use of the patient telling his grief story about his daughter including encouraging him to bring pictures and memorabilia to help process his feelings including any f eelings of guilt associated with her loss.  We will use cognitive behavioral therapy to help identify and change anxiety producing thoughts and behavior patterns so as to improve his ability to better manage anxiety and stress, and manage thoughts and worrisome thinking contributing to anxiety.  We will also refresh and encourage dialectical behavior therapy distress  tolerance and mindfulness skills  with the intention of reducing anxiet y by 50% over the next 6 months. Progress: 30% Interventions: Cognitive Behavioral Therapy  Diagnosis:PTSD, Bi-Polar D/O  Plan: F/U/ appointments scheduled weekly.  Lorrene CHRISTELLA Hasten, [facility]                                                                                                                   Lorrene CHRISTELLA Hasten,  [facility]  [facility] Counselor/Therapist Progress Note  Patient ID: Deaven Urwin, MRN: [id number],    Date: 04/01/2024 This session was held via video teletherapy. The patient consented to the video  teletherapy and was located in his home during this session. He is aware it is the responsibility of the patient to secure confidentiality on his end of the session. The provider was in a private home office for the duration of this session.   The patient arrived on time for her Caregility session  Time Spent: 58 minutes, 2 PM  to 2:58 PM  Treatment Type: Individual Therapy Reported Symptoms: anxiety, panic attacks A fairly difficult week because it was his daughter's birthday.  He said this year was more difficult than last year but he could not pinpoint why.  They did remember her about going out to a nice restaurant which is something her daughter always wanted to do.  He said they were able to laugh and joke about funny things that his daughter had said  or done which is not something they were able to do this time last year so he saw that as healthy grieving.  He knows that the time change threw things off a little bit for everybody and his wife still is not feeling well and his back has been bothering him.  He was not able to go to his first round of physical therapy this week because his back was bothering him more.  He has been thinking a lot more about his daughter which he feels  comfortable with.  He is thankful that it is getting warmer and staying light longer so he can get back to some of the coping skills such as fishing that he has done in the past.   We will continue to work on coping skills for helping with his anxiety and the progression of his Lewy body dementia diagnosis as well as processing his grief. Mental Status Exam: Appearance:  Well Groomed  Behavior: Appropriate Motor: Pt. Reports  some instability due to Lewey Body dementia Speech/Language:  Normal Rate Affect: Appropriate Mood: normal Thought process: normal Thought content:  WNL Sensory/Perceptual disturbances:  WNL Orientation: oriented to person, place, and  situation Attention: Good Concentration: Good Memory: Impaired short term Fund of knowledge:  Good Insight:  Good Judgment:  Good Impulse Control: Good  Risk Assessment: Danger to Se lf: No Self-injurious Behavior: No Danger to Others: No Duty to Warn:no Physical Aggression / Violence:No  Access to Firearms a concern: No  Gang Involvement:No  Treatment plan: Will employ cognitive behavioral therapy as well as grief and loss therapy for relief of anxiety and grief.  Goals of grief therapy or to have a healthier response to the loss of his daughter and less sadness as evidenced by patient report in therapy no tes as well as to help him feel less overwhelmed by her loss.  Goals are to see a 50% reduction in grief symptoms over the next 6 months.  I will encourage the use of the patient telling his grief story about his daughter including encouraging him to bring pictures and memorabilia to help process his feelings including any feelings of guilt associated with her loss.  We will use cognitive behavioral therapy to help identify and change a nxiety producing thoughts and behavior patterns so as to improve his ability to better manage anxiety and stress, and manage thoughts and worrisome thinking contributing to anxiety.  We will also refresh and encourage dialectical behavior therapy  distress tolerance and mindfulness skills with the intention of reducing anxiety by 50% over the next 6 months. Progress: 30% Interventions: Cognitive Behavioral Therapy  Diagnosis:PTSD, Bi -Polar D/O  Plan: F/U/ appointments scheduled weekly.  Lorrene CHRISTELLA Hasten, [facility]                                                                                                                   Lorrene CHRISTELLA Hasten, [facility]               Lorrene CHRISTELLA Hasten, [facility]               Lorrene CHRISTELLA Hasten,  [facility]  [facility] Counselor/Therapist Progress Note  Patient ID: Maxamillian Tienda, MRN: [id number],    Date: 5/30/202 5 This session was held via video teletherapy. The patient consented to the video teletherapy and was located in his home during this session. He is aware it is the responsibility of the patient to secure confidentiality on his end of the session. The provider was in a private home office for the duration of this session.   The patient arrived on time for her Caregility session  Time Spent: 57 minutes, 2 PM to 2:57 PM  Treatment  Type: Individual Therapy Reported Symptoms: anxiety, panic attacks The patient is still having significant back pain.  He cannot sit anywhere for very long and has to be  careful where he sits.  There is still some unsteadiness although he did not report any falls over the past 2 weeks.  His biggest concern now is for his wife.  She has been having some difficulty breathing and they did a test so they found a small hole on the back si de of her heart.  She is meeting with her cardiologist again next week to see what possible treatment is for her.  He still thinks there may be some issue with her diaphragm but she is meeting with the pulmonologist also.  He is doing what he can to help her.  His daughter's birthday is March 11 so he knows that is a difficult time for both he and his wife next week.  Typically they do something to remember her but because of the wife 's current health issues they cannot go to [place] as they had planned so they are talking about what to do to remember and celebrate her birthday.  We will continue to work on coping skills for helping with his anxiety and the progression of his Lewy body dementia diagnosis as well as processing his grief. Mental Status Exam: Appearance:  Well Groomed  Behavior: Appropriate Motor: Pt. Reports some instability due to Stephen ey Body dementia Speech/Language:  Normal Rate Affect: Appropriate Mood: normal Thought process: normal Thought content:  WNL Sensory/Perceptual disturbances:  WNL Orientation: oriented to person, place, and situation Attention: Good Concentration: Good Memory: Impaired short term Fund of knowledge:  Good Insight:  Good Judgment:  Good Impulse Control: Good  Risk Assessment: Danger to Self: No Self-injurious Be havior: No Danger to Others: No Duty to Warn:no Physical Aggression / Violence:No  Access to Firearms a concern: No  Gang Involvement:No  Treatment plan: Will employ cognitive behavioral therapy as well as grief and loss therapy for relief of anxiety and grief.  Goals of grief therapy or to have a healthier response to the loss of his daughter and  less sadness as evidenced by patient report in therapy notes as well as to help him  feel less overwhelmed by her loss.  Goals are to see a 50% reduction in grief symptoms over the next 6 months.  I will encourage the use of the patient telling his grief story about his daughter including encouraging him to bring pictures and memorabilia to help process his feelings including any feelings of guilt associated with her loss.  We will use cognitive behavioral therapy to help identify and change anxiety producing thoughts a nd behavior patterns so as to improve his ability to better manage anxiety and stress, and manage thoughts and worrisome thinking contributing to anxiety.  We will also refresh and encourage dialectical behavior therapy distress tolerance and mindfulness  skills with the intention of reducing anxiety by 50% over the next 6 months. Progress: 30% Interventions: Cognitive Behavioral Therapy  Diagnosis:PTSD, Bi-Polar D/O  Plan: F/U/ ap pointments scheduled weekly.  Lorrene CHRISTELLA Hasten, [facility]                                                                                                                   Lorrene CHRISTELLA Hasten, [facility]  [facility] Counselor/Therapist Progress Note  Patient ID: Jahmil Macleod, MRN: [id number],    Date: 04/01/2024 This session was held via video teletherapy. The patient consented to the video teletherapy and was located in his h ome during this session. He is aware it is the responsibility of the patient to secure confidentiality on his end of the session. The provider was in a private home office for the duration of this session.   The patient arrived on time for her Caregility session  Time Spent: 57 minutes, 2 PM to 2:57 PM  Treatment Type: Individual Therapy Reported Symptoms: anxiety, panic attacks  We will continue to work on coping skills for h elping with his anxiety and the progression of his Lewy body dementia diagnosis as well as processing his grief. Mental Status Exam: Appearance:  Well Groomed  Behavior: Appropriate Motor: Pt. Reports some instability due to Lewey Body dementia Speech/Language:  Normal Rate Affect: Appropriate Mood: normal Thought process: normal Thought content:  WNL Sensory/Perceptual disturbances:  WNL Orientation: oriented to pers on, place, and situation Attention: Good Concentration: Good Memory: Impaired short term Fund of knowledge:  Good Insight:  Good Judgment:  Good Impulse Control: Good  Risk  Assessment: Danger to Self: No Self-injurious Behavior: No Danger to Others: No Duty to Warn:no Physical Aggression / Violence:No  Access to Firearms a concern: No  Gang Involvement:No  Treatment plan: Will employ cognitive behavioral therapy as  well as grief and loss therapy for relief of anxiety and grief.  Goals of grief therapy or to have a healthier response to the loss of his daughter and less sadness as evidenced by patient report in therapy notes as well as to help him feel less overwhelmed by her loss.  Goals are to see a 50% reduction in grief symptoms over the next 6 months.  I will encourage the use of the patient telling his grief story about his daughter including  encouraging him to bring pictures and memorabilia to help process his feelings including any feelings of guilt associated with her loss.  We will use cognitive behavioral therapy to help identify and change anxiety producing thoughts and behavior patterns so as to improve his ability to better manage anxiety and stress, and manage thoughts and worrisome thinking contributing to anxiety.  We will also refresh and encourage dialectical b ehavior therapy distress tolerance and mindfulness skills with the intention of reducing anxiety by 50% over the next 6 months. Progress: 30% Interventions: Cognitive Behavioral Therapy  Diagnosis:PTSD, Bi-Polar D/O  Plan: F/U/ appointments scheduled weekly.  Lorrene CHRISTELLA Hasten, [facility]                                                                                                                    Lorrene CHRISTELLA Hasten, [facility]               Lorrene CHRISTELLA Hasten, [facility]               Lorrene CHRISTELLA Hasten, [facility]  Lorrene CHRISTELLA Hasten, [facility]               Lorrene CHRISTELLA Hasten, [facility]               Lorrene CHRISTELLA Hasten,  [facility]               Lorrene CHRISTELLA Hasten, [facility]  [facility] Counselor/Therapist Progress Note  Patient ID: Khalin Royce, MRN: [id number],    Date: 04/01/2024 This session was held vi a video teletherapy. The patient consented to the video teletherapy and was located in his home during this session. He is aware it is the responsibility of the patient to secure confidentiality on his end of the session. The provider was in a private home office for the duration of this session.   The patient arrived on time for her Caregility session  Time Spent: 39 minutes, 2 PM to 2:39 PM  Treatment Type: Individual Therapy  Reported Symptoms: anxiety, panic attacks The patient was not feeling well today.  He started  feeling really earlier in the week having a fever bad headaches body aches.  He felt a little better yesterday but feels a little bit of a resurgence in headaches and body aches and is running a low-grade fever.  His wife is feeling some of the same symptoms.  For the most part he says he is doing well.  They did increase his memory medication  because they were starting to see some increasing decline in short-term memory and he said until recently his long-term memory had been good but he was starting to see a difference in how he used to markers to remember what happened at certain times historically.  He feels the medication has helped and has brightened his mood a little also.  He is still trying to walk every day and fish when he can and has enjoyed watching the [employer] as a good distraction.  He reports that he feels he is managing mood fairly well.  He has an appointment in 2 weeks.  He does contract for safety having no thoughts of hurting himself or anyone else. We will continue to work on coping skills for helping with his anxiety and the progression of his Lewy body dementia diagnosis as well as processing his grief. Mental Status Exam: Appearance:  Well Groomed  Behavio r: Appropriate Motor: Pt. Reports some instability due to Lewey Body dementia Speech/Language:  Normal Rate Affect: Appropriate Mood: normal Thought process: normal Thought content:  WNL Sensory/Perceptual disturbances:  WNL Orientation: oriented to person, place, and situation Attention: Good Concentration: Good Memory: Impaired short term Fund of knowledge:  Good Insight:  Good Judgment:  Good Impulse Control: Goo d  Risk Assessment: Danger to Self: No Self-injurious Behavior: No Danger to Others: No Duty to Warn:no Physical Aggression / Violence:No  Access to Firearms a concern: No  Gang Involvement:No  Treatment plan: Will employ cognitive behavioral therapy as well as grief  and loss therapy for relief of anxiety and grief.  Goals of grief therapy or to have a healthier response to the loss of his daughter and less sadness as eviden ced by patient report in therapy notes as well as to help him feel less overwhelmed by her loss.  Goals are to see a 50% reduction in grief symptoms over the next 6 months.  I will encourage the use of the patient telling his grief story about his daughter including encouraging him to bring pictures and memorabilia to help process his feelings including any feelings of guilt associated with her loss.  We will use cognitive behavioral th erapy to help identify and change anxiety producing thoughts and behavior patterns so as to improve his ability to better manage anxiety and stress, and manage thoughts and worrisome thinking contributing to anxiety.  We will also refresh  and encourage dialectical behavior therapy distress tolerance and mindfulness skills with the intention of reducing anxiety by 50% over the next 6 months. Progress: 30% Interventions: Cognitive Behav ioral Therapy  Diagnosis:PTSD, Bi-Polar D/O  Plan: F/U/ appointments scheduled weekly.  Lorrene CHRISTELLA Hasten, [facility]                                                                                                                   Lorrene CHRISTELLA Hasten, [facility]  [place] [facility] Counselor/Therapist Progress Note  Patient ID: Dewel Lotter, MRN: [id number],    Date: 04/01/2024 This session was held via video teletherapy. The patie nt consented to the video teletherapy and was located in his home during this session. He is aware it is the responsibility of the patient to secure confidentiality on his end of the session. The provider was in a private home office for the duration of this session.   The patient arrived on time for her Caregility session  Time Spent: 58 minutes, 2 PM to 2:58 PM  Treatment Type: Individual Therapy Reported Symptoms: anxiety, pa nic attacks A fairly difficult week because it was his daughter's birthday.  He said this year was more difficult than last year but he could not pinpoint why.  They did remember her about going out to a nice restaurant which is something her daughter always wanted to do.  He said they were able to laugh and joke about funny things that his daughter had said or done which is not something they were able to do this time last year so he  saw that as healthy grieving.  He knows that the time change threw things off a little bit for everybody and his wife still is not feeling well and his back has been bothering him.  He  was not able to go to his first round of physical therapy this week because his back was bothering him more.  He has been thinking a lot more about his daughter which he feels comfortable with.  He is thankful that it is getting warmer and staying light l onger so he can get back to some of the coping skills such as fishing that he has done in the past.   We will continue to work on coping skills for helping with his anxiety and the progression of his Lewy body dementia diagnosis as well as processing his grief. Mental Status Exam: Appearance:  Well Groomed  Behavior: Appropriate Motor: Pt. 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 Speech/Language:  Normal Rate Aff ect: Appropriate Mood: normal Thought process: normal Thought content:  WNL Sensory/Perceptual disturbances:  WNL Orientation: oriented to person, place, and situation Attention: Good Concentration: Good Memory: Impaired short term Fund of knowledge:  Good Insight:  Good Judgment:  Good Impulse Control: Good  Risk Assessment: Danger to Self: No Self-injurious Behavior: No Danger to Others: No Duty to Warn:no P hysical Aggression / Violence:No  Access to Firearms a concern: No  Gang Involvement:No  Treatment plan: Will employ cognitive behavioral therapy as well as grief and loss therapy for relief of anxiety and grief.  Goals of grief therapy or to have a healthier response to the loss of his daughter and less sadness as evidenced by patient report in therapy notes as well as to help him feel less overwhelmed by her loss.  Goals are to see  a 50% reduction in grief symptoms over the next 6 months.  I will encourage the use of the patient telling his grief story about his daughter including encouraging him to bring pictures and memorabilia to help process his feelings including any feelings of guilt associated with her loss.  We will use cognitive behavioral therapy to help identify and change anxiety  producing thoughts and behavior patterns so as to improve his ability to  better manage anxiety and stress, and manage thoughts and worrisome thinking contributing to anxiety.  We will also refresh and encourage dialectical behavior therapy  distress tolerance and mindfulness skills with the intention of reducing anxiety by 50% over the next 6 months. Progress: 30% Interventions: Cognitive Behavioral Therapy  Diagnosis:PTSD, Bi-Polar D/O  Plan: F/U/ appointments scheduled weekly.  Lorrene CHRISTELLA Hasten, [facility]                                                                                                                   Lorrene CHRISTELLA Hasten, [facility]               Lorrene CHRISTELLA Hasten, [facility]               Lorrene CHRISTELLA Hasten, [facility]   Behavioral Health Counselor/Therapist Progress Note  Patient ID: Ohm Dentler, MRN: [id number],    Date: 04/01/2024 This session was held via video teletherapy. The patient consented to the video telether apy and was located in his home during this session. He is aware it is the responsibility of the patient to secure confidentiality on his end of the session. The provider was in a private home office for the duration of this session.   The patient arrived on time for her Caregility session  Time Spent: 1:05 PM until 2 PM, 55 minutes Treatment Type: Individual Therapy Reported Symptoms: anxiety, panic attacks The patient said he  came in from his walk this morning and saw what he thought was some sort of tic from his wife.  It was not the first time it happened and he wonders if it might be tardive dyskinesia because of some new medications that she is on.  She has a call into the doctor right now and hopes to hear something back soon.  They have narrowed down to the fact they think most of her discomfort in breathing is coming from the diaphragm and they are l ooking for alternatives for treatment.  The patient continues to do those things which help him cope including walking, fishing when he can, spending time with his son and daughter-in-law and grandson.  He also has a way of continued coping with grief he is writing letters to his daughters as a way of expressing his feelings and says that is very beneficial.  He appears to be grieving in a healthy way but he knows that he to year anni versary of her death is coming up and there is some grief there but says it is not overwhelming.  We will continue to work on coping skills for helping with his anxiety and the  progression of his Lewy body dementia diagnosis as well as processing his grief. Mental Status Exam: Appearance:  Well Groomed  Behavior: Appropriate Motor: Pt. 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 Speech/Language:  Normal Rate Affect:  Appropriate Mood: normal Thought process: normal Thought content:  WNL Sensory/Perceptual disturbances:  WNL Orientation: oriented to person, place, and situation Attention: Good Concentration: Good Memory: Impaired short term Fund of knowledge:  Good Insight:  Good Judgment:  Good Impulse Control: Good  Risk Assessment: Danger to Self: No Self-injurious Behavior: No Danger to Others: No Duty to Warn:no Physic al Aggression / Violence:No  Access to Firearms a concern: No  Gang Involvement:No  Treatment plan: Will employ cognitive behavioral therapy as well as grief and loss therapy for relief of anxiety and grief.  Goals of grief therapy or to have a healthier response to the loss of his daughter and less sadness as evidenced by patient report in therapy notes as well as to help him feel less overwhelmed by her loss.  Goals are to see a 50 % reduction in grief symptoms over the next 6 months.  I will encourage the use of the patient telling his grief story about his daughter including encouraging him to bring pictures and memorabilia to help process his feelings including any feelings of guilt associated with her loss.  We will use cognitive behavioral therapy to help identify and change anxiety producing thoughts and behavior patterns so as to improve his ability to bett er manage anxiety and stress, and manage thoughts and worrisome thinking contributing to anxiety.  We will also refresh and encourage dialectical behavior therapy distress tolerance and  mindfulness skills with the intention of reducing anxiety by 50% over the next 6 months. Progress: 30% Interventions: Cognitive Behavioral Therapy  Diagnosis:PTSD, Bi-Polar  D/O  Plan: F/U/ appointments scheduled weekly.  Lorrene CHRISTELLA Hasten, [facility]                                                                                                                    Lorrene CHRISTELLA Hasten, [facility]  [place] Behavioral Health Counselor/Therapist Progress Note  Patient ID: Sotirios Navarro, MRN: [id number],    Date: 04/01/2024 This session was held via video teletherapy. The patient consented to the video teletherapy and was located in his home during this session. He is aware it is the responsibi lity of the patient to secure confidentiality on his end of the session. The provider was in a private home  office for the duration of this session.   The patient arrived on time for her Caregility session  Time Spent: 59 minutes, 2 PM to 2:59 PM  Treatment Type: Individual Therapy Reported Symptoms: anxiety, panic attacks The patient continues to present with physical pain and both his back and his knee.  He started with a new  physical therapist and told him that there was a lot of arthritis in both knees and or certain things he cannot do but they had him do that anyway and now for the past 3 days he has been in significant knee pain.  He has continued to walk because he knows that is good for him in various ways but says it has been painful.  His wife also was not feeling well but she is going back to the doctor next week to have an ablation and hopes that  will bring her some relief.  For the most part he has been at the last couple weeks reaching back out and hearing from some old friends as well as time with his son and son's family.  He says that his wife's cousin is coming down for a weekend soon and they enjoy time with her and that he will go to his brother's house sometime in the next few weeks.  He recognizes the need for people that he cares about in helping with his depression.  We will continue to work on coping skills for helping with his anxiety and the progression of his Lewy body dementia diagnosis as well as processing his grief. Mental Status Exam: Appearance:  Well Groomed  Behavior: Appropriate Motor: Pt. Reports some instability due to Lewey Body dementia Speech/Language:  Normal Rate Affect: Appropriate Mood: normal Thought process: normal Thought content:  WNL Sensory/Perceptual di sturbances:  WNL Orientation: oriented to person, place, and situation Attention: Good Concentration: Good Memory: Impaired short term Fund of knowledge:  Good Insight:  Good Judgment:  Good Impulse Control: Good  Risk Assessment: Danger to Self: No Self-injurious Behavior:  No Danger to Others: No Duty to Warn:no Physical Aggression / Violence:No  Access to Firearms a concern: No  Gang Involvement:No  Treatment p lan: Will employ cognitive behavioral therapy as well as grief and loss therapy for relief of anxiety and grief.  Goals of grief therapy or to have a healthier response to the loss of his daughter and less sadness as evidenced by patient report in therapy notes as well as to help him feel less overwhelmed by her loss.  Goals are to see a 50% reduction in grief symptoms over the next 6 months.  I will encourage the use of the patient tel ling his grief story about his daughter including encouraging him to bring pictures and memorabilia to help process his feelings including any feelings of guilt associated with her loss.  We will use cognitive behavioral therapy to help identify and change anxiety producing thoughts and behavior patterns so as to improve his ability to better manage anxiety and stress, and manage thoughts and worrisome thinking contributing to anxiety.   We will  also refresh and encourage dialectical behavior therapy distress tolerance and mindfulness skills with the intention of reducing anxiety by 50% over the next 6 months. Progress: 30% Interventions: Cognitive Behavioral Therapy  Diagnosis:PTSD, Bi-Polar D/O  Plan: F/U/ appointments scheduled weekly.  Lorrene CHRISTELLA Hasten, [facility]                                                                                                                    Lorrene CHRISTELLA Hasten, [facility]               Lorrene CHRISTELLA Hasten, [facility]               Lorrene CHRISTELLA Hasten, [facility]               Lorrene CHRISTELLA Hasten, [facility]  [place] Behavioral Health Counselor/Therapist Progress Note  Patient ID: Jaymir Struble, MRN: [id number],    Date: 04/01/2024 This session was held via video teletherapy. The patient consented to the video teletherapy and was located in his home during t his session. He is aware it is the responsibility of the patient to secure confidentiality on his end of the session. The provider was in a private home office for the duration of this session.   The patient arrived on time for her Caregility session  Time Spent: 57 minutes, 2 PM to 2:57 PM  Treatment Type: Individual Therapy Reported Symptoms: anxiety, panic attacks The patient is still having significant back pain.  He cannot  sit anywhere for very long and has to be careful where he sits.  There is still some unsteadiness although he did not report any falls over the past 2 weeks.  His biggest concern now  is for his wife.  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  She is meeting with her cardiologist again next week to see what possible treatment is for her.  He still thinks  there may be some issue with her diaphragm but she is meeting with the pulmonologist also.  He is doing what he can to help her.  His daughter's birthday is March 11 so he knows that is a difficult time for both he and his wife next week.  Typically they do something to remember her but because of the wife's current health issues they cannot go to [place] as they had planned so they are talking about what to do to remember and c elebrate her birthday.  We will continue to work on coping skills for helping with his anxiety and the progression of his Lewy body dementia diagnosis as well as processing his grief. Mental Status Exam: Appearance:  Well Groomed  Behavior: Appropriate Motor: Pt. Reports some instability due to Lewey Body dementia Speech/Language:  Normal Rate Affect: Appropriate Mood: normal Thought process: normal Thought content:  WN L Sensory/Perceptual disturbances:  WNL Orientation: oriented to person, place, and situation Attention: Good Concentration: Good Memory: Impaired short term Fund of knowledge:  Good Insight:  Good Judgment:  Good Impulse Control: Good  Risk Assessment: Danger to Self: No Self-injurious Behavior: No Danger to Others: No Duty to Warn:no Physical Aggression / Violence:No  Access to Firearms a concern: No  Terrea Papa lvement:No  Treatment plan: Will employ cognitive behavioral therapy as well as grief and loss therapy for relief of anxiety and grief.  Goals of grief therapy or to have a healthier response to the loss of his daughter and less sadness as evidenced by patient report in therapy notes as well as to help him feel less overwhelmed by her loss.  Goals are to see a 50%  reduction in grief symptoms over the next 6 months.  I will encourage th e use of the patient telling his grief story about his daughter including encouraging him to bring pictures and memorabilia to help process his feelings including any feelings of guilt associated with her loss.  We will use cognitive behavioral therapy to help identify and change anxiety producing thoughts and behavior patterns so as to improve his ability to better manage anxiety and stress, and manage thoughts and worrisome thinking c ontributing to anxiety.  We will also refresh and encourage dialectical behavior therapy distress tolerance and mindfulness  skills with the intention of reducing anxiety by 50% over the next 6 months. Progress: 30% Interventions: Cognitive Behavioral Therapy  Diagnosis:PTSD, Bi-Polar D/O  Plan: F/U/ appointments scheduled weekly.  Lorrene CHRISTELLA Hasten, [facility]                                                                                                                    Lorrene CHRISTELLA Hasten, [facility]  [place] Behavioral Health Counselor/Therapist Progress Note  Patient ID: Stephen Myers, MRN: [id number],    Date: 04/01/2024 This session was held via video teletherapy. The patient consented to the video teletherapy and was located in his home during this session. He is aware it is the responsibility of the patient to secure confidentiality on his end of the session. T he provider was in a private home office for the duration of this session.   The patient arrived on time for her Caregility session  Time Spent: 58 minutes, 2 PM to 2:58 PM  Treatment Type: Individual Therapy Reported Symptoms: anxiety, panic attacks A fairly difficult week because it was his daughter's birthday.  He said this year was more difficult than last year but he could not pinpoint why.  They did remember her about goi ng out to a nice restaurant which is something her daughter always wanted to do.  He said they were able to laugh and joke about funny things that his daughter had said or done which is not something they were able to do this time last year so he saw that as healthy grieving.  He knows that the time change threw things off a little bit for everybody and his wife still is not feeling well and his back has been bothering him.  He was not  able to go to his first round of physical therapy this week because his back was bothering him more.  He has been thinking a lot more about his daughter which he feels comfortable with.   He is thankful that it is getting warmer and staying light longer so he can get back to some of the coping skills such as fishing that he has done in the past.   We will continue to work on coping skills for helping with his anxiety and the progressi on of his Lewy body dementia diagnosis as well as processing his grief. Mental Status Exam: Appearance:  Well Groomed  Behavior: Appropriate Motor: Pt. 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 Speech/Language:  Normal Rate Affect: Appropriate Mood: normal Thought process: normal Thought content:  WNL Sensory/Perceptual disturbances:  WNL Orientation: oriented to person, place, and situation Attention: Good  Concentration: Good Memory: Impaired short term Fund of knowledge:  Good Insight:  Good Judgment:  Good Impulse Control: Good  Risk Assessment: Danger to Self: No Self-injurious Behavior: No Danger to Others: No Duty to Warn:no Physical Aggression / Violence:No  Access to Firearms a concern: No  Gang Involvement:No  Treatment plan: Will employ cognitive behavioral therapy as well as grief and loss therapy for relief  of anxiety and grief.  Goals of grief therapy or to have a healthier response to the loss of his daughter and less sadness as evidenced by patient report in therapy notes as well as to help him feel less overwhelmed by her loss.  Goals are to see a 50% reduction in grief symptoms over the next 6 months.  I will encourage the use of the patient telling his grief story about his daughter including encouraging him to bring pictures and me morabilia to help process his feelings including any feelings of guilt associated with her loss.  We will use cognitive behavioral therapy to help identify and change anxiety producing thoughts and behavior patterns so as to improve his ability to better manage anxiety and stress, and manage thoughts and worrisome thinking contributing to anxiety.  We will also refresh  and encourage dialectical behavior therapy distress  tolerance and mi ndfulness skills with the intention of reducing anxiety by 50% over the next 6 months. Progress: 30% Interventions: Cognitive Behavioral Therapy  Diagnosis:PTSD, Bi-Polar D/O  Plan: F/U/ appointments scheduled weekly.  Lorrene CHRISTELLA Hasten, [facility]                                                                                                                    Lorrene CHRISTELLA Hasten, [facility]               Lorrene CHRISTELLA Hasten, [facility]               Lorrene CHRISTELLA Hasten, [facility]  [place] Behavioral Health Counselor/Therapist Progress Note  Patient ID:  Jujhar Everett, MRN: [id number],    Date: 04/01/2024 This session was held via video teletherapy. The patient consented to the video teletherapy and was located in his home during this session. He is aware it is the responsibility of the patient to secure confidentiality on his end of the session. The provider was in a p rivate home office for the duration of this session.   The patient arrived on time for her Caregility session  Time Spent: 57 minutes, 2 PM to 2:57 PM  Treatment Type: Individual Therapy Reported Symptoms: anxiety, panic attacks The patient is still having significant back pain.  He cannot sit anywhere for very long and has to be careful where he sits.  There is still some unsteadiness although he did not report any falls over  the past 2 weeks.  His biggest concern now is for his wife.  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  She is meeting with her cardiologist again next week to see what possible treatment is for her.  He still thinks there may be some issue with her diaphragm but she is meeting with the pulmonologist also.  He is doing what he can to help her.  His daugh ter's birthday is March 11 so he knows that is a difficult time for both he and his wife next week.  Typically they do something to remember her but because of the wife's current health issues they cannot go to [place] as they had planned so they are talking about what to do to remember and celebrate her birthday.  We will continue to work on coping skills for helping with his anxiety and the progression of his Lewy body dementi a diagnosis as well as processing his grief. 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 Speech/Language:  Normal Rate Affect: Appropriate Mood: normal Thought process: normal Thought content:   WNL Sensory/Perceptual disturbances:  WNL Orientation: oriented to person, place, and situation Attention: Good Concentration: Good Memo ry: Impaired short term Fund of knowledge:  Good Insight:  Good Judgment:  Good Impulse Control: Good  Risk Assessment: Danger to Self: No Self-injurious Behavior: No Danger to Others: No Duty to Warn:no Physical Aggression / Violence:No  Access to Firearms a concern: No  Gang Involvement:No  Treatment plan: Will employ cognitive behavioral therapy as well as grief and loss therapy for relief of anxiety and grief.  [place] ls of grief therapy or to have a healthier response to the loss of his daughter and less sadness as evidenced by patient report in therapy notes as well as to help him feel less overwhelmed by her loss.  Goals are to see a 50% reduction in grief symptoms over the next 6 months.  I will encourage the use of the patient telling his grief story about his daughter including encouraging him to bring pictures and memorabilia to help process h is feelings including any feelings of guilt associated with her loss.  We will use cognitive behavioral therapy to help identify and change anxiety producing thoughts and behavior patterns so as to improve his ability to better manage anxiety and stress, and manage thoughts and worrisome thinking contributing to anxiety.  We will also refresh and encourage dialectical behavior therapy distress tolerance and mindfulness skills  with the i ntention of reducing anxiety by 50% over the next 6 months. Progress: 30% Interventions: Cognitive Behavioral Therapy  Diagnosis:PTSD, Bi-Polar D/O  Plan: F/U/ appointments scheduled weekly.  Lorrene CHRISTELLA Hasten, [facility]                                                                                                                    Lorrene CHRISTELLA Hasten,  [facility]  [place] Behavioral Health Counselor/Therapist Progress Note  Patient ID: Beryl am Uzziah Rigg, MRN: [id number],    Date: 04/01/2024 This session was held via video teletherapy. The patient consented to the video teletherapy and was located in his home during this session. He is aware it is the responsibility of the patient to secure confidentiality on his end of the session. The provider was in a private home office for the duration of this session.   The patient arrived on time for her Caregility session  Ti me Spent: 57 minutes, 2 PM to 2:57 PM  Treatment Type: Individual Therapy Reported Symptoms: anxiety, panic attacks  The patient reported that he had not been sleeping well so he did speak with his  psychiatrist about adjusting some meds.  Initially they adjusted too much up and he said he felt groggy for the entire next day but they have found a combination now where he feels like he is getting more rested and only feels a little dr gertrude shortly after waking up..  There have been a couple times where he stumbled and one of the times he felt down but did not get hurt the other times he was able to catch himself.  He is trying to stay as active as he can walking daily and fishing with his wife when he can.  His biggest anxiety now is his wife has an upcoming surgery on her diaphragm in 2 weeks so he knows that will be a fairly significant recovery time but also sees  the difficulties she is having now and wants her to get some relief..  His son and daughter-in-law will be supports for them also.  He continues to speak with friends and family members as encouragement.  He also feels that he is grieving appropriately writing to his daughter on a regular basis about the things that are going on in his and his wife's lives.  We will continue to work on coping skills for helping with his anxiety and th e progression of his Lewy body dementia diagnosis as well as processing his grief. 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 Speech/Language:  Normal Rate Affect: Appropriate Mood: normal Thought process: normal Thought content:  WNL Sensory/Perceptual disturbances:  WNL Orientation: oriented to person, place, and situation Atte ntion: Good Concentration: Good Memory: Impaired short term Fund of knowledge:  Good Insight:  Good Judgment:  Good Impulse Control: Good  Risk Assessment: Danger to Self: No Self-injurious Behavior: No Danger to Others: No Duty to Warn:no Physical Aggression / Violence:No  Access to Firearms a concern: No  Gang Involvement:No  Treatment plan: Will employ cognitive  behavioral therapy as well as grief and loss therapy  for relief of anxiety and grief.  Goals of grief therapy or to have a healthier response to the loss of his daughter and less sadness as evidenced by patient report in therapy notes as well as to help him feel less overwhelmed by her loss.  Goals are to see a 50% reduction in grief symptoms over the next 6 months.  I will encourage the use of the patient telling his grief story about his daughter including encouraging him to bring pict ures and memorabilia to help process his feelings including any feelings of guilt associated with her loss.  We will use cognitive behavioral therapy to help identify and change anxiety producing thoughts and behavior patterns so as to improve his ability  to better manage anxiety and stress, and manage thoughts and worrisome thinking contributing to anxiety.  We will also refresh and encourage dialectical behavior therapy distress toler ance and mindfulness skills with the intention of reducing anxiety by 50% over the next 6 months. Progress: 30% Interventions: Cognitive Behavioral Therapy  Diagnosis:PTSD, Bi-Polar D/O  Plan: F/U/ appointments scheduled weekly.  Lorrene CHRISTELLA Hasten, [facility]                                                                                                                    Lorrene CHRISTELLA Hasten, [facility]               Lorrene CHRISTELLA Hasten, [facility]               Lorrene CHRISTELLA Hasten, [facility]               Lorrene CHRISTELLA Hasten, [facility]               Lorrene CHRISTELLA Hasten, [facility]               Lorrene CHRISTELLA Hasten, [facility]               Lorrene CHRISTELLA Hasten, [facility]               Lorrene CHRISTELLA Hasten, [facility]  [place] [facility] Counselor/Therapist Progress Note  Patient ID: Torien Ramroop, MRN: [id number],    Date: 04/01/2024 Thi s session was held via video teletherapy. The patient consented to the video teletherapy and was located in his home during this session. He is aware it is the responsibility of the patient to secure confidentiality on his end of the session. The provider was in a private home office for the duration of this session.   The patient arrived on time for her Caregility session  Time Spent: 59 minutes, 2 PM to 2:59 PM  Treatment Type:  Individual Therapy Reported Symptoms: anxiety, panic attacks The patient said he jokingly said to his wife that he had not had any pain issues in the cervical area and then a few  days ago he started having pain in his neck and shoulders.  He now sits related to degenerative disk and has some consistent pain somewhere in his spine with that but jokingly says it just depends on Wednesday which part of his body is decides to show up and  hurt.  He is doing the best that he can.  He is still walking which he knows is good for him but does not have right now the strength to do as much in the morning as he has done in the past so he is denying that to 3 times a day for consistency.  He is still doing some positive things and that he is staying in touch with his brother and other family members.  He is reconnecting with friends from high school and he is which she is enjoy ing.  One of them has had a similar experience in that she lost her son in the same way the patient lost his daughter.  He said they did not think and Easter in which they set a place for his daughter at the table and put her picture there.  He initially had reservations about doing that he and his wife had made everyone feel as if she was really there with him.  He still feels that he is grieving in a healthy way.  He and his wife have  always taken a trip around both of their birthdays which are 2 days apart.  That is now close to his daughter died so they have started taking some of her rashes with him and sprinkling them where they go as a way of remembrance.  They are deciding what that looks like this year in July.  He does contract for safety having no thoughts of hurting himself or anyone else. We will continue to work on coping skills for helping with his a nxiety and the progression of his Lewy body dementia diagnosis as well as processing his grief. 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 Speech/Language:  Normal Rate Affect: Appropriate Mood: normal Thought process: normal Thought content:   WNL Sensory/Perceptual disturbances:  WNL Orientation: oriented to person, place, and si tuation Attention: Good Concentration: Good Memory: Impaired short term Fund of knowledge:  Good Insight:  Good Judgment:  Good Impulse Control: Good  Risk Assessment: Danger to Self: No Self-injurious Behavior: No Danger to Others: No Duty to Warn:no Physical Aggression / Violence:No  Access to Firearms a concern: No  Gang Involvement:No  Treatment plan: Will employ cognitive behavioral therapy as well as grief and  loss therapy for relief of anxiety and grief.  Goals of grief  therapy or to have a healthier response to the loss of his daughter and less sadness as evidenced by patient report in therapy notes as well as to help him feel less overwhelmed by her loss.  Goals are to see a 50% reduction in grief symptoms over the next 6 months.  I will encourage the use of the patient telling his grief story about his daughter including encouraging him  to bring pictures and memorabilia to help process his feelings including any feelings of guilt associated with her loss.  We will use cognitive behavioral therapy to help identify and change anxiety producing thoughts and behavior patterns so as to improve his ability to better manage anxiety and stress, and manage thoughts and worrisome thinking contributing to anxiety.  We will also refresh and encourage dialectical behavior therapy d istress tolerance and mindfulness skills with the intention of reducing anxiety by 50% over the next 6 months. Progress: 30% Interventions: Cognitive Behavioral Therapy  Diagnosis:PTSD, Bi-Polar D/O  Plan: F/U/ appointments scheduled weekly.  Lorrene CHRISTELLA Hasten, [facility]                                                                                                                    Lorrene CHRISTELLA Hasten,  [facility]  [place] [facility] Cou nselor/Therapist Progress Note  Patient ID: Marsh Heckler, MRN: [id number],    Date: 04/01/2024 This session was held via video teletherapy. The patient consented to the video teletherapy and was located in his home during this session. He is aware it is the responsibility of the patient to secure confidentiality on his end of the session. The provider was in a private home office for the duration of this session.   The patie nt arrived on time for her Caregility session  Time Spent: 58 minutes, 2 PM to 2:58 PM  Treatment Type: Individual Therapy Reported Symptoms: anxiety, panic attacks A fairly difficult week because it was his daughter's birthday.  He said this year was more  difficult than last year but he could not pinpoint why.  They did remember her about going out to a nice restaurant which is something her daughter always wanted to do.  He said  they were able to laugh and joke about funny things that his daughter had said or done which is not something they were able to do this time last year so he saw that as healthy grieving.  He knows that the time change threw things off a little bit for everybody and his wife still is not feeling well and his back has been bothering him.  He was not able to go to his first round of physical therapy this week because his back was bothering  him more.  He has been thinking a lot more about his daughter which he feels comfortable with.  He is thankful that it is getting warmer and staying light longer so he can get back to some of the coping skills such as fishing that he has done in the past.   We will continue to work on coping skills for helping with his anxiety and the progression of his Lewy body dementia diagnosis as well as processing his grief. 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 Speech/Language:  Normal Rate Affect: Appropriate Mood: normal Thought process: normal Thought content:  WNL Sensory/Perceptual disturbances:  WNL Orientation: oriented to person, place, and situation Attention: Good Concentration: Good Memory: Impaired short term Fund of knowledge:  Good Insight:   Good Judgment:  Good Impulse Control: Good  Risk Assessment: Danger to Self: No Self-injurious Behavior: No Danger to Others: No Duty to Warn:no Physical Aggression / Violence:No  Access to Firearms a concern: No  Gang Involvement:No  Treatment plan: Will employ cognitive behavioral therapy as well as grief and loss therapy for relief of anxiety and grief.  Goals of grief therapy or to have a healthier response to the loss  of  his daughter and less sadness as evidenced by patient report in therapy notes as well as to help him feel less overwhelmed by her loss.  Goals are to see a 50% reduction in grief symptoms over the next 6 months.  I will encourage the use of the patient telling his grief story about his daughter including encouraging him to bring pictures and memorabilia to help process his feelings including any feelings of guilt associated with her  loss.  We will use cognitive behavioral therapy to help identify and change anxiety producing thoughts and behavior patterns so as to improve his ability to better manage anxiety and stress, and manage thoughts and worrisome thinking contributing to anxiety.  We will also refresh and encourage dialectical behavior therapy  distress tolerance and mindfulness skills with the intention of reducing anxiety by 50% over the next 6 months. Pr ogress: 30% Interventions: Cognitive Behavioral Therapy  Diagnosis:PTSD, Bi-Polar D/O  Plan: F/U/ appointments scheduled weekly.  Lorrene CHRISTELLA Hasten, [facility]                                                                                                                   Lorrene CHRISTELLA Hasten, [facility]               C raig CHRISTELLA Hasten, [facility]               Lorrene CHRISTELLA Hasten, [facility]  [place] [facility] Counselor/Therapist Progress Note   Patient ID: Laterrance Nauta, MRN: [id number],    Date: 04/01/2024 This session was held via video teletherapy. The patient consented to the video teletherapy and was located in his home during this session. He is aware it is the responsibility of the patient to secure confidentiality on his end of the session. The provider was in a private home office for the duration of this session.   The patient arrived on time for her Caregil ity session  Time Spent: 1:05 PM until 2 PM, 55 minutes Treatment Type: Individual Therapy Reported Symptoms: anxiety, panic attacks The patient said he came in from his walk this morning and saw what he thought was some sort of tic from his wife.  It was not the first time it happened and he wonders if it might be tardive dyskinesia because of some new medications that she is on.  She has a call into the doctor right now and hopes  to hear something back soon.  They have narrowed down to the fact they think most of her discomfort in breathing is coming from the diaphragm and they are looking for alternatives for  treatment.  The patient continues to do those things which help him cope including walking, fishing when he can, spending time with his son and daughter-in-law and grandson.  He also has a way of continued coping with grief he is writing letters to his  daughters as a way of expressing his feelings and says that is very beneficial.  He appears to be grieving in a healthy way but he knows that he to year anniversary of her death is coming up and there is some grief there but says it is not overwhelming.  We will continue to work on coping skills for helping with his anxiety and the progression of his Lewy body dementia diagnosis as well as processing his grief. 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 Speech/Language:  Normal Rate Affect: Appropriate Mood: normal Thought process: normal Thought content:  WNL Sensory/Perceptual disturbances:  WNL Orientation: oriented to person, place, and situation Attention: Good Concentration: Good Memory: Impaired short term Fund of knowledge:  Good Insight:  Good  Judgment:  Good Impulse Control: Good  Risk Assessment: Danger to Self: No Self-injurious Behavior: No Danger to Others: No Duty to Warn:no Physical Aggression / Violence:No  Access to Firearms a concern: No  Gang Involvement:No  Treatment plan: Will employ cognitive behavioral therapy as well as grief and loss therapy for relief of anxiety and grief.  Goals of grief therapy or to have a healthier response to the loss of h is daughter and less sadness as evidenced by patient report in therapy notes as well as to help him feel less overwhelmed by her loss.  Goals are to see a 50% reduction in grief symptoms over the next 6 months.  I will encourage the use of the patient telling his grief story about his daughter including encouraging him to bring pictures and memorabilia to help process  his feelings including any feelings of guilt associated with her loss .  We will use cognitive behavioral therapy to help identify and change anxiety producing thoughts and behavior patterns so as to improve his ability to better manage anxiety and stress, and manage thoughts and worrisome thinking contributing to anxiety.  We will also refresh and encourage dialectical behavior therapy distress tolerance  and mindfulness skills with the intention of reducing anxiety by 50% over the next 6 months. Progres s: 30% Interventions: Cognitive Behavioral Therapy  Diagnosis:PTSD, Bi-Polar D/O  Plan: F/U/ appointments scheduled weekly.  Lorrene CHRISTELLA Hasten, [facility]                                                                                                                   Lorrene CHRISTELLA Hasten, [facility]  [place] [facility] Counselor/Therapist Progress Note  Patient ID: Kailon Treese, MRN: [id number],    Date: 04/01/2024 This session was  held via video teletherapy. The patient consented to the video teletherapy and was located in his home during this session. He is aware it is the responsibility of the patient to secure confidentiality on his end of the session. The provider was in a private home office for the duration of this session.   The patient arrived on time for her Caregility session  Time Spent: 59 minutes, 2 PM to 2:59 PM  Treatment Type: Individual T herapy Reported Symptoms: anxiety, panic attacks The patient continues to present with physical pain and both his back and his knee.  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  He has continued to walk because he knows that is good for him in va rious ways but says it has been painful.  His wife also was not feeling well but she is going back to the doctor next week to have an ablation and hopes that will bring her some relief.  For the most part he has been at the last couple weeks reaching back out and hearing from some old friends as well as time with his son and son's family.  He says that his wife's cousin is coming down for a weekend soon and they enjoy time with her and  that he will go to his brother's house sometime in the next few weeks.  He recognizes the need for people that he cares about in helping with his depression. We will continue to work  on coping skills for helping with his anxiety and the progression of his Lewy body dementia diagnosis as well as processing his grief. Mental Status Exam: Appearance:  Well Groomed  Behavior: Appropriate Motor: Pt. Reports some instability due to L ewey Body dementia Speech/Language:  Normal Rate Affect: Appropriate Mood: normal Thought process: normal Thought content:  WNL Sensory/Perceptual disturbances:  WNL Orientation: oriented to person, place, and situation Attention: Good Concentration: Good Memory: Impaired short term Fund of knowledge:  Good Insight:  Good Judgment:  Good Impulse Control: Good  Risk Assessment: Danger to Self: No Self-injurious  Behavior: No Danger to Others: No Duty to Warn:no Physical Aggression / Violence:No  Access to Firearms a concern: No  Gang Involvement:No  Treatment plan: Will employ cognitive behavioral therapy as well as grief and loss therapy for relief of anxiety and grief.  Goals of grief therapy or to have a healthier response to the loss of his daughter and less sadness as evidenced by patient report in therapy notes as well as to help hi m feel less overwhelmed by her loss.  Goals are to see a 50% reduction in grief symptoms over the next 6 months.  I will encourage the use of the patient telling his grief story about his daughter including encouraging him to bring pictures and memorabilia to help process his feelings including any feelings of guilt associated with her loss.  We will use cognitive behavioral therapy to help identify and change anxiety producing thoughts  and behavior patterns so as to improve his ability to better manage anxiety and stress, and manage thoughts and worrisome thinking contributing to anxiety.  We will also  refresh and encourage dialectical behavior therapy distress tolerance and mindfulness skills with the intention of reducing anxiety by 50% over the next 6 months. Progress: 30% Interventions:  Cognitive Behavioral Therapy  Diagnosis:PTSD, Bi-Polar D/O  Plan: F/U/  appointments scheduled weekly.  Lorrene CHRISTELLA Hasten, [facility]                                                                                                                   Lorrene CHRISTELLA Hasten, [facility]               Lorrene CHRISTELLA Hasten, [facility]               Lorrene CHRISTELLA Hasten, [facility]               Cr aig CHRISTELLA Hasten, [facility]  [place] [facility] Counselor/Therapist Progress Note  Patient ID: Trevelle Mcgurn, MRN: [id number],    Date: 04/01/2024 This session was held via video teletherapy. The patient consented to the video  teletherapy and was located in his home during this session. He is aware it is the responsibility of the patient to secure confidentiality on his end of the session. The provider was in a private home office for the duration of this session.   The patient arrived on time for her Caregility session  Time Spent: 57 minutes, 2  PM to 2:57 PM  Treatment Type: Individual Therapy Reported Symptoms: anxiety, panic attacks The patient is still having significant back pain.  He cannot sit anywhere for very long and has to be careful where he sits.  There is still some unsteadiness although he did not report any falls over the past 2 weeks.  His biggest concern now is for his wife.  She has been having some difficulty breathing and they did a test so they found a  small hole on the back side of her heart.  She is meeting with her cardiologist again next week to see what possible treatment is for her.  He still thinks there may be some issue with her diaphragm but she is meeting with the pulmonologist also.  He is doing what he can to help her.  His daughter's birthday is March 11 so he knows that is a difficult time for both he and his wife next week.  Typically they do something to remember h er but because of the wife's current health issues they cannot go to [place] as they had planned so they are talking about what to do to remember and celebrate her birthday.  We will continue to work on coping skills for helping with his anxiety and the progression of his Lewy body dementia diagnosis as well as processing his grief. Mental Status Exam: Appearance:  Well Groomed  Behavior: Appropriate Motor: Pt. Reports s ome instability due to Lewey Body dementia Speech/Language:  Normal Rate Affect: Appropriate Mood: normal Thought process: normal Thought content:  WNL Sensory/Perceptual disturbances:  WNL Orientation: oriented to person, place, and  situation Attention: Good Concentration: Good Memory: Impaired short term Fund of knowledge:  Good Insight:  Good Judgment:  Good Impulse Control: Good  Risk Assessment: Danger to Sel f: No Self-injurious Behavior: No Danger to Others: No Duty to Warn:no Physical Aggression / Violence:No  Access to Firearms a concern: No  Gang Involvement:No  Treatment plan: Will employ cognitive behavioral therapy as well as grief and loss therapy for relief of anxiety and grief.  Goals of grief therapy or to have a healthier response to the loss of his daughter and less sadness as evidenced by patient report in therapy not es as well as to help him feel less overwhelmed by her loss.  Goals are to see a 50% reduction in grief symptoms over the next 6 months.  I will encourage the use of the patient telling his grief story about his daughter including encouraging him to bring pictures and memorabilia to help process his feelings including any feelings of guilt associated with her loss.  We will use cognitive behavioral therapy to help identify and change an xiety producing thoughts and behavior patterns so as to improve his ability to better manage anxiety and stress, and manage thoughts and worrisome thinking contributing to anxiety.  We will also refresh and encourage dialectical behavior therapy distress tolerance and mindfulness  skills with the intention of reducing anxiety by 50% over the next 6 months. Progress: 30% Interventions: Cognitive Behavioral Therapy  Diagnosis:PTSD, Bi- Polar D/O  Plan: F/U/ appointments scheduled weekly.  Lorrene CHRISTELLA Hasten, [facility]                                                                                                                   Lorrene CHRISTELLA Hasten,  [facility]  [place] [facility] Counselor/Therapist Progress Note  Patient ID: Mantaj Chamberlin, MRN: [id number],    Date: 04/01/2024 This session was held via video teletherapy. The patient consented to the video teletherap y and was located in his home during this session. He is aware it is the responsibility of the patient to secure confidentiality on his end of the session. The provider was in a private home office for the duration of this session.   The patient arrived on time for her Caregility session  Time Spent: 58 minutes, 2 PM to 2:58 PM  Treatment Type: Individual Therapy Reported Symptoms: anxiety, panic attacks A fairly difficult week  because it was his daughter's birthday.  He said this year was more  difficult than last year but he could not pinpoint why.  They did remember her about going out to a nice restaurant which is something her daughter always wanted to do.  He said they were able to laugh and joke about funny things that his daughter had said or done which is not something they were able to do this time last year so he saw that as healthy grieving.  He kn ows that the time change threw things off a little bit for everybody and his wife still is not feeling well and his back has been bothering him.  He was not able to go to his first round of physical therapy this week because his back was bothering him more.  He has been thinking a lot more about his daughter which he feels comfortable with.  He is thankful that it is getting warmer and staying light longer so he can get back to some of  the coping skills such as fishing that he has done in the past.   We will continue to work on coping skills for helping with his anxiety and the progression of his Lewy body dementia diagnosis as well as processing his grief. Mental Status Exam: Appearance:  Well Groomed  Behavior: Appropriate Motor: Pt. Reports some instability due to Lewey Body dementia Speech/Language:  Normal Rate Affect: Appropriate Mood: normal Thou ght process: normal Thought content:  WNL Sensory/Perceptual disturbances:  WNL Orientation: oriented to person, place, and situation Attention: Good Concentration: Good Memory: Impaired short term Fund of knowledge:  Good Insight:  Good Judgment:  Good Impulse Control: Good  Risk Assessment: Danger to Self: No Self-injurious Behavior: No Danger to Others: No Duty to Warn:no Physical Aggression / Violence:No  A ccess to Firearms a concern: No  Gang Involvement:No  Treatment plan: Will employ cognitive behavioral therapy as well as grief and loss therapy for relief of anxiety and grief.  Goals of grief therapy or to have a healthier response to the loss of his  daughter and less sadness as evidenced by patient report in therapy notes as well as to help him feel less overwhelmed by her loss.  Goals are to see a 50% reduction in grief symptoms o ver the next 6 months.  I will encourage the use of the patient telling his grief story about his daughter including encouraging him to bring pictures and memorabilia to help process his feelings including any feelings of guilt associated with her loss.  We will use cognitive behavioral therapy to help identify and change anxiety producing thoughts and behavior patterns so as to improve his ability to better manage anxiety and stress, a nd manage thoughts and worrisome thinking contributing to anxiety.  We will also refresh and encourage dialectical behavior therapy  distress tolerance and mindfulness skills with the intention of reducing anxiety by 50% over the next 6 months. Progress: 30% Interventions: Cognitive Behavioral Therapy  Diagnosis:PTSD, Bi-Polar D/O  Plan: F/U/ appointments scheduled weekly.  Lorrene CHRISTELLA Hasten, [facility]                                                                                                                    Lorrene CHRISTELLA Hasten, [facility]               Lorrene CHRISTELLA Hasten, [facility]               Lorrene CHRISTELLA Hasten, [facility]  [facility] Counselor/Therapist Progress Note  Patient ID: Treyven Lafauci, MRN: [id number],    Date: 04/01/2024 This session was held via video teletherapy. The patient consented to the video teletherapy and was located in h is home during this session. He is aware it is the responsibility of the patient to secure confidentiality on his end of the session. The provider was in a private home office for the duration of this session.   The patient arrived on time for her Caregility session  Time Spent: 57 minutes, 2 PM to 2:57 PM  Treatment Type: Individual Therapy Reported Symptoms: anxiety, panic attacks The patient is still having significant back  pain.  He cannot sit anywhere for very long and has to be careful where he sits.  There is still some unsteadiness although he did not report any falls over the past 2 weeks.  His biggest concern now is for his wife.  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  She is meeting with her cardiologist again next week to see what possible treatment is for her.   He still thinks there may be some issue with her diaphragm but she is meeting with the pulmonologist also.  He is doing what he can to help her.  His daughter's birthday is March 11  so he knows that is a difficult time for both he and his wife next week.  Typically they do something to remember her but because of the wife's current health issues they cannot go to [place] as they had planned so they are talking about what to do t o remember and celebrate her birthday.  We will continue to work on coping skills for helping with his anxiety and the progression of his Lewy body dementia diagnosis as well as processing his grief. Mental Status Exam: Appearance:  Well Groomed  Behavior: Appropriate Motor: Pt. Reports some instability due to Lewey Body dementia Speech/Language:  Normal Rate Affect: Appropriate Mood: normal Thought process: normal Though t content:  WNL Sensory/Perceptual disturbances:  WNL Orientation: oriented to person, place, and situation Attention: Good Concentration: Good Memory: Impaired short term Fund of knowledge:  Good Insight:  Good Judgment:  Good Impulse Control: Good  Risk Assessment: Danger to Self: No Self-injurious Behavior: No Danger to Others: No Duty to Warn:no Physical Aggression / Violence:No  Access to Firearms a concern : No  Gang Involvement:No  Treatment plan: Will employ cognitive behavioral therapy as well as grief and loss therapy for relief of anxiety and grief.  Goals of grief therapy or to have a healthier response to the loss of his daughter and less sadness as evidenced by patient report in therapy notes as well as to help him feel less overwhelmed by her loss.  Goals are to see a 50% reduction in grief symptoms over the next 6 months.  I w ill encourage the use of the patient telling his grief story about his daughter including encouraging him to bring pictures and memorabilia to help process his feelings including any feelings of guilt associated with her loss.  We will use cognitive behavioral therapy to help identify and change anxiety producing thoughts and behavior patterns so as to improve his  ability to better manage anxiety and stress, and manage thoughts and worr isome thinking contributing to anxiety.  We will also refresh and encourage dialectical behavior therapy distress tolerance and mindfulness  skills with the intention of reducing anxiety by 50% over the next 6 months. Progress: 30% Interventions: Cognitive Behavioral Therapy  Diagnosis:PTSD, Bi-Polar D/O  Plan: F/U/ appointments scheduled weekly.  Lorrene CHRISTELLA Hasten, [facility]                                                                                                                    Lorrene CHRISTELLA Hasten, [facility]  [facility]  Counselor/Therapist Progress Note  Patient ID: Jayshawn Colston, MRN: [id number],    Date: 04/01/2024 This session was held via video teletherapy. The patient consented to the video teletherapy and was located in his home during this session. He is aware it is the responsibility of the patient to secure confidentiality on his end o f the session. The provider was in a private home office for the duration of this session.   The patient arrived on time for her Caregility session  Time Spent: 57 minutes, 2 PM to 2:57 PM  Treatment Type: Individual Therapy Reported Symptoms: anxiety, panic attacks  We will continue to work on coping skills for helping with his anxiety and the progression of his Lewy body dementia diagnosis as well as processing his grief. M ental Status Exam: Appearance:  Well Groomed  Behavior: Appropriate Motor: Pt. Reports some instability due to Lewey Body dementia Speech/Language:  Normal Rate Affect: Appropriate Mood: normal Thought process: normal Thought content:  WNL Sensory/Perceptual disturbances:  WNL Orientation: oriented to person, place, and situation Attention: Good Concentration: Good Memory: Impaired short term Fund of knowledge:  Go od Insight:  Good Judgment:  Good Impulse Control: Good  Risk Assessment: Danger to Self: No Self-injurious Behavior: No Danger to Others: No Duty to Warn:no Physical Aggression / Violence:No  Access to Firearms a concern: No  Gang Involvement:No  Treatment plan: Will employ cognitive behavioral therapy as well as grief and loss therapy for relief of anxiety and grief.  Goals of grief therapy or to have a healthier resp onse to the loss of his daughter and less sadness as evidenced by patient report in therapy notes as well as to help him feel less overwhelmed by her loss.  Goals are to see a 50% reduction in grief symptoms over the next 6 months.  I will encourage the use of the patient telling his  grief story about his daughter including encouraging him to bring pictures and memorabilia to help process his feelings including any feelings of guilt ass ociated with her loss.  We will use cognitive behavioral therapy to help identify and change anxiety producing thoughts and behavior patterns so as to improve his ability to better manage anxiety and stress, and manage thoughts and worrisome thinking contributing to anxiety.  We will also refresh and encourage dialectical behavior therapy distress tolerance and mindfulness skills with the intention of reducing anxiety by 50% over the ne xt 6 months. Progress: 30% Interventions: Cognitive Behavioral Therapy  Diagnosis:PTSD, Bi-Polar D/O  Plan: F/U/ appointments scheduled weekly.  Lorrene CHRISTELLA Hasten, [facility]                                                                                                                   Lorrene CHRISTELLA Hasten, [facility]                Lorrene CHRISTELLA Hasten, [facility]               Lorrene CHRISTELLA Hasten, [facility]  Lorrene CHRISTELLA Hasten, [facility]               Lorrene CHRISTELLA Hasten, [facility]               Lorrene CHRISTELLA Hasten, [facility]               Lorrene CHRISTELLA Hasten, [facility]  [facility] Counselor/Therapist Progress Note  Patient ID: Eliud Polo, MRN: [id number],    Date: 04/01/2024 This session was held via video teletherapy. The patient consented to the video teletherapy and was located in his home during this session . He is aware it is the responsibility of the patient to secure confidentiality on his end of the session. The provider was in a private home office for the duration of this session.   The patient arrived on time for her Caregility session  Time Spent: 39 minutes, 2 PM to 2:39 PM  Treatment Type: Individual Therapy Reported Symptoms: anxiety, panic attacks The patient was not feeling well today.  He started feeling really earli er in the week having a fever bad headaches body aches.  He felt a little better yesterday but feels a little bit of a resurgence in headaches and body aches and is running a low-grade fever.  His wife is feeling some of the same symptoms.  For the most part he says he is doing well.  They did increase his memory medication because they were starting to see some increasing decline in short-term memory and he said until recently his long -term memory had been good but he was starting to see a difference in how he used to markers to remember what happened at certain times historically.  He feels the medication has helped  and has brightened his mood a little also.  He is still trying to walk every day and fish when he can and has enjoyed watching the [employer] as a good distraction.  He reports that he feels he is managing mood fairly well.  He has an appoin tment in 2 weeks.  He does contract for safety having no thoughts of hurting himself or anyone else. We will continue to work on coping skills for helping with his anxiety and the progression of his Lewy body dementia diagnosis as well as processing his grief. Mental Status Exam: Appearance:  Well Groomed  Behavior: Appropriate Motor: Pt. Reports some instability due to Lewey Body dementia Speech/Language:  Normal Rate Affe ct: Appropriate Mood: normal Thought process: normal Thought content:  WNL Sensory/Perceptual disturbances:  WNL Orientation: oriented to person, place, and situation Attention: Good Concentration: Good Memory: Impaired short term Fund of knowledge:  Good Insight:  Good Judgment:  Good Impulse Control: Good  Risk Assessment: Danger to Self: No Self-injurious Behavior: No Danger to Others: No Duty to Warn:no Ph ysical Aggression / Violence:No  Access to Firearms a concern: No  Gang Involvement:No  Treatment plan: Will employ cognitive behavioral therapy as well as grief and loss therapy for relief of anxiety and grief.  Goals of grief therapy or to have a healthier response to the loss of his daughter and less sadness as evidenced by patient report in therapy notes as well as to help him feel less overwhelmed by her loss.  Goals are to see  a 50% reduction in grief symptoms over the next 6 months.  I will encourage the use of the patient telling his grief story about his daughter including encouraging him to bring pictures and memorabilia to help process his feelings including any feelings of guilt associated with her loss.  We will use cognitive behavioral therapy to help identify and change anxiety  producing thoughts and behavior patterns so as to improve his ability to  better manage anxiety and stress, and manage thoughts and worrisome thinking contributing to anxiety.  We will also refresh  and encourage dialectical behavior therapy distress tolerance and mindfulness skills with the intention of reducing anxiety by 50% over the next 6 months. Progress: 30% Interventions: Cognitive Behavioral Therapy  Diagnosis:PTSD, Bi-Polar D/O  Plan: F/U/ appointments scheduled weekly.  Lorrene CHRISTELLA Hasten, [facility]                                                                                                                    Lorrene CHRISTELLA Hasten, [facility]  [facility] Counselor/Therapist Progress Note  Patient ID: Carnie Bruemmer, MRN: [id number],    Date: 04/01/2024 This session was held via video teletherapy. The patient consented to the video teletherapy and was located in his home during this session. He is aware it is the respon sibility of the patient to secure confidentiality on his end of the session. The provider was in a private home office for the duration of this session.   The patient arrived on time for her Caregility session  Time Spent: 58 minutes, 2 PM to 2:58 PM  Treatment Type: Individual Therapy Reported Symptoms: anxiety, panic attacks A fairly difficult week because it was his daughter's birthday.  He said this year was more difficult  than last year but he could not pinpoint why.  They did remember her about going out to a nice restaurant which is something her daughter always wanted to do.  He said they were able to laugh and joke about funny things that his daughter had said or done which is not something they were able to do this time last year so he saw that as healthy grieving.  He knows that the time change threw things off a little bit for everybody and his wi fe still is not feeling well and his back has been bothering him.  He was not able to go to his first round of physical therapy this week because his back was bothering him more.  He has been thinking a lot more about his daughter which he feels comfortable with.  He is thankful that it is getting warmer and staying light longer so he can get back to some of the coping skills such as fishing that he has done in the past.   We will co ntinue to work on coping skills for helping with his anxiety and the progression of his Lewy body dementia diagnosis as well as processing his grief. Mental Status Exam: Appearance:   Well Groomed  Behavior: Appropriate Motor: Pt. Reports some instability due to Lewey Body dementia Speech/Language:  Normal Rate Affect: Appropriate Mood: normal Thought process: normal Thought content:  WNL Sensory/Perceptual disturbances:   WNL Orientation: oriented to person, place, and situation Attention: Good Concentration: Good Memory: Impaired short term Fund of knowledge:  Good Insight:  Good Judgment:  Good Impulse Control: Good  Risk Assessment: Danger to Self: No Self-injurious Behavior: No Danger to Others: No Duty to Warn:no Physical Aggression / Violence:No  Access to Firearms a concern: No  Gang Involvement:No  Treatment plan: Will em ploy cognitive behavioral therapy as well as grief and loss therapy for relief of anxiety and grief.  Goals of grief therapy or to have a healthier response to the loss of his daughter and less sadness as evidenced by patient report in therapy notes as well as to help him feel less overwhelmed by her loss.  Goals are to see a 50% reduction in grief symptoms over the next 6 months.  I will encourage the use of the patient telling his kathryne commons story about his daughter including encouraging him to bring pictures and memorabilia to help process his feelings including any feelings of guilt associated with her loss.  We will use cognitive behavioral therapy to help identify and change anxiety producing thoughts and behavior patterns so as to improve his ability to better manage anxiety and stress, and manage thoughts and worrisome thinking contributing to anxiety.  We will als o refresh and encourage dialectical behavior therapy distress  tolerance and mindfulness skills with the intention of reducing anxiety by 50% over the next 6 months. Progress: 30% Interventions: Cognitive Behavioral Therapy  Diagnosis:PTSD, Bi-Polar D/O  Plan: F/U/ appointments scheduled weekly.  Lorrene CHRISTELLA Hasten,  [facility]                                                                                                                    Lorrene CHRISTELLA Hasten, [facility]               Lorrene CHRISTELLA Hasten, [facility]               Lorrene CHRISTELLA Hasten, [facility]  [facility] Counselor/Therapist Progress Note  Patient ID: Michell Kader, MRN: [id number],    Date: 04/01/2024 This session was held via video teletherapy. The patient consented to the video teletherapy and was located in his home during this session. He is aware it is the responsibility of the patient to secure  confidentiality on his end of the session. The provider was in a  private home office for the duration of this session.   The patient arrived on time for her Caregility session  Time Spent: 1:05 PM until 2 PM, 55 minutes Treatment Type: Individual Therapy Reported Symptoms: anxiety, panic attacks The patient said he came in from his walk this morning and saw what he thought was some sort of tic from his wife.  It was not the fir st time it happened and he wonders if it might be tardive dyskinesia because of some new medications that she is on.  She has a call into the doctor right now and hopes to hear something back soon.  They have narrowed down to the fact they think most of her discomfort in breathing is coming from the diaphragm and they are looking for alternatives for treatment.  The patient continues to do those things which help him cope including wa lking, fishing when he can, spending time with his son and daughter-in-law and grandson.  He also has a way of continued coping with grief he is writing letters to his daughters as a way of expressing his feelings and says that is very beneficial.  He appears to be grieving in a healthy way but he knows that he to year anniversary of her death is coming up and there is some grief there but says it is not overwhelming.  We will continu e to work on Pharmacologist for helping with his anxiety and the progression of his Lewy body dementia diagnosis as well as processing his grief. Mental Status Exam: Appearance:  Well Groomed  Behavior: Appropriate Motor: Pt. Reports some instability due to Lewey Body dementia Speech/Language:  Normal Rate Affect: Appropriate Mood: normal Thought process: normal Thought content:  WNL Sensory/Perceptual disturbances:  WN L Orientation: oriented to person, place, and situation Attention: Good Concentration: Good Memory: Impaired short term Fund of knowledge:  Good Insight:  Good Judgment:  Good Impulse Control: Good  Risk Assessment: Danger to Self:  No Self-injurious Behavior: No Danger to Others: No Duty to Warn:no Physical Aggression / Violence:No  Access to Firearms a concern: No  Gang Involvement:No  Treatment plan: Will employ  cognitive behavioral therapy as well as grief and loss therapy for relief of anxiety and grief.  Goals of grief therapy or to have a healthier response to the loss of his daughter and less sadness as evidenced by patient report in therapy notes as well as to help him feel less overwhelmed by her loss.  Goals are to see a 50% reduction in grief symptoms over the next 6 months.  I will encourage the use of the patient telling his grief st ory about his daughter including encouraging him to bring pictures and memorabilia to help process his feelings including any feelings of guilt associated with her loss.  We will use cognitive behavioral therapy to help identify and change anxiety producing thoughts and behavior patterns so as to improve his ability to better manage anxiety and stress, and manage thoughts and worrisome thinking contributing to anxiety.  We will also ref resh and encourage dialectical behavior therapy distress tolerance  and mindfulness skills with the intention of reducing anxiety by 50% over the next 6 months. Progress: 30% Interventions: Cognitive Behavioral Therapy  Diagnosis:PTSD, Bi-Polar D/O  Plan: F/U/ appointments scheduled weekly.  Lorrene CHRISTELLA Hasten, [facility]                                                                                                                    Lorrene CHRISTELLA Hasten,  [facility]  [facility] Counselor/Therapist Progress Note  Patient ID: Zaccary Creech, MRN: [id number],    Date: 04/01/2024 This session was held via video teletherapy. The patient consented to the video teletherapy and was located in his home during this session. He is aware it is the responsibility of the patient to secure confidentiality on his end of the session. The provider was in a private home office  for the duration of this session.   The patient arrived on time for her Caregility session  Time Spent: 59 minutes, 2 PM to 2:59 PM  Treatment Type: Individual Therapy Reported Symptoms: anxiety, panic attacks The patient continues to present with physical pain and both his back and his knee.  He  started with a new physical therapist and told him that there was a lot of arthritis in both knees and or certain things he cannot do  but they had him do that anyway and now for the past 3 days he has been in significant knee pain.  He has continued to walk because he knows that is good for him in various ways but says it has been painful.  His wife also was not feeling well but she is going back to the doctor next week to have an ablation and hopes that will bring her some relief.  For the most part he has been at the last couple weeks reaching back out and hearing  from some old friends as well as time with his son and son's family.  He says that his wife's cousin is coming down for a weekend soon and they enjoy time with her and that he will go to his brother's house sometime in the next few weeks.  He recognizes the need for people that he cares about in helping with his depression. We will continue to work on coping skills for helping with his anxiety and the progression of his Lewy body demen tia diagnosis as well as processing his grief. Mental Status Exam: Appearance:  Well Groomed  Behavior: Appropriate Motor: Pt. Reports some instability due to Lewey Body dementia Speech/Language:  Normal Rate Affect: Appropriate Mood: normal Thought process: normal Thought content:  WNL Sensory/Perceptual disturbances:  WNL Orientation: oriented to person, place, and situation Attention: Good Concentration: Good Me mory: Impaired short term Fund of knowledge:  Good Insight:  Good Judgment:  Good Impulse Control: Good  Risk Assessment: Danger to Self: No Self-injurious Behavior: No Danger to Others: No Duty to Warn:no Physical Aggression / Violence:No  Access to Firearms a concern: No  Gang Involvement:No  Treatment plan: Will employ cognitive behavioral therapy as well as grief and loss therapy for relief of anxiety and grief.  G oals of grief therapy or to have a healthier  response to the loss of his daughter and less sadness as evidenced by patient report in therapy notes as well as to help him feel less overwhelmed by her loss.  Goals are to see a 50% reduction in grief symptoms over the next 6 months.  I will encourage the use of the patient telling his grief story about his daughter including encouraging him to bring pictures and memorabilia to help process  his feelings including any feelings of guilt associated with her loss.  We will use cognitive behavioral therapy to help identify and change anxiety producing thoughts and behavior patterns so as to improve his ability to better manage anxiety and stress, and manage thoughts and worrisome thinking contributing to anxiety.  We will also  refresh and encourage dialectical behavior therapy distress tolerance and mindfulness skills with the  intention of reducing anxiety by 50% over the next 6 months. Progress: 30% Interventions: Cognitive Behavioral Therapy  Diagnosis:PTSD, Bi-Polar D/O  Plan: F/U/ appointments scheduled weekly.  Lorrene CHRISTELLA Hasten, [facility]                                                                                                                    Lorrene CHRISTELLA Hasten, [facility]               Lorrene CHRISTELLA Hasten, [facility]               Lorrene CHRISTELLA Hasten, [facility]               Lorrene CHRISTELLA Hasten, [facility]  [facility] Counselor/Therapist Progress Note  Patient ID: Fidel Caggiano, MRN: [id number],    Date: 04/01/2024 This session was held via video teletherapy. The patient consented to the video teletherapy and was located in his home during this session. He is aware it is the responsibility of the patient to secure confidentiality on his end of the sessio n. The provider was in a private home office for the duration of this session.   The patient arrived on time for her Caregility session  Time Spent: 57 minutes, 2 PM to 2:57 PM  Treatment Type: Individual Therapy Reported Symptoms: anxiety, panic attacks The patient is still having significant back pain.  He cannot sit anywhere for very long and has to be careful where he sits.  There is still some unsteadiness although he did  not report any falls over the past 2 weeks.  His biggest concern now is for his wife.  She has been having some difficulty breathing and they did a test so they found a small hole on  the back side of her heart.  She is meeting with her cardiologist again next week to see what possible treatment is for her.  He still thinks there may be some issue with her diaphragm but she is meeting with the pulmonologist also.  He is doing what he can  to help her.  His daughter's birthday is March 11 so he knows that is a difficult time for both he and his wife next week.  Typically they do something to remember her but because of the wife's current health issues they cannot go to [place] as they had planned so they are talking about what to do to remember and celebrate her birthday.  We will continue to work on coping skills for helping with his anxiety and the progressio n of his Lewy body dementia diagnosis as well as processing his grief. Mental Status Exam: Appearance:  Well Groomed  Behavior: Appropriate Motor: Pt. Reports some instability due to Lewey Body dementia Speech/Language:  Normal Rate Affect: Appropriate Mood: normal Thought process: normal Thought content:  WNL Sensory/Perceptual disturbances:  WNL Orientation: oriented to person, place, and situation Attention: Good  Concentration: Good Memory: Impaired short term Fund of knowledge:  Good Insight:  Good Judgment:  Good Impulse Control: Good  Risk Assessment: Danger to Self: No Self-injurious Behavior: No Danger to Others: No Duty to Warn:no Physical Aggression / Violence:No  Access to Firearms a concern: No  Gang Involvement:No  Treatment plan: Will employ cognitive behavioral therapy as well as grief and loss therapy for relief  of anxiety and grief.  Goals of grief therapy or to have a healthier response to the loss of his daughter and less sadness as evidenced by patient report in therapy notes as well as to help him feel less overwhelmed by her loss.  Goals are to see a 50% reduction in grief symptoms over the next 6 months.  I will encourage the use of the patient telling his grief  story about his daughter including encouraging him to bring pictures and mem orabilia to help process his feelings including any feelings of guilt associated with her loss.  We will use cognitive behavioral therapy to help identify and change anxiety producing thoughts and behavior patterns so as to improve his ability to better manage anxiety and stress, and manage thoughts and worrisome thinking contributing to anxiety.  We will also refresh and encourage dialectical behavior therapy distress tolerance and min dfulness  skills with the intention of reducing anxiety by 50% over the next 6 months. Progress: 30% Interventions: Cognitive Behavioral Therapy  Diagnosis:PTSD, Bi-Polar D/O  Plan: F/U/ appointments scheduled weekly.  Lorrene CHRISTELLA Hasten, [facility]                                                                                                                    Lorrene CHRISTELLA Hasten, [facility]  [facility] Counselor/Therapist Progress  Note  Patient ID: Alaster Asfaw, MRN: [id number],    Date: 04/01/2024 This session was held via video teletherapy. The patient consented to the video teletherapy and was located in his home during this session. He is aware it is the responsibility of the patient to secure confidentiality on his end of the session. The provider was in a private home office for the duration of this session.   The patient arrived on time for he r Caregility session  Time Spent: 58 minutes, 2 PM to 2:58 PM  Treatment Type: Individual Therapy Reported Symptoms: anxiety, panic attacks A fairly difficult week because it was his daughter's birthday.  He said this year was more difficult than last year but he could not pinpoint why.  They did remember her about going out to a nice restaurant which is something her daughter always wanted to do.  He said they were able to laugh a nd joke about funny things that his daughter had said or done which is not something they were able to do this time last year so he saw that as healthy grieving.  He knows that the time change threw things off a little bit for everybody and his wife still is not feeling well and his back has been bothering him.  He was not able to go to his first round of physical therapy this week because his back was bothering him more.  He has been t hinking a lot more about his daughter which he feels comfortable with.  He is thankful that it is getting warmer and staying light longer so he can get back to some of the coping skills  such as fishing that he has done in the past.   We will continue to work on coping skills for helping with his anxiety and the progression of his Lewy body dementia diagnosis as well as processing his grief. 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 Speech/Language:  Normal Rate Affect: Appropriate Mood: normal Thought process: normal Thought content:  WNL Sensory/Perceptual disturbances:  WNL Orientation: oriented to person, place, and situation Attention: Good Concentration: Good Memory: Impaired short term Fund of knowledge:  Good Insight:  Good Judgment:  Good Im pulse Control: Good  Risk Assessment: Danger to Self: No Self-injurious Behavior: No Danger to Others: No Duty to Warn:no Physical Aggression / Violence:No  Access to Firearms a concern: No  Gang Involvement:No  Treatment plan: Will employ cognitive behavioral therapy as well as grief and loss therapy for relief of anxiety and grief.  Goals of grief therapy or to have a healthier response to the loss of his daughter and less  sadness as evidenced by patient report in therapy notes as well as to help him feel less overwhelmed by her loss.  Goals are to see a 50% reduction in grief symptoms over the next 6 months.  I will encourage the use of the patient telling his grief story about his daughter including encouraging him to bring pictures and memorabilia to help process his feelings including any feelings of guilt associated with her loss.  We will use cogni tive behavioral therapy to help identify and change anxiety producing thoughts and behavior patterns so as to improve his ability to better manage anxiety and stress, and manage thoughts and worrisome thinking contributing to anxiety.  We will also refresh and encourage dialectical behavior therapy distress  tolerance and mindfulness skills with the intention of  reducing anxiety by 50% over the next 6 months. Progress: 30% Intervention s: Cognitive Behavioral Therapy  Diagnosis:PTSD, Bi-Polar D/O  Plan: F/U/ appointments scheduled weekly.  Lorrene CHRISTELLA Hasten, [facility]                                                                                                                   Lorrene CHRISTELLA Hasten, [facility]               Lorrene CHRISTELLA Hasten, [facility]                Lorrene CHRISTELLA Hasten, [facility]  [facility] Counselor/Therapist Progress Note  Patient ID: Rhoderick Farrel, MRN: [id number],    Date: 04/01/2024 This session was held via video teletherapy. The  patient consented to the video teletherapy and was located in his home during this session. He is aware it is the responsibility of the patient to secure confidentiality on his end of the session. The provider was in a private home office for the duration of this session.   The patient arrived on time for her Caregility session   Time Spent: 57 minutes, 2 PM to 2:57 PM  Treatment Type: Individual Therapy Reported Symptoms: anxiety, panic attacks The patient is still having significant back pain.  He cannot sit anywhere for very long and has to be careful where he sits.  There is still some unsteadiness although he did not report any falls over the past 2 weeks.  His biggest concern now is for his wife.  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  She is meeting with her cardiologist again next week to see what possible treatment is for her.  He still thinks there may be some issue with her diaphragm but she is meeting with the pulmonologist also.  He is doing what he can to help her.  His daughter's birthday is March 11 so he knows that is a difficult time for both he and his wife next week.  Typically they  do something to remember her but because of the wife's current health issues they cannot go to [place] as they had planned so they are talking about what to do to remember and celebrate her birthday.  We will continue to work on coping skills for helping with his anxiety and the progression of his Lewy body dementia diagnosis as well as processing his grief. Mental Status Exam: Appearance:  Well Groomed  Behavior: Approp riate Motor: Pt. Reports some instability due to Lewey Body dementia Speech/Language:  Normal Rate Affect: Appropriate Mood: normal Thought process: normal Thought content:  WNL Sensory/Perceptual disturbances:  WNL Orientation: oriented to person, place, and  situation Attention: Good Concentration: Good Memory: Impaired short term Fund of knowledge:  Good Insight:  Good Judgment:  Good Impulse Control: Good  Risk  Assessment: Danger to Self: No Self-injurious Behavior: No Danger to Others: No Duty to Warn:no Physical Aggression / Violence:No  Access to Firearms a concern: No  Gang Involvement:No  Treatment plan: Will employ cognitive behavioral therapy as well as grief and loss therapy for relief of anxiety and grief.  Goals of grief therapy or to have a healthier response to the loss of his daughter and less sadness as evidenced by pa tient report in therapy notes as well as to help him feel less overwhelmed by her loss.  Goals are to see a 50% reduction in grief symptoms over the next 6 months.  I will encourage the use of the patient telling his grief story about his daughter including encouraging him to bring pictures and memorabilia to help process his feelings including any feelings of guilt associated with her loss.  We will use cognitive behavioral therapy to  help identify and change anxiety producing thoughts and behavior patterns so as to improve his ability to better manage anxiety and stress, and manage thoughts and worrisome thinking contributing to anxiety.  We will also refresh and encourage dialectical behavior therapy distress tolerance and mindfulness  skills with the intention of reducing anxiety by 50% over the next 6 months. Progress: 30% Interventions: Cognitive Behavioral The rapy  Diagnosis:PTSD, Bi-Polar D/O  Plan: F/U/ appointments scheduled weekly.  Lorrene CHRISTELLA Hasten, [facility]                                                                                                                   Lorrene CHRISTELLA Hasten, [facility]  [facility] Counselor/Therapist Progress Note  Patient ID: Shiv Shuey, MRN: [id number],    Date: 04/01/2024 This session was held via video teletherapy. The patient consen ted to the video teletherapy and was located in his home during this session. He is aware it is the responsibility of the patient to secure confidentiality on his end of the session. The provider was in a private home office for the duration of this session.   The patient arrived on time for her Caregility session  Time Spent: 57 minutes, 2 PM to 2:57 PM  Treatment Type: Individual Therapy Reported Symptoms: anxiety, panic attac ks That was a situation earlier in the week when the patient's neighbor was banging on his back door and window.  There had been situations where the same neighbor had said things and  use profanity directed at his wife feeling that the patient and his wife and let their dog use the bathroom in his yard.  The patient said they are very careful because her dog is old and did not think that happen but did not appreciate how they were hand led things.  He said he got angry very quickly and did not handle it the way that he wanted to.  The situation has been resolved but the patient has had very clear boundaries with his neighbor. His wife continues to progress well from her surgery.  Physically the patient is doing fairly well.  He had to see one of his brothers this week.  He is spending time with his son and son's family.  He has gotten some retirement/insurance things  works out for the most part and is glad to have that settled. We will continue to work on coping skills for helping with his anxiety and the progression of his Lewy body dementia diagnosis as well as processing his grief. Mental Status Exam: Appearance:  Well Groomed  Behavior: Appropriate Motor: Pt. Reports some instability due to Lewey Body dementia Speech/Language:  Normal Rate Affect: Appropriate Mood: normal Thought p rocess: normal Thought content:  WNL Sensory/Perceptual disturbances:  WNL Orientation: oriented to person, place, and situation Attention: Good Concentration: Good Memory: Impaired short term Fund of knowledge:  Good Insight:  Good Judgment:  Good Impulse Control: Good  Risk Assessment: Danger to Self: No Self-injurious Behavior: No Danger to Others: No Duty to Warn:no Physical Aggression / Violence:No  Access  to Firearms a concern: No  Gang Involvement:No  Treatment plan: Will employ cognitive behavioral therapy as well as grief and loss therapy for relief of anxiety and grief.  Goals of grief therapy or to have a healthier response to the loss of his daughter and less sadness as evidenced by patient report in therapy notes as well as to help him feel less overwhelmed  by her loss.  Goals are to see a 50% reduction in grief symptoms over t he next 6 months.  I will encourage the use of the patient telling his grief story about his daughter including encouraging him to bring pictures and memorabilia to help process his feelings including any feelings of guilt associated with her loss.  We will use cognitive behavioral therapy to help identify and change anxiety producing thoughts and behavior patterns so as to improve his ability to better manage anxiety and stress, and ma nage thoughts and worrisome thinking contributing to anxiety.  We will also refresh and encourage dialectical behavior therapy distress tolerance and mindfulness skills with the intention of reducing anxiety by 50% over  the next 6 months. Progress: 30% Interventions: Cognitive Behavioral Therapy  Diagnosis:PTSD, Bi-Polar D/O  Plan: F/U/ appointments scheduled weekly.  Lorrene CHRISTELLA Hasten, [facility]                                                                                                                    Lorrene CHRISTELLA Hasten, [facility]               Lorrene CHRISTELLA Hasten, [facility]               Lorrene CHRISTELLA Hasten, [facility]               Lorrene CHRISTELLA Hasten, [facility]               Lorrene CHRISTELLA Hasten, [facility]                Lorrene CHRISTELLA Hasten, [facility]               Lorrene CHRISTELLA Hasten, [facility]               Lorrene CHRISTELLA Hasten, [facility]               Lorrene CHRISTELLA Hasten, [facility]               Lorrene CHRISTELLA Hasten, [facility]  [facility] Counselor/Therapist Progress Note  Patient ID: Joseangel Nettleton, MRN: [id number],    Date: 04/08/2024 This session was held via video teletherapy. The patient consented to the video teletherapy and was located in his home during this session. He is aware it is the responsibility of the pa tient to secure confidentiality on his end of the session. The provider was in a private home office for the duration of this session.   The patient arrived on time for her Caregility session  Time Spent: 59 minutes, 2 PM to 2:59 PM  Treatment Type: Individual Therapy Reported Symptoms: anxiety, panic attacks The patient said he jokingly said to his wife that he had not had any pain issues in the cervical area and then a few da ys ago he started having pain in his neck and shoulders.  He now sits related to degenerative disk and has some consistent pain somewhere in his spine with that but jokingly says it  just depends on Wednesday which part of his body is decides to show up and hurt.  He is doing the best that he can.  He is still walking which he knows is good for him but does not have right now the strength to do as much in the morning as he has done in th e past so he is denying that to 3 times a day for consistency.  He is still doing some positive things and that he is staying in touch with his brother and other family members.  He is reconnecting with friends from high school and he is which she is enjoying.  One of them has had a similar experience in that she lost her son in the same way the patient lost his daughter.  He said they did not think and Easter in which they set a place  for his daughter at the table and put her picture there.  He initially had reservations about doing that he and his wife had made everyone feel as if she was really there with him.  He still feels that he is grieving in a healthy way.  He and his wife have always taken a trip around both of their birthdays which are 2 days apart.  That is now close to his daughter died so they have started taking some of her rashes with him and sprinkli ng them where they go as a way of remembrance.  They are deciding what that looks like this year in July.  He does contract for safety having no thoughts of hurting himself or anyone else. We will continue to work on coping skills for helping with his anxiety and the progression of his Lewy body dementia diagnosis as well as processing his grief. Mental Status Exam: Appearance:  Well Groomed  Behavior: Appropriate Motor: Pt.  Reports some instability due to Lewey Body dementia Speech/Language:  Normal Rate Affect: Appropriate Mood: normal Thought process: normal Thought content:  WNL Sensory/Perceptual disturbances:  WNL Orientation: oriented to person, place, and situation Attention: Good Concentration: Good Memory: Impaired short term Fund of knowledge:  Good Insight:   Good Judgment:  Good Impulse Control: Good  Risk Assessment: [place] er to Self: No Self-injurious Behavior: No Danger to Others: No Duty to Warn:no Physical Aggression / Violence:No  Access to Firearms a concern: No  Gang Involvement:No  Treatment plan: Will employ cognitive behavioral therapy as well as grief and loss therapy for relief of anxiety and grief.  Goals of grief therapy  or to have a healthier response to the loss of his daughter and less sadness as evidenced by patient report in th erapy notes as well as to help him feel less overwhelmed by her loss.  Goals are to see a 50% reduction in grief symptoms over the next 6 months.  I will encourage the use of the patient telling his grief story about his daughter including encouraging him to bring pictures and memorabilia to help process his feelings including any feelings of guilt associated with her loss.  We will use cognitive behavioral therapy to help identify and  change anxiety producing thoughts and behavior patterns so as to improve his ability to better manage anxiety and stress, and manage thoughts and worrisome thinking contributing to anxiety.  We will also refresh and encourage dialectical behavior therapy distress tolerance and mindfulness skills with the intention of reducing anxiety by 50% over the next 6 months. Progress: 30% Interventions: Cognitive Behavioral Therapy  Diagnosis: PTSD, Bi-Polar D/O  Plan: F/U/ appointments scheduled weekly.  Lorrene CHRISTELLA Hasten, [facility]                                                                                                                   Lorrene CHRISTELLA Hasten, [facility]  [place] [facility] Counselor/Therapist Progress Note  Patient ID: Gilmar Bua, MRN: [id number],    Date: 04/08/2024 This session was held via video teletherapy. The patient consented to the video te letherapy and was located in his home during this session. He is aware it is the responsibility of the patient to secure confidentiality on his end of the session. The provider was in a private home office for the duration of this session.   The patient arrived on time for her Caregility session  Time Spent: 58 minutes, 2 PM to 2:58 PM  Treatment Type: Individual Therapy Reported Symptoms: anxiety, panic attacks A fairly diffic ult week because it was his daughter's birthday.  He said this year was more difficult than last year but he could not pinpoint why.  They did remember her about going out to a nice  restaurant which is something her daughter always wanted to do.  He said they were able to laugh and joke about funny things that his daughter had said or done which is not something they were able to do this time last year so he saw that as healthy grieving .  He knows that the time change threw things off a little bit for everybody and his wife still is not feeling well and his back has been bothering him.  He was not able to go to his first round of physical therapy this week because his back was bothering him more.  He has been thinking a lot more about his daughter which he feels comfortable with.  He is thankful that it is getting warmer and staying light longer so he can get back to  some of the coping skills such as fishing that he has done in the past.   We will continue to work on coping skills for helping with his anxiety and the progression of his Lewy body dementia diagnosis as well as processing his grief. Mental Status Exam: Appearance:  Well Groomed  Behavior: Appropriate Motor: Pt. Reports some instability due to Lewey Body dementia Speech/Language:  Normal Rate Affect: Appropriate Mood: norm al Thought process: normal Thought content:  WNL Sensory/Perceptual disturbances:  WNL Orientation: oriented to person, place, and situation Attention: Good Concentration: Good Memory: Impaired short term Fund of knowledge:  Good Insight:  Good Judgment:  Good Impulse Control: Good  Risk Assessment: Danger to Self: No Self-injurious Behavior: No Danger to Others: No Duty to Warn:no Physical Aggression / Violenc e:No  Access to Firearms a concern: No  Gang Involvement:No  Treatment plan: Will employ cognitive behavioral therapy as well as grief and loss therapy for relief of anxiety and grief.  Goals of grief therapy or to have a healthier response to the loss of his daughter and less sadness as evidenced by patient report in therapy notes as well as to help him feel  less overwhelmed by her loss.  Goals are to see a 50% reduction in grief sy mptoms over the next 6 months.  I will encourage the use of the patient telling his grief story about his daughter including encouraging him to bring pictures and memorabilia to help process his feelings including any feelings of guilt associated with her loss.  We will use cognitive behavioral therapy to help identify and change anxiety producing thoughts and behavior patterns so as to improve his ability to better manage anxiety and s tress, and manage thoughts and worrisome thinking contributing to anxiety.  We will also refresh and encourage dialectical behavior therapy  distress tolerance and mindfulness skills with the intention of reducing anxiety by 50% over the next 6 months. Progress: 30% Interventions: Cognitive Behavioral Therapy  Diagnosis:PTSD, Bi-Polar D/O  Plan: F/U/ appointments scheduled weekly.  Lorrene CHRISTELLA Hasten, [facility]                                                                                                                    Lorrene CHRISTELLA Hasten, [facility]               Lorrene CHRISTELLA Hasten, [facility]               Lorrene CHRISTELLA Hasten, [facility]  [place] [facility] Counselor/Therapist Progress Note  Patient ID: Elwyn Klosinski, MRN: [id number],    Date: 04/08/2024 This session was held via video teletherapy. The patient consented to the video teletherapy and was located in his ho me during this session. He is aware it is the responsibility of the patient to secure confidentiality on his end of the session. The provider was in a private home office for the duration of this session.   The patient arrived on time for her Caregility session  Time Spent: 1:05 PM until 2 PM, 55 minutes Treatment Type: Individual Therapy Reported Symptoms: anxiety, panic attacks The patient said he came in from his walk this m orning and saw what he thought was some sort of tic from his wife.  It was not the first time it happened and he wonders if it might be tardive dyskinesia because of some new medications that she is on.  She has a call into the doctor right now and hopes to hear something back soon.  They have narrowed down to the fact they think most of her discomfort in breathing is coming from the diaphragm and they are looking for alternatives for t reatment.  The patient continues to do those things which help him cope including walking, fishing when he can, spending time with his son and daughter-in-law and grandson.  He also  has a way of continued coping with grief he is writing letters to his daughters as a way of expressing his feelings and says that is very beneficial.  He appears to be grieving in a healthy way but he knows that he to year anniversary of her death is comin g up and there is some grief there but says it is not overwhelming.  We will continue to work on coping skills for helping with his anxiety and the progression of his Lewy body dementia diagnosis as well as processing his grief. Mental Status Exam: Appearance:  Well Groomed  Behavior: Appropriate Motor: Pt. Reports some instability due to Lewey Body dementia Speech/Language:  Normal Rate Affect: Appropriate Mood: normal Th ought process: normal Thought content:  WNL Sensory/Perceptual disturbances:  WNL Orientation: oriented to person, place, and situation Attention: Good Concentration: Good Memory: Impaired short term Fund of knowledge:  Good Insight:  Good Judgment:  Good Impulse Control: Good  Risk Assessment: Danger to Self: No Self-injurious Behavior: No Danger to Others: No Duty to Warn:no Physical Aggression / Violence:No   Access to Firearms a concern: No  Gang Involvement:No  Treatment plan: Will employ cognitive behavioral therapy as well as grief and loss therapy for relief of anxiety and grief.  Goals of grief therapy or to have a healthier response to the loss of his daughter and less sadness as evidenced by patient report in therapy notes as well as to help him feel less overwhelmed by her loss.  Goals are to see a 50% reduction in grief symptoms  over the next 6 months.  I will encourage the use of the patient telling his grief story about his daughter including encouraging him to bring pictures and memorabilia to help process his feelings including any feelings of guilt associated with her loss.  We will use cognitive behavioral therapy to help identify and change anxiety producing thoughts and behavior  patterns so as to improve his ability to better manage anxiety and stress,  and manage thoughts and worrisome thinking contributing to anxiety.  We will also refresh and encourage dialectical behavior therapy distress tolerance  and mindfulness skills with the intention of reducing anxiety by 50% over the next 6 months. Progress: 30% Interventions: Cognitive Behavioral Therapy  Diagnosis:PTSD, Bi-Polar D/O  Plan: F/U/ appointments scheduled weekly.  Lorrene CHRISTELLA Hasten, [facility]                                                                                                                    Lorrene CHRISTELLA Hasten, [facility]  [place] [facility] Counselor/Therapist Progress Note  Patient ID: Loghan Kurtzman, MRN: [id number],    Date: 04/08/2024 This session was held via video teletherapy. The patient consented to the video teletherapy and was located in his home during this session. He is aware it is the responsibility of the patient to secure  confidentiality on his end of the session. The provider was in a private home office for the duration of this session.   The patient arrived on time for her Caregility session  Time Spent: 59 minutes, 2 PM to 2:59 PM  Treatment Type: Individual Therapy Reported Symptoms: anxiety, panic attacks The patient continues to present with physical pain and both his back and his knee.  He started with a new physical therapist and told h im that there was a lot of arthritis in both knees and or certain things he cannot do but they had him do that anyway and now for the past 3 days he has been in significant knee pain.  He has continued to walk because he knows that is good for him in various ways but says it has been painful.  His wife also was not feeling well but she is going back to the doctor next week to have an ablation and hopes that will bring her some relief.   For the most part he has been at the last couple weeks reaching back out and hearing from some old friends as well as time with his son and son's family.  He says that his wife's cousin is coming down for a weekend soon and they enjoy time with her and that he will go to his brother's house sometime in the next few weeks.  He recognizes the need for people that he cares about in helping with his depression. We will continue to work on  coping skills for helping with his anxiety and the progression of his Lewy body dementia diagnosis as well as processing his grief. 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 Speech/Language:  Normal Rate Affect: Appropriate Mood: normal Thought process: normal Thought content:  WNL Sensory/Perceptual disturbances:  WNL Orientatio n: oriented to person, place, and situation Attention: Good Concentration: Good Memory: Impaired short term Fund of knowledge:  Good Insight:  Good Judgment:  Good Impulse Control: Good  Risk Assessment: Danger to Self: No Self-injurious Behavior: No Danger to Others: No Duty to Warn:no Physical Aggression / Violence:No  Access to Firearms a concern: No  Gang Involvement:No  Treatment plan: Will employ cognitive beh avioral therapy as well as grief and loss therapy for relief of anxiety and grief.  Goals of grief therapy or to have a healthier response to the loss of his daughter and less sadness as evidenced by patient report in therapy notes as well as to help him feel less overwhelmed by her loss.  Goals are to see a 50% reduction in grief symptoms over the next 6 months.  I will encourage the use of the patient telling his grief story about his  daughter including encouraging him to bring pictures and memorabilia to help process his feelings including any feelings of guilt associated with her loss.  We will use cognitive behavioral therapy to help identify and change anxiety producing thoughts and behavior patterns so as to improve his ability to better manage anxiety and stress, and manage thoughts and worrisome thinking contributing to anxiety.  We will also refresh  and enco urage dialectical behavior therapy distress tolerance and mindfulness skills with the intention of reducing anxiety by 50% over the next 6 months. Progress: 30% Interventions: Cognitive Behavioral Therapy  Diagnosis:PTSD, Bi-Polar D/O  Plan: F/U/ appointments scheduled weekly.  Lorrene CHRISTELLA Hasten,  [facility]                                                                                                                    Lorrene CHRISTELLA Hasten, [facility]               Lorrene CHRISTELLA Hasten, [facility]               Lorrene CHRISTELLA Hasten, [facility]               Lorrene CHRISTELLA Hasten, [facility]  [place] [facility] Counselor/Therapist Progress Note  Patient ID: Hussain Maimone, MRN: [id number],    Date: 04/08/2024 This session was held via video teletherapy. The patient consented to the video teletherapy and was located in his home during this session. He is aware it is  the responsibility of the patient to secure  confidentiality on his end of the session. The provider was in a private home office for the duration of this session.   The patient arrived on time for her Caregility session  Time Spent: 57 minutes, 2 PM to 2:57 PM  Treatment Type: Individual Therapy Reported Symptoms: anxiety, panic attacks The patient is still having significant back pain.  He cannot sit anywhere for very long and  has to be careful where he sits.  There is still some unsteadiness although he did not report any falls over the past 2 weeks.  His biggest concern now is for his wife.  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  She is meeting with her cardiologist again next week to see what possible treatment is for her.  He still thinks there may be some issue with he r diaphragm but she is meeting with the pulmonologist also.  He is doing what he can to help her.  His daughter's birthday is March 11 so he knows that is a difficult time for both he and his wife next week.  Typically they do something to remember her but because of the wife's current health issues they cannot go to [place] as they had planned so they are talking about what to do to remember and celebrate her birthday.  We wi ll continue to work on coping skills for helping with his anxiety and the progression of his Lewy body dementia diagnosis as well as processing his grief. Mental Status Exam: Appearance:  Well Groomed  Behavior: Appropriate Motor: Pt. Reports some instability due to Lewey Body dementia Speech/Language:  Normal Rate Affect: Appropriate Mood: normal Thought process: normal Thought content:  WNL Sensory/Perceptual disturban ces:  WNL Orientation: oriented to person, place, and situation Attention: Good Concentration: Good Memory: Impaired short term Fund of knowledge:  Good Insight:  Good Judgment:  Good Impulse Control: Good  Risk  Assessment: Danger to Self: No Self-injurious Behavior: No Danger to Others: No Duty to Warn:no Physical Aggression / Violence:No  Access to Firearms a concern: No  Gang Involvement:No  Treatment plan: Wi ll employ cognitive behavioral therapy as well as grief and loss therapy for relief of anxiety and grief.  Goals of grief therapy or to have a healthier response to the loss of his daughter and less sadness as evidenced by patient report in therapy notes as well as to help him feel less overwhelmed by her loss.  Goals are to see a 50% reduction in grief symptoms over the next 6 months.  I will encourage the use of the patient telling hi s grief story about his daughter including encouraging him to bring pictures and memorabilia to help process his feelings including any feelings of guilt associated with her loss.  We will use cognitive behavioral therapy to help identify and change anxiety producing thoughts and behavior patterns so as to improve his ability to better manage anxiety and stress, and manage thoughts and worrisome thinking contributing to anxiety.  We wil l also refresh and encourage dialectical behavior therapy distress tolerance and mindfulness  skills with the intention of reducing anxiety by 50% over the next 6 months. Progress: 30% Interventions: Cognitive Behavioral Therapy  Diagnosis:PTSD, Bi-Polar D/O  Plan: F/U/ appointments scheduled weekly.  Lorrene CHRISTELLA Hasten, [facility]                                                                                                                    Lorrene CHRISTELLA Hasten,  [facility]  [place] [facility] Counselor/Therapist Progress Note  Patient ID: Arland Usery, MRN: [id number],    Date: 04/08/2024 This session was held via video teletherapy. The patient consented to the video teletherapy and was located in his home during this session. He is aware it is the responsibility of the patient to secure confidentiality on his end of the session. The provider was in a private hom e office for the duration of this session.   The patient arrived on time for her Caregility session  Time Spent: 58 minutes, 2 PM to 2:58 PM  Treatment Type: Individual Therapy Reported Symptoms: anxiety, panic attacks A fairly difficult week because it was his daughter's birthday.  He said this year was more  difficult than last year but he could not pinpoint why.  They did remember her about going out to a nice restaurant whic h is something her daughter always wanted to do.  He said they were able to laugh and joke about funny things that his daughter had said or done which is not something they were able to do this time last year so he saw that as healthy grieving.  He knows that the time change threw things off a little bit for everybody and his wife still is not feeling well and his back has been bothering him.  He was not able to go to his first round of  physical therapy this week because his back was bothering him more.  He has been thinking a lot more about his daughter which he feels comfortable with.  He is thankful that it is getting warmer and staying light longer so he can get back to some of the coping skills such as fishing that he has done in the past.   We will continue to work on coping skills for helping with his anxiety and the progression of his Lewy body dementia dia gnosis as well as processing his grief. Mental Status Exam: Appearance:  Well Groomed  Behavior: Appropriate Motor: Pt. Reports some instability due to Lewey Body dementia Speech/Language:  Normal Rate Affect: Appropriate Mood: normal Thought process: normal Thought content:  WNL Sensory/Perceptual disturbances:  WNL Orientation: oriented to person, place, and situation Attention: Good Concentration: Good Memory: I mpaired short term Fund of knowledge:  Good Insight:  Good Judgment:  Good Impulse Control: Good  Risk Assessment: Danger to Self: No Self-injurious Behavior: No Danger to Others: No Duty to Warn:no Physical Aggression / Violence:No  Access to Firearms a concern: No  Gang Involvement:No  Treatment plan: Will employ cognitive behavioral therapy as well as grief and loss therapy for relief of anxiety and grief.  Goals of  grief therapy or to have a healthier response to the loss of  his daughter and less sadness as evidenced by patient report in therapy notes as well as to help him feel less overwhelmed by her loss.  Goals are to see a 50% reduction in grief symptoms over the next 6 months.  I will encourage the use of the patient telling his grief story about his daughter including encouraging him to bring pictures and memorabilia to help process his fe elings including any feelings of guilt associated with her loss.  We will use cognitive behavioral therapy to help identify and change anxiety producing thoughts and behavior patterns so as to improve his ability to better manage anxiety and stress, and manage thoughts and worrisome thinking contributing to anxiety.  We will also refresh and encourage dialectical behavior therapy distress  tolerance and mindfulness skills with the intent ion of reducing anxiety by 50% over the next 6 months. Progress: 30% Interventions: Cognitive Behavioral Therapy  Diagnosis:PTSD, Bi-Polar D/O  Plan: F/U/ appointments scheduled weekly.  Lorrene CHRISTELLA Hasten, [facility]                                                                                                                    Lorrene CHRISTELLA Hasten, [facility]               Lorrene CHRISTELLA Hasten, [facility]               Lorrene CHRISTELLA Hasten, [facility]  [place] [facility] Counselor/Therapist Progress Note  Patient ID: Aman Bonet, MRN: [id number],    Date: 04/08/2024 This session was held via video teletherapy. The patient consented to the video teletherapy and was located in his home during this session. He is aware it is the responsibility of the patient to secure confidentiality on his end of the session. The provider was in a private home office for the durati on of this session.   The patient arrived on time for her Caregility session  Time Spent: 57 minutes, 2 PM to 2:57 PM  Treatment Type: Individual Therapy Reported Symptoms: anxiety, panic attacks The patient is still having significant back pain.  He cannot sit anywhere for very long and has to be careful where he sits.  There is still some unsteadiness although he did not report any falls over the past 2 weeks.  His biggest co ncern now is for his wife.  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  She is meeting with her  cardiologist again next week to see what possible treatment is for her.  He still thinks there may be some issue with her diaphragm but she is meeting with the pulmonologist also.  He is doing what he can to help her.  His daughter's birthday is March 11 so he  knows that is a difficult time for both he and his wife next week.  Typically they do something to remember her but because of the wife's current health issues they cannot go to [place] as they had planned so they are talking about what to do to remember and celebrate her birthday.  We will continue to work on coping skills for helping with his anxiety and the progression of his Lewy body dementia diagnosis as well as processing  his grief. Mental Status Exam: Appearance:  Well Groomed  Behavior: Appropriate Motor: Pt. Reports some instability due to Lewey Body dementia Speech/Language:  Normal Rate Affect: Appropriate Mood: normal Thought process: normal Thought content:  WNL Sensory/Perceptual disturbances:  WNL Orientation: oriented to person, place, and situation Attention: Good Concentration: Good Memory: Impaired short term Fund of  knowledge:  Good Insight:  Good Judgment:  Good Impulse Control: Good  Risk Assessment: Danger to Self: No Self-injurious Behavior: No Danger to Others: No Duty to Warn:no Physical Aggression / Violence:No  Access to Firearms a concern: No  Gang Involvement:No  Treatment plan: Will employ cognitive behavioral therapy as well as grief and loss therapy for relief of anxiety and grief.  Goals of grief therapy or to have a  healthier response to the loss of his daughter and less sadness as evidenced by patient report in therapy notes as well as to help him feel less overwhelmed by her loss.  Goals are to see a 50% reduction in grief symptoms over the next 6 months.  I will encourage the use of the patient telling his grief story about his daughter including encouraging him to  bring pictures and memorabilia to help process his feelings including any feeling s of guilt associated with her loss.  We will use cognitive behavioral therapy to help identify and change anxiety producing thoughts and behavior patterns so as to improve his ability to better manage anxiety and stress, and manage thoughts and worrisome thinking contributing to anxiety.  We will also refresh and encourage dialectical behavior therapy distress tolerance and mindfulness skills  with the intention of reducing anxiety by 5 0% over the next 6 months. Progress: 30% Interventions: Cognitive Behavioral Therapy  Diagnosis:PTSD, Bi-Polar D/O  Plan: F/U/ appointments scheduled weekly.  Lorrene CHRISTELLA Hasten, [facility]                                                                                                                   Lorrene CHRISTELLA Hasten, [facility]  [place] [facility] Counselor/Therapist Progress Note  Patient ID: Davonta Stroot, MRN: [id number],     Date: 04/08/2024 This session was held via video teletherapy. The patient consented to the video teletherapy and was located in his home during this session. He is aware it is the responsibility of the patient to secure confidentiality on his end of the session. The provider was in a private home office for the duration of this session.   The patient arrived on time for her Caregility session  Time Spent: 57 minutes, 2 PM to 2:57  PM  Treatment Type: Individual Therapy Reported Symptoms: anxiety, panic attacks  We will continue to work on coping skills for helping with his anxiety and the progression of his Lewy body dementia diagnosis as well as processing his grief. Mental Status Exam: Appearance:  Well Groomed  Behavior: Appropriate Motor: Pt. Reports some instability due to Lewey Body dementia Speech/Language:  Normal Rate Affect: Appropriate  Mood: normal Thought process: normal Thought content:  WNL Sensory/Perceptual disturbances:  WNL Orientation: oriented to person, place, and situation Attention: Good Concentration: Good Memory: Impaired short term Fund of knowledge:  Good Insight:  Good Judgment:  Good Impulse Control: Good  Risk Assessment: Danger to Self: No Self-injurious Behavior: No Danger to Others: No Duty to Warn:no Physical Aggression  / Violence:No  Access to Firearms a concern: No  Gang Involvement:No  Treatment plan: Will employ cognitive behavioral therapy as well as grief and loss therapy for relief of  anxiety and grief.  Goals of grief therapy or to have a healthier response to the loss of his daughter and less sadness as evidenced by patient report in therapy notes as well as to help him feel less overwhelmed by her loss.  Goals are to see a 50% reduction i n grief symptoms over the next 6 months.  I will encourage the use of the patient telling his grief story about his daughter including encouraging him to bring pictures and memorabilia to help process his feelings including any feelings of guilt associated with her loss.  We will use cognitive behavioral therapy to help identify and change anxiety producing thoughts and behavior patterns so as to improve his ability to better manage anx iety and stress, and manage thoughts and worrisome thinking contributing to anxiety.  We will also refresh and encourage dialectical behavior therapy distress tolerance and mindfulness skills with the intention of reducing anxiety by 50% over the next 6 months. Progress: 30% Interventions: Cognitive Behavioral Therapy  Diagnosis:PTSD, Bi-Polar D/O  Plan: F/U/ appointments scheduled weekly.  Lorrene CHRISTELLA Hasten, [facility]                                                                                                                    Lorrene CHRISTELLA Hasten, [facility]               Lorrene CHRISTELLA Hasten, [facility]               Lorrene CHRISTELLA Hasten, [facility]  Lorrene CHRISTELLA Hasten, [facility]               Lorrene CHRISTELLA Hasten, [facility]                Lorrene CHRISTELLA Hasten, [facility]               Lorrene CHRISTELLA Hasten,  [facility]  [facility] Counselor/Therapist Progress Note  Patient ID: Trinton Prewitt, MRN: [id number],    Date: 04/08/2024 This session was held via video teletherapy. The patient consented to the video teletherapy and was located in his home during this session. He is aware it is the responsibility of the patient to secure confidentiality on his end of the session. The provider was in a private home office for the duration of this session.   The  patient arrived on time for her Caregility session  Time Spent: 39 minutes, 2 PM to 2:39 PM  Treatment Type: Individual Therapy Reported Symptoms: anxiety, panic attacks The patient was not feeling well today.  He started feeling really earlier in the week having a fever  bad headaches body aches.  He felt a little better yesterday but feels a little bit of a resurgence in headaches and body aches and is running a low-grade fever.   His wife is feeling some of the same symptoms.  For the most part he says he is doing well.  They did increase his memory medication because they were starting to see some increasing decline in short-term memory and he said until recently his long-term memory had been good but he was starting to see a difference in how he used to markers to remember what happened at certain times historically.  He feels the medication has helped and ha s brightened his mood a little also.  He is still trying to walk every day and fish when he can and has enjoyed watching the [employer] as a good distraction.  He reports that he feels he is managing mood fairly well.  He has an appointment in 2 weeks.  He does contract for safety having no thoughts of hurting himself or anyone else. We will continue to work on coping skills for helping with his anxiety and the progressi on of his Lewy body dementia diagnosis as well as processing his grief. Mental Status Exam: Appearance:  Well Groomed  Behavior: Appropriate Motor: Pt. Reports some instability due to Lewey Body dementia Speech/Language:  Normal Rate Affect: Appropriate Mood: normal Thought process: normal Thought content:  WNL Sensory/Perceptual disturbances:  WNL Orientation: oriented to person, place, and situation Attention: Good  Concentration: Good Memory: Impaired short term Fund of knowledge:  Good Insight:  Good Judgment:  Good Impulse Control: Good  Risk Assessment: Danger to Self: No Self-injurious Behavior: No Danger to Others: No Duty to Warn:no Physical Aggression / Violence:No  Access to Firearms a concern: No  Gang Involvement:No  Treatment plan: Will employ cognitive behavioral therapy as well as grief and loss therapy for relief  of anxiety and grief.   Goals of grief therapy or to have a healthier response to the loss of his daughter and less sadness as evidenced by patient report in therapy notes as well as to help him feel less overwhelmed by her loss.  Goals are to see a 50% reduction in grief symptoms over the next 6 months.  I will encourage the use of the patient telling his grief story about his daughter including encouraging him to bring pictures and me morabilia to help process his feelings including any feelings of guilt associated with her loss.  We will use cognitive behavioral therapy to help identify and change anxiety producing thoughts and behavior patterns so as to improve his ability to better manage anxiety and stress, and manage thoughts and worrisome thinking contributing to anxiety.  We will also refresh  and encourage dialectical behavior therapy distress tolerance and mi ndfulness skills with the intention of reducing anxiety

## 2024-04-22 ENCOUNTER — Ambulatory Visit: Admitting: Behavioral Health

## 2024-04-29 ENCOUNTER — Ambulatory Visit: Admitting: Behavioral Health

## 2024-04-29 NOTE — Progress Notes (Deleted)
 [facility] Counselor/Therapist Progress Note  Patient ID: Stephen Myers, MRN: [id number],    Date: 04/29/2024 This session was held via video teletherapy. The patient consented to the video teletherapy and was located in his home during this session. He is aware it is the responsibility of the patient to secure confidentiality on his end of the session. The provider was in a private home office for the duration of this session.    The patient arrived on time for her Caregility session  Time Spent: 59 minutes, 2 PM to 2:59 PM  Treatment Type: Individual Therapy Reported Symptoms: anxiety, panic attacks The patient said he jokingly said to his wife that he had not had any pain issues in the cervical area and then a  few days ago he started having pain in his neck and shoulders.  He now sits related to degenerative disk and has some consistent pain somewhere in his spine with that but jokingly says it just depends on Wednesday which part of his body is decides to show up and hurt.  He is doing the best that he can.  He is still walking which he knows is good for him but does not have right now the strength to do as much in the morning as he has done in the past so he is denying that to 3 times a day for consistency.  He is still doing some positive things and that he is staying in touch with his brother and other family members.  He is reconnecting with friends from high school and he is which she is enjoying.  One of them has had a similar experience in that she lost her son in the same way the patient lost his daughter.  He said they did not think and Easter in which they set a place for his daughter at the table and put her picture there.  He initially had reservations about doing that he and his wife had made everyone feel as if she was really there with him.  He still feels that he is grieving in a healthy way.  He and his wife have always taken a trip around both of their birthdays which are 2 days apart.  That is now close to his daughter died so they have started taking some of her rashes with him and sprinkling them where they go as a way of remembrance.  They are deciding what that looks like this year in July.  He does contract for safety having no thoughts of hurting himself or anyone else. We will continue to work on coping skills for helping with his anxiety and the progression of his Lewy body dementia diagnosis as well as processing his grief. Mental Status Exam: Appearance:  Well Groomed     Behavior: Appropriate  Motor: Pt. Reports some instability due to Lewey Body dementia  Speech/Language:  Normal Rate  Affect: Appropriate  Mood: normal  Thought process: normal  Thought content:   WNL   Sensory/Perceptual disturbances:   WNL  Orientation: oriented to person, place, and situation  Attention: Good  Concentration: Good  Memory: Impaired short term  Fund of knowledge:  Good  Insight:   Good  Judgment:  Good  Impulse Control: Good   Risk Assessment: Danger to Self:  No Self-injurious Behavior: No Danger to Others: No Duty to Warn:no Physical Aggression / Violence:No  Access to Firearms a concern: No  Gang Involvement:No  Treatment plan: Will employ cognitive behavioral therapy as well as  grief and loss therapy for relief of anxiety and grief.  Goals of grief therapy or to have a healthier response to the loss of his daughter and less sadness as evidenced by patient report in therapy notes as well as to help him feel less overwhelmed by her loss.  Goals are to see a 50% reduction in grief symptoms over the next 6 months.  I will encourage the use of the patient telling his grief story about his daughter including encouraging him to bring pictures and memorabilia to help process his feelings including any feelings of guilt associated with her loss.  We will use cognitive behavioral therapy to help identify and change anxiety producing thoughts and behavior patterns so as to improve his ability to better manage anxiety and stress, and manage thoughts and worrisome thinking contributing to anxiety.  We will also refresh and encourage dialectical behavior therapy distress tolerance and mindfulness skills with the intention of reducing anxiety by 50% over the next 6 months. Progress: 30% Interventions: Cognitive Behavioral Therapy  Diagnosis:PTSD, Bi-Polar D/O  Plan: F/U/ appointments scheduled weekly.  Lorrene CHRISTELLA Hasten, [facility]                                                                                                                   Lorrene CHRISTELLA Hasten,  [facility]  [facility] Counselor/Therapist Progress Note  Patient ID: Stephen Myers, MRN: [id number],    Date: 04/29/2024 This session was held via video teletherapy. The patient consented to the video teletherapy and was located in his home during this session. He is aware it is the responsibility of the patient to secure confidentiality on his end of the session. The provider was in a private home office for the duration of this session.    The patient arrived on time for her Caregility session  Time Spent: 58 minutes, 2 PM to 2:58 PM  Treatment Type: Individual Therapy Reported Symptoms: anxiety, panic attacks A fairly difficult week because it was his daughter's birthday.  He said this year was more  difficult than last year but he could not pinpoint why.  They did remember her about going out to a nice restaurant which is something her daughter always wanted to do.  He said they were able to laugh and joke about funny things that his daughter had said or done which is not something they were able to do this time last year so he saw that as healthy grieving.  He knows that the time change threw things off a little bit for everybody and his wife still is not feeling well and his back has been bothering him.  He was not able to go to his first round of physical therapy this week because his back was bothering him more.  He has been thinking a lot more about his daughter which he feels comfortable with.  He is thankful that it is getting warmer and staying light longer so he can get back to some of the coping skills such as fishing that he has done in the past.   We will continue to work on coping skills for helping with his anxiety and the progression of his Lewy body dementia diagnosis as well as processing his grief. Mental Status Exam: Appearance:  Well Groomed     Behavior: Appropriate  Motor: Pt. Reports some instability due to Lewey Body dementia  Speech/Language:  Normal Rate  Affect: Appropriate  Mood: normal  Thought process: normal  Thought content:   WNL  Sensory/Perceptual disturbances:   WNL  Orientation: oriented to person, place, and situation  Attention: Good  Concentration: Good  Memory: Impaired short term  Fund of knowledge:  Good  Insight:   Good  Judgment:  Good  Impulse Control: Good   Risk Assessment: Danger to Self:  No Self-injurious Behavior: No Danger to Others: No Duty to Warn:no Physical Aggression / Violence:No  Access to Firearms a concern: No  Gang Involvement:No  Treatment plan: Will employ cognitive behavioral therapy as well as grief and loss therapy for relief of anxiety and grief.  Goals of grief therapy or to have a healthier response to the loss  of his daughter and less sadness as evidenced by patient report in therapy notes as well as to help him feel less overwhelmed by her loss.  Goals are to see a 50% reduction in grief symptoms over the next 6 months.  I will encourage the use of the patient telling his grief story about his daughter including encouraging him to bring pictures and memorabilia to help process his feelings including any feelings of guilt associated with her loss.  We will use cognitive behavioral therapy to help identify and change anxiety producing thoughts and behavior patterns so as to improve his ability to better manage anxiety and stress, and manage thoughts  and worrisome thinking contributing to anxiety.  We will also refresh and encourage dialectical behavior therapy distress tolerance and mindfulness skills with the intention of reducing anxiety by 50% over the next 6 months. Progress: 30% Interventions: Cognitive Behavioral Therapy  Diagnosis:PTSD, Bi-Polar D/O  Plan: F/U/ appointments scheduled weekly.  Lorrene CHRISTELLA Hasten, [facility]                                                                                                                   Lorrene CHRISTELLA Hasten, [facility]               Lorrene CHRISTELLA Hasten, [facility]               Lorrene CHRISTELLA Hasten,  [facility]  [facility] Counselor/Therapist Progress Note  Patient ID: Stephen Myers, MRN: [id number],    Date: 04/29/2024 This session was held via video teletherapy. The patient consented to the video teletherapy and was located in his home during this session. He is aware it is the responsibility of the patient to secure confidentiality on his end of the session. The provider was in a private home office for the duration of this session.    The patient arrived on time for her Caregility session  Time Spent: 1:05 PM until 2 PM, 55 minutes Treatment Type: Individual Therapy Reported Symptoms: anxiety, panic attacks The patient said he came in from his walk this morning and saw what he thought was some sort of tic from his wife.  It  was not the first time it happened and he wonders if it might be tardive dyskinesia because of some new medications that she is on.  She has a call into the doctor right now and hopes to hear something back soon.  They have narrowed down to the fact they think most of her discomfort in breathing is coming from the diaphragm and they are looking for alternatives for treatment.  The patient continues to do those things which help him cope including walking, fishing when he can, spending time with his son and daughter-in-law and grandson.  He also has a way of continued coping with grief he is writing letters to his daughters as a way of expressing his feelings and says that is very beneficial.  He appears to be grieving in a healthy way but he knows that he to year anniversary of her death is coming up and there is some grief there but says it is not overwhelming.  We will continue to work on coping skills for helping with his anxiety and the progression of his Lewy body dementia diagnosis as well as processing his grief. Mental Status Exam: Appearance:  Well Groomed     Behavior: Appropriate  Motor: Pt. Reports some instability due to Lewey Body dementia  Speech/Language:  Normal Rate  Affect: Appropriate  Mood: normal  Thought process: normal  Thought content:   WNL  Sensory/Perceptual disturbances:   WNL  Orientation: oriented to person, place, and situation  Attention: Good  Concentration: Good  Memory: Impaired short term  Fund of knowledge:  Good  Insight:   Good  Judgment:  Good  Impulse Control: Good   Risk Assessment: Danger to Self:  No Self-injurious Behavior: No Danger to Others: No Duty to Warn:no Physical Aggression / Violence:No  Access to Firearms a concern: No  Gang Involvement:No  Treatment plan: Will employ cognitive behavioral therapy as well as grief and loss therapy for relief of anxiety and grief.  Goals of grief therapy or to have a healthier response to the loss  of his daughter and less sadness as evidenced by patient report in therapy notes as well as to help him feel less overwhelmed by her loss.  Goals are to see a 50% reduction in grief symptoms over the next 6 months.  I will encourage the use of the patient telling his grief story about his daughter including encouraging him to bring pictures and memorabilia to help process his feelings including any feelings of guilt associated with her loss.  We will use cognitive behavioral therapy to help identify and change anxiety producing thoughts and behavior patterns so as to improve his ability to better manage anxiety and stress, and manage thoughts and worrisome  thinking contributing to anxiety.  We will also refresh and encourage dialectical behavior therapy distress tolerance and mindfulness skills with the intention of reducing anxiety by 50% over the next 6 months. Progress: 30% Interventions: Cognitive Behavioral Therapy  Diagnosis:PTSD, Bi-Polar D/O  Plan: F/U/ appointments scheduled weekly.  Lorrene CHRISTELLA Hasten, [facility]                                                                                                                   Lorrene CHRISTELLA Hasten,  [facility]  [facility] Counselor/Therapist Progress Note  Patient ID: Stephen Myers, MRN: [id number],    Date: 04/29/2024 This session was held via video teletherapy. The patient consented to the video teletherapy and was located in his home during this session. He is aware it is the responsibility of the patient to secure confidentiality on his end of the session. The provider was in a private home office for the duration of this session.    The patient arrived on time for her Caregility session  Time Spent: 59 minutes, 2 PM to 2:59 PM  Treatment Type: Individual Therapy Reported Symptoms: anxiety, panic attacks The patient continues to present with physical pain and both his back and his knee.  He started  with a new physical therapist and told him that there was a lot of arthritis in both knees and or certain things he cannot do but they had him do that anyway and now for the past 3 days he has been in significant knee pain.  He has continued to walk because he knows that is good for him in various ways but says it has been painful.  His wife also was not feeling well but she is going back to the doctor next week to have an ablation and hopes that will bring her some relief.  For the most part he has been at the last couple weeks reaching back out and hearing from some old friends as well as time with his son and son's family.  He says that his wife's cousin is coming down for a weekend soon and they enjoy time with her and that he will go to his brother's house sometime in the next few weeks.  He recognizes the need for people that he cares about in helping with his depression. We will continue to work on coping skills for helping with his anxiety and the progression of his Lewy body dementia diagnosis as well as processing his grief. Mental Status Exam: Appearance:  Well Groomed     Behavior: Appropriate  Motor: Pt. Reports some instability due to Lewey Body dementia  Speech/Language:  Normal Rate  Affect: Appropriate  Mood: normal  Thought process: normal  Thought content:   WNL  Sensory/Perceptual disturbances:   WNL  Orientation: oriented to person, place, and situation  Attention: Good  Concentration: Good  Memory: Impaired short term  Fund of knowledge:  Good  Insight:   Good  Judgment:  Good  Impulse Control: Good   Risk Assessment: Danger to Self:  No Self-injurious Behavior: No Danger to Others: No Duty to Warn:no Physical Aggression / Violence:No  Access to Firearms a concern: No  Gang Involvement:No  Treatment plan: Will employ cognitive behavioral therapy as well as grief and loss therapy for relief of anxiety and grief.  Goals of grief therapy or to have a healthier response  to the loss of his daughter and less sadness as evidenced by patient report in therapy notes as well as to help him feel less overwhelmed by her loss.  Goals are to see a 50% reduction in grief symptoms over the next 6 months.  I will encourage the use of the patient telling his grief story about his daughter including encouraging him to bring pictures and memorabilia to help process his feelings including any feelings of guilt associated with her loss.  We will use cognitive behavioral therapy to help identify and change anxiety producing thoughts and behavior patterns so as to improve his ability to better  manage anxiety and stress, and manage thoughts and worrisome thinking contributing to anxiety.  We will also refresh and encourage dialectical behavior therapy distress tolerance and mindfulness skills with the intention of reducing anxiety by 50% over the next 6 months. Progress: 30% Interventions: Cognitive Behavioral Therapy  Diagnosis:PTSD, Bi-Polar D/O  Plan: F/U/ appointments scheduled weekly.  Lorrene CHRISTELLA Hasten, [facility]                                                                                                                   Lorrene CHRISTELLA Hasten, [facility]               Lorrene CHRISTELLA Hasten, [facility]               Lorrene CHRISTELLA Hasten, [facility]               Lorrene CHRISTELLA Hasten,  [facility]  [facility] Counselor/Therapist Progress Note  Patient ID: Stephen Myers, MRN: [id number],    Date: 04/29/2024 This session was held via video teletherapy. The patient consented to the video teletherapy and was located in his home during this session. He is aware it is the responsibility of the patient to secure confidentiality on his end of the session. The provider was in a private home office for the duration of this session.    The patient arrived on time for her Caregility session  Time Spent: 57 minutes, 2 PM to 2:57 PM  Treatment Type: Individual Therapy Reported Symptoms: anxiety, panic attacks The patient is still having significant back pain.  He cannot sit anywhere for very long and has to be  careful where he sits.  There is still some unsteadiness although he did not report any falls over the past 2 weeks.  His biggest concern now is for his wife.  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  She is meeting with her cardiologist again next week to see what possible treatment is for her.  He still thinks there may be some issue with her diaphragm but she is meeting with the pulmonologist also.  He is doing what he can to help her.  His daughter's birthday is March 11 so he knows that is a difficult time for both he and his wife next week.  Typically they do something to remember her but because of the wife's current health issues they cannot go to [place] as they had planned so they are talking about what to do to remember and celebrate her birthday.  We will continue to work on coping skills for helping with his anxiety and the progression of his Lewy body dementia diagnosis as well as processing his grief. Mental Status Exam: Appearance:  Well Groomed     Behavior: Appropriate  Motor: Pt. 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  Speech/Language:  Normal Rate  Affect: Appropriate  Mood: normal  Thought process: normal  Thought content:   WNL  Sensory/Perceptual disturbances:   WNL  Orientation: oriented to person, place, and situation  Attention: Good  Concentration: Good  Memory: Impaired short term  Fund of knowledge:  Good  Insight:   Good  Judgment:  Good  Impulse Control: Good   Risk Assessment: Danger to Self:  No Self-injurious Behavior: No Danger to Others: No Duty to Warn:no Physical Aggression / Violence:No  Access to Firearms a concern: No  Gang Involvement:No  Treatment plan: Will employ cognitive behavioral therapy as well as grief and loss therapy for relief of anxiety and grief.  Goals of grief therapy or to have a healthier response to the loss of his daughter  and less sadness as evidenced by patient report in therapy notes as well as to help him feel less overwhelmed by her loss.  Goals are to see a 50% reduction in grief symptoms over the next 6 months.  I will encourage the use of the patient telling his grief story about his daughter including encouraging him to bring pictures and memorabilia to help process his feelings including any feelings of guilt associated with her loss.  We will use cognitive behavioral therapy to help identify and change anxiety producing thoughts and behavior patterns so as to improve his ability to better manage anxiety and stress, and manage thoughts and worrisome thinking contributing  to anxiety.  We will also refresh and encourage dialectical behavior therapy distress tolerance and mindfulness skills with the intention of reducing anxiety by 50% over the next 6 months. Progress: 30% Interventions: Cognitive Behavioral Therapy  Diagnosis:PTSD, Bi-Polar D/O  Plan: F/U/ appointments scheduled weekly.  Lorrene CHRISTELLA Hasten, [facility]                                                                                                                   Lorrene CHRISTELLA Hasten,  [facility]  [facility] Counselor/Therapist Progress Note  Patient ID: Stephen Myers, MRN: [id number],    Date: 04/29/2024 This session was held via video teletherapy. The patient consented to the video teletherapy and was located in his home during this session. He is aware it is the responsibility of the patient to secure confidentiality on his end of the session. The provider was in a private home office for the duration of this session.    The patient arrived on time for her Caregility session  Time Spent: 58 minutes, 2 PM to 2:58 PM  Treatment Type: Individual Therapy Reported Symptoms: anxiety, panic attacks A fairly difficult week because it was his daughter's birthday.  He said this year was more  difficult than last year but he could not pinpoint why.  They did remember her about going out to a nice restaurant which is something her daughter always wanted to do.  He said they were able to laugh and joke about funny things that his daughter had said or done which is not something they were able to do this time last year so he saw that as healthy grieving.  He knows that the time change threw things off a little bit for everybody and his wife still is not feeling well and his back has been bothering him.  He was not able to go to his first round of physical therapy this week because his back was bothering him more.  He has been thinking a lot more about his daughter which he feels comfortable with.  He is thankful that it is getting warmer and staying light longer so he can get back to some of the coping skills such as fishing that he has done in the past.   We will continue to work on coping skills for helping with his anxiety and the progression of his Lewy body dementia diagnosis as well as processing his grief. Mental Status Exam: Appearance:  Well Groomed     Behavior: Appropriate  Motor: Pt. 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  Speech/Language:  Normal Rate  Affect: Appropriate  Mood: normal  Thought process: normal  Thought content:   WNL  Sensory/Perceptual disturbances:   WNL  Orientation: oriented to person, place, and situation  Attention: Good  Concentration: Good  Memory: Impaired short term  Fund of knowledge:  Good  Insight:   Good  Judgment:  Good  Impulse Control: Good   Risk Assessment: Danger to Self:  No Self-injurious Behavior: No Danger to Others: No Duty to Warn:no Physical Aggression / Violence:No  Access to Firearms a concern: No  Gang Involvement:No  Treatment plan: Will employ cognitive behavioral therapy as well as grief and loss therapy for relief of anxiety and grief.  Goals of grief therapy or to have a healthier response to the loss  of his daughter and less sadness as evidenced by patient report in therapy notes as well as to help him feel less overwhelmed by her loss.  Goals are to see a 50% reduction in grief symptoms over the next 6 months.  I will encourage the use of the patient telling his grief story about his daughter including encouraging him to bring pictures and memorabilia to help process his feelings including any feelings of guilt associated with her loss.  We will use cognitive behavioral therapy to help identify and change anxiety producing thoughts and behavior patterns so as to improve his ability to better manage anxiety and stress, and manage thoughts  and worrisome thinking contributing to anxiety.  We will also refresh and encourage dialectical behavior therapy distress tolerance and mindfulness skills with the intention of reducing anxiety by 50% over the next 6 months. Progress: 30% Interventions: Cognitive Behavioral Therapy  Diagnosis:PTSD, Bi-Polar D/O  Plan: F/U/ appointments scheduled weekly.  Lorrene CHRISTELLA Hasten, [facility]                                                                                                                   Lorrene CHRISTELLA Hasten, [facility]               Lorrene CHRISTELLA Hasten, [facility]               Lorrene CHRISTELLA Hasten,  [facility]   Behavioral Health Counselor/Therapist Progress Note  Patient ID: Stephen Myers, MRN: [id number],    Date: 04/29/2024 This session was held via video teletherapy. The patient consented to the video teletherapy and was located in his home during this session. He is aware it is the responsibility of the patient to secure confidentiality on his end of the session. The provider was in a private home office for the duration of this session.    The patient arrived on time for her Caregility session  Time Spent: 57 minutes, 2 PM to 2:57 PM  Treatment Type: Individual Therapy Reported Symptoms: anxiety, panic attacks The patient is still having significant back pain.  He cannot sit anywhere for very long and has to be  careful where he sits.  There is still some unsteadiness although he did not report any falls over the past 2 weeks.  His biggest concern now is for his wife.  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  She is meeting with her cardiologist again next week to see what possible treatment is for her.  He still thinks there may be some issue with her diaphragm but she is meeting with the pulmonologist also.  He is doing what he can to help her.  His daughter's birthday is March 11 so he knows that is a difficult time for both he and his wife next week.  Typically they do something to remember her but because of the wife's current health issues they cannot go to [place] as they had planned so they are talking about what to do to remember and celebrate her birthday.  We will continue to work on coping skills for helping with his anxiety and the progression of his Lewy body dementia diagnosis as well as processing his grief. Mental Status Exam: Appearance:  Well Groomed     Behavior: Appropriate  Motor: Pt. Reports some instability due to Lewey Body dementia  Speech/Language:  Normal Rate  Affect: Appropriate  Mood: normal  Thought process: normal  Thought content:   WNL  Sensory/Perceptual disturbances:   WNL  Orientation: oriented to person, place, and situation  Attention: Good  Concentration: Good  Memory: Impaired short term  Fund of knowledge:  Good  Insight:   Good  Judgment:  Good  Impulse Control: Good   Risk Assessment: Danger to Self:  No Self-injurious Behavior: No Danger to Others: No Duty to Warn:no Physical Aggression / Violence:No  Access to Firearms a concern: No  Gang Involvement:No  Treatment plan: Will employ cognitive behavioral therapy as well as grief and loss therapy for relief of anxiety and grief.  Goals of grief therapy or to have a healthier response to the loss of his daughter  and less sadness as evidenced by patient report in therapy notes as well as to help him feel less overwhelmed by her loss.  Goals are to see a 50% reduction in grief symptoms over the next 6 months.  I will encourage the use of the patient telling his grief story about his daughter including encouraging him to bring pictures and memorabilia to help process his feelings including any feelings of guilt associated with her loss.  We will use cognitive behavioral therapy to help identify and change anxiety producing thoughts and behavior patterns so as to improve his ability to better manage anxiety and stress, and manage thoughts and worrisome thinking contributing  to anxiety.  We will also refresh and encourage dialectical behavior therapy distress tolerance and mindfulness skills with the intention of reducing anxiety by 50% over the next 6 months. Progress: 30% Interventions: Cognitive Behavioral Therapy  Diagnosis:PTSD, Bi-Polar D/O  Plan: F/U/ appointments scheduled weekly.  Lorrene CHRISTELLA Hasten, [facility]                                                                                                                   Lorrene CHRISTELLA Hasten,  [facility]  [place] Behavioral Health Counselor/Therapist Progress Note  Patient ID: Stephen Myers, MRN: [id number],    Date: 04/29/2024 This session was held via video teletherapy. The patient consented to the video teletherapy and was located in his home during this session. He is aware it is the responsibility of the patient to secure confidentiality on his end of the session. The provider was in a private home office for the duration of this session.    The patient arrived on time for her Caregility session  Time Spent: 57 minutes, 2 PM to 2:57 PM  Treatment Type: Individual Therapy Reported Symptoms: anxiety, panic attacks  We will continue to work on coping skills for helping with his anxiety and the progression of  his Lewy body dementia diagnosis as well as processing his grief. Mental Status Exam: Appearance:  Well Groomed     Behavior: Appropriate  Motor: Pt. 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  Speech/Language:  Normal Rate  Affect: Appropriate  Mood: normal  Thought process: normal  Thought content:   WNL  Sensory/Perceptual disturbances:   WNL  Orientation: oriented to person, place, and situation  Attention: Good  Concentration: Good  Memory: Impaired short term  Fund of knowledge:  Good  Insight:   Good  Judgment:  Good  Impulse Control: Good   Risk Assessment: Danger to Self:  No Self-injurious Behavior: No Danger to Others: No Duty to Warn:no Physical Aggression / Violence:No  Access to Firearms a concern: No  Gang Involvement:No  Treatment plan: Will employ cognitive behavioral therapy as well as grief and loss therapy for relief of anxiety and grief.  Goals of grief therapy or to have a healthier response to the loss of his daughter and less sadness as evidenced by patient report in therapy notes as well as to help him feel less overwhelmed by her loss.  Goals are to see a 50% reduction in grief symptoms over the next 6 months.  I will encourage the use of the patient telling his grief story about his daughter including encouraging him to bring pictures and memorabilia to help process his feelings including any feelings of guilt associated with her loss.  We will use cognitive behavioral therapy to help identify and change anxiety producing thoughts and behavior patterns so as to improve his ability to better manage anxiety and stress, and manage thoughts and worrisome thinking contributing to anxiety.  We will also refresh and encourage dialectical behavior therapy distress tolerance and mindfulness skills with the intention of reducing anxiety by 50% over the next 6 months. Progress: 30% Interventions: Cognitive Behavioral Therapy  Diagnosis:PTSD, Bi-Polar  D/O  Plan: F/U/ appointments scheduled weekly.  Lorrene CHRISTELLA Hasten, [facility]                                                                                                                   Lorrene CHRISTELLA Hasten, [facility]               Lorrene CHRISTELLA Hasten, [facility]  Lorrene CHRISTELLA Hasten, [facility]               Lorrene CHRISTELLA Hasten, [facility]               Lorrene CHRISTELLA Hasten, [facility]               Lorrene CHRISTELLA Hasten, [facility]               Lorrene CHRISTELLA Hasten, [facility]  [place] Behavioral Health Counselor/Therapist Progress Note  Patient ID: Stephen Myers, MRN: [id number],    Date:  04/29/2024 This session was held via video teletherapy. The patient consented to the video teletherapy and was located in his home during this session. He is aware it is the responsibility of the patient to secure confidentiality on his end of the session. The provider was in a private home office for the duration of this session.    The patient arrived on time for her Caregility session  Time Spent: 39 minutes, 2 PM to 2:39 PM  Treatment Type: Individual Therapy Reported Symptoms: anxiety, panic attacks The patient was not feeling well today.  He started feeling really earlier in the week having a fever bad headaches body aches.  He felt a little better yesterday but feels a little bit of a resurgence in headaches and body aches and is running a low-grade fever.  His wife is feeling some of the same symptoms.  For the most part he says he is doing well.  They did increase his memory medication because they were starting to see some increasing decline in short-term memory and he said until recently his long-term memory had been good but he was starting to see a difference in how he used to markers to remember what happened at certain times historically.  He feels the medication has helped and has brightened his mood a little also.  He is still trying to walk every day and fish when he can and has enjoyed watching the [employer] as a good distraction.  He reports that he feels he is managing mood fairly well.  He has an appointment in 2 weeks.  He does contract for safety having no thoughts of hurting himself or anyone else. We will continue to work on coping skills for helping with his anxiety and the progression of his Lewy body dementia diagnosis as well as processing his grief. Mental Status Exam: Appearance:  Well Groomed     Behavior: Appropriate  Motor: Pt. 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 instability due to Lewey Body dementia  Speech/Language:  Normal Rate  Affect: Appropriate  Mood: normal   Thought process: normal  Thought content:   WNL  Sensory/Perceptual disturbances:   WNL  Orientation: oriented to person, place, and situation  Attention: Good  Concentration: Good  Memory: Impaired short term  Fund of knowledge:  Good  Insight:   Good  Judgment:  Good  Impulse Control: Good   Risk Assessment: Danger to Self:  No Self-injurious Behavior: No Danger to Others: No Duty to Warn:no Physical Aggression / Violence:No  Access to Firearms a concern: No  Gang Involvement:No  Treatment plan: Will employ cognitive behavioral therapy as well as grief and loss therapy for relief of anxiety and grief.  Goals of grief therapy or to have a healthier response to the loss of his daughter and less sadness as evidenced by patient report in therapy notes as well as to help him feel less overwhelmed by her loss.  Goals are to see a 50% reduction in grief symptoms over the next 6 months.  I will encourage the use of the patient telling his grief story about his daughter including encouraging him to bring pictures and memorabilia to help process his feelings including any feelings of guilt associated with her loss.  We will use cognitive behavioral therapy to help identify and change anxiety producing thoughts and behavior patterns so as to improve his ability to better manage anxiety  and stress, and manage thoughts and worrisome thinking contributing to anxiety.  We will also refresh and encourage dialectical behavior therapy distress tolerance and mindfulness skills with the intention of reducing anxiety by 50% over the next 6 months. Progress: 30% Interventions: Cognitive Behavioral Therapy  Diagnosis:PTSD, Bi-Polar D/O  Plan: F/U/ appointments scheduled weekly.  Lorrene CHRISTELLA Hasten,  [facility]                                                                                                                   Lorrene CHRISTELLA Hasten, [facility]  [place] Behavioral Health Counselor/Therapist Progress Note  Patient ID: Stephen Myers, MRN: [id number],    Date: 04/29/2024 This session was held via video teletherapy. The patient consented to the video teletherapy and was located in his home during this session. He is aware it is the responsibility of the patient to secure confidentiality on his end of the session. The provider was in a private home office for the duration of this session.    The patient arrived on  time for her Caregility session  Time Spent: 58 minutes, 2 PM to 2:58 PM  Treatment Type: Individual Therapy Reported Symptoms: anxiety, panic attacks A fairly difficult week because it was his daughter's birthday.  He said this year was more difficult than last year but he could not pinpoint why.  They did remember her about going out to a nice restaurant which is something her daughter always wanted to do.  He said they were able to laugh and joke about funny things that his daughter had said or done which is not something they were able to do this time last year so he saw that as healthy grieving.  He knows that the time change threw things off a little bit for everybody and his wife still is not feeling well and his back has been bothering him.  He was not able to go to his first round of physical therapy this week because his back was bothering him more.  He has been thinking a lot more about his daughter which he feels comfortable with.  He is thankful that it is getting warmer and staying light longer so he can get back to some of the coping skills such as fishing that he has done in the past.   We will continue to work on coping skills for helping with his anxiety and the progression of his Lewy body dementia diagnosis as well as processing his grief. Mental Status Exam: Appearance:  Well Groomed     Behavior: Appropriate  Motor: Pt. Reports some instability due to Lewey Body dementia  Speech/Language:  Normal Rate  Affect: Appropriate  Mood: normal  Thought process: normal  Thought content:   WNL  Sensory/Perceptual disturbances:   WNL  Orientation: oriented to person, place, and situation  Attention: Good  Concentration: Good  Memory: Impaired short term  Fund of knowledge:  Good  Insight:   Good  Judgment:  Good  Impulse Control: Good   Risk Assessment: Danger to Self:  No Self-injurious Behavior: No Danger to Others: No Duty to Warn:no Physical Aggression / Violence:No   Access to Firearms a concern: No  Gang Involvement:No  Treatment plan: Will employ cognitive behavioral therapy as well as grief and loss therapy for relief of anxiety and grief.  Goals of grief therapy or to have a healthier response to the loss of his daughter and less sadness as evidenced by patient report in therapy notes as well as to help him feel less overwhelmed by her loss.  Goals are to see a 50% reduction in grief symptoms over the next 6 months.  I will encourage the use of the patient telling his grief story about his daughter including encouraging him to bring pictures and memorabilia to help process his feelings including any feelings of guilt associated with her loss.  We will use cognitive behavioral therapy to help identify and change anxiety producing thoughts and behavior patterns so as to improve his ability to better manage anxiety and stress, and manage thoughts  and worrisome thinking contributing to anxiety.  We will also refresh and encourage dialectical behavior therapy distress tolerance and mindfulness skills with the intention of reducing anxiety by 50% over the next 6 months. Progress: 30% Interventions: Cognitive Behavioral Therapy  Diagnosis:PTSD, Bi-Polar D/O  Plan: F/U/ appointments scheduled weekly.  Lorrene CHRISTELLA Hasten, [facility]                                                                                                                   Lorrene CHRISTELLA Hasten, [facility]               Lorrene CHRISTELLA Hasten, [facility]               Lorrene CHRISTELLA Hasten,  [facility]  [place] Behavioral Health Counselor/Therapist Progress Note  Patient ID: Keldric Poyer, MRN: [id number],    Date: 04/29/2024 This session was held via video teletherapy. The patient consented to the video teletherapy and was located in his home during this session. He is aware it is the responsibility of the patient to secure confidentiality on his end of the session. The provider was in a private home office for the duration of this session.    The patient arrived on time for her Caregility session  Time Spent: 1:05 PM until 2 PM, 55 minutes Treatment Type: Individual Therapy Reported Symptoms: anxiety, panic attacks The patient said he came in from his walk this morning and saw what he thought was some sort of tic from his wife.  It  was not the first time it happened and he wonders if it might be tardive dyskinesia because of some new medications that she is on.  She has a call into the doctor right now and hopes to hear something back soon.  They have narrowed down to the fact they think most of her discomfort in breathing is coming from the diaphragm and they are looking for alternatives for treatment.  The patient continues to do those things which help him cope including walking, fishing when he can, spending time with his son and daughter-in-law and grandson.  He also has a way of continued coping with grief he is writing letters to his daughters as a way of expressing his feelings and says that is very beneficial.  He appears to be grieving in a healthy way but he knows that he to year anniversary of her death is coming up and there is some grief there but says it is not overwhelming.  We will continue to work on coping skills for helping with his anxiety and the progression of his Lewy body dementia diagnosis as well as processing his grief. Mental Status Exam: Appearance:  Well Groomed     Behavior: Appropriate  Motor: Pt. Reports some instability due to Lewey Body dementia  Speech/Language:  Normal Rate  Affect: Appropriate  Mood: normal  Thought process: normal  Thought content:   WNL  Sensory/Perceptual disturbances:   WNL  Orientation: oriented to person, place, and situation  Attention: Good  Concentration: Good  Memory: Impaired short term  Fund of knowledge:  Good  Insight:   Good  Judgment:  Good  Impulse Control: Good   Risk Assessment: Danger to Self:  No Self-injurious Behavior: No Danger to Others: No Duty to Warn:no Physical Aggression / Violence:No  Access to Firearms a concern: No  Gang Involvement:No  Treatment plan: Will employ cognitive behavioral therapy as well as grief and loss therapy for relief of anxiety and grief.  Goals of grief therapy or to have a healthier response to the loss  of his daughter and less sadness as evidenced by patient report in therapy notes as well as to help him feel less overwhelmed by her loss.  Goals are to see a 50% reduction in grief symptoms over the next 6 months.  I will encourage the use of the patient telling his grief story about his daughter including encouraging him to bring pictures and memorabilia to help process his feelings including any feelings of guilt associated with her loss.  We will use cognitive behavioral therapy to help identify and change anxiety producing thoughts and behavior patterns so as to improve his ability to better manage anxiety and stress, and manage thoughts and worrisome  thinking contributing to anxiety.  We will also refresh and encourage dialectical behavior therapy distress tolerance and mindfulness skills with the intention of reducing anxiety by 50% over the next 6 months. Progress: 30% Interventions: Cognitive Behavioral Therapy  Diagnosis:PTSD, Bi-Polar D/O  Plan: F/U/ appointments scheduled weekly.  Lorrene CHRISTELLA Hasten, [facility]                                                                                                                   Lorrene CHRISTELLA Hasten,  [facility]  [place] Behavioral Health Counselor/Therapist Progress Note  Patient ID: Stephen Myers, MRN: [id number],    Date: 04/29/2024 This session was held via video teletherapy. The patient consented to the video teletherapy and was located in his home during this session. He is aware it is the responsibility of the patient to secure confidentiality on his end of the session. The provider was in a private home office for the duration of this session.    The patient arrived on time for her Caregility session  Time Spent: 59 minutes, 2 PM to 2:59 PM  Treatment Type: Individual Therapy Reported Symptoms: anxiety, panic attacks The patient continues to present with physical pain and both his back and his knee.  He started  with a new physical therapist and told him that there was a lot of arthritis in both knees and or certain things he cannot do but they had him do that anyway and now for the past 3 days he has been in significant knee pain.  He has continued to walk because he knows that is good for him in various ways but says it has been painful.  His wife also was not feeling well but she is going back to the doctor next week to have an ablation and hopes that will bring her some relief.  For the most part he has been at the last couple weeks reaching back out and hearing from some old friends as well as time with his son and son's family.  He says that his wife's cousin is coming down for a weekend soon and they enjoy time with her and that he will go to his brother's house sometime in the next few weeks.  He recognizes the need for people that he cares about in helping with his depression. We will continue to work on coping skills for helping with his anxiety and the progression of his Lewy body dementia diagnosis as well as processing his grief. Mental Status Exam: Appearance:  Well Groomed     Behavior: Appropriate  Motor: Pt. Reports some instability due to Lewey Body dementia  Speech/Language:  Normal Rate  Affect: Appropriate  Mood: normal  Thought process: normal  Thought content:   WNL  Sensory/Perceptual disturbances:   WNL  Orientation: oriented to person, place, and situation  Attention: Good  Concentration: Good  Memory: Impaired short term  Fund of knowledge:  Good  Insight:   Good  Judgment:  Good  Impulse Control: Good   Risk Assessment: Danger to Self:  No Self-injurious Behavior: No Danger to Others: No Duty to Warn:no Physical Aggression / Violence:No  Access to Firearms a concern: No  Gang Involvement:No  Treatment plan: Will employ cognitive behavioral therapy as well as grief and loss therapy for relief of anxiety and grief.  Goals of grief therapy or to have a healthier response  to the loss of his daughter and less sadness as evidenced by patient report in therapy notes as well as to help him feel less overwhelmed by her loss.  Goals are to see a 50% reduction in grief symptoms over the next 6 months.  I will encourage the use of the patient telling his grief story about his daughter including encouraging him to bring pictures and memorabilia to help process his feelings including any feelings of guilt associated with her loss.  We will use cognitive behavioral therapy to help identify and change anxiety producing thoughts and behavior patterns so as to improve his ability to better  manage anxiety and stress, and manage thoughts and worrisome thinking contributing to anxiety.  We will also refresh and encourage dialectical behavior therapy distress tolerance and mindfulness skills with the intention of reducing anxiety by 50% over the next 6 months. Progress: 30% Interventions: Cognitive Behavioral Therapy  Diagnosis:PTSD, Bi-Polar D/O  Plan: F/U/ appointments scheduled weekly.  Lorrene CHRISTELLA Hasten, [facility]                                                                                                                   Lorrene CHRISTELLA Hasten, [facility]               Lorrene CHRISTELLA Hasten, [facility]               Lorrene CHRISTELLA Hasten, [facility]               Lorrene CHRISTELLA Hasten,  [facility]  [place] Behavioral Health Counselor/Therapist Progress Note  Patient ID: Stephen Myers, MRN: [id number],    Date: 04/29/2024 This session was held via video teletherapy. The patient consented to the video teletherapy and was located in his home during this session. He is aware it is the responsibility of the patient to secure confidentiality on his end of the session. The provider was in a private home office for the duration of this session.    The patient arrived on time for her Caregility session  Time Spent: 57 minutes, 2 PM to 2:57 PM  Treatment Type: Individual Therapy Reported Symptoms: anxiety, panic attacks The patient is still having significant back pain.  He cannot sit anywhere for very long and has to be  careful where he sits.  There is still some unsteadiness although he did not report any falls over the past 2 weeks.  His biggest concern now is for his wife.  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  She is meeting with her cardiologist again next week to see what possible treatment is for her.  He still thinks there may be some issue with her diaphragm but she is meeting with the pulmonologist also.  He is doing what he can to help her.  His daughter's birthday is March 11 so he knows that is a difficult time for both he and his wife next week.  Typically they do something to remember her but because of the wife's current health issues they cannot go to [place] as they had planned so they are talking about what to do to remember and celebrate her birthday.  We will continue to work on coping skills for helping with his anxiety and the progression of his Lewy body dementia diagnosis as well as processing his grief. Mental Status Exam: Appearance:  Well Groomed     Behavior: Appropriate  Motor: Pt. Reports some instability due to Lewey Body dementia  Speech/Language:  Normal Rate  Affect: Appropriate  Mood: normal  Thought process: normal  Thought content:   WNL  Sensory/Perceptual disturbances:   WNL  Orientation: oriented to person, place, and situation  Attention: Good  Concentration: Good  Memory: Impaired short term  Fund of knowledge:  Good  Insight:   Good  Judgment:  Good  Impulse Control: Good   Risk Assessment: Danger to Self:  No Self-injurious Behavior: No Danger to Others: No Duty to Warn:no Physical Aggression / Violence:No  Access to Firearms a concern: No  Gang Involvement:No  Treatment plan: Will employ cognitive behavioral therapy as well as grief and loss therapy for relief of anxiety and grief.  Goals of grief therapy or to have a healthier response to the loss of his daughter  and less sadness as evidenced by patient report in therapy notes as well as to help him feel less overwhelmed by her loss.  Goals are to see a 50% reduction in grief symptoms over the next 6 months.  I will encourage the use of the patient telling his grief story about his daughter including encouraging him to bring pictures and memorabilia to help process his feelings including any feelings of guilt associated with her loss.  We will use cognitive behavioral therapy to help identify and change anxiety producing thoughts and behavior patterns so as to improve his ability to better manage anxiety and stress, and manage thoughts and worrisome thinking contributing  to anxiety.  We will also refresh and encourage dialectical behavior therapy distress tolerance and mindfulness skills with the intention of reducing anxiety by 50% over the next 6 months. Progress: 30% Interventions: Cognitive Behavioral Therapy  Diagnosis:PTSD, Bi-Polar D/O  Plan: F/U/ appointments scheduled weekly.  Lorrene CHRISTELLA Hasten, [facility]                                                                                                                   Lorrene CHRISTELLA Hasten,  [facility]  [place] [facility] Counselor/Therapist Progress Note  Patient ID: Uchechukwu Dhawan, MRN: [id number],    Date: 04/29/2024 This session was held via video teletherapy. The patient consented to the video teletherapy and was located in his home during this session. He is aware it is the responsibility of the patient to secure confidentiality on his end of the session. The provider was in a private home office for the duration of this session.    The patient arrived on time for her Caregility session  Time Spent: 58 minutes, 2 PM to 2:58 PM  Treatment Type: Individual Therapy Reported Symptoms: anxiety, panic attacks A fairly difficult week because it was his daughter's birthday.  He said this year was more  difficult than last year but he could not pinpoint why.  They did remember her about going out to a nice restaurant which is something her daughter always wanted to do.  He said they were able to laugh and joke about funny things that his daughter had said or done which is not something they were able to do this time last year so he saw that as healthy grieving.  He knows that the time change threw things off a little bit for everybody and his wife still is not feeling well and his back has been bothering him.  He was not able to go to his first round of physical therapy this week because his back was bothering him more.  He has been thinking a lot more about his daughter which he feels comfortable with.  He is thankful that it is getting warmer and staying light longer so he can get back to some of the coping skills such as fishing that he has done in the past.   We will continue to work on coping skills for helping with his anxiety and the progression of his Lewy body dementia diagnosis as well as processing his grief. Mental Status Exam: Appearance:  Well Groomed     Behavior: Appropriate  Motor: Pt. Reports some instability due to Lewey Body dementia  Speech/Language:  Normal Rate  Affect: Appropriate  Mood: normal  Thought process: normal  Thought content:   WNL  Sensory/Perceptual disturbances:   WNL  Orientation: oriented to person, place, and situation  Attention: Good  Concentration: Good  Memory: Impaired short term  Fund of knowledge:  Good  Insight:   Good  Judgment:  Good  Impulse Control: Good   Risk Assessment: Danger to Self:  No Self-injurious Behavior: No Danger to Others: No Duty to Warn:no Physical Aggression / Violence:No  Access to Firearms a concern: No  Gang Involvement:No  Treatment plan: Will employ cognitive behavioral therapy as well as grief and loss therapy for relief of anxiety and grief.  Goals of grief therapy or to have a healthier response to the loss  of his daughter and less sadness as evidenced by patient report in therapy notes as well as to help him feel less overwhelmed by her loss.  Goals are to see a 50% reduction in grief symptoms over the next 6 months.  I will encourage the use of the patient telling his grief story about his daughter including encouraging him to bring pictures and memorabilia to help process his feelings including any feelings of guilt associated with her loss.  We will use cognitive behavioral therapy to help identify and change anxiety producing thoughts and behavior patterns so as to improve his ability to better manage anxiety and stress, and manage thoughts  and worrisome thinking contributing to anxiety.  We will also refresh and encourage dialectical behavior therapy distress tolerance and mindfulness skills with the intention of reducing anxiety by 50% over the next 6 months. Progress: 30% Interventions: Cognitive Behavioral Therapy  Diagnosis:PTSD, Bi-Polar D/O  Plan: F/U/ appointments scheduled weekly.  Lorrene CHRISTELLA Hasten, [facility]                                                                                                                   Lorrene CHRISTELLA Hasten, [facility]               Lorrene CHRISTELLA Hasten, [facility]               Lorrene CHRISTELLA Hasten,  [facility]  [place] [facility] Counselor/Therapist Progress Note  Patient ID: Amine Adelson, MRN: [id number],    Date: 04/29/2024 This session was held via video teletherapy. The patient consented to the video teletherapy and was located in his home during this session. He is aware it is the responsibility of the patient to secure confidentiality on his end of the session. The provider was in a private home office for the duration of this session.    The patient arrived on time for her Caregility session  Time Spent: 57 minutes, 2 PM to 2:57 PM  Treatment Type: Individual Therapy Reported Symptoms: anxiety, panic attacks The patient is still having significant back pain.  He cannot sit anywhere for very long and has to be  careful where he sits.  There is still some unsteadiness although he did not report any falls over the past 2 weeks.  His biggest concern now is for his wife.  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  She is meeting with her cardiologist again next week to see what possible treatment is for her.  He still thinks there may be some issue with her diaphragm but she is meeting with the pulmonologist also.  He is doing what he can to help her.  His daughter's birthday is March 11 so he knows that is a difficult time for both he and his wife next week.  Typically they do something to remember her but because of the wife's current health issues they cannot go to [place] as they had planned so they are talking about what to do to remember and celebrate her birthday.  We will continue to work on coping skills for helping with his anxiety and the progression of his Lewy body dementia diagnosis as well as processing his grief. Mental Status Exam: Appearance:  Well Groomed     Behavior: Appropriate  Motor: Pt. Reports some instability due to Lewey Body dementia  Speech/Language:  Normal Rate  Affect: Appropriate  Mood: normal  Thought process: normal  Thought content:   WNL  Sensory/Perceptual disturbances:   WNL  Orientation: oriented to person, place, and situation  Attention: Good  Concentration: Good  Memory: Impaired short term  Fund of knowledge:  Good  Insight:   Good  Judgment:  Good  Impulse Control: Good   Risk Assessment: Danger to Self:  No Self-injurious Behavior: No Danger to Others: No Duty to Warn:no Physical Aggression / Violence:No  Access to Firearms a concern: No  Gang Involvement:No  Treatment plan: Will employ cognitive behavioral therapy as well as grief and loss therapy for relief of anxiety and grief.  Goals of grief therapy or to have a healthier response to the loss of his daughter  and less sadness as evidenced by patient report in therapy notes as well as to help him feel less overwhelmed by her loss.  Goals are to see a 50% reduction in grief symptoms over the next 6 months.  I will encourage the use of the patient telling his grief story about his daughter including encouraging him to bring pictures and memorabilia to help process his feelings including any feelings of guilt associated with her loss.  We will use cognitive behavioral therapy to help identify and change anxiety producing thoughts and behavior patterns so as to improve his ability to better manage anxiety and stress, and manage thoughts and worrisome thinking contributing  to anxiety.  We will also refresh and encourage dialectical behavior therapy distress tolerance and mindfulness skills with the intention of reducing anxiety by 50% over the next 6 months. Progress: 30% Interventions: Cognitive Behavioral Therapy  Diagnosis:PTSD, Bi-Polar D/O  Plan: F/U/ appointments scheduled weekly.  Lorrene CHRISTELLA Hasten, [facility]                                                                                                                   Lorrene CHRISTELLA Hasten,  [facility]  [place] [facility] Counselor/Therapist Progress Note  Patient ID: Carlon Chaloux, MRN: [id number],    Date: 04/29/2024 This session was held via video teletherapy. The patient consented to the video teletherapy and was located in his home during this session. He is aware it is the responsibility of the patient to secure confidentiality on his end of the session. The provider was in a private home office for the duration of this session.    The patient arrived on time for her Caregility session  Time Spent: 57 minutes, 2 PM to 2:57 PM  Treatment Type: Individual Therapy Reported Symptoms: anxiety, panic attacks  The patient reported that he had not been sleeping well so he did speak with his psychiatrist  about adjusting some meds.  Initially they adjusted too much up and he said he felt groggy for the entire next day but they have found a combination now where he feels like he is getting more rested and only feels a little drowsy shortly after waking up..  There have been a couple times where he stumbled and one of the times he felt down but did not get hurt the other times he was able to catch himself.  He is trying to stay as active as he can walking daily and fishing with his wife when he can.  His biggest anxiety now is his wife has an upcoming surgery on her diaphragm in 2 weeks so he knows that will be a fairly significant recovery time but also sees the difficulties she is having now and wants her to get some relief..  His son and daughter-in-law will be supports for them also.  He continues to speak with friends and family members as encouragement.  He also feels that he is grieving appropriately writing to his daughter on a regular basis about the things that are going on in his and his wife's lives.  We will continue to work on coping skills for helping with his anxiety and the progression of his Lewy body dementia diagnosis as well as processing his grief. Mental Status Exam: Appearance:  Well Groomed     Behavior: Appropriate  Motor: Pt. Reports some instability due to Lewey Body dementia  Speech/Language:  Normal Rate  Affect: Appropriate  Mood: normal  Thought process: normal  Thought content:   WNL  Sensory/Perceptual disturbances:   WNL  Orientation: oriented to person, place, and situation  Attention: Good  Concentration: Good  Memory: Impaired short term  Fund of knowledge:  Good  Insight:   Good  Judgment:  Good  Impulse Control: Good   Risk Assessment: Danger to Self:  No Self-injurious Behavior: No Danger to Others: No Duty to Warn:no Physical Aggression / Violence:No  Access to Firearms a concern: No  Gang Involvement:No  Treatment plan: Will employ cognitive  behavioral therapy as well as grief and loss therapy for relief of anxiety and grief.  Goals of grief therapy or to have a healthier response to the loss of his daughter and less sadness as evidenced by patient report in therapy notes as well as to help him feel less overwhelmed by her loss.  Goals are to see a 50% reduction in grief symptoms over the next 6 months.  I will encourage the use of the patient telling his grief story about his daughter including encouraging him to bring pictures and memorabilia to help process his feelings including any feelings of guilt associated with her loss.  We will use cognitive behavioral therapy to  help identify and change anxiety producing thoughts and behavior patterns so as to improve his ability to better manage anxiety and stress, and manage thoughts and worrisome thinking contributing to anxiety.  We will also refresh and encourage dialectical behavior therapy distress tolerance and mindfulness skills with the intention of reducing anxiety by 50% over the next 6 months. Progress: 30% Interventions: Cognitive Behavioral Therapy  Diagnosis:PTSD, Bi-Polar D/O  Plan: F/U/ appointments scheduled weekly.  Lorrene CHRISTELLA Hasten, [facility]                                                                                                                   Lorrene CHRISTELLA Hasten, [facility]               Lorrene CHRISTELLA Hasten, [facility]               Lorrene CHRISTELLA Hasten, [facility]               Lorrene CHRISTELLA Hasten, [facility]               Lorrene CHRISTELLA Hasten, [facility]               Lorrene CHRISTELLA Hasten, [facility]               Lorrene CHRISTELLA Hasten, [facility]               Lorrene CHRISTELLA Hasten,  [facility]  [place] [facility] Counselor/Therapist Progress Note  Patient ID: Stephen Myers, MRN: [id number],    Date: 04/29/2024 This session was held via video teletherapy. The patient consented to the video teletherapy and was located in his home during this session. He is aware it is the responsibility of the patient to secure confidentiality on his end of the session. The provider was in a private home office for the duration of this session.    The patient arrived on time for her Caregility session  Time Spent: 59 minutes, 2 PM to 2:59 PM  Treatment Type: Individual Therapy Reported Symptoms: anxiety, panic attacks The patient said he jokingly said to his wife that he had not had any pain issues in the cervical area and  then a few days ago he started having pain in his neck and shoulders.  He now sits related to degenerative disk and has some consistent pain somewhere in his spine with that but jokingly says it just depends on Wednesday which part of his body is decides to show up and hurt.  He is doing the best that he can.  He is still walking which he knows is good for him but does not have right now the strength to do as much in the morning as he has done in the past so he is denying that to 3 times a day for consistency.  He is still doing some positive things and that he is staying in touch with his brother and other family members.  He is reconnecting with friends from high school and he is which she is enjoying.  One of them has had a similar experience in that she lost her son in the same way the patient lost his daughter.  He said they did not think and Easter in which they set a place for his daughter at the table and put her picture there.  He initially had reservations about doing that he and his wife had made everyone feel as if she was really there with him.  He still feels that he is grieving in a healthy way.  He and his wife have always taken a trip around both of their birthdays which are 2 days apart.  That is now close to his daughter died so they have started taking some of her rashes with him and sprinkling them where they go as a way of remembrance.  They are deciding what that looks like this year in July.  He does contract for safety having no thoughts of hurting himself or anyone else. We will continue to work on coping skills for helping with his anxiety and the progression of his Lewy body dementia diagnosis as well as processing his grief. Mental Status Exam: Appearance:  Well Groomed     Behavior: Appropriate  Motor: Pt. Reports some instability due to Lewey Body dementia  Speech/Language:  Normal Rate  Affect: Appropriate  Mood: normal  Thought process: normal  Thought content:   WNL   Sensory/Perceptual disturbances:   WNL  Orientation: oriented to person, place, and situation  Attention: Good  Concentration: Good  Memory: Impaired short term  Fund of knowledge:  Good  Insight:   Good  Judgment:  Good  Impulse Control: Good   Risk Assessment: Danger to Self:  No Self-injurious Behavior: No Danger to Others: No Duty to Warn:no Physical Aggression / Violence:No  Access to Firearms a concern: No  Gang Involvement:No  Treatment plan: Will employ cognitive behavioral therapy as  well as grief and loss therapy for relief of anxiety and grief.  Goals of grief therapy or to have a healthier response to the loss of his daughter and less sadness as evidenced by patient report in therapy notes as well as to help him feel less overwhelmed by her loss.  Goals are to see a 50% reduction in grief symptoms over the next 6 months.  I will encourage the use of the patient telling his grief story about his daughter including encouraging him to bring pictures and memorabilia to help process his feelings including any feelings of guilt associated with her loss.  We will use cognitive behavioral therapy to help identify and change anxiety producing thoughts and behavior patterns so as to improve his ability to better manage anxiety and stress, and manage thoughts and worrisome thinking contributing to anxiety.  We will also refresh and encourage dialectical behavior therapy distress tolerance and mindfulness skills with the intention of reducing anxiety by 50% over the next 6 months. Progress: 30% Interventions: Cognitive Behavioral Therapy  Diagnosis:PTSD, Bi-Polar D/O  Plan: F/U/ appointments scheduled weekly.  Lorrene CHRISTELLA Hasten, [facility]                                                                                                                   Lorrene CHRISTELLA Hasten,  [facility]  [place] [facility] Counselor/Therapist Progress Note  Patient ID: Keawe Marcello, MRN: [id number],    Date: 04/29/2024 This session was held via video teletherapy. The patient consented to the video teletherapy and was located in his home during this session. He is aware it is the responsibility of the patient to secure confidentiality on his end of the session. The provider was in a private home office for the duration of this session.    The patient arrived on time for her Caregility session  Time Spent: 58 minutes, 2 PM to 2:58 PM  Treatment Type: Individual Therapy Reported Symptoms: anxiety, panic attacks A fairly difficult week because it was his daughter's birthday.  He said this year was more  difficult than last year but he could not pinpoint why.  They did remember her about going out to a nice restaurant which is something her daughter always wanted to do.  He said they were able to laugh and joke about funny things that his daughter had said or done which is not something they were able to do this time last year so he saw that as healthy grieving.  He knows that the time change threw things off a little bit for everybody and his wife still is not feeling well and his back has been bothering him.  He was not able to go to his first round of physical therapy this week because his back was bothering him more.  He has been thinking a lot more about his daughter which he feels comfortable with.  He is thankful that it is getting warmer and staying light longer so he can get back to some of the coping skills such as fishing that he has done in the past.   We will continue to work on coping skills for helping with his anxiety and the progression of his Lewy body dementia diagnosis as well as processing his grief. Mental Status Exam: Appearance:  Well Groomed     Behavior: Appropriate  Motor: Pt. 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  Speech/Language:  Normal Rate  Affect: Appropriate  Mood: normal  Thought process: normal  Thought content:   WNL  Sensory/Perceptual disturbances:   WNL  Orientation: oriented to person, place, and situation  Attention: Good  Concentration: Good  Memory: Impaired short term  Fund of knowledge:  Good  Insight:   Good  Judgment:  Good  Impulse Control: Good   Risk Assessment: Danger to Self:  No Self-injurious Behavior: No Danger to Others: No Duty to Warn:no Physical Aggression / Violence:No  Access to Firearms a concern: No  Gang Involvement:No  Treatment plan: Will employ cognitive behavioral therapy as well as grief and loss therapy for relief of anxiety and grief.  Goals of grief therapy or to have a healthier response to the loss  of his daughter and less sadness as evidenced by patient report in therapy notes as well as to help him feel less overwhelmed by her loss.  Goals are to see a 50% reduction in grief symptoms over the next 6 months.  I will encourage the use of the patient telling his grief story about his daughter including encouraging him to bring pictures and memorabilia to help process his feelings including any feelings of guilt associated with her loss.  We will use cognitive behavioral therapy to help identify and change anxiety producing thoughts and behavior patterns so as to improve his ability to better manage anxiety and stress, and manage thoughts  and worrisome thinking contributing to anxiety.  We will also refresh and encourage dialectical behavior therapy distress tolerance and mindfulness skills with the intention of reducing anxiety by 50% over the next 6 months. Progress: 30% Interventions: Cognitive Behavioral Therapy  Diagnosis:PTSD, Bi-Polar D/O  Plan: F/U/ appointments scheduled weekly.  Lorrene CHRISTELLA Hasten, [facility]                                                                                                                   Lorrene CHRISTELLA Hasten, [facility]               Lorrene CHRISTELLA Hasten, [facility]               Lorrene CHRISTELLA Hasten,  [facility]  [place] [facility] Counselor/Therapist Progress Note  Patient ID: Stephen Myers, MRN: [id number],    Date: 04/29/2024 This session was held via video teletherapy. The patient consented to the video teletherapy and was located in his home during this session. He is aware it is the responsibility of the patient to secure confidentiality on his end of the session. The provider was in a private home office for the duration of this session.    The patient arrived on time for her Caregility session  Time Spent: 1:05 PM until 2 PM, 55 minutes Treatment Type: Individual Therapy Reported Symptoms: anxiety, panic attacks The patient said he came in from his walk this morning and saw what he thought was some sort of tic from his wife.  It  was not the first time it happened and he wonders if it might be tardive dyskinesia because of some new medications that she is on.  She has a call into the doctor right now and hopes to hear something back soon.  They have narrowed down to the fact they think most of her discomfort in breathing is coming from the diaphragm and they are looking for alternatives for treatment.  The patient continues to do those things which help him cope including walking, fishing when he can, spending time with his son and daughter-in-law and grandson.  He also has a way of continued coping with grief he is writing letters to his daughters as a way of expressing his feelings and says that is very beneficial.  He appears to be grieving in a healthy way but he knows that he to year anniversary of her death is coming up and there is some grief there but says it is not overwhelming.  We will continue to work on coping skills for helping with his anxiety and the progression of his Lewy body dementia diagnosis as well as processing his grief. Mental Status Exam: Appearance:  Well Groomed     Behavior: Appropriate  Motor: Pt. Reports some instability due to Lewey Body dementia  Speech/Language:  Normal Rate  Affect: Appropriate  Mood: normal  Thought process: normal  Thought content:   WNL  Sensory/Perceptual disturbances:   WNL  Orientation: oriented to person, place, and situation  Attention: Good  Concentration: Good  Memory: Impaired short term  Fund of knowledge:  Good  Insight:   Good  Judgment:  Good  Impulse Control: Good   Risk Assessment: Danger to Self:  No Self-injurious Behavior: No Danger to Others: No Duty to Warn:no Physical Aggression / Violence:No  Access to Firearms a concern: No  Gang Involvement:No  Treatment plan: Will employ cognitive behavioral therapy as well as grief and loss therapy for relief of anxiety and grief.  Goals of grief therapy or to have a healthier response to the loss  of his daughter and less sadness as evidenced by patient report in therapy notes as well as to help him feel less overwhelmed by her loss.  Goals are to see a 50% reduction in grief symptoms over the next 6 months.  I will encourage the use of the patient telling his grief story about his daughter including encouraging him to bring pictures and memorabilia to help process his feelings including any feelings of guilt associated with her loss.  We will use cognitive behavioral therapy to help identify and change anxiety producing thoughts and behavior patterns so as to improve his ability to better manage anxiety and stress, and manage thoughts and worrisome  thinking contributing to anxiety.  We will also refresh and encourage dialectical behavior therapy distress tolerance and mindfulness skills with the intention of reducing anxiety by 50% over the next 6 months. Progress: 30% Interventions: Cognitive Behavioral Therapy  Diagnosis:PTSD, Bi-Polar D/O  Plan: F/U/ appointments scheduled weekly.  Lorrene CHRISTELLA Hasten, [facility]                                                                                                                   Lorrene CHRISTELLA Hasten,  [facility]  [place] [facility] Counselor/Therapist Progress Note  Patient ID: Kato Wieczorek, MRN: [id number],    Date: 04/29/2024 This session was held via video teletherapy. The patient consented to the video teletherapy and was located in his home during this session. He is aware it is the responsibility of the patient to secure confidentiality on his end of the session. The provider was in a private home office for the duration of this session.    The patient arrived on time for her Caregility session  Time Spent: 59 minutes, 2 PM to 2:59 PM  Treatment Type: Individual Therapy Reported Symptoms: anxiety, panic attacks The patient continues to present with physical pain and both his back and his knee.  He started  with a new physical therapist and told him that there was a lot of arthritis in both knees and or certain things he cannot do but they had him do that anyway and now for the past 3 days he has been in significant knee pain.  He has continued to walk because he knows that is good for him in various ways but says it has been painful.  His wife also was not feeling well but she is going back to the doctor next week to have an ablation and hopes that will bring her some relief.  For the most part he has been at the last couple weeks reaching back out and hearing from some old friends as well as time with his son and son's family.  He says that his wife's cousin is coming down for a weekend soon and they enjoy time with her and that he will go to his brother's house sometime in the next few weeks.  He recognizes the need for people that he cares about in helping with his depression. We will continue to work on coping skills for helping with his anxiety and the progression of his Lewy body dementia diagnosis as well as processing his grief. Mental Status Exam: Appearance:  Well Groomed     Behavior: Appropriate  Motor: Pt. Reports some instability due to Lewey Body dementia  Speech/Language:  Normal Rate  Affect: Appropriate  Mood: normal  Thought process: normal  Thought content:   WNL  Sensory/Perceptual disturbances:   WNL  Orientation: oriented to person, place, and situation  Attention: Good  Concentration: Good  Memory: Impaired short term  Fund of knowledge:  Good  Insight:   Good  Judgment:  Good  Impulse Control: Good   Risk Assessment: Danger to Self:  No Self-injurious Behavior: No Danger to Others: No Duty to Warn:no Physical Aggression / Violence:No  Access to Firearms a concern: No  Gang Involvement:No  Treatment plan: Will employ cognitive behavioral therapy as well as grief and loss therapy for relief of anxiety and grief.  Goals of grief therapy or to have a healthier response  to the loss of his daughter and less sadness as evidenced by patient report in therapy notes as well as to help him feel less overwhelmed by her loss.  Goals are to see a 50% reduction in grief symptoms over the next 6 months.  I will encourage the use of the patient telling his grief story about his daughter including encouraging him to bring pictures and memorabilia to help process his feelings including any feelings of guilt associated with her loss.  We will use cognitive behavioral therapy to help identify and change anxiety producing thoughts and behavior patterns so as to improve his ability to better  manage anxiety and stress, and manage thoughts and worrisome thinking contributing to anxiety.  We will also refresh and encourage dialectical behavior therapy distress tolerance and mindfulness skills with the intention of reducing anxiety by 50% over the next 6 months. Progress: 30% Interventions: Cognitive Behavioral Therapy  Diagnosis:PTSD, Bi-Polar D/O  Plan: F/U/ appointments scheduled weekly.  Lorrene CHRISTELLA Hasten, [facility]                                                                                                                   Lorrene CHRISTELLA Hasten, [facility]               Lorrene CHRISTELLA Hasten, [facility]               Lorrene CHRISTELLA Hasten, [facility]               Lorrene CHRISTELLA Hasten,  [facility]  [place] [facility] Counselor/Therapist Progress Note  Patient ID: Newton Frutiger, MRN: [id number],    Date: 04/29/2024 This session was held via video teletherapy. The patient consented to the video teletherapy and was located in his home during this session. He is aware it is the responsibility of the patient to secure confidentiality on his end of the session. The provider was in a private home office for the duration of this session.    The patient arrived on time for her Caregility session  Time Spent: 57 minutes, 2 PM to 2:57 PM  Treatment Type: Individual Therapy Reported Symptoms: anxiety, panic attacks The patient is still having significant back pain.  He cannot sit anywhere for very long and has to be  careful where he sits.  There is still some unsteadiness although he did not report any falls over the past 2 weeks.  His biggest concern now is for his wife.  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  She is meeting with her cardiologist again next week to see what possible treatment is for her.  He still thinks there may be some issue with her diaphragm but she is meeting with the pulmonologist also.  He is doing what he can to help her.  His daughter's birthday is March 11 so he knows that is a difficult time for both he and his wife next week.  Typically they do something to remember her but because of the wife's current health issues they cannot go to [place] as they had planned so they are talking about what to do to remember and celebrate her birthday.  We will continue to work on coping skills for helping with his anxiety and the progression of his Lewy body dementia diagnosis as well as processing his grief. Mental Status Exam: Appearance:  Well Groomed     Behavior: Appropriate  Motor: Pt. Reports some instability due to Lewey Body dementia  Speech/Language:  Normal Rate  Affect: Appropriate  Mood: normal  Thought process: normal  Thought content:   WNL  Sensory/Perceptual disturbances:   WNL  Orientation: oriented to person, place, and situation  Attention: Good  Concentration: Good  Memory: Impaired short term  Fund of knowledge:  Good  Insight:   Good  Judgment:  Good  Impulse Control: Good   Risk Assessment: Danger to Self:  No Self-injurious Behavior: No Danger to Others: No Duty to Warn:no Physical Aggression / Violence:No  Access to Firearms a concern: No  Gang Involvement:No  Treatment plan: Will employ cognitive behavioral therapy as well as grief and loss therapy for relief of anxiety and grief.  Goals of grief therapy or to have a healthier response to the loss of his daughter  and less sadness as evidenced by patient report in therapy notes as well as to help him feel less overwhelmed by her loss.  Goals are to see a 50% reduction in grief symptoms over the next 6 months.  I will encourage the use of the patient telling his grief story about his daughter including encouraging him to bring pictures and memorabilia to help process his feelings including any feelings of guilt associated with her loss.  We will use cognitive behavioral therapy to help identify and change anxiety producing thoughts and behavior patterns so as to improve his ability to better manage anxiety and stress, and manage thoughts and worrisome thinking contributing  to anxiety.  We will also refresh and encourage dialectical behavior therapy distress tolerance and mindfulness skills with the intention of reducing anxiety by 50% over the next 6 months. Progress: 30% Interventions: Cognitive Behavioral Therapy  Diagnosis:PTSD, Bi-Polar D/O  Plan: F/U/ appointments scheduled weekly.  Lorrene CHRISTELLA Hasten, [facility]                                                                                                                   Lorrene CHRISTELLA Hasten,  [facility]  [place] [facility] Counselor/Therapist Progress Note  Patient ID: Cashis Rill, MRN: [id number],    Date: 04/29/2024 This session was held via video teletherapy. The patient consented to the video teletherapy and was located in his home during this session. He is aware it is the responsibility of the patient to secure confidentiality on his end of the session. The provider was in a private home office for the duration of this session.    The patient arrived on time for her Caregility session  Time Spent: 58 minutes, 2 PM to 2:58 PM  Treatment Type: Individual Therapy Reported Symptoms: anxiety, panic attacks A fairly difficult week because it was his daughter's birthday.  He said this year was more  difficult than last year but he could not pinpoint why.  They did remember her about going out to a nice restaurant which is something her daughter always wanted to do.  He said they were able to laugh and joke about funny things that his daughter had said or done which is not something they were able to do this time last year so he saw that as healthy grieving.  He knows that the time change threw things off a little bit for everybody and his wife still is not feeling well and his back has been bothering him.  He was not able to go to his first round of physical therapy this week because his back was bothering him more.  He has been thinking a lot more about his daughter which he feels comfortable with.  He is thankful that it is getting warmer and staying light longer so he can get back to some of the coping skills such as fishing that he has done in the past.   We will continue to work on coping skills for helping with his anxiety and the progression of his Lewy body dementia diagnosis as well as processing his grief. Mental Status Exam: Appearance:  Well Groomed     Behavior: Appropriate  Motor: Pt. Reports some instability due to Lewey Body dementia  Speech/Language:  Normal Rate  Affect: Appropriate  Mood: normal  Thought process: normal  Thought content:   WNL  Sensory/Perceptual disturbances:   WNL  Orientation: oriented to person, place, and situation  Attention: Good  Concentration: Good  Memory: Impaired short term  Fund of knowledge:  Good  Insight:   Good  Judgment:  Good  Impulse Control: Good   Risk Assessment: Danger to Self:  No Self-injurious Behavior: No Danger to Others: No Duty to Warn:no Physical Aggression / Violence:No  Access to Firearms a concern: No  Gang Involvement:No  Treatment plan: Will employ cognitive behavioral therapy as well as grief and loss therapy for relief of anxiety and grief.  Goals of grief therapy or to have a healthier response to the loss  of his daughter and less sadness as evidenced by patient report in therapy notes as well as to help him feel less overwhelmed by her loss.  Goals are to see a 50% reduction in grief symptoms over the next 6 months.  I will encourage the use of the patient telling his grief story about his daughter including encouraging him to bring pictures and memorabilia to help process his feelings including any feelings of guilt associated with her loss.  We will use cognitive behavioral therapy to help identify and change anxiety producing thoughts and behavior patterns so as to improve his ability to better manage anxiety and stress, and manage thoughts  and worrisome thinking contributing to anxiety.  We will also refresh and encourage dialectical behavior therapy distress tolerance and mindfulness skills with the intention of reducing anxiety by 50% over the next 6 months. Progress: 30% Interventions: Cognitive Behavioral Therapy  Diagnosis:PTSD, Bi-Polar D/O  Plan: F/U/ appointments scheduled weekly.  Lorrene CHRISTELLA Hasten, [facility]                                                                                                                   Lorrene CHRISTELLA Hasten, [facility]               Lorrene CHRISTELLA Hasten, [facility]               Lorrene CHRISTELLA Hasten,  [facility]  [place] [facility] Counselor/Therapist Progress Note  Patient ID: Merick Kelleher, MRN: [id number],    Date: 04/29/2024 This session was held via video teletherapy. The patient consented to the video teletherapy and was located in his home during this session. He is aware it is the responsibility of the patient to secure confidentiality on his end of the session. The provider was in a private home office for the duration of this session.    The patient arrived on time for her Caregility session  Time Spent: 57 minutes, 2 PM to 2:57 PM  Treatment Type: Individual Therapy Reported Symptoms: anxiety, panic attacks The patient is still having significant back pain.  He cannot sit anywhere for very long and has to be  careful where he sits.  There is still some unsteadiness although he did not report any falls over the past 2 weeks.  His biggest concern now is for his wife.  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  She is meeting with her cardiologist again next week to see what possible treatment is for her.  He still thinks there may be some issue with her diaphragm but she is meeting with the pulmonologist also.  He is doing what he can to help her.  His daughter's birthday is March 11 so he knows that is a difficult time for both he and his wife next week.  Typically they do something to remember her but because of the wife's current health issues they cannot go to [place] as they had planned so they are talking about what to do to remember and celebrate her birthday.  We will continue to work on coping skills for helping with his anxiety and the progression of his Lewy body dementia diagnosis as well as processing his grief. Mental Status Exam: Appearance:  Well Groomed     Behavior: Appropriate  Motor: Pt. Reports some instability due to Lewey Body dementia  Speech/Language:  Normal Rate  Affect: Appropriate  Mood: normal  Thought process: normal  Thought content:   WNL  Sensory/Perceptual disturbances:   WNL  Orientation: oriented to person, place, and situation  Attention: Good  Concentration: Good  Memory: Impaired short term  Fund of knowledge:  Good  Insight:   Good  Judgment:  Good  Impulse Control: Good   Risk Assessment: Danger to Self:  No Self-injurious Behavior: No Danger to Others: No Duty to Warn:no Physical Aggression / Violence:No  Access to Firearms a concern: No  Gang Involvement:No  Treatment plan: Will employ cognitive behavioral therapy as well as grief and loss therapy for relief of anxiety and grief.  Goals of grief therapy or to have a healthier response to the loss of his daughter  and less sadness as evidenced by patient report in therapy notes as well as to help him feel less overwhelmed by her loss.  Goals are to see a 50% reduction in grief symptoms over the next 6 months.  I will encourage the use of the patient telling his grief story about his daughter including encouraging him to bring pictures and memorabilia to help process his feelings including any feelings of guilt associated with her loss.  We will use cognitive behavioral therapy to help identify and change anxiety producing thoughts and behavior patterns so as to improve his ability to better manage anxiety and stress, and manage thoughts and worrisome thinking contributing  to anxiety.  We will also refresh and encourage dialectical behavior therapy distress tolerance and mindfulness skills with the intention of reducing anxiety by 50% over the next 6 months. Progress: 30% Interventions: Cognitive Behavioral Therapy  Diagnosis:PTSD, Bi-Polar D/O  Plan: F/U/ appointments scheduled weekly.  Lorrene CHRISTELLA Hasten, [facility]                                                                                                                   Lorrene CHRISTELLA Hasten,  [facility]  [place] [facility] Counselor/Therapist Progress Note  Patient ID: Tyresse Jayson, MRN: [id number],    Date: 04/29/2024 This session was held via video teletherapy. The patient consented to the video teletherapy and was located in his home during this session. He is aware it is the responsibility of the patient to secure confidentiality on his end of the session. The provider was in a private home office for the duration of this session.    The patient arrived on time for her Caregility session  Time Spent: 57 minutes, 2 PM to 2:57 PM  Treatment Type: Individual Therapy Reported Symptoms: anxiety, panic attacks  We will continue to work on coping skills for helping with his anxiety and the progression of  his Lewy body dementia diagnosis as well as processing his grief. Mental Status Exam: Appearance:  Well Groomed     Behavior: Appropriate  Motor: Pt. Reports some instability due to Lewey Body dementia  Speech/Language:  Normal Rate  Affect: Appropriate  Mood: normal  Thought process: normal  Thought content:   WNL  Sensory/Perceptual disturbances:   WNL  Orientation: oriented to person, place, and situation  Attention: Good  Concentration: Good  Memory: Impaired short term  Fund of knowledge:  Good  Insight:   Good  Judgment:  Good  Impulse Control: Good   Risk Assessment: Danger to Self:  No Self-injurious Behavior: No Danger to Others: No Duty to Warn:no Physical Aggression / Violence:No  Access to Firearms a concern: No  Gang Involvement:No  Treatment plan: Will employ cognitive behavioral therapy as well as grief and loss therapy for relief of anxiety and grief.  Goals of grief therapy or to have a healthier response to the loss of his daughter and less sadness as evidenced by patient report in therapy notes as well as to help him feel less overwhelmed by her loss.  Goals are to see a 50% reduction in grief symptoms over the next 6 months.  I will encourage the use of the patient telling his grief story about his daughter including encouraging him to bring pictures and memorabilia to help process his feelings including any feelings of guilt associated with her loss.  We will use cognitive behavioral therapy to help identify and change anxiety producing thoughts and behavior patterns so as to improve his ability to better manage anxiety and stress, and manage thoughts and worrisome thinking contributing to anxiety.  We will also refresh and encourage dialectical behavior therapy distress tolerance and mindfulness skills with the intention of reducing anxiety by 50% over the next 6 months. Progress: 30% Interventions: Cognitive Behavioral Therapy  Diagnosis:PTSD, Bi-Polar  D/O  Plan: F/U/ appointments scheduled weekly.  Lorrene CHRISTELLA Hasten, [facility]                                                                                                                   Lorrene CHRISTELLA Hasten, [facility]               Lorrene CHRISTELLA Hasten, [facility]  Lorrene CHRISTELLA Hasten, [facility]               Lorrene CHRISTELLA Hasten, [facility]               Lorrene CHRISTELLA Hasten, [facility]               Lorrene CHRISTELLA Hasten, [facility]               Lorrene CHRISTELLA Hasten, [facility]  [place] [facility] Counselor/Therapist Progress Note  Patient ID: Rylon Poitra, MRN: [id number],    Date:  04/29/2024 This session was held via video teletherapy. The patient consented to the video teletherapy and was located in his home during this session. He is aware it is the responsibility of the patient to secure confidentiality on his end of the session. The provider was in a private home office for the duration of this session.    The patient arrived on time for her Caregility session  Time Spent: 39 minutes, 2 PM to 2:39 PM  Treatment Type: Individual Therapy Reported Symptoms: anxiety, panic attacks The patient was not feeling well today.  He started feeling really earlier in the week having a fever bad headaches body aches.  He felt a little better yesterday but feels a little bit of a resurgence in headaches and body aches and is running a low-grade fever.  His wife is feeling some of the same symptoms.  For the most part he says he is doing well.  They did increase his memory medication because they were starting to see some increasing decline in short-term memory and he said until recently his long-term memory had been good but he was starting to see a difference in how he used to markers to remember what happened at certain times historically.  He feels the medication has helped and has brightened his mood a little also.  He is still trying to walk every day and fish when he can and has enjoyed watching the [employer] as a good distraction.  He reports that he feels he is managing mood fairly well.  He has an appointment in 2 weeks.  He does contract for safety having no thoughts of hurting himself or anyone else. We will continue to work on coping skills for helping with his anxiety and the progression of his Lewy body dementia diagnosis as well as processing his grief. Mental Status Exam: Appearance:  Well Groomed     Behavior: Appropriate  Motor: Pt. Reports some instability due to Lewey Body dementia  Speech/Language:  Normal Rate  Affect: Appropriate  Mood: normal   Thought process: normal  Thought content:   WNL  Sensory/Perceptual disturbances:   WNL  Orientation: oriented to person, place, and situation  Attention: Good  Concentration: Good  Memory: Impaired short term  Fund of knowledge:  Good  Insight:   Good  Judgment:  Good  Impulse Control: Good   Risk Assessment: Danger to Self:  No Self-injurious Behavior: No Danger to Others: No Duty to Warn:no Physical Aggression / Violence:No  Access to Firearms a concern: No  Gang Involvement:No  Treatment plan: Will employ cognitive behavioral therapy as well as grief and loss therapy for relief of anxiety and grief.  Goals of grief therapy or to have a healthier response to the loss of his daughter and less sadness as evidenced by patient report in therapy notes as well as to help him feel less overwhelmed by her loss.  Goals are to see a 50% reduction in grief symptoms over the next 6 months.  I will encourage the use of the patient telling his grief story about his daughter including encouraging him to bring pictures and memorabilia to help process his feelings including any feelings of guilt associated with her loss.  We will use cognitive behavioral therapy to help identify and change anxiety producing thoughts and behavior patterns so as to improve his ability to better manage anxiety  and stress, and manage thoughts and worrisome thinking contributing to anxiety.  We will also refresh and encourage dialectical behavior therapy distress tolerance and mindfulness skills with the intention of reducing anxiety by 50% over the next 6 months. Progress: 30% Interventions: Cognitive Behavioral Therapy  Diagnosis:PTSD, Bi-Polar D/O  Plan: F/U/ appointments scheduled weekly.  Lorrene CHRISTELLA Hasten,  [facility]                                                                                                                   Lorrene CHRISTELLA Hasten, [facility]  [facility] Counselor/Therapist Progress Note  Patient ID: Shandon Burlingame, MRN: [id number],    Date: 04/29/2024 This session was held via video teletherapy. The patient consented to the video teletherapy and was located in his home during this session. He is aware it is the responsibility of the patient to secure confidentiality on his end of the session. The provider was in a private home office for the duration of this session.    The patient arrived on  time for her Caregility session  Time Spent: 58 minutes, 2 PM to 2:58 PM  Treatment Type: Individual Therapy Reported Symptoms: anxiety, panic attacks A fairly difficult week because it was his daughter's birthday.  He said this year was more difficult than last year but he could not pinpoint why.  They did remember her about going out to a nice restaurant which is something her daughter always wanted to do.  He said they were able to laugh and joke about funny things that his daughter had said or done which is not something they were able to do this time last year so he saw that as healthy grieving.  He knows that the time change threw things off a little bit for everybody and his wife still is not feeling well and his back has been bothering him.  He was not able to go to his first round of physical therapy this week because his back was bothering him more.  He has been thinking a lot more about his daughter which he feels comfortable with.  He is thankful that it is getting warmer and staying light longer so he can get back to some of the coping skills such as fishing that he has done in the past.   We will continue to work on coping skills for helping with his anxiety and the progression of his Lewy body dementia diagnosis as well as processing his grief. Mental Status Exam: Appearance:  Well Groomed     Behavior: Appropriate  Motor: Pt. 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  Speech/Language:  Normal Rate  Affect: Appropriate  Mood: normal  Thought process: normal  Thought content:   WNL  Sensory/Perceptual disturbances:   WNL  Orientation: oriented to person, place, and situation  Attention: Good  Concentration: Good  Memory: Impaired short term  Fund of knowledge:  Good  Insight:   Good  Judgment:  Good  Impulse Control: Good   Risk Assessment: Danger to Self:  No Self-injurious Behavior: No Danger to Others: No Duty to Warn:no Physical Aggression / Violence:No   Access to Firearms a concern: No  Gang Involvement:No  Treatment plan: Will employ cognitive behavioral therapy as well as grief and loss therapy for relief of anxiety and grief.  Goals of grief therapy or to have a healthier response to the loss of his daughter and less sadness as evidenced by patient report in therapy notes as well as to help him feel less overwhelmed by her loss.  Goals are to see a 50% reduction in grief symptoms over the next 6 months.  I will encourage the use of the patient telling his grief story about his daughter including encouraging him to bring pictures and memorabilia to help process his feelings including any feelings of guilt associated with her loss.  We will use cognitive behavioral therapy to help identify and change anxiety producing thoughts and behavior patterns so as to improve his ability to better manage anxiety and stress, and manage thoughts  and worrisome thinking contributing to anxiety.  We will also refresh and encourage dialectical behavior therapy distress tolerance and mindfulness skills with the intention of reducing anxiety by 50% over the next 6 months. Progress: 30% Interventions: Cognitive Behavioral Therapy  Diagnosis:PTSD, Bi-Polar D/O  Plan: F/U/ appointments scheduled weekly.  Lorrene CHRISTELLA Hasten, [facility]                                                                                                                   Lorrene CHRISTELLA Hasten, [facility]               Lorrene CHRISTELLA Hasten, [facility]               Lorrene CHRISTELLA Hasten,  [facility]  [facility] Counselor/Therapist Progress Note  Patient ID: Stephen Myers, MRN: [id number],    Date: 04/29/2024 This session was held via video teletherapy. The patient consented to the video teletherapy and was located in his home during this session. He is aware it is the responsibility of the patient to secure confidentiality on his end of the session. The provider was in a private home office for the duration of this session.    The patient arrived on time for her Caregility session  Time Spent: 1:05 PM until 2 PM, 55 minutes Treatment Type: Individual Therapy Reported Symptoms: anxiety, panic attacks The patient said he came in from his walk this morning and saw what he thought was some sort of tic from his wife.  It  was not the first time it happened and he wonders if it might be tardive dyskinesia because of some new medications that she is on.  She has a call into the doctor right now and hopes to hear something back soon.  They have narrowed down to the fact they think most of her discomfort in breathing is coming from the diaphragm and they are looking for alternatives for treatment.  The patient continues to do those things which help him cope including walking, fishing when he can, spending time with his son and daughter-in-law and grandson.  He also has a way of continued coping with grief he is writing letters to his daughters as a way of expressing his feelings and says that is very beneficial.  He appears to be grieving in a healthy way but he knows that he to year anniversary of her death is coming up and there is some grief there but says it is not overwhelming.  We will continue to work on coping skills for helping with his anxiety and the progression of his Lewy body dementia diagnosis as well as processing his grief. 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  Speech/Language:  Normal Rate  Affect: Appropriate  Mood: normal  Thought process: normal  Thought content:   WNL  Sensory/Perceptual disturbances:   WNL  Orientation: oriented to person, place, and situation  Attention: Good  Concentration: Good  Memory: Impaired short term  Fund of knowledge:  Good  Insight:   Good  Judgment:  Good  Impulse Control: Good   Risk Assessment: Danger to Self:  No Self-injurious Behavior: No Danger to Others: No Duty to Warn:no Physical Aggression / Violence:No  Access to Firearms a concern: No  Gang Involvement:No  Treatment plan: Will employ cognitive behavioral therapy as well as grief and loss therapy for relief of anxiety and grief.  Goals of grief therapy or to have a healthier response to the loss  of his daughter and less sadness as evidenced by patient report in therapy notes as well as to help him feel less overwhelmed by her loss.  Goals are to see a 50% reduction in grief symptoms over the next 6 months.  I will encourage the use of the patient telling his grief story about his daughter including encouraging him to bring pictures and memorabilia to help process his feelings including any feelings of guilt associated with her loss.  We will use cognitive behavioral therapy to help identify and change anxiety producing thoughts and behavior patterns so as to improve his ability to better manage anxiety and stress, and manage thoughts and worrisome  thinking contributing to anxiety.  We will also refresh and encourage dialectical behavior therapy distress tolerance and mindfulness skills with the intention of reducing anxiety by 50% over the next 6 months. Progress: 30% Interventions: Cognitive Behavioral Therapy  Diagnosis:PTSD, Bi-Polar D/O  Plan: F/U/ appointments scheduled weekly.  Lorrene CHRISTELLA Hasten, [facility]                                                                                                                   Lorrene CHRISTELLA Hasten,  [facility]  [place] Counselor/Therapist Progress Note  Patient ID: Stephen Myers, MRN: [id number],    Date: 04/29/2024 This session was held via video teletherapy. The patient consented to the video teletherapy and was located in his home during this session. He is aware it is the responsibility of the patient to secure confidentiality on his end of the session. The provider was in a private home office for the duration of this session.    The patient arrived on time for her Caregility session  Time Spent: 59 minutes, 2 PM to 2:59 PM  Treatment Type: Individual Therapy Reported Symptoms: anxiety, panic attacks The patient continues to present with physical pain and both his back and his knee.  He started  with a new physical therapist and told him that there was a lot of arthritis in both knees and or certain things he cannot do but they had him do that anyway and now for the past 3 days he has been in significant knee pain.  He has continued to walk because he knows that is good for him in various ways but says it has been painful.  His wife also was not feeling well but she is going back to the doctor next week to have an ablation and hopes that will bring her some relief.  For the most part he has been at the last couple weeks reaching back out and hearing from some old friends as well as time with his son and son's family.  He says that his wife's cousin is coming down for a weekend soon and they enjoy time with her and that he will go to his brother's house sometime in the next few weeks.  He recognizes the need for people that he cares about in helping with his depression. We will continue to work on coping skills for helping with his anxiety and the progression of his Lewy body dementia diagnosis as well as processing his grief. 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  Speech/Language:  Normal Rate  Affect: Appropriate  Mood: normal  Thought process: normal  Thought content:   WNL  Sensory/Perceptual disturbances:   WNL  Orientation: oriented to person, place, and situation  Attention: Good  Concentration: Good  Memory: Impaired short term  Fund of knowledge:  Good  Insight:   Good  Judgment:  Good  Impulse Control: Good   Risk Assessment: Danger to Self:  No Self-injurious Behavior: No Danger to Others: No Duty to Warn:no Physical Aggression / Violence:No  Access to Firearms a concern: No  Gang Involvement:No  Treatment plan: Will employ cognitive behavioral therapy as well as grief and loss therapy for relief of anxiety and grief.  Goals of grief therapy or to have a healthier response  to the loss of his daughter and less sadness as evidenced by patient report in therapy notes as well as to help him feel less overwhelmed by her loss.  Goals are to see a 50% reduction in grief symptoms over the next 6 months.  I will encourage the use of the patient telling his grief story about his daughter including encouraging him to bring pictures and memorabilia to help process his feelings including any feelings of guilt associated with her loss.  We will use cognitive behavioral therapy to help identify and change anxiety producing thoughts and behavior patterns so as to improve his ability to better  manage anxiety and stress, and manage thoughts and worrisome thinking contributing to anxiety.  We will also refresh and encourage dialectical behavior therapy distress tolerance and mindfulness skills with the intention of reducing anxiety by 50% over the next 6 months. Progress: 30% Interventions: Cognitive Behavioral Therapy  Diagnosis:PTSD, Bi-Polar D/O  Plan: F/U/ appointments scheduled weekly.  Lorrene CHRISTELLA Hasten, [facility]                                                                                                                   Lorrene CHRISTELLA Hasten, [facility]               Lorrene CHRISTELLA Hasten, [facility]               Lorrene CHRISTELLA Hasten, [facility]               Lorrene CHRISTELLA Hasten,  [facility]  [place] [facility] Counselor/Therapist Progress Note  Patient ID: Jasiyah Paulding, MRN: [id number],    Date: 04/29/2024 This session was held via video teletherapy. The patient consented to the video teletherapy and was located in his home during this session. He is aware it is the responsibility of the patient to secure confidentiality on his end of the session. The provider was in a private home office for the duration of this session.    The patient arrived on time for her Caregility session  Time Spent: 57 minutes, 2 PM to 2:57 PM  Treatment Type: Individual Therapy Reported Symptoms: anxiety, panic attacks The patient is still having significant back pain.  He cannot sit anywhere for very long and has to be  careful where he sits.  There is still some unsteadiness although he did not report any falls over the past 2 weeks.  His biggest concern now is for his wife.  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  She is meeting with her cardiologist again next week to see what possible treatment is for her.  He still thinks there may be some issue with her diaphragm but she is meeting with the pulmonologist also.  He is doing what he can to help her.  His daughter's birthday is March 11 so he knows that is a difficult time for both he and his wife next week.  Typically they do something to remember her but because of the wife's current health issues they cannot go to [place] as they had planned so they are talking about what to do to remember and celebrate her birthday.  We will continue to work on coping skills for helping with his anxiety and the progression of his Lewy body dementia diagnosis as well as processing his grief. 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  Speech/Language:  Normal Rate  Affect: Appropriate  Mood: normal  Thought process: normal  Thought content:   WNL  Sensory/Perceptual disturbances:   WNL  Orientation: oriented to person, place, and situation  Attention: Good  Concentration: Good  Memory: Impaired short term  Fund of knowledge:  Good  Insight:   Good  Judgment:  Good  Impulse Control: Good   Risk Assessment: Danger to Self:  No Self-injurious Behavior: No Danger to Others: No Duty to Warn:no Physical Aggression / Violence:No  Access to Firearms a concern: No  Gang Involvement:No  Treatment plan: Will employ cognitive behavioral therapy as well as grief and loss therapy for relief of anxiety and grief.  Goals of grief therapy or to have a healthier response to the loss of his daughter  and less sadness as evidenced by patient report in therapy notes as well as to help him feel less overwhelmed by her loss.  Goals are to see a 50% reduction in grief symptoms over the next 6 months.  I will encourage the use of the patient telling his grief story about his daughter including encouraging him to bring pictures and memorabilia to help process his feelings including any feelings of guilt associated with her loss.  We will use cognitive behavioral therapy to help identify and change anxiety producing thoughts and behavior patterns so as to improve his ability to better manage anxiety and stress, and manage thoughts and worrisome thinking contributing  to anxiety.  We will also refresh and encourage dialectical behavior therapy distress tolerance and mindfulness skills with the intention of reducing anxiety by 50% over the next 6 months. Progress: 30% Interventions: Cognitive Behavioral Therapy  Diagnosis:PTSD, Bi-Polar D/O  Plan: F/U/ appointments scheduled weekly.  Lorrene CHRISTELLA Hasten, [facility]                                                                                                                   Lorrene CHRISTELLA Hasten,  [facility]  [facility] Counselor/Therapist Progress Note  Patient ID: Colbey Wirtanen, MRN: [id number],    Date: 04/29/2024 This session was held via video teletherapy. The patient consented to the video teletherapy and was located in his home during this session. He is aware it is the responsibility of the patient to secure confidentiality on his end of the session. The provider was in a private home office for the duration of this session.    The patient arrived on time for her Caregility session  Time Spent: 58 minutes, 2 PM to 2:58 PM  Treatment Type: Individual Therapy Reported Symptoms: anxiety, panic attacks A fairly difficult week because it was his daughter's birthday.  He said this year was more  difficult than last year but he could not pinpoint why.  They did remember her about going out to a nice restaurant which is something her daughter always wanted to do.  He said they were able to laugh and joke about funny things that his daughter had said or done which is not something they were able to do this time last year so he saw that as healthy grieving.  He knows that the time change threw things off a little bit for everybody and his wife still is not feeling well and his back has been bothering him.  He was not able to go to his first round of physical therapy this week because his back was bothering him more.  He has been thinking a lot more about his daughter which he feels comfortable with.  He is thankful that it is getting warmer and staying light longer so he can get back to some of the coping skills such as fishing that he has done in the past.   We will continue to work on coping skills for helping with his anxiety and the progression of his Lewy body dementia diagnosis as well as processing his grief. 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  Speech/Language:  Normal Rate  Affect: Appropriate  Mood: normal  Thought process: normal  Thought content:   WNL  Sensory/Perceptual disturbances:   WNL  Orientation: oriented to person, place, and situation  Attention: Good  Concentration: Good  Memory: Impaired short term  Fund of knowledge:  Good  Insight:   Good  Judgment:  Good  Impulse Control: Good   Risk Assessment: Danger to Self:  No Self-injurious Behavior: No Danger to Others: No Duty to Warn:no Physical Aggression / Violence:No  Access to Firearms a concern: No  Gang Involvement:No  Treatment plan: Will employ cognitive behavioral therapy as well as grief and loss therapy for relief of anxiety and grief.  Goals of grief therapy or to have a healthier response to the loss  of his daughter and less sadness as evidenced by patient report in therapy notes as well as to help him feel less overwhelmed by her loss.  Goals are to see a 50% reduction in grief symptoms over the next 6 months.  I will encourage the use of the patient telling his grief story about his daughter including encouraging him to bring pictures and memorabilia to help process his feelings including any feelings of guilt associated with her loss.  We will use cognitive behavioral therapy to help identify and change anxiety producing thoughts and behavior patterns so as to improve his ability to better manage anxiety and stress, and manage thoughts  and worrisome thinking contributing to anxiety.  We will also refresh and encourage dialectical behavior therapy distress tolerance and mindfulness skills with the intention of reducing anxiety by 50% over the next 6 months. Progress: 30% Interventions: Cognitive Behavioral Therapy  Diagnosis:PTSD, Bi-Polar D/O  Plan: F/U/ appointments scheduled weekly.  Lorrene CHRISTELLA Hasten, [facility]                                                                                                                   Lorrene CHRISTELLA Hasten, [facility]               Lorrene CHRISTELLA Hasten, [facility]               Lorrene CHRISTELLA Hasten,  [facility]  [place] [facility] Counselor/Therapist Progress Note  Patient ID: Goro Wenrick, MRN: [id number],    Date: 04/29/2024 This session was held via video teletherapy. The patient consented to the video teletherapy and was located in his home during this session. He is aware it is the responsibility of the patient to secure confidentiality on his end of the session. The provider was in a private home office for the duration of this session.    The patient arrived on time for her Caregility session  Time Spent: 57 minutes, 2 PM to 2:57 PM  Treatment Type: Individual Therapy Reported Symptoms: anxiety, panic attacks The patient is still having significant back pain.  He cannot sit anywhere for very long and has to be  careful where he sits.  There is still some unsteadiness although he did not report any falls over the past 2 weeks.  His biggest concern now is for his wife.  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  She is meeting with her cardiologist again next week to see what possible treatment is for her.  He still thinks there may be some issue with her diaphragm but she is meeting with the pulmonologist also.  He is doing what he can to help her.  His daughter's birthday is March 11 so he knows that is a difficult time for both he and his wife next week.  Typically they do something to remember her but because of the wife's current health issues they cannot go to [place] as they had planned so they are talking about what to do to remember and celebrate her birthday.  We will continue to work on coping skills for helping with his anxiety and the progression of his Lewy body dementia diagnosis as well as processing his grief. 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  Speech/Language:  Normal Rate  Affect: Appropriate  Mood: normal  Thought process: normal  Thought content:   WNL  Sensory/Perceptual disturbances:   WNL  Orientation: oriented to person, place, and situation  Attention: Good  Concentration: Good  Memory: Impaired short term  Fund of knowledge:  Good  Insight:   Good  Judgment:  Good  Impulse Control: Good   Risk Assessment: Danger to Self:  No Self-injurious Behavior: No Danger to Others: No Duty to Warn:no Physical Aggression / Violence:No  Access to Firearms a concern: No  Gang Involvement:No  Treatment plan: Will employ cognitive behavioral therapy as well as grief and loss therapy for relief of anxiety and grief.  Goals of grief therapy or to have a healthier response to the loss of his daughter  and less sadness as evidenced by patient report in therapy notes as well as to help him feel less overwhelmed by her loss.  Goals are to see a 50% reduction in grief symptoms over the next 6 months.  I will encourage the use of the patient telling his grief story about his daughter including encouraging him to bring pictures and memorabilia to help process his feelings including any feelings of guilt associated with her loss.  We will use cognitive behavioral therapy to help identify and change anxiety producing thoughts and behavior patterns so as to improve his ability to better manage anxiety and stress, and manage thoughts and worrisome thinking contributing  to anxiety.  We will also refresh and encourage dialectical behavior therapy distress tolerance and mindfulness skills with the intention of reducing anxiety by 50% over the next 6 months. Progress: 30% Interventions: Cognitive Behavioral Therapy  Diagnosis:PTSD, Bi-Polar D/O  Plan: F/U/ appointments scheduled weekly.  Lorrene CHRISTELLA Hasten, [facility]                                                                                                                   Lorrene CHRISTELLA Hasten,  [facility]  [facility] Counselor/Therapist Progress Note  Patient ID: Zabdiel Dripps, MRN: [id number],    Date: 04/29/2024 This session was held via video teletherapy. The patient consented to the video teletherapy and was located in his home during this session. He is aware it is the responsibility of the patient to secure confidentiality on his end of the session. The provider was in a private home office for the duration of this session.    The patient arrived on time for her Caregility session  Time Spent: 57 minutes, 2 PM to 2:57 PM  Treatment Type: Individual Therapy Reported Symptoms: anxiety, panic attacks  We will continue to work on coping skills for helping with his anxiety and the progression of  his Lewy body dementia diagnosis as well as processing his grief. Mental Status Exam: Appearance:  Well Groomed     Behavior: Appropriate  Motor: Pt. Reports some instability due to Lewey Body dementia  Speech/Language:  Normal Rate  Affect: Appropriate  Mood: normal  Thought process: normal  Thought content:   WNL  Sensory/Perceptual disturbances:   WNL  Orientation: oriented to person, place, and situation  Attention: Good  Concentration: Good  Memory: Impaired short term  Fund of knowledge:  Good  Insight:   Good  Judgment:  Good  Impulse Control: Good   Risk Assessment: Danger to Self:  No Self-injurious Behavior: No Danger to Others: No Duty to Warn:no Physical Aggression / Violence:No  Access to Firearms a concern: No  Gang Involvement:No  Treatment plan: Will employ cognitive behavioral therapy as well as grief and loss therapy for relief of anxiety and grief.  Goals of grief therapy or to have a healthier response to the loss of his daughter and less sadness as evidenced by patient report in therapy notes as well as to help him feel less overwhelmed by her loss.  Goals are to see a 50% reduction in grief symptoms over the next 6 months.  I will encourage the use of the patient telling his grief story about his daughter including encouraging him to bring pictures and memorabilia to help process his feelings including any feelings of guilt associated with her loss.  We will use cognitive behavioral therapy to help identify and change anxiety producing thoughts and behavior patterns so as to improve his ability to better manage anxiety and stress, and manage thoughts and worrisome thinking contributing to anxiety.  We will also refresh and encourage dialectical behavior therapy distress tolerance and mindfulness skills with the intention of reducing anxiety by 50% over the next 6 months. Progress: 30% Interventions: Cognitive Behavioral Therapy  Diagnosis:PTSD, Bi-Polar  D/O  Plan: F/U/ appointments scheduled weekly.  Lorrene CHRISTELLA Hasten, [facility]                                                                                                                   Lorrene CHRISTELLA Hasten, [facility]               Lorrene CHRISTELLA Hasten, [facility]  Lorrene CHRISTELLA Hasten, [facility]               Lorrene CHRISTELLA Hasten, [facility]               Lorrene CHRISTELLA Hasten, [facility]               Lorrene CHRISTELLA Hasten, [facility]               Lorrene CHRISTELLA Hasten, [facility]               Lorrene CHRISTELLA Hasten, [facility]               Lorrene CHRISTELLA Hasten, [facility]               Lorrene CHRISTELLA Hasten, [facility]

## 2024-04-29 NOTE — Progress Notes (Addendum)
 [facility] Counselor/Therapist Progress Note  Patient ID: Stephen Myers, MRN: [id number],    Date: 04/08/24 This session was held via video teletherapy. The patient consented to the video teletherapy and was located in his home during this session. He is aware it is the responsibility of the patient to secure confidentiality on his end of the session. The provider was in a private home office for the duration of this session.    The patient arrived on time for his Caregility session  Time Spent: 57 minutes, 2 PM to 2:57 PM  Treatment Type: Individual Therapy Reported Symptoms: anxiety, panic attacks Patient reports that his wife continues to improve from her surgery but is limited in how much she can do.  For the most part he physically has been fairly stable.  There have been no issues of him falling.  He continues to walk daily and depending on the heat and humidity affects how much he walks but he is still walking consistently.  He says he still has some aches and pains but for the most part is working through the medical as much as he can.  There is still difficulty with short-term memory but he understands that is part of the diagnosis.  He continues to spend time with his son and son's family as well as his brother and sister-in-law and enjoys that much. We will continue to work on coping skills for helping with his anxiety and the progression of his Lewy body dementia diagnosis as well as processing his grief. Mental Status Exam: Appearance:  Well Groomed     Behavior: Appropriate  Motor: Pt. Reports some instability due to Lewey Body dementia  Speech/Language:  Normal Rate  Affect: Appropriate  Mood: normal  Thought process: normal  Thought content:   WNL  Sensory/Perceptual disturbances:   WNL  Orientation: oriented to person, place, and situation  Attention: Good  Concentration: Good  Memory: Impaired short term  Fund of knowledge:  Good  Insight:   Good   Judgment:  Good  Impulse Control: Good   Risk Assessment: Danger to Self:  No Self-injurious Behavior: No Danger to Others: No Duty to Warn:no Physical Aggression / Violence:No  Access to Firearms a concern: No  Gang Involvement:No  Treatment plan: Will employ cognitive behavioral therapy as well as grief and loss therapy for relief of anxiety and grief.  Goals of grief therapy or to have a healthier response to the loss of his daughter and less sadness as evidenced by patient report in therapy notes as well as to help him feel less overwhelmed by her loss.  Goals are to see a 50% reduction in grief symptoms with a target date of August 02, 2024.  I will encourage the use of the patient telling his grief story about his daughter including encouraging him to bring pictures and memorabilia to help process his feelings including any feelings of guilt associated with her loss.  We will use cognitive behavioral therapy to help identify and change anxiety producing thoughts and behavior patterns so as to improve his ability to better manage anxiety and stress, and manage thoughts and worrisome thinking contributing to anxiety.  We will also refresh and encourage dialectical behavior therapy distress tolerance and mindfulness skills with the intention of reducing anxiety by 50% over the next 6 months. Progress: 35% with a target date of August 02, 2024 Interventions: Cognitive Behavioral Therapy  Diagnosis:PTSD, Bi-Polar D/O  Plan: F/U/ appointments scheduled weekly.  Stephen Myers  Stephen Myers Stephen Myers              Stephen Myers, Stephen Myers              Stephen Myers, Stephen Myers

## 2024-04-29 NOTE — Progress Notes (Addendum)
 [facility] Counselor/Therapist Progress Note  Patient ID: Stephen Myers, MRN: [id number],    Date: 04/01/2024  This session was held via video teletherapy. The patient consented to the video teletherapy and was located in his home during this session. He is aware it is the responsibility of the patient to secure confidentiality on his end of the session. The provider was in a private home office for the duration of this session.    The patient arrived on time for his Caregility session  Time Spent: 57 minutes, 2 PM to 2:57 PM  Treatment Type: Individual Therapy Reported Symptoms: anxiety, panic attacks The patient reported that he had been to the neurologist again reported no significant changes in the progression of his Lewy body dementia.  He reports that he has not been as unsteady on his feet.  He continues to walk in his neighborhood as much as he can.  He is keeping his mind as busy as he can.  He is spending time with his wife.  He feels that he is grieving in appropriate way.  The second anniversary of his daughter's death will be in 2024-05-21 of this year so that is always a difficult time for the patient.  He and his wife typically do something in remembrance of her getting away from home and going somewhere but he is not sure what they will do this year.  I encouraged continued use of coping skills for stress and anxiety.  He also spends time with his brothers as often as he can including his son and daughter-in-law and grandson which brings him joy. We will continue to work on coping skills for helping with his anxiety and the progression of his Lewy body dementia diagnosis as well as processing his grief. Mental Status Exam: Appearance:  Well Groomed    Behavior: Appropriate Motor: Pt. Reports some instability due to Lewey Body dementia Speech/Language:  Normal Rate Affect: Appropriate Mood: normal Thought process: normal Thought content:   WNL Sensory/Perceptual  disturbances:   WNL Orientation: oriented to person, place, and situation Attention: Good Concentration: Good Memory: Impaired short term Fund of knowledge:  Good Insight:   Good Judgment:  Good Impulse Control: Good  Risk Assessment: Danger to Self:  No Self-injurious Behavior: No Danger to Others: No Duty to Warn:no Physical Aggression / Violence:No  Access to Firearms a concern: No  Gang Involvement:No  Treatment plan: Will employ cognitive behavioral therapy as well as grief and loss therapy for relief of anxiety and grief.  Goals of grief therapy or to have a healthier response to the loss of his daughter and less sadness as evidenced by patient report in therapy notes as well as to help him feel less overwhelmed by her loss.  Goals are to see a 50% reduction in grief symptoms over the next 6 months.  I will encourage the use of the patient telling his grief story about his daughter including encouraging him to bring pictures and memorabilia to help process his feelings including any feelings of guilt associated with her loss.  We will use cognitive behavioral therapy to help identify and change anxiety producing thoughts and behavior patterns so as to improve his ability to better manage anxiety and stress, and manage thoughts and worrisome thinking contributing to anxiety.  We will also refresh and encourage dialectical behavior therapy distress tolerance and mindfulness skills with the intention of reducing anxiety by 50% over the next 6 months. Progress: 30% Interventions: Cognitive Behavioral Therapy  Diagnosis:PTSD, Bi-Polar D/O  Plan: F/U/ appointments scheduled weekly.  Lorrene CHRISTELLA Hasten, [facility]

## 2024-05-12 NOTE — Progress Notes (Addendum)
 [facility] Counselor/Therapist Progress Note  Patient ID: Terre Zabriskie, MRN: [id number],    Date: 04/15/24 This session was held via video teletherapy. The patient consented to the video teletherapy and was located in his home during this session. He is aware it is the responsibility of the patient to secure confidentiality on his end of the session. The provider was in a private home office for the duration of this session.    The patient arrived on time for his Caregility session  Time Spent: 57 minutes, 2 PM to 2:57 PM  Treatment Type: Individual Therapy Reported Symptoms: anxiety, panic attacks We are approaching a difficult time for the patient as that is his and his wife's birthday within a few days but also the anniversary of his daughter's death.  They try to do something around the time of his daughters that things are going somewhere doing something in her memory.  He still continues to journal and says that has been very beneficial to him in terms of his grief.  He continues to use coping skills especially as the weather is not sure to get outside some walking, chipping and building balls and/or fishing with his wife as much as they can.  He is using his coping skills as well as cognitive reframing when anxious thoughts occur. We will continue to work on coping skills for helping with his anxiety and the progression of his Lewy body dementia diagnosis as well as processing his grief. Mental Status Exam: Appearance:  Well Groomed     Behavior: Appropriate  Motor: Pt. Reports some instability due to Lewey Body dementia  Speech/Language:  Normal Rate  Affect: Appropriate  Mood: normal  Thought process: normal  Thought content:   WNL  Sensory/Perceptual disturbances:   WNL  Orientation: oriented to person, place, and situation  Attention: Good  Concentration: Good  Memory: Impaired short term  Fund of knowledge:  Good  Insight:   Good  Judgment:  Good   Impulse Control: Good   Risk Assessment: Danger to Self:  No Self-injurious Behavior: No Danger to Others: No Duty to Warn:no Physical Aggression / Violence:No  Access to Firearms a concern: No  Gang Involvement:No  Treatment plan: Will employ cognitive behavioral therapy as well as grief and loss therapy for relief of anxiety and grief.  Goals of grief therapy or to have a healthier response to the loss of his daughter and less sadness as evidenced by patient report in therapy notes as well as to help him feel less overwhelmed by her loss.  Goals are to see a 50% reduction in grief symptoms with a target date of Sepember 30, 2025.  I will encourage the use of the patient telling his grief story about his daughter including encouraging him to bring pictures and memorabilia to help process his feelings including any feelings of guilt associated with her loss.  We will use cognitive behavioral therapy to help identify and change anxiety producing thoughts and behavior patterns so as to improve his ability to better manage anxiety and stress, and manage thoughts and worrisome thinking contributing to anxiety.  We will also refresh and encourage dialectical behavior therapy distress tolerance and mindfulness skills with the intention of reducing anxiety by 50% over the next 6 months. Progress: 40% Interventions: Cognitive Behavioral Therapy  Diagnosis:PTSD, Bi-Polar D/O  Plan: F/U/ appointments scheduled weekly.  Lorrene CHRISTELLA Hasten, [facility]              Lorrene CHRISTELLA Hasten,  [facility]

## 2024-05-13 ENCOUNTER — Ambulatory Visit: Admitting: Behavioral Health

## 2024-05-20 ENCOUNTER — Ambulatory Visit: Admitting: Behavioral Health

## 2024-05-27 ENCOUNTER — Ambulatory Visit: Admitting: Behavioral Health

## 2024-06-03 ENCOUNTER — Ambulatory Visit: Admitting: Behavioral Health

## 2024-06-10 ENCOUNTER — Ambulatory Visit: Admitting: Behavioral Health

## 2024-06-17 ENCOUNTER — Ambulatory Visit: Admitting: Behavioral Health

## 2024-06-24 ENCOUNTER — Ambulatory Visit: Admitting: Behavioral Health

## 2024-07-01 ENCOUNTER — Ambulatory Visit: Admitting: Behavioral Health

## 2024-08-08 ENCOUNTER — Encounter: Payer: Self-pay | Admitting: Behavioral Health

## 2024-08-08 ENCOUNTER — Ambulatory Visit: Admitting: Behavioral Health

## 2024-08-08 DIAGNOSIS — F319 Bipolar disorder, unspecified: Secondary | ICD-10-CM

## 2024-08-08 DIAGNOSIS — F4310 Post-traumatic stress disorder, unspecified: Secondary | ICD-10-CM | POA: Diagnosis not present

## 2024-08-08 DIAGNOSIS — F3181 Bipolar II disorder: Secondary | ICD-10-CM

## 2024-08-08 NOTE — Progress Notes (Signed)
 [facility] Counselor/Therapist Progress Note  Patient ID: Stephen Myers, MRN: [id number],    Date: 08/08/24 This session was held face-to-face in the outpatient therapist office.   Time Spent: 12:02 PM until 1:00 PM., 58 minutes Treatment Type: Individual Therapy Reported Symptoms: anxiety, panic attacks The patient has decided to start spending a few days at the beginning of each month with his brother and sister-in-law which he is enjoying doing.  That allows us  to meet face-to-face.  He reports some progression of the Lewy body dementia but will be meeting with the neurologist this afternoon that he used to see.  He did not have a good connection with the neurologist that he was seeing in [place] and is looking forward to today's appointment.  He continues to walk workout with resistance bands go fishing with his wife and he can occasionally chip and pot some golf balls as he feels physically able.  He reports that he has been more physically stable which he appreciates.  His wife has had some medical issues and broke her foot recently and will also have to have some surgery.  He still maintains a good relationship with his son and daughter-in-law and grandson and appreciates having them living close by.  He continues to grieve in a healthy way.  He is journaling and in   a way he is talking to his daughter.  He reports that the think he still struggles with this that he allowed her to stay in the apartment that he said he feels led to her getting bad drugs and overdosing.  He recognizes that no matter where she would have been she would have gotten that if she wanted it and then in that moment they made the best decision that they could in terms of where their daughter lived.  Encouraged him to consider writing that to her. We will continue to work on coping skills for helping with his anxiety and the progression of his Lewy body dementia diagnosis as well as processing his  grief.  He has had a couple of panic attacks recently especially while driving.  One of the times he did use a coping skill which she has not used in a while which she did get some relief from.  I will reemphasize some of those coping skills over the next few sessions. Mental Status Exam: Appearance:  Well Groomed     Behavior: Appropriate  Motor: Pt. Reports some instability due to Lewey Body dementia  Speech/Language:  Normal Rate  Affect: Appropriate  Mood: normal  Thought process: normal  Thought content:   WNL  Sensory/Perceptual disturbances:   WNL  Orientation: oriented to person, place, and situation  Attention: Good  Concentration: Good  Memory: Impaired short term  Fund of knowledge:  Good  Insight:   Good  Judgment:  Good  Impulse Control: Good   Risk Assessment: Danger to Self:  No Self-injurious Behavior: No Danger to Others: No Duty to Warn:no Physical Aggression / Violence:No  Access to Firearms a concern: No  Gang Involvement:No  Treatment plan: Will employ cognitive behavioral therapy as well as grief and loss therapy for relief of anxiety and grief.  Goals of grief therapy or to have a healthier response to the loss of his daughter and less sadness as evidenced by patient report in therapy notes as well as to help him feel less overwhelmed by her loss.  Goals are to see a 50% reduction in grief symptoms with a target  date of March 31st, 2026I will encourage the use of the patient telling his grief story about his daughter including encouraging him to bring pictures and memorabilia to help process his feelings including any feelings of guilt associated with her loss.  We will use cognitive behavioral therapy to help identify and change anxiety producing thoughts and behavior patterns so as to improve his ability to better manage anxiety and stress, and manage thoughts and worrisome thinking contributing to anxiety.  We will also refresh and encourage dialectical behavior  therapy distress tolerance and mindfulness skills with the intention of reducing anxiety by 50% over the next 6 months. Progress: 40% Interventions: Cognitive Behavioral Therapy  Diagnosis:PTSD, Bi-Polar D/O  Plan: F/U/ appointments scheduled monthly  Lorrene CHRISTELLA Hasten, [facility]              Lorrene CHRISTELLA Hasten, [facility]               Lorrene CHRISTELLA Hasten, [facility]

## 2024-08-12 ENCOUNTER — Ambulatory Visit: Admitting: Behavioral Health

## 2024-08-16 ENCOUNTER — Ambulatory Visit: Admitting: Behavioral Health

## 2024-08-19 ENCOUNTER — Ambulatory Visit: Admitting: Behavioral Health

## 2024-08-26 ENCOUNTER — Ambulatory Visit: Admitting: Behavioral Health

## 2024-09-02 ENCOUNTER — Ambulatory Visit: Admitting: Behavioral Health

## 2024-09-05 ENCOUNTER — Ambulatory Visit: Admitting: Behavioral Health

## 2024-09-05 ENCOUNTER — Encounter: Payer: Self-pay | Admitting: Behavioral Health

## 2024-09-05 DIAGNOSIS — F319 Bipolar disorder, unspecified: Secondary | ICD-10-CM | POA: Diagnosis not present

## 2024-09-05 DIAGNOSIS — F4310 Post-traumatic stress disorder, unspecified: Secondary | ICD-10-CM

## 2024-09-05 DIAGNOSIS — F3181 Bipolar II disorder: Secondary | ICD-10-CM

## 2024-09-05 NOTE — Progress Notes (Signed)
 [facility] Counselor/Therapist Progress Note  Patient ID: Stephen Myers, MRN: [id number],    Date: 09/05/24 This session was held face-to-face in the outpatient therapist office. The patient reports a significant increase in anxiety over the past 2 weeks.  He reports that he is more jittery and more easily distracted.  He is psychiatrist recommended an increase in dosage of Pristiq and he has seen a parallel with the increase in Pristiq as well as the increase in anxiety.  Pristiq has been helpful in the past but he does not feel it is beneficial now and the increase made the anxiety worse so he is reaching out to his psychiatrist.  He feels he did well on Klonopin  as needed previously and is going to ask about that possibility also.  He also reports starting to see a little more times where he is disoriented.  Typically if he gives himself a few minutes he can reorient.  It is even happen a couple times while he is walking in the morning but he is very familiar with the route that he takes and it stays the same every day so he usually is fine with only a few minutes of being disoriented.  He also starts to see some signs of what he described as sundowning.  He realizes that he is a little less sharp in the evening hours.  He recognizes as a sign of the Lewy body dementia.  His wife is having foot surgery next week so he is somewhat anxious about that but knows that is what she needs to do and his son and daughter-in-law can help out as need be.  He did write the letter to his daughter with some of the regrets that he has had and says he was in a very emotional exercise but he felt better after doing it.  He says he still feels her presents and felt her presence especially in that process.  Time Spent: 12:00 PM until 12:59 PM., 59 minutes Treatment Type: Individual Therapy Reported Symptoms: anxiety, panic attacks Mental Status Exam: Appearance:  Well Groomed     Behavior:  Appropriate  Motor: Pt. Reports some instability due to Lewey Body dementia  Speech/Language:  Normal Rate  Affect: Appropriate  Mood: normal  Thought process: normal  Thought content:   WNL  Sensory/Perceptual disturbances:   WNL  Orientation: oriented to person, place, and situation  Attention: Good  Concentration: Good  Memory: Impaired short term  Fund of knowledge:  Good  Insight:   Good  Judgment:  Good  Impulse Control: Good   Risk Assessment: Danger to Self:  No Self-injurious Behavior: No Danger to Others: No Duty to Warn:no Physical Aggression / Violence:No  Access to Firearms a concern: No  Gang Involvement:No  Treatment plan: Will employ cognitive behavioral therapy as well as grief and loss therapy for relief of anxiety and grief.  Goals of grief therapy or to have a healthier response to the loss of his daughter and less sadness as evidenced by patient report in therapy notes as well as to help him feel less overwhelmed by her loss.  Goals are to see a 50% reduction in grief symptoms with a target date of March 31st, 2026I will encourage the use of the patient telling his grief story about his daughter including encouraging him to bring pictures and memorabilia to help process his feelings including any feelings of guilt associated with her loss.  We will use cognitive behavioral therapy to help  identify and change anxiety producing thoughts and behavior patterns so as to improve his ability to better manage anxiety and stress, and manage thoughts and worrisome thinking contributing to anxiety.  We will also refresh and encourage dialectical behavior therapy distress tolerance and mindfulness skills with the intention of reducing anxiety by 50% over the next 6 months. Progress: 40% Interventions: Cognitive Behavioral Therapy  Diagnosis:PTSD, Bi-Polar D/O  Plan: F/U/ appointments scheduled monthly  Lorrene CHRISTELLA Hasten, [facility]              Lorrene CHRISTELLA Hasten,  [facility]               Lorrene CHRISTELLA Hasten, [facility]               Lorrene CHRISTELLA Hasten, [facility]

## 2024-09-09 ENCOUNTER — Ambulatory Visit: Admitting: Behavioral Health

## 2024-09-16 ENCOUNTER — Ambulatory Visit: Admitting: Behavioral Health

## 2024-09-23 ENCOUNTER — Ambulatory Visit: Admitting: Behavioral Health

## 2024-09-30 ENCOUNTER — Ambulatory Visit: Admitting: Behavioral Health

## 2024-10-03 ENCOUNTER — Ambulatory Visit: Admitting: Behavioral Health

## 2024-10-03 ENCOUNTER — Encounter: Payer: Self-pay | Admitting: Behavioral Health

## 2024-10-03 DIAGNOSIS — F3181 Bipolar II disorder: Secondary | ICD-10-CM | POA: Diagnosis not present

## 2024-10-03 DIAGNOSIS — F4310 Post-traumatic stress disorder, unspecified: Secondary | ICD-10-CM

## 2024-10-03 NOTE — Progress Notes (Signed)
 [facility] Counselor/Therapist Progress Note  Patient ID: Stephen Myers, MRN: [id number],    Date: 10/03/24 Time Spent: 10 AM until 10:58 AM, 58 minutes spent via a care agility telehealth visit.  The patient was at home and this therapist was in his outpatient therapy office.  The patient consented to the telehealth visit and is aware of its limitations. Since tapering down on the Pristiq and starting Klonopin  his anxiety has gone down significantly.  He is thankful that he feels so much better.  The patient and his wife have decided to move back closer to the area where they lived previously.  They just felt like the care they received was better in that area and they will be closer to his wife's family as well as his brothers and their families.  The downside is that they will be moving away from where his son and daughter-in-law and grandson live.  It was a difficult decision but it was made with his and his wife's care in mind.  He said he talked to his son about it and his only response was that he wanted them to be happy.  He appreciated that but he feels like there are more that he needs to say so he is contemplating a second conversation as well as his brother speaking to his son because he said they are very close also.  In a previous session I asked him to think about writing a letter to his daughter because he had said multiple times he had regrets about things that he had not said was things that he had done especially over the past few years of her life where she was in addiction and he was struggling with mental health issues.  He said it was difficult to write for therapeutic but he has to be able to read it in session which he did and start some more emotions.  We processed some of the things that he said and that he felt but did indicate that it felt good to be able to save him even if on paper.  He recognized that he was in a very difficult place and made decisions for  he and his daughters relationship as well as her wellbeing as best as he could knowing what she was going through a choice that she was making and where he was. He does contract for safety having no thoughts of hurting himself or anyone else.  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  Speech/Language:  Normal Rate  Affect: Appropriate  Mood: normal  Thought process: normal  Thought content:   WNL  Sensory/Perceptual disturbances:   WNL  Orientation: oriented to person, place, and situation  Attention: Good  Concentration: Good  Memory: Impaired short term  Fund of knowledge:  Good  Insight:   Good  Judgment:  Good  Impulse Control: Good   Risk Assessment: Danger to Self:  No Self-injurious Behavior: No Danger to Others: No Duty to Warn:no Physical Aggression / Violence:No  Access to Firearms a concern: No  Gang Involvement:No  Treatment plan: Will employ cognitive behavioral therapy as well as grief and loss therapy for relief of anxiety and grief.  Goals of grief therapy or to have a healthier response to the loss of his daughter and less sadness as evidenced by patient report in therapy  notes as well as to help him feel less overwhelmed by her loss.  Goals are to see a 50% reduction in grief symptoms with a target date of March 31st, 2026 will encourage the use of the patient telling his grief story about his daughter including encouraging him to bring pictures and memorabilia to help process his feelings including any feelings of guilt associated with her loss.  We will use cognitive behavioral therapy to help identify and change anxiety producing thoughts and behavior patterns so as to improve his ability to better manage anxiety and stress, and manage thoughts and worrisome thinking contributing to anxiety.  We  will also refresh and encourage dialectical behavior therapy distress tolerance and mindfulness skills with the intention of reducing anxiety by 50% over the next 6 months. Progress: 40% Interventions: Cognitive Behavioral Therapy  Diagnosis:PTSD, Bi-Polar D/O  Plan: F/U/ appointments scheduled monthly  Lorrene CHRISTELLA Hasten, [facility]              Lorrene CHRISTELLA Hasten, [facility]               Lorrene CHRISTELLA Hasten, [facility]               Lorrene CHRISTELLA Hasten, [facility]               Lorrene CHRISTELLA Hasten, [facility]

## 2024-10-30 ENCOUNTER — Encounter: Payer: Self-pay | Admitting: Behavioral Health

## 2024-11-07 ENCOUNTER — Ambulatory Visit: Admitting: Behavioral Health

## 2024-11-07 ENCOUNTER — Encounter: Payer: Self-pay | Admitting: Behavioral Health

## 2024-11-07 DIAGNOSIS — F3181 Bipolar II disorder: Secondary | ICD-10-CM

## 2024-11-07 DIAGNOSIS — F4310 Post-traumatic stress disorder, unspecified: Secondary | ICD-10-CM | POA: Diagnosis not present

## 2024-11-07 NOTE — Progress Notes (Signed)
 " [facility] Counselor/Therapist Progress Note  Patient ID: Roshard Rezabek, MRN: [id number],    Date: 11/07/24 Time Spent: [place] oh 1 AM until 11:58 AM, 57 minutes spent via a care agility telehealth visit.  The patient was at home and this therapist was in his outpatient therapy office.  The patient consented to the telehealth visit and is aware of its limitations. The patient and his family had a good Christmas.  He was able to spend some time with his son and his son's family as well as his brothers and their families.  They did find a townhome back in this area which they have already signed the contract for.  [place] of [facility] and he feels like it was meant to be.  It is a good layout for them and still fairly close to both of them he and his wife's family members.  They will be further away from son and daughter-in-law but he found out recently that daughter-in-law is interviewing for a job in a city about an hour away from where the patient is moving to.  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  Feels like moving back to this area is the best thing for he and his wife.  For the most part he feels that his anxiety has been manageable.  He reports no significant changes in his Lewy body dementia other than some increased irritability which his wife pointed out to him.  We talked about some soothing/coping skills to help with that as well as working on some mindfulness exercises to help him notice what it might be triggering some of the irritability.  He continues to walk and exercise on a regular basis. He does contract for safety having no thoughts of hurting himself or anyone else.  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   Speech/Language:  Normal Rate  Affect: Appropriate  Mood: normal  Thought process: normal  Thought content:   WNL  Sensory/Perceptual disturbances:   WNL  Orientation: oriented to person, place, and situation  Attention: Good  Concentration: Good  Memory: Impaired short term  Fund of knowledge:  Good  Insight:   Good  Judgment:  Good  Impulse Control: Good   Risk Assessment: Danger to Self:  No Self-injurious Behavior: No Danger to Others: No Duty to Warn:no Physical Aggression / Violence:No  Access to Firearms a concern: No  Gang Involvement:No  Treatment plan: Will employ cognitive behavioral therapy as well as grief and loss therapy for relief of anxiety and grief.  Goals of grief therapy or to have a healthier response to the loss of his daughter and less sadness as evidenced by patient report in therapy notes as well as to help him feel less overwhelmed by her loss.  Goals are to see a 50% reduction in grief symptoms with a target date of March 31st, 2026 will encourage the use of the patient telling his grief story about his daughter including encouraging him to bring pictures and memorabilia to help process his feelings including any feelings of guilt associated with her loss.  We will use cognitive behavioral therapy to help identify and change anxiety producing thoughts and behavior patterns so as to improve his ability to better manage  anxiety and stress, and manage thoughts and worrisome thinking contributing to anxiety.  We will also refresh and encourage dialectical behavior therapy distress tolerance and mindfulness skills with the intention of reducing anxiety by 50% over the next 6 months. Progress: 40% Interventions: Cognitive Behavioral Therapy  Diagnosis:PTSD, Bi-Polar D/O  Plan: F/U/ appointments scheduled monthly  Lorrene CHRISTELLA Hasten, [facility]              Lorrene CHRISTELLA Hasten, [facility]               Lorrene CHRISTELLA Hasten,  [facility]               Lorrene CHRISTELLA Hasten, [facility]               Lorrene CHRISTELLA Hasten, [facility]               Lorrene CHRISTELLA Hasten, [facility] "

## 2024-12-06 ENCOUNTER — Ambulatory Visit: Admitting: Behavioral Health

## 2024-12-06 ENCOUNTER — Encounter: Payer: Self-pay | Admitting: Behavioral Health
